# Patient Record
Sex: Female | Born: 1995
Health system: Southern US, Community
[De-identification: ages and names within clinical notes are randomized; demographics above are authoritative.]

## PROBLEM LIST (undated history)

## (undated) ENCOUNTER — Inpatient Hospital Stay (HOSPITAL_COMMUNITY): Payer: Self-pay

## (undated) DIAGNOSIS — G4730 Sleep apnea, unspecified: Secondary | ICD-10-CM

## (undated) DIAGNOSIS — F32A Depression, unspecified: Secondary | ICD-10-CM

## (undated) DIAGNOSIS — R519 Headache, unspecified: Secondary | ICD-10-CM

## (undated) DIAGNOSIS — T4145XA Adverse effect of unspecified anesthetic, initial encounter: Secondary | ICD-10-CM

## (undated) DIAGNOSIS — Z87442 Personal history of urinary calculi: Secondary | ICD-10-CM

## (undated) DIAGNOSIS — M797 Fibromyalgia: Secondary | ICD-10-CM

## (undated) DIAGNOSIS — E282 Polycystic ovarian syndrome: Secondary | ICD-10-CM

## (undated) DIAGNOSIS — M549 Dorsalgia, unspecified: Secondary | ICD-10-CM

## (undated) DIAGNOSIS — N939 Abnormal uterine and vaginal bleeding, unspecified: Secondary | ICD-10-CM

## (undated) DIAGNOSIS — T8859XA Other complications of anesthesia, initial encounter: Secondary | ICD-10-CM

## (undated) DIAGNOSIS — K5900 Constipation, unspecified: Secondary | ICD-10-CM

## (undated) DIAGNOSIS — R2232 Localized swelling, mass and lump, left upper limb: Secondary | ICD-10-CM

## (undated) DIAGNOSIS — N912 Amenorrhea, unspecified: Secondary | ICD-10-CM

## (undated) DIAGNOSIS — M2550 Pain in unspecified joint: Secondary | ICD-10-CM

## (undated) DIAGNOSIS — O139 Gestational [pregnancy-induced] hypertension without significant proteinuria, unspecified trimester: Secondary | ICD-10-CM

## (undated) DIAGNOSIS — R0602 Shortness of breath: Secondary | ICD-10-CM

## (undated) DIAGNOSIS — J45909 Unspecified asthma, uncomplicated: Secondary | ICD-10-CM

## (undated) DIAGNOSIS — D563 Thalassemia minor: Secondary | ICD-10-CM

## (undated) DIAGNOSIS — R079 Chest pain, unspecified: Secondary | ICD-10-CM

## (undated) DIAGNOSIS — F329 Major depressive disorder, single episode, unspecified: Secondary | ICD-10-CM

## (undated) DIAGNOSIS — I10 Essential (primary) hypertension: Secondary | ICD-10-CM

## (undated) DIAGNOSIS — R7303 Prediabetes: Secondary | ICD-10-CM

## (undated) DIAGNOSIS — K589 Irritable bowel syndrome without diarrhea: Secondary | ICD-10-CM

## (undated) DIAGNOSIS — D649 Anemia, unspecified: Secondary | ICD-10-CM

## (undated) DIAGNOSIS — B999 Unspecified infectious disease: Secondary | ICD-10-CM

## (undated) DIAGNOSIS — M722 Plantar fascial fibromatosis: Secondary | ICD-10-CM

## (undated) DIAGNOSIS — O24419 Gestational diabetes mellitus in pregnancy, unspecified control: Secondary | ICD-10-CM

## (undated) DIAGNOSIS — K219 Gastro-esophageal reflux disease without esophagitis: Secondary | ICD-10-CM

## (undated) DIAGNOSIS — Z6841 Body Mass Index (BMI) 40.0 and over, adult: Secondary | ICD-10-CM

## (undated) DIAGNOSIS — E119 Type 2 diabetes mellitus without complications: Secondary | ICD-10-CM

## (undated) HISTORY — DX: Chest pain, unspecified: R07.9

## (undated) HISTORY — DX: Dorsalgia, unspecified: M54.9

## (undated) HISTORY — DX: Gestational (pregnancy-induced) hypertension without significant proteinuria, unspecified trimester: O13.9

## (undated) HISTORY — DX: Polycystic ovarian syndrome: E28.2

## (undated) HISTORY — DX: Amenorrhea, unspecified: N91.2

## (undated) HISTORY — DX: Abnormal uterine and vaginal bleeding, unspecified: N93.9

## (undated) HISTORY — DX: Constipation, unspecified: K59.00

## (undated) HISTORY — PX: DILATION AND CURETTAGE OF UTERUS: SHX78

## (undated) HISTORY — DX: Thalassemia minor: D56.3

## (undated) HISTORY — DX: Essential (primary) hypertension: I10

## (undated) HISTORY — DX: Shortness of breath: R06.02

## (undated) HISTORY — PX: OTHER SURGICAL HISTORY: SHX169

## (undated) HISTORY — DX: Pain in unspecified joint: M25.50

---
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
## 2017-03-03 ENCOUNTER — Ambulatory Visit (INDEPENDENT_AMBULATORY_CARE_PROVIDER_SITE_OTHER): Payer: BLUE CROSS/BLUE SHIELD

## 2017-03-03 ENCOUNTER — Encounter (HOSPITAL_COMMUNITY): Payer: Self-pay | Admitting: Emergency Medicine

## 2017-03-03 ENCOUNTER — Ambulatory Visit (HOSPITAL_COMMUNITY)
Admission: EM | Admit: 2017-03-03 | Discharge: 2017-03-03 | Disposition: A | Discharge status: Home / Self Care | Payer: BLUE CROSS/BLUE SHIELD | Attending: Internal Medicine | Admitting: Internal Medicine

## 2017-03-03 DIAGNOSIS — M79671 Pain in right foot: Secondary | ICD-10-CM | POA: Diagnosis not present

## 2017-03-03 DIAGNOSIS — L720 Epidermal cyst: Secondary | ICD-10-CM | POA: Diagnosis not present

## 2017-03-03 DIAGNOSIS — M79672 Pain in left foot: Secondary | ICD-10-CM

## 2017-03-03 MED ORDER — DICLOFENAC SODIUM 75 MG PO TBEC: 75.0000 mg | DELAYED_RELEASE_TABLET | Freq: Two times a day (BID) | ORAL | 0 refills | Status: DC | PRN

## 2017-03-03 MED ORDER — DICLOFENAC SODIUM 1 % TD GEL: 2.0000 g | Freq: Four times a day (QID) | TRANSDERMAL | 0 refills | Status: DC | PRN

## 2017-03-03 NOTE — ED Provider Notes (Signed)
CSN: [id number]     Arrival date & time 03/03/17  1031 History   First MD Initiated Contact with Patient 03/03/17 1142     Chief Complaint  Patient presents with  . Hand Pain  . Foot Pain   (Consider location/radiation/quality/duration/timing/severity/associated sxs/prior Treatment) 21 year old female presents with 2 concerns.  1st concern is probable cyst? present on the tip of her left middle finger. The cyst has been present for many months but it has become larger in the past few weeks and she hit the end of her finger and caused the area to bleed and discharge to come out a few days ago. Now more painful and swollen.   2nd concern is bilateral heel pain that has become more severe in the past 3 weeks. She has had issues in the past with foot pain and has seen an Orthopedic- it was recommended she try special socks and support shoes and her pain was stable for many months. No particular injury but pain has gotten worse recently- now 8 to 9/10. She has tried Ibuprofen 800mg  and Tylenol with no relief. History of PCOS, GERD and allergies- on Claritin and Prilosec daily.    The history is provided by the patient.    Past Medical History:  Diagnosis Date  . Acid reflux   . Elevated blood sugar   . Polycystic ovary syndrome    History reviewed. No pertinent surgical history. Family History  Problem Relation Age of Onset  . Diabetes Maternal Aunt   . Hypertension Maternal Uncle   . Depression Maternal Grandfather   . Diabetes Maternal Grandfather   . Hypertension Maternal Grandfather    Social History  Substance Use Topics  . Smoking status: Never Smoker  . Smokeless tobacco: Never Used  . Alcohol use No   OB History    No data available     Review of Systems  Constitutional: Negative for appetite change, chills, fatigue and fever.  Respiratory: Negative for chest tightness, shortness of breath and wheezing.   Cardiovascular: Negative for chest pain.  Gastrointestinal:  Negative for abdominal pain, nausea and vomiting.  Genitourinary: Negative for difficulty urinating and dysuria.  Musculoskeletal: Positive for arthralgias and myalgias. Negative for back pain, joint swelling, neck pain and neck stiffness.  Skin: Positive for wound. Negative for rash.  Allergic/Immunologic: Positive for environmental allergies.  Neurological: Negative for dizziness, tremors, syncope, weakness, light-headedness, numbness and headaches.  Hematological: Negative for adenopathy.    Allergies  Pollen extract and Prunus persica  Home Medications   Prior to Admission medications   Medication Sig Start Date End Date Taking? Authorizing Provider  ibuprofen (ADVIL,MOTRIN) 600 MG tablet Take 1 tablet (600 mg total) by mouth every 6 (six) hours as needed for moderate pain. 10/30/16  Yes Melony Overly, MD  loratadine (CLARITIN) 10 MG tablet Take 1 tablet (10 mg total) by mouth daily. 07/08/16  Yes Luiz Blare Y, DO  omeprazole (PRILOSEC) 40 MG capsule TAKE ONE CAPSULE BY MOUTH  DAILY 10/16/15  Yes Luiz Blare Y, DO  ALPRAZolam Duanne Moron) 0.25 MG tablet Prn use for acute vertigo. 08/28/16   Dohmeier, Asencion Partridge, MD  diclofenac (VOLTAREN) 75 MG EC tablet Take 1 tablet (75 mg total) by mouth 2 (two) times daily as needed for moderate pain. 03/03/17   Katy Apo, NP  diclofenac sodium (VOLTAREN) 1 % GEL Apply 2 g topically 4 (four) times daily as needed (for pain). 03/03/17   Katy Apo, NP  dicyclomine (BENTYL)  20 MG tablet Take 20 mg by mouth every 6 (six) hours.    [provider]  polyethylene glycol (MIRALAX / GLYCOLAX) packet TAKE 17 G BY MOUTH TWICE DAILY. UNTIL STOOLING REGULARLY. HOLD FOR LOOSE STOOLS. 10/16/15   Katheren Shams, DO  topiramate (TOPAMAX) 25 MG tablet Take 1 tablet (25 mg total) by mouth 2 (two) times daily. 09/25/16   Dohmeier, Asencion Partridge, MD   Meds Ordered and Administered this Visit  Medications - No data to display  BP 133/82 (BP Location: Left Arm)  Comment (BP Location): large cuff  Pulse 83   Temp 97.9 F (36.6 C) (Oral)   Resp (!) 22   SpO2 97%  No data found.   Physical Exam  Constitutional: She is oriented to person, place, and time. She appears well-developed and well-nourished. No distress.  HENT:  Head: Normocephalic and atraumatic.  Right Ear: External ear normal.  Left Ear: External ear normal.  Mouth/Throat: Oropharynx is clear and moist.  Eyes: Conjunctivae and EOM are normal.  Neck: Normal range of motion. Neck supple.  Cardiovascular: Normal rate and regular rhythm.   Pulmonary/Chest: Effort normal.  Musculoskeletal: Normal range of motion. She exhibits edema and tenderness.       Left hand: She exhibits tenderness, deformity and swelling. She exhibits normal range of motion, no bony tenderness, normal two-point discrimination, normal capillary refill and no laceration. Normal sensation noted. Normal strength noted.       Hands:      Right foot: There is tenderness. There is normal range of motion, no bony tenderness, no swelling, normal capillary refill, no crepitus, no deformity and no laceration.       Left foot: There is tenderness. There is normal range of motion, no bony tenderness, no swelling, normal capillary refill, no crepitus, no deformity and no laceration.       Feet:  Has full range of motion of foot and ankle but pain with hyperextension around achilles tendon. Tender along mid-calculus area bilaterally. No distinct swelling or redness. Good pulses and capillary refill. No neuro deficits detected.   Left middle finger- hard, flesh to pink colored cyst present on tip of finger- about 10mm by 25mm. Has a central punctate area with no bleeding or discharge. Tender. No redness. No numbness or neuro deficits.    Lymphadenopathy:    She has no cervical adenopathy.  Neurological: She is alert and oriented to person, place, and time. She has normal strength. No sensory deficit.  Skin: Skin is warm. Capillary  refill takes less than 2 seconds.  Psychiatric: She has a normal mood and affect. Her behavior is normal. Judgment and thought content normal.    Urgent Care Course     Procedures (including critical care time)  Labs Review Labs Reviewed - No data to display  Imaging Review Dg Os Calcis Left  Result Date: 03/03/2017 CLINICAL DATA:  Patient states that she's had bilateral heel pain x 3 weeks. NKI EXAM: LEFT OS CALCIS - 2+ VIEW COMPARISON:  None. FINDINGS: There is no evidence of fracture or other focal bone lesions. Soft tissues are unremarkable. IMPRESSION: Negative. Electronically Signed   By: Franki Cabot M.D.   On: 03/03/2017 12:34   Dg Os Calcis Right  Result Date: 03/03/2017 CLINICAL DATA:  Patient states that she's had bilateral heel pain x 3 weeks. NKI EXAM: RIGHT OS CALCIS - 2+ VIEW COMPARISON:  None. FINDINGS: There is no evidence of fracture or other focal bone lesions. Soft tissues  are unremarkable. IMPRESSION: Negative. Electronically Signed   By: Franki Cabot M.D.   On: 03/03/2017 12:34     Visual Acuity Review  Right Eye Distance:   Left Eye Distance:   Bilateral Distance:    Right Eye Near:   Left Eye Near:    Bilateral Near:         MDM   1. Heel pain, bilateral   2. Epidermoid cyst of finger of left hand    Reviewed x-ray results with patient. No distinct heel spurs or fracture. Recommend trial Diclofenac gel- apply up to 4 times a day to both heels as needed. Or she may try oral Diclofenac 75mg  twice a day as needed for pain. Wear sneakers or other supportive shoes. Recommend call her foot doctor ([facility]) today to schedule appointment for further evaluation of foot pain. Discussed that cyst on finger needs further evaluation and biopsy. Applied finger splint to help protect end of finger from additional trauma. Recommend call Dermatologist today (multiple Dermatology centers provided) for further evaluation.     Katy Apo,  NP 03/04/17 1020

## 2017-03-03 NOTE — ED Triage Notes (Signed)
Patient has a cyst to tip of left middle finger, bump has a hole in tip not made by patient.  Bump fluctuates in size    Both heels hurt.  Pain increases throughout the day.  Reports using "sleeves, socks, copper socks" and no relief

## 2017-03-03 NOTE — Discharge Instructions (Signed)
May try Diclofenac gel- apply to both heels 4 times a day as needed. Or may also try Diclofenac 75mg  tablets every 12 hours as needed for pain. Call Dermatologist today for further evaluation of cyst on middle finger. Call Foot doctor today to schedule follow-up for heel pain.

## 2017-03-19 ENCOUNTER — Encounter: Payer: Self-pay | Admitting: Podiatry

## 2017-03-19 ENCOUNTER — Ambulatory Visit (INDEPENDENT_AMBULATORY_CARE_PROVIDER_SITE_OTHER): Payer: BLUE CROSS/BLUE SHIELD | Admitting: Podiatry

## 2017-03-19 ENCOUNTER — Ambulatory Visit (INDEPENDENT_AMBULATORY_CARE_PROVIDER_SITE_OTHER): Payer: BLUE CROSS/BLUE SHIELD

## 2017-03-19 ENCOUNTER — Ambulatory Visit: Payer: BLUE CROSS/BLUE SHIELD | Admitting: Podiatry

## 2017-03-19 VITALS — BP 124/78 | HR 86

## 2017-03-19 DIAGNOSIS — M79672 Pain in left foot: Secondary | ICD-10-CM

## 2017-03-19 DIAGNOSIS — M79671 Pain in right foot: Secondary | ICD-10-CM

## 2017-03-19 DIAGNOSIS — M722 Plantar fascial fibromatosis: Secondary | ICD-10-CM

## 2017-03-19 MED ORDER — TRIAMCINOLONE ACETONIDE 10 MG/ML IJ SUSP
10.0000 mg | Freq: Once | INTRAMUSCULAR | Status: AC
  Administered 2017-03-19: 10 mg

## 2017-03-19 MED ORDER — DICLOFENAC SODIUM 75 MG PO TBEC: 75.0000 mg | DELAYED_RELEASE_TABLET | Freq: Two times a day (BID) | ORAL | 2 refills | Status: DC

## 2017-03-19 NOTE — Progress Notes (Signed)
   Subjective:    Patient ID: Maureen Lewis, female    DOB: 1995-12-15, 21 y.o.   MRN: [id number]  HPI Chief Complaint  Patient presents with  . Foot Pain    SEEN AT URGENT CARE5/15/2018 TORN MUSCLE IN HEEL      Review of Systems  Gastrointestinal: Positive for diarrhea and vomiting.  Musculoskeletal: Positive for back pain and myalgias.       Objective:   Physical Exam        Assessment & Plan:

## 2017-03-19 NOTE — Progress Notes (Signed)
Subjective:    Patient ID: Maureen Lewis, female   DOB: 21 y.o.   MRN: [id number]   HPI patient presents stating she thinks she's torn muscle in both of her arches and her arch has been bothering her and her heel left over right for a long period of time    Review of Systems  All other systems reviewed and are negative.       Objective:  Physical Exam  Constitutional: She is oriented to person, place, and time.  Cardiovascular: Intact distal pulses.   Musculoskeletal: Normal range of motion.  Neurological: She is alert and oriented to person, place, and time.  Skin: Skin is warm.  Nursing note and vitals reviewed.  neurovascular status intact muscle strength adequate range of motion within normal limits with patient found to have significant foot structural issues with depression of the arch and exquisite discomfort in the left plantar fashion at the insertional point of the tendon calcaneus. Patient's found to have good digital perfusion and is well oriented 3     Assessment:    What appears to be acute plantar fasciitis secondary to foot structure with chronic history of this and obesity as, getting factor     Plan:    H&P x-ray reviewed and today I injected the plantar fascial left 3 mg Kenalog 5 mg Xylocaine and instructed on physical therapy anti-inflammatories and discussed long-term orthotics. Placed in fascially brace placed on diclofenac 75 mg twice a day and reappoint 2 weeks  X-rays indicate multiple signs of equinus with significant depression of the arch bilateral and no spur formation

## 2017-03-20 DIAGNOSIS — R2232 Localized swelling, mass and lump, left upper limb: Secondary | ICD-10-CM

## 2017-03-20 HISTORY — DX: Localized swelling, mass and lump, left upper limb: R22.32

## 2017-04-01 ENCOUNTER — Other Ambulatory Visit: Payer: Self-pay | Admitting: Orthopedic Surgery

## 2017-04-02 ENCOUNTER — Encounter: Payer: Self-pay | Admitting: Podiatry

## 2017-04-02 ENCOUNTER — Ambulatory Visit (INDEPENDENT_AMBULATORY_CARE_PROVIDER_SITE_OTHER): Payer: BLUE CROSS/BLUE SHIELD | Admitting: Podiatry

## 2017-04-02 DIAGNOSIS — M722 Plantar fascial fibromatosis: Secondary | ICD-10-CM | POA: Diagnosis not present

## 2017-04-03 NOTE — Progress Notes (Signed)
Subjective:    Patient ID: Maureen Lewis, female   DOB: 21 y.o.   MRN: [id number]   HPI patient states the pain is improved but still present in the heels with obesity and flatfeet discomfort getting factor    ROS      Objective:  Physical Exam neurovascular status intact negative Homan sign was noted with patient found to have significant flattening of the arch bilateral with inflammation and pain around the medial band of both heels     Assessment:    Plantar fasciitis bilateral with inflammation fluid buildup still noted     Plan:    Reviewed case with head orthotist and at this point I have recommended a customized orthotic to try to reduce stress against the feet and hold the arch is up in a more proper fashion. Patient will be seen back to recheck for dispensed after casting and scanning done today

## 2017-04-14 ENCOUNTER — Encounter (HOSPITAL_BASED_OUTPATIENT_CLINIC_OR_DEPARTMENT_OTHER): Payer: Self-pay | Admitting: *Deleted

## 2017-04-14 NOTE — Pre-Procedure Instructions (Signed)
To come for anesthesia airway evaluation

## 2017-04-15 NOTE — Progress Notes (Signed)
Pt in for Anesthesia consult.Marland Kitchen BMI today 52 with updated weight.  Pt may come for surgery on 7/3 to [facility].per Dr Gifford Shave.

## 2017-04-21 ENCOUNTER — Ambulatory Visit (HOSPITAL_BASED_OUTPATIENT_CLINIC_OR_DEPARTMENT_OTHER): Payer: BLUE CROSS/BLUE SHIELD | Admitting: Anesthesiology

## 2017-04-21 ENCOUNTER — Encounter (HOSPITAL_BASED_OUTPATIENT_CLINIC_OR_DEPARTMENT_OTHER)
Admission: RE | Disposition: A | Discharge status: Home / Self Care | Payer: Self-pay | Source: Ambulatory Visit | Attending: Orthopedic Surgery

## 2017-04-21 ENCOUNTER — Ambulatory Visit (HOSPITAL_BASED_OUTPATIENT_CLINIC_OR_DEPARTMENT_OTHER)
Admission: RE | Admit: 2017-04-21 | Discharge: 2017-04-21 | Disposition: A | Discharge status: Home / Self Care | Payer: BLUE CROSS/BLUE SHIELD | Source: Ambulatory Visit | Attending: Orthopedic Surgery | Admitting: Orthopedic Surgery

## 2017-04-21 ENCOUNTER — Encounter (HOSPITAL_BASED_OUTPATIENT_CLINIC_OR_DEPARTMENT_OTHER): Payer: Self-pay | Admitting: *Deleted

## 2017-04-21 DIAGNOSIS — Z6841 Body Mass Index (BMI) 40.0 and over, adult: Secondary | ICD-10-CM | POA: Diagnosis not present

## 2017-04-21 DIAGNOSIS — K219 Gastro-esophageal reflux disease without esophagitis: Secondary | ICD-10-CM | POA: Diagnosis not present

## 2017-04-21 DIAGNOSIS — K589 Irritable bowel syndrome without diarrhea: Secondary | ICD-10-CM | POA: Insufficient documentation

## 2017-04-21 DIAGNOSIS — D481 Neoplasm of uncertain behavior of connective and other soft tissue: Secondary | ICD-10-CM | POA: Diagnosis not present

## 2017-04-21 DIAGNOSIS — G4730 Sleep apnea, unspecified: Secondary | ICD-10-CM | POA: Diagnosis not present

## 2017-04-21 DIAGNOSIS — Z79899 Other long term (current) drug therapy: Secondary | ICD-10-CM | POA: Diagnosis not present

## 2017-04-21 DIAGNOSIS — E282 Polycystic ovarian syndrome: Secondary | ICD-10-CM | POA: Diagnosis not present

## 2017-04-21 HISTORY — DX: Morbid (severe) obesity due to excess calories: E66.01

## 2017-04-21 HISTORY — DX: Anemia, unspecified: D64.9

## 2017-04-21 HISTORY — DX: Other complications of anesthesia, initial encounter: T88.59XA

## 2017-04-21 HISTORY — PX: EXCISION MASS UPPER EXTREMETIES: SHX6704

## 2017-04-21 HISTORY — DX: Plantar fascial fibromatosis: M72.2

## 2017-04-21 HISTORY — DX: Body Mass Index (BMI) 40.0 and over, adult: Z684

## 2017-04-21 HISTORY — DX: Adverse effect of unspecified anesthetic, initial encounter: T41.45XA

## 2017-04-21 HISTORY — DX: Irritable bowel syndrome, unspecified: K58.9

## 2017-04-21 HISTORY — DX: Sleep apnea, unspecified: G47.30

## 2017-04-21 HISTORY — DX: Localized swelling, mass and lump, left upper limb: R22.32

## 2017-04-21 SURGERY — EXCISION MASS UPPER EXTREMITIES
Anesthesia: Monitor Anesthesia Care | Site: Finger | Laterality: Left

## 2017-04-21 MED ORDER — FENTANYL CITRATE (PF) 100 MCG/2ML IJ SOLN
INTRAMUSCULAR | Status: AC
  Filled 2017-04-21: qty 2

## 2017-04-21 MED ORDER — BUPIVACAINE HCL (PF) 0.25 % IJ SOLN
INTRAMUSCULAR | Status: DC | PRN
  Administered 2017-04-21: 8 mL

## 2017-04-21 MED ORDER — BUPIVACAINE HCL (PF) 0.25 % IJ SOLN
INTRAMUSCULAR | Status: AC
  Filled 2017-04-21: qty 30

## 2017-04-21 MED ORDER — MIDAZOLAM HCL 2 MG/2ML IJ SOLN
1.0000 mg | INTRAMUSCULAR | Status: DC | PRN
  Administered 2017-04-21: 1 mg via INTRAVENOUS

## 2017-04-21 MED ORDER — PROPOFOL 500 MG/50ML IV EMUL
INTRAVENOUS | Status: DC | PRN
  Administered 2017-04-21: 75 ug/kg/min via INTRAVENOUS

## 2017-04-21 MED ORDER — ONDANSETRON HCL 4 MG/2ML IJ SOLN: 4.0000 mg | Freq: Once | INTRAMUSCULAR | Status: DC | PRN

## 2017-04-21 MED ORDER — CEFAZOLIN SODIUM-DEXTROSE 1-4 GM/50ML-% IV SOLN
INTRAVENOUS | Status: AC
  Filled 2017-04-21: qty 50

## 2017-04-21 MED ORDER — MIDAZOLAM HCL 2 MG/2ML IJ SOLN
INTRAMUSCULAR | Status: AC
  Filled 2017-04-21: qty 2

## 2017-04-21 MED ORDER — CHLORHEXIDINE GLUCONATE 4 % EX LIQD: 60.0000 mL | Freq: Once | CUTANEOUS | Status: DC

## 2017-04-21 MED ORDER — ONDANSETRON HCL 4 MG/2ML IJ SOLN
INTRAMUSCULAR | Status: AC
  Filled 2017-04-21: qty 2

## 2017-04-21 MED ORDER — HYDROCODONE-ACETAMINOPHEN 5-325 MG PO TABS: 1.0000 | ORAL_TABLET | Freq: Four times a day (QID) | ORAL | 0 refills | Status: DC | PRN

## 2017-04-21 MED ORDER — MEPERIDINE HCL 25 MG/ML IJ SOLN: 6.2500 mg | INTRAMUSCULAR | Status: DC | PRN

## 2017-04-21 MED ORDER — SCOPOLAMINE 1 MG/3DAYS TD PT72: 1.0000 | MEDICATED_PATCH | Freq: Once | TRANSDERMAL | Status: DC | PRN

## 2017-04-21 MED ORDER — ONDANSETRON HCL 4 MG/2ML IJ SOLN
INTRAMUSCULAR | Status: DC | PRN
  Administered 2017-04-21: 4 mg via INTRAVENOUS

## 2017-04-21 MED ORDER — FENTANYL CITRATE (PF) 100 MCG/2ML IJ SOLN
50.0000 ug | INTRAMUSCULAR | Status: DC | PRN
  Administered 2017-04-21: 50 ug via INTRAVENOUS

## 2017-04-21 MED ORDER — CEFAZOLIN SODIUM-DEXTROSE 2-4 GM/100ML-% IV SOLN
INTRAVENOUS | Status: AC
  Filled 2017-04-21: qty 100

## 2017-04-21 MED ORDER — FENTANYL CITRATE (PF) 100 MCG/2ML IJ SOLN: 25.0000 ug | INTRAMUSCULAR | Status: DC | PRN

## 2017-04-21 MED ORDER — LACTATED RINGERS IV SOLN
INTRAVENOUS | Status: DC
  Administered 2017-04-21: 08:00:00 via INTRAVENOUS

## 2017-04-21 MED ORDER — LIDOCAINE HCL (PF) 0.5 % IJ SOLN
INTRAMUSCULAR | Status: DC | PRN
  Administered 2017-04-21: 30 mL via INTRAVENOUS

## 2017-04-21 MED ORDER — CEFAZOLIN SODIUM-DEXTROSE 2-4 GM/100ML-% IV SOLN
2.0000 g | INTRAVENOUS | Status: AC
  Administered 2017-04-21: 3 g via INTRAVENOUS

## 2017-04-21 SURGICAL SUPPLY — 44 items
BANDAGE COBAN STERILE 2 (GAUZE/BANDAGES/DRESSINGS) IMPLANT
BLADE SURG 15 STRL LF DISP TIS (BLADE) ×1 IMPLANT
BLADE SURG 15 STRL SS (BLADE) ×2
BNDG CMPR 9X4 STRL LF SNTH (GAUZE/BANDAGES/DRESSINGS)
BNDG COHESIVE 1X5 TAN STRL LF (GAUZE/BANDAGES/DRESSINGS) ×2 IMPLANT
BNDG COHESIVE 3X5 TAN STRL LF (GAUZE/BANDAGES/DRESSINGS) IMPLANT
BNDG ESMARK 4X9 LF (GAUZE/BANDAGES/DRESSINGS) IMPLANT
BNDG GAUZE ELAST 4 BULKY (GAUZE/BANDAGES/DRESSINGS) IMPLANT
CHLORAPREP W/TINT 26ML (MISCELLANEOUS) ×2 IMPLANT
CORD BIPOLAR FORCEPS 12FT (ELECTRODE) ×2 IMPLANT
COVER BACK TABLE 60X90IN (DRAPES) ×2 IMPLANT
COVER MAYO STAND STRL (DRAPES) ×2 IMPLANT
CUFF TOURNIQUET SINGLE 18IN (TOURNIQUET CUFF) ×2 IMPLANT
DECANTER SPIKE VIAL GLASS SM (MISCELLANEOUS) IMPLANT
DRAIN PENROSE 1/2X12 LTX STRL (WOUND CARE) IMPLANT
DRAPE EXTREMITY T 121X128X90 (DRAPE) ×2 IMPLANT
DRAPE SURG 17X23 STRL (DRAPES) ×2 IMPLANT
GAUZE SPONGE 4X4 12PLY STRL (GAUZE/BANDAGES/DRESSINGS) ×2 IMPLANT
GAUZE XEROFORM 1X8 LF (GAUZE/BANDAGES/DRESSINGS) ×2 IMPLANT
GLOVE BIO SURGEON STRL SZ 6.5 (GLOVE) ×2 IMPLANT
GLOVE BIOGEL PI IND STRL 8.5 (GLOVE) ×1 IMPLANT
GLOVE BIOGEL PI INDICATOR 8.5 (GLOVE) ×1
GLOVE SURG ORTHO 8.0 STRL STRW (GLOVE) ×2 IMPLANT
GOWN STRL REUS W/ TWL LRG LVL3 (GOWN DISPOSABLE) ×1 IMPLANT
GOWN STRL REUS W/TWL LRG LVL3 (GOWN DISPOSABLE) ×1
GOWN STRL REUS W/TWL XL LVL3 (GOWN DISPOSABLE) ×2 IMPLANT
NDL SAFETY ECLIPSE 18X1.5 (NEEDLE) IMPLANT
NEEDLE HYPO 18GX1.5 SHARP (NEEDLE)
NEEDLE PRECISIONGLIDE 27X1.5 (NEEDLE) ×2 IMPLANT
NS IRRIG 1000ML POUR BTL (IV SOLUTION) ×2 IMPLANT
PACK BASIN DAY SURGERY FS (CUSTOM PROCEDURE TRAY) ×2 IMPLANT
PAD CAST 3X4 CTTN HI CHSV (CAST SUPPLIES) IMPLANT
PADDING CAST COTTON 3X4 STRL (CAST SUPPLIES)
SPLINT FINGER 4.25 BULB 911906 (SOFTGOODS) ×2 IMPLANT
SPLINT PLASTER CAST XFAST 3X15 (CAST SUPPLIES) IMPLANT
SPLINT PLASTER XTRA FASTSET 3X (CAST SUPPLIES)
STOCKINETTE 4X48 STRL (DRAPES) ×2 IMPLANT
SUT CHROMIC 4 0 P 3 18 (SUTURE) ×2 IMPLANT
SUT ETHILON 4 0 PS 2 18 (SUTURE) ×2 IMPLANT
SUT VIC AB 4-0 P2 18 (SUTURE) IMPLANT
SYR BULB 3OZ (MISCELLANEOUS) ×2 IMPLANT
SYR CONTROL 10ML LL (SYRINGE) ×2 IMPLANT
TOWEL OR 17X24 6PK STRL BLUE (TOWEL DISPOSABLE) ×2 IMPLANT
UNDERPAD 30X30 (UNDERPADS AND DIAPERS) IMPLANT

## 2017-04-21 NOTE — Anesthesia Procedure Notes (Signed)
Procedure Name: MAC Date/Time: 04/21/2017 8:52 AM Performed by: Lyndee Leo Pre-anesthesia Checklist: Patient identified, Emergency Drugs available, Suction available, Patient being monitored and Timeout performed Patient Re-evaluated:Patient Re-evaluated prior to inductionOxygen Delivery Method: Simple face mask

## 2017-04-21 NOTE — H&P (Signed)
  Maureen Lewis is an 21 y.o. female.   Chief Complaint: mass left middle finger HPI: Maureen Lewis is a an 21 year old right-hand-dominant female who comes in with a complaint of a mass in the tip of her left middle finger this been present for approximately 2 months. She states that she hit it it opened up approximately 2 weeks ago draining a yellow type fluid she has no prior history of injury. She complains of a throbbing aching type pain with a VAS score 6/10. She has not had any treatment for this. She recalls no history of prior injury. There is no radiation of discomfort. She has no history diabetes thyroid problems arthritis gout. Family history is positive diabetes and arthritis negative for thyroid problems and gout. She has been tested for diabetes.      Past Medical History:  Diagnosis Date  . Acid reflux   . Anemia    no current med.  . Complication of anesthesia    states had to keep giving her anesthesia during EGD  . Finger mass, left 03/2017   middle finger  . Irritable bowel syndrome (IBS)   . Morbid obesity with body mass index (BMI) of 45.0 to 49.9 in adult [facility])   . Plantar fasciitis, bilateral   . Polycystic ovary syndrome   . Sleep apnea    no CPAP use    Past Surgical History:  Procedure Laterality Date  . COLONOSCOPY WITH PROPOFOL  10/07/2016  . ESOPHAGOGASTRODUODENOSCOPY  12/21/2015    Family History  Problem Relation Age of Onset  . Diabetes Maternal Aunt   . Hypertension Maternal Uncle   . Depression Maternal Grandfather   . Diabetes Maternal Grandfather   . Hypertension Maternal Grandfather    Social History:  reports that she has never smoked. She has never used smokeless tobacco. She reports that she does not drink alcohol or use drugs.  Allergies:  Allergies  Allergen Reactions  . Peach Flavor Hives and Shortness Of Breath    SWELLING OF MOUTH  . Pollen Extract Hives, Shortness Of Breath and Swelling    No prescriptions prior to admission.     No results found for this or any previous visit (from the past 48 hour(s)).  No results found.   Pertinent items are noted in HPI.  Height 6' 2.5" (1.892 m), weight (!) 186.4 kg (411 lb), last menstrual period 03/26/2017.  General appearance: alert, cooperative and appears stated age Head: Normocephalic, without obvious abnormality Neck: no JVD Resp: clear to auscultation bilaterally Cardio: regular rate and rhythm, S1, S2 normal, no murmur, click, rub or gallop GI: soft, non-tender; bowel sounds normal; no masses,  no organomegaly Extremities: mass left middle finger Pulses: 2+ and symmetric Skin: Skin color, texture, turgor normal. No rashes or lesions Neurologic: Grossly normal Incision/Wound: na  Assessment/Plan Assessment:  1. Mass  Left Middle   Plan: We recommend surgical excision. Prepare postoperative course are discussed along with risks and complications. She is where there is no guarantee to the surgery the possibility of infection recurrence injury to arteries nerves tendons complete relief symptoms dystrophy. She is advised possibility of some numbness and tingling about the scar. Questions are encouraged and answered. She and her mother would like to proceed to have this excised. This to be scheduled as an outpatient under regional anesthesia      Tamario Heal R 04/21/2017, 5:09 AM

## 2017-04-21 NOTE — Brief Op Note (Signed)
04/21/2017  9:19 AM  PATIENT:  Maureen Lewis  21 y.o. female  PRE-OPERATIVE DIAGNOSIS:  MASS LEFT MIDDLE FINGER R22.9  POST-OPERATIVE DIAGNOSIS:  MASS LEFT MIDDLE FINGER R22.9  PROCEDURE:  Procedure(s): EXCISION MASS LEFT MIDDLE FINGER (Left)  SURGEON:  Surgeon(s) and Role:    * Daryll Brod, MD - Primary  PHYSICIAN ASSISTANT:   ASSISTANTS: none   ANESTHESIA:   local and regional  EBL:  Total I/O In: 300 [I.V.:300] Out: -   BLOOD ADMINISTERED:none  DRAINS: none   LOCAL MEDICATIONS USED:  BUPIVICAINE   SPECIMEN:  Excision  DISPOSITION OF SPECIMEN:  PATHOLOGY  COUNTS:  YES  TOURNIQUET:   Total Tourniquet Time Documented: Forearm (Left) - 23 minutes Total: Forearm (Left) - 23 minutes   DICTATION: .Other Dictation: Dictation Number [id number]  PLAN OF CARE: Discharge to home after PACU  PATIENT DISPOSITION:  PACU - hemodynamically stable.

## 2017-04-21 NOTE — Op Note (Signed)
Maureen, Lewis                 ACCOUNT NO.:  [id number]  MEDICAL RECORD NO.:  [id number]  LOCATION:                                 FACILITY:  PHYSICIAN:  Daryll Brod, M.D.            DATE OF BIRTH:  DATE OF PROCEDURE:  04/21/2017 DATE OF DISCHARGE:                              OPERATIVE REPORT   PREOPERATIVE DIAGNOSIS:  Mass, left middle finger.  POSTOPERATIVE DIAGNOSIS:  Mass, left middle finger.  OPERATION:  Excisional biopsy mass, left middle finger.  SURGEON:  Daryll Brod, MD.  ANESTHESIA:  Forearm-based IV regional with metacarpal block.  PLACE OF SURGERY:  [facility].  HISTORY:  The patient is an 21 year old female with a history of a mass at the tip of her left middle finger.  This has enlarged, has drained on one occasion.  She has elected to undergo surgical excision. Pre, peri, and postoperative course have been discussed along with risks and complications.  She is aware that there is no guarantee to the surgery, the possibility of infection; recurrence of injury to arteries, nerves, tendons; incomplete relief of symptoms; dystrophy, and possibility of recurrence.  In the preoperative area, the patient was seen, the extremity was marked by both the patient and surgeon.  Antibiotic is given.  PROCEDURE IN DETAIL:  The patient was brought to the operating room, where a forearm-based IV regional anesthetic was carried out without difficulty under the direction of the Anesthesia Department.  She was prepped using ChloraPrep in a supine position with the left arm free.  A 3-minute dry time was allowed.  Time-out was taken confirming the patient and procedure.  A transverse incision was made directly over the mass, carried down through subcutaneous tissue.  Bleeders were electrocauterized with bipolar as necessary.  A multilobulated yellow tan mass was immediately encountered.  With blunt and sharp dissection, this was dissected free and sent to  Pathology.  It measured approximately 1.1 cm in diameter.  This resulted in a significant area of excess skin.  An elliptical incision was then made removing the distal portion of it.  The wound was copiously irrigated with saline. The volar tissue was then brought dorsally and sutured into position with interrupted 4-0 chromic sutures.  A metacarpal block was given at the beginning of the procedure.  A 9 mL of 0.25% bupivacaine without epinephrine was given.  A sterile compressive dressing and splint to the finger was applied.  On deflation of the tourniquet, remaining fingers pinked.  She was taken to the recovery room for observation in satisfactory condition.  She will be discharged to home to return to [place] in 1 week, on [place].          ______________________________ Daryll Brod, M.D.     GK/MEDQ  D:  04/21/2017  T:  04/21/2017  Job:  [id number]

## 2017-04-21 NOTE — Transfer of Care (Signed)
Immediate Anesthesia Transfer of Care Note  Patient: Maureen Lewis  Procedure(s) Performed: Procedure(s): EXCISION MASS LEFT MIDDLE FINGER (Left)  Patient Location: PACU  Anesthesia Type:Bier block  Level of Consciousness: awake, alert  and oriented  Airway & Oxygen Therapy: Patient Spontanous Breathing and Patient connected to face mask oxygen  Post-op Assessment: Report given to RN and Post -op Vital signs reviewed and stable  Post vital signs: Reviewed and stable  Last Vitals:  Vitals:   04/21/17 0731  BP: 117/64  Pulse: 99  Resp: 20  Temp: 36.6 C    Last Pain:  Vitals:   04/21/17 0731  TempSrc: Oral         Complications: No apparent anesthesia complications

## 2017-04-21 NOTE — Discharge Instructions (Addendum)

## 2017-04-21 NOTE — Anesthesia Preprocedure Evaluation (Signed)
Anesthesia Evaluation  Patient identified by MRN, date of birth, ID band Patient awake    Reviewed: Allergy & Precautions, NPO status , Patient's Chart, lab work & pertinent test results  Airway Mallampati: III  TM Distance: >3 FB Neck ROM: Full    Dental no notable dental hx.    Pulmonary sleep apnea ,    Pulmonary exam normal breath sounds clear to auscultation       Cardiovascular negative cardio ROS Normal cardiovascular exam Rhythm:Regular Rate:Normal     Neuro/Psych negative neurological ROS  negative psych ROS   GI/Hepatic Neg liver ROS, GERD  Medicated,  Endo/Other  Morbid obesity  Renal/GU negative Renal ROS  negative genitourinary   Musculoskeletal negative musculoskeletal ROS (+)   Abdominal   Peds negative pediatric ROS (+)  Hematology negative hematology ROS (+)   Anesthesia Other Findings   Reproductive/Obstetrics negative OB ROS                             Anesthesia Physical Anesthesia Plan  ASA: III  Anesthesia Plan: Bier Block and MAC   Post-op Pain Management:    Induction: Intravenous  PONV Risk Score and Plan: 2 and Ondansetron and Dexamethasone  Airway Management Planned: Natural Airway and Nasal Cannula  Additional Equipment:   Intra-op Plan:   Post-operative Plan:   Informed Consent: I have reviewed the patients History and Physical, chart, labs and discussed the procedure including the risks, benefits and alternatives for the proposed anesthesia with the patient or authorized representative who has indicated his/her understanding and acceptance.   Dental advisory given  Plan Discussed with:   Anesthesia Plan Comments:         Anesthesia Quick Evaluation

## 2017-04-21 NOTE — Anesthesia Procedure Notes (Signed)
Anesthesia Regional Block: Bier block (IV Regional)   Pre-Anesthetic Checklist: ,, timeout performed, Correct Patient, Correct Site, Correct Laterality, Correct Procedure,, site marked, surgical consent,, at surgeon's request  Laterality: Left     Needles:  Injection technique: Single-shot  Needle Type: Other      Needle Gauge: 22     Additional Needles:   Procedures:,,,,,,, Esmarch exsanguination, single tourniquet utilized,  Narrative:   Performed by: Personally       

## 2017-04-21 NOTE — Op Note (Signed)
Dictation Number [id number]

## 2017-04-21 NOTE — Anesthesia Postprocedure Evaluation (Signed)
Anesthesia Post Note  Patient: Maureen Lewis  Procedure(s) Performed: Procedure(s) (LRB): EXCISION MASS LEFT MIDDLE FINGER (Left)     Patient location during evaluation: PACU Anesthesia Type: MAC Level of consciousness: awake and alert Pain management: pain level controlled Vital Signs Assessment: post-procedure vital signs reviewed and stable Respiratory status: spontaneous breathing, nonlabored ventilation, respiratory function stable and patient connected to nasal cannula oxygen Cardiovascular status: stable and blood pressure returned to baseline Anesthetic complications: no    Last Vitals:  Vitals:   04/21/17 0928 04/21/17 0947  BP:  115/78  Pulse: 80 78  Resp: 18 (!) 22  Temp:  36.6 C    Last Pain:  Vitals:   04/21/17 0947  TempSrc:   PainSc: 0-No pain                 Kelcey Korus

## 2017-04-23 ENCOUNTER — Encounter (HOSPITAL_BASED_OUTPATIENT_CLINIC_OR_DEPARTMENT_OTHER): Payer: Self-pay | Admitting: Orthopedic Surgery

## 2017-04-23 ENCOUNTER — Ambulatory Visit (INDEPENDENT_AMBULATORY_CARE_PROVIDER_SITE_OTHER): Payer: BLUE CROSS/BLUE SHIELD | Admitting: Podiatry

## 2017-04-23 DIAGNOSIS — M722 Plantar fascial fibromatosis: Secondary | ICD-10-CM

## 2017-04-23 NOTE — Progress Notes (Signed)
Patient came in today to pick up custom made foot orthotics.  The goals were accomplished and the patient reported no dissatisfaction with said orthotics.  Patient was advised of breakin period and how to report any issues. 

## 2017-04-24 ENCOUNTER — Ambulatory Visit (INDEPENDENT_AMBULATORY_CARE_PROVIDER_SITE_OTHER): Payer: BLUE CROSS/BLUE SHIELD | Admitting: Adult Health

## 2017-04-24 ENCOUNTER — Encounter: Payer: Self-pay | Admitting: Adult Health

## 2017-04-24 VITALS — BP 122/68 | Temp 98.1°F | Ht 74.5 in | Wt >= 6400 oz

## 2017-04-24 DIAGNOSIS — Z6841 Body Mass Index (BMI) 40.0 and over, adult: Secondary | ICD-10-CM

## 2017-04-24 DIAGNOSIS — IMO0001 Reserved for inherently not codable concepts without codable children: Secondary | ICD-10-CM

## 2017-04-24 DIAGNOSIS — E669 Obesity, unspecified: Secondary | ICD-10-CM

## 2017-04-24 DIAGNOSIS — Z7689 Persons encountering health services in other specified circumstances: Secondary | ICD-10-CM | POA: Diagnosis not present

## 2017-04-24 NOTE — Patient Instructions (Signed)
It was great seeing you today   I would be happy to help you with your weight loss.   Over the next 1-2 months please work on diet and any form of exercise  One diet that is good is the Keto diet - there are plenty of meal plans online   Download the My Fitness tracker App and start logging your meals.   Simple steps first ..Marland KitchenMarland Kitchen

## 2017-04-24 NOTE — Progress Notes (Signed)
Patient presents to clinic today to establish care. She is a pleasant 21 year old female who  has a past medical history of Acid reflux; Anemia; Complication of anesthesia; Finger mass, left (03/2017); Irritable bowel syndrome (IBS); Morbid obesity with body mass index (BMI) of 45.0 to 49.9 in adult [facility]); Plantar fasciitis, bilateral; Polycystic ovary syndrome; and Sleep apnea.   Acute Concerns: Establish Care   Chronic Issues: Morbid Obesity - she has tried different diets in the past but nothing has worked for her in the past. She feels as though the birth control she is on has made her gain weight. She was 380 prior to birth control. She does not exercise and does not go out of her house often   PCOS - is followed by GYN   Health Maintenance: Dental -- Does not do routine care Vision -- Does not do routine care  Immunizations -- UTD  PAP -- Feb 2018  Colonoscopy - 2018 due to IBS   She is followed by   GI - [facility]  Neurology - [facility]  Dermatology  GYN   Past Medical History:  Diagnosis Date  . Acid reflux   . Anemia    no current med.  . Complication of anesthesia    states had to keep giving her anesthesia during EGD  . Finger mass, left 03/2017   middle finger  . Irritable bowel syndrome (IBS)   . Morbid obesity with body mass index (BMI) of 45.0 to 49.9 in adult [facility])   . Plantar fasciitis, bilateral   . Polycystic ovary syndrome   . Sleep apnea    no CPAP use    Past Surgical History:  Procedure Laterality Date  . COLONOSCOPY WITH PROPOFOL  10/07/2016  . ESOPHAGOGASTRODUODENOSCOPY  12/21/2015  . EXCISION MASS UPPER EXTREMETIES Left 04/21/2017   Procedure: EXCISION MASS LEFT MIDDLE FINGER;  Surgeon: Daryll Brod, MD;  Location: [facility];  Service: Orthopedics;  Laterality: Left;    Current Outpatient Prescriptions on File Prior to Visit  Medication Sig Dispense Refill  . diclofenac (VOLTAREN)  75 MG EC tablet Take 1 tablet (75 mg total) by mouth 2 (two) times daily as needed for moderate pain. 20 tablet 0  . diclofenac sodium (VOLTAREN) 1 % GEL Apply 2 g topically 4 (four) times daily as needed (for pain). 100 g 0  . dicyclomine (BENTYL) 20 MG tablet Take 20 mg by mouth every 6 (six) hours.    Marland Kitchen HYDROcodone-acetaminophen (NORCO) 5-325 MG tablet Take 1 tablet by mouth every 6 (six) hours as needed. 20 tablet 0  . Ibuprofen-Famotidine 800-26.6 MG TABS Take by mouth.    . loratadine (CLARITIN) 10 MG tablet Take 10 mg by mouth daily.    . meloxicam (MOBIC) 15 MG tablet Take 15 mg by mouth daily.    Marland Kitchen omeprazole (PRILOSEC) 40 MG capsule TAKE ONE CAPSULE BY MOUTH  DAILY 30 capsule 3  . polyethylene glycol (MIRALAX / GLYCOLAX) packet Take 17 g by mouth daily.    Marland Kitchen triamcinolone cream (KENALOG) 0.1 % Apply 1 application topically 2 (two) times daily.     No current facility-administered medications on file prior to visit.     Allergies  Allergen Reactions  . Peach Flavor Hives and Shortness Of Breath    SWELLING OF MOUTH  . Pollen Extract Hives, Shortness Of Breath and Swelling    Family History  Problem Relation Age of Onset  .  Diabetes Maternal Aunt   . Hypertension Maternal Uncle   . Depression Maternal Grandfather   . Diabetes Maternal Grandfather   . Hypertension Maternal Grandfather     Social History   Social History  . Marital status: Single    Spouse name: N/A  . Number of children: N/A  . Years of education: N/A   Occupational History  . Not on file.   Social History Main Topics  . Smoking status: Never Smoker  . Smokeless tobacco: Never Used  . Alcohol use No  . Drug use: No  . Sexual activity: No   Other Topics Concern  . Not on file   Social History Narrative   Lives with mom and cousin, a grandmother in [place] also helps care for her.     Review of Systems  Constitutional: Negative.   HENT: Negative.   Eyes: Negative.   Respiratory:  Negative.   Cardiovascular: Negative.   Gastrointestinal: Negative.   Genitourinary: Negative.   Musculoskeletal: Negative.   Skin: Negative.   Neurological: Negative.   Endo/Heme/Allergies: Negative.   Psychiatric/Behavioral: Negative.   All other systems reviewed and are negative.   BP 122/68 (BP Location: Left Arm, Patient Position: Sitting, Cuff Size: Normal)   Temp 98.1 F (36.7 C) (Oral)   Ht 6' 2.5" (1.892 m)   Wt (!) 419 lb 6.4 oz (190.2 kg)   LMP 03/26/2017 (Approximate) Comment: "no chance pregnant" per pt  BMI 53.13 kg/m   Physical Exam  Constitutional: She is oriented to person, place, and time and well-developed, well-nourished, and in no distress. No distress.  Morbidly Obese   Cardiovascular: Normal rate, regular rhythm, normal heart sounds and intact distal pulses.  Exam reveals no gallop and no friction rub.   No murmur heard. Pulmonary/Chest: Effort normal and breath sounds normal. No respiratory distress. She has no wheezes. She has no rales. She exhibits no tenderness.  Neurological: She is alert and oriented to person, place, and time. Gait normal. GCS score is 15.  Skin: Skin is warm and dry. No rash noted. She is not diaphoretic. No erythema. No pallor.  Psychiatric: Mood, memory, affect and judgment normal.  Nursing note and vitals reviewed.  Assessment/Plan:  1. Encounter to establish care - Follow up for CPE   2. Class 3 obesity with serious comorbidity and body mass index (BMI) of 50.0 to 59.9 in adult, unspecified obesity type ([place]) - Follow up in one month to start working on obesity  - I would like her to start the [place]  - Start walking or using an exercise band  - Consider medication therapy in the past  - I would like her to start a food diary using My Fitness Pal App   Dorothyann Peng, NP

## 2017-05-06 DIAGNOSIS — R229 Localized swelling, mass and lump, unspecified: Secondary | ICD-10-CM

## 2017-05-06 DIAGNOSIS — IMO0002 Reserved for concepts with insufficient information to code with codable children: Secondary | ICD-10-CM | POA: Insufficient documentation

## 2017-05-22 ENCOUNTER — Encounter: Payer: Self-pay | Admitting: Family Medicine

## 2017-05-28 ENCOUNTER — Ambulatory Visit: Payer: BLUE CROSS/BLUE SHIELD | Admitting: Adult Health

## 2017-05-28 ENCOUNTER — Ambulatory Visit (INDEPENDENT_AMBULATORY_CARE_PROVIDER_SITE_OTHER): Payer: BLUE CROSS/BLUE SHIELD | Admitting: Adult Health

## 2017-05-28 ENCOUNTER — Encounter: Payer: Self-pay | Admitting: Adult Health

## 2017-05-28 DIAGNOSIS — G4730 Sleep apnea, unspecified: Secondary | ICD-10-CM

## 2017-05-28 LAB — CBC WITH DIFFERENTIAL/PLATELET
Basophils Absolute: 0 10*3/uL (ref 0.0–0.1)
Basophils Relative: 0.5 % (ref 0.0–3.0)
EOS ABS: 0 10*3/uL (ref 0.0–0.7)
Eosinophils Relative: 0.4 % (ref 0.0–5.0)
HCT: 37.7 % (ref 36.0–49.0)
HEMOGLOBIN: 11.6 g/dL — AB (ref 12.0–16.0)
Lymphocytes Relative: 26.9 % (ref 24.0–48.0)
Lymphs Abs: 1.8 10*3/uL (ref 0.7–4.0)
MCHC: 30.8 g/dL — ABNORMAL LOW (ref 31.0–37.0)
MCV: 71.3 fl — ABNORMAL LOW (ref 78.0–98.0)
MONO ABS: 0.5 10*3/uL (ref 0.1–1.0)
Monocytes Relative: 6.7 % (ref 3.0–12.0)
Neutro Abs: 4.4 10*3/uL (ref 1.4–7.7)
Neutrophils Relative %: 65.5 % (ref 43.0–71.0)
Platelets: 262 10*3/uL (ref 150.0–575.0)
RBC: 5.29 Mil/uL (ref 3.80–5.70)
RDW: 16.8 % — ABNORMAL HIGH (ref 11.4–15.5)
WBC: 6.7 10*3/uL (ref 4.5–13.5)

## 2017-05-28 LAB — HEPATIC FUNCTION PANEL
ALBUMIN: 4.3 g/dL (ref 3.5–5.2)
ALT: 18 U/L (ref 0–35)
AST: 21 U/L (ref 0–37)
Alkaline Phosphatase: 89 U/L (ref 47–119)
Bilirubin, Direct: 0.1 mg/dL (ref 0.0–0.3)
Total Bilirubin: 0.5 mg/dL (ref 0.3–1.2)
Total Protein: 7.2 g/dL (ref 6.0–8.3)

## 2017-05-28 LAB — BASIC METABOLIC PANEL
BUN: 10 mg/dL (ref 6–23)
CHLORIDE: 105 meq/L (ref 96–112)
CO2: 33 meq/L — AB (ref 19–32)
Calcium: 9.5 mg/dL (ref 8.4–10.5)
Creatinine, Ser: 0.64 mg/dL (ref 0.40–1.20)
GFR: 153.96 mL/min (ref >60.00)
GLUCOSE: 101 mg/dL — AB (ref 70–99)
POTASSIUM: 4.6 meq/L (ref 3.5–5.1)
SODIUM: 141 meq/L (ref 135–145)

## 2017-05-28 LAB — TSH: TSH: 1.29 u[IU]/mL (ref 0.40–5.00)

## 2017-05-28 NOTE — Progress Notes (Signed)
Subjective:    Patient ID: Maureen Lewis, female    DOB: 05/15/1996, 21 y.o.   MRN: [id number]  HPI  21 year old female who  has a past medical history of Acid reflux; Anemia; Complication of anesthesia; Finger mass, left (03/2017); Irritable bowel syndrome (IBS); Morbid obesity with body mass index (BMI) of 45.0 to 49.9 in adult [facility]); Plantar fasciitis, bilateral; Polycystic ovary syndrome; and Sleep apnea. She presents today for follow up regarding weight loss management in the setting of morbid obesity with a BMI of 52. I last saw her one month ago and she was advised to start working on diet and exercise.   Today in the office she reports that she has been working on her diet. Per patient she is eating baked foods, is not going out to eat as often, has been working to cut out carbs and is drinking more water. She is also walking about 0.5 miles per episode every other day. She has been able to lose one pound this month. She was using the My Fitness Tracker App and found it beneficial but was finding it hard to remember to use it   She has been diagnosed with sleep apnea when she was at [facility]. She reports that she has had a sleep study done about a year ago and was supposed to have a CPAP but was never given one. She reports getting 3-5 hours per night.    Wt Readings from Last 3 Encounters:  05/28/17 (!) 418 lb (189.6 kg) (>99 %, Z= 3.11)*  04/24/17 (!) 419 lb 6.4 oz (190.2 kg) (>99 %, Z= 3.10)*  04/21/17 (!) 410 lb (186 kg) (>99 %, Z= 3.08)*   * Growth percentiles are based on CDC 2-20 Years data.     Review of Systems  Constitutional: Negative.   Respiratory: Negative.   Cardiovascular: Negative.   Gastrointestinal: Negative.   Neurological: Negative.   Hematological: Negative.   All other systems reviewed and are negative.  Past Medical History:  Diagnosis Date  . Acid reflux   . Anemia    no current med.  . Complication of anesthesia    states  had to keep giving her anesthesia during EGD  . Finger mass, left 03/2017   middle finger  . Irritable bowel syndrome (IBS)   . Morbid obesity with body mass index (BMI) of 45.0 to 49.9 in adult [facility])   . Plantar fasciitis, bilateral   . Polycystic ovary syndrome   . Sleep apnea    no CPAP use    Social History   Social History  . Marital status: Single    Spouse name: N/A  . Number of children: N/A  . Years of education: N/A   Occupational History  . Not on file.   Social History Main Topics  . Smoking status: Never Smoker  . Smokeless tobacco: Never Used  . Alcohol use Yes     Comment: social   . Drug use: No  . Sexual activity: No   Other Topics Concern  . Not on file   Social History Narrative   Lives with mom and cousin, a grandmother in [place] also helps care for her.       She is in nursing school.        Past Surgical History:  Procedure Laterality Date  . COLONOSCOPY WITH PROPOFOL  10/07/2016  . ESOPHAGOGASTRODUODENOSCOPY  12/21/2015  . EXCISION MASS UPPER EXTREMETIES Left 04/21/2017   Procedure: EXCISION  MASS LEFT MIDDLE FINGER;  Surgeon: Daryll Brod, MD;  Location: [facility];  Service: Orthopedics;  Laterality: Left;    Family History  Problem Relation Age of Onset  . Diabetes Maternal Aunt   . Hypertension Maternal Uncle   . Depression Maternal Grandfather   . Diabetes Maternal Grandfather   . Hypertension Maternal Grandfather   . Arthritis Mother   . High blood pressure Mother     Allergies  Allergen Reactions  . Bee Pollen Swelling, Shortness Of Breath and Hives  . Hydrocodone-Acetaminophen Anaphylaxis  . Peach Flavor Hives and Shortness Of Breath    SWELLING OF MOUTH  . Pollen Extract Hives, Shortness Of Breath and Swelling    Current Outpatient Prescriptions on File Prior to Visit  Medication Sig Dispense Refill  . diclofenac (VOLTAREN) 75 MG EC tablet Take 1 tablet (75 mg total) by mouth 2 (two) times daily as  needed for moderate pain. 20 tablet 0  . diclofenac sodium (VOLTAREN) 1 % GEL Apply 2 g topically 4 (four) times daily as needed (for pain). 100 g 0  . dicyclomine (BENTYL) 20 MG tablet Take 20 mg by mouth every 6 (six) hours.    . Ibuprofen-Famotidine 800-26.6 MG TABS Take by mouth.    . loratadine (CLARITIN) 10 MG tablet Take 10 mg by mouth daily.    . meloxicam (MOBIC) 15 MG tablet Take 15 mg by mouth daily.    Marland Kitchen omeprazole (PRILOSEC) 40 MG capsule TAKE ONE CAPSULE BY MOUTH  DAILY 30 capsule 3  . polyethylene glycol (MIRALAX / GLYCOLAX) packet Take 17 g by mouth daily.    Marland Kitchen triamcinolone cream (KENALOG) 0.1 % Apply 1 application topically 2 (two) times daily.     No current facility-administered medications on file prior to visit.     BP 118/78 (BP Location: Right Wrist)   Temp 98.2 F (36.8 C) (Oral)   Wt (!) 418 lb (189.6 kg)   BMI 52.95 kg/m       Objective:   Physical Exam  Constitutional: She is oriented to person, place, and time. She appears well-developed and well-nourished. No distress.  Morbidly obese    Cardiovascular: Normal rate, regular rhythm, normal heart sounds and intact distal pulses.  Exam reveals no gallop and no friction rub.   No murmur heard. Pulmonary/Chest: Effort normal and breath sounds normal. No respiratory distress. She has no wheezes. She has no rales. She exhibits no tenderness.  Neurological: She is alert and oriented to person, place, and time.  Skin: She is not diaphoretic.  Psychiatric: She has a normal mood and affect. Her behavior is normal. Thought content normal.  Nursing note and vitals reviewed.     Assessment & Plan:  1. Morbid obesity ([place]) - I have thought a lot about this patient over the last month and I do think she would benefit greatly from seeing Dr. Leafy Ro. She needs structure and diet plans to help aid her in losing weight. She is ok with seeing Dr. Leafy Ro. I advised her to continue to work on diet and exercise in the  meantime.  - Amb Ref to Medical Weight Management - TSH - Hepatic function panel - CBC with Differential/Platelet - Basic Metabolic Panel  2. Sleep apnea, unspecified type - Ambulatory referral to Pulmonology  Dorothyann Peng, NP

## 2017-07-01 ENCOUNTER — Encounter: Payer: Self-pay | Admitting: Neurology

## 2017-07-01 ENCOUNTER — Ambulatory Visit (INDEPENDENT_AMBULATORY_CARE_PROVIDER_SITE_OTHER): Payer: BLUE CROSS/BLUE SHIELD | Admitting: Neurology

## 2017-07-01 VITALS — BP 145/86 | HR 81 | Ht 74.0 in | Wt >= 6400 oz

## 2017-07-01 DIAGNOSIS — E669 Obesity, unspecified: Secondary | ICD-10-CM | POA: Diagnosis not present

## 2017-07-01 DIAGNOSIS — G932 Benign intracranial hypertension: Secondary | ICD-10-CM

## 2017-07-01 DIAGNOSIS — G4733 Obstructive sleep apnea (adult) (pediatric): Secondary | ICD-10-CM | POA: Diagnosis not present

## 2017-07-01 DIAGNOSIS — H539 Unspecified visual disturbance: Secondary | ICD-10-CM

## 2017-07-01 DIAGNOSIS — E662 Morbid (severe) obesity with alveolar hypoventilation: Secondary | ICD-10-CM | POA: Diagnosis not present

## 2017-07-01 DIAGNOSIS — R51 Headache: Secondary | ICD-10-CM | POA: Diagnosis not present

## 2017-07-01 DIAGNOSIS — R519 Headache, unspecified: Secondary | ICD-10-CM

## 2017-07-01 MED ORDER — TOPIRAMATE 25 MG PO TABS: 25.0000 mg | ORAL_TABLET | Freq: Two times a day (BID) | ORAL | 11 refills | Status: DC

## 2017-07-01 NOTE — Patient Instructions (Signed)
Idiopathic Intracranial Hypertension Idiopathic intracranial hypertension (IIH) is a condition that increases pressure around the brain. The fluid that surrounds the brain and spinal cord (cerebrospinal fluid, CSF) increases and causes the pressure. Idiopathic means that the cause of this condition is not known. IIH affects the brain and spinal cord (is a neurological disorder). If this condition is not treated, it can cause vision loss or blindness. What increases the risk? You are more likely to develop this condition if:  You are severely overweight (obese).  You are a woman who has not gone through menopause.  You take certain medicines, such as birth control or steroids.  What are the signs or symptoms? Symptoms of IIH include:  Headaches. This is the most common symptom.  Pain in the shoulders or neck.  Nausea and vomiting.  A "rushing water" or pulsing sound within the ears (pulsatile tinnitus).  Double vision.  Blurred vision.  Brief episodes of complete vision loss.  How is this diagnosed? This condition may be diagnosed based on:  Your symptoms.  Your medical history.  CT scan of the brain.  MRI of the brain.  Magnetic resonance venogram (MRV) to check veins in the brain.  Diagnostic lumbar puncture. This is a procedure to remove and examine a sample of cerebrospinal fluid. This procedure can determine whether too much fluid may be causing IIH.  A thorough eye exam to check for swelling or nerve damage in the eyes.  How is this treated? Treatment for this condition depends on your symptoms. The goal of treatment is to decrease the pressure around your brain. Common treatments include:  Medicines to decrease the production of spinal fluid and lower the pressure within your skull.  Medicines to prevent or treat headaches.  Surgery to place drains (shunts) in your brain to remove excess fluid.  Lumbar puncture to remove excess cerebrospinal  fluid.  Follow these instructions at home:  If you are overweight or obese, work with your health care provider to lose weight.  Take over-the-counter and prescription medicines only as told by your health care provider.  Do not drive or use heavy machinery while taking medicines that can make you sleepy.  Keep all follow-up visits as told by your health care provider. This is important. Contact a health care provider if:  You have changes in your vision, such as: ? Double vision. ? Not being able to see colors (color vision). Get help right away if:  You have any of the following symptoms and they get worse or do not get better. ? Headaches. ? Nausea. ? Vomiting. ? Vision changes or difficulty seeing. Summary  Idiopathic intracranial hypertension (IIH) is a condition that increases pressure around the brain. The cause is not known (is idiopathic).  The most common symptom of IIH is headaches.  Treatment may include medicines or surgery to relieve the pressure on your brain. This information is not intended to replace advice given to you by your health care provider. Make sure you discuss any questions you have with your health care provider. Document Released: 12/15/2001 Document Revised: 08/27/2016 Document Reviewed: 08/27/2016 Elsevier Interactive Patient Education  2017 [employer]  

## 2017-07-01 NOTE — Progress Notes (Signed)
Provider:  Larey Seat, M D  Referring Provider: Eloise Levels, NP at [facility],  Primary Care Physician:  Maureen Peng, NP  Chief Complaint  Patient presents with  . Follow-up    pt said that just recently she has started to experience the symptoms, felt like she was about to pass out at work, she is having hot flashes. bright lights were hurting her eyes. All of these symptoms happened yesterday.     I see Maureen Lewis today on 07/01/2017, my last visit with her was in late 2017. The patient just started having presyncopal feelings, hot flash sensations, photophobia and presents with a headache. Her headache arises from both temples and feels to her like a retro-orbital pressure. He does not encircle the head is not like a tight vise around the head. It gets worse if she bends down or looks up. She does not report nausea. She is in online school, works as a Scientist, water quality at [employer]. She is not longer enrolled in [place]. She gained further weight. She remained on TOPIRAMATE which has helped with headaches, but now she has them very intense again. Vision is blurred. - she does not drive now. I did not order an LP after her optometrist found no papillae last 08-2016, but feel we need this now. We should finally get a sleep study, too. She had mild OSA last year by HST ( Dr Maureen Lewis)  .    HPI:  Maureen Lewis is a 21 y.o. female seen here as a referral from [place] at [facility] for evaluation of dizziness and headaches. 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 She has struggled with morbid obesity for the last 4 years, she has very irregular menstrual periods, and she had her first period in 2014. She went through puberty rather late. The weight gain precipitated the first menstrual cycle. She has always been tall but for the last for 5 years rather large. Her mother reports that even as a toddler when looking at growth charts her daughter  exceeded height and weight for her age group by file. Recently both have changed some eating habits but thus far this has not led to a significant weight loss yet. Maureen Lewis was also evaluated in a sleep study and diagnosed with obstructive sleep apnea but reports that treatment was not initiated as her primary care physician felt that she has to lose weight to treat the apnea.  She is not able to just loose weight. She is also suspected to have polycystic ovarian syndrome and she has seen an endocrinologist shortly before turning 20 whose position was that he does not see minors. She was accompanied by her mother. She went all the way to [facility] for an endocrinology consultation which led not to any medical therapy at this point. The endocrinologist had mentioned to her that she would like to see an ovarian ultrasound but to her knowledge none has been ordered.   Her reason for appointment today is that she experienced dizzy spells during which she develops a staggering gait feels off balance and a high fall risk. He started only in September the spells occurred suddenly it is not as if she could this anticipate them she doesn't really have an aura or warning. They have been daily occurrences for while. They last also for rest of the day until she goes to bed- many hours. She experienced vertigo, described in  the room of surroundings spinning counterclockwise- Please note, she suffers from extreme ear wax buildup. At tmes the vertigo was less prominent and rather felt like lightheadedness, presyncope. She has fainted and fell downstairs, and she had completely blacked out. She lost awareness of her surroundings found herself waking up at the bottom of the staircase and may have well been out for several minutes.  Her fall has related to constant headaches that have not improved or worsened over the last weeks. For this reason I will also order an MRI.  Interval history from 09/25/2016. I had referred  Maureen Lewis for vestibular rehabilitation and she has had 2 appointments and is currently free of vertigo. I had also ordered an auto titrate her CPAP for her based on home sleep test that Dr. Maxwell Lewis obtained. No CPAP machine has yet been issued due to insurance questions, I have sent the order to [facility]. An MRI had been ordered through [facility] on [address] and is listed in [place] as having been scheduled it is not finalized. Therefore I did not receive a report of the results. The patient had been given a CD-ROM but this should have also been available here at my office. My nurse is currently investigating. Considering that the patient has been doing well in terms of vertigo relief, and that she had seen optometry for vaginal exam which did not show papilledema, nicking, and shows normal venous pulsation no optic nerve abnormalities, I would feel comfortable holding off on a spinal tap. Should her MRI show an empty sella and flattened orbital structures, he should have still obtained an opening pressure. Maureen Lewis also mentioned to me today that she had a new phenomenon of sudden vision loss in the right eye only ,coming down like a curtain from top to bottom into her visual field. She feels that part of her visual field is blackened out and that she can only differentiate light, that her vision is blurred with this attack which lasts on average 3 minutes. She has reported for of the spells. One of them affected both eyes.  We will have to investigate her for amaurosis fugax. Once we have a chance to review her MRI ,we will decide on spinal tap yes or no-. carotid artery ultrasound , yes or no, and if bubble study  Is needed , yes or no.  I decided to order both studies and  Start her on TOPAMAX 25 mg bid po. Rv in 2 month with me or NP. Maureen Lewis had seen optometrist , no visible optic nerve pallor. Her exam took place on 09/05/2016 and shows myopia with astigmatism no papilledema, no swelling of the  optic disc and no discoloration. Intraocular pressure was 18 bilaterally.  The patient's GYN MD is Everett Graff, MD.      Review of Systems: Out of a complete 14 system review, the patient complains of only the following symptoms, and all other reviewed systems are negative. Improved vertigo, symptom free today  at this time untreated sleep apnea according to the patient's verbal report about a sleep study obtained in August 2017.  Most daily headaches since her fall, concussion? She reports a pressure behind the eye and in the temple associated with nausea, photophobia. improved since last visit.  4 spells of visual field restriction, 3 minute duration and no associated HA or nausea with this.    Social History   Social History  . Marital status: Single    Spouse name: N/A  .  Number of children: N/A  . Years of education: N/A   Occupational History  . Not on file.   Social History Main Topics  . Smoking status: Never Smoker  . Smokeless tobacco: Never Used  . Alcohol use Yes     Comment: social   . Drug use: No  . Sexual activity: No   Other Topics Concern  . Not on file   Social History Narrative   Lives with mom and cousin, a grandmother in [place] also helps care for her.       She is in nursing school.        Family History  Problem Relation Age of Onset  . Diabetes Maternal Aunt   . Hypertension Maternal Uncle   . Depression Maternal Grandfather   . Diabetes Maternal Grandfather   . Hypertension Maternal Grandfather   . Arthritis Mother   . High blood pressure Mother     Past Medical History:  Diagnosis Date  . Acid reflux   . Anemia    no current med.  . Complication of anesthesia    states had to keep giving her anesthesia during EGD  . Finger mass, left 03/2017   middle finger  . Irritable bowel syndrome (IBS)   . Morbid obesity with body mass index (BMI) of 45.0 to 49.9 in adult [facility])   . Plantar fasciitis, bilateral   . Polycystic ovary  syndrome   . Sleep apnea    no CPAP use    Past Surgical History:  Procedure Laterality Date  . COLONOSCOPY WITH PROPOFOL  10/07/2016  . ESOPHAGOGASTRODUODENOSCOPY  12/21/2015  . EXCISION MASS UPPER EXTREMETIES Left 04/21/2017   Procedure: EXCISION MASS LEFT MIDDLE FINGER;  Surgeon: Daryll Brod, MD;  Location: [place];  Service: Orthopedics;  Laterality: Left;    Current Outpatient Prescriptions  Medication Sig Dispense Refill  . diclofenac (VOLTAREN) 75 MG EC tablet Take 1 tablet (75 mg total) by mouth 2 (two) times daily as needed for moderate pain. 20 tablet 0  . diclofenac sodium (VOLTAREN) 1 % GEL Apply 2 g topically 4 (four) times daily as needed (for pain). 100 g 0  . dicyclomine (BENTYL) 20 MG tablet Take 20 mg by mouth every 6 (six) hours.    Marland Kitchen loratadine (CLARITIN) 10 MG tablet Take 1 tablet by mouth daily.    Marland Kitchen omeprazole (PRILOSEC) 40 MG capsule TAKE ONE CAPSULE BY MOUTH  DAILY 30 capsule 3  . polyethylene glycol (MIRALAX / GLYCOLAX) packet Take 17 g by mouth daily.    Marland Kitchen triamcinolone cream (KENALOG) 0.1 % Apply 1 application topically 2 (two) times daily.    Marland Kitchen topiramate (TOPAMAX) 25 MG tablet      No current facility-administered medications for this visit.     Allergies as of 07/01/2017 - Review Complete 07/01/2017  Allergen Reaction Noted  . Bee pollen Swelling, Shortness Of Breath, and Hives 12/04/2015  . Hydrocodone-acetaminophen Anaphylaxis 04/29/2017  . Peach flavor Hives and Shortness Of Breath 04/14/2017  . Pollen extract Hives, Shortness Of Breath, and Swelling 12/04/2015    Vitals: BP (!) 145/86   Pulse 81   Ht 6\' 2"  (1.88 m)   Wt (!) 413 lb (187.3 kg)   BMI 53.03 kg/m  Last Weight:  Wt Readings from Last 1 Encounters:  07/01/17 (!) 413 lb (187.3 kg) (>99 %, Z= 3.12)*   * Growth percentiles are based on CDC 2-20 Years data.   Last Height:   Ht Readings from  Last 1 Encounters:  07/01/17 6\' 2"  (1.88 m) (>99 %, Z= 3.84)*   *  Growth percentiles are based on CDC 2-20 Years data.    Physical exam:  General: The patient is awake, alert and appears not in acute distress. The patient is well groomed. Head: Normocephalic, atraumatic. Neck is supple. Mallampati 5, neck circumference: 20.25 inches  Cardiovascular:  Regular rate and rhythm , without  murmurs or carotid bruit, and without distended neck veins. Respiratory: Lungs are clear to auscultation. Skin:  Without evidence of edema, or rash Trunk: BMI is super - elevated and patient  has normal posture.  Neurologic exam : The patient is awake and alert, oriented to place and time. There is a normal attention span & concentration ability. Speech is fluent without dysarthria, dysphonia or aphasia. Mood and affect are appropriate.  Cranial nerves: Pupils are equal and briskly reactive to light. Funduscopic exam -unclear if there is edema. The retina is very pale.  Extraocular movements  in vertical and horizontal planes intact and still without nystagmus. Visual fields by finger perimetry are intact. Hearing to finger rub intact.  Facial sensation intact to fine touch. Facial motor strength is symmetric and tongue and uvula move midline. Tongue protrusion into either cheek is normal. Shoulder shrug is normal. Motor exam:  Normal tone ,muscle bulk and symmetric  strength in all extremities. Sensory:  Fine touch, pinprick and vibration were tested in all extremities. Proprioception was normal. Coordination:  Finger-to-nose maneuver  normal without evidence of ataxia, dysmetria or tremor. Gait and station: Patient walks without assistive device and is able unassisted to climb up to the exam table. Strength within normal limits.  Deep tendon reflexes: in the  upper and lower extremities are symmetric . Babinski maneuver response is downgoing.   Assessment:  After physical and neurologic examination, review of laboratory studies, imaging, neurophysiology testing and  pre-existing records, assessment is that of :   Headaches without associated dizziness or nausea.    Morbid obesity; Her obesity can also put her at risk for so-called pseudotumor cerebri this can be promoted also by obesity related hypoventilation, untreated sleep apnea, as well as some medication such as doxycycline, Retin-A.  She does have hirsutism. Super-obesity, supposing diagnosed with low estrogen/ progesterone. Amenorrhea- fits polycystic ovarian syndrome. She is also at a higher risk of developing diabetes. Dr Everett Graff sees her now.   Patient endorsed the Epworth Sleepiness Scale at 16 points, with excessive daytime sleepiness is likely a manifestation of sleep apnea no matter how mild. I was just able to obtain her home sleep test report from 05/23/2016 it documented an approximate AHI at 7.2 per hour, oxygen desaturation to an nadir of 74% saturation, total desaturation time was only 9 minutes, I will now repeat the sleep study as her previous doctor has not followed up with her.    Plan:  Treatment plan and additional workup : LP fluoroscopic with opening pressure, cells and diff, protein, glucose and oligoclonal bands.  CT Brain in prep for LP. PSG study. Split protocol with obesity hypoventilation risk patient.        Maureen Partridge Mareon Robinette MD 07/01/2017

## 2017-07-09 ENCOUNTER — Telehealth: Payer: Self-pay | Admitting: Neurology

## 2017-07-09 NOTE — Telephone Encounter (Signed)
Maureen Lewis with GI called office in reference to order for LP per Maureen Lewis patient excesses the weight limit for their table and patient should be able to have done at either hospitals.  FYI

## 2017-07-13 ENCOUNTER — Ambulatory Visit (HOSPITAL_COMMUNITY)
Admission: EM | Admit: 2017-07-13 | Discharge: 2017-07-13 | Disposition: A | Discharge status: Home / Self Care | Payer: BLUE CROSS/BLUE SHIELD | Attending: Family Medicine | Admitting: Family Medicine

## 2017-07-13 ENCOUNTER — Encounter (HOSPITAL_COMMUNITY): Payer: Self-pay | Admitting: *Deleted

## 2017-07-13 DIAGNOSIS — M7662 Achilles tendinitis, left leg: Secondary | ICD-10-CM

## 2017-07-13 DIAGNOSIS — M778 Other enthesopathies, not elsewhere classified: Secondary | ICD-10-CM

## 2017-07-13 DIAGNOSIS — M779 Enthesopathy, unspecified: Secondary | ICD-10-CM

## 2017-07-13 NOTE — ED Triage Notes (Signed)
Pt  Has  Two  issuses   She  Has  Pain l  Middle  Finer with an  increse  On  rom    She  Also  Has  Pain  And   Swelling of the  Left  Ankle    For  sev   Weeks   She  Denies  Any  specefic injury

## 2017-07-13 NOTE — ED Provider Notes (Signed)
[place]    CSN: [id number] Arrival date & time: 07/13/17  1120     History   Chief Complaint Chief Complaint  Patient presents with  . Ankle Pain    HPI Maureen Lewis is a 21 y.o. female.   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 She has prolonged standing most of the day she she is using the cash pressure over the right hand and then lifting pulling and moving objects with the left. She is complaining of left hand pain primarily pain along the left long finger that radiates to the hand as well as pain along the base of the thumb and radial wrist. Denies any known injury.  Also complains of left ankle pain directly over the lower Achilles tendon. Pain is worse when she stands and ascends on her toes and ambulation. The pain does not radiate but remains over the distal Achilles tendon. No trauma. States she did not twist or injure her ankle in any way.      Past Medical History:  Diagnosis Date  . Acid reflux   . Anemia    no current med.  . Complication of anesthesia    states had to keep giving her anesthesia during EGD  . Finger mass, left 03/2017   middle finger  . Irritable bowel syndrome (IBS)   . Morbid obesity with body mass index (BMI) of 45.0 to 49.9 in adult [facility])   . Plantar fasciitis, bilateral   . Polycystic ovary syndrome   . Sleep apnea    no CPAP use    Patient Active Problem List   Diagnosis Date Noted  . Pseudotumor cerebri syndrome 08/28/2016  . Super obesity 08/28/2016  . Obesity hypoventilation syndrome ([place]) 08/28/2016  . Benign paroxysmal positional vertigo due to bilateral vestibular disorder 08/28/2016  . OSA (obstructive sleep apnea) 08/28/2016  . Hypersomnia with sleep apnea 08/28/2016  . Female hirsutism 08/28/2016  . Wrist pain 05/30/2015  . Back pain 04/16/2015  . Amenorrhea 04/13/2015  . Allergic cough 03/28/2015  . Sexually active at young age 67/04/2015  . GERD (gastroesophageal reflux disease)  02/24/2015  . Constipation 10/02/2014  . Shoulder pain, left 01/29/2014  . Bilateral hand numbness 11/14/2013  . Elevated fasting glucose 05/20/2012  . Marland (well child check) 05/20/2012    Past Surgical History:  Procedure Laterality Date  . COLONOSCOPY WITH PROPOFOL  10/07/2016  . ESOPHAGOGASTRODUODENOSCOPY  12/21/2015  . EXCISION MASS UPPER EXTREMETIES Left 04/21/2017   Procedure: EXCISION MASS LEFT MIDDLE FINGER;  Surgeon: Daryll Brod, MD;  Location: [place];  Service: Orthopedics;  Laterality: Left;    OB History    No data available       Home Medications    Prior to Admission medications   Medication Sig Start Date End Date Taking? Authorizing Provider  diclofenac (VOLTAREN) 75 MG EC tablet Take 1 tablet (75 mg total) by mouth 2 (two) times daily as needed for moderate pain. 03/03/17   Katy Apo, NP  diclofenac sodium (VOLTAREN) 1 % GEL Apply 2 g topically 4 (four) times daily as needed (for pain). 03/03/17   Katy Apo, NP  dicyclomine (BENTYL) 20 MG tablet Take 20 mg by mouth every 6 (six) hours.    [provider]  loratadine (CLARITIN) 10 MG tablet Take 1 tablet by mouth daily.    [provider]  omeprazole (PRILOSEC) 40 MG capsule TAKE ONE CAPSULE BY MOUTH  DAILY 10/16/15  Luiz Blare Y, DO  polyethylene glycol (MIRALAX / GLYCOLAX) packet Take 17 g by mouth daily.    [provider]  topiramate (TOPAMAX) 25 MG tablet Take 1 tablet (25 mg total) by mouth 2 times daily at 12 noon and 4 pm. 07/01/17   Dohmeier, Asencion Partridge, MD  triamcinolone cream (KENALOG) 0.1 % Apply 1 application topically 2 (two) times daily.    [provider]    Family History Family History  Problem Relation Age of Onset  . Diabetes Maternal Aunt   . Hypertension Maternal Uncle   . Depression Maternal Grandfather   . Diabetes Maternal Grandfather   . Hypertension Maternal Grandfather   . Arthritis Mother   . High blood pressure  Mother     Social History Social History  Substance Use Topics  . Smoking status: Never Smoker  . Smokeless tobacco: Never Used  . Alcohol use Yes     Comment: social      Allergies   Bee pollen; Hydrocodone-acetaminophen; Peach flavor; and Pollen extract   Review of Systems Review of Systems  Constitutional: Negative for activity change, chills and fever.  HENT: Negative.   Respiratory: Negative.   Cardiovascular: Negative.   Musculoskeletal:       As per HPI  Skin: Negative for color change, pallor and rash.  Neurological: Negative.      Physical Exam Triage Vital Signs ED Triage Vitals  Enc Vitals Group     BP 07/13/17 1253 (!) 150/86     Pulse Rate 07/13/17 1253 78     Resp 07/13/17 1253 18     Temp 07/13/17 1253 98.6 F (37 C)     Temp Source 07/13/17 1253 Oral     SpO2 07/13/17 1253 99 %     Weight --      Height --      Head Circumference --      Peak Flow --      Pain Score 07/13/17 1255 6     Pain Loc --      Pain Edu? --      Excl. in [place]? --    No data found.   Updated Vital Signs BP (!) 150/86 (BP Location: Right Arm)   Pulse 78   Temp 98.6 F (37 C) (Oral)   Resp 18   SpO2 99%   Visual Acuity Right Eye Distance:   Left Eye Distance:   Bilateral Distance:    Right Eye Near:   Left Eye Near:    Bilateral Near:     Physical Exam  Constitutional: She is oriented to person, place, and time. She appears well-developed and well-nourished. No distress.  HENT:  Head: Normocephalic and atraumatic.  Eyes: EOM are normal.  Neck: Neck supple.  Cardiovascular: Normal rate.   Pulmonary/Chest: Effort normal.  Musculoskeletal: Normal range of motion. She exhibits no edema.  Tenderness to the dorsum of the left hand. Pain is produced when extending the digits against resistance. There is no swelling or deformity, redness or other dis- coloration. Grip is normal. Positive Finkelstein sign.  Left ankle with tenderness directly over the distal  Achilles tendon. The tenderness moved and continuous without palpable indentation or disruption of the tendon structure. Patient is able to stand and to stand on both toes. No bony tenderness to the ankle. Dorsiflexion and plantarflexion intact. No deformity. Distal neurovascular motor sensory grossly intact.  Lymphadenopathy:    She has no cervical adenopathy.  Neurological: She is alert and oriented  to person, place, and time. No cranial nerve deficit.  Skin: Skin is warm and dry.  Psychiatric: She has a normal mood and affect.  Nursing note and vitals reviewed.    UC Treatments / Results  Labs (all labs ordered are listed, but only abnormal results are displayed) Labs Reviewed - No data to display  EKG  EKG Interpretation None       Radiology No results found.  Procedures Procedures (including critical care time)  Medications Ordered in UC Medications - No data to display   Initial Impression / Assessment and Plan / UC Course  I have reviewed the triage vital signs and the nursing notes.  Pertinent labs & imaging results that were available during my care of the patient were reviewed by me and considered in my medical decision making (see chart for details).     Try ice to the areas of soreness first. If this is not helping start using heat. Wear the wrap to the left ankle several days or even a couple weeks for support. Apply ice to the Achilles tendon off and on. Try to limit weightbearing and ambulation as much as possible for the next few days. Wear the wrist and hand splint for the next week or 2 and minimize use of the hands and digits as much as possible. May take ibuprofen or Aleve for inflammation and pain.   Final Clinical Impressions(s) / UC Diagnoses   Final diagnoses:  Achilles tendinitis of left lower extremity  Tendinitis of thumb  Tendinitis of left hand    New Prescriptions New Prescriptions   No medications on file     Controlled Substance  Prescriptions [place] Controlled Substance Registry consulted? Not Applicable   Janne Napoleon, NP 07/13/17 1349

## 2017-07-13 NOTE — Telephone Encounter (Signed)
LP is scheduled at [facility] this Friday 07/17/2017 check in at admitting at 7:30 am No restrictions . Zacarias Pontes scheduling [phone]. I have called and Left Patient and Detailed message about her instructions and asking her to call me back to make sure she understands.

## 2017-07-13 NOTE — Discharge Instructions (Signed)
Try ice to the areas of soreness first. If this is not helping start using heat. Wear the wrap to the left ankle several days or even a couple weeks for support. Apply ice to the Achilles tendon off and on. Try to limit weightbearing and ambulation as much as possible for the next few days. Wear the wrist and hand splint for the next week or 2 and minimize use of the hands and digits as much as possible. May take ibuprofen or Aleve for inflammation and pain.

## 2017-07-15 ENCOUNTER — Other Ambulatory Visit: Payer: Self-pay | Admitting: Student

## 2017-07-16 ENCOUNTER — Ambulatory Visit
Admission: RE | Admit: 2017-07-16 | Discharge: 2017-07-16 | Disposition: A | Discharge status: Home / Self Care | Payer: BLUE CROSS/BLUE SHIELD | Source: Ambulatory Visit | Attending: Neurology | Admitting: Neurology

## 2017-07-16 DIAGNOSIS — G932 Benign intracranial hypertension: Secondary | ICD-10-CM

## 2017-07-16 DIAGNOSIS — R51 Headache: Secondary | ICD-10-CM

## 2017-07-16 DIAGNOSIS — E669 Obesity, unspecified: Secondary | ICD-10-CM

## 2017-07-16 DIAGNOSIS — G4733 Obstructive sleep apnea (adult) (pediatric): Secondary | ICD-10-CM

## 2017-07-16 DIAGNOSIS — R519 Headache, unspecified: Secondary | ICD-10-CM

## 2017-07-16 DIAGNOSIS — H539 Unspecified visual disturbance: Secondary | ICD-10-CM

## 2017-07-16 DIAGNOSIS — E662 Morbid (severe) obesity with alveolar hypoventilation: Secondary | ICD-10-CM

## 2017-07-17 ENCOUNTER — Ambulatory Visit (HOSPITAL_COMMUNITY)
Admission: RE | Admit: 2017-07-17 | Discharge: 2017-07-17 | Disposition: A | Discharge status: Home / Self Care | Payer: BLUE CROSS/BLUE SHIELD | Source: Ambulatory Visit | Attending: Neurology | Admitting: Neurology

## 2017-07-17 ENCOUNTER — Other Ambulatory Visit: Payer: Self-pay | Admitting: Neurology

## 2017-07-17 ENCOUNTER — Encounter (HOSPITAL_COMMUNITY): Payer: Self-pay | Admitting: *Deleted

## 2017-07-17 DIAGNOSIS — G4733 Obstructive sleep apnea (adult) (pediatric): Secondary | ICD-10-CM

## 2017-07-17 DIAGNOSIS — Z5309 Procedure and treatment not carried out because of other contraindication: Secondary | ICD-10-CM | POA: Insufficient documentation

## 2017-07-17 DIAGNOSIS — R51 Headache: Secondary | ICD-10-CM

## 2017-07-17 DIAGNOSIS — H539 Unspecified visual disturbance: Secondary | ICD-10-CM

## 2017-07-17 DIAGNOSIS — E669 Obesity, unspecified: Secondary | ICD-10-CM

## 2017-07-17 DIAGNOSIS — E662 Morbid (severe) obesity with alveolar hypoventilation: Secondary | ICD-10-CM | POA: Insufficient documentation

## 2017-07-17 DIAGNOSIS — Z68.41 Body mass index (BMI) pediatric, greater than or equal to 95th percentile for age: Secondary | ICD-10-CM | POA: Diagnosis not present

## 2017-07-17 DIAGNOSIS — R519 Headache, unspecified: Secondary | ICD-10-CM

## 2017-07-17 DIAGNOSIS — G932 Benign intracranial hypertension: Secondary | ICD-10-CM | POA: Insufficient documentation

## 2017-07-17 MED ORDER — LIDOCAINE HCL (PF) 1 % IJ SOLN
10.0000 mL | Freq: Once | INTRAMUSCULAR | Status: AC
  Administered 2017-07-17: 10 mL via INTRADERMAL

## 2017-07-17 MED ORDER — LIDOCAINE HCL (PF) 1 % IJ SOLN
INTRAMUSCULAR | Status: AC
  Filled 2017-07-17: qty 10

## 2017-07-22 ENCOUNTER — Telehealth: Payer: Self-pay | Admitting: Neurology

## 2017-07-22 NOTE — Telephone Encounter (Signed)
-----   Message from Larey Seat, MD sent at 07/17/2017 11:14 AM EDT ----- unsucessfull Lp- we will need to keep patient on medication since we could not establish and opening pressure. CD

## 2017-07-22 NOTE — Telephone Encounter (Signed)
Called and spoke with the patient and made her aware to continue her medications and she verbalized understanding and had no further questions.

## 2017-08-02 ENCOUNTER — Ambulatory Visit (INDEPENDENT_AMBULATORY_CARE_PROVIDER_SITE_OTHER): Payer: BLUE CROSS/BLUE SHIELD | Admitting: Neurology

## 2017-08-02 DIAGNOSIS — R51 Headache: Secondary | ICD-10-CM

## 2017-08-02 DIAGNOSIS — E662 Morbid (severe) obesity with alveolar hypoventilation: Secondary | ICD-10-CM

## 2017-08-02 DIAGNOSIS — G932 Benign intracranial hypertension: Secondary | ICD-10-CM

## 2017-08-02 DIAGNOSIS — R519 Headache, unspecified: Secondary | ICD-10-CM

## 2017-08-02 DIAGNOSIS — H539 Unspecified visual disturbance: Secondary | ICD-10-CM

## 2017-08-02 DIAGNOSIS — E669 Obesity, unspecified: Secondary | ICD-10-CM

## 2017-08-02 DIAGNOSIS — G4733 Obstructive sleep apnea (adult) (pediatric): Secondary | ICD-10-CM

## 2017-08-04 ENCOUNTER — Encounter (INDEPENDENT_AMBULATORY_CARE_PROVIDER_SITE_OTHER): Payer: BLUE CROSS/BLUE SHIELD

## 2017-08-05 ENCOUNTER — Telehealth: Payer: Self-pay | Admitting: Neurology

## 2017-08-05 NOTE — Procedures (Signed)
PATIENT'S NAME:  Maureen Lewis, Maureen Lewis DOB:      10-06-1996      MR#:    [id number]     DATE OF RECORDING: 08/02/2017 REFERRING M.D.:  Dorothyann Peng, NP Study Performed:   Baseline Polysomnogram HISTORY:  Possible sleep apnea, hypoxemia and hypercapnia.   21 year old female patient with super obesity experienced headaches, retro orbital pressure, dizzy spells during which she develops a staggering gait, feels off balance, and at high fall risk. She experienced vertigo, described in the room of surroundings spinning counter-clockwise- At times the vertigo was less prominent and rather felt like lightheadedness, or pre-syncope. She reports to have fainted and fallen downstairs, had completely blacked out. She lost awareness of her surroundings, found herself waking up at the bottom of the staircase and may have well been out for several minutes. She reports headaches and blurred vision.   The patient's weight 413 pounds with a height of 74 (inches), resulting in a BMI of 52.9 kg/m2. The patient's neck circumference measured 20.2 inches. Epworth score not noted.   CURRENT MEDICATIONS: Diclofenac, Dicyclomine, Loratadine, Omeprazole, Polyethylene, Triamcinolone and Topiramate   PROCEDURE:  This is a multichannel digital polysomnogram utilizing the Somnostar 11.2 system.  Electrodes and sensors were applied and monitored per AASM Specifications.   EEG, EOG, Chin and Limb EMG, were sampled at 200 Hz.  ECG, Snore and Nasal Pressure, Thermal Airflow, Respiratory Effort, CPAP Flow and Pressure, Oximetry was sampled at 50 Hz. Digital video and audio were recorded.      BASELINE STUDY Lights Out was at 21:27 and Lights On at 05:00.  Total recording time (TRT) was 454 minutes, with a total sleep time (TST) of 386.5 minutes.   The patient's sleep latency was 11 minutes.  REM latency was 116 minutes.  The sleep efficiency was 85.1 %.     SLEEP ARCHITECTURE: WASO (Wake after sleep onset) was 56.5 minutes.  There were 26  minutes in Stage N1, 266.5 minutes Stage N2, 38 minutes Stage N3 and 56 minutes in Stage REM.  The percentage of Stage N1 was 6.7%, Stage N2 was 69.%, Stage N3 was 9.8% and Stage R (REM sleep) was 14.5%.   RESPIRATORY ANALYSIS:  There were a total of 54 respiratory events:  1 obstructive apnea, 0 central apneas and 53 hypopneas with 0 respiratory event related arousals (RERAs).  The total APNEA/HYPOPNEA INDEX (AHI) was 8.4/hour and the total RESPIRATORY DISTURBANCE INDEX was 8.4 /hour.  37 events occurred in REM sleep and 34 events in NREM. The REM AHI was 39.6 /hour, versus a non-REM AHI of 3.1. The patient spent 237 minutes of total sleep time in the supine position and 150 minutes in non-supine. The supine AHI was 9.9 versus a non-supine AHI of 6.0.  OXYGEN SATURATION & C02:  The Wake baseline 02 saturation was 92%, with the lowest being 48%.  Time spent below 89% saturation equaled 13 minutes. End Tidal CO2 during sleep time greater than 40 torr was 0.00 minutes.   PERIODIC LIMB MOVEMENTS:  The patient had a total of 18 Periodic Limb Movements.  The Periodic Limb Movement (PLM) index was 2.8 and the PLM Arousal index was 0.5/hour. The arousals were noted as: 35 were spontaneous, 3 were associated with PLMs, and 53 were associated with respiratory events. Audio and video analysis did not show any abnormal or unusual movements, behaviors, phonations or vocalizations.  No nocturia. Snoring and apnea clustered in REM sleep. EKG was in keeping with normal sinus rhythm (  NSR). Post-study, the patient indicated that sleep was the same as usual.    IMPRESSION: Mild Obstructive Sleep Apnea (OSA) with strong REM sleep accentuation, AHI in general was 8.4 but in REM sleep 39.6/hr.  1. No clinically significant hypoxemia, hypercapnia or other findings supporting obesity hypoventilation.  2. Snoring 3. Repetitive Intrusions of Sleep  RECOMMENDATIONS:  1. Advise full-night, attended, CPAP titration study to  optimize therapy. I was not able to connect the patients spells and headaches to EEG findings , EKG abnormalities or hypoxemia/ hypercapnia.   2. Advise to lose weight, by diet and exercise if not contraindicated (BMI 52.9). 3. Further information regarding OSA may be obtained from [employer] (www.sleepfoundation.org) or American Sleep Apnea Association (www.sleepapnea.org). 4. Consider dedicated sleep psychology referral if insomnia is of clinical concern.   5. A follow up appointment will be scheduled in the Sleep Clinic at [facility]. The referring provider will be notified of the results.      I certify that I have reviewed the entire raw data recording prior to the issuance of this report in accordance with the Standards of Accreditation of the American Academy of Sleep Medicine (AASM)    Larey Seat, MD   08-05-2017  Diplomat, American Board of Psychiatry and Neurology  Diplomat, American Board of [place] Director, [place] [facility] [employer]

## 2017-08-05 NOTE — Telephone Encounter (Signed)
-----   Message from Larey Seat, MD sent at 08/05/2017 12:47 PM EDT ----- Mild OSA , bur severe during REM sleep- this would be treated by CPAP.  No evidence of low oxygen or high Co2 levels to support contribution to headaches, EEG was normal , no epileptiform activity. Will invite back for CPAP.

## 2017-08-05 NOTE — Telephone Encounter (Signed)
Called to discuss sleep study results. No answer. LVM for patient to return call.

## 2017-08-05 NOTE — Addendum Note (Signed)
Addended by: Larey Seat on: 08/05/2017 12:47 PM   Modules accepted: Orders

## 2017-08-13 ENCOUNTER — Ambulatory Visit (INDEPENDENT_AMBULATORY_CARE_PROVIDER_SITE_OTHER): Payer: BLUE CROSS/BLUE SHIELD | Admitting: Family Medicine

## 2017-08-13 ENCOUNTER — Encounter (INDEPENDENT_AMBULATORY_CARE_PROVIDER_SITE_OTHER): Payer: Self-pay | Admitting: Family Medicine

## 2017-08-13 VITALS — BP 135/72 | HR 70 | Temp 98.1°F | Ht 73.0 in | Wt >= 6400 oz

## 2017-08-13 DIAGNOSIS — R0602 Shortness of breath: Secondary | ICD-10-CM | POA: Diagnosis not present

## 2017-08-13 DIAGNOSIS — Z1331 Encounter for screening for depression: Secondary | ICD-10-CM | POA: Diagnosis not present

## 2017-08-13 DIAGNOSIS — Z0289 Encounter for other administrative examinations: Secondary | ICD-10-CM

## 2017-08-13 DIAGNOSIS — G4733 Obstructive sleep apnea (adult) (pediatric): Secondary | ICD-10-CM

## 2017-08-13 DIAGNOSIS — Z6841 Body Mass Index (BMI) 40.0 and over, adult: Secondary | ICD-10-CM | POA: Diagnosis not present

## 2017-08-13 DIAGNOSIS — E282 Polycystic ovarian syndrome: Secondary | ICD-10-CM | POA: Insufficient documentation

## 2017-08-13 DIAGNOSIS — R5383 Other fatigue: Secondary | ICD-10-CM | POA: Insufficient documentation

## 2017-08-13 NOTE — Telephone Encounter (Signed)
Called and spoke with the patient and made her aware that Dr Brett Fairy had the chance to read her study and noticed she had mild sleep apnea. When she went into the REM sleep her apnea increased which is why she is suggesting CPAP. She would like to bring the patient in for CPAP titration. I educated the patient on what that means and that she will get a call from our sleep lab to get her set up with this test. Pt verbalized understanding. Pt had no questions at this time but was encouraged to call back if questions arise.

## 2017-08-14 LAB — CBC WITH DIFFERENTIAL
Basophils Absolute: 0 10*3/uL (ref 0.0–0.2)
Basos: 1 %
EOS (ABSOLUTE): 0 10*3/uL (ref 0.0–0.4)
Eos: 0 %
Hematocrit: 38.3 % (ref 34.0–46.6)
Hemoglobin: 12 g/dL (ref 11.1–15.9)
Immature Grans (Abs): 0 10*3/uL (ref 0.0–0.1)
Immature Granulocytes: 1 %
Lymphocytes Absolute: 1.5 10*3/uL (ref 0.7–3.1)
Lymphs: 26 %
MCH: 21.9 pg — ABNORMAL LOW (ref 26.6–33.0)
MCHC: 31.3 g/dL — ABNORMAL LOW (ref 31.5–35.7)
MCV: 70 fL — ABNORMAL LOW (ref 79–97)
Monocytes Absolute: 0.3 10*3/uL (ref 0.1–0.9)
Monocytes: 6 %
Neutrophils Absolute: 4 10*3/uL (ref 1.4–7.0)
Neutrophils: 66 %
RBC: 5.48 x10E6/uL — ABNORMAL HIGH (ref 3.77–5.28)
RDW: 15.9 % — ABNORMAL HIGH (ref 12.3–15.4)
WBC: 5.9 10*3/uL (ref 3.4–10.8)

## 2017-08-14 LAB — LIPID PANEL WITH LDL/HDL RATIO
Cholesterol, Total: 127 mg/dL (ref 100–169)
HDL: 39 mg/dL — ABNORMAL LOW
LDL Calculated: 77 mg/dL (ref 0–109)
LDl/HDL Ratio: 2 ratio (ref 0.0–3.2)
Triglycerides: 54 mg/dL (ref 0–89)
VLDL Cholesterol Cal: 11 mg/dL (ref 5–40)

## 2017-08-14 LAB — COMPREHENSIVE METABOLIC PANEL WITH GFR
ALT: 16 [IU]/L (ref 0–32)
AST: 14 [IU]/L (ref 0–40)
Albumin/Globulin Ratio: 1.4 (ref 1.2–2.2)
Albumin: 4.2 g/dL (ref 3.5–5.5)
Alkaline Phosphatase: 98 [IU]/L (ref 39–117)
BUN/Creatinine Ratio: 14 (ref 9–23)
BUN: 9 mg/dL (ref 6–20)
Bilirubin Total: 0.6 mg/dL (ref 0.0–1.2)
CO2: 24 mmol/L (ref 20–29)
Calcium: 9.4 mg/dL (ref 8.7–10.2)
Chloride: 103 mmol/L (ref 96–106)
Creatinine, Ser: 0.63 mg/dL (ref 0.57–1.00)
GFR calc Af Amer: 150 mL/min/{1.73_m2}
GFR calc non Af Amer: 130 mL/min/{1.73_m2}
Globulin, Total: 3.1 g/dL (ref 1.5–4.5)
Glucose: 96 mg/dL (ref 65–99)
Potassium: 4.5 mmol/L (ref 3.5–5.2)
Sodium: 139 mmol/L (ref 134–144)
Total Protein: 7.3 g/dL (ref 6.0–8.5)

## 2017-08-14 LAB — VITAMIN D 25 HYDROXY (VIT D DEFICIENCY, FRACTURES): VIT D 25 HYDROXY: 12.5 ng/mL — AB (ref 30.0–100.0)

## 2017-08-14 LAB — FOLATE: Folate: 9.3 ng/mL

## 2017-08-14 LAB — TSH: TSH: 1.01 u[IU]/mL (ref 0.450–4.500)

## 2017-08-14 LAB — T4, FREE: Free T4: 1.29 ng/dL (ref 0.93–1.60)

## 2017-08-14 LAB — VITAMIN B12: Vitamin B-12: 295 pg/mL (ref 232–1245)

## 2017-08-14 LAB — T3: T3, Total: 169 ng/dL (ref 71–180)

## 2017-08-14 LAB — HEMOGLOBIN A1C
Est. average glucose Bld gHb Est-mCnc: 120 mg/dL
Hgb A1c MFr Bld: 5.8 % — ABNORMAL HIGH (ref 4.8–5.6)

## 2017-08-14 LAB — INSULIN, RANDOM: INSULIN: 42.1 u[IU]/mL — ABNORMAL HIGH (ref 2.6–24.9)

## 2017-08-17 NOTE — Progress Notes (Signed)
Office: [phone]  /  Fax: [phone]   Dear Dr. Brett Fairy,   Thank you for referring Maureen Lewis to our clinic. The following note includes my evaluation and treatment recommendations.  HPI:   Chief Complaint: OBESITY    Maureen Lewis has been referred by Larey Seat, MD for consultation regarding her obesity and obesity related comorbidities.    Maureen Lewis (MR# [id number]) is a 21 y.o. female who presents on 08/13/2017 for obesity evaluation and treatment. Current BMI is Body mass index is 53.3 kg/m.Maureen Lewis has been struggling with her weight for many years and has been unsuccessful in either losing weight, maintaining weight loss, or reaching her healthy weight goal.     Maureen Lewis attended our information session and states she is currently in the action stage of change and ready to dedicate time achieving and maintaining a healthier weight. Maureen Lewis is interested in becoming our patient and working on intensive lifestyle modifications including (but not limited to) diet, exercise and weight loss.    Maureen Lewis states her family eats meals together she thinks her family will eat healthier with  her her desired weight loss is 139 lbs she has been heavy most of  her life she started gaining weight in 2014 her heaviest weight ever was 413 lbs. she is a picky eater and doesn't like to eat healthier foods  she has significant food cravings issues  she snacks frequently in the evenings she wakes up frquently in the middle of the night to eat she skips meals frequently she is frequently drinking liquids with calories she frequently makes poor food choices she has problems with excessive hunger  she frequently eats larger portions than normal  she struggles with emotional eating    Fatigue Maureen Lewis feels her energy is lower than it should be. This has worsened with weight gain and has not worsened recently. Maureen Lewis admits to daytime somnolence and  admits to waking up still tired. Patient is  at risk for obstructive sleep apnea. Patent has a history of symptoms of daytime fatigue and morning headache. Patient generally gets 6 hours of sleep per night, and states they generally have nightime awakenings. Snoring is present. Apneic episodes are present. Epworth Sleepiness Score is 18.  Dyspnea on exertion Ashrita notes increasing shortness of breath with exercising and seems to be worsening over time with weight gain. She notes getting out of breath sooner with activity than she used to. This has not gotten worse recently. Vesta denies orthopnea.  Sleep Apnea Maureen Lewis has mild sleep apnea but worsens in REM sleep. She is not using CPAP yet due to insurance issues.  Polycystic Ovary Syndrome Maureen Lewis has irregular periods and she is currently not on birth control. She states she is not having sex or in a relationship right now.  Depression Screen Maureen Lewis's Food and Mood (modified PHQ-9) score was  Depression screen PHQ 2/9 08/13/2017  Decreased Interest 3  Down, Depressed, Hopeless 2  PHQ - 2 Score 5  Altered sleeping 3  Tired, decreased energy 3  Change in appetite 2  Feeling bad or failure about yourself  1  Trouble concentrating 1  Moving slowly or fidgety/restless 2  Suicidal thoughts 0  PHQ-9 Score 17  Difficult doing work/chores Extremely dIfficult    ALLERGIES: Allergies  Allergen Reactions  . Bee Pollen Swelling, Shortness Of Breath and Hives  . Hydrocodone-Acetaminophen Anaphylaxis  . Peach Flavor Hives, Shortness Of Breath and Other (See Comments)    SWELLING OF MOUTH  . Pollen  Extract Hives, Shortness Of Breath and Swelling    MEDICATIONS: Current Outpatient Prescriptions on File Prior to Visit  Medication Sig Dispense Refill  . diclofenac (VOLTAREN) 75 MG EC tablet Take 1 tablet (75 mg total) by mouth 2 (two) times daily as needed for moderate pain. 20 tablet 0  . dicyclomine (BENTYL) 20 MG tablet Take 20 mg by mouth every 6 (six) hours as needed for spasms.       Marland Kitchen ibuprofen (ADVIL,MOTRIN) 600 MG tablet Take 600 mg by mouth every 6 (six) hours as needed for headache or moderate pain.    Marland Kitchen loratadine (CLARITIN) 10 MG tablet Take 10 mg by mouth daily as needed for allergies.     Marland Kitchen omeprazole (PRILOSEC) 40 MG capsule TAKE ONE CAPSULE BY MOUTH  DAILY (Patient taking differently: TAKE 40 MG BY MOUTH  DAILY) 30 capsule 3  . polyethylene glycol (MIRALAX / GLYCOLAX) packet Take 17 g by mouth daily as needed for moderate constipation.     . topiramate (TOPAMAX) 25 MG tablet Take 1 tablet (25 mg total) by mouth 2 times daily at 12 noon and 4 pm. 60 tablet 11   No current facility-administered medications on file prior to visit.     PAST MEDICAL HISTORY: Past Medical History:  Diagnosis Date  . Acid reflux   . Anemia    no current med.  . Complication of anesthesia    states had to keep giving her anesthesia during EGD  . Constipation   . Finger mass, left 03/2017   middle finger  . Irritable bowel syndrome (IBS)   . Morbid obesity with body mass index (BMI) of 45.0 to 49.9 in adult [facility])   . Plantar fasciitis, bilateral   . Polycystic ovary syndrome   . Shortness of breath   . Sleep apnea    no CPAP use    PAST SURGICAL HISTORY: Past Surgical History:  Procedure Laterality Date  . COLONOSCOPY WITH PROPOFOL  10/07/2016  . ESOPHAGOGASTRODUODENOSCOPY  12/21/2015  . EXCISION MASS UPPER EXTREMETIES Left 04/21/2017   Procedure: EXCISION MASS LEFT MIDDLE FINGER;  Surgeon: Daryll Brod, MD;  Location: [place];  Service: Orthopedics;  Laterality: Left;    SOCIAL HISTORY: Social History  Substance Use Topics  . Smoking status: Never Smoker  . Smokeless tobacco: Never Used  . Alcohol use Yes     Comment: social     FAMILY HISTORY: Family History  Problem Relation Age of Onset  . Diabetes Maternal Aunt   . Hypertension Maternal Uncle   . Depression Maternal Grandfather   . Diabetes Maternal Grandfather   . Hypertension  Maternal Grandfather   . Arthritis Mother   . High blood pressure Mother   . Depression Mother   . Sleep apnea Mother   . Obesity Mother     ROS: Review of Systems  Constitutional: Positive for malaise/fatigue. Negative for weight loss.       Trouble sleeping Breasts pain  HENT: Positive for tinnitus.   Eyes:       Wear glasses or contacts  Respiratory: Positive for shortness of breath (with exertion).   Cardiovascular: Negative for orthopnea.  Neurological: Positive for headaches.  Psychiatric/Behavioral: Positive for depression. Negative for suicidal ideas.    PHYSICAL EXAM: Blood pressure 135/72, pulse 70, temperature 98.1 F (36.7 C), temperature source Oral, height 6\' 1"  (1.854 m), weight (!) 404 lb (183.3 kg), last menstrual period 07/17/2017, SpO2 98 %. Body mass index is 53.3 kg/m. Physical  Exam  Constitutional: She is oriented to person, place, and time. She appears well-developed and well-nourished.  Cardiovascular: Normal rate.   Pulmonary/Chest: Effort normal.  Musculoskeletal: Normal range of motion.  Neurological: She is oriented to person, place, and time.  Skin: Skin is warm and dry.  Psychiatric: She has a normal mood and affect.  Vitals reviewed.   RECENT LABS AND TESTS: BMET    Component Value Date/Time   NA 139 08/13/2017 0959   K 4.5 08/13/2017 0959   CL 103 08/13/2017 0959   CO2 24 08/13/2017 0959   GLUCOSE 96 08/13/2017 0959   GLUCOSE 101 (H) 05/28/2017 1011   BUN 9 08/13/2017 0959   CREATININE 0.63 08/13/2017 0959   CREATININE 0.53 02/23/2015 1002   CALCIUM 9.4 08/13/2017 0959   GFRNONAA 130 08/13/2017 0959   GFRAA 150 08/13/2017 0959   Lab Results  Component Value Date   HGBA1C 5.8 (H) 08/13/2017   Lab Results  Component Value Date   INSULIN 42.1 (H) 08/13/2017   CBC    Component Value Date/Time   WBC 5.9 08/13/2017 0959   WBC 6.7 05/28/2017 1011   RBC 5.48 (H) 08/13/2017 0959   RBC 5.29 05/28/2017 1011   HGB 12.0  08/13/2017 0959   HCT 38.3 08/13/2017 0959   PLT 262.0 05/28/2017 1011   MCV 70 (L) 08/13/2017 0959   MCH 21.9 (L) 08/13/2017 0959   MCH 21.2 (L) 07/26/2015 1315   MCHC 31.3 (L) 08/13/2017 0959   MCHC 30.8 (L) 05/28/2017 1011   RDW 15.9 (H) 08/13/2017 0959   LYMPHSABS 1.5 08/13/2017 0959   MONOABS 0.5 05/28/2017 1011   EOSABS 0.0 08/13/2017 0959   BASOSABS 0.0 08/13/2017 0959   Iron/TIBC/Ferritin/ %Sat No results found for: IRON, TIBC, FERRITIN, IRONPCTSAT Lipid Panel     Component Value Date/Time   CHOL 127 08/13/2017 0959   TRIG 54 08/13/2017 0959   HDL 39 (L) 08/13/2017 0959   CHOLHDL 3.4 02/23/2015 1002   VLDL 10 02/23/2015 1002   LDLCALC 77 08/13/2017 0959   Hepatic Function Panel     Component Value Date/Time   PROT 7.3 08/13/2017 0959   ALBUMIN 4.2 08/13/2017 0959   AST 14 08/13/2017 0959   ALT 16 08/13/2017 0959   ALKPHOS 98 08/13/2017 0959   BILITOT 0.6 08/13/2017 0959   BILIDIR 0.1 05/28/2017 1011      Component Value Date/Time   TSH 1.010 08/13/2017 0959   TSH 1.29 05/28/2017 1011   TSH 1.21 07/23/2016   TSH 1.913 04/13/2015 0938    ECG  shows NSR with a rate of 81 BPM INDIRECT CALORIMETER done today shows a VO2 of 401 and a REE of 2791.  Her calculated basal metabolic rate is 4431 thus her basal metabolic rate is worse than expected.    ASSESSMENT AND PLAN: Other fatigue - Plan: EKG 12-Lead, Vitamin B12, CBC With Differential, Comprehensive metabolic panel, Folate, Hemoglobin A1c, Insulin, random, Lipid Panel With LDL/HDL Ratio, T3, T4, free, TSH, VITAMIN D 25 Hydroxy (Vit-D Deficiency, Fractures)  Shortness of breath on exertion - Plan: Lipid Panel With LDL/HDL Ratio  Obstructive sleep apnea syndrome - Plan: Hemoglobin A1c  PCOS (polycystic ovarian syndrome)  Depression screening  Class 3 severe obesity with serious comorbidity and body mass index (BMI) of 50.0 to 59.9 in adult, unspecified obesity type (HCC)  PLAN:  Fatigue Maureen Lewis was  informed that her fatigue may be related to obesity, depression or many other causes. Labs will be ordered, and  in the meanwhile Arlyce has agreed to work on diet, exercise and weight loss to help with fatigue. Proper sleep hygiene was discussed including the need for 7-8 hours of quality sleep each night. A sleep study was not ordered based on symptoms and Epworth score.  Dyspnea on exertion Maureen Lewis's shortness of breath appears to be obesity related and exercise induced. She has agreed to work on weight loss and gradually increase exercise to treat her exercise induced shortness of breath. If Zaelyn follows our instructions and loses weight without improvement of her shortness of breath, we will plan to refer to pulmonology. We will monitor this condition regularly. Maureen Lewis agrees to this plan.  Sleep Apnea Jayelle will work on weight loss to help with sleep apnea and will follow closely. She agrees to follow up with our clinic in 2 weeks.  Polycystic Ovary Syndrome Maureen Lewis will work on diet and exercise to help with weight loss. We will check labs and she agrees to follow up with our clinic in 2 weeks.  Depression Screen Maureen Lewis had a strongly positive depression screening. Depression is commonly associated with obesity and often results in emotional eating behaviors. We will monitor this closely and work on CBT to help improve the non-hunger eating patterns. Referral to Psychology may be required if no improvement is seen as she continues in our clinic.  Obesity Maureen Lewis is currently in the action stage of change and her goal is to continue with weight loss efforts. I recommend Maureen Lewis begin the structured treatment plan as follows:  She has agreed to follow the Category 3 plan + 300 calories Maureen Lewis has been instructed to eventually work up to a goal of 150 minutes of combined cardio and strengthening exercise per week for weight loss and overall health benefits. We discussed the following Behavioral  Modification Strategies today: increasing lean protein intake, decreasing simple carbohydrates, and work on meal planning and easy cooking plans   She was informed of the importance of frequent follow up visits to maximize her success with intensive lifestyle modifications for her multiple health conditions. She was informed we would discuss her lab results at her next visit unless there is a critical issue that needs to be addressed sooner. Maureen Lewis agreed to keep her next visit at the agreed upon time to discuss these results.  I, Trixie Dredge, am acting as transcriptionist for  Dennard Nip, MD  I have reviewed the above documentation for accuracy and completeness, and I agree with the above. -Dennard Nip, MD      Today's visit was # 1 out of 22.  Starting weight: 404 lbs Starting date: 08/13/17 Today's weight : 404 lbs  Today's date: 08/13/2017 Total lbs lost to date: 0 (Patients must lose 7 lbs in the first 6 months to continue with counseling)   ASK: We discussed the diagnosis of obesity with Maureen Lewis today and Maureen Lewis agreed to give [name] permission to discuss obesity behavioral modification therapy today.  ASSESS: Maureen Lewis has the diagnosis of obesity and her BMI today is 53.31 Maureen Lewis is in the action stage of change   ADVISE: Maureen Lewis was educated on the multiple health risks of obesity as well as the benefit of weight loss to improve her health. She was advised of the need for long term treatment and the importance of lifestyle modifications.  AGREE: Multiple dietary modification options and treatment options were discussed and  Maureen Lewis agreed to follow the Category 3 plan + 300 calories We discussed the following Behavioral Modification  Strategies today: increasing lean protein intake, decreasing simple carbohydrates, and work on meal planning and easy cooking plans

## 2017-08-20 ENCOUNTER — Encounter (HOSPITAL_COMMUNITY): Payer: Self-pay | Admitting: Emergency Medicine

## 2017-08-20 ENCOUNTER — Ambulatory Visit (HOSPITAL_COMMUNITY)
Admission: EM | Admit: 2017-08-20 | Discharge: 2017-08-20 | Disposition: A | Discharge status: Home / Self Care | Payer: BLUE CROSS/BLUE SHIELD | Attending: Emergency Medicine | Admitting: Emergency Medicine

## 2017-08-20 DIAGNOSIS — H6693 Otitis media, unspecified, bilateral: Secondary | ICD-10-CM

## 2017-08-20 DIAGNOSIS — J069 Acute upper respiratory infection, unspecified: Secondary | ICD-10-CM

## 2017-08-20 DIAGNOSIS — K529 Noninfective gastroenteritis and colitis, unspecified: Secondary | ICD-10-CM | POA: Diagnosis not present

## 2017-08-20 DIAGNOSIS — K589 Irritable bowel syndrome without diarrhea: Secondary | ICD-10-CM

## 2017-08-20 DIAGNOSIS — R112 Nausea with vomiting, unspecified: Secondary | ICD-10-CM

## 2017-08-20 LAB — POCT I-STAT, CHEM 8
BUN: 5 mg/dL — AB (ref 6–20)
CHLORIDE: 105 mmol/L (ref 101–111)
CREATININE: 0.6 mg/dL (ref 0.44–1.00)
Calcium, Ion: 1.14 mmol/L — ABNORMAL LOW (ref 1.15–1.40)
Glucose, Bld: 104 mg/dL — ABNORMAL HIGH (ref 65–99)
HEMATOCRIT: 37 % (ref 36.0–46.0)
Hemoglobin: 12.6 g/dL (ref 12.0–15.0)
Potassium: 4 mmol/L (ref 3.5–5.1)
Sodium: 141 mmol/L (ref 135–145)
TCO2: 26 mmol/L (ref 22–32)

## 2017-08-20 MED ORDER — SULFAMETHOXAZOLE-TRIMETHOPRIM 800-160 MG PO TABS: 1.0000 | ORAL_TABLET | Freq: Two times a day (BID) | ORAL | 0 refills | Status: AC

## 2017-08-20 MED ORDER — ONDANSETRON 8 MG PO TBDP: 8.0000 mg | ORAL_TABLET | Freq: Three times a day (TID) | ORAL | 0 refills | Status: DC | PRN

## 2017-08-20 NOTE — ED Provider Notes (Addendum)
[place]    CSN: [id number] Arrival date & time: 08/20/17  1345     History   Chief Complaint Chief Complaint  Patient presents with  . Generalized Body Aches  . Cough  . Emesis    HPI Maureen Lewis is a 21 y.o. female.   Patient is a 21 year old female with past history of acid reflux, anemia, constipation, and irritable bowel syndrome who presents with complaint of 3 days of not feeling well. Patient reports hot and cold chills, feeling weak today, vomiting 3 times yesterday with a small amount of blood and it and then coughing today with streak of red and yellow mucus. Patient also reports chest wall pain that feels like it goes to her back and is worse with cough that is fairly constant. She denies any shortness of breath or abdominal pain. She does report runny nose that started yesterday along with her cough. She denies any fever. She does have a history of acid reflux and is on omeprazole 40 mg a day. She is also being followed by gastroenterology for her annual bowel syndrome and is on Bentyl for that. Patient reports taking occasional ibuprofen but not very often.      Past Medical History:  Diagnosis Date  . Acid reflux   . Anemia    no current med.  . Complication of anesthesia    states had to keep giving her anesthesia during EGD  . Constipation   . Finger mass, left 03/2017   middle finger  . Irritable bowel syndrome (IBS)   . Morbid obesity with body mass index (BMI) of 45.0 to 49.9 in adult [facility])   . Plantar fasciitis, bilateral   . Polycystic ovary syndrome   . Shortness of breath   . Sleep apnea    no CPAP use    Patient Active Problem List   Diagnosis Date Noted  . Other fatigue 08/13/2017  . Shortness of breath on exertion 08/13/2017  . Obstructive sleep apnea syndrome 08/13/2017  . PCOS (polycystic ovarian syndrome) 08/13/2017  . Pseudotumor cerebri syndrome 08/28/2016  . Super obesity 08/28/2016  . Obesity hypoventilation  syndrome ([place]) 08/28/2016  . Benign paroxysmal positional vertigo due to bilateral vestibular disorder 08/28/2016  . OSA (obstructive sleep apnea) 08/28/2016  . Hypersomnia with sleep apnea 08/28/2016  . Female hirsutism 08/28/2016  . Wrist pain 05/30/2015  . Back pain 04/16/2015  . Amenorrhea 04/13/2015  . Allergic cough 03/28/2015  . Sexually active at young age 55/04/2015  . GERD (gastroesophageal reflux disease) 02/24/2015  . Constipation 10/02/2014  . Shoulder pain, left 01/29/2014  . Bilateral hand numbness 11/14/2013  . Elevated fasting glucose 05/20/2012  . [place] (well child check) 05/20/2012    Past Surgical History:  Procedure Laterality Date  . COLONOSCOPY WITH PROPOFOL  10/07/2016  . ESOPHAGOGASTRODUODENOSCOPY  12/21/2015  . EXCISION MASS UPPER EXTREMETIES Left 04/21/2017   Procedure: EXCISION MASS LEFT MIDDLE FINGER;  Surgeon: Daryll Brod, MD;  Location: [place];  Service: Orthopedics;  Laterality: Left;    OB History    Gravida Para Term Preterm AB Living   0 0 0 0 0 0   SAB TAB Ectopic Multiple Live Births   0 0 0 0 0       Home Medications    Prior to Admission medications   Medication Sig Start Date End Date Taking? Authorizing Provider  diclofenac (VOLTAREN) 75 MG EC tablet Take 1 tablet (75 mg total) by mouth 2 (two)  times daily as needed for moderate pain. 03/03/17  Yes Amyot, Nicholes Stairs, NP  dicyclomine (BENTYL) 20 MG tablet Take 20 mg by mouth every 6 (six) hours as needed for spasms.    Yes [provider]  ibuprofen (ADVIL,MOTRIN) 600 MG tablet Take 600 mg by mouth every 6 (six) hours as needed for headache or moderate pain.   Yes [provider]  loratadine (CLARITIN) 10 MG tablet Take 10 mg by mouth daily as needed for allergies.    Yes [provider]  omeprazole (PRILOSEC) 40 MG capsule TAKE ONE CAPSULE BY MOUTH  DAILY Patient taking differently: TAKE 40 MG BY MOUTH  DAILY 10/16/15  Yes Luiz Blare Y, DO    polyethylene glycol (MIRALAX / GLYCOLAX) packet Take 17 g by mouth daily as needed for moderate constipation.    Yes [provider]  topiramate (TOPAMAX) 25 MG tablet Take 1 tablet (25 mg total) by mouth 2 times daily at 12 noon and 4 pm. 07/01/17  Yes Dohmeier, Asencion Partridge, MD  ondansetron (ZOFRAN ODT) 8 MG disintegrating tablet Take 1 tablet (8 mg total) by mouth every 8 (eight) hours as needed for nausea or vomiting. 08/20/17   Luvenia Redden, PA-C  sulfamethoxazole-trimethoprim (BACTRIM DS,SEPTRA DS) 800-160 MG tablet Take 1 tablet by mouth 2 (two) times daily. 08/20/17 08/27/17  Luvenia Redden, PA-C    Family History Family History  Problem Relation Age of Onset  . Diabetes Maternal Aunt   . Hypertension Maternal Uncle   . Depression Maternal Grandfather   . Diabetes Maternal Grandfather   . Hypertension Maternal Grandfather   . Arthritis Mother   . High blood pressure Mother   . Depression Mother   . Sleep apnea Mother   . Obesity Mother     Social History Social History  Substance Use Topics  . Smoking status: Never Smoker  . Smokeless tobacco: Never Used  . Alcohol use Yes     Comment: social      Allergies   Bee pollen; Hydrocodone-acetaminophen; Peach flavor; and Pollen extract   Review of Systems Review of Systems  As noted above in history of present illness. Other system reviewed and found to be negative.   Physical Exam Triage Vital Signs ED Triage Vitals  Enc Vitals Group     BP 08/20/17 1354 105/75     Pulse Rate 08/20/17 1354 93     Resp 08/20/17 1354 14     Temp 08/20/17 1354 98.9 F (37.2 C)     Temp src --      SpO2 08/20/17 1354 97 %     Weight --      Height --      Head Circumference --      Peak Flow --      Pain Score 08/20/17 1356 8     Pain Loc --      Pain Edu? --      Excl. in [place]? --    No data found.   Updated Vital Signs BP 105/75   Pulse 93   Temp 98.9 F (37.2 C)   Resp 14   LMP 08/17/2017   SpO2 97%    Visual Acuity Right Eye Distance:   Left Eye Distance:   Bilateral Distance:    Right Eye Near:   Left Eye Near:    Bilateral Near:     Physical Exam  Constitutional: She is oriented to person, place, and time. She appears well-developed. No distress.  Obese   HENT:  Right Ear: No mastoid tenderness. Tympanic membrane is injected.  Left Ear: There is mastoid tenderness. Tympanic membrane is injected.  Nose: Rhinorrhea present. Right sinus exhibits no maxillary sinus tenderness and no frontal sinus tenderness. Left sinus exhibits no maxillary sinus tenderness and no frontal sinus tenderness.  Mouth/Throat: Uvula is midline. Posterior oropharyngeal erythema present. No posterior oropharyngeal edema.  Some postnasal drainage.  Eyes: Pupils are equal, round, and reactive to light. EOM are normal.  Cardiovascular: Normal rate and normal heart sounds.  Exam reveals no friction rub.   No murmur heard. Pulmonary/Chest: Effort normal and breath sounds normal. No respiratory distress. She has no wheezes. She exhibits tenderness (reproducible with palpation and deep breath against resistance.).  Abdominal: Soft. Bowel sounds are normal. She exhibits no distension. There is no tenderness. There is no guarding.  Neurological: She is alert and oriented to person, place, and time. No cranial nerve deficit.     UC Treatments / Results  Labs (all labs ordered are listed, but only abnormal results are displayed) Labs Reviewed  POCT I-STAT, CHEM 8 - Abnormal; Notable for the following:       Result Value   BUN 5 (*)    Glucose, Bld 104 (*)    Calcium, Ion 1.14 (*)    All other components within normal limits    EKG  EKG Interpretation None       Radiology No results found.  Procedures Procedures (including critical care time)  Medications Ordered in UC Medications - No data to display   Initial Impression / Assessment and Plan / UC Course  I have reviewed the triage vital  signs and the nursing notes.  Pertinent labs & imaging results that were available during my care of the patient were reviewed by me and considered in my medical decision making (see chart for details).    Patient with upper history symptoms 3 days thank cough congestion runny nose. Exam with bilateral ear infection and left mastoid tenderness. Reproducible chest pain  Final Clinical Impressions(s) / UC Diagnoses   Final diagnoses:  Gastroenteritis  Irritable bowel syndrome, unspecified type  Upper respiratory tract infection, unspecified type  Nausea and vomiting, intractability of vomiting not specified, unspecified vomiting type  Bilateral otitis media, unspecified otitis media type   Patient with history of GERD on omeprazole. Vomiting possibly related to her GERD and/or gastroenteritis. Will recommend fluid and bland diet. Continue her omeprazole. After follow-up with her gastroenterologist if not improved. For upper respirator symptoms and ear infection give a course of Bactrim and will provide her with Zofran for her nausea. New Prescriptions Discharge Medication List as of 08/20/2017  2:42 PM    START taking these medications   Details  ondansetron (ZOFRAN ODT) 8 MG disintegrating tablet Take 1 tablet (8 mg total) by mouth every 8 (eight) hours as needed for nausea or vomiting., Starting Thu 08/20/2017, Normal    sulfamethoxazole-trimethoprim (BACTRIM DS,SEPTRA DS) 800-160 MG tablet Take 1 tablet by mouth 2 (two) times daily., Starting Thu 08/20/2017, Until Thu 08/27/2017, Normal         Controlled Substance Prescriptions [place] Controlled Substance Registry consulted? Not Applicable   Luvenia Redden, PA-C 08/20/17 1445    Luvenia Redden, PA-C 08/20/17 1507

## 2017-08-20 NOTE — ED Triage Notes (Signed)
Pt c/o itchy throat, coughing, taking nyquil and dayquil. Pt states she was throwing up yesterday, saw some streaks of blood in it. Also coughing with streaks of blood. Feeling weak.

## 2017-08-20 NOTE — Discharge Instructions (Signed)
-  Bactrim: one tablet twice a day until done -Zofran: one tablet under tongue every 8 hours as needed for nausea -bland diet as attached -Tylenol and ibuprofen for pain -push fluids -if no improvement in nausea and vomiting, follow up with your gastroenterologist -Follow up with PCP or this clinic should symptoms worsen or not improve

## 2017-08-26 ENCOUNTER — Ambulatory Visit (INDEPENDENT_AMBULATORY_CARE_PROVIDER_SITE_OTHER): Payer: BLUE CROSS/BLUE SHIELD | Admitting: Family Medicine

## 2017-08-26 VITALS — BP 113/68 | HR 63 | Temp 97.6°F | Ht 73.0 in | Wt >= 6400 oz

## 2017-08-26 DIAGNOSIS — Z6841 Body Mass Index (BMI) 40.0 and over, adult: Secondary | ICD-10-CM

## 2017-08-26 DIAGNOSIS — R7303 Prediabetes: Secondary | ICD-10-CM

## 2017-08-26 DIAGNOSIS — E559 Vitamin D deficiency, unspecified: Secondary | ICD-10-CM | POA: Diagnosis not present

## 2017-08-26 MED ORDER — VITAMIN D (ERGOCALCIFEROL) 1.25 MG (50000 UNIT) PO CAPS: 50000.0000 [IU] | ORAL_CAPSULE | ORAL | 0 refills | Status: DC

## 2017-08-26 MED ORDER — METFORMIN HCL 500 MG PO TABS: 500.0000 mg | ORAL_TABLET | Freq: Every day | ORAL | 0 refills | Status: DC

## 2017-08-26 NOTE — Progress Notes (Signed)
Office: [phone]  /  Fax: [phone]   HPI:   Chief Complaint: OBESITY Maureen Lewis is here to discuss her progress with her obesity treatment plan. She is on the Category 3 plan + 300 calories and is following her eating plan approximately 70 % of the time. She states she is exercising 0 minutes 0 times per week. Maureen Lewis did well following her plan the first week but then got very sick and fell off track. She is retaining fluid today due to her increase sodium intake but she is ready to get back on track. She had some hunger issues.  Her weight is (!) 406 lb (184.2 kg) today and has gained 2 pounds since her last visit. She has lost 0 lbs since starting treatment with [name].  Vitamin D deficiency Maureen Lewis has a new diagnosis of vitamin D deficiency. She is not currently taking vit D, she notes fatigue and denies nausea, vomiting or muscle weakness.  Pre-Diabetes Maureen Lewis has a diagnosis of pre-diabetes based on her elevated Hgb A1c and insulin and was informed this puts her at greater risk of developing diabetes. She is not taking metformin currently and continues to work on diet and exercise to decrease risk of diabetes. She notes polyphagia and denies nausea or hypoglycemia.  ALLERGIES: Allergies  Allergen Reactions  . Bee Pollen Swelling, Shortness Of Breath and Hives  . Hydrocodone-Acetaminophen Anaphylaxis  . Peach Flavor Hives, Shortness Of Breath and Other (See Comments)    SWELLING OF MOUTH  . Pollen Extract Hives, Shortness Of Breath and Swelling    MEDICATIONS: Current Outpatient Medications on File Prior to Visit  Medication Sig Dispense Refill  . diclofenac (VOLTAREN) 75 MG EC tablet Take 1 tablet (75 mg total) by mouth 2 (two) times daily as needed for moderate pain. 20 tablet 0  . dicyclomine (BENTYL) 20 MG tablet Take 20 mg by mouth every 6 (six) hours as needed for spasms.     Marland Kitchen ibuprofen (ADVIL,MOTRIN) 600 MG tablet Take 600 mg by mouth every 6 (six) hours as needed for  headache or moderate pain.    Marland Kitchen loratadine (CLARITIN) 10 MG tablet Take 10 mg by mouth daily as needed for allergies.     Marland Kitchen omeprazole (PRILOSEC) 40 MG capsule TAKE ONE CAPSULE BY MOUTH  DAILY (Patient taking differently: TAKE 40 MG BY MOUTH  DAILY) 30 capsule 3  . ondansetron (ZOFRAN ODT) 8 MG disintegrating tablet Take 1 tablet (8 mg total) by mouth every 8 (eight) hours as needed for nausea or vomiting. 20 tablet 0  . polyethylene glycol (MIRALAX / GLYCOLAX) packet Take 17 g by mouth daily as needed for moderate constipation.     . sulfamethoxazole-trimethoprim (BACTRIM DS,SEPTRA DS) 800-160 MG tablet Take 1 tablet by mouth 2 (two) times daily. 14 tablet 0  . topiramate (TOPAMAX) 25 MG tablet Take 1 tablet (25 mg total) by mouth 2 times daily at 12 noon and 4 pm. 60 tablet 11   No current facility-administered medications on file prior to visit.     PAST MEDICAL HISTORY: Past Medical History:  Diagnosis Date  . Acid reflux   . Anemia    no current med.  . Complication of anesthesia    states had to keep giving her anesthesia during EGD  . Constipation   . Finger mass, left 03/2017   middle finger  . Irritable bowel syndrome (IBS)   . Morbid obesity with body mass index (BMI) of 45.0 to 49.9 in adult [facility])   .  Plantar fasciitis, bilateral   . Polycystic ovary syndrome   . Shortness of breath   . Sleep apnea    no CPAP use    PAST SURGICAL HISTORY: Past Surgical History:  Procedure Laterality Date  . COLONOSCOPY WITH PROPOFOL  10/07/2016  . ESOPHAGOGASTRODUODENOSCOPY  12/21/2015    SOCIAL HISTORY: Social History   Tobacco Use  . Smoking status: Never Smoker  . Smokeless tobacco: Never Used  Substance Use Topics  . Alcohol use: Yes    Comment: social   . Drug use: No    FAMILY HISTORY: Family History  Problem Relation Age of Onset  . Diabetes Maternal Aunt   . Hypertension Maternal Uncle   . Depression Maternal Grandfather   . Diabetes Maternal Grandfather   .  Hypertension Maternal Grandfather   . Arthritis Mother   . High blood pressure Mother   . Depression Mother   . Sleep apnea Mother   . Obesity Mother     ROS: Review of Systems  Constitutional: Positive for malaise/fatigue. Negative for weight loss.  Gastrointestinal: Negative for nausea and vomiting.  Musculoskeletal:       Negative muscle weakness  Endo/Heme/Allergies:       Positive polyphagia Negative hypoglycemia    PHYSICAL EXAM: Blood pressure 113/68, pulse 63, temperature 97.6 F (36.4 C), temperature source Oral, height 6\' 1"  (1.854 m), weight (!) 406 lb (184.2 kg), last menstrual period 08/17/2017, SpO2 98 %. Body mass index is 53.57 kg/m. Physical Exam  Constitutional: She is oriented to person, place, and time. She appears well-developed and well-nourished.  Cardiovascular: Normal rate.  Pulmonary/Chest: Effort normal.  Musculoskeletal: Normal range of motion.  Neurological: She is oriented to person, place, and time.  Skin: Skin is warm and dry.  Psychiatric: She has a normal mood and affect. Her behavior is normal.  Vitals reviewed.   RECENT LABS AND TESTS: BMET    Component Value Date/Time   NA 141 08/20/2017 1459   NA 139 08/13/2017 0959   K 4.0 08/20/2017 1459   CL 105 08/20/2017 1459   CO2 24 08/13/2017 0959   GLUCOSE 104 (H) 08/20/2017 1459   BUN 5 (L) 08/20/2017 1459   BUN 9 08/13/2017 0959   CREATININE 0.60 08/20/2017 1459   CREATININE 0.53 02/23/2015 1002   CALCIUM 9.4 08/13/2017 0959   GFRNONAA 130 08/13/2017 0959   GFRAA 150 08/13/2017 0959   Lab Results  Component Value Date   HGBA1C 5.8 (H) 08/13/2017   HGBA1C 5.9 03/08/2014   HGBA1C 5.8 05/20/2012   Lab Results  Component Value Date   INSULIN 42.1 (H) 08/13/2017   CBC    Component Value Date/Time   WBC 5.9 08/13/2017 0959   WBC 6.7 05/28/2017 1011   RBC 5.48 (H) 08/13/2017 0959   RBC 5.29 05/28/2017 1011   HGB 12.6 08/20/2017 1459   HGB 12.0 08/13/2017 0959   HCT 37.0  08/20/2017 1459   HCT 38.3 08/13/2017 0959   PLT 262.0 05/28/2017 1011   MCV 70 (L) 08/13/2017 0959   MCH 21.9 (L) 08/13/2017 0959   MCH 21.2 (L) 07/26/2015 1315   MCHC 31.3 (L) 08/13/2017 0959   MCHC 30.8 (L) 05/28/2017 1011   RDW 15.9 (H) 08/13/2017 0959   LYMPHSABS 1.5 08/13/2017 0959   MONOABS 0.5 05/28/2017 1011   EOSABS 0.0 08/13/2017 0959   BASOSABS 0.0 08/13/2017 0959   Iron/TIBC/Ferritin/ %Sat No results found for: IRON, TIBC, FERRITIN, IRONPCTSAT Lipid Panel     Component Value  Date/Time   CHOL 127 08/13/2017 0959   TRIG 54 08/13/2017 0959   HDL 39 (L) 08/13/2017 0959   CHOLHDL 3.4 02/23/2015 1002   VLDL 10 02/23/2015 1002   LDLCALC 77 08/13/2017 0959   Hepatic Function Panel     Component Value Date/Time   PROT 7.3 08/13/2017 0959   ALBUMIN 4.2 08/13/2017 0959   AST 14 08/13/2017 0959   ALT 16 08/13/2017 0959   ALKPHOS 98 08/13/2017 0959   BILITOT 0.6 08/13/2017 0959   BILIDIR 0.1 05/28/2017 1011      Component Value Date/Time   TSH 1.010 08/13/2017 0959   TSH 1.29 05/28/2017 1011   TSH 1.21 07/23/2016   TSH 1.913 04/13/2015 0938    ASSESSMENT AND PLAN: Vitamin D deficiency - Plan: Vitamin D, Ergocalciferol, (DRISDOL) 50000 units CAPS capsule  Prediabetes - Plan: metFORMIN (GLUCOPHAGE) 500 MG tablet  Class 3 severe obesity with serious comorbidity and body mass index (BMI) of 50.0 to 59.9 in adult, unspecified obesity type ([place])  PLAN:  Vitamin D Deficiency Maureen Lewis was informed that low vitamin D levels contributes to fatigue and are associated with obesity, breast, and colon cancer. Maureen Lewis agrees to start prescription Vit D @50 ,000 IU every week #4 with no refills and will follow up for routine testing of vitamin D, at least 2-3 times per year. She was informed of the risk of over-replacement of vitamin D and agrees to not increase her dose unless he discusses this with [name] first. Maureen Lewis agrees to follow up with our clinic in 2  weeks.  Pre-Diabetes Maureen Lewis will continue to work on weight loss, exercise, and decreasing simple carbohydrates in her diet to help decrease the risk of diabetes. We dicussed metformin including benefits and risks. She was informed that eating too many simple carbohydrates or too many calories at one sitting increases the likelihood of GI side effects. Maureen Lewis agrees to start metformin 500 mg q AM #30 with no refills. Maureen Lewis agrees to follow up with our clinic in 2 weeks as directed to monitor her progress.  Obesity Maureen Lewis is currently in the action stage of change. As such, her goal is to continue with weight loss efforts She has agreed to follow the Category 3 plan + 300 calories Maureen Lewis has been instructed to work up to a goal of 150 minutes of combined cardio and strengthening exercise per week for weight loss and overall health benefits. We discussed the following Behavioral Modification Strategies today: increasing lean protein intake, decreasing simple carbohydrates, decreasing sodium intake, work on meal planning and easy cooking plans, holiday eating strategies, and increase H20 intake.    Maureen Lewis has agreed to follow up with our clinic in 2 weeks. She was informed of the importance of frequent follow up visits to maximize her success with intensive lifestyle modifications for her multiple health conditions.  I, Maureen Lewis, am acting as transcriptionist for Dennard Nip, MD  I have reviewed the above documentation for accuracy and completeness, and I agree with the above. -Dennard Nip, MD      Today's visit was # 2 out of 72.  Starting weight: 404 lbs Starting date: 08/13/17 Today's weight : 406 lbs  Today's date: 08/26/2017 Total lbs lost to date: 0 (Patients must lose 7 lbs in the first 6 months to continue with counseling)   ASK: We discussed the diagnosis of obesity with [place] today and Greg agreed to give [name] permission to discuss obesity behavioral modification  therapy today.  ASSESS: Shonica  has the diagnosis of obesity and her BMI today is 53.58 Elanora is in the action stage of change   ADVISE: Trenda was educated on the multiple health risks of obesity as well as the benefit of weight loss to improve her health. She was advised of the need for long term treatment and the importance of lifestyle modifications.  AGREE: Multiple dietary modification options and treatment options were discussed and  Skylie agreed to follow the Category 3 plan + 300 calories We discussed the following Behavioral Modification Strategies today: increasing lean protein intake, decreasing simple carbohydrates, decreasing sodium intake, work on meal planning and easy cooking plans, holiday eating strategies, and increase H20 intake.

## 2017-08-31 ENCOUNTER — Institutional Professional Consult (permissible substitution): Payer: BLUE CROSS/BLUE SHIELD | Admitting: Pulmonary Disease

## 2017-09-01 ENCOUNTER — Ambulatory Visit (INDEPENDENT_AMBULATORY_CARE_PROVIDER_SITE_OTHER): Payer: BLUE CROSS/BLUE SHIELD | Admitting: Neurology

## 2017-09-01 DIAGNOSIS — G4733 Obstructive sleep apnea (adult) (pediatric): Secondary | ICD-10-CM | POA: Diagnosis not present

## 2017-09-01 DIAGNOSIS — E669 Obesity, unspecified: Secondary | ICD-10-CM | POA: Diagnosis not present

## 2017-09-01 DIAGNOSIS — H539 Unspecified visual disturbance: Secondary | ICD-10-CM

## 2017-09-01 DIAGNOSIS — R519 Headache, unspecified: Secondary | ICD-10-CM

## 2017-09-01 DIAGNOSIS — G932 Benign intracranial hypertension: Secondary | ICD-10-CM

## 2017-09-01 DIAGNOSIS — R51 Headache: Secondary | ICD-10-CM

## 2017-09-04 ENCOUNTER — Institutional Professional Consult (permissible substitution): Payer: BLUE CROSS/BLUE SHIELD | Admitting: Pulmonary Disease

## 2017-09-07 ENCOUNTER — Other Ambulatory Visit: Payer: Self-pay | Admitting: Neurology

## 2017-09-07 DIAGNOSIS — G932 Benign intracranial hypertension: Secondary | ICD-10-CM

## 2017-09-07 DIAGNOSIS — E669 Obesity, unspecified: Secondary | ICD-10-CM

## 2017-09-07 DIAGNOSIS — G4733 Obstructive sleep apnea (adult) (pediatric): Secondary | ICD-10-CM

## 2017-09-07 NOTE — Procedures (Signed)
PATIENT'S NAME:  Maureen Lewis, Maureen Lewis DOB:      12-07-1995      MR#:    [id number]     DATE OF RECORDING: 09/01/2017 REFERRING M.D.:  Dorothyann Peng, NP Study Performed:   CPAP  Titration HISTORY:  This 21 year old female Patient underwent a PSG on 08/02/17. The findings were mild Obstructive Sleep Apnea (OSA) with strong REM sleep accentuation- AHI in general was 8.4/hr. but in REM sleep AHI became 39.6/hr. The patient's weight 413 pounds with a height of 74 (inches), resulting in a BMI of 53 kg/m2.The patient's neck circumference measured 20 inches.  CURRENT MEDICATIONS: Diclofenac, Dicyclomine, Loratadine, Omeprazole, Polyethylene, Triamcinolone and Topiramate  PROCEDURE:  This is a multichannel digital polysomnogram utilizing the SomnoStar 11.2 system.  Electrodes and sensors were applied and monitored per AASM Specifications.   EEG, EOG, Chin and Limb EMG, were sampled at 200 Hz.  ECG, Snore and Nasal Pressure, Thermal Airflow, Respiratory Effort, CPAP Flow and Pressure, Oximetry was sampled at 50 Hz. Digital video and audio were recorded.      CPAP was initiated at 5 cmH20 with heated humidity per AASM split night standards and pressure was advanced to 8/8cmH20 because of hypopneas, apneas and desaturations.  At a PAP pressure of 8 cmH20, there was a reduction of the AHI to 0.0 with improvement of the above symptoms of obstructive sleep apnea.    Lights Out was at 21:17 and Lights On at 05:30. Total recording time (TRT) was 493.5 minutes, with a total sleep time (TST) of 474.5 minutes. The patient's sleep latency was 12 minutes with 0 minutes of wake time after sleep onset. REM latency was 148 minutes.  The sleep efficiency was 96.1 %.    SLEEP ARCHITECTURE: WASO (Wake after sleep onset) was 12.5 minutes.  There were 9 minutes in Stage N1, 317.5 minutes Stage N2, 58.5 minutes Stage N3 and 89.5 minutes in Stage REM.  The percentage of Stage N1 was 1.9%, Stage N2 was 66.9%, Stage N3 was 12.3% and Stage R  (REM sleep) was 18.9%. The sleep architecture was notable for REM sleep rebound.  RESPIRATORY ANALYSIS:  There was a total of 4 respiratory events: 0 apneas and 4 hypopneas with 0 respiratory event related arousals (RERAs). The total APNEA/HYPOPNEA INDEX (AHI) was 0.5 /hour and the total RESPIRATORY DISTURBANCE INDEX was 0.5/hour.  4 events occurred in REM sleep and 0 events in NREM. The REM AHI was 2.7 /hr. versus a non-REM AHI of 0.0 /hour.  The patient spent 363.5 minutes of total sleep time in the supine position and 111 minutes in non-supine. The supine AHI was 0.7, versus a non-supine AHI of 0.0/hr.  OXYGEN SATURATION & C02:  The baseline 02 saturation was 97%, with the lowest being 90%. Time spent below 89% saturation equaled 0 minutes. PERIODIC LIMB MOVEMENTS:   The patient had a total of 8 Periodic Limb Movements. The Periodic Limb Movement (PLM) index was 1.0 and the PLM Arousal index was 0.1 /hour. The arousals were noted as: 19 were spontaneous, 1 was associated with PLMs, and 4 were associated with respiratory events.  Audio and video analysis did not show any abnormal or unusual movements, behaviors, phonations or vocalizations.    No Snoring was noted. No Nocturia. EKG was in keeping with normal sinus rhythm (NSR).  Post-study, the patient indicated that sleep was better than usual.     DIAGNOSIS . Obstructive Sleep Apnea with REM accentuation responded well to CPAP at 9 cm water  pressure , heated humidity and 2 cm EPR-    The patient was fitted with an  AirFit P10 (Med) by [employer].   PLANS/RECOMMENDATIONS: Obstructive sleep apnea: Recommend a CPAP of 9 cm with EPR of 2 cm H20, with heated humidity. The patient was fitted with an  AirFit P10 (Med) by [employer]. 1. , with follow up at 2 to 3 months to assess response.   a. We recommend or reinforce behavioral treatment for obstructive sleep apnea, which should include: b. Weight loss by diet and exercise - consider Medical Weight  Management. c. Avoidance of medications with muscle relaxant properties and ingestion of alcohol prior to sleeping. d. Avoiding sleeping in the supine position (on one's back). e. Improvement of nasal patency, if indicated.  A follow up appointment will be scheduled in the Sleep Clinic at [facility].   Please call [phone] with any questions.      I certify that I have reviewed the entire raw data recording prior to the issuance of this report in accordance with the Standards of Accreditation of the American Academy of Sleep Medicine (AASM)     Larey Seat, M.D.    09-07-2017  Diplomat, American Board of Psychiatry and Neurology  Diplomat, [place] [facility] Medical Director, [place] [facility]

## 2017-09-08 ENCOUNTER — Telehealth: Payer: Self-pay | Admitting: Neurology

## 2017-09-08 NOTE — Telephone Encounter (Signed)
----- Message from Larey Seat, MD sent at 09/07/2017  2:29 PM EST ----- Study Performed:   CPAP  Titration HISTORY:  This 21 year old female Patient underwent a PSG on 08/02/17. The findings were mild Obstructive Sleep Apnea (OSA) with strong REM sleep accentuation- AHI in general was 8.4/hr. but in REM sleep AHI became 39.6/hr. The patient's weight 413 pounds with a height of 74 (inches), resulting in a BMI of 53 kg/m2.The patient's neck circumference measured 20 inches.  CURRENT MEDICATIONS: Diclofenac, Dicyclomine, Loratadine, Omeprazole, Polyethylene, Triamcinolone and Topiramate  PROCEDURE:  This is a multichannel digital polysomnogram utilizing the SomnoStar 11.2 system.  Electrodes and sensors were applied and monitored per AASM Specifications.   EEG, EOG, Chin and Limb EMG, were sampled at 200 Hz.  ECG, Snore and Nasal Pressure, Thermal Airflow, Respiratory Effort, CPAP Flow and Pressure, Oximetry was sampled at 50 Hz. Digital video and audio were recorded.      CPAP was initiated at 5 cmH20 with heated humidity per AASM split night standards and pressure was advanced to 8/8cmH20 because of hypopneas, apneas and desaturations.  At a PAP pressure of 8 cmH20, there was a reduction of the AHI to 0.0 with improvement of the above symptoms of obstructive sleep apnea.    Lights Out was at 21:17 and Lights On at 05:30. Total recording time (TRT) was 493.5 minutes, with a total sleep time (TST) of 474.5 minutes. The patient's sleep latency was 12 minutes with 0 minutes of wake time after sleep onset. REM latency was 148 minutes.  The sleep efficiency was 96.1 %.    SLEEP ARCHITECTURE: WASO (Wake after sleep onset) was 12.5 minutes.  There were 9 minutes in Stage N1, 317.5 minutes Stage N2, 58.5 minutes Stage N3 and 89.5 minutes in Stage REM.  The percentage of Stage N1 was 1.9%, Stage N2 was 66.9%, Stage N3 was 12.3% and Stage R (REM sleep) was 18.9%. The sleep architecture was notable for REM  sleep rebound.  RESPIRATORY ANALYSIS:  There was a total of 4 respiratory events: 0 apneas and 4 hypopneas with 0 respiratory event related arousals (RERAs). The total APNEA/HYPOPNEA INDEX (AHI) was 0.5 /hour and the total RESPIRATORY DISTURBANCE INDEX was 0.5/hour.  4 events occurred in REM sleep and 0 events in NREM. The REM AHI was 2.7 /hr. versus a non-REM AHI of 0.0 /hour.  The patient spent 363.5 minutes of total sleep time in the supine position and 111 minutes in non-supine. The supine AHI was 0.7, versus a non-supine AHI of 0.0/hr.  OXYGEN SATURATION & C02:  The baseline 02 saturation was 97%, with the lowest being 90%. Time spent below 89% saturation equaled 0 minutes. PERIODIC LIMB MOVEMENTS:   The patient had a total of 8 Periodic Limb Movements. The Periodic Limb Movement (PLM) index was 1.0 and the PLM Arousal index was 0.1 /hour. The arousals were noted as: 19 were spontaneous, 1 was associated with PLMs, and 4 were associated with respiratory events.  Audio and video analysis did not show any abnormal or unusual movements, behaviors, phonations or vocalizations.    No Snoring was noted. No Nocturia. EKG was in keeping with normal sinus rhythm (NSR).  Post-study, the patient indicated that sleep was better than usual.     DIAGNOSIS o Obstructive Sleep Apnea with REM accentuation responded well to CPAP at 9 cm water pressure , heated humidity and 2 cm EPR-    The patient was fitted with an  AirFit P10 (Med) by  [employer].   PLANS/RECOMMENDATIONS: Obstructive sleep apnea: Recommend a CPAP of 9 cm with EPR of 2 cm H20, with heated humidity. The patient was fitted with an  AirFit P10 (Med) by [employer].

## 2017-09-08 NOTE — Telephone Encounter (Signed)
Called the patient to review her sleep study results. No answer. LVM for the patient to call back.  Dr Dohmeier states that the patient has obstuctive sleep apnea. She was treated and responded well to CPAP pressure of 9 cm of water. We will need to send this to a company. Was unable to leave a VM for the pt as mailbox was full. Will try again.

## 2017-09-14 NOTE — Telephone Encounter (Signed)
I called pt. I advised pt that Dr. Brett Fairy reviewed their sleep study results and found that pt has sleep apnea. Dr. Brett Fairy recommends that pt CPAP. I reviewed PAP compliance expectations with the pt. Pt is agreeable to starting a CPAP. I advised pt that an order will be sent to a DME, [facility], and [facility] will call the pt within about one week after they file with the pt's insurance. [facility] will show the pt how to use the machine, fit for masks, and troubleshoot the CPAP if needed. A follow up appt was made for insurance purposes with Cecille Rubin, NP on Dec 14 2017 at 8:45 am. Pt verbalized understanding to arrive 15 minutes early and bring their CPAP. A letter with all of this information in it will be mailed to the pt as a reminder. I verified with the pt that the address we have on file is correct. Pt verbalized understanding of results. Pt had no questions at this time but was encouraged to call back if questions arise.

## 2017-09-16 ENCOUNTER — Ambulatory Visit (INDEPENDENT_AMBULATORY_CARE_PROVIDER_SITE_OTHER): Payer: BLUE CROSS/BLUE SHIELD | Admitting: Family Medicine

## 2017-09-16 VITALS — BP 139/68 | HR 68 | Temp 97.9°F | Ht 73.0 in | Wt >= 6400 oz

## 2017-09-16 DIAGNOSIS — E66813 Obesity, class 3: Secondary | ICD-10-CM | POA: Insufficient documentation

## 2017-09-16 DIAGNOSIS — Z6841 Body Mass Index (BMI) 40.0 and over, adult: Secondary | ICD-10-CM

## 2017-09-16 DIAGNOSIS — F329 Major depressive disorder, single episode, unspecified: Secondary | ICD-10-CM | POA: Insufficient documentation

## 2017-09-16 DIAGNOSIS — R7303 Prediabetes: Secondary | ICD-10-CM

## 2017-09-16 DIAGNOSIS — F32A Depression, unspecified: Secondary | ICD-10-CM | POA: Insufficient documentation

## 2017-09-16 DIAGNOSIS — F3289 Other specified depressive episodes: Secondary | ICD-10-CM | POA: Diagnosis not present

## 2017-09-16 DIAGNOSIS — E559 Vitamin D deficiency, unspecified: Secondary | ICD-10-CM | POA: Insufficient documentation

## 2017-09-16 HISTORY — DX: Prediabetes: R73.03

## 2017-09-16 MED ORDER — BUPROPION HCL ER (SR) 150 MG PO TB12: 150.0000 mg | ORAL_TABLET | Freq: Every day | ORAL | 0 refills | Status: DC

## 2017-09-16 MED ORDER — VITAMIN D (ERGOCALCIFEROL) 1.25 MG (50000 UNIT) PO CAPS: 50000.0000 [IU] | ORAL_CAPSULE | ORAL | 0 refills | Status: DC

## 2017-09-16 MED ORDER — METFORMIN HCL 500 MG PO TABS: 500.0000 mg | ORAL_TABLET | Freq: Every day | ORAL | 0 refills | Status: DC

## 2017-09-16 NOTE — Progress Notes (Signed)
Office: [phone]  /  Fax: [phone]   HPI:   Chief Complaint: OBESITY Maureen Lewis is here to discuss her progress with her obesity treatment plan. She is on the Category 3 plan +300 calories and is following her eating plan approximately 75 % of the time. She states she is exercising 0 minutes 0 times per week. Maureen Lewis is struggling to eat all her food on the category 3 plan. She had increased celebration eating over Thanksgiving and increased temptations at home. Her weight is (!) 407 lb (184.6 kg) today and has had a weight gain of 1 pound over a period of 3 weeks since her last visit. She has gained 3 lbs since starting treatment with [name].  Vitamin D deficiency Maureen Lewis has a diagnosis of vitamin D deficiency. She is currently stable on vit D and denies nausea, vomiting or muscle weakness.  Pre-Diabetes Maureen Lewis has a diagnosis of pre-diabetes based on her elevated Hgb A1c and was informed this puts her at greater risk of developing diabetes. She is stable on metformin currently and continues to work on diet and exercise to decrease risk of diabetes. She denies nausea, vomiting or hypoglycemia.  Depression with emotional eating behaviors Maureen Lewis is struggling with emotional eating and she feels out of control with her cravings. She has no history of seizure disorder. Maureen Lewis struggles with emotional eating and using food for comfort to the extent that it is negatively impacting her health. She often snacks when she is not hungry. Maureen Lewis sometimes feels she is out of control and then feels guilty that she made poor food choices. She has been working on behavior modification techniques to help reduce her emotional eating and has been somewhat successful. She shows no sign of suicidal or homicidal ideations.  Depression screen Maureen Lewis 2/9 08/13/2017 05/30/2015 05/01/2015  Decreased Interest 3 0 0  Down, Depressed, Hopeless 2 0 0  PHQ - 2 Score 5 0 0  Altered sleeping 3 - -  Tired, decreased energy 3 - -    Change in appetite 2 - -  Feeling bad or failure about yourself  1 - -  Trouble concentrating 1 - -  Moving slowly or fidgety/restless 2 - -  Suicidal thoughts 0 - -  PHQ-9 Score 17 - -  Difficult doing work/chores Extremely dIfficult - -      ALLERGIES: Allergies  Allergen Reactions  . Bee Pollen Swelling, Shortness Of Breath and Hives  . Hydrocodone-Acetaminophen Anaphylaxis  . Peach Flavor Hives, Shortness Of Breath and Other (See Comments)    SWELLING OF MOUTH  . Pollen Extract Hives, Shortness Of Breath and Swelling    MEDICATIONS: Current Outpatient Medications on File Prior to Visit  Medication Sig Dispense Refill  . diclofenac (VOLTAREN) 75 MG EC tablet Take 1 tablet (75 mg total) by mouth 2 (two) times daily as needed for moderate pain. 20 tablet 0  . dicyclomine (BENTYL) 20 MG tablet Take 20 mg by mouth every 6 (six) hours as needed for spasms.     Marland Kitchen ibuprofen (ADVIL,MOTRIN) 600 MG tablet Take 600 mg by mouth every 6 (six) hours as needed for headache or moderate pain.    Marland Kitchen loratadine (CLARITIN) 10 MG tablet Take 10 mg by mouth daily as needed for allergies.     . metFORMIN (GLUCOPHAGE) 500 MG tablet Take 1 tablet (500 mg total) daily with breakfast by mouth. 30 tablet 0  . omeprazole (PRILOSEC) 40 MG capsule TAKE ONE CAPSULE BY MOUTH  DAILY (Patient taking  differently: TAKE 40 MG BY MOUTH  DAILY) 30 capsule 3  . ondansetron (ZOFRAN ODT) 8 MG disintegrating tablet Take 1 tablet (8 mg total) by mouth every 8 (eight) hours as needed for nausea or vomiting. 20 tablet 0  . polyethylene glycol (MIRALAX / GLYCOLAX) packet Take 17 g by mouth daily as needed for moderate constipation.     . topiramate (TOPAMAX) 25 MG tablet Take 1 tablet (25 mg total) by mouth 2 times daily at 12 noon and 4 pm. 60 tablet 11  . Vitamin D, Ergocalciferol, (DRISDOL) 50000 units CAPS capsule Take 1 capsule (50,000 Units total) every 7 (seven) days by mouth. 4 capsule 0   No current  facility-administered medications on file prior to visit.     PAST MEDICAL HISTORY: Past Medical History:  Diagnosis Date  . Acid reflux   . Anemia    no current med.  . Complication of anesthesia    states had to keep giving her anesthesia during EGD  . Constipation   . Finger mass, left 03/2017   middle finger  . Irritable bowel syndrome (IBS)   . Morbid obesity with body mass index (BMI) of 45.0 to 49.9 in adult [facility])   . Plantar fasciitis, bilateral   . Polycystic ovary syndrome   . Shortness of breath   . Sleep apnea    no CPAP use    PAST SURGICAL HISTORY: Past Surgical History:  Procedure Laterality Date  . COLONOSCOPY WITH PROPOFOL  10/07/2016  . ESOPHAGOGASTRODUODENOSCOPY  12/21/2015  . EXCISION MASS UPPER EXTREMETIES Left 04/21/2017   Procedure: EXCISION MASS LEFT MIDDLE FINGER;  Surgeon: Daryll Brod, MD;  Location: [facility];  Service: Orthopedics;  Laterality: Left;    SOCIAL HISTORY: Social History   Tobacco Use  . Smoking status: Never Smoker  . Smokeless tobacco: Never Used  Substance Use Topics  . Alcohol use: Yes    Comment: social   . Drug use: No    FAMILY HISTORY: Family History  Problem Relation Age of Onset  . Diabetes Maternal Aunt   . Hypertension Maternal Uncle   . Depression Maternal Grandfather   . Diabetes Maternal Grandfather   . Hypertension Maternal Grandfather   . Arthritis Mother   . High blood pressure Mother   . Depression Mother   . Sleep apnea Mother   . Obesity Mother     ROS: Review of Systems  Constitutional: Negative for weight loss.  Gastrointestinal: Negative for nausea and vomiting.  Musculoskeletal:       Negative muscle weakness  Endo/Heme/Allergies:       Negative hypoglycemia  Psychiatric/Behavioral: Positive for depression. Negative for suicidal ideas.    PHYSICAL EXAM: Blood pressure 139/68, pulse 68, temperature 97.9 F (36.6 C), temperature source Oral, height 6\' 1"  (1.854 m),  weight (!) 407 lb (184.6 kg), last menstrual period 08/17/2017, SpO2 97 %. Body mass index is 53.7 kg/m. Physical Exam  Constitutional: She is oriented to person, place, and time. She appears well-developed and well-nourished.  Cardiovascular: Normal rate.  Pulmonary/Chest: Effort normal.  Musculoskeletal: Normal range of motion.  Neurological: She is oriented to person, place, and time.  Skin: Skin is warm and dry.  Vitals reviewed.   RECENT LABS AND TESTS: BMET    Component Value Date/Time   NA 141 08/20/2017 1459   NA 139 08/13/2017 0959   K 4.0 08/20/2017 1459   CL 105 08/20/2017 1459   CO2 24 08/13/2017 0959   GLUCOSE 104 (  H) 08/20/2017 1459   BUN 5 (L) 08/20/2017 1459   BUN 9 08/13/2017 0959   CREATININE 0.60 08/20/2017 1459   CREATININE 0.53 02/23/2015 1002   CALCIUM 9.4 08/13/2017 0959   GFRNONAA 130 08/13/2017 0959   GFRAA 150 08/13/2017 0959   Lab Results  Component Value Date   HGBA1C 5.8 (H) 08/13/2017   HGBA1C 5.9 03/08/2014   HGBA1C 5.8 05/20/2012   Lab Results  Component Value Date   INSULIN 42.1 (H) 08/13/2017   CBC    Component Value Date/Time   WBC 5.9 08/13/2017 0959   WBC 6.7 05/28/2017 1011   RBC 5.48 (H) 08/13/2017 0959   RBC 5.29 05/28/2017 1011   HGB 12.6 08/20/2017 1459   HGB 12.0 08/13/2017 0959   HCT 37.0 08/20/2017 1459   HCT 38.3 08/13/2017 0959   PLT 262.0 05/28/2017 1011   MCV 70 (L) 08/13/2017 0959   MCH 21.9 (L) 08/13/2017 0959   MCH 21.2 (L) 07/26/2015 1315   MCHC 31.3 (L) 08/13/2017 0959   MCHC 30.8 (L) 05/28/2017 1011   RDW 15.9 (H) 08/13/2017 0959   LYMPHSABS 1.5 08/13/2017 0959   MONOABS 0.5 05/28/2017 1011   EOSABS 0.0 08/13/2017 0959   BASOSABS 0.0 08/13/2017 0959   Iron/TIBC/Ferritin/ %Sat No results found for: IRON, TIBC, FERRITIN, IRONPCTSAT Lipid Panel     Component Value Date/Time   CHOL 127 08/13/2017 0959   TRIG 54 08/13/2017 0959   HDL 39 (L) 08/13/2017 0959   CHOLHDL 3.4 02/23/2015 1002   VLDL 10  02/23/2015 1002   LDLCALC 77 08/13/2017 0959   Hepatic Function Panel     Component Value Date/Time   PROT 7.3 08/13/2017 0959   ALBUMIN 4.2 08/13/2017 0959   AST 14 08/13/2017 0959   ALT 16 08/13/2017 0959   ALKPHOS 98 08/13/2017 0959   BILITOT 0.6 08/13/2017 0959   BILIDIR 0.1 05/28/2017 1011      Component Value Date/Time   TSH 1.010 08/13/2017 0959   TSH 1.29 05/28/2017 1011   TSH 1.21 07/23/2016   TSH 1.913 04/13/2015 0938    ASSESSMENT AND PLAN: Vitamin D deficiency - Plan: Vitamin D, Ergocalciferol, (DRISDOL) 50000 units CAPS capsule  Prediabetes - Plan: metFORMIN (GLUCOPHAGE) 500 MG tablet  Other depression - with emotional eating - Plan: buPROPion (WELLBUTRIN SR) 150 MG 12 hr tablet  Class 3 severe obesity with serious comorbidity and body mass index (BMI) of 50.0 to 59.9 in adult, unspecified obesity type (Maureen Lewis)  PLAN:  Vitamin D Deficiency Maureen Lewis was informed that low vitamin D levels contributes to fatigue and are associated with obesity, breast, and colon cancer. She agrees to continue to take prescription Vit D @50 ,000 IU every week #4 with no refills and will follow up for routine testing of vitamin D, at least 2-3 times per year. She was informed of the risk of over-replacement of vitamin D and agrees to not increase her dose unless he discusses this with [name] first. Maureen Lewis agrees to follow up with our clinic in 3 weeks.  Pre-Diabetes Maureen Lewis will continue to work on weight loss, exercise, and decreasing simple carbohydrates in her diet to help decrease the risk of diabetes. We dicussed metformin including benefits and risks. She was informed that eating too many simple carbohydrates or too many calories at one sitting increases the likelihood of GI side effects. Maureen Lewis requested metformin for now and a prescription was written today for 1 month refill. Maureen Lewis agreed to follow up with [name] as directed  to monitor her progress.  Depression with Emotional Eating Behaviors We  discussed behavior modification techniques today to help Fianna deal with her emotional eating and depression. She has agreed to start Wellbutrin SR 150 mg qam #30 with no refills and will follow up as directed.  Obesity Maureen Lewis is currently in the action stage of change. As such, her goal is to continue with weight loss efforts She has agreed to follow the Category 3 plan +300 calories [place] has been instructed to work up to a goal of 150 minutes of combined cardio and strengthening exercise per week for weight loss and overall health benefits. We discussed the following Behavioral Modification Strategies today: no skipping meals, increasing lean protein intake, decreasing simple carbohydrates and dealing with family or coworker sabotage  Virlee has agreed to follow up with our clinic in 3 weeks. She was informed of the importance of frequent follow up visits to maximize her success with intensive lifestyle modifications for her multiple health conditions.  Maureen Lewis, Maureen Lewis, am acting as transcriptionist for Maureen Nip, MD  Maureen Lewis have reviewed the above documentation for accuracy and completeness, and Maureen Lewis agree with the above. -Maureen Nip, MD   OBESITY BEHAVIORAL INTERVENTION VISIT  Today's visit was # 3 out of 22.  Starting weight: 404 lbs Starting date: 08/13/17 Today's weight : 407 lbs  Today's date: 09/16/2017 Total lbs lost to date: 3 (Patients must lose 7 lbs in the first 6 months to continue with counseling)   ASK: We discussed the diagnosis of obesity with Maureen Lewis today and Maureen Lewis agreed to give [name] permission to discuss obesity behavioral modification therapy today.  ASSESS: Maureen Lewis has the diagnosis of obesity and her BMI today is 53.71 Maureen Lewis is in the action stage of change   ADVISE: Maureen Lewis was educated on the multiple health risks of obesity as well as the benefit of weight loss to improve her health. She was advised of the need for long term treatment and the importance  of lifestyle modifications.  AGREE: Multiple dietary modification options and treatment options were discussed and  Maureen Lewis agreed to follow the Category 3 plan +300 calories We discussed the following Behavioral Modification Strategies today: no skipping meals, increasing lean protein intake, decreasing simple carbohydrates and dealing with family or coworker sabotage

## 2017-09-30 ENCOUNTER — Ambulatory Visit (INDEPENDENT_AMBULATORY_CARE_PROVIDER_SITE_OTHER): Payer: BLUE CROSS/BLUE SHIELD | Admitting: Dietician

## 2017-10-01 ENCOUNTER — Encounter (HOSPITAL_COMMUNITY): Payer: Self-pay | Admitting: Emergency Medicine

## 2017-10-01 ENCOUNTER — Ambulatory Visit (INDEPENDENT_AMBULATORY_CARE_PROVIDER_SITE_OTHER): Payer: BLUE CROSS/BLUE SHIELD | Admitting: Dietician

## 2017-10-01 ENCOUNTER — Ambulatory Visit: Payer: BLUE CROSS/BLUE SHIELD | Admitting: Adult Health

## 2017-10-01 ENCOUNTER — Ambulatory Visit (HOSPITAL_COMMUNITY)
Admission: EM | Admit: 2017-10-01 | Discharge: 2017-10-01 | Disposition: A | Discharge status: Home / Self Care | Payer: BLUE CROSS/BLUE SHIELD | Attending: Emergency Medicine | Admitting: Emergency Medicine

## 2017-10-01 VITALS — Ht 73.0 in | Wt >= 6400 oz

## 2017-10-01 DIAGNOSIS — R7303 Prediabetes: Secondary | ICD-10-CM

## 2017-10-01 DIAGNOSIS — M545 Low back pain, unspecified: Secondary | ICD-10-CM

## 2017-10-01 DIAGNOSIS — Z6841 Body Mass Index (BMI) 40.0 and over, adult: Secondary | ICD-10-CM | POA: Diagnosis not present

## 2017-10-01 DIAGNOSIS — Z9189 Other specified personal risk factors, not elsewhere classified: Secondary | ICD-10-CM

## 2017-10-01 MED ORDER — CYCLOBENZAPRINE HCL 10 MG PO TABS: 10.0000 mg | ORAL_TABLET | Freq: Every day | ORAL | 0 refills | Status: DC

## 2017-10-01 MED ORDER — ONDANSETRON 8 MG PO TBDP: 8.0000 mg | ORAL_TABLET | Freq: Three times a day (TID) | ORAL | 0 refills | Status: DC | PRN

## 2017-10-01 MED ORDER — IBUPROFEN 800 MG PO TABS: 800.0000 mg | ORAL_TABLET | Freq: Three times a day (TID) | ORAL | 0 refills | Status: AC | PRN

## 2017-10-01 NOTE — Discharge Instructions (Signed)
Please take flexeril 10 mg (1 tablet) at night.  During the day please take Ibuprofen 800 mg with Tylenol Extra strength [phone] mg every 8 hours, no more than 3000 mg in one day. Please take with food.  Please continue your efforts to lose weight, use supportive bras to help with any pain contributed by your breasts.   Please follow up with your primary care doctor in 2 weeks if pain not improving.

## 2017-10-01 NOTE — ED Triage Notes (Signed)
Pt sts lower back pain after helping friend move; pt sts OTC meds not helping

## 2017-10-01 NOTE — ED Provider Notes (Signed)
[place]    CSN: [id number] Arrival date & time: 10/01/17  1507     History   Chief Complaint Chief Complaint  Patient presents with  . Back Pain    HPI Maureen Lewis is a 21 y.o. female history of morbid obesity and PCOS presenting with back pain. Back pain has been going on for 1 week and has been worsening. She states she helped a friend move 3-4 prior to onset of symptoms. No specific injury caused onset of symptoms. States it feels okay to lay down but any movement to sit up, stand up is very painful and it takes her a few minutes to do so. Driving and turning also aggravate symptoms. 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 She has tried Ibuprofen 800 mg, Diclofenac, Tylenol, and her mom's cyclobenzaprine, without relief.  Patient is actively seeing a weight loss doctor to try and loose weight. No radiation into legs, no numbness or tingling.   HPI  Past Medical History:  Diagnosis Date  . Acid reflux   . Anemia    no current med.  . Complication of anesthesia    states had to keep giving her anesthesia during EGD  . Constipation   . Finger mass, left 03/2017   middle finger  . Irritable bowel syndrome (IBS)   . Morbid obesity with body mass index (BMI) of 45.0 to 49.9 in adult [facility])   . Plantar fasciitis, bilateral   . Polycystic ovary syndrome   . Shortness of breath   . Sleep apnea    no CPAP use    Patient Active Problem List   Diagnosis Date Noted  . Vitamin D deficiency 09/16/2017  . Prediabetes 09/16/2017  . Depression 09/16/2017  . Class 3 severe obesity with serious comorbidity and body mass index (BMI) of 50.0 to 59.9 in adult ([place]) 09/16/2017  . Other fatigue 08/13/2017  . Shortness of breath on exertion 08/13/2017  . Obstructive sleep apnea syndrome 08/13/2017  . PCOS (polycystic ovarian syndrome) 08/13/2017  . Pseudotumor cerebri syndrome 08/28/2016  . Super obesity 08/28/2016  . Obesity hypoventilation syndrome  ([place]) 08/28/2016  . Benign paroxysmal positional vertigo due to bilateral vestibular disorder 08/28/2016  . OSA (obstructive sleep apnea) 08/28/2016  . Hypersomnia with sleep apnea 08/28/2016  . Female hirsutism 08/28/2016  . Wrist pain 05/30/2015  . Back pain 04/16/2015  . Amenorrhea 04/13/2015  . Allergic cough 03/28/2015  . Sexually active at young age 24/04/2015  . GERD (gastroesophageal reflux disease) 02/24/2015  . Constipation 10/02/2014  . Shoulder pain, left 01/29/2014  . Bilateral hand numbness 11/14/2013  . Elevated fasting glucose 05/20/2012  . [place] (well child check) 05/20/2012    Past Surgical History:  Procedure Laterality Date  . COLONOSCOPY WITH PROPOFOL  10/07/2016  . ESOPHAGOGASTRODUODENOSCOPY  12/21/2015  . EXCISION MASS UPPER EXTREMETIES Left 04/21/2017   Procedure: EXCISION MASS LEFT MIDDLE FINGER;  Surgeon: Daryll Brod, MD;  Location: [facility];  Service: Orthopedics;  Laterality: Left;    OB History    Gravida Para Term Preterm AB Living   0 0 0 0 0 0   SAB TAB Ectopic Multiple Live Births   0 0 0 0 0       Home Medications    Prior to Admission medications   Medication Sig Start Date End Date Taking? Authorizing Provider  buPROPion (WELLBUTRIN SR) 150 MG 12 hr tablet Take 1 tablet (150 mg total) by mouth  daily. 09/16/17   Dennard Nip D, MD  cyclobenzaprine (FLEXERIL) 10 MG tablet Take 1 tablet (10 mg total) by mouth at bedtime. 10/01/17   Wieters, Hallie C, PA-C  diclofenac (VOLTAREN) 75 MG EC tablet Take 1 tablet (75 mg total) by mouth 2 (two) times daily as needed for moderate pain. 03/03/17   Katy Apo, NP  dicyclomine (BENTYL) 20 MG tablet Take 20 mg by mouth every 6 (six) hours as needed for spasms.     [provider]  ibuprofen (ADVIL,MOTRIN) 800 MG tablet Take 1 tablet (800 mg total) by mouth every 8 (eight) hours as needed for up to 14 days for moderate pain. 10/01/17 10/15/17  Wieters, Hallie C, PA-C    loratadine (CLARITIN) 10 MG tablet Take 10 mg by mouth daily as needed for allergies.     [provider]  metFORMIN (GLUCOPHAGE) 500 MG tablet Take 1 tablet (500 mg total) by mouth daily with breakfast. 09/16/17   Dennard Nip D, MD  omeprazole (PRILOSEC) 40 MG capsule TAKE ONE CAPSULE BY MOUTH  DAILY Patient taking differently: TAKE 40 MG BY MOUTH  DAILY 10/16/15   Katheren Shams, DO  ondansetron (ZOFRAN ODT) 8 MG disintegrating tablet Take 1 tablet (8 mg total) by mouth every 8 (eight) hours as needed for nausea or vomiting. 10/01/17   Wieters, Hallie C, PA-C  polyethylene glycol (MIRALAX / GLYCOLAX) packet Take 17 g by mouth daily as needed for moderate constipation.     [provider]  topiramate (TOPAMAX) 25 MG tablet Take 1 tablet (25 mg total) by mouth 2 times daily at 12 noon and 4 pm. 07/01/17   Dohmeier, Asencion [place], MD  Vitamin D, Ergocalciferol, (DRISDOL) 50000 units CAPS capsule Take 1 capsule (50,000 Units total) by mouth every 7 (seven) days. 09/16/17   Starlyn Skeans, MD    Family History Family History  Problem Relation Age of Onset  . Diabetes Maternal Aunt   . Hypertension Maternal Uncle   . Depression Maternal Grandfather   . Diabetes Maternal Grandfather   . Hypertension Maternal Grandfather   . Arthritis Mother   . High blood pressure Mother   . Depression Mother   . Sleep apnea Mother   . Obesity Mother     Social History Social History   Tobacco Use  . Smoking status: Never Smoker  . Smokeless tobacco: Never Used  Substance Use Topics  . Alcohol use: Yes    Comment: social   . Drug use: No     Allergies   Bee pollen; Hydrocodone-acetaminophen; Peach flavor; and Pollen extract   Review of Systems Review of Systems  Constitutional: Negative for chills and fever.  HENT: Negative for ear pain and sore throat.   Eyes: Negative for pain and visual disturbance.  Respiratory: Negative for cough and shortness of breath.    Cardiovascular: Negative for chest pain and palpitations.  Gastrointestinal: Negative for abdominal pain, nausea and vomiting.  Genitourinary: Negative for dysuria and hematuria.  Musculoskeletal: Positive for back pain. Negative for arthralgias.  Skin: Negative for color change and rash.  Neurological: Negative for dizziness, syncope, weakness, light-headedness and headaches.  All other systems reviewed and are negative.    Physical Exam Triage Vital Signs ED Triage Vitals [10/01/17 1520]  Enc Vitals Group     BP (!) 125/52     Pulse Rate 83     Resp 18     Temp 98.1 F (36.7 C)     Temp  Source Oral     SpO2 100 %     Weight      Height      Head Circumference      Peak Flow      Pain Score 8     Pain Loc      Pain Edu?      Excl. in [place]?    No data found.  Updated Vital Signs BP (!) 125/52 (BP Location: Right Arm)   Pulse 83   Temp 98.1 F (36.7 C) (Oral)   Resp 18   SpO2 100%    Physical Exam  Constitutional: She is oriented to person, place, and time. She appears well-developed and well-nourished. No distress.  HENT:  Head: Normocephalic and atraumatic.  Eyes: Conjunctivae are normal.  Neck: Neck supple.  Cardiovascular: Normal rate and regular rhythm.  No murmur heard. Pulmonary/Chest: Effort normal and breath sounds normal. No respiratory distress.  Abdominal: Soft. There is no tenderness.  Musculoskeletal: She exhibits no edema.  Mild tenderness to palpation around L4 and paraspinal muscles, pain more midline. Negative straight leg raise  Neurological: She is alert and oriented to person, place, and time. No cranial nerve deficit.  Skin: Skin is warm and dry.  Psychiatric: She has a normal mood and affect.  Nursing note and vitals reviewed.    UC Treatments / Results  Labs (all labs ordered are listed, but only abnormal results are displayed) Labs Reviewed - No data to display  EKG  EKG Interpretation None       Radiology No results  found.  Procedures Procedures (including critical care time)  Medications Ordered in UC Medications - No data to display   Initial Impression / Assessment and Plan / UC Course  I have reviewed the triage vital signs and the nursing notes.  Pertinent labs & imaging results that were available during my care of the patient were reviewed by me and considered in my medical decision making (see chart for details).     Back pain likely related to increase in activity with move and being overweight. No mechanism of injury suggesting need for imaging. Advised may need more time for improvement of back pain. Ibuprofen 800 mg with food and Tylenol every 8 hours. Heating pad and ice. Flexeril at night. Follow up with primary care in back pain not improving in 2 weeks.   Has constant nausea, refilled zofran.  Final Clinical Impressions(s) / UC Diagnoses   Final diagnoses:  Acute midline low back pain without sciatica    ED Discharge Orders        Ordered    ondansetron (ZOFRAN ODT) 8 MG disintegrating tablet  Every 8 hours PRN     10/01/17 1543    cyclobenzaprine (FLEXERIL) 10 MG tablet  Daily at bedtime     10/01/17 1543    ibuprofen (ADVIL,MOTRIN) 800 MG tablet  Every 8 hours PRN     10/01/17 1544       Controlled Substance Prescriptions [place] Controlled Substance Registry consulted? Not Applicable   Janith Lima, [place] 10/01/17 1812

## 2017-10-05 ENCOUNTER — Institutional Professional Consult (permissible substitution): Payer: BLUE CROSS/BLUE SHIELD | Admitting: Pulmonary Disease

## 2017-10-05 ENCOUNTER — Encounter (INDEPENDENT_AMBULATORY_CARE_PROVIDER_SITE_OTHER): Payer: Self-pay

## 2017-10-05 ENCOUNTER — Ambulatory Visit (INDEPENDENT_AMBULATORY_CARE_PROVIDER_SITE_OTHER): Payer: BLUE CROSS/BLUE SHIELD | Admitting: Family Medicine

## 2017-10-05 NOTE — Progress Notes (Signed)
  Office: [phone]  /  Fax: [phone]     Maureen Lewis has a diagnosis of prediabetes based on her elevated HgA1c and was informed this puts her at greater risk of developing diabetes. She is here today for diabetes risk nutrition counseling which includes her obesity treatment plan.  [name]'s weight today is 402 lbs, a 5 lb weight loss since her last visit. She has had a weight loss of 2 lbs since beginning treatment with [name]. She continues on the Category 3 meal plan + 300 calories and states she is following it approximately 75% of the time. She was prescribed Metformin at her last visit and admits and denies any GI side effects. She reports her hunger is more manageable and she is doing better with her meal plan.    Patient was educated about food nutrients ie protein, fats, simple and complex carbohydrates and how these affect insulin response. Focus on portion control,  avoiding simple carbohydrates and increasing lean proteins for ongoing wt loss efforts and glucose management  Tamia is on the following meal plan: Category 3 + 300 calories.  Her meal plan was individualized for maximum benefit.  Also discussed at length the following behavioral modifications to help maximize success: increasing lean protein intake, decreasing simple carbohydrates, decreasing eating out,  meal planning and cooking strategies, keeping healthy foods in the home, emotional eating strategies,  holiday eating strategies.   [name] has been instructed to work up to a goal of 150 minutes of combined cardio and strengthening exercise per week for weight loss and overall health benefits.    Office: [phone]  /  Fax: [phone]  OBESITY BEHAVIORAL INTERVENTION VISIT  Today's visit was # 4 out of 22.  Starting weight: 404 lbs Starting date: 08/13/17 Today's weight : Weight: (!) 402 lb (182.3 kg)  Today's date:10/01/17 Total lbs lost to date: 2 lbs (Patients must lose 7 lbs in the first 6 months to continue  with counseling)   ASK: We discussed the diagnosis of obesity with [name] today and [name] agreed to give [name] permission to discuss obesity behavioral modification therapy today.  ASSESS: [name] has the diagnosis of obesity and her BMI today is 53.05 [name] is in the action stage of change   ADVISE: [name] was educated on the multiple health risks of obesity as well as the benefit of weight loss to improve her health. She was advised of the need for long term treatment and the importance of lifestyle modifications.  AGREE: Multiple dietary modification options and treatment options were discussed and  [name] agreed to follow the Category 3 plan We discussed the following Behavioral Modification Stratagies today: increasing lean protein intake, decreasing simple carbohydrates , decrease eating out, work on meal planning and easy cooking plans, holiday eating strategies  and emotional eating strategies

## 2017-10-11 ENCOUNTER — Other Ambulatory Visit: Payer: Self-pay

## 2017-10-11 ENCOUNTER — Emergency Department (HOSPITAL_COMMUNITY)
Admission: EM | Admit: 2017-10-11 | Discharge: 2017-10-11 | Disposition: A | Discharge status: Home / Self Care | Payer: BLUE CROSS/BLUE SHIELD | Attending: Emergency Medicine | Admitting: Emergency Medicine

## 2017-10-11 ENCOUNTER — Emergency Department (HOSPITAL_COMMUNITY): Payer: BLUE CROSS/BLUE SHIELD

## 2017-10-11 ENCOUNTER — Encounter (HOSPITAL_COMMUNITY): Payer: Self-pay | Admitting: *Deleted

## 2017-10-11 DIAGNOSIS — Z7984 Long term (current) use of oral hypoglycemic drugs: Secondary | ICD-10-CM | POA: Diagnosis not present

## 2017-10-11 DIAGNOSIS — R7303 Prediabetes: Secondary | ICD-10-CM | POA: Diagnosis not present

## 2017-10-11 DIAGNOSIS — Z79899 Other long term (current) drug therapy: Secondary | ICD-10-CM | POA: Insufficient documentation

## 2017-10-11 DIAGNOSIS — K5900 Constipation, unspecified: Secondary | ICD-10-CM | POA: Insufficient documentation

## 2017-10-11 DIAGNOSIS — M545 Low back pain, unspecified: Secondary | ICD-10-CM

## 2017-10-11 LAB — URINALYSIS, ROUTINE W REFLEX MICROSCOPIC
BILIRUBIN URINE: NEGATIVE
GLUCOSE, UA: NEGATIVE mg/dL
Hgb urine dipstick: NEGATIVE
KETONES UR: NEGATIVE mg/dL
LEUKOCYTES UA: NEGATIVE
Nitrite: NEGATIVE
PH: 7 (ref 5.0–8.0)
Protein, ur: NEGATIVE mg/dL
SPECIFIC GRAVITY, URINE: 1.02 (ref 1.005–1.030)

## 2017-10-11 LAB — POC URINE PREG, ED: Preg Test, Ur: NEGATIVE

## 2017-10-11 MED ORDER — TRAMADOL HCL 50 MG PO TABS: 50.0000 mg | ORAL_TABLET | Freq: Four times a day (QID) | ORAL | 0 refills | Status: DC | PRN

## 2017-10-11 MED ORDER — BISACODYL 10 MG RE SUPP: 10.0000 mg | RECTAL | 0 refills | Status: DC | PRN

## 2017-10-11 MED ORDER — KETOROLAC TROMETHAMINE 60 MG/2ML IM SOLN
60.0000 mg | Freq: Once | INTRAMUSCULAR | Status: AC
  Administered 2017-10-11: 60 mg via INTRAMUSCULAR
  Filled 2017-10-11: qty 2

## 2017-10-11 MED ORDER — SENNOSIDES-DOCUSATE SODIUM 8.6-50 MG PO TABS: 1.0000 | ORAL_TABLET | Freq: Two times a day (BID) | ORAL | 0 refills | Status: AC

## 2017-10-11 MED ORDER — DEXAMETHASONE SODIUM PHOSPHATE 10 MG/ML IJ SOLN
10.0000 mg | Freq: Once | INTRAMUSCULAR | Status: AC
  Administered 2017-10-11: 10 mg via INTRAMUSCULAR
  Filled 2017-10-11: qty 1

## 2017-10-11 MED ORDER — OXYCODONE-ACETAMINOPHEN 5-325 MG PO TABS
2.0000 | ORAL_TABLET | Freq: Once | ORAL | Status: AC
  Administered 2017-10-11: 2 via ORAL
  Filled 2017-10-11: qty 2

## 2017-10-11 NOTE — ED Notes (Signed)
Feels a lot better

## 2017-10-11 NOTE — ED Notes (Signed)
Patient transported to X-ray 

## 2017-10-11 NOTE — ED Triage Notes (Signed)
Pt reports ongoing lower back pain into her buttock. [facility] for [facility] and was told to take ibuprofen but no relief. States she also had a fall on Friday and now has increase in pain. Pt is ambulatory on arrival.

## 2017-10-11 NOTE — Discharge Instructions (Signed)
You can continue taking the ibuprofen and muscle relaxers if they help.  I have also given you something stronger as needed.  I think that there is a large component of constipation.  I have given both enemas as well as a daily laxative and softener.  In addition to this, I recommend the following MiraLAX regimen.  I note this has not worked for you in the past, but in combination with the prescribed medicines, should be able to provide some relief.  Mix 8 capfuls of MiraLAX in one 32-64 oz Gatorade or water bottle.  Drink this over the course of a day.

## 2017-10-11 NOTE — ED Provider Notes (Signed)
[facility] EMERGENCY DEPARTMENT Provider Note   CSN: [id number] Arrival date & time: 10/11/17  1134     History   Chief Complaint Chief Complaint  Patient presents with  . Back Pain    HPI Maureen Lewis is a 21 y.o. female.  HPI   21 year old female with past medical history as below presents with lower back pain.  Patient states that for the last 2 weeks, she has had progressively worsening aching, throbbing, lower midline back pain.  Denies any preceding trauma.  She states that she was seen in urgent care approximately 1 week ago and was given Toradol and ibuprofen and this has not improved her pain.  She was also given a muscle relaxant which has not helped it.  She has had persistently worsening aching lower back pain since then.  Denies any numbness or weakness.  Denies any change in her bowel habits, though she does suffer from chronic constipation and has been diagnosed with colitis in the past.  She denies any change in her bowel habits other than slightly worsened constipation.  Denies any nausea or vomiting.  Denies any fevers or chills.  No weight loss or night sweats.  No recent falls or trauma.  No loss of bowel or bladder function.  Past Medical History:  Diagnosis Date  . Acid reflux   . Anemia    no current med.  . Complication of anesthesia    states had to keep giving her anesthesia during EGD  . Constipation   . Finger mass, left 03/2017   middle finger  . Irritable bowel syndrome (IBS)   . Morbid obesity with body mass index (BMI) of 45.0 to 49.9 in adult [facility])   . Plantar fasciitis, bilateral   . Polycystic ovary syndrome   . Shortness of breath   . Sleep apnea    no CPAP use    Patient Active Problem List   Diagnosis Date Noted  . Vitamin D deficiency 09/16/2017  . Prediabetes 09/16/2017  . Depression 09/16/2017  . Class 3 severe obesity with serious comorbidity and body mass index (BMI) of 50.0 to 59.9 in adult ([place]) 09/16/2017   . Other fatigue 08/13/2017  . Shortness of breath on exertion 08/13/2017  . Obstructive sleep apnea syndrome 08/13/2017  . PCOS (polycystic ovarian syndrome) 08/13/2017  . Pseudotumor cerebri syndrome 08/28/2016  . Super obesity 08/28/2016  . Obesity hypoventilation syndrome ([place]) 08/28/2016  . Benign paroxysmal positional vertigo due to bilateral vestibular disorder 08/28/2016  . OSA (obstructive sleep apnea) 08/28/2016  . Hypersomnia with sleep apnea 08/28/2016  . Female hirsutism 08/28/2016  . Wrist pain 05/30/2015  . Back pain 04/16/2015  . Amenorrhea 04/13/2015  . Allergic cough 03/28/2015  . Sexually active at young age 21/04/2015  . GERD (gastroesophageal reflux disease) 02/24/2015  . Constipation 10/02/2014  . Shoulder pain, left 01/29/2014  . Bilateral hand numbness 11/14/2013  . Elevated fasting glucose 05/20/2012  . [place] (well child check) 05/20/2012    Past Surgical History:  Procedure Laterality Date  . COLONOSCOPY WITH PROPOFOL  10/07/2016  . ESOPHAGOGASTRODUODENOSCOPY  12/21/2015  . EXCISION MASS UPPER EXTREMETIES Left 04/21/2017   Procedure: EXCISION MASS LEFT MIDDLE FINGER;  Surgeon: Daryll Brod, MD;  Location: [facility];  Service: Orthopedics;  Laterality: Left;    OB History    Gravida Para Term Preterm AB Living   0 0 0 0 0 0   SAB TAB Ectopic Multiple Live Births   0  0 0 0 0       Home Medications    Prior to Admission medications   Medication Sig Start Date End Date Taking? Authorizing Provider  bisacodyl (DULCOLAX) 10 MG suppository Place 1 suppository (10 mg total) rectally as needed for severe constipation. 10/11/17   Duffy Bruce, MD  buPROPion (WELLBUTRIN SR) 150 MG 12 hr tablet Take 1 tablet (150 mg total) by mouth daily. 09/16/17   Dennard Nip D, MD  cyclobenzaprine (FLEXERIL) 10 MG tablet Take 1 tablet (10 mg total) by mouth at bedtime. 10/01/17   Wieters, Hallie C, PA-C  diclofenac (VOLTAREN) 75 MG EC tablet Take 1  tablet (75 mg total) by mouth 2 (two) times daily as needed for moderate pain. 03/03/17   Katy Apo, NP  dicyclomine (BENTYL) 20 MG tablet Take 20 mg by mouth every 6 (six) hours as needed for spasms.     [provider]  ibuprofen (ADVIL,MOTRIN) 800 MG tablet Take 1 tablet (800 mg total) by mouth every 8 (eight) hours as needed for up to 14 days for moderate pain. 10/01/17 10/15/17  Wieters, Hallie C, PA-C  loratadine (CLARITIN) 10 MG tablet Take 10 mg by mouth daily as needed for allergies.     [provider]  metFORMIN (GLUCOPHAGE) 500 MG tablet Take 1 tablet (500 mg total) by mouth daily with breakfast. 09/16/17   Dennard Nip D, MD  omeprazole (PRILOSEC) 40 MG capsule TAKE ONE CAPSULE BY MOUTH  DAILY Patient taking differently: TAKE 40 MG BY MOUTH  DAILY 10/16/15   Katheren Shams, DO  ondansetron (ZOFRAN ODT) 8 MG disintegrating tablet Take 1 tablet (8 mg total) by mouth every 8 (eight) hours as needed for nausea or vomiting. 10/01/17   Wieters, Hallie C, PA-C  polyethylene glycol (MIRALAX / GLYCOLAX) packet Take 17 g by mouth daily as needed for moderate constipation.     [provider]  senna-docusate (SENOKOT-S) 8.6-50 MG tablet Take 1 tablet by mouth 2 (two) times daily for 5 days. 10/11/17 10/16/17  Duffy Bruce, MD  topiramate (TOPAMAX) 25 MG tablet Take 1 tablet (25 mg total) by mouth 2 times daily at 12 noon and 4 pm. 07/01/17   Dohmeier, Asencion Partridge, MD  traMADol (ULTRAM) 50 MG tablet Take 1 tablet (50 mg total) by mouth every 6 (six) hours as needed for severe pain. 10/11/17   Duffy Bruce, MD  Vitamin D, Ergocalciferol, (DRISDOL) 50000 units CAPS capsule Take 1 capsule (50,000 Units total) by mouth every 7 (seven) days. 09/16/17   Starlyn Skeans, MD    Family History Family History  Problem Relation Age of Onset  . Diabetes Maternal Aunt   . Hypertension Maternal Uncle   . Depression Maternal Grandfather   . Diabetes Maternal Grandfather     . Hypertension Maternal Grandfather   . Arthritis Mother   . High blood pressure Mother   . Depression Mother   . Sleep apnea Mother   . Obesity Mother     Social History Social History   Tobacco Use  . Smoking status: Never Smoker  . Smokeless tobacco: Never Used  Substance Use Topics  . Alcohol use: Yes    Comment: social   . Drug use: No     Allergies   Bee pollen; Hydrocodone-acetaminophen; Peach flavor; and Pollen extract   Review of Systems Review of Systems  Constitutional: Negative for chills and fever.  HENT: Negative for congestion, rhinorrhea and sore throat.   Eyes: Negative for  visual disturbance.  Respiratory: Negative for cough, shortness of breath and wheezing.   Cardiovascular: Negative for chest pain and leg swelling.  Gastrointestinal: Negative for abdominal pain, diarrhea, nausea and vomiting.  Genitourinary: Negative for dysuria, flank pain, vaginal bleeding and vaginal discharge.  Musculoskeletal: Positive for back pain and gait problem (Due to back pain). Negative for neck pain.  Skin: Negative for rash.  Allergic/Immunologic: Negative for immunocompromised state.  Neurological: Negative for syncope and headaches.  Hematological: Does not bruise/bleed easily.  All other systems reviewed and are negative.    Physical Exam Updated Vital Signs BP (!) 109/42 (BP Location: Right Arm)   Pulse 71   Temp 98.3 F (36.8 C) (Oral)   Resp 19   SpO2 99%   Physical Exam  Constitutional: She is oriented to person, place, and time. She appears well-developed and well-nourished. No distress.  HENT:  Head: Normocephalic and atraumatic.  Eyes: Conjunctivae are normal.  Neck: Neck supple.  Cardiovascular: Normal rate, regular rhythm and normal heart sounds. Exam reveals no friction rub.  No murmur heard. Pulmonary/Chest: Effort normal and breath sounds normal. No respiratory distress. She has no wheezes. She has no rales.  Abdominal: She exhibits no  distension.  Exam somewhat limited due to habitus but no overt tenderness, rebound, or guarding.  No palpable masses or pulsatile masses.  Musculoskeletal: She exhibits no edema.  Neurological: She is alert and oriented to person, place, and time. She exhibits normal muscle tone.  Skin: Skin is warm. Capillary refill takes less than 2 seconds.  Psychiatric: She has a normal mood and affect.  Nursing note and vitals reviewed.   Spine Exam: Inspection/Palpation: Exquisite tenderness over midline and paraspinal lower lumbar spine.  No midline step-offs or deformity. Strength: 5/5 throughout LE bilaterally (hip flexion/extension, adduction/abduction; knee flexion/extension; foot dorsiflexion/plantarflexion, inversion/eversion; great toe inversion) Sensation: Intact to light touch in proximal and distal LE bilaterally Reflexes: 2+ quadriceps and achilles reflexes  ED Treatments / Results  Labs (all labs ordered are listed, but only abnormal results are displayed) Labs Reviewed  URINALYSIS, ROUTINE W REFLEX MICROSCOPIC - Abnormal; Notable for the following components:      Result Value   APPearance HAZY (*)    All other components within normal limits  POC URINE PREG, ED    EKG  EKG Interpretation None       Radiology Dg Lumbar Spine Complete  Result Date: 10/11/2017 CLINICAL DATA:  Low back pain for 3 weeks.  No known injury. EXAM: LUMBAR SPINE - COMPLETE 4+ VIEW COMPARISON:  Plain films lumbar spine 06/08/2015. FINDINGS: There is no evidence of lumbar spine fracture. Alignment is normal. Intervertebral disc spaces are maintained. IMPRESSION: Normal exam. Electronically Signed   By: Inge Rise M.D.   On: 10/11/2017 13:53   Dg Abd 2 Views  Result Date: 10/11/2017 CLINICAL DATA:  Low back pain for 3 weeks radiating into the abdomen. No known injury. EXAM: ABDOMEN - 2 VIEW COMPARISON:  None. FINDINGS: The bowel gas pattern is normal. There is no evidence of free air. Moderate  stool burden noted. No radio-opaque calculi or other significant radiographic abnormality is seen. IMPRESSION: No acute abnormality.  Moderate colonic stool burden. Electronically Signed   By: Inge Rise M.D.   On: 10/11/2017 13:53    Procedures Procedures (including critical care time)  Medications Ordered in ED Medications  dexamethasone (DECADRON) injection 10 mg (10 mg Intramuscular Given 10/11/17 1245)  ketorolac (TORADOL) injection 60 mg (60 mg Intramuscular Given 10/11/17  1245)  oxyCODONE-acetaminophen (PERCOCET/ROXICET) 5-325 MG per tablet 2 tablet (2 tablets Oral Given 10/11/17 1245)     Initial Impression / Assessment and Plan / ED Course  I have reviewed the triage vital signs and the nursing notes.  Pertinent labs & imaging results that were available during my care of the patient were reviewed by me and considered in my medical decision making (see chart for details).     21 year old female here with atraumatic lower back pain.  The history and exam is as above.  No red flags with no lower extremity weakness, numbness, loss of bowel or bladder function, or signs of cauda equina or acute cord compression or radiculopathy.  She does have a history of chronic constipation and plain films show significant stool burden.  I suspect this is the primary cause of versus contributing significantly to her back pain and she endorses that her back pain gets worse when she tries to have a bowel movement.  She has no significant abdominal tenderness to suggest diverticulitis, appendicitis, or other acute intra-abdominal pathology and I do not feel further imaging is indicated.  Urinalysis is without signs of UTI.  UPT negative.  Denies any vaginal bleeding, discharge, or symptoms to suggest PID or uterine pathology.  Will treat her with a bowel regimen, supportive care for her back pain, and discharged home.  This note was prepared with assistance of Systems analyst.  Occasional wrong-word or sound-a-like substitutions may have occurred due to the inherent limitations of voice recognition software.   Final Clinical Impressions(s) / ED Diagnoses   Final diagnoses:  Constipation  Acute midline low back pain without sciatica    ED Discharge Orders        Ordered    traMADol (ULTRAM) 50 MG tablet  Every 6 hours PRN     10/11/17 1429    bisacodyl (DULCOLAX) 10 MG suppository  As needed     10/11/17 1429    senna-docusate (SENOKOT-S) 8.6-50 MG tablet  2 times daily     10/11/17 1429       Duffy Bruce, MD 10/11/17 1440

## 2017-11-09 ENCOUNTER — Ambulatory Visit (INDEPENDENT_AMBULATORY_CARE_PROVIDER_SITE_OTHER): Payer: BLUE CROSS/BLUE SHIELD | Admitting: Family Medicine

## 2017-11-09 VITALS — BP 113/62 | HR 72 | Ht 73.0 in | Wt >= 6400 oz

## 2017-11-09 DIAGNOSIS — F3289 Other specified depressive episodes: Secondary | ICD-10-CM

## 2017-11-09 DIAGNOSIS — E559 Vitamin D deficiency, unspecified: Secondary | ICD-10-CM | POA: Diagnosis not present

## 2017-11-09 DIAGNOSIS — Z6841 Body Mass Index (BMI) 40.0 and over, adult: Secondary | ICD-10-CM | POA: Diagnosis not present

## 2017-11-09 MED ORDER — BUPROPION HCL ER (SR) 150 MG PO TB12: 150.0000 mg | ORAL_TABLET | Freq: Every day | ORAL | 0 refills | Status: DC

## 2017-11-09 MED ORDER — VITAMIN D (ERGOCALCIFEROL) 1.25 MG (50000 UNIT) PO CAPS: 50000.0000 [IU] | ORAL_CAPSULE | ORAL | 0 refills | Status: DC

## 2017-11-09 NOTE — Progress Notes (Signed)
Office: [phone]  /  Fax: [phone]   HPI:   Chief Complaint: OBESITY Maureen Lewis is here to discuss her progress with her obesity treatment plan. She is on the Category 3 plan +300 calories and is following her eating plan approximately 70 to 75 % of the time. She states she is exercising 0 minutes 0 times per week. Maureen Lewis last visit was 09/16/17. She did well over the holidays and was mindful of her portions, but has been fighting a cold over the holiday, so she was only eating soup and drinking lots of water.Cravings are well controlled and sweet intake is significantly decreased. Her weight is (!) 402 lb (182.3 kg) today and has maintained weight over a period of 2 weeks since her last visit. She has lost 2 lbs since starting treatment with [name].  Vitamin D deficiency Maureen Lewis has a diagnosis of vitamin D deficiency. She is currently taking vit D and denies nausea, vomiting or muscle weakness.   Ref. Range 08/13/2017 09:59  Vitamin D, 25-Hydroxy Latest Ref Range: 30.0 - 100.0 ng/mL 12.5 (L)   Depression with emotional eating behaviors Maureen Lewis is struggling with emotional eating and using food for comfort to the extent that it is negatively impacting her health. She often snacks when she is not hungry. Maureen Lewis sometimes feels she is out of control and then feels guilty that she made poor food choices. She has been working on behavior modification techniques to help reduce her emotional eating and has been somewhat successful. Her cravings are better controlled on Wellbutrin. She shows no sign of suicidal or homicidal ideations.  Depression screen [facility] 2/9 08/13/2017 05/30/2015 05/01/2015  Decreased Interest 3 0 0  Down, Depressed, Hopeless 2 0 0  PHQ - 2 Score 5 0 0  Altered sleeping 3 - -  Tired, decreased energy 3 - -  Change in appetite 2 - -  Feeling bad or failure about yourself  1 - -  Trouble concentrating 1 - -  Moving slowly or fidgety/restless 2 - -  Suicidal thoughts 0 - -  PHQ-9  Score 17 - -  Difficult doing work/chores Extremely dIfficult - -      ALLERGIES: Allergies  Allergen Reactions  . Bee Pollen Swelling, Shortness Of Breath and Hives  . Hydrocodone-Acetaminophen Anaphylaxis  . Peach Flavor Hives, Shortness Of Breath and Other (See Comments)    SWELLING OF MOUTH  . Pollen Extract Hives, Shortness Of Breath and Swelling    MEDICATIONS: Current Outpatient Medications on File Prior to Visit  Medication Sig Dispense Refill  . bisacodyl (DULCOLAX) 10 MG suppository Place 1 suppository (10 mg total) rectally as needed for severe constipation. 12 suppository 0  . buPROPion (WELLBUTRIN SR) 150 MG 12 hr tablet Take 1 tablet (150 mg total) by mouth daily. 30 tablet 0  . cyclobenzaprine (FLEXERIL) 10 MG tablet Take 1 tablet (10 mg total) by mouth at bedtime. 10 tablet 0  . diclofenac (VOLTAREN) 75 MG EC tablet Take 1 tablet (75 mg total) by mouth 2 (two) times daily as needed for moderate pain. 20 tablet 0  . dicyclomine (BENTYL) 20 MG tablet Take 20 mg by mouth every 6 (six) hours as needed for spasms.     Marland Kitchen loratadine (CLARITIN) 10 MG tablet Take 10 mg by mouth daily as needed for allergies.     . metFORMIN (GLUCOPHAGE) 500 MG tablet Take 1 tablet (500 mg total) by mouth daily with breakfast. 30 tablet 0  . omeprazole (PRILOSEC) 40 MG  capsule TAKE ONE CAPSULE BY MOUTH  DAILY (Patient taking differently: TAKE 40 MG BY MOUTH  DAILY) 30 capsule 3  . ondansetron (ZOFRAN ODT) 8 MG disintegrating tablet Take 1 tablet (8 mg total) by mouth every 8 (eight) hours as needed for nausea or vomiting. 30 tablet 0  . polyethylene glycol (MIRALAX / GLYCOLAX) packet Take 17 g by mouth daily as needed for moderate constipation.     . topiramate (TOPAMAX) 25 MG tablet Take 1 tablet (25 mg total) by mouth 2 times daily at 12 noon and 4 pm. 60 tablet 11  . traMADol (ULTRAM) 50 MG tablet Take 1 tablet (50 mg total) by mouth every 6 (six) hours as needed for severe pain. 15 tablet 0    . Vitamin D, Ergocalciferol, (DRISDOL) 50000 units CAPS capsule Take 1 capsule (50,000 Units total) by mouth every 7 (seven) days. 4 capsule 0   No current facility-administered medications on file prior to visit.     PAST MEDICAL HISTORY: Past Medical History:  Diagnosis Date  . Acid reflux   . Anemia    no current med.  . Complication of anesthesia    states had to keep giving her anesthesia during EGD  . Constipation   . Finger mass, left 03/2017   middle finger  . Irritable bowel syndrome (IBS)   . Morbid obesity with body mass index (BMI) of 45.0 to 49.9 in adult [facility])   . Plantar fasciitis, bilateral   . Polycystic ovary syndrome   . Shortness of breath   . Sleep apnea    no CPAP use    PAST SURGICAL HISTORY: Past Surgical History:  Procedure Laterality Date  . COLONOSCOPY WITH PROPOFOL  10/07/2016  . ESOPHAGOGASTRODUODENOSCOPY  12/21/2015  . EXCISION MASS UPPER EXTREMETIES Left 04/21/2017   Procedure: EXCISION MASS LEFT MIDDLE FINGER;  Surgeon: Daryll Brod, MD;  Location: [place];  Service: Orthopedics;  Laterality: Left;    SOCIAL HISTORY: Social History   Tobacco Use  . Smoking status: Never Smoker  . Smokeless tobacco: Never Used  Substance Use Topics  . Alcohol use: Yes    Comment: social   . Drug use: No    FAMILY HISTORY: Family History  Problem Relation Age of Onset  . Diabetes Maternal Aunt   . Hypertension Maternal Uncle   . Depression Maternal Grandfather   . Diabetes Maternal Grandfather   . Hypertension Maternal Grandfather   . Arthritis Mother   . High blood pressure Mother   . Depression Mother   . Sleep apnea Mother   . Obesity Mother     ROS: Review of Systems  Constitutional: Positive for malaise/fatigue. Negative for weight loss.  Gastrointestinal: Negative for nausea and vomiting.  Musculoskeletal:       Negative muscle weakness  Psychiatric/Behavioral: Positive for depression. Negative for suicidal ideas.     PHYSICAL EXAM: Blood pressure 113/62, pulse 72, height 6\' 1"  (1.854 m), weight (!) 402 lb (182.3 kg), last menstrual period 10/08/2017, SpO2 98 %. Body mass index is 53.04 kg/m. Physical Exam  Constitutional: She is oriented to person, place, and time. She appears well-developed and well-nourished.  Cardiovascular: Normal rate.  Pulmonary/Chest: Effort normal.  Musculoskeletal: Normal range of motion.  Neurological: She is oriented to person, place, and time.  Skin: Skin is warm and dry.  Psychiatric: She has a normal mood and affect. Her behavior is normal.  Vitals reviewed.   RECENT LABS AND TESTS: BMET    Component  Value Date/Time   NA 141 08/20/2017 1459   NA 139 08/13/2017 0959   K 4.0 08/20/2017 1459   CL 105 08/20/2017 1459   CO2 24 08/13/2017 0959   GLUCOSE 104 (H) 08/20/2017 1459   BUN 5 (L) 08/20/2017 1459   BUN 9 08/13/2017 0959   CREATININE 0.60 08/20/2017 1459   CREATININE 0.53 02/23/2015 1002   CALCIUM 9.4 08/13/2017 0959   GFRNONAA 130 08/13/2017 0959   GFRAA 150 08/13/2017 0959   Lab Results  Component Value Date   HGBA1C 5.8 (H) 08/13/2017   HGBA1C 5.9 03/08/2014   HGBA1C 5.8 05/20/2012   Lab Results  Component Value Date   INSULIN 42.1 (H) 08/13/2017   CBC    Component Value Date/Time   WBC 5.9 08/13/2017 0959   WBC 6.7 05/28/2017 1011   RBC 5.48 (H) 08/13/2017 0959   RBC 5.29 05/28/2017 1011   HGB 12.6 08/20/2017 1459   HGB 12.0 08/13/2017 0959   HCT 37.0 08/20/2017 1459   HCT 38.3 08/13/2017 0959   PLT 262.0 05/28/2017 1011   MCV 70 (L) 08/13/2017 0959   MCH 21.9 (L) 08/13/2017 0959   MCH 21.2 (L) 07/26/2015 1315   MCHC 31.3 (L) 08/13/2017 0959   MCHC 30.8 (L) 05/28/2017 1011   RDW 15.9 (H) 08/13/2017 0959   LYMPHSABS 1.5 08/13/2017 0959   MONOABS 0.5 05/28/2017 1011   EOSABS 0.0 08/13/2017 0959   BASOSABS 0.0 08/13/2017 0959   Iron/TIBC/Ferritin/ %Sat No results found for: IRON, TIBC, FERRITIN, IRONPCTSAT Lipid Panel      Component Value Date/Time   CHOL 127 08/13/2017 0959   TRIG 54 08/13/2017 0959   HDL 39 (L) 08/13/2017 0959   CHOLHDL 3.4 02/23/2015 1002   VLDL 10 02/23/2015 1002   LDLCALC 77 08/13/2017 0959   Hepatic Function Panel     Component Value Date/Time   PROT 7.3 08/13/2017 0959   ALBUMIN 4.2 08/13/2017 0959   AST 14 08/13/2017 0959   ALT 16 08/13/2017 0959   ALKPHOS 98 08/13/2017 0959   BILITOT 0.6 08/13/2017 0959   BILIDIR 0.1 05/28/2017 1011      Component Value Date/Time   TSH 1.010 08/13/2017 0959   TSH 1.29 05/28/2017 1011   TSH 1.21 07/23/2016   TSH 1.913 04/13/2015 0938     Ref. Range 08/13/2017 09:59  Vitamin D, 25-Hydroxy Latest Ref Range: 30.0 - 100.0 ng/mL 12.5 (L)    ASSESSMENT AND PLAN: Vitamin D deficiency - Plan: Vitamin D, Ergocalciferol, (DRISDOL) 50000 units CAPS capsule  Other depression - with emotional eating - Plan: buPROPion (WELLBUTRIN SR) 150 MG 12 hr tablet  Class 3 severe obesity with serious comorbidity and body mass index (BMI) of 50.0 to 59.9 in adult, unspecified obesity type (H[facility])  PLAN:  Vitamin D Deficiency Maureen Lewis was informed that low vitamin D levels contributes to fatigue and are associated with obesity, breast, and colon cancer. She agrees to continue to take prescription Vit D @50 ,000 IU every week #4 with no refills and will follow up for routine testing of vitamin D, at least 2-3 times per year. She was informed of the risk of over-replacement of vitamin D and agrees to not increase her dose unless she discusses this with [name] first. Maureen Lewis agrees to follow up with our clinic in 2 weeks.  Depression with Emotional Eating Behaviors We discussed behavior modification techniques today to help Maureen Lewis deal with her emotional eating and depression. She has agreed to continue Wellbutrin SR 150 mg  qd #30 with no refills and follow up as directed.  Obesity Maureen Lewis is currently in the action stage of change. As such, her goal is to continue with  weight loss efforts She has agreed to follow the Category 3 plan +300 calories Maureen Lewis has been instructed to work up to a goal of 150 minutes of combined cardio and strengthening exercise per week for weight loss and overall health benefits. We discussed the following Behavioral Modification Strategies today: increasing lean protein intake and increase H2O intake.  Maureen Lewis has agreed to follow up with our clinic in 2 weeks. She was informed of the importance of frequent follow up visits to maximize her success with intensive lifestyle modifications for her multiple health conditions.   OBESITY BEHAVIORAL INTERVENTION VISIT  Today's visit was # 4 out of 22.  Starting weight: 404 lbs Starting date: 08/13/17 Today's weight : 402 lbs Today's date: 11/09/2017 Total lbs lost to date: 2 (Patients must lose 7 lbs in the first 6 months to continue with counseling)   ASK: We discussed the diagnosis of obesity with Maureen Lewis today and Maureen Lewis agreed to give [name] permission to discuss obesity behavioral modification therapy today.  ASSESS: Maureen Lewis has the diagnosis of obesity and her BMI today is 53.05 Maureen Lewis is in the action stage of change   ADVISE: Maureen Lewis was educated on the multiple health risks of obesity as well as the benefit of weight loss to improve her health. She was advised of the need for long term treatment and the importance of lifestyle modifications.  AGREE: Multiple dietary modification options and treatment options were discussed and  Maureen Lewis agreed to the above obesity treatment plan.  I, Doreene Nest, am acting as transcriptionist for Dennard Nip, MD I have reviewed the above documentation for accuracy and completeness, and I agree with the above. -Dennard Nip, MD

## 2017-11-17 ENCOUNTER — Ambulatory Visit (HOSPITAL_COMMUNITY)
Admission: EM | Admit: 2017-11-17 | Discharge: 2017-11-17 | Disposition: A | Discharge status: Home / Self Care | Payer: BLUE CROSS/BLUE SHIELD | Attending: Family Medicine | Admitting: Family Medicine

## 2017-11-17 ENCOUNTER — Encounter (HOSPITAL_COMMUNITY): Payer: Self-pay | Admitting: Emergency Medicine

## 2017-11-17 DIAGNOSIS — B9789 Other viral agents as the cause of diseases classified elsewhere: Secondary | ICD-10-CM

## 2017-11-17 DIAGNOSIS — J069 Acute upper respiratory infection, unspecified: Secondary | ICD-10-CM

## 2017-11-17 MED ORDER — ONDANSETRON 8 MG PO TBDP: 8.0000 mg | ORAL_TABLET | Freq: Three times a day (TID) | ORAL | 0 refills | Status: DC | PRN

## 2017-11-17 MED ORDER — CROMOLYN SODIUM 5.2 MG/ACT NA AERS: 1.0000 | INHALATION_SPRAY | Freq: Four times a day (QID) | NASAL | 12 refills | Status: DC

## 2017-11-17 NOTE — ED Triage Notes (Signed)
PT reports headaches and dizziness lately.

## 2017-11-17 NOTE — ED Triage Notes (Signed)
PT reports cough and congestion that started Friday. Some fevers over the weekend

## 2017-11-17 NOTE — Discharge Instructions (Signed)
Push fluids to ensure adequate hydration and keep secretions thin.  Tylenol and/or ibuprofen as needed for pain or fevers.  May use nasal spray 4 times a day for congestion symptoms.  Zofran as needed for nausea.  Continue with over the counter medications as needed for symptoms. Please recheck with your primary care doctor in the next week for BP recheck and symptom recheck. If develop increased fevers, shortness of breath, abdominal pain, dehydration please return to be seen or go to er.

## 2017-11-17 NOTE — ED Triage Notes (Signed)
PT Hypertensive

## 2017-11-17 NOTE — ED Provider Notes (Signed)
[place]    CSN: [id number] Arrival date & time: 11/17/17  1738     History   Chief Complaint Chief Complaint  Patient presents with  . URI    HPI Maureen Lewis is a 22 y.o. female.   Maureen Lewis presents with complaints of scratchy throat which started 1/25. This developed into productive cough as well as ear pain and runny nose. Subjective fevers last night. Took dayquil and ibuprofen this morning, has not taken anything since. They did seem to help. Nausea, diarrhea. Vomited x1 today. without abdominal pain. States she tends to have nausea and takes zofran quite regularly for this. Without history of known asthma, does not smoke. Does not know if she got her flu vaccine this season. Has had ill coworkers. History of reflux, anemia, obesity, prediabetes. Without history of hypertension, states she has had elevated BP with checks at work for the past few weeks.   ROS per HPI.       Past Medical History:  Diagnosis Date  . Acid reflux   . Anemia    no current med.  . Complication of anesthesia    states had to keep giving her anesthesia during EGD  . Constipation   . Finger mass, left 03/2017   middle finger  . Irritable bowel syndrome (IBS)   . Morbid obesity with body mass index (BMI) of 45.0 to 49.9 in adult [facility])   . Plantar fasciitis, bilateral   . Polycystic ovary syndrome   . Shortness of breath   . Sleep apnea    no CPAP use    Patient Active Problem List   Diagnosis Date Noted  . Vitamin D deficiency 09/16/2017  . Prediabetes 09/16/2017  . Depression 09/16/2017  . Class 3 severe obesity with serious comorbidity and body mass index (BMI) of 50.0 to 59.9 in adult ([place]) 09/16/2017  . Other fatigue 08/13/2017  . Shortness of breath on exertion 08/13/2017  . Obstructive sleep apnea syndrome 08/13/2017  . PCOS (polycystic ovarian syndrome) 08/13/2017  . Pseudotumor cerebri syndrome 08/28/2016  . Super obesity 08/28/2016  . Obesity hypoventilation  syndrome ([place]) 08/28/2016  . Benign paroxysmal positional vertigo due to bilateral vestibular disorder 08/28/2016  . OSA (obstructive sleep apnea) 08/28/2016  . Hypersomnia with sleep apnea 08/28/2016  . Female hirsutism 08/28/2016  . Wrist pain 05/30/2015  . Back pain 04/16/2015  . Amenorrhea 04/13/2015  . Allergic cough 03/28/2015  . Sexually active at young age 27/04/2015  . GERD (gastroesophageal reflux disease) 02/24/2015  . Constipation 10/02/2014  . Shoulder pain, left 01/29/2014  . Bilateral hand numbness 11/14/2013  . Elevated fasting glucose 05/20/2012  . [place] (well child check) 05/20/2012    Past Surgical History:  Procedure Laterality Date  . COLONOSCOPY WITH PROPOFOL  10/07/2016  . ESOPHAGOGASTRODUODENOSCOPY  12/21/2015  . EXCISION MASS UPPER EXTREMETIES Left 04/21/2017   Procedure: EXCISION MASS LEFT MIDDLE FINGER;  Surgeon: Daryll Brod, MD;  Location: [place];  Service: Orthopedics;  Laterality: Left;    OB History    Gravida Para Term Preterm AB Living   0 0 0 0 0 0   SAB TAB Ectopic Multiple Live Births   0 0 0 0 0       Home Medications    Prior to Admission medications   Medication Sig Start Date End Date Taking? Authorizing Provider  buPROPion (WELLBUTRIN SR) 150 MG 12 hr tablet Take 1 tablet (150 mg total) by mouth daily. 11/09/17  Yes Leafy Ro,  Caren D, MD  diclofenac (VOLTAREN) 75 MG EC tablet Take 1 tablet (75 mg total) by mouth 2 (two) times daily as needed for moderate pain. 03/03/17  Yes Amyot, Nicholes Stairs, NP  loratadine (CLARITIN) 10 MG tablet Take 10 mg by mouth daily as needed for allergies.    Yes [provider]  metFORMIN (GLUCOPHAGE) 500 MG tablet Take 1 tablet (500 mg total) by mouth daily with breakfast. 09/16/17  Yes Leafy Ro, Caren D, MD  omeprazole (PRILOSEC) 40 MG capsule TAKE ONE CAPSULE BY MOUTH  DAILY Patient taking differently: TAKE 40 MG BY MOUTH  DAILY 10/16/15  Yes Luiz Blare Y, DO  topiramate (TOPAMAX)  25 MG tablet Take 1 tablet (25 mg total) by mouth 2 times daily at 12 noon and 4 pm. 07/01/17  Yes Dohmeier, Asencion Partridge, MD  cromolyn (NASALCROM) 5.2 MG/ACT nasal spray Place 1 spray into both nostrils 4 (four) times daily. 11/17/17   Zigmund Gottron, NP  dicyclomine (BENTYL) 20 MG tablet Take 20 mg by mouth every 6 (six) hours as needed for spasms.     [provider]  ondansetron (ZOFRAN ODT) 8 MG disintegrating tablet Take 1 tablet (8 mg total) by mouth every 8 (eight) hours as needed for nausea or vomiting. 11/17/17   Augusto Gamble B, NP  polyethylene glycol (MIRALAX / GLYCOLAX) packet Take 17 g by mouth daily as needed for moderate constipation.     [provider]  Vitamin D, Ergocalciferol, (DRISDOL) 50000 units CAPS capsule Take 1 capsule (50,000 Units total) by mouth every 7 (seven) days. 11/09/17   Starlyn Skeans, MD    Family History Family History  Problem Relation Age of Onset  . Diabetes Maternal Aunt   . Hypertension Maternal Uncle   . Depression Maternal Grandfather   . Diabetes Maternal Grandfather   . Hypertension Maternal Grandfather   . Arthritis Mother   . High blood pressure Mother   . Depression Mother   . Sleep apnea Mother   . Obesity Mother     Social History Social History   Tobacco Use  . Smoking status: Never Smoker  . Smokeless tobacco: Never Used  Substance Use Topics  . Alcohol use: Yes    Comment: social   . Drug use: No     Allergies   Bee pollen; Hydrocodone-acetaminophen; Peach flavor; and Pollen extract   Review of Systems Review of Systems   Physical Exam Triage Vital Signs ED Triage Vitals [11/17/17 1805]  Enc Vitals Group     BP (!) 146/105     Pulse Rate 99     Resp 16     Temp 98.7 F (37.1 C)     Temp Source Oral     SpO2 100 %     Weight      Height      Head Circumference      Peak Flow      Pain Score 5     Pain Loc      Pain Edu?      Excl. in [place]?    No data found.  Updated Vital Signs BP  (!) 146/105   Pulse 99   Temp 98.7 F (37.1 C) (Oral)   Resp 16   SpO2 100%   Visual Acuity Right Eye Distance:   Left Eye Distance:   Bilateral Distance:    Right Eye Near:   Left Eye Near:    Bilateral Near:     Physical Exam  Constitutional: She is oriented to person, place, and time. She appears well-developed and well-nourished. No distress.  HENT:  Head: Normocephalic and atraumatic.  Right Ear: Tympanic membrane, external ear and ear canal normal.  Left Ear: Tympanic membrane, external ear and ear canal normal.  Nose: Nose normal.  Mouth/Throat: Uvula is midline, oropharynx is clear and moist and mucous membranes are normal. No tonsillar exudate.  Eyes: Conjunctivae and EOM are normal. Pupils are equal, round, and reactive to light.  Cardiovascular: Normal rate, regular rhythm and normal heart sounds.  Pulmonary/Chest: Effort normal and breath sounds normal.  Abdominal: Soft. There is no tenderness.  Neurological: She is alert and oriented to person, place, and time.  Skin: Skin is warm and dry.     UC Treatments / Results  Labs (all labs ordered are listed, but only abnormal results are displayed) Labs Reviewed - No data to display  EKG  EKG Interpretation None       Radiology No results found.  Procedures Procedures (including critical care time)  Medications Ordered in UC Medications - No data to display   Initial Impression / Assessment and Plan / UC Course  I have reviewed the triage vital signs and the nursing notes.  Pertinent labs & imaging results that were available during my care of the patient were reviewed by me and considered in my medical decision making (see chart for details).     Non toxic in appearance. Afebrile. History and physical consistent with viral illness.  Supportive cares recommended. zofran as needed. Cromolyn may help with congestion symptoms. Recommend close follow up with PCP for bp recheck and recheck of symptoms.  Patient verbalized understanding and agreeable to plan.    Final Clinical Impressions(s) / UC Diagnoses   Final diagnoses:  Viral URI with cough    ED Discharge Orders        Ordered    ondansetron (ZOFRAN ODT) 8 MG disintegrating tablet  Every 8 hours PRN     11/17/17 1834    cromolyn (NASALCROM) 5.2 MG/ACT nasal spray  4 times daily     11/17/17 1834       Controlled Substance Prescriptions [place] Controlled Substance Registry consulted? Not Applicable   Zigmund Gottron, NP 11/17/17 1849

## 2017-11-25 ENCOUNTER — Ambulatory Visit (INDEPENDENT_AMBULATORY_CARE_PROVIDER_SITE_OTHER): Payer: BLUE CROSS/BLUE SHIELD | Admitting: Family Medicine

## 2017-11-25 ENCOUNTER — Ambulatory Visit (INDEPENDENT_AMBULATORY_CARE_PROVIDER_SITE_OTHER): Payer: BLUE CROSS/BLUE SHIELD | Admitting: Adult Health

## 2017-11-25 ENCOUNTER — Encounter: Payer: Self-pay | Admitting: Adult Health

## 2017-11-25 ENCOUNTER — Telehealth: Payer: Self-pay | Admitting: Neurology

## 2017-11-25 VITALS — BP 140/80 | Temp 98.2°F | Wt >= 6400 oz

## 2017-11-25 DIAGNOSIS — R030 Elevated blood-pressure reading, without diagnosis of hypertension: Secondary | ICD-10-CM

## 2017-11-25 DIAGNOSIS — J0100 Acute maxillary sinusitis, unspecified: Secondary | ICD-10-CM | POA: Diagnosis not present

## 2017-11-25 MED ORDER — DOXYCYCLINE HYCLATE 100 MG PO CAPS: 100.0000 mg | ORAL_CAPSULE | Freq: Two times a day (BID) | ORAL | 0 refills | Status: DC

## 2017-11-25 NOTE — Telephone Encounter (Signed)
Pt called she passed out at work yesterday and BP was high. She did go home and went to bed. She had appt with PCP today and she was advised her to see neurologist. Please call to advise on an appt. She has appt with Hoyle Sauer for cpap on 2/25.

## 2017-11-25 NOTE — Telephone Encounter (Signed)
Called the pt back and talked to her she states that her pcp was wanting her to get seen sooner because of the pass out spell, headaches and high BP. In looking at her CPAP data I have noticed that the pt has only used the machine 7/30 days. I informed her that using the CPAP would actually help with her BP and her headaches. I went over the clinical reasoning behind this. The patient was scheduled for a apt on 2/25. I have actually requested that we push this apt out further to give her an opportunity to use the CPAP more and meet the minimum of the 70% compliance. The patient states the reason she has not been using it is because she has had a head cold the last 3 weeks and it is really difficult for her to use the CPAP with all that has been going on. The patient was agreeable to actually pushing the apt out further to give the CPAP more opportunity to work. I also instructed the pt to be mindful to keep an eye out and see if while using the CPAP if she notices a change for the better in her BP and also if her headaches lessen. Pt verbalized understanding. Pt had no questions at this time but was encouraged to call back if questions arise. Pt will see Dr Brett Fairy on January 12, 2018 at 8:30 am

## 2017-11-25 NOTE — Progress Notes (Signed)
Subjective:    Patient ID: Maureen Lewis, female    DOB: 12-Dec-1995, 22 y.o.   MRN: [id number]  HPI  22 year old female who  has a past medical history of Acid reflux, Anemia, Complication of anesthesia, Constipation, Finger mass, left (03/2017), Irritable bowel syndrome (IBS), Morbid obesity with body mass index (BMI) of 45.0 to 49.9 in adult [facility]), Plantar fasciitis, bilateral, Polycystic ovary syndrome, Shortness of breath, and Sleep apnea.  She presents to the office today for concern of elevated blood pressure readings.   She was seen at [facility] on 11/17/2016 for URI symptoms, her BP was 146/105, but this elevation could be due to Nyquil/Dayquil use   Previous to that she was seen by Dr. Shary Decamp at the weight loss clinic and her BP was 113/62.   On 10/11/2017  While in the ER on 10/11/2017 her BP was 109/42  She continues to suffer from sinusitis like symptoms, her symptoms include fever up to 101, rhinorrhea, sinus pain and pressure and headaches. She is no longer taking OTC medications for her symptoms. Symptoms have been present for 2 weeks   Review of Systems See HPI   Past Medical History:  Diagnosis Date  . Acid reflux   . Anemia    no current med.  . Complication of anesthesia    states had to keep giving her anesthesia during EGD  . Constipation   . Finger mass, left 03/2017   middle finger  . Irritable bowel syndrome (IBS)   . Morbid obesity with body mass index (BMI) of 45.0 to 49.9 in adult [facility])   . Plantar fasciitis, bilateral   . Polycystic ovary syndrome   . Shortness of breath   . Sleep apnea    no CPAP use    Social History   Socioeconomic History  . Marital status: Single    Spouse name: Not on file  . Number of children: Not on file  . Years of education: Not on file  . Highest education level: Not on file  Social Needs  . Financial resource strain: Not on file  . Food insecurity - worry: Not on file  . Food insecurity - inability: Not on file  .  Transportation needs - medical: Not on file  . Transportation needs - non-medical: Not on file  Occupational History  . Occupation: Chemical engineer: [id number]  Tobacco Use  . Smoking status: Never Smoker  . Smokeless tobacco: Never Used  Substance and Sexual Activity  . Alcohol use: Yes    Comment: social   . Drug use: No  . Sexual activity: No  Other Topics Concern  . Not on file  Social History Narrative   Lives with mom and cousin, a grandmother in [place] also helps care for her.       She is in nursing school.     Past Surgical History:  Procedure Laterality Date  . COLONOSCOPY WITH PROPOFOL  10/07/2016  . ESOPHAGOGASTRODUODENOSCOPY  12/21/2015  . EXCISION MASS UPPER EXTREMETIES Left 04/21/2017   Procedure: EXCISION MASS LEFT MIDDLE FINGER;  Surgeon: Daryll Brod, MD;  Location: [place];  Service: Orthopedics;  Laterality: Left;    Family History  Problem Relation Age of Onset  . Diabetes Maternal Aunt   . Hypertension Maternal Uncle   . Depression Maternal Grandfather   . Diabetes Maternal Grandfather   . Hypertension Maternal Grandfather   . Arthritis Mother   . High blood pressure Mother   .  Depression Mother   . Sleep apnea Mother   . Obesity Mother     Allergies  Allergen Reactions  . Bee Pollen Swelling, Shortness Of Breath and Hives  . Hydrocodone-Acetaminophen Anaphylaxis  . Peach Flavor Hives, Shortness Of Breath and Other (See Comments)    SWELLING OF MOUTH  . Pollen Extract Hives, Shortness Of Breath and Swelling    Current Outpatient Medications on File Prior to Visit  Medication Sig Dispense Refill  . buPROPion (WELLBUTRIN SR) 150 MG 12 hr tablet Take 1 tablet (150 mg total) by mouth daily. 30 tablet 0  . cromolyn (NASALCROM) 5.2 MG/ACT nasal spray Place 1 spray into both nostrils 4 (four) times daily. 26 mL 12  . diclofenac (VOLTAREN) 75 MG EC tablet Take 1 tablet (75 mg total) by mouth 2 (two) times daily as  needed for moderate pain. 20 tablet 0  . dicyclomine (BENTYL) 20 MG tablet Take 20 mg by mouth every 6 (six) hours as needed for spasms.     Marland Kitchen loratadine (CLARITIN) 10 MG tablet Take 10 mg by mouth daily as needed for allergies.     . metFORMIN (GLUCOPHAGE) 500 MG tablet Take 1 tablet (500 mg total) by mouth daily with breakfast. 30 tablet 0  . omeprazole (PRILOSEC) 40 MG capsule TAKE ONE CAPSULE BY MOUTH  DAILY (Patient taking differently: TAKE 40 MG BY MOUTH  DAILY) 30 capsule 3  . ondansetron (ZOFRAN ODT) 8 MG disintegrating tablet Take 1 tablet (8 mg total) by mouth every 8 (eight) hours as needed for nausea or vomiting. 30 tablet 0  . polyethylene glycol (MIRALAX / GLYCOLAX) packet Take 17 g by mouth daily as needed for moderate constipation.     . topiramate (TOPAMAX) 25 MG tablet Take 1 tablet (25 mg total) by mouth 2 times daily at 12 noon and 4 pm. 60 tablet 11  . Vitamin D, Ergocalciferol, (DRISDOL) 50000 units CAPS capsule Take 1 capsule (50,000 Units total) by mouth every 7 (seven) days. 4 capsule 0   No current facility-administered medications on file prior to visit.     BP 140/80 (BP Location: Left Wrist)   Temp 98.2 F (36.8 C) (Oral)   Wt (!) 410 lb (186 kg)   BMI 54.09 kg/m       Objective:   Physical Exam  Constitutional: She is oriented to person, place, and time. She appears well-developed and well-nourished. No distress.  HENT:  Head: Normocephalic and atraumatic.  Right Ear: Hearing, tympanic membrane, external ear and ear canal normal.  Left Ear: Hearing, tympanic membrane, external ear and ear canal normal.  Nose: Mucosal edema and rhinorrhea present. Right sinus exhibits maxillary sinus tenderness. Left sinus exhibits maxillary sinus tenderness.  Mouth/Throat: Uvula is midline, oropharynx is clear and moist and mucous membranes are normal. No oropharyngeal exudate.  Cardiovascular: Normal rate, regular rhythm, normal heart sounds and intact distal pulses. Exam  reveals no gallop and no friction rub.  No murmur heard. Pulmonary/Chest: Effort normal and breath sounds normal. No respiratory distress. She has no wheezes. She has no rales. She exhibits no tenderness.  Neurological: She is alert and oriented to person, place, and time.  Skin: Skin is warm and dry. No rash noted. She is not diaphoretic. No erythema. No pallor.  Psychiatric: She has a normal mood and affect. Her behavior is normal. Judgment and thought content normal.  Nursing note and vitals reviewed.     Assessment & Plan:  1. Elevated blood pressure reading -  Going to continue to monitor.  - Will add medication if needed  2. Acute non-recurrent maxillary sinusitis - Will treat due to symptoms and time frame  - doxycycline (VIBRAMYCIN) 100 MG capsule; Take 1 capsule (100 mg total) by mouth 2 (two) times daily.  Dispense: 14 capsule; Refill: 0 - Stay well hydrated and rest   Dorothyann Peng, NP

## 2017-12-02 ENCOUNTER — Ambulatory Visit (INDEPENDENT_AMBULATORY_CARE_PROVIDER_SITE_OTHER): Payer: BLUE CROSS/BLUE SHIELD | Admitting: Family Medicine

## 2017-12-02 VITALS — BP 123/69 | HR 89 | Temp 98.1°F | Ht 73.0 in | Wt >= 6400 oz

## 2017-12-02 DIAGNOSIS — R7303 Prediabetes: Secondary | ICD-10-CM

## 2017-12-02 DIAGNOSIS — Z6841 Body Mass Index (BMI) 40.0 and over, adult: Secondary | ICD-10-CM | POA: Diagnosis not present

## 2017-12-02 DIAGNOSIS — F3289 Other specified depressive episodes: Secondary | ICD-10-CM | POA: Diagnosis not present

## 2017-12-02 DIAGNOSIS — E559 Vitamin D deficiency, unspecified: Secondary | ICD-10-CM | POA: Diagnosis not present

## 2017-12-02 DIAGNOSIS — Z9189 Other specified personal risk factors, not elsewhere classified: Secondary | ICD-10-CM

## 2017-12-02 MED ORDER — METFORMIN HCL 500 MG PO TABS: 500.0000 mg | ORAL_TABLET | Freq: Every day | ORAL | 0 refills | Status: DC

## 2017-12-02 MED ORDER — VITAMIN D (ERGOCALCIFEROL) 1.25 MG (50000 UNIT) PO CAPS: 50000.0000 [IU] | ORAL_CAPSULE | ORAL | 0 refills | Status: DC

## 2017-12-02 MED ORDER — BUPROPION HCL ER (SR) 150 MG PO TB12: 150.0000 mg | ORAL_TABLET | Freq: Every day | ORAL | 0 refills | Status: DC

## 2017-12-02 NOTE — Progress Notes (Signed)
Office: [phone]  /  Fax: [phone]   HPI:   Chief Complaint: OBESITY Maureen Lewis is here to discuss her progress with her obesity treatment plan. She is on the Category 3 plan +300 calories and is following her eating plan approximately 25 % of the time. She states she is exercising 0 minutes 0 times per week. Maureen Lewis has been getting sick very frequently. She saw her PCP last week and  Was given antibiotics. Maureen Lewis has not been able to tolerate most of her meal plan, as she has been sick. Her weight is (!) 402 lb (182.3 kg) today and has maintained weight over a period of 3 weeks since her last visit. She has lost 2 lbs since starting treatment with [name].  Vitamin D deficiency Maureen Lewis has a diagnosis of vitamin D deficiency. She is currently taking vit D and denies nausea, vomiting (patient had vomiting with recent illness) or muscle weakness.   Ref. Range 08/13/2017 09:59  Vitamin D, 25-Hydroxy Latest Ref Range: 30.0 - 100.0 ng/mL 12.5 (L)   Pre-Diabetes Maureen Lewis has a diagnosis of pre-diabetes based on her elevated Hgb A1c and was informed this puts her at greater risk of developing diabetes. She is taking metformin currently and continues to work on diet and exercise to decrease risk of diabetes. She denies hyperphagia, polyuria or polydipsia.  Depression with emotional eating behaviors Maureen Lewis cravings are relatively controlled, but she reports having a poor appetite with her recent illness. Maureen Lewis struggles with emotional eating and using food for comfort to the extent that it is negatively impacting her health. She often snacks when she is not hungry. Maureen Lewis sometimes feels she is out of control and then feels guilty that she made poor food choices. She has been working on behavior modification techniques to help reduce her emotional eating and has been somewhat successful. She shows no sign of suicidal or homicidal ideations.  Depression screen Maureen Lewis 2/9 08/13/2017 05/30/2015 05/01/2015    Decreased Interest 3 0 0  Down, Depressed, Hopeless 2 0 0  PHQ - 2 Score 5 0 0  Altered sleeping 3 - -  Tired, decreased energy 3 - -  Change in appetite 2 - -  Feeling bad or failure about yourself  1 - -  Trouble concentrating 1 - -  Moving slowly or fidgety/restless 2 - -  Suicidal thoughts 0 - -  PHQ-9 Score 17 - -  Difficult doing work/chores Extremely dIfficult - -    ALLERGIES: Allergies  Allergen Reactions  . Bee Pollen Swelling, Shortness Of Breath and Hives  . Hydrocodone-Acetaminophen Anaphylaxis  . Peach Flavor Hives, Shortness Of Breath and Other (See Comments)    SWELLING OF MOUTH  . Pollen Extract Hives, Shortness Of Breath and Swelling    MEDICATIONS: Current Outpatient Medications on File Prior to Visit  Medication Sig Dispense Refill  . buPROPion (WELLBUTRIN SR) 150 MG 12 hr tablet Take 1 tablet (150 mg total) by mouth daily. 30 tablet 0  . cromolyn (NASALCROM) 5.2 MG/ACT nasal spray Place 1 spray into both nostrils 4 (four) times daily. 26 mL 12  . diclofenac (VOLTAREN) 75 MG EC tablet Take 1 tablet (75 mg total) by mouth 2 (two) times daily as needed for moderate pain. 20 tablet 0  . dicyclomine (BENTYL) 20 MG tablet Take 20 mg by mouth every 6 (six) hours as needed for spasms.     Marland Kitchen doxycycline (VIBRAMYCIN) 100 MG capsule Take 1 capsule (100 mg total) by mouth 2 (two) times  daily. 14 capsule 0  . loratadine (CLARITIN) 10 MG tablet Take 10 mg by mouth daily as needed for allergies.     . metFORMIN (GLUCOPHAGE) 500 MG tablet Take 1 tablet (500 mg total) by mouth daily with breakfast. 30 tablet 0  . omeprazole (PRILOSEC) 40 MG capsule TAKE ONE CAPSULE BY MOUTH  DAILY (Patient taking differently: TAKE 40 MG BY MOUTH  DAILY) 30 capsule 3  . ondansetron (ZOFRAN ODT) 8 MG disintegrating tablet Take 1 tablet (8 mg total) by mouth every 8 (eight) hours as needed for nausea or vomiting. 30 tablet 0  . polyethylene glycol (MIRALAX / GLYCOLAX) packet Take 17 g by mouth  daily as needed for moderate constipation.     . topiramate (TOPAMAX) 25 MG tablet Take 1 tablet (25 mg total) by mouth 2 times daily at 12 noon and 4 pm. 60 tablet 11  . Vitamin D, Ergocalciferol, (DRISDOL) 50000 units CAPS capsule Take 1 capsule (50,000 Units total) by mouth every 7 (seven) days. 4 capsule 0   No current facility-administered medications on file prior to visit.     PAST MEDICAL HISTORY: Past Medical History:  Diagnosis Date  . Acid reflux   . Anemia    no current med.  . Complication of anesthesia    states had to keep giving her anesthesia during EGD  . Constipation   . Finger mass, left 03/2017   middle finger  . Irritable bowel syndrome (IBS)   . Morbid obesity with body mass index (BMI) of 45.0 to 49.9 in adult [facility])   . Plantar fasciitis, bilateral   . Polycystic ovary syndrome   . Shortness of breath   . Sleep apnea    no CPAP use    PAST SURGICAL HISTORY: Past Surgical History:  Procedure Laterality Date  . COLONOSCOPY WITH PROPOFOL  10/07/2016  . ESOPHAGOGASTRODUODENOSCOPY  12/21/2015  . EXCISION MASS UPPER EXTREMETIES Left 04/21/2017   Procedure: EXCISION MASS LEFT MIDDLE FINGER;  Surgeon: Daryll Brod, MD;  Location: [place];  Service: Orthopedics;  Laterality: Left;    SOCIAL HISTORY: Social History   Tobacco Use  . Smoking status: Never Smoker  . Smokeless tobacco: Never Used  Substance Use Topics  . Alcohol use: Yes    Comment: social   . Drug use: No    FAMILY HISTORY: Family History  Problem Relation Age of Onset  . Diabetes Maternal Aunt   . Hypertension Maternal Uncle   . Depression Maternal Grandfather   . Diabetes Maternal Grandfather   . Hypertension Maternal Grandfather   . Arthritis Mother   . High blood pressure Mother   . Depression Mother   . Sleep apnea Mother   . Obesity Mother     ROS: Review of Systems  Constitutional: Negative for weight loss.  Gastrointestinal: Negative for nausea and  vomiting.  Genitourinary: Negative for frequency.  Musculoskeletal:       Negative for muscle weakness  Endo/Heme/Allergies: Negative for polydipsia.       Negative for hyperphagia  Psychiatric/Behavioral: Positive for depression. Negative for suicidal ideas.    PHYSICAL EXAM: Blood pressure 123/69, pulse 89, temperature 98.1 F (36.7 C), temperature source Oral, height 6\' 1"  (1.854 m), weight (!) 402 lb (182.3 kg), last menstrual period 11/22/2017, SpO2 95 %. Body mass index is 53.04 kg/m. Physical Exam  Constitutional: She is oriented to person, place, and time. She appears well-developed and well-nourished.  Cardiovascular: Normal rate.  Pulmonary/Chest: Effort normal.  Musculoskeletal:  Normal range of motion.  Neurological: She is oriented to person, place, and time.  Skin: Skin is warm and dry.  Psychiatric: She has a normal mood and affect. Her behavior is normal.  Vitals reviewed.   RECENT LABS AND TESTS: BMET    Component Value Date/Time   NA 141 08/20/2017 1459   NA 139 08/13/2017 0959   K 4.0 08/20/2017 1459   CL 105 08/20/2017 1459   CO2 24 08/13/2017 0959   GLUCOSE 104 (H) 08/20/2017 1459   BUN 5 (L) 08/20/2017 1459   BUN 9 08/13/2017 0959   CREATININE 0.60 08/20/2017 1459   CREATININE 0.53 02/23/2015 1002   CALCIUM 9.4 08/13/2017 0959   GFRNONAA 130 08/13/2017 0959   GFRAA 150 08/13/2017 0959   Lab Results  Component Value Date   HGBA1C 5.8 (H) 08/13/2017   HGBA1C 5.9 03/08/2014   HGBA1C 5.8 05/20/2012   Lab Results  Component Value Date   INSULIN 42.1 (H) 08/13/2017   CBC    Component Value Date/Time   WBC 5.9 08/13/2017 0959   WBC 6.7 05/28/2017 1011   RBC 5.48 (H) 08/13/2017 0959   RBC 5.29 05/28/2017 1011   HGB 12.6 08/20/2017 1459   HGB 12.0 08/13/2017 0959   HCT 37.0 08/20/2017 1459   HCT 38.3 08/13/2017 0959   PLT 262.0 05/28/2017 1011   MCV 70 (L) 08/13/2017 0959   MCH 21.9 (L) 08/13/2017 0959   MCH 21.2 (L) 07/26/2015 1315    MCHC 31.3 (L) 08/13/2017 0959   MCHC 30.8 (L) 05/28/2017 1011   RDW 15.9 (H) 08/13/2017 0959   LYMPHSABS 1.5 08/13/2017 0959   MONOABS 0.5 05/28/2017 1011   EOSABS 0.0 08/13/2017 0959   BASOSABS 0.0 08/13/2017 0959   Iron/TIBC/Ferritin/ %Sat No results found for: IRON, TIBC, FERRITIN, IRONPCTSAT Lipid Panel     Component Value Date/Time   CHOL 127 08/13/2017 0959   TRIG 54 08/13/2017 0959   HDL 39 (L) 08/13/2017 0959   CHOLHDL 3.4 02/23/2015 1002   VLDL 10 02/23/2015 1002   LDLCALC 77 08/13/2017 0959   Hepatic Function Panel     Component Value Date/Time   PROT 7.3 08/13/2017 0959   ALBUMIN 4.2 08/13/2017 0959   AST 14 08/13/2017 0959   ALT 16 08/13/2017 0959   ALKPHOS 98 08/13/2017 0959   BILITOT 0.6 08/13/2017 0959   BILIDIR 0.1 05/28/2017 1011      Component Value Date/Time   TSH 1.010 08/13/2017 0959   TSH 1.29 05/28/2017 1011   TSH 1.21 07/23/2016   TSH 1.913 04/13/2015 0938     Ref. Range 08/13/2017 09:59  Vitamin D, 25-Hydroxy Latest Ref Range: 30.0 - 100.0 ng/mL 12.5 (L)   ASSESSMENT AND PLAN: Vitamin D deficiency - Plan: Vitamin D, Ergocalciferol, (DRISDOL) 50000 units CAPS capsule  Other depression - With emotional eating - Plan: buPROPion (WELLBUTRIN SR) 150 MG 12 hr tablet  Prediabetes - Plan: metFORMIN (GLUCOPHAGE) 500 MG tablet  At risk for diabetes mellitus  Other depression - with emotional eating - Plan: buPROPion (WELLBUTRIN SR) 150 MG 12 hr tablet  Class 3 severe obesity with serious comorbidity and body mass index (BMI) of 50.0 to 59.9 in adult, unspecified obesity type ([place])  PLAN:  Vitamin D Deficiency Zoi was informed that low vitamin D levels contributes to fatigue and are associated with obesity, breast, and colon cancer. She agrees to continue to take prescription Vit D @50 ,000 IU every week and will follow up for routine testing of  vitamin D, at least 2-3 times per year. She was informed of the risk of over-replacement of vitamin  D and agrees to not increase her dose unless she discusses this with [name] first.  Pre-Diabetes Dellamae will continue to work on weight loss, exercise, and decreasing simple carbohydrates in her diet to help decrease the risk of diabetes. We dicussed metformin including benefits and risks. She was informed that eating too many simple carbohydrates or too many calories at one sitting increases the likelihood of GI side effects. Celene requested metformin for now and a prescription was written today for 1 month refill. We will recheck labs at her next visit and Danah agreed to follow up with [name] as directed to monitor her progress.  Depression with Emotional Eating Behaviors We discussed behavior modification techniques today to help Jadaya deal with her emotional eating and depression. She has agreed to continue Wellbutrin SR 150 mg qd #30 with no refills and follow up as directed.  Obesity Jermany is currently in the action stage of change. As such, her goal is to continue with weight loss efforts She doesn't feel able to commit to the category 3 plan at this time and wants to try to portion control better and make smarter food choices, such as increase vegetables and decrease simple carbohydrates for the next 2 weeks and re-commit at that time. Lashanti has been instructed to work up to a goal of 150 minutes of combined cardio and strengthening exercise per week for weight loss and overall health benefits. We discussed the following Behavioral Modification Strategies today: increasing lean protein intake, better snacking choices and planning for success  Cloe has agreed to follow up with our clinic in 2 weeks. She was informed of the importance of frequent follow up visits to maximize her success with intensive lifestyle modifications for her multiple health conditions.   OBESITY BEHAVIORAL INTERVENTION VISIT  Today's visit was # 5 out of 22.  Starting weight: 404 lbs Starting date: 08/13/17 Today's  weight : 402 lbs Today's date: 12/02/2017 Total lbs lost to date: 2 (Patients must lose 7 lbs in the first 6 months to continue with counseling)   ASK: We discussed the diagnosis of obesity with [place] today and Debrah agreed to give [name] permission to discuss obesity behavioral modification therapy today.  ASSESS: Ledia has the diagnosis of obesity and her BMI today is 53.05 Kaeley is in the action stage of change   ADVISE: Jaydalee was educated on the multiple health risks of obesity as well as the benefit of weight loss to improve her health. She was advised of the need for long term treatment and the importance of lifestyle modifications.  AGREE: Multiple dietary modification options and treatment options were discussed and  Yvette agreed to the above obesity treatment plan.  I, Doreene Nest, am acting as transcriptionist for Eber Jones, MD  I have reviewed the above documentation for accuracy and completeness, and I agree with the above. - Ilene Qua, MD

## 2017-12-09 ENCOUNTER — Encounter: Payer: Self-pay | Admitting: Adult Health

## 2017-12-09 ENCOUNTER — Ambulatory Visit (INDEPENDENT_AMBULATORY_CARE_PROVIDER_SITE_OTHER): Payer: BLUE CROSS/BLUE SHIELD | Admitting: Adult Health

## 2017-12-09 VITALS — BP 118/74 | Temp 98.6°F | Wt >= 6400 oz

## 2017-12-09 DIAGNOSIS — K5900 Constipation, unspecified: Secondary | ICD-10-CM

## 2017-12-09 DIAGNOSIS — R1030 Lower abdominal pain, unspecified: Secondary | ICD-10-CM | POA: Diagnosis not present

## 2017-12-09 LAB — POC URINALSYSI DIPSTICK (AUTOMATED)
Bilirubin, UA: NEGATIVE
GLUCOSE UA: NEGATIVE
KETONES UA: NEGATIVE
Leukocytes, UA: NEGATIVE
Nitrite, UA: NEGATIVE
Protein, UA: NEGATIVE
Spec Grav, UA: 1.025 (ref 1.010–1.025)
Urobilinogen, UA: 0.2 E.U./dL
pH, UA: 6.5 (ref 5.0–8.0)

## 2017-12-09 MED ORDER — LACTULOSE 10 GM/15ML PO SOLN: 10.0000 g | Freq: Two times a day (BID) | ORAL | 1 refills | Status: DC

## 2017-12-09 NOTE — Progress Notes (Addendum)
Subjective:    Patient ID: Maureen Lewis, female    DOB: Nov 10, 1995, 22 y.o.   MRN: [id number]  HPI  22 year old female who  has a past medical history of Acid reflux, Anemia, Complication of anesthesia, Constipation, Finger mass, left (03/2017), Irritable bowel syndrome (IBS), Morbid obesity with body mass index (BMI) of 45.0 to 49.9 in adult [facility]), Plantar fasciitis, bilateral, Polycystic ovary syndrome, Shortness of breath, and Sleep apnea. She presents to the office today for the complaint of LLQ pain that radiates around the left flank. She also has associated " fullness in her vagina". She reports that she has had this sensation for about one week. Denies any urinary symptoms. Her last bowel movement was 10 days ago, which is not abnormal for her. She has tried multiple different medications for constipation over the years. Currently has been taking Miralax daily for the last 4 days. She denies any vomiting but does have nausea.   Review of Systems See HPI   Past Medical History:  Diagnosis Date  . Acid reflux   . Anemia    no current med.  . Complication of anesthesia    states had to keep giving her anesthesia during EGD  . Constipation   . Finger mass, left 03/2017   middle finger  . Irritable bowel syndrome (IBS)   . Morbid obesity with body mass index (BMI) of 45.0 to 49.9 in adult [facility])   . Plantar fasciitis, bilateral   . Polycystic ovary syndrome   . Shortness of breath   . Sleep apnea    no CPAP use    Social History   Socioeconomic History  . Marital status: Single    Spouse name: Not on file  . Number of children: Not on file  . Years of education: Not on file  . Highest education level: Not on file  Social Needs  . Financial resource strain: Not on file  . Food insecurity - worry: Not on file  . Food insecurity - inability: Not on file  . Transportation needs - medical: Not on file  . Transportation needs - non-medical: Not on file  Occupational History   . Occupation: Chemical engineer: [id number]  Tobacco Use  . Smoking status: Never Smoker  . Smokeless tobacco: Never Used  Substance and Sexual Activity  . Alcohol use: Yes    Comment: social   . Drug use: No  . Sexual activity: No  Other Topics Concern  . Not on file  Social History Narrative   Lives with mom and cousin, a grandmother in [place] also helps care for her.       She is in nursing school.     Past Surgical History:  Procedure Laterality Date  . COLONOSCOPY WITH PROPOFOL  10/07/2016  . ESOPHAGOGASTRODUODENOSCOPY  12/21/2015  . EXCISION MASS UPPER EXTREMETIES Left 04/21/2017   Procedure: EXCISION MASS LEFT MIDDLE FINGER;  Surgeon: Daryll Brod, MD;  Location: [place];  Service: Orthopedics;  Laterality: Left;    Family History  Problem Relation Age of Onset  . Diabetes Maternal Aunt   . Hypertension Maternal Uncle   . Depression Maternal Grandfather   . Diabetes Maternal Grandfather   . Hypertension Maternal Grandfather   . Arthritis Mother   . High blood pressure Mother   . Depression Mother   . Sleep apnea Mother   . Obesity Mother     Allergies  Allergen Reactions  . Bee Pollen  Swelling, Shortness Of Breath and Hives  . Hydrocodone-Acetaminophen Anaphylaxis  . Peach Flavor Hives, Shortness Of Breath and Other (See Comments)    SWELLING OF MOUTH  . Pollen Extract Hives, Shortness Of Breath and Swelling    Current Outpatient Medications on File Prior to Visit  Medication Sig Dispense Refill  . buPROPion (WELLBUTRIN SR) 150 MG 12 hr tablet Take 1 tablet (150 mg total) by mouth daily. 30 tablet 0  . cromolyn (NASALCROM) 5.2 MG/ACT nasal spray Place 1 spray into both nostrils 4 (four) times daily. 26 mL 12  . diclofenac (VOLTAREN) 75 MG EC tablet Take 1 tablet (75 mg total) by mouth 2 (two) times daily as needed for moderate pain. 20 tablet 0  . dicyclomine (BENTYL) 20 MG tablet Take 20 mg by mouth every 6 (six) hours as  needed for spasms.     Marland Kitchen loratadine (CLARITIN) 10 MG tablet Take 10 mg by mouth daily as needed for allergies.     . metFORMIN (GLUCOPHAGE) 500 MG tablet Take 1 tablet (500 mg total) by mouth daily with breakfast. 30 tablet 0  . omeprazole (PRILOSEC) 40 MG capsule TAKE ONE CAPSULE BY MOUTH  DAILY (Patient taking differently: TAKE 40 MG BY MOUTH  DAILY) 30 capsule 3  . ondansetron (ZOFRAN ODT) 8 MG disintegrating tablet Take 1 tablet (8 mg total) by mouth every 8 (eight) hours as needed for nausea or vomiting. 30 tablet 0  . polyethylene glycol (MIRALAX / GLYCOLAX) packet Take 17 g by mouth daily as needed for moderate constipation.     . topiramate (TOPAMAX) 25 MG tablet Take 1 tablet (25 mg total) by mouth 2 times daily at 12 noon and 4 pm. 60 tablet 11  . Vitamin D, Ergocalciferol, (DRISDOL) 50000 units CAPS capsule Take 1 capsule (50,000 Units total) by mouth every 7 (seven) days. 4 capsule 0   No current facility-administered medications on file prior to visit.     BP 118/74 (BP Location: Left Wrist)   Temp 98.6 F (37 C) (Oral)   Wt (!) 408 lb (185.1 kg)   LMP 11/22/2017 (Exact Date)   BMI 53.83 kg/m       Objective:   Physical Exam  Constitutional: She is oriented to person, place, and time. She appears well-developed and well-nourished. No distress.  Cardiovascular: Normal rate, regular rhythm, normal heart sounds and intact distal pulses. Exam reveals no gallop and no friction rub.  No murmur heard. Pulmonary/Chest: Effort normal and breath sounds normal. No respiratory distress. She has no wheezes. She has no rales. She exhibits no tenderness.  Abdominal: Soft. Bowel sounds are normal. She exhibits no distension and no mass. There is tenderness in the left lower quadrant. There is no rebound, no guarding and no CVA tenderness.  Neurological: She is alert and oriented to person, place, and time.  Skin: Skin is warm and dry. She is not diaphoretic.  Psychiatric: She has a normal  mood and affect. Her behavior is normal. Judgment and thought content normal.  Nursing note and vitals reviewed.     Assessment & Plan:  I believe her symptoms are due to constipation. Will prescribe Lactulose 10 g BID, continue to take Miralax and add green leafy vegetables to diet. Follow up if no BM in the next 1-2 days  - UA - negative   Dorothyann Peng, NP

## 2017-12-09 NOTE — Progress Notes (Signed)
Subjective:    Patient ID: Maureen Lewis, female    DOB: [birth year], 22 y.o.   MRN: [id number]  HPI  22 year old female presents with LLQ, back and flank pain for 1 week.  Pain starts in the lower quadrant  and radiates to the flank and back.  Back is cramping alternates with a sharp pain.  LMP was 11/22/2017.  Last BM was approximately 10 days ago.  She takes Miralax 2 capfuls every day though she has just restarted this regimen in the last 3-4 days.   Review of Systems  Constitutional: Negative.   Respiratory: Negative.   Cardiovascular: Negative.   Gastrointestinal: Positive for abdominal pain, constipation and nausea. Negative for abdominal distention, diarrhea and vomiting.  Genitourinary: Positive for flank pain, pelvic pain and vaginal pain. Negative for decreased urine volume, difficulty urinating, dysuria, frequency, hematuria, urgency, vaginal bleeding and vaginal discharge.   Past Medical History:  Diagnosis Date  . Acid reflux   . Anemia    no current med.  . Complication of anesthesia    states had to keep giving her anesthesia during EGD  . Constipation   . Finger mass, left 03/2017   middle finger  . Irritable bowel syndrome (IBS)   . Morbid obesity with body mass index (BMI) of 45.0 to 49.9 in adult [facility])   . Plantar fasciitis, bilateral   . Polycystic ovary syndrome   . Shortness of breath   . Sleep apnea    no CPAP use    Social History   Socioeconomic History  . Marital status: Single    Spouse name: Not on file  . Number of children: Not on file  . Years of education: Not on file  . Highest education level: Not on file  Social Needs  . Financial resource strain: Not on file  . Food insecurity - worry: Not on file  . Food insecurity - inability: Not on file  . Transportation needs - medical: Not on file  . Transportation needs - non-medical: Not on file  Occupational History  . Occupation: Chemical engineer: [id number]  Tobacco Use  .  Smoking status: Never Smoker  . Smokeless tobacco: Never Used  Substance and Sexual Activity  . Alcohol use: Yes    Comment: social   . Drug use: No  . Sexual activity: No  Other Topics Concern  . Not on file  Social History Narrative   Lives with mom and cousin, a grandmother in [place] also helps care for her.       She is in nursing school.     Past Surgical History:  Procedure Laterality Date  . COLONOSCOPY WITH PROPOFOL  10/07/2016  . ESOPHAGOGASTRODUODENOSCOPY  12/21/2015  . EXCISION MASS UPPER EXTREMETIES Left 04/21/2017   Procedure: EXCISION MASS LEFT MIDDLE FINGER;  Surgeon: Daryll Brod, MD;  Location: [place];  Service: Orthopedics;  Laterality: Left;    Family History  Problem Relation Age of Onset  . Diabetes Maternal Aunt   . Hypertension Maternal Uncle   . Depression Maternal Grandfather   . Diabetes Maternal Grandfather   . Hypertension Maternal Grandfather   . Arthritis Mother   . High blood pressure Mother   . Depression Mother   . Sleep apnea Mother   . Obesity Mother     Allergies  Allergen Reactions  . Bee Pollen Swelling, Shortness Of Breath and Hives  . Hydrocodone-Acetaminophen Anaphylaxis  . Peach Flavor Hives, Shortness Of  Breath and Other (See Comments)    SWELLING OF MOUTH  . Pollen Extract Hives, Shortness Of Breath and Swelling    Current Outpatient Medications on File Prior to Visit  Medication Sig Dispense Refill  . buPROPion (WELLBUTRIN SR) 150 MG 12 hr tablet Take 1 tablet (150 mg total) by mouth daily. 30 tablet 0  . cromolyn (NASALCROM) 5.2 MG/ACT nasal spray Place 1 spray into both nostrils 4 (four) times daily. 26 mL 12  . diclofenac (VOLTAREN) 75 MG EC tablet Take 1 tablet (75 mg total) by mouth 2 (two) times daily as needed for moderate pain. 20 tablet 0  . dicyclomine (BENTYL) 20 MG tablet Take 20 mg by mouth every 6 (six) hours as needed for spasms.     Marland Kitchen loratadine (CLARITIN) 10 MG tablet Take 10 mg by  mouth daily as needed for allergies.     . metFORMIN (GLUCOPHAGE) 500 MG tablet Take 1 tablet (500 mg total) by mouth daily with breakfast. 30 tablet 0  . omeprazole (PRILOSEC) 40 MG capsule TAKE ONE CAPSULE BY MOUTH  DAILY (Patient taking differently: TAKE 40 MG BY MOUTH  DAILY) 30 capsule 3  . ondansetron (ZOFRAN ODT) 8 MG disintegrating tablet Take 1 tablet (8 mg total) by mouth every 8 (eight) hours as needed for nausea or vomiting. 30 tablet 0  . polyethylene glycol (MIRALAX / GLYCOLAX) packet Take 17 g by mouth daily as needed for moderate constipation.     . topiramate (TOPAMAX) 25 MG tablet Take 1 tablet (25 mg total) by mouth 2 times daily at 12 noon and 4 pm. 60 tablet 11  . Vitamin D, Ergocalciferol, (DRISDOL) 50000 units CAPS capsule Take 1 capsule (50,000 Units total) by mouth every 7 (seven) days. 4 capsule 0   No current facility-administered medications on file prior to visit.     BP 118/74 (BP Location: Left Wrist)   Temp 98.6 F (37 C) (Oral)   Wt (!) 408 lb (185.1 kg)   LMP 11/22/2017 (Exact Date)   BMI 53.83 kg/m      Objective:   Physical Exam  Constitutional: She is oriented to person, place, and time. She appears well-developed and well-nourished. No distress.  Cardiovascular: Normal rate, regular rhythm and normal heart sounds. Exam reveals no gallop and no friction rub.  No murmur heard. Pulmonary/Chest: Effort normal and breath sounds normal. No respiratory distress. She has no wheezes. She has no rales.  Abdominal: Soft. Bowel sounds are normal. She exhibits no distension and no mass. There is tenderness in the left lower quadrant. There is no rebound and no guarding.  Abdominal pain LLQ to left flank and left back.   Neurological: She is alert and oriented to person, place, and time.  Skin: She is not diaphoretic.  Nursing note and vitals reviewed.     Assessment & Plan:  1. Lower abdominal pain Lactulose 39ml PO BID Continue Miralax.  Use OTC stool  softener. Please send MyChart message in 2 days if you ave not had a bowel movement. - POCT Urinalysis Dipstick (Automated)  Ania Levay C Sung Parodi BSN RN NP student

## 2017-12-14 ENCOUNTER — Ambulatory Visit: Payer: Self-pay | Admitting: Nurse Practitioner

## 2017-12-17 ENCOUNTER — Ambulatory Visit (INDEPENDENT_AMBULATORY_CARE_PROVIDER_SITE_OTHER): Payer: BLUE CROSS/BLUE SHIELD | Admitting: Family Medicine

## 2017-12-17 ENCOUNTER — Encounter (INDEPENDENT_AMBULATORY_CARE_PROVIDER_SITE_OTHER): Payer: Self-pay

## 2018-01-12 ENCOUNTER — Ambulatory Visit: Payer: Self-pay | Admitting: Neurology

## 2018-01-12 ENCOUNTER — Telehealth: Payer: Self-pay | Admitting: Neurology

## 2018-01-12 NOTE — Telephone Encounter (Signed)
Pt no showed to apt and also has been non compliant with CPAP use

## 2018-01-13 ENCOUNTER — Encounter: Payer: Self-pay | Admitting: Neurology

## 2018-02-24 ENCOUNTER — Encounter: Payer: Self-pay | Admitting: Adult Health

## 2018-02-24 ENCOUNTER — Ambulatory Visit (INDEPENDENT_AMBULATORY_CARE_PROVIDER_SITE_OTHER): Payer: BLUE CROSS/BLUE SHIELD | Admitting: Adult Health

## 2018-02-24 VITALS — BP 134/80 | Temp 98.7°F | Wt 397.0 lb

## 2018-02-24 DIAGNOSIS — M25562 Pain in left knee: Secondary | ICD-10-CM | POA: Diagnosis not present

## 2018-02-24 DIAGNOSIS — N644 Mastodynia: Secondary | ICD-10-CM | POA: Diagnosis not present

## 2018-02-24 NOTE — Progress Notes (Signed)
Subjective:    Patient ID: Maureen Lewis, female    DOB: [birth year], 22 y.o.   MRN: [id number]  HPI  22 year old female who  has a past medical history of Acid reflux, Anemia, Complication of anesthesia, Constipation, Finger mass, left (03/2017), Irritable bowel syndrome (IBS), Morbid obesity with body mass index (BMI) of 45.0 to 49.9 in adult [facility]), Plantar fasciitis, bilateral, Polycystic ovary syndrome, Shortness of breath, and Sleep apnea.  She presents to the office today for two separate acute issues.   1. Bilateral breast pain - this has been an intermittent issue for over the last four months. Pain is felt as " ripping and tearing pain as well as sharp pain". Does report tenderness with palpation. Recently had a friend who is 46 y/o being diagnosed with breast cancer. Does report breast pain that increases during menstrual cycle.   2. Left knee pain - started one week ago after she twisted her knee after stepping in a hole in the yard. Pain is worse with walking and rest. Has been using tylenol with no relief. Pain has not improved over the last week    Review of Systems See HPI   Past Medical History:  Diagnosis Date  . Acid reflux   . Anemia    no current med.  . Complication of anesthesia    states had to keep giving her anesthesia during EGD  . Constipation   . Finger mass, left 03/2017   middle finger  . Irritable bowel syndrome (IBS)   . Morbid obesity with body mass index (BMI) of 45.0 to 49.9 in adult [facility])   . Plantar fasciitis, bilateral   . Polycystic ovary syndrome   . Shortness of breath   . Sleep apnea    no CPAP use    Social History   Socioeconomic History  . Marital status: Single    Spouse name: Not on file  . Number of children: Not on file  . Years of education: Not on file  . Highest education level: Not on file  Occupational History  . Occupation: Chemical engineer: [employer]  . Financial resource strain: Not on  file  . Food insecurity:    Worry: Not on file    Inability: Not on file  . Transportation needs:    Medical: Not on file    Non-medical: Not on file  Tobacco Use  . Smoking status: Never Smoker  . Smokeless tobacco: Never Used  Substance and Sexual Activity  . Alcohol use: Yes    Comment: social   . Drug use: No  . Sexual activity: Never  Lifestyle  . Physical activity:    Days per week: Not on file    Minutes per session: Not on file  . Stress: Not on file  Relationships  . Social connections:    Talks on phone: Not on file    Gets together: Not on file    Attends religious service: Not on file    Active member of club or organization: Not on file    Attends meetings of clubs or organizations: Not on file    Relationship status: Not on file  . Intimate partner violence:    Fear of current or ex partner: Not on file    Emotionally abused: Not on file    Physically abused: Not on file    Forced sexual activity: Not on file  Other Topics Concern  . Not on  file  Social History Narrative   Lives with mom and cousin, a grandmother in [place] also helps care for her.       She is in nursing school.     Past Surgical History:  Procedure Laterality Date  . COLONOSCOPY WITH PROPOFOL  10/07/2016  . ESOPHAGOGASTRODUODENOSCOPY  12/21/2015  . EXCISION MASS UPPER EXTREMETIES Left 04/21/2017   Procedure: EXCISION MASS LEFT MIDDLE FINGER;  Surgeon: Daryll Brod, MD;  Location: [place];  Service: Orthopedics;  Laterality: Left;    Family History  Problem Relation Age of Onset  . Diabetes Maternal Aunt   . Hypertension Maternal Uncle   . Depression Maternal Grandfather   . Diabetes Maternal Grandfather   . Hypertension Maternal Grandfather   . Arthritis Mother   . High blood pressure Mother   . Depression Mother   . Sleep apnea Mother   . Obesity Mother     Allergies  Allergen Reactions  . Bee Pollen Swelling, Shortness Of Breath and Hives  .  Hydrocodone-Acetaminophen Anaphylaxis  . Peach Flavor Hives, Shortness Of Breath and Other (See Comments)    SWELLING OF MOUTH  . Pollen Extract Hives, Shortness Of Breath and Swelling    Current Outpatient Medications on File Prior to Visit  Medication Sig Dispense Refill  . buPROPion (WELLBUTRIN SR) 150 MG 12 hr tablet Take 1 tablet (150 mg total) by mouth daily. 30 tablet 0  . cromolyn (NASALCROM) 5.2 MG/ACT nasal spray Place 1 spray into both nostrils 4 (four) times daily. 26 mL 12  . diclofenac (VOLTAREN) 75 MG EC tablet Take 1 tablet (75 mg total) by mouth 2 (two) times daily as needed for moderate pain. 20 tablet 0  . dicyclomine (BENTYL) 20 MG tablet Take 20 mg by mouth every 6 (six) hours as needed for spasms.     Marland Kitchen lactulose (CHRONULAC) 10 GM/15ML solution Take 15 mLs (10 g total) by mouth 2 (two) times daily. 472 mL 1  . loratadine (CLARITIN) 10 MG tablet Take 10 mg by mouth daily as needed for allergies.     . metFORMIN (GLUCOPHAGE) 500 MG tablet Take 1 tablet (500 mg total) by mouth daily with breakfast. 30 tablet 0  . omeprazole (PRILOSEC) 40 MG capsule TAKE ONE CAPSULE BY MOUTH  DAILY (Patient taking differently: TAKE 40 MG BY MOUTH  DAILY) 30 capsule 3  . ondansetron (ZOFRAN ODT) 8 MG disintegrating tablet Take 1 tablet (8 mg total) by mouth every 8 (eight) hours as needed for nausea or vomiting. 30 tablet 0  . polyethylene glycol (MIRALAX / GLYCOLAX) packet Take 17 g by mouth daily as needed for moderate constipation.     . topiramate (TOPAMAX) 25 MG tablet Take 1 tablet (25 mg total) by mouth 2 times daily at 12 noon and 4 pm. 60 tablet 11  . Vitamin D, Ergocalciferol, (DRISDOL) 50000 units CAPS capsule Take 1 capsule (50,000 Units total) by mouth every 7 (seven) days. 4 capsule 0   No current facility-administered medications on file prior to visit.     BP 134/80 (BP Location: Left Wrist)   Temp 98.7 F (37.1 C) (Oral)   Wt (!) 397 lb (180.1 kg)   BMI 52.38 kg/m        Objective:   Physical Exam  Constitutional: She is oriented to person, place, and time. She appears well-developed and well-nourished. No distress.  Cardiovascular: Normal rate, regular rhythm, normal heart sounds and intact distal pulses. Exam reveals no gallop and  no friction rub.  No murmur heard. Pulmonary/Chest: Effort normal and breath sounds normal. No stridor. No respiratory distress. She has no wheezes. She has no rales. She exhibits no tenderness. Right breast exhibits tenderness. Right breast exhibits no inverted nipple, no mass, no nipple discharge and no skin change. Left breast exhibits tenderness. Left breast exhibits no inverted nipple, no mass, no nipple discharge and no skin change. No breast swelling.  Fibrotic breast tissue     Abdominal: Soft. Bowel sounds are normal.  Musculoskeletal: She exhibits tenderness. She exhibits no edema or deformity.       Left knee: She exhibits decreased range of motion and bony tenderness. She exhibits no swelling, no effusion, no erythema, normal alignment, normal patellar mobility and normal meniscus. Tenderness found. Medial joint line, MCL and LCL tenderness noted. No patellar tendon tenderness noted.  + decreased ROM with exercises + pain with straight leg raise, knee to chest, and internal and external rotation.  + walks with limping gait   Neurological: She is alert and oriented to person, place, and time.  Skin: Skin is warm and dry. Capillary refill takes less than 2 seconds. She is not diaphoretic.  Psychiatric: She has a normal mood and affect. Her behavior is normal. Judgment and thought content normal.  Nursing note and vitals reviewed.     Assessment & Plan:  1. Acute pain of left knee - MR Knee Left  Wo Contrast; Future - Advised motrin, ice, and rest.  2. Breast pain - Advised it was very rare for someone her age to have breast cancer. She has large breasts with fibrotic tissue. She still wants to do a mammogram to make  sure there is nothing wrong.  - MM Digital Diagnostic Bilat; Future   Dorothyann Peng, NP

## 2018-03-02 ENCOUNTER — Other Ambulatory Visit: Payer: Self-pay | Admitting: Adult Health

## 2018-03-02 DIAGNOSIS — N644 Mastodynia: Secondary | ICD-10-CM

## 2018-03-05 ENCOUNTER — Institutional Professional Consult (permissible substitution): Payer: BLUE CROSS/BLUE SHIELD | Admitting: Pulmonary Disease

## 2018-03-07 ENCOUNTER — Other Ambulatory Visit: Payer: BLUE CROSS/BLUE SHIELD

## 2018-03-12 ENCOUNTER — Ambulatory Visit
Admission: RE | Admit: 2018-03-12 | Discharge: 2018-03-12 | Disposition: A | Discharge status: Home / Self Care | Payer: BLUE CROSS/BLUE SHIELD | Source: Ambulatory Visit | Attending: Adult Health | Admitting: Adult Health

## 2018-03-12 DIAGNOSIS — N644 Mastodynia: Secondary | ICD-10-CM

## 2018-03-30 ENCOUNTER — Ambulatory Visit (INDEPENDENT_AMBULATORY_CARE_PROVIDER_SITE_OTHER): Payer: BLUE CROSS/BLUE SHIELD | Admitting: Adult Health

## 2018-03-30 ENCOUNTER — Encounter: Payer: Self-pay | Admitting: Adult Health

## 2018-03-30 VITALS — BP 110/70 | Temp 98.3°F | Wt >= 6400 oz

## 2018-03-30 DIAGNOSIS — T7421XA Adult sexual abuse, confirmed, initial encounter: Secondary | ICD-10-CM | POA: Diagnosis not present

## 2018-03-30 DIAGNOSIS — T7840XA Allergy, unspecified, initial encounter: Secondary | ICD-10-CM

## 2018-03-30 LAB — POC URINALSYSI DIPSTICK (AUTOMATED)
Bilirubin, UA: NEGATIVE
GLUCOSE UA: NEGATIVE
Ketones, UA: NEGATIVE
Leukocytes, UA: NEGATIVE
NITRITE UA: NEGATIVE
PROTEIN UA: POSITIVE — AB
RBC UA: 3
Spec Grav, UA: 1.02 (ref 1.010–1.025)
Urobilinogen, UA: 0.2 E.U./dL
pH, UA: 7 (ref 5.0–8.0)

## 2018-03-30 LAB — POCT URINE PREGNANCY: Preg Test, Ur: NEGATIVE

## 2018-03-30 MED ORDER — PREDNISONE 10 MG PO TABS: 10.0000 mg | ORAL_TABLET | Freq: Every day | ORAL | 0 refills | Status: DC

## 2018-03-30 NOTE — Progress Notes (Signed)
pocpo
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
## 2018-03-31 ENCOUNTER — Other Ambulatory Visit (HOSPITAL_COMMUNITY)
Admission: RE | Admit: 2018-03-31 | Discharge: 2018-03-31 | Disposition: A | Discharge status: Home / Self Care | Payer: BLUE CROSS/BLUE SHIELD | Source: Ambulatory Visit | Attending: Adult Health | Admitting: Adult Health

## 2018-03-31 ENCOUNTER — Encounter: Payer: Self-pay | Admitting: Adult Health

## 2018-03-31 DIAGNOSIS — T7421XA Adult sexual abuse, confirmed, initial encounter: Secondary | ICD-10-CM | POA: Insufficient documentation

## 2018-03-31 NOTE — Addendum Note (Signed)
Addended by: Rene Kocher on: 03/31/2018 12:40 PM   Modules accepted: Orders

## 2018-04-01 ENCOUNTER — Ambulatory Visit (INDEPENDENT_AMBULATORY_CARE_PROVIDER_SITE_OTHER): Payer: BLUE CROSS/BLUE SHIELD | Admitting: Family Medicine

## 2018-04-01 VITALS — BP 115/64 | HR 72 | Temp 98.3°F | Ht 73.0 in | Wt 395.0 lb

## 2018-04-01 DIAGNOSIS — R7303 Prediabetes: Secondary | ICD-10-CM

## 2018-04-01 DIAGNOSIS — Z9189 Other specified personal risk factors, not elsewhere classified: Secondary | ICD-10-CM

## 2018-04-01 DIAGNOSIS — F3289 Other specified depressive episodes: Secondary | ICD-10-CM | POA: Diagnosis not present

## 2018-04-01 DIAGNOSIS — E559 Vitamin D deficiency, unspecified: Secondary | ICD-10-CM

## 2018-04-01 DIAGNOSIS — Z6841 Body Mass Index (BMI) 40.0 and over, adult: Secondary | ICD-10-CM

## 2018-04-01 LAB — HEPATITIS C ANTIBODY
Hepatitis C Ab: NONREACTIVE
SIGNAL TO CUT-OFF: 0.02 (ref <1.00)

## 2018-04-01 LAB — URINE CYTOLOGY ANCILLARY ONLY
CHLAMYDIA, DNA PROBE: NEGATIVE
NEISSERIA GONORRHEA: NEGATIVE
Trichomonas: NEGATIVE

## 2018-04-01 LAB — HIV ANTIBODY (ROUTINE TESTING W REFLEX): HIV 1&2 Ab, 4th Generation: NONREACTIVE

## 2018-04-01 MED ORDER — VITAMIN D (ERGOCALCIFEROL) 1.25 MG (50000 UNIT) PO CAPS: 50000.0000 [IU] | ORAL_CAPSULE | ORAL | 0 refills | Status: DC

## 2018-04-01 MED ORDER — METFORMIN HCL 500 MG PO TABS: 500.0000 mg | ORAL_TABLET | Freq: Two times a day (BID) | ORAL | 0 refills | Status: DC

## 2018-04-01 MED ORDER — BUPROPION HCL ER (SR) 150 MG PO TB12: 150.0000 mg | ORAL_TABLET | Freq: Every day | ORAL | 0 refills | Status: DC

## 2018-04-01 NOTE — Progress Notes (Signed)
Office: [phone]  /  Fax: [phone]   HPI:   Chief Complaint: OBESITY Maureen Lewis is here to discuss her progress with her obesity treatment plan. She is on the Category 3 plan and is following her eating plan approximately 50 % of the time. She states she is exercising 0 minutes 0 times per week. Taraji continues to do well with weight loss. She is doing well with meal prep and regular grocery shopping. Her weight is (!) 395 lb (179.2 kg) today and has had a weight loss of 7 pounds over a period of 4 months since her last visit. She has lost 9 lbs since starting treatment with [name].  Pre-Diabetes Maureen Lewis has a diagnosis of prediabetes based on her elevated Hgb A1c and was informed this puts her at greater risk of developing diabetes. She is stable on metformin, but she is still having irregular periods. She is doing very well with weight loss. Maureen Lewis continues to work on diet and exercise to decrease risk of diabetes. She denies nausea or hypoglycemia.  At risk for diabetes Maureen Lewis is at higher than average risk for developing diabetes due to her obesity and pre-diabetes. She currently denies polyuria or polydipsia.  Vitamin D deficiency Maureen Lewis has a diagnosis of vitamin D deficiency. Maureen Lewis is stable on vit D, but she is not yet at goal. She denies nausea, vomiting or muscle weakness.  Depression with emotional eating behaviors Maureen Lewis is struggling with emotional eating and using food for comfort to the extent that it is negatively impacting her health. She often snacks when she is not hungry. Maureen Lewis sometimes feels she is out of control and then feels guilty that she made poor food choices. She has been working on behavior modification techniques to help reduce her emotional eating and has been somewhat successful. Her mood is stable and she has no worsening insomnia. Her blood pressure is stable and she shows no sign of suicidal or homicidal ideations.  Depression screen Maureen Lewis 2/9 08/13/2017  05/30/2015 05/01/2015  Decreased Interest 3 0 0  Down, Depressed, Hopeless 2 0 0  PHQ - 2 Score 5 0 0  Altered sleeping 3 - -  Tired, decreased energy 3 - -  Change in appetite 2 - -  Feeling bad or failure about yourself  1 - -  Trouble concentrating 1 - -  Moving slowly or fidgety/restless 2 - -  Suicidal thoughts 0 - -  PHQ-9 Score 17 - -  Difficult doing work/chores Extremely dIfficult - -     ALLERGIES: Allergies  Allergen Reactions  . Bee Pollen Swelling, Shortness Of Breath and Hives  . Hydrocodone-Acetaminophen Anaphylaxis  . Peach Flavor Hives, Shortness Of Breath and Other (See Comments)    SWELLING OF MOUTH  . Pollen Extract Hives, Shortness Of Breath and Swelling    MEDICATIONS: Current Outpatient Medications on File Prior to Visit  Medication Sig Dispense Refill  . cromolyn (NASALCROM) 5.2 MG/ACT nasal spray Place 1 spray into both nostrils 4 (four) times daily. 26 mL 12  . diclofenac (VOLTAREN) 75 MG EC tablet Take 1 tablet (75 mg total) by mouth 2 (two) times daily as needed for moderate pain. 20 tablet 0  . dicyclomine (BENTYL) 20 MG tablet Take 20 mg by mouth every 6 (six) hours as needed for spasms.     Marland Kitchen lactulose (CHRONULAC) 10 GM/15ML solution Take 15 mLs (10 g total) by mouth 2 (two) times daily. 472 mL 1  . loratadine (CLARITIN) 10 MG tablet Take  10 mg by mouth daily as needed for allergies.     Marland Kitchen omeprazole (PRILOSEC) 40 MG capsule TAKE ONE CAPSULE BY MOUTH  DAILY (Patient taking differently: TAKE 40 MG BY MOUTH  DAILY) 30 capsule 3  . ondansetron (ZOFRAN ODT) 8 MG disintegrating tablet Take 1 tablet (8 mg total) by mouth every 8 (eight) hours as needed for nausea or vomiting. 30 tablet 0  . polyethylene glycol (MIRALAX / GLYCOLAX) packet Take 17 g by mouth daily as needed for moderate constipation.     . predniSONE (DELTASONE) 10 MG tablet Take 1 tablet (10 mg total) by mouth daily with breakfast. 5 tablet 0  . topiramate (TOPAMAX) 25 MG tablet Take 1  tablet (25 mg total) by mouth 2 times daily at 12 noon and 4 pm. 60 tablet 11   No current facility-administered medications on file prior to visit.     PAST MEDICAL HISTORY: Past Medical History:  Diagnosis Date  . Acid reflux   . Anemia    no current med.  . Complication of anesthesia    states had to keep giving her anesthesia during EGD  . Constipation   . Finger mass, left 03/2017   middle finger  . Irritable bowel syndrome (IBS)   . Morbid obesity with body mass index (BMI) of 45.0 to 49.9 in adult [facility])   . Plantar fasciitis, bilateral   . Polycystic ovary syndrome   . Shortness of breath   . Sleep apnea    no CPAP use    PAST SURGICAL HISTORY: Past Surgical History:  Procedure Laterality Date  . COLONOSCOPY WITH PROPOFOL  10/07/2016  . ESOPHAGOGASTRODUODENOSCOPY  12/21/2015  . EXCISION MASS UPPER EXTREMETIES Left 04/21/2017   Procedure: EXCISION MASS LEFT MIDDLE FINGER;  Surgeon: Daryll Brod, MD;  Location: [place];  Service: Orthopedics;  Laterality: Left;    SOCIAL HISTORY: Social History   Tobacco Use  . Smoking status: Never Smoker  . Smokeless tobacco: Never Used  Substance Use Topics  . Alcohol use: Yes    Comment: social   . Drug use: No    FAMILY HISTORY: Family History  Problem Relation Age of Onset  . Diabetes Maternal Aunt   . Hypertension Maternal Uncle   . Depression Maternal Grandfather   . Diabetes Maternal Grandfather   . Hypertension Maternal Grandfather   . Arthritis Mother   . High blood pressure Mother   . Depression Mother   . Sleep apnea Mother   . Obesity Mother     ROS: Review of Systems  Constitutional: Positive for weight loss.  Gastrointestinal: Negative for nausea and vomiting.  Genitourinary: Negative for frequency.  Musculoskeletal:       Negative for muscle weakness  Endo/Heme/Allergies: Negative for polydipsia.       Negative for hypoglycemia  Psychiatric/Behavioral: Positive for depression.  Negative for suicidal ideas. The patient has insomnia.     PHYSICAL EXAM: Blood pressure 115/64, pulse 72, temperature 98.3 F (36.8 C), temperature source Oral, height 6\' 1"  (1.854 m), weight (!) 395 lb (179.2 kg), last menstrual period 03/28/2018, SpO2 98 %. Body mass index is 52.11 kg/m. Physical Exam  Constitutional: She is oriented to person, place, and time. She appears well-developed and well-nourished.  Cardiovascular: Normal rate.  Pulmonary/Chest: Effort normal.  Musculoskeletal: Normal range of motion.  Neurological: She is oriented to person, place, and time.  Skin: Skin is warm and dry.  Psychiatric: She has a normal mood and affect. Her behavior  is normal.  Vitals reviewed.   RECENT LABS AND TESTS: BMET    Component Value Date/Time   NA 141 08/20/2017 1459   NA 139 08/13/2017 0959   K 4.0 08/20/2017 1459   CL 105 08/20/2017 1459   CO2 24 08/13/2017 0959   GLUCOSE 104 (H) 08/20/2017 1459   BUN 5 (L) 08/20/2017 1459   BUN 9 08/13/2017 0959   CREATININE 0.60 08/20/2017 1459   CREATININE 0.53 02/23/2015 1002   CALCIUM 9.4 08/13/2017 0959   GFRNONAA 130 08/13/2017 0959   GFRAA 150 08/13/2017 0959   Lab Results  Component Value Date   HGBA1C 5.8 (H) 08/13/2017   HGBA1C 5.9 03/08/2014   HGBA1C 5.8 05/20/2012   Lab Results  Component Value Date   INSULIN 42.1 (H) 08/13/2017   CBC    Component Value Date/Time   WBC 5.9 08/13/2017 0959   WBC 6.7 05/28/2017 1011   RBC 5.48 (H) 08/13/2017 0959   RBC 5.29 05/28/2017 1011   HGB 12.6 08/20/2017 1459   HGB 12.0 08/13/2017 0959   HCT 37.0 08/20/2017 1459   HCT 38.3 08/13/2017 0959   PLT 262.0 05/28/2017 1011   MCV 70 (L) 08/13/2017 0959   MCH 21.9 (L) 08/13/2017 0959   MCH 21.2 (L) 07/26/2015 1315   MCHC 31.3 (L) 08/13/2017 0959   MCHC 30.8 (L) 05/28/2017 1011   RDW 15.9 (H) 08/13/2017 0959   LYMPHSABS 1.5 08/13/2017 0959   MONOABS 0.5 05/28/2017 1011   EOSABS 0.0 08/13/2017 0959   BASOSABS 0.0  08/13/2017 0959   Iron/TIBC/Ferritin/ %Sat No results found for: IRON, TIBC, FERRITIN, IRONPCTSAT Lipid Panel     Component Value Date/Time   CHOL 127 08/13/2017 0959   TRIG 54 08/13/2017 0959   HDL 39 (L) 08/13/2017 0959   CHOLHDL 3.4 02/23/2015 1002   VLDL 10 02/23/2015 1002   LDLCALC 77 08/13/2017 0959   Hepatic Function Panel     Component Value Date/Time   PROT 7.3 08/13/2017 0959   ALBUMIN 4.2 08/13/2017 0959   AST 14 08/13/2017 0959   ALT 16 08/13/2017 0959   ALKPHOS 98 08/13/2017 0959   BILITOT 0.6 08/13/2017 0959   BILIDIR 0.1 05/28/2017 1011      Component Value Date/Time   TSH 1.010 08/13/2017 0959   TSH 1.29 05/28/2017 1011   TSH 1.21 07/23/2016   TSH 1.913 04/13/2015 0938   Results for Maureen, Lewis (MRN [id number]) as of 04/01/2018 17:01  Ref. Range 08/13/2017 09:59  Vitamin D, 25-Hydroxy Latest Ref Range: 30.0 - 100.0 ng/mL 12.5 (L)   ASSESSMENT AND PLAN: Prediabetes - Plan: metFORMIN (GLUCOPHAGE) 500 MG tablet  Vitamin D deficiency - Plan: Vitamin D, Ergocalciferol, (DRISDOL) 50000 units CAPS capsule  Other depression - With emotional eating - Plan: buPROPion (WELLBUTRIN SR) 150 MG 12 hr tablet  At risk for diabetes mellitus  Class 3 severe obesity with serious comorbidity and body mass index (BMI) of 50.0 to 59.9 in adult, unspecified obesity type ([place])  PLAN:  Pre-Diabetes Maureen Lewis will continue to work on weight loss, exercise, and decreasing simple carbohydrates in her diet to help decrease the risk of diabetes. We dicussed metformin including benefits and risks. She was informed that eating too many simple carbohydrates or too many calories at one sitting increases the likelihood of GI side effects. Carry agrees to increase metformin for 1 month 500 mg BID #60 with no refills. Marshia agreed to follow up with [name] as directed to monitor her progress.  Diabetes  risk counseling Khanh was given extended (15 minutes) diabetes prevention counseling today.  She is 22 y.o. female and has risk factors for diabetes including obesity and pre-diabetes. We discussed intensive lifestyle modifications today with an emphasis on weight loss as well as increasing exercise and decreasing simple carbohydrates in her diet.  Vitamin D Deficiency Maureen Lewis was informed that low vitamin D levels contributes to fatigue and are associated with obesity, breast, and colon cancer. She agrees to continue to take prescription Vit D @50 ,000 IU every week #4 with no refills and will follow up for routine testing of vitamin D, at least 2-3 times per year. She was informed of the risk of over-replacement of vitamin D and agrees to not increase her dose unless she discusses this with [name] first. Maureen Lewis agrees to follow up as directed.  Depression with Emotional Eating Behaviors We discussed behavior modification techniques today to help Maureen Lewis deal with her emotional eating and depression. She has agreed to continue Wellbutrin SR 150 mg qd #30 with no refills and follow up as directed.  Obesity Maureen Lewis is currently in the action stage of change. As such, her goal is to continue with weight loss efforts She has agreed to follow the Category 3 plan +300 calories Maureen Lewis has been instructed to work up to a goal of 150 minutes of combined cardio and strengthening exercise per week for weight loss and overall health benefits. We discussed the following Behavioral Modification Strategies today: increasing lean protein intake  Maureen Lewis has agreed to follow up with our clinic in 2 to 3 weeks. She was informed of the importance of frequent follow up visits to maximize her success with intensive lifestyle modifications for her multiple health conditions.   OBESITY BEHAVIORAL INTERVENTION VISIT  Today's visit was # 6 out of 22.  Starting weight: 404 lbs Starting date: 08/13/17 Today's weight : 395 lbs Today's date: 04/01/2018 Total lbs lost to date: 9 (Patients must lose 7 lbs in the first 6  months to continue with counseling)   ASK: We discussed the diagnosis of obesity with Maureen Lewis today and Maureen Lewis agreed to give [name] permission to discuss obesity behavioral modification therapy today.  ASSESS: Maureen Lewis has the diagnosis of obesity and her BMI today is 52.13 Maureen Lewis is in the action stage of change   ADVISE: Parris was educated on the multiple health risks of obesity as well as the benefit of weight loss to improve her health. She was advised of the need for long term treatment and the importance of lifestyle modifications.  AGREE: Multiple dietary modification options and treatment options were discussed and  Maureen Lewis agreed to the above obesity treatment plan.  I, Doreene Nest, am acting as transcriptionist for Dennard Nip, MD  I have reviewed the above documentation for accuracy and completeness, and I agree with the above. -Dennard Nip, MD

## 2018-04-12 ENCOUNTER — Encounter: Payer: Self-pay | Admitting: Adult Health

## 2018-04-12 ENCOUNTER — Ambulatory Visit (INDEPENDENT_AMBULATORY_CARE_PROVIDER_SITE_OTHER): Payer: BLUE CROSS/BLUE SHIELD | Admitting: Adult Health

## 2018-04-12 ENCOUNTER — Encounter: Payer: Self-pay | Admitting: Family Medicine

## 2018-04-12 VITALS — BP 130/86 | HR 95 | Temp 99.0°F | Wt >= 6400 oz

## 2018-04-12 DIAGNOSIS — J0140 Acute pansinusitis, unspecified: Secondary | ICD-10-CM

## 2018-04-12 MED ORDER — FLUTICASONE PROPIONATE 50 MCG/ACT NA SUSP: 2.0000 | Freq: Every day | NASAL | 6 refills | Status: DC

## 2018-04-12 MED ORDER — BENZONATATE 200 MG PO CAPS: 200.0000 mg | ORAL_CAPSULE | Freq: Two times a day (BID) | ORAL | 0 refills | Status: DC | PRN

## 2018-04-12 MED ORDER — DOXYCYCLINE HYCLATE 100 MG PO CAPS: 100.0000 mg | ORAL_CAPSULE | Freq: Two times a day (BID) | ORAL | 0 refills | Status: DC

## 2018-04-12 NOTE — Progress Notes (Signed)
   Subjective:    Patient ID: Maureen Lewis, female    DOB: [birth year], 22 y.o.   MRN: [id number]  Sinusitis  This is a new problem. The current episode started 1 to 4 weeks ago. The problem has been gradually worsening since onset. There has been no fever (subjective ). Associated symptoms include chills, congestion, coughing (productive ), ear pain, headaches, shortness of breath, sinus pressure and a sore throat. Treatments tried: sudafed  The treatment provided no relief.    Review of Systems  Constitutional: Positive for chills, fatigue and fever.  HENT: Positive for congestion, ear pain, postnasal drip, sinus pressure, sinus pain, sore throat and trouble swallowing.   Respiratory: Positive for cough (productive ) and shortness of breath.   Cardiovascular: Negative.   Gastrointestinal: Positive for nausea. Negative for constipation and vomiting.  Neurological: Positive for headaches.       Objective:   Physical Exam  Constitutional: She is oriented to person, place, and time. She appears well-developed and well-nourished. No distress.  HENT:  Head: Normocephalic and atraumatic.  Right Ear: Hearing and external ear normal. A middle ear effusion is present.  Left Ear: Hearing and external ear normal. A middle ear effusion is present.  Nose: Mucosal edema and rhinorrhea present. Right sinus exhibits no maxillary sinus tenderness and no frontal sinus tenderness. Left sinus exhibits maxillary sinus tenderness and frontal sinus tenderness.  Mouth/Throat: Oropharynx is clear and moist. No oropharyngeal exudate.  Neck: Normal range of motion. Neck supple.  Cardiovascular: Normal rate, regular rhythm, normal heart sounds and intact distal pulses. Exam reveals no gallop and no friction rub.  No murmur heard. Pulmonary/Chest: Effort normal and breath sounds normal. No stridor. No respiratory distress. She has no wheezes. She has no rales. She exhibits no tenderness.  Neurological: She is alert  and oriented to person, place, and time.  Skin: Skin is warm and dry. She is not diaphoretic.  Psychiatric: She has a normal mood and affect. Her behavior is normal. Judgment and thought content normal.  Nursing note and vitals reviewed.     Assessment & Plan:  1. Acute non-recurrent pansinusitis - doxycycline (VIBRAMYCIN) 100 MG capsule; Take 1 capsule (100 mg total) by mouth 2 (two) times daily.  Dispense: 14 capsule; Refill: 0 - fluticasone (FLONASE) 50 MCG/ACT nasal spray; Place 2 sprays into both nostrils daily.  Dispense: 16 g; Refill: 6 - benzonatate (TESSALON) 200 MG capsule; Take 1 capsule (200 mg total) by mouth 2 (two) times daily as needed for cough.  Dispense: 20 capsule; Refill: 0 - Stay hydrated and rest - Follow up at clinic if no improvement in the next 2-3 days   Dorothyann Peng, NP

## 2018-04-20 ENCOUNTER — Encounter (INDEPENDENT_AMBULATORY_CARE_PROVIDER_SITE_OTHER): Payer: Self-pay

## 2018-04-20 ENCOUNTER — Ambulatory Visit (INDEPENDENT_AMBULATORY_CARE_PROVIDER_SITE_OTHER): Payer: BLUE CROSS/BLUE SHIELD | Admitting: Family Medicine

## 2018-04-27 ENCOUNTER — Ambulatory Visit (INDEPENDENT_AMBULATORY_CARE_PROVIDER_SITE_OTHER): Payer: BLUE CROSS/BLUE SHIELD | Admitting: Adult Health

## 2018-04-27 ENCOUNTER — Encounter: Payer: Self-pay | Admitting: Adult Health

## 2018-04-27 VITALS — BP 118/76 | Temp 98.1°F | Ht 71.5 in | Wt 399.0 lb

## 2018-04-27 DIAGNOSIS — K5900 Constipation, unspecified: Secondary | ICD-10-CM

## 2018-04-27 DIAGNOSIS — Z0000 Encounter for general adult medical examination without abnormal findings: Secondary | ICD-10-CM | POA: Diagnosis not present

## 2018-04-27 DIAGNOSIS — Z114 Encounter for screening for human immunodeficiency virus [HIV]: Secondary | ICD-10-CM

## 2018-04-27 DIAGNOSIS — F3289 Other specified depressive episodes: Secondary | ICD-10-CM | POA: Diagnosis not present

## 2018-04-27 DIAGNOSIS — E559 Vitamin D deficiency, unspecified: Secondary | ICD-10-CM | POA: Diagnosis not present

## 2018-04-27 DIAGNOSIS — R7303 Prediabetes: Secondary | ICD-10-CM | POA: Diagnosis not present

## 2018-04-27 DIAGNOSIS — Z6841 Body Mass Index (BMI) 40.0 and over, adult: Secondary | ICD-10-CM

## 2018-04-27 LAB — LIPID PANEL
Cholesterol: 117 mg/dL (ref 0–200)
HDL: 36.4 mg/dL — ABNORMAL LOW (ref >39.00)
LDL Cholesterol: 72 mg/dL (ref 0–99)
NONHDL: 80.56
Total CHOL/HDL Ratio: 3
Triglycerides: 45 mg/dL (ref 0.0–149.0)
VLDL: 9 mg/dL (ref 0.0–40.0)

## 2018-04-27 LAB — VITAMIN D 25 HYDROXY (VIT D DEFICIENCY, FRACTURES): VITD: 11.69 ng/mL — ABNORMAL LOW (ref 30.00–100.00)

## 2018-04-27 LAB — BASIC METABOLIC PANEL
BUN: 9 mg/dL (ref 6–23)
CHLORIDE: 105 meq/L (ref 96–112)
CO2: 27 mEq/L (ref 19–32)
CREATININE: 0.51 mg/dL (ref 0.40–1.20)
Calcium: 8.8 mg/dL (ref 8.4–10.5)
GFR: 198.17 mL/min (ref >60.00)
Glucose, Bld: 99 mg/dL (ref 70–99)
Potassium: 4.4 mEq/L (ref 3.5–5.1)
Sodium: 139 mEq/L (ref 135–145)

## 2018-04-27 LAB — TSH: TSH: 0.95 u[IU]/mL (ref 0.40–5.00)

## 2018-04-27 LAB — HEPATIC FUNCTION PANEL
ALK PHOS: 80 U/L (ref 47–119)
ALT: 14 U/L (ref 0–35)
AST: 15 U/L (ref 0–37)
Albumin: 3.9 g/dL (ref 3.5–5.2)
BILIRUBIN DIRECT: 0.1 mg/dL (ref 0.0–0.3)
TOTAL PROTEIN: 7.3 g/dL (ref 6.0–8.3)
Total Bilirubin: 0.7 mg/dL (ref 0.2–1.2)

## 2018-04-27 MED ORDER — LACTULOSE 10 GM/15ML PO SOLN
10.0000 g | Freq: Two times a day (BID) | ORAL | 1 refills | Status: DC
Start: 2018-04-27 — End: 2019-02-25

## 2018-04-27 NOTE — Progress Notes (Signed)
Subjective:    Patient ID: Maureen Lewis, female    DOB: 20-Nov-1995, 22 y.o.   MRN: [id number]  HPI   Patient presents for yearly preventative medicine examination. She is a pleasant 22 year old female who  has a past medical history of Acid reflux, Anemia, Complication of anesthesia, Constipation, Finger mass, left (03/2017), Irritable bowel syndrome (IBS), Morbid obesity with body mass index (BMI) of 45.0 to 49.9 in adult [facility]), Plantar fasciitis, bilateral, Polycystic ovary syndrome, Shortness of breath, and Sleep apnea.  Obesity -currently being followed by weight loss clinic, Dr. Leafy Ro.  Was last seen April 01, 2018, at which time she had a weight loss of 7 pounds over period of 4 months since her last visit. She reports that she is meal prepping nightly and has not been eating out  Wt Readings from Last 3 Encounters:  04/27/18 (!) 399 lb (181 kg) (>99 %, Z= 3.23)*  04/12/18 (!) 400 lb (181.4 kg) (>99 %, Z= 3.22)*  04/01/18 (!) 395 lb (179.2 kg) (>99 %, Z= 3.20)*   * Growth percentiles are based on CDC (Girls, 2-20 Years) data.    Pre Diabetes -currently prescribed metformin 500 mg by mouth twice a day. Denies any GI issues  Lab Results  Component Value Date   HGBA1C 5.8 (H) 08/13/2017   Vitamin D Deficiency -currently prescribed vitamin D 50,000 units weekly-last vitamin D level; only 8 months ago was 12.5.  She reports taking medication as directed  Depression with emotional eating behaviors -currently prescribed Wellbutrin 150 mg daily.  She reports that she continues to struggle with emotional eating and is using food for comfort.  She often snacks when she is not hungry.  She reports that she is been working on behavior modification techniques to help reduce her emotional eating and has been starting to note some success  Migraine - is followed by Neurology. Currently prescribed Topamax - reports good results with this medication   All immunizations and health maintenance  protocols were reviewed with the patient and needed orders were placed. She is UTD on vaccinations.   Appropriate screening laboratory values were ordered for the patient including screening of hyperlipidemia, renal function and hepatic function.  Medication reconciliation,  past medical history, social history, problem list and allergies were reviewed in detail with the patient  Goals were established with regard to weight loss, exercise, and  diet in compliance with medications -She has been instructed by the weight loss clinic to work up to a goal of 150 minutes of combined cardio and strengthening exercises per week.  Review of Systems  Constitutional: Negative.   HENT: Negative.   Eyes: Negative.   Respiratory: Negative.   Cardiovascular: Negative.   Gastrointestinal: Negative.   Endocrine: Negative.   Genitourinary: Negative.   Musculoskeletal: Negative.   Skin: Negative.   Allergic/Immunologic: Negative.   Neurological: Negative.   Hematological: Negative.   Psychiatric/Behavioral: Positive for sleep disturbance. Negative for self-injury and suicidal ideas. The patient is not nervous/anxious.   All other systems reviewed and are negative.  Past Medical History:  Diagnosis Date  . Acid reflux   . Anemia    no current med.  . Complication of anesthesia    states had to keep giving her anesthesia during EGD  . Constipation   . Finger mass, left 03/2017   middle finger  . Irritable bowel syndrome (IBS)   . Morbid obesity with body mass index (BMI) of 45.0 to 49.9 in adult [facility])   .  Plantar fasciitis, bilateral   . Polycystic ovary syndrome   . Shortness of breath   . Sleep apnea    no CPAP use    Social History   Socioeconomic History  . Marital status: Single    Spouse name: Not on file  . Number of children: Not on file  . Years of education: Not on file  . Highest education level: Not on file  Occupational History  . Occupation: Optician, dispensing: [employer]  . Financial resource strain: Not on file  . Food insecurity:    Worry: Not on file    Inability: Not on file  . Transportation needs:    Medical: Not on file    Non-medical: Not on file  Tobacco Use  . Smoking status: Never Smoker  . Smokeless tobacco: Never Used  Substance and Sexual Activity  . Alcohol use: Yes    Comment: social   . Drug use: No  . Sexual activity: Never  Lifestyle  . Physical activity:    Days per week: Not on file    Minutes per session: Not on file  . Stress: Not on file  Relationships  . Social connections:    Talks on phone: Not on file    Gets together: Not on file    Attends religious service: Not on file    Active member of club or organization: Not on file    Attends meetings of clubs or organizations: Not on file    Relationship status: Not on file  . Intimate partner violence:    Fear of current or ex partner: Not on file    Emotionally abused: Not on file    Physically abused: Not on file    Forced sexual activity: Not on file  Other Topics Concern  . Not on file  Social History Narrative   Lives with mom and cousin, a grandmother in [place] also helps care for her.       She is in nursing school.     Past Surgical History:  Procedure Laterality Date  . COLONOSCOPY WITH PROPOFOL  10/07/2016  . ESOPHAGOGASTRODUODENOSCOPY  12/21/2015  . EXCISION MASS UPPER EXTREMETIES Left 04/21/2017   Procedure: EXCISION MASS LEFT MIDDLE FINGER;  Surgeon: Daryll Brod, MD;  Location: [place];  Service: Orthopedics;  Laterality: Left;    Family History  Problem Relation Age of Onset  . Diabetes Maternal Aunt   . Hypertension Maternal Uncle   . Depression Maternal Grandfather   . Diabetes Maternal Grandfather   . Hypertension Maternal Grandfather   . Arthritis Mother   . High blood pressure Mother   . Depression Mother   . Sleep apnea Mother   . Obesity Mother     Allergies  Allergen  Reactions  . Bee Pollen Swelling, Shortness Of Breath and Hives  . Hydrocodone-Acetaminophen Anaphylaxis  . Peach Flavor Hives, Shortness Of Breath and Other (See Comments)    SWELLING OF MOUTH  . Pollen Extract Hives, Shortness Of Breath and Swelling    Current Outpatient Medications on File Prior to Visit  Medication Sig Dispense Refill  . buPROPion (WELLBUTRIN SR) 150 MG 12 hr tablet Take 1 tablet (150 mg total) by mouth daily. 30 tablet 0  . cromolyn (NASALCROM) 5.2 MG/ACT nasal spray Place 1 spray into both nostrils 4 (four) times daily. 26 mL 12  . diclofenac (VOLTAREN) 75 MG EC tablet Take 1 tablet (75 mg total) by mouth  2 (two) times daily as needed for moderate pain. 20 tablet 0  . dicyclomine (BENTYL) 20 MG tablet Take 20 mg by mouth every 6 (six) hours as needed for spasms.     . fluticasone (FLONASE) 50 MCG/ACT nasal spray Place 2 sprays into both nostrils daily. 16 g 6  . lactulose (CHRONULAC) 10 GM/15ML solution Take 15 mLs (10 g total) by mouth 2 (two) times daily. 472 mL 1  . loratadine (CLARITIN) 10 MG tablet Take 10 mg by mouth daily as needed for allergies.     . metFORMIN (GLUCOPHAGE) 500 MG tablet Take 1 tablet (500 mg total) by mouth 2 (two) times daily with a meal. 60 tablet 0  . omeprazole (PRILOSEC) 40 MG capsule TAKE ONE CAPSULE BY MOUTH  DAILY (Patient taking differently: TAKE 40 MG BY MOUTH  DAILY) 30 capsule 3  . ondansetron (ZOFRAN ODT) 8 MG disintegrating tablet Take 1 tablet (8 mg total) by mouth every 8 (eight) hours as needed for nausea or vomiting. 30 tablet 0  . polyethylene glycol (MIRALAX / GLYCOLAX) packet Take 17 g by mouth daily as needed for moderate constipation.     . topiramate (TOPAMAX) 25 MG tablet Take 1 tablet (25 mg total) by mouth 2 times daily at 12 noon and 4 pm. 60 tablet 11  . Vitamin D, Ergocalciferol, (DRISDOL) 50000 units CAPS capsule Take 1 capsule (50,000 Units total) by mouth every 7 (seven) days. 4 capsule 0   No current  facility-administered medications on file prior to visit.     BP 118/76   Temp 98.1 F (36.7 C) (Oral)   Ht 5' 11.5" (1.816 m)   Wt (!) 399 lb (181 kg)   LMP 03/28/2018 (Exact Date)   BMI 54.87 kg/m       Objective:   Physical Exam  Constitutional: She is oriented to person, place, and time. She appears well-developed and well-nourished. No distress.  Obese    HENT:  Head: Normocephalic and atraumatic.  Right Ear: External ear normal.  Left Ear: External ear normal.  Nose: Nose normal.  Mouth/Throat: Oropharynx is clear and moist. No oropharyngeal exudate.  Eyes: Pupils are equal, round, and reactive to light. Conjunctivae are normal. Right eye exhibits no discharge. Left eye exhibits no discharge. No scleral icterus.  Neck: Normal range of motion. Neck supple. No JVD present. No tracheal deviation present. No thyromegaly present.  Cardiovascular: Normal rate, regular rhythm, normal heart sounds and intact distal pulses. Exam reveals no gallop and no friction rub.  No murmur heard. Pulmonary/Chest: Effort normal and breath sounds normal. No stridor. No respiratory distress. She has no wheezes. She has no rales. She exhibits no tenderness.  Abdominal: Soft. Bowel sounds are normal. She exhibits no distension and no mass. There is no tenderness. There is no rebound and no guarding. No hernia.  Musculoskeletal: Normal range of motion. She exhibits no edema, tenderness or deformity.  Lymphadenopathy:    She has no cervical adenopathy.  Neurological: She is alert and oriented to person, place, and time. She displays normal reflexes. No cranial nerve deficit or sensory deficit. She exhibits normal muscle tone. Coordination normal.  Skin: Skin is warm and dry. Capillary refill takes less than 2 seconds. No rash noted. She is not diaphoretic. No erythema. No pallor.  Psychiatric: She has a normal mood and affect. Her behavior is normal. Judgment and thought content normal.  Nursing note  and vitals reviewed.     Assessment & Plan:  1.  Routine general medical examination at a health care facility  - Basic metabolic panel - CBC with Differential/Platelet - Hemoglobin A1c - Hepatic function panel - Lipid panel - TSH  2. Vitamin D deficiency  - Vitamin D, 25-hydroxy  3. Prediabetes - Continue with Metformin and heart healthy diet  - Basic metabolic panel - CBC with Differential/Platelet - Hemoglobin A1c - Hepatic function panel - Lipid panel - TSH  4. Other depression - Continue with Wellbutrin   5. Class 3 severe obesity with serious comorbidity and body mass index (BMI) of 50.0 to 59.9 in adult, unspecified obesity type ([place]) - Continue to work with weight loss clinic. Encouraged auerobic exercise. Continue with meal planning, keep food diary  - Basic metabolic panel - CBC with Differential/Platelet - Hemoglobin A1c - Hepatic function panel - Lipid panel - TSH  6. Encounter for screening for HIV  - HIV antibody  7. Constipation, unspecified constipation type - Advised to stop Bentyl  - lactulose (CHRONULAC) 10 GM/15ML solution; Take 15 mLs (10 g total) by mouth 2 (two) times daily.  Dispense: 472 mL; Refill: 1  Dorothyann Peng, NP

## 2018-04-28 LAB — CBC WITH DIFFERENTIAL/PLATELET
Basophils Absolute: 0 10*3/uL (ref 0.0–0.1)
Basophils Relative: 0.5 % (ref 0.0–3.0)
Eosinophils Absolute: 0 10*3/uL (ref 0.0–0.7)
Eosinophils Relative: 0.8 % (ref 0.0–5.0)
HEMATOCRIT: 36.7 % (ref 36.0–49.0)
Hemoglobin: 11.7 g/dL — ABNORMAL LOW (ref 12.0–16.0)
LYMPHS PCT: 35.3 % (ref 24.0–48.0)
Lymphs Abs: 1.9 10*3/uL (ref 0.7–4.0)
MCHC: 31.8 g/dL (ref 31.0–37.0)
MCV: 71.9 fl — AB (ref 78.0–98.0)
MONOS PCT: 6.4 % (ref 3.0–12.0)
Monocytes Absolute: 0.4 10*3/uL (ref 0.1–1.0)
NEUTROS ABS: 3.1 10*3/uL (ref 1.4–7.7)
Neutrophils Relative %: 57 % (ref 43.0–71.0)
PLATELETS: 241 10*3/uL (ref 150.0–575.0)
RBC: 5.11 Mil/uL (ref 3.80–5.70)
RDW: 15.8 % — ABNORMAL HIGH (ref 11.4–15.5)
WBC: 5.5 10*3/uL (ref 4.5–13.5)

## 2018-04-28 LAB — HEMOGLOBIN A1C: Hgb A1c MFr Bld: 6 % (ref 4.6–6.5)

## 2018-04-28 LAB — HIV ANTIBODY (ROUTINE TESTING W REFLEX): HIV: NONREACTIVE

## 2018-07-09 ENCOUNTER — Encounter: Payer: BLUE CROSS/BLUE SHIELD | Admitting: Certified Nurse Midwife

## 2018-07-15 ENCOUNTER — Encounter: Payer: Self-pay | Admitting: Obstetrics and Gynecology

## 2018-07-15 ENCOUNTER — Ambulatory Visit (INDEPENDENT_AMBULATORY_CARE_PROVIDER_SITE_OTHER): Payer: BLUE CROSS/BLUE SHIELD | Admitting: Obstetrics and Gynecology

## 2018-07-15 VITALS — BP 112/78 | Ht 71.25 in | Wt 387.0 lb

## 2018-07-15 DIAGNOSIS — N913 Primary oligomenorrhea: Secondary | ICD-10-CM

## 2018-07-15 DIAGNOSIS — N920 Excessive and frequent menstruation with regular cycle: Secondary | ICD-10-CM | POA: Diagnosis not present

## 2018-07-15 DIAGNOSIS — B372 Candidiasis of skin and nail: Secondary | ICD-10-CM

## 2018-07-15 DIAGNOSIS — N898 Other specified noninflammatory disorders of vagina: Secondary | ICD-10-CM

## 2018-07-15 DIAGNOSIS — E282 Polycystic ovarian syndrome: Secondary | ICD-10-CM | POA: Diagnosis not present

## 2018-07-15 DIAGNOSIS — Z862 Personal history of diseases of the blood and blood-forming organs and certain disorders involving the immune mechanism: Secondary | ICD-10-CM

## 2018-07-15 DIAGNOSIS — Z01419 Encounter for gynecological examination (general) (routine) without abnormal findings: Secondary | ICD-10-CM

## 2018-07-15 MED ORDER — NYSTATIN 100000 UNIT/GM EX CREA: 1.0000 "application " | TOPICAL_CREAM | Freq: Two times a day (BID) | CUTANEOUS | 0 refills | Status: DC

## 2018-07-15 MED ORDER — IBUPROFEN 800 MG PO TABS: 800.0000 mg | ORAL_TABLET | Freq: Three times a day (TID) | ORAL | 1 refills | Status: DC | PRN

## 2018-07-15 MED ORDER — NORETHIN ACE-ETH ESTRAD-FE 1-20 MG-MCG PO TABS: 1.0000 | ORAL_TABLET | Freq: Every day | ORAL | 0 refills | Status: DC

## 2018-07-15 NOTE — Patient Instructions (Signed)
EXERCISE AND DIET:  We recommended that you start or continue a regular exercise program for good health. Regular exercise means any activity that makes your heart beat faster and makes you sweat.  We recommend exercising at least 30 minutes per day at least 3 days a week, preferably 4 or 5.  We also recommend a diet low in fat and sugar.  Inactivity, poor dietary choices and obesity can cause diabetes, heart attack, stroke, and kidney damage, among others.    ALCOHOL AND SMOKING:  Women should limit their alcohol intake to no more than 7 drinks/beers/glasses of wine (combined, not each!) per week. Moderation of alcohol intake to this level decreases your risk of breast cancer and liver damage. And of course, no recreational drugs are part of a healthy lifestyle.  And absolutely no smoking or even second hand smoke. Most people know smoking can cause heart and lung diseases, but did you know it also contributes to weakening of your bones? Aging of your skin?  Yellowing of your teeth and nails?  CALCIUM AND VITAMIN D:  Adequate intake of calcium and Vitamin D are recommended.  The recommendations for exact amounts of these supplements seem to change often, but generally speaking 600 mg of calcium (either carbonate or citrate) and 800 units of Vitamin D per day seems prudent. Certain women may benefit from higher intake of Vitamin D.  If you are among these women, your doctor will have told you during your visit.    PAP SMEARS:  Pap smears, to check for cervical cancer or precancers,  have traditionally been done yearly, although recent scientific advances have shown that most women can have pap smears less often.  However, every woman still should have a physical exam from her gynecologist every year. It will include a breast check, inspection of the vulva and vagina to check for abnormal growths or skin changes, a visual exam of the cervix, and then an exam to evaluate the size and shape of the uterus and  ovaries.  And after 20 years of age, a rectal exam is indicated to check for rectal cancers. We will also provide age appropriate advice regarding health maintenance, like when you should have certain vaccines, screening for sexually transmitted diseases, bone density testing, colonoscopy, mammograms, etc.   MAMMOGRAMS:  All women over 40 years old should have a yearly mammogram. Many facilities now offer a "3D" mammogram, which may cost around $50 extra out of pocket. If possible,  we recommend you accept the option to have the 3D mammogram performed.  It both reduces the number of women who will be called back for extra views which then turn out to be normal, and it is better than the routine mammogram at detecting truly abnormal areas.    COLONOSCOPY:  Colonoscopy to screen for colon cancer is recommended for all women at age 50.  We know, you hate the idea of the prep.  We agree, BUT, having colon cancer and not knowing it is worse!!  Colon cancer so often starts as a polyp that can be seen and removed at colonscopy, which can quite literally save your life!  And if your first colonoscopy is normal and you have no family history of colon cancer, most women don't have to have it again for 10 years.  Once every ten years, you can do something that may end up saving your life, right?  We will be happy to help you get it scheduled when you are ready.    Be sure to check your insurance coverage so you understand how much it will cost.  It may be covered as a preventative service at no cost, but you should check your particular policy.      Breast Self-Awareness Breast self-awareness means being familiar with how your breasts look and feel. It involves checking your breasts regularly and reporting any changes to your health care provider. Practicing breast self-awareness is important. A change in your breasts can be a sign of a serious medical problem. Being familiar with how your breasts look and feel allows  you to find any problems early, when treatment is more likely to be successful. All women should practice breast self-awareness, including women who have had breast implants. How to do a breast self-exam One way to learn what is normal for your breasts and whether your breasts are changing is to do a breast self-exam. To do a breast self-exam: Look for Changes  1. Remove all the clothing above your waist. 2. Stand in front of a mirror in a room with good lighting. 3. Put your hands on your hips. 4. Push your hands firmly downward. 5. Compare your breasts in the mirror. Look for differences between them (asymmetry), such as: ? Differences in shape. ? Differences in size. ? Puckers, dips, and bumps in one breast and not the other. 6. Look at each breast for changes in your skin, such as: ? Redness. ? Scaly areas. 7. Look for changes in your nipples, such as: ? Discharge. ? Bleeding. ? Dimpling. ? Redness. ? A change in position. Feel for Changes  Carefully feel your breasts for lumps and changes. It is best to do this while lying on your back on the floor and again while sitting or standing in the shower or tub with soapy water on your skin. Feel each breast in the following way:  Place the arm on the side of the breast you are examining above your head.  Feel your breast with the other hand.  Start in the nipple area and make  inch (2 cm) overlapping circles to feel your breast. Use the pads of your three middle fingers to do this. Apply light pressure, then medium pressure, then firm pressure. The light pressure will allow you to feel the tissue closest to the skin. The medium pressure will allow you to feel the tissue that is a little deeper. The firm pressure will allow you to feel the tissue close to the ribs.  Continue the overlapping circles, moving downward over the breast until you feel your ribs below your breast.  Move one finger-width toward the center of the body.  Continue to use the  inch (2 cm) overlapping circles to feel your breast as you move slowly up toward your collarbone.  Continue the up and down exam using all three pressures until you reach your armpit.  Write Down What You Find  Write down what is normal for each breast and any changes that you find. Keep a written record with breast changes or normal findings for each breast. By writing this information down, you do not need to depend only on memory for size, tenderness, or location. Write down where you are in your menstrual cycle, if you are still menstruating. If you are having trouble noticing differences in your breasts, do not get discouraged. With time you will become more familiar with the variations in your breasts and more comfortable with the exam. How often should I examine my breasts? Examine   your breasts every month. If you are breastfeeding, the best time to examine your breasts is after a feeding or after using a breast pump. If you menstruate, the best time to examine your breasts is 5-7 days after your period is over. During your period, your breasts are lumpier, and it may be more difficult to notice changes. When should I see my health care provider? See your health care provider if you notice:  A change in shape or size of your breasts or nipples.  A change in the skin of your breast or nipples, such as a reddened or scaly area.  Unusual discharge from your nipples.  A lump or thick area that was not there before.  Pain in your breasts.  Anything that concerns you.  This information is not intended to replace advice given to you by your health care provider. Make sure you discuss any questions you have with your health care provider. Document Released: 10/06/2005 Document Revised: 03/13/2016 Document Reviewed: 08/26/2015 Elsevier Interactive Patient Education  2018 [employer].  Oral Contraception Information Oral contraceptive pills (OCPs) are medicines  taken to prevent pregnancy. OCPs work by preventing the ovaries from releasing eggs. The hormones in OCPs also cause the cervical mucus to thicken, preventing the sperm from entering the uterus. The hormones also cause the uterine lining to become thin, not allowing a fertilized egg to attach to the inside of the uterus. OCPs are highly effective when taken exactly as prescribed. However, OCPs do not prevent sexually transmitted diseases (STDs). Safe sex practices, such as using condoms along with the pill, can help prevent STDs. Before taking the pill, you may have a physical exam and Pap test. Your health care provider may order blood tests. The health care provider will make sure you are a good candidate for oral contraception. Discuss with your health care provider the possible side effects of the OCP you may be prescribed. When starting an OCP, it can take 2 to 3 months for the body to adjust to the changes in hormone levels in your body. Types of oral contraception  The combination pill-This pill contains estrogen and progestin (synthetic progesterone) hormones. The combination pill comes in 21-day, 28-day, or 91-day packs. Some types of combination pills are meant to be taken continuously (365-day pills). With 21-day packs, you do not take pills for 7 days after the last pill. With 28-day packs, the pill is taken every day. The last 7 pills are without hormones. Certain types of pills have more than 21 hormone-containing pills. With 91-day packs, the first 84 pills contain both hormones, and the last 7 pills contain no hormones or contain estrogen only.  The minipill-This pill contains the progesterone hormone only. The pill is taken every day continuously. It is very important to take the pill at the same time each day. The minipill comes in packs of 28 pills. All 28 pills contain the hormone. Advantages of oral contraceptive pills  Decreases premenstrual symptoms.  Treats menstrual period  cramps.  Regulates the menstrual cycle.  Decreases a heavy menstrual flow.  May treatacne, depending on the type of pill.  Treats abnormal uterine bleeding.  Treats polycystic ovarian syndrome.  Treats endometriosis.  Can be used as emergency contraception. Things that can make oral contraceptive pills less effective OCPs can be less effective if:  You forget to take the pill at the same time every day.  You have a stomach or intestinal disease that lessens the absorption of the pill.  You take OCPs with other medicines that make OCPs less effective, such as antibiotics, certain HIV medicines, and some seizure medicines.  You take expired OCPs.  You forget to restart the pill on day 7, when using the packs of 21 pills.  Risks associated with oral contraceptive pills Oral contraceptive pills can sometimes cause side effects, such as:  Headache.  Nausea.  Breast tenderness.  Irregular bleeding or spotting.  Combination pills are also associated with a small increased risk of:  Blood clots.  Heart attack.  Stroke.  This information is not intended to replace advice given to you by your health care provider. Make sure you discuss any questions you have with your health care provider. Document Released: 12/27/2002 Document Revised: 03/13/2016 Document Reviewed: 03/27/2013 Elsevier Interactive Patient Education  [employer].

## 2018-07-15 NOTE — Progress Notes (Signed)
22 y.o. G0P0000 Single Black or African American Not Hispanic or Latino female here for annual exam.   She c/o a one month h/o intermittent BLQ abdominal pain. The pain lasts for 2-3 days at a time. Pain ~8 days out of the last month. The pain is sharp, stabbing, ripping pain. Random. She does have a diagnosis of IBS, mainly has issues with constipation. She can go a week in between BM's. She is on a stool softener and miralax, has seen GI. Cycles every 1-3 months, mostly every other month x 7 days. She can saturate a pad in 30 minutes. Heavier in the last several months. For the last several months she has been getting monthly cycles. Last hgb in 7/19 was low at 11.7. Normal TSH in 7/19. Negative STD testing in 6/19. H/O PCOS, pre-diabetes on metformin. H/O sleep apnea, on CPAP.  Not currently sexually active. No h/o pain.  She was previously on OCP's, did okay on it. Had the [place] last year, removed because it was migrating.  No issues with hirsutism or acne.  Period Cycle (Days): 28 Period Pattern: (!) Irregular Menstrual Flow: Moderate Menstrual Control: Maxi pad Menstrual Control Change Freq (Hours): every 30 min or more Dysmenorrhea: (!) Mild to severe, severe in the last 6-12 months. Sometimes she can't function. Ibuprofen doesn't really work, she takes a hot shower, tries to sleep.  No h/o STD's.  She does have a h/o BV, thinks she has it now, she does c/o an increased vaginal d/c with odor for months. Needs to wear a panty liner.   Patient's last menstrual period was 06/29/2018.          Sexually active: Yes.    The current method of family planning is condoms sometimes and most of the time.    Exercising: No.  The patient does not participate in regular exercise at present. Smoker:  no  Health Maintenance: Pap:  unsure History of abnormal Pap:  no MMG:  N/a BMD:   n/a Colonoscopy: n/a TDaP:  2010 Gardasil: 08/23/2013, 07/22/2012, 05/20/2012   reports that she has never smoked.  She has never used smokeless tobacco. She reports that she drinks alcohol. She reports that she does not use drugs. She goes to school on line, studying business administration. Works as Therapist, art for [employer].   Past Medical History:  Diagnosis Date  . Abnormal uterine bleeding   . Acid reflux   . Amenorrhea   . Anemia    no current med.  . Complication of anesthesia    states had to keep giving her anesthesia during EGD  . Constipation   . Finger mass, left 03/2017   middle finger  . Irritable bowel syndrome (IBS)   . Morbid obesity with body mass index (BMI) of 45.0 to 49.9 in adult [facility])   . Plantar fasciitis, bilateral   . Polycystic ovary syndrome   . Shortness of breath   . Sleep apnea    no CPAP use    Past Surgical History:  Procedure Laterality Date  . COLONOSCOPY WITH PROPOFOL  10/07/2016  . ESOPHAGOGASTRODUODENOSCOPY  12/21/2015  . EXCISION MASS UPPER EXTREMETIES Left 04/21/2017   Procedure: EXCISION MASS LEFT MIDDLE FINGER;  Surgeon: Daryll Brod, MD;  Location: [facility];  Service: Orthopedics;  Laterality: Left;    Current Outpatient Medications  Medication Sig Dispense Refill  . buPROPion (WELLBUTRIN SR) 150 MG 12 hr tablet Take 1 tablet (150 mg total) by mouth daily. 30 tablet 0  .  cromolyn (NASALCROM) 5.2 MG/ACT nasal spray Place 1 spray into both nostrils 4 (four) times daily. 26 mL 12  . diclofenac (VOLTAREN) 75 MG EC tablet Take 1 tablet (75 mg total) by mouth 2 (two) times daily as needed for moderate pain. 20 tablet 0  . dicyclomine (BENTYL) 20 MG tablet Take 20 mg by mouth every 6 (six) hours as needed for spasms.     . fluticasone (FLONASE) 50 MCG/ACT nasal spray Place 2 sprays into both nostrils daily. 16 g 6  . lactulose (CHRONULAC) 10 GM/15ML solution Take 15 mLs (10 g total) by mouth 2 (two) times daily. 472 mL 1  . loratadine (CLARITIN) 10 MG tablet Take 10 mg by mouth daily as needed for allergies.     . metFORMIN (GLUCOPHAGE)  500 MG tablet Take 1 tablet (500 mg total) by mouth 2 (two) times daily with a meal. 60 tablet 0  . omeprazole (PRILOSEC) 40 MG capsule TAKE ONE CAPSULE BY MOUTH  DAILY (Patient taking differently: TAKE 40 MG BY MOUTH  DAILY) 30 capsule 3  . ondansetron (ZOFRAN ODT) 8 MG disintegrating tablet Take 1 tablet (8 mg total) by mouth every 8 (eight) hours as needed for nausea or vomiting. 30 tablet 0  . polyethylene glycol (MIRALAX / GLYCOLAX) packet Take 17 g by mouth daily as needed for moderate constipation.     . topiramate (TOPAMAX) 25 MG tablet Take 1 tablet (25 mg total) by mouth 2 times daily at 12 noon and 4 pm. 60 tablet 11  . Vitamin D, Ergocalciferol, (DRISDOL) 50000 units CAPS capsule Take 1 capsule (50,000 Units total) by mouth every 7 (seven) days. 4 capsule 0   No current facility-administered medications for this visit.   Topamax is for her vertigo.   Family History  Problem Relation Age of Onset  . Diabetes Maternal Aunt   . Hypertension Maternal Uncle   . Depression Maternal Grandfather   . Diabetes Maternal Grandfather   . Hypertension Maternal Grandfather   . Arthritis Mother   . High blood pressure Mother   . Depression Mother   . Sleep apnea Mother   . Obesity Mother     Review of Systems  Constitutional: Negative.   HENT: Negative.   Eyes: Negative.   Respiratory: Negative.   Cardiovascular: Negative.   Gastrointestinal: Negative.   Endocrine: Negative.   Genitourinary: Negative.   Musculoskeletal: Negative.   Skin: Negative.   Allergic/Immunologic: Negative.   Neurological: Negative.   Hematological: Negative.   Psychiatric/Behavioral: Negative.   All other systems reviewed and are negative.   Exam:   BP 112/78   Ht 5' 11.25" (1.81 m)   Wt (!) 387 lb (175.5 kg)   LMP 06/29/2018   BMI 53.60 kg/m   Weight change: @WEIGHTCHANGE @ Height:   Height: 5' 11.25" (181 cm)  Ht Readings from Last 3 Encounters:  07/15/18 5' 11.25" (1.81 m) (>99 %, Z= 2.74)*   04/27/18 5' 11.5" (1.816 m) (>99 %, Z= 2.84)*  04/01/18 6\' 1"  (1.854 m) (>99 %, Z= 3.44)*   * Growth percentiles are based on CDC (Girls, 2-20 Years) data.    General appearance: alert, cooperative and appears stated age Head: Normocephalic, without obvious abnormality, atraumatic Neck: no adenopathy, supple, symmetrical, trachea midline and thyroid normal to inspection and palpation Lungs: clear to auscultation bilaterally Cardiovascular: regular rate and rhythm Breasts: normal appearance, no masses or tenderness  She does have increased pigmentation under both breasts, reports itching. C/w candida intertrigo Abdomen: soft,  non-tender; non distended,  no masses,  no organomegaly Extremities: extremities normal, atraumatic, no cyanosis or edema Skin: Skin color, texture, turgor normal. No rashes or lesions Lymph nodes: Cervical, supraclavicular, and axillary nodes normal. No abnormal inguinal nodes palpated Neurologic: Grossly normal   Pelvic: External genitalia:  no lesions              Urethra:  normal appearing urethra with no masses, tenderness or lesions              Bartholins and Skenes: normal                 Vagina: normal appearing vagina with normal color and discharge, no lesions              Cervix: no lesions               Bimanual Exam:  Uterus:  not appreciably enlarged, not tender              Adnexa: no mass, fullness, tenderness               Rectovaginal: Confirms               Anus:  normal sphincter tone, no lesions  Chaperone was present for exam.  A:  Well Woman with normal exam  Oligomenorrhea, h/o PCOS, recent normal TSH  Menorrhagia leading to anemia  Vaginal d/c with odor  Vit d Def, on vit d  Obesity, working with a weight loss specialist (Dr Leafy Ro). She has lost 10-15 lbs.  Intermittent lower abdominal pain, suspect from constipation. Normal pelvic exam. F/U with primary or GI  Candida intertrigo, treat with nystatin  P:   Prolactin, CBC,  Ferritin  Affirm  Discussed the option of OCP's or the mirena IUD  She would like to try the pill, no contraindication, risks reviewed  F/U for a pill check in 3 months   She is on a low dose of Topamax for vertigo, we discussed that higher doses can affect contraception efficacy.   Use condoms if sexually active.   Discussed breast self exam  Discussed calcium and vit D intake

## 2018-07-16 LAB — CBC
Hematocrit: 38.4 % (ref 34.0–46.6)
Hemoglobin: 11.9 g/dL (ref 11.1–15.9)
MCH: 23.1 pg — ABNORMAL LOW (ref 26.6–33.0)
MCHC: 31 g/dL — AB (ref 31.5–35.7)
MCV: 75 fL — ABNORMAL LOW (ref 79–97)
PLATELETS: 273 10*3/uL (ref 150–450)
RBC: 5.15 x10E6/uL (ref 3.77–5.28)
RDW: 15.2 % (ref 12.3–15.4)
WBC: 5.5 10*3/uL (ref 3.4–10.8)

## 2018-07-16 LAB — VAGINITIS/VAGINOSIS, DNA PROBE
Candida Species: NEGATIVE
Gardnerella vaginalis: POSITIVE — AB
Trichomonas vaginosis: NEGATIVE

## 2018-07-16 LAB — PROLACTIN: PROLACTIN: 15.6 ng/mL (ref 4.8–23.3)

## 2018-07-16 LAB — FERRITIN: FERRITIN: 51 ng/mL (ref 15–77)

## 2018-07-19 ENCOUNTER — Telehealth: Payer: Self-pay | Admitting: *Deleted

## 2018-07-19 MED ORDER — METRONIDAZOLE 500 MG PO TABS: 500.0000 mg | ORAL_TABLET | Freq: Two times a day (BID) | ORAL | 0 refills | Status: AC

## 2018-07-19 NOTE — Telephone Encounter (Signed)
Message left to return call to Liesl Simons at [phone].    

## 2018-07-19 NOTE — Telephone Encounter (Signed)
Patient returned call. Results reviewed with patient and she verbalized understanding. Patient request oral flagyl. Instructions on use and ETOH precautions reviewed with patient and she verbalized understanding. RX for flagyl 500mg , #14, 0RF sent to confirmed pharmacy on file.   Routing to provider and will close encounter.

## 2018-07-19 NOTE — Telephone Encounter (Signed)
-----   Message from Salvadore Dom, MD sent at 07/16/2018  3:21 PM EDT ----- Please inform the patient that her vaginitis probe was + for BV and treat with flagyl (either oral or vaginal, her choice), no ETOH while on Flagyl.  Oral: Flagyl 500 mg BID x 7 days, or Vaginal: Metrogel, 1 applicator per vagina q day x 5 days. She is not currently anemic

## 2018-08-18 ENCOUNTER — Telehealth: Payer: Self-pay | Admitting: Obstetrics and Gynecology

## 2018-08-18 NOTE — Telephone Encounter (Signed)
Spoke with patient. Started OCP 07/18/18. LMP 07/20/18. Menses has not stopped since starting OCP. Flow was light days 1-7, heavier after. Wearing overnight pad and leaking into clothes during day, wearing depends at night. Changing pad 8-10x/day. No missed or late pills. Cramps have increased with bleeding.   Flow is lighter today, "may have changed pad 5 times". Increased fatigue. Hx of anemia.   Denies SOB, weakness, dizziness, lightheadness.   Recommended OV for further evaluation, OV scheduled for 10/31 at 3pm.   ER/WH  precautions reviewed with patient for bleeding and signs of anemia. Advised will review with Dr. Talbert Nan and return call with any additional recommendations.   Dr. Talbert Nan -please review.

## 2018-08-18 NOTE — Telephone Encounter (Signed)
Patient called stating that she has going through 8-10 pads per day, and is having to wear a Depends at night because the bleeding is so heavy. Patient is experiencing "unbearable, off the top, extreme pain."

## 2018-08-18 NOTE — Telephone Encounter (Signed)
Agree with plan 

## 2018-08-19 ENCOUNTER — Ambulatory Visit (INDEPENDENT_AMBULATORY_CARE_PROVIDER_SITE_OTHER): Payer: Self-pay | Admitting: Obstetrics and Gynecology

## 2018-08-19 ENCOUNTER — Encounter: Payer: Self-pay | Admitting: Obstetrics and Gynecology

## 2018-08-19 VITALS — BP 122/68 | HR 76 | Wt 391.6 lb

## 2018-08-19 DIAGNOSIS — N939 Abnormal uterine and vaginal bleeding, unspecified: Secondary | ICD-10-CM

## 2018-08-19 LAB — POCT URINE PREGNANCY: PREG TEST UR: NEGATIVE

## 2018-08-19 NOTE — Progress Notes (Signed)
GYNECOLOGY  VISIT   HPI: 22 y.o.   Single Black or African American Not Hispanic or Latino  female    G0P0000 with Patient's last menstrual period was 07/19/2018 (exact date).   here for irregular menses. She was seen last month for an annual exam. She has a h/o oligomenorrhea, menorrhagia, severe dysmenorrhea, PCOS and pre-diabetes. She was started on OCP's, started it on 9/29. Started menses on 07/19/2018 heavily until 08/04/2018. On 08/04/2018 cycle was very light. On 08/05/2018 began bleeding heavily again. Bleeding has been heavy since then. She skipped one pill ~08/08/18, doubled up the next day. On 10/30 became very light and has not had any bleeding today. She started her second pack this past Sunday. She has finally stopped bleeding. No pain, not lightheaded or dizzy. Just feels weak.  Not sexually active, not sure if she has been active since last genprobe. Normal prolactin and CBC last month. Normal TSH in the last few months.  GYNECOLOGIC HISTORY: Patient's last menstrual period was 07/19/2018 (exact date).  Contraception:Condoms and OCP Menopausal hormone therapy: None        OB History    Gravida  0   Para  0   Term  0   Preterm  0   AB  0   Living  0     SAB  0   TAB  0   Ectopic  0   Multiple  0   Live Births  0              Patient Active Problem List   Diagnosis Date Noted  . Vitamin D deficiency 09/16/2017  . Prediabetes 09/16/2017  . Depression 09/16/2017  . Class 3 severe obesity with serious comorbidity and body mass index (BMI) of 50.0 to 59.9 in adult ([place]) 09/16/2017  . Shortness of breath on exertion 08/13/2017  . PCOS (polycystic ovarian syndrome) 08/13/2017  . Mass 05/06/2017  . Vertigo 10/06/2016  . Anemia 09/26/2016  . Pseudotumor cerebri syndrome 08/28/2016  . Obesity hypoventilation syndrome ([place]) 08/28/2016  . Benign paroxysmal positional vertigo due to bilateral vestibular disorder 08/28/2016  . OSA (obstructive sleep apnea)  08/28/2016  . Hypersomnia with sleep apnea 08/28/2016  . Female hirsutism 08/28/2016  . Back pain 04/16/2015  . Amenorrhea 04/13/2015  . Sexually active at young age 51/04/2015  . GERD (gastroesophageal reflux disease) 02/24/2015  . Constipation 10/02/2014  . Shoulder pain, left 01/29/2014    Past Medical History:  Diagnosis Date  . Abnormal uterine bleeding   . Acid reflux   . Amenorrhea   . Anemia    no current med.  . Complication of anesthesia    states had to keep giving her anesthesia during EGD  . Constipation   . Finger mass, left 03/2017   middle finger  . Irritable bowel syndrome (IBS)   . Morbid obesity with body mass index (BMI) of 45.0 to 49.9 in adult [facility])   . Plantar fasciitis, bilateral   . Polycystic ovary syndrome   . Shortness of breath   . Sleep apnea    no CPAP use    Past Surgical History:  Procedure Laterality Date  . COLONOSCOPY WITH PROPOFOL  10/07/2016  . ESOPHAGOGASTRODUODENOSCOPY  12/21/2015  . EXCISION MASS UPPER EXTREMETIES Left 04/21/2017   Procedure: EXCISION MASS LEFT MIDDLE FINGER;  Surgeon: Daryll Brod, MD;  Location: [facility];  Service: Orthopedics;  Laterality: Left;    Current Outpatient Medications  Medication Sig Dispense Refill  .  buPROPion (WELLBUTRIN SR) 150 MG 12 hr tablet Take 1 tablet (150 mg total) by mouth daily. 30 tablet 0  . cromolyn (NASALCROM) 5.2 MG/ACT nasal spray Place 1 spray into both nostrils 4 (four) times daily. 26 mL 12  . diclofenac (VOLTAREN) 75 MG EC tablet Take 1 tablet (75 mg total) by mouth 2 (two) times daily as needed for moderate pain. 20 tablet 0  . fluticasone (FLONASE) 50 MCG/ACT nasal spray Place 2 sprays into both nostrils daily. 16 g 6  . ibuprofen (ADVIL,MOTRIN) 800 MG tablet Take 1 tablet (800 mg total) by mouth every 8 (eight) hours as needed. 30 tablet 1  . lactulose (CHRONULAC) 10 GM/15ML solution Take 15 mLs (10 g total) by mouth 2 (two) times daily. 472 mL 1  .  loratadine (CLARITIN) 10 MG tablet Take 10 mg by mouth daily as needed for allergies.     . metFORMIN (GLUCOPHAGE) 500 MG tablet Take 1 tablet (500 mg total) by mouth 2 (two) times daily with a meal. 60 tablet 0  . norethindrone-ethinyl estradiol (JUNEL FE,GILDESS FE,LOESTRIN FE) 1-20 MG-MCG tablet Take 1 tablet by mouth daily. 3 Package 0  . nystatin cream (MYCOSTATIN) Apply 1 application topically 2 (two) times daily. Apply to affected area BID for up to 7 days. 30 g 0  . omeprazole (PRILOSEC) 40 MG capsule TAKE ONE CAPSULE BY MOUTH  DAILY (Patient taking differently: TAKE 40 MG BY MOUTH  DAILY) 30 capsule 3  . ondansetron (ZOFRAN ODT) 8 MG disintegrating tablet Take 1 tablet (8 mg total) by mouth every 8 (eight) hours as needed for nausea or vomiting. 30 tablet 0  . polyethylene glycol (MIRALAX / GLYCOLAX) packet Take 17 g by mouth daily as needed for moderate constipation.     . topiramate (TOPAMAX) 25 MG tablet Take 1 tablet (25 mg total) by mouth 2 times daily at 12 noon and 4 pm. 60 tablet 11  . Vitamin D, Ergocalciferol, (DRISDOL) 50000 units CAPS capsule Take 1 capsule (50,000 Units total) by mouth every 7 (seven) days. 4 capsule 0  . dicyclomine (BENTYL) 20 MG tablet Take 20 mg by mouth every 6 (six) hours as needed for spasms.      No current facility-administered medications for this visit.      ALLERGIES: Bee pollen; Hydrocodone-acetaminophen; Peach flavor; and Pollen extract  Family History  Problem Relation Age of Onset  . Diabetes Maternal Aunt   . Hypertension Maternal Uncle   . Depression Maternal Grandfather   . Diabetes Maternal Grandfather   . Hypertension Maternal Grandfather   . Arthritis Mother   . High blood pressure Mother   . Depression Mother   . Sleep apnea Mother   . Obesity Mother     Social History   Socioeconomic History  . Marital status: Single    Spouse name: Not on file  . Number of children: Not on file  . Years of education: Not on file  .  Highest education level: Not on file  Occupational History  . Occupation: Chemical engineer: [employer]  . Financial resource strain: Not on file  . Food insecurity:    Worry: Not on file    Inability: Not on file  . Transportation needs:    Medical: Not on file    Non-medical: Not on file  Tobacco Use  . Smoking status: Never Smoker  . Smokeless tobacco: Never Used  Substance and Sexual Activity  . Alcohol use:  Yes    Comment: social   . Drug use: No  . Sexual activity: Yes    Birth control/protection: Condom  Lifestyle  . Physical activity:    Days per week: Not on file    Minutes per session: Not on file  . Stress: Not on file  Relationships  . Social connections:    Talks on phone: Not on file    Gets together: Not on file    Attends religious service: Not on file    Active member of club or organization: Not on file    Attends meetings of clubs or organizations: Not on file    Relationship status: Not on file  . Intimate partner violence:    Fear of current or ex partner: Not on file    Emotionally abused: Not on file    Physically abused: Not on file    Forced sexual activity: Not on file  Other Topics Concern  . Not on file  Social History Narrative   Lives with mom and cousin, a grandmother in [place] also helps care for her.       She is in nursing school.     Review of Systems  Constitutional: Positive for chills.  HENT: Negative.   Eyes: Negative.   Respiratory: Negative.   Cardiovascular: Positive for chest pain.  Gastrointestinal: Positive for abdominal pain and nausea.       Bloating  Genitourinary: Positive for frequency.       Excessive bleeding Painful menses  Musculoskeletal: Negative.   Skin: Negative.   Neurological: Negative.   Endo/Heme/Allergies:       Craving sweats Excessive thirst    PHYSICAL EXAMINATION:    BP 122/68 (BP Location: Right Arm, Patient Position: Sitting, Cuff Size: Large)   Pulse 76    Wt (!) 391 lb 9.6 oz (177.6 kg)   LMP 07/19/2018 (Exact Date)   BMI 54.23 kg/m     General appearance: alert, cooperative and appears stated age Neck: no adenopathy, supple, symmetrical, trachea midline and thyroid normal to inspection and palpation Abdomen: soft, non-tender; non distended, no masses,  no organomegaly  Pelvic: External genitalia:  no lesions              Urethra:  normal appearing urethra with no masses, tenderness or lesions              Bartholins and Skenes: normal                 Vagina: normal appearing vagina with normal color and discharge, no lesions              Cervix: no cervical motion tenderness and no lesions              Bimanual Exam:  Uterus:  normal size, contour, position, consistency, mobility, non-tender and anteverted              Adnexa: no mass, fullness, tenderness               Chaperone was present for exam.  ASSESSMENT AUB since starting OCP's last month, h/o oligomenorrhea and menorrhagia. Normal exam    PLAN UPT negative Recent normal TSH, prolactin CBC, ferritin Continue OCP's, bleeding has currently stopped, she is on her second pack of pills Take pills at the same time daily F/u for a pill check in 2 months   An After Visit Summary was printed and given to the patient.  ~15 minutes face to face time  of which over 50% was spent in counseling.

## 2018-08-20 ENCOUNTER — Ambulatory Visit: Payer: Self-pay | Admitting: Adult Health

## 2018-08-20 LAB — CBC
HEMOGLOBIN: 11.8 g/dL (ref 11.1–15.9)
Hematocrit: 36.9 % (ref 34.0–46.6)
MCH: 22.8 pg — ABNORMAL LOW (ref 26.6–33.0)
MCHC: 32 g/dL (ref 31.5–35.7)
MCV: 71 fL — ABNORMAL LOW (ref 79–97)
PLATELETS: 312 10*3/uL (ref 150–450)
RBC: 5.18 x10E6/uL (ref 3.77–5.28)
RDW: 14.4 % (ref 12.3–15.4)
WBC: 6.7 10*3/uL (ref 3.4–10.8)

## 2018-08-20 LAB — FERRITIN: Ferritin: 40 ng/mL (ref 15–150)

## 2018-08-21 LAB — GC/CHLAMYDIA PROBE AMP
Chlamydia trachomatis, NAA: NEGATIVE
NEISSERIA GONORRHOEAE BY PCR: NEGATIVE

## 2018-10-02 ENCOUNTER — Encounter (HOSPITAL_COMMUNITY): Payer: Self-pay | Admitting: Emergency Medicine

## 2018-10-02 ENCOUNTER — Ambulatory Visit (HOSPITAL_COMMUNITY)
Admission: EM | Admit: 2018-10-02 | Discharge: 2018-10-02 | Disposition: A | Discharge status: Home / Self Care | Payer: Medicaid Other | Attending: Family Medicine | Admitting: Family Medicine

## 2018-10-02 DIAGNOSIS — J069 Acute upper respiratory infection, unspecified: Secondary | ICD-10-CM

## 2018-10-02 MED ORDER — BENZONATATE 100 MG PO CAPS: 100.0000 mg | ORAL_CAPSULE | Freq: Three times a day (TID) | ORAL | 0 refills | Status: DC | PRN

## 2018-10-02 MED ORDER — AMOXICILLIN 500 MG PO CAPS: 500.0000 mg | ORAL_CAPSULE | Freq: Two times a day (BID) | ORAL | 0 refills | Status: AC

## 2018-10-02 NOTE — ED Triage Notes (Signed)
Pt c/o URI symptoms x1.5 weeks, pt worried about the flu since she was exposed last week.

## 2018-10-02 NOTE — ED Provider Notes (Signed)
[place]    CSN: [id number] Arrival date & time: 10/02/18  1734     History   Chief Complaint Chief Complaint  Patient presents with  . Cough    HPI Maureen Lewis is a 22 y.o. female.   HPI  Patient complains of symptoms of chest tightness, productive cough and persistent clear nasal drainage occurring for over 7 days.  She reports no known history of asthma although has been prescribed an asthma inhaler in the past and inconsistently takes antihistamines for allergic rhinitis type symptoms.  She reports a productive cough with some chest tightness which is been ongoing over the course of illness.  She denies fever although has endorsing hot and cold chills.  Sick contact is infant child.  She has been taking Sudafed and taking over-the-counter cough suppressant since illness developed.  She denies any current shortness of breath or wheezing.  Symptoms seem to worsen during the nighttime.  Past Medical History:  Diagnosis Date  . Abnormal uterine bleeding   . Acid reflux   . Amenorrhea   . Anemia    no current med.  . Complication of anesthesia    states had to keep giving her anesthesia during EGD  . Constipation   . Finger mass, left 03/2017   middle finger  . Irritable bowel syndrome (IBS)   . Morbid obesity with body mass index (BMI) of 45.0 to 49.9 in adult [facility])   . Plantar fasciitis, bilateral   . Polycystic ovary syndrome   . Shortness of breath   . Sleep apnea    no CPAP use    Patient Active Problem List   Diagnosis Date Noted  . Vitamin D deficiency 09/16/2017  . Prediabetes 09/16/2017  . Depression 09/16/2017  . Class 3 severe obesity with serious comorbidity and body mass index (BMI) of 50.0 to 59.9 in adult ([place]) 09/16/2017  . Shortness of breath on exertion 08/13/2017  . PCOS (polycystic ovarian syndrome) 08/13/2017  . Mass 05/06/2017  . Vertigo 10/06/2016  . Anemia 09/26/2016  . Pseudotumor cerebri syndrome 08/28/2016  . Obesity  hypoventilation syndrome ([place]) 08/28/2016  . Benign paroxysmal positional vertigo due to bilateral vestibular disorder 08/28/2016  . OSA (obstructive sleep apnea) 08/28/2016  . Hypersomnia with sleep apnea 08/28/2016  . Female hirsutism 08/28/2016  . Back pain 04/16/2015  . Amenorrhea 04/13/2015  . Sexually active at young age 41/04/2015  . GERD (gastroesophageal reflux disease) 02/24/2015  . Constipation 10/02/2014  . Shoulder pain, left 01/29/2014    Past Surgical History:  Procedure Laterality Date  . COLONOSCOPY WITH PROPOFOL  10/07/2016  . ESOPHAGOGASTRODUODENOSCOPY  12/21/2015  . EXCISION MASS UPPER EXTREMETIES Left 04/21/2017   Procedure: EXCISION MASS LEFT MIDDLE FINGER;  Surgeon: Daryll Brod, MD;  Location: [place];  Service: Orthopedics;  Laterality: Left;    OB History    Gravida  0   Para  0   Term  0   Preterm  0   AB  0   Living  0     SAB  0   TAB  0   Ectopic  0   Multiple  0   Live Births  0            Home Medications    Prior to Admission medications   Medication Sig Start Date End Date Taking? Authorizing Provider  buPROPion (WELLBUTRIN SR) 150 MG 12 hr tablet Take 1 tablet (150 mg total) by mouth daily. 04/01/18  Yes Dennard Nip D, MD  cromolyn (NASALCROM) 5.2 MG/ACT nasal spray Place 1 spray into both nostrils 4 (four) times daily. 11/17/17  Yes Augusto Gamble B, NP  diclofenac (VOLTAREN) 75 MG EC tablet Take 1 tablet (75 mg total) by mouth 2 (two) times daily as needed for moderate pain. 03/03/17  Yes Amyot, Nicholes Stairs, NP  dicyclomine (BENTYL) 20 MG tablet Take 20 mg by mouth every 6 (six) hours as needed for spasms.    Yes [provider]  fluticasone (FLONASE) 50 MCG/ACT nasal spray Place 2 sprays into both nostrils daily. 04/12/18  Yes Nafziger, Tommi Rumps, NP  ibuprofen (ADVIL,MOTRIN) 800 MG tablet Take 1 tablet (800 mg total) by mouth every 8 (eight) hours as needed. 07/15/18  Yes Salvadore Dom, MD    loratadine (CLARITIN) 10 MG tablet Take 10 mg by mouth daily as needed for allergies.    Yes [provider]  metFORMIN (GLUCOPHAGE) 500 MG tablet Take 1 tablet (500 mg total) by mouth 2 (two) times daily with a meal. 04/01/18  Yes Beasley, Caren D, MD  norethindrone-ethinyl estradiol (JUNEL FE,GILDESS FE,LOESTRIN FE) 1-20 MG-MCG tablet Take 1 tablet by mouth daily. 07/15/18  Yes Salvadore Dom, MD  nystatin cream (MYCOSTATIN) Apply 1 application topically 2 (two) times daily. Apply to affected area BID for up to 7 days. 07/15/18  Yes Salvadore Dom, MD  omeprazole (PRILOSEC) 40 MG capsule TAKE ONE CAPSULE BY MOUTH  DAILY Patient taking differently: TAKE 40 MG BY MOUTH  DAILY 10/16/15  Yes Luiz Blare Y, DO  ondansetron (ZOFRAN ODT) 8 MG disintegrating tablet Take 1 tablet (8 mg total) by mouth every 8 (eight) hours as needed for nausea or vomiting. 11/17/17  Yes Burky, Lanelle Bal B, NP  polyethylene glycol (MIRALAX / GLYCOLAX) packet Take 17 g by mouth daily as needed for moderate constipation.    Yes [provider]  topiramate (TOPAMAX) 25 MG tablet Take 1 tablet (25 mg total) by mouth 2 times daily at 12 noon and 4 pm. 07/01/17  Yes Dohmeier, Asencion Partridge, MD  Vitamin D, Ergocalciferol, (DRISDOL) 50000 units CAPS capsule Take 1 capsule (50,000 Units total) by mouth every 7 (seven) days. 04/01/18  Yes Beasley, Caren D, MD  lactulose (CHRONULAC) 10 GM/15ML solution Take 15 mLs (10 g total) by mouth 2 (two) times daily. Patient not taking: Reported on 10/02/2018 04/27/18   Dorothyann Peng, NP    Family History Family History  Problem Relation Age of Onset  . Diabetes Maternal Aunt   . Hypertension Maternal Uncle   . Depression Maternal Grandfather   . Diabetes Maternal Grandfather   . Hypertension Maternal Grandfather   . Arthritis Mother   . High blood pressure Mother   . Depression Mother   . Sleep apnea Mother   . Obesity Mother     Social History Social History    Tobacco Use  . Smoking status: Never Smoker  . Smokeless tobacco: Never Used  Substance Use Topics  . Alcohol use: Yes    Comment: social   . Drug use: No     Allergies   Bee pollen; Hydrocodone-acetaminophen; Peach flavor; and Pollen extract   Review of Systems Review of Systems Pertinent negatives listed in HPI  Physical Exam Triage Vital Signs ED Triage Vitals [10/02/18 1816]  Enc Vitals Group     BP (!) 153/75     Pulse      Resp 16     Temp 98 F (36.7 C)  Temp src      SpO2 98 %     Weight      Height      Head Circumference      Peak Flow      Pain Score      Pain Loc      Pain Edu?      Excl. in [place]?    No data found.  Updated Vital Signs BP (!) 153/75   Temp 98 F (36.7 C)   Resp 16   SpO2 98%   Visual Acuity Right Eye Distance:   Left Eye Distance:   Bilateral Distance:    Right Eye Near:   Left Eye Near:    Bilateral Near:     Physical Exam General Appearance:    Alert, cooperative, no distress  HENT:   bilateral TM normal without fluid or infection, neck without nodes, throat normal without erythema or exudate, sinuses nontender, post nasal drip noted and nasal mucosa congested  Eyes:    PERRL, conjunctiva/corneas clear, EOM's intact       Lungs:    diminished lung sounds likely secondary to morbid obesity.  Negative wheezes and rales.  Respirations unlabored.    Heart:    Regular rate and rhythm  Neurologic:   Awake, alert, oriented x 3. No apparent focal neurological           defect.    UC Treatments / Results  Labs (all labs ordered are listed, but only abnormal results are displayed) Labs Reviewed - No data to display  EKG None  Radiology No results found.  Procedures Procedures (including critical care time)  Medications Ordered in UC Medications - No data to display  Initial Impression / Assessment and Plan / UC Course  I have reviewed the triage vital signs and the nursing notes.  Pertinent labs & imaging  results that were available during my care of the patient were reviewed by me and considered in my medical decision making (see chart for details).    Advise patient the symptoms are most likely viral.  I would give symptoms a few days to resolve independently however if symptoms worsen I have sent over amoxicillin 500 mg twice daily x10 days for treatment of URI.  Encouraged her to take benzonatate and stop taking over-the-counter suppressants as her blood pressure is elevated today.  Patient verbalized understanding and agreement with plan.  Advised if symptoms worsen or do not improve to follow-up with PCP or return to urgent care.  Final Clinical Impressions(s) / UC Diagnoses   Final diagnoses:  Viral upper respiratory tract infection   Discharge Instructions   None    ED Prescriptions    Medication Sig Dispense Auth. Provider   amoxicillin (AMOXIL) 500 MG capsule Take 1 capsule (500 mg total) by mouth 2 (two) times daily for 10 days. 20 capsule Scot Jun, FNP   benzonatate (TESSALON) 100 MG capsule Take 1-2 capsules (100-200 mg total) by mouth 3 (three) times daily as needed for cough. 60 capsule Scot Jun, FNP     Controlled Substance Prescriptions [place] Controlled Substance Registry consulted? Not Applicable   Scot Jun, [place] 10/07/18 [phone]

## 2018-10-06 NOTE — Progress Notes (Deleted)
GYNECOLOGY  VISIT   HPI: 22 y.o.   Single Black or African American Not Hispanic or Latino  female   [id number] with No LMP recorded. (Menstrual status: Irregular Periods).   here for     GYNECOLOGIC HISTORY: No LMP recorded. (Menstrual status: Irregular Periods). Contraception:*** Menopausal hormone therapy: ***        OB History    Gravida  0   Para  0   Term  0   Preterm  0   AB  0   Living  0     SAB  0   TAB  0   Ectopic  0   Multiple  0   Live Births  0              Patient Active Problem List   Diagnosis Date Noted  . Vitamin D deficiency 09/16/2017  . Prediabetes 09/16/2017  . Depression 09/16/2017  . Class 3 severe obesity with serious comorbidity and body mass index (BMI) of 50.0 to 59.9 in adult ([place]) 09/16/2017  . Shortness of breath on exertion 08/13/2017  . PCOS (polycystic ovarian syndrome) 08/13/2017  . Mass 05/06/2017  . Vertigo 10/06/2016  . Anemia 09/26/2016  . Pseudotumor cerebri syndrome 08/28/2016  . Obesity hypoventilation syndrome ([place]) 08/28/2016  . Benign paroxysmal positional vertigo due to bilateral vestibular disorder 08/28/2016  . OSA (obstructive sleep apnea) 08/28/2016  . Hypersomnia with sleep apnea 08/28/2016  . Female hirsutism 08/28/2016  . Back pain 04/16/2015  . Amenorrhea 04/13/2015  . Sexually active at young age 60/04/2015  . GERD (gastroesophageal reflux disease) 02/24/2015  . Constipation 10/02/2014  . Shoulder pain, left 01/29/2014    Past Medical History:  Diagnosis Date  . Abnormal uterine bleeding   . Acid reflux   . Amenorrhea   . Anemia    no current med.  . Complication of anesthesia    states had to keep giving her anesthesia during EGD  . Constipation   . Finger mass, left 03/2017   middle finger  . Irritable bowel syndrome (IBS)   . Morbid obesity with body mass index (BMI) of 45.0 to 49.9 in adult [facility])   . Plantar fasciitis, bilateral   . Polycystic ovary syndrome   . Shortness of  breath   . Sleep apnea    no CPAP use    Past Surgical History:  Procedure Laterality Date  . COLONOSCOPY WITH PROPOFOL  10/07/2016  . ESOPHAGOGASTRODUODENOSCOPY  12/21/2015  . EXCISION MASS UPPER EXTREMETIES Left 04/21/2017   Procedure: EXCISION MASS LEFT MIDDLE FINGER;  Surgeon: Daryll Brod, MD;  Location: [place];  Service: Orthopedics;  Laterality: Left;    Current Outpatient Medications  Medication Sig Dispense Refill  . amoxicillin (AMOXIL) 500 MG capsule Take 1 capsule (500 mg total) by mouth 2 (two) times daily for 10 days. 20 capsule 0  . benzonatate (TESSALON) 100 MG capsule Take 1-2 capsules (100-200 mg total) by mouth 3 (three) times daily as needed for cough. 60 capsule 0  . buPROPion (WELLBUTRIN SR) 150 MG 12 hr tablet Take 1 tablet (150 mg total) by mouth daily. 30 tablet 0  . cromolyn (NASALCROM) 5.2 MG/ACT nasal spray Place 1 spray into both nostrils 4 (four) times daily. 26 mL 12  . diclofenac (VOLTAREN) 75 MG EC tablet Take 1 tablet (75 mg total) by mouth 2 (two) times daily as needed for moderate pain. 20 tablet 0  . dicyclomine (BENTYL) 20 MG tablet Take 20 mg  by mouth every 6 (six) hours as needed for spasms.     . fluticasone (FLONASE) 50 MCG/ACT nasal spray Place 2 sprays into both nostrils daily. 16 g 6  . ibuprofen (ADVIL,MOTRIN) 800 MG tablet Take 1 tablet (800 mg total) by mouth every 8 (eight) hours as needed. 30 tablet 1  . lactulose (CHRONULAC) 10 GM/15ML solution Take 15 mLs (10 g total) by mouth 2 (two) times daily. (Patient not taking: Reported on 10/02/2018) 472 mL 1  . loratadine (CLARITIN) 10 MG tablet Take 10 mg by mouth daily as needed for allergies.     . metFORMIN (GLUCOPHAGE) 500 MG tablet Take 1 tablet (500 mg total) by mouth 2 (two) times daily with a meal. 60 tablet 0  . norethindrone-ethinyl estradiol (JUNEL FE,GILDESS FE,LOESTRIN FE) 1-20 MG-MCG tablet Take 1 tablet by mouth daily. 3 Package 0  . nystatin cream (MYCOSTATIN)  Apply 1 application topically 2 (two) times daily. Apply to affected area BID for up to 7 days. 30 g 0  . omeprazole (PRILOSEC) 40 MG capsule TAKE ONE CAPSULE BY MOUTH  DAILY (Patient taking differently: TAKE 40 MG BY MOUTH  DAILY) 30 capsule 3  . ondansetron (ZOFRAN ODT) 8 MG disintegrating tablet Take 1 tablet (8 mg total) by mouth every 8 (eight) hours as needed for nausea or vomiting. 30 tablet 0  . polyethylene glycol (MIRALAX / GLYCOLAX) packet Take 17 g by mouth daily as needed for moderate constipation.     . topiramate (TOPAMAX) 25 MG tablet Take 1 tablet (25 mg total) by mouth 2 times daily at 12 noon and 4 pm. 60 tablet 11  . Vitamin D, Ergocalciferol, (DRISDOL) 50000 units CAPS capsule Take 1 capsule (50,000 Units total) by mouth every 7 (seven) days. 4 capsule 0   No current facility-administered medications for this visit.      ALLERGIES: Bee pollen; Hydrocodone-acetaminophen; Peach flavor; and Pollen extract  Family History  Problem Relation Age of Onset  . Diabetes Maternal Aunt   . Hypertension Maternal Uncle   . Depression Maternal Grandfather   . Diabetes Maternal Grandfather   . Hypertension Maternal Grandfather   . Arthritis Mother   . High blood pressure Mother   . Depression Mother   . Sleep apnea Mother   . Obesity Mother     Social History   Socioeconomic History  . Marital status: Single    Spouse name: Not on file  . Number of children: Not on file  . Years of education: Not on file  . Highest education level: Not on file  Occupational History  . Occupation: Chemical engineer: [employer]  . Financial resource strain: Not on file  . Food insecurity:    Worry: Not on file    Inability: Not on file  . Transportation needs:    Medical: Not on file    Non-medical: Not on file  Tobacco Use  . Smoking status: Never Smoker  . Smokeless tobacco: Never Used  Substance and Sexual Activity  . Alcohol use: Yes    Comment: social    . Drug use: No  . Sexual activity: Yes    Birth control/protection: Condom  Lifestyle  . Physical activity:    Days per week: Not on file    Minutes per session: Not on file  . Stress: Not on file  Relationships  . Social connections:    Talks on phone: Not on file    Gets together:  Not on file    Attends religious service: Not on file    Active member of club or organization: Not on file    Attends meetings of clubs or organizations: Not on file    Relationship status: Not on file  . Intimate partner violence:    Fear of current or ex partner: Not on file    Emotionally abused: Not on file    Physically abused: Not on file    Forced sexual activity: Not on file  Other Topics Concern  . Not on file  Social History Narrative   Lives with mom and cousin, a grandmother in [place] also helps care for her.       She is in nursing school.     ROS  PHYSICAL EXAMINATION:    There were no vitals taken for this visit.    General appearance: alert, cooperative and appears stated age Neck: no adenopathy, supple, symmetrical, trachea midline and thyroid {CHL AMB PHY EX THYROID NORM DEFAULT:[phone]::"normal to inspection and palpation"} Breasts: {Exam; breast:13139::"normal appearance, no masses or tenderness"} Abdomen: soft, non-tender; non distended, no masses,  no organomegaly  Pelvic: External genitalia:  no lesions              Urethra:  normal appearing urethra with no masses, tenderness or lesions              Bartholins and Skenes: normal                 Vagina: normal appearing vagina with normal color and discharge, no lesions              Cervix: {CHL AMB PHY EX CERVIX NORM DEFAULT:[phone]::"no lesions"}              Bimanual Exam:  Uterus:  {CHL AMB PHY EX UTERUS NORM DEFAULT:[phone]::"normal size, contour, position, consistency, mobility, non-tender"}              Adnexa: {CHL AMB PHY EX ADNEXA NO MASS DEFAULT:[phone]::"no mass, fullness, tenderness"}               Rectovaginal: {yes no:[id number]}.  Confirms.              Anus:  normal sphincter tone, no lesions  Chaperone was present for exam.  ASSESSMENT     PLAN    An After Visit Summary was printed and given to the patient.  *** minutes face to face time of which over 50% was spent in counseling.

## 2018-10-07 ENCOUNTER — Encounter: Payer: Self-pay | Admitting: Obstetrics and Gynecology

## 2018-10-07 ENCOUNTER — Ambulatory Visit: Payer: BLUE CROSS/BLUE SHIELD | Admitting: Obstetrics and Gynecology

## 2018-10-07 ENCOUNTER — Telehealth: Payer: Self-pay | Admitting: Obstetrics and Gynecology

## 2018-10-07 NOTE — Telephone Encounter (Signed)
Patient [facility] her 3 month follow up appointment today. I left her a message to call and reschedule . [facility] policy followed.

## 2019-02-16 ENCOUNTER — Encounter: Payer: Self-pay | Admitting: Adult Health

## 2019-02-17 ENCOUNTER — Ambulatory Visit (INDEPENDENT_AMBULATORY_CARE_PROVIDER_SITE_OTHER): Payer: BLUE CROSS/BLUE SHIELD | Admitting: Adult Health

## 2019-02-17 ENCOUNTER — Other Ambulatory Visit: Payer: Self-pay

## 2019-02-17 DIAGNOSIS — N939 Abnormal uterine and vaginal bleeding, unspecified: Secondary | ICD-10-CM | POA: Diagnosis not present

## 2019-02-17 NOTE — Progress Notes (Signed)
Virtual Visit via Video Note  I connected with Maureen Lewis on 02/17/19 at  1:30 PM EDT by a video enabled telemedicine application and verified that I am speaking with the correct person using two identifiers.  Location patient: home Location provider:work or home office Persons participating in the virtual visit: patient, provider  I discussed the limitations of evaluation and management by telemedicine and the availability of in person appointments. The patient expressed understanding and agreed to proceed.   HPI:  23 year old female who is being evaluated today for abnormal uterine bleeding.  Reports 1 month ago her menstrual cycle came on for approximately 7 days.  Cycle was finished a few days later she had another menstrual cycle for 7 days, now she is having intermittent episodes of spotting some days heavy and some days light.  She also reports intermittent episodes of abdominal cramping that started about a week and a half ago as well as some nausea.  She is currently taking oral birth control and has not skipped any doses in the last month.  She is also never this happen to her in the past.  She did have unprotected sex approximately 2 weeks ago but has not had any since.  She does see Dr. Talbert Nan as her GYN but has not reached out to her yet.    ROS: See pertinent positives and negatives per HPI.  Past Medical History:  Diagnosis Date  . Abnormal uterine bleeding   . Acid reflux   . Amenorrhea   . Anemia    no current med.  . Complication of anesthesia    states had to keep giving her anesthesia during EGD  . Constipation   . Finger mass, left 03/2017   middle finger  . Irritable bowel syndrome (IBS)   . Morbid obesity with body mass index (BMI) of 45.0 to 49.9 in adult [facility])   . Plantar fasciitis, bilateral   . Polycystic ovary syndrome   . Shortness of breath   . Sleep apnea    no CPAP use    Past Surgical History:  Procedure Laterality Date  . COLONOSCOPY  WITH PROPOFOL  10/07/2016  . ESOPHAGOGASTRODUODENOSCOPY  12/21/2015  . EXCISION MASS UPPER EXTREMETIES Left 04/21/2017   Procedure: EXCISION MASS LEFT MIDDLE FINGER;  Surgeon: Daryll Brod, MD;  Location: [place];  Service: Orthopedics;  Laterality: Left;    Family History  Problem Relation Age of Onset  . Diabetes Maternal Aunt   . Hypertension Maternal Uncle   . Depression Maternal Grandfather   . Diabetes Maternal Grandfather   . Hypertension Maternal Grandfather   . Arthritis Mother   . High blood pressure Mother   . Depression Mother   . Sleep apnea Mother   . Obesity Mother      Current Outpatient Medications:  .  benzonatate (TESSALON) 100 MG capsule, Take 1-2 capsules (100-200 mg total) by mouth 3 (three) times daily as needed for cough., Disp: 60 capsule, Rfl: 0 .  buPROPion (WELLBUTRIN SR) 150 MG 12 hr tablet, Take 1 tablet (150 mg total) by mouth daily., Disp: 30 tablet, Rfl: 0 .  cromolyn (NASALCROM) 5.2 MG/ACT nasal spray, Place 1 spray into both nostrils 4 (four) times daily., Disp: 26 mL, Rfl: 12 .  diclofenac (VOLTAREN) 75 MG EC tablet, Take 1 tablet (75 mg total) by mouth 2 (two) times daily as needed for moderate pain., Disp: 20 tablet, Rfl: 0 .  dicyclomine (BENTYL) 20 MG tablet, Take 20 mg by  mouth every 6 (six) hours as needed for spasms. , Disp: , Rfl:  .  fluticasone (FLONASE) 50 MCG/ACT nasal spray, Place 2 sprays into both nostrils daily., Disp: 16 g, Rfl: 6 .  ibuprofen (ADVIL,MOTRIN) 800 MG tablet, Take 1 tablet (800 mg total) by mouth every 8 (eight) hours as needed., Disp: 30 tablet, Rfl: 1 .  lactulose (CHRONULAC) 10 GM/15ML solution, Take 15 mLs (10 g total) by mouth 2 (two) times daily. (Patient not taking: Reported on 10/02/2018), Disp: 472 mL, Rfl: 1 .  loratadine (CLARITIN) 10 MG tablet, Take 10 mg by mouth daily as needed for allergies. , Disp: , Rfl:  .  metFORMIN (GLUCOPHAGE) 500 MG tablet, Take 1 tablet (500 mg total) by mouth 2  (two) times daily with a meal., Disp: 60 tablet, Rfl: 0 .  norethindrone-ethinyl estradiol (JUNEL FE,GILDESS FE,LOESTRIN FE) 1-20 MG-MCG tablet, Take 1 tablet by mouth daily., Disp: 3 Package, Rfl: 0 .  nystatin cream (MYCOSTATIN), Apply 1 application topically 2 (two) times daily. Apply to affected area BID for up to 7 days., Disp: 30 g, Rfl: 0 .  omeprazole (PRILOSEC) 40 MG capsule, TAKE ONE CAPSULE BY MOUTH  DAILY (Patient taking differently: TAKE 40 MG BY MOUTH  DAILY), Disp: 30 capsule, Rfl: 3 .  ondansetron (ZOFRAN ODT) 8 MG disintegrating tablet, Take 1 tablet (8 mg total) by mouth every 8 (eight) hours as needed for nausea or vomiting., Disp: 30 tablet, Rfl: 0 .  polyethylene glycol (MIRALAX / GLYCOLAX) packet, Take 17 g by mouth daily as needed for moderate constipation. , Disp: , Rfl:  .  topiramate (TOPAMAX) 25 MG tablet, Take 1 tablet (25 mg total) by mouth 2 times daily at 12 noon and 4 pm., Disp: 60 tablet, Rfl: 11 .  Vitamin D, Ergocalciferol, (DRISDOL) 50000 units CAPS capsule, Take 1 capsule (50,000 Units total) by mouth every 7 (seven) days., Disp: 4 capsule, Rfl: 0  EXAM:  VITALS per patient if applicable:  GENERAL: alert, oriented, appears well and in no acute distress  HEENT: atraumatic, conjunttiva clear, no obvious abnormalities on inspection of external nose and ears  NECK: normal movements of the head and neck  LUNGS: on inspection no signs of respiratory distress, breathing rate appears normal, no obvious gross SOB, gasping or wheezing  CV: no obvious cyanosis  MS: moves all visible extremities without noticeable abnormality  PSYCH/NEURO: pleasant and cooperative, no obvious depression or anxiety, speech and thought processing grossly intact  ASSESSMENT AND PLAN:  Discussed the following assessment and plan:  Abnormal uterine bleeding -Advised to follow-up with GYN for further evaluation.    I discussed the assessment and treatment plan with the patient.  The patient was provided an opportunity to ask questions and all were answered. The patient agreed with the plan and demonstrated an understanding of the instructions.   The patient was advised to call back or seek an in-person evaluation if the symptoms worsen or if the condition fails to improve as anticipated.   Dorothyann Peng, NP

## 2019-02-18 ENCOUNTER — Encounter (HOSPITAL_COMMUNITY): Payer: Self-pay | Admitting: Emergency Medicine

## 2019-02-18 ENCOUNTER — Emergency Department (HOSPITAL_COMMUNITY): Payer: BLUE CROSS/BLUE SHIELD

## 2019-02-18 ENCOUNTER — Telehealth: Payer: Self-pay | Admitting: *Deleted

## 2019-02-18 ENCOUNTER — Other Ambulatory Visit: Payer: Self-pay

## 2019-02-18 ENCOUNTER — Encounter: Payer: Self-pay | Admitting: Adult Health

## 2019-02-18 ENCOUNTER — Emergency Department (HOSPITAL_COMMUNITY)
Admission: EM | Admit: 2019-02-18 | Discharge: 2019-02-18 | Disposition: A | Discharge status: Home / Self Care | Payer: BLUE CROSS/BLUE SHIELD | Attending: Emergency Medicine | Admitting: Emergency Medicine

## 2019-02-18 DIAGNOSIS — R1033 Periumbilical pain: Secondary | ICD-10-CM | POA: Insufficient documentation

## 2019-02-18 DIAGNOSIS — R110 Nausea: Secondary | ICD-10-CM | POA: Insufficient documentation

## 2019-02-18 DIAGNOSIS — Z3201 Encounter for pregnancy test, result positive: Secondary | ICD-10-CM

## 2019-02-18 DIAGNOSIS — O209 Hemorrhage in early pregnancy, unspecified: Secondary | ICD-10-CM

## 2019-02-18 DIAGNOSIS — O200 Threatened abortion: Secondary | ICD-10-CM | POA: Diagnosis not present

## 2019-02-18 DIAGNOSIS — O26899 Other specified pregnancy related conditions, unspecified trimester: Secondary | ICD-10-CM

## 2019-02-18 DIAGNOSIS — R1030 Lower abdominal pain, unspecified: Secondary | ICD-10-CM

## 2019-02-18 DIAGNOSIS — R102 Pelvic and perineal pain: Secondary | ICD-10-CM

## 2019-02-18 DIAGNOSIS — Z79899 Other long term (current) drug therapy: Secondary | ICD-10-CM | POA: Insufficient documentation

## 2019-02-18 LAB — URINALYSIS, ROUTINE W REFLEX MICROSCOPIC
Bilirubin Urine: NEGATIVE
Glucose, UA: NEGATIVE mg/dL
Hgb urine dipstick: NEGATIVE
Ketones, ur: NEGATIVE mg/dL
Leukocytes,Ua: NEGATIVE
Nitrite: NEGATIVE
Protein, ur: NEGATIVE mg/dL
Specific Gravity, Urine: 1.026 (ref 1.005–1.030)
pH: 6 (ref 5.0–8.0)

## 2019-02-18 LAB — BASIC METABOLIC PANEL
Anion gap: 12 (ref 5–15)
BUN: 8 mg/dL (ref 6–20)
CO2: 24 mmol/L (ref 22–32)
Calcium: 9.5 mg/dL (ref 8.9–10.3)
Chloride: 100 mmol/L (ref 98–111)
Creatinine, Ser: 0.62 mg/dL (ref 0.44–1.00)
GFR calc Af Amer: >60 mL/min (ref >60)
GFR calc non Af Amer: >60 mL/min (ref >60)
Glucose, Bld: 87 mg/dL (ref 70–99)
Potassium: 4 mmol/L (ref 3.5–5.1)
Sodium: 136 mmol/L (ref 135–145)

## 2019-02-18 LAB — WET PREP, GENITAL
Sperm: NONE SEEN
Trich, Wet Prep: NONE SEEN
Yeast Wet Prep HPF POC: NONE SEEN

## 2019-02-18 LAB — CBC WITH DIFFERENTIAL/PLATELET
Abs Immature Granulocytes: 0.03 10*3/uL (ref 0.00–0.07)
Basophils Absolute: 0 10*3/uL (ref 0.0–0.1)
Basophils Relative: 0 %
Eosinophils Absolute: 0 10*3/uL (ref 0.0–0.5)
Eosinophils Relative: 0 %
HCT: 45.6 % (ref 36.0–46.0)
Hemoglobin: 14 g/dL (ref 12.0–15.0)
Immature Granulocytes: 0 %
Lymphocytes Relative: 23 %
Lymphs Abs: 2 10*3/uL (ref 0.7–4.0)
MCH: 22.9 pg — ABNORMAL LOW (ref 26.0–34.0)
MCHC: 30.7 g/dL (ref 30.0–36.0)
MCV: 74.6 fL — ABNORMAL LOW (ref 80.0–100.0)
Monocytes Absolute: 0.5 10*3/uL (ref 0.1–1.0)
Monocytes Relative: 5 %
Neutro Abs: 5.9 10*3/uL (ref 1.7–7.7)
Neutrophils Relative %: 72 %
Platelets: 326 10*3/uL (ref 150–400)
RBC: 6.11 MIL/uL — ABNORMAL HIGH (ref 3.87–5.11)
RDW: 14.6 % (ref 11.5–15.5)
WBC: 8.4 10*3/uL (ref 4.0–10.5)
nRBC: 0 % (ref 0.0–0.2)

## 2019-02-18 LAB — RAPID HIV SCREEN (HIV 1/2 AB+AG)
HIV 1/2 Antibodies: NONREACTIVE
HIV-1 P24 Antigen - HIV24: NONREACTIVE

## 2019-02-18 LAB — TYPE AND SCREEN
ABO/RH(D): A POS
Antibody Screen: NEGATIVE

## 2019-02-18 LAB — POC URINE PREG, ED: Preg Test, Ur: NEGATIVE

## 2019-02-18 LAB — ABO/RH: ABO/RH(D): A POS

## 2019-02-18 LAB — HCG, QUANTITATIVE, PREGNANCY: hCG, Beta Chain, Quant, S: 105 m[IU]/mL — ABNORMAL HIGH (ref <5)

## 2019-02-18 MED ORDER — AZITHROMYCIN 1 G PO PACK
1.0000 g | PACK | Freq: Once | ORAL | Status: AC
  Administered 2019-02-18: 1 g via ORAL
  Filled 2019-02-18: qty 1

## 2019-02-18 MED ORDER — STERILE WATER FOR INJECTION IJ SOLN
INTRAMUSCULAR | Status: AC
  Administered 2019-02-18: 0.9 mL
  Filled 2019-02-18: qty 10

## 2019-02-18 MED ORDER — CEFTRIAXONE SODIUM 250 MG IJ SOLR
250.0000 mg | Freq: Once | INTRAMUSCULAR | Status: AC
  Administered 2019-02-18: 250 mg via INTRAMUSCULAR
  Filled 2019-02-18: qty 250

## 2019-02-18 MED ORDER — ONDANSETRON HCL 4 MG/2ML IJ SOLN
4.0000 mg | Freq: Once | INTRAMUSCULAR | Status: AC
  Administered 2019-02-18: 4 mg via INTRAVENOUS
  Filled 2019-02-18: qty 2

## 2019-02-18 NOTE — Telephone Encounter (Signed)
The patient is currently being seen in the ER. [facility] 105, she is scheduled for an ultrasound. T&S is pending. The ER MD will call me back if her ultrasound is abnormal, otherwise she needs a stat [facility] on Monday morning. Need to confirm blood type.  CC: Dr Quincy Simmonds (in the office on Monday)

## 2019-02-18 NOTE — ED Provider Notes (Signed)
[place] EMERGENCY DEPARTMENT Provider Note   CSN: [id number] Arrival date & time: 02/18/19  1249    History   Chief Complaint Chief Complaint  Patient presents with  . Abdominal Pain  . Nausea    HPI Maureen Lewis is a 23 y.o. female.     Pt comes in with 3 weeks of lower ab pain with some nausea. Last period April 9th. Pt took home pregnancy test and was positive. Pt preg test in ED negative. Period is due tomorrow per pt. Denies dysuria, denies emesis. Pain 6/10, no meds.  Bleeding has not been heavy.  Denies any vaginal discharge.  No hematuria. No fevers.    The history is provided by the patient. No language interpreter was used.  Abdominal Pain  Pain location:  Suprapubic Pain quality: aching   Pain radiates to:  Does not radiate Pain severity:  Mild Onset quality:  Sudden Duration:  2 weeks Timing:  Intermittent Progression:  Unchanged Chronicity:  New Context: recent sexual activity   Context: not previous surgeries, not recent illness, not sick contacts, not suspicious food intake and not trauma   Relieved by:  None tried Worsened by:  Nothing Ineffective treatments:  None tried Associated symptoms: nausea and vaginal bleeding   Associated symptoms: no anorexia, no constipation, no cough, no diarrhea, no dysuria, no fatigue, no fever, no flatus, no melena, no vaginal discharge and no vomiting   Nausea:    Severity:  Mild   Timing:  Intermittent   Progression:  Waxing and waning Vaginal bleeding:    Quality:  Lighter than menses   Severity:  Mild   Onset quality:  Sudden   Progression:  Waxing and waning   Chronicity:  New Risk factors: obesity   Risk factors: not pregnant     Past Medical History:  Diagnosis Date  . Abnormal uterine bleeding   . Acid reflux   . Amenorrhea   . Anemia    no current med.  . Complication of anesthesia    states had to keep giving her anesthesia during EGD  . Constipation   . Finger mass, left  03/2017   middle finger  . Irritable bowel syndrome (IBS)   . Morbid obesity with body mass index (BMI) of 45.0 to 49.9 in adult [facility])   . Plantar fasciitis, bilateral   . Polycystic ovary syndrome   . Shortness of breath   . Sleep apnea    no CPAP use    Patient Active Problem List   Diagnosis Date Noted  . Vitamin D deficiency 09/16/2017  . Prediabetes 09/16/2017  . Depression 09/16/2017  . Class 3 severe obesity with serious comorbidity and body mass index (BMI) of 50.0 to 59.9 in adult ([place]) 09/16/2017  . Shortness of breath on exertion 08/13/2017  . PCOS (polycystic ovarian syndrome) 08/13/2017  . Mass 05/06/2017  . Vertigo 10/06/2016  . Anemia 09/26/2016  . Pseudotumor cerebri syndrome 08/28/2016  . Obesity hypoventilation syndrome ([place]) 08/28/2016  . Benign paroxysmal positional vertigo due to bilateral vestibular disorder 08/28/2016  . OSA (obstructive sleep apnea) 08/28/2016  . Hypersomnia with sleep apnea 08/28/2016  . Female hirsutism 08/28/2016  . Back pain 04/16/2015  . Amenorrhea 04/13/2015  . Sexually active at young age 22/04/2015  . GERD (gastroesophageal reflux disease) 02/24/2015  . Constipation 10/02/2014  . Shoulder pain, left 01/29/2014    Past Surgical History:  Procedure Laterality Date  . COLONOSCOPY WITH PROPOFOL  10/07/2016  . ESOPHAGOGASTRODUODENOSCOPY  12/21/2015  . EXCISION MASS UPPER EXTREMETIES Left 04/21/2017   Procedure: EXCISION MASS LEFT MIDDLE FINGER;  Surgeon: Daryll Brod, MD;  Location: [facility];  Service: Orthopedics;  Laterality: Left;     OB History    Gravida  0   Para  0   Term  0   Preterm  0   AB  0   Living  0     SAB  0   TAB  0   Ectopic  0   Multiple  0   Live Births  0            Home Medications    Prior to Admission medications   Medication Sig Start Date End Date Taking? Authorizing Provider  benzonatate (TESSALON) 100 MG capsule Take 1-2 capsules (100-200 mg total) by  mouth 3 (three) times daily as needed for cough. Patient taking differently: Take 100 mg by mouth 3 (three) times daily as needed for cough.  10/02/18  Yes Scot Jun, FNP  buPROPion [facility] SR) 150 MG 12 hr tablet Take 1 tablet (150 mg total) by mouth daily. 04/01/18  Yes Beasley, Caren D, MD  fluticasone (FLONASE) 50 MCG/ACT nasal spray Place 2 sprays into both nostrils daily. Patient taking differently: Place 2 sprays into both nostrils daily as needed for allergies or rhinitis.  04/12/18  Yes Nafziger, Tommi Rumps, NP  ibuprofen (ADVIL,MOTRIN) 800 MG tablet Take 1 tablet (800 mg total) by mouth every 8 (eight) hours as needed. Patient taking differently: Take 800 mg by mouth every 8 (eight) hours as needed (pain).  07/15/18  Yes Salvadore Dom, MD  loratadine (CLARITIN) 10 MG tablet Take 10 mg by mouth daily as needed for allergies.    Yes [provider]  metFORMIN (GLUCOPHAGE) 500 MG tablet Take 1 tablet (500 mg total) by mouth 2 (two) times daily with a meal. Patient taking differently: Take 500 mg by mouth 2 (two) times daily before a meal.  04/01/18  Yes Beasley, Caren D, MD  norethindrone-ethinyl estradiol (JUNEL FE,GILDESS FE,LOESTRIN FE) 1-20 MG-MCG tablet Take 1 tablet by mouth daily. 07/15/18  Yes Salvadore Dom, MD  omeprazole (PRILOSEC) 40 MG capsule TAKE ONE CAPSULE BY MOUTH  DAILY Patient taking differently: TAKE 40 MG BY MOUTH  DAILY 10/16/15  Yes Luiz Blare Y, DO  polyethylene glycol (MIRALAX / GLYCOLAX) packet Take 17 g by mouth daily as needed for moderate constipation.    Yes [provider]  Vitamin D, Ergocalciferol, (DRISDOL) 50000 units CAPS capsule Take 1 capsule (50,000 Units total) by mouth every 7 (seven) days. Patient taking differently: Take 50,000 Units by mouth every Wednesday.  04/01/18  Yes Beasley, Caren D, MD  cromolyn (NASALCROM) 5.2 MG/ACT nasal spray Place 1 spray into both nostrils 4 (four) times daily. Patient not taking:  Reported on 02/18/2019 11/17/17   Zigmund Gottron, NP  diclofenac (VOLTAREN) 75 MG EC tablet Take 1 tablet (75 mg total) by mouth 2 (two) times daily as needed for moderate pain. Patient not taking: Reported on 02/18/2019 03/03/17   Katy Apo, NP  lactulose (CHRONULAC) 10 GM/15ML solution Take 15 mLs (10 g total) by mouth 2 (two) times daily. Patient not taking: Reported on 10/02/2018 04/27/18   Dorothyann Peng, NP  nystatin cream (MYCOSTATIN) Apply 1 application topically 2 (two) times daily. Apply to affected area BID for up to 7 days. Patient not taking: Reported on 02/18/2019 07/15/18   Salvadore Dom, MD  ondansetron (ZOFRAN ODT) 8 MG disintegrating tablet Take 1 tablet (8 mg total) by mouth every 8 (eight) hours as needed for nausea or vomiting. Patient not taking: Reported on 02/18/2019 11/17/17   Zigmund Gottron, NP  topiramate (TOPAMAX) 25 MG tablet Take 1 tablet (25 mg total) by mouth 2 times daily at 12 noon and 4 pm. Patient not taking: Reported on 02/18/2019 07/01/17   Dohmeier, Asencion Partridge, MD    Family History Family History  Problem Relation Age of Onset  . Diabetes Maternal Aunt   . Hypertension Maternal Uncle   . Depression Maternal Grandfather   . Diabetes Maternal Grandfather   . Hypertension Maternal Grandfather   . Arthritis Mother   . High blood pressure Mother   . Depression Mother   . Sleep apnea Mother   . Obesity Mother     Social History Social History   Tobacco Use  . Smoking status: Never Smoker  . Smokeless tobacco: Never Used  Substance Use Topics  . Alcohol use: Yes    Comment: social   . Drug use: No     Allergies   Bee pollen; Bee venom; Hydrocodone-acetaminophen; Peach flavor; and Pollen extract   Review of Systems Review of Systems  Constitutional: Negative for fatigue and fever.  Respiratory: Negative for cough.   Gastrointestinal: Positive for abdominal pain and nausea. Negative for anorexia, constipation, diarrhea, flatus, melena and  vomiting.  Genitourinary: Positive for vaginal bleeding. Negative for dysuria and vaginal discharge.  All other systems reviewed and are negative.    Physical Exam Updated Vital Signs BP (!) 144/87 (BP Location: Left Arm)   Pulse 88   Temp 98.9 F (37.2 C) (Oral)   Resp 18   SpO2 99%   Physical Exam Vitals signs and nursing note reviewed.  Constitutional:      Appearance: She is well-developed.  HENT:     Head: Normocephalic and atraumatic.     Right Ear: External ear normal.     Left Ear: External ear normal.  Eyes:     Conjunctiva/sclera: Conjunctivae normal.  Neck:     Musculoskeletal: Normal range of motion and neck supple.  Cardiovascular:     Rate and Rhythm: Normal rate.     Heart sounds: Normal heart sounds.  Pulmonary:     Effort: Pulmonary effort is normal.     Breath sounds: Normal breath sounds.  Abdominal:     General: Bowel sounds are normal.     Palpations: Abdomen is soft.     Tenderness: There is no abdominal tenderness. There is no rebound.  Genitourinary:    Vagina: Normal. No vaginal discharge, erythema or bleeding.     Comments: Os appears closed on pelvic exam, no bleeding, normal discharge., no cmt, no pain.  Musculoskeletal: Normal range of motion.  Skin:    General: Skin is warm.  Neurological:     Mental Status: She is alert and oriented to person, place, and time.      ED Treatments / Results  Labs (all labs ordered are listed, but only abnormal results are displayed) Labs Reviewed  URINE CULTURE - Abnormal; Notable for the following components:      Result Value   Culture   (*)    Value: <10,000 COLONIES/mL INSIGNIFICANT GROWTH Performed at [facility], [address] [place], [place] [address]    All other components within normal limits  WET PREP, GENITAL - Abnormal; Notable for the following components:   Clue Cells  Wet Prep HPF POC PRESENT (*)    WBC, Wet Prep HPF POC MODERATE (*)    All other components within  normal limits  HCG, QUANTITATIVE, PREGNANCY - Abnormal; Notable for the following components:   hCG, Beta Chain, Quant, S 105 (*)    All other components within normal limits  CBC WITH DIFFERENTIAL/PLATELET - Abnormal; Notable for the following components:   RBC 6.11 (*)    MCV 74.6 (*)    MCH 22.9 (*)    All other components within normal limits  BASIC METABOLIC PANEL  URINALYSIS, ROUTINE W REFLEX MICROSCOPIC  HIV ANTIBODY (ROUTINE TESTING W REFLEX)  RAPID HIV SCREEN (HIV 1/2 AB+AG)  POC URINE PREG, ED  TYPE AND SCREEN  ABO/RH  GC/CHLAMYDIA PROBE AMP ([place]) NOT AT [facility]  GC/CHLAMYDIA PROBE AMP ([place]) NOT AT [facility]    EKG None  Radiology US Ob Less Than 14 Weeks W/ Ob Transvaginal And Doppler  Result Date: 02/18/2019 CLINICAL DATA:  Pelvic pain EXAM: OBSTETRIC <14 WK [name] AND TRANSVAGINAL OB US DOPPLER ULTRASOUND OF OVARIES TECHNIQUE: Both transabdominal and transvaginal ultrasound examinations were performed for complete evaluation of the gestation as well as the maternal uterus, adnexal regions, and pelvic cul-de-sac. Transvaginal technique was performed to assess early pregnancy. Color and duplex Doppler ultrasound was utilized to evaluate blood flow to the ovaries. COMPARISON:  None. FINDINGS: Intrauterine gestational sac: None Yolk sac:  Not Visualized. Embryo:  Not Visualized. Cardiac Activity: Not Visualized. Subchorionic hemorrhage:  None visualized. Maternal uterus/adnexae: Bilateral ovaries are normal. Right ovary measures 3.6 x 3.1 x 3.4 cm with a volume of 19.6 mL. Left ovary measures 3.1 x 2.4 x 2.2 cm with a volume of 8.3 mL. Uterus is normal in size measuring 8.2 x 4.3 x 4.7 cm. Endometrium measures 17 mm in thickness. Trace pelvic free fluid likely physiologic. Pulsed Doppler evaluation of both ovaries demonstrates normal appearing low-resistance arterial and venous waveforms. IMPRESSION: 1. No intrauterine pregnancy is identified. Differential considerations include  pregnancy too early to detect versus ectopic pregnancy versus missed abortion. 2. No ovarian torsion. Electronically Signed   By: Kathreen Devoid   On: 02/18/2019 18:50    Procedures Procedures (including critical care time)  Medications Ordered in ED Medications  ondansetron (ZOFRAN) injection 4 mg (4 mg Intravenous Given 02/18/19 1500)  cefTRIAXone (ROCEPHIN) injection 250 mg (250 mg Intramuscular Given 02/18/19 1557)  azithromycin (ZITHROMAX) powder 1 g (1 g Oral Given 02/18/19 1557)  sterile water (preservative free) injection (0.9 mLs  Given 02/18/19 1559)     Initial Impression / Assessment and Plan / ED Course  I have reviewed the triage vital signs and the nursing notes.  Pertinent labs & imaging results that were available during my care of the patient were reviewed by me and considered in my medical decision making (see chart for details).        23 year old female who presents for abdominal pain, and concerned about possible pregnancy.  Patient's periods are irregular and she last had a normal period approximately 1 month ago.  About 2 weeks ago she had some spotting, and the spotting has continued.  Patient with mild nausea no vomiting, no fever.  No abnormal discharge.  No bleeding.  No dysuria.  No right lower quadrant pain on exam to suggest appendicitis.  We will send urine pregnancy, and urine analysis to evaluate for any signs of UTI or pregnancy.    Our urine pregnancy test was negative, will send a hCG  quantitative, and blood type.  Will send CBC and CMP.  Will check for any STI with GC and chlamydia HIV and RPR.  Will treat with ceftriaxone and azithromycin.  The beta quant is 105.  Given the pain, will obtain US to eval for any signs of ectopic or IUP.    Discussed with ob, Dr. Talbert Nan, who agrees with plan and if no signs of ectopic, will see patient on Monday for repeat quant.  Signed out pending [name].    Final Clinical Impressions(s) / ED Diagnoses   Final diagnoses:   Lower abdominal pain  Abortion, threatened, early pregnancy    ED Discharge Orders    None       Louanne Skye, MD 02/20/19 [phone]

## 2019-02-18 NOTE — Telephone Encounter (Signed)
She should check a UPT at home. If + she needs to let us know today. She needs to come in for an appointment, need to evaluate her to decide on ultrasound.

## 2019-02-18 NOTE — ED Notes (Signed)
Wet prep re sent to lab

## 2019-02-18 NOTE — Telephone Encounter (Signed)
Patient requesting office visit and pelvic ultrasound after seeing PCP for pelvic pain. On birth control pills and having abnormal menses.

## 2019-02-18 NOTE — Discharge Instructions (Signed)
Return to the ED with any concerns including vaginal bleeding and soaking more than one pad per hour, worsening abdominal pain, fainting or lightheadedness, or any other alarming symptoms  You should be sure to followup on Monday May 4th at your OB/GYN doctor's office to have a repeat HCG (blood work) drawn

## 2019-02-18 NOTE — Telephone Encounter (Signed)
Spoke with patient. Patient states that on March 20-27 she had a normal menses.  A few days later she had another menstrual cycle for 7 days. Reports bleeding was heavier than normal. Was changing her pad every 2 hours. Was taking OCP for contraception, but stopped 1 week ago as she ran out of her medication. Had unprotected sex on 4/14. Reports intermittent moderate uterine cramping that started 2 weeks ago. States that she began having intermittent nausea early this week. Denies any vomiting, fever, chills. Not currently having any bleeding. Patient reports her PCP is recommending evaluation and PUS. Advised will review with Dr.Jertson and return call regarding scheduling.

## 2019-02-18 NOTE — ED Provider Notes (Signed)
7:16 PM  Pt was received in signout- ultrasound is normal- no findings of ectopic- although no IUP idenitified either, ovaries and blood flow to ovaries normal.  Pt with clue cells on wet prep- but patient is not significantly symptomatic so will hold on treatment.  Pt was instructed to f/u with her GYN on Monday May 4th for repeat hcg level. She verbalized understanding.     Pixie Casino, MD 02/18/19 [phone]

## 2019-02-18 NOTE — Telephone Encounter (Signed)
Spoke with patient. Advised reviewed with Dr.Jertson. Advised she will need to head to [facility] ED for evaluation at this time. Need to ensure she is not having any complications such as an ectopic pregnancy. Advised of importance of evaluation. Patient is agreeable and will head to ED now.

## 2019-02-18 NOTE — Telephone Encounter (Signed)
Spoke with patient. Patient states that she took UPT and it is positive. Patient states that she is having moderate cramping with intermittent sharp pain on her left side. Denies any bleeding. Stopped bleeding on 02/14/2019. Advised will review with Dr.Jertson and return call. Patient is agreeable.

## 2019-02-18 NOTE — ED Triage Notes (Signed)
Pt comes in with 3 weeks of lower ab pain with some nausea. Last period April 9th. Pt took home pregnancy test and was positive. Pt preg test in ED negative. Period is due tomorrow per pt. Denies dysuria, denies emesis. Pain 6/10, no meds PTA.

## 2019-02-18 NOTE — ED Notes (Signed)
Pt returned to room from US.

## 2019-02-18 NOTE — ED Notes (Signed)
Patient transported to Ultrasound 

## 2019-02-19 LAB — URINE CULTURE: Culture: <10000 — AB

## 2019-02-19 LAB — HIV ANTIBODY (ROUTINE TESTING W REFLEX): HIV Screen 4th Generation wRfx: NONREACTIVE

## 2019-02-21 ENCOUNTER — Other Ambulatory Visit: Payer: Self-pay

## 2019-02-21 ENCOUNTER — Telehealth: Payer: Self-pay | Admitting: Obstetrics and Gynecology

## 2019-02-21 ENCOUNTER — Other Ambulatory Visit (INDEPENDENT_AMBULATORY_CARE_PROVIDER_SITE_OTHER): Payer: BLUE CROSS/BLUE SHIELD

## 2019-02-21 ENCOUNTER — Telehealth: Payer: Self-pay | Admitting: *Deleted

## 2019-02-21 DIAGNOSIS — Z3201 Encounter for pregnancy test, result positive: Secondary | ICD-10-CM

## 2019-02-21 DIAGNOSIS — O209 Hemorrhage in early pregnancy, unspecified: Secondary | ICD-10-CM

## 2019-02-21 LAB — GC/CHLAMYDIA PROBE AMP (~~LOC~~) NOT AT ARMC
Chlamydia: NEGATIVE
Neisseria Gonorrhea: NEGATIVE

## 2019-02-21 LAB — BETA HCG QUANT (REF LAB): hCG Quant: 332 m[IU]/mL

## 2019-02-21 NOTE — Telephone Encounter (Signed)
Patient was seen in the ED over the weekend and need an appointment for repeat HCG levels.

## 2019-02-21 NOTE — Telephone Encounter (Signed)
See telephone encounter dated 02/18/19, lab appt scheduled for [place] at 1130.   Encounter closed.

## 2019-02-21 NOTE — Addendum Note (Signed)
Addended by: Burnice Logan on: 02/21/2019 09:58 AM   Modules accepted: Orders

## 2019-02-21 NOTE — Telephone Encounter (Signed)
Per review of Epic, ABO/Rh and type and screen preformed at [facility] ED on 02/18/19, A positive.  Call to patient, lab appointment scheduled for today at 1130 at [facility], patient declined earlier time. Order placed for STAT beta hcg. Patient verbalizes understanding and is agreeable.   Routing to provider for final review. Patient is agreeable to disposition. Will close encounter.  Cc: Dr. Talbert Nan

## 2019-02-21 NOTE — Telephone Encounter (Signed)
Spoke with Vaughan Basta from [facility].   STAT beta hcg: 332  Routing to Dr. Quincy Simmonds

## 2019-02-21 NOTE — Telephone Encounter (Signed)
-----   Message from Nunzio Cobbs, MD sent at 02/21/2019  2:11 PM EDT ----- Please let patient know that her hCG level is rising as expected.  She can have an ultrasound and recheck appointment next week on Thursday.  This will need to go to precert.  I will sent this to Dr. Talbert Nan as it is her patient.   Cc- Dr. Talbert Nan,  Lerry Liner

## 2019-02-21 NOTE — Telephone Encounter (Signed)
Please see result note 

## 2019-02-21 NOTE — Telephone Encounter (Signed)
Spoke with patient, advised as seen below per Dr. Quincy Simmonds. Advised patient I will need to review PUS schedule with Dr. Talbert Nan on 5/5 and return call to schedule. ER precautions provided for severe pain and or bleeding. Patient verbalizes understanding and is agreeable to plan.

## 2019-02-22 ENCOUNTER — Ambulatory Visit: Payer: BLUE CROSS/BLUE SHIELD | Admitting: Obstetrics and Gynecology

## 2019-02-22 NOTE — Telephone Encounter (Signed)
Notes recorded by Burnice Logan, RN on 02/22/2019 at 11:15 AM EDT Addendum to previous entry: Patient denies any vaginal bleeding or pain today. Future lab order placed.  Notes recorded by Burnice Logan, RN on 02/22/2019 at 11:12 AM EDT Spoke with patient, advised as seen below per Dr. Talbert Nan. Lab appt scheduled for 5/6 at 11:30am. Patient verbalizes understanding and is agreeable.   ------  Notes recorded by Salvadore Dom, MD on 02/22/2019 at 9:41 AM EDT Given that she has had bleeding a week ago and pain, we should continue to follow her BhcG. Please have her come in tomorrow for another BhcG.   Routing to provider for final review. Patient is agreeable to disposition. Will close encounter.  ------

## 2019-02-23 ENCOUNTER — Other Ambulatory Visit: Payer: Self-pay

## 2019-02-23 ENCOUNTER — Other Ambulatory Visit: Payer: Self-pay | Admitting: *Deleted

## 2019-02-23 ENCOUNTER — Other Ambulatory Visit (INDEPENDENT_AMBULATORY_CARE_PROVIDER_SITE_OTHER): Payer: BLUE CROSS/BLUE SHIELD

## 2019-02-23 DIAGNOSIS — Z3201 Encounter for pregnancy test, result positive: Secondary | ICD-10-CM

## 2019-02-23 DIAGNOSIS — O209 Hemorrhage in early pregnancy, unspecified: Secondary | ICD-10-CM

## 2019-02-23 LAB — BETA HCG QUANT (REF LAB): hCG Quant: 852 m[IU]/mL

## 2019-02-25 ENCOUNTER — Encounter (HOSPITAL_COMMUNITY): Payer: Self-pay | Admitting: *Deleted

## 2019-02-25 ENCOUNTER — Other Ambulatory Visit: Payer: Self-pay

## 2019-02-25 ENCOUNTER — Telehealth: Payer: Self-pay | Admitting: *Deleted

## 2019-02-25 ENCOUNTER — Other Ambulatory Visit (INDEPENDENT_AMBULATORY_CARE_PROVIDER_SITE_OTHER): Payer: BLUE CROSS/BLUE SHIELD

## 2019-02-25 ENCOUNTER — Inpatient Hospital Stay (HOSPITAL_COMMUNITY): Payer: BLUE CROSS/BLUE SHIELD

## 2019-02-25 ENCOUNTER — Inpatient Hospital Stay (HOSPITAL_COMMUNITY)
Admission: AD | Admit: 2019-02-25 | Discharge: 2019-02-25 | Disposition: A | Discharge status: Home / Self Care | Payer: BLUE CROSS/BLUE SHIELD | Attending: Obstetrics & Gynecology | Admitting: Obstetrics & Gynecology

## 2019-02-25 DIAGNOSIS — O26891 Other specified pregnancy related conditions, first trimester: Secondary | ICD-10-CM | POA: Insufficient documentation

## 2019-02-25 DIAGNOSIS — Z8261 Family history of arthritis: Secondary | ICD-10-CM | POA: Diagnosis not present

## 2019-02-25 DIAGNOSIS — Z7984 Long term (current) use of oral hypoglycemic drugs: Secondary | ICD-10-CM | POA: Insufficient documentation

## 2019-02-25 DIAGNOSIS — Z91030 Bee allergy status: Secondary | ICD-10-CM | POA: Diagnosis not present

## 2019-02-25 DIAGNOSIS — O26899 Other specified pregnancy related conditions, unspecified trimester: Secondary | ICD-10-CM

## 2019-02-25 DIAGNOSIS — Z3201 Encounter for pregnancy test, result positive: Secondary | ICD-10-CM

## 2019-02-25 DIAGNOSIS — Z3A01 Less than 8 weeks gestation of pregnancy: Secondary | ICD-10-CM | POA: Insufficient documentation

## 2019-02-25 DIAGNOSIS — O3680X0 Pregnancy with inconclusive fetal viability, not applicable or unspecified: Secondary | ICD-10-CM | POA: Diagnosis present

## 2019-02-25 DIAGNOSIS — B9689 Other specified bacterial agents as the cause of diseases classified elsewhere: Secondary | ICD-10-CM | POA: Diagnosis not present

## 2019-02-25 DIAGNOSIS — O209 Hemorrhage in early pregnancy, unspecified: Secondary | ICD-10-CM

## 2019-02-25 DIAGNOSIS — N760 Acute vaginitis: Secondary | ICD-10-CM | POA: Diagnosis present

## 2019-02-25 DIAGNOSIS — Z79899 Other long term (current) drug therapy: Secondary | ICD-10-CM | POA: Insufficient documentation

## 2019-02-25 DIAGNOSIS — R109 Unspecified abdominal pain: Secondary | ICD-10-CM | POA: Diagnosis present

## 2019-02-25 DIAGNOSIS — Z8249 Family history of ischemic heart disease and other diseases of the circulatory system: Secondary | ICD-10-CM | POA: Insufficient documentation

## 2019-02-25 DIAGNOSIS — O23591 Infection of other part of genital tract in pregnancy, first trimester: Secondary | ICD-10-CM | POA: Diagnosis not present

## 2019-02-25 DIAGNOSIS — R1030 Lower abdominal pain, unspecified: Secondary | ICD-10-CM | POA: Insufficient documentation

## 2019-02-25 DIAGNOSIS — Z885 Allergy status to narcotic agent status: Secondary | ICD-10-CM | POA: Diagnosis not present

## 2019-02-25 DIAGNOSIS — R102 Pelvic and perineal pain: Secondary | ICD-10-CM

## 2019-02-25 LAB — CBC
HCT: 37.7 % (ref 36.0–46.0)
Hemoglobin: 11.8 g/dL — ABNORMAL LOW (ref 12.0–15.0)
MCH: 23 pg — ABNORMAL LOW (ref 26.0–34.0)
MCHC: 31.3 g/dL (ref 30.0–36.0)
MCV: 73.5 fL — ABNORMAL LOW (ref 80.0–100.0)
Platelets: 282 10*3/uL (ref 150–400)
RBC: 5.13 MIL/uL — ABNORMAL HIGH (ref 3.87–5.11)
RDW: 14.8 % (ref 11.5–15.5)
WBC: 9.5 10*3/uL (ref 4.0–10.5)
nRBC: 0 % (ref 0.0–0.2)

## 2019-02-25 LAB — URINALYSIS, ROUTINE W REFLEX MICROSCOPIC
Bilirubin Urine: NEGATIVE
Glucose, UA: NEGATIVE mg/dL
Ketones, ur: NEGATIVE mg/dL
Leukocytes,Ua: NEGATIVE
Nitrite: NEGATIVE
Protein, ur: NEGATIVE mg/dL
Specific Gravity, Urine: 1.021 (ref 1.005–1.030)
pH: 5 (ref 5.0–8.0)

## 2019-02-25 LAB — BETA HCG QUANT (REF LAB): hCG Quant: 1908 m[IU]/mL

## 2019-02-25 LAB — WET PREP, GENITAL
Sperm: NONE SEEN
Trich, Wet Prep: NONE SEEN
Yeast Wet Prep HPF POC: NONE SEEN

## 2019-02-25 MED ORDER — METRONIDAZOLE 500 MG PO TABS: 500.0000 mg | ORAL_TABLET | Freq: Two times a day (BID) | ORAL | 0 refills | Status: AC

## 2019-02-25 NOTE — Telephone Encounter (Signed)
STAT Beta Hcg 1908.   Reviewed with Dr. Talbert Nan, if no pain or bleeding, proceed with ultrasound on 02/28/19. If pain or bleeding needs to go to [facility] now for further evaluation.   Call placed to patient, advised of results as seen above. Patient reports intermittent mild cramping 4/10 that started late Wednesday night. Denies vaginal bleeding. Instructed patient to go directly to [facility] for further evaluation. Will update Dr. Talbert Nan. Patient verbalizes understanding and is agreeable.   Call returned to Dr. Talbert Nan, provided update, no additional recommendations.   Routing to provider for final review. Patient is agreeable to disposition. Will close encounter.

## 2019-02-25 NOTE — MAU Provider Note (Addendum)
History     CSN: [id number]  Arrival date and time: 02/25/19 1533  Provider's first contact with patient at [place]  Patient presents with  . Abdominal Pain   HPI  Ms.  Maureen Lewis is a 23 y.o. year old G34P0000 female at [redacted]w[redacted]d weeks gestation who was sent to MAU from her GYN office for complaints of abdominal pain. She started having lower LT abdominal and lower back pain since Wednesday 5/6 that eased up on Thursday She has been followed by Dr. Talbert Nan. She has had rising HCG levels drawn every 48 hrs (5/4 = 332, 5/6=852, 5/8=1908). She was scheduled to have a U/S on 5/11. While in the office today, she started having abdominal pain again.    Past Medical History:  Diagnosis Date  . Abnormal uterine bleeding   . Acid reflux   . Amenorrhea   . Anemia    no current med.  . Complication of anesthesia    states had to keep giving her anesthesia during EGD  . Constipation   . Finger mass, left 03/2017   middle finger  . Irritable bowel syndrome (IBS)   . Morbid obesity with body mass index (BMI) of 45.0 to 49.9 in adult [facility])   . Plantar fasciitis, bilateral   . Polycystic ovary syndrome   . Shortness of breath   . Sleep apnea    no CPAP use    Past Surgical History:  Procedure Laterality Date  . COLONOSCOPY WITH PROPOFOL  10/07/2016  . ESOPHAGOGASTRODUODENOSCOPY  12/21/2015  . EXCISION MASS UPPER EXTREMETIES Left 04/21/2017   Procedure: EXCISION MASS LEFT MIDDLE FINGER;  Surgeon: Daryll Brod, MD;  Location: [place];  Service: Orthopedics;  Laterality: Left;    Family History  Problem Relation Age of Onset  . Diabetes Maternal Aunt   . Hypertension Maternal Uncle   . Depression Maternal Grandfather   . Diabetes Maternal Grandfather   . Hypertension Maternal Grandfather   . Arthritis Mother   . High blood pressure Mother   . Depression Mother   . Sleep apnea Mother   . Obesity Mother     Social History   Tobacco Use  . Smoking  status: Never Smoker  . Smokeless tobacco: Never Used  Substance Use Topics  . Alcohol use: Yes    Comment: social   . Drug use: No    Allergies:  Allergies  Allergen Reactions  . Bee Pollen Hives, Shortness Of Breath and Swelling  . Bee Venom Hives, Shortness Of Breath and Swelling  . Hydrocodone-Acetaminophen Anaphylaxis  . Peach Flavor Hives, Shortness Of Breath and Other (See Comments)    SWELLING OF MOUTH  . Pollen Extract Hives, Shortness Of Breath and Swelling    Medications Prior to Admission  Medication Sig Dispense Refill Last Dose  . benzonatate (TESSALON) 100 MG capsule Take 1-2 capsules (100-200 mg total) by mouth 3 (three) times daily as needed for cough. (Patient taking differently: Take 100 mg by mouth 3 (three) times daily as needed for cough. ) 60 capsule 0 2 weeks ago  . buPROPion (WELLBUTRIN SR) 150 MG 12 hr tablet Take 1 tablet (150 mg total) by mouth daily. 30 tablet 0 02/16/2019 at am  . cromolyn (NASALCROM) 5.2 MG/ACT nasal spray Place 1 spray into both nostrils 4 (four) times daily. (Patient not taking: Reported on 02/18/2019) 26 mL 12 Not Taking at Unknown time  . diclofenac (VOLTAREN) 75 MG EC tablet Take 1 tablet (  75 mg total) by mouth 2 (two) times daily as needed for moderate pain. (Patient not taking: Reported on 02/18/2019) 20 tablet 0 Not Taking at Unknown time  . fluticasone (FLONASE) 50 MCG/ACT nasal spray Place 2 sprays into both nostrils daily. (Patient taking differently: Place 2 sprays into both nostrils daily as needed for allergies or rhinitis. ) 16 g 6 few days ago  . ibuprofen (ADVIL,MOTRIN) 800 MG tablet Take 1 tablet (800 mg total) by mouth every 8 (eight) hours as needed. (Patient taking differently: Take 800 mg by mouth every 8 (eight) hours as needed (pain). ) 30 tablet 1 3 days ago  . lactulose (CHRONULAC) 10 GM/15ML solution Take 15 mLs (10 g total) by mouth 2 (two) times daily. (Patient not taking: Reported on 10/02/2018) 472 mL 1 Not Taking at  Unknown time  . loratadine (CLARITIN) 10 MG tablet Take 10 mg by mouth daily as needed for allergies.    week ago  . metFORMIN (GLUCOPHAGE) 500 MG tablet Take 1 tablet (500 mg total) by mouth 2 (two) times daily with a meal. (Patient taking differently: Take 500 mg by mouth 2 (two) times daily before a meal. ) 60 tablet 0 02/16/2019  . norethindrone-ethinyl estradiol (JUNEL FE,GILDESS FE,LOESTRIN FE) 1-20 MG-MCG tablet Take 1 tablet by mouth daily. 3 Package 0 2 weeks ago  . nystatin cream (MYCOSTATIN) Apply 1 application topically 2 (two) times daily. Apply to affected area BID for up to 7 days. (Patient not taking: Reported on 02/18/2019) 30 g 0 Not Taking at Unknown time  . omeprazole (PRILOSEC) 40 MG capsule TAKE ONE CAPSULE BY MOUTH  DAILY (Patient taking differently: TAKE 40 MG BY MOUTH  DAILY) 30 capsule 3 02/16/2019  . ondansetron (ZOFRAN ODT) 8 MG disintegrating tablet Take 1 tablet (8 mg total) by mouth every 8 (eight) hours as needed for nausea or vomiting. (Patient not taking: Reported on 02/18/2019) 30 tablet 0 Not Taking at Unknown time  . polyethylene glycol (MIRALAX / GLYCOLAX) packet Take 17 g by mouth daily as needed for moderate constipation.    few months ago  . topiramate (TOPAMAX) 25 MG tablet Take 1 tablet (25 mg total) by mouth 2 times daily at 12 noon and 4 pm. (Patient not taking: Reported on 02/18/2019) 60 tablet 11 Not Taking at Unknown time  . Vitamin D, Ergocalciferol, (DRISDOL) 50000 units CAPS capsule Take 1 capsule (50,000 Units total) by mouth every 7 (seven) days. (Patient taking differently: Take 50,000 Units by mouth every Wednesday. ) 4 capsule 0 02/16/2019    Review of Systems  Constitutional: Negative.   HENT: Negative.   Eyes: Negative.   Respiratory: Negative.   Cardiovascular: Negative.   Gastrointestinal: Negative.   Endocrine: Negative.   Genitourinary: Positive for pelvic pain.  Musculoskeletal: Negative.   Skin: Negative.   Allergic/Immunologic: Negative.    Neurological: Negative.   Hematological: Negative.   Psychiatric/Behavioral: Negative.    Physical Exam   Blood pressure 130/81, pulse (!) 102, temperature 98.8 F (37.1 C), temperature source Oral, resp. rate 18, height 6\' 1"  (1.854 m), weight (!) 186.3 kg, last menstrual period 01/21/2019, SpO2 100 %.  Physical Exam  Nursing note and vitals reviewed. Constitutional: She is oriented to person, place, and time. She appears well-developed and well-nourished.  HENT:  Head: Normocephalic and atraumatic.  Eyes: Pupils are equal, round, and reactive to light.  Neck: Normal range of motion.  Cardiovascular: Normal rate.  Respiratory: Effort normal.  GI: Soft.  Genitourinary:  Genitourinary Comments: Uterus: non-tender, SE: cervix is smooth, pink, no lesions, small amt of thin, white vaginal d/c -- WP, GC/CT done, closed/long/firm, no CMT or friability, no adnexal tenderness    Musculoskeletal: Normal range of motion.  Neurological: She is alert and oriented to person, place, and time.  Skin: Skin is warm and dry.  Psychiatric: She has a normal mood and affect. Her behavior is normal. Judgment and thought content normal.    MAU Course  Procedures  MDM CCUA CBC ABO/Rh -- known HCG -- done in the GYN office today Wet Prep GC/CT -- pending HIV -- pending OB < 14 wks [name] with TV  Results for orders placed or performed during the hospital encounter of 02/25/19 (from the past 24 hour(s))  Urinalysis, Routine w reflex microscopic     Status: Abnormal   Collection Time: 02/25/19  4:14 PM  Result Value Ref Range   Color, Urine YELLOW YELLOW   APPearance CLEAR CLEAR   Specific Gravity, Urine 1.021 1.005 - 1.030   pH 5.0 5.0 - 8.0   Glucose, UA NEGATIVE NEGATIVE mg/dL   Hgb urine dipstick SMALL (A) NEGATIVE   Bilirubin Urine NEGATIVE NEGATIVE   Ketones, ur NEGATIVE NEGATIVE mg/dL   Protein, ur NEGATIVE NEGATIVE mg/dL   Nitrite NEGATIVE NEGATIVE   Leukocytes,Ua NEGATIVE NEGATIVE    RBC / HPF 0-5 0 - 5 RBC/hpf   WBC, UA 0-5 0 - 5 WBC/hpf   Bacteria, UA RARE (A) NONE SEEN   Squamous Epithelial / LPF 0-5 0 - 5   Mucus PRESENT   CBC     Status: Abnormal   Collection Time: 02/25/19  4:47 PM  Result Value Ref Range   WBC 9.5 4.0 - 10.5 K/uL   RBC 5.13 (H) 3.87 - 5.11 MIL/uL   Hemoglobin 11.8 (L) 12.0 - 15.0 g/dL   HCT 37.7 36.0 - 46.0 %   MCV 73.5 (L) 80.0 - 100.0 fL   MCH 23.0 (L) 26.0 - 34.0 pg   MCHC 31.3 30.0 - 36.0 g/dL   RDW 14.8 11.5 - 15.5 %   Platelets 282 150 - 400 K/uL   nRBC 0.0 0.0 - 0.2 %  Wet prep, genital     Status: Abnormal   Collection Time: 02/25/19  5:00 PM  Result Value Ref Range   Yeast Wet Prep HPF POC NONE SEEN NONE SEEN   Trich, Wet Prep NONE SEEN NONE SEEN   Clue Cells Wet Prep HPF POC PRESENT (A) NONE SEEN   WBC, Wet Prep HPF POC FEW (A) NONE SEEN   Sperm NONE SEEN     US Ob Transvaginal  Result Date: 02/25/2019 CLINICAL DATA:  Initial evaluation for acute abdominal with early pregnancy. EXAM: TRANSVAGINAL OB ULTRASOUND TECHNIQUE: Transvaginal ultrasound was performed for complete evaluation of the gestation as well as the maternal uterus, adnexal regions, and pelvic cul-de-sac. COMPARISON:  Prior ultrasound from 02/18/2019 FINDINGS: Intrauterine gestational sac: Single Yolk sac:  Not visualized. Embryo:  Not visualized. Cardiac Activity: N/A Heart Rate: N/A bpm MSD: 5.4 mm   5 w   2 d Subchorionic hemorrhage:  None visualized. Maternal uterus/adnexae: Ovaries are normal in appearance bilaterally. No adnexal mass or free fluid. IMPRESSION: 1. Probable early intrauterine gestational sac, but no yolk sac, fetal pole, or cardiac activity yet visualized. Recommend follow-up quantitative B-HCG levels and follow-up US in 14 days to confirm and assess viability. This recommendation follows SRU consensus guidelines: Diagnostic Criteria for Nonviable Pregnancy Early in the First  Trimester. [place] [employer] Med 2013; 846:9629-52. 2. No other acute maternal  uterine or adnexal abnormality identified. Electronically Signed By: Jeannine Boga M.D.  On: 02/25/2019 17:41    Assessment and Plan  Pregnancy with uncertain fetal viability, single or unspecified fetus  - Repeat HCG in [place] on Monday 02/28/2019 - Advised to return to MAU for increased pain  Abdominal pain affecting pregnancy  - Advised to take Tylenol 1000 mg every 6 hrs prn pain - Information provided on abdominal pain in pregnancy   Bacterial Vaginosis - Rx Flagyl 500 mg BID x 7 days - Information provided on BV   - Discharge patient - Call [facility] to get scheduled for rpt blood work on Monday 02/28/2019  Laury Deep, MSN, CNM 02/25/2019, 5:02 PM

## 2019-02-25 NOTE — MAU Note (Signed)
Wed night, started having pain in lower abd on left side and lower back eased up on Thur.  Went to office today to have blood work drawn.  Started having pain again, dr told her to come over here.

## 2019-02-28 ENCOUNTER — Telehealth: Payer: Self-pay | Admitting: Obstetrics and Gynecology

## 2019-02-28 ENCOUNTER — Other Ambulatory Visit: Payer: Self-pay

## 2019-02-28 ENCOUNTER — Other Ambulatory Visit: Payer: BLUE CROSS/BLUE SHIELD

## 2019-02-28 DIAGNOSIS — R102 Pelvic and perineal pain: Secondary | ICD-10-CM

## 2019-02-28 DIAGNOSIS — O26891 Other specified pregnancy related conditions, first trimester: Secondary | ICD-10-CM

## 2019-02-28 DIAGNOSIS — Z3201 Encounter for pregnancy test, result positive: Secondary | ICD-10-CM

## 2019-02-28 LAB — GC/CHLAMYDIA PROBE AMP (~~LOC~~) NOT AT ARMC
Chlamydia: NEGATIVE
Neisseria Gonorrhea: NEGATIVE

## 2019-02-28 LAB — BETA HCG QUANT (REF LAB): hCG Quant: 3840 m[IU]/mL

## 2019-02-28 NOTE — Telephone Encounter (Signed)
Patient coming in for [facility]cG today. If she has increasing pain she needs to be seen in the ER, otherwise please put her on the schedule for a f/u ultrasound on Thursday to document IUP.

## 2019-02-28 NOTE — Telephone Encounter (Signed)
Spoke with patient. Patient states that she was advised to come in for follow up lab work today from ER evaluation on 02/25/2019. Reports she is still having intermittent pelvic pain that fluctuates between the left and right side. Pain is a 4/10 when it occurs. Relieved with tylenol. Pain is occurring more in the morning than at night. Patient is being treated for BV with Flagyl 500 mg BID x 7 days. Results from [name] below. Scheduled for STAT beta HCG today at 10 am. Patient is agreeable to date and time.  1. Probable early intrauterine gestational sac, but no yolk sac, fetal pole, or cardiac activity yet visualized. Recommend follow-up quantitative B-HCG levels and follow-up US in 14 days to confirm and assess viability. This recommendation follows SRU consensus guidelines: Diagnostic Criteria for Nonviable Pregnancy Early in the First Trimester. [employer] Med 2013; 161:0960-45. 2. No other acute maternal uterine or adnexal abnormality identified.

## 2019-02-28 NOTE — Telephone Encounter (Signed)
Patient was seen in the ER on Friday and need to schedule appointment for HCG labs.

## 2019-02-28 NOTE — Telephone Encounter (Signed)
Spoke with patient. Advised Beta HCG level is 3840. Reviewed with Dr.Jertson. Level looks good. Needs [name] on 03/03/2019. Patient is agreeable. Appointment scheduled for 03/03/2019 at 11 am with 11:30 am consult with Dr.Jertson. Patient is agreeable to date and time. Aware if her symptoms worsen she will need to be seen in the ER. Patient is agreeable. Order placed for ultrasound.   Cc: Lerry Liner for [employer] to provider and will close encounter.

## 2019-03-02 ENCOUNTER — Other Ambulatory Visit: Payer: Self-pay

## 2019-03-03 ENCOUNTER — Ambulatory Visit: Payer: BLUE CROSS/BLUE SHIELD | Admitting: Obstetrics and Gynecology

## 2019-03-03 ENCOUNTER — Ambulatory Visit (INDEPENDENT_AMBULATORY_CARE_PROVIDER_SITE_OTHER): Payer: BLUE CROSS/BLUE SHIELD

## 2019-03-03 ENCOUNTER — Encounter: Payer: Self-pay | Admitting: Obstetrics and Gynecology

## 2019-03-03 VITALS — BP 126/84 | HR 88 | Temp 98.0°F | Ht 73.0 in | Wt >= 6400 oz

## 2019-03-03 DIAGNOSIS — O26891 Other specified pregnancy related conditions, first trimester: Secondary | ICD-10-CM | POA: Diagnosis not present

## 2019-03-03 DIAGNOSIS — Z3201 Encounter for pregnancy test, result positive: Secondary | ICD-10-CM | POA: Diagnosis not present

## 2019-03-03 DIAGNOSIS — R102 Pelvic and perineal pain: Secondary | ICD-10-CM

## 2019-03-03 DIAGNOSIS — Z3490 Encounter for supervision of normal pregnancy, unspecified, unspecified trimester: Secondary | ICD-10-CM

## 2019-03-03 DIAGNOSIS — Z3687 Encounter for antenatal screening for uncertain dates: Secondary | ICD-10-CM | POA: Diagnosis not present

## 2019-03-03 NOTE — Progress Notes (Signed)
GYNECOLOGY  VISIT   HPI: 23 y.o.   Single Black or African American Not Hispanic or Latino  female   G1P0000 with Patient's last menstrual period was 01/21/2019.  here for  Viability scan. She continues to have intermittent mild abdominal pain, the pain is lateral on either side, lasts for a couple of minutes. No bleeding.  She is excited.  She is taking PNV. She has been with her partner x 3 months, states he is happy.   GYNECOLOGIC HISTORY: Patient's last menstrual period was 01/21/2019. Contraception:none Menopausal hormone therapy: none        OB History    Gravida  1   Para  0   Term  0   Preterm  0   AB  0   Living  0     SAB  0   TAB  0   Ectopic  0   Multiple  0   Live Births  0              Patient Active Problem List   Diagnosis Date Noted  . Bacterial vaginosis 02/25/2019  . Pregnancy with uncertain fetal viability 02/25/2019  . Vitamin D deficiency 09/16/2017  . Prediabetes 09/16/2017  . Depression 09/16/2017  . Class 3 severe obesity with serious comorbidity and body mass index (BMI) of 50.0 to 59.9 in adult ([place]) 09/16/2017  . Shortness of breath on exertion 08/13/2017  . PCOS (polycystic ovarian syndrome) 08/13/2017  . Mass 05/06/2017  . Vertigo 10/06/2016  . Anemia 09/26/2016  . Pseudotumor cerebri syndrome 08/28/2016  . Obesity hypoventilation syndrome ([place]) 08/28/2016  . Benign paroxysmal positional vertigo due to bilateral vestibular disorder 08/28/2016  . OSA (obstructive sleep apnea) 08/28/2016  . Hypersomnia with sleep apnea 08/28/2016  . Female hirsutism 08/28/2016  . Back pain 04/16/2015  . Amenorrhea 04/13/2015  . Sexually active at young age 73/04/2015  . GERD (gastroesophageal reflux disease) 02/24/2015  . Constipation 10/02/2014  . Shoulder pain, left 01/29/2014    Past Medical History:  Diagnosis Date  . Abnormal uterine bleeding   . Acid reflux   . Amenorrhea   . Anemia    no current med.  . Complication of  anesthesia    states had to keep giving her anesthesia during EGD  . Constipation   . Finger mass, left 03/2017   middle finger  . Irritable bowel syndrome (IBS)   . Morbid obesity with body mass index (BMI) of 45.0 to 49.9 in adult [facility])   . Plantar fasciitis, bilateral   . Polycystic ovary syndrome   . Shortness of breath   . Sleep apnea    no CPAP use    Past Surgical History:  Procedure Laterality Date  . COLONOSCOPY WITH PROPOFOL  10/07/2016  . ESOPHAGOGASTRODUODENOSCOPY  12/21/2015  . EXCISION MASS UPPER EXTREMETIES Left 04/21/2017   Procedure: EXCISION MASS LEFT MIDDLE FINGER;  Surgeon: Daryll Brod, MD;  Location: [place];  Service: Orthopedics;  Laterality: Left;    Current Outpatient Medications  Medication Sig Dispense Refill  . acetaminophen (TYLENOL) 325 MG tablet Take 650 mg by mouth every 6 (six) hours as needed.    . metroNIDAZOLE (FLAGYL) 500 MG tablet Take 1 tablet (500 mg total) by mouth 2 (two) times daily for 7 days. 14 tablet 0  . buPROPion (WELLBUTRIN SR) 150 MG 12 hr tablet Take 1 tablet (150 mg total) by mouth daily. (Patient not taking: Reported on 03/03/2019) 30 tablet 0  . loratadine (  CLARITIN) 10 MG tablet Take 10 mg by mouth daily as needed for allergies.     . metFORMIN (GLUCOPHAGE) 500 MG tablet Take 1 tablet (500 mg total) by mouth 2 (two) times daily with a meal. (Patient not taking: Reported on 03/03/2019) 60 tablet 0   No current facility-administered medications for this visit.      ALLERGIES: Bee pollen; Bee venom; Hydrocodone-acetaminophen; Peach flavor; and Pollen extract  Family History  Problem Relation Age of Onset  . Diabetes Maternal Aunt   . Hypertension Maternal Uncle   . Depression Maternal Grandfather   . Diabetes Maternal Grandfather   . Hypertension Maternal Grandfather   . Arthritis Mother   . High blood pressure Mother   . Depression Mother   . Sleep apnea Mother   . Obesity Mother     Social History    Socioeconomic History  . Marital status: Single    Spouse name: Not on file  . Number of children: Not on file  . Years of education: Not on file  . Highest education level: Not on file  Occupational History  . Occupation: Chemical engineer: [employer]  . Financial resource strain: Not on file  . Food insecurity:    Worry: Not on file    Inability: Not on file  . Transportation needs:    Medical: Not on file    Non-medical: Not on file  Tobacco Use  . Smoking status: Never Smoker  . Smokeless tobacco: Never Used  Substance and Sexual Activity  . Alcohol use: Yes    Comment: social   . Drug use: No  . Sexual activity: Yes    Birth control/protection: Condom  Lifestyle  . Physical activity:    Days per week: Not on file    Minutes per session: Not on file  . Stress: Not on file  Relationships  . Social connections:    Talks on phone: Not on file    Gets together: Not on file    Attends religious service: Not on file    Active member of club or organization: Not on file    Attends meetings of clubs or organizations: Not on file    Relationship status: Not on file  . Intimate partner violence:    Fear of current or ex partner: Not on file    Emotionally abused: Not on file    Physically abused: Not on file    Forced sexual activity: Not on file  Other Topics Concern  . Not on file  Social History Narrative   Lives with mom and cousin, a grandmother in [place] also helps care for her.       She is in nursing school.     Review of Systems  Constitutional: Negative.   HENT: Negative.   Eyes: Negative.   Respiratory: Negative.   Cardiovascular: Negative.   Gastrointestinal: Negative.   Genitourinary: Negative.   Musculoskeletal: Negative.   Skin: Negative.   Neurological: Negative.   Endo/Heme/Allergies: Negative.   Psychiatric/Behavioral: Negative.   All other systems reviewed and are negative.   PHYSICAL EXAMINATION:    BP  126/84   Pulse 88   Temp 98 F (36.7 C) (Oral)   Ht 6\' 1"  (1.854 m)   Wt (!) 410 lb (186 kg)   LMP 01/21/2019   BMI 54.09 kg/m     General appearance: alert, cooperative and appears stated age  Ultrasound images reviewed with the patient  ASSESSMENT  Early pregnancy, some pain, viable IUP on ultrasound. No concerning findings    PLAN Continue PNV Discussed avoiding OTC medications, ETOH F/U with OB, #'s given   An After Visit Summary was printed and given to the patient.

## 2019-03-03 NOTE — Patient Instructions (Signed)
Commonly Asked Questions During Pregnancy  Cats: A parasite can be excreted in cat feces.  To avoid exposure you need to have another person empty the little box.  If you must empty the litter box you will need to wear gloves.  Wash your hands after handling your cat.  This parasite can also be found in raw or undercooked meat so this should also be avoided.  Colds, Sore Throats, Flu: Please check your medication sheet to see what you can take for symptoms.  If your symptoms are unrelieved by these medications please call the office.  Dental Work: Most any dental work your dentist recommends is permitted.  X-rays should only be taken during the first trimester if absolutely necessary.  Your abdomen should be shielded with a lead apron during all x-rays.  Please notify your provider prior to receiving any x-rays.  Novocaine is fine; gas is not recommended.  If your dentist requires a note from us prior to dental work please call the office and we will provide one for you.  Exercise: Exercise is an important part of staying healthy during your pregnancy.  You may continue most exercises you were accustomed to prior to pregnancy.  Later in your pregnancy you will most likely notice you have difficulty with activities requiring balance like riding a bicycle.  It is important that you listen to your body and avoid activities that put you at a higher risk of falling.  Adequate rest and staying well hydrated are a must!  If you have questions about the safety of specific activities ask your provider.    Exposure to Children with illness: Try to avoid obvious exposure; report any symptoms to us when noted,  If you have chicken pos, red measles or mumps, you should be immune to these diseases.   Please do not take any vaccines while pregnant unless you have checked with your OB provider.  Fetal Movement: After 28 weeks we recommend you do "kick counts" twice daily.  Lie or sit down in a calm quiet environment and  count your baby movements "kicks".  You should feel your baby at least 10 times per hour.  If you have not felt 10 kicks within the first hour get up, walk around and have something sweet to eat or drink then repeat for an additional hour.  If count remains less than 10 per hour notify your provider.  Fumigating: Follow your pest control agent's advice as to how long to stay out of your home.  Ventilate the area well before re-entering.  Hemorrhoids:   Most over-the-counter preparations can be used during pregnancy.  Check your medication to see what is safe to use.  It is important to use a stool softener or fiber in your diet and to drink lots of liquids.  If hemorrhoids seem to be getting worse please call the office.   Hot Tubs:  Hot tubs Jacuzzis and saunas are not recommended while pregnant.  These increase your internal body temperature and should be avoided.  Intercourse:  Sexual intercourse is safe during pregnancy as long as you are comfortable, unless otherwise advised by your provider.  Spotting may occur after intercourse; report any bright red bleeding that is heavier than spotting.  Labor:  If you know that you are in labor, please go to the hospital.  If you are unsure, please call the office and let us help you decide what to do.  Lifting, straining, etc:  If your job requires heavy   lifting or straining please check with your provider for any limitations.  Generally, you should not lift items heavier than that you can lift simply with your hands and arms (no back muscles)  Painting:  Paint fumes do not harm your pregnancy, but may make you ill and should be avoided if possible.  Latex or water based paints have less odor than oils.  Use adequate ventilation while painting.  Permanents & Hair Color:  Chemicals in hair dyes are not recommended as they cause increase hair dryness which can increase hair loss during pregnancy.  " Highlighting" and permanents are allowed.  Dye may be  absorbed differently and permanents may not hold as well during pregnancy.  Sunbathing:  Use a sunscreen, as skin burns easily during pregnancy.  Drink plenty of fluids; avoid over heating.  Tanning Beds:  Because their possible side effects are still unknown, tanning beds are not recommended.  Ultrasound Scans:  Routine ultrasounds are performed at approximately 20 weeks.  You will be able to see your baby's general anatomy an if you would like to know the gender this can usually be determined as well.  If it is questionable when you conceived you may also receive an ultrasound early in your pregnancy for dating purposes.  Otherwise ultrasound exams are not routinely performed unless there is a medical necessity.  Although you can request a scan we ask that you pay for it when conducted because insurance does not cover " patient request" scans.  Work: If your pregnancy proceeds without complications you may work until your due date, unless your physician or employer advises otherwise.  Round Ligament Pain/Pelvic Discomfort:  Sharp, shooting pains not associated with bleeding are fairly common, usually occurring in the second trimester of pregnancy.  They tend to be worse when standing up or when you remain standing for long periods of time.  These are the result of pressure of certain pelvic ligaments called "round ligaments".  Rest, Tylenol and heat seem to be the most effective relief.  As the womb and fetus grow, they rise out of the pelvis and the discomfort improves.  Please notify the office if your pain seems different than that described.  It may represent a more serious condition.   

## 2019-03-27 ENCOUNTER — Inpatient Hospital Stay (HOSPITAL_COMMUNITY)
Admission: AD | Admit: 2019-03-27 | Discharge: 2019-03-27 | Disposition: A | Discharge status: Home / Self Care | Payer: BC Managed Care – PPO | Attending: Obstetrics & Gynecology | Admitting: Obstetrics & Gynecology

## 2019-03-27 ENCOUNTER — Other Ambulatory Visit: Payer: Self-pay

## 2019-03-27 ENCOUNTER — Encounter (HOSPITAL_COMMUNITY): Payer: Self-pay

## 2019-03-27 DIAGNOSIS — O98811 Other maternal infectious and parasitic diseases complicating pregnancy, first trimester: Secondary | ICD-10-CM | POA: Insufficient documentation

## 2019-03-27 DIAGNOSIS — B3731 Acute candidiasis of vulva and vagina: Secondary | ICD-10-CM

## 2019-03-27 DIAGNOSIS — O99211 Obesity complicating pregnancy, first trimester: Secondary | ICD-10-CM | POA: Insufficient documentation

## 2019-03-27 DIAGNOSIS — Z3A09 9 weeks gestation of pregnancy: Secondary | ICD-10-CM | POA: Insufficient documentation

## 2019-03-27 DIAGNOSIS — B373 Candidiasis of vulva and vagina: Secondary | ICD-10-CM | POA: Diagnosis not present

## 2019-03-27 DIAGNOSIS — O26851 Spotting complicating pregnancy, first trimester: Secondary | ICD-10-CM | POA: Diagnosis present

## 2019-03-27 LAB — WET PREP, GENITAL
Clue Cells Wet Prep HPF POC: NONE SEEN
Sperm: NONE SEEN
Trich, Wet Prep: NONE SEEN

## 2019-03-27 LAB — URINALYSIS, ROUTINE W REFLEX MICROSCOPIC
Bilirubin Urine: NEGATIVE
Glucose, UA: NEGATIVE mg/dL
Hgb urine dipstick: NEGATIVE
Ketones, ur: NEGATIVE mg/dL
Nitrite: NEGATIVE
Protein, ur: 30 mg/dL — AB
Specific Gravity, Urine: 1.027 (ref 1.005–1.030)
pH: 7 (ref 5.0–8.0)

## 2019-03-27 MED ORDER — TERCONAZOLE 0.8 % VA CREA: 1.0000 | TOPICAL_CREAM | Freq: Every day | VAGINAL | 0 refills | Status: DC

## 2019-03-27 NOTE — Discharge Instructions (Signed)
Vaginal Yeast infection, Adult    Vaginal yeast infection is a condition that causes vaginal discharge as well as soreness, swelling, and redness (inflammation) of the vagina. This is a common condition. Some women get this infection frequently.  What are the causes?  This condition is caused by a change in the normal balance of the yeast (candida) and bacteria that live in the vagina. This change causes an overgrowth of yeast, which causes the inflammation.  What increases the risk?  The condition is more likely to develop in women who:   Take antibiotic medicines.   Have diabetes.   Take birth control pills.   Are pregnant.   Douche often.   Have a weak body defense system (immune system).   Have been taking steroid medicines for a long time.   Frequently wear tight clothing.  What are the signs or symptoms?  Symptoms of this condition include:   White, thick, creamy vaginal discharge.   Swelling, itching, redness, and irritation of the vagina. The lips of the vagina (vulva) may be affected as well.   Pain or a burning feeling while urinating.   Pain during sex.  How is this diagnosed?  This condition is diagnosed based on:   Your medical history.   A physical exam.   A pelvic exam. Your health care provider will examine a sample of your vaginal discharge under a microscope. Your health care provider may send this sample for testing to confirm the diagnosis.  How is this treated?  This condition is treated with medicine. Medicines may be over-the-counter or prescription. You may be told to use one or more of the following:   Medicine that is taken by mouth (orally).   Medicine that is applied as a cream (topically).   Medicine that is inserted directly into the vagina (suppository).  Follow these instructions at home:    Lifestyle   Do not have sex until your health care provider approves. Tell your sex partner that you have a yeast infection. That person should go to his or her health care  provider and ask if they should also be treated.   Do not wear tight clothes, such as pantyhose or tight pants.   Wear breathable cotton underwear.  General instructions   Take or apply over-the-counter and prescription medicines only as told by your health care provider.   Eat more yogurt. This may help to keep your yeast infection from returning.   Do not use tampons until your health care provider approves.   Try taking a sitz bath to help with discomfort. This is a warm water bath that is taken while you are sitting down. The water should only come up to your hips and should cover your buttocks. Do this 3-4 times per day or as told by your health care provider.   Do not douche.   If you have diabetes, keep your blood sugar levels under control.   Keep all follow-up visits as told by your health care provider. This is important.  Contact a health care provider if:   You have a fever.   Your symptoms go away and then return.   Your symptoms do not get better with treatment.   Your symptoms get worse.   You have new symptoms.   You develop blisters in or around your vagina.   You have blood coming from your vagina and it is not your menstrual period.   You develop pain in your abdomen.  Summary     Vaginal yeast infection is a condition that causes discharge as well as soreness, swelling, and redness (inflammation) of the vagina.   This condition is treated with medicine. Medicines may be over-the-counter or prescription.   Take or apply over-the-counter and prescription medicines only as told by your health care provider.   Do not douche. Do not have sex or use tampons until your health care provider approves.   Contact a health care provider if your symptoms do not get better with treatment or your symptoms go away and then return.  This information is not intended to replace advice given to you by your health care provider. Make sure you discuss any questions you have with your health care  provider.  Document Released: 07/16/2005 Document Revised: 02/22/2018 Document Reviewed: 02/22/2018  Elsevier Interactive Patient Education  2019 [employer]

## 2019-03-27 NOTE — MAU Note (Signed)
Maureen Lewis is a 23 y.o. at [redacted]w[redacted]d here in MAU reporting: for the past 2 days she has been having sharp pains and this morning she saw some spotting. No recent IC  Onset of complaint: a few days  Pain score: 6/10  Vitals:   03/27/19 0814  BP: 139/71  Pulse: 84  Resp: 20  Temp: 98.4 F (36.9 C)  SpO2: 99%      Lab orders placed from triage: UA

## 2019-03-27 NOTE — MAU Provider Note (Signed)
Chief Complaint: Abdominal Pain and Vaginal Bleeding   First Provider Initiated Contact with Patient 03/27/19 [phone]     SUBJECTIVE HPI: Maureen Lewis is a 23 y.o. G1P0000 at [redacted]w[redacted]d who [facility] reporting abdominal pain & vaginal spotting. Reports intermittent sharp pains last night. This morning noticed pink spotting on toilet paper. Denies n/v/d, dysuria, vaginal discharge, or recent intercourse.  Had ultrasound confirming IUP in the office last month (per epic).   Location: abdomen Quality: sharp Severity: 6/10 on pain scale Duration: 2 days Timing: intermittent Modifying factors: none Associated signs and symptoms: vaginal spotting  Past Medical History:  Diagnosis Date  . Abnormal uterine bleeding   . Acid reflux   . Amenorrhea   . Anemia    no current med.  . Complication of anesthesia    states had to keep giving her anesthesia during EGD  . Constipation   . Finger mass, left 03/2017   middle finger  . Irritable bowel syndrome (IBS)   . Morbid obesity with body mass index (BMI) of 45.0 to 49.9 in adult [facility])   . Plantar fasciitis, bilateral   . Polycystic ovary syndrome   . Shortness of breath   . Sleep apnea    no CPAP use   OB History  Gravida Para Term Preterm AB Living  1 0 0 0 0 0  SAB TAB Ectopic Multiple Live Births  0 0 0 0 0    # Outcome Date GA Lbr Len/2nd Weight Sex Delivery Anes PTL Lv  1 Current            Past Surgical History:  Procedure Laterality Date  . COLONOSCOPY WITH PROPOFOL  10/07/2016  . ESOPHAGOGASTRODUODENOSCOPY  12/21/2015  . EXCISION MASS UPPER EXTREMETIES Left 04/21/2017   Procedure: EXCISION MASS LEFT MIDDLE FINGER;  Surgeon: Daryll Brod, MD;  Location: [facility];  Service: Orthopedics;  Laterality: Left;   Social History   Socioeconomic History  . Marital status: Single    Spouse name: Not on file  . Number of children: Not on file  . Years of education: Not on file  . Highest education  level: Not on file  Occupational History  . Occupation: Chemical engineer: [employer]  . Financial resource strain: Not on file  . Food insecurity:    Worry: Not on file    Inability: Not on file  . Transportation needs:    Medical: Not on file    Non-medical: Not on file  Tobacco Use  . Smoking status: Never Smoker  . Smokeless tobacco: Never Used  Substance and Sexual Activity  . Alcohol use: Yes    Comment: social   . Drug use: No  . Sexual activity: Yes    Birth control/protection: Condom  Lifestyle  . Physical activity:    Days per week: Not on file    Minutes per session: Not on file  . Stress: Not on file  Relationships  . Social connections:    Talks on phone: Not on file    Gets together: Not on file    Attends religious service: Not on file    Active member of club or organization: Not on file    Attends meetings of clubs or organizations: Not on file    Relationship status: Not on file  . Intimate partner violence:    Fear of current or ex partner: Not on file    Emotionally abused: Not on file  Physically abused: Not on file    Forced sexual activity: Not on file  Other Topics Concern  . Not on file  Social History Narrative   Lives with mom and cousin, a grandmother in [place] also helps care for her.       She is in nursing school.    Family History  Problem Relation Age of Onset  . Diabetes Maternal Aunt   . Hypertension Maternal Uncle   . Depression Maternal Grandfather   . Diabetes Maternal Grandfather   . Hypertension Maternal Grandfather   . Arthritis Mother   . High blood pressure Mother   . Depression Mother   . Sleep apnea Mother   . Obesity Mother    No current facility-administered medications on file prior to encounter.    Current Outpatient Medications on File Prior to Encounter  Medication Sig Dispense Refill  . acetaminophen (TYLENOL) 325 MG tablet Take 650 mg by mouth every 6 (six) hours as needed.     Marland Kitchen buPROPion (WELLBUTRIN SR) 150 MG 12 hr tablet Take 1 tablet (150 mg total) by mouth daily. (Patient not taking: Reported on 03/03/2019) 30 tablet 0  . loratadine (CLARITIN) 10 MG tablet Take 10 mg by mouth daily as needed for allergies.     . metFORMIN (GLUCOPHAGE) 500 MG tablet Take 1 tablet (500 mg total) by mouth 2 (two) times daily with a meal. (Patient not taking: Reported on 03/03/2019) 60 tablet 0   Allergies  Allergen Reactions  . Bee Pollen Hives, Shortness Of Breath and Swelling  . Bee Venom Hives, Shortness Of Breath and Swelling  . Hydrocodone-Acetaminophen Anaphylaxis  . Peach Flavor Hives, Shortness Of Breath and Other (See Comments)    SWELLING OF MOUTH  . Pollen Extract Hives, Shortness Of Breath and Swelling    I have reviewed patient's Past Medical Hx, Surgical Hx, Family Hx, Social Hx, medications and allergies.   Review of Systems  Constitutional: Negative.   Gastrointestinal: Positive for abdominal pain. Negative for constipation, diarrhea, nausea and vomiting.  Genitourinary: Positive for vaginal bleeding. Negative for dysuria and vaginal discharge.    OBJECTIVE Patient Vitals for the past 24 hrs:  BP Temp Temp src Pulse Resp SpO2  03/27/19 0814 139/71 98.4 F (36.9 C) Oral 84 20 99 %   Constitutional: Well-developed, well-nourished female in no acute distress.  Cardiovascular: normal rate & rhythm, no murmur Respiratory: normal rate and effort. Lung sounds clear throughout GI: Abd soft, non-tender, Pos BS x 4. No guarding or rebound tenderness MS: Extremities nontender, no edema, normal ROM Neurologic: Alert and oriented x 4.  GU:     SPECULUM EXAM: NEFG, vaginal tissue erythematous. Moderate amount of clumpy green tinged discharge. No blood. Cervix pink/smooth.   BIMANUAL: No CMT. cervix closed   Pt informed that the ultrasound is considered a limited OB ultrasound and is not intended to be a complete ultrasound exam.  Patient also informed that the  ultrasound is not being completed with the intent of assessing for fetal or placental anomalies or any pelvic abnormalities.  Explained that the purpose of today's ultrasound is to assess for  viability.  Patient acknowledges the purpose of the exam and the limitations of the study.   Live IUP with FHR 169 bpm   LAB RESULTS Results for orders placed or performed during the hospital encounter of 03/27/19 (from the past 24 hour(s))  Urinalysis, Routine w reflex microscopic     Status: Abnormal   Collection Time: 03/27/19  8:06 AM  Result Value Ref Range   Color, Urine YELLOW YELLOW   APPearance HAZY (A) CLEAR   Specific Gravity, Urine 1.027 1.005 - 1.030   pH 7.0 5.0 - 8.0   Glucose, UA NEGATIVE NEGATIVE mg/dL   Hgb urine dipstick NEGATIVE NEGATIVE   Bilirubin Urine NEGATIVE NEGATIVE   Ketones, ur NEGATIVE NEGATIVE mg/dL   Protein, ur 30 (A) NEGATIVE mg/dL   Nitrite NEGATIVE NEGATIVE   Leukocytes,Ua LARGE (A) NEGATIVE   RBC / HPF 0-5 0 - 5 RBC/hpf   WBC, UA 6-10 0 - 5 WBC/hpf   Bacteria, UA RARE (A) NONE SEEN   Squamous Epithelial / LPF 0-5 0 - 5  Wet prep, genital     Status: Abnormal   Collection Time: 03/27/19  8:50 AM  Result Value Ref Range   Yeast Wet Prep HPF POC PRESENT (A) NONE SEEN   Trich, Wet Prep NONE SEEN NONE SEEN   Clue Cells Wet Prep HPF POC NONE SEEN NONE SEEN   WBC, Wet Prep HPF POC MANY (A) NONE SEEN   Sperm NONE SEEN     IMAGING No results found.  MAU COURSE Orders Placed This Encounter  Procedures  . Wet prep, genital  . Culture, OB Urine  . Urinalysis, Routine w reflex microscopic  . Discharge patient   Meds ordered this encounter  Medications  . terconazole (TERAZOL 3) 0.8 % vaginal cream    Sig: Place 1 applicator vaginally at bedtime.    Dispense:  20 g    Refill:  0    Order Specific Question:   Supervising Provider    Answer:   Woodroe Mode [[id number]]    MDM Per epic, IUP was confirmed on ultrasound in office last month RH  positive  Bedside ultrasound shows live iup (see documentation above)  GC/CT & wet prep collected. No blood on exam. Exam consistent with yeast.   U/a with large leuks. Denies dysuria, hematuria, or frequency. Will send for culture.   ASSESSMENT 1. Vaginal yeast infection   2. [redacted] weeks gestation of pregnancy     PLAN Discharge home in stable condition. SAB precautions Rx terazol GC/CT & urine culture pending  Allergies as of 03/27/2019      Reactions   Bee Pollen Hives, Shortness Of Breath, Swelling   Bee Venom Hives, Shortness Of Breath, Swelling   Hydrocodone-acetaminophen Anaphylaxis   Peach Flavor Hives, Shortness Of Breath, Other (See Comments)   SWELLING OF MOUTH   Pollen Extract Hives, Shortness Of Breath, Swelling      Medication List    STOP taking these medications   buPROPion 150 MG 12 hr tablet Commonly known as:  Wellbutrin SR   metFORMIN 500 MG tablet Commonly known as:  GLUCOPHAGE     TAKE these medications   acetaminophen 325 MG tablet Commonly known as:  TYLENOL Take 650 mg by mouth every 6 (six) hours as needed.   loratadine 10 MG tablet Commonly known as:  CLARITIN Take 10 mg by mouth daily as needed for allergies.   terconazole 0.8 % vaginal cream Commonly known as:  Terazol 3 Place 1 applicator vaginally at bedtime.        Jorje Guild, NP 03/27/2019  9:29 AM

## 2019-03-28 LAB — CULTURE, OB URINE: Culture: <10000 — AB

## 2019-03-28 LAB — GC/CHLAMYDIA PROBE AMP (~~LOC~~) NOT AT ARMC
Chlamydia: NEGATIVE
Neisseria Gonorrhea: NEGATIVE

## 2019-04-06 ENCOUNTER — Encounter: Payer: Self-pay | Admitting: Certified Nurse Midwife

## 2019-04-06 ENCOUNTER — Ambulatory Visit (INDEPENDENT_AMBULATORY_CARE_PROVIDER_SITE_OTHER): Payer: BC Managed Care – PPO | Admitting: Certified Nurse Midwife

## 2019-04-06 ENCOUNTER — Other Ambulatory Visit: Payer: Self-pay

## 2019-04-06 VITALS — BP 114/73 | HR 92 | Wt >= 6400 oz

## 2019-04-06 DIAGNOSIS — O99211 Obesity complicating pregnancy, first trimester: Secondary | ICD-10-CM | POA: Diagnosis not present

## 2019-04-06 DIAGNOSIS — Z113 Encounter for screening for infections with a predominantly sexual mode of transmission: Secondary | ICD-10-CM

## 2019-04-06 DIAGNOSIS — O99210 Obesity complicating pregnancy, unspecified trimester: Secondary | ICD-10-CM | POA: Insufficient documentation

## 2019-04-06 DIAGNOSIS — Z3A10 10 weeks gestation of pregnancy: Secondary | ICD-10-CM | POA: Diagnosis not present

## 2019-04-06 DIAGNOSIS — O219 Vomiting of pregnancy, unspecified: Secondary | ICD-10-CM | POA: Diagnosis not present

## 2019-04-06 DIAGNOSIS — Z3400 Encounter for supervision of normal first pregnancy, unspecified trimester: Secondary | ICD-10-CM

## 2019-04-06 HISTORY — DX: Obesity complicating pregnancy, unspecified trimester: O99.210

## 2019-04-06 HISTORY — DX: Encounter for supervision of normal first pregnancy, unspecified trimester: Z34.00

## 2019-04-06 LAB — POCT URINALYSIS DIPSTICK OB
Blood, UA: NEGATIVE
Leukocytes, UA: NEGATIVE
Nitrite, UA: NEGATIVE
Spec Grav, UA: 1.015 (ref 1.010–1.025)
pH, UA: 7.5 (ref 5.0–8.0)

## 2019-04-06 MED ORDER — ASPIRIN EC 81 MG PO TBEC: 81.0000 mg | DELAYED_RELEASE_TABLET | Freq: Every day | ORAL | 0 refills | Status: DC

## 2019-04-06 MED ORDER — BLOOD PRESSURE MONITORING KIT: 1.0000 | PACK | 0 refills | Status: DC

## 2019-04-06 MED ORDER — ONDANSETRON 4 MG PO TBDP: 4.0000 mg | ORAL_TABLET | Freq: Three times a day (TID) | ORAL | 1 refills | Status: DC | PRN

## 2019-04-06 NOTE — Progress Notes (Signed)
History:   Maureen Lewis is a 23 y.o. G1P0000 at 75w5dby LMP being seen today for her first obstetrical visit.  Her obstetrical history is significant for morbid obesity. Patient does intend to breast feed. Pregnancy history fully reviewed.  Patient reports nausea.      HISTORY: OB History  Gravida Para Term Preterm AB Living  1 0 0 0 0 0  SAB TAB Ectopic Multiple Live Births  0 0 0 0 0    # Outcome Date GA Lbr Len/2nd Weight Sex Delivery Anes PTL Lv  1 Current             Patient has never had pap smear, <23 yo, needs postpartum as she will be over 89  Past Medical History:  Diagnosis Date  . Abnormal uterine bleeding   . Acid reflux   . Amenorrhea   . Anemia    no current med.  . Complication of anesthesia    states had to keep giving her anesthesia during EGD  . Constipation   . Finger mass, left 03/2017   middle finger  . Irritable bowel syndrome (IBS)   . Morbid obesity with body mass index (BMI) of 45.0 to 49.9 in adult ([facility]   . Plantar fasciitis, bilateral   . Polycystic ovary syndrome   . Shortness of breath   . Sleep apnea    no CPAP use   Past Surgical History:  Procedure Laterality Date  . COLONOSCOPY WITH PROPOFOL  10/07/2016  . ESOPHAGOGASTRODUODENOSCOPY  12/21/2015  . EXCISION MASS UPPER EXTREMETIES Left 04/21/2017   Procedure: EXCISION MASS LEFT MIDDLE FINGER;  Surgeon: KDaryll Brod MD;  Location: [facility]  Service: Orthopedics;  Laterality: Left;   Family History  Problem Relation Age of Onset  . Diabetes Maternal Aunt   . Hypertension Maternal Uncle   . Depression Maternal Grandfather   . Diabetes Maternal Grandfather   . Hypertension Maternal Grandfather   . Arthritis Mother   . High blood pressure Mother   . Depression Mother   . Sleep apnea Mother   . Obesity Mother    Social History   Tobacco Use  . Smoking status: Never Smoker  . Smokeless tobacco: Never Used  Substance Use Topics  . Alcohol use: Yes   Comment: social   . Drug use: No   Allergies  Allergen Reactions  . Bee Pollen Hives, Shortness Of Breath and Swelling  . Bee Venom Hives, Shortness Of Breath and Swelling  . Hydrocodone-Acetaminophen Anaphylaxis  . Peach Flavor Hives, Shortness Of Breath and Other (See Comments)    SWELLING OF MOUTH  . Pollen Extract Hives, Shortness Of Breath and Swelling   Current Outpatient Medications on File Prior to Visit  Medication Sig Dispense Refill  . acetaminophen (TYLENOL) 325 MG tablet Take 650 mg by mouth every 6 (six) hours as needed.    . loratadine (CLARITIN) 10 MG tablet Take 10 mg by mouth daily as needed for allergies.     . Prenatal Vit-Fe Fumarate-FA (PRENATAL VITAMINS PO) Take by mouth.    . terconazole (TERAZOL 3) 0.8 % vaginal cream Place 1 applicator vaginally at bedtime. 20 g 0   No current facility-administered medications on file prior to visit.     Review of Systems Pertinent items noted in HPI and remainder of comprehensive ROS otherwise negative. Physical Exam:   Vitals:   04/06/19 1000  BP: 114/73  Pulse: 92  Weight: (!) 407 lb 1.3 oz (184.7  kg)   Fetal Heart Rate (bpm): [name]- 163 System: General: well-developed, morbid obese female in no acute distress   Breasts:  normal appearance, no masses or tenderness bilaterally   Skin: normal coloration and turgor, no rashes   Neurologic: oriented, normal, negative, normal mood   Extremities: normal strength, tone, and muscle mass, ROM of all joints is normal   HEENT PERRLA, extraocular movement intact and sclera clear   Mouth/Teeth mucous membranes moist, pharynx normal without lesions and dental hygiene good   Neck supple and no masses   Cardiovascular: regular rate and rhythm   Respiratory:  no respiratory distress, normal breath sounds   Abdomen: soft, non-tender; bowel sounds normal; no masses,  no organomegaly  Bedside Ultrasound for FHR check: Patient informed that the ultrasound is considered a limited  obstetric ultrasound and is not intended to be a complete ultrasound exam.  Patient also informed that the ultrasound is not being completed with the intent of assessing for fetal or placental anomalies or any pelvic abnormalities.  Explained that the purpose of today's ultrasound is to assess for fetal heart rate.  Patient acknowledges the purpose of the exam and the limitations of the study.      FHR 163 Assessment:    Pregnancy: G1P0000 Patient Active Problem List   Diagnosis Date Noted  . Supervision of high risk pregnancy, unspecified, first trimester 04/06/2019  . Obesity during pregnancy, antepartum 04/06/2019  . Nausea and vomiting during pregnancy 04/06/2019  . Bacterial vaginosis 02/25/2019  . Pregnancy with uncertain fetal viability 02/25/2019  . Vitamin D deficiency 09/16/2017  . Prediabetes 09/16/2017  . Depression 09/16/2017  . Class 3 severe obesity with serious comorbidity and body mass index (BMI) of 50.0 to 59.9 in adult ([place]) 09/16/2017  . Shortness of breath on exertion 08/13/2017  . PCOS (polycystic ovarian syndrome) 08/13/2017  . Mass 05/06/2017  . Vertigo 10/06/2016  . Anemia 09/26/2016  . Pseudotumor cerebri syndrome 08/28/2016  . Obesity hypoventilation syndrome ([place]) 08/28/2016  . Benign paroxysmal positional vertigo due to bilateral vestibular disorder 08/28/2016  . OSA (obstructive sleep apnea) 08/28/2016  . Hypersomnia with sleep apnea 08/28/2016  . Female hirsutism 08/28/2016  . Back pain 04/16/2015  . Amenorrhea 04/13/2015  . Sexually active at young age 87/04/2015  . GERD (gastroesophageal reflux disease) 02/24/2015  . Constipation 10/02/2014  . Shoulder pain, left 01/29/2014     Plan:    1. Supervision of normal first pregnancy, antepartum - Welcomed to practice and introduced self to patient  - Routine prenatal care  - COVID 19 precautions discussed  - Anticipatory guidance on upcoming appointments including Mychart visits and in person  appointments  - patient request genetic screening  - [facility] AMB BABYSCRIPTS SCHEDULE OPTIMIZATION - Culture, OB Urine - Obstetric Panel, Including HIV - Hemoglobinopathy Evaluation - Blood Pressure Monitoring KIT; 1 kit by Does not apply route once a week.  Dispense: 1 kit; Refill: 0 - Genetic Screening - Urine cytology ancillary only([place]) - POC Urinalysis Dipstick OB  2. Obesity during pregnancy, antepartum - Morbid obesity, BMI 54.10 - Educated and discussed recommended weight gain during pregnancy  - HgB A1c - aspirin EC 81 MG tablet; Take 1 tablet (81 mg total) by mouth daily. Take after 12 weeks for prevention of preeclampsia later in pregnancy-do not start until 04/17/19  Dispense: 300 tablet; Refill: 0 - POC Urinalysis Dipstick OB  3. Nausea and vomiting during pregnancy - Patient reports occasional nausea during pregnancy, denies vomiting- request medication  for nausea  - ondansetron (ZOFRAN ODT) 4 MG disintegrating tablet; Take 1 tablet (4 mg total) by mouth every 8 (eight) hours as needed for nausea or vomiting.  Dispense: 30 tablet; Refill: 1 - POC Urinalysis Dipstick OB   Initial labs drawn. Continue prenatal vitamins. Genetic Screening discussed, NIPS: requested and ordered Ultrasound discussed; fetal anatomic survey: requested. Problem list reviewed and updated. The nature of [facility] with multiple MDs and other Advanced Practice Providers was explained to patient; also emphasized that residents, students are part of our team. Routine obstetric precautions reviewed. Return in about 4 weeks (around 05/04/2019) for ROB-mychart visit .     [name], [place] for [employer], [place]

## 2019-04-06 NOTE — Patient Instructions (Signed)
Safe Medications in Pregnancy   Acne: Benzoyl Peroxide Salicylic Acid  Backache/Headache: Tylenol: 2 regular strength every 4 hours OR              2 Extra strength every 6 hours  Colds/Coughs/Allergies: Benadryl (alcohol free) 25 mg every 6 hours as needed Breath right strips Claritin Cepacol throat lozenges Chloraseptic throat spray Cold-Eeze- up to three times per day Cough drops, alcohol free Flonase (by prescription only) Guaifenesin Mucinex Robitussin DM (plain only, alcohol free) Saline nasal spray/drops Sudafed (pseudoephedrine) & Actifed ** use only after [redacted] weeks gestation and if you do not have high blood pressure Tylenol Vicks Vaporub Zinc lozenges Zyrtec   Constipation: Colace Ducolax suppositories Fleet enema Glycerin suppositories Metamucil Milk of magnesia Miralax Senokot Smooth move tea  Diarrhea: Kaopectate Imodium A-D  *NO pepto Bismol  Hemorrhoids: Anusol Anusol HC Preparation H Tucks  Indigestion: Tums Maalox Mylanta Zantac  Pepcid  Insomnia: Benadryl (alcohol free) 25mg  every 6 hours as needed Tylenol PM Unisom, no Gelcaps  Leg Cramps: Tums MagGel  Nausea/Vomiting:  Bonine Dramamine Emetrol Ginger extract Sea bands Meclizine  Nausea medication to take during pregnancy:  Unisom (doxylamine succinate 25 mg tablets) Take one tablet daily at bedtime. If symptoms are not adequately controlled, the dose can be increased to a maximum recommended dose of two tablets daily (1/2 tablet in the morning, 1/2 tablet mid-afternoon and one at bedtime). Vitamin B6 100mg  tablets. Take one tablet twice a day (up to 200 mg per day).  Skin Rashes: Aveeno products Benadryl cream or 25mg  every 6 hours as needed Calamine Lotion 1% cortisone cream  Yeast infection: Gyne-lotrimin 7 Monistat 7   **If taking multiple medications, please check labels to avoid duplicating the same active ingredients **take medication as directed on  the label ** Do not exceed 4000 mg of tylenol in 24 hours **Do not take medications that contain aspirin or ibuprofen     First Trimester of Pregnancy  The first trimester of pregnancy is from week 1 until the end of week 13 (months 1 through 3). During this time, your baby will begin to develop inside you. At 6-8 weeks, the eyes and face are formed, and the heartbeat can be seen on ultrasound. At the end of 12 weeks, all the baby's organs are formed. Prenatal care is all the medical care you receive before the birth of your baby. Make sure you get good prenatal care and follow all of your doctor's instructions. Follow these instructions at home: Medicines  Take over-the-counter and prescription medicines only as told by your doctor. Some medicines are safe and some medicines are not safe during pregnancy.  Take a prenatal vitamin that contains at least 600 micrograms (mcg) of folic acid.  If you have trouble pooping (constipation), take medicine that will make your stool soft (stool softener) if your doctor approves. Eating and drinking   Eat regular, healthy meals.  Your doctor will tell you the amount of weight gain that is right for you.  Avoid raw meat and uncooked cheese.  If you feel sick to your stomach (nauseous) or throw up (vomit): ? Eat 4 or 5 small meals a day instead of 3 large meals. ? Try eating a few soda crackers. ? Drink liquids between meals instead of during meals.  To prevent constipation: ? Eat foods that are high in fiber, like fresh fruits and vegetables, whole grains, and beans. ? Drink enough fluids to keep your pee (urine) clear or pale yellow.  Activity  Exercise only as told by your doctor. Stop exercising if you have cramps or pain in your lower belly (abdomen) or low back.  Do not exercise if it is too hot, too humid, or if you are in a place of great height (high altitude).  Try to avoid standing for long periods of time. Move your legs often  if you must stand in one place for a long time.  Avoid heavy lifting.  Wear low-heeled shoes. Sit and stand up straight.  You can have sex unless your doctor tells you not to. Relieving pain and discomfort  Wear a good support bra if your breasts are sore.  Take warm water baths (sitz baths) to soothe pain or discomfort caused by hemorrhoids. Use hemorrhoid cream if your doctor says it is okay.  Rest with your legs raised if you have leg cramps or low back pain.  If you have puffy, bulging veins (varicose veins) in your legs: ? Wear support hose or compression stockings as told by your doctor. ? Raise (elevate) your feet for 15 minutes, 3-4 times a day. ? Limit salt in your food. Prenatal care  Schedule your prenatal visits by the twelfth week of pregnancy.  Write down your questions. Take them to your prenatal visits.  Keep all your prenatal visits as told by your doctor. This is important. Safety  Wear your seat belt at all times when driving.  Make a list of emergency phone numbers. The list should include numbers for family, friends, the hospital, and police and fire departments. General instructions  Ask your doctor for a referral to a local prenatal class. Begin classes no later than at the start of month 6 of your pregnancy.  Ask for help if you need counseling or if you need help with nutrition. Your doctor can give you advice or tell you where to go for help.  Do not use hot tubs, steam rooms, or saunas.  Do not douche or use tampons or scented sanitary pads.  Do not cross your legs for long periods of time.  Avoid all herbs and alcohol. Avoid drugs that are not approved by your doctor.  Do not use any tobacco products, including cigarettes, chewing tobacco, and electronic cigarettes. If you need help quitting, ask your doctor. You may get counseling or other support to help you quit.  Avoid cat litter boxes and soil used by cats. These carry germs that can  cause birth defects in the baby and can cause a loss of your baby (miscarriage) or stillbirth.  Visit your dentist. At home, brush your teeth with a soft toothbrush. Be gentle when you floss. Contact a doctor if:  You are dizzy.  You have mild cramps or pressure in your lower belly.  You have a nagging pain in your belly area.  You continue to feel sick to your stomach, you throw up, or you have watery poop (diarrhea).  You have a bad smelling fluid coming from your vagina.  You have pain when you pee (urinate).  You have increased puffiness (swelling) in your face, hands, legs, or ankles. Get help right away if:  You have a fever.  You are leaking fluid from your vagina.  You have spotting or bleeding from your vagina.  You have very bad belly cramping or pain.  You gain or lose weight rapidly.  You throw up blood. It may look like coffee grounds.  You are around people who have [name] measles, fifth disease,  or chickenpox.  You have a very bad headache.  You have shortness of breath.  You have any kind of trauma, such as from a fall or a car accident. Summary  The first trimester of pregnancy is from week 1 until the end of week 13 (months 1 through 3).  To take care of yourself and your unborn baby, you will need to eat healthy meals, take medicines only if your doctor tells you to do so, and do activities that are safe for you and your baby.  Keep all follow-up visits as told by your doctor. This is important as your doctor will have to ensure that your baby is healthy and growing well. This information is not intended to replace advice given to you by your health care provider. Make sure you discuss any questions you have with your health care provider. Document Released: 03/24/2008 Document Revised: 10/14/2016 Document Reviewed: 10/14/2016 Elsevier Interactive Patient Education  2019 [employer].

## 2019-04-07 LAB — GC/CHLAMYDIA PROBE AMP (~~LOC~~) NOT AT ARMC
Chlamydia: NEGATIVE
Neisseria Gonorrhea: NEGATIVE

## 2019-04-08 LAB — CULTURE, OB URINE

## 2019-04-08 LAB — URINE CULTURE, OB REFLEX

## 2019-04-16 LAB — OBSTETRIC PANEL, INCLUDING HIV
Antibody Screen: NEGATIVE
Basophils Absolute: 0 10*3/uL (ref 0.0–0.2)
Basos: 0 %
EOS (ABSOLUTE): 0 10*3/uL (ref 0.0–0.4)
Eos: 0 %
HIV Screen 4th Generation wRfx: NONREACTIVE
Hematocrit: 36.8 % (ref 34.0–46.6)
Hemoglobin: 12.1 g/dL (ref 11.1–15.9)
Hepatitis B Surface Ag: NEGATIVE
Immature Grans (Abs): 0 10*3/uL (ref 0.0–0.1)
Immature Granulocytes: 0 %
Lymphocytes Absolute: 1.6 10*3/uL (ref 0.7–3.1)
Lymphs: 22 %
MCH: 23 pg — ABNORMAL LOW (ref 26.6–33.0)
MCHC: 32.9 g/dL (ref 31.5–35.7)
MCV: 70 fL — ABNORMAL LOW (ref 79–97)
Monocytes Absolute: 0.5 10*3/uL (ref 0.1–0.9)
Monocytes: 6 %
Neutrophils Absolute: 5.1 10*3/uL (ref 1.4–7.0)
Neutrophils: 72 %
Platelets: 250 10*3/uL (ref 150–450)
RBC: 5.25 x10E6/uL (ref 3.77–5.28)
RDW: 14.8 % (ref 11.7–15.4)
RPR Ser Ql: NONREACTIVE
Rh Factor: POSITIVE
Rubella Antibodies, IGG: 12.8 index (ref >0.99)
WBC: 7.1 10*3/uL (ref 3.4–10.8)

## 2019-04-16 LAB — HEMOGLOBINOPATHY EVALUATION
Ferritin: 76 ng/mL (ref 15–150)
Hgb A2 Quant: 1 % — ABNORMAL LOW (ref 1.8–3.2)
Hgb A: 97.9 % (ref 96.4–98.8)
Hgb C: 0 %
Hgb F Quant: 0 % (ref 0.0–2.0)
Hgb S: 0 %
Hgb Solubility: NEGATIVE
Hgb Variant: 1.1 % — ABNORMAL HIGH

## 2019-04-16 LAB — HEMOGLOBIN A1C
Est. average glucose Bld gHb Est-mCnc: 120 mg/dL
Hgb A1c MFr Bld: 5.8 % — ABNORMAL HIGH (ref 4.8–5.6)

## 2019-04-16 LAB — ALPHA-THALASSEMIA

## 2019-04-20 ENCOUNTER — Other Ambulatory Visit: Payer: Self-pay

## 2019-04-20 DIAGNOSIS — Z3400 Encounter for supervision of normal first pregnancy, unspecified trimester: Secondary | ICD-10-CM

## 2019-04-20 NOTE — Progress Notes (Signed)
Referral to nutrition counseling per [facility]

## 2019-04-30 ENCOUNTER — Other Ambulatory Visit: Payer: Self-pay

## 2019-04-30 ENCOUNTER — Encounter (HOSPITAL_COMMUNITY): Payer: Self-pay | Admitting: *Deleted

## 2019-04-30 ENCOUNTER — Inpatient Hospital Stay (HOSPITAL_COMMUNITY)
Admission: AD | Admit: 2019-04-30 | Discharge: 2019-04-30 | Disposition: A | Discharge status: Home / Self Care | Payer: BC Managed Care – PPO | Attending: Obstetrics and Gynecology | Admitting: Obstetrics and Gynecology

## 2019-04-30 DIAGNOSIS — O99212 Obesity complicating pregnancy, second trimester: Secondary | ICD-10-CM | POA: Diagnosis not present

## 2019-04-30 DIAGNOSIS — Z3A14 14 weeks gestation of pregnancy: Secondary | ICD-10-CM | POA: Insufficient documentation

## 2019-04-30 DIAGNOSIS — O4692 Antepartum hemorrhage, unspecified, second trimester: Secondary | ICD-10-CM

## 2019-04-30 DIAGNOSIS — O209 Hemorrhage in early pregnancy, unspecified: Secondary | ICD-10-CM | POA: Diagnosis present

## 2019-04-30 DIAGNOSIS — Z3492 Encounter for supervision of normal pregnancy, unspecified, second trimester: Secondary | ICD-10-CM

## 2019-04-30 LAB — URINALYSIS, ROUTINE W REFLEX MICROSCOPIC
Bilirubin Urine: NEGATIVE
Glucose, UA: NEGATIVE mg/dL
Hgb urine dipstick: NEGATIVE
Ketones, ur: NEGATIVE mg/dL
Leukocytes,Ua: NEGATIVE
Nitrite: NEGATIVE
Protein, ur: NEGATIVE mg/dL
Specific Gravity, Urine: 1.029 (ref 1.005–1.030)
pH: 6 (ref 5.0–8.0)

## 2019-04-30 NOTE — MAU Note (Signed)
RN reviewed and provided handout of discharge instructions. Questions answered. Patient verbalizes understanding. Patient ambulatory at discharge. All belongings sent home with patient.

## 2019-04-30 NOTE — Discharge Instructions (Signed)
Preventing Injuries During Pregnancy °Trauma is the most common cause of injury and death in pregnant women. This can also result in serious harm to the baby or even death. °How can injuries affect my pregnancy? °Your baby is protected in the womb (uterus) by a sac filled with fluid (amniotic sac). Your baby can be harmed if there is a direct blow to your abdomen and pelvis. Trauma may be caused by: °· Falls. These are more common in the second and third trimester of pregnancy. °· Automobile accidents. °· Domestic violence or assault. °· Severe burns, such as from fire or electricity. °These injuries can result in: °· Tearing of your uterus. °· The placenta pulling away from the wall of the uterus (placental abruption). °· The amniotic sac breaking open (rupture of membranes). °· Blockage or decrease in the blood supply to your baby. °· Going into labor earlier than expected. °· Severe injuries to other parts of your body, such as your brain, spine, heart, lungs, or other organs. °Minor falls and low-impact automobile accidents do not usually harm your baby, even if they cause a little harm to you. °What can I do to lower my risk? °Safety °· Remove slippery rugs and loose objects on the floor. They increase your risk of tripping or slipping. °· Wear comfortable shoes that have a good grip on the sole. Do not wear high-heeled shoes. °· Always wear your seat belt properly when riding in a car. Use both the lap and shoulder belt, with the lap belt below your abdomen. Always practice safe driving. Do not ride on a motorcycle while pregnant. °Activity °· Avoid walking on wet or slippery floors. °· Do not participate in rough and violent activities or sports. °· Avoid high-risk situations and activities such as: °? Lifting heavy pots of boiling or hot liquids. °? Fixing electrical problems. °? Being near fires or starting fires. °General instructions °· Take over-the-counter and prescription medicines only as told by your  health care provider. °· Know your blood type and the father's blood type in case you develop vaginal bleeding or experience an injury for which a blood transfusion is needed. °· Spousal abuse can be a serious cause of trauma during pregnancy. If you are a victim of domestic violence or assault: °? Call your local emergency services (911 in the [place] °? Contact the National Domestic Violence Hotline for help and support. °When should I seek immediate medical care? °Get help right away if: °· You fall on your abdomen or experience any serious blow to your abdomen. °· You develop stiffness in your neck or pain after a fall or from other trauma. °· You develop a headache or vision problems after a fall or from other trauma. °· You do not feel the baby moving after a fall or trauma, or you feel that the baby is not moving as much as before the fall or trauma. °· You have been the victim of domestic violence or any other kind of physical attack. °· You have been in a car accident. °· You develop vaginal bleeding. °· You have fluid leaking from the vagina. °· You develop uterine contractions. Symptoms include pelvic cramping, pain, or serious low back pain. °· You become weak, faint, or have uncontrolled vomiting after trauma. °· You have a serious burn. This includes burns to the face, neck, hands, or genitals, or burns greater than the size of your palm anywhere else. °Summary °· Trauma is the most common cause of injury and death   in pregnant women and can also lead to injury or death of the baby.  Falls, automobile accidents, domestic violence or assault, and severe burns can injure you or your baby. Make sure to get medical help right away if you experience any of these during your pregnancy.  Take steps to prevent slips or falls in your home, such as avoiding slippery floors and removing loose rugs.  Always wear your seat belt properly when riding in a car. Practice safe driving. This information is not  intended to replace advice given to you by your health care provider. Make sure you discuss any questions you have with your health care provider. Document Released: 11/13/2004 Document Revised: 02/03/2019 Document Reviewed: 10/15/2016 Elsevier Patient Education  2020 [employer].

## 2019-04-30 NOTE — MAU Note (Signed)
Maureen Lewis is a 23 y.o. at [redacted]w[redacted]d here in MAU reporting: states she fell yesterday, landed on her left side, and throughout the day she started having back pain and side pain. Took tylenol at 0800 with no relief. Started seeing some spotting early this AM, states she sees blood when she wipes on the toilet paper. No abnormal vaginal discharge, no recent IC.  Onset of complaint: yesterday  Pain score: 6/10  Vitals:   04/30/19 1058  BP: (!) 141/72  Pulse: 91  Resp: 18  Temp: 98.7 F (37.1 C)  SpO2: 99%      Lab orders placed from triage: UA

## 2019-04-30 NOTE — MAU Provider Note (Signed)
Chief Complaint: Back Pain and Vaginal Bleeding   First Provider Initiated Contact with Patient 04/30/19 1150     SUBJECTIVE HPI: Maureen Lewis is a 23 y.o. G1P0000 at 32w1dwho presents to Maternity Admissions reporting vaginal bleeding. States she fell yesterday and landed on her left side. Since then has been sore in her back and side. This morning she noticed vaginal spotting. Reports pink/red spotting on toilet paper when she wipes. Does not have to wear a pad & is not passing clots. Denies abdominal pain. No dysuria, vaginal discharge, vaginal irritation, or recent intercourse.   Location: lefts side & back Quality: cramping, aching Severity: 6/10 on pain scale Duration: 1 day Timing: constant Modifying factors: not improved with tylenol Associated signs and symptoms: vaginal bleeding  Past Medical History:  Diagnosis Date  . Abnormal uterine bleeding   . Acid reflux   . Amenorrhea   . Anemia    no current med.  . Complication of anesthesia    states had to keep giving her anesthesia during EGD  . Constipation   . Finger mass, left 03/2017   middle finger  . Irritable bowel syndrome (IBS)   . Morbid obesity with body mass index (BMI) of 45.0 to 49.9 in adult ([facility]   . Plantar fasciitis, bilateral   . Polycystic ovary syndrome   . Shortness of breath   . Sleep apnea    no CPAP use   OB History  Gravida Para Term Preterm AB Living  1 0 0 0 0 0  SAB TAB Ectopic Multiple Live Births  0 0 0 0 0    # Outcome Date GA Lbr Len/2nd Weight Sex Delivery Anes PTL Lv  1 Current            Past Surgical History:  Procedure Laterality Date  . COLONOSCOPY WITH PROPOFOL  10/07/2016  . ESOPHAGOGASTRODUODENOSCOPY  12/21/2015  . EXCISION MASS UPPER EXTREMETIES Left 04/21/2017   Procedure: EXCISION MASS LEFT MIDDLE FINGER;  Surgeon: KDaryll Brod MD;  [facility]  Service: Orthopedics;  Laterality: Left;   Social History   Socioeconomic History  . Marital  status: Single    Spouse name: Not on file  . Number of children: Not on file  . Years of education: Not on file  . Highest education level: Not on file  Occupational History  . Occupation: sChemical engineer [employer] . Financial resource strain: Not on file  . Food insecurity    Worry: Not on file    Inability: Not on file  . Transportation needs    Medical: Not on file    Non-medical: Not on file  Tobacco Use  . Smoking status: Never Smoker  . Smokeless tobacco: Never Used  Substance and Sexual Activity  . Alcohol use: Yes    Comment: social   . Drug use: No  . Sexual activity: Yes    Birth control/protection: Pill  Lifestyle  . Physical activity    Days per week: Not on file    Minutes per session: Not on file  . Stress: Not on file  Relationships  . Social [place] phone: Not on file    Gets together: Not on file    Attends religious service: Not on file    Active member of club or organization: Not on file    Attends meetings of clubs or organizations: Not on file    Relationship  status: Not on file  . Intimate partner violence    Fear of current or ex partner: Not on file    Emotionally abused: Not on file    Physically abused: Not on file    Forced sexual activity: Not on file  Other Topics Concern  . Not on file  Social History Narrative   Lives with mom and cousin, a grandmother in [place] also helps care for her.       She is in nursing school.    Family History  Problem Relation Age of Onset  . Diabetes Maternal Aunt   . Hypertension Maternal Uncle   . Depression Maternal Grandfather   . Diabetes Maternal Grandfather   . Hypertension Maternal Grandfather   . Arthritis Mother   . High blood pressure Mother   . Depression Mother   . Sleep apnea Mother   . Obesity Mother    No current facility-administered medications on file prior to encounter.    Current Outpatient Medications on File Prior to  Encounter  Medication Sig Dispense Refill  . acetaminophen (TYLENOL) 325 MG tablet Take 650 mg by mouth every 6 (six) hours as needed.    Marland Kitchen aspirin EC 81 MG tablet Take 1 tablet (81 mg total) by mouth daily. Take after 12 weeks for prevention of preeclampsia later in pregnancy-do not start until 04/17/19 300 tablet 0  . Blood Pressure Monitoring KIT 1 kit by Does not apply route once a week. 1 kit 0  . loratadine (CLARITIN) 10 MG tablet Take 10 mg by mouth daily as needed for allergies.     Marland Kitchen ondansetron (ZOFRAN ODT) 4 MG disintegrating tablet Take 1 tablet (4 mg total) by mouth every 8 (eight) hours as needed for nausea or vomiting. 30 tablet 1  . Prenatal Vit-Fe Fumarate-FA (PRENATAL VITAMINS PO) Take by mouth.    . terconazole (TERAZOL 3) 0.8 % vaginal cream Place 1 applicator vaginally at bedtime. 20 g 0   Allergies  Allergen Reactions  . Bee Pollen Hives, Shortness Of Breath and Swelling  . Bee Venom Hives, Shortness Of Breath and Swelling  . Hydrocodone-Acetaminophen Anaphylaxis  . Peach Flavor Hives, Shortness Of Breath and Other (See Comments)    SWELLING OF MOUTH  . Pollen Extract Hives, Shortness Of Breath and Swelling    I have reviewed patient's Past Medical Hx, Surgical Hx, Family Hx, Social Hx, medications and allergies.   Review of Systems  Constitutional: Negative.   Gastrointestinal: Negative.   Genitourinary: Positive for vaginal bleeding. Negative for dysuria and vaginal discharge.  Musculoskeletal: Positive for back pain.    OBJECTIVE Patient Vitals for the past 24 hrs:  BP Temp Temp src Pulse Resp SpO2 Height Weight  04/30/19 1211 113/64 - - 87 19 99 % - -  04/30/19 1114 (!) 135/58 - - 90 - - - -  04/30/19 1058 (!) 141/72 98.7 F (37.1 C) Oral 91 18 99 % - -  04/30/19 1053 - - - - - - _0  (1.88 m) (!) 185.7 kg   Constitutional: Well-developed, well-nourished female in no acute distress.  Cardiovascular: normal rate & rhythm, no murmur Respiratory: normal  rate and effort. Lung sounds clear throughout GI: Abd soft, non-tender, Pos BS x 4. No guarding or rebound tenderness MS: Extremities nontender, no edema, normal ROM Neurologic: Alert and oriented x 4.  GU:     SPECULUM EXAM: NEFG, physiologic discharge, no blood noted, cervix clean  BIMANUAL: No CMT. cervix closed  LAB  RESULTS Results for orders placed or performed during the hospital encounter of 04/30/19 (from the past 24 hour(s))  Urinalysis, Routine w reflex microscopic     Status: None   Collection Time: 04/30/19 11:04 AM  Result Value Ref Range   Color, Urine YELLOW YELLOW   APPearance CLEAR CLEAR   Specific Gravity, Urine 1.029 1.005 - 1.030   pH 6.0 5.0 - 8.0   Glucose, UA NEGATIVE NEGATIVE mg/dL   Hgb urine dipstick NEGATIVE NEGATIVE   Bilirubin Urine NEGATIVE NEGATIVE   Ketones, ur NEGATIVE NEGATIVE mg/dL   Protein, ur NEGATIVE NEGATIVE mg/dL   Nitrite NEGATIVE NEGATIVE   Leukocytes,Ua NEGATIVE NEGATIVE    IMAGING No results found.  MAU COURSE Orders Placed This Encounter  Procedures  . Urinalysis, Routine w reflex microscopic  . Discharge patient   No orders of the defined types were placed in this encounter.   MDM FHT present via doppler  RH positive  No blood on exam. Cervix closed.   ASSESSMENT 1. Vaginal bleeding in pregnancy, second trimester   2. [redacted] weeks gestation of pregnancy   3. Fetal heart tones present, second trimester     PLAN Discharge home in stable condition. Bleeding precautions Follow-up Information    [facility] Follow up.   Specialty: Obstetrics and Gynecology Why: return for worsening symptoms Contact information: [address] [id number] [place] [phone]         Allergies as of 04/30/2019      Reactions   Bee Pollen Hives, Shortness Of Breath, Swelling   Bee Venom Hives, Shortness Of Breath, Swelling   Hydrocodone-acetaminophen Anaphylaxis   Peach  Flavor Hives, Shortness Of Breath, Other (See Comments)   SWELLING OF MOUTH   Pollen Extract Hives, Shortness Of Breath, Swelling      Medication List    STOP taking these medications   terconazole 0.8 % vaginal cream Commonly known as: Terazol 3     TAKE these medications   acetaminophen 325 MG tablet Commonly known as: TYLENOL Take 650 mg by mouth every 6 (six) hours as needed.   aspirin EC 81 MG tablet Take 1 tablet (81 mg total) by mouth daily. Take after 12 weeks for prevention of preeclampsia later in pregnancy-do not start until 04/17/19   Blood Pressure Monitoring Kit 1 kit by Does not apply route once a week.   loratadine 10 MG tablet Commonly known as: CLARITIN Take 10 mg by mouth daily as needed for allergies.   ondansetron 4 MG disintegrating tablet Commonly known as: Zofran ODT Take 1 tablet (4 mg total) by mouth every 8 (eight) hours as needed for nausea or vomiting.   PRENATAL VITAMINS PO Take by mouth.        Jorje Guild, NP 04/30/2019  12:13 PM

## 2019-05-04 ENCOUNTER — Other Ambulatory Visit: Payer: Self-pay

## 2019-05-04 ENCOUNTER — Other Ambulatory Visit: Payer: BC Managed Care – PPO

## 2019-05-05 ENCOUNTER — Other Ambulatory Visit: Payer: Self-pay

## 2019-05-05 ENCOUNTER — Encounter: Payer: Self-pay | Admitting: Obstetrics and Gynecology

## 2019-05-05 ENCOUNTER — Telehealth (INDEPENDENT_AMBULATORY_CARE_PROVIDER_SITE_OTHER): Payer: BC Managed Care – PPO | Admitting: Obstetrics and Gynecology

## 2019-05-05 DIAGNOSIS — Z3402 Encounter for supervision of normal first pregnancy, second trimester: Secondary | ICD-10-CM

## 2019-05-05 DIAGNOSIS — O99212 Obesity complicating pregnancy, second trimester: Secondary | ICD-10-CM

## 2019-05-05 DIAGNOSIS — R030 Elevated blood-pressure reading, without diagnosis of hypertension: Secondary | ICD-10-CM

## 2019-05-05 DIAGNOSIS — O98812 Other maternal infectious and parasitic diseases complicating pregnancy, second trimester: Secondary | ICD-10-CM

## 2019-05-05 DIAGNOSIS — O99210 Obesity complicating pregnancy, unspecified trimester: Secondary | ICD-10-CM

## 2019-05-05 DIAGNOSIS — Z3A14 14 weeks gestation of pregnancy: Secondary | ICD-10-CM

## 2019-05-05 DIAGNOSIS — Z6841 Body Mass Index (BMI) 40.0 and over, adult: Secondary | ICD-10-CM

## 2019-05-05 DIAGNOSIS — Z3400 Encounter for supervision of normal first pregnancy, unspecified trimester: Secondary | ICD-10-CM

## 2019-05-05 DIAGNOSIS — N760 Acute vaginitis: Secondary | ICD-10-CM

## 2019-05-05 DIAGNOSIS — B9689 Other specified bacterial agents as the cause of diseases classified elsewhere: Secondary | ICD-10-CM

## 2019-05-05 MED ORDER — BLOOD PRESSURE MONITORING KIT: 1.0000 | PACK | 0 refills | Status: DC

## 2019-05-05 NOTE — Progress Notes (Signed)
S/w pt to prepare for mychart visit, pt reports pressure on and off and mild spotting at times. Pt states that she does not have BP cuff, advised will send request for one.

## 2019-05-05 NOTE — Progress Notes (Signed)
TELEHEALTH OBSTETRICS PRENATAL VIRTUAL VIDEO VISIT ENCOUNTER NOTE  Provider location: [facility] [place]   I connected with Jamiah Gillean on 05/05/19 at  1:30 PM EDT by MyChart Video Encounter at home and verified that I am speaking with the correct person using two identifiers.   I discussed the limitations, risks, security and privacy concerns of performing an evaluation and management service virtually and the availability of in person appointments. I also discussed with the patient that there may be a patient responsible charge related to this service. The patient expressed understanding and agreed to proceed. Subjective:  Maureen Lewis is a 23 y.o. G1P0000 at 9w6dbeing seen today for ongoing prenatal care.  She is currently monitored for the following issues for this high-risk pregnancy and has Shoulder pain, left; Constipation; GERD (gastroesophageal reflux disease); Back pain; Pseudotumor cerebri syndrome; Obesity hypoventilation syndrome ([place]; Benign paroxysmal positional vertigo due to bilateral vestibular disorder; OSA (obstructive sleep apnea); Hypersomnia with sleep apnea; Female hirsutism; PCOS (polycystic ovarian syndrome); Vitamin D deficiency; Prediabetes; Depression; Class 3 severe obesity with serious comorbidity and body mass index (BMI) of 50.0 to 59.9 in adult ([facility]; Anemia; Mass; Vertigo; Bacterial vaginosis; Supervision of normal first pregnancy; Obesity during pregnancy, antepartum; and Nausea and vomiting during pregnancy on their problem list.  Patient reports no complaints.  Contractions: Not present. Vag. Bleeding: None.   . Denies any leaking of fluid.   The following portions of the patient's history were reviewed and updated as appropriate: allergies, current medications, past family history, past medical history, past social history, past surgical history and problem list.   Objective:  There were no vitals filed for this visit.  Fetal  Status:           General:  Alert, oriented and cooperative. Patient is in no acute distress.  Respiratory: Normal respiratory effort, no problems with respiration noted  Mental Status: Normal mood and affect. Normal behavior. Normal judgment and thought content.  Rest of physical exam deferred due to type of encounter  Imaging: No results found.  Assessment and Plan:  Pregnancy: G1P0000 at 163w6d1. Bacterial vaginosis Took meds, now no issues  2. Class 3 severe obesity with serious comorbidity and body mass index (BMI) of 50.0 to 59.9 in adult, unspecified obesity type ([facility] arrange 2 hr GTT Cont baby ASA  3. Encounter for supervision of normal first pregnancy in second trimester  4. Obesity during pregnancy, antepartum  5. Supervision of normal first pregnancy, antepartum - Blood Pressure Monitoring KIT; 1 kit by Does not apply route once a week.  Dispense: 1 kit; Refill: 0  6. Elevated blood pressure reading without diagnosis of hypertension - one elevated BP reading in MAU, none else, patient denies any diagnosis of HTN - BP cuff ordered  Preterm labor symptoms and general obstetric precautions including but not limited to vaginal bleeding, contractions, leaking of fluid and fetal movement were reviewed in detail with the patient. I discussed the assessment and treatment plan with the patient. The patient was provided an opportunity to ask questions and all were answered. The patient agreed with the plan and demonstrated an understanding of the instructions. The patient was advised to call back or seek an in-person office evaluation/[facility] any urgent or concerning symptoms. Please refer to After Visit Summary for other counseling recommendations.   I provided 15 minutes of face-to-face time during this encounter.  Return in about 4 weeks (  around 06/02/2019) for virtual, OB visit (MD).  Future Appointments  Date Time Provider  [place]  05/09/2019  1:00 PM [facility] [place] None  07/27/2019 10:30 AM Salvadore Dom, MD [place] None    Sloan Leiter, [place] for [facility], [place]

## 2019-05-09 ENCOUNTER — Other Ambulatory Visit: Payer: BC Managed Care – PPO

## 2019-05-09 ENCOUNTER — Other Ambulatory Visit: Payer: Self-pay

## 2019-05-09 DIAGNOSIS — Z3402 Encounter for supervision of normal first pregnancy, second trimester: Secondary | ICD-10-CM

## 2019-05-11 ENCOUNTER — Inpatient Hospital Stay (HOSPITAL_COMMUNITY)
Admission: AD | Admit: 2019-05-11 | Discharge: 2019-05-11 | Disposition: A | Discharge status: Home / Self Care | Payer: BC Managed Care – PPO | Attending: Obstetrics & Gynecology | Admitting: Obstetrics & Gynecology

## 2019-05-11 ENCOUNTER — Telehealth: Payer: Self-pay

## 2019-05-11 ENCOUNTER — Encounter (HOSPITAL_COMMUNITY): Payer: Self-pay | Admitting: *Deleted

## 2019-05-11 ENCOUNTER — Other Ambulatory Visit: Payer: Self-pay

## 2019-05-11 DIAGNOSIS — O26892 Other specified pregnancy related conditions, second trimester: Secondary | ICD-10-CM

## 2019-05-11 DIAGNOSIS — Z885 Allergy status to narcotic agent status: Secondary | ICD-10-CM | POA: Insufficient documentation

## 2019-05-11 DIAGNOSIS — Z833 Family history of diabetes mellitus: Secondary | ICD-10-CM | POA: Diagnosis not present

## 2019-05-11 DIAGNOSIS — R519 Headache, unspecified: Secondary | ICD-10-CM

## 2019-05-11 DIAGNOSIS — O9989 Other specified diseases and conditions complicating pregnancy, childbirth and the puerperium: Secondary | ICD-10-CM | POA: Insufficient documentation

## 2019-05-11 DIAGNOSIS — Z3402 Encounter for supervision of normal first pregnancy, second trimester: Secondary | ICD-10-CM

## 2019-05-11 DIAGNOSIS — R51 Headache: Secondary | ICD-10-CM | POA: Diagnosis not present

## 2019-05-11 DIAGNOSIS — Z3492 Encounter for supervision of normal pregnancy, unspecified, second trimester: Secondary | ICD-10-CM

## 2019-05-11 DIAGNOSIS — Z91030 Bee allergy status: Secondary | ICD-10-CM | POA: Insufficient documentation

## 2019-05-11 DIAGNOSIS — N760 Acute vaginitis: Secondary | ICD-10-CM

## 2019-05-11 DIAGNOSIS — Z91048 Other nonmedicinal substance allergy status: Secondary | ICD-10-CM | POA: Insufficient documentation

## 2019-05-11 DIAGNOSIS — O23592 Infection of other part of genital tract in pregnancy, second trimester: Secondary | ICD-10-CM

## 2019-05-11 DIAGNOSIS — B9689 Other specified bacterial agents as the cause of diseases classified elsewhere: Secondary | ICD-10-CM

## 2019-05-11 DIAGNOSIS — O99212 Obesity complicating pregnancy, second trimester: Secondary | ICD-10-CM | POA: Diagnosis not present

## 2019-05-11 DIAGNOSIS — Z8349 Family history of other endocrine, nutritional and metabolic diseases: Secondary | ICD-10-CM | POA: Diagnosis not present

## 2019-05-11 DIAGNOSIS — Z91018 Allergy to other foods: Secondary | ICD-10-CM | POA: Diagnosis not present

## 2019-05-11 DIAGNOSIS — Z3A15 15 weeks gestation of pregnancy: Secondary | ICD-10-CM | POA: Diagnosis not present

## 2019-05-11 LAB — URINALYSIS, ROUTINE W REFLEX MICROSCOPIC
Bilirubin Urine: NEGATIVE
Glucose, UA: NEGATIVE mg/dL
Hgb urine dipstick: NEGATIVE
Ketones, ur: NEGATIVE mg/dL
Leukocytes,Ua: NEGATIVE
Nitrite: NEGATIVE
Protein, ur: NEGATIVE mg/dL
Specific Gravity, Urine: 1.033 — ABNORMAL HIGH (ref 1.005–1.030)
pH: 5 (ref 5.0–8.0)

## 2019-05-11 LAB — CBC
HCT: 34.4 % — ABNORMAL LOW (ref 36.0–46.0)
Hemoglobin: 11 g/dL — ABNORMAL LOW (ref 12.0–15.0)
MCH: 23.4 pg — ABNORMAL LOW (ref 26.0–34.0)
MCHC: 32 g/dL (ref 30.0–36.0)
MCV: 73 fL — ABNORMAL LOW (ref 80.0–100.0)
Platelets: 214 10*3/uL (ref 150–400)
RBC: 4.71 MIL/uL (ref 3.87–5.11)
RDW: 14.6 % (ref 11.5–15.5)
WBC: 7.7 10*3/uL (ref 4.0–10.5)
nRBC: 0 % (ref 0.0–0.2)

## 2019-05-11 MED ORDER — DEXAMETHASONE SODIUM PHOSPHATE 10 MG/ML IJ SOLN
10.0000 mg | Freq: Once | INTRAMUSCULAR | Status: AC
  Administered 2019-05-11: 10 mg via INTRAVENOUS
  Filled 2019-05-11: qty 1

## 2019-05-11 MED ORDER — DIPHENHYDRAMINE HCL 50 MG/ML IJ SOLN
25.0000 mg | Freq: Once | INTRAMUSCULAR | Status: AC
  Administered 2019-05-11: 25 mg via INTRAVENOUS
  Filled 2019-05-11: qty 1

## 2019-05-11 MED ORDER — LACTATED RINGERS IV SOLN
INTRAVENOUS | Status: DC
  Administered 2019-05-11: 18:00:00 via INTRAVENOUS

## 2019-05-11 MED ORDER — METOCLOPRAMIDE HCL 5 MG/ML IJ SOLN
10.0000 mg | Freq: Once | INTRAMUSCULAR | Status: AC
  Administered 2019-05-11: 10 mg via INTRAVENOUS
  Filled 2019-05-11: qty 2

## 2019-05-11 NOTE — Telephone Encounter (Signed)
Returned call and pt stated that she has been having headaches that are not relieved by Tylenol, dizziness and slightly blurred vision. Pt currently has not received BP cuff yet. Advised pt to be evaluated the hospital

## 2019-05-11 NOTE — Discharge Instructions (Signed)

## 2019-05-11 NOTE — MAU Provider Note (Signed)
History     CSN: [id number]  Arrival date and time: 05/11/19 1641   First Provider Initiated Contact with Patient 05/11/19 1721     Chief Complaint  Patient presents with  . Headache   HPI Maureen Lewis is a 22 y.o. G1P0000 at [redacted]w[redacted]d who presents to MAU with chief complaint of headache. This is a recurring problem, onset two weeks ago. Patient states moderate to severe headaches consistently accompany her BPPV flares but those flares have been resolved for about two years. She discontinued her BPPV medicine when she learned she was pregnant. She reports having a headache "almost every day" for the past two weeks. Her pain is located behind both eyes and radiates laterally to the back of her head. She denies aggravating factors. Her pain is intermittently relieved by a low-stimulation environment. She has been managing her discomfort with Tylenol, which she last took this morning.   Patient reports secondary complaint of dizziness which usually accompanies her headaches but is sometimes a standalone symptom. She denies daily episodes of dizziness and is unable to assess how frequently they occur. She reports "really good" hydration with water.   She denies abdominal pain, vaginal bleeding, abnormal vaginal discharge, fever or recent illness.   OB History    Gravida  1   Para  0   Term  0   Preterm  0   AB  0   Living  0     SAB  0   TAB  0   Ectopic  0   Multiple  0   Live Births  0           Past Medical History:  Diagnosis Date  . Abnormal uterine bleeding   . Acid reflux   . Amenorrhea   . Anemia    no current med.  . Complication of anesthesia    states had to keep giving her anesthesia during EGD  . Constipation   . Finger mass, left 03/2017   middle finger  . Irritable bowel syndrome (IBS)   . Morbid obesity with body mass index (BMI) of 45.0 to 49.9 in adult ([facility])   . Plantar fasciitis, bilateral   . Polycystic ovary syndrome   . Shortness of  breath   . Sleep apnea    no CPAP use    Past Surgical History:  Procedure Laterality Date  . COLONOSCOPY WITH PROPOFOL  10/07/2016  . ESOPHAGOGASTRODUODENOSCOPY  12/21/2015  . EXCISION MASS UPPER EXTREMETIES Left 04/21/2017   Procedure: EXCISION MASS LEFT MIDDLE FINGER;  Surgeon: Kuzma, Gary, MD;  Location: [facility];  Service: Orthopedics;  Laterality: Left;    Family History  Problem Relation Age of Onset  . Diabetes Maternal Aunt   . Hypertension Maternal Uncle   . Depression Maternal Grandfather   . Diabetes Maternal Grandfather   . Hypertension Maternal Grandfather   . Arthritis Mother   . High blood pressure Mother   . Depression Mother   . Sleep apnea Mother   . Obesity Mother     Social History   Tobacco Use  . Smoking status: Never Smoker  . Smokeless tobacco: Never Used  Substance Use Topics  . Alcohol use: Yes    Comment: social   . Drug use: No    Allergies:  Allergies  Allergen Reactions  . Bee Pollen Hives, Shortness Of Breath and Swelling  . Bee Venom Hives, Shortness Of Breath and Swelling  . Hydrocodone-Acetaminophen Anaphylaxis  . Peach Flavor   Hives, Shortness Of Breath and Other (See Comments)    SWELLING OF MOUTH  . Pollen Extract Hives, Shortness Of Breath and Swelling    Medications Prior to Admission  Medication Sig Dispense Refill Last Dose  . acetaminophen (TYLENOL) 325 MG tablet Take 650 mg by mouth every 6 (six) hours as needed.   05/11/2019 at Unknown time  . aspirin EC 81 MG tablet Take 1 tablet (81 mg total) by mouth daily. Take after 12 weeks for prevention of preeclampsia later in pregnancy-do not start until 04/17/19 300 tablet 0 05/11/2019 at Unknown time  . Blood Pressure Monitoring KIT 1 kit by Does not apply route once a week. 1 kit 0 05/11/2019 at Unknown time  . ondansetron (ZOFRAN ODT) 4 MG disintegrating tablet Take 1 tablet (4 mg total) by mouth every 8 (eight) hours as needed for nausea or vomiting. 30 tablet  1 05/11/2019 at Unknown time  . Prenatal Vit-Fe Fumarate-FA (PRENATAL VITAMINS PO) Take by mouth.   05/11/2019 at Unknown time  . loratadine (CLARITIN) 10 MG tablet Take 10 mg by mouth daily as needed for allergies.    More than a month at Unknown time    Review of Systems  Constitutional: Negative for chills, fatigue and fever.  Eyes: Negative for photophobia and visual disturbance.  Gastrointestinal: Negative for abdominal pain.  Genitourinary: Negative for dysuria, vaginal bleeding, vaginal discharge and vaginal pain.  Musculoskeletal: Negative for back pain.  Neurological: Positive for dizziness and headaches. Negative for syncope and weakness.  All other systems reviewed and are negative.  Physical Exam   Blood pressure 139/64, pulse 100, temperature 98.7 F (37.1 C), resp. rate 18, height 6' 2.5" (1.892 m), weight (!) 184.6 kg, last menstrual period 01/21/2019.  Physical Exam  Nursing note and vitals reviewed. Constitutional: She is oriented to person, place, and time. She appears well-developed and well-nourished.  Cardiovascular: Normal rate and normal pulses.  Respiratory: Effort normal.  GI: Soft. She exhibits no distension. There is no abdominal tenderness. There is no rebound and no guarding.  Genitourinary:    Genitourinary Comments: Deferred due to chief complaint   Neurological: She is alert and oriented to person, place, and time. She has normal strength. No cranial nerve deficit or sensory deficit.  Skin: Skin is warm and dry.  Psychiatric: She has a normal mood and affect. Her behavior is normal. Judgment and thought content normal.    MAU Course/MDM  Procedures  --Patient sleeping after headache cocktail. Reports pain 7/10 upon waking. --Normotensive in MAU. No concerning findings on physical exam or labs collected today  Patient Vitals for the past 24 hrs:  BP Temp Pulse Resp Height Weight  05/11/19 1837 114/63 - 87 16 - -  05/11/19 1704 139/64 98.7 F (37.1  C) 100 18 6' 2.5" (1.892 m) (!) 184.6 kg   Results for orders placed or performed during the hospital encounter of 05/11/19 (from the past 24 hour(s))  Urinalysis, Routine w reflex microscopic     Status: Abnormal   Collection Time: 05/11/19  5:06 PM  Result Value Ref Range   Color, Urine YELLOW YELLOW   APPearance HAZY (A) CLEAR   Specific Gravity, Urine 1.033 (H) 1.005 - 1.030   pH 5.0 5.0 - 8.0   Glucose, UA NEGATIVE NEGATIVE mg/dL   Hgb urine dipstick NEGATIVE NEGATIVE   Bilirubin Urine NEGATIVE NEGATIVE   Ketones, ur NEGATIVE NEGATIVE mg/dL   Protein, ur NEGATIVE NEGATIVE mg/dL   Nitrite NEGATIVE NEGATIVE  Leukocytes,Ua NEGATIVE NEGATIVE  CBC     Status: Abnormal   Collection Time: 05/11/19  5:30 PM  Result Value Ref Range   WBC 7.7 4.0 - 10.5 K/uL   RBC 4.71 3.87 - 5.11 MIL/uL   Hemoglobin 11.0 (L) 12.0 - 15.0 g/dL   HCT 34.4 (L) 36.0 - 46.0 %   MCV 73.0 (L) 80.0 - 100.0 fL   MCH 23.4 (L) 26.0 - 34.0 pg   MCHC 32.0 30.0 - 36.0 g/dL   RDW 14.6 11.5 - 15.5 %   Platelets 214 150 - 400 K/uL   nRBC 0.0 0.0 - 0.2 %   Meds ordered this encounter  Medications  . diphenhydrAMINE (BENADRYL) injection 25 mg  . metoCLOPramide (REGLAN) injection 10 mg  . dexamethasone (DECADRON) injection 10 mg  . lactated ringers infusion   Assessment and Plan  --23 y.o. G1P0000 at [redacted]w[redacted]d --FHT 147 by Doppler --Headache in pregnancy, resolved with medications given in MAU --Discharge home in stable condition  F/U: Next [place]t [place]08/13/20  SDarlina Rumpf C[place]7/22/2020, 7:27 PM

## 2019-05-11 NOTE — MAU Note (Signed)
Pt reports has had bad headache for the past week. Having blurred vision and dizzy with it. Taking tylenol without relief.

## 2019-05-16 ENCOUNTER — Encounter: Payer: Self-pay | Admitting: Obstetrics and Gynecology

## 2019-05-19 ENCOUNTER — Other Ambulatory Visit: Payer: Self-pay

## 2019-05-19 ENCOUNTER — Other Ambulatory Visit: Payer: BC Managed Care – PPO

## 2019-05-19 DIAGNOSIS — Z3402 Encounter for supervision of normal first pregnancy, second trimester: Secondary | ICD-10-CM

## 2019-05-20 LAB — GLUCOSE TOLERANCE, 2 HOURS W/ 1HR
Glucose, 1 hour: 138 mg/dL (ref 65–179)
Glucose, 2 hour: 125 mg/dL (ref 65–152)
Glucose, Fasting: 103 mg/dL — ABNORMAL HIGH (ref 65–91)

## 2019-05-23 ENCOUNTER — Other Ambulatory Visit: Payer: Self-pay

## 2019-05-23 ENCOUNTER — Other Ambulatory Visit: Payer: Self-pay | Admitting: Obstetrics and Gynecology

## 2019-05-23 DIAGNOSIS — O24419 Gestational diabetes mellitus in pregnancy, unspecified control: Secondary | ICD-10-CM

## 2019-05-23 HISTORY — DX: Gestational diabetes mellitus in pregnancy, unspecified control: O24.419

## 2019-05-23 MED ORDER — ACCU-CHEK GUIDE W/DEVICE KIT: 1.0000 | PACK | Freq: Four times a day (QID) | 0 refills | Status: DC

## 2019-05-23 MED ORDER — ACCU-CHEK GUIDE VI STRP: ORAL_STRIP | 12 refills | Status: DC

## 2019-05-23 MED ORDER — ACCU-CHEK FASTCLIX LANCETS MISC: 1.0000 [IU] | Freq: Four times a day (QID) | 12 refills | Status: DC

## 2019-05-23 NOTE — Progress Notes (Signed)
GDM supplies sent, pt made aware.

## 2019-05-26 ENCOUNTER — Ambulatory Visit: Payer: BC Managed Care – PPO | Admitting: *Deleted

## 2019-05-26 ENCOUNTER — Encounter: Payer: Self-pay | Admitting: Obstetrics & Gynecology

## 2019-05-26 ENCOUNTER — Encounter: Payer: Self-pay | Admitting: Obstetrics and Gynecology

## 2019-05-26 ENCOUNTER — Encounter: Payer: BC Managed Care – PPO | Attending: Obstetrics & Gynecology | Admitting: *Deleted

## 2019-05-26 ENCOUNTER — Other Ambulatory Visit: Payer: Self-pay

## 2019-05-26 DIAGNOSIS — D563 Thalassemia minor: Secondary | ICD-10-CM | POA: Insufficient documentation

## 2019-05-26 NOTE — Progress Notes (Signed)
Patient was seen on 05/26/2019 for Pre-Diabetes and pregnancy self-management. EDD 10/28/2019. Patient states history of this diabetes for about 5 years.  Diet history obtained. Patient eats good variety of all food groups and beverages include water, occasional juice and Ginger ale for nausea.  She states no training on Carbohydrate Counting. Patient is currently on no diabetes medications. She is 6' 2.5" and states she weighs about 407 pounds so she will need more food than the typical meal plan for Pre Diabetes. 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  The following learning objectives were met by the patient :   States the definition of Pre-Diabetes and pregnancy   States why dietary management is important in controlling blood glucose  Describes the effects of carbohydrates on blood glucose levels  Demonstrates ability to create a balanced meal plan  Demonstrates carbohydrate counting   States when to check blood glucose levels  Demonstrates proper blood glucose monitoring techniques  States the effect of stress and exercise on blood glucose levels  States the importance of limiting caffeine and abstaining from alcohol and smoking  Plan:   Aim for 4 Carb Choices per meal (45 grams) +/- 1 either way   Aim for 1-2 Carbs per snack  Begin reading food labels for Total Carbohydrate of foods  Consider  increasing your activity level by walking or other activity daily as tolerated  Begin checking BG before breakfast and 2 hours after first bite of breakfast, lunch and dinner as directed by MD   Patient was introduced to [employer], she plans to use as record of BG electronically   Take medication as directed by MD  Patient already has a meter: Accu Chek Guide, but doesn't know how to use it. After being trained today, her BG was 133 mg/dl after breakfast meal. Patient instructed to test pre breakfast and 2 hours each  meal as directed by MD  Patient instructed to monitor glucose levels: FBS: 60 - 95 mg/dl 2 hour: <120 mg/dl  Patient received the following handouts:  Nutrition Diabetes and Pregnancy  Carbohydrate Counting List  Patient will be seen for follow-up in 1 month

## 2019-05-30 ENCOUNTER — Other Ambulatory Visit: Payer: Self-pay

## 2019-05-30 DIAGNOSIS — D563 Thalassemia minor: Secondary | ICD-10-CM

## 2019-06-02 ENCOUNTER — Telehealth (INDEPENDENT_AMBULATORY_CARE_PROVIDER_SITE_OTHER): Payer: BC Managed Care – PPO | Admitting: Obstetrics

## 2019-06-02 ENCOUNTER — Encounter: Payer: Self-pay | Admitting: Obstetrics

## 2019-06-02 ENCOUNTER — Other Ambulatory Visit: Payer: Self-pay

## 2019-06-02 DIAGNOSIS — O24410 Gestational diabetes mellitus in pregnancy, diet controlled: Secondary | ICD-10-CM

## 2019-06-02 DIAGNOSIS — Z3A18 18 weeks gestation of pregnancy: Secondary | ICD-10-CM

## 2019-06-02 DIAGNOSIS — O99210 Obesity complicating pregnancy, unspecified trimester: Secondary | ICD-10-CM

## 2019-06-02 DIAGNOSIS — Z3402 Encounter for supervision of normal first pregnancy, second trimester: Secondary | ICD-10-CM

## 2019-06-02 DIAGNOSIS — O99212 Obesity complicating pregnancy, second trimester: Secondary | ICD-10-CM

## 2019-06-02 DIAGNOSIS — D563 Thalassemia minor: Secondary | ICD-10-CM

## 2019-06-02 MED ORDER — METFORMIN HCL ER 500 MG PO TB24: 1000.0000 mg | ORAL_TABLET | Freq: Two times a day (BID) | ORAL | 5 refills | Status: DC

## 2019-06-02 NOTE — Progress Notes (Signed)
I connected with Maureen Lewis on 06/02/19 at  1:30 PM EDT by telephone and verified that I am speaking with the correct person using two identifiers.   GDM pt - Highest fasting 126, Highest after eating 172 Pt needs pregnancy restriction letter - sent to My chart  Pt does not have BP cuff; she will take BP once she gets home and enter the value into [employer].

## 2019-06-02 NOTE — Progress Notes (Signed)
TELEHEALTH OBSTETRICS PRENATAL VIRTUAL VIDEO VISIT ENCOUNTER NOTE  Provider location: [facility] [place]   I connected with [name] on 06/02/19 at  1:30 PM EDT by WebEx OB MyChart Video Encounter at home and verified that I am speaking with the correct person using two identifiers.   I discussed the limitations, risks, security and privacy concerns of performing an evaluation and management service virtually and the availability of in person appointments. I also discussed with the patient that there may be a patient responsible charge related to this service. The patient expressed understanding and agreed to proceed. Subjective:  Maureen Lewis is a 23 y.o. G1P0000 at [redacted]w[redacted]d being seen today for ongoing prenatal care.  She is currently monitored for the following issues for this high-risk pregnancy and has Shoulder pain, left; Constipation; GERD (gastroesophageal reflux disease); Back pain; Pseudotumor cerebri syndrome; Obesity hypoventilation syndrome ([place]); Benign paroxysmal positional vertigo due to bilateral vestibular disorder; OSA (obstructive sleep apnea); Hypersomnia with sleep apnea; Female hirsutism; PCOS (polycystic ovarian syndrome); Vitamin D deficiency; Prediabetes; Depression; Class 3 severe obesity with serious comorbidity and body mass index (BMI) of 50.0 to 59.9 in adult [facility]); Anemia; Mass; Vertigo; Bacterial vaginosis; Supervision of normal first pregnancy; Obesity during pregnancy, antepartum; Nausea and vomiting during pregnancy; Gestational diabetes; and Alpha thalassemia trait on their problem list.  Patient reports no complaints.  Contractions: Not present. Vag. Bleeding: None.  Movement: Present. Denies any leaking of fluid.   The following portions of the patient's history were reviewed and updated as appropriate: allergies, current medications, past family history, past medical history, past social history, past surgical history and problem list.    Objective:  There were no vitals filed for this visit.  Fetal Status:     Movement: Present     General:  Alert, oriented and cooperative. Patient is in no acute distress.  Respiratory: Normal respiratory effort, no problems with respiration noted  Mental Status: Normal mood and affect. Normal behavior. Normal judgment and thought content.  Rest of physical exam deferred due to type of encounter  Imaging: No results found.  Assessment and Plan:  Pregnancy: G1P0000 at [redacted]w[redacted]d  1. Encounter for supervision of normal first pregnancy in second trimester  2. Diet controlled gestational diabetes mellitus (GDM) in second trimester - blood sugars not well controlled.  Metformin started. Rx: - metFORMIN (GLUCOPHAGE XR) 500 MG 24 hr tablet; Take 2 tablets (1,000 mg total) by mouth 2 (two) times daily after a meal. Take after breakfast and after supper.  Dispense: 60 tablet; Refill: 5  3. Obesity during pregnancy, antepartum   4. Alpha thalassemia silent carrier  Preterm labor symptoms and general obstetric precautions including but not limited to vaginal bleeding, contractions, leaking of fluid and fetal movement were reviewed in detail with the patient. I discussed the assessment and treatment plan with the patient. The patient was provided an opportunity to ask questions and all were answered. The patient agreed with the plan and demonstrated an understanding of the instructions. The patient was advised to call back or seek an in-person office evaluation/go to [facility] at [facility] for any urgent or concerning symptoms. Please refer to After Visit Summary for other counseling recommendations.   I provided 10 minutes of face-to-face time during this encounter.  Return in about 4 weeks (around 06/30/2019) for MyChart.  Future Appointments  Date Time Provider [place]  06/06/2019 11:00 AM [place] M[facility]-US  06/06/2019 11:00 AM [name]  WH-MFCUS MFC-US   06/06/2019  1:00 PM [place] GENETIC COUNSELING RM [place] MFC-US  06/23/2019  8:15 AM WOC-EDUCATION WOC-WOCA WOC  07/27/2019 10:30 AM Salvadore Dom, MD [place] None    Baltazar Najjar, [place] for [facility] 06/02/2019

## 2019-06-03 ENCOUNTER — Telehealth: Payer: Self-pay

## 2019-06-03 NOTE — Telephone Encounter (Signed)
Received a call from baby rx for a elevated reading. Pt states that she had a headache and checked her bp and got the elevated reading 117/97. Pt states that she rechecked her bp after an hour and it was 118/79. Today her bp was 109/80. Pt denies having any headaches, swelling, blurred vision, or epigastric pain today. Patient advised to continue to monitor bp. Sent to provider for [facility]

## 2019-06-06 ENCOUNTER — Ambulatory Visit (HOSPITAL_COMMUNITY): Payer: BC Managed Care – PPO

## 2019-06-06 ENCOUNTER — Other Ambulatory Visit: Payer: Self-pay

## 2019-06-06 ENCOUNTER — Ambulatory Visit (HOSPITAL_BASED_OUTPATIENT_CLINIC_OR_DEPARTMENT_OTHER): Payer: BC Managed Care – PPO | Admitting: Genetic Counselor

## 2019-06-06 ENCOUNTER — Ambulatory Visit (HOSPITAL_COMMUNITY)
Admission: RE | Admit: 2019-06-06 | Discharge: 2019-06-06 | Disposition: A | Discharge status: Home / Self Care | Payer: BC Managed Care – PPO | Source: Ambulatory Visit | Attending: Obstetrics and Gynecology | Admitting: Obstetrics and Gynecology

## 2019-06-06 ENCOUNTER — Ambulatory Visit (HOSPITAL_COMMUNITY): Payer: Self-pay | Admitting: Obstetrics and Gynecology

## 2019-06-06 ENCOUNTER — Encounter (HOSPITAL_COMMUNITY): Payer: Self-pay

## 2019-06-06 ENCOUNTER — Ambulatory Visit (HOSPITAL_COMMUNITY): Payer: BC Managed Care – PPO | Admitting: *Deleted

## 2019-06-06 ENCOUNTER — Other Ambulatory Visit (HOSPITAL_COMMUNITY): Payer: Self-pay | Admitting: *Deleted

## 2019-06-06 VITALS — BP 120/60 | HR 98 | Temp 98.6°F

## 2019-06-06 DIAGNOSIS — O24415 Gestational diabetes mellitus in pregnancy, controlled by oral hypoglycemic drugs: Secondary | ICD-10-CM

## 2019-06-06 DIAGNOSIS — Z148 Genetic carrier of other disease: Secondary | ICD-10-CM

## 2019-06-06 DIAGNOSIS — Z6841 Body Mass Index (BMI) 40.0 and over, adult: Secondary | ICD-10-CM | POA: Insufficient documentation

## 2019-06-06 DIAGNOSIS — O0990 Supervision of high risk pregnancy, unspecified, unspecified trimester: Secondary | ICD-10-CM

## 2019-06-06 DIAGNOSIS — Z3A19 19 weeks gestation of pregnancy: Secondary | ICD-10-CM

## 2019-06-06 DIAGNOSIS — D563 Thalassemia minor: Secondary | ICD-10-CM | POA: Diagnosis present

## 2019-06-06 DIAGNOSIS — Z363 Encounter for antenatal screening for malformations: Secondary | ICD-10-CM

## 2019-06-06 DIAGNOSIS — Z315 Encounter for genetic counseling: Secondary | ICD-10-CM | POA: Diagnosis not present

## 2019-06-06 DIAGNOSIS — O99212 Obesity complicating pregnancy, second trimester: Secondary | ICD-10-CM | POA: Diagnosis not present

## 2019-06-06 DIAGNOSIS — O352XX0 Maternal care for (suspected) hereditary disease in fetus, not applicable or unspecified: Secondary | ICD-10-CM

## 2019-06-06 NOTE — Progress Notes (Signed)
06/06/2019  [place] Feb 22, 1996 MRN: [id number] DOV: 06/06/2019  Ms. Crocket presented to the [facility] for a genetics consultation regarding her alpha-thalssemia carrier status. Ms. Oyervides came to her appointment alone due to COVID-19 visitor restrictions.   Indication for genetic counseling - Silent alpha-thalassemia carrier  Prenatal history  Ms. Vonstein is a G74P27, 23 y.o. year old female. Her current pregnancy has completed [redacted]w[redacted]d (Estimated Date of Delivery: 10/28/19).  Ms. Dalpe denied exposure to environmental toxins or chemical agents. She denied the use of alcohol, tobacco or street drugs. She denied significant viral illnesses, fevers, and bleeding during the course of her pregnancy. Her medical and surgical histories were noncontributory.  Family History  A three generation pedigree was drafted and reviewed. The family history is remarkable for the following:  - Ms. Wiegand reports a personal history of a "hole in the heart" in childhood that did not require surgery and resolved on its own. The exact etiology of this congenital heart defect (CHD) is unclear and records are not available. We reviewed that CHDs can be isolated or a feature of an underlying genetic condition. Congenital heart defects are most often multifactorial in etiology, but can also result from chromosome aberrations, single gene conditions, or teratogenic exposures. Given that Ms. Masri does not have any other medical or developmental problems, her CHD is likely isolated. Isolated, nonsyndromic CHDs occur in approximately 1% of the general population. We discussed that if Ms. Smolinsky's CHD is isolated, the recurrence risk for any CHD in her children is approximately 5-6%. We discussed the availability of a detailed anatomy ultrasound and fetal echocardiogram to assess the development of the heart during the pregnancy.   - Ms. Mun has a maternal aunt who has a daughter with epilepsy.  This cousin has not had a genetics workup. We discussed that epilepsy is also often multifactorial in nature. We reviewed that without knowing the exact genetic cause of her cousin's epilepsy, the risk for Ms. Runyon's child to have epilepsy is likely around the 1% general population risk.   - Ms. Hornstein's aunt who has a child with epilepsy has also had 3 or more miscarriages. This same aunt has another daughter who has also had three miscarriages. We discussed that 2-5% of individuals who experience multiple miscarriages carry a balanced chromosomal translocation that can become unbalanced and potentially lethal in their offspring. It is possible that there is a chromosomal or other genetic cause contributing to multiple miscarriages and/or epilepsy in the family. Ms. Klassen was informed if the family were interested in a genetics workup, her aunt and cousins would be the most appropriate individuals to be evaluated given that they are affected and Ms. Gasior herself is not. Genetic counseling is available should these family members be interested.  The remaining family histories were reviewed and found to be noncontributory for intellectual disability and known genetic conditions. Ms. Criswell had limited information about the father of the pregnancy's family history; thus, complete risk assessment was limited.   The patient's ethnicity is African American. The father of the pregnancy's ethnicity is African American. [name] ancestry and consanguinity were denied. Pedigree will be scanned under Media.  Discussion  Ms. Wach had Horizon 14 carrier screening performed through [place]. The results of the screen identified her as a silent carrier for alpha-thalassemia (aa/a-). Alpha-thalassemia is different in its inheritance compared to other hemoglobinopathies as there are two alpha globin genes on each chromosome 16, or four alpha globin genes  total (aa/aa). A person can be a carrier of one alpha  gene mutation (aa/a-), also referred to as a "silent carrier". A person who carries two alpha globin gene mutations can either carry them in cis (both on the same chromosome, denoted as aa/--) or in trans (on different chromosomes, denoted as a-/a-). Alpha-thalassemia carriers of two mutations who have African American ancestry are more likely to have a trans arrangement (a-/a-); cis configuration is reported to be rare in individuals with African American ancestry.     There are several different forms of alpha-thalassemia. The most severe form of alpha-thalassemia, Hb Barts, is associated with an absence of alpha globin chain synthesis as a result of deletions of all four alpha globin genes (--/--).  Given that Ms. Bibb is a silent carrier (aa/a-), her pregnancies would not be at increased risk for Hb Barts, even if the father of the pregnancy is a carrier for alpha-thalassemia. Hemoglobin H (HbH) disease is caused by three deleted or dysfunctioning alpha globin alleles (a-/--) and is characterized by microcytic hypochromic hemolytic anemia, hepatosplenomegaly, mild jaundice, and sometimes thalassemia-like bone changes. Given Ms. Burlison's silent carrier status (aa/a-), the fetus would only be at risk for HbH disease (a-/--), if the father of the pregnancy is a carrier for two alpha globin mutations in cis (aa/--). If this is the case, the risk for HbH disease in the pregnancy would be 1 in 4 (25%). However, if the father of the prengnacy is a carrier for two alpha globin mutations, he would be more likely to carry them in trans configuration (a-/a-) than the cis configuration (aa/--), given his ethnicity. If he is a carrier of alpha-thalassemia in trans, then the pregnancy would not be at increased risk for HbH disease. Based on the carrier frequency for alpha-thalassemia in the African American population, the father of the pregnancy has a 1 in 30 chance of being any type of carrier for alpha-thalassemia.    Ms. Posas carrier screening was negative for the other 13 conditions screened. Thus, her risk to be a carrier for these additional conditions (listed separately in the laboratory report) has been reduced but not eliminated. We discussed that carrier testing for alpha-thalassemia via CBC, hemoglobin electrophoresis, and ferritin analysis with reflex to molecular testing is recommended for the father of the pregnancy. Ms. Bacchi indicated that she is not in contact with the father of the pregnancy anymore so it is unlikely that he would be available for carrier screening.  We also reviewed that Ms. Pickup had Panorama NIPS through [facility] that was low-risk for fetal aneuploidies. We reviewed that these results showed a less than 1 in 10,000 risk for trisomies 21, 18 and 13, and monosomy X (Turner syndrome).  In addition, the risk for triploidy and sex chromosome trisomies (47,XXX and 47,XXY) was also low. Ms. Sestak elected to have cffDNA analysis for 22q11 deletion syndrome, which was also low risk (1 in 2900). We reviewed that while this testing identifies >99% of pregnancies with trisomy 2, trisomy 52, sex chromosome trisomies (47,XXX and 47,XXY), and triploidy, it is NOT diagnostic. A positive test result requires confirmation by CVS or amniocentesis, and a negative test result does not rule out a fetal chromosome abnormality. She also understands that this testing does not identify all genetic conditions.  A complete ultrasound was performed today prior to our visit. The ultrasound report will be sent under separate cover. There were no visualized fetal anomalies or markers suggestive of aneuploidy.  Ms. Sawatzky was  also counseled regarding diagnostic testing via amniocentesis. We discussed the technical aspects of the procedure and quoted up to a 1 in 500 (0.2%) risk for spontaneous pregnancy loss or other adverse pregnancy outcomes as a result of amniocentesis. Cultured cells from an amniocentesis  sample allow for the visualization of a fetal karyotype, which can detect >99% of chromosomal aberrations. Chromosomal microarray can also be performed to identify smaller deletions or duplications of fetal chromosomal material. Amniocentesis could also be performed to assess whether the baby is affected by alpha-thalassemia. After careful consideration, Ms. Rump declined amniocentesis at this time. She understands that amniocentesis is available at any point after 16 weeks of pregnancy and that she may opt to undergo the procedure at a later date should she change her mind.  Lastly, the patient was made aware that screening for open neural tube defects (ONTDs) via MS-AFP in the second trimester in addition to level II ultrasound examination is recommended. We reviewed that Ms. Elsayed's level II ultrasound did not detect any ONTDs, and that level II ultrasound is able to detect them with 90-95% sensitivity. However, normal results from any of the above options do not guarantee a normal baby, as 3-5% of newborns have some type of birth defect, many of which are not prenatally diagnosable.   Ms. Alameda had her blood drawn for MS-AFP analysis today. Results will take 2-5 days to be returned. I will call Ms. Cressey when results become available.  I counseled Ms. Stachowski regarding the above risks and available options. The approximate face-to-face time with the genetic counselor was 30 minutes.  In summary:  Discussed carrier screening results and options for follow-up testing ? Silent carrier for alpha-thalassemia ? Father of pregnancy not available for alpha-thalassemia carrier screening  Reviewednormal results of NIPS ? Reduction in risk for Down syndrome, trisomy 38, trisomy 36, sex chromosome aneuploidies, and 22q11.2 deletion syndrome  Reviewed results of ultrasound ? No fetal anomalies or markers seen ? Reduction in risk for fetal aneuploidy  Offered additional testing and  screening ? Declined amniocentesis ? Opted to undergo MS-AFP screening. We will follow results  Reviewed family history concerns   Buelah Manis, MS Genetic Counselor

## 2019-06-10 ENCOUNTER — Telehealth (HOSPITAL_COMMUNITY): Payer: Self-pay | Admitting: Genetic Counselor

## 2019-06-10 LAB — AFP, SERUM, OPEN SPINA BIFIDA
AFP MoM: 0.62
AFP Value: 21.6 ng/mL
Gest. Age on Collection Date: 19.3 weeks
Maternal Age At EDD: 21.2 yr
OSBR Risk 1 IN: 10000
Test Results:: NEGATIVE
Weight: 401 [lb_av]

## 2019-06-10 NOTE — Telephone Encounter (Signed)
LVM for Ms. Hedtke with my name indicating that I was calling with good news re: screening results. Left my direct phone number for a call back to discuss results in detail given that voicemail did not have identifiers.  Buelah Manis, MS Genetic Counselor

## 2019-06-13 ENCOUNTER — Telehealth (HOSPITAL_COMMUNITY): Payer: Self-pay | Admitting: Genetic Counselor

## 2019-06-13 NOTE — Telephone Encounter (Signed)
I called Ms. Sebald to discuss her negative MS-AFP results. We reviewed that the risk for her pregnancy to be affected by an open neural tube defect (ONTD) such as spina bifida was calculated to be 1 in 10,000 based on results from this screen. Ms. Odonohue confirmed that she had no questions about these results.  Buelah Manis, MS Genetic Counselor

## 2019-06-22 ENCOUNTER — Telehealth: Payer: Self-pay | Admitting: Family Medicine

## 2019-06-22 NOTE — Telephone Encounter (Signed)
Spoke to patient about her appointment on 9/3 @ 8:15. Patient instructed to wear a face mask for the entire appointment and no visitors are allowed with her during the visit. Patient screened for covid symptoms and denied having any

## 2019-06-23 ENCOUNTER — Ambulatory Visit: Payer: BC Managed Care – PPO | Admitting: *Deleted

## 2019-06-23 ENCOUNTER — Encounter: Payer: BC Managed Care – PPO | Attending: Obstetrics & Gynecology | Admitting: *Deleted

## 2019-06-23 ENCOUNTER — Other Ambulatory Visit: Payer: Self-pay

## 2019-06-23 DIAGNOSIS — Z3A00 Weeks of gestation of pregnancy not specified: Secondary | ICD-10-CM | POA: Insufficient documentation

## 2019-06-23 DIAGNOSIS — O24419 Gestational diabetes mellitus in pregnancy, unspecified control: Secondary | ICD-10-CM

## 2019-06-23 NOTE — Progress Notes (Signed)
Patient was seen on 06/23/2019 for Pre-Diabetes and pregnancy self-management. EDD 10/28/2019. Patient states history of this diabetes for about 5 years.  Diet history obtained. Patient continues to eat good variety of all food groups and beverages include water, occasional juice and Ginger ale for nausea.  She states her comfort level with carb counting is now 10/10.  Patient is currently on Metformin as her diabetes medication. 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   Plan: Continue-  Aim for 4 Carb Choices per meal (45 grams) +/- 1 either way   Aim for 1-2 Carbs per snack  Begin reading food labels for Total Carbohydrate of foods  Consider  increasing your activity level by walking or other activity daily as tolerated  Begin checking BG before breakfast and 2 hours after first bite of breakfast, lunch and dinner as directed by MD   Patient was introduced to [employer], she plans to use as record of BG electronically   Take medication as directed by MD  Patient states her FBG have improved to below 95 since she started the Metformin. She also states her post meal BGs are in the lower 100's with occasional excursions to 130 mg/dl. We discussed the lack of BG in Baby [facility], and informed her that she can enter her numbers at the end of the day. Also instructed her how to access the history on her meter. She agreed to put the numbers in going forward.  Patient instructed to monitor glucose levels: FBS: 60 - 95 mg/dl 2 hour: <120 mg/dl  Patient received the following handouts:  No new handouts today  Patient will be seen for follow-up PRN

## 2019-06-30 ENCOUNTER — Telehealth (INDEPENDENT_AMBULATORY_CARE_PROVIDER_SITE_OTHER): Payer: BC Managed Care – PPO | Admitting: Obstetrics

## 2019-06-30 ENCOUNTER — Encounter: Payer: Self-pay | Admitting: Obstetrics

## 2019-06-30 VITALS — BP 128/91 | HR 107

## 2019-06-30 DIAGNOSIS — O24415 Gestational diabetes mellitus in pregnancy, controlled by oral hypoglycemic drugs: Secondary | ICD-10-CM

## 2019-06-30 DIAGNOSIS — O0990 Supervision of high risk pregnancy, unspecified, unspecified trimester: Secondary | ICD-10-CM

## 2019-06-30 DIAGNOSIS — R030 Elevated blood-pressure reading, without diagnosis of hypertension: Secondary | ICD-10-CM

## 2019-06-30 DIAGNOSIS — M549 Dorsalgia, unspecified: Secondary | ICD-10-CM

## 2019-06-30 DIAGNOSIS — O0992 Supervision of high risk pregnancy, unspecified, second trimester: Secondary | ICD-10-CM

## 2019-06-30 DIAGNOSIS — Z3A22 22 weeks gestation of pregnancy: Secondary | ICD-10-CM

## 2019-06-30 DIAGNOSIS — O99212 Obesity complicating pregnancy, second trimester: Secondary | ICD-10-CM

## 2019-06-30 DIAGNOSIS — D563 Thalassemia minor: Secondary | ICD-10-CM

## 2019-06-30 DIAGNOSIS — O99210 Obesity complicating pregnancy, unspecified trimester: Secondary | ICD-10-CM

## 2019-06-30 MED ORDER — COMFORT FIT MATERNITY SUPP SM MISC: 0 refills | Status: DC

## 2019-06-30 NOTE — Progress Notes (Signed)
TELEHEALTH OBSTETRICS PRENATAL VIRTUAL VIDEO VISIT ENCOUNTER NOTE  Provider location: [facility] [place]   I connected with [name] on 06/30/19 at  2:00 PM EDT by OB MyChart Video Encounter at home and verified that I am speaking with the correct person using two identifiers.   I discussed the limitations, risks, security and privacy concerns of performing an evaluation and management service virtually and the availability of in person appointments. I also discussed with the patient that there may be a patient responsible charge related to this service. The patient expressed understanding and agreed to proceed. Subjective:  Maureen Lewis is a 23 y.o. G1P0000 at [redacted]w[redacted]d being seen today for ongoing prenatal care.  She is currently monitored for the following issues for this high-risk pregnancy and has Shoulder pain, left; Constipation; GERD (gastroesophageal reflux disease); Back pain; Pseudotumor cerebri syndrome; Obesity hypoventilation syndrome ([place]); Benign paroxysmal positional vertigo due to bilateral vestibular disorder; OSA (obstructive sleep apnea); Hypersomnia with sleep apnea; Female hirsutism; PCOS (polycystic ovarian syndrome); Vitamin D deficiency; Prediabetes; Depression; Class 3 severe obesity with serious comorbidity and body mass index (BMI) of 50.0 to 59.9 in adult [facility]); Anemia; Mass; Vertigo; Bacterial vaginosis; Supervision of normal first pregnancy; Obesity during pregnancy, antepartum; Nausea and vomiting during pregnancy; Gestational diabetes; and Alpha thalassemia trait on their problem list.  Patient reports backache, heartburn and pelvic pressure.  Contractions: Not present. Vag. Bleeding: None.  Movement: Present. Denies any leaking of fluid.   The following portions of the patient's history were reviewed and updated as appropriate: allergies, current medications, past family history, past medical history, past social history, past surgical history  and problem list.   Objective:   Vitals:   06/30/19 1355  BP: (!) 128/91  Pulse: (!) 107    Fetal Status:     Movement: Present     General:  Alert, oriented and cooperative. Patient is in no acute distress.  Respiratory: Normal respiratory effort, no problems with respiration noted  Mental Status: Normal mood and affect. Normal behavior. Normal judgment and thought content.  Rest of physical exam deferred due to type of encounter  Imaging: [name] Mfm Ob Detail +14 Wk  Result Date: 06/06/2019 ----------------------------------------------------------------------  OBSTETRICS REPORT                       (Signed Final 06/06/2019 11:48 am) ---------------------------------------------------------------------- Patient Info  ID #:       [id number]                          D.O.B.:  [birth year] (22 yrs)  Name:       Maureen Lewis                    Visit Date: 06/06/2019 11:26 am ---------------------------------------------------------------------- Performed By  Performed By:     Berlinda Last          Secondary Phy.:   [place]  Attending:        Tama High MD        Address:          [address]  [address]                                                             [place]                                                             [place]  Referred By:      [place]          [facility]                                       [facility]  Ref. Address:     [place] ---------------------------------------------------------------------- Orders   #  Description                          Code         Ordered By   1  [name] MFM OB DETAIL +14 [place]              [id number]     KELLY DAVIS  ----------------------------------------------------------------------   #  Order #                     Accession #                 Episode #   1  [id number]                  [id number]                  [id number]  ---------------------------------------------------------------------- Indications   Encounter for antenatal screening for          Z36.3   malformations (low risk NIPS)   [redacted] weeks gestation of pregnancy                [id number]   Obesity complicating pregnancy, second         O99.212   trimester ( prepreg BMI 51.95)   Gestational diabetes in pregnancy,             O24.415   controlled by oral hypoglycemic drugs   Maternal care for (suspected) hereditary       O35.2XX0   disease in fetus, not applicable or   unspecified (alpha thal carrier)  ---------------------------------------------------------------------- Fetal Evaluation  Num Of Fetuses:         1  Fetal Heart Rate(bpm):  159  Cardiac Activity:       Observed  Presentation:           Breech  Placenta:               Posterior  P. Cord Insertion:      Visualized  Amniotic Fluid  AFI FV:      Within normal limits ---------------------------------------------------------------------- Biometry  BPD:      46.2  mm     G. Age:  20w 0d         73  %    CI:        78.36   %    70 - 86                                                          FL/HC:      16.8   %    16.1 - 18.3  HC:      165.1  mm     G. Age:  19w 2d         33  %    HC/AC:      1.11        1.09 - 1.39  AC:      148.8  mm     G. Age:  20w 1d         69  %    FL/BPD:     60.0   %  FL:       27.7  mm     G. Age:  18w 3d         13  %    FL/AC:      18.6   %    20 - 24  HUM:      27.8  mm     G. Age:  18w 6d         38  %  CER:      19.6  mm     G. Age:  18w 6d         36  %  Est. FW:     289  gm    0 lb 10 oz      42  % ---------------------------------------------------------------------- OB History  Gravidity:    1         Term:   0        Prem:   0        SAB:   0  TOP:          0       Ectopic:  0        Living: 0 ---------------------------------------------------------------------- Gestational  Age  LMP:           19w 3d        Date:  01/21/19                 EDD:   10/28/19  U/S Today:     19w 3d  EDD:   10/28/19  Best:          19w 3d     Det. By:  LMP  (01/21/19)          EDD:   10/28/19 ---------------------------------------------------------------------- Anatomy  Cranium:               Appears normal         Aortic Arch:            Not well visualized  Cavum:                 Appears normal         Ductal Arch:            Not well visualized  Ventricles:            Appears normal         Diaphragm:              Appears normal  Choroid Plexus:        Appears normal         Stomach:                Appears normal, left                                                                        sided  Cerebellum:            Appears normal         Abdomen:                Appears normal  Posterior Fossa:       Appears normal         Abdominal Wall:         Not well visualized  Nuchal Fold:           Appears normal         Cord Vessels:           Not well visualized  Face:                  Appears normal         Kidneys:                RAS seen, kidneys                         (orbits and profile)                           not well seen  Lips:                  Appears normal         Bladder:                Appears normal  Thoracic:              Appears normal         Spine:                  Not well visualized  Heart:  Appears normal         Upper Extremities:      Visualized                         (4CH, axis, and                         situs)  RVOT:                  Not well visualized    Lower Extremities:      Visualized  LVOT:                  Not well visualized  Other:  Heels visualized. Left 5th digit previously seen. Nasal bone          visualized. Technically difficult due to maternal habitus. ---------------------------------------------------------------------- Cervix Uterus Adnexa  Cervix  Length:              3  cm.  Normal appearance by  transabdominal scan. ---------------------------------------------------------------------- Impression  We performed fetal anatomy scan. No makers of  aneuploidies or fetal structural defects are seen. Fetal  biometry is consistent with her previously-established dates.  Amniotic fluid is normal and good fetal activity is seen.  Patient understands the limitations of ultrasound in detecting  fetal anomalies.  Maternal obesity imposes limitations on the resolution of  images, and failure to detect fetal anomalies is more common  in obese pregnant women.  On cell-free fetal DNA screening, the risks of fetal  aneuploidies are not increased.  Patient has gestational diabetes and takes metformin.  Hemoglobin A1C is 5.8% (not consistent with type 2  diabetes). ---------------------------------------------------------------------- Recommendations  -An appointment was made for her to return in 4 weeks for  completion of fetal anatomy. ----------------------------------------------------------------------                  Tama High, MD Electronically Signed Final Report   06/06/2019 11:48 am ----------------------------------------------------------------------   Assessment and Plan:  Pregnancy: G1P0000 at [redacted]w[redacted]d 1. Supervision of high risk pregnancy, antepartum  2. Gestational diabetes mellitus (GDM) controlled on oral hypoglycemic drug, antepartum  3. Elevated blood pressure reading without diagnosis of hypertension - taking Baby ASA  4. Backache symptom Rx: - Elastic Bandages & Supports (COMFORT FIT MATERNITY SUPP SM) MISC; Wear as directed.  Dispense: 1 each; Refill: 0  5. Obesity during pregnancy, antepartum  6. Alpha thalassemia silent carrier   Preterm labor symptoms and general obstetric precautions including but not limited to vaginal bleeding, contractions, leaking of fluid and fetal movement were reviewed in detail with the patient. I discussed the assessment and treatment plan with the  patient. The patient was provided an opportunity to ask questions and all were answered. The patient agreed with the plan and demonstrated an understanding of the instructions. The patient was advised to call back or seek an in-person office evaluation/go to MAU at [facility] for any urgent or concerning symptoms. Please refer to After Visit Summary for other counseling recommendations.   I provided 15 minutes of face-to-face time during this encounter.  Return in about 4 weeks (around 07/28/2019) for MyChart.  Future Appointments  Date Time Provider [place]  07/04/2019 12:45 PM [name] 2 WH-MFCUS MFC-US  07/04/2019 12:50 PM [place] NURSE [place] MFC-US  07/27/2019 10:30 AM Salvadore Dom, MD [place] None    Baltazar Najjar, [place] [facility], [facility]  Group 06/30/2019

## 2019-06-30 NOTE — Progress Notes (Signed)
Virtual ROB   CC: Low pelvic pressure   Sugars have been god per pt . Checked B/P at home.   B/P:128/91 P:107

## 2019-07-04 ENCOUNTER — Other Ambulatory Visit (HOSPITAL_COMMUNITY): Payer: Self-pay | Admitting: *Deleted

## 2019-07-04 ENCOUNTER — Other Ambulatory Visit: Payer: Self-pay

## 2019-07-04 ENCOUNTER — Encounter (HOSPITAL_COMMUNITY): Payer: Self-pay

## 2019-07-04 ENCOUNTER — Ambulatory Visit (HOSPITAL_COMMUNITY): Payer: BC Managed Care – PPO | Admitting: *Deleted

## 2019-07-04 ENCOUNTER — Ambulatory Visit (HOSPITAL_COMMUNITY)
Admission: RE | Admit: 2019-07-04 | Discharge: 2019-07-04 | Disposition: A | Discharge status: Home / Self Care | Payer: BC Managed Care – PPO | Source: Ambulatory Visit | Attending: Obstetrics and Gynecology | Admitting: Obstetrics and Gynecology

## 2019-07-04 VITALS — BP 131/59 | HR 102 | Temp 98.4°F

## 2019-07-04 DIAGNOSIS — Z6841 Body Mass Index (BMI) 40.0 and over, adult: Secondary | ICD-10-CM | POA: Insufficient documentation

## 2019-07-04 DIAGNOSIS — Z362 Encounter for other antenatal screening follow-up: Secondary | ICD-10-CM

## 2019-07-04 DIAGNOSIS — Z3A23 23 weeks gestation of pregnancy: Secondary | ICD-10-CM

## 2019-07-04 DIAGNOSIS — O99212 Obesity complicating pregnancy, second trimester: Secondary | ICD-10-CM | POA: Diagnosis not present

## 2019-07-04 DIAGNOSIS — O352XX0 Maternal care for (suspected) hereditary disease in fetus, not applicable or unspecified: Secondary | ICD-10-CM

## 2019-07-04 DIAGNOSIS — O24415 Gestational diabetes mellitus in pregnancy, controlled by oral hypoglycemic drugs: Secondary | ICD-10-CM | POA: Diagnosis not present

## 2019-07-12 ENCOUNTER — Encounter (HOSPITAL_COMMUNITY): Payer: Self-pay | Admitting: *Deleted

## 2019-07-12 ENCOUNTER — Inpatient Hospital Stay (HOSPITAL_COMMUNITY)
Admission: AD | Admit: 2019-07-12 | Discharge: 2019-07-12 | Disposition: A | Discharge status: Home / Self Care | Payer: BC Managed Care – PPO | Attending: Obstetrics and Gynecology | Admitting: Obstetrics and Gynecology

## 2019-07-12 ENCOUNTER — Other Ambulatory Visit: Payer: Self-pay

## 2019-07-12 ENCOUNTER — Telehealth: Payer: Self-pay

## 2019-07-12 DIAGNOSIS — G43909 Migraine, unspecified, not intractable, without status migrainosus: Secondary | ICD-10-CM | POA: Diagnosis present

## 2019-07-12 DIAGNOSIS — M7989 Other specified soft tissue disorders: Secondary | ICD-10-CM | POA: Diagnosis not present

## 2019-07-12 DIAGNOSIS — Z3402 Encounter for supervision of normal first pregnancy, second trimester: Secondary | ICD-10-CM

## 2019-07-12 DIAGNOSIS — G43019 Migraine without aura, intractable, without status migrainosus: Secondary | ICD-10-CM | POA: Diagnosis not present

## 2019-07-12 DIAGNOSIS — R51 Headache: Secondary | ICD-10-CM | POA: Insufficient documentation

## 2019-07-12 DIAGNOSIS — Z3A24 24 weeks gestation of pregnancy: Secondary | ICD-10-CM | POA: Diagnosis not present

## 2019-07-12 DIAGNOSIS — O26892 Other specified pregnancy related conditions, second trimester: Secondary | ICD-10-CM | POA: Diagnosis present

## 2019-07-12 DIAGNOSIS — N760 Acute vaginitis: Secondary | ICD-10-CM

## 2019-07-12 DIAGNOSIS — G43009 Migraine without aura, not intractable, without status migrainosus: Secondary | ICD-10-CM | POA: Diagnosis present

## 2019-07-12 DIAGNOSIS — B9689 Other specified bacterial agents as the cause of diseases classified elsewhere: Secondary | ICD-10-CM

## 2019-07-12 DIAGNOSIS — R079 Chest pain, unspecified: Secondary | ICD-10-CM | POA: Diagnosis not present

## 2019-07-12 HISTORY — DX: Depression, unspecified: F32.A

## 2019-07-12 HISTORY — DX: Headache, unspecified: R51.9

## 2019-07-12 HISTORY — DX: Unspecified infectious disease: B99.9

## 2019-07-12 LAB — CBC
HCT: 34 % — ABNORMAL LOW (ref 36.0–46.0)
Hemoglobin: 10.5 g/dL — ABNORMAL LOW (ref 12.0–15.0)
MCH: 23.1 pg — ABNORMAL LOW (ref 26.0–34.0)
MCHC: 30.9 g/dL (ref 30.0–36.0)
MCV: 74.9 fL — ABNORMAL LOW (ref 80.0–100.0)
Platelets: 208 10*3/uL (ref 150–400)
RBC: 4.54 MIL/uL (ref 3.87–5.11)
RDW: 14.8 % (ref 11.5–15.5)
WBC: 6 10*3/uL (ref 4.0–10.5)
nRBC: 0 % (ref 0.0–0.2)

## 2019-07-12 LAB — COMPREHENSIVE METABOLIC PANEL
ALT: 19 U/L (ref 0–44)
AST: 15 U/L (ref 15–41)
Albumin: 2.8 g/dL — ABNORMAL LOW (ref 3.5–5.0)
Alkaline Phosphatase: 56 U/L (ref 38–126)
Anion gap: 10 (ref 5–15)
BUN: 5 mg/dL — ABNORMAL LOW (ref 6–20)
CO2: 22 mmol/L (ref 22–32)
Calcium: 8.9 mg/dL (ref 8.9–10.3)
Chloride: 105 mmol/L (ref 98–111)
Creatinine, Ser: 0.46 mg/dL (ref 0.44–1.00)
GFR calc Af Amer: >60 mL/min (ref >60)
GFR calc non Af Amer: >60 mL/min (ref >60)
Glucose, Bld: 88 mg/dL (ref 70–99)
Potassium: 3.9 mmol/L (ref 3.5–5.1)
Sodium: 137 mmol/L (ref 135–145)
Total Bilirubin: <0.1 mg/dL — ABNORMAL LOW (ref 0.3–1.2)
Total Protein: 6 g/dL — ABNORMAL LOW (ref 6.5–8.1)

## 2019-07-12 LAB — URINALYSIS, ROUTINE W REFLEX MICROSCOPIC
Bilirubin Urine: NEGATIVE
Glucose, UA: NEGATIVE mg/dL
Hgb urine dipstick: NEGATIVE
Ketones, ur: NEGATIVE mg/dL
Leukocytes,Ua: NEGATIVE
Nitrite: NEGATIVE
Protein, ur: NEGATIVE mg/dL
Specific Gravity, Urine: 1.023 (ref 1.005–1.030)
pH: 6 (ref 5.0–8.0)

## 2019-07-12 LAB — PROTEIN / CREATININE RATIO, URINE
Creatinine, Urine: 163.63 mg/dL
Protein Creatinine Ratio: 0.06 mg/mg{Cre} (ref 0.00–0.15)
Total Protein, Urine: 10 mg/dL

## 2019-07-12 MED ORDER — METOCLOPRAMIDE HCL 5 MG/ML IJ SOLN
10.0000 mg | Freq: Once | INTRAMUSCULAR | Status: AC
  Administered 2019-07-12: 10 mg via INTRAVENOUS
  Filled 2019-07-12: qty 2

## 2019-07-12 MED ORDER — DIPHENHYDRAMINE HCL 50 MG/ML IJ SOLN
25.0000 mg | Freq: Once | INTRAMUSCULAR | Status: AC
  Administered 2019-07-12: 25 mg via INTRAVENOUS
  Filled 2019-07-12: qty 1

## 2019-07-12 MED ORDER — DEXAMETHASONE SODIUM PHOSPHATE 10 MG/ML IJ SOLN
10.0000 mg | Freq: Once | INTRAMUSCULAR | Status: AC
  Administered 2019-07-12: 10 mg via INTRAVENOUS
  Filled 2019-07-12: qty 1

## 2019-07-12 MED ORDER — CYCLOBENZAPRINE HCL 10 MG PO TABS
10.0000 mg | ORAL_TABLET | Freq: Once | ORAL | Status: AC
  Administered 2019-07-12: 10 mg via ORAL
  Filled 2019-07-12: qty 1

## 2019-07-12 MED ORDER — LACTATED RINGERS IV BOLUS
1000.0000 mL | Freq: Once | INTRAVENOUS | Status: AC
  Administered 2019-07-12: 1000 mL via INTRAVENOUS

## 2019-07-12 MED ORDER — CYCLOBENZAPRINE HCL 10 MG PO TABS: 10.0000 mg | ORAL_TABLET | Freq: Two times a day (BID) | ORAL | 0 refills | Status: DC | PRN

## 2019-07-12 NOTE — MAU Provider Note (Signed)
History     CSN: [id number]  Arrival date and time: 07/12/19 1118   First Provider Initiated Contact with Patient 07/12/19 1250      Chief Complaint  Patient presents with  . Hypertension  . Headache  . Blurred Vision  . Chest Pain   HPI  Maureen Lewis is a 23 y.o. year old G55P0000 female at 28w4dweeks gestation who presents to MAU reporting she has had a "really bad H/A's. She has had H/As for 1 week and she feels like they are getting worse as time goes on. She rates the pain 8/10 at this time. She has not taken any medications for the H/A. She called her OB office and the RN told her to come to MAU for evaluation. She takes her BP at home and reports the BPs she got at home were 134/119oday  and 120/99 yesterday. She complains of blurry vision and swollen feet, "but they aren't that bad right now". She denies epigastric pain. She is cHTN taking bASA daily and Metformin for GDM. [facility]  Past Medical History:  Diagnosis Date  . Abnormal uterine bleeding   . Acid reflux   . Alpha thalassemia trait   . Amenorrhea   . Anemia    no current med.  . Complication of anesthesia    states had to keep giving her anesthesia during EGD  . Constipation   . Depression    "I'm good"  . Diabetes [place]    "prediabetes"  . Finger mass, left 03/2017   middle finger  . Gestational diabetes   . Headache   . Infection    UTI  . Irritable bowel syndrome (IBS)   . Morbid obesity with body mass index (BMI) of 45.0 to 49.9 in adult ([facility]   . Plantar fasciitis, bilateral   . Polycystic ovary syndrome   . Shortness of breath   . Sleep apnea    no CPAP use    Past Surgical History:  Procedure Laterality Date  . COLONOSCOPY WITH PROPOFOL  10/07/2016  . ESOPHAGOGASTRODUODENOSCOPY  12/21/2015  . EXCISION MASS UPPER EXTREMETIES Left 04/21/2017   Procedure: EXCISION MASS LEFT MIDDLE FINGER;  Surgeon: KDaryll Brod MD;  [place]  Service: Orthopedics;  Laterality: Left;    Family History  Problem Relation Age of Onset  . Diabetes Maternal Aunt   . Hypertension Maternal Uncle   . Depression Maternal Grandfather   . Diabetes Maternal Grandfather   . Hypertension Maternal Grandfather   . Arthritis Mother   . High blood pressure Mother   . Depression Mother   . Sleep apnea Mother   . Obesity Mother     Social History   Tobacco Use  . Smoking status: Never Smoker  . Smokeless tobacco: Never Used  Substance Use Topics  . Alcohol use: Not Currently    Comment: social   . Drug use: No    Allergies:  Allergies  Allergen Reactions  . Bee Pollen Hives, Shortness Of Breath and Swelling  . Bee Venom Hives, Shortness Of Breath and Swelling  . Hydrocodone-Acetaminophen Anaphylaxis    No problem when she takes Tylenol  . Peach Flavor Hives, Shortness Of Breath and Other (See Comments)    SWELLING OF MOUTH  . Pollen Extract Hives, Shortness Of Breath and Swelling    Medications Prior to Admission  Medication Sig Dispense Refill Last Dose  . acetaminophen (TYLENOL) 325 MG  tablet Take 650 mg by mouth every 6 (six) hours as needed.   07/11/2019 at Unknown time  . aspirin EC 81 MG tablet Take 1 tablet (81 mg total) by mouth daily. Take after 12 weeks for prevention of preeclampsia later in pregnancy-do not start until 04/17/19 300 tablet 0 07/11/2019 at Unknown time  . loratadine (CLARITIN) 10 MG tablet Take 10 mg by mouth daily as needed for allergies.    Past Week at Unknown time  . metFORMIN (GLUCOPHAGE XR) 500 MG 24 hr tablet Take 2 tablets (1,000 mg total) by mouth 2 (two) times daily after a meal. Take after breakfast and after supper. 60 tablet 5 07/12/2019 at Unknown time  . ondansetron (ZOFRAN ODT) 4 MG disintegrating tablet Take 1 tablet (4 mg total) by mouth every 8 (eight) hours as needed for nausea or vomiting. 30 tablet 1 07/11/2019 at Unknown time  . Prenatal Vit-Fe Fumarate-FA  (PRENATAL VITAMINS PO) Take by mouth.   07/12/2019 at Unknown time  . Accu-Chek FastClix Lancets MISC 1 Units by Percutaneous route 4 (four) times daily. 100 each 12   . Blood Glucose Monitoring Suppl (ACCU-CHEK GUIDE) w/Device KIT 1 Device by Does not apply route 4 (four) times daily. 1 kit 0   . Blood Pressure Monitoring KIT 1 kit by Does not apply route once a week. 1 kit 0   . Elastic Bandages & Supports (COMFORT FIT MATERNITY SUPP SM) MISC Wear as directed. 1 each 0   . glucose blood (ACCU-CHEK GUIDE) test strip Use to check blood sugars four times a day was instructed 50 each 12     Review of Systems  Constitutional: Negative.   HENT: Negative.   Eyes: Positive for photophobia and visual disturbance.  Respiratory: Negative.   Cardiovascular: Positive for leg swelling (feet swelling).  Gastrointestinal: Negative.   Endocrine: Negative.   Genitourinary: Negative.   Musculoskeletal: Negative.   Skin: Negative.   Allergic/Immunologic: Negative.   Neurological: Positive for headaches.  Hematological: Negative.   Psychiatric/Behavioral: Negative.    Physical Exam   Patient Vitals for the past 24 hrs:  BP Temp Temp src Pulse Resp SpO2 Height Weight  07/12/19 1245 127/75 - - 90 - 98 % - -  07/12/19 1231 108/68 - - 97 - - - -  07/12/19 1213 (!) 149/86 - - - 20 99 % - -  07/12/19 1137 (!) 149/77 98.5 F (36.9 C) Oral 89 18 99 % 6' 2.5" (1.892 m) (!) 184.3 kg  07/12/19 1133 - - - - - 99 % - -     Physical Exam  Nursing note and vitals reviewed. Constitutional: She is oriented to person, place, and time. She appears well-developed and well-nourished.  HENT:  Head: Normocephalic and atraumatic.  Eyes: Pupils are equal, round, and reactive to light.  Neck: Normal range of motion.  Cardiovascular: Normal rate.  Respiratory: Effort normal.  GI: Soft.  Genitourinary:    Genitourinary Comments: Dilation: Closed Effacement (%): 0 Cervical Position: Posterior Station:  Ballotable Presentation: Undeterminable Exam by: Sunday Corn, CNM    Musculoskeletal: Normal range of motion.        General: No edema.  Neurological: She is alert and oriented to person, place, and time.  Skin: Skin is warm and dry.  Psychiatric: She has a normal mood and affect. Her behavior is normal. Judgment and thought content normal.   NST - FHR: 150 bpm / moderate variability / accels present / decels absent / TOCO: none  MAU Course  Procedures  MDM Headache Protocol -- no relief with H/A PEC labs Flexeril 10 mg po -- reduced pain from 8 to a 5  Results for orders placed or performed during the hospital encounter of 07/12/19 (from the past 24 hour(s))  Urinalysis, Routine w reflex microscopic     Status: None   Collection Time: 07/12/19  1:30 PM  Result Value Ref Range   Color, Urine YELLOW YELLOW   APPearance CLEAR CLEAR   Specific Gravity, Urine 1.023 1.005 - 1.030   pH 6.0 5.0 - 8.0   Glucose, UA NEGATIVE NEGATIVE mg/dL   Hgb urine dipstick NEGATIVE NEGATIVE   Bilirubin Urine NEGATIVE NEGATIVE   Ketones, ur NEGATIVE NEGATIVE mg/dL   Protein, ur NEGATIVE NEGATIVE mg/dL   Nitrite NEGATIVE NEGATIVE   Leukocytes,Ua NEGATIVE NEGATIVE  Protein / creatinine ratio, urine     Status: None   Collection Time: 07/12/19  1:30 PM  Result Value Ref Range   Creatinine, Urine 163.63 mg/dL   Total Protein, Urine 10 mg/dL   Protein Creatinine Ratio 0.06 0.00 - 0.15 mg/mg[Cre]  CBC     Status: Abnormal   Collection Time: 07/12/19  2:18 PM  Result Value Ref Range   WBC 6.0 4.0 - 10.5 K/uL   RBC 4.54 3.87 - 5.11 MIL/uL   Hemoglobin 10.5 (L) 12.0 - 15.0 g/dL   HCT 34.0 (L) 36.0 - 46.0 %   MCV 74.9 (L) 80.0 - 100.0 fL   MCH 23.1 (L) 26.0 - 34.0 pg   MCHC 30.9 30.0 - 36.0 g/dL   RDW 14.8 11.5 - 15.5 %   Platelets 208 150 - 400 K/uL   nRBC 0.0 0.0 - 0.2 %  Comprehensive metabolic panel     Status: Abnormal   Collection Time: 07/12/19  2:18 PM  Result Value Ref Range   Sodium  137 135 - 145 mmol/L   Potassium 3.9 3.5 - 5.1 mmol/L   Chloride 105 98 - 111 mmol/L   CO2 22 22 - 32 mmol/L   Glucose, Bld 88 70 - 99 mg/dL   BUN 5 (L) 6 - 20 mg/dL   Creatinine, Ser 0.46 0.44 - 1.00 mg/dL   Calcium 8.9 8.9 - 10.3 mg/dL   Total Protein 6.0 (L) 6.5 - 8.1 g/dL   Albumin 2.8 (L) 3.5 - 5.0 g/dL   AST 15 15 - 41 U/L   ALT 19 0 - 44 U/L   Alkaline Phosphatase 56 38 - 126 U/L   Total Bilirubin <0.1 (L) 0.3 - 1.2 mg/dL   GFR calc non Af Amer >60 >60 mL/min   GFR calc Af Amer >60 >60 mL/min   Anion gap 10 5 - 15    Assessment and Plan  Intractable migraine without aura and without status migrainosus  - Rx for Flexeril 10 mg BID sent - Information provided on migraine H/A  - Discharge patient - Keep scheduled appt with [facility] on 07/28/19 - Patient verbalized an understanding of the plan of care and agrees.     Laury Deep, MSN, CNM 07/12/2019, 12:50 PM

## 2019-07-12 NOTE — Telephone Encounter (Signed)
Pt called and reports that she has been having headaches and chest pains for the past couple of days. Pt reports she has also had episodes of dizziness. Pt reports good fetal movement, no bleeding. Pt reported that her BP has been elevated the past couple days as well. I advised pt go to MAU for evaluation, pt verbalizes understanding and says she will go.

## 2019-07-12 NOTE — MAU Note (Addendum)
Been having real bad headaches (hasn't taken anything for it) and sharp pains in chest.  As time has gone one, seems to be getting worse.  Started about a wk ago. Called her dr, nurse called her back. Was told to come over here to be evaluated.  Has been checking BP once a wk, last wk it was 134/119, yesterday was 120/99. +HA, vision has been blurry, denies epigastric pain.  Has been having a lot of swelling in feet.

## 2019-07-20 ENCOUNTER — Ambulatory Visit: Payer: BLUE CROSS/BLUE SHIELD | Admitting: Obstetrics and Gynecology

## 2019-07-22 ENCOUNTER — Encounter: Payer: Self-pay | Admitting: Family Medicine

## 2019-07-27 ENCOUNTER — Ambulatory Visit: Payer: BLUE CROSS/BLUE SHIELD | Admitting: Obstetrics and Gynecology

## 2019-07-28 ENCOUNTER — Encounter: Payer: Self-pay | Admitting: Obstetrics

## 2019-07-28 ENCOUNTER — Telehealth (INDEPENDENT_AMBULATORY_CARE_PROVIDER_SITE_OTHER): Payer: BC Managed Care – PPO | Admitting: Obstetrics

## 2019-07-28 DIAGNOSIS — O24415 Gestational diabetes mellitus in pregnancy, controlled by oral hypoglycemic drugs: Secondary | ICD-10-CM

## 2019-07-28 DIAGNOSIS — O0992 Supervision of high risk pregnancy, unspecified, second trimester: Secondary | ICD-10-CM

## 2019-07-28 DIAGNOSIS — O99210 Obesity complicating pregnancy, unspecified trimester: Secondary | ICD-10-CM

## 2019-07-28 DIAGNOSIS — D563 Thalassemia minor: Secondary | ICD-10-CM

## 2019-07-28 DIAGNOSIS — O0990 Supervision of high risk pregnancy, unspecified, unspecified trimester: Secondary | ICD-10-CM

## 2019-07-28 DIAGNOSIS — Z3A26 26 weeks gestation of pregnancy: Secondary | ICD-10-CM

## 2019-07-28 DIAGNOSIS — O99212 Obesity complicating pregnancy, second trimester: Secondary | ICD-10-CM

## 2019-07-28 NOTE — Progress Notes (Signed)
ROB Virtual Visit with no complaints.   Pt states since starting Metformin sugars have been in 90's.

## 2019-07-28 NOTE — Progress Notes (Signed)
TELEHEALTH OBSTETRICS PRENATAL VIRTUAL VIDEO VISIT ENCOUNTER NOTE  Provider location: [facility] [place] [facility] [place]   I connected with Maureen Lewis on 07/28/19 at  9:30 AM EDT by OB MyChart Video Encounter at home and verified that I am speaking with the correct person using two identifiers.   I discussed the limitations, risks, security and privacy concerns of performing an evaluation and management service virtually and the availability of in person appointments. I also discussed with the patient that there may be a patient responsible charge related to this service. The patient expressed understanding and agreed to proceed. Subjective:  Maureen Lewis is a 23 y.o. G1P0000 at [redacted]w[redacted]d being seen today for ongoing prenatal care.  She is currently monitored for the following issues for this high-risk pregnancy and has Shoulder pain, left; Constipation; GERD (gastroesophageal reflux disease); Back pain; Pseudotumor cerebri syndrome; Obesity hypoventilation syndrome ([place]); Benign paroxysmal positional vertigo due to bilateral vestibular disorder; OSA (obstructive sleep apnea); Hypersomnia with sleep apnea; Female hirsutism; PCOS (polycystic ovarian syndrome); Vitamin D deficiency; Prediabetes; Depression; Class 3 severe obesity with serious comorbidity and body mass index (BMI) of 50.0 to 59.9 in adult [facility]); Anemia; Mass; Vertigo; Bacterial vaginosis; Supervision of normal first pregnancy; Obesity during pregnancy, antepartum; Nausea and vomiting during pregnancy; Gestational diabetes; Alpha thalassemia trait; and Migraine headache without aura on their problem list.  Patient reports heartburn.  Contractions: Not present. Vag. Bleeding: None.  Movement: Present. Denies any leaking of fluid.   The following portions of the patient's history were reviewed and updated as appropriate: allergies, current medications, past family history, past medical history, past social history, past surgical history  and problem list.   Objective:  There were no vitals filed for this visit.  Fetal Status:     Movement: Present     General:  Alert, oriented and cooperative. Patient is in no acute distress.  Respiratory: Normal respiratory effort, no problems with respiration noted  Mental Status: Normal mood and affect. Normal behavior. Normal judgment and thought content.  Rest of physical exam deferred due to type of encounter  Imaging: [name] Mfm Ob Follow Up  Result Date: 07/04/2019 ----------------------------------------------------------------------  OBSTETRICS REPORT                       (Signed Final 07/04/2019 02:39 pm) ---------------------------------------------------------------------- Patient Info  ID #:       [id number]                          D.O.B.:  1996-02-02 (22 yrs)  Name:       [id number] Riemenschneider                    Visit Date: 07/04/2019 12:51 pm ---------------------------------------------------------------------- Performed By  Performed By:     Lavone Nian Phy.:   [place]  Attending:        Johnell Comings MD         Address:          [address]  [address]                                                             [place]                                                             [place]  Referred By:      [place]          [facility]                    MD                                       [facility]  Ref. Address:     [place] ---------------------------------------------------------------------- Orders   #  Description                          Code         Ordered By   1  [name] MFM OB FOLLOW UP                  [id number]     [place]  ----------------------------------------------------------------------   #  Order #                    Accession #                  Episode #   1  [id number]                  [id number]                  [id number]  ---------------------------------------------------------------------- Indications   Obesity complicating pregnancy, second         O99.212   trimester ( prepreg BMI 51.95)   Gestational diabetes in pregnancy,             O24.415   controlled by oral hypoglycemic drugs   Maternal care for (suspected) hereditary       O35.2XX0   disease in fetus, not applicable or   unspecified (alpha thal carrier)   Encounter for other antenatal screening        Z36.2   follow-up (Low Risk NIPS)   [redacted] weeks gestation of pregnancy                Z3A.23  ---------------------------------------------------------------------- Vital Signs  Weight (lb): 407                               Height:        6'2"  BMI:         52.25 ---------------------------------------------------------------------- Fetal Evaluation  Num Of Fetuses:         1  Fetal Heart Rate(bpm):  155  Cardiac Activity:       Observed  Presentation:           Breech  Placenta:               Posterior  P. Cord Insertion:      Previously Visualized  Amniotic Fluid  AFI FV:      Within normal limits                              Largest Pocket(cm)                              6.29 ---------------------------------------------------------------------- Biometry  BPD:      59.3  mm     G. Age:  24w 2d         74  %    CI:        77.46   %    70 - 86                                                          FL/HC:      18.8   %    19.2 - 20.8  HC:      213.3  mm     G. Age:  23w 3d         32  %    HC/AC:      1.14        1.05 - 1.21  AC:       187   mm     G. Age:  23w 3d         16  %    FL/BPD:     67.5   %    71 - 87  FL:         40  mm     G. Age:  22w 6d         22  %    FL/AC:      21.4   %    20 - 24  HUM:      37.6  mm     G. Age:  23w 1d         38  %  LV:        4.5  mm  Est. FW:     581  gm      1 lb 4 oz     35  % ---------------------------------------------------------------------- OB  History  Gravidity:    1         Term:   0        Prem:   0        SAB:   0  TOP:  0       Ectopic:  0        Living: 0 ---------------------------------------------------------------------- Gestational Age  LMP:           23w 3d        Date:  01/21/19                 EDD:   10/28/19  U/S Today:     23w 4d                                        EDD:   10/27/19  Best:          23w 3d     Det. By:  LMP  (01/21/19)          EDD:   10/28/19 ---------------------------------------------------------------------- Anatomy  Cranium:               Appears normal         Aortic Arch:            Appears normal  Cavum:                 Appears normal         Ductal Arch:            Appears normal  Ventricles:            Appears normal         Diaphragm:              Appears normal  Choroid Plexus:        Previously seen        Stomach:                Appears normal, left                                                                        sided  Cerebellum:            Previously seen        Abdomen:                Appears normal  Posterior Fossa:       Previously seen        Abdominal Wall:         Appears nml (cord                                                                        insert, abd wall)  Nuchal Fold:           Previously seen        Cord Vessels:           Appears normal (3  vessel cord)  Face:                  Orbits and profile     Kidneys:                Not well visualized                         previously seen  Lips:                  Previously seen        Bladder:                Appears normal  Thoracic:              Appears normal         Spine:                  Ltd views no                                                                        intracranial signs of                                                                        NTD  Heart:                 Appears normal         Upper Extremities:      Previously seen                          (4CH, axis, and                         situs)  RVOT:                  Appears normal         Lower Extremities:      Previously seen  LVOT:                  Not well visualized  Other:  Technically difficult due to maternal habitus and fetal position. ---------------------------------------------------------------------- Comments  This patient was seen for a follow up growth scan due to  maternal obesity and possible diabetes currently treated with  metformin.  The patient denies any problems since her last  exam.  She was informed that the fetal growth and amniotic fluid  level appears appropriate for her gestational age.  Today's exam continues to be limited due to maternal body  habitus.  There were no obvious anomalies suspected today.  However, not all of the views of the fetal anatomy were  visualized.  The limitations of ultrasound in the detection of all anomalies  was discussed with the patient today.  A follow up exam was scheduled in 4 weeks. ----------------------------------------------------------------------  Johnell Comings, MD Electronically Signed Final Report   07/04/2019 02:39 pm ----------------------------------------------------------------------   Assessment and Plan:  Pregnancy: G1P0000 at [redacted]w[redacted]d 1. Supervision of high risk pregnancy, antepartum  2. Gestational diabetes mellitus (GDM) controlled on oral hypoglycemic drug, antepartum - doing well since starting Metformin - blood sugars between 90-100 mg/dl  3. Obesity during pregnancy, antepartum  4. Alpha thalassemia silent carrier   Preterm labor symptoms and general obstetric precautions including but not limited to vaginal bleeding, contractions, leaking of fluid and fetal movement were reviewed in detail with the patient. I discussed the assessment and treatment plan with the patient. The patient was provided an opportunity to ask questions and all were answered. The patient agreed with  the plan and demonstrated an understanding of the instructions. The patient was advised to call back or seek an in-person office evaluation/go to [facility] at [facility] for any urgent or concerning symptoms. Please refer to After Visit Summary for other counseling recommendations.   I provided 10 minutes of face-to-face time during this encounter.  Return in about 2 weeks (around 08/11/2019) for MyChart.  HOB patient-Faculty only.  Future Appointments  Date Time Provider [place]  08/02/2019 12:45 PM [place] NURSE [place] [facility]  08/02/2019 12:45 PM [name], [place] for [facility] [place] [facility] 07/28/2019

## 2019-08-02 ENCOUNTER — Ambulatory Visit (HOSPITAL_COMMUNITY): Payer: BC Managed Care – PPO | Admitting: *Deleted

## 2019-08-02 ENCOUNTER — Other Ambulatory Visit: Payer: Self-pay

## 2019-08-02 ENCOUNTER — Other Ambulatory Visit (HOSPITAL_COMMUNITY): Payer: Self-pay | Admitting: *Deleted

## 2019-08-02 ENCOUNTER — Encounter (HOSPITAL_COMMUNITY): Payer: Self-pay | Admitting: *Deleted

## 2019-08-02 ENCOUNTER — Ambulatory Visit (HOSPITAL_COMMUNITY)
Admission: RE | Admit: 2019-08-02 | Discharge: 2019-08-02 | Disposition: A | Discharge status: Home / Self Care | Payer: BC Managed Care – PPO | Source: Ambulatory Visit | Attending: Obstetrics and Gynecology | Admitting: Obstetrics and Gynecology

## 2019-08-02 DIAGNOSIS — Z362 Encounter for other antenatal screening follow-up: Secondary | ICD-10-CM | POA: Diagnosis not present

## 2019-08-02 DIAGNOSIS — Z3400 Encounter for supervision of normal first pregnancy, unspecified trimester: Secondary | ICD-10-CM | POA: Insufficient documentation

## 2019-08-02 DIAGNOSIS — G43019 Migraine without aura, intractable, without status migrainosus: Secondary | ICD-10-CM

## 2019-08-02 DIAGNOSIS — O24415 Gestational diabetes mellitus in pregnancy, controlled by oral hypoglycemic drugs: Secondary | ICD-10-CM

## 2019-08-02 DIAGNOSIS — N760 Acute vaginitis: Secondary | ICD-10-CM | POA: Diagnosis present

## 2019-08-02 DIAGNOSIS — B9689 Other specified bacterial agents as the cause of diseases classified elsewhere: Secondary | ICD-10-CM | POA: Insufficient documentation

## 2019-08-02 DIAGNOSIS — O99212 Obesity complicating pregnancy, second trimester: Secondary | ICD-10-CM

## 2019-08-02 DIAGNOSIS — O352XX0 Maternal care for (suspected) hereditary disease in fetus, not applicable or unspecified: Secondary | ICD-10-CM

## 2019-08-02 DIAGNOSIS — Z3A27 27 weeks gestation of pregnancy: Secondary | ICD-10-CM

## 2019-08-11 ENCOUNTER — Telehealth (INDEPENDENT_AMBULATORY_CARE_PROVIDER_SITE_OTHER): Payer: BC Managed Care – PPO | Admitting: Obstetrics and Gynecology

## 2019-08-11 ENCOUNTER — Encounter: Payer: Self-pay | Admitting: Obstetrics and Gynecology

## 2019-08-11 VITALS — BP 126/65 | HR 110

## 2019-08-11 DIAGNOSIS — Z3A28 28 weeks gestation of pregnancy: Secondary | ICD-10-CM

## 2019-08-11 DIAGNOSIS — O99213 Obesity complicating pregnancy, third trimester: Secondary | ICD-10-CM

## 2019-08-11 DIAGNOSIS — O99210 Obesity complicating pregnancy, unspecified trimester: Secondary | ICD-10-CM

## 2019-08-11 DIAGNOSIS — O24415 Gestational diabetes mellitus in pregnancy, controlled by oral hypoglycemic drugs: Secondary | ICD-10-CM

## 2019-08-11 DIAGNOSIS — Z3400 Encounter for supervision of normal first pregnancy, unspecified trimester: Secondary | ICD-10-CM

## 2019-08-11 DIAGNOSIS — Z6841 Body Mass Index (BMI) 40.0 and over, adult: Secondary | ICD-10-CM

## 2019-08-11 DIAGNOSIS — G43019 Migraine without aura, intractable, without status migrainosus: Secondary | ICD-10-CM

## 2019-08-11 MED ORDER — INSULIN NPH (HUMAN) (ISOPHANE) 100 UNIT/ML ~~LOC~~ SUSP: 10.0000 [IU] | Freq: Every day | SUBCUTANEOUS | Status: DC

## 2019-08-11 NOTE — Progress Notes (Signed)
I connected with  Yeilyn Runkel on 08/11/19 by a video enabled telemedicine application and verified that I am speaking with the correct person using two identifiers.  MyChart OB.  Reports no complaints today.

## 2019-08-11 NOTE — Progress Notes (Signed)
TELEHEALTH OBSTETRICS PRENATAL VIRTUAL VIDEO VISIT ENCOUNTER NOTE  Provider location: [facility] [place] [place]   I connected with Maureen Lewis on 08/11/19 at  4:15 PM EDT by MyChart Video Encounter at home and verified that I am speaking with the correct person using two identifiers.   I discussed the limitations, risks, security and privacy concerns of performing an evaluation and management service virtually and the availability of in person appointments. I also discussed with the patient that there may be a patient responsible charge related to this service. The patient expressed understanding and agreed to proceed. Subjective:  Maureen Lewis is a 23 y.o. G1P0000 at [redacted]w[redacted]d being seen today for ongoing prenatal care.  She is currently monitored for the following issues for this high-risk pregnancy and has Shoulder pain, left; Constipation; GERD (gastroesophageal reflux disease); Back pain; Pseudotumor cerebri syndrome; Obesity hypoventilation syndrome ([place]); Benign paroxysmal positional vertigo due to bilateral vestibular disorder; OSA (obstructive sleep apnea); Hypersomnia with sleep apnea; Female hirsutism; PCOS (polycystic ovarian syndrome); Vitamin D deficiency; Prediabetes; Depression; Class 3 severe obesity with serious comorbidity and body mass index (BMI) of 50.0 to 59.9 in adult [facility]); Anemia; Mass; Vertigo; Bacterial vaginosis; Supervision of normal first pregnancy; Obesity during pregnancy, antepartum; Nausea and vomiting during pregnancy; Gestational diabetes; Alpha thalassemia trait; and Migraine headache without aura on their problem list.  Patient reports no complaints.  Contractions: Not present. Vag. Bleeding: None.  Movement: Present. Denies any leaking of fluid.   The following portions of the patient's history were reviewed and updated as appropriate: allergies, current medications, past family history, past medical history, past social history, past surgical  history and problem list.   Objective:   Vitals:   08/11/19 1551  BP: 126/65  Pulse: (!) 110    Fetal Status:     Movement: Present     General:  Alert, oriented and cooperative. Patient is in no acute distress.  Respiratory: Normal respiratory effort, no problems with respiration noted  Mental Status: Normal mood and affect. Normal behavior. Normal judgment and thought content.  Rest of physical exam deferred due to type of encounter  Imaging:  Assessment and Plan:  Pregnancy: G1P0000 at [redacted]w[redacted]d  1. Supervision of normal first pregnancy, antepartum  2. Obesity during pregnancy, antepartum Last weight from 03/2019 Need in person visit to get accurate weight/height Last BMI in 69s  3. Gestational diabetes mellitus (GDM) in third trimester controlled on oral hypoglycemic drug Metformin 1000 mg BID FG: 90-110s PP: 124, 159, 99, 133, 105, 97, 182, 103, 101 Will start NPH 10 units QHS, sent to pharmacy, patient understands not to take until she has had insulin teaching - cont metformin  Preterm labor symptoms and general obstetric precautions including but not limited to vaginal bleeding, contractions, leaking of fluid and fetal movement were reviewed in detail with the patient. I discussed the assessment and treatment plan with the patient. The patient was provided an opportunity to ask questions and all were answered. The patient agreed with the plan and demonstrated an understanding of the instructions. The patient was advised to call back or seek an in-person office evaluation/go to [facility] at [facility] for any urgent or concerning symptoms. Please refer to After Visit Summary for other counseling recommendations.   I provided 15 minutes of face-to-face time during this encounter.  Return in about 2 weeks (around 08/25/2019) for high OB, in person.  Future Appointments  Date Time Provider [place]  08/25/2019  4:15 PM  Chancy Milroy, MD [place] None   08/30/2019  9:15 AM [facility] NURSE [place] [facility]  08/30/2019  9:15 AM [name], [place] for [facility] [place]

## 2019-08-16 ENCOUNTER — Telehealth: Payer: Self-pay

## 2019-08-16 NOTE — Telephone Encounter (Signed)
Returned call, Pt states that insulin is not at the pharmacy, advised that I would message provider.

## 2019-08-17 ENCOUNTER — Other Ambulatory Visit: Payer: Self-pay

## 2019-08-17 ENCOUNTER — Other Ambulatory Visit: Payer: Self-pay | Admitting: Obstetrics and Gynecology

## 2019-08-17 ENCOUNTER — Telehealth: Payer: Self-pay

## 2019-08-17 MED ORDER — INSULIN NPH (HUMAN) (ISOPHANE) 100 UNIT/ML ~~LOC~~ SUSP: 10.0000 [IU] | Freq: Every day | SUBCUTANEOUS | 3 refills | Status: DC

## 2019-08-17 NOTE — Progress Notes (Signed)
Insulin resent to pharmacy.

## 2019-08-17 NOTE — Telephone Encounter (Signed)
Returned call, left vm advising that provider sent rx for insulin.

## 2019-08-23 ENCOUNTER — Telehealth: Payer: Self-pay | Admitting: *Deleted

## 2019-08-23 ENCOUNTER — Telehealth (INDEPENDENT_AMBULATORY_CARE_PROVIDER_SITE_OTHER): Payer: BC Managed Care – PPO

## 2019-08-23 ENCOUNTER — Other Ambulatory Visit: Payer: Self-pay | Admitting: Obstetrics and Gynecology

## 2019-08-23 DIAGNOSIS — Z3400 Encounter for supervision of normal first pregnancy, unspecified trimester: Secondary | ICD-10-CM

## 2019-08-23 MED ORDER — INSULIN NPH (HUMAN) (ISOPHANE) 100 UNIT/ML ~~LOC~~ SUSP: 12.0000 [IU] | Freq: Every day | SUBCUTANEOUS | 3 refills | Status: DC

## 2019-08-23 NOTE — Telephone Encounter (Addendum)
-----   Message from Sloan Leiter, MD sent at 08/23/2019 10:25 AM EST ----- Please let patient know to increase her nightly NPH to 12 units. Thanks.  Pt called and notified to increase her nightly insulin NPH (Novolin N) to 12 units. Pt verbalizes understanding and does not have any questions.

## 2019-08-23 NOTE — Telephone Encounter (Signed)
I was able to speak to patient regarding the problem with the insulin instruction appointment last week and to follow up with her technique and blood sugar review.  She verbalized her steps for drawing up and giving her insulin, all was accurate. She is giving the insulin in her arm, I informed her of the areas of her stomach and upper thighs that could also be used.   I informed her that the insulin does not have to be refrigerated once it is opened and is good for 30 days once it is opened.  She reports she is taking 10 units at night but her morning blood sugars are still in the low 100's. I told her I would notify Dr. Fulton Reek so insulin adjustment can be made.   I reminded her of the importance of putting her blood sugars into [employer] and she stated she would start doing that again.  No other questions offered by the patient.   Plan follow up as needed.

## 2019-08-23 NOTE — Telephone Encounter (Signed)
Error, charted correctly on previous note.

## 2019-08-23 NOTE — Telephone Encounter (Signed)
Agree with above, will adjust insulin regimen.     Feliz Beam, M.D. Attending [facility] [employer] manager)

## 2019-08-23 NOTE — Progress Notes (Signed)
NPH QHS increased to 12 units nightly.

## 2019-08-25 ENCOUNTER — Other Ambulatory Visit: Payer: Self-pay

## 2019-08-25 ENCOUNTER — Ambulatory Visit (INDEPENDENT_AMBULATORY_CARE_PROVIDER_SITE_OTHER): Payer: BC Managed Care – PPO | Admitting: Obstetrics and Gynecology

## 2019-08-25 ENCOUNTER — Encounter: Payer: Self-pay | Admitting: Obstetrics and Gynecology

## 2019-08-25 VITALS — BP 102/64 | HR 102 | Wt >= 6400 oz

## 2019-08-25 DIAGNOSIS — O24414 Gestational diabetes mellitus in pregnancy, insulin controlled: Secondary | ICD-10-CM

## 2019-08-25 DIAGNOSIS — Z6841 Body Mass Index (BMI) 40.0 and over, adult: Secondary | ICD-10-CM

## 2019-08-25 DIAGNOSIS — Z3A30 30 weeks gestation of pregnancy: Secondary | ICD-10-CM

## 2019-08-25 DIAGNOSIS — O99213 Obesity complicating pregnancy, third trimester: Secondary | ICD-10-CM

## 2019-08-25 DIAGNOSIS — D563 Thalassemia minor: Secondary | ICD-10-CM

## 2019-08-25 DIAGNOSIS — Z23 Encounter for immunization: Secondary | ICD-10-CM | POA: Diagnosis not present

## 2019-08-25 DIAGNOSIS — Z3403 Encounter for supervision of normal first pregnancy, third trimester: Secondary | ICD-10-CM

## 2019-08-25 DIAGNOSIS — O26893 Other specified pregnancy related conditions, third trimester: Secondary | ICD-10-CM

## 2019-08-25 NOTE — Patient Instructions (Signed)
Third Trimester of Pregnancy The third trimester is from week 28 through week 40 (months 7 through 9). The third trimester is a time when the unborn baby (fetus) is growing rapidly. At the end of the ninth month, the fetus is about 20 inches in length and weighs 6-10 pounds. Body changes during your third trimester Your body will continue to go through many changes during pregnancy. The changes vary from woman to woman. During the third trimester:  Your weight will continue to increase. You can expect to gain 25-35 pounds (11-16 kg) by the end of the pregnancy.  You may begin to get stretch marks on your hips, abdomen, and breasts.  You may urinate more often because the fetus is moving lower into your pelvis and pressing on your bladder.  You may develop or continue to have heartburn. This is caused by increased hormones that slow down muscles in the digestive tract.  You may develop or continue to have constipation because increased hormones slow digestion and cause the muscles that push waste through your intestines to relax.  You may develop hemorrhoids. These are swollen veins (varicose veins) in the rectum that can itch or be painful.  You may develop swollen, bulging veins (varicose veins) in your legs.  You may have increased body aches in the pelvis, back, or thighs. This is due to weight gain and increased hormones that are relaxing your joints.  You may have changes in your hair. These can include thickening of your hair, rapid growth, and changes in texture. Some women also have hair loss during or after pregnancy, or hair that feels dry or thin. Your hair will most likely return to normal after your baby is born.  Your breasts will continue to grow and they will continue to become tender. A yellow fluid (colostrum) may leak from your breasts. This is the first milk you are producing for your baby.  Your belly button may stick out.  You may notice more swelling in your hands,  face, or ankles.  You may have increased tingling or numbness in your hands, arms, and legs. The skin on your belly may also feel numb.  You may feel short of breath because of your expanding uterus.  You may have more problems sleeping. This can be caused by the size of your belly, increased need to urinate, and an increase in your body's metabolism.  You may notice the fetus "dropping," or moving lower in your abdomen (lightening).  You may have increased vaginal discharge.  You may notice your joints feel loose and you may have pain around your pelvic bone. What to expect at prenatal visits You will have prenatal exams every 2 weeks until week 36. Then you will have weekly prenatal exams. During a routine prenatal visit:  You will be weighed to make sure you and the baby are growing normally.  Your blood pressure will be taken.  Your abdomen will be measured to track your baby's growth.  The fetal heartbeat will be listened to.  Any test results from the previous visit will be discussed.  You may have a cervical check near your due date to see if your cervix has softened or thinned (effaced).  You will be tested for Group B streptococcus. This happens between 35 and 37 weeks. Your health care provider may ask you:  What your birth plan is.  How you are feeling.  If you are feeling the baby move.  If you have had any abnormal   symptoms, such as leaking fluid, bleeding, severe headaches, or abdominal cramping.  If you are using any tobacco products, including cigarettes, chewing tobacco, and electronic cigarettes.  If you have any questions. Other tests or screenings that may be performed during your third trimester include:  Blood tests that check for low iron levels (anemia).  Fetal testing to check the health, activity level, and growth of the fetus. Testing is done if you have certain medical conditions or if there are problems during the pregnancy.  Nonstress test  (NST). This test checks the health of your baby to make sure there are no signs of problems, such as the baby not getting enough oxygen. During this test, a belt is placed around your belly. The baby is made to move, and its heart rate is monitored during movement. What is false labor? False labor is a condition in which you feel small, irregular tightenings of the muscles in the womb (contractions) that usually go away with rest, changing position, or drinking water. These are called Braxton Hicks contractions. Contractions may last for hours, days, or even weeks before true labor sets in. If contractions come at regular intervals, become more frequent, increase in intensity, or become painful, you should see your health care provider. What are the signs of labor?  Abdominal cramps.  Regular contractions that start at 10 minutes apart and become stronger and more frequent with time.  Contractions that start on the top of the uterus and spread down to the lower abdomen and back.  Increased pelvic pressure and dull back pain.  A watery or bloody mucus discharge that comes from the vagina.  Leaking of amniotic fluid. This is also known as your "water breaking." It could be a slow trickle or a gush. Let your health care provider know if it has a color or strange odor. If you have any of these signs, call your health care provider right away, even if it is before your due date. Follow these instructions at home: Medicines  Follow your health care provider's instructions regarding medicine use. Specific medicines may be either safe or unsafe to take during pregnancy.  Take a prenatal vitamin that contains at least 600 micrograms (mcg) of folic acid.  If you develop constipation, try taking a stool softener if your health care provider approves. Eating and drinking   Eat a balanced diet that includes fresh fruits and vegetables, whole grains, good sources of protein such as meat, eggs, or tofu,  and low-fat dairy. Your health care provider will help you determine the amount of weight gain that is right for you.  Avoid raw meat and uncooked cheese. These carry germs that can cause birth defects in the baby.  If you have low calcium intake from food, talk to your health care provider about whether you should take a daily calcium supplement.  Eat four or five small meals rather than three large meals a day.  Limit foods that are high in fat and processed sugars, such as fried and sweet foods.  To prevent constipation: ? Drink enough fluid to keep your urine clear or pale yellow. ? Eat foods that are high in fiber, such as fresh fruits and vegetables, whole grains, and beans. Activity  Exercise only as directed by your health care provider. Most women can continue their usual exercise routine during pregnancy. Try to exercise for 30 minutes at least 5 days a week. Stop exercising if you experience uterine contractions.  Avoid heavy lifting.  Do   not exercise in extreme heat or humidity, or at high altitudes.  Wear low-heel, comfortable shoes.  Practice good posture.  You may continue to have sex unless your health care provider tells you otherwise. Relieving pain and discomfort  Take frequent breaks and rest with your legs elevated if you have leg cramps or low back pain.  Take warm sitz baths to soothe any pain or discomfort caused by hemorrhoids. Use hemorrhoid cream if your health care provider approves.  Wear a good support bra to prevent discomfort from breast tenderness.  If you develop varicose veins: ? Wear support pantyhose or compression stockings as told by your healthcare provider. ? Elevate your feet for 15 minutes, 3-4 times a day. Prenatal care  Write down your questions. Take them to your prenatal visits.  Keep all your prenatal visits as told by your health care provider. This is important. Safety  Wear your seat belt at all times when driving.  Make  a list of emergency phone numbers, including numbers for family, friends, the hospital, and police and fire departments. General instructions  Avoid cat litter boxes and soil used by cats. These carry germs that can cause birth defects in the baby. If you have a cat, ask someone to clean the litter box for you.  Do not travel far distances unless it is absolutely necessary and only with the approval of your health care provider.  Do not use hot tubs, steam rooms, or saunas.  Do not drink alcohol.  Do not use any products that contain nicotine or tobacco, such as cigarettes and e-cigarettes. If you need help quitting, ask your health care provider.  Do not use any medicinal herbs or unprescribed drugs. These chemicals affect the formation and growth of the baby.  Do not douche or use tampons or scented sanitary pads.  Do not cross your legs for long periods of time.  To prepare for the arrival of your baby: ? Take prenatal classes to understand, practice, and ask questions about labor and delivery. ? Make a trial run to the hospital. ? Visit the hospital and tour the maternity area. ? Arrange for maternity or paternity leave through employers. ? Arrange for family and friends to take care of pets while you are in the hospital. ? Purchase a rear-facing car seat and make sure you know how to install it in your car. ? Pack your hospital bag. ? Prepare the baby's nursery. Make sure to remove all pillows and stuffed animals from the baby's crib to prevent suffocation.  Visit your dentist if you have not gone during your pregnancy. Use a soft toothbrush to brush your teeth and be gentle when you floss. Contact a health care provider if:  You are unsure if you are in labor or if your water has broken.  You become dizzy.  You have mild pelvic cramps, pelvic pressure, or nagging pain in your abdominal area.  You have lower back pain.  You have persistent nausea, vomiting, or diarrhea.   You have an unusual or bad smelling vaginal discharge.  You have pain when you urinate. Get help right away if:  Your water breaks before 37 weeks.  You have regular contractions less than 5 minutes apart before 37 weeks.  You have a fever.  You are leaking fluid from your vagina.  You have spotting or bleeding from your vagina.  You have severe abdominal pain or cramping.  You have rapid weight loss or weight gain.  You have  shortness of breath with chest pain.  You notice sudden or extreme swelling of your face, hands, ankles, feet, or legs.  Your baby makes fewer than 10 movements in 2 hours.  You have severe headaches that do not go away when you take medicine.  You have vision changes. Summary  The third trimester is from week 28 through week 40, months 7 through 9. The third trimester is a time when the unborn baby (fetus) is growing rapidly.  During the third trimester, your discomfort may increase as you and your baby continue to gain weight. You may have abdominal, leg, and back pain, sleeping problems, and an increased need to urinate.  During the third trimester your breasts will keep growing and they will continue to become tender. A yellow fluid (colostrum) may leak from your breasts. This is the first milk you are producing for your baby.  False labor is a condition in which you feel small, irregular tightenings of the muscles in the womb (contractions) that eventually go away. These are called Braxton Hicks contractions. Contractions may last for hours, days, or even weeks before true labor sets in.  Signs of labor can include: abdominal cramps; regular contractions that start at 10 minutes apart and become stronger and more frequent with time; watery or bloody mucus discharge that comes from the vagina; increased pelvic pressure and dull back pain; and leaking of amniotic fluid. This information is not intended to replace advice given to you by your health  care provider. Make sure you discuss any questions you have with your health care provider. Document Released: 09/30/2001 Document Revised: 01/27/2019 Document Reviewed: 11/11/2016 Elsevier Patient Education  2020 [employer]  

## 2019-08-25 NOTE — Progress Notes (Signed)
Subjective:  Maureen Lewis is a 23 y.o. G1P0000 at [redacted]w[redacted]d being seen today for ongoing prenatal care.  She is currently monitored for the following issues for this high-risk pregnancy and has Shoulder pain, left; Constipation; GERD (gastroesophageal reflux disease); Back pain; Pseudotumor cerebri syndrome; Obesity hypoventilation syndrome ([place]); Benign paroxysmal positional vertigo due to bilateral vestibular disorder; OSA (obstructive sleep apnea); Hypersomnia with sleep apnea; Female hirsutism; PCOS (polycystic ovarian syndrome); Vitamin D deficiency; Prediabetes; Depression; Class 3 severe obesity with serious comorbidity and body mass index (BMI) of 50.0 to 59.9 in adult [facility]); Anemia; Mass; Vertigo; Supervision of normal first pregnancy; Obesity during pregnancy, antepartum; Nausea and vomiting during pregnancy; Gestational diabetes; Alpha thalassemia trait; and Migraine headache without aura on their problem list.  Patient reports no complaints.  Contractions: Irritability. Vag. Bleeding: None.  Movement: Present. Denies leaking of fluid.   The following portions of the patient's history were reviewed and updated as appropriate: allergies, current medications, past family history, past medical history, past social history, past surgical history and problem list. Problem list updated.  Objective:   Vitals:   08/25/19 1624  BP: 102/64  Pulse: (!) 102  Weight: (!) 411 lb (186.4 kg)    Fetal Status: Fetal Heart Rate (bpm): 155   Movement: Present     General:  Alert, oriented and cooperative. Patient is in no acute distress.  Skin: Skin is warm and dry. No rash noted.   Cardiovascular: Normal heart rate noted  Respiratory: Normal respiratory effort, no problems with respiration noted  Abdomen: Soft, gravid, appropriate for gestational age. Pain/Pressure: Absent     Pelvic:  Cervical exam deferred        Extremities: Normal range of motion.  Edema: Trace  Mental Status: Normal mood and  affect. Normal behavior. Normal judgment and thought content.   Urinalysis:      Assessment and Plan:  Pregnancy: G1P0000 at [redacted]w[redacted]d  1. Encounter for supervision of normal first pregnancy in third trimester Stable - CBC - RPR - HIV antibody Declines flu vaccine Tdap today  2. Insulin controlled gestational diabetes mellitus (GDM) in third trimester CBG's in gaol range with NPH 12 units at bedtime Growth scan next week Start anetnatal testing at 32 weeks  3. Class 3 severe obesity due to excess calories with serious comorbidity and body mass index (BMI) of 50.0 to 59.9 in adult ([facility]CC) Stable  4. Alpha thalassemia trait Stable  Preterm labor symptoms and general obstetric precautions including but not limited to vaginal bleeding, contractions, leaking of fluid and fetal movement were reviewed in detail with the patient. Please refer to After Visit Summary for other counseling recommendations.  Return in about 2 weeks (around 09/08/2019) for OB visit, fcae to face, MD provider.   Chancy Milroy, MD

## 2019-08-25 NOTE — Addendum Note (Signed)
Addended by: Delrae Alfred on: 08/25/2019 05:09 PM   Modules accepted: Orders

## 2019-08-26 LAB — CBC
Hematocrit: 31.3 % — ABNORMAL LOW (ref 34.0–46.6)
Hemoglobin: 10.2 g/dL — ABNORMAL LOW (ref 11.1–15.9)
MCH: 22.2 pg — ABNORMAL LOW (ref 26.6–33.0)
MCHC: 32.6 g/dL (ref 31.5–35.7)
MCV: 68 fL — ABNORMAL LOW (ref 79–97)
Platelets: 235 10*3/uL (ref 150–450)
RBC: 4.6 x10E6/uL (ref 3.77–5.28)
RDW: 14.7 % (ref 11.7–15.4)
WBC: 7.2 10*3/uL (ref 3.4–10.8)

## 2019-08-26 LAB — HIV ANTIBODY (ROUTINE TESTING W REFLEX): HIV Screen 4th Generation wRfx: NONREACTIVE

## 2019-08-26 LAB — RPR: RPR Ser Ql: NONREACTIVE

## 2019-08-30 ENCOUNTER — Ambulatory Visit (HOSPITAL_COMMUNITY)
Admission: RE | Admit: 2019-08-30 | Discharge: 2019-08-30 | Disposition: A | Discharge status: Home / Self Care | Payer: BC Managed Care – PPO | Source: Ambulatory Visit | Attending: Maternal & Fetal Medicine | Admitting: Maternal & Fetal Medicine

## 2019-08-30 ENCOUNTER — Ambulatory Visit (HOSPITAL_COMMUNITY): Payer: BC Managed Care – PPO | Admitting: *Deleted

## 2019-08-30 ENCOUNTER — Other Ambulatory Visit: Payer: Self-pay

## 2019-08-30 ENCOUNTER — Other Ambulatory Visit (HOSPITAL_COMMUNITY): Payer: Self-pay | Admitting: *Deleted

## 2019-08-30 ENCOUNTER — Encounter (HOSPITAL_COMMUNITY): Payer: Self-pay

## 2019-08-30 VITALS — BP 135/63 | HR 100 | Temp 98.7°F

## 2019-08-30 DIAGNOSIS — O24414 Gestational diabetes mellitus in pregnancy, insulin controlled: Secondary | ICD-10-CM | POA: Diagnosis not present

## 2019-08-30 DIAGNOSIS — O0990 Supervision of high risk pregnancy, unspecified, unspecified trimester: Secondary | ICD-10-CM

## 2019-08-30 DIAGNOSIS — O99213 Obesity complicating pregnancy, third trimester: Secondary | ICD-10-CM | POA: Diagnosis not present

## 2019-08-30 DIAGNOSIS — O24415 Gestational diabetes mellitus in pregnancy, controlled by oral hypoglycemic drugs: Secondary | ICD-10-CM | POA: Diagnosis not present

## 2019-08-30 DIAGNOSIS — Z3A31 31 weeks gestation of pregnancy: Secondary | ICD-10-CM

## 2019-08-30 DIAGNOSIS — Z362 Encounter for other antenatal screening follow-up: Secondary | ICD-10-CM | POA: Diagnosis not present

## 2019-08-30 DIAGNOSIS — O352XX0 Maternal care for (suspected) hereditary disease in fetus, not applicable or unspecified: Secondary | ICD-10-CM

## 2019-09-06 ENCOUNTER — Ambulatory Visit (HOSPITAL_COMMUNITY): Payer: BC Managed Care – PPO | Admitting: *Deleted

## 2019-09-06 ENCOUNTER — Ambulatory Visit (HOSPITAL_COMMUNITY)
Admission: RE | Admit: 2019-09-06 | Discharge: 2019-09-06 | Disposition: A | Discharge status: Home / Self Care | Payer: BC Managed Care – PPO | Source: Ambulatory Visit | Attending: Obstetrics and Gynecology | Admitting: Obstetrics and Gynecology

## 2019-09-06 ENCOUNTER — Other Ambulatory Visit: Payer: Self-pay

## 2019-09-06 ENCOUNTER — Encounter (HOSPITAL_COMMUNITY): Payer: Self-pay | Admitting: *Deleted

## 2019-09-06 VITALS — BP 123/63 | HR 109 | Temp 98.3°F

## 2019-09-06 DIAGNOSIS — O24414 Gestational diabetes mellitus in pregnancy, insulin controlled: Secondary | ICD-10-CM

## 2019-09-06 DIAGNOSIS — O24415 Gestational diabetes mellitus in pregnancy, controlled by oral hypoglycemic drugs: Secondary | ICD-10-CM | POA: Insufficient documentation

## 2019-09-06 DIAGNOSIS — O99213 Obesity complicating pregnancy, third trimester: Secondary | ICD-10-CM

## 2019-09-06 DIAGNOSIS — Z3A32 32 weeks gestation of pregnancy: Secondary | ICD-10-CM

## 2019-09-08 ENCOUNTER — Other Ambulatory Visit: Payer: Self-pay

## 2019-09-08 ENCOUNTER — Encounter: Payer: Self-pay | Admitting: Obstetrics and Gynecology

## 2019-09-08 ENCOUNTER — Encounter: Payer: BC Managed Care – PPO | Admitting: Obstetrics and Gynecology

## 2019-09-08 ENCOUNTER — Ambulatory Visit (INDEPENDENT_AMBULATORY_CARE_PROVIDER_SITE_OTHER): Payer: BC Managed Care – PPO | Admitting: Obstetrics and Gynecology

## 2019-09-08 VITALS — BP 149/76 | HR 103 | Wt >= 6400 oz

## 2019-09-08 DIAGNOSIS — Z3A32 32 weeks gestation of pregnancy: Secondary | ICD-10-CM

## 2019-09-08 DIAGNOSIS — O24410 Gestational diabetes mellitus in pregnancy, diet controlled: Secondary | ICD-10-CM

## 2019-09-08 DIAGNOSIS — Z3403 Encounter for supervision of normal first pregnancy, third trimester: Secondary | ICD-10-CM

## 2019-09-08 DIAGNOSIS — O99213 Obesity complicating pregnancy, third trimester: Secondary | ICD-10-CM

## 2019-09-08 DIAGNOSIS — O99210 Obesity complicating pregnancy, unspecified trimester: Secondary | ICD-10-CM

## 2019-09-08 DIAGNOSIS — R030 Elevated blood-pressure reading, without diagnosis of hypertension: Secondary | ICD-10-CM

## 2019-09-08 MED ORDER — INSULIN NPH (HUMAN) (ISOPHANE) 100 UNIT/ML ~~LOC~~ SUSP: 14.0000 [IU] | Freq: Every day | SUBCUTANEOUS | 3 refills | Status: DC

## 2019-09-08 NOTE — Progress Notes (Signed)
   PRENATAL VISIT NOTE  Subjective:  Maureen Lewis is a 23 y.o. G1P0000 at [redacted]w[redacted]d being seen today for ongoing prenatal care.  She is currently monitored for the following issues for this high-risk pregnancy and has Shoulder pain, left; Constipation; GERD (gastroesophageal reflux disease); Back pain; Pseudotumor cerebri syndrome; Obesity hypoventilation syndrome ([place]); Benign paroxysmal positional vertigo due to bilateral vestibular disorder; OSA (obstructive sleep apnea); Hypersomnia with sleep apnea; Female hirsutism; PCOS (polycystic ovarian syndrome); Vitamin D deficiency; Prediabetes; Depression; Class 3 severe obesity with serious comorbidity and body mass index (BMI) of 50.0 to 59.9 in adult [facility]); Anemia; Mass; Vertigo; Supervision of normal first pregnancy; Obesity during pregnancy, antepartum; Nausea and vomiting during pregnancy; Gestational diabetes; Alpha thalassemia trait; and Migraine headache without aura on their problem list.  Patient reports no complaints.  Contractions: Irritability. Vag. Bleeding: None.  Movement: Present. Denies leaking of fluid.   The following portions of the patient's history were reviewed and updated as appropriate: allergies, current medications, past family history, past medical history, past social history, past surgical history and problem list.   Objective:   Vitals:   09/08/19 0826  BP: (!) 149/76  Pulse: (!) 103  Weight: (!) 405 lb (183.7 kg)    Fetal Status: Fetal Heart Rate (bpm): 165   Movement: Present     General:  Alert, oriented and cooperative. Patient is in no acute distress.  Skin: Skin is warm and dry. No rash noted.   Cardiovascular: Normal heart rate noted  Respiratory: Normal respiratory effort, no problems with respiration noted  Abdomen: Soft, gravid, appropriate for gestational age.  Pain/Pressure: Present     Pelvic: Cervical exam deferred        Extremities: Normal range of motion.  Edema: Trace  Mental Status: Normal mood  and affect. Normal behavior. Normal judgment and thought content.   Assessment and Plan:  Pregnancy: G1P0000 at [redacted]w[redacted]d  1. Encounter for supervision of normal first pregnancy in third trimester  2. Obesity during pregnancy, antepartum Cont baby ASA  3. Insulin controlled gestational diabetes mellitus (GDM) in third trimester Metformin BID Insulin 12 units QHS FG: about half < 95 PP: 125-130 Last growth 94%tile Increase NPH to 14 units QHS  4. Elevated blood pressure reading without diagnosis of hypertension Elevated reading today Will monitor closely   Preterm labor symptoms and general obstetric precautions including but not limited to vaginal bleeding, contractions, leaking of fluid and fetal movement were reviewed in detail with the patient. Please refer to After Visit Summary for other counseling recommendations.   Return in about 2 weeks (around 09/22/2019) for high OB, in person.  Future Appointments  Date Time Provider [place]  09/08/2019  9:15 AM Sloan Leiter, MD [facility] None  09/13/2019  3:45 PM [name] 2 WH-MFCUS MFC-US  09/13/2019  3:50 PM [place] NURSE [place] MFC-US  09/19/2019  1:45 PM [name] 5 WH-MFCUS MFC-US  09/19/2019  2:00 PM [place] NURSE [place] MFC-US  09/27/2019  3:45 PM [name] 2 WH-MFCUS MFC-US  09/27/2019  3:50 PM [place] MFC-US    Sloan Leiter, MD

## 2019-09-08 NOTE — Addendum Note (Signed)
Addended by: Vivien Rota on: 09/08/2019 09:05 AM   Modules accepted: Orders

## 2019-09-13 ENCOUNTER — Other Ambulatory Visit: Payer: Self-pay

## 2019-09-13 ENCOUNTER — Ambulatory Visit (HOSPITAL_COMMUNITY)
Admission: RE | Admit: 2019-09-13 | Discharge: 2019-09-13 | Disposition: A | Discharge status: Home / Self Care | Payer: BC Managed Care – PPO | Source: Ambulatory Visit | Attending: Obstetrics and Gynecology | Admitting: Obstetrics and Gynecology

## 2019-09-13 ENCOUNTER — Ambulatory Visit (HOSPITAL_COMMUNITY): Payer: BC Managed Care – PPO | Admitting: *Deleted

## 2019-09-13 ENCOUNTER — Encounter (HOSPITAL_COMMUNITY): Payer: Self-pay | Admitting: *Deleted

## 2019-09-13 DIAGNOSIS — Z3403 Encounter for supervision of normal first pregnancy, third trimester: Secondary | ICD-10-CM | POA: Insufficient documentation

## 2019-09-13 DIAGNOSIS — O24414 Gestational diabetes mellitus in pregnancy, insulin controlled: Secondary | ICD-10-CM | POA: Diagnosis not present

## 2019-09-13 DIAGNOSIS — O99213 Obesity complicating pregnancy, third trimester: Secondary | ICD-10-CM

## 2019-09-13 DIAGNOSIS — Z3A33 33 weeks gestation of pregnancy: Secondary | ICD-10-CM

## 2019-09-13 DIAGNOSIS — O24415 Gestational diabetes mellitus in pregnancy, controlled by oral hypoglycemic drugs: Secondary | ICD-10-CM | POA: Insufficient documentation

## 2019-09-19 ENCOUNTER — Ambulatory Visit (HOSPITAL_COMMUNITY)
Admission: RE | Admit: 2019-09-19 | Discharge: 2019-09-19 | Disposition: A | Discharge status: Home / Self Care | Payer: BC Managed Care – PPO | Source: Ambulatory Visit | Attending: Obstetrics and Gynecology | Admitting: Obstetrics and Gynecology

## 2019-09-19 ENCOUNTER — Ambulatory Visit (HOSPITAL_COMMUNITY): Payer: BC Managed Care – PPO | Admitting: *Deleted

## 2019-09-19 ENCOUNTER — Encounter (HOSPITAL_COMMUNITY): Payer: Self-pay | Admitting: *Deleted

## 2019-09-19 ENCOUNTER — Other Ambulatory Visit (HOSPITAL_COMMUNITY): Payer: Self-pay | Admitting: Obstetrics and Gynecology

## 2019-09-19 ENCOUNTER — Telehealth: Payer: Self-pay

## 2019-09-19 ENCOUNTER — Ambulatory Visit (HOSPITAL_COMMUNITY): Payer: BC Managed Care – PPO

## 2019-09-19 ENCOUNTER — Other Ambulatory Visit: Payer: Self-pay

## 2019-09-19 VITALS — BP 138/64 | HR 93 | Temp 97.2°F

## 2019-09-19 DIAGNOSIS — O24415 Gestational diabetes mellitus in pregnancy, controlled by oral hypoglycemic drugs: Secondary | ICD-10-CM | POA: Insufficient documentation

## 2019-09-19 DIAGNOSIS — O24414 Gestational diabetes mellitus in pregnancy, insulin controlled: Secondary | ICD-10-CM | POA: Insufficient documentation

## 2019-09-19 DIAGNOSIS — O289 Unspecified abnormal findings on antenatal screening of mother: Secondary | ICD-10-CM

## 2019-09-19 DIAGNOSIS — O99213 Obesity complicating pregnancy, third trimester: Secondary | ICD-10-CM | POA: Diagnosis not present

## 2019-09-19 DIAGNOSIS — Z3A34 34 weeks gestation of pregnancy: Secondary | ICD-10-CM

## 2019-09-19 NOTE — Procedures (Signed)
Maureen Lewis [birth year] [redacted]w[redacted]d  Fetus A Non-Stress Test Interpretation for 09/19/19  Indication: Unsatisfactory BPP  Fetal Heart Rate A Mode: External Baseline Rate (A): 140 bpm Variability: Moderate Accelerations: 15 x 15 Decelerations: None  Uterine Activity Mode: Toco Contraction Frequency (min): none noted  Interpretation (Fetal Testing) Nonstress Test Interpretation: Reactive Comments: FHR tracing rev'd by Dr. Donalee Citrin

## 2019-09-19 NOTE — Telephone Encounter (Signed)
Babys[facility]s reports elevated BP from pt above.  She c/o headache, and blurred vision. No recheck taken. Please advise. -EH/RMA

## 2019-09-20 ENCOUNTER — Other Ambulatory Visit (HOSPITAL_COMMUNITY): Payer: BC Managed Care – PPO

## 2019-09-20 ENCOUNTER — Other Ambulatory Visit (HOSPITAL_COMMUNITY): Payer: Self-pay | Admitting: *Deleted

## 2019-09-20 ENCOUNTER — Ambulatory Visit (HOSPITAL_COMMUNITY): Payer: BC Managed Care – PPO

## 2019-09-20 DIAGNOSIS — O24414 Gestational diabetes mellitus in pregnancy, insulin controlled: Secondary | ICD-10-CM

## 2019-09-20 NOTE — Telephone Encounter (Signed)
Pt notified of recommendations -EH/RMA  

## 2019-09-20 NOTE — Telephone Encounter (Signed)
If headache does not improve with tylenol or rest, she should present to MAU. Should be seen for BP check this week.    -KMD

## 2019-09-22 ENCOUNTER — Ambulatory Visit (INDEPENDENT_AMBULATORY_CARE_PROVIDER_SITE_OTHER): Payer: BC Managed Care – PPO | Admitting: Obstetrics and Gynecology

## 2019-09-22 ENCOUNTER — Other Ambulatory Visit: Payer: Self-pay

## 2019-09-22 ENCOUNTER — Encounter: Payer: Self-pay | Admitting: Obstetrics and Gynecology

## 2019-09-22 VITALS — BP 121/78 | HR 105 | Wt >= 6400 oz

## 2019-09-22 DIAGNOSIS — O24414 Gestational diabetes mellitus in pregnancy, insulin controlled: Secondary | ICD-10-CM

## 2019-09-22 DIAGNOSIS — O99210 Obesity complicating pregnancy, unspecified trimester: Secondary | ICD-10-CM

## 2019-09-22 DIAGNOSIS — Z3403 Encounter for supervision of normal first pregnancy, third trimester: Secondary | ICD-10-CM

## 2019-09-22 DIAGNOSIS — O99213 Obesity complicating pregnancy, third trimester: Secondary | ICD-10-CM

## 2019-09-22 DIAGNOSIS — Z3A34 34 weeks gestation of pregnancy: Secondary | ICD-10-CM

## 2019-09-22 NOTE — Progress Notes (Signed)
   PRENATAL VISIT NOTE  Subjective:  Maureen Lewis is a 23 y.o. G1P0000 at [redacted]w[redacted]d being seen today for ongoing prenatal care.  She is currently monitored for the following issues for this high-risk pregnancy and has Shoulder pain, left; Constipation; GERD (gastroesophageal reflux disease); Back pain; Pseudotumor cerebri syndrome; Obesity hypoventilation syndrome ([place]); Benign paroxysmal positional vertigo due to bilateral vestibular disorder; OSA (obstructive sleep apnea); Hypersomnia with sleep apnea; Female hirsutism; PCOS (polycystic ovarian syndrome); Vitamin D deficiency; Prediabetes; Depression; Class 3 severe obesity with serious comorbidity and body mass index (BMI) of 50.0 to 59.9 in adult [facility]); Anemia; Mass; Vertigo; Supervision of normal first pregnancy; Obesity during pregnancy, antepartum; Nausea and vomiting during pregnancy; Gestational diabetes; Alpha thalassemia trait; and Migraine headache without aura on their problem list.  Patient reports headache intermittently, improved with the flexeril.   Contractions: Irritability. Vag. Bleeding: None.  Movement: Present. Denies leaking of fluid.   The following portions of the patient's history were reviewed and updated as appropriate: allergies, current medications, past family history, past medical history, past social history, past surgical history and problem list.   Objective:   Vitals:   09/22/19 1526  BP: 121/78  Pulse: (!) 105  Weight: (!) 412 lb 14.4 oz (187.3 kg)    Fetal Status: Fetal Heart Rate (bpm): 157   Movement: Present     General:  Alert, oriented and cooperative. Patient is in no acute distress.  Skin: Skin is warm and dry. No rash noted.   Cardiovascular: Normal heart rate noted  Respiratory: Normal respiratory effort, no problems with respiration noted  Abdomen: Soft, gravid, appropriate for gestational age.  Pain/Pressure: Present     Pelvic: Cervical exam deferred        Extremities: Normal range of motion.   Edema: Trace  Mental Status: Normal mood and affect. Normal behavior. Normal judgment and thought content.   Assessment and Plan:  Pregnancy: G1P0000 at [redacted]w[redacted]d  1. Encounter for supervision of normal first pregnancy in third trimester Advised to go to hospital if headaches do not improve  2. Obesity during pregnancy, antepartum  3. Insulin controlled gestational diabetes mellitus (GDM) in third trimester NPH 14 units QHS FG: all below 95 PP: 100-115 Cont current regimen Cont weekly BPP   Preterm labor symptoms and general obstetric precautions including but not limited to vaginal bleeding, contractions, leaking of fluid and fetal movement were reviewed in detail with the patient. Please refer to After Visit Summary for other counseling recommendations.   Return in about 2 weeks (around 10/06/2019) for high OB, in person, 36 week swabs.  Future Appointments  Date Time Provider [place]  09/22/2019  4:15 PM Sloan Leiter, MD [place] None  09/27/2019  3:45 PM [name] 2 WH-MFCUS MFC-US  09/27/2019  3:50 PM [place] NURSE [place] MFC-US  10/04/2019  3:45 PM [place] NURSE [place] MFC-US  10/04/2019  3:45 PM [name] 2 WH-MFCUS MFC-US    Sloan Leiter, MD

## 2019-09-22 NOTE — Progress Notes (Signed)
Pt presents for ROB.  CBG readings available on her phone per pt. No concerns today per pt.

## 2019-09-23 ENCOUNTER — Inpatient Hospital Stay (HOSPITAL_COMMUNITY)
Admission: AD | Admit: 2019-09-23 | Discharge: 2019-09-23 | Disposition: A | Discharge status: Home / Self Care | Payer: BC Managed Care – PPO | Attending: Obstetrics & Gynecology | Admitting: Obstetrics & Gynecology

## 2019-09-23 ENCOUNTER — Encounter (HOSPITAL_COMMUNITY): Payer: Self-pay | Admitting: *Deleted

## 2019-09-23 ENCOUNTER — Other Ambulatory Visit: Payer: Self-pay

## 2019-09-23 ENCOUNTER — Other Ambulatory Visit (INDEPENDENT_AMBULATORY_CARE_PROVIDER_SITE_OTHER): Payer: BC Managed Care – PPO | Admitting: Obstetrics and Gynecology

## 2019-09-23 DIAGNOSIS — O26893 Other specified pregnancy related conditions, third trimester: Secondary | ICD-10-CM | POA: Diagnosis not present

## 2019-09-23 DIAGNOSIS — K219 Gastro-esophageal reflux disease without esophagitis: Secondary | ICD-10-CM | POA: Diagnosis not present

## 2019-09-23 DIAGNOSIS — G932 Benign intracranial hypertension: Secondary | ICD-10-CM | POA: Diagnosis not present

## 2019-09-23 DIAGNOSIS — O139 Gestational [pregnancy-induced] hypertension without significant proteinuria, unspecified trimester: Secondary | ICD-10-CM | POA: Diagnosis present

## 2019-09-23 DIAGNOSIS — O133 Gestational [pregnancy-induced] hypertension without significant proteinuria, third trimester: Secondary | ICD-10-CM | POA: Insufficient documentation

## 2019-09-23 DIAGNOSIS — E282 Polycystic ovarian syndrome: Secondary | ICD-10-CM | POA: Diagnosis not present

## 2019-09-23 DIAGNOSIS — O24415 Gestational diabetes mellitus in pregnancy, controlled by oral hypoglycemic drugs: Secondary | ICD-10-CM | POA: Diagnosis not present

## 2019-09-23 DIAGNOSIS — G4730 Sleep apnea, unspecified: Secondary | ICD-10-CM | POA: Diagnosis not present

## 2019-09-23 DIAGNOSIS — G43009 Migraine without aura, not intractable, without status migrainosus: Secondary | ICD-10-CM

## 2019-09-23 DIAGNOSIS — D563 Thalassemia minor: Secondary | ICD-10-CM | POA: Insufficient documentation

## 2019-09-23 DIAGNOSIS — O99283 Endocrine, nutritional and metabolic diseases complicating pregnancy, third trimester: Secondary | ICD-10-CM | POA: Diagnosis not present

## 2019-09-23 DIAGNOSIS — Z3A35 35 weeks gestation of pregnancy: Secondary | ICD-10-CM | POA: Insufficient documentation

## 2019-09-23 DIAGNOSIS — I10 Essential (primary) hypertension: Secondary | ICD-10-CM | POA: Diagnosis present

## 2019-09-23 DIAGNOSIS — Z7982 Long term (current) use of aspirin: Secondary | ICD-10-CM | POA: Insufficient documentation

## 2019-09-23 DIAGNOSIS — R519 Headache, unspecified: Secondary | ICD-10-CM | POA: Insufficient documentation

## 2019-09-23 DIAGNOSIS — Z79899 Other long term (current) drug therapy: Secondary | ICD-10-CM | POA: Insufficient documentation

## 2019-09-23 DIAGNOSIS — Z3403 Encounter for supervision of normal first pregnancy, third trimester: Secondary | ICD-10-CM

## 2019-09-23 DIAGNOSIS — Z7984 Long term (current) use of oral hypoglycemic drugs: Secondary | ICD-10-CM | POA: Insufficient documentation

## 2019-09-23 DIAGNOSIS — O99213 Obesity complicating pregnancy, third trimester: Secondary | ICD-10-CM | POA: Diagnosis not present

## 2019-09-23 DIAGNOSIS — G43909 Migraine, unspecified, not intractable, without status migrainosus: Secondary | ICD-10-CM | POA: Diagnosis present

## 2019-09-23 HISTORY — DX: Gestational (pregnancy-induced) hypertension without significant proteinuria, unspecified trimester: O13.9

## 2019-09-23 LAB — URINALYSIS, ROUTINE W REFLEX MICROSCOPIC
Bilirubin Urine: NEGATIVE
Glucose, UA: NEGATIVE mg/dL
Hgb urine dipstick: NEGATIVE
Ketones, ur: NEGATIVE mg/dL
Leukocytes,Ua: NEGATIVE
Nitrite: NEGATIVE
Protein, ur: NEGATIVE mg/dL
Specific Gravity, Urine: 1.014 (ref 1.005–1.030)
pH: 7 (ref 5.0–8.0)

## 2019-09-23 LAB — COMPREHENSIVE METABOLIC PANEL
ALT: 14 U/L (ref 0–44)
AST: 14 U/L — ABNORMAL LOW (ref 15–41)
Albumin: 2.6 g/dL — ABNORMAL LOW (ref 3.5–5.0)
Alkaline Phosphatase: 105 U/L (ref 38–126)
Anion gap: 9 (ref 5–15)
BUN: <5 mg/dL — ABNORMAL LOW (ref 6–20)
CO2: 20 mmol/L — ABNORMAL LOW (ref 22–32)
Calcium: 8.8 mg/dL — ABNORMAL LOW (ref 8.9–10.3)
Chloride: 106 mmol/L (ref 98–111)
Creatinine, Ser: 0.46 mg/dL (ref 0.44–1.00)
GFR calc Af Amer: >60 mL/min (ref >60)
GFR calc non Af Amer: >60 mL/min (ref >60)
Glucose, Bld: 85 mg/dL (ref 70–99)
Potassium: 3.9 mmol/L (ref 3.5–5.1)
Sodium: 135 mmol/L (ref 135–145)
Total Bilirubin: 0.7 mg/dL (ref 0.3–1.2)
Total Protein: 6.3 g/dL — ABNORMAL LOW (ref 6.5–8.1)

## 2019-09-23 LAB — PROTEIN / CREATININE RATIO, URINE
Creatinine, Urine: 121.03 mg/dL
Protein Creatinine Ratio: 0.1 mg/mg{Cre} (ref 0.00–0.15)
Total Protein, Urine: 12 mg/dL

## 2019-09-23 LAB — CBC
HCT: 36.2 % (ref 36.0–46.0)
Hemoglobin: 10.6 g/dL — ABNORMAL LOW (ref 12.0–15.0)
MCH: 21.6 pg — ABNORMAL LOW (ref 26.0–34.0)
MCHC: 29.3 g/dL — ABNORMAL LOW (ref 30.0–36.0)
MCV: 73.9 fL — ABNORMAL LOW (ref 80.0–100.0)
Platelets: 184 10*3/uL (ref 150–400)
RBC: 4.9 MIL/uL (ref 3.87–5.11)
RDW: 15.9 % — ABNORMAL HIGH (ref 11.5–15.5)
WBC: 6.2 10*3/uL (ref 4.0–10.5)
nRBC: 0 % (ref 0.0–0.2)

## 2019-09-23 MED ORDER — DIPHENHYDRAMINE HCL 50 MG/ML IJ SOLN
25.0000 mg | Freq: Once | INTRAMUSCULAR | Status: AC
  Administered 2019-09-23: 25 mg via INTRAVENOUS
  Filled 2019-09-23: qty 1

## 2019-09-23 MED ORDER — CYCLOBENZAPRINE HCL 10 MG PO TABS: 10.0000 mg | ORAL_TABLET | Freq: Two times a day (BID) | ORAL | 0 refills | Status: AC | PRN

## 2019-09-23 MED ORDER — LACTATED RINGERS IV BOLUS
1000.0000 mL | Freq: Once | INTRAVENOUS | Status: AC
  Administered 2019-09-23: 1000 mL via INTRAVENOUS

## 2019-09-23 MED ORDER — DEXAMETHASONE SODIUM PHOSPHATE 10 MG/ML IJ SOLN
10.0000 mg | Freq: Once | INTRAMUSCULAR | Status: AC
  Administered 2019-09-23: 10 mg via INTRAVENOUS
  Filled 2019-09-23: qty 1

## 2019-09-23 MED ORDER — METOCLOPRAMIDE HCL 5 MG/ML IJ SOLN
10.0000 mg | Freq: Once | INTRAMUSCULAR | Status: AC
  Administered 2019-09-23: 10 mg via INTRAVENOUS
  Filled 2019-09-23: qty 2

## 2019-09-23 NOTE — MAU Provider Note (Addendum)
History     CSN: [id number]  Arrival date and time: 09/23/19 0854   None     Chief Complaint  Patient presents with  . Shortness of Breath  . Headache  . Blurred Vision   HPI: [name] is a 23 year old G1P0000 woman at 27w0dgestation with history of GDM, pseudotumor cerebri, morbid obesity, sleep apnea, headaches who presents to the MAU for headaches, blurry vision, and SOB. She states that she has had bad headaches throughout her pregnancy, but they worsened 1-2 weeks ago. Pt describes the headaches as 7-8 / 10 in pain, constant, and lasting hours. She endorses noise and light sensitivity, and watering of her eyes. Patient states that she has taken Tylenol as needed without improvement. Flexeril has helped with her sx because it "knocks me out." Patient also reports that she has tried to sleep and rest with no improvement. She also endorses blurry vision that accompanies headaches. States that she "sees stars" and feels lightheaded, occurring 2-3 times a day.   Patient states that she previously experienced headaches prior to pregnancy about 3 years ago when she was diagnosed with vertigo. At the time, she also was told that she had idiopathic intracranial hypertension. States that she underwent spinal tap that was "unsuccessful" and had to be discontinued early in the procedure due to inability to tolerate pain. She denies history of migraine headaches.   Patient also endorses chest pain and shortness of breath that started 1-2 weeks ago. Reports that she has had SOB throughout pregnancy, but this "feels different" and is significantly worse. She states that her CP and SOB come and go. Denies use of CPAP machine for sleep apnea -- discontinued use 1 year ago.  She denies nausea, vomiting. Had an episode of diarrhea last week with continued loose stools.    Past Medical History:  Diagnosis Date  . Abnormal uterine bleeding   . Acid reflux   . Alpha thalassemia trait   .  Amenorrhea   . Anemia    no current med.  . Complication of anesthesia    states had to keep giving her anesthesia during EGD  . Constipation   . Depression    "I'm good"  . Diabetes mellitus without complication ([facility]    "prediabetes"  . Finger mass, left 03/2017   middle finger  . Gestational diabetes   . Headache   . Infection    UTI  . Irritable bowel syndrome (IBS)   . Morbid obesity with body mass index (BMI) of 45.0 to 49.9 in adult ([facility]   . Plantar fasciitis, bilateral   . Polycystic ovary syndrome   . Shortness of breath   . Sleep apnea    no CPAP use    Past Surgical History:  Procedure Laterality Date  . COLONOSCOPY WITH PROPOFOL  10/07/2016  . ESOPHAGOGASTRODUODENOSCOPY  12/21/2015  . EXCISION MASS UPPER EXTREMETIES Left 04/21/2017   Procedure: EXCISION MASS LEFT MIDDLE FINGER;  Surgeon: KDaryll Brod MD;  [facility]  Service: Orthopedics;  Laterality: Left;    Family History  Problem Relation Age of Onset  . Diabetes Maternal Aunt   . Hypertension Maternal Uncle   . Depression Maternal Grandfather   . Diabetes Maternal Grandfather   . Hypertension Maternal Grandfather   . Arthritis Mother   . High blood pressure Mother   . Depression Mother   . Sleep apnea Mother   . Obesity Mother     Social History  Tobacco Use  . Smoking status: Never Smoker  . Smokeless tobacco: Never Used  Substance Use Topics  . Alcohol use: Not Currently    Comment: social   . Drug use: No    Allergies:  Allergies  Allergen Reactions  . Bee Pollen Hives, Shortness Of Breath and Swelling  . Bee Venom Hives, Shortness Of Breath and Swelling  . Hydrocodone-Acetaminophen Anaphylaxis    No problem when she takes Tylenol  . Peach Flavor Hives, Shortness Of Breath and Other (See Comments)    SWELLING OF MOUTH  . Pollen Extract Hives, Shortness Of Breath and Swelling    Facility-Administered Medications Prior to Admission  Medication Dose  Route Frequency Provider Last Rate Last Dose  . insulin NPH Human (NOVOLIN N) injection 10 Units  10 Units Subcutaneous QHS Sloan Leiter, MD       Medications Prior to Admission  Medication Sig Dispense Refill Last Dose  . acetaminophen (TYLENOL) 325 MG tablet Take 650 mg by mouth every 6 (six) hours as needed.   09/22/2019 at 1600  . aspirin EC 81 MG tablet Take 1 tablet (81 mg total) by mouth daily. Take after 12 weeks for prevention of preeclampsia later in pregnancy-do not start until 04/17/19 300 tablet 0 09/22/2019 at 0930  . cyclobenzaprine (FLEXERIL) 10 MG tablet Take 1 tablet (10 mg total) by mouth 2 (two) times daily as needed for muscle spasms. 20 tablet 0 09/22/2019 at 2030  . insulin NPH Human (NOVOLIN N) 100 UNIT/ML injection Inject 0.14 mLs (14 Units total) into the skin at bedtime. 10 mL 3 09/22/2019 at 2030  . loratadine (CLARITIN) 10 MG tablet Take 10 mg by mouth daily as needed for allergies.    Past Month at Unknown time  . metFORMIN (GLUCOPHAGE XR) 500 MG 24 hr tablet Take 2 tablets (1,000 mg total) by mouth 2 (two) times daily after a meal. Take after breakfast and after supper. 60 tablet 5 09/22/2019 at 2030  . ondansetron (ZOFRAN ODT) 4 MG disintegrating tablet Take 1 tablet (4 mg total) by mouth every 8 (eight) hours as needed for nausea or vomiting. 30 tablet 1 Past Month at Unknown time  . Prenatal Vit-Fe Fumarate-FA (PRENATAL VITAMINS PO) Take by mouth.   09/22/2019 at 0700  . Accu-Chek FastClix Lancets MISC 1 Units by Percutaneous route 4 (four) times daily. 100 each 12   . Blood Glucose Monitoring Suppl (ACCU-CHEK GUIDE) w/Device KIT 1 Device by Does not apply route 4 (four) times daily. 1 kit 0   . Blood Pressure Monitoring KIT 1 kit by Does not apply route once a week. 1 kit 0   . Elastic Bandages & Supports (COMFORT FIT MATERNITY SUPP SM) MISC Wear as directed. 1 each 0   . glucose blood (ACCU-CHEK GUIDE) test strip Use to check blood sugars four times a day was  instructed 50 each 12     Review of Systems  Constitutional: Negative for fever.  HENT: Negative for hearing loss and rhinorrhea.   Eyes: Positive for photophobia. Negative for pain.  Respiratory: Positive for chest tightness and shortness of breath.   Cardiovascular: Positive for chest pain. Negative for palpitations and leg swelling.  Gastrointestinal: Negative for constipation, diarrhea, nausea and vomiting.  Genitourinary: Negative for difficulty urinating, dysuria and vaginal bleeding.  Neurological: Positive for dizziness and headaches. Negative for syncope.   Physical Exam   Patient Vitals for the past 24 hrs:  BP Temp Temp src Pulse Resp SpO2 Height Weight  09/23/19 1236 133/72 98.4 F (36.9 C) Oral 96 20 98 % - -  09/23/19 1015 (!) 141/72 - - 90 - 99 % - -  09/23/19 1000 (!) 142/67 - - 90 - 99 % - -  09/23/19 0945 (!) 156/87 - - 97 - - - -  09/23/19 0940 (!) 152/82 - - 95 - - - -  09/23/19 0916 (!) 146/72 98.3 F (36.8 C) Oral 94 20 99 % - -  09/23/19 7062 - - - - - - _0  (1.88 m) (!) 186.1 kg     Physical Exam  Constitutional: She is oriented to person, place, and time. She appears well-developed and well-nourished. No distress.  HENT:  Head: Normocephalic and atraumatic.  Eyes: Pupils are equal, round, and reactive to light. Conjunctivae and EOM are normal. Right eye exhibits no discharge. Left eye exhibits no discharge.  Neck: Normal range of motion. Neck supple.  Cardiovascular: Normal rate, regular rhythm, normal heart sounds and intact distal pulses. Exam reveals no gallop and no friction rub.  No murmur heard. Respiratory: Effort normal and breath sounds normal. No respiratory distress. She has no wheezes. She has no rales. She exhibits no tenderness.  GI: Soft. Bowel sounds are normal. She exhibits no distension. There is no abdominal tenderness. There is no rebound and no guarding.  Neurological: She is alert and oriented to person, place, and time. No  cranial nerve deficit.  Skin: She is not diaphoretic.  Psychiatric: She has a normal mood and affect. Her behavior is normal. Thought content normal.    MAU Course  Procedures  MDM: Maureen Lewis is a 23 yo G1P0000 who presents with persistent HA, dyspnea, and vision changes in the setting of elevated BPs suggesting gestational HTN. Worsening headaches and blurry vision with associated noise/light sensitivity that come and go raise concern for migraine headaches. Preeclampsia was excluded via labs. Pseudotumor cerebri should also be considered given her history, symptoms of headache and visual changes. Intermittent nature of chest pain and dyspnea are reassuring and suggest likely dyspnea related to monitor obesity. Also considering CP and dyspnea secondary to anxiety. PE is less likely given nl pulse, O2 sat, intermittent nature of sx, and no signs of DVT on exam.  - Labs: CBC, CMP, UA, Protein/Cr ratio - Repeat BP q72mns - HA regimen: IVF bolus, Decadron 147m Benadryl 2588mReglan 94m77mssessment and Plan  - Return Sunday for BP recheck and repeat blood work - Plan for IOL _1  weeks given gestational HTN - Continue Flexeril  NakiCristela Felt4/2020, 10:14 AM   I confirm that I have verified the information documented in the medical student's note and that I have also personally reperformed the history, physical exam and all medical decision making activities of this service and have verified that all service and findings are accurately documented in this student's note.   Headache resolved with H/A protocol  IOL scheduled for 10/07/2019 @ midnight  Information provided on IOL and balloon catheter placement for cervical ripening    DawsLaury DeepM 09/23/2019 12:53 PM

## 2019-09-23 NOTE — Progress Notes (Signed)
IOL orders entered in Epic.  Laury Deep, CNM

## 2019-09-23 NOTE — Discharge Instructions (Signed)
You need to return to Maternity assessment Unit on Sunday for repeat blood work and blood pressure check.  YOU ARE SCHEDULED FOR INDUCTION OF LABOR ON 10/07/2019 AT 12:00 AM. YOU WILL NEED TO ARRIVE AT LABOR AND DELIVERY ON 10/06/2019 AT 11:45 PM.

## 2019-09-23 NOTE — MAU Note (Signed)
Presents with c/o SOB, blurred vision, and H/A.  Reports taking Tylenol but H/A unrelieved, instructed to be seen if H/A persists.Denies VB or LOF.  Reports +FM.

## 2019-09-25 ENCOUNTER — Inpatient Hospital Stay (HOSPITAL_COMMUNITY)
Admission: AD | Admit: 2019-09-25 | Discharge: 2019-09-25 | Disposition: A | Discharge status: Home / Self Care | Payer: BC Managed Care – PPO | Attending: Obstetrics & Gynecology | Admitting: Obstetrics & Gynecology

## 2019-09-25 ENCOUNTER — Encounter (HOSPITAL_COMMUNITY): Payer: Self-pay

## 2019-09-25 ENCOUNTER — Other Ambulatory Visit: Payer: Self-pay

## 2019-09-25 DIAGNOSIS — R519 Headache, unspecified: Secondary | ICD-10-CM | POA: Insufficient documentation

## 2019-09-25 DIAGNOSIS — O24113 Pre-existing diabetes mellitus, type 2, in pregnancy, third trimester: Secondary | ICD-10-CM | POA: Insufficient documentation

## 2019-09-25 DIAGNOSIS — Z79899 Other long term (current) drug therapy: Secondary | ICD-10-CM | POA: Diagnosis not present

## 2019-09-25 DIAGNOSIS — O99213 Obesity complicating pregnancy, third trimester: Secondary | ICD-10-CM | POA: Diagnosis not present

## 2019-09-25 DIAGNOSIS — Z7984 Long term (current) use of oral hypoglycemic drugs: Secondary | ICD-10-CM | POA: Diagnosis not present

## 2019-09-25 DIAGNOSIS — O133 Gestational [pregnancy-induced] hypertension without significant proteinuria, third trimester: Secondary | ICD-10-CM | POA: Insufficient documentation

## 2019-09-25 DIAGNOSIS — D563 Thalassemia minor: Secondary | ICD-10-CM | POA: Insufficient documentation

## 2019-09-25 DIAGNOSIS — Z3A35 35 weeks gestation of pregnancy: Secondary | ICD-10-CM | POA: Insufficient documentation

## 2019-09-25 DIAGNOSIS — Z3689 Encounter for other specified antenatal screening: Secondary | ICD-10-CM | POA: Insufficient documentation

## 2019-09-25 DIAGNOSIS — E119 Type 2 diabetes mellitus without complications: Secondary | ICD-10-CM | POA: Diagnosis not present

## 2019-09-25 DIAGNOSIS — Z7982 Long term (current) use of aspirin: Secondary | ICD-10-CM | POA: Diagnosis not present

## 2019-09-25 LAB — URINALYSIS, ROUTINE W REFLEX MICROSCOPIC
Bilirubin Urine: NEGATIVE
Glucose, UA: NEGATIVE mg/dL
Hgb urine dipstick: NEGATIVE
Ketones, ur: NEGATIVE mg/dL
Leukocytes,Ua: NEGATIVE
Nitrite: NEGATIVE
Protein, ur: NEGATIVE mg/dL
Specific Gravity, Urine: 1.011 (ref 1.005–1.030)
pH: 7 (ref 5.0–8.0)

## 2019-09-25 NOTE — Discharge Instructions (Signed)
Hypertension During Pregnancy °Hypertension is also called high blood pressure. High blood pressure means that the force of your blood moving in your body is too strong. It can cause problems for you and your baby. Different types of high blood pressure can happen during pregnancy. The types are: °· High blood pressure before you got pregnant. This is called chronic hypertension.  This can continue during your pregnancy. Your doctor will want to keep checking your blood pressure. You may need medicine to keep your blood pressure under control while you are pregnant. You will need follow-up visits after you have your baby. °· High blood pressure that goes up during pregnancy when it was normal before. This is called gestational hypertension. It will usually get better after you have your baby, but your doctor will need to watch your blood pressure to make sure that it is getting better. °· Very high blood pressure during pregnancy. This is called preeclampsia. Very high blood pressure is an emergency that needs to be checked and treated right away. °· You may develop very high blood pressure after giving birth. This is called postpartum preeclampsia. This usually occurs within 48 hours after childbirth but may occur up to 6 weeks after giving birth. This is rare. °How does this affect me? °If you have high blood pressure during pregnancy, you have a higher chance of developing high blood pressure: °· As you get older. °· If you get pregnant again. °In some cases, high blood pressure during pregnancy can cause: °· Stroke. °· Heart attack. °· Damage to the kidneys, lungs, or liver. °· Preeclampsia. °· Jerky movements you cannot control (convulsions or seizures). °· Problems with the placenta. °How does this affect my baby? °Your baby may: °· Be born early. °· Not weigh as much as he or she should. °· Not handle labor well, leading to a c-section birth. °What are the risks? °· Having high blood pressure during a past  pregnancy. °· Being overweight. °· Being 35 years old or older. °· Being pregnant for the first time. °· Being pregnant with more than one baby. °· Becoming pregnant using fertility methods, such as IVF. °· Having other problems, such as diabetes, or kidney disease. °· Having family members who have high blood pressure. °What can I do to lower my risk? ° °· Keep a healthy weight. °· Eat a healthy diet. °· Follow what your doctor tells you about treating any medical problems that you had before becoming pregnant. °It is very important to go to all of your doctor visits. Your doctor will check your blood pressure and make sure that your pregnancy is progressing as it should. Treatment should start early if a problem is found. °How is this treated? °Treatment for high blood pressure during pregnancy can differ depending on the type of high blood pressure you have and how serious it is. °· You may need to take blood pressure medicine. °· If you have been taking medicine for your blood pressure, you may need to change the medicine during pregnancy if it is not safe for your baby. °· If your doctor thinks that you could get very high blood pressure, he or she may tell you to take a low-dose aspirin during your pregnancy. °· If you have very high blood pressure, you may need to stay in the hospital so you and your baby can be watched closely. You may also need to take medicine to lower your blood pressure. This medicine may be given by mouth   or through an IV tube. °· In some cases, if your condition gets worse, you may need to have your baby early. °Follow these instructions at home: °Eating and drinking ° °· Drink enough fluid to keep your pee (urine) pale yellow. °· Avoid caffeine. °Lifestyle °· Do not use any products that contain nicotine or tobacco, such as cigarettes, e-cigarettes, and chewing tobacco. If you need help quitting, ask your doctor. °· Do not use alcohol or drugs. °· Avoid stress. °· Rest and get plenty  of sleep. °· Regular exercise can help. Ask your doctor what kinds of exercise are best for you. °General instructions °· Take over-the-counter and prescription medicines only as told by your doctor. °· Keep all prenatal and follow-up visits as told by your doctor. This is important. °Contact a doctor if: °· You have symptoms that your doctor told you to watch for, such as: °? Headaches. °? Nausea. °? Vomiting. °? Belly (abdominal) pain. °? Dizziness. °? Light-headedness. °Get help right away if: °· You have: °? Very bad belly pain that does not get better with treatment. °? A very bad headache that does not get better. °? Vomiting that does not get better. °? Sudden, fast weight gain. °? Sudden swelling in your hands, ankles, or face. °? Bleeding from your vagina. °? Blood in your pee. °? Blurry vision. °? Double vision. °? Shortness of breath. °? Chest pain. °? Weakness on one side of your body. °? Trouble talking. °· Your baby is not moving as much as usual. °Summary °· High blood pressure is also called hypertension. °· High blood pressure means that the force of your blood moving in your body is too strong. °· High blood pressure can cause problems for you and your baby. °· Keep all follow-up visits as told by your doctor. This is important. °This information is not intended to replace advice given to you by your health care provider. Make sure you discuss any questions you have with your health care provider. °Document Released: 11/08/2010 Document Revised: 01/27/2019 Document Reviewed: 11/02/2018 °Elsevier Patient Education © 2020 [employer] ° °

## 2019-09-25 NOTE — MAU Provider Note (Signed)
History     CSN: [id number]  Arrival date and time: 09/25/19 1015  Chief Complaint  Patient presents with  . Labs Only   23 y.o. G1 '@35'$ .2 weeks presenting for BP check after recent dx of gHTN 2 days ago. Denies visual disturbances, epigastric pain, CP, and SOB. Reports frontal HA and describes as "same headache I've had" throughout the pregnancy. Rates 6/10. Reports HA usually responds to Flexeril. She did not take any today because she overslept and was supposed to be here. Reports good FM. Denies VB, LOF, and ctx.   OB History    Gravida  1   Para  0   Term  0   Preterm  0   AB  0   Living  0     SAB  0   TAB  0   Ectopic  0   Multiple  0   Live Births  0           Past Medical History:  Diagnosis Date  . Abnormal uterine bleeding   . Acid reflux   . Alpha thalassemia trait   . Amenorrhea   . Anemia    no current med.  . Complication of anesthesia    states had to keep giving her anesthesia during EGD  . Constipation   . Depression    "I'm good"  . Diabetes mellitus without complication ([facility])    "prediabetes"  . Finger mass, left 03/2017   middle finger  . Gestational diabetes   . Headache   . Infection    UTI  . Irritable bowel syndrome (IBS)   . Morbid obesity with body mass index (BMI) of 45.0 to 49.9 in adult [facility])   . Plantar fasciitis, bilateral   . Polycystic ovary syndrome   . Shortness of breath   . Sleep apnea    no CPAP use    Past Surgical History:  Procedure Laterality Date  . COLONOSCOPY WITH PROPOFOL  10/07/2016  . ESOPHAGOGASTRODUODENOSCOPY  12/21/2015  . EXCISION MASS UPPER EXTREMETIES Left 04/21/2017   Procedure: EXCISION MASS LEFT MIDDLE FINGER;  Surgeon: Daryll Brod, MD;  Location: [place];  Service: Orthopedics;  Laterality: Left;    Family History  Problem Relation Age of Onset  . Diabetes Maternal Aunt   . Hypertension Maternal Uncle   . Depression Maternal Grandfather   . Diabetes Maternal  Grandfather   . Hypertension Maternal Grandfather   . Arthritis Mother   . High blood pressure Mother   . Depression Mother   . Sleep apnea Mother   . Obesity Mother     Social History   Tobacco Use  . Smoking status: Never Smoker  . Smokeless tobacco: Never Used  Substance Use Topics  . Alcohol use: Not Currently    Comment: social   . Drug use: No    Allergies:  Allergies  Allergen Reactions  . Bee Pollen Hives, Shortness Of Breath and Swelling  . Bee Venom Hives, Shortness Of Breath and Swelling  . Hydrocodone-Acetaminophen Anaphylaxis    No problem when she takes Tylenol  . Peach Flavor Hives, Shortness Of Breath and Other (See Comments)    SWELLING OF MOUTH  . Pollen Extract Hives, Shortness Of Breath and Swelling    No medications prior to admission.   Review of Systems  Eyes: Negative for visual disturbance.  Respiratory: Negative for shortness of breath.   Cardiovascular: Negative for chest pain.  Gastrointestinal: Negative for abdominal pain.  Genitourinary: Negative for vaginal bleeding.  Neurological: Negative for headaches.   Physical Exam   Blood pressure (!) 149/84, pulse 95, temperature 98.1 F (36.7 C), temperature source Oral, resp. rate 20, height '6\' 2"'$  (1.88 m), weight (!) 185.5 kg, last menstrual period 01/21/2019, SpO2 98 %, unknown if currently breastfeeding. Patient Vitals for the past 24 hrs:  BP Temp Temp src Pulse Resp SpO2 Height Weight  09/25/19 1101 (!) 149/84 - - 95 - - - -  09/25/19 1038 135/71 98.1 F (36.7 C) Oral (!) 106 20 98 % '6\' 2"'$  (1.88 m) (!) 185.5 kg   Physical Exam  Nursing note and vitals reviewed. Constitutional: She is oriented to person, place, and time. She appears well-developed.  HENT:  Head: Normocephalic and atraumatic.  Neck: Normal range of motion.  Cardiovascular: Normal rate.  Respiratory: Effort normal.  Musculoskeletal: Normal range of motion.  Neurological: She is alert and oriented to person, place,  and time.  Skin: Skin is warm and dry.  Psychiatric: She has a normal mood and affect.  EFM: 150 bpm, mod variability, + accels, no decels Toco: none  MAU Course  Procedures  MDM Chart review: pregnancy is complicated by gHTN, E3XVQ on Insulin, morbid obesity, & alpha thal trait. No severe BPs today. Offered Flexeril but she drove herself and prefers to take at home. Consult with Dr. Harolyn Rutherford, plan for continued outpt management and IOL at 37 wks. Instructed pt to return for severe features including HA that doesn't respond to meds. Stable for discharge home.  Assessment and Plan   1. [redacted] weeks gestation of pregnancy   2. NST (non-stress test) reactive   3. Gestational hypertension, third trimester    Discharge home Follow up at [facility] as scheduled Strict PEC precautions FMCs  Allergies as of 09/25/2019      Reactions   Bee Pollen Hives, Shortness Of Breath, Swelling   Bee Venom Hives, Shortness Of Breath, Swelling   Hydrocodone-acetaminophen Anaphylaxis   No problem when she takes Tylenol   Peach Flavor Hives, Shortness Of Breath, Other (See Comments)   SWELLING OF MOUTH   Pollen Extract Hives, Shortness Of Breath, Swelling      Medication List    TAKE these medications   Accu-Chek FastClix Lancets Misc 1 Units by Percutaneous route 4 (four) times daily.   Accu-Chek Guide test strip Generic drug: glucose blood Use to check blood sugars four times a day was instructed   Accu-Chek Guide w/Device Kit 1 Device by Does not apply route 4 (four) times daily.   acetaminophen 325 MG tablet Commonly known as: TYLENOL Take 650 mg by mouth every 6 (six) hours as needed.   aspirin EC 81 MG tablet Take 1 tablet (81 mg total) by mouth daily. Take after 12 weeks for prevention of preeclampsia later in pregnancy-do not start until 04/17/19   Blood Pressure Monitoring Kit 1 kit by Does not apply route once a week.   [place] Supp Sm Misc Wear as directed.    cyclobenzaprine 10 MG tablet Commonly known as: FLEXERIL Take 1 tablet (10 mg total) by mouth 2 (two) times daily as needed for muscle spasms.   insulin NPH Human 100 UNIT/ML injection Commonly known as: NOVOLIN N Inject 0.14 mLs (14 Units total) into the skin at bedtime.   loratadine 10 MG tablet Commonly known as: CLARITIN Take 10 mg by mouth daily as needed for allergies.   metFORMIN 500 MG 24 hr tablet Commonly known as: Glucophage  XR Take 2 tablets (1,000 mg total) by mouth 2 (two) times daily after a meal. Take after breakfast and after supper.   ondansetron 4 MG disintegrating tablet Commonly known as: Zofran ODT Take 1 tablet (4 mg total) by mouth every 8 (eight) hours as needed for nausea or vomiting.   PRENATAL VITAMINS PO Take by mouth.      Julianne Handler, CNM 09/25/2019, 12:03 PM

## 2019-09-25 NOTE — MAU Note (Signed)
Pt reports to mau for follow up NST and PIH labs.  Pt denies any LOF or vag bleeding at this time.  Pt denies any abd pain and reports good fetal movement.  Pt reports having a "slight headache" but reports the headache is no worse than when she was last seen in office.  Pt denies any visual changes or upper quad pain.

## 2019-09-26 ENCOUNTER — Encounter (HOSPITAL_COMMUNITY): Payer: Self-pay

## 2019-09-26 ENCOUNTER — Telehealth: Payer: Self-pay

## 2019-09-26 ENCOUNTER — Inpatient Hospital Stay (HOSPITAL_COMMUNITY)
Admission: AD | Admit: 2019-09-26 | Discharge: 2019-09-26 | Disposition: A | Discharge status: Home / Self Care | Payer: BC Managed Care – PPO | Attending: Obstetrics and Gynecology | Admitting: Obstetrics and Gynecology

## 2019-09-26 ENCOUNTER — Inpatient Hospital Stay (HOSPITAL_COMMUNITY): Admit: 2019-09-26 | Payer: BC Managed Care – PPO

## 2019-09-26 ENCOUNTER — Other Ambulatory Visit: Payer: Self-pay

## 2019-09-26 DIAGNOSIS — Z3A35 35 weeks gestation of pregnancy: Secondary | ICD-10-CM

## 2019-09-26 DIAGNOSIS — O24113 Pre-existing diabetes mellitus, type 2, in pregnancy, third trimester: Secondary | ICD-10-CM | POA: Diagnosis not present

## 2019-09-26 DIAGNOSIS — O133 Gestational [pregnancy-induced] hypertension without significant proteinuria, third trimester: Secondary | ICD-10-CM | POA: Diagnosis not present

## 2019-09-26 DIAGNOSIS — G43909 Migraine, unspecified, not intractable, without status migrainosus: Secondary | ICD-10-CM | POA: Diagnosis present

## 2019-09-26 DIAGNOSIS — O99353 Diseases of the nervous system complicating pregnancy, third trimester: Secondary | ICD-10-CM | POA: Diagnosis not present

## 2019-09-26 DIAGNOSIS — G43009 Migraine without aura, not intractable, without status migrainosus: Secondary | ICD-10-CM | POA: Diagnosis not present

## 2019-09-26 DIAGNOSIS — D563 Thalassemia minor: Secondary | ICD-10-CM | POA: Diagnosis not present

## 2019-09-26 DIAGNOSIS — Z3403 Encounter for supervision of normal first pregnancy, third trimester: Secondary | ICD-10-CM

## 2019-09-26 DIAGNOSIS — O99213 Obesity complicating pregnancy, third trimester: Secondary | ICD-10-CM | POA: Insufficient documentation

## 2019-09-26 DIAGNOSIS — O139 Gestational [pregnancy-induced] hypertension without significant proteinuria, unspecified trimester: Secondary | ICD-10-CM | POA: Diagnosis present

## 2019-09-26 DIAGNOSIS — E119 Type 2 diabetes mellitus without complications: Secondary | ICD-10-CM | POA: Insufficient documentation

## 2019-09-26 DIAGNOSIS — O26893 Other specified pregnancy related conditions, third trimester: Secondary | ICD-10-CM | POA: Diagnosis not present

## 2019-09-26 DIAGNOSIS — I10 Essential (primary) hypertension: Secondary | ICD-10-CM | POA: Diagnosis present

## 2019-09-26 LAB — PROTEIN / CREATININE RATIO, URINE
Creatinine, Urine: 167.8 mg/dL
Protein Creatinine Ratio: 0.09 mg/mg{Cre} (ref 0.00–0.15)
Total Protein, Urine: 15 mg/dL

## 2019-09-26 LAB — COMPREHENSIVE METABOLIC PANEL
ALT: 16 U/L (ref 0–44)
AST: 16 U/L (ref 15–41)
Albumin: 2.6 g/dL — ABNORMAL LOW (ref 3.5–5.0)
Alkaline Phosphatase: 107 U/L (ref 38–126)
Anion gap: 8 (ref 5–15)
BUN: <5 mg/dL — ABNORMAL LOW (ref 6–20)
CO2: 21 mmol/L — ABNORMAL LOW (ref 22–32)
Calcium: 9.2 mg/dL (ref 8.9–10.3)
Chloride: 108 mmol/L (ref 98–111)
Creatinine, Ser: 0.44 mg/dL (ref 0.44–1.00)
GFR calc Af Amer: >60 mL/min (ref >60)
GFR calc non Af Amer: >60 mL/min (ref >60)
Glucose, Bld: 82 mg/dL (ref 70–99)
Potassium: 4.1 mmol/L (ref 3.5–5.1)
Sodium: 137 mmol/L (ref 135–145)
Total Bilirubin: 0.1 mg/dL — ABNORMAL LOW (ref 0.3–1.2)
Total Protein: 6.4 g/dL — ABNORMAL LOW (ref 6.5–8.1)

## 2019-09-26 LAB — GLUCOSE, CAPILLARY
Glucose-Capillary: 93 mg/dL (ref 70–99)
Glucose-Capillary: 110 mg/dL — ABNORMAL HIGH (ref 70–99)

## 2019-09-26 LAB — URINALYSIS, ROUTINE W REFLEX MICROSCOPIC
Bilirubin Urine: NEGATIVE
Glucose, UA: NEGATIVE mg/dL
Hgb urine dipstick: NEGATIVE
Ketones, ur: NEGATIVE mg/dL
Leukocytes,Ua: NEGATIVE
Nitrite: NEGATIVE
Protein, ur: NEGATIVE mg/dL
Specific Gravity, Urine: 1.021 (ref 1.005–1.030)
pH: 6 (ref 5.0–8.0)

## 2019-09-26 LAB — CBC
HCT: 34.9 % — ABNORMAL LOW (ref 36.0–46.0)
Hemoglobin: 10.8 g/dL — ABNORMAL LOW (ref 12.0–15.0)
MCH: 21.5 pg — ABNORMAL LOW (ref 26.0–34.0)
MCHC: 30.9 g/dL (ref 30.0–36.0)
MCV: 69.5 fL — ABNORMAL LOW (ref 80.0–100.0)
Platelets: 261 10*3/uL (ref 150–400)
RBC: 5.02 MIL/uL (ref 3.87–5.11)
RDW: 15.7 % — ABNORMAL HIGH (ref 11.5–15.5)
WBC: 7.1 10*3/uL (ref 4.0–10.5)
nRBC: 0 % (ref 0.0–0.2)

## 2019-09-26 LAB — OB RESULTS CONSOLE GBS: GBS: NEGATIVE

## 2019-09-26 MED ORDER — METOCLOPRAMIDE HCL 5 MG/ML IJ SOLN
10.0000 mg | Freq: Once | INTRAMUSCULAR | Status: AC
  Administered 2019-09-26: 10 mg via INTRAVENOUS
  Filled 2019-09-26: qty 2

## 2019-09-26 MED ORDER — DIPHENHYDRAMINE HCL 50 MG/ML IJ SOLN
25.0000 mg | Freq: Once | INTRAMUSCULAR | Status: AC
  Administered 2019-09-26: 25 mg via INTRAVENOUS
  Filled 2019-09-26: qty 1

## 2019-09-26 MED ORDER — DEXAMETHASONE SODIUM PHOSPHATE 10 MG/ML IJ SOLN
10.0000 mg | Freq: Once | INTRAMUSCULAR | Status: AC
  Administered 2019-09-26: 10 mg via INTRAVENOUS
  Filled 2019-09-26: qty 1

## 2019-09-26 MED ORDER — LACTATED RINGERS IV BOLUS
1000.0000 mL | Freq: Once | INTRAVENOUS | Status: AC
  Administered 2019-09-26: 1000 mL via INTRAVENOUS

## 2019-09-26 MED ORDER — METFORMIN HCL 500 MG PO TABS
1000.0000 mg | ORAL_TABLET | Freq: Two times a day (BID) | ORAL | Status: DC
  Administered 2019-09-26: 1000 mg via ORAL
  Filled 2019-09-26: qty 2

## 2019-09-26 NOTE — MAU Provider Note (Signed)
History     CSN: [id number]  Arrival date and time: 09/26/19 1150   First Provider Initiated Contact with Patient 09/26/19 1336      Chief Complaint  Patient presents with  . Hypertension   HPI  Ms.  Maureen Lewis is a 23 y.o. year old G46P0000 female at [redacted]w[redacted]d weeks gestation who presents to MAU reporting elevated blood pressures, blurry vision, heart palpitations, and a (frontal) H/A rated 7/10. She was seen in MAU on Friday 09/23/19 and Sunday 09/25/19 for elevated blood pressures. She was told to return to MAU For increased BP and/or H/A. She states that she got nervous when her heart started pounding in her chest. She reports good (+) FM.   Past Medical History:  Diagnosis Date  . Abnormal uterine bleeding   . Acid reflux   . Alpha thalassemia trait   . Amenorrhea   . Anemia    no current med.  . Complication of anesthesia    states had to keep giving her anesthesia during EGD  . Constipation   . Depression    "I'm good"  . Diabetes mellitus without complication ([place])    "prediabetes"  . Finger mass, left 03/2017   middle finger  . Gestational diabetes   . Headache   . Infection    UTI  . Irritable bowel syndrome (IBS)   . Morbid obesity with body mass index (BMI) of 45.0 to 49.9 in adult [facility])   . Plantar fasciitis, bilateral   . Polycystic ovary syndrome   . Shortness of breath   . Sleep apnea    no CPAP use    Past Surgical History:  Procedure Laterality Date  . COLONOSCOPY WITH PROPOFOL  10/07/2016  . ESOPHAGOGASTRODUODENOSCOPY  12/21/2015  . EXCISION MASS UPPER EXTREMETIES Left 04/21/2017   Procedure: EXCISION MASS LEFT MIDDLE FINGER;  Surgeon: Daryll Brod, MD;  Location: [place];  Service: Orthopedics;  Laterality: Left;    Family History  Problem Relation Age of Onset  . Diabetes Maternal Aunt   . Hypertension Maternal Uncle   . Depression Maternal Grandfather   . Diabetes Maternal Grandfather   . Hypertension Maternal Grandfather    . Arthritis Mother   . High blood pressure Mother   . Depression Mother   . Sleep apnea Mother   . Obesity Mother     Social History   Tobacco Use  . Smoking status: Never Smoker  . Smokeless tobacco: Never Used  Substance Use Topics  . Alcohol use: Not Currently    Comment: social   . Drug use: No    Allergies:  Allergies  Allergen Reactions  . Bee Pollen Hives, Shortness Of Breath and Swelling  . Bee Venom Hives, Shortness Of Breath and Swelling  . Hydrocodone-Acetaminophen Anaphylaxis    No problem when she takes Tylenol  . Peach Flavor Hives, Shortness Of Breath and Other (See Comments)    SWELLING OF MOUTH  . Pollen Extract Hives, Shortness Of Breath and Swelling    No medications prior to admission.    Review of Systems  Constitutional: Negative.   HENT: Negative.   Eyes: Negative.   Respiratory: Negative.   Cardiovascular: Negative.   Gastrointestinal: Negative.   Endocrine: Negative.   Genitourinary: Negative.   Musculoskeletal: Negative.   Skin: Negative.   Allergic/Immunologic: Negative.   Neurological: Positive for headaches.  Hematological: Negative.   Psychiatric/Behavioral: Negative.    Physical Exam   Patient Vitals for the past 24 hrs:  BP Temp src Pulse Resp SpO2 Height Weight  09/26/19 1759 134/75 - 98 18 - - -  09/26/19 1601 138/65 - 100 - - - -  09/26/19 1547 (!) 156/63 - 99 - - - -  09/26/19 1531 (!) 158/70 - 100 - - - -  09/26/19 1521 (!) 145/68 - 99 - - - -  09/26/19 1501 (!) 128/42 - 92 - - - -  09/26/19 1446 (!) 125/51 - 97 - - - -  09/26/19 1431 138/75 - 95 - - - -  09/26/19 1416 137/82 - 90 - - - -  09/26/19 1401 (!) 148/90 - (!) 106 - - - -  09/26/19 1346 (!) 141/74 - 92 - - - -  09/26/19 1331 122/75 - (!) 101 - - - -  09/26/19 1316 131/79 - 96 - - - -  09/26/19 1301 (!) 143/87 - 97 - 99 % - -  09/26/19 1246 (!) 143/78 - 93 - 98 % - -  09/26/19 1231 135/85 - 97 - 99 % - -  09/26/19 1228 (!) 144/82 - 100 - - - -   09/26/19 1209 (!) 142/86 Oral - 20 99 % 6\' 2"  (1.88 m) (!) 185.2 kg    Physical Exam  Nursing note and vitals reviewed. Constitutional: She is oriented to person, place, and time. She appears well-developed and well-nourished.  HENT:  Head: Normocephalic and atraumatic.  Eyes: Pupils are equal, round, and reactive to light.  Neck: Normal range of motion.  Cardiovascular: Normal rate.  Respiratory: Effort normal and breath sounds normal.  GI: Soft.  Musculoskeletal: Normal range of motion.  Neurological: She is alert and oriented to person, place, and time. She has normal reflexes.  Skin: Skin is warm and dry.  Psychiatric: She has a normal mood and affect. Her behavior is normal. Judgment and thought content normal.    NST - FHR: 150 bpm / moderate variability / accels present / decels absent / TOCO: none  MAU Course  Procedures  MDM CCUA CBC CMP P/C Ratio Serial BP's  Received migraine headache cocktail (IVFs with benadryl 25 mg IVP, metoclopramide 10 mg IVP, Decadron 10 mg IVP) -- reports relief. Pain 0/10 down from 7/10.    Results for orders placed or performed during the hospital encounter of 09/26/19 (from the past 24 hour(s))  Urinalysis, Routine w reflex microscopic     Status: Abnormal   Collection Time: 09/26/19 12:22 PM  Result Value Ref Range   Color, Urine YELLOW YELLOW   APPearance HAZY (A) CLEAR   Specific Gravity, Urine 1.021 1.005 - 1.030   pH 6.0 5.0 - 8.0   Glucose, UA NEGATIVE NEGATIVE mg/dL   Hgb urine dipstick NEGATIVE NEGATIVE   Bilirubin Urine NEGATIVE NEGATIVE   Ketones, ur NEGATIVE NEGATIVE mg/dL   Protein, ur NEGATIVE NEGATIVE mg/dL   Nitrite NEGATIVE NEGATIVE   Leukocytes,Ua NEGATIVE NEGATIVE  Protein / creatinine ratio, urine     Status: None   Collection Time: 09/26/19 12:24 PM  Result Value Ref Range   Creatinine, Urine 167.80 mg/dL   Total Protein, Urine 15 mg/dL   Protein Creatinine Ratio 0.09 0.00 - 0.15 mg/mg[Cre]  Glucose,  capillary     Status: None   Collection Time: 09/26/19  1:13 PM  Result Value Ref Range   Glucose-Capillary 93 70 - 99 mg/dL  CBC     Status: Abnormal   Collection Time: 09/26/19  1:54 PM  Result Value Ref Range   WBC  7.1 4.0 - 10.5 K/uL   RBC 5.02 3.87 - 5.11 MIL/uL   Hemoglobin 10.8 (L) 12.0 - 15.0 g/dL   HCT 34.9 (L) 36.0 - 46.0 %   MCV 69.5 (L) 80.0 - 100.0 fL   MCH 21.5 (L) 26.0 - 34.0 pg   MCHC 30.9 30.0 - 36.0 g/dL   RDW 15.7 (H) 11.5 - 15.5 %   Platelets 261 150 - 400 K/uL   nRBC 0.0 0.0 - 0.2 %  Comprehensive metabolic panel     Status: Abnormal   Collection Time: 09/26/19  1:54 PM  Result Value Ref Range   Sodium 137 135 - 145 mmol/L   Potassium 4.1 3.5 - 5.1 mmol/L   Chloride 108 98 - 111 mmol/L   CO2 21 (L) 22 - 32 mmol/L   Glucose, Bld 82 70 - 99 mg/dL   BUN <5 (L) 6 - 20 mg/dL   Creatinine, Ser 0.44 0.44 - 1.00 mg/dL   Calcium 9.2 8.9 - 10.3 mg/dL   Total Protein 6.4 (L) 6.5 - 8.1 g/dL   Albumin 2.6 (L) 3.5 - 5.0 g/dL   AST 16 15 - 41 U/L   ALT 16 0 - 44 U/L   Alkaline Phosphatase 107 38 - 126 U/L   Total Bilirubin 0.1 (L) 0.3 - 1.2 mg/dL   GFR calc non Af Amer >60 >60 mL/min   GFR calc Af Amer >60 >60 mL/min   Anion gap 8 5 - 15  Glucose, capillary     Status: Abnormal   Collection Time: 09/26/19  4:30 PM  Result Value Ref Range   Glucose-Capillary 110 (H) 70 - 99 mg/dL     Assessment and Plan  Gestational hypertension, third trimester - Continue checking BP at home - Call office for BP >140/90  Migraine without aura and without status migrainosus, not intractable - Advised to take Tylenol 1000 mg every 6 hours prn - Information provided on migraine h/a   - Discharge home - Keep scheduled appts - Patient verbalized an understanding of the plan of care and agrees.     Laury Deep, MSN, CNM 09/26/2019, 1:43 PM

## 2019-09-26 NOTE — MAU Note (Addendum)
Pt is G1P0 at [redacted]w[redacted]d who presents to MAU for elevated BP at home. She has a headache which has persisted throughout her pregnancy. Has RX for Flexeril, but has not taken any today.  Bps at home were 161/93 and 157/90.  Has ongoing blurry vision, but nothing new. Denies RUQ pain or new edema. Has occ. BH contractions. No LOF or VB, +Fm.

## 2019-09-26 NOTE — Telephone Encounter (Signed)
Pt called and reports that her BP is 161/97 this morning. Pt reports good fetal movement, no contractions. Pt states that she was at the hospital yesterday with symptoms of hypertension and she was told to come back if she started experiencing a headache. Pt verbalizes that she is experiencing a bad headache this morning so she is on her way back to the hospital. I advised patient that I agree with her decision to go back, she verbalizes understanding.

## 2019-09-27 ENCOUNTER — Ambulatory Visit (HOSPITAL_COMMUNITY)
Admission: RE | Admit: 2019-09-27 | Discharge: 2019-09-27 | Disposition: A | Discharge status: Home / Self Care | Payer: BC Managed Care – PPO | Source: Ambulatory Visit | Attending: Obstetrics and Gynecology | Admitting: Obstetrics and Gynecology

## 2019-09-27 ENCOUNTER — Encounter (HOSPITAL_COMMUNITY): Payer: Self-pay | Admitting: *Deleted

## 2019-09-27 ENCOUNTER — Ambulatory Visit (HOSPITAL_COMMUNITY): Payer: BC Managed Care – PPO | Admitting: *Deleted

## 2019-09-27 DIAGNOSIS — O99213 Obesity complicating pregnancy, third trimester: Secondary | ICD-10-CM | POA: Diagnosis not present

## 2019-09-27 DIAGNOSIS — Z362 Encounter for other antenatal screening follow-up: Secondary | ICD-10-CM

## 2019-09-27 DIAGNOSIS — O24414 Gestational diabetes mellitus in pregnancy, insulin controlled: Secondary | ICD-10-CM

## 2019-09-27 DIAGNOSIS — Z3A35 35 weeks gestation of pregnancy: Secondary | ICD-10-CM | POA: Diagnosis not present

## 2019-09-27 DIAGNOSIS — O133 Gestational [pregnancy-induced] hypertension without significant proteinuria, third trimester: Secondary | ICD-10-CM

## 2019-09-27 DIAGNOSIS — O24415 Gestational diabetes mellitus in pregnancy, controlled by oral hypoglycemic drugs: Secondary | ICD-10-CM | POA: Diagnosis present

## 2019-09-28 ENCOUNTER — Telehealth (HOSPITAL_COMMUNITY): Payer: Self-pay | Admitting: *Deleted

## 2019-09-28 ENCOUNTER — Encounter (HOSPITAL_COMMUNITY): Payer: Self-pay | Admitting: *Deleted

## 2019-09-28 LAB — CULTURE, BETA STREP (GROUP B ONLY)

## 2019-09-28 NOTE — Telephone Encounter (Signed)
Preadmission screen  

## 2019-10-04 ENCOUNTER — Encounter (HOSPITAL_COMMUNITY): Payer: Self-pay | Admitting: *Deleted

## 2019-10-04 ENCOUNTER — Ambulatory Visit (HOSPITAL_COMMUNITY)
Admission: RE | Admit: 2019-10-04 | Discharge: 2019-10-04 | Disposition: A | Discharge status: Home / Self Care | Payer: BC Managed Care – PPO | Source: Ambulatory Visit | Attending: Obstetrics and Gynecology | Admitting: Obstetrics and Gynecology

## 2019-10-04 ENCOUNTER — Other Ambulatory Visit: Payer: Self-pay | Admitting: Women's Health

## 2019-10-04 ENCOUNTER — Other Ambulatory Visit: Payer: Self-pay

## 2019-10-04 ENCOUNTER — Ambulatory Visit (HOSPITAL_COMMUNITY): Payer: BC Managed Care – PPO | Admitting: *Deleted

## 2019-10-04 DIAGNOSIS — O133 Gestational [pregnancy-induced] hypertension without significant proteinuria, third trimester: Secondary | ICD-10-CM | POA: Insufficient documentation

## 2019-10-04 DIAGNOSIS — O24414 Gestational diabetes mellitus in pregnancy, insulin controlled: Secondary | ICD-10-CM | POA: Diagnosis not present

## 2019-10-04 DIAGNOSIS — Z3A36 36 weeks gestation of pregnancy: Secondary | ICD-10-CM | POA: Diagnosis not present

## 2019-10-04 DIAGNOSIS — O99213 Obesity complicating pregnancy, third trimester: Secondary | ICD-10-CM | POA: Diagnosis not present

## 2019-10-05 ENCOUNTER — Encounter: Payer: Self-pay | Admitting: Family Medicine

## 2019-10-05 ENCOUNTER — Other Ambulatory Visit (HOSPITAL_COMMUNITY)
Admission: RE | Admit: 2019-10-05 | Discharge: 2019-10-05 | Disposition: A | Discharge status: Home / Self Care | Payer: BC Managed Care – PPO | Source: Ambulatory Visit | Attending: Obstetrics and Gynecology | Admitting: Obstetrics and Gynecology

## 2019-10-05 ENCOUNTER — Encounter: Payer: BC Managed Care – PPO | Admitting: Family Medicine

## 2019-10-05 ENCOUNTER — Ambulatory Visit (INDEPENDENT_AMBULATORY_CARE_PROVIDER_SITE_OTHER): Payer: BC Managed Care – PPO | Admitting: Family Medicine

## 2019-10-05 VITALS — BP 139/86 | HR 101 | Temp 98.6°F | Wt >= 6400 oz

## 2019-10-05 DIAGNOSIS — Z113 Encounter for screening for infections with a predominantly sexual mode of transmission: Secondary | ICD-10-CM | POA: Diagnosis not present

## 2019-10-05 DIAGNOSIS — Z3A36 36 weeks gestation of pregnancy: Secondary | ICD-10-CM

## 2019-10-05 DIAGNOSIS — Z20828 Contact with and (suspected) exposure to other viral communicable diseases: Secondary | ICD-10-CM | POA: Insufficient documentation

## 2019-10-05 DIAGNOSIS — Z01812 Encounter for preprocedural laboratory examination: Secondary | ICD-10-CM | POA: Insufficient documentation

## 2019-10-05 DIAGNOSIS — Z3403 Encounter for supervision of normal first pregnancy, third trimester: Secondary | ICD-10-CM

## 2019-10-05 DIAGNOSIS — O99213 Obesity complicating pregnancy, third trimester: Secondary | ICD-10-CM

## 2019-10-05 DIAGNOSIS — O133 Gestational [pregnancy-induced] hypertension without significant proteinuria, third trimester: Secondary | ICD-10-CM

## 2019-10-05 DIAGNOSIS — O24414 Gestational diabetes mellitus in pregnancy, insulin controlled: Secondary | ICD-10-CM

## 2019-10-05 LAB — SARS CORONAVIRUS 2 (TAT 6-24 HRS): SARS Coronavirus 2: NEGATIVE

## 2019-10-05 NOTE — Patient Instructions (Signed)

## 2019-10-05 NOTE — Progress Notes (Signed)
   Subjective:  Maureen Lewis is a 23 y.o. G1P0000 at [redacted]w[redacted]d being seen today for ongoing prenatal care.  She is currently monitored for the following issues for this high-risk pregnancy and has Shoulder pain, left; Constipation; GERD (gastroesophageal reflux disease); Back pain; Pseudotumor cerebri syndrome; Obesity hypoventilation syndrome ([place]); Benign paroxysmal positional vertigo due to bilateral vestibular disorder; OSA (obstructive sleep apnea); Hypersomnia with sleep apnea; Female hirsutism; PCOS (polycystic ovarian syndrome); Vitamin D deficiency; Prediabetes; Depression; Class 3 severe obesity with serious comorbidity and body mass index (BMI) of 50.0 to 59.9 in adult [facility]); Anemia; Mass; Vertigo; Supervision of normal first pregnancy; Obesity during pregnancy, antepartum; Nausea and vomiting during pregnancy; Gestational diabetes; Alpha thalassemia trait; Migraine headache; and Gestational hypertension on their problem list.  Patient reports no complaints.  Contractions: Irregular. Vag. Bleeding: None.  Movement: Present. Denies leaking of fluid.   The following portions of the patient's history were reviewed and updated as appropriate: allergies, current medications, past family history, past medical history, past social history, past surgical history and problem list. Problem list updated.  Objective:   Vitals:   10/05/19 0909 10/05/19 0910  BP: (!) 145/93 139/86  Pulse: (!) 101   Temp: 98.6 F (37 C)   Weight: (!) 411 lb 12.8 oz (186.8 kg)     Fetal Status: Fetal Heart Rate (bpm): 165   Movement: Present     General:  Alert, oriented and cooperative. Patient is in no acute distress.  Skin: Skin is warm and dry. No rash noted.   Cardiovascular: Normal heart rate noted  Respiratory: Normal respiratory effort, no problems with respiration noted  Abdomen: Soft, gravid, appropriate for gestational age. Pain/Pressure: Absent     Pelvic: Vag. Bleeding: None     Cervical exam deferred         Extremities: Normal range of motion.     Mental Status: Normal mood and affect. Normal behavior. Normal judgment and thought content.   Urinalysis:      Assessment and Plan:  Pregnancy: G1P0000 at [redacted]w[redacted]d  1. Gestational hypertension, third trimester IOL scheduled for 10/07/2019 Denies headache, vision changes, chest pain, SOB, LE edema Reviewed warning signs  2. Insulin controlled gestational diabetes mellitus (GDM) in third trimester Does not bring log, reports baby scripts not working Compliant with meds Reports all fastings <95 Reports all PP 110-120, about 50-70% are "close or at 120" Has had normal ante testing to date  3. Class 3 severe obesity due to excess calories with serious comorbidity and body mass index (BMI) of 50.0 to 59.9 in adult ([place])   4. Encounter for supervision of normal first pregnancy in third trimester GC/CT swab today, GBS collected previously in MAU was negative Discussed contraception at length, does not want IUD or Nexplanon Conceived while on birth control pills Referred to [facility], she will cont to assess options FHR 165 but has had higher baseline throughout pregnancy  Term labor symptoms and general obstetric precautions including but not limited to vaginal bleeding, contractions, leaking of fluid and fetal movement were reviewed in detail with the patient. Please refer to After Visit Summary for other counseling recommendations.  Return in about 6 weeks (around 11/16/2019) for PP check.   Clarnce Flock, MD

## 2019-10-05 NOTE — Progress Notes (Signed)
Pt presents for ROB. CBG readings not available; however, pt states readings are WNL. Fasting <95 and postprandial 110-120. GBS neg 09/26/2019

## 2019-10-06 LAB — CERVICOVAGINAL ANCILLARY ONLY
Chlamydia: NEGATIVE
Comment: NEGATIVE
Comment: NORMAL
Neisseria Gonorrhea: NEGATIVE

## 2019-10-07 ENCOUNTER — Other Ambulatory Visit: Payer: Self-pay

## 2019-10-07 ENCOUNTER — Inpatient Hospital Stay (HOSPITAL_COMMUNITY)
Admission: AD | Admit: 2019-10-07 | Discharge: 2019-10-11 | DRG: 788 | Disposition: A | Discharge status: Home / Self Care | Payer: BC Managed Care – PPO | Attending: Obstetrics and Gynecology | Admitting: Obstetrics and Gynecology

## 2019-10-07 ENCOUNTER — Encounter (HOSPITAL_COMMUNITY): Payer: Self-pay | Admitting: Obstetrics and Gynecology

## 2019-10-07 ENCOUNTER — Inpatient Hospital Stay (HOSPITAL_COMMUNITY): Payer: BC Managed Care – PPO

## 2019-10-07 DIAGNOSIS — E282 Polycystic ovarian syndrome: Secondary | ICD-10-CM | POA: Diagnosis present

## 2019-10-07 DIAGNOSIS — O9902 Anemia complicating childbirth: Secondary | ICD-10-CM | POA: Diagnosis present

## 2019-10-07 DIAGNOSIS — O24414 Gestational diabetes mellitus in pregnancy, insulin controlled: Secondary | ICD-10-CM | POA: Diagnosis not present

## 2019-10-07 DIAGNOSIS — Z20828 Contact with and (suspected) exposure to other viral communicable diseases: Secondary | ICD-10-CM | POA: Diagnosis present

## 2019-10-07 DIAGNOSIS — Z3A37 37 weeks gestation of pregnancy: Secondary | ICD-10-CM

## 2019-10-07 DIAGNOSIS — Z3400 Encounter for supervision of normal first pregnancy, unspecified trimester: Secondary | ICD-10-CM

## 2019-10-07 DIAGNOSIS — A530 Latent syphilis, unspecified as early or late: Secondary | ICD-10-CM

## 2019-10-07 DIAGNOSIS — O24424 Gestational diabetes mellitus in childbirth, insulin controlled: Secondary | ICD-10-CM | POA: Diagnosis present

## 2019-10-07 DIAGNOSIS — O134 Gestational [pregnancy-induced] hypertension without significant proteinuria, complicating childbirth: Secondary | ICD-10-CM | POA: Diagnosis present

## 2019-10-07 DIAGNOSIS — O99214 Obesity complicating childbirth: Secondary | ICD-10-CM | POA: Diagnosis present

## 2019-10-07 DIAGNOSIS — I10 Essential (primary) hypertension: Secondary | ICD-10-CM | POA: Diagnosis present

## 2019-10-07 DIAGNOSIS — O139 Gestational [pregnancy-induced] hypertension without significant proteinuria, unspecified trimester: Secondary | ICD-10-CM | POA: Diagnosis present

## 2019-10-07 DIAGNOSIS — O133 Gestational [pregnancy-induced] hypertension without significant proteinuria, third trimester: Secondary | ICD-10-CM | POA: Diagnosis not present

## 2019-10-07 DIAGNOSIS — O24419 Gestational diabetes mellitus in pregnancy, unspecified control: Secondary | ICD-10-CM | POA: Diagnosis present

## 2019-10-07 DIAGNOSIS — D649 Anemia, unspecified: Secondary | ICD-10-CM | POA: Diagnosis present

## 2019-10-07 DIAGNOSIS — E66813 Obesity, class 3: Secondary | ICD-10-CM

## 2019-10-07 HISTORY — DX: Prediabetes: R73.03

## 2019-10-07 LAB — COMPREHENSIVE METABOLIC PANEL
ALT: 14 U/L (ref 0–44)
AST: 15 U/L (ref 15–41)
Albumin: 2.6 g/dL — ABNORMAL LOW (ref 3.5–5.0)
Alkaline Phosphatase: 112 U/L (ref 38–126)
Anion gap: 9 (ref 5–15)
BUN: 8 mg/dL (ref 6–20)
CO2: 20 mmol/L — ABNORMAL LOW (ref 22–32)
Calcium: 8.8 mg/dL — ABNORMAL LOW (ref 8.9–10.3)
Chloride: 108 mmol/L (ref 98–111)
Creatinine, Ser: 0.47 mg/dL (ref 0.44–1.00)
GFR calc Af Amer: >60 mL/min (ref >60)
GFR calc non Af Amer: >60 mL/min (ref >60)
Glucose, Bld: 100 mg/dL — ABNORMAL HIGH (ref 70–99)
Potassium: 4 mmol/L (ref 3.5–5.1)
Sodium: 137 mmol/L (ref 135–145)
Total Bilirubin: 0.5 mg/dL (ref 0.3–1.2)
Total Protein: 6.5 g/dL (ref 6.5–8.1)

## 2019-10-07 LAB — GLUCOSE, CAPILLARY
Glucose-Capillary: 116 mg/dL — ABNORMAL HIGH (ref 70–99)
Glucose-Capillary: 123 mg/dL — ABNORMAL HIGH (ref 70–99)
Glucose-Capillary: 89 mg/dL (ref 70–99)
Glucose-Capillary: 104 mg/dL — ABNORMAL HIGH (ref 70–99)
Glucose-Capillary: 104 mg/dL — ABNORMAL HIGH (ref 70–99)

## 2019-10-07 LAB — CBC
HCT: 34.2 % — ABNORMAL LOW (ref 36.0–46.0)
Hemoglobin: 10.3 g/dL — ABNORMAL LOW (ref 12.0–15.0)
MCH: 21.3 pg — ABNORMAL LOW (ref 26.0–34.0)
MCHC: 30.1 g/dL (ref 30.0–36.0)
MCV: 70.8 fL — ABNORMAL LOW (ref 80.0–100.0)
Platelets: 239 10*3/uL (ref 150–400)
RBC: 4.83 MIL/uL (ref 3.87–5.11)
RDW: 15.9 % — ABNORMAL HIGH (ref 11.5–15.5)
WBC: 7.9 10*3/uL (ref 4.0–10.5)
nRBC: 0 % (ref 0.0–0.2)

## 2019-10-07 LAB — TYPE AND SCREEN
ABO/RH(D): A POS
Antibody Screen: NEGATIVE

## 2019-10-07 LAB — PROTEIN / CREATININE RATIO, URINE
Creatinine, Urine: 247.62 mg/dL
Protein Creatinine Ratio: 0.09 mg/mg{Cre} (ref 0.00–0.15)
Total Protein, Urine: 22 mg/dL

## 2019-10-07 LAB — RPR
RPR Ser Ql: REACTIVE — AB
RPR Titer: 1:1 {titer}

## 2019-10-07 MED ORDER — ZOLPIDEM TARTRATE 5 MG PO TABS
5.0000 mg | ORAL_TABLET | Freq: Every evening | ORAL | Status: DC | PRN
  Administered 2019-10-07: 5 mg via ORAL
  Filled 2019-10-07: qty 1

## 2019-10-07 MED ORDER — LOPERAMIDE HCL 2 MG PO CAPS
2.0000 mg | ORAL_CAPSULE | Freq: Once | ORAL | Status: DC
  Filled 2019-10-07: qty 1

## 2019-10-07 MED ORDER — LACTATED RINGERS IV SOLN: 500.0000 mL | INTRAVENOUS | Status: DC | PRN

## 2019-10-07 MED ORDER — MISOPROSTOL 25 MCG QUARTER TABLET
25.0000 ug | ORAL_TABLET | Freq: Once | ORAL | Status: AC
  Administered 2019-10-07: 25 ug via VAGINAL
  Filled 2019-10-07: qty 1

## 2019-10-07 MED ORDER — OXYTOCIN BOLUS FROM INFUSION: 500.0000 mL | Freq: Once | INTRAVENOUS | Status: DC

## 2019-10-07 MED ORDER — ACETAMINOPHEN 325 MG PO TABS
650.0000 mg | ORAL_TABLET | ORAL | Status: DC | PRN
  Administered 2019-10-07 – 2019-10-09 (×4): 650 mg via ORAL
  Filled 2019-10-07 (×4): qty 2

## 2019-10-07 MED ORDER — TERBUTALINE SULFATE 1 MG/ML IJ SOLN: 0.2500 mg | Freq: Once | INTRAMUSCULAR | Status: DC | PRN

## 2019-10-07 MED ORDER — OXYTOCIN 40 UNITS IN NORMAL SALINE INFUSION - SIMPLE MED
1.0000 m[IU]/min | INTRAVENOUS | Status: DC
  Administered 2019-10-08: 1 m[IU]/min via INTRAVENOUS
  Filled 2019-10-07: qty 1000

## 2019-10-07 MED ORDER — OXYTOCIN 40 UNITS IN NORMAL SALINE INFUSION - SIMPLE MED: 1.0000 m[IU]/min | INTRAVENOUS | Status: DC

## 2019-10-07 MED ORDER — MISOPROSTOL 50MCG HALF TABLET
50.0000 ug | ORAL_TABLET | ORAL | Status: DC | PRN
  Administered 2019-10-07 (×4): 50 ug via ORAL
  Filled 2019-10-07 (×4): qty 1

## 2019-10-07 MED ORDER — METFORMIN HCL ER 500 MG PO TB24
1000.0000 mg | ORAL_TABLET | Freq: Two times a day (BID) | ORAL | Status: DC
  Administered 2019-10-07 – 2019-10-09 (×6): 1000 mg via ORAL
  Filled 2019-10-07 (×9): qty 2

## 2019-10-07 MED ORDER — HYDROXYZINE HCL 50 MG PO TABS: 50.0000 mg | ORAL_TABLET | Freq: Four times a day (QID) | ORAL | Status: DC | PRN

## 2019-10-07 MED ORDER — LACTATED RINGERS IV SOLN: INTRAVENOUS | Status: DC

## 2019-10-07 MED ORDER — ONDANSETRON HCL 4 MG/2ML IJ SOLN
4.0000 mg | Freq: Four times a day (QID) | INTRAMUSCULAR | Status: DC | PRN
  Administered 2019-10-07 – 2019-10-08 (×2): 4 mg via INTRAVENOUS
  Filled 2019-10-07 (×2): qty 2

## 2019-10-07 MED ORDER — INSULIN NPH (HUMAN) (ISOPHANE) 100 UNIT/ML ~~LOC~~ SUSP: 10.0000 [IU] | Freq: Every day | SUBCUTANEOUS | Status: DC

## 2019-10-07 MED ORDER — PENICILLIN G POT IN DEXTROSE 60000 UNIT/ML IV SOLN: 3.0000 10*6.[IU] | INTRAVENOUS | Status: DC

## 2019-10-07 MED ORDER — FENTANYL CITRATE (PF) 100 MCG/2ML IJ SOLN
100.0000 ug | INTRAMUSCULAR | Status: DC | PRN
  Administered 2019-10-08: 100 ug via INTRAVENOUS
  Filled 2019-10-07: qty 2

## 2019-10-07 MED ORDER — INSULIN NPH (HUMAN) (ISOPHANE) 100 UNIT/ML ~~LOC~~ SUSP
14.0000 [IU] | Freq: Every day | SUBCUTANEOUS | Status: DC
  Administered 2019-10-07 – 2019-10-08 (×3): 14 [IU] via SUBCUTANEOUS
  Filled 2019-10-07 (×2): qty 10

## 2019-10-07 MED ORDER — OXYTOCIN 40 UNITS IN NORMAL SALINE INFUSION - SIMPLE MED: 2.5000 [IU]/h | INTRAVENOUS | Status: DC

## 2019-10-07 MED ORDER — LOPERAMIDE HCL 2 MG PO CAPS
2.0000 mg | ORAL_CAPSULE | ORAL | Status: DC | PRN
  Administered 2019-10-07: 2 mg via ORAL
  Filled 2019-10-07 (×3): qty 1

## 2019-10-07 MED ORDER — CLINDAMYCIN PHOSPHATE 900 MG/50ML IV SOLN: 900.0000 mg | Freq: Three times a day (TID) | INTRAVENOUS | Status: DC

## 2019-10-07 MED ORDER — LIDOCAINE HCL (PF) 1 % IJ SOLN: 30.0000 mL | INTRAMUSCULAR | Status: DC | PRN

## 2019-10-07 MED ORDER — SODIUM CHLORIDE 0.9 % IV SOLN: 5.0000 10*6.[IU] | Freq: Once | INTRAVENOUS | Status: DC

## 2019-10-07 NOTE — H&P (Addendum)
OBSTETRIC ADMISSION HISTORY AND PHYSICAL  Maureen Lewis is a 23 y.o. female G1P0000 with IUP at 57w0dby LMP, confirmed w/ 5 wk UKoreapresenting for IOL due to gHTN and A2gDM. She reports +FMs, No LOF, no VB, no blurry vision, headaches or peripheral edema, and RUQ pain. She has been controlling her gDM w/ 14u NPH qhs, metformin 1000 BID. She plans on breast feeding. She request condoms for birth control. [place]: By LMP --->  Estimated Date of Delivery: 10/28/19  Sono:    '@[redacted]w[redacted]d'$ , CWD, normal anatomy, cephalic presentation, posterior placenta lie, 2949g, 75% EFW   Prenatal History/Complications: gHTN [id number] metfomrin, NPH Maternal obesity PCOS Alpha thal trait  Past Medical History: Past Medical History:  Diagnosis Date   Abnormal uterine bleeding    Acid reflux    Alpha thalassemia trait    Amenorrhea    Anemia    no current med.   Complication of anesthesia    states had to keep giving her anesthesia during EGD   Constipation    Depression    "I'm good"   Di[place]    "prediabetes"   Finger mass, left 03/2017   middle finger   Gestational diabetes    Headache    Infection    UTI   Irritable bowel syndrome (IBS)    Morbid obesity with body mass index (BMI) of 45.0 to 49.9 in adult ([facility]    Plantar fasciitis, bilateral    Polycystic ovary syndrome    Pregnancy induced hypertension    Shortness of breath    Sleep apnea    no CPAP use    Past Surgical History: Past Surgical History:  Procedure Laterality Date   COLONOSCOPY WITH PROPOFOL  10/07/2016   ESOPHAGOGASTRODUODENOSCOPY  12/21/2015   EXCISION MASS UPPER EXTREMETIES Left 04/21/2017   Procedure: EXCISION MASS LEFT MIDDLE FINGER;  Surgeon: KDaryll Brod MD;  [place]  Service: Orthopedics;  Laterality: Left;    Obstetrical History: OB History     Gravida  1   Para  0   Term  0   Preterm  0   AB   0   Living  0      SAB  0   TAB  0   Ectopic  0   Multiple  0   Live Births  0           Social History: Social History   Socioeconomic History   Marital status: Single    Spouse name: Not on file   Number of children: Not on file   Years of education: Not on file   Highest education level: Not on file  Occupational History   Occupation: sChemical engineer   Employer: [employer] Tobacco Use   Smoking status: Never Smoker   Smokeless tobacco: Never Used  Substance and Sexual Activity   Alcohol use: Not Currently    Comment: social    Drug use: No   Sexual activity: Yes    Birth control/protection: None  Other Topics Concern   Not on file  Social History Narrative   [place]ives with mom and cousin, [place]       She is in nursing school.    Social Determinants of Health   Financial Resource Strain:    Difficulty of Paying Living Expenses: Not on file  Food Insecurity:  Worried About Charity fundraiser in the Last Year: Not on file   [employer] of Food in the Last Year: Not on file  Transportation Needs:    Lack of Transportation (Medical): Not on file   Lack of Transportation (Non-Medical): Not on file  Physical Activity:    Days of Exercise per Week: Not on file   Minutes of Exercise per Session: Not on file  Stress:    Feeling of Stress : Not on file  Social Connections:    Frequency of Communication with Friends and Family: Not on file   Frequency of Social Gatherings with Friends and Family: Not on file   Attends Religious Services: Not on file   Active Member of Clubs or Organizations: Not on file   Attends Archivist Meetings: Not on file   Marital Status: Not on file    Family History: Family History  Problem Relation Age of Onset   Diabetes Maternal Aunt    Hypertension Maternal Uncle    Depression Maternal Grandfather    Diabetes Maternal Grandfather    Hypertension Maternal  Grandfather    Arthritis Mother    High blood pressure Mother    Depression Mother    Sleep apnea Mother    Obesity Mother     Allergies: Allergies  Allergen Reactions   Bee Pollen Hives, Shortness Of Breath and Swelling   Bee Venom Hives, Shortness Of Breath and Swelling   Hydrocodone-Acetaminophen Anaphylaxis    No problem when she takes Tylenol   Peach Flavor Hives, Shortness Of Breath and Other (See Comments)    SWELLING OF MOUTH   Pollen Extract Hives, Shortness Of Breath and Swelling    Facility-Administered Medications Prior to Admission  Medication Dose Route Frequency Provider Last Rate Last Admin   insulin NPH Human (NOVOLIN N) injection 10 Units  10 Units Subcutaneous QHS Sloan Leiter, MD       Medications Prior to Admission  Medication Sig Dispense Refill Last Dose   Accu-Chek FastClix Lancets MISC 1 Units by Percutaneous route 4 (four) times daily. 100 each 12    acetaminophen (TYLENOL) 325 MG tablet Take 650 mg by mouth every 6 (six) hours as needed.      aspirin EC 81 MG tablet Take 1 tablet (81 mg total) by mouth daily. Take after 12 weeks for prevention of preeclampsia later in pregnancy-do not start until 04/17/19 300 tablet 0    Blood Glucose Monitoring Suppl (ACCU-CHEK GUIDE) w/Device KIT 1 Device by Does not apply route 4 (four) times daily. 1 kit 0    Blood Pressure Monitoring KIT 1 kit by Does not apply route once a week. 1 kit 0    cyclobenzaprine (FLEXERIL) 10 MG tablet Take 1 tablet (10 mg total) by mouth 2 (two) times daily as needed for muscle spasms. 60 tablet 0    Elastic Bandages & Supports (COMFORT FIT MATERNITY SUPP SM) MISC Wear as directed. 1 each 0    glucose blood (ACCU-CHEK GUIDE) test strip Use to check blood sugars four times a day was instructed 50 each 12    insulin NPH Human (NOVOLIN N) 100 UNIT/ML injection Inject 0.14 mLs (14 Units total) into the skin at bedtime. 10 mL 3    loratadine (CLARITIN) 10 MG tablet Take 10 mg by mouth daily  as needed for allergies.       metFORMIN (GLUCOPHAGE XR) 500 MG 24 hr tablet Take 2 tablets (1,000 mg total) by mouth 2 (  two) times daily after a meal. Take after breakfast and after supper. 60 tablet 5    ondansetron (ZOFRAN ODT) 4 MG disintegrating tablet Take 1 tablet (4 mg total) by mouth every 8 (eight) hours as needed for nausea or vomiting. 30 tablet 1    Prenatal Vit-Fe Fumarate-FA (PRENATAL VITAMINS PO) Take by mouth.        Review of Systems   All systems reviewed and negative except as stated in HPI  Blood pressure (!) 113/49, pulse 100, temperature 98.6 F (37 C), temperature source Oral, resp. rate 18, height '6\' 2"'$  (1.88 m), weight (!) 187.8 kg, last menstrual period 01/21/2019, unknown if currently breastfeeding. General appearance: alert, cooperative, appears stated age, no distress and morbidly obese Lungs: clear to auscultation bilaterally Heart: regular rate and rhythm Abdomen: soft, non-tender; bowel sounds normal Pelvic: Closed/Thick/High Extremities: Homans sign is negative, no sign of DVT Presentation: cephalic, confirmed vis US Fetal monitoringBaseline: 150 bpm, Variability: Good {> 6 bpm), Accelerations: Reactive and Decelerations: Absent Uterine activityFrequency: Every 6-10 minutes Dilation: Closed Effacement (%): Thick Station: Ballotable Exam by:: Dr. Lia Foyer   Prenatal labs: ABO, Rh: --/--/A POS (12/18 0029) Antibody: NEG (12/18 0029) Rubella: 12.80 (06/17 1040) RPR: Non Reactive (11/05 1658)  HBsAg: Negative (06/17 1040)  HIV: Non Reactive (11/05 1658)  GBS: Negative/-- (12/07 0000)  2 hr Glucola 103, 138, 125 Genetic screening  Low risk Anatomy US normal  Prenatal Transfer Tool  Maternal Diabetes: Yes:  Diabetes Type:  Insulin/Medication controlled Genetic Screening: Normal Maternal Ultrasounds/Referrals: Normal Fetal Ultrasounds or other Referrals:  Referred to Materal Fetal Medicine  Maternal Substance Abuse:  No Significant Maternal  Medications:  Meds include: Other: metformin, NPH, ASA Significant Maternal Lab Results: Group B Strep negative  Results for orders placed or performed during the hospital encounter of 10/07/19 (from the past 24 hour(s))  CBC   Collection Time: 10/07/19 12:08 AM  Result Value Ref Range   WBC 7.9 4.0 - 10.5 K/uL   RBC 4.83 3.87 - 5.11 MIL/uL   Hemoglobin 10.3 (L) 12.0 - 15.0 g/dL   HCT 34.2 (L) 36.0 - 46.0 %   MCV 70.8 (L) 80.0 - 100.0 fL   MCH 21.3 (L) 26.0 - 34.0 pg   MCHC 30.1 30.0 - 36.0 g/dL   RDW 15.9 (H) 11.5 - 15.5 %   Platelets 239 150 - 400 K/uL   nRBC 0.0 0.0 - 0.2 %  Comprehensive metabolic panel   Collection Time: 10/07/19 12:08 AM  Result Value Ref Range   Sodium 137 135 - 145 mmol/L   Potassium 4.0 3.5 - 5.1 mmol/L   Chloride 108 98 - 111 mmol/L   CO2 20 (L) 22 - 32 mmol/L   Glucose, Bld 100 (H) 70 - 99 mg/dL   BUN 8 6 - 20 mg/dL   Creatinine, Ser 0.47 0.44 - 1.00 mg/dL   Calcium 8.8 (L) 8.9 - 10.3 mg/dL   Total Protein 6.5 6.5 - 8.1 g/dL   Albumin 2.6 (L) 3.5 - 5.0 g/dL   AST 15 15 - 41 U/L   ALT 14 0 - 44 U/L   Alkaline Phosphatase 112 38 - 126 U/L   Total Bilirubin 0.5 0.3 - 1.2 mg/dL   GFR calc non Af Amer >60 >60 mL/min   GFR calc Af Amer >60 >60 mL/min   Anion gap 9 5 - 15  Type and screen   Collection Time: 10/07/19 12:29 AM  Result Value Ref Range   ABO/RH(D) A  POS    Antibody Screen NEG    Sample Expiration      10/10/2019,2359 Performed at [facility], [place] [address], [place], [place] [address]   Protein / creatinine ratio, urine   Collection Time: 10/07/19 12:59 AM  Result Value Ref Range   Creatinine, Urine 247.62 mg/dL   Total Protein, Urine 22 mg/dL   Protein Creatinine Ratio 0.09 0.00 - 0.15 mg/mg[Cre]    Patient Active Problem List   Diagnosis Date Noted   Gestational hypertension 09/23/2019   Migraine headache 07/12/2019   Alpha thalassemia trait    Gestational diabetes 05/23/2019   Supervision of normal first pregnancy  04/06/2019   Obesity during pregnancy, antepartum 04/06/2019   Nausea and vomiting during pregnancy 04/06/2019   Vitamin D deficiency 09/16/2017   Prediabetes 09/16/2017   Depression 09/16/2017   Class 3 severe obesity with serious comorbidity and body mass index (BMI) of 50.0 to 59.9 in adult [facility]) 09/16/2017   PCOS (polycystic ovarian syndrome) 08/13/2017   Mass 05/06/2017   Vertigo 10/06/2016   Anemia 09/26/2016   Pseudotumor cerebri syndrome 08/28/2016   Obesity hypoventilation syndrome ([place]) 08/28/2016   Benign paroxysmal positional vertigo due to bilateral vestibular disorder 08/28/2016   OSA (obstructive sleep apnea) 08/28/2016   Hypersomnia with sleep apnea 08/28/2016   Female hirsutism 08/28/2016   Back pain 04/16/2015   GERD (gastroesophageal reflux disease) 02/24/2015   Constipation 10/02/2014   Shoulder pain, left 01/29/2014    Assessment/Plan:  Maureen Lewis is a 23 y.o. G1P0000 at 63w0dhere for IOL due to [place]  #Labor: closed/thick/high on exam, will dose cytotec 586m BU and attempt FB when cervix is softer. Anticipate vaginal delivery. #Pain: Would like to have batural birth, allow IV pain meds and epidural upon request.  #FWB: Cat 1 FHT, cont to monitor #ID:  GBS neg. COVID neg 12/16. #MOF: breast #MOC:condoms #gHTN:BP well controlled w/o intervention, cont to monitor. Check urine Pr/Cr ratio, CMP, CBC.  #A2gDM: dose NPH 14 u tonight. Check BG q4h in latent labor and q2h in active labor. Add SSI if needed  KeDemetrius RevelMD  10/07/2019, 2:35 AM  I personally saw and evaluated the patient, performing the key elements of the service. I developed and verified the management plan that is described in the resident's/student's note, and I agree with the content with my edits above. VSS, HRR&R, Resp unlabored, Legs neg.  FrNigel BertholdCNM 10/07/2019 6:42 AM

## 2019-10-07 NOTE — Progress Notes (Signed)
Maureen Lewis is a 23 y.o. G1P0000 at [redacted]w[redacted]d admitted for induction of labor due to Gestational diabetes.  Subjective: Comfortable, feeling some contractions.  Objective: BP (!) 139/91   Pulse (!) 102   Temp 98.8 F (37.1 C) (Oral)   Resp 18   Ht 6\' 2"  (1.88 m)   Wt (!) 187.8 kg   LMP 01/21/2019 (Exact Date)   BMI 53.17 kg/m  Total I/O In: 806.4 [I.V.:806.4] Out: -   FHT:  FHR: 155 bpm, variability: moderate,  accelerations:  Present,  decelerations:  Absent UC:   Minimal on Toco  SVE:   Dilation: Closed Effacement (%): Thick Station: -2 Exam by:: Dr. Volanda Napoleon    Labs: Lab Results  Component Value Date   WBC 7.9 10/07/2019   HGB 10.3 (L) 10/07/2019   HCT 34.2 (L) 10/07/2019   MCV 70.8 (L) 10/07/2019   PLT 239 10/07/2019    Assessment / Plan: Maureen Lewis is a 23 y.o G1P0 at [redacted]w[redacted]d here for IOL for A2GDM  Labor: s/p Cytotec x3. Repeat Cytotec.  Consider Pitocin, AROM as appropriate Fetal Wellbeing:  Category I Pain Control:  Epidural and IV pain meds at patients request I/D:  GBS negative Anticipated MOD:  Vaginal Delivery, CS as appropriate  Carollee Leitz MD PGY1 Family Med Practice 10/07/2019, 2:22 PM

## 2019-10-07 NOTE — Progress Notes (Addendum)
Maureen Lewis is a 24 y.o. G1P0000 at [redacted]w[redacted]d admitted for induction of labor due to Gestational diabetes.  Subjective: Comfortable, feeling fetal movement. Minimal contractions  Objective: BP 132/87   Pulse (!) 102   Temp 98 F (36.7 C) (Oral)   Resp 16   Ht 6\' 2"  (1.88 m)   Wt (!) 187.8 kg   LMP 01/21/2019 (Exact Date)   BMI 53.17 kg/m  No intake/output data recorded.  FHT:  FHR: 150-160 bpm, variability: Moderate,  accelerations: Present,  decelerations:  Absent UC:   Minimal on Toco  SVE:   Dilation: Fingertip Effacement (%): 50 Station: -3 Exam by:: Dr. Darene Lamer   Labs: Lab Results  Component Value Date   WBC 7.9 10/07/2019   HGB 10.3 (L) 10/07/2019   HCT 34.2 (L) 10/07/2019   MCV 70.8 (L) 10/07/2019   PLT 239 10/07/2019    Assessment / Plan: Maureen Lewis is a 23 y.o G1P0 at [redacted]w[redacted]d here for IOL for A2GDM  Labor: s/p Cytotec x3. Repeat Cytotec. Consider Cook's FB at next check. Pitocin, AROM as appropriate Fetal Wellbeing:  Category I Pain Control:  Epidural and IV pain meds at patients request I/D:  GBS negative Anticipated MOD:  Vaginal Delivery, CS as appropriate  Carollee Leitz MD PGY1 Family Med Practice 10/07/2019, 7:36 PM

## 2019-10-07 NOTE — Progress Notes (Signed)
Pt up to bathroom frequently with loose stools since about 35-40 minutes after placement of foley bulb.  Unable to monitor FHR while on toilet and pt refusing to get back in bed due to frequent need to have bowel movement.  Dr. Lia Foyer notified and order given for Immodium.  Waiting on pharmacy to send.  Intermittent FHR able to trace when patient briefly standing to get back to bed before she has to go back to BR.  FHR reassuring in 160s at those times.  Dr. Lia Foyer aware of reason why FHR cannot be traced.

## 2019-10-07 NOTE — Progress Notes (Signed)
Maureen Lewis is a 23 y.o. G1P0000 at [redacted]w[redacted]d admitted for IOL due to [place].  Subjective: Not feeling contractions, feeling baby move. No SOB, RUQ, HA or visual changes.  Objective: BP (!) 128/56   Pulse 100   Temp 97.6 F (36.4 C) (Oral)   Resp 18   Ht 6\' 2"  (1.88 m)   Wt (!) 187.8 kg   LMP 01/21/2019 (Exact Date)   BMI 53.17 kg/m  No intake/output data recorded.  FHT:  FHR: 155 bpm, variability: moderate,  accelerations:  Present,  decelerations:  Absent UC:   irregular, every 10-15 minutes, more uterine irritability  SVE:   Dilation: Closed Effacement (%): Thick Station: -2 Exam by:: Dr. Darene Lamer  Labs: Lab Results  Component Value Date   WBC 7.9 10/07/2019   HGB 10.3 (L) 10/07/2019   HCT 34.2 (L) 10/07/2019   MCV 70.8 (L) 10/07/2019   PLT 239 10/07/2019    Assessment / Plan: 23 y.o. G1P0000 [redacted]w[redacted]d IOL due to gHTN and A2gDM.   #Labor: S/p cyto x2, third about to be given at cervix still closed. Consider FB at next check.  #Pain: epidural upon request #FWB: Cat 1 FHT  #GBS negative #gHTN: no severe range pressures #A2gDM: dose NPH 14 u qhs, Metformin BID. Check BG q4h in latent labor and q2h in active labor. Add SSI if needed. Most recent CBGs 116, 123.  Merilyn Baba DO OB Fellow, Faculty [facility] 10/07/2019, 10:00 AM

## 2019-10-07 NOTE — Progress Notes (Signed)
Labor Progress Note Maureen Lewis is a 23 y.o. G1P0000 at [redacted]w[redacted]d presented for IOL due to gHTN and A2gDM.  S: She is doing well, not requiring pain meds.   O:  BP (!) 104/51   Pulse (!) 101   Temp 98.1 F (36.7 C) (Oral)   Resp 19   Ht 6\' 2"  (1.88 m)   Wt (!) 187.8 kg   LMP 01/21/2019 (Exact Date)   BMI 53.17 kg/m  EFM: 155bpm/mod var/+accels, no decels  CVE: Dilation: Closed Effacement (%): Thick Cervical Position: Posterior Station: Ballotable Presentation: Vertex Exam by:: Consuela Mimes, RN   A&P: 23 y.o. G1P0000 [redacted]w[redacted]d IOL due to gHTN and A2gDM.  #Labor: Progressing appropriately. No cervical change after cytotec x1, will redose.  #Pain: epidural upon request #FWB: Cat 1 FHT  #GBS negative #gHTN:BP well controlled w/o intervention, cont to monitor.  #A2gDM: dose NPH 14 u qhs, Metformin BID. Check BG q4h in latent labor and q2h in active labor. Add SSI if needed  Demetrius Revel, MD 6:05 AM

## 2019-10-07 NOTE — Progress Notes (Signed)
LABOR PROGRESS NOTE  Maureen Lewis is a 23 y.o. G1P0000 at [redacted]w[redacted]d  admitted for IOL for GHTN   Subjective: Patient doing well, no reporting any pain at this time   Objective: BP 132/87   Pulse (!) 102   Temp 98 F (36.7 C) (Oral)   Resp 16   Ht 6\' 2"  (1.88 m)   Wt (!) 187.8 kg   LMP 01/21/2019 (Exact Date)   BMI 53.17 kg/m  or  Vitals:   10/07/19 1500 10/07/19 1544 10/07/19 1650 10/07/19 1810  BP: (!) 143/87 133/75 135/78 132/87  Pulse: (!) 103 93 94 (!) 102  Resp: 18 18 16    Temp:  98 F (36.7 C)    TempSrc:  Oral    Weight:      Height:        FB placed @ 2037  Dilation: Fingertip Effacement (%): 50 Cervical Position: Posterior Station: -3 Presentation: Vertex Exam by:: Dr. Darene Lamer FHT: baseline rate 160, moderate varibility, +accel, no decel Toco: irregular mild contractions   Labs: Lab Results  Component Value Date   WBC 7.9 10/07/2019   HGB 10.3 (L) 10/07/2019   HCT 34.2 (L) 10/07/2019   MCV 70.8 (L) 10/07/2019   PLT 239 10/07/2019    Patient Active Problem List   Diagnosis Date Noted  . Gestational hypertension 09/23/2019  . Migraine headache 07/12/2019  . Alpha thalassemia trait   . Gestational diabetes 05/23/2019  . Supervision of normal first pregnancy 04/06/2019  . Obesity during pregnancy, antepartum 04/06/2019  . Nausea and vomiting during pregnancy 04/06/2019  . Vitamin D deficiency 09/16/2017  . Prediabetes 09/16/2017  . Depression 09/16/2017  . Class 3 severe obesity with serious comorbidity and body mass index (BMI) of 50.0 to 59.9 in adult ([place]) 09/16/2017  . PCOS (polycystic ovarian syndrome) 08/13/2017  . Mass 05/06/2017  . Vertigo 10/06/2016  . Anemia 09/26/2016  . Pseudotumor cerebri syndrome 08/28/2016  . Obesity hypoventilation syndrome ([place]) 08/28/2016  . Benign paroxysmal positional vertigo due to bilateral vestibular disorder 08/28/2016  . OSA (obstructive sleep apnea) 08/28/2016  . Hypersomnia with sleep apnea  08/28/2016  . Female hirsutism 08/28/2016  . Back pain 04/16/2015  . GERD (gastroesophageal reflux disease) 02/24/2015  . Constipation 10/02/2014  . Shoulder pain, left 01/29/2014    Assessment / Plan: 23 y.o. G1P0000 at [redacted]w[redacted]d here for IOL for GHTN   Labor: FB [name]) placed at 2037, continue cytotec for 1 more dose then plan to switch to pitocin  Fetal Wellbeing:  Cat I Pain Control:  Pain medication ordered PRN  Anticipated MOD:  SVD  GHTN: BP stable- no severe range BP  GDM: CBG stable on metformin and insulin, continue to take CBG q4 hours to assess for need of gluco stabilizer   [name], CNM 10/07/2019, 9:33 PM

## 2019-10-07 NOTE — Progress Notes (Signed)
Pt with positive RPR, has previously been negative. T. Pallidum Ab in process.    Feliz Beam, M.D. Attending [facility] [employer] [facility])

## 2019-10-08 ENCOUNTER — Inpatient Hospital Stay (HOSPITAL_COMMUNITY): Payer: BC Managed Care – PPO | Admitting: Anesthesiology

## 2019-10-08 LAB — GLUCOSE, CAPILLARY
Glucose-Capillary: 102 mg/dL — ABNORMAL HIGH (ref 70–99)
Glucose-Capillary: 110 mg/dL — ABNORMAL HIGH (ref 70–99)
Glucose-Capillary: 93 mg/dL (ref 70–99)
Glucose-Capillary: 96 mg/dL (ref 70–99)
Glucose-Capillary: 97 mg/dL (ref 70–99)
Glucose-Capillary: 98 mg/dL (ref 70–99)

## 2019-10-08 LAB — CBC
HCT: 31.6 % — ABNORMAL LOW (ref 36.0–46.0)
Hemoglobin: 9.9 g/dL — ABNORMAL LOW (ref 12.0–15.0)
MCH: 21.8 pg — ABNORMAL LOW (ref 26.0–34.0)
MCHC: 31.3 g/dL (ref 30.0–36.0)
MCV: 69.6 fL — ABNORMAL LOW (ref 80.0–100.0)
Platelets: 209 10*3/uL (ref 150–400)
RBC: 4.54 MIL/uL (ref 3.87–5.11)
RDW: 15.9 % — ABNORMAL HIGH (ref 11.5–15.5)
WBC: 8.9 10*3/uL (ref 4.0–10.5)
nRBC: 0 % (ref 0.0–0.2)

## 2019-10-08 MED ORDER — PHENYLEPHRINE 40 MCG/ML (10ML) SYRINGE FOR IV PUSH (FOR BLOOD PRESSURE SUPPORT): 80.0000 ug | PREFILLED_SYRINGE | INTRAVENOUS | Status: DC | PRN

## 2019-10-08 MED ORDER — LIDOCAINE-EPINEPHRINE (PF) 2 %-1:200000 IJ SOLN
INTRAMUSCULAR | Status: DC | PRN
  Administered 2019-10-08: 2 mL via EPIDURAL

## 2019-10-08 MED ORDER — LACTATED RINGERS IV SOLN: 500.0000 mL | Freq: Once | INTRAVENOUS | Status: DC

## 2019-10-08 MED ORDER — FENTANYL-BUPIVACAINE-NACL 0.5-0.125-0.9 MG/250ML-% EP SOLN
EPIDURAL | Status: AC
  Filled 2019-10-08: qty 250

## 2019-10-08 MED ORDER — DIPHENHYDRAMINE HCL 50 MG/ML IJ SOLN: 12.5000 mg | INTRAMUSCULAR | Status: DC | PRN

## 2019-10-08 MED ORDER — SODIUM CHLORIDE (PF) 0.9 % IJ SOLN
INTRAMUSCULAR | Status: DC | PRN
  Administered 2019-10-08: 12 mL/h via EPIDURAL

## 2019-10-08 MED ORDER — OXYTOCIN 40 UNITS IN NORMAL SALINE INFUSION - SIMPLE MED
1.0000 m[IU]/min | INTRAVENOUS | Status: DC
  Administered 2019-10-08: 2 m[IU]/min via INTRAVENOUS

## 2019-10-08 MED ORDER — EPHEDRINE 5 MG/ML INJ: 10.0000 mg | INTRAVENOUS | Status: DC | PRN

## 2019-10-08 MED ORDER — FENTANYL-BUPIVACAINE-NACL 0.5-0.125-0.9 MG/250ML-% EP SOLN: 12.0000 mL/h | EPIDURAL | Status: DC | PRN

## 2019-10-08 MED ORDER — MISOPROSTOL 25 MCG QUARTER TABLET
25.0000 ug | ORAL_TABLET | Freq: Once | ORAL | Status: AC
  Administered 2019-10-08: 25 ug via VAGINAL
  Filled 2019-10-08: qty 1

## 2019-10-08 MED ORDER — MISOPROSTOL 25 MCG QUARTER TABLET
25.0000 ug | ORAL_TABLET | Freq: Once | ORAL | Status: AC
  Administered 2019-10-08: 10:00:00 25 ug via VAGINAL
  Filled 2019-10-08: qty 1

## 2019-10-08 NOTE — Progress Notes (Signed)
Maureen Lewis is a 23 y.o. G1P0000 at [redacted]w[redacted]d admitted for induction of labor due to Gestational diabetes.  Subjective: Comfortable.   Objective: BP 134/73   Pulse (!) 105   Temp 98.8 F (37.1 C) (Oral)   Resp 18   Ht 6\' 2"  (1.88 m)   Wt (!) 187.8 kg   LMP 01/21/2019 (Exact Date)   BMI 53.17 kg/m  No intake/output data recorded.  FHT:  FHR:145-150  bpm, variability: moderate,  Accelerations present,  decelerations: absent UC:   Minimal on Toco  SVE:   Dilation: Fingertip Effacement (%): 50 Station: -3 Exam by:: Volanda Napoleon, MD   Labs: Lab Results  Component Value Date   WBC 7.9 10/07/2019   HGB 10.3 (L) 10/07/2019   HCT 34.2 (L) 10/07/2019   MCV 70.8 (L) 10/07/2019   PLT 239 10/07/2019    Assessment / Plan: Maureen Lewis is a 23 y.o G1P0 at [redacted]w[redacted]d here for IOL for A2GDM  Labor: s/p Cytotec x7, FB out at 42. Start Pitocin, AROM as appropriate Fetal Wellbeing:  Category I Pain Control:  Epidural and IV pain meds at patients request A2GDM: recent CBG 93, continue Metformin and Insulin.  Monitor CBG q4h I/D:  GBS negative Anticipated MOD:  Vaginal Delivery, CS as appropriate   Carollee Leitz MD PGY1 Family Med Practice 10/08/2019, 7:44 PM

## 2019-10-08 NOTE — Progress Notes (Signed)
Maureen Lewis is a 23 y.o. G1P0000 at [redacted]w[redacted]d admitted for induction of labor due to Gestational diabetes.  Subjective: Comfortable.  Not feeling any contractions.  Objective: BP (!) 141/76   Pulse 95   Temp 98.8 F (37.1 C) (Oral)   Resp 18   Ht 6\' 2"  (1.88 m)   Wt (!) 187.8 kg   LMP 01/21/2019 (Exact Date)   BMI 53.17 kg/m  No intake/output data recorded.  FHT:  FHR: 160 bpm, variability: Moderate,  accelerations: Present,  decelerations: Absent UC:   Minimal on Toco  SVE:   Dilation: Fingertip Effacement (%): 50 Station: -3 Exam by:: Volanda Napoleon, MD   Labs: Lab Results  Component Value Date   WBC 7.9 10/07/2019   HGB 10.3 (L) 10/07/2019   HCT 34.2 (L) 10/07/2019   MCV 70.8 (L) 10/07/2019   PLT 239 10/07/2019    Assessment / Plan: Maureen Lewis is a 23 y.o G1P0 at [redacted]w[redacted]d here for IOL for A2GDM  Labor: s/p Cytotec x7, FB in place. Not much cervical chance. Repeat Cytotec at 1930. Pitocin, AROM as appropriate Fetal Wellbeing:  Category I Pain Control:  Epidural and IV pain meds at patients request A2GDM: recent CBG 97, continue Metformin and Insulin.  Monitor CBG q4h I/D:  GBS negative Anticipated MOD:  Vaginal Delivery, CS as appropriate   Carollee Leitz MD PGY1 Family Med Practice 10/08/2019, 6:05 PM

## 2019-10-08 NOTE — Anesthesia Procedure Notes (Signed)
Epidural Patient location during procedure: OB Start time: 10/08/2019 9:40 PM End time: 10/08/2019 10:00 PM  Staffing Anesthesiologist: Freddrick March, MD Performed: anesthesiologist   Preanesthetic Checklist Completed: patient identified, IV checked, risks and benefits discussed, monitors and equipment checked, pre-op evaluation and timeout performed  Epidural Patient position: sitting Prep: DuraPrep and site prepped and draped Patient monitoring: continuous pulse ox, blood pressure, heart rate and cardiac monitor Approach: midline Location: L3-L4 Injection technique: LOR air  Needle:  Needle type: Tuohy  Needle gauge: 17 G Needle length: 9 cm Needle insertion depth: 10 cm Catheter type: closed end flexible Catheter size: 19 Gauge Catheter at skin depth: 15 cm Test dose: negative  Assessment Sensory level: T8 Events: blood not aspirated, injection not painful, no injection resistance, no paresthesia and negative IV test  Additional Notes Patient identified. Risks/Benefits/Options discussed with patient including but not limited to bleeding, infection, nerve damage, paralysis, failed block, incomplete pain control, headache, blood pressure changes, nausea, vomiting, reactions to medication both or allergic, itching and postpartum back pain. Confirmed with bedside nurse the patient's most recent platelet count. Confirmed with patient that they are not currently taking any anticoagulation, have any bleeding history or any family history of bleeding disorders. Patient expressed understanding and wished to proceed. All questions were answered. Sterile technique was used throughout the entire procedure. Please see nursing notes for vital signs. Test dose was given through epidural catheter and negative prior to continuing to dose epidural or start infusion. Warning signs of high block given to the patient including shortness of breath, tingling/numbness in hands, complete motor  block, or any concerning symptoms with instructions to call for help. Patient was given instructions on fall risk and not to get out of bed. All questions and concerns addressed with instructions to call with any issues or inadequate analgesia.  Reason for block:procedure for pain

## 2019-10-08 NOTE — Progress Notes (Signed)
Labor Progress Note Maureen Lewis is a 23 y.o. G1P0000 at [redacted]w[redacted]d presented for IOL for gHTN S: Was having recurrent n/v and diarrhea, likely due to multiple doses cytotec. Feeling better after imodium x2, zofran x2. Feeling like she can get better rest. No AMS, HA, vision changes, CP, SOB.   O:  BP 109/63   Pulse 92   Temp 97.9 F (36.6 C) (Oral)   Resp 16   Ht 6\' 2"  (1.88 m)   Wt (!) 187.8 kg   LMP 01/21/2019 (Exact Date)   BMI 53.17 kg/m  EFM: 150bpm/mod var/no accels or decels  CVE: Dilation: Fingertip Effacement (%): 50 Cervical Position: Posterior Station: -3 Presentation: Vertex Exam by:: Thea Alken, RN   A&P: 23 y.o. G1P0000 [redacted]w[redacted]d  IOL for gHTN #Labor: Progressing slowly. Cook catheter placed 2037, remains in place. S/p cytotec x4 w/ last dose 1934. Switched to pitocin given multiple side effects, started at 0044, now running at 84mU/min. uptitrate per protocol w/ max dose 4 while cook in place. Anticipate vaginal delivery. #Pain: fentanyl as needed, epidural upon request #FWB: Cat 1 FHT #GBS negative GHTN: BP stable, asymptomatic, cont to monitor A2gDM: CBG stable on metformin and insulin, continue to take CBG q4 hours to assess for need of gluco stabilizer   Demetrius Revel, MD 2:02 AM

## 2019-10-08 NOTE — Anesthesia Preprocedure Evaluation (Addendum)
Anesthesia Evaluation  Patient identified by MRN, date of birth, ID band Patient awake    Reviewed: Allergy & Precautions, NPO status , Patient's Chart, lab work & pertinent test results  Airway Mallampati: III  TM Distance: >3 FB Neck ROM: Full    Dental no notable dental hx. (+) Teeth Intact, Dental Advisory Given   Pulmonary sleep apnea (does not use CPAP) ,    Pulmonary exam normal breath sounds clear to auscultation       Cardiovascular hypertension (PIH), Normal cardiovascular exam Rhythm:Regular Rate:Normal     Neuro/Psych  Headaches,    GI/Hepatic GERD  ,  Endo/Other  diabetesMorbid obesityPCOS  Renal/GU      Musculoskeletal   Abdominal   Peds  Hematology  (+) Blood dyscrasia (Hgb 9.9), anemia ,   Anesthesia Other Findings   Reproductive/Obstetrics (+) Pregnancy                            Anesthesia Physical Anesthesia Plan  ASA: III  Anesthesia Plan: Epidural   Post-op Pain Management:    Induction:   PONV Risk Score and Plan: Treatment may vary due to age or medical condition  Airway Management Planned: Natural Airway  Additional Equipment:   Intra-op Plan:   Post-operative Plan:   Informed Consent: I have reviewed the patients History and Physical, chart, labs and discussed the procedure including the risks, benefits and alternatives for the proposed anesthesia with the patient or authorized representative who has indicated his/her understanding and acceptance.       Plan Discussed with: Anesthesiologist  Anesthesia Plan Comments: (Pt had a previous bad experience with an LP, where she states she could feel everything despite the use of local anesthetic. She also reports experiencing lower extremity weakness/numbness for one week after the procedure, although there is no documentation of this in the chart and she did not require surgery or hospitalization. There  is documentation of an attempted LP with fluoroscopy on 07/17/17 which failed due to BMI and patient discomfort per the procedure note. Lumbar plain films are normal. I explained to her that epidurals are a blind procedure and so while we attempt to numb the area well, it is always possible to experience some discomfort during the procedure. I also explained that she would be in the sitting position for the epidural (she remembers being lateral for the LP) and that this sometimes increases success and decreases discomfort. I encouraged her to request her epidural earlier rather than later in the course of her labor to decrease difficulty with placement.)       Anesthesia Quick Evaluation

## 2019-10-08 NOTE — Progress Notes (Signed)
Labor Progress Note Maureen Lewis is a 23 y.o. G1P0000 at [redacted]w[redacted]d presented for IOL gHTN  S:  Patient comfortable, not feeling contractions  O:  BP (!) 145/46   Pulse 95   Temp 98.5 F (36.9 C) (Oral)   Resp 18   Ht 6\' 2"  (1.88 m)   Wt (!) 187.8 kg   LMP 01/21/2019 (Exact Date)   BMI 53.17 kg/m   Fetal Tracing:  Baseline: 150 Variability: moderate Accels: 15x15 Decels: none  Toco: none   CVE: Dilation: Fingertip Effacement (%): 50 Cervical Position: Posterior Station: -3 Presentation: Vertex Exam by:: Milta Deiters, CNM   A&P: 23 y.o. G1P0000 [redacted]w[redacted]d IOL gHTN #Labor: FB still in place. Not feeling any cramping/contractions. Discussed another dose of cyotec instead of pitocin and patient agreeable with plan of care. cytotec placed vaginally. Cervix palpates thin with bloody show. #Pain: per patient request #FWB: Cat 1 #GBS negative  Wende Mott, CNM 10:09 AM

## 2019-10-08 NOTE — Progress Notes (Signed)
Maureen Lewis is a 23 y.o. G1P0000 at [redacted]w[redacted]d admitted for induction of labor due to Gestational diabetes.  Subjective: Comfortable.  No not feeling contractions.  Objective: BP 140/75   Pulse (!) 109   Temp 99.2 F (37.3 C) (Oral)   Resp 18   Ht 6\' 2"  (1.88 m)   Wt (!) 187.8 kg   LMP 01/21/2019 (Exact Date)   BMI 53.17 kg/m  No intake/output data recorded.  FHT:  FHR: 160 bpm, variability: moderate,  accelerations:  Present,  decelerations:  Absent UC:   Minimal on Toco  SVE:   Dilation: Fingertip Effacement (%): 50 Station: -3 Exam by:: Milta Deiters, CNM   Labs: Lab Results  Component Value Date   WBC 7.9 10/07/2019   HGB 10.3 (L) 10/07/2019   HCT 34.2 (L) 10/07/2019   MCV 70.8 (L) 10/07/2019   PLT 239 10/07/2019    Assessment / Plan: Maureen Lewis is a 23 y.o G1P0 at [redacted]w[redacted]d here for IOL for A2GDM  Labor: s/p Cytotec x6, FB in place. Repeat Cytotec. Pitocin, AROM as appropriate Fetal Wellbeing:  Category I Pain Control:  Epidural and IV pain meds at patients request A2GDM: recent CBG 97, continue Metformin and Insulin.  Monitor CBG q4h I/D:  GBS negative Anticipated MOD:  Vaginal Delivery, CS as appropriate   Carollee Leitz MD PGY1 Family Med Practice 10/08/2019, 2:09 PM

## 2019-10-09 ENCOUNTER — Encounter (HOSPITAL_COMMUNITY)
Admission: AD | Disposition: A | Discharge status: Home / Self Care | Payer: Self-pay | Source: Home / Self Care | Attending: Obstetrics and Gynecology

## 2019-10-09 ENCOUNTER — Encounter: Payer: Self-pay | Admitting: Obstetrics and Gynecology

## 2019-10-09 DIAGNOSIS — A530 Latent syphilis, unspecified as early or late: Secondary | ICD-10-CM

## 2019-10-09 DIAGNOSIS — O24424 Gestational diabetes mellitus in childbirth, insulin controlled: Secondary | ICD-10-CM

## 2019-10-09 DIAGNOSIS — Z3A37 37 weeks gestation of pregnancy: Secondary | ICD-10-CM

## 2019-10-09 DIAGNOSIS — O134 Gestational [pregnancy-induced] hypertension without significant proteinuria, complicating childbirth: Secondary | ICD-10-CM

## 2019-10-09 LAB — COMPREHENSIVE METABOLIC PANEL
ALT: 16 U/L (ref 0–44)
ALT: 15 U/L (ref 0–44)
AST: 16 U/L (ref 15–41)
AST: 23 U/L (ref 15–41)
Albumin: 2.2 g/dL — ABNORMAL LOW (ref 3.5–5.0)
Albumin: 2.1 g/dL — ABNORMAL LOW (ref 3.5–5.0)
Alkaline Phosphatase: 93 U/L (ref 38–126)
Alkaline Phosphatase: 88 U/L (ref 38–126)
Anion gap: 8 (ref 5–15)
Anion gap: 12 (ref 5–15)
BUN: 5 mg/dL — ABNORMAL LOW (ref 6–20)
BUN: 6 mg/dL (ref 6–20)
CO2: 20 mmol/L — ABNORMAL LOW (ref 22–32)
CO2: 16 mmol/L — ABNORMAL LOW (ref 22–32)
Calcium: 8.6 mg/dL — ABNORMAL LOW (ref 8.9–10.3)
Calcium: 8.6 mg/dL — ABNORMAL LOW (ref 8.9–10.3)
Chloride: 107 mmol/L (ref 98–111)
Chloride: 108 mmol/L (ref 98–111)
Creatinine, Ser: 0.48 mg/dL (ref 0.44–1.00)
Creatinine, Ser: 0.55 mg/dL (ref 0.44–1.00)
GFR calc Af Amer: >60 mL/min (ref >60)
GFR calc Af Amer: >60 mL/min (ref >60)
GFR calc non Af Amer: >60 mL/min (ref >60)
GFR calc non Af Amer: >60 mL/min (ref >60)
Glucose, Bld: 90 mg/dL (ref 70–99)
Glucose, Bld: 96 mg/dL (ref 70–99)
Potassium: 3.7 mmol/L (ref 3.5–5.1)
Potassium: 5.4 mmol/L — ABNORMAL HIGH (ref 3.5–5.1)
Sodium: 135 mmol/L (ref 135–145)
Sodium: 136 mmol/L (ref 135–145)
Total Bilirubin: 0.5 mg/dL (ref 0.3–1.2)
Total Bilirubin: 0.5 mg/dL (ref 0.3–1.2)
Total Protein: 5.7 g/dL — ABNORMAL LOW (ref 6.5–8.1)
Total Protein: 5.5 g/dL — ABNORMAL LOW (ref 6.5–8.1)

## 2019-10-09 LAB — CBC
HCT: 30.2 % — ABNORMAL LOW (ref 36.0–46.0)
HCT: 31.4 % — ABNORMAL LOW (ref 36.0–46.0)
Hemoglobin: 9.4 g/dL — ABNORMAL LOW (ref 12.0–15.0)
Hemoglobin: 10 g/dL — ABNORMAL LOW (ref 12.0–15.0)
MCH: 21.6 pg — ABNORMAL LOW (ref 26.0–34.0)
MCH: 21.9 pg — ABNORMAL LOW (ref 26.0–34.0)
MCHC: 31.1 g/dL (ref 30.0–36.0)
MCHC: 31.8 g/dL (ref 30.0–36.0)
MCV: 69.3 fL — ABNORMAL LOW (ref 80.0–100.0)
MCV: 68.9 fL — ABNORMAL LOW (ref 80.0–100.0)
Platelets: 175 10*3/uL (ref 150–400)
Platelets: 179 10*3/uL (ref 150–400)
RBC: 4.36 MIL/uL (ref 3.87–5.11)
RBC: 4.56 MIL/uL (ref 3.87–5.11)
RDW: 15.7 % — ABNORMAL HIGH (ref 11.5–15.5)
RDW: 15.6 % — ABNORMAL HIGH (ref 11.5–15.5)
WBC: 7.6 10*3/uL (ref 4.0–10.5)
WBC: 12.5 10*3/uL — ABNORMAL HIGH (ref 4.0–10.5)
nRBC: 0 % (ref 0.0–0.2)
nRBC: 0 % (ref 0.0–0.2)

## 2019-10-09 LAB — GLUCOSE, CAPILLARY
Glucose-Capillary: 75 mg/dL (ref 70–99)
Glucose-Capillary: 79 mg/dL (ref 70–99)
Glucose-Capillary: 82 mg/dL (ref 70–99)
Glucose-Capillary: 82 mg/dL (ref 70–99)
Glucose-Capillary: 93 mg/dL (ref 70–99)
Glucose-Capillary: 95 mg/dL (ref 70–99)

## 2019-10-09 LAB — T.PALLIDUM AB, TOTAL: T Pallidum Abs: NONREACTIVE

## 2019-10-09 SURGERY — Surgical Case
Anesthesia: Epidural | Site: Abdomen | Wound class: Clean Contaminated

## 2019-10-09 MED ORDER — ONDANSETRON HCL 4 MG/2ML IJ SOLN: 4.0000 mg | Freq: Three times a day (TID) | INTRAMUSCULAR | Status: DC | PRN

## 2019-10-09 MED ORDER — NALBUPHINE HCL 10 MG/ML IJ SOLN: 5.0000 mg | INTRAMUSCULAR | Status: DC | PRN

## 2019-10-09 MED ORDER — FENTANYL CITRATE (PF) 100 MCG/2ML IJ SOLN
25.0000 ug | INTRAMUSCULAR | Status: DC | PRN
  Administered 2019-10-09 (×4): 50 ug via INTRAVENOUS

## 2019-10-09 MED ORDER — NALOXONE HCL 0.4 MG/ML IJ SOLN: 0.4000 mg | INTRAMUSCULAR | Status: DC | PRN

## 2019-10-09 MED ORDER — NALOXONE HCL 4 MG/10ML IJ SOLN
1.0000 ug/kg/h | INTRAVENOUS | Status: DC | PRN
  Filled 2019-10-09: qty 5

## 2019-10-09 MED ORDER — DIPHENHYDRAMINE HCL 25 MG PO CAPS: 25.0000 mg | ORAL_CAPSULE | Freq: Four times a day (QID) | ORAL | Status: DC | PRN

## 2019-10-09 MED ORDER — AMLODIPINE BESYLATE 5 MG PO TABS
10.0000 mg | ORAL_TABLET | Freq: Every day | ORAL | Status: DC
  Administered 2019-10-10 – 2019-10-11 (×2): 10 mg via ORAL
  Filled 2019-10-09: qty 2
  Filled 2019-10-09: qty 1
  Filled 2019-10-09: qty 2

## 2019-10-09 MED ORDER — SCOPOLAMINE 1 MG/3DAYS TD PT72
MEDICATED_PATCH | TRANSDERMAL | Status: AC
  Filled 2019-10-09: qty 1

## 2019-10-09 MED ORDER — WITCH HAZEL-GLYCERIN EX PADS: 1.0000 "application " | MEDICATED_PAD | CUTANEOUS | Status: DC | PRN

## 2019-10-09 MED ORDER — ONDANSETRON HCL 4 MG/2ML IJ SOLN: 4.0000 mg | Freq: Once | INTRAMUSCULAR | Status: DC | PRN

## 2019-10-09 MED ORDER — FENTANYL CITRATE (PF) 100 MCG/2ML IJ SOLN
50.0000 ug | Freq: Once | INTRAMUSCULAR | Status: AC
  Administered 2019-10-09: 21:00:00 50 ug via INTRAVENOUS
  Filled 2019-10-09: qty 2

## 2019-10-09 MED ORDER — PRENATAL MULTIVITAMIN CH
1.0000 | ORAL_TABLET | Freq: Every day | ORAL | Status: DC
  Administered 2019-10-10 – 2019-10-11 (×2): 1 via ORAL
  Filled 2019-10-09 (×3): qty 1

## 2019-10-09 MED ORDER — MEPERIDINE HCL 25 MG/ML IJ SOLN: 6.2500 mg | INTRAMUSCULAR | Status: DC | PRN

## 2019-10-09 MED ORDER — HYDRALAZINE HCL 20 MG/ML IJ SOLN: 10.0000 mg | INTRAMUSCULAR | Status: DC | PRN

## 2019-10-09 MED ORDER — ONDANSETRON HCL 4 MG/2ML IJ SOLN
INTRAMUSCULAR | Status: DC | PRN
  Administered 2019-10-09 (×2): 4 mg via INTRAVENOUS

## 2019-10-09 MED ORDER — SODIUM CHLORIDE 0.9 % IR SOLN
Status: DC | PRN
  Administered 2019-10-09: 1000 mL

## 2019-10-09 MED ORDER — OXYTOCIN 40 UNITS IN NORMAL SALINE INFUSION - SIMPLE MED: 2.5000 [IU]/h | INTRAVENOUS | Status: AC

## 2019-10-09 MED ORDER — SODIUM CHLORIDE 0.9% FLUSH: 3.0000 mL | INTRAVENOUS | Status: DC | PRN

## 2019-10-09 MED ORDER — OXYCODONE HCL 5 MG PO TABS: 5.0000 mg | ORAL_TABLET | Freq: Four times a day (QID) | ORAL | Status: DC | PRN

## 2019-10-09 MED ORDER — SODIUM CHLORIDE 0.9 % IV SOLN
500.0000 mg | Freq: Once | INTRAVENOUS | Status: AC
  Administered 2019-10-09: 500 mg via INTRAVENOUS

## 2019-10-09 MED ORDER — TETANUS-DIPHTH-ACELL PERTUSSIS 5-2.5-18.5 LF-MCG/0.5 IM SUSP
0.5000 mL | Freq: Once | INTRAMUSCULAR | Status: DC
  Filled 2019-10-09: qty 0.5

## 2019-10-09 MED ORDER — LABETALOL HCL 5 MG/ML IV SOLN: 80.0000 mg | INTRAVENOUS | Status: DC | PRN

## 2019-10-09 MED ORDER — SIMETHICONE 80 MG PO CHEW
80.0000 mg | CHEWABLE_TABLET | Freq: Three times a day (TID) | ORAL | Status: DC
  Administered 2019-10-10 – 2019-10-11 (×5): 80 mg via ORAL
  Filled 2019-10-09 (×6): qty 1

## 2019-10-09 MED ORDER — DIPHENHYDRAMINE HCL 50 MG/ML IJ SOLN: 12.5000 mg | INTRAMUSCULAR | Status: DC | PRN

## 2019-10-09 MED ORDER — COCONUT OIL OIL
1.0000 "application " | TOPICAL_OIL | Status: DC | PRN
  Filled 2019-10-09: qty 120

## 2019-10-09 MED ORDER — NALBUPHINE HCL 10 MG/ML IJ SOLN: 5.0000 mg | Freq: Once | INTRAMUSCULAR | Status: DC | PRN

## 2019-10-09 MED ORDER — LACTATED RINGERS IV SOLN: INTRAVENOUS | Status: DC | PRN

## 2019-10-09 MED ORDER — OXYTOCIN 40 UNITS IN NORMAL SALINE INFUSION - SIMPLE MED
INTRAVENOUS | Status: DC | PRN
  Administered 2019-10-09: 40 mL via INTRAVENOUS

## 2019-10-09 MED ORDER — FENTANYL CITRATE (PF) 100 MCG/2ML IJ SOLN
INTRAMUSCULAR | Status: AC
  Filled 2019-10-09: qty 2

## 2019-10-09 MED ORDER — TRANEXAMIC ACID 1000 MG/10ML IV SOLN
INTRAVENOUS | Status: DC | PRN
  Administered 2019-10-09: 1000 mg via INTRAVENOUS

## 2019-10-09 MED ORDER — LABETALOL HCL 5 MG/ML IV SOLN
INTRAVENOUS | Status: AC
  Filled 2019-10-09: qty 4

## 2019-10-09 MED ORDER — OXYCODONE HCL 5 MG/5ML PO SOLN: 5.0000 mg | Freq: Once | ORAL | Status: DC | PRN

## 2019-10-09 MED ORDER — FENTANYL CITRATE (PF) 100 MCG/2ML IJ SOLN
INTRAMUSCULAR | Status: DC | PRN
  Administered 2019-10-09: 100 ug via INTRAVENOUS

## 2019-10-09 MED ORDER — ENOXAPARIN SODIUM 100 MG/ML ~~LOC~~ SOLN
90.0000 mg | SUBCUTANEOUS | Status: DC
  Administered 2019-10-10: 90 mg via SUBCUTANEOUS
  Filled 2019-10-09 (×2): qty 0.9

## 2019-10-09 MED ORDER — DIBUCAINE (PERIANAL) 1 % EX OINT
1.0000 "application " | TOPICAL_OINTMENT | CUTANEOUS | Status: DC | PRN
  Filled 2019-10-09: qty 28

## 2019-10-09 MED ORDER — LABETALOL HCL 5 MG/ML IV SOLN
20.0000 mg | INTRAVENOUS | Status: DC | PRN
  Administered 2019-10-09: 20 mg via INTRAVENOUS

## 2019-10-09 MED ORDER — ONDANSETRON HCL 4 MG/2ML IJ SOLN
INTRAMUSCULAR | Status: AC
  Filled 2019-10-09: qty 2

## 2019-10-09 MED ORDER — SCOPOLAMINE 1 MG/3DAYS TD PT72
1.0000 | MEDICATED_PATCH | Freq: Once | TRANSDERMAL | Status: DC
  Administered 2019-10-09: 1.5 mg via TRANSDERMAL

## 2019-10-09 MED ORDER — DIPHENHYDRAMINE HCL 25 MG PO CAPS: 25.0000 mg | ORAL_CAPSULE | ORAL | Status: DC | PRN

## 2019-10-09 MED ORDER — LACTATED RINGERS IV SOLN: INTRAVENOUS | Status: DC

## 2019-10-09 MED ORDER — MENTHOL 3 MG MT LOZG
1.0000 | LOZENGE | OROMUCOSAL | Status: DC | PRN
  Filled 2019-10-09: qty 9

## 2019-10-09 MED ORDER — SCOPOLAMINE 1 MG/3DAYS TD PT72
MEDICATED_PATCH | TRANSDERMAL | Status: DC | PRN
  Administered 2019-10-09: 1 via TRANSDERMAL

## 2019-10-09 MED ORDER — POLYETHYLENE GLYCOL 3350 17 G PO PACK
17.0000 g | PACK | Freq: Every day | ORAL | Status: DC
  Administered 2019-10-10 – 2019-10-11 (×2): 17 g via ORAL
  Filled 2019-10-09 (×3): qty 1

## 2019-10-09 MED ORDER — STERILE WATER FOR IRRIGATION IR SOLN
Status: DC | PRN
  Administered 2019-10-09: 1000 mL

## 2019-10-09 MED ORDER — SODIUM CHLORIDE 0.9 % IV SOLN
INTRAVENOUS | Status: AC
  Filled 2019-10-09: qty 500

## 2019-10-09 MED ORDER — SODIUM CHLORIDE 0.9 % IV SOLN: INTRAVENOUS | Status: DC | PRN

## 2019-10-09 MED ORDER — SODIUM BICARBONATE 8.4 % IV SOLN
INTRAVENOUS | Status: DC | PRN
  Administered 2019-10-09 (×2): 5 mL via EPIDURAL

## 2019-10-09 MED ORDER — OXYCODONE HCL 5 MG PO TABS: 5.0000 mg | ORAL_TABLET | Freq: Once | ORAL | Status: DC | PRN

## 2019-10-09 MED ORDER — SOD CITRATE-CITRIC ACID 500-334 MG/5ML PO SOLN: 30.0000 mL | ORAL | Status: DC

## 2019-10-09 MED ORDER — ACETAMINOPHEN 325 MG PO TABS
650.0000 mg | ORAL_TABLET | ORAL | Status: DC | PRN
  Filled 2019-10-09: qty 2

## 2019-10-09 MED ORDER — LABETALOL HCL 5 MG/ML IV SOLN: 40.0000 mg | INTRAVENOUS | Status: DC | PRN

## 2019-10-09 MED ORDER — TRANEXAMIC ACID-NACL 1000-0.7 MG/100ML-% IV SOLN: 1000.0000 mg | INTRAVENOUS | Status: DC

## 2019-10-09 MED ORDER — ZOLPIDEM TARTRATE 5 MG PO TABS: 5.0000 mg | ORAL_TABLET | Freq: Every evening | ORAL | Status: DC | PRN

## 2019-10-09 MED ORDER — IBUPROFEN 800 MG PO TABS
800.0000 mg | ORAL_TABLET | Freq: Three times a day (TID) | ORAL | Status: DC
  Administered 2019-10-10 – 2019-10-11 (×4): 800 mg via ORAL
  Filled 2019-10-09 (×6): qty 1

## 2019-10-09 MED ORDER — KETOROLAC TROMETHAMINE 30 MG/ML IJ SOLN: 30.0000 mg | Freq: Once | INTRAMUSCULAR | Status: DC | PRN

## 2019-10-09 MED ORDER — DEXTROSE 5 % IV SOLN
3.0000 g | INTRAVENOUS | Status: AC
  Administered 2019-10-09: 3 g via INTRAVENOUS
  Filled 2019-10-09: qty 3000

## 2019-10-09 SURGICAL SUPPLY — 37 items
BENZOIN TINCTURE PRP APPL 2/3 (GAUZE/BANDAGES/DRESSINGS) ×2 IMPLANT
CANISTER SUCT 3000ML PPV (MISCELLANEOUS) ×2 IMPLANT
CHLORAPREP W/TINT 26ML (MISCELLANEOUS) ×2 IMPLANT
ELECT REM PT RETURN 9FT ADLT (ELECTROSURGICAL) ×2
ELECTRODE REM PT RTRN 9FT ADLT (ELECTROSURGICAL) ×1 IMPLANT
EXTENDER TRAXI PANNICULUS (MISCELLANEOUS) ×1 IMPLANT
EXTRACTOR VACUUM KIWI (MISCELLANEOUS) ×2 IMPLANT
GLOVE BIOGEL PI IND STRL 7.0 (GLOVE) ×2 IMPLANT
GLOVE BIOGEL PI IND STRL 7.5 (GLOVE) ×1 IMPLANT
GLOVE BIOGEL PI INDICATOR 7.0 (GLOVE) ×2
GLOVE BIOGEL PI INDICATOR 7.5 (GLOVE) ×1
GLOVE SKINSENSE NS SZ7.0 (GLOVE) ×1
GLOVE SKINSENSE STRL SZ7.0 (GLOVE) ×1 IMPLANT
GOWN STRL REUS W/ TWL LRG LVL3 (GOWN DISPOSABLE) ×2 IMPLANT
GOWN STRL REUS W/ TWL XL LVL3 (GOWN DISPOSABLE) ×1 IMPLANT
GOWN STRL REUS W/TWL LRG LVL3 (GOWN DISPOSABLE) ×2
GOWN STRL REUS W/TWL XL LVL3 (GOWN DISPOSABLE) ×1
HOVERMATT SINGLE USE (MISCELLANEOUS) ×2 IMPLANT
KIT PREVENA INCISION MGT20CM45 (CANNISTER) ×2 IMPLANT
NS IRRIG 1000ML POUR BTL (IV SOLUTION) ×2 IMPLANT
PACK C SECTION WH (CUSTOM PROCEDURE TRAY) ×2 IMPLANT
PAD ABD 7.5X8 STRL (GAUZE/BANDAGES/DRESSINGS) ×2 IMPLANT
PAD OB MATERNITY 4.3X12.25 (PERSONAL CARE ITEMS) ×2 IMPLANT
PAD PREP 24X48 CUFFED NSTRL (MISCELLANEOUS) ×2 IMPLANT
PENCIL SMOKE EVAC W/HOLSTER (ELECTROSURGICAL) ×2 IMPLANT
RETRACTOR TRAXI PANNICULUS (MISCELLANEOUS) ×1 IMPLANT
SUT MNCRL 0 VIOLET CTX 36 (SUTURE) ×2 IMPLANT
SUT MON AB 4-0 PS1 27 (SUTURE) ×2 IMPLANT
SUT MONOCRYL 0 CTX 36 (SUTURE) ×2
SUT PLAIN 2 0 XLH (SUTURE) ×2 IMPLANT
SUT VIC AB 0 CT1 36 (SUTURE) ×4 IMPLANT
SUT VIC AB 3-0 CT1 27 (SUTURE) ×2
SUT VIC AB 3-0 CT1 TAPERPNT 27 (SUTURE) ×1 IMPLANT
TOWEL OR 17X24 6PK STRL BLUE (TOWEL DISPOSABLE) ×4 IMPLANT
TRAXI PANNICULUS EXTENDER (MISCELLANEOUS) ×1
TRAXI PANNICULUS RETRACTOR (MISCELLANEOUS) ×1
WATER STERILE IRR 1000ML POUR (IV SOLUTION) ×2 IMPLANT

## 2019-10-09 NOTE — Progress Notes (Signed)
L&D Note  10/09/2019 - 12:01 PM  23 y.o. G1P0000 [redacted]w[redacted]d. Pregnancy complicated by  Patient Active Problem List   Diagnosis Date Noted  . Gestational hypertension 09/23/2019  . Migraine headache 07/12/2019  . Alpha thalassemia trait   . Gestational diabetes 05/23/2019  . Supervision of normal first pregnancy 04/06/2019  . Obesity during pregnancy, antepartum 04/06/2019  . Nausea and vomiting during pregnancy 04/06/2019  . Vitamin D deficiency 09/16/2017  . Prediabetes 09/16/2017  . Depression 09/16/2017  . Class 3 severe obesity with serious comorbidity and body mass index (BMI) of 50.0 to 59.9 in adult ([place]) 09/16/2017  . PCOS (polycystic ovarian syndrome) 08/13/2017  . Mass 05/06/2017  . Vertigo 10/06/2016  . Anemia 09/26/2016  . Pseudotumor cerebri syndrome 08/28/2016  . Obesity hypoventilation syndrome ([place]) 08/28/2016  . Benign paroxysmal positional vertigo due to bilateral vestibular disorder 08/28/2016  . OSA (obstructive sleep apnea) 08/28/2016  . Hypersomnia with sleep apnea 08/28/2016  . Female hirsutism 08/28/2016  . Back pain 04/16/2015  . GERD (gastroesophageal reflux disease) 02/24/2015  . Constipation 10/02/2014  . Shoulder pain, left 01/29/2014    [name] is admitted for IOL for gHTN just past midnight on 12/18   Subjective:  Pt not feeling any UCs with epidural in place. No s/s of pre-eclampsia  Objective:   Vitals:   10/09/19 1030 10/09/19 1033 10/09/19 1100 10/09/19 1130  BP: (!) 175/82 136/79 129/74 (!) 130/96  Pulse: 95 87 74 100  Resp: 18 18 18 18   Temp: 98.5 F (36.9 C)     TempSrc: Oral     Weight:      Height:        Current Vital Signs 24h Vital Sign Ranges  T 98.5 F (36.9 C) Temp  Avg: 98.4 F (36.9 C)  Min: 97.7 F (36.5 C)  Max: 99.2 F (37.3 C)  BP (!) 130/96 BP  Min: 100/79  Max: 175/82  HR 100 Pulse  Avg: 92.6  Min: 74  Max: 109  RR 18 Resp  Avg: 17.9  Min: 17  Max: 20  SaO2     No data recorded       24 Hour I/O  Current Shift I/O  Time Ins Outs 12/19 0701 - 12/20 0700 In: -  Out: 525 [Urine:525] No intake/output data recorded.   FHR: 155 baseline, +accels, no decel, mod variabiligy Toco: q2-81m Gen: NAD SVE: 2-3/50/high/cephalic. IUPC in place  Labs:  Recent Labs  Lab 10/07/19 0008 10/08/19 2026 10/09/19 0849  WBC 7.9 8.9 7.6  HGB 10.3* 9.9* 9.4*  HCT 34.2* 31.6* 30.2*  PLT 239 209 175   Recent Labs  Lab 10/07/19 0008 10/09/19 0849  NA 137 135  K 4.0 3.7  CL 108 107  CO2 20* 20*  BUN 8 5*  CREATININE 0.47 0.48  CALCIUM 8.8* 8.6*  PROT 6.5 5.7*  BILITOT 0.5 0.5  ALKPHOS 112 93  ALT 14 16  AST 15 16  GLUCOSE 100* 90   Results for [name], [name] (MRN [id number]) as of 10/09/2019 12:19  Ref. Range 10/08/2019 20:32 10/09/2019 00:31 10/09/2019 04:36 10/09/2019 08:18  Glucose-Capillary Latest Ref Range: 70 - 99 mg/dL 98 95 93 79    Medications Current Facility-Administered Medications  Medication Dose Route Frequency Provider Last Rate Last Admin  . acetaminophen (TYLENOL) tablet 650 mg  650 mg Oral Q4H PRN Sparacino, Hailey L, DO   650 mg at 10/09/19 0814  . diphenhydrAMINE (BENADRYL) injection 12.5 mg  12.5 mg  Intravenous Q15 min PRN Lidia Collum, MD      . ePHEDrine injection 10 mg  10 mg Intravenous PRN Lidia Collum, MD      . ePHEDrine injection 10 mg  10 mg Intravenous PRN Lidia Collum, MD      . fentaNYL (SUBLIMAZE) injection 100 mcg  100 mcg Intravenous Q1H PRN Laury Deep, CNM   100 mcg at 10/08/19 0048  . fentaNYL 2 mcg/mL w/ bupivacaine 0.125% in NS 250 mL epidural infusion ([facility])  12 mL/hr Epidural Continuous PRN Lidia Collum, MD      . hydrOXYzine (ATARAX/VISTARIL) tablet 50 mg  50 mg Oral Q6H PRN Laury Deep, CNM      . insulin NPH Human (NOVOLIN N) injection 14 Units  14 Units Subcutaneous QHS Christin Fudge, CNM   14 Units at 10/08/19 2302  . lactated ringers infusion 500 mL  500 mL Intravenous Once [name] Ha E,  MD      . lactated ringers infusion 500-1,000 mL  500-1,000 mL Intravenous PRN Laury Deep, CNM      . lactated ringers infusion   Intravenous Continuous Laury Deep, CNM 125 mL/hr at 10/09/19 [id number] New Bag at 10/09/19 0834  . lidocaine (PF) (XYLOCAINE) 1 % injection 30 mL  30 mL Subcutaneous PRN Laury Deep, CNM      . loperamide (IMODIUM) capsule 2 mg  2 mg Oral PRN Demetrius Revel, MD   2 mg at 10/07/19 2216  . loperamide (IMODIUM) capsule 2 mg  2 mg Oral Once Sharene Butters D, MD      . metFORMIN (GLUCOPHAGE-XR) 24 hr tablet 1,000 mg  1,000 mg Oral BID WC Cresenzo-Dishmon, Joaquim Lai, CNM   1,000 mg at 10/09/19 0920  . ondansetron (ZOFRAN) injection 4 mg  4 mg Intravenous Q6H PRN Laury Deep, CNM   4 mg at 10/08/19 0052  . oxytocin (PITOCIN) IV BOLUS FROM BAG  500 mL Intravenous Once Laury Deep, CNM      . oxytocin (PITOCIN) IV infusion 40 units in NS 1000 mL - Premix  2.5 Units/hr Intravenous Continuous Laury Deep, CNM      . oxytocin (PITOCIN) IV infusion 40 units in NS 1000 mL - Premix  1-40 milli-units/min Intravenous Titrated Wende Mott, CNM 45 mL/hr at 10/09/19 1127 30 milli-units/min at 10/09/19 1127  . PHENYLephrine 40 mcg/ml in normal saline Adult IV Push Syringe  80 mcg Intravenous PRN Lidia Collum, MD      . PHENYLephrine 40 mcg/ml in normal saline Adult IV Push Syringe  80 mcg Intravenous PRN Lidia Collum, MD      . terbutaline (BRETHINE) injection 0.25 mg  0.25 mg Subcutaneous Once PRN Laury Deep, CNM      . zolpidem (AMBIEN) tablet 5 mg  5 mg Oral QHS PRN Cresenzo-Dishmon, Joaquim Lai, CNM   5 mg at 10/07/19 0201   Facility-Administered Medications Ordered in Other Encounters  Medication Dose Route Frequency Provider Last Rate Last Admin  . fentaNYL (SUBLIMAZE) 500 mcg, bupivacaine (SENSORCAINE-MPF) 41.67 mL in sodium chloride (PF) 0.9 % 250 mL epidural   Epidural Continuous PRN Hulan Fray L, MD 12 mL/hr at 10/08/19 2157 12 mL/hr at  10/08/19 2157  . lidocaine-EPINEPHrine (XYLOCAINE W/EPI) 2 %-1:200000 (PF) injection   Epidural Anesthesia Intra-op Hulan Fray L, MD   2 mL at 10/08/19 2154    Assessment & Plan:  Pt stable *IUP: category I tracing *IOL: s/p AROM at 0100 on 12/20 and on pitocin the  entire time and currently at 30 of pitocin. Cx unchanged. D/w her and I told her I recommend rpt exam in about 6 hours and if unchanged at that time I told her I'd recommend c/s for failed IOL given it will be close to 18 hours s/p ROM on pitocin. Will make NPO.  *gHTN: normal labs this morning. Currently on no meds *GDMa2: normal CBGs.  *GBS: neg *Analgesia: epidural working well  Durene Romans. MD Attending Center for [facility])

## 2019-10-09 NOTE — Progress Notes (Signed)
LABOR PROGRESS NOTE  Maureen Lewis is a 23 y.o. G1P0000 at [redacted]w[redacted]d  admitted for IOL for GHTN - patient induction process started 12/18 at midnight.   Subjective: Patient comfortable with epidural at this time. Denies feeling any contractions or pressure   Objective: BP (!) 146/75   Pulse 94   Temp 98.1 F (36.7 C) (Oral)   Resp 18   Ht 6\' 2"  (1.88 m)   Wt (!) 187.8 kg   LMP 01/21/2019 (Exact Date)   BMI 53.17 kg/m  or  Vitals:   10/08/19 2331 10/09/19 0000 10/09/19 0030 10/09/19 0100  BP: (!) 117/55 (!) 114/51 132/61 (!) 146/75  Pulse: 99 94 100 94  Resp: 18 18 17 18   Temp:      TempSrc:      Weight:      Height:        Pitocin currently at 8 milli-units/min - last turned up around 0030.  Cervix last evaluated around 2245 AROM @0058 - clear fluid, moderate amount  Dilation: 3 Effacement (%): 60 Cervical Position: Posterior Station: -2 Presentation: Vertex Exam by:: [place], CNM FHT: baseline rate 155, moderate varibility, +accel, no decel Toco: 3-4 minutes by palpation/ mild   Labs: Lab Results  Component Value Date   WBC 8.9 10/08/2019   HGB 9.9 (L) 10/08/2019   HCT 31.6 (L) 10/08/2019   MCV 69.6 (L) 10/08/2019   PLT 209 10/08/2019    Patient Active Problem List   Diagnosis Date Noted  . Gestational hypertension 09/23/2019  . Migraine headache 07/12/2019  . Alpha thalassemia trait   . Gestational diabetes 05/23/2019  . Supervision of normal first pregnancy 04/06/2019  . Obesity during pregnancy, antepartum 04/06/2019  . Nausea and vomiting during pregnancy 04/06/2019  . Vitamin D deficiency 09/16/2017  . Prediabetes 09/16/2017  . Depression 09/16/2017  . Class 3 severe obesity with serious comorbidity and body mass index (BMI) of 50.0 to 59.9 in adult ([place]) 09/16/2017  . PCOS (polycystic ovarian syndrome) 08/13/2017  . Mass 05/06/2017  . Vertigo 10/06/2016  . Anemia 09/26/2016  . Pseudotumor cerebri syndrome 08/28/2016  . Obesity hypoventilation  syndrome ([place]) 08/28/2016  . Benign paroxysmal positional vertigo due to bilateral vestibular disorder 08/28/2016  . OSA (obstructive sleep apnea) 08/28/2016  . Hypersomnia with sleep apnea 08/28/2016  . Female hirsutism 08/28/2016  . Back pain 04/16/2015  . GERD (gastroesophageal reflux disease) 02/24/2015  . Constipation 10/02/2014  . Shoulder pain, left 01/29/2014    Assessment / Plan: 23 y.o. G1P0000 at [redacted]w[redacted]d here for IOL for GHTN   Labor: AROM and IUPC placed, continue to titrate pitocin until MVUs around 220 and/or active labor  Fetal Wellbeing:  Cat I  Pain Control:  Epidural  Anticipated MOD:  SVD  [name], CNM 10/09/2019, 1:32 AM

## 2019-10-09 NOTE — Transfer of Care (Signed)
Immediate Anesthesia Transfer of Care Note  Patient: Maureen Lewis  Procedure(s) Performed: CESAREAN SECTION (N/A Abdomen)  Patient Location: PACU  Anesthesia Type:Epidural  Level of Consciousness: awake, alert  and oriented  Airway & Oxygen Therapy: Patient Spontanous Breathing  Post-op Assessment: Report given to RN and Post -op Vital signs reviewed and stable  Post vital signs: Reviewed and stable  Last Vitals:  Vitals Value Taken Time  BP 130/66 10/09/19 1822  Temp    Pulse 90 10/09/19 1824  Resp 27 10/09/19 1824  SpO2 99 % 10/09/19 1824  Vitals shown include unvalidated device data.  Last Pain:  Vitals:   10/09/19 1630  TempSrc: Oral  PainSc:          Complications: No apparent anesthesia complications

## 2019-10-09 NOTE — Progress Notes (Addendum)
Patient ID: Maureen Lewis, female   DOB: 12/26/1995, 23 y.o.   MRN: [id number] Doing well Vitals:   10/09/19 0800 10/09/19 0830 10/09/19 0900 10/09/19 0903  BP: (!) 143/83 (!) 147/80 (!) 138/110 (!) 150/70  Pulse: 86 90 90 93  Resp: 18 18 18 18   Temp: 98.2 F (36.8 C)     TempSrc: Oral     Weight:      Height:       EFW 8+lbs by my Leopolds  FHR reactive UCs regular  Cervix exam deferred  Plan discussed with Dr Ilda Basset WIll not do a Pitocin break Will continue infusion, max at 43mu/min Reevaluate at about 1700hrs  Seabron Spates, CNM

## 2019-10-09 NOTE — Progress Notes (Signed)
Labor Progress Note Maureen Lewis is a 23 y.o. G1P0000 at [redacted]w[redacted]d presented for IOL for GHTN - patient induction process started 12/18 at midnight.  S: Comfortable w/ epidural, feels only a little pressure w/ CTX. Tracing well w/ IUPC. No HA, vision changes, CP, SOB.  O:  BP 130/63   Pulse 90   Temp 97.7 F (36.5 C) (Oral)   Resp 17   Ht 6\' 2"  (1.88 m)   Wt (!) 187.8 kg   LMP 01/21/2019 (Exact Date)   BMI 53.17 kg/m  EFM: 155bpm/mod var/+accels, no decels  CVE: Dilation: 3 Effacement (%): 60 Cervical Position: Posterior Station: -2 Presentation: Vertex(Verified with bedside U/S) Exam by:: Dr. Lia Foyer   A&P: 23 y.o. G1P0000 [redacted]w[redacted]d  IOL for GHTN - patient induction process started 12/18 at midnight.  #Labor: Progressing slowly. S/p FB, cytotec x7 doses. She did get an epidural and was started on pit gtt 2254, up to 69mU/min but still not making much cervical change. Uptitrate per protocol. Unable to palpate sutures on exam, confirmed vertex presentation w/ bedside US. Anticipate vaginal delivery. #Pain: Epidural #FWB: Cat 1 FHT, HR is borderline high but has good variability #GBS negative #gHTN: Pressure have been normal, asymptomatic. Cont to monitor. #A2gDM: BG WNL, continue to monitor q4h in latent labor and q2h in active labor. Cont NPH 14u qhs, metformin 1000 BID.  Demetrius Revel, MD 5:36 AM

## 2019-10-09 NOTE — Anesthesia Postprocedure Evaluation (Signed)
Anesthesia Post Note  Patient: Maureen Lewis  Procedure(s) Performed: CESAREAN SECTION (N/A Abdomen)     Patient location during evaluation: PACU Anesthesia Type: Epidural Level of consciousness: oriented and awake and alert Pain management: pain level controlled Vital Signs Assessment: post-procedure vital signs reviewed and stable Respiratory status: spontaneous breathing, respiratory function stable and patient connected to nasal cannula oxygen Cardiovascular status: blood pressure returned to baseline and stable Postop Assessment: no headache, no backache, no apparent nausea or vomiting and epidural receding Anesthetic complications: no    Last Vitals:  Vitals:   10/09/19 2050 10/09/19 2110  BP: (!) 147/87 (!) 147/86  Pulse: (!) 114 (!) 112  Resp: (!) 24 (!) 24  Temp:  37.6 C  SpO2: 98% 98%    Last Pain:  Vitals:   10/09/19 2110  TempSrc: Oral  PainSc:    Pain Goal:                   Abigail Teall L Kaylene Dawn

## 2019-10-09 NOTE — Lactation Note (Signed)
This note was copied from a Maureen's chart. Lactation Consultation Note  Patient Name: Maureen Lewis Today's Date: 10/09/2019 Reason for consult: Initial assessment;Primapara;1st time breastfeeding;Early term 37-38.6wks;Maternal endocrine disorder Type of Endocrine Disorder?: Diabetes  2300 - I followed up with Maureen Lewis. She states that Maureen latched in recovery for about 15 minutes and then latched again about an hour ago for 3 minutes. I conducted breast feeding basics and reviewed hand expression. Her nipples are short and compressible. Maureen Lewis was sleeping on her chest STS.  I recommended breast feeding on demand 8-12 times a day and reviewed feeding cues. I recommended that she wake Maureen to feed if interval is 4 hours since last feeding.   Maureen Lewis has a personal electric pump at home. I brought her a manual pump with size 27 flange and showed her how to clean it and demonstrated how to use it. I recommended pumping between feedings for stimulation and pre-pumping prior to feeding to help stimulate her milk and to help elongate her nipples for latch.    Maureen Lewis verbalized understanding. I encouraged her to call lactation as needed for follow up tonight.  Maternal Data Has patient been taught Hand Expression?: Yes Does the patient have breastfeeding experience prior to this delivery?: No  Feeding Feeding Type: Breast Fed  LATCH Score Latch: Repeated attempts needed to sustain latch, nipple held in mouth throughout feeding, stimulation needed to elicit sucking reflex.  Audible Swallowing: A few with stimulation  Type of Nipple: Flat  Comfort (Breast/Nipple): Soft / non-tender  Hold (Positioning): Assistance needed to correctly position infant at breast and maintain latch.  LATCH Score: 6  Interventions Interventions: Breast feeding basics reviewed;Hand pump   Consult Status Consult Status: Follow-up Date: 10/10/19 Follow-up type: In-patient    Lenore Manner 10/09/2019, 11:23 PM

## 2019-10-09 NOTE — Progress Notes (Signed)
CNM called due to severe range BPs in PACU. Denies HA, visual changes, or epigastric pain. Incision pain well controlled. IV antihypertensive protocol ordered and labs repeated. Will start Norvasc 10mg  in AM as well.  Wende Mott, CNM 10/09/19 8:34 PM

## 2019-10-09 NOTE — Progress Notes (Signed)
Labor Progress Note Maureen Lewis is a 23 y.o. G1P0000 at [redacted]w[redacted]d presented for IO for gHTN and A2 gDM. S: Doing well s/p epidural placement.  O:  BP (!) 117/55   Pulse 99   Temp 98.1 F (36.7 C) (Oral)   Resp 18   Ht 6\' 2"  (1.88 m)   Wt (!) 187.8 kg   LMP 01/21/2019 (Exact Date)   BMI 53.17 kg/m  EFM: 155bpm/mod var/ + accels, no decels  CVE: Dilation: 3 Effacement (%): 50 Cervical Position: Posterior Station: -2 Presentation: Vertex Exam by:: Meghan Rice, RN   A&P: 23 y.o. G1P0000 [redacted]w[redacted]d IOL due to gHTN and A2gDM #Labor: Progressing slowly. S/p FB, cytotec x7 doses. She did get an epidural and was started on pit gtt 2254, up to 64mU/min. Uptitrate per protocol. Anticipate vaginal delivery. #Pain: epidural #FWB: Cat 1 FHT #GBS negative #gHTN: Pressure have been normal, symptomatic. Cont to monitor. #A2gDM: BG WNL, continue to monitor q4h in latent labor and q2h in active labor. Cont NPH 14u qhs, metformin 1000 BID.  Demetrius Revel, MD 12:11 AM

## 2019-10-09 NOTE — Progress Notes (Signed)
L&D Note  S: not feeling any UCs  Patient Vitals for the past 6 hrs:  BP Temp Temp src Pulse Resp  10/09/19 1600 (!) 151/84 -- -- 91 18  10/09/19 1530 (!) 160/91 -- -- 92 18  10/09/19 1500 (!) 152/84 -- -- 89 18  10/09/19 1430 133/67 -- -- 91 18  10/09/19 1408 131/62 -- -- 96 18  10/09/19 1400 (!) 134/92 99.2 F (37.3 C) Oral 89 18  10/09/19 1330 136/85 -- -- 84 18  10/09/19 1300 125/84 -- -- 88 18  10/09/19 1200 136/84 -- -- 89 18  10/09/19 1130 (!) 130/96 -- -- 100 18  10/09/19 1100 129/74 -- -- 74 18  10/09/19 1033 136/79 -- -- 87 18  10/09/19 1030 (!) 175/82 98.5 F (36.9 C) Oral 95 18   FHT: 160 baseline, +accels, no decel, mod variability Toco: q2-16m  NAD SVE: unchanged at 2-3/50/high  Labs CBGs normal  A/p: failed IOL I told her I recommend c/s given lack of cervical change on pitocin for close to 15 hours. Also, FHR technically normal but its baseline is higher now at 160, so could be impending chorio. Pt amenable to plan. Can proceed when OR is ready. Will do ancef 3gm and azithro  Durene Romans MD Attending Center for [facility] (Faculty Practice) 10/09/2019 Time: [phone]

## 2019-10-09 NOTE — Op Note (Signed)
Operative Note   SURGERY DATE: 10/09/2019  PRE-OP DIAGNOSIS:  *Pregnancy at 48/2 *Failed induction of labor for gestational HTN *GDMa2 (insulin and metformin) *BMI 50s  POST-OP DIAGNOSIS: Same. delivered   PROCEDURE: primary low transverse cesarean section via pfannenstiel skin incision with double layer uterine closure  SURGEON: Surgeon(s) and Role:    Aletha Halim, MD - Primary  ASSISTANT: Revonda Standard, Scrub Tech.   Due to the patient's body habitus assistance was needed.   ANESTHESIA: epidural  ESTIMATED BLOOD LOSS: 552mL  DRAINS: 266mL UOP via indwelling foley  TOTAL IV FLUIDS: 923mL crystalloid  VTE PROPHYLAXIS: SCDs to bilateral lower extremities  ANTIBIOTICS: Ancef 3gm given within 1 hour of skin incision and azithromycin 500mg  IV given intraoperatively  SPECIMENS: placenta  COMPLICATIONS: none  INDICATIONS: Patient presented at 37/0 for IOL for GHTN. She had multiple doses of cytotec and then had a cook catheter cervical balloon placed. She was eventually able to be AROM'ed with pitocin continued. After close to 15 hours after AROM with pitocin, the patient's cervix was unchanged at 2-3/50/high. Given this, I d/w her re: failed IOL. Also, the fetal HR baseline had increased to close 160 and concern for impending chorio.  I recommended proceeding with primary c-section which she was amenable.   FINDINGS: No intra-abdominal adhesions were noted. Grossly normal uterus, tubes and ovaries. Clear amniotic fluid, cephalic,  female infant, weight 3370gm, APGARs 8/9, intact placenta.  PROCEDURE IN DETAIL: The patient was taken to the operating room where anesthesia was administered and normal fetal heart tones were confirmed. She was then prepped and draped in the normal fashion in the dorsal supine position with a leftward tilt; a Traxi retractor was used  After a time out was performed, a pfannensteil  skin incision was made with the scalpel and carried through to  the underlying layer of fascia, in the suprapubic area below the pannus crease. The fascia was then incised at the midline and this incision was extended laterally with the mayo scissors. Attention was turned to the superior aspect of the fascial incision which was grasped with the kocher clamps x 2, tented up and the rectus muscles were dissected off with the bovie. In a similar fashion the inferior aspect of the fascial incision was grasped with the kocher clamps, tented up and the rectus muscles dissected off with the mayo scissors. The rectus muscles were then separated in the midline and the peritoneum was entered bluntly. The bladder blade was inserted and the vesicouterine peritoneum was identified, tented up and entered with the metzenbaum scissors. This incision was extended laterally and the bladder flap was created digitally. The bladder blade was reinserted.  A low transverse hysterotomy was made with the scalpel until the endometrial cavity was breached and the amniotic sac ruptured, yielding clear amniotic fluid. This incision was extended bluntly and the infant's head was delivered with the assistance of the Kiwi vacuum used per the manufacturer's instructions. Once the head was delivered, the [facility] was disengaged, and the shoulders and body were delivered atraumatically.The cord was clamped x 2 and cut, and the infant was handed to the awaiting pediatricians.  The placenta was then gradually expressed from the uterus and then the uterus was exteriorized and cleared of all clots and debris. The hysterotomy was repaired with a running suture of 1-0 monocryl. A second imbricating layer of 1-0 monocryl suture was then placed.   The uterus and adnexa were then returned to the abdomen, and the hysterotomy and all  operative sites were reinspected and excellent hemostasis was noted after irrigation and suction of the abdomen with warm saline.  The peritoneum was closed with a running stitch of 3-0  Vicryl. The fascia was reapproximated with 0 Vicryl in a simple running fashion bilaterally. The subcutaneous layer was then reapproximated with interrupted sutures of 2-0 plain gut, and the skin was then closed with 4-0 monocryl, in a subcuticular fashion.   Benzoin and steri strips were then placed and then the Prevena device placed.   The patient  tolerated the procedure well. Sponge, lap, needle, and instrument counts were correct x 2. The patient was transferred to the recovery room awake, alert and breathing independently in stable condition.  Durene Romans MD Attending Center for [facility])

## 2019-10-09 NOTE — Progress Notes (Addendum)
Patient had received 20 mg lebatalol in PACU for elevated blood pressures (164/94 @2000 ), 152/93@2002 ), at 2030. When she arrived at M/B, pressures had come down to 147/87@2050 , 147/86@2110 .  Norvasc has also been ordered to start at 2100.  Called pharmacy [facility]) about possible interactions between the two medications.    Double checked with Dellia Cloud, MD. Start Norvasc in morning.

## 2019-10-10 ENCOUNTER — Encounter (HOSPITAL_COMMUNITY): Payer: Self-pay | Admitting: Family Medicine

## 2019-10-10 LAB — CBC
HCT: 28.7 % — ABNORMAL LOW (ref 36.0–46.0)
Hemoglobin: 8.8 g/dL — ABNORMAL LOW (ref 12.0–15.0)
MCH: 21.3 pg — ABNORMAL LOW (ref 26.0–34.0)
MCHC: 30.7 g/dL (ref 30.0–36.0)
MCV: 69.3 fL — ABNORMAL LOW (ref 80.0–100.0)
Platelets: 176 10*3/uL (ref 150–400)
RBC: 4.14 MIL/uL (ref 3.87–5.11)
RDW: 15.7 % — ABNORMAL HIGH (ref 11.5–15.5)
WBC: 11.1 10*3/uL — ABNORMAL HIGH (ref 4.0–10.5)
nRBC: 0 % (ref 0.0–0.2)

## 2019-10-10 MED ORDER — FERROUS SULFATE 325 (65 FE) MG PO TABS
325.0000 mg | ORAL_TABLET | Freq: Every day | ORAL | Status: DC
  Administered 2019-10-11: 325 mg via ORAL
  Filled 2019-10-10: qty 1

## 2019-10-10 MED ORDER — OXYCODONE-ACETAMINOPHEN 5-325 MG PO TABS
2.0000 | ORAL_TABLET | ORAL | Status: DC | PRN
  Administered 2019-10-10 – 2019-10-11 (×6): 2 via ORAL
  Filled 2019-10-10 (×6): qty 2

## 2019-10-10 MED ORDER — KETOROLAC TROMETHAMINE 60 MG/2ML IM SOLN
60.0000 mg | Freq: Once | INTRAMUSCULAR | Status: AC
  Administered 2019-10-10: 60 mg via INTRAMUSCULAR
  Filled 2019-10-10 (×2): qty 2

## 2019-10-10 MED ORDER — GABAPENTIN 100 MG PO CAPS
100.0000 mg | ORAL_CAPSULE | Freq: Two times a day (BID) | ORAL | Status: DC
  Administered 2019-10-10 – 2019-10-11 (×4): 100 mg via ORAL
  Filled 2019-10-10 (×4): qty 1

## 2019-10-10 NOTE — Lactation Note (Signed)
This note was copied from a baby's chart. Lactation Consultation Note Baby 39 hrs old. Mom in a lot of pain. Can't even tolerate gown touching abd.  LC called d/t baby cueing, mom isn't able to BF d/t pain. LC hand expressed long tubular breast w/flat compressible nipples at bottom end of breast. LC hand expressed 5 ml colostrum spoon fed to baby. Baby tongue thrusting at times. W/gloved finger assessed suck. Baby biting w/uncoordinated suck swallow patterns. MGM holding and caring for baby since mom in pain. Newborn behavior, feeding habits, STS, I&O discussed. RN has called MD for pain management assistance.  Patient Name: Maureen Lewis Today's Date: 10/10/2019 Reason for consult: Mother's request;Difficult latch;Primapara;Early term 37-38.6wks Type of Endocrine Disorder?: PCOS   Maternal Data Has patient been taught Hand Expression?: Yes Does the patient have breastfeeding experience prior to this delivery?: No  Feeding Feeding Type: Breast Milk  LATCH Score       Type of Nipple: Flat  Comfort (Breast/Nipple): Soft / non-tender        Interventions Interventions: Expressed milk;Breast massage;Hand express  Lactation Tools Discussed/Used     Consult Status Consult Status: Follow-up Date: 10/10/19 Follow-up type: In-patient    Theodoro Kalata 10/10/2019, 2:48 AM

## 2019-10-10 NOTE — Lactation Note (Signed)
This note was copied from a baby's chart. Lactation Consultation Note  Patient Name: Maureen Lewis Today's Date: 10/10/2019 Reason for consult: Follow-up assessment;Difficult latch Baby is 21 hours old/2% weight loss.  Mom states latching is difficult due to size of breasts and pain from C/S.  Mom has spoon fed baby hand expressed colostrum.  Mom has very large breasts.  Nipples are erect but right areola is swollen and firm.  Reverse pressure done .  Unable to express colostrum from right.  Left breast more compressible and colostrum is easily expressed.  Breast supported with a rolled cloth underneath.  Assisted with positioning baby in the football hold.  Several attempts made to latch baby but infant was unable to sustain the latch.  24 mm nipple shield applied but baby still had difficulty.  Shield changed to a 20 mm and baby was able to feed off and on for 15 minutes.  Milk in shield after feeding.  Baby then latched well without the shield for another 5 minutes.  Symphony pump set up and initiated.  Instructed to pump every 3 hours x 15 minutes and give colostrum to baby.  Mom will feed with cues and call for assist prn.  Maternal Data    Feeding Feeding Type: Breast Fed  LATCH Score Latch: Repeated attempts needed to sustain latch, nipple held in mouth throughout feeding, stimulation needed to elicit sucking reflex.  Audible Swallowing: A few with stimulation  Type of Nipple: Everted at rest and after stimulation  Comfort (Breast/Nipple): Soft / non-tender  Hold (Positioning): Full assist, staff holds infant at breast  LATCH Score: 6  Interventions Interventions: Adjust position;DEBP;Assisted with latch;Support pillows;Skin to skin;Hand express;Reverse pressure;Breast compression  Lactation Tools Discussed/Used Tools: (spoon) Pump Review: Setup, frequency, and cleaning Initiated by:: [email or web address] Date initiated:: 10/10/19   Consult Status Consult Status: Follow-up Date:  10/11/19 Follow-up type: In-patient    Ave Filter 10/10/2019, 3:17 PM

## 2019-10-10 NOTE — Progress Notes (Signed)
CNM called regarding patient's pain. Patient allergy list reports hx of anaphylaxis with hydrocodone. Per chart review, patient has had percocet since possible reaction without problem. Discussed at length with patient and mother. Both agreeable to try percocet inpatient.   Wende Mott, CNM 10/10/19 1:15 AM

## 2019-10-10 NOTE — Progress Notes (Signed)
MOB was referred for history of depression/anxiety. * Referral screened out by Clinical Social Worker because none of the following criteria appear to apply: ~ History of anxiety/depression during this pregnancy, or of post-partum depression following prior delivery. ~ Diagnosis of anxiety and/or depression within last 3 years OR * MOB's symptoms currently being treated with medication and/or therapy.    Please contact the Clinical Social Worker if needs arise, by MOB request, or if MOB scores greater than 9/yes to question 10 on Edinburgh Postpartum Depression Screen.   Mckenzi Buonomo S. Verda Mehta, MSW, LCSW [facility] [phone]    

## 2019-10-10 NOTE — Progress Notes (Signed)
Patient c/o of severe pain. Called provider. New orders

## 2019-10-10 NOTE — Progress Notes (Signed)
Post Operative Day 1  Subjective:  Maureen Lewis is a 23 y.o. G1P1001 [redacted]w[redacted]d s/p pLTCS.  No acute events overnight.  Pt denies problems with ambulating, voiding or po intake.  She denies nausea or vomiting.  Pain is well controlled.  She has had flatus. She has not had bowel movement.  Lochia Small.  Plan for birth control is condoms.  Method of Feeding: breast. Pt on Norvasc 10mg . Denies s/sx of postpartum preeclampsia.  Objective: BP 130/77   Pulse 92   Temp 98.4 F (36.9 C)   Resp 18   Ht 6\' 2"  (1.88 m)   Wt (!) 187.8 kg   LMP 01/21/2019 (Exact Date)   SpO2 99%   Breastfeeding Unknown   BMI 53.17 kg/m   Patient Vitals for the past 24 hrs:  BP Temp Temp src Pulse Resp SpO2  10/10/19 0815 130/77 98.4 F (36.9 C) -- 92 18 99 %  10/10/19 0402 -- 98.6 F (37 C) Oral -- -- --  10/10/19 0051 (!) 146/82 99.8 F (37.7 C) Oral (!) 102 (!) 24 97 %  10/09/19 2215 (!) 151/83 98.8 F (37.1 C) Oral 90 (!) 24 99 %  10/09/19 2110 (!) 147/86 99.6 F (37.6 C) Oral (!) 112 (!) 24 98 %  10/09/19 2050 (!) 147/87 -- -- (!) 114 (!) 24 98 %  10/09/19 2040 (!) 150/85 -- -- (!) 111 (!) 23 98 %  10/09/19 2031 (!) 148/89 -- -- 95 (!) 29 96 %  10/09/19 2025 (!) 154/88 -- -- 91 20 98 %  10/09/19 2016 (!) 161/85 -- -- 90 (!) 30 97 %  10/09/19 2002 (!) 152/93 -- -- 91 (!) 23 99 %  10/09/19 2000 (!) 164/94 -- -- 81 (!) 27 97 %  10/09/19 1930 (!) 151/89 -- -- 91 (!) 22 99 %  10/09/19 1915 137/76 -- -- 96 (!) 21 98 %  10/09/19 1900 126/72 -- -- 84 (!) 26 99 %  10/09/19 1845 130/69 98.5 F (36.9 C) Oral 86 (!) 21 99 %  10/09/19 1840 -- -- -- 98 (!) 21 99 %  10/09/19 1835 -- -- -- 87 (!) 23 100 %  10/09/19 1830 135/74 -- -- 80 15 97 %  10/09/19 1825 -- -- -- 90 (!) 27 99 %  10/09/19 1822 130/66 99.3 F (37.4 C) Oral 89 (!) 22 96 %  10/09/19 1630 (!) 152/97 99.2 F (37.3 C) Oral 97 18 --  10/09/19 1600 (!) 151/84 -- -- 91 18 --  10/09/19 1530 (!) 160/91 -- -- 92 18 --  10/09/19 1500 (!) 152/84 -- --  89 18 --  10/09/19 1430 133/67 -- -- 91 18 --  10/09/19 1408 131/62 -- -- 96 18 --  10/09/19 1400 (!) 134/92 99.2 F (37.3 C) Oral 89 18 --  10/09/19 1330 136/85 -- -- 84 18 --  10/09/19 1300 125/84 -- -- 88 18 --  10/09/19 1200 136/84 -- -- 89 18 --   Physical Exam:  General: alert, cooperative and no distress Lochia:normal flow Chest: normal work of breathing Heart: regular rate Abdomen: soft, nontender Uterine Fundus: firm Incision: Dressing is clean, dry, and intact DVT Evaluation: No evidence of DVT seen on physical exam.   Recent Labs    10/09/19 2010 10/10/19 0534  HGB 10.0* 8.8*  HCT 31.4* 28.7*    Assessment/Plan:  ASSESSMENT: Maureen Lewis is a 23 y.o. G1P1001 [redacted]w[redacted]d ppd #1 s/p NSVD/VAVD/FAVD doing well.   Hgb today 8.8, oral iron  ordered. Continue routine PP care   LOS: 3 days    I confirm that I have verified the information documented in the medical student's note and that I have also personally reperformed the history, physical exam and all medical decision making activities of this service and have verified that all service and findings are accurately documented in this student's note.  Clarisa Fling, NP 10/10/2019, 11:41 AM

## 2019-10-11 MED ORDER — OXYCODONE-ACETAMINOPHEN 5-325 MG PO TABS: 2.0000 | ORAL_TABLET | ORAL | 0 refills | Status: DC | PRN

## 2019-10-11 MED ORDER — FERROUS SULFATE 325 (65 FE) MG PO TABS: 325.0000 mg | ORAL_TABLET | ORAL | 2 refills | Status: DC

## 2019-10-11 MED ORDER — AMLODIPINE BESYLATE 10 MG PO TABS: 10.0000 mg | ORAL_TABLET | Freq: Every day | ORAL | 2 refills | Status: DC

## 2019-10-11 MED ORDER — POLYETHYLENE GLYCOL 3350 17 GM/SCOOP PO POWD: 1.0000 | Freq: Once | ORAL | 0 refills | Status: AC

## 2019-10-11 MED ORDER — IBUPROFEN 800 MG PO TABS: 800.0000 mg | ORAL_TABLET | Freq: Three times a day (TID) | ORAL | 0 refills | Status: DC

## 2019-10-11 MED FILL — AMLODIPINE BESYLATE 10 MG T: 10 | 30 days supply | Qty: 30 | Fill #0

## 2019-10-11 MED FILL — POLYETHYLENE GLYCOL 3350 PO: 17 | 1 days supply | Qty: 238 | Fill #0

## 2019-10-11 MED FILL — IBUPROFEN 800 MG TAB: 800 | 10 days supply | Qty: 30 | Fill #0

## 2019-10-11 MED FILL — OXYCODONE-APAP 5-325MG: 5-325 | 5 days supply | Qty: 30 | Fill #0

## 2019-10-11 MED FILL — FERROUS SULFATE 325 MG TAB: 325 (65 FE) | 60 days supply | Qty: 30 | Fill #0

## 2019-10-11 NOTE — Progress Notes (Signed)
AVS printed and discharge instructions given to patient. Patient states that she has received her prescriptions and has she follow up appointment set up. Pt verbalized understanding and has no further questions. Pt discharged with her grandmother in the room.

## 2019-10-11 NOTE — Lactation Note (Addendum)
This note was copied from a baby's chart. Lactation Consultation Note: Infant is discharged and mother is waiting to be discharged.  Mother reports that infant breastfed for 5 mins and then she reports that she formula fed infant 30 ml.  Mother reports that she hand expressed 5 ml . Mother reports that she has a Medela pump at home and plans to continue to pump every 2-3 hours for 15 mins. Discussed treatment and prevention of engorgement.  Mother advised to continue to cue base feed and feed infant 8-12 times or more in 24 hours. Mother advised to do frequent STS .   Mother was informed of available [place] services, [facility]', OP dept. 24 hour phone line for breastfeeding questions or concerns.        Patient Name: Maureen Lewis Today's Date: 10/11/2019 Reason for consult: Follow-up assessment   Maternal Data    Feeding Feeding Type: Breast Fed  LATCH Score                   Interventions Interventions: Hand express;Hand pump;DEBP  Lactation Tools Discussed/Used Tools: Nipple Shields Nipple shield size: 24   Consult Status Consult Status: Complete    Darla Lesches 10/11/2019, 3:01 PM

## 2019-10-11 NOTE — Lactation Note (Signed)
This note was copied from a baby's chart. Lactation Consultation Note  Patient Name: Maureen Lewis Today's Date: 10/11/2019 Reason for consult: Follow-up assessment;Difficult latch;Early term 37-38.6wks;Maternal endocrine disorder Type of Endocrine Disorder?: Diabetes P1, 50 hour female infant. Mom's hx: GDM and C/S delivery right  breast and feet are swollen. Tools given: DEBP, Hand pump, 30 mm breast flange by LC  Breast shells and 24 mm NS given by RN  Mom's current feeding choice is breast and formula feeding. Per mom, she has been using size 24 mm NS and infant not been latching well at breast. LC entered the room, mom had given infant 10 mls of formula and infant was not cuing to breastfeed at this time. Per mom, she has started using DEBP as advised earlier by [facility] services.  Per mom, she has swelling in her right breast (edema). LC used moist heat with cloth and did reverse pressure softening, mom hand expressed 4 mls of colostrum and felt relief after hand expressing right breast.  Mom fitted with 30 mm breast flange and was using DEBP when [place] left room.  Mom knows to call RN or LC to help assist with latching infant to breast if needed.  Mom's current feeding plan: 1. Continue to work on latching infant at breast using 24 mm NS, breastfeeding infant according to cues, 8 to 12 times within 24 hours. 2. Mom will hand express and used DEBP and give infant any EBM first before using formula supplement based on infant's age/ hours of life,  which is 7-12 mls per feeding from Day 2 ( 24- 48 hours of life) . 3. RN gave mom breastfeeding supplemental sheet. 4. Mom will continue to do STS as much as possible.  Maternal Data    Feeding Feeding Type: Bottle Fed - Formula Nipple Type: Slow - flow  LATCH Score                   Interventions Interventions: Reverse pressure;Shells;Hand express  Lactation Tools Discussed/Used Tools: Shells;Flanges Flange Size:  30   Consult Status Consult Status: Follow-up Date: 10/11/19 Follow-up type: In-patient    Vicente Serene 10/11/2019, 5:16 AM

## 2019-10-11 NOTE — Discharge Instructions (Signed)
You should be seen in the clinic at: 1 week to check your wound vacuum 2 weeks to check your cesarean incision 6 weeks for a routine post-partum visit, and you should also have a sugar test at that visit  Continue to check your blood pressure while at home. If you have a headache that doesn't go away, vision changes, chest pain, or shortness of breath, call the office or come to the hospital.    Cesarean Delivery, Care After This sheet gives you information about how to care for yourself after your procedure. Your health care provider may also give you more specific instructions. If you have problems or questions, contact your health care provider. What can I expect after the procedure? After the procedure, it is common to have:  A small amount of blood or clear fluid coming from the incision.  Some redness, swelling, and pain in your incision area.  Some abdominal pain and soreness.  Vaginal bleeding (lochia). Even though you did not have a vaginal delivery, you will still have vaginal bleeding and discharge.  Pelvic cramps.  Fatigue. You may have pain, swelling, and discomfort in the tissue between your vagina and your anus (perineum) if:  Your C-section was unplanned, and you were allowed to labor and push.  An incision was made in the area (episiotomy) or the tissue tore during attempted vaginal delivery. Follow these instructions at home: Incision care   Follow instructions from your health care provider about how to take care of your incision. Make sure you: ? Wash your hands with soap and water before you change your bandage (dressing). If soap and water are not available, use hand sanitizer. ? If you have a dressing, change it or remove it as told by your health care provider. ? Leave stitches (sutures), skin staples, skin glue, or adhesive strips in place. These skin closures may need to stay in place for 2 weeks or longer. If adhesive strip edges start to loosen and curl  up, you may trim the loose edges. Do not remove adhesive strips completely unless your health care provider tells you to do that.  Check your incision area every day for signs of infection. Check for: ? More redness, swelling, or pain. ? More fluid or blood. ? Warmth. ? Pus or a bad smell.  Do not take baths, swim, or use a hot tub until your health care provider says it's okay. Ask your health care provider if you can take showers.  When you cough or sneeze, hug a pillow. This helps with pain and decreases the chance of your incision opening up (dehiscing). Do this until your incision heals. Medicines  Take over-the-counter and prescription medicines only as told by your health care provider.  If you were prescribed an antibiotic medicine, take it as told by your health care provider. Do not stop taking the antibiotic even if you start to feel better.  Do not drive or use heavy machinery while taking prescription pain medicine. Lifestyle  Do not drink alcohol. This is especially important if you are breastfeeding or taking pain medicine.  Do not use any products that contain nicotine or tobacco, such as cigarettes, e-cigarettes, and chewing tobacco. If you need help quitting, ask your health care provider. Eating and drinking  Drink at least 8 eight-ounce glasses of water every day unless told not to by your health care provider. If you breastfeed, you may need to drink even more water.  Eat high-fiber foods every day. These  foods may help prevent or relieve constipation. High-fiber foods include: ? Whole grain cereals and breads. ? Brown rice. ? Beans. ? Fresh fruits and vegetables. Activity   If possible, have someone help you care for your baby and help with household activities for at least a few days after you leave the hospital.  Return to your normal activities as told by your health care provider. Ask your health care provider what activities are safe for you.  Rest as  much as possible. Try to rest or take a nap while your baby is sleeping.  Do not lift anything that is heavier than 10 lbs (4.5 kg), or the limit that you were told, until your health care provider says that it is safe.  Talk with your health care provider about when you can engage in sexual activity. This may depend on your: ? Risk of infection. ? How fast you heal. ? Comfort and desire to engage in sexual activity. General instructions  Do not use tampons or douches until your health care provider approves.  Wear loose, comfortable clothing and a supportive and well-fitting bra.  Keep your perineum clean and dry. Wipe from front to back when you use the toilet.  If you pass a blood clot, save it and call your health care provider to discuss. Do not flush blood clots down the toilet before you get instructions from your health care provider.  Keep all follow-up visits for you and your baby as told by your health care provider. This is important. Contact a health care provider if:  You have: ? A fever. ? Bad-smelling vaginal discharge. ? Pus or a bad smell coming from your incision. ? Difficulty or pain when urinating. ? A sudden increase or decrease in the frequency of your bowel movements. ? More redness, swelling, or pain around your incision. ? More fluid or blood coming from your incision. ? A rash. ? Nausea. ? Little or no interest in activities you used to enjoy. ? Questions about caring for yourself or your baby.  Your incision feels warm to the touch.  Your breasts turn red or become painful or hard.  You feel unusually sad or worried.  You vomit.  You pass a blood clot from your vagina.  You urinate more than usual.  You are dizzy or light-headed. Get help right away if:  You have: ? Pain that does not go away or get better with medicine. ? Chest pain. ? Difficulty breathing. ? Blurred vision or spots in your vision. ? Thoughts about hurting yourself or  your baby. ? New pain in your abdomen or in one of your legs. ? A severe headache.  You faint.  You bleed from your vagina so much that you fill more than one sanitary pad in one hour. Bleeding should not be heavier than your heaviest period. Summary  After the procedure, it is common to have pain at your incision site, abdominal cramping, and slight bleeding from your vagina.  Check your incision area every day for signs of infection.  Tell your health care provider about any unusual symptoms.  Keep all follow-up visits for you and your baby as told by your health care provider. This information is not intended to replace advice given to you by your health care provider. Make sure you discuss any questions you have with your health care provider. Document Released: 06/28/2002 Document Revised: 04/14/2018 Document Reviewed: 04/14/2018 Elsevier Patient Education  [employer].    Hypertension  During Pregnancy Hypertension is also called high blood pressure. High blood pressure means that the force of your blood moving in your body is too strong. It can cause problems for you and your baby. Different types of high blood pressure can happen during pregnancy. The types are:  High blood pressure before you got pregnant. This is called chronic hypertension.  This can continue during your pregnancy. Your doctor will want to keep checking your blood pressure. You may need medicine to keep your blood pressure under control while you are pregnant. You will need follow-up visits after you have your baby.  High blood pressure that goes up during pregnancy when it was normal before. This is called gestational hypertension. It will usually get better after you have your baby, but your doctor will need to watch your blood pressure to make sure that it is getting better.  Very high blood pressure during pregnancy. This is called preeclampsia. Very high blood pressure is an emergency that needs to  be checked and treated right away.  You may develop very high blood pressure after giving birth. This is called postpartum preeclampsia. This usually occurs within 48 hours after childbirth but may occur up to 6 weeks after giving birth. This is rare. How does this affect me? If you have high blood pressure during pregnancy, you have a higher chance of developing high blood pressure:  As you get older.  If you get pregnant again. In some cases, high blood pressure during pregnancy can cause:  Stroke.  Heart attack.  Damage to the kidneys, lungs, or liver.  Preeclampsia.  Jerky movements you cannot control (convulsions or seizures).  Problems with the placenta. How does this affect my baby? Your baby may:  Be born early.  Not weigh as much as he or she should.  Not handle labor well, leading to a c-section birth. What are the risks?  Having high blood pressure during a past pregnancy.  Being overweight.  Being 36 years old or older.  Being pregnant for the first time.  Being pregnant with more than one baby.  Becoming pregnant using fertility methods, such as IVF.  Having other problems, such as diabetes, or kidney disease.  Having family members who have high blood pressure. What can I do to lower my risk?   Keep a healthy weight.  Eat a healthy diet.  Follow what your doctor tells you about treating any medical problems that you had before becoming pregnant. It is very important to go to all of your doctor visits. Your doctor will check your blood pressure and make sure that your pregnancy is progressing as it should. Treatment should start early if a problem is found. How is this treated? Treatment for high blood pressure during pregnancy can differ depending on the type of high blood pressure you have and how serious it is.  You may need to take blood pressure medicine.  If you have been taking medicine for your blood pressure, you may need to change  the medicine during pregnancy if it is not safe for your baby.  If your doctor thinks that you could get very high blood pressure, he or she may tell you to take a low-dose aspirin during your pregnancy.  If you have very high blood pressure, you may need to stay in the hospital so you and your baby can be watched closely. You may also need to take medicine to lower your blood pressure. This medicine may be given by mouth  or through an IV tube.  In some cases, if your condition gets worse, you may need to have your baby early. Follow these instructions at home: Eating and drinking   Drink enough fluid to keep your pee (urine) pale yellow.  Avoid caffeine. Lifestyle  Do not use any products that contain nicotine or tobacco, such as cigarettes, e-cigarettes, and chewing tobacco. If you need help quitting, ask your doctor.  Do not use alcohol or drugs.  Avoid stress.  Rest and get plenty of sleep.  Regular exercise can help. Ask your doctor what kinds of exercise are best for you. General instructions  Take over-the-counter and prescription medicines only as told by your doctor.  Keep all prenatal and follow-up visits as told by your doctor. This is important. Contact a doctor if:  You have symptoms that your doctor told you to watch for, such as: ? Headaches. ? Nausea. ? Vomiting. ? Belly (abdominal) pain. ? Dizziness. ? Light-headedness. Get help right away if:  You have: ? Very bad belly pain that does not get better with treatment. ? A very bad headache that does not get better. ? Vomiting that does not get better. ? Sudden, fast weight gain. ? Sudden swelling in your hands, ankles, or face. ? Bleeding from your vagina. ? Blood in your pee. ? Blurry vision. ? Double vision. ? Shortness of breath. ? Chest pain. ? Weakness on one side of your body. ? Trouble talking.  Your baby is not moving as much as usual. Summary  High blood pressure is also called  hypertension.  High blood pressure means that the force of your blood moving in your body is too strong.  High blood pressure can cause problems for you and your baby.  Keep all follow-up visits as told by your doctor. This is important. This information is not intended to replace advice given to you by your health care provider. Make sure you discuss any questions you have with your health care provider. Document Released: 11/08/2010 Document Revised: 01/27/2019 Document Reviewed: 11/02/2018 Elsevier Patient Education  2020 [employer].

## 2019-10-11 NOTE — Discharge Summary (Signed)
Postpartum Discharge Summary    Patient Name: Maureen Lewis DOB: 1996-02-15 MRN: [id number]  Date of admission: 10/07/2019 Delivering Provider: Aletha Halim   Date of discharge: 10/11/2019  Admitting diagnosis: Gestational hypertension [O13.9] Intrauterine pregnancy: [redacted]w[redacted]d    Secondary diagnosis:  Active Problems:   Class 3 severe obesity with serious comorbidity and body mass index (BMI) of 50.0 to 59.9 in adult ([facility]   Supervision of normal first pregnancy   Gestational diabetes   Gestational hypertension   Positive RPR test   Cesarean delivery delivered  Additional problems:      Discharge diagnosis: Term Pregnancy Delivered, Gestational Hypertension and GDM A2                                                                                                Post partum procedures:None  Augmentation: AROM, Pitocin, Cytotec and Foley Balloon  Complications: None  Hospital course:  Induction of Labor With Cesarean Section  23y.o. yo G1P1001 at 383w2das admitted to the hospital 10/07/2019 for induction of labor. Patient had a labor course significant for: IOL for A2GDM and gHTN, induction started with misoprostol x7 and foley bulb. Reached 3 cm dilation and was started on pitocin and shortly after had AROM. She remained 3 cm for 18 hours and was then taken for cesarean due to failed IOL.  The patient went for cesarean section due to failed IOL, and delivered a Viable infant,10/09/2019  Membrane Rupture Time/Date: 12:58 AM ,10/09/2019   Details of operation can be found in separate operative Note.    Patient had an postpartum course notable for:  She was started on Norvasc '10mg'$  with good response, and was asymptomatic throughout. Warning signs discussed and instructed to check BP's daily at home.  An admission RPR was positive at 1:1, however follow up T.pallidum Ab was negative.  She was started on PO iron for hgb 8.8 on PP check.  At time of discharge she was ambulating,  tolerating a regular diet, passing flatus, and urinating well.  Patient is discharged home in stable condition on 10/11/19.                                   Delivery time: 5:15 PM    Magnesium Sulfate received: No BMZ received: No Rhophylac:N/A MMR:N/A Transfusion:No  Physical exam  Vitals:   10/10/19 1315 10/10/19 1843 10/10/19 2109 10/11/19 0530  BP: 133/90 122/63 121/78 128/74  Pulse: 100 97 (!) 108 98  Resp: '18 20 20 18  '$ Temp: 98.6 F (37 C) 98.6 F (37 C) 98.7 F (37.1 C) 98.5 F (36.9 C)  TempSrc:  Oral Oral Oral  SpO2: 98% 96%  98%  Weight:      Height:       General: alert, cooperative and no distress Lochia: appropriate Uterine Fundus: firm Incision: wound vac in place DVT Evaluation: No evidence of DVT seen on physical exam. No significant calf/ankle edema. Labs: Lab Results  Component Value Date   WBC 11.1 (H) 10/10/2019   HGB 8.8 (  L) 10/10/2019   HCT 28.7 (L) 10/10/2019   MCV 69.3 (L) 10/10/2019   PLT 176 10/10/2019   CMP Latest Ref Rng & Units 10/09/2019  Glucose 70 - 99 mg/dL 96  BUN 6 - 20 mg/dL 6  Creatinine 0.44 - 1.00 mg/dL 0.55  Sodium 135 - 145 mmol/L 136  Potassium 3.5 - 5.1 mmol/L 5.4(H)  Chloride 98 - 111 mmol/L 108  CO2 22 - 32 mmol/L 16(L)  Calcium 8.9 - 10.3 mg/dL 8.6(L)  Total Protein 6.5 - 8.1 g/dL 5.5(L)  Total Bilirubin 0.3 - 1.2 mg/dL 0.5  Alkaline Phos 38 - 126 U/L 88  AST 15 - 41 U/L 23  ALT 0 - 44 U/L 15    Discharge instruction: per After Visit Summary and "Baby and Me Booklet".  After visit meds:  Allergies as of 10/11/2019      Reactions   Bee Pollen Hives, Shortness Of Breath, Swelling   Bee Venom Hives, Shortness Of Breath, Swelling   Hydrocodone-acetaminophen Anaphylaxis   No problem when she takes Tylenol   Peach Flavor Hives, Shortness Of Breath, Other (See Comments)   SWELLING OF MOUTH   Pollen Extract Hives, Shortness Of Breath, Swelling      Medication List    STOP taking these medications    Accu-Chek FastClix Lancets Misc   Accu-Chek Guide test strip Generic drug: glucose blood   Accu-Chek Guide w/Device Kit   acetaminophen 325 MG tablet Commonly known as: TYLENOL   aspirin EC 81 MG tablet   insulin NPH Human 100 UNIT/ML injection Commonly known as: NOVOLIN N   metFORMIN 500 MG 24 hr tablet Commonly known as: Glucophage XR     TAKE these medications   amLODipine 10 MG tablet Commonly known as: NORVASC Take 1 tablet (10 mg total) by mouth daily.   Blood Pressure Monitoring Kit 1 kit by Does not apply route once a week.   calcium carbonate 500 MG chewable tablet Commonly known as: TUMS - dosed in mg elemental calcium Chew 2 tablets by mouth as needed for indigestion or heartburn.   [place] Supp Sm Misc Wear as directed.   cyclobenzaprine 10 MG tablet Commonly known as: FLEXERIL Take 1 tablet (10 mg total) by mouth 2 (two) times daily as needed for muscle spasms.   ferrous sulfate 325 (65 FE) MG tablet Take 1 tablet (325 mg total) by mouth every other day.   ibuprofen 800 MG tablet Commonly known as: ADVIL Take 1 tablet (800 mg total) by mouth every 8 (eight) hours.   loratadine 10 MG tablet Commonly known as: CLARITIN Take 10 mg by mouth daily as needed for allergies.   ondansetron 4 MG disintegrating tablet Commonly known as: Zofran ODT Take 1 tablet (4 mg total) by mouth every 8 (eight) hours as needed for nausea or vomiting.   oxyCODONE-acetaminophen 5-325 MG tablet Commonly known as: PERCOCET/ROXICET Take 2 tablets by mouth every 4 (four) hours as needed for moderate pain or severe pain.   polyethylene glycol powder 17 GM/SCOOP powder Commonly known as: GLYCOLAX/MIRALAX Take 255 g by mouth once for 1 dose.   PRENATAL VITAMINS PO Take by mouth.       Diet: carb modified diet  Activity: Advance as tolerated. Pelvic rest for 6 weeks.   Outpatient follow up:6 weeks Follow up Appt:No future appointments. Follow up  Visit:   Please schedule this patient for Postpartum visit in: 6 weeks with the following provider: MD For C/S patients schedule nurse  incision check in weeks 2 weeks: yes High risk pregnancy complicated by: GMDA2, gHTN Delivery mode:  CS Anticipated Birth Control:  Condoms PP Procedures needed: 1wk wound vac check, 2 wk incision check, 6 wk 2hr GTT  Schedule Integrated BH visit: no      Newborn Data: Live born female  Birth Weight: 7 lb 6.9 oz (3370 g) APGAR: 8, 9  Newborn Delivery   Birth date/time: 10/09/2019 17:15:00 Delivery type: C-Section, Vacuum Assisted Trial of labor: Yes C-section categorization: Primary      Baby Feeding: Breast Disposition:home with mother   10/11/2019 Clarnce Flock, MD

## 2019-10-12 ENCOUNTER — Other Ambulatory Visit: Payer: Self-pay | Admitting: Obstetrics

## 2019-10-12 DIAGNOSIS — M549 Dorsalgia, unspecified: Secondary | ICD-10-CM

## 2019-10-12 LAB — SURGICAL PATHOLOGY

## 2019-10-12 MED ORDER — COMFORT FIT MATERNITY SUPP SM MISC: 0 refills | Status: DC

## 2019-10-17 ENCOUNTER — Inpatient Hospital Stay (HOSPITAL_COMMUNITY)
Admission: AD | Admit: 2019-10-17 | Discharge: 2019-10-17 | Disposition: A | Discharge status: Home / Self Care | Payer: BC Managed Care – PPO | Attending: Obstetrics and Gynecology | Admitting: Obstetrics and Gynecology

## 2019-10-17 ENCOUNTER — Other Ambulatory Visit: Payer: Self-pay

## 2019-10-17 DIAGNOSIS — O8600 Infection of obstetric surgical wound, unspecified: Secondary | ICD-10-CM | POA: Diagnosis not present

## 2019-10-17 DIAGNOSIS — Z79899 Other long term (current) drug therapy: Secondary | ICD-10-CM | POA: Insufficient documentation

## 2019-10-17 DIAGNOSIS — O9089 Other complications of the puerperium, not elsewhere classified: Secondary | ICD-10-CM | POA: Diagnosis present

## 2019-10-17 DIAGNOSIS — Z885 Allergy status to narcotic agent status: Secondary | ICD-10-CM | POA: Insufficient documentation

## 2019-10-17 NOTE — MAU Provider Note (Signed)
None     Chief Complaint:  Wound vac stopped  Maureen Lewis is  23 y.o. G1P1001. She presents complaining of No chief complaint on file. . She had a primary CS on 12/22 for failed IOL (for A2DM and GHTN).  C/O wound vac not working and not being able to get in touch w/OB.  Incision site burns and itches. Takes BP at home several times a day and it runs 124-140's/70's-80's.  On Norvasc '10mg'$  qd.  Past Medical History:  Diagnosis Date  . Abnormal uterine bleeding   . Acid reflux   . Alpha thalassemia trait   . Amenorrhea   . Anemia    no current med.  . Complication of anesthesia    states had to keep giving her anesthesia during EGD  . Constipation   . Depression    "I'm good"  . Finger mass, left 03/2017   middle finger  . Gestational diabetes   . Headache   . Infection    UTI  . Irritable bowel syndrome (IBS)   . Morbid obesity with body mass index (BMI) of 45.0 to 49.9 in adult [facility])   . Plantar fasciitis, bilateral   . Polycystic ovary syndrome   . Prediabetes    "prediabetes"  . Pregnancy induced hypertension   . Shortness of breath   . Sleep apnea    no CPAP use    Past Surgical History:  Procedure Laterality Date  . CESAREAN SECTION N/A 10/09/2019   Procedure: CESAREAN SECTION;  Surgeon: Aletha Halim, MD;  Location: [facility] LD ORS;  Service: Obstetrics;  Laterality: N/A;  . COLONOSCOPY WITH PROPOFOL  10/07/2016  . ESOPHAGOGASTRODUODENOSCOPY  12/21/2015  . EXCISION MASS UPPER EXTREMETIES Left 04/21/2017   Procedure: EXCISION MASS LEFT MIDDLE FINGER;  Surgeon: Daryll Brod, MD;  Location: [place];  Service: Orthopedics;  Laterality: Left;    Family History  Problem Relation Age of Onset  . Diabetes Maternal Aunt   . Hypertension Maternal Uncle   . Depression Maternal Grandfather   . Diabetes Maternal Grandfather   . Hypertension Maternal Grandfather   . Arthritis Mother   . High blood pressure Mother   . Depression Mother   . Sleep apnea  Mother   . Obesity Mother     Social History   Tobacco Use  . Smoking status: Never Smoker  . Smokeless tobacco: Never Used  Substance Use Topics  . Alcohol use: Not Currently    Comment: social   . Drug use: No    Allergies:  Allergies  Allergen Reactions  . Bee Pollen Hives, Shortness Of Breath and Swelling  . Bee Venom Hives, Shortness Of Breath and Swelling  . Hydrocodone-Acetaminophen Anaphylaxis    No problem when she takes Tylenol  . Peach Flavor Hives, Shortness Of Breath and Other (See Comments)    SWELLING OF MOUTH  . Pollen Extract Hives, Shortness Of Breath and Swelling    Medications Prior to Admission  Medication Sig Dispense Refill Last Dose  . amLODipine (NORVASC) 10 MG tablet Take 1 tablet (10 mg total) by mouth daily. 30 tablet 2 10/17/2019 at Unknown time  . Blood Pressure Monitoring KIT 1 kit by Does not apply route once a week. 1 kit 0   . calcium carbonate (TUMS - DOSED IN MG ELEMENTAL CALCIUM) 500 MG chewable tablet Chew 2 tablets by mouth as needed for indigestion or heartburn.     . cyclobenzaprine (FLEXERIL) 10 MG tablet Take 1 tablet (10 mg  total) by mouth 2 (two) times daily as needed for muscle spasms. 60 tablet 0   . Elastic Bandages & Supports (COMFORT FIT MATERNITY SUPP SM) MISC Wear as directed. 1 each 0   . ferrous sulfate 325 (65 FE) MG tablet Take 1 tablet (325 mg total) by mouth every other day. 30 tablet 2   . ibuprofen (ADVIL) 800 MG tablet Take 1 tablet (800 mg total) by mouth every 8 (eight) hours. 30 tablet 0   . loratadine (CLARITIN) 10 MG tablet Take 10 mg by mouth daily as needed for allergies.      Marland Kitchen ondansetron (ZOFRAN ODT) 4 MG disintegrating tablet Take 1 tablet (4 mg total) by mouth every 8 (eight) hours as needed for nausea or vomiting. 30 tablet 1   . oxyCODONE-acetaminophen (PERCOCET/ROXICET) 5-325 MG tablet Take 2 tablets by mouth every 4 (four) hours as needed for moderate pain or severe pain. 30 tablet 0   . Prenatal  Vit-Fe Fumarate-FA (PRENATAL VITAMINS PO) Take by mouth.        Review of Systems   Constitutional: Negative for fever and chills Eyes: Negative for visual disturbances Respiratory: Negative for shortness of breath, dyspnea Cardiovascular: Negative for chest pain or palpitations  Gastrointestinal: Negative for vomiting, diarrhea and constipation Genitourinary: Negative for dysuria and urgency Musculoskeletal: Negative for back pain, joint pain, myalgias  Neurological: Negative for dizziness and headaches     Physical Exam   Blood pressure (!) 153/102, pulse 79, resp. rate (!) 22, height 6' 2.5" (1.892 m), weight (!) 184.5 kg, SpO2 99 %, unknown if currently breastfeeding.  142/90 General: General appearance - alert, well appearing, and in no distress and oriented to person, place, and time Chest - normal respiratory effort Heart - normal rate and regular rhythm Abdomen - wound vac removed.  Incision looks pristine, no erythema, drainage. Extremities - no pedal edema noted   Labs: No results found for this or any previous visit (from the past 24 hour(s)). maging Studies:     Assessment: Incision check, normal. Wound vac no longer necessary GHTN, on meds  Plan: Continue to take BP a few times a day, bring to MD visit on Wednesday as scheduled.   Christin Fudge

## 2019-10-17 NOTE — MAU Note (Signed)
Patient presents to MAU c/o pp c/s wound vac not working. Patient reports burning and itching at incision site. Patient unsure of drainage. She states " I can't see it." Patient states she has an appointment with her doctor on Wednesday and she tried calling her provider tonight and they haven't called her back.

## 2019-10-19 ENCOUNTER — Ambulatory Visit (INDEPENDENT_AMBULATORY_CARE_PROVIDER_SITE_OTHER): Payer: BC Managed Care – PPO | Admitting: Obstetrics & Gynecology

## 2019-10-19 ENCOUNTER — Other Ambulatory Visit: Payer: Self-pay

## 2019-10-19 ENCOUNTER — Encounter: Payer: Self-pay | Admitting: Obstetrics & Gynecology

## 2019-10-19 VITALS — BP 155/97 | HR 83 | Ht 74.5 in | Wt 394.6 lb

## 2019-10-19 DIAGNOSIS — Z9889 Other specified postprocedural states: Secondary | ICD-10-CM

## 2019-10-19 DIAGNOSIS — O165 Unspecified maternal hypertension, complicating the puerperium: Secondary | ICD-10-CM

## 2019-10-19 MED ORDER — AMLODIPINE BESYLATE 10 MG PO TABS: 20.0000 mg | ORAL_TABLET | Freq: Every day | ORAL | 2 refills | Status: DC

## 2019-10-19 NOTE — Progress Notes (Signed)
Pt presents for incision check. Pt states that she MAU on Monday night and wound vac was removed. Pt has no concerns about incision. She does not report any odor, redness, or drainage. She also denies pain, but states that she has some soreness. Patient does describes the area as warm to touch.  Incision is clean, dry, and intact with steri strips in place. Patient advised to contact office if she develops increased pain, redness, drainage, odor, or if wound starts to open for evaluation. Patient will follow up on 1/13 for incision check and bp check with nurse per provider orders.  Attestation of Attending Supervision of CMA/RN: Evaluation and management procedures were performed by the nurse under my supervision and collaboration.  I have reviewed the nursing note and chart, and I agree with the management and plan.  Carolyn L. Harraway-Smith, M.D., Cherlynn June

## 2019-10-19 NOTE — Addendum Note (Signed)
Addended by: Lavonia Drafts on: 10/19/2019 02:45 PM   Modules accepted: Orders

## 2019-10-27 ENCOUNTER — Other Ambulatory Visit: Payer: Self-pay | Admitting: Obstetrics & Gynecology

## 2019-10-27 DIAGNOSIS — O165 Unspecified maternal hypertension, complicating the puerperium: Secondary | ICD-10-CM

## 2019-10-27 MED ORDER — AMLODIPINE BESYLATE 10 MG PO TABS: 10.0000 mg | ORAL_TABLET | Freq: Every day | ORAL | 2 refills | Status: DC

## 2019-10-27 MED ORDER — HYDROCHLOROTHIAZIDE 25 MG PO TABS: 25.0000 mg | ORAL_TABLET | Freq: Every day | ORAL | 2 refills | Status: DC

## 2019-11-01 ENCOUNTER — Ambulatory Visit: Payer: Self-pay

## 2019-11-01 NOTE — Lactation Note (Signed)
This note was copied from a baby's chart. Lactation Consultation Note  Patient Name: Maureen Lewis Today's Date: 11/01/2019     Maternal Data  Mother states no breast changes in pregnancy. She had a 72 hour labor that was induced and ended in an emergency c-section. Gestational diabetes insulin dependent and gestational hypertension during pregnancy.  Feeding  Maureen Lewis was weighed before breastfeeding. Her weight was 4152 grams (9 lbs 2.4 oz). Maureen Lewis was fussy prior to latching and was given an ounce of formula. Once settled Maureen Lewis attempted to latch with and without a nipple shield. Maureen Lewis latched at times but could not maintain the latch and other times struggled to latch.  LATCH Score                   Interventions    Lactation Tools Discussed/Used  Nipple shield, slow flow nipple, Double electric breast pump.    Consult Status  Mother Risinger) is here with Maureen Lewis for a lactation evaluation. Mother states that Maureen Lewis struggles to latch and she is concerned about her low milk supply. Maureen Lewis also struggled to latch during the consultation and after an oral assessment of her mouth, LC could see a tight frenulum under her upper lip and under the tongue. LC used a gloved finger to assess latch. As Maureen Lewis began to suck, she started to click a little and could not properly seal around finger. She has a slight blister on her upper lip. Mother was given information on local resources for oral restrictions for an evaluation if she chooses. Mother was told that her low milk supply could be contributed to lack of breast changes in pregnancy.   Lactation plan is for mother to attempt to latch and breastfeed using the nipple shield. Mother is instructed to not attempt for more than 5 to 10 minutes. If Maureen Lewis, she should not breastfeed for longer than 30 minutes to prevent her from overworking herself. Mother should continue to power pump 2-3 times but continue to  pump every 2-3 hours or after bottle feeding Maureen Lewis. Mother instructed to continue a well balanced diet increasing calories by at least 500 extra daily and to continue to stay hydrated, and take prenatal vitamins as instructed by her doctor.     Maureen Lewis [id number], 10:16 AM

## 2019-11-02 ENCOUNTER — Other Ambulatory Visit: Payer: Self-pay

## 2019-11-02 ENCOUNTER — Ambulatory Visit (INDEPENDENT_AMBULATORY_CARE_PROVIDER_SITE_OTHER): Payer: BC Managed Care – PPO

## 2019-11-02 VITALS — BP 135/77 | HR 89 | Ht 74.0 in | Wt 394.0 lb

## 2019-11-02 DIAGNOSIS — O165 Unspecified maternal hypertension, complicating the puerperium: Secondary | ICD-10-CM

## 2019-11-02 NOTE — Progress Notes (Signed)
Subjective:  Maureen Lewis is a 24 y.o. female here for BP check.   Hypertension ROS: taking medications as instructed, no medication side effects noted, no TIA's, no chest pain on exertion, no dyspnea on exertion and no swelling of ankles.    Objective:  BP 135/77   Pulse 89   Ht 6\' 2"  (1.88 m)   Wt (!) 394 lb (178.7 kg)   Breastfeeding No   BMI 50.59 kg/m   Appearance alert, well appearing, and in no distress. General exam BP noted to be well controlled today in office.    Assessment:   Blood Pressure well controlled.   Plan:  Current treatment plan is effective, no change in therapy.per Dr. Jodi Mourning.     __________________________________  Subjective:     Maureen Lewis is a 24 y.o. female who presents to the clinic 3 weeks status post LTCS for PP Incision Check. Eating a regular diet without difficulty. Bowel movements are normal. The patient is not having any pain.  Denies tenderness, drainage, fever, chills.  The following portions of the patient's history were reviewed and updated as appropriate: allergies, current medications, past family history, past medical history, past social history, past surgical history and problem list.  Review of Systems Pertinent items are noted in HPI.    Objective:    BP 135/77   Pulse 89   Ht 6\' 2"  (1.88 m)   Wt (!) 394 lb (178.7 kg)   Breastfeeding No   BMI 50.59 kg/m  General:  alert  Abdomen: soft, bowel sounds active, non-tender  Incision:   healing well, no drainage, no erythema, no hernia, no seroma, no swelling, no dehiscence, incision well approximated     Assessment:    Doing well postoperatively.   Plan:    1. Continue any current medications. 2. Wound care discussed. 3. Activity restrictions: none 4. Anticipated return to work: not applicable. 5. Follow up: 3 weeks for PP visit. Keep     appt.  Tamela Oddi, RMA

## 2019-11-22 ENCOUNTER — Other Ambulatory Visit: Payer: BC Managed Care – PPO

## 2019-11-22 ENCOUNTER — Ambulatory Visit (INDEPENDENT_AMBULATORY_CARE_PROVIDER_SITE_OTHER): Payer: BC Managed Care – PPO | Admitting: Obstetrics and Gynecology

## 2019-11-22 ENCOUNTER — Encounter: Payer: Self-pay | Admitting: Obstetrics and Gynecology

## 2019-11-22 ENCOUNTER — Other Ambulatory Visit: Payer: Self-pay

## 2019-11-22 DIAGNOSIS — Z30013 Encounter for initial prescription of injectable contraceptive: Secondary | ICD-10-CM | POA: Diagnosis not present

## 2019-11-22 DIAGNOSIS — Z1389 Encounter for screening for other disorder: Secondary | ICD-10-CM | POA: Diagnosis not present

## 2019-11-22 DIAGNOSIS — Z3202 Encounter for pregnancy test, result negative: Secondary | ICD-10-CM

## 2019-11-22 DIAGNOSIS — Z309 Encounter for contraceptive management, unspecified: Secondary | ICD-10-CM | POA: Insufficient documentation

## 2019-11-22 LAB — POCT URINE PREGNANCY: Preg Test, Ur: NEGATIVE

## 2019-11-22 MED ORDER — MEDROXYPROGESTERONE ACETATE 150 MG/ML IM SUSP
150.0000 mg | Freq: Once | INTRAMUSCULAR | Status: AC
  Administered 2019-11-22: 150 mg via INTRAMUSCULAR

## 2019-11-22 MED ORDER — MEDROXYPROGESTERONE ACETATE 150 MG/ML IM SUSP: 150.0000 mg | INTRAMUSCULAR | 3 refills | Status: DC

## 2019-11-22 NOTE — Progress Notes (Signed)
Post Partum Exam  Maureen Lewis is a 24 y.o. G74P1001 female who presents for a postpartum visit. She is 6 weeks postpartum following a low cervical transverse Cesarean section. IOL due to [facility] and A2DM, C section d/t FTP. No BP meds postpartum. I have fully reviewed the prenatal and intrapartum course. The delivery was at 37.2 gestational weeks.  Anesthesia: epidural. Postpartum course has been doing well. Baby's course has been doing well. Baby is feeding by bottle Dory Horn soothe. Bleeding no bleeding. Bowel function is normal. Bladder function is normal. Patient is not sexually active. Contraception method is abstinence. Interested in Depo Provera. Postpartum depression screening:neg, score 0.  The following portions of the patient's history were reviewed and updated as appropriate: allergies, current medications, past family history, past medical history, past social history, past surgical history and problem list.   Review of Systems Pertinent items noted in HPI and remainder of comprehensive ROS otherwise negative.    Objective:  not currently breastfeeding.  General:  alert   Breasts:  not evaluated  Lungs: clear to auscultation bilaterally  Heart:  regular rate and rhythm, S1, S2 normal, no murmur, click, rub or gallop  Abdomen: soft, non-tender; bowel sounds normal; no masses,  no organomegaly, incision well healed   Vulva:  not evaluated  Vagina: not evaluated  Cervix:  not evaluated  Corpus: not examined  Adnexa:  not evaluated  Rectal Exam: Not performed.        Assessment:    Nl postpartum exam.   H/O GDM H/O GHTN Contraception management  Plan:   1. Contraception: Depo-Provera injections. First injection today. U/R/B/Back up method reviewed. Rx to pharmacy 2. Return to nl ADL's BP normotensive without meds. 2 hr Glucola today 3. Follow up in: q 12 weeks for Depo Provera, 1 yr for yearly GYN exam and pap smear, or PRN

## 2019-11-22 NOTE — Progress Notes (Addendum)
Pt received office supply Depo injection at today's visit. Pt tolerated injection well.  Pt advised of depo scheduling and policy.  Administrations This Visit    medroxyPROGESTERone (DEPO-PROVERA) injection 150 mg    Admin Date 11/22/2019 Action Given Dose 150 mg Route Intramuscular Administered By Valene Bors, CMA

## 2019-11-22 NOTE — Addendum Note (Signed)
Addended by: Lewie Loron D on: 11/22/2019 09:40 AM   Modules accepted: Orders

## 2019-11-22 NOTE — Patient Instructions (Signed)
Health Maintenance, Female Adopting a healthy lifestyle and getting preventive care are important in promoting health and wellness. Ask your health care provider about:  The right schedule for you to have regular tests and exams.  Things you can do on your own to prevent diseases and keep yourself healthy. What should I know about diet, weight, and exercise? Eat a healthy diet   Eat a diet that includes plenty of vegetables, fruits, low-fat dairy products, and lean protein.  Do not eat a lot of foods that are high in solid fats, added sugars, or sodium. Maintain a healthy weight Body mass index (BMI) is used to identify weight problems. It estimates body fat based on height and weight. Your health care provider can help determine your BMI and help you achieve or maintain a healthy weight. Get regular exercise Get regular exercise. This is one of the most important things you can do for your health. Most adults should:  Exercise for at least 150 minutes each week. The exercise should increase your heart rate and make you sweat (moderate-intensity exercise).  Do strengthening exercises at least twice a week. This is in addition to the moderate-intensity exercise.  Spend less time sitting. Even light physical activity can be beneficial. Watch cholesterol and blood lipids Have your blood tested for lipids and cholesterol at 22 years of age, then have this test every 5 years. Have your cholesterol levels checked more often if:  Your lipid or cholesterol levels are high.  You are older than 22 years of age.  You are at high risk for heart disease. What should I know about cancer screening? Depending on your health history and family history, you may need to have cancer screening at various ages. This may include screening for:  Breast cancer.  Cervical cancer.  Colorectal cancer.  Skin cancer.  Lung cancer. What should I know about heart disease, diabetes, and high blood  pressure? Blood pressure and heart disease  High blood pressure causes heart disease and increases the risk of stroke. This is more likely to develop in people who have high blood pressure readings, are of African descent, or are overweight.  Have your blood pressure checked: ? Every 3-5 years if you are 18-39 years of age. ? Every year if you are 40 years old or older. Diabetes Have regular diabetes screenings. This checks your fasting blood sugar level. Have the screening done:  Once every three years after age 40 if you are at a normal weight and have a low risk for diabetes.  More often and at a younger age if you are overweight or have a high risk for diabetes. What should I know about preventing infection? Hepatitis B If you have a higher risk for hepatitis B, you should be screened for this virus. Talk with your health care provider to find out if you are at risk for hepatitis B infection. Hepatitis C Testing is recommended for:  Everyone born from 1945 through 1965.  Anyone with known risk factors for hepatitis C. Sexually transmitted infections (STIs)  Get screened for STIs, including gonorrhea and chlamydia, if: ? You are sexually active and are younger than 22 years of age. ? You are older than 22 years of age and your health care provider tells you that you are at risk for this type of infection. ? Your sexual activity has changed since you were last screened, and you are at increased risk for chlamydia or gonorrhea. Ask your health care provider if   you are at risk.  Ask your health care provider about whether you are at high risk for HIV. Your health care provider may recommend a prescription medicine to help prevent HIV infection. If you choose to take medicine to prevent HIV, you should first get tested for HIV. You should then be tested every 3 months for as long as you are taking the medicine. Pregnancy  If you are about to stop having your period (premenopausal) and  you may become pregnant, seek counseling before you get pregnant.  Take 400 to 800 micrograms (mcg) of folic acid every day if you become pregnant.  Ask for birth control (contraception) if you want to prevent pregnancy. Osteoporosis and menopause Osteoporosis is a disease in which the bones lose minerals and strength with aging. This can result in bone fractures. If you are 65 years old or older, or if you are at risk for osteoporosis and fractures, ask your health care provider if you should:  Be screened for bone loss.  Take a calcium or vitamin D supplement to lower your risk of fractures.  Be given hormone replacement therapy (HRT) to treat symptoms of menopause. Follow these instructions at home: Lifestyle  Do not use any products that contain nicotine or tobacco, such as cigarettes, e-cigarettes, and chewing tobacco. If you need help quitting, ask your health care provider.  Do not use street drugs.  Do not share needles.  Ask your health care provider for help if you need support or information about quitting drugs. Alcohol use  Do not drink alcohol if: ? Your health care provider tells you not to drink. ? You are pregnant, may be pregnant, or are planning to become pregnant.  If you drink alcohol: ? Limit how much you use to 0-1 drink a day. ? Limit intake if you are breastfeeding.  Be aware of how much alcohol is in your drink. In the [place] one drink equals one 12 oz bottle of beer (355 mL), one 5 oz glass of wine (148 mL), or one 1 oz glass of hard liquor (44 mL). General instructions  Schedule regular health, dental, and eye exams.  Stay current with your vaccines.  Tell your health care provider if: ? You often feel depressed. ? You have ever been abused or do not feel safe at home. Summary  Adopting a healthy lifestyle and getting preventive care are important in promoting health and wellness.  Follow your health care provider's instructions about healthy  diet, exercising, and getting tested or screened for diseases.  Follow your health care provider's instructions on monitoring your cholesterol and blood pressure. This information is not intended to replace advice given to you by your health care provider. Make sure you discuss any questions you have with your health care provider. Document Revised: 09/29/2018 Document Reviewed: 09/29/2018 Elsevier Patient Education  2020 Elsevier Inc.  

## 2019-11-23 LAB — GLUCOSE TOLERANCE, 2 HOURS
Glucose, 2 hour: 90 mg/dL (ref 65–139)
Glucose, GTT - Fasting: 97 mg/dL (ref 65–99)

## 2019-12-12 ENCOUNTER — Telehealth: Payer: Self-pay | Admitting: Lactation Services

## 2019-12-12 NOTE — Telephone Encounter (Signed)
Mom called today at 17:03 pm.  Her baby, Maureen Lewis, born 10/09/2017, was seen as an out patient by Elvera Lennox.  She was evaluated for tongue tie.  She requests that her consult notes/documents be emailed to Dr. Audie Pinto.  Elmo Putt will be working Architectural technologist.  Will pass on this information to her.  Told mom to call in the morning if Elmo Putt did not call her.  [place] phone number and office phone number passed on to mom.

## 2020-01-02 ENCOUNTER — Encounter: Payer: Self-pay | Admitting: Certified Nurse Midwife

## 2020-01-04 ENCOUNTER — Ambulatory Visit (INDEPENDENT_AMBULATORY_CARE_PROVIDER_SITE_OTHER): Payer: BC Managed Care – PPO

## 2020-01-04 ENCOUNTER — Other Ambulatory Visit: Payer: Self-pay

## 2020-01-04 ENCOUNTER — Encounter: Payer: Self-pay | Admitting: Certified Nurse Midwife

## 2020-01-04 VITALS — BP 119/65 | HR 77 | Wt >= 6400 oz

## 2020-01-04 DIAGNOSIS — Z113 Encounter for screening for infections with a predominantly sexual mode of transmission: Secondary | ICD-10-CM | POA: Diagnosis not present

## 2020-01-04 DIAGNOSIS — N898 Other specified noninflammatory disorders of vagina: Secondary | ICD-10-CM | POA: Diagnosis not present

## 2020-01-04 NOTE — Progress Notes (Signed)
Pt is here today for STD testing. Pt reports that she has had vaginal discharge and irritation for about 2 weeks. Pt reports she has a new sexual partner and would like to be tested for all STD's. Pt is not currently breastfeeding. Advised pt we will let her know once results are in, pt voices understanding.

## 2020-01-05 ENCOUNTER — Other Ambulatory Visit: Payer: Self-pay | Admitting: Obstetrics

## 2020-01-05 DIAGNOSIS — A599 Trichomoniasis, unspecified: Secondary | ICD-10-CM

## 2020-01-05 DIAGNOSIS — A749 Chlamydial infection, unspecified: Secondary | ICD-10-CM

## 2020-01-05 LAB — CERVICOVAGINAL ANCILLARY ONLY
Bacterial Vaginitis (gardnerella): NEGATIVE
Candida Glabrata: NEGATIVE
Candida Vaginitis: NEGATIVE
Chlamydia: POSITIVE — AB
Comment: NEGATIVE
Comment: NEGATIVE
Comment: NEGATIVE
Comment: NEGATIVE
Comment: NEGATIVE
Comment: NORMAL
Neisseria Gonorrhea: NEGATIVE
Trichomonas: POSITIVE — AB

## 2020-01-05 LAB — HEPATITIS C ANTIBODY: Hep C Virus Ab: <0.1 s/co ratio (ref 0.0–0.9)

## 2020-01-05 LAB — RPR: RPR Ser Ql: NONREACTIVE

## 2020-01-05 LAB — HIV ANTIBODY (ROUTINE TESTING W REFLEX): HIV Screen 4th Generation wRfx: NONREACTIVE

## 2020-01-05 LAB — HEPATITIS B SURFACE ANTIGEN: Hepatitis B Surface Ag: NEGATIVE

## 2020-01-05 MED ORDER — DOXYCYCLINE HYCLATE 100 MG PO CAPS: 100.0000 mg | ORAL_CAPSULE | Freq: Two times a day (BID) | ORAL | 0 refills | Status: DC

## 2020-01-05 MED ORDER — METRONIDAZOLE 500 MG PO TABS: 500.0000 mg | ORAL_TABLET | Freq: Two times a day (BID) | ORAL | 2 refills | Status: DC

## 2020-01-18 ENCOUNTER — Other Ambulatory Visit: Payer: Self-pay

## 2020-01-18 ENCOUNTER — Encounter: Payer: Self-pay | Admitting: Adult Health

## 2020-01-18 MED ORDER — IBUPROFEN 600 MG PO TABS: 600.0000 mg | ORAL_TABLET | Freq: Three times a day (TID) | ORAL | 0 refills | Status: DC | PRN

## 2020-01-24 ENCOUNTER — Other Ambulatory Visit: Payer: Self-pay | Admitting: *Deleted

## 2020-01-24 MED ORDER — TERCONAZOLE 0.8 % VA CREA: 1.0000 | TOPICAL_CREAM | Freq: Every day | VAGINAL | 0 refills | Status: DC

## 2020-01-24 NOTE — Progress Notes (Signed)
Pt called to office with vaginal itching. Pt states she recently took antibiotics for 2 infections, ?yeast inf.  Pt made aware treatment could be sent today for yeast. Terazol sent to pharmacy today.  Pt advised to call if no change in symptoms after treatment.

## 2020-02-02 ENCOUNTER — Telehealth (INDEPENDENT_AMBULATORY_CARE_PROVIDER_SITE_OTHER): Payer: BC Managed Care – PPO | Admitting: Adult Health

## 2020-02-02 ENCOUNTER — Ambulatory Visit: Payer: BC Managed Care – PPO | Admitting: Adult Health

## 2020-02-02 ENCOUNTER — Other Ambulatory Visit: Payer: Self-pay

## 2020-02-02 ENCOUNTER — Encounter: Payer: Self-pay | Admitting: Adult Health

## 2020-02-02 VITALS — Wt >= 6400 oz

## 2020-02-02 DIAGNOSIS — Z8669 Personal history of other diseases of the nervous system and sense organs: Secondary | ICD-10-CM | POA: Diagnosis not present

## 2020-02-02 MED ORDER — TOPIRAMATE 25 MG PO TABS: 25.0000 mg | ORAL_TABLET | Freq: Two times a day (BID) | ORAL | 1 refills | Status: DC

## 2020-02-02 NOTE — Progress Notes (Signed)
Virtual Visit via Telephone Note  I connected with Maureen Lewis on 02/02/20 at  3:30 PM EDT by telephone and verified that I am speaking with the correct person using two identifiers.   I discussed the limitations, risks, security and privacy concerns of performing an evaluation and management service by telephone and the availability of in person appointments. I also discussed with the patient that there may be a patient responsible charge related to this service. The patient expressed understanding and agreed to proceed.  Location patient: home Location provider: work or home office Participants present for the call: patient, provider Patient did not have a visit in the prior 7 days to address this/these issue(s).   History of Present Illness: 24 year old female who  has a past medical history of Abnormal uterine bleeding, Acid reflux, Alpha thalassemia trait, Amenorrhea, Anemia, Cesarean delivery delivered ([id number]), Complication of anesthesia, Constipation, Depression, Finger mass, left (03/2017), Gestational diabetes, Gestational diabetes (05/23/2019), Gestational hypertension (09/23/2019), Headache, Infection, Irritable bowel syndrome (IBS), Morbid obesity with body mass index (BMI) of 45.0 to 49.9 in adult [facility]), Obesity during pregnancy, antepartum (04/06/2019), Plantar fasciitis, bilateral, Polycystic ovary syndrome, Prediabetes, Pregnancy induced hypertension, Shortness of breath, Sleep apnea, and Supervision of normal first pregnancy (04/06/2019).  She is being evaluated today for concern of recurrent headaches.  In the past she has been seen by neurology and prescribed Topamax which she reports benefited her when she was on it back in 2018 and 2019.  Over the last week she has been experiencing a pretty constant unilateral headache, blurred vision, dizziness, and presyncopal episodes.  She denies syncope.  She tried to see her neurologist but was told that she would need to reestablish care  and needs a new referral from her PCP.  He denies nausea, vomiting, shortness of breath, or chest pain.  She is currently on blood pressure medication Norvasc and HCTZ which is prescribed for her by her OB/GYN as she developed hypertension during pregnancy.  She reports that her blood pressures are very well controlled at home and has not had any episodes of hypotension.  She feels as though her symptoms are similar to that of what she was experiencing back in 2017 and 2018.  She would like to get a referral back to see her neurologist that she would like to continue follow-ups with them as well.   Observations/Objective: Patient sounds cheerful and well on the phone. I do not appreciate any SOB. Speech and thought processing are grossly intact. Patient reported vitals:  Assessment and Plan: 1. Hx of migraine headaches -We will put her back on Topamax 25 mg twice daily and refer her to neurology to reestablish care. - topiramate (TOPAMAX) 25 MG tablet; Take 1 tablet (25 mg total) by mouth 2 (two) times daily.  Dispense: 90 tablet; Refill: 1 - Ambulatory referral to Neurology - Follow up if no improvement in the next week or sooner if symptoms do not improve     Follow Up Instructions:  I did not refer this patient for an OV in the next 24 hours for this/these issue(s).  I discussed the assessment and treatment plan with the patient. The patient was provided an opportunity to ask questions and all were answered. The patient agreed with the plan and demonstrated an understanding of the instructions.   The patient was advised to call back or seek an in-person evaluation if the symptoms worsen or if the condition fails to improve as anticipated.  I provided 13  minutes of non-face-to-face time during this encounter.   Dorothyann Peng, NP

## 2020-02-14 ENCOUNTER — Other Ambulatory Visit (HOSPITAL_COMMUNITY)
Admission: RE | Admit: 2020-02-14 | Discharge: 2020-02-14 | Disposition: A | Discharge status: Home / Self Care | Payer: BC Managed Care – PPO | Source: Ambulatory Visit | Attending: Obstetrics and Gynecology | Admitting: Obstetrics and Gynecology

## 2020-02-14 ENCOUNTER — Ambulatory Visit (INDEPENDENT_AMBULATORY_CARE_PROVIDER_SITE_OTHER): Payer: BC Managed Care – PPO | Admitting: *Deleted

## 2020-02-14 ENCOUNTER — Other Ambulatory Visit: Payer: Self-pay

## 2020-02-14 DIAGNOSIS — Z8619 Personal history of other infectious and parasitic diseases: Secondary | ICD-10-CM

## 2020-02-14 DIAGNOSIS — B373 Candidiasis of vulva and vagina: Secondary | ICD-10-CM | POA: Insufficient documentation

## 2020-02-14 DIAGNOSIS — N898 Other specified noninflammatory disorders of vagina: Secondary | ICD-10-CM | POA: Insufficient documentation

## 2020-02-14 DIAGNOSIS — Z3042 Encounter for surveillance of injectable contraceptive: Secondary | ICD-10-CM | POA: Diagnosis not present

## 2020-02-14 MED ORDER — MEDROXYPROGESTERONE ACETATE 150 MG/ML IM SUSP
150.0000 mg | Freq: Once | INTRAMUSCULAR | Status: AC
  Administered 2020-02-14: 150 mg via INTRAMUSCULAR

## 2020-02-14 NOTE — Progress Notes (Signed)
Pt is in office or Depo injection. Pt is on time for Depo.  Injection given, pt tolerated well.  Pt made aware next depo due July 13-27.  Administrations This Visit    medroxyPROGESTERone (DEPO-PROVERA) injection 150 mg    Admin Date 02/14/2020 Action Given Dose 150 mg Route Intramuscular Administered By Lewie Loron D, CMA          Pt would also self swab today for vaginal itching.  Pt was +CH and +Trich on last swab.  Pt states she was treated for both. Pt states she is still having some vaginal itching/burning.   Pt preformed self swab and made aware will await results for treatment.

## 2020-02-15 LAB — CERVICOVAGINAL ANCILLARY ONLY
Bacterial Vaginitis (gardnerella): NEGATIVE
Candida Glabrata: NEGATIVE
Candida Vaginitis: POSITIVE — AB
Chlamydia: NEGATIVE
Comment: NEGATIVE
Comment: NEGATIVE
Comment: NEGATIVE
Comment: NEGATIVE
Comment: NEGATIVE
Comment: NORMAL
Neisseria Gonorrhea: NEGATIVE
Trichomonas: NEGATIVE

## 2020-02-15 MED ORDER — FLUCONAZOLE 150 MG PO TABS: 150.0000 mg | ORAL_TABLET | Freq: Once | ORAL | 0 refills | Status: AC

## 2020-02-15 NOTE — Addendum Note (Signed)
Addended by: Mora Bellman on: 02/15/2020 04:09 PM   Modules accepted: Orders

## 2020-02-15 NOTE — Progress Notes (Signed)
Patient seen and assessed by nursing staff during this encounter. I have reviewed the chart and agree with the documentation and plan. I have also made any necessary editorial changes.  Mora Bellman, MD 02/15/2020 4:18 PM

## 2020-02-16 ENCOUNTER — Emergency Department (HOSPITAL_COMMUNITY): Payer: BC Managed Care – PPO

## 2020-02-16 ENCOUNTER — Emergency Department (HOSPITAL_COMMUNITY)
Admission: EM | Admit: 2020-02-16 | Discharge: 2020-02-17 | Disposition: A | Discharge status: Home / Self Care | Payer: BC Managed Care – PPO | Attending: Emergency Medicine | Admitting: Emergency Medicine

## 2020-02-16 ENCOUNTER — Other Ambulatory Visit: Payer: Self-pay

## 2020-02-16 ENCOUNTER — Encounter (HOSPITAL_COMMUNITY): Payer: Self-pay | Admitting: Emergency Medicine

## 2020-02-16 DIAGNOSIS — R079 Chest pain, unspecified: Secondary | ICD-10-CM | POA: Diagnosis not present

## 2020-02-16 DIAGNOSIS — I10 Essential (primary) hypertension: Secondary | ICD-10-CM | POA: Insufficient documentation

## 2020-02-16 DIAGNOSIS — R0789 Other chest pain: Secondary | ICD-10-CM | POA: Diagnosis not present

## 2020-02-16 DIAGNOSIS — Z79899 Other long term (current) drug therapy: Secondary | ICD-10-CM | POA: Insufficient documentation

## 2020-02-16 DIAGNOSIS — R0602 Shortness of breath: Secondary | ICD-10-CM | POA: Diagnosis not present

## 2020-02-16 LAB — CBC
HCT: 39.4 % (ref 36.0–46.0)
Hemoglobin: 11.7 g/dL — ABNORMAL LOW (ref 12.0–15.0)
MCH: 21 pg — ABNORMAL LOW (ref 26.0–34.0)
MCHC: 29.7 g/dL — ABNORMAL LOW (ref 30.0–36.0)
MCV: 70.7 fL — ABNORMAL LOW (ref 80.0–100.0)
Platelets: 304 10*3/uL (ref 150–400)
RBC: 5.57 MIL/uL — ABNORMAL HIGH (ref 3.87–5.11)
RDW: 16.3 % — ABNORMAL HIGH (ref 11.5–15.5)
WBC: 7 10*3/uL (ref 4.0–10.5)
nRBC: 0 % (ref 0.0–0.2)

## 2020-02-16 LAB — BASIC METABOLIC PANEL
Anion gap: 12 (ref 5–15)
BUN: 12 mg/dL (ref 6–20)
CO2: 22 mmol/L (ref 22–32)
Calcium: 9.3 mg/dL (ref 8.9–10.3)
Chloride: 105 mmol/L (ref 98–111)
Creatinine, Ser: 0.65 mg/dL (ref 0.44–1.00)
GFR calc Af Amer: >60 mL/min (ref >60)
GFR calc non Af Amer: >60 mL/min (ref >60)
Glucose, Bld: 102 mg/dL — ABNORMAL HIGH (ref 70–99)
Potassium: 4.1 mmol/L (ref 3.5–5.1)
Sodium: 139 mmol/L (ref 135–145)

## 2020-02-16 LAB — I-STAT BETA HCG BLOOD, ED (MC, WL, AP ONLY): I-stat hCG, quantitative: <5 m[IU]/mL (ref <5)

## 2020-02-16 LAB — TROPONIN I (HIGH SENSITIVITY)
Troponin I (High Sensitivity): 3 ng/L (ref <18)
Troponin I (High Sensitivity): <2 ng/L (ref <18)

## 2020-02-16 MED ORDER — SODIUM CHLORIDE 0.9% FLUSH: 3.0000 mL | Freq: Once | INTRAVENOUS | Status: DC

## 2020-02-16 NOTE — ED Triage Notes (Signed)
Pt to triage via [facility] for L sided chest pain x 3 days.  Pain worse when lying down.  Also reports SOB.  Denies nausea and vomiting.

## 2020-02-17 DIAGNOSIS — R0789 Other chest pain: Secondary | ICD-10-CM | POA: Diagnosis not present

## 2020-02-17 MED ORDER — OXYCODONE-ACETAMINOPHEN 5-325 MG PO TABS
1.0000 | ORAL_TABLET | Freq: Once | ORAL | Status: AC
  Administered 2020-02-17: 1 via ORAL
  Filled 2020-02-17: qty 1

## 2020-02-17 MED ORDER — KETOROLAC TROMETHAMINE 60 MG/2ML IM SOLN
30.0000 mg | Freq: Once | INTRAMUSCULAR | Status: AC
  Administered 2020-02-17: 30 mg via INTRAMUSCULAR
  Filled 2020-02-17: qty 2

## 2020-02-17 MED ORDER — IBUPROFEN 400 MG PO TABS
400.0000 mg | ORAL_TABLET | Freq: Three times a day (TID) | ORAL | 0 refills | Status: AC
Start: 2020-02-17 — End: 2020-02-24

## 2020-02-17 NOTE — ED Provider Notes (Signed)
Emergency Department Provider Note   I have reviewed the triage vital signs and the nursing notes.   HISTORY  Chief Complaint Chest Pain   HPI Maureen Lewis is a 24 y.o. female with multiple medical problems as documented below who presents to the emergency department today secondary to chest pain.  Patient gave birth approximately 5 months ago which was uncomplicated.  She is had some intermittent leg swelling since that time but over the last 3 to 5 days she has developed some chest pain.  She describes a as sharp, deep, stabbing, pressure, pleuritic.  She states that it is worse when she takes a big deep breath or uses her left arm.  She also states that laying down seems to make it worse.  No noticed breast masses and she is not breast-feeding at this time.   No other associated or modifying symptoms.    Past Medical History:  Diagnosis Date  . Abnormal uterine bleeding   . Acid reflux   . Alpha thalassemia trait   . Amenorrhea   . Anemia    no current med.  . Cesarean delivery delivered 10/11/2019   10/09/2019 - primary CS for failed IOL  . Complication of anesthesia    states had to keep giving her anesthesia during EGD  . Constipation   . Depression    "I'm good"  . Finger mass, left 03/2017   middle finger  . Gestational diabetes   . Gestational diabetes 05/23/2019   Current Diabetic Medications:  None  [ ]  Aspirin 81 mg daily after 12 weeks (? A2/B GDM)  Required Referrals for A1GDM or A2GDM: [ ]  Diabetes Education and Testing Supplies [ ]  Nutrition Cousult  For A2/B GDM or higher classes of DM [ ]  Diabetes Education and Testing Supplies [ ]  Nutrition Counsult [ ]  Fetal ECHO after 20 weeks  [ ]  Eye exam for retina evaluation   Baseline and surveillance labs (  . Gestational hypertension 09/23/2019   Guidelines for Antenatal Testing and Sonography  (with updated ICD-10 codes)  Updated  26-Sep-2019 with Dr. Tama High  INDICATION U/S 2 X week NST/AFI  or full BPP  wkly DELIVERY Diabetes   A1 - good control - O24.410    A2 - good control - O24.419      A2  - poor control or poor compliance - O24.419, E11.65   (Macrosomia or polyhydramnios) **E11.65 is extra code for poor control**    A2/B - O24.  Marland Kitchen Headache   . Infection    UTI  . Irritable bowel syndrome (IBS)   . Morbid obesity with body mass index (BMI) of 45.0 to 49.9 in adult [facility])   . Obesity during pregnancy, antepartum 04/06/2019   Body mass index is 53.71 kg/m.  Recommendations [x]  Aspirin 81 mg daily after 12 weeks; discontinue after 36 weeks [ ]  Nutrition consult [ ]  Weight gain 11-20 lbs for singleton and 25-35 lbs for twin pregnancy (IOM guidelines) Higher class of obesity patients recommended to gain closer to lower limit  Weight loss is associated with adverse outcomes [ ]  Baseline and surveillance labs (pulled in fr  . Plantar fasciitis, bilateral   . Polycystic ovary syndrome   . Prediabetes    "prediabetes"  . Pregnancy induced hypertension   . Shortness of breath   . Sleep apnea    no CPAP use  . Supervision of normal first pregnancy 04/06/2019   [name]: [ x]Initial [ x]12 [ ] 20 [ ]   28 [ ] 32 [ ] 36 [ ] 38 [ ] 39 [ ] 40 Nursing Staff Provider Office Location [facility]  Dating   LMP Language  English  Anatomy US  Nml Flu Vaccine  Declined 07/28/19 Genetic Screen  NIPS: low risks   AFP:   negative  TDaP vaccine   08/25/19 Hgb A1C or  GTT Early A1C 5.8 Third trimester: GDM insulin Rhogam  NA   LAB RESULTS  Feeding Plan Breast Blood Type    Patient Active Problem List   Diagnosis Date Noted  . Postpartum care following cesarean delivery 11/22/2019  . Contraceptive management 11/22/2019  . Positive RPR test 10/09/2019  . Migraine headache 07/12/2019  . Alpha thalassemia trait   . Vitamin D deficiency 09/16/2017  . Prediabetes 09/16/2017  . Depression 09/16/2017  . Class 3 severe obesity with serious comorbidity and body mass index (BMI) of 50.0 to 59.9 in adult ([place]) 09/16/2017   . PCOS (polycystic ovarian syndrome) 08/13/2017  . Mass 05/06/2017  . Vertigo 10/06/2016  . Anemia 09/26/2016  . Pseudotumor cerebri syndrome 08/28/2016  . Obesity hypoventilation syndrome ([place]) 08/28/2016  . Benign paroxysmal positional vertigo due to bilateral vestibular disorder 08/28/2016  . OSA (obstructive sleep apnea) 08/28/2016  . Hypersomnia with sleep apnea 08/28/2016  . Female hirsutism 08/28/2016  . GERD (gastroesophageal reflux disease) 02/24/2015  . Constipation 10/02/2014    Past Surgical History:  Procedure Laterality Date  . CESAREAN SECTION N/A 10/09/2019   Procedure: CESAREAN SECTION;  Surgeon: Aletha Halim, MD;  Location: [facility];  Service: Obstetrics;  Laterality: N/A;  . COLONOSCOPY WITH PROPOFOL  10/07/2016  . ESOPHAGOGASTRODUODENOSCOPY  12/21/2015  . EXCISION MASS UPPER EXTREMETIES Left 04/21/2017   Procedure: EXCISION MASS LEFT MIDDLE FINGER;  Surgeon: Daryll Brod, MD;  Location: [place];  Service: Orthopedics;  Laterality: Left;    Current Outpatient Rx  . Order #: [id number] Class: Normal  . Order #: [id number] Class: Print  . Order #: [id number] Class: Normal  . Order #: [id number] Class: Normal  . Order #: [id number] Class: Print  . Order #: [id number] Class: Historical Med  . Order #: [id number] Class: Normal  . Order #: [id number] Class: Historical Med  . Order #: [id number] Class: Historical Med  . Order #: [id number] Class: Historical Med  . Order #: [id number] Class: Normal    Allergies Bee pollen, Bee venom, Hydrocodone-acetaminophen, Peach flavor, and Pollen extract  Family History  Problem Relation Age of Onset  . Diabetes Maternal Aunt   . Hypertension Maternal Uncle   . Depression Maternal Grandfather   . Diabetes Maternal Grandfather   . Hypertension Maternal Grandfather   . Arthritis Mother   . High blood pressure Mother   . Depression Mother   . Sleep apnea Mother   . Obesity Mother     Social History Social History    Tobacco Use  . Smoking status: Never Smoker  . Smokeless tobacco: Never Used  Substance Use Topics  . Alcohol use: Not Currently    Comment: social   . Drug use: No    Review of Systems  All other systems negative except as documented in the HPI. All pertinent positives and negatives as reviewed in the HPI. ____________________________________________   PHYSICAL EXAM:  VITAL SIGNS: ED Triage Vitals  Enc Vitals Group     BP 02/16/20 1805 (!) 147/91     Pulse Rate 02/16/20 1805 83     Resp 02/16/20 1805 16     Temp 02/16/20 1805 98.3 F (36.8 C)     Temp Source  02/16/20 1805 Oral     SpO2 02/16/20 1805 99 %    Constitutional: Alert and oriented. Well appearing and in no acute distress. Eyes: Conjunctivae are normal. PERRL. EOMI. Head: Atraumatic. Nose: No congestion/rhinnorhea. Mouth/Throat: Mucous membranes are moist.  Oropharynx non-erythematous. Neck: No stridor.  No meningeal signs.   Cardiovascular: Normal rate, regular rhythm. Good peripheral circulation. Grossly normal heart sounds.   Respiratory: Normal respiratory effort.  No retractions. Lungs CTAB. Gastrointestinal: Soft and nontender. No distention.  Musculoskeletal: No lower extremity tenderness nor edema. No gross deformities of extremities. Has TTP to left costochondral margin that reproduces pain, also pain there with ROM of left arm that reproduces pain.  Neurologic:  Normal speech and language. No gross focal neurologic deficits are appreciated.  Skin:  Skin is warm, dry and intact. No rash noted.   ____________________________________________   LABS (all labs ordered are listed, but only abnormal results are displayed)  Labs Reviewed  BASIC METABOLIC PANEL - Abnormal; Notable for the following components:      Result Value   Glucose, Bld 102 (*)    All other components within normal limits  CBC - Abnormal; Notable for the following components:   RBC 5.57 (*)    Hemoglobin 11.7 (*)    MCV  70.7 (*)    MCH 21.0 (*)    MCHC 29.7 (*)    RDW 16.3 (*)    All other components within normal limits  I-STAT BETA HCG BLOOD, ED ([facility], [facility], [facility] ONLY)  TROPONIN I (HIGH SENSITIVITY)  TROPONIN I (HIGH SENSITIVITY)   ____________________________________________  EKG   EKG Interpretation  Date/Time:  Thursday February 16 2020 17:57:53 EDT Ventricular Rate:  83 PR Interval:  150 QRS Duration: 92 QT Interval:  352 QTC Calculation: 413 R Axis:   35 Text Interpretation: Normal sinus rhythm Normal ECG When compared with ECG of 11/25/2015, No significant change was found Confirmed by Delora Fuel ([id number]) on 02/16/2020 9:28:35 PM       ____________________________________________  RADIOLOGY  DG Chest 2 View  Result Date: 02/16/2020 CLINICAL DATA:  Chest pain EXAM: CHEST - 2 VIEW COMPARISON:  11/25/2015 FINDINGS: The heart size and mediastinal contours are within normal limits. Both lungs are clear. The visualized skeletal structures are unremarkable. IMPRESSION: No active cardiopulmonary disease. Electronically Signed   By: Donavan Foil M.D.   On: 02/16/2020 19:02    ____________________________________________   PROCEDURES  Procedure(s) performed:   Procedures   ____________________________________________   INITIAL IMPRESSION / ASSESSMENT AND PLAN / ED COURSE  Patient is outside the window for eclampsia she is almost 5 months postpartum.  She also, based on normal work-up here, has low likelihood of postpartum cardiomyopathy.  This is her first baby she likely has some muscle strain of some sort versus costochondritis.  Low suspicion for pulmonary embolus and she is PERC negative.  Blood pressures are intermittently elevated here we will have her follow-up with her primary doctor once pain resolves to ensure that these are improved.  At this time is no indication for hospitalization or further work-up in the emergency room or further imaging.  We will follow-up with her primary  doctor in a few days if not improving. Return if worsening.    Pertinent labs & imaging results that were available during my care of the patient were reviewed by me and considered in my medical decision making (see chart for details).  A medical screening exam was performed and I feel the patient has had  an appropriate workup for their chief complaint at this time and likelihood of emergent condition existing is low. They have been counseled on decision, discharge, follow up and which symptoms necessitate immediate return to the emergency department. They or their family verbally stated understanding and agreement with plan and discharged in stable condition.   ____________________________________________  FINAL CLINICAL IMPRESSION(S) / ED DIAGNOSES  Final diagnoses:  Hypertension, unspecified type  Chest pain, unspecified type    MEDICATIONS GIVEN DURING THIS VISIT:  Medications  sodium chloride flush (NS) 0.9 % injection 3 mL (3 mLs Intravenous Not Given 02/17/20 0032)  ketorolac (TORADOL) injection 30 mg (has no administration in time range)  oxyCODONE-acetaminophen (PERCOCET/ROXICET) 5-325 MG per tablet 1 tablet (has no administration in time range)     NEW OUTPATIENT MEDICATIONS STARTED DURING THIS VISIT:  New Prescriptions   IBUPROFEN (ADVIL) 400 MG TABLET    Take 1 tablet (400 mg total) by mouth 3 (three) times daily for 7 days.    Note:  This note was prepared with assistance of Dragon voice recognition software. Occasional wrong-word or sound-a-like substitutions may have occurred due to the inherent limitations of voice recognition software.   Eisa Necaise, Corene Cornea, MD 02/17/20 [phone]

## 2020-02-21 ENCOUNTER — Other Ambulatory Visit: Payer: Self-pay

## 2020-02-21 ENCOUNTER — Encounter: Payer: Self-pay | Admitting: Adult Health

## 2020-02-21 ENCOUNTER — Ambulatory Visit (INDEPENDENT_AMBULATORY_CARE_PROVIDER_SITE_OTHER): Payer: BC Managed Care – PPO | Admitting: Adult Health

## 2020-02-21 ENCOUNTER — Ambulatory Visit (INDEPENDENT_AMBULATORY_CARE_PROVIDER_SITE_OTHER): Payer: BC Managed Care – PPO

## 2020-02-21 VITALS — BP 126/84 | Temp 97.0°F | Wt >= 6400 oz

## 2020-02-21 DIAGNOSIS — R079 Chest pain, unspecified: Secondary | ICD-10-CM

## 2020-02-21 DIAGNOSIS — M5137 Other intervertebral disc degeneration, lumbosacral region: Secondary | ICD-10-CM | POA: Diagnosis not present

## 2020-02-21 DIAGNOSIS — M5416 Radiculopathy, lumbar region: Secondary | ICD-10-CM

## 2020-02-21 DIAGNOSIS — M791 Myalgia, unspecified site: Secondary | ICD-10-CM

## 2020-02-21 DIAGNOSIS — M5412 Radiculopathy, cervical region: Secondary | ICD-10-CM | POA: Diagnosis not present

## 2020-02-21 DIAGNOSIS — M542 Cervicalgia: Secondary | ICD-10-CM | POA: Diagnosis not present

## 2020-02-21 LAB — COMPREHENSIVE METABOLIC PANEL
ALT: 12 U/L (ref 0–35)
AST: 13 U/L (ref 0–37)
Albumin: 4.1 g/dL (ref 3.5–5.2)
Alkaline Phosphatase: 95 U/L (ref 39–117)
BUN: 11 mg/dL (ref 6–23)
CO2: 27 mEq/L (ref 19–32)
Calcium: 9.6 mg/dL (ref 8.4–10.5)
Chloride: 105 mEq/L (ref 96–112)
Creatinine, Ser: 0.59 mg/dL (ref 0.40–1.20)
GFR: 154.8 mL/min (ref >60.00)
Glucose, Bld: 92 mg/dL (ref 70–99)
Potassium: 4.3 mEq/L (ref 3.5–5.1)
Sodium: 137 mEq/L (ref 135–145)
Total Bilirubin: 0.4 mg/dL (ref 0.2–1.2)
Total Protein: 7.4 g/dL (ref 6.0–8.3)

## 2020-02-21 LAB — URINALYSIS
Bilirubin Urine: NEGATIVE
Hgb urine dipstick: NEGATIVE
Ketones, ur: NEGATIVE
Leukocytes,Ua: NEGATIVE
Nitrite: NEGATIVE
Specific Gravity, Urine: 1.025 (ref 1.000–1.030)
Total Protein, Urine: NEGATIVE
Urine Glucose: NEGATIVE
Urobilinogen, UA: 1 (ref 0.0–1.0)
pH: 6.5 (ref 5.0–8.0)

## 2020-02-21 LAB — CBC WITH DIFFERENTIAL/PLATELET
Basophils Absolute: 0 10*3/uL (ref 0.0–0.1)
Basophils Relative: 0.5 % (ref 0.0–3.0)
Eosinophils Absolute: 0 10*3/uL (ref 0.0–0.7)
Eosinophils Relative: 0.6 % (ref 0.0–5.0)
HCT: 37.3 % (ref 36.0–46.0)
Hemoglobin: 11.7 g/dL — ABNORMAL LOW (ref 12.0–15.0)
Lymphocytes Relative: 34.3 % (ref 12.0–46.0)
Lymphs Abs: 2.2 10*3/uL (ref 0.7–4.0)
MCHC: 31.5 g/dL (ref 30.0–36.0)
MCV: 67.6 fl — ABNORMAL LOW (ref 78.0–100.0)
Monocytes Absolute: 0.4 10*3/uL (ref 0.1–1.0)
Monocytes Relative: 6.3 % (ref 3.0–12.0)
Neutro Abs: 3.8 10*3/uL (ref 1.4–7.7)
Neutrophils Relative %: 58.3 % (ref 43.0–77.0)
Platelets: 287 10*3/uL (ref 150.0–400.0)
RBC: 5.51 Mil/uL — ABNORMAL HIGH (ref 3.87–5.11)
RDW: 17.2 % — ABNORMAL HIGH (ref 11.5–15.5)
WBC: 6.5 10*3/uL (ref 4.0–10.5)

## 2020-02-21 LAB — C-REACTIVE PROTEIN: CRP: 2.3 mg/dL (ref 0.5–20.0)

## 2020-02-21 LAB — SEDIMENTATION RATE: Sed Rate: 126 mm/hr — ABNORMAL HIGH (ref 0–20)

## 2020-02-21 MED ORDER — METHYLPREDNISOLONE 4 MG PO TBPK: ORAL_TABLET | ORAL | 0 refills | Status: DC

## 2020-02-21 NOTE — Patient Instructions (Signed)
It was great seeing you today   I am going to do some blood work and Production manager. You can go to the [place] office for the xrays at any time   I will follow up with you once I get everything back

## 2020-02-21 NOTE — Progress Notes (Signed)
Subjective:    Patient ID: Maureen Lewis, female    DOB: 1996-09-25, 23 y.o.   MRN: [id number]  HPI 24 year old female who  has a past medical history of Abnormal uterine bleeding, Acid reflux, Alpha thalassemia trait, Amenorrhea, Anemia, Cesarean delivery delivered (15/72/6203), Complication of anesthesia, Constipation, Depression, Finger mass, left (03/2017), Gestational diabetes, Gestational diabetes (05/23/2019), Gestational hypertension (09/23/2019), Headache, Infection, Irritable bowel syndrome (IBS), Morbid obesity with body mass index (BMI) of 45.0 to 49.9 in adult [facility]), Obesity during pregnancy, antepartum (04/06/2019), Plantar fasciitis, bilateral, Polycystic ovary syndrome, Prediabetes, Pregnancy induced hypertension, Shortness of breath, Sleep apnea, and Supervision of normal first pregnancy (04/06/2019).  She was seen in the ER on 02/16/2020 for chest pain. She developed chest pain 3-5 days prior to 02/16/2020 Pain was described as sharp, deep, stabbing, pressure, and pleuritic. Pain was worse when she took a deep breath or uses her left arm. Also, laying down seemed to make it worse.   Her work-up in the ER including EKG, chest x-ray, CBC, BMP, and troponin were all negative  She was outside the window for eclampsia she was almost 5 months postpartum.  They thought that this could possibly be some muscle strain or costochondritis.  There was low suspicion for pulmonary embolism.  She presents today for follow-up and reports that she continues to experience her symptoms of chest pain.  Her chest pain continues to feel sharp, stabbing, pressure, and pleuritic.  Laying down seems to make the pain worse.  He denies shortness of breath, fevers, or chills.  She does report darker than normal urine and is staying hydrated. She not experienced any trauma, aggravating injury, or been performing exercises  Externally, she reports episodes of numbness and tingling in both of her extremities.  This has  been intermittent over the last month or so.  She often feels as though she has trouble holding things when she is experiencing the numbness and tingling in her hands.  She denies dizziness, lightheadedness, or blurred vision  Review of Systems See HPI   Past Medical History:  Diagnosis Date  . Abnormal uterine bleeding   . Acid reflux   . Alpha thalassemia trait   . Amenorrhea   . Anemia    no current med.  . Cesarean delivery delivered 10/11/2019   10/09/2019 - primary CS for failed IOL  . Complication of anesthesia    states had to keep giving her anesthesia during EGD  . Constipation   . Depression    "I'm good"  . Finger mass, left 03/2017   middle finger  . Gestational diabetes   . Gestational diabetes 05/23/2019   Current Diabetic Medications:  None  _0  Aspirin 81 mg daily after 12 weeks (? A2/B GDM)  Required Referrals for A1GDM or A2GDM: _1  Diabetes Education and Testing Supplies _2  Nutrition Cousult  For A2/B GDM or higher classes of DM _3  Diabetes Education and Testing Supplies _4  Nutrition Counsult _5  Fetal ECHO after 20 weeks  _6  Eye exam for retina evaluation   Baseline and surveillance labs (  . Gestational hypertension 09/23/2019   Guidelines for Antenatal Testing and Sonography  (with updated ICD-10 codes)  Updated  2019-10-03 with Dr. Tama High  INDICATION U/S 2 X week NST/AFI  or full BPP wkly DELIVERY Diabetes   A1 - good control - O24.410    A2 - good control - O24.419      A2  - poor  control or poor compliance - O24.419, E11.65   (Macrosomia or polyhydramnios) **E11.65 is extra code for poor control**    A2/B - O24.  Marland Kitchen Headache   . Infection    UTI  . Irritable bowel syndrome (IBS)   . Morbid obesity with body mass index (BMI) of 45.0 to 49.9 in adult [facility])   . Obesity during pregnancy, antepartum 04/06/2019   Body mass index is 53.71 kg/m.  Recommendations _0  Aspirin 81 mg daily after 12 weeks; discontinue after 36 weeks _1  Nutrition consult _2  Weight  gain 11-20 lbs for singleton and 25-35 lbs for twin pregnancy (IOM guidelines) Higher class of obesity patients recommended to gain closer to lower limit  Weight loss is associated with adverse outcomes _3  Baseline and surveillance labs (pulled in fr  . Plantar fasciitis, bilateral   . Polycystic ovary syndrome   . Prediabetes    "prediabetes"  . Pregnancy induced hypertension   . Shortness of breath   . Sleep apnea    no CPAP use  . Supervision of normal first pregnancy 04/06/2019   [name]: [ x]Initial [ x]12 _4 20 _5 28 _6 32 _7 36 _8 38 _9 39 _10 40 Nursing Staff Provider Office Location [facility]  Dating   LMP Language  English  Anatomy US  Nml Flu Vaccine  Declined 07/28/19 Genetic Screen  NIPS: low risks   AFP:   negative  TDaP vaccine   08/25/19 Hgb A1C or  GTT Early A1C 5.8 Third trimester: GDM insulin Rhogam  NA   LAB RESULTS  Feeding Plan Breast Blood Type    Social History   Socioeconomic History  . Marital status: Single    Spouse name: Not on file  . Number of children: Not on file  . Years of education: Not on file  . Highest education level: Not on file  Occupational History  . Occupation: Chemical engineer: [employer]  Tobacco Use  . Smoking status: Never Smoker  . Smokeless tobacco: Never Used  Substance and Sexual Activity  . Alcohol use: Not Currently    Comment: social   . Drug use: No  . Sexual activity: Yes    Birth control/protection: None  Other Topics Concern  . Not on file  Social History Narrative   Lives with mom and cousin, a grandmother in [place] also helps care for her.       She is in nursing school.    Social Determinants of Health   Financial Resource Strain:   . Difficulty of Paying Living Expenses:   Food Insecurity:   . Worried About Charity fundraiser in the Last Year:   . Arboriculturist in the Last Year:   Transportation Needs:   . Film/video editor (Medical):   Marland Kitchen Lack of Transportation (Non-Medical):    Physical Activity:   . Days of Exercise per Week:   . Minutes of Exercise per Session:   Stress:   . Feeling of Stress :   Social Connections:   . Frequency of Communication with Friends and Family:   . Frequency of Social Gatherings with Friends and Family:   . Attends Religious Services:   . Active Member of Clubs or Organizations:   . Attends Archivist Meetings:   Marland Kitchen Marital Status:   Intimate Partner Violence:   . Fear of Current or Ex-Partner:   . Emotionally Abused:   Marland Kitchen Physically Abused:   .  Sexually Abused:     Past Surgical History:  Procedure Laterality Date  . CESAREAN SECTION N/A 10/09/2019   Procedure: CESAREAN SECTION;  Surgeon: Aletha Halim, MD;  Location: [facility];  Service: Obstetrics;  Laterality: N/A;  . COLONOSCOPY WITH PROPOFOL  10/07/2016  . ESOPHAGOGASTRODUODENOSCOPY  12/21/2015  . EXCISION MASS UPPER EXTREMETIES Left 04/21/2017   Procedure: EXCISION MASS LEFT MIDDLE FINGER;  Surgeon: Daryll Brod, MD;  Location: [place];  Service: Orthopedics;  Laterality: Left;    Family History  Problem Relation Age of Onset  . Diabetes Maternal Aunt   . Hypertension Maternal Uncle   . Depression Maternal Grandfather   . Diabetes Maternal Grandfather   . Hypertension Maternal Grandfather   . Arthritis Mother   . High blood pressure Mother   . Depression Mother   . Sleep apnea Mother   . Obesity Mother     Allergies  Allergen Reactions  . Bee Pollen Hives, Shortness Of Breath and Swelling  . Bee Venom Hives, Shortness Of Breath and Swelling  . Hydrocodone-Acetaminophen Anaphylaxis    No problem when she takes Tylenol  . Peach Flavor Hives, Shortness Of Breath and Other (See Comments)    SWELLING OF MOUTH  . Pollen Extract Hives, Shortness Of Breath and Swelling    Current Outpatient Medications on File Prior to Visit  Medication Sig Dispense Refill  . amLODipine (NORVASC) 10 MG tablet Take 1 tablet (10 mg total) by mouth  daily. 30 tablet 2  . Blood Pressure Monitoring KIT 1 kit by Does not apply route once a week. 1 kit 0  . ferrous sulfate 325 (65 FE) MG tablet Take 1 tablet (325 mg total) by mouth every other day. 30 tablet 2  . hydrochlorothiazide (HYDRODIURIL) 25 MG tablet Take 1 tablet (25 mg total) by mouth daily. 30 tablet 2  . ibuprofen (ADVIL) 400 MG tablet Take 1 tablet (400 mg total) by mouth 3 (three) times daily for 7 days. 21 tablet 0  . loratadine (CLARITIN) 10 MG tablet Take 10 mg by mouth daily as needed for allergies.     . medroxyPROGESTERone (DEPO-PROVERA) 150 MG/ML injection Inject 1 mL (150 mg total) into the muscle every 3 (three) months. 1 mL 3  . ondansetron (ZOFRAN-ODT) 4 MG disintegrating tablet Take 4 mg by mouth every 8 (eight) hours as needed.    . polyethylene glycol powder (GLYCOLAX/MIRALAX) 17 GM/SCOOP powder     . Prenatal Vit-Fe Fumarate-FA (PRENATAL VITAMINS PO) Take by mouth.    . topiramate (TOPAMAX) 25 MG tablet Take 1 tablet (25 mg total) by mouth 2 (two) times daily. 90 tablet 1   No current facility-administered medications on file prior to visit.    BP 126/84   Temp (!) 97 F (36.1 C)   Wt (!) 433 lb (196.4 kg)   BMI 55.59 kg/m       Objective:   Physical Exam Vitals and nursing note reviewed.  Constitutional:      Appearance: Normal appearance.  Cardiovascular:     Rate and Rhythm: Normal rate and regular rhythm.     Pulses: Normal pulses.     Heart sounds: Normal heart sounds.  Pulmonary:     Effort: Pulmonary effort is normal.     Breath sounds: Normal breath sounds.  Musculoskeletal:        General: Tenderness present. Normal range of motion.     Comments: She has reproducible pain with palpation throughout chest wall, trapezius, and  her upper back.  There is no spinal tenderness throughout.  Skin:    General: Skin is warm and dry.  Neurological:     General: No focal deficit present.     Mental Status: She is alert and oriented to person,  place, and time.  Psychiatric:        Mood and Affect: Mood normal.        Behavior: Behavior normal.        Thought Content: Thought content normal.        Judgment: Judgment normal.       Assessment & Plan:  1. Chest pain, unspecified type -Seems to be muscular in origin.  Doubt fibromyalgia or rhabdomyolysis but will check labs including CK, sed rate, and ANA.  We will place her on Medrol Dosepak to see if this helps with inflammation. - CK Total and CKMB - Sedimentation Rate - C-reactive Protein - CBC with Differential/Platelet - CMP - Urinalysis - ANA  2. Muscle pain  - CK Total and CKMB - Sedimentation Rate - C-reactive Protein - CBC with Differential/Platelet - CMP - Urinalysis - ANA - methylPREDNISolone (MEDROL DOSEPAK) 4 MG TBPK tablet; Take as directed  Dispense: 21 tablet; Refill: 0  3. Cervical radiculopathy -I am thinking that her radiculopathy symptoms are coming from likely osteoarthritis in her spine due to obesity.  Check x-ray of cervical lumbar spine.  Did encourage weight loss - DG Cervical Spine Complete; Future  4. Lumbar radiculopathy  - DG Lumbar Spine Complete; Future   Dorothyann Peng, NP

## 2020-02-22 ENCOUNTER — Telehealth: Payer: Self-pay | Admitting: Adult Health

## 2020-02-22 NOTE — Telephone Encounter (Signed)
Maureen Lewis has not prescribed.  Gave to pt when she gave birth.

## 2020-02-22 NOTE — Telephone Encounter (Signed)
[facility]: [place] ([place] [place] - [address]

## 2020-02-23 ENCOUNTER — Telehealth: Payer: Self-pay | Admitting: Neurology

## 2020-02-23 ENCOUNTER — Other Ambulatory Visit: Payer: Self-pay | Admitting: Neurology

## 2020-02-23 ENCOUNTER — Other Ambulatory Visit: Payer: Self-pay

## 2020-02-23 ENCOUNTER — Encounter: Payer: Self-pay | Admitting: Neurology

## 2020-02-23 ENCOUNTER — Ambulatory Visit (INDEPENDENT_AMBULATORY_CARE_PROVIDER_SITE_OTHER): Payer: BC Managed Care – PPO | Admitting: Neurology

## 2020-02-23 ENCOUNTER — Telehealth: Payer: Self-pay | Admitting: Adult Health

## 2020-02-23 VITALS — BP 153/86 | HR 89 | Temp 96.9°F | Ht 74.5 in | Wt >= 6400 oz

## 2020-02-23 DIAGNOSIS — Z6841 Body Mass Index (BMI) 40.0 and over, adult: Secondary | ICD-10-CM | POA: Diagnosis not present

## 2020-02-23 DIAGNOSIS — D563 Thalassemia minor: Secondary | ICD-10-CM

## 2020-02-23 DIAGNOSIS — Z8669 Personal history of other diseases of the nervous system and sense organs: Secondary | ICD-10-CM

## 2020-02-23 DIAGNOSIS — G4730 Sleep apnea, unspecified: Secondary | ICD-10-CM | POA: Diagnosis not present

## 2020-02-23 DIAGNOSIS — G932 Benign intracranial hypertension: Secondary | ICD-10-CM | POA: Diagnosis not present

## 2020-02-23 DIAGNOSIS — O24410 Gestational diabetes mellitus in pregnancy, diet controlled: Secondary | ICD-10-CM | POA: Diagnosis not present

## 2020-02-23 DIAGNOSIS — G4710 Hypersomnia, unspecified: Secondary | ICD-10-CM | POA: Diagnosis not present

## 2020-02-23 DIAGNOSIS — E662 Morbid (severe) obesity with alveolar hypoventilation: Secondary | ICD-10-CM

## 2020-02-23 DIAGNOSIS — A530 Latent syphilis, unspecified as early or late: Secondary | ICD-10-CM | POA: Diagnosis not present

## 2020-02-23 MED ORDER — FERROUS SULFATE 325 (65 FE) MG PO TABS: 325.0000 mg | ORAL_TABLET | ORAL | 1 refills | Status: DC

## 2020-02-23 MED ORDER — TOPIRAMATE 25 MG PO TABS: 50.0000 mg | ORAL_TABLET | Freq: Two times a day (BID) | ORAL | 1 refills | Status: DC

## 2020-02-23 NOTE — Telephone Encounter (Signed)
Updated patient on her labs and x-rays.  Labs have come back normal except for CRP which was elevated at 126.  Still waiting on ANA.  She does report that she continues to have some pain but this is improved slightly since starting prednisone.  Would like me to refer her back to the healthy weight loss program and I would be glad to do so

## 2020-02-23 NOTE — Addendum Note (Signed)
Addended by: Larey Seat on: 02/23/2020 10:51 AM   Modules accepted: Orders

## 2020-02-23 NOTE — Patient Instructions (Signed)
Obesity Hypoventilation Syndrome  Obesity hypoventilation syndrome (OHS) means that you are not breathing well enough to get air in and out of your lungs efficiently (ventilation). This causes a low oxygen level and a high carbon dioxide level in your blood (hypoventilation). Having too much total body fat (obesity) is a significant risk factor for developing OHS. OHS makes it harder for your heart to pump oxygen-rich blood to your body. It can cause sleep disturbances and make you feel sleepy during the day. Over time, OHS can increase your risk for:  Heart disease.  High blood pressure (hypertension).  Reduced ability to absorb sugar from the bloodstream (insulin resistance).  Heart failure. Over time, OHS weakens your heart and can lead to heart failure. What are the causes? The exact cause of OHS is not known. Possible causes include:  Pressure on the lungs from excess body weight.  Obesity-related changes in how much air the lungs can hold (lung capacity) and how much they can expand (lung compliance).  Failure of the brain to regulate oxygen and carbon dioxide levels properly.  Chemicals (hormones) produced by excess fat cells interfering with breathing regulation.  A breathing condition in which breathing pauses or becomes shallow during sleep (sleep apnea). This condition can eventually cause the body to ventilate poorly and to hold onto carbon dioxide during the day. What increases the risk? You may have a greater risk for OHS if you:  Have a BMI of 30 or higher. BMI is an estimate of body fat that is calculated from height and weight. For adults, a BMI of 30 or higher is considered obese.  Are 40?22 years old.  Carry most of your excess weight around your waist.  Experience moderate symptoms of sleep apnea. What are the signs or symptoms? The most common symptoms of OHS are:  Daytime sleepiness.  Lack of energy.  Shortness of breath.  Morning headaches.  Sleep  apnea.  Trouble concentrating.  Irritability, mood swings, or depression.  Swollen veins in the neck.  Swelling of the legs. How is this diagnosed? Your health care provider may suspect OHS if you are obese and have poor breathing during the day and at night. Your health care provider will also do a physical exam. You may have tests to:  Measure your BMI.  Measure your blood oxygen level with a sensor placed on your finger (pulse oximetry).  Measure blood oxygen and carbon dioxide in a blood sample.  Measure the amount of red blood cells in a blood sample. OHS causes the number of red blood cells you have to increase (polycythemia).  Check your breathing ability (pulmonary function testing).  Check your breathing ability, breathing patterns, and oxygen level while you sleep (sleep study). You may also have a chest X-ray to rule out other breathing problems. You may have an electrocardiogram (ECG) and or echocardiogram to check for signs of heart failure. How is this treated? Weight loss is the most important part of treatment for OHS, and it may be the only treatment that you need. Other treatments may include:  Using a device to open your airway while you sleep, such as a continuous positive airway pressure (CPAP) machine that delivers oxygen to your airway through a mask.  Surgery (gastric bypass surgery) to lower your BMI. This may be needed if: ? You are very obese. ? Other treatments have not worked for you. ? Your OHS is very severe and is causing organ damage, such as heart failure. Follow these   instructions at home:  Medicines  Take over-the-counter and prescription medicines only as told by your health care provider.  Ask your health care provider what medicines are safe for you. You may be told to avoid medicines that can impair breathing and make OHS worse, such as sedatives and narcotics. Sleeping habits  If you are prescribed a CPAP machine, make sure you  understand and use the machine as directed.  Try to get 8 hours of sleep every night.  Go to bed at the same time every night, and get up at the same time every day. General instructions  Work with your health care provider to make a diet and exercise plan that helps you reach and maintain a healthy weight.  Eat a healthy diet.  Avoid smoking.  Exercise regularly as told by your health care provider.  During the evening, do not drink caffeine and do not eat heavy meals.  Keep all follow-up visits as told by your health care provider. This is important. Contact a health care provider if:  You experience new or worsening shortness of breath.  You have chest pain.  You have an irregular heartbeat (palpitations).  You have dizziness.  You faint.  You develop a cough.  You have a fever.  You have chest pain when you breathe (pleurisy). This information is not intended to replace advice given to you by your health care provider. Make sure you discuss any questions you have with your health care provider. Document Revised: 01/28/2019 Document Reviewed: 03/17/2016 Elsevier Patient Education  2020 [employer]  

## 2020-02-23 NOTE — Progress Notes (Signed)
Provider:  Larey Seat, M D  Referring Provider: Eloise Levels, NP at [facility],  Primary Care Physician:  Maureen Peng, NP  Chief Complaint  Patient presents with  . New Patient (Initial Visit)    RM 11, alone. Referral from Maureen Peng, NP for migraines.     HPI:  Maureen Lewis is a 24 y.o. female is seen in a RV - this time referred by NP Maureen Lewis.  02-23-2020; She had been seen here as a referral from [place] at [facility] for evaluation of dizziness and headaches 2017 and was followed in 2018 lst . In the meantime she had a baby girl, born 10-09-2019. She has dropped out of college for now.  Maureen Lewis presents now for a worsening of headaches, of the same quality but of higher intensity than she had 3 years ago.  She had undergone a sleep study with [name] in October 2018 evaluating her risk factors of obesity, retro-orbital pressure headaches, dizzy spells, vertigo in correlation to her pseudotumor condition.  She tested positive for mild sleep apnea with an AHI of 8.4, the REM sleep was much more pronounced with an AHI of 39.6.  And supine AHI was 9.9 versus a nonsupine sleep position AHI of 6/h.  There was no clinically significant hypoxemia or hypercapnia found and we recommended a CPAP titration at the time she returned for the CPAP titration in November 2018 a CPAP at 9 cm water pressure was prescribed heated humidity and 2 cm EPR and she used a nasal pillow.  In 2019 she had again passing out spells headaches and high blood pressure but she was noncompliant with CPAP.  We had made in January 12, 2018 appointment but this did not take place.  She does not have her CPAP anymore(?).  She has not been on medication  And NP Maureen Lewis just started her back on Topiramate after a 2 year hiatus she had been non compliant with medical advise.  HTN persisted, vertigo returned. Counterclockwise vertigo which could be elicited with rapid head movements at repeated head movements.    The pregnancy was hard on her- in the pandemic-and birth did increase the headaches in intensity and frequency. She has developed gestational diabetes and weight gain. c section delivery.      2017 CONSULTATION: Pseudotumor cerebri. 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 She has struggled with morbid obesity for the last 4 years, she has very irregular menstrual periods, and she had her first period in 2014. She went through puberty rather late. The weight gain precipitated the first menstrual cycle. She has always been tall but for the last for 5 years rather large. Her mother reports that even as a toddler when looking at growth charts her daughter exceeded height and weight for her age group by file. Recently both have changed some eating habits but thus far this has not led to a significant weight loss yet. Maureen Lewis was also evaluated in a sleep study and diagnosed with obstructive sleep apnea but reports that treatment was not initiated as her primary care physician felt that she has to lose weight to treat the apnea. She is between a rock and a hard place as she is not able to just loose weight. She is also suspected to have polycystic ovarian syndrome and she has seen an endocrinologist shortly before turning 20 whose position was that he does not see minors. She was accompanied by  her mother. She went [facility] the way to [facility] for an endocrinology consultation which led not to any medical therapy at this point. The endocrinologist had mentioned to her that she would like to see an ovarian ultrasound but to her knowledge none has been ordered.   Her reason for appointment today is that she experienced dizzy spells during which she develops a staggering gait feels off balance and a high fall risk. He started only in September the spells occurred suddenly it is not as if she could this anticipate them she doesn't really have an aura or warning. They have been  daily occurrences for while. They last also for rest of the day until she goes to bed- many hours. She experienced vertigo, described in the room of surroundings spinning counterclockwise- Please note, she suffers from extreme ear wax buildup. At tmes the vertigo was less prominent and rather felt like lightheadedness, presyncope. She has fainted and fell downstairs, and she had completely blacked out. She lost awareness of her surroundings found herself waking up at the bottom of the staircase and may have well been out for several minutes.  Her fall has related to constant headaches that have not improved or worsened over the last weeks. For this reason I will also order an MRI. Nystagmus to the right, lightheadedness, at this time untreated sleep apnea according to the patient's verbal report about a sleep study obtained in August 2017. Most daily headaches since her fall, concussion? She reports a pressure behind the eye and in the temple associated with nausea, photophobia. She reports feeling tired and exhausted, the headaches also exhausted her.     Review of Systems: Out of a complete 14 system review, the patient complains of only the following symptoms, and all other reviewed systems are negative.   Social History   Socioeconomic History  . Marital status: Single    Spouse name: Not on file  . Number of children: Not on file  . Years of education: Not on file  . Highest education level: Not on file  Occupational History  . Occupation: Chemical engineer: [id number]  Tobacco Use  . Smoking status: Never Smoker  . Smokeless tobacco: Never Used  Substance and Sexual Activity  . Alcohol use: Not Currently    Comment: social   . Drug use: No  . Sexual activity: Yes    Birth control/protection: None  Other Topics Concern  . Not on file  Social History Narrative   Right handed    Soda sometimes   Lives with mom and cousin, a grandmother in [place] also helps care for  her.       She is in nursing school.    Social Determinants of Health   Financial Resource Strain:   . Difficulty of Paying Living Expenses:   Food Insecurity:   . Worried About Charity fundraiser in the Last Year:   . Arboriculturist in the Last Year:   Transportation Needs:   . Film/video editor (Medical):   Marland Kitchen Lack of Transportation (Non-Medical):   Physical Activity:   . Days of Exercise per Week:   . Minutes of Exercise per Session:   Stress:   . Feeling of Stress :   Social Connections:   . Frequency of Communication with Friends and Family:   . Frequency of Social Gatherings with Friends and Family:   . Attends Religious Services:   . Active Member of Clubs or  Organizations:   . Attends Archivist Meetings:   Marland Kitchen Marital Status:   Intimate Partner Violence:   . Fear of Current or Ex-Partner:   . Emotionally Abused:   Marland Kitchen Physically Abused:   . Sexually Abused:     Family History  Problem Relation Age of Onset  . Diabetes Maternal Aunt   . Hypertension Maternal Uncle   . Depression Maternal Grandfather   . Diabetes Maternal Grandfather   . Hypertension Maternal Grandfather   . Arthritis Mother   . High blood pressure Mother   . Depression Mother   . Sleep apnea Mother   . Obesity Mother     Past Medical History:  Diagnosis Date  . Abnormal uterine bleeding   . Acid reflux   . Alpha thalassemia trait   . Amenorrhea   . Anemia    no current med.  . Cesarean delivery delivered 10/11/2019   10/09/2019 - primary CS for failed IOL  . Complication of anesthesia    states had to keep giving her anesthesia during EGD  . Constipation   . Depression    "I'm good"  . Finger mass, left 03/2017   middle finger  . Gestational diabetes   . Gestational diabetes 05/23/2019   Current Diabetic Medications:  None  '[ ]'$  Aspirin 81 mg daily after 12 weeks (? A2/B GDM)  Required Referrals for A1GDM or A2GDM: '[ ]'$  Diabetes Education and Testing Supplies '[ ]'$   Nutrition Cousult  For A2/B GDM or higher classes of DM '[ ]'$  Diabetes Education and Testing Supplies '[ ]'$  Nutrition Counsult '[ ]'$  Fetal ECHO after 20 weeks  '[ ]'$  Eye exam for retina evaluation   Baseline and surveillance labs (  . Gestational hypertension 09/23/2019   Guidelines for Antenatal Testing and Sonography  (with updated ICD-10 codes)  Updated  Sep 29, 2019 with Dr. Tama High  INDICATION U/S 2 X week NST/AFI  or full BPP wkly DELIVERY Diabetes   A1 - good control - O24.410    A2 - good control - O24.419      A2  - poor control or poor compliance - O24.419, E11.65   (Macrosomia or polyhydramnios) **E11.65 is extra code for poor control**    A2/B - O24.  Marland Kitchen Headache   . Infection    UTI  . Irritable bowel syndrome (IBS)   . Morbid obesity with body mass index (BMI) of 45.0 to 49.9 in adult [facility])   . Obesity during pregnancy, antepartum 04/06/2019   Body mass index is 53.71 kg/m.  Recommendations '[x]'$  Aspirin 81 mg daily after 12 weeks; discontinue after 36 weeks '[ ]'$  Nutrition consult '[ ]'$  Weight gain 11-20 lbs for singleton and 25-35 lbs for twin pregnancy (IOM guidelines) Higher class of obesity patients recommended to gain closer to lower limit  Weight loss is associated with adverse outcomes '[ ]'$  Baseline and surveillance labs (pulled in fr  . Plantar fasciitis, bilateral   . Polycystic ovary syndrome   . Prediabetes    "prediabetes"  . Pregnancy induced hypertension   . Shortness of breath   . Sleep apnea    no CPAP use  . Supervision of normal first pregnancy 04/06/2019   [name]: [ x]Initial [ x]12 '[ ]'$ 20 '[ ]'$ 28 '[ ]'$ 32 '[ ]'$ 36 '[ ]'$ 38 '[ ]'$ 39 '[ ]'$ 40 Nursing Staff Provider Office Location [facility]  Dating   LMP Language  English  Anatomy US  Nml Flu Vaccine  Declined 07/28/19 Genetic Screen  NIPS: low  risks   AFP:   negative  TDaP vaccine   08/25/19 Hgb A1C or  GTT Early A1C 5.8 Third trimester: GDM insulin Rhogam  NA   LAB RESULTS  Feeding Plan Breast Blood Type    Past Surgical History:   Procedure Laterality Date  . CESAREAN SECTION N/A 10/09/2019   Procedure: CESAREAN SECTION;  Surgeon: Aletha Halim, MD;  Location: [facility];  Service: Obstetrics;  Laterality: N/A;  . COLONOSCOPY WITH PROPOFOL  10/07/2016  . ESOPHAGOGASTRODUODENOSCOPY  12/21/2015  . EXCISION MASS UPPER EXTREMETIES Left 04/21/2017   Procedure: EXCISION MASS LEFT MIDDLE FINGER;  Surgeon: Daryll Brod, MD;  Location: [place];  Service: Orthopedics;  Laterality: Left;    Current Outpatient Medications  Medication Sig Dispense Refill  . amLODipine (NORVASC) 10 MG tablet Take 1 tablet (10 mg total) by mouth daily. 30 tablet 2  . Blood Pressure Monitoring KIT 1 kit by Does not apply route once a week. 1 kit 0  . ferrous sulfate 325 (65 FE) MG tablet Take 1 tablet (325 mg total) by mouth every other day. 45 tablet 1  . hydrochlorothiazide (HYDRODIURIL) 25 MG tablet Take 1 tablet (25 mg total) by mouth daily. 30 tablet 2  . ibuprofen (ADVIL) 400 MG tablet Take 1 tablet (400 mg total) by mouth 3 (three) times daily for 7 days. 21 tablet 0  . loratadine (CLARITIN) 10 MG tablet Take 10 mg by mouth daily as needed for allergies.     . medroxyPROGESTERone (DEPO-PROVERA) 150 MG/ML injection Inject 1 mL (150 mg total) into the muscle every 3 (three) months. 1 mL 3  . methylPREDNISolone (MEDROL DOSEPAK) 4 MG TBPK tablet Take as directed 21 tablet 0  . ondansetron (ZOFRAN-ODT) 4 MG disintegrating tablet Take 4 mg by mouth every 8 (eight) hours as needed.    . polyethylene glycol powder (GLYCOLAX/MIRALAX) 17 GM/SCOOP powder     . Prenatal Vit-Fe Fumarate-FA (PRENATAL VITAMINS PO) Take by mouth.    . topiramate (TOPAMAX) 25 MG tablet Take 1 tablet (25 mg total) by mouth 2 (two) times daily. 90 tablet 1   No current facility-administered medications for this visit.    Allergies as of 02/23/2020 - Review Complete 02/21/2020  Allergen Reaction Noted  . Bee pollen Hives, Shortness Of Breath, and Swelling  12/04/2015  . Bee venom Hives, Shortness Of Breath, and Swelling 02/18/2019  . Hydrocodone-acetaminophen Anaphylaxis 04/29/2017  . Peach flavor Hives, Shortness Of Breath, and Other (See Comments) 04/14/2017  . Pollen extract Hives, Shortness Of Breath, and Swelling 12/04/2015    Vitals: BP (!) 153/86 (BP Location: Left Arm, Patient Position:  Sitting, Cuff Size: Normal)   Pulse 89   Temp (!) 96.9 F (36.1 C)   Ht 6' 2.5" (1.892 m)   Wt (!) 432 lb 6.4 oz (196.1 kg)     BMI 54.77 kg/m  Last Weight:  Wt Readings from Last 1 Encounters:  02/23/20 (!) 432 lb 6.4 oz (196.1 kg)   Last Height:   Ht Readings from Last 1 Encounters:  02/23/20 6' 2.5" (1.892 m)    Physical exam:  General: The patient is awake, alert and appears not in acute distress. Head: Normocephalic, atraumatic. Neck is supple. Mallampati plus size- narrow 3 plus.  neck circumference: 21" Cardiovascular:  Regular rate and rhythm , without  murmurs or carotid bruit, and without distended neck veins. Respiratory: Lungs are clear to auscultation. Skin:  Without evidence of edema, or rash.  Trunk: BMI is  super - elevated ( 54.77) and patient is seated with normal posture.   Neurologic exam : The patient is awake and alert, oriented to place and time. There is a normal attention span & concentration ability. Speech is fluent without dysarthria, dysphonia or aphasia. Mood and affect are appropriate.  Cranial nerves: no loss of smell or taste.  Pupils are equal and briskly reactive to light. Funduscopic exam - bilateral pallor, unclear if edema.  Extraocular movements  in vertical and horizontal planes intact and without nystagmus.  Visual fields by finger perimetry are intact. Hearing to finger rub intact.  Facial sensation intact to fine touch. Facial motor strength is symmetric and tongue and uvula move midline.  Tongue protrusion into either cheek is normal. Shoulder shrug is normal.   PS : Rapid head movements  have again caused a nystagmus to the right that was self-limited to 5 beats, and also provoke the vertigo as a sensation of a counterclockwise rotation of the surrounding area.   Motor exam:  Normal tone ,muscle bulk and symmetric  strength in all extremities. Still having good grip strength but left thenar eminence is atrophied, carpal tunnel.   Sensory:  Fine touch, pinprick and vibration were tested in all extremities. Proprioception was normal. fingers and feet go numb, any time of the day. She described a carpal tunnel distribution for the sensation  Coordination: Rapid alternating movements in the fingers/hands were normal. Finger-to-nose maneuver  normal without evidence of ataxia, dysmetria and no  tremor. Gait and station: Patient walks without assistive device and is able unassisted to climb up to the exam table. Strength within normal limits.  Deep tendon reflexes: in the  upper and lower extremities are symmetric and intact. Babinski maneuver response is downgoing.   Assessment:  After physical and neurologic examination, review of laboratory studies, imaging, neurophysiology testing and pre-existing records, assessment is that of :    Plan:  Treatment plan and additional workup :  Pseudotumor. _Order for LP and opening pressure measures, Cell , diff, protein and glucose,    she is back on topiramate, can remain on TPM, but on 50 mg bid , I doubled the dose.  gestational diabetes and high risk for Obesity hypoventilation, not nursing - was given steriods for chest pain. Inflammation is likely Obesity driven, too.  Covid vaccine - not taken, not planning to take it.   I recommend a new  CPAP titration, possibly with oxygen supplementation  Obstructive Sleep apnea  should definitely be treated in order to correct intracranial pressure as well, helps with the pre diabetes and with HTN.    Her mother is on CPAP.  I will order auto PAP.   PCP please  refer again for to medical  weight loss program/ Dr. Leafy Ro. Asencion Partridge Vanesha Athens MD 02/23/2020

## 2020-02-23 NOTE — Telephone Encounter (Signed)
BCBS Auth: [id number] (exp. 02/23/20 to 04/22/20) and medicaid but [facility] will obtain the auth for medicaid. Patient is scheduled at [facility] for 03/09/20.

## 2020-02-24 ENCOUNTER — Other Ambulatory Visit: Payer: Self-pay | Admitting: Neurology

## 2020-02-26 LAB — CK TOTAL AND CKMB (NOT AT ARMC)
CK, MB: 1.1 ng/mL (ref 0–5.0)
Relative Index: 0.9 (ref 0–4.0)
Total CK: 127 U/L (ref 29–143)

## 2020-02-26 LAB — ANTI-NUCLEAR AB-TITER (ANA TITER): ANA Titer 1: 1:40 {titer} — ABNORMAL HIGH

## 2020-02-26 LAB — ANA: Anti Nuclear Antibody (ANA): POSITIVE — AB

## 2020-02-27 ENCOUNTER — Other Ambulatory Visit: Payer: Self-pay | Admitting: Neurology

## 2020-02-27 ENCOUNTER — Encounter: Payer: Self-pay | Admitting: Adult Health

## 2020-02-27 ENCOUNTER — Telehealth: Payer: Self-pay | Admitting: Neurology

## 2020-02-27 NOTE — Telephone Encounter (Signed)
If patient want's to have Lp before I return  She will have to call [phone] . Thanks Hinton Dyer.

## 2020-02-27 NOTE — Telephone Encounter (Signed)
Vinnie Level from [facility] called and LVM needing to speak to out going referral's coordinator. She states that the pt's Lumbar was canceled on the 14th due to the fact that her CT Scan has to be done before the Lumbar. Please call back when available.

## 2020-02-28 ENCOUNTER — Telehealth: Payer: Self-pay | Admitting: Adult Health

## 2020-02-28 ENCOUNTER — Other Ambulatory Visit: Payer: Self-pay | Admitting: Adult Health

## 2020-02-28 DIAGNOSIS — R768 Other specified abnormal immunological findings in serum: Secondary | ICD-10-CM

## 2020-02-28 MED ORDER — CYCLOBENZAPRINE HCL 10 MG PO TABS: 10.0000 mg | ORAL_TABLET | Freq: Every day | ORAL | 0 refills | Status: DC

## 2020-02-28 MED ORDER — BACLOFEN 10 MG PO TABS
10.0000 mg | ORAL_TABLET | Freq: Two times a day (BID) | ORAL | 0 refills | Status: DC
Start: 2020-02-28 — End: 2021-01-09

## 2020-02-28 NOTE — Telephone Encounter (Signed)
Baclofen sent in.

## 2020-02-28 NOTE — Telephone Encounter (Signed)
Spoke to patient and informed her of her labs for which her  ANA was positive. She continues to have chest wall and back pain.   Will prescribe flexeril and send to rheumatology

## 2020-02-28 NOTE — Telephone Encounter (Signed)
Pt notified that new rx has been sent to the pharmacy.

## 2020-02-28 NOTE — Telephone Encounter (Signed)
Pt is scheduled for CT 03/09/20. Her LP can be scheduled after that date.

## 2020-02-28 NOTE — Telephone Encounter (Signed)
Pt states did not realize it until she picked up the flexeril from the pharmacy but she has already taken it in the past and it did not work for her prior. Pt is wondering if PCP can call in something else.    Pharmacy: [facility] [address]: [phone]

## 2020-02-29 ENCOUNTER — Encounter: Payer: Self-pay | Admitting: Adult Health

## 2020-03-02 ENCOUNTER — Ambulatory Visit (HOSPITAL_COMMUNITY): Admission: RE | Admit: 2020-03-02 | Payer: BC Managed Care – PPO | Source: Ambulatory Visit

## 2020-03-06 NOTE — Telephone Encounter (Signed)
I talked to patient and gave her the telephone number she is going to call and schedule her LP

## 2020-03-09 ENCOUNTER — Ambulatory Visit (HOSPITAL_COMMUNITY)
Admission: RE | Admit: 2020-03-09 | Discharge: 2020-03-09 | Disposition: A | Discharge status: Home / Self Care | Payer: BC Managed Care – PPO | Source: Ambulatory Visit | Attending: Neurology | Admitting: Neurology

## 2020-03-09 ENCOUNTER — Other Ambulatory Visit: Payer: Self-pay

## 2020-03-09 ENCOUNTER — Ambulatory Visit
Admission: RE | Admit: 2020-03-09 | Discharge: 2020-03-09 | Disposition: A | Discharge status: Home / Self Care | Payer: BC Managed Care – PPO | Source: Ambulatory Visit | Attending: Neurology | Admitting: Neurology

## 2020-03-09 DIAGNOSIS — Z6841 Body Mass Index (BMI) 40.0 and over, adult: Secondary | ICD-10-CM

## 2020-03-09 DIAGNOSIS — O24410 Gestational diabetes mellitus in pregnancy, diet controlled: Secondary | ICD-10-CM

## 2020-03-09 DIAGNOSIS — G932 Benign intracranial hypertension: Secondary | ICD-10-CM

## 2020-03-09 DIAGNOSIS — Z8669 Personal history of other diseases of the nervous system and sense organs: Secondary | ICD-10-CM | POA: Diagnosis present

## 2020-03-09 DIAGNOSIS — D563 Thalassemia minor: Secondary | ICD-10-CM

## 2020-03-09 DIAGNOSIS — E662 Morbid (severe) obesity with alveolar hypoventilation: Secondary | ICD-10-CM | POA: Diagnosis not present

## 2020-03-09 DIAGNOSIS — R519 Headache, unspecified: Secondary | ICD-10-CM | POA: Diagnosis not present

## 2020-03-09 LAB — PROTEIN, CSF: Total  Protein, CSF: 19 mg/dL (ref 15–45)

## 2020-03-09 LAB — CSF CELL COUNT WITH DIFFERENTIAL
RBC Count, CSF: 1 /mm3 — ABNORMAL HIGH
Tube #: 3
WBC, CSF: 1 /mm3 (ref 0–5)

## 2020-03-09 LAB — GLUCOSE, CSF: Glucose, CSF: 62 mg/dL (ref 40–70)

## 2020-03-09 MED ORDER — LIDOCAINE HCL (PF) 1 % IJ SOLN: 5.0000 mL | Freq: Once | INTRAMUSCULAR | Status: AC

## 2020-03-09 MED ORDER — LIDOCAINE HCL (PF) 1 % IJ SOLN
INTRAMUSCULAR | Status: AC
  Administered 2020-03-09: 5 mL
  Filled 2020-03-09: qty 10

## 2020-03-09 NOTE — Progress Notes (Signed)
This CT was meant to give the interventional neuroradiologist the all -clear for a spinal tap.  Normal CT- patient can undergo tap.

## 2020-03-09 NOTE — Discharge Instructions (Signed)
Lumbar Puncture, Care After This sheet gives you information about how to care for yourself after your procedure. Your health care provider may also give you more specific instructions. If you have problems or questions, contact your health care provider. What can I expect after the procedure? After the procedure, it is common to have:  Mild discomfort or pain at the puncture site.  A mild headache that is relieved with pain medicines. Follow these instructions at home: Activity   Lie down flat or rest for as long as directed by your health care provider.  Return to your normal activities as told by your health care provider. Ask your health care provider what activities are safe for you.  Avoid lifting anything heavier than 10 lb (4.5 kg) for at least 12 hours after the procedure.  Do not drive for 24 hours if you were given a medicine to help you relax (sedative) during your procedure.  Do not drive or use heavy machinery while taking prescription pain medicine. Puncture site care  Remove or change your bandage (dressing) as told by your health care provider.  Check your puncture area every day for signs of infection. Check for: ? More pain. ? Redness or swelling. ? Fluid or blood leaking from the puncture site. ? Warmth. ? Pus or a bad smell. General instructions  Take over-the-counter and prescription medicines only as told by your health care provider.  Drink enough fluids to keep your urine clear or pale yellow. Your health care provider may recommend drinking caffeine to prevent a headache.  Keep all follow-up visits as told by your health care provider. This is important. Contact a health care provider if:  You have fever or chills.  You have nausea or vomiting.  You have a headache that lasts for more than 2 days or does not get better with medicine. Get help right away if:  You develop any of the following in your  legs: ? Weakness. ? Numbness. ? Tingling.  You are unable to control when you urinate or have a bowel movement (incontinence).  You have signs of infection around your puncture site, such as: ? More pain. ? Redness or swelling. ? Fluid or blood leakage. ? Warmth. ? Pus or a bad smell.  You are dizzy or you feel like you might faint.  You have a severe headache, especially when you sit or stand. Summary  A lumbar puncture is a procedure in which a small needle is inserted into the lower back to remove fluid that surrounds the brain and spinal cord.  After this procedure, it is common to have a headache and pain around the needle insertion area.  Lying flat, staying hydrated, and drinking caffeine can help prevent headaches.  Monitor your needle insertion site for signs of infection, including warmth, fluid, or more pain.  Get help right away if you develop leg weakness, leg numbness, incontinence, or severe headaches. This information is not intended to replace advice given to you by your health care provider. Make sure you discuss any questions you have with your health care provider. Document Revised: 11/19/2016 Document Reviewed: 11/19/2016 Elsevier Patient Education  2020 Elsevier Inc.  

## 2020-03-12 ENCOUNTER — Telehealth: Payer: Self-pay | Admitting: Student

## 2020-03-12 ENCOUNTER — Encounter: Payer: Self-pay | Admitting: Neurology

## 2020-03-12 ENCOUNTER — Other Ambulatory Visit (HOSPITAL_COMMUNITY): Payer: Self-pay | Admitting: Interventional Radiology

## 2020-03-12 ENCOUNTER — Other Ambulatory Visit: Payer: Self-pay | Admitting: Neurology

## 2020-03-12 ENCOUNTER — Telehealth: Payer: Self-pay | Admitting: Neurology

## 2020-03-12 ENCOUNTER — Encounter (HOSPITAL_COMMUNITY): Payer: Self-pay | Admitting: *Deleted

## 2020-03-12 DIAGNOSIS — R51 Headache with orthostatic component, not elsewhere classified: Secondary | ICD-10-CM

## 2020-03-12 DIAGNOSIS — G932 Benign intracranial hypertension: Secondary | ICD-10-CM

## 2020-03-12 DIAGNOSIS — Z8669 Personal history of other diseases of the nervous system and sense organs: Secondary | ICD-10-CM

## 2020-03-12 LAB — CSF CULTURE W GRAM STAIN: Culture: NO GROWTH

## 2020-03-12 LAB — VDRL, CSF: VDRL Quant, CSF: NONREACTIVE

## 2020-03-12 NOTE — Progress Notes (Signed)
Kindly inform the patient that CT scan of the head was normal

## 2020-03-12 NOTE — Telephone Encounter (Signed)
Pt called and stated that she had a spinal tap done at [facility] and is now experiencing headaches and is needing an order for a blood patch. Please advise.

## 2020-03-12 NOTE — Telephone Encounter (Signed)
Order placed, will make sure referrals area is notified to get the patient set up to get the patch.

## 2020-03-12 NOTE — Telephone Encounter (Signed)
IR.  Patient with history of headaches s/p LP in IR 03/09/2020 by Dr. Tery Sanfilippo. Received call from patient stating she still has headaches. Called patient at [phone] to discuss. Patient states that her headache is rated 10/10 and has not improved, even with laying flat. Advised patient to lay flat the rest of day. Discussed case with Dr. Earleen Newport who spoke with our spine center who will reach out to patient with further instructions.  Please call IR with questions/concerns.   Bea Graff Ravenne Wayment, PA-C 03/12/2020, 10:54 AM

## 2020-03-12 NOTE — Progress Notes (Signed)
Opening pressure was 16 cm water, not elevated for BMI, and while on medication. Patient requested Blood patch for spinal headaches today.

## 2020-03-12 NOTE — Telephone Encounter (Signed)
I have talked to Dr. Yisroel Ramming at [phone] They are trying to get patient in for blood patch today .

## 2020-03-12 NOTE — Progress Notes (Signed)
Pt called to radiology nurses station concerned about some symptoms she is having after her spinal tap on Friday 03/09/20. She states that the MD who performed the procedure told her to expect a mild headache but that she has a severe headache since Friday that is not going away. She does not state that she has taken any medication to relieve the pain. Pt also states that she is having pain at the site where spinal tap was performed. I advised that I will speak with PA/MD regarding her current symptoms and return her call.  Confirmed that her best call back number is [phone]

## 2020-03-13 ENCOUNTER — Ambulatory Visit (HOSPITAL_COMMUNITY)
Admission: RE | Admit: 2020-03-13 | Discharge: 2020-03-13 | Disposition: A | Discharge status: Home / Self Care | Payer: BC Managed Care – PPO | Source: Ambulatory Visit | Attending: Interventional Radiology | Admitting: Interventional Radiology

## 2020-03-13 ENCOUNTER — Other Ambulatory Visit: Payer: Self-pay

## 2020-03-13 ENCOUNTER — Encounter: Payer: Self-pay | Admitting: Neurology

## 2020-03-13 DIAGNOSIS — R51 Headache with orthostatic component, not elsewhere classified: Secondary | ICD-10-CM | POA: Diagnosis present

## 2020-03-13 HISTORY — PX: IR FL GUIDED LOC OF NEEDLE/CATH TIP FOR SPINAL INJECTION RT: IMG2397

## 2020-03-13 MED ORDER — IOPAMIDOL (ISOVUE-M 200) INJECTION 41%
INTRAMUSCULAR | Status: AC
  Filled 2020-03-13: qty 10

## 2020-03-13 MED ORDER — IOPAMIDOL (ISOVUE-M 200) INJECTION 41%
INTRAMUSCULAR | Status: AC | PRN
  Administered 2020-03-13: 7 mL via EPIDURAL

## 2020-03-13 MED ORDER — LIDOCAINE HCL (PF) 1 % IJ SOLN
INTRAMUSCULAR | Status: AC
  Filled 2020-03-13: qty 30

## 2020-03-13 MED ORDER — SODIUM CHLORIDE (PF) 0.9 % IJ SOLN
INTRAMUSCULAR | Status: AC
  Filled 2020-03-13: qty 10

## 2020-03-13 MED ORDER — LIDOCAINE HCL (PF) 1 % IJ SOLN
INTRAMUSCULAR | Status: AC | PRN
  Administered 2020-03-13: 10 mL

## 2020-03-13 NOTE — Discharge Instructions (Signed)
Please call Interventional Radiology clinic [phone] with any questions or concerns.  You may remove your dressing and shower tomorrow.  Epidural Blood Patch for Spinal Headache, Care After This sheet gives you information about how to care for yourself after your procedure. Your health care provider may also give you more specific instructions. If you have problems or questions, contact your health care provider. What can I expect after the procedure? After the procedure, it is common to have:  Relief from your headache almost right away, or slow easing of pain over the next 24 hours.  Mild backaches. These may last for a few days.  A mild feeling of prickly, tingly, or numb skin (paresthesia).  Neck pain.  Pain down your arm or leg (radiating pain). Follow these instructions at home:  Injection site care   Follow instructions from your health care provider about how to take care of your injection site. Make sure you: ? Wash your hands with soap and water before and after you change your bandage (dressing). If soap and water are not available, use hand sanitizer. ? Change or remove your dressing as told by your health care provider.  Check your injection area every day for signs of infection. Check for: ? Redness, swelling, or pain. ? Fluid or blood. ? Warmth. ? Pus or a bad smell. Activity   For the first 24 hours after the procedure, rest in bed as much as you can.  Do not drive for 24 hours if you were given a sedative during your procedure.  When picking up items from a low position, squat by bending your knees. Do not bend at the waist.  Avoid too much straining, including while using the toilet. This can cause the blood patch to move out of position and cause the headache to return.  Do not lift anything that is heavier than 10 lb (4.5 kg), or the limit that you are told, until your health care provider says that it is safe.  Return to your normal activities as  told by your health care provider. Ask your health care provider what activities are safe for you. General instructions  Drink enough fluids to keep your urine pale yellow.  Do not take baths, swim, or use a hot tub until your health care provider approves. Ask your health care provider if you may take showers. You may only be allowed to take sponge baths.  Take over-the-counter and prescription medicines only as told by your health care provider.  Keep all follow-up visits as told by your health care provider. This is important. Contact a health care provider if:  You have redness, swelling, or pain around your injection site.  You have fluid or blood coming from your injection site.  Your injection site feels warm to the touch.  You have pus or a bad smell coming from your injection site.  You have a fever.  You have back pain or pain that goes down your legs, or you have trouble with your bowels or bladder.  Your headache comes back. Get help right away if you have:  A very bad headache.  Nausea or vomiting. Summary  After the procedure, it is common to have relief from your headache, mild backaches, mild numbness, neck pain, and some pain down your arm or leg.  Follow instructions from your health care provider about how to take care of your injection site.  For the first 24 hours after the procedure, rest in bed as much  as you can.  Get help right away if you have a very bad headache, nausea, or vomiting. This information is not intended to replace advice given to you by your health care provider. Make sure you discuss any questions you have with your health care provider. Document Revised: 05/04/2019 Document Reviewed: 05/04/2019 Elsevier Patient Education  [place].

## 2020-03-13 NOTE — Procedures (Signed)
Interventional Radiology Procedure Note  Procedure: L2-L3 epidural blood patch  Complications: None  Estimated Blood Loss: None  Recommendations: - Bedrest flat x 1 hr - DC home   Signed,  Criselda Peaches, MD

## 2020-03-14 ENCOUNTER — Encounter: Payer: Self-pay | Admitting: Adult Health

## 2020-03-14 LAB — ANAEROBIC CULTURE

## 2020-03-15 ENCOUNTER — Telehealth: Payer: Self-pay | Admitting: Student

## 2020-03-15 ENCOUNTER — Telehealth: Payer: Self-pay | Admitting: Neurology

## 2020-03-15 MED ORDER — ACETAMINOPHEN-CODEINE #3 300-30 MG PO TABS: 1.0000 | ORAL_TABLET | Freq: Four times a day (QID) | ORAL | 0 refills | Status: AC | PRN

## 2020-03-15 NOTE — Telephone Encounter (Signed)
Called the patient back and discussed the site is not swollen or red. She states that her back is warm to touch. When she spoke with radiology they advised to contact us. At this time we encourage the patient to drink plenty of fluids. Dr Dohmeier has offered to send in a short supply of pain medication to hopefully assist with the pain she is having. Advised the patient that to rest and lay down as much as possible. Pt verbalized understanding. Informed her if the pain medication doesn't help and her back continues to also be bothersome patient may need to reach back out to the radiologist to address the blood patch and determine if worked well. Advised that she may have to go to ER if that does not work out. Pt verbalized understanding. Pt had no questions at this time but was encouraged to call back if questions arise. Pt was appreciative. Advised medication will be sent to [facility] on [address].

## 2020-03-15 NOTE — Telephone Encounter (Signed)
Pt called and states that she received her LP on Friday. She called earlier this week due to headache still present. Dr Dohmeier ordered a blood patch for the patient. She had that placed on Tuesday and they told her she should start to feel relief. Patient stated that tues evening the headache continued and when she woke up on wed headache was unbearable and her back is in excruciating pain in the area where the LP was completed. She has taken her topiramate as directed and ibuprofen and the pain is not letting up in either area head or LP site. Patient was in tears on the phone while explaining this. Pt is having nausea associated, encouraged the patient to take her zofran to see if that helps and advised I would discuss with our work in [place] and see what she recommends [name] to do for her.

## 2020-03-15 NOTE — Telephone Encounter (Signed)
PA notified of patient phone call to Radiology with complaint of headache and back pain.   PA called patient. She reports ongoing headache and back pain since her lumbar puncture 5/21.  She did present to [facility] hospital 5/25 for blood patch which has not relieved her symptoms.  She continues with constant headache not relieved by Topamax. Review of chart today shows a prescription for Tylenol with codeine was called into pharmacy for patient today by Dr. Brett Fairy.   Reviewed case with Dr. Laurence Ferrari.  Ms. [place] should continue to lie flat as much as possible through the weekend.  Rest and aggressively hydrate. If headache persists, she could be scheduled for a repeat blood patch ~1 week from last patch.   Discussed the above with the patient.  She verbalizes understanding.  She will reach out to [facility] if needed early next week.   Brynda Greathouse, MS RD PA-C

## 2020-03-16 ENCOUNTER — Other Ambulatory Visit: Payer: Self-pay

## 2020-03-20 ENCOUNTER — Encounter: Payer: Self-pay | Admitting: Family Medicine

## 2020-03-20 ENCOUNTER — Encounter: Payer: Self-pay | Admitting: Adult Health

## 2020-03-20 ENCOUNTER — Ambulatory Visit: Payer: BC Managed Care – PPO | Attending: Adult Health | Admitting: Physical Therapy

## 2020-03-20 ENCOUNTER — Other Ambulatory Visit: Payer: Self-pay

## 2020-03-20 ENCOUNTER — Encounter: Payer: Self-pay | Admitting: Physical Therapy

## 2020-03-20 ENCOUNTER — Ambulatory Visit (INDEPENDENT_AMBULATORY_CARE_PROVIDER_SITE_OTHER): Payer: BC Managed Care – PPO | Admitting: Adult Health

## 2020-03-20 VITALS — BP 134/80 | Temp 97.7°F | Wt >= 6400 oz

## 2020-03-20 DIAGNOSIS — M545 Low back pain: Secondary | ICD-10-CM | POA: Diagnosis not present

## 2020-03-20 DIAGNOSIS — M542 Cervicalgia: Secondary | ICD-10-CM | POA: Diagnosis not present

## 2020-03-20 DIAGNOSIS — R079 Chest pain, unspecified: Secondary | ICD-10-CM

## 2020-03-20 DIAGNOSIS — M5412 Radiculopathy, cervical region: Secondary | ICD-10-CM | POA: Diagnosis not present

## 2020-03-20 DIAGNOSIS — G8929 Other chronic pain: Secondary | ICD-10-CM | POA: Diagnosis not present

## 2020-03-20 DIAGNOSIS — R293 Abnormal posture: Secondary | ICD-10-CM | POA: Diagnosis not present

## 2020-03-20 DIAGNOSIS — R519 Headache, unspecified: Secondary | ICD-10-CM

## 2020-03-20 NOTE — Therapy (Signed)
[place], [place], [address] Phone: [phone]   Fax:  [phone]  Physical Therapy Evaluation  Patient Details  Name: Maureen Lewis MRN: [id number] Date of Birth: May 06, 1996 Referring Provider (PT): Nafziger, Haynes Dage   Encounter Date: 03/20/2020  PT End of Session - 03/20/20 1724    Visit Number  1    Number of Visits  13    Date for PT Re-Evaluation  05/01/20    Authorization Type  BCBS, MCD secondary submittedon 6/1    PT Start Time  1546    PT Stop Time  1630    PT Time Calculation (min)  44 min    Activity Tolerance  Patient tolerated treatment well    Behavior During Therapy  [facility] for tasks assessed/performed       Past Medical History:  Diagnosis Date   Abnormal uterine bleeding    Acid reflux    Alpha thalassemia trait    Amenorrhea    Anemia    no current med.   Cesarean delivery delivered 10/11/2019   10/09/2019 - primary CS for failed IOL   Complication of anesthesia    states had to keep giving her anesthesia during EGD   Constipation    Depression    "I'm good"   Finger mass, left 03/2017   middle finger   Gestational diabetes    Gestational diabetes 05/23/2019   Current Diabetic Medications:  None  [ ]  Aspirin 81 mg daily after 12 weeks (? A2/B GDM)  Required Referrals for A1GDM or A2GDM: [ ]  Diabetes Education and Testing Supplies [ ]  Nutrition Cousult  For A2/B GDM or higher classes of DM [ ]  Diabetes Education and Testing Supplies [ ]  Nutrition Counsult [ ]  Fetal ECHO after 20 weeks  [ ]  Eye exam for retina evaluation   Baseline and surveillance labs (   Gestational hypertension 09/23/2019   Guidelines for Antenatal Testing and Sonography  (with updated ICD-10 codes)  Updated  09/19/2019 with Dr. Tama High  INDICATION U/S 2 X week NST/AFI  or full BPP wkly DELIVERY Diabetes   A1 - good control - O24.410    A2 - good control - O24.419      A2  - poor control or poor  compliance - O24.419, E11.65   (Macrosomia or polyhydramnios) **E11.65 is extra code for poor control**    A2/B - O24.   Headache    Infection    UTI   Irritable bowel syndrome (IBS)    Morbid obesity with body mass index (BMI) of 45.0 to 49.9 in adult [facility])    Obesity during pregnancy, antepartum 04/06/2019   Body mass index is 53.71 kg/m.  Recommendations [x]  Aspirin 81 mg daily after 12 weeks; discontinue after 36 weeks [ ]  Nutrition consult [ ]  Weight gain 11-20 lbs for singleton and 25-35 lbs for twin pregnancy (IOM guidelines) Higher class of obesity patients recommended to gain closer to lower limit  Weight loss is associated with adverse outcomes [ ]  Baseline and surveillance labs (pulled in fr   Plantar fasciitis, bilateral    Polycystic ovary syndrome    Prediabetes    "prediabetes"   Pregnancy induced hypertension    Shortness of breath    Sleep apnea    no CPAP use   Supervision of normal first pregnancy 04/06/2019   [name]: [ x]Initial [ x]12 [ ] 20 [ ] 28 [ ] 32 [ ] 36 [ ] 38 [ ] 39 [ ] 40  Nursing Staff Provider Office Location [facility]  Dating   LMP Language  English  Anatomy US  Nml Flu Vaccine  Declined 07/28/19 Genetic Screen  NIPS: low risks   AFP:   negative  TDaP vaccine   08/25/19 Hgb A1C or  GTT Early A1C 5.8 Third trimester: GDM insulin Rhogam  NA   LAB RESULTS  Feeding Plan Breast Blood Type   Vertigo 2017    Past Surgical History:  Procedure Laterality Date   CESAREAN SECTION N/A 10/09/2019   Procedure: CESAREAN SECTION;  Surgeon: Aletha Halim, MD;  Location: [facility] LD [facility]S;  Service: Obstetrics;  Laterality: N/A;   COLONOSCOPY WITH PROPOFOL  10/07/2016   ESOPHAGOGASTRODUODENOSCOPY  12/21/2015   EXCISION MASS UPPER EXTREMETIES Left 04/21/2017   Procedure: EXCISION MASS LEFT MIDDLE FINGER;  Surgeon: Daryll Brod, MD;  Location: [facility];  Service: Orthopedics;  Laterality: Left;   IR FL GUIDED LOC OF NEEDLE/CATH TIP FOR SPINAL  INJECTION RT  03/13/2020    There were no vitals filed for this visit.   Subjective Assessment - 03/20/20 1552    Subjective  pt is a 24 y.o F with CC of neck and chest pain that started about 2 months ago. She reports she is unsure as to what caused it but it started with chest tightness which she went to the ED which the MD noted she had a lot of inflammation and tension. Since onset the pain seems to be the same, with some increased soreness following a spinal tap 2 weeks ago. she reports increased incidence of HA but denies any other issues.    How long can you sit comfortably?  unlimited    How long can you stand comfortably?  unlimited    How long can you walk comfortably?  unlimited    Diagnostic tests  02/21/2020 x-ray IMPRESSION:Negative cervical spine radiographs. head CT scan 03/09/2020 IMPRESSION:  Normal CT scan of the head without contrast    Patient Stated Goals  to get better, reduce pain,    Currently in Pain?  Yes    Pain Score  7    at worst 10/10   Pain Location  Neck    Pain Orientation  Right;Left    Pain Descriptors / Indicators  Aching;Sore;Tightness    Pain Type  Chronic pain    Pain Onset  More than a month ago    Pain Frequency  Constant    Aggravating Factors   any activities that involves the UE, any neck movements    Pain Relieving Factors  muscle relaxer, heating pad, ice    Effect of Pain on Daily Activities  limited UE movement         [facility] PT Assessment - 03/20/20 0001      Assessment   Medical Diagnosis  Chest pain, unspecified type R07.9, Cervical radiculopathy M54.12    Referring Provider (PT)  Nafziger, Haynes Dage    Onset Date/Surgical Date  --   2 months ago   Hand Dominance  Right    Next MD Visit  make on PRN    Prior Therapy  no      Precautions   Precaution Comments  no heavy lifting, no bending, no pulling      Restrictions   Weight Bearing Restrictions  No      Balance Screen   Has the patient fallen in the past 6 months  No       [place]  Private residence    Freight forwarder;Children    Available Help at Discharge  Family    Type of [place] to enter    Entrance Stairs-Number of Steps  6    Entrance Stairs-Rails  Can reach both    [place]  One level      Prior Function   Level of Independence  Independent    Vocation  Full time employment   Research scientist (medical) / customer service   Vocation Requirements  prolong standing, lifting / standing/ walking      Cognition   Overall Cognitive Status  Within Functional Limits for tasks assessed      Observation/Other Assessments   Focus on Therapeutic Outcomes (FOTO)   not set-up in FOTO      Posture/Postural Control   Posture/Postural Control  Postural limitations    Postural Limitations  Rounded Shoulders;Forward head;Flexed trunk      ROM / Strength   AROM / PROM / Strength  AROM;Strength;PROM      AROM   AROM Assessment Site  Cervical;Shoulder    Cervical Flexion  32   concordant pain at end range    Cervical Extension  32    Cervical - Right Side Bend  40   concordant pain at mid range   Cervical - Left Side Bend  40   concordant pain at mid range   Cervical - Right Rotation  69   concordant pain at end range   Cervical - Left Rotation  66   concordant pain at end range     Strength   Overall Strength  Within functional limits for tasks performed    Overall Strength Comments  pain noted in bil upper trap during resisted flexion/ abduction    Strength Assessment Site  Shoulder    Right/Left Shoulder  Right;Left      Palpation   Palpation comment  TTP along bil upper trap/ levator scpauale with multiple tirgger points and along the cervical parapsinals into the sub-occipitals.                  Objective measurements completed on examination: See above findings.      [facility] Adult PT Treatment/Exercise - 03/20/20 0001      Exercises   Exercises  Neck       Manual Therapy   Manual therapy comments  MTPR along bil upper trap/ levator scapulae      Neck Exercises: Stretches   Upper Trapezius Stretch  Right;30 seconds;Left;1 rep    Levator Stretch  Right;30 seconds;1 rep;Left             PT Education - 03/20/20 1733    Education Details  evaluation findings, POC, goals, HEP with proper form/ rationale. anatomy of areas involved.    Person(s) Educated  Patient    Methods  Explanation;Verbal cues;Handout    Comprehension  Verbalized understanding;Verbal cues required       PT Short Term Goals - 03/20/20 1733      PT SHORT TERM GOAL #1   Title  pt to be I with inital HEP    Baseline  no previous HEP    Time  3    Period  Weeks    Status  New    Target Date  04/10/20      PT SHORT TERM GOAL #2   Title  pt to verbalize and demo efficient posture and lifting mechanics  Baseline  no knowlege of posture    Time  3    Period  Weeks    Status  New    Target Date  04/10/20        PT Long Term Goals - 03/20/20 1735      PT LONG TERM GOAL #1   Title  pt to maintain cervical ROM with </= 1/10 pain for functional ROM required for ADLS    Baseline  functional ROM in all planes but noted 6-7/10 pain    Time  6    Period  Weeks    Status  New    Target Date  05/01/20      PT LONG TERM GOAL #2   Title  pt to maintain bil shoudler strenght with no report of pain for functional lifting/ carrying activitis for work and ADLs    Baseline  functional strength but 6-7/10 poain during testing of flexion/ abduction    Time  6    Period  Weeks    Status  New    Target Date  05/01/20      PT LONG TERM GOAL #3   Title  pt to rpeort decreased spasm in bil upper trap and surrounding musculature to promote neck ROM with </= 2/10 pain    Baseline  spasm noted in bil upper trap/ levator scapulae with 6-7/10 pain    Time  6    Period  Weeks    Status  New    Target Date  05/01/20      PT LONG TERM GOAL #4   Title  pt to be I with all  HEP given to maintain and progress current level of function    Baseline  no previou HEP    Time  6    Period  Weeks    Status  New    Target Date  05/01/20      PT LONG TERM GOAL #5   Title  -    Baseline  -             Plan - 03/20/20 1724    Clinical Impression Statement  pt presents to OPPT with CC of neck and chest pain starting around 2 months ago with no specific onset. She has functional cervilca ROM with end range concordant pain noted in all planes and with shoulder abduction/ extension resistive testing. TTP along bil upper trap / levator scapulae. She responded well to manual trigger point techniques in session noting decreased pain/ stiffness she would benefit from physical therapy to decrease neck pain, reduce muscle tension, and return to PLOF by addressing the deficits listed.    Personal Factors and Comorbidities  Age;Past/Current Experience;Comorbidity 3+    Comorbidities  hx of depression, DM, vertigo    Stability/Clinical Decision Making  Evolving/Moderate complexity    Clinical Decision Making  Moderate    Rehab Potential  Good    PT Frequency  2x / week    PT Duration  6 weeks    PT Treatment/Interventions  ADLs/Self Care Home Management;Electrical Stimulation;Iontophoresis 4mg /ml Dexamethasone;Dry needling;Moist Heat;Ultrasound;Therapeutic activities;Therapeutic exercise;Balance training;Neuromuscular re-education;Manual techniques;Passive range of motion;Patient/family education;Taping    PT Next Visit Plan  review / update HEP, discuss DN for upper trap/ levator scpauale, posture enducation, posterior shoulder strengthening,    PT Home Exercise Plan  [id number] -    Consulted and Agree with Plan of Care  Patient       Patient will benefit from skilled therapeutic intervention  in order to improve the following deficits and impairments:  Obesity, Improper body mechanics, Increased muscle spasms, Decreased strength, Postural dysfunction, Pain, Decreased activity  tolerance, Decreased endurance, Decreased range of motion  Visit Diagnosis: Cervicalgia  Abnormal posture     Problem List Patient Active Problem List   Diagnosis Date Noted   Postpartum care following cesarean delivery 11/22/2019   Contraceptive management 11/22/2019   Positive RPR test 10/09/2019   Migraine headache 07/12/2019   Alpha thalassemia trait    Vitamin D deficiency 09/16/2017   Prediabetes 09/16/2017   Depression 09/16/2017   Class 3 severe obesity with serious comorbidity and body mass index (BMI) of 50.0 to 59.9 in adult [facility]) 09/16/2017   PCOS (polycystic ovarian syndrome) 08/13/2017   Mass 05/06/2017   Vertigo 10/06/2016   Anemia 09/26/2016   Pseudotumor cerebri syndrome 08/28/2016   Obesity hypoventilation syndrome ([place]) 08/28/2016   Benign paroxysmal positional vertigo due to bilateral vestibular disorder 08/28/2016   OSA (obstructive sleep apnea) 08/28/2016   Hypersomnia with sleep apnea 08/28/2016   Female hirsutism 08/28/2016   GERD (gastroesophageal reflux disease) 02/24/2015   Constipation 10/02/2014   [place]arr [place] PT, DPT, LAT, ATC  03/20/20  5:41 PM      [facility] [address] [place], [place], [address] Phone: [phone]   Fax:  [phone]  Name: Maureen Lewis MRN: [id number] Date of Birth: 05-27-1996

## 2020-03-20 NOTE — Progress Notes (Signed)
Culture is negative.

## 2020-03-20 NOTE — Progress Notes (Signed)
Subjective:    Patient ID: Maureen Lewis, female    DOB: Nov 07, 1995, 24 y.o.   MRN: [id number]  HPI  24 year old female who  has a past medical history of Abnormal uterine bleeding, Acid reflux, Alpha thalassemia trait, Amenorrhea, Anemia, Cesarean delivery delivered (04/88/8916), Complication of anesthesia, Constipation, Depression, Finger mass, left (03/2017), Gestational diabetes, Gestational diabetes (05/23/2019), Gestational hypertension (09/23/2019), Headache, Infection, Irritable bowel syndrome (IBS), Morbid obesity with body mass index (BMI) of 45.0 to 49.9 in adult [facility]), Obesity during pregnancy, antepartum (04/06/2019), Plantar fasciitis, bilateral, Polycystic ovary syndrome, Prediabetes, Pregnancy induced hypertension, Shortness of breath, Sleep apnea, and Supervision of normal first pregnancy (04/06/2019).  She presents to the office today for follow up regarding cervical spine and chest pain.  He was originally seen for this issue on 02/21/2020 after she had presented to the emergency room on 02/16/2028 for chest pain.  Her work-up in the ER including EKG, chest x-ray, CBC, BMP, troponin were all negative.  We have trialed her on prednisone therapy as well as muscle relaxers which have not been helpful.  Her blood work with myself has shown a positive ANA and elevated sed rate.  She has been referred to her rheumatology but does not have an appointment until August.  She continues to complain of chest pain and cervical spine pain feels as though is getting slightly worse.  Additionally, she had a lumbar puncture done on 03/09/2020 and then presented to [facility] hospital on 525 for a blood patch due to chronic headache that was not relieved by Topamax.  She reports that she was not able to tolerate the full procedure for the blood patch because she not have enough anesthesia and felt the entire procedure and eventually cannot proceed.  She continues to have a headache that is constant and is not  relieved by Topamax.  She has been advised by her neurologist to follow back up with radiology as she may need to have another blood patch done.  She is skeptical about going through this procedure again  Review of Systems See HPI   Past Medical History:  Diagnosis Date  . Abnormal uterine bleeding   . Acid reflux   . Alpha thalassemia trait   . Amenorrhea   . Anemia    no current med.  . Cesarean delivery delivered 10/11/2019   10/09/2019 - primary CS for failed IOL  . Complication of anesthesia    states had to keep giving her anesthesia during EGD  . Constipation   . Depression    "I'm good"  . Finger mass, left 03/2017   middle finger  . Gestational diabetes   . Gestational diabetes 05/23/2019   Current Diabetic Medications:  None  '[ ]'$  Aspirin 81 mg daily after 12 weeks (? A2/B GDM)  Required Referrals for A1GDM or A2GDM: '[ ]'$  Diabetes Education and Testing Supplies '[ ]'$  Nutrition Cousult  For A2/B GDM or higher classes of DM '[ ]'$  Diabetes Education and Testing Supplies '[ ]'$  Nutrition Counsult '[ ]'$  Fetal ECHO after 20 weeks  '[ ]'$  Eye exam for retina evaluation   Baseline and surveillance labs (  . Gestational hypertension 09/23/2019   Guidelines for Antenatal Testing and Sonography  (with updated ICD-10 codes)  Updated  2019-09-12 with Dr. Tama High  INDICATION U/S 2 X week NST/AFI  or full BPP wkly DELIVERY Diabetes   A1 - good control - O24.410    A2 - good control - O24.419  A2  - poor control or poor compliance - O24.419, E11.65   (Macrosomia or polyhydramnios) **E11.65 is extra code for poor control**    A2/B - O24.  Marland Kitchen Headache   . Infection    UTI  . Irritable bowel syndrome (IBS)   . Morbid obesity with body mass index (BMI) of 45.0 to 49.9 in adult [facility])   . Obesity during pregnancy, antepartum 04/06/2019   Body mass index is 53.71 kg/m.  Recommendations '[x]'$  Aspirin 81 mg daily after 12 weeks; discontinue after 36 weeks '[ ]'$  Nutrition consult '[ ]'$  Weight gain 11-20 lbs for  singleton and 25-35 lbs for twin pregnancy (IOM guidelines) Higher class of obesity patients recommended to gain closer to lower limit  Weight loss is associated with adverse outcomes '[ ]'$  Baseline and surveillance labs (pulled in fr  . Plantar fasciitis, bilateral   . Polycystic ovary syndrome   . Prediabetes    "prediabetes"  . Pregnancy induced hypertension   . Shortness of breath   . Sleep apnea    no CPAP use  . Supervision of normal first pregnancy 04/06/2019   [name]: [ x]Initial [ x]12 '[ ]'$ 20 '[ ]'$ 28 '[ ]'$ 32 '[ ]'$ 36 '[ ]'$ 38 '[ ]'$ 39 '[ ]'$ 40 Nursing Staff Provider Office Location [facility]  Dating   LMP Language  English  Anatomy US  Nml Flu Vaccine  Declined 07/28/19 Genetic Screen  NIPS: low risks   AFP:   negative  TDaP vaccine   08/25/19 Hgb A1C or  GTT Early A1C 5.8 Third trimester: GDM insulin Rhogam  NA   LAB RESULTS  Feeding Plan Breast Blood Type    Social History   Socioeconomic History  . Marital status: Single    Spouse name: Not on file  . Number of children: Not on file  . Years of education: Not on file  . Highest education level: Not on file  Occupational History  . Occupation: Chemical engineer: [employer]  Tobacco Use  . Smoking status: Never Smoker  . Smokeless tobacco: Never Used  Substance and Sexual Activity  . Alcohol use: Not Currently    Comment: social   . Drug use: No  . Sexual activity: Yes    Birth control/protection: None  Other Topics Concern  . Not on file  Social History Narrative   Right handed    Soda sometimes   Lives with mom and cousin, a grandmother in [place] also helps care for her.       She is in nursing school.    Social Determinants of Health   Financial Resource Strain:   . Difficulty of Paying Living Expenses:   Food Insecurity:   . Worried About Charity fundraiser in the Last Year:   . Arboriculturist in the Last Year:   Transportation Needs:   . Film/video editor (Medical):   Marland Kitchen Lack of  Transportation (Non-Medical):   Physical Activity:   . Days of Exercise per Week:   . Minutes of Exercise per Session:   Stress:   . Feeling of Stress :   Social Connections:   . Frequency of Communication with Friends and Family:   . Frequency of Social Gatherings with Friends and Family:   . Attends Religious Services:   . Active Member of Clubs or Organizations:   . Attends Archivist Meetings:   Marland Kitchen Marital Status:   Intimate Partner Violence:   . Fear of Current or Ex-Partner:   .  Emotionally Abused:   Marland Kitchen Physically Abused:   . Sexually Abused:     Past Surgical History:  Procedure Laterality Date  . CESAREAN SECTION N/A 10/09/2019   Procedure: CESAREAN SECTION;  Surgeon: Aletha Halim, MD;  [facility]: [facility];  Service: Obstetrics;  Laterality: N/A;  . COLONOSCOPY WITH PROPOFOL  10/07/2016  . ESOPHAGOGASTRODUODENOSCOPY  12/21/2015  . EXCISION MASS UPPER EXTREMETIES Left 04/21/2017   Procedure: EXCISION MASS LEFT MIDDLE FINGER;  Surgeon: Daryll Brod, MD;  Location: [facility];  Service: Orthopedics;  Laterality: Left;  . IR FL GUIDED LOC OF NEEDLE/CATH TIP FOR SPINAL INJECTION RT  03/13/2020    Family History  Problem Relation Age of Onset  . Diabetes Maternal Aunt   . Hypertension Maternal Uncle   . Depression Maternal Grandfather   . Diabetes Maternal Grandfather   . Hypertension Maternal Grandfather   . Arthritis Mother   . High blood pressure Mother   . Depression Mother   . Sleep apnea Mother   . Obesity Mother     Allergies  Allergen Reactions  . Bee Pollen Hives, Shortness Of Breath and Swelling  . Bee Venom Hives, Shortness Of Breath and Swelling  . Hydrocodone-Acetaminophen Anaphylaxis    No problem when she takes Tylenol  . Peach Flavor Hives, Shortness Of Breath and Other (See Comments)    SWELLING OF MOUTH  . Pollen Extract Hives, Shortness Of Breath and Swelling    Current Outpatient Medications on File Prior to Visit    Medication Sig Dispense Refill  . acetaminophen-codeine (TYLENOL #3) 300-30 MG tablet Take 1 tablet by mouth every 6 (six) hours as needed for up to 5 days for moderate pain. 20 tablet 0  . amLODipine (NORVASC) 10 MG tablet Take 1 tablet (10 mg total) by mouth daily. 30 tablet 2  . baclofen (LIORESAL) 10 MG tablet Take 1 tablet (10 mg total) by mouth 2 (two) times daily. 30 each 0  . Blood Pressure Monitoring KIT 1 kit by Does not apply route once a week. 1 kit 0  . ferrous sulfate 325 (65 FE) MG tablet Take 1 tablet (325 mg total) by mouth every other day. 45 tablet 1  . hydrochlorothiazide (HYDRODIURIL) 25 MG tablet Take 1 tablet (25 mg total) by mouth daily. 30 tablet 2  . loratadine (CLARITIN) 10 MG tablet Take 10 mg by mouth daily as needed for allergies.     . medroxyPROGESTERone (DEPO-PROVERA) 150 MG/ML injection Inject 1 mL (150 mg total) into the muscle every 3 (three) months. 1 mL 3  . ondansetron (ZOFRAN-ODT) 4 MG disintegrating tablet Take 4 mg by mouth every 8 (eight) hours as needed.    . polyethylene glycol powder (GLYCOLAX/MIRALAX) 17 GM/SCOOP powder     . Prenatal Vit-Fe Fumarate-FA (PRENATAL VITAMINS PO) Take by mouth.    . topiramate (TOPAMAX) 25 MG tablet Take 2 tablets (50 mg total) by mouth 2 (two) times daily. 180 tablet 1   No current facility-administered medications on file prior to visit.    BP 134/80   Temp 97.7 F (36.5 C)   Wt (!) 431 lb (195.5 kg)   BMI 54.60 kg/m       Objective:   Physical Exam Vitals and nursing note reviewed.  Constitutional:      Appearance: Normal appearance. She is obese.  Cardiovascular:     Rate and Rhythm: Normal rate.     Pulses: Normal pulses.     Heart sounds: Normal heart sounds.  Pulmonary:     Effort: Pulmonary effort is normal.     Breath sounds: Normal breath sounds.  Musculoskeletal:        General: Tenderness (lumbar spine) present. Normal range of motion.  Skin:    General: Skin is warm.  Neurological:      General: No focal deficit present.     Mental Status: She is alert and oriented to person, place, and time.  Psychiatric:        Mood and Affect: Mood normal.        Behavior: Behavior normal.        Thought Content: Thought content normal.        Judgment: Judgment normal.       Assessment & Plan:  1. Chest pain, unspecified type  - Ambulatory referral to Physical Therapy  2. Cervical radiculopathy  - Ambulatory referral to Physical Therapy  3. Chronic nonintractable headache, unspecified headache type -Advised there was not much I could do for her headache after a lumbar puncture and that she may need to have another blood patch done.  She will call radiology if she decides she wants to redo this procedure.  Dorothyann Peng, NP

## 2020-03-23 DIAGNOSIS — Z0279 Encounter for issue of other medical certificate: Secondary | ICD-10-CM

## 2020-03-26 ENCOUNTER — Other Ambulatory Visit: Payer: Self-pay

## 2020-03-26 ENCOUNTER — Ambulatory Visit: Payer: BC Managed Care – PPO | Attending: Internal Medicine

## 2020-03-26 ENCOUNTER — Ambulatory Visit (INDEPENDENT_AMBULATORY_CARE_PROVIDER_SITE_OTHER): Payer: BC Managed Care – PPO | Admitting: Neurology

## 2020-03-26 DIAGNOSIS — E662 Morbid (severe) obesity with alveolar hypoventilation: Secondary | ICD-10-CM

## 2020-03-26 DIAGNOSIS — G4710 Hypersomnia, unspecified: Secondary | ICD-10-CM

## 2020-03-26 DIAGNOSIS — Z20822 Contact with and (suspected) exposure to covid-19: Secondary | ICD-10-CM

## 2020-03-26 DIAGNOSIS — Z8669 Personal history of other diseases of the nervous system and sense organs: Secondary | ICD-10-CM

## 2020-03-26 DIAGNOSIS — G932 Benign intracranial hypertension: Secondary | ICD-10-CM

## 2020-03-26 DIAGNOSIS — O24410 Gestational diabetes mellitus in pregnancy, diet controlled: Secondary | ICD-10-CM

## 2020-03-27 LAB — SARS-COV-2, NAA 2 DAY TAT

## 2020-03-27 LAB — NOVEL CORONAVIRUS, NAA: SARS-CoV-2, NAA: NOT DETECTED

## 2020-03-30 LAB — CULTURE, FUNGUS WITHOUT SMEAR

## 2020-04-03 ENCOUNTER — Encounter: Payer: Self-pay | Admitting: Adult Health

## 2020-04-05 ENCOUNTER — Other Ambulatory Visit: Payer: Self-pay | Admitting: Adult Health

## 2020-04-05 ENCOUNTER — Encounter: Payer: Self-pay | Admitting: Physical Therapy

## 2020-04-05 ENCOUNTER — Other Ambulatory Visit: Payer: Self-pay

## 2020-04-05 ENCOUNTER — Ambulatory Visit: Payer: BC Managed Care – PPO | Admitting: Physical Therapy

## 2020-04-05 DIAGNOSIS — M545 Low back pain: Secondary | ICD-10-CM | POA: Diagnosis not present

## 2020-04-05 DIAGNOSIS — R293 Abnormal posture: Secondary | ICD-10-CM

## 2020-04-05 DIAGNOSIS — M542 Cervicalgia: Secondary | ICD-10-CM

## 2020-04-05 DIAGNOSIS — G8929 Other chronic pain: Secondary | ICD-10-CM | POA: Diagnosis not present

## 2020-04-05 DIAGNOSIS — M5416 Radiculopathy, lumbar region: Secondary | ICD-10-CM

## 2020-04-05 NOTE — Therapy (Signed)
[place], [place], [address] Phone: [phone]   Fax:  [phone]  Physical Therapy Treatment  Patient Details  Name: Maureen Lewis MRN: [id number] Date of Birth: 1996-01-26 Referring Provider (PT): Nafziger, Haynes Dage   Encounter Date: 04/05/2020   PT End of Session - 04/05/20 1221    Visit Number 2    Number of Visits 13    Date for PT Re-Evaluation 05/01/20    Authorization Type BCBS, MCD secondary submittedon 6/1    Authorization Time Period 04/05/2020 - 04/25/2020    Authorization - Visit Number 1    Authorization - Number of Visits 3    PT Start Time 2951    PT Stop Time 1225    PT Time Calculation (min) 40 min    Behavior During Therapy [facility] for tasks assessed/performed           Past Medical History:  Diagnosis Date  . Abnormal uterine bleeding   . Acid reflux   . Alpha thalassemia trait   . Amenorrhea   . Anemia    no current med.  . Cesarean delivery delivered 10/11/2019   10/09/2019 - primary CS for failed IOL  . Complication of anesthesia    states had to keep giving her anesthesia during EGD  . Constipation   . Depression    "I'm good"  . Finger mass, left 03/2017   middle finger  . Gestational diabetes   . Gestational diabetes 05/23/2019   Current Diabetic Medications:  None  [ ]  Aspirin 81 mg daily after 12 weeks (? A2/B GDM)  Required Referrals for A1GDM or A2GDM: [ ]  Diabetes Education and Testing Supplies [ ]  Nutrition Cousult  For A2/B GDM or higher classes of DM [ ]  Diabetes Education and Testing Supplies [ ]  Nutrition Counsult [ ]  Fetal ECHO after 20 weeks  [ ]  Eye exam for retina evaluation   Baseline and surveillance labs (  . Gestational hypertension 09/23/2019   Guidelines for Antenatal Testing and Sonography  (with updated ICD-10 codes)  Updated  2019-09-20 with Dr. Tama High  INDICATION U/S 2 X week NST/AFI  or full BPP wkly DELIVERY Diabetes   A1 - good control -  O24.410    A2 - good control - O24.419      A2  - poor control or poor compliance - O24.419, E11.65   (Macrosomia or polyhydramnios) **E11.65 is extra code for poor control**    A2/B - O24.  Marland Kitchen Headache   . Infection    UTI  . Irritable bowel syndrome (IBS)   . Morbid obesity with body mass index (BMI) of 45.0 to 49.9 in adult [facility])   . Obesity during pregnancy, antepartum 04/06/2019   Body mass index is 53.71 kg/m.  Recommendations [x]  Aspirin 81 mg daily after 12 weeks; discontinue after 36 weeks [ ]  Nutrition consult [ ]  Weight gain 11-20 lbs for singleton and 25-35 lbs for twin pregnancy (IOM guidelines) Higher class of obesity patients recommended to gain closer to lower limit  Weight loss is associated with adverse outcomes [ ]  Baseline and surveillance labs (pulled in fr  . Plantar fasciitis, bilateral   . Polycystic ovary syndrome   . Prediabetes    "prediabetes"  . Pregnancy induced hypertension   . Shortness of breath   . Sleep apnea    no CPAP use  . Supervision of normal first pregnancy 04/06/2019   [name]: [ x]Initial [ x]12 [ ]   20 [ ] 28 [ ] 32 [ ] 36 [ ] 38 [ ] 39 [ ] 40 Nursing Staff Provider Office Location [facility]  Dating   LMP Language  English  Anatomy US  Nml Flu Vaccine  Declined 07/28/19 Genetic Screen  NIPS: low risks   AFP:   negative  TDaP vaccine   08/25/19 Hgb A1C or  GTT Early A1C 5.8 Third trimester: GDM insulin Rhogam  NA   LAB RESULTS  Feeding Plan Breast Blood Type  . Vertigo 2017    Past Surgical History:  Procedure Laterality Date  . CESAREAN SECTION N/A 10/09/2019   Procedure: CESAREAN SECTION;  Surgeon: Aletha Halim, MD;  [facility];  Service: Obstetrics;  Laterality: N/A;  . COLONOSCOPY WITH PROPOFOL  10/07/2016  . ESOPHAGOGASTRODUODENOSCOPY  12/21/2015  . EXCISION MASS UPPER EXTREMETIES Left 04/21/2017   Procedure: EXCISION MASS LEFT MIDDLE FINGER;  Surgeon: Daryll Brod, MD;  Location: [facility];  Service: Orthopedics;   Laterality: Left;  . IR FL GUIDED LOC OF NEEDLE/CATH TIP FOR SPINAL INJECTION RT  03/13/2020    There were no vitals filed for this visit.   Subjective Assessment - 04/05/20 1151    Subjective " I am still having pain inthe back and the shoulders 8/10 and the back is a 10/10"    Diagnostic tests 02/21/2020 x-ray IMPRESSION:Negative cervical spine radiographs. head CT scan 03/09/2020 IMPRESSION:  Normal CT scan of the head without contrast    Currently in Pain? Yes    Pain Score 8     Pain Location Neck    Pain Orientation Right;Left              [facility] PT Assessment - 04/05/20 0001      Assessment   Medical Diagnosis Chest pain, unspecified type R07.9, Cervical radiculopathy M54.12    Referring Provider (PT) Nafziger, Haynes Dage                         [facility] Adult PT Treatment/Exercise - 04/05/20 0001      Neck Exercises: Supine   Neck Retraction 10 reps;5 secs    Capital Flexion 10 reps;5 secs   chin tuck head lift     Shoulder Exercises: Supine   Protraction Strengthening;15 reps;Both    Other Supine Exercises pushing out on pilates ring combined with bil shoulder flexion  2 x 10    to promote lower trap strengthening.      Shoulder Exercises: Seated   Horizontal ABduction Strengthening;Both;10 reps;Theraband   x 2 sets   Theraband Level (Shoulder Horizontal ABduction) Level 3 (Green)      Manual Therapy   Manual Therapy Soft tissue mobilization;Other (comment)    Manual therapy comments skilled palpation and monitoring of pt throughout TPDN    Soft tissue mobilization IASTM along R upper trap    Other Manual Therapy R upper trap inhibition taping   trial     Neck Exercises: Stretches   Upper Trapezius Stretch Right;30 seconds;1 rep    Levator Stretch Right;1 rep;30 seconds            Trigger Point Dry Needling - 04/05/20 0001    Consent Given? Yes    Education Handout Provided Yes    Muscles Treated Head and Neck Upper trapezius    Upper  Trapezius Response Twitch reponse elicited;Palpable increased muscle length   R only               PT Education -  04/05/20 1233    Education Details reviewed HEP    Person(s) Educated Patient    Methods Explanation;Verbal cues    Comprehension Verbalized understanding;Verbal cues required            PT Short Term Goals - 03/20/20 1733      PT SHORT TERM GOAL #1   Title pt to be I with inital HEP    Baseline no previous HEP    Time 3    Period Weeks    Status New    Target Date 04/10/20      PT SHORT TERM GOAL #2   Title pt to verbalize and demo efficient posture and lifting mechanics    Baseline no knowlege of posture    Time 3    Period Weeks    Status New    Target Date 04/10/20             PT Long Term Goals - 03/20/20 1735      PT LONG TERM GOAL #1   Title pt to maintain cervical ROM with </= 1/10 pain for functional ROM required for ADLS    Baseline functional ROM in all planes but noted 6-7/10 pain    Time 6    Period Weeks    Status New    Target Date 05/01/20      PT LONG TERM GOAL #2   Title pt to maintain bil shoudler strenght with no report of pain for functional lifting/ carrying activitis for work and ADLs    Baseline functional strength but 6-7/10 poain during testing of flexion/ abduction    Time 6    Period Weeks    Status New    Target Date 05/01/20      PT LONG TERM GOAL #3   Title pt to rpeort decreased spasm in bil upper trap and surrounding musculature to promote neck ROM with </= 2/10 pain    Baseline spasm noted in bil upper trap/ levator scapulae with 6-7/10 pain    Time 6    Period Weeks    Status New    Target Date 05/01/20      PT LONG TERM GOAL #4   Title pt to be I with all HEP given to maintain and progress current level of function    Baseline no previou HEP    Time 6    Period Weeks    Status New    Target Date 05/01/20      PT LONG TERM GOAL #5   Title -    Baseline -                 Plan -  04/05/20 1222    Clinical Impression Statement pt reports she has been consistent with her HEP and notes relief following for short period of time. She is waiting for updated referral to include her back to her POC. Educated and consent was given for TPDN focusing on the R upper trap followed with IASTM techniques. Trialed R upper trap inhibition taping to promote relief. she responded well with exercise for the shoulder. she noted feeling sore following session likely from DN.    PT Treatment/Interventions ADLs/Self Care Home Management;Electrical Stimulation;Iontophoresis 4mg /ml Dexamethasone;Dry needling;Moist Heat;Ultrasound;Therapeutic activities;Therapeutic exercise;Balance training;Neuromuscular re-education;Manual techniques;Passive range of motion;Patient/family education;Taping    PT Next Visit Plan review / update HEP, discuss DN for upper trap/ levator scpauale, posture enducation, posterior shoulder strengthening,    PT Home Exercise Plan [id number] - upper trap stretch,  levator scapuale stretch, chin tuck (supine), scapular retraction.    Consulted and Agree with Plan of Care Patient           Patient will benefit from skilled therapeutic intervention in order to improve the following deficits and impairments:  Obesity, Improper body mechanics, Increased muscle spasms, Decreased strength, Postural dysfunction, Pain, Decreased activity tolerance, Decreased endurance, Decreased range of motion  Visit Diagnosis: Cervicalgia  Abnormal posture     Problem List Patient Active Problem List   Diagnosis Date Noted  . Postpartum care following cesarean delivery 11/22/2019  . Contraceptive management 11/22/2019  . Positive RPR test 10/09/2019  . Migraine headache 07/12/2019  . Alpha thalassemia trait   . Vitamin D deficiency 09/16/2017  . Prediabetes 09/16/2017  . Depression 09/16/2017  . Class 3 severe obesity with serious comorbidity and body mass index (BMI) of 50.0 to 59.9 in  adult ([place]) 09/16/2017  . PCOS (polycystic ovarian syndrome) 08/13/2017  . Mass 05/06/2017  . Vertigo 10/06/2016  . Anemia 09/26/2016  . Pseudotumor cerebri syndrome 08/28/2016  . Obesity hypoventilation syndrome ([place]) 08/28/2016  . Benign paroxysmal positional vertigo due to bilateral vestibular disorder 08/28/2016  . OSA (obstructive sleep apnea) 08/28/2016  . Hypersomnia with sleep apnea 08/28/2016  . Female hirsutism 08/28/2016  . GERD (gastroesophageal reflux disease) 02/24/2015  . Constipation 10/02/2014   Starr Lake PT, DPT, LAT, ATC  04/05/20  12:34 PM      [facility] [address] [place], [place], [address] Phone: [phone]   Fax:  [phone]  Name: Maureen Lewis MRN: [id number] Date of Birth: August 03, 1996

## 2020-04-09 ENCOUNTER — Encounter (INDEPENDENT_AMBULATORY_CARE_PROVIDER_SITE_OTHER): Payer: Self-pay | Admitting: Family Medicine

## 2020-04-09 ENCOUNTER — Other Ambulatory Visit: Payer: Self-pay

## 2020-04-09 ENCOUNTER — Ambulatory Visit (INDEPENDENT_AMBULATORY_CARE_PROVIDER_SITE_OTHER): Payer: BC Managed Care – PPO | Admitting: Family Medicine

## 2020-04-09 VITALS — BP 115/85 | HR 89 | Temp 97.5°F | Ht 72.0 in | Wt >= 6400 oz

## 2020-04-09 DIAGNOSIS — R0602 Shortness of breath: Secondary | ICD-10-CM

## 2020-04-09 DIAGNOSIS — Z6841 Body Mass Index (BMI) 40.0 and over, adult: Secondary | ICD-10-CM

## 2020-04-09 DIAGNOSIS — R5383 Other fatigue: Secondary | ICD-10-CM

## 2020-04-09 DIAGNOSIS — Z1331 Encounter for screening for depression: Secondary | ICD-10-CM | POA: Diagnosis not present

## 2020-04-09 DIAGNOSIS — I10 Essential (primary) hypertension: Secondary | ICD-10-CM

## 2020-04-09 DIAGNOSIS — Z8669 Personal history of other diseases of the nervous system and sense organs: Secondary | ICD-10-CM | POA: Insufficient documentation

## 2020-04-09 DIAGNOSIS — G4733 Obstructive sleep apnea (adult) (pediatric): Secondary | ICD-10-CM

## 2020-04-09 DIAGNOSIS — E282 Polycystic ovarian syndrome: Secondary | ICD-10-CM | POA: Diagnosis not present

## 2020-04-09 DIAGNOSIS — Z0289 Encounter for other administrative examinations: Secondary | ICD-10-CM

## 2020-04-09 DIAGNOSIS — Z9189 Other specified personal risk factors, not elsewhere classified: Secondary | ICD-10-CM

## 2020-04-09 NOTE — Addendum Note (Signed)
Addended by: Larey Seat on: 04/09/2020 05:48 PM   Modules accepted: Orders

## 2020-04-09 NOTE — Procedures (Signed)
Patient Information     First Name: Maureen Last Name: Lewis ID: [id number]  Birth Date: [birth year] Age: 24 Gender: Female  Referring Provider: Eloise Levels, NP BMI: 55.5 (W=432 lb, H=6' 2'')  Neck Circ.: Address: [address]" [place]:   Sleep Study Information    Study Date: Mar 26, 2020 S/H/A Version: 001.001.001.001 / 4.1.1528 / 17  History:    [name] presents now for a worsening of headaches, of the same quality but of higher intensity than she had 3 years ago.  She had undergone a sleep study with [name] in October 2018 evaluating her risk factors of obesity, retro-orbital pressure headaches, dizzy spells, vertigo in correlation to her pseudotumor condition.  She tested positive for mild sleep apnea with an AHI of 8.4, the REM sleep was much more pronounced with an AHI of 39.6.  And supine AHI was 9.9 versus a non-supine sleep position AHI of 6/h.  There was no clinically significant hypoxemia or hypercapnia found and we recommended a CPAP titration at the time she returned for the CPAP titration in November 2018 a CPAP at 9 cm water pressure was prescribed heated humidity and 2 cm EPR and she used a nasal pillow.  In 2019 she had again passing- out spells, headaches and high blood pressure and she was noncompliant with CPAP.   We had made in January 12, 2018 appointment but this did not take place.  She does not have her CPAP anymore.       Summary & Diagnosis:      Moderate sleep apnea at AHI 20.6/h and strongly REM sleep dependent with an AHI in REM sleep of 57.6/h  with no clinically relevant hypoxemia time.   Recommendations:     REM dependent sleep apnea - This strongly REM dependent Sleep Apnea Type needs Positive Airway pressure to be alleviated.  I suggest a setting for auto CPAP between 6 and 16 cm water, 2 cm  EPR, heated humidity and mask of patient's choice and comfort.    Interpreting Physician: Larey Seat, MD              Start Study Time: End Study Time: Total  Recording Time:       10:45:00 PM      4:14:45 AM 5 h, 29 min  Total Sleep Time % REM of Sleep Time:  4 h, 1 min  27.2    Mean: 96 Minimum: 86 Maximum: 99  Mean of Desaturations Nadirs (%):   92  Oxygen Desaturation. %:  4-9 10-20 >20 Total  Events Number Total   35 92.1   3  0  7.9 0.0  38 100.0  Oxygen Saturation: <90 <=88  <85 <80 <70  Duration (minutes): Sleep % 0.6 0.2 0.4 0.2  0.0 0.0  0.0 0.0 0.0 0.0     Respiratory Indices       Total Events REM NREM All Night  pRDI: pAHI: ODI:  76  69  38 59.2 57.6 33.9 14.4 12.3 6.3 22.0 20.0 11.0       Pulse Rate Statistics during Sleep (BPM)      Mean: 81 Minimum: 62 Maximum: 107    Sleep Summary  Oxygen Saturation Statistics   Indices are calculated using technically valid sleep time of 3 s, 26 min. pRDI/pAHI are calculated using oxi desaturations ? 3%                 Sleep Stages Chart  pAHI=20.0                                           Mild              Moderate                    Severe                                                 5              15                    30

## 2020-04-10 LAB — COMPREHENSIVE METABOLIC PANEL
ALT: 19 IU/L (ref 0–32)
AST: 17 IU/L (ref 0–40)
Albumin/Globulin Ratio: 1.5 (ref 1.2–2.2)
Albumin: 4.8 g/dL (ref 3.9–5.0)
Alkaline Phosphatase: 124 IU/L — ABNORMAL HIGH (ref 48–121)
BUN/Creatinine Ratio: 18 (ref 9–23)
BUN: 11 mg/dL (ref 6–20)
Bilirubin Total: 0.4 mg/dL (ref 0.0–1.2)
CO2: 19 mmol/L — ABNORMAL LOW (ref 20–29)
Calcium: 9.9 mg/dL (ref 8.7–10.2)
Chloride: 104 mmol/L (ref 96–106)
Creatinine, Ser: 0.6 mg/dL (ref 0.57–1.00)
GFR calc Af Amer: 151 mL/min/{1.73_m2} (ref >59)
GFR calc non Af Amer: 131 mL/min/{1.73_m2} (ref >59)
Globulin, Total: 3.1 g/dL (ref 1.5–4.5)
Glucose: 96 mg/dL (ref 65–99)
Potassium: 4.8 mmol/L (ref 3.5–5.2)
Sodium: 140 mmol/L (ref 134–144)
Total Protein: 7.9 g/dL (ref 6.0–8.5)

## 2020-04-10 LAB — VITAMIN D 25 HYDROXY (VIT D DEFICIENCY, FRACTURES): Vit D, 25-Hydroxy: 12.1 ng/mL — ABNORMAL LOW (ref 30.0–100.0)

## 2020-04-10 LAB — T3: T3, Total: 160 ng/dL (ref 71–180)

## 2020-04-10 LAB — CBC WITH DIFFERENTIAL/PLATELET
Basophils Absolute: 0 10*3/uL (ref 0.0–0.2)
Basos: 0 %
EOS (ABSOLUTE): 0.1 10*3/uL (ref 0.0–0.4)
Eos: 1 %
Hematocrit: 39.8 % (ref 34.0–46.6)
Hemoglobin: 12.7 g/dL (ref 11.1–15.9)
Immature Grans (Abs): 0 10*3/uL (ref 0.0–0.1)
Immature Granulocytes: 0 %
Lymphocytes Absolute: 1.9 10*3/uL (ref 0.7–3.1)
Lymphs: 28 %
MCH: 21.6 pg — ABNORMAL LOW (ref 26.6–33.0)
MCHC: 31.9 g/dL (ref 31.5–35.7)
MCV: 68 fL — ABNORMAL LOW (ref 79–97)
Monocytes Absolute: 0.4 10*3/uL (ref 0.1–0.9)
Monocytes: 6 %
Neutrophils Absolute: 4.2 10*3/uL (ref 1.4–7.0)
Neutrophils: 65 %
Platelets: 318 10*3/uL (ref 150–450)
RBC: 5.88 x10E6/uL — ABNORMAL HIGH (ref 3.77–5.28)
RDW: 17.3 % — ABNORMAL HIGH (ref 11.7–15.4)
WBC: 6.6 10*3/uL (ref 3.4–10.8)

## 2020-04-10 LAB — LIPID PANEL WITH LDL/HDL RATIO
Cholesterol, Total: 156 mg/dL (ref 100–199)
HDL: 42 mg/dL (ref >39)
LDL Chol Calc (NIH): 101 mg/dL — ABNORMAL HIGH (ref 0–99)
LDL/HDL Ratio: 2.4 ratio (ref 0.0–3.2)
Triglycerides: 63 mg/dL (ref 0–149)
VLDL Cholesterol Cal: 13 mg/dL (ref 5–40)

## 2020-04-10 LAB — INSULIN, RANDOM: INSULIN: 36.8 u[IU]/mL — ABNORMAL HIGH (ref 2.6–24.9)

## 2020-04-10 LAB — HEMOGLOBIN A1C
Est. average glucose Bld gHb Est-mCnc: 126 mg/dL
Hgb A1c MFr Bld: 6 % — ABNORMAL HIGH (ref 4.8–5.6)

## 2020-04-10 LAB — VITAMIN B12: Vitamin B-12: 243 pg/mL (ref 232–1245)

## 2020-04-10 LAB — T4: T4, Total: 9 ug/dL (ref 4.5–12.0)

## 2020-04-10 LAB — FOLATE: Folate: 8.9 ng/mL (ref >3.0)

## 2020-04-10 LAB — TSH: TSH: 1.27 u[IU]/mL (ref 0.450–4.500)

## 2020-04-10 NOTE — Progress Notes (Signed)
Dear Dorothyann Peng, NP,   Thank you for referring Maureen Lewis to our clinic. The following note includes my evaluation and treatment recommendations.  Chief Complaint:   OBESITY Maureen Lewis (MR# [id number]) is a 24 y.o. female who presents for evaluation and treatment of obesity and related comorbidities. Current BMI is Body mass index is 59.27 kg/m.Maureen Lewis has been struggling with her weight for many years and has been unsuccessful in either losing weight, maintaining weight loss, or reaching her healthy weight goal.  Maureen Lewis is currently in the action stage of change and ready to dedicate time achieving and maintaining a healthier weight. Maureen Lewis is interested in becoming our patient and working on intensive lifestyle modifications including (but not limited to) diet and exercise for weight loss.  Maureen Lewis has a 1-month-old daughter. She states she always skips a meal daily - almost always breakfast and skips lunch 3 times per week. She drinks water, gatorade +/- juice. Lunch is fast food - [employer] chicken tenders (4 tenders) and fries - eats all and feels full. Dinner is 1 breast of chicken or 2 pieces of fish, 2 cups of veggies, and 2 cups of mashed potatoes or rice. For a snack after dinner she will eat a bag of chips or crackers - Doritos, Lays, or onion rings (1 individual bag).  Maureen Lewis's habits were reviewed today and are as follows: Her family sometimes eats meals together, she thinks her family will eat healthier with her, her desired weight loss is 117 lbs, she has been heavy most of her life, she craves junk foods, she snacks frequently in the evenings, she skips meals frequently, she frequently makes poor food choices, she has problems with excessive hunger, she frequently eats larger portions than normal, she has binge eating behaviors and she struggles with emotional eating.  Depression Screen Maureen Lewis's Food and Mood (modified PHQ-9) score was 11.  Depression screen PHQ 2/9 04/09/2020    Decreased Interest 1  Down, Depressed, Hopeless 2  PHQ - 2 Score 3  Altered sleeping 2  Tired, decreased energy 3  Change in appetite 2  Feeling bad or failure about yourself  0  Trouble concentrating 0  Moving slowly or fidgety/restless 1  Suicidal thoughts 0  PHQ-9 Score 11  Difficult doing work/chores Somewhat difficult  Some recent data might be hidden   Subjective:   Other fatigue. Maureen Lewis admits to daytime somnolence and admits to waking up still tired. Patent has a history of symptoms of daytime fatigue, morning fatigue, Epworth sleepiness scale and morning headache. Maureen Lewis generally gets 4-6 hours of sleep per night, and states that she does not sleep well most nights. Snoring is present. Apneic episodes are present. Epworth Sleepiness Score is 23.  SOB (shortness of breath) on exertion. [name] notes increasing shortness of breath with exercising and seems to be worsening over time with weight gain. She notes getting out of breath sooner with activity than she used to. This has gotten worse recently. Maureen Lewis denies shortness of breath at rest or orthopnea.  PCOS (polycystic ovarian syndrome). PCOS was diagnosed many years ago. Maureen Lewis is not on metformin currently, but was on it previously.  Essential hypertension. Blood pressure is controlled today. Hypertension was diagnosed after delivery of her child. No chest pain, chest pressure, or headache.   BP Readings from Last 3 Encounters:  04/09/20 115/85  03/20/20 134/80  03/13/20 138/82   Lab Results  Component Value Date   CREATININE 0.59 02/21/2020   CREATININE 0.65 02/16/2020  CREATININE 0.55 10/09/2019   OSA (obstructive sleep apnea). Maureen Lewis sees Dr. Brett Fairy and is getting worked up with a sleep study.  Depression screening. Maureen Lewis had a moderately positive depression screen with a PHQ-9 score of 11.  At risk for diabetes mellitus. Maureen Lewis is at higher than average risk for developing diabetes due to her obesity.    Assessment/Plan:   Other fatigue. Maureen Lewis does feel that her weight is causing her energy to be lower than it should be. Fatigue may be related to obesity, depression or many other causes. Labs will be ordered, and in the meanwhile, Edra will focus on self care including making healthy food choices, increasing physical activity and focusing on stress reduction. EKG 12-Lead, CBC with Differential/Platelet, Lipid Panel With LDL/HDL Ratio, VITAMIN D 25 Hydroxy (Vit-D Deficiency, Fractures), Vitamin B12, Folate, T3, T4, TSH testing ordered today.  SOB (shortness of breath) on exertion. Maureen Lewis does feel that she gets out of breath more easily that she used to when she exercises. Maureen Lewis's shortness of breath appears to be obesity related and exercise induced. She has agreed to work on weight loss and gradually increase exercise to treat her exercise induced shortness of breath. Will continue to monitor closely. CBC with Differential/Platelet, Lipid Panel With LDL/HDL Ratio, VITAMIN D 25 Hydroxy (Vit-D Deficiency, Fractures), Vitamin B12, Folate, T3, T4, TSH labs ordered today.  PCOS (polycystic ovarian syndrome). Hemoglobin A1c, Insulin, random ordered today.  Essential hypertension. Maureen Lewis is working on healthy weight loss and exercise to improve blood pressure control. We will watch for signs of hypotension as she continues her lifestyle modifications. Comprehensive metabolic panel, Lipid Panel With LDL/HDL Ratio ordered today.  OSA (obstructive sleep apnea). Intensive lifestyle modifications are the first line treatment for this issue. We discussed several lifestyle modifications today and she will continue to work on diet, exercise and weight loss efforts. We will continue to monitor. Orders and follow up as documented in patient record. Maureen Lewis will follow-up with Dr. Brett Fairy as scheduled.  Counseling  Sleep apnea is a condition in which breathing pauses or becomes shallow during sleep. This happens over  and over during the night. This disrupts your sleep and keeps your body from getting the rest that it needs, which can cause tiredness and lack of energy (fatigue) during the day.  Sleep apnea treatment: If you were given a device to open your airway while you sleep, USE IT!  Sleep hygiene:   Limit or avoid alcohol, caffeinated beverages, and cigarettes, especially close to bedtime.   Do not eat a large meal or eat spicy foods right before bedtime. This can lead to digestive discomfort that can make it hard for you to sleep.  Keep a sleep diary to help you and your health care provider figure out what could be causing your insomnia.  . Make your bedroom a dark, comfortable place where it is easy to fall asleep. ? Put up shades or blackout curtains to block light from outside. ? Use a white noise machine to block noise. ? Keep the temperature cool. . Limit screen use before bedtime. This includes: ? Watching TV. ? Using your smartphone, tablet, or computer. . Stick to a routine that includes going to bed and waking up at the same times every day and night. This can help you fall asleep faster. Consider making a quiet activity, such as reading, part of your nighttime routine. . Try to avoid taking naps during the day so that you sleep better at night. . Get  out of bed if you are still awake after 15 minutes of trying to sleep. Keep the lights down, but try reading or doing a quiet activity. When you feel sleepy, go back to bed.  Depression screening. Hiromi had a positive depression screening. Depression is commonly associated with obesity and often results in emotional eating behaviors. We will monitor this closely and work on CBT to help improve the non-hunger eating patterns. Referral to Psychology may be required if no improvement is seen as she continues in our clinic.  At risk for diabetes mellitus. Annisa was given approximately 30 minutes of diabetes education and counseling today. We  discussed intensive lifestyle modifications today with an emphasis on weight loss as well as increasing exercise and decreasing simple carbohydrates in her diet. We also reviewed medication options with an emphasis on risk versus benefit of those discussed.   Repetitive spaced learning was employed today to elicit superior memory formation and behavioral change.  Class 3 severe obesity with serious comorbidity and body mass index (BMI) of 50.0 to 59.9 in adult, unspecified obesity type ([place]).  Gitel is currently in the action stage of change and her goal is to continue with weight loss efforts. I recommend Ronnett begin the structured treatment plan as follows:  She has agreed to the Category 2 Plan + 100 calories and 8 oz meat.  Exercise goals: No exercise has been prescribed at this time.   Behavioral modification strategies: increasing lean protein intake, increasing vegetables, meal planning and cooking strategies, keeping healthy foods in the home and planning for success.  She was informed of the importance of frequent follow-up visits to maximize her success with intensive lifestyle modifications for her multiple health conditions. She was informed we would discuss her lab results at her next visit unless there is a critical issue that needs to be addressed sooner. Pearline agreed to keep her next visit at the agreed upon time to discuss these results.  Objective:   Blood pressure 115/85, pulse 89, temperature (!) 97.5 F (36.4 C), temperature source Oral, height 6' (1.829 m), weight (!) 437 lb (198.2 kg), SpO2 99 %, not currently breastfeeding. Body mass index is 59.27 kg/m.  EKG: Sinus  Rhythm with a rate of 80 BPM. Lateral ST-elevation -repolarization variant. Nonspecific T-abnormality. Abnormal.  Indirect Calorimeter completed today shows a VO2 of 340 and a REE of 2368.  Her calculated basal metabolic rate is 6295 thus her basal metabolic rate is worse than expected.  General:  Cooperative, alert, well developed, in no acute distress. HEENT: Conjunctivae and lids unremarkable. Cardiovascular: Regular rhythm.  Lungs: Normal work of breathing. Neurologic: No focal deficits.  Skin: Positive for acanthosis nigricans.  Lab Results  Component Value Date   CREATININE 0.59 02/21/2020   BUN 11 02/21/2020   NA 137 02/21/2020   K 4.3 02/21/2020   CL 105 02/21/2020   CO2 27 02/21/2020   Lab Results  Component Value Date   ALT 12 02/21/2020   AST 13 02/21/2020   ALKPHOS 95 02/21/2020   BILITOT 0.4 02/21/2020   Lab Results  Component Value Date   HGBA1C 5.8 (H) 04/06/2019   HGBA1C 6.0 04/27/2018   HGBA1C 5.8 (H) 08/13/2017   HGBA1C 5.9 03/08/2014   HGBA1C 5.8 05/20/2012   Lab Results  Component Value Date   INSULIN 42.1 (H) 08/13/2017   Lab Results  Component Value Date   TSH 0.95 04/27/2018   Lab Results  Component Value Date   CHOL 117 04/27/2018  HDL 36.40 (L) 04/27/2018   LDLCALC 72 04/27/2018   TRIG 45.0 04/27/2018   CHOLHDL 3 04/27/2018   Lab Results  Component Value Date   WBC 6.5 02/21/2020   HGB 11.7 (L) 02/21/2020   HCT 37.3 02/21/2020   MCV 67.6 Repeated and verified X2. (L) 02/21/2020   PLT 287.0 02/21/2020   Lab Results  Component Value Date   FERRITIN 76 04/06/2019   Attestation Statements:   Reviewed by clinician on day of visit: allergies, medications, problem list, medical history, surgical history, family history, social history, and previous encounter notes.  I, Michaelene Song, am acting as transcriptionist for Coralie Common, MD   I have reviewed the above documentation for accuracy and completeness, and I agree with the above. - Jinny Blossom, MD

## 2020-04-11 ENCOUNTER — Encounter: Payer: Self-pay | Admitting: Obstetrics

## 2020-04-11 ENCOUNTER — Encounter: Payer: Self-pay | Admitting: Neurology

## 2020-04-11 ENCOUNTER — Other Ambulatory Visit: Payer: Self-pay

## 2020-04-11 ENCOUNTER — Ambulatory Visit (INDEPENDENT_AMBULATORY_CARE_PROVIDER_SITE_OTHER): Payer: BC Managed Care – PPO | Admitting: Obstetrics

## 2020-04-11 ENCOUNTER — Telehealth: Payer: Self-pay | Admitting: Neurology

## 2020-04-11 ENCOUNTER — Other Ambulatory Visit (HOSPITAL_COMMUNITY)
Admission: RE | Admit: 2020-04-11 | Discharge: 2020-04-11 | Disposition: A | Discharge status: Home / Self Care | Payer: BC Managed Care – PPO | Source: Ambulatory Visit | Attending: Obstetrics | Admitting: Obstetrics

## 2020-04-11 VITALS — BP 144/67 | HR 80 | Ht 74.0 in | Wt >= 6400 oz

## 2020-04-11 DIAGNOSIS — Z113 Encounter for screening for infections with a predominantly sexual mode of transmission: Secondary | ICD-10-CM | POA: Diagnosis not present

## 2020-04-11 DIAGNOSIS — R102 Pelvic and perineal pain: Secondary | ICD-10-CM

## 2020-04-11 DIAGNOSIS — N898 Other specified noninflammatory disorders of vagina: Secondary | ICD-10-CM | POA: Diagnosis present

## 2020-04-11 DIAGNOSIS — N946 Dysmenorrhea, unspecified: Secondary | ICD-10-CM | POA: Diagnosis not present

## 2020-04-11 DIAGNOSIS — Z6841 Body Mass Index (BMI) 40.0 and over, adult: Secondary | ICD-10-CM

## 2020-04-11 DIAGNOSIS — Z01419 Encounter for gynecological examination (general) (routine) without abnormal findings: Secondary | ICD-10-CM

## 2020-04-11 MED ORDER — IBUPROFEN 800 MG PO TABS: 800.0000 mg | ORAL_TABLET | Freq: Three times a day (TID) | ORAL | 5 refills | Status: DC | PRN

## 2020-04-11 NOTE — Telephone Encounter (Signed)
Called patient to discuss sleep study results. No answer at this time. LVM for the patient to call back.  Will send a mychart message also.

## 2020-04-11 NOTE — Progress Notes (Signed)
Subjective:        Maureen Lewis is a 24 y.o. female here for a routine exam.  Current complaints: Cramping.    Personal health questionnaire:  Is patient Maureen Lewis, have a family history of breast and/or ovarian cancer: no Is there a family history of uterine cancer diagnosed at age < 40, gastrointestinal cancer, urinary tract cancer, family member who is a Field seismologist syndrome-associated carrier: no Is the patient overweight and hypertensive, family history of diabetes, personal history of gestational diabetes, preeclampsia or PCOS: yes Is patient over 42, have PCOS,  family history of premature CHD under age 72, diabetes, smoke, have hypertension or peripheral artery disease:  no At any time, has a partner hit, kicked or otherwise hurt or frightened you?: no Over the past 2 weeks, have you felt down, depressed or hopeless?: no Over the past 2 weeks, have you felt little interest or pleasure in doing things?:no   Gynecologic History No LMP recorded. Patient has had an injection. Contraception: Depo-Provera injections Last Pap: n/a. Results were: n/a Last mammogram: n/a. Results were: n/a  Obstetric History OB History  Gravida Para Term Preterm AB Living  '1 1 1 '$ 0 0 1  SAB TAB Ectopic Multiple Live Births  0 0 0 0 1    # Outcome Date GA Lbr Len/2nd Weight Sex Delivery Anes PTL Lv  1 Term 10/09/19 [redacted]w[redacted]d 7 lb 6.9 oz (3.37 kg) F CS-Vac EPI  LIV    Past Medical History:  Diagnosis Date  . Abnormal uterine bleeding   . Acid reflux   . Alpha thalassemia trait   . Amenorrhea   . Anemia    no current med.  . Back pain   . Cesarean delivery delivered 10/11/2019   10/09/2019 - primary CS for failed IOL  . Chest pain   . Complication of anesthesia    states had to keep giving her anesthesia during EGD  . Constipation   . Depression    "I'm good"  . Finger mass, left 03/2017   middle finger  . Gestational diabetes   . Gestational diabetes 05/23/2019   Current Diabetic  Medications:  None  '[ ]'$  Aspirin 81 mg daily after 12 weeks (? A2/B GDM)  Required Referrals for A1GDM or A2GDM: '[ ]'$  Diabetes Education and Testing Supplies '[ ]'$  Nutrition Cousult  For A2/B GDM or higher classes of DM '[ ]'$  Diabetes Education and Testing Supplies '[ ]'$  Nutrition Counsult '[ ]'$  Fetal ECHO after 20 weeks  '[ ]'$  Eye exam for retina evaluation   Baseline and surveillance labs (  . Gestational hypertension 09/23/2019   Guidelines for Antenatal Testing and Sonography  (with updated ICD-10 codes)  Updated  111/21/20with Dr. RTama High INDICATION U/S 2 X week NST/AFI  or full BPP wkly DELIVERY Diabetes   A1 - good control - O24.410    A2 - good control - O24.419      A2  - poor control or poor compliance - O24.419, E11.65   (Macrosomia or polyhydramnios) **E11.65 is extra code for poor control**    A2/B - O24.  .Marland KitchenHeadache   . Hypertension   . Infection    UTI  . Irritable bowel syndrome (IBS)   . Joint pain   . Morbid obesity with body mass index (BMI) of 45.0 to 49.9 in adult ([facility]   . Obesity during pregnancy, antepartum 04/06/2019   Body mass index is 53.71 kg/m.  Recommendations '[x]'$  Aspirin 81  mg daily after 12 weeks; discontinue after 36 weeks '[ ]'$  Nutrition consult '[ ]'$  Weight gain 11-20 lbs for singleton and 25-35 lbs for twin pregnancy (IOM guidelines) Higher class of obesity patients recommended to gain closer to lower limit  Weight loss is associated with adverse outcomes '[ ]'$  Baseline and surveillance labs (pulled in fr  . Plantar fasciitis, bilateral   . Polycystic ovary syndrome   . Prediabetes    "prediabetes"  . Pregnancy induced hypertension   . Shortness of breath   . Sleep apnea    no CPAP use  . Supervision of normal first pregnancy 04/06/2019   [name]: [ x]Initial [ x]12 '[ ]'$ 20 '[ ]'$ 28 '[ ]'$ 32 '[ ]'$ 36 '[ ]'$ 38 '[ ]'$ 39 '[ ]'$ 40 Nursing Staff Provider Office Location [facility]  Dating   LMP Language  English  Anatomy US  Nml Flu Vaccine  Declined 07/28/19 Genetic Screen  NIPS: low  risks   AFP:   negative  TDaP vaccine   08/25/19 Hgb A1C or  GTT Early A1C 5.8 Third trimester: GDM insulin Rhogam  NA   LAB RESULTS  Feeding Plan Breast Blood Type  . Vertigo 2017    Past Surgical History:  Procedure Laterality Date  . CESAREAN SECTION N/A 10/09/2019   Procedure: CESAREAN SECTION;  Surgeon: Aletha Halim, MD;  Location: [facility];  Service: Obstetrics;  Laterality: N/A;  . COLONOSCOPY WITH PROPOFOL  10/07/2016  . ESOPHAGOGASTRODUODENOSCOPY  12/21/2015  . EXCISION MASS UPPER EXTREMETIES Left 04/21/2017   Procedure: EXCISION MASS LEFT MIDDLE FINGER;  Surgeon: Daryll Brod, MD;  Location: [place];  Service: Orthopedics;  Laterality: Left;  . IR FL GUIDED LOC OF NEEDLE/CATH TIP FOR SPINAL INJECTION RT  03/13/2020     Current Outpatient Medications:  .  amLODipine (NORVASC) 10 MG tablet, Take 1 tablet (10 mg total) by mouth daily., Disp: 30 tablet, Rfl: 2 .  baclofen (LIORESAL) 10 MG tablet, Take 1 tablet (10 mg total) by mouth 2 (two) times daily., Disp: 30 each, Rfl: 0 .  Blood Pressure Monitoring KIT, 1 kit by Does not apply route once a week., Disp: 1 kit, Rfl: 0 .  ferrous sulfate 325 (65 FE) MG tablet, Take 1 tablet (325 mg total) by mouth every other day., Disp: 45 tablet, Rfl: 1 .  hydrochlorothiazide (HYDRODIURIL) 25 MG tablet, Take 1 tablet (25 mg total) by mouth daily., Disp: 30 tablet, Rfl: 2 .  loratadine (CLARITIN) 10 MG tablet, Take 10 mg by mouth daily as needed for allergies. , Disp: , Rfl:  .  medroxyPROGESTERone (DEPO-PROVERA) 150 MG/ML injection, Inject 1 mL (150 mg total) into the muscle every 3 (three) months., Disp: 1 mL, Rfl: 3 .  ibuprofen (ADVIL) 800 MG tablet, Take 1 tablet (800 mg total) by mouth every 8 (eight) hours as needed., Disp: 30 tablet, Rfl: 5 .  ondansetron (ZOFRAN-ODT) 4 MG disintegrating tablet, Take 4 mg by mouth every 8 (eight) hours as needed. (Patient not taking: Reported on 04/11/2020), Disp: , Rfl:  .  polyethylene  glycol powder (GLYCOLAX/MIRALAX) 17 GM/SCOOP powder, , Disp: , Rfl:  .  topiramate (TOPAMAX) 25 MG tablet, Take 2 tablets (50 mg total) by mouth 2 (two) times daily. (Patient not taking: Reported on 04/11/2020), Disp: 180 tablet, Rfl: 1 Allergies  Allergen Reactions  . Bee Pollen Hives, Shortness Of Breath and Swelling  . Bee Venom Hives, Shortness Of Breath and Swelling  . Hydrocodone-Acetaminophen Anaphylaxis    No problem when she  takes Tylenol  . Peach Flavor Hives, Shortness Of Breath and Other (See Comments)    SWELLING OF MOUTH  . Pollen Extract Hives, Shortness Of Breath and Swelling    Social History   Tobacco Use  . Smoking status: Never Smoker  . Smokeless tobacco: Never Used  Substance Use Topics  . Alcohol use: Not Currently    Comment: social     Family History  Problem Relation Age of Onset  . Diabetes Maternal Aunt   . Hypertension Maternal Uncle   . Depression Maternal Grandfather   . Diabetes Maternal Grandfather   . Hypertension Maternal Grandfather   . Arthritis Mother   . High blood pressure Mother   . Depression Mother   . Sleep apnea Mother   . Obesity Mother   . Diabetes Mother   . Hypertension Mother   . Sleep apnea Father   . Obesity Father       Review of Systems  Constitutional: negative for fatigue and weight loss Respiratory: negative for cough and wheezing Cardiovascular: negative for chest pain, fatigue and palpitations Gastrointestinal: positive for abdominal pain, and negative for change in bowel habits Musculoskeletal:negative for myalgias Neurological: negative for gait problems and tremors Behavioral/Psych: negative for abusive relationship, depression Endocrine: negative for temperature intolerance    Genitourinary:negative for abnormal menstrual periods, genital lesions, hot flashes, sexual problems and vaginal discharge.  Positive for cramping Integument/breast: negative for breast lump, breast tenderness, nipple discharge and  skin lesion(s)    Objective:       BP (!) 144/67   Pulse 80   Ht '6\' 2"'$  (1.88 m)   Wt (!) 440 lb 12.8 oz (199.9 kg)   BMI 56.60 kg/m  General:   alert  Skin:   no rash or abnormalities  Lungs:   clear to auscultation bilaterally  Heart:   regular rate and rhythm, S1, S2 normal, no murmur, click, rub or gallop  Breasts:   normal without suspicious masses, skin or nipple changes or axillary nodes  Abdomen:  normal findings: no organomegaly, soft, non-tender and no hernia  Pelvis:  External genitalia: normal general appearance Urinary system: urethral meatus normal and bladder without fullness, nontender Vaginal: normal without tenderness, induration or masses Cervix: normal appearance Adnexa: normal bimanual exam Uterus: anteverted and non-tender, normal size   Lab Review Urine pregnancy test Labs reviewed yes Radiologic studies reviewed no  50% of 20 min visit spent on counseling and coordination of care.   Assessment:     1. Encounter for routine gynecological examination with Papanicolaou smear of cervix Rx: - Cytology - PAP( [place])  2. Vaginal discharge Rx: - Cervicovaginal ancillary only( [place])  3. Screen for STD (sexually transmitted disease) Rx: - HIV antibody (with reflex) - RPR - Hepatitis B Surface AntiGEN - Hepatitis C Antibody  4. Pelvic pain Rx: - US PELVIC COMPLETE WITH TRANSVAGINAL; Future  5. Dysmenorrhea Rx: - ibuprofen (ADVIL) 800 MG tablet; Take 1 tablet (800 mg total) by mouth every 8 (eight) hours as needed.  Dispense: 30 tablet; Refill: 5  6. Class 3 severe obesity due to excess calories without serious comorbidity with body mass index (BMI) of 45.0 to 49.9 in adult [facility]) - program of caloric reduction, exercise and behavioral modification recommended    Plan:    Education reviewed: calcium supplements, depression evaluation, low fat, low cholesterol diet, safe sex/STD prevention, self breast exams and weight bearing  exercise. Contraception: Depo-Provera injections. Follow up in: 1 year.  Meds ordered this encounter  Medications  . ibuprofen (ADVIL) 800 MG tablet    Sig: Take 1 tablet (800 mg total) by mouth every 8 (eight) hours as needed.    Dispense:  30 tablet    Refill:  5   Orders Placed This Encounter  Procedures  . US PELVIC COMPLETE WITH TRANSVAGINAL    Standing Status:   Future    Standing Expiration Date:   04/11/2021    Order Specific Question:   Reason for Exam (SYMPTOM  OR DIAGNOSIS REQUIRED)    Answer:   Pelvic pain    Order Specific Question:   Preferred imaging location?    Answer:   [facility] Ul[facility]  . HIV antibody (with reflex)  . RPR  . Hepatitis B Surface AntiGEN  . Hepatitis C Antibody    Shelly Bombard, MD 04/11/2020 3:02 PM

## 2020-04-11 NOTE — Progress Notes (Signed)
Pt is in the office for cramping and spotting. Pt is on depo, last injection 02-14-20, next due July 13-27.

## 2020-04-11 NOTE — Telephone Encounter (Signed)
-----   Message from Larey Seat, MD sent at 04/09/2020  5:48 PM EDT ----- Moderate sleep apnea at AHI 20.6/h and strongly REM sleep dependent with an AHI in REM sleep of 57.6/h  with no clinically relevant hypoxemia time.   Recommendations:    REM dependent sleep apnea -This strongly REM dependent Sleep Apnea Type needs Positive Airway pressure to be alleviated.  I suggest a setting for auto CPAP between 6 and 16 cm water, 2 cm EPR, heated humidity and mask of patient's choice and comfort.    Interpreting Physician: Larey Seat, MD

## 2020-04-11 NOTE — Telephone Encounter (Signed)
I called pt. I advised pt that Dr. Brett Fairy reviewed their sleep study results and found that pt has moderate to severe sleep apnea. Dr. Brett Fairy recommends that pt starts auto CPAP. I reviewed PAP compliance expectations with the pt. Pt is agreeable to starting a CPAP. I advised pt that an order will be sent to a DME, [facility], and [facility] will call the pt within about one week after they file with the pt's insurance. [facility] will show the pt how to use the machine, fit for masks, and troubleshoot the CPAP if needed. A follow up appt was made for insurance purposes with Ward Givens, NP on Sept 9,2021 at 11:30 am . Pt verbalized understanding to arrive 15 minutes early and bring their CPAP. A letter with all of this information in it will be mailed to the pt as a reminder. I verified with the pt that the address we have on file is correct. Pt verbalized understanding of results. Pt had no questions at this time but was encouraged to call back if questions arise. I have sent the order to [facility] and have received confirmation that they have received the order.

## 2020-04-12 ENCOUNTER — Ambulatory Visit: Payer: BC Managed Care – PPO | Admitting: Physical Therapy

## 2020-04-12 ENCOUNTER — Encounter: Payer: Self-pay | Admitting: Physical Therapy

## 2020-04-12 DIAGNOSIS — G8929 Other chronic pain: Secondary | ICD-10-CM

## 2020-04-12 DIAGNOSIS — R293 Abnormal posture: Secondary | ICD-10-CM

## 2020-04-12 DIAGNOSIS — M545 Low back pain, unspecified: Secondary | ICD-10-CM

## 2020-04-12 DIAGNOSIS — M542 Cervicalgia: Secondary | ICD-10-CM | POA: Diagnosis not present

## 2020-04-12 LAB — CERVICOVAGINAL ANCILLARY ONLY
Bacterial Vaginitis (gardnerella): NEGATIVE
Candida Glabrata: NEGATIVE
Candida Vaginitis: NEGATIVE
Chlamydia: NEGATIVE
Comment: NEGATIVE
Comment: NEGATIVE
Comment: NEGATIVE
Comment: NEGATIVE
Comment: NEGATIVE
Comment: NORMAL
Neisseria Gonorrhea: NEGATIVE
Trichomonas: NEGATIVE

## 2020-04-12 LAB — CYTOLOGY - PAP
Comment: NEGATIVE
Diagnosis: NEGATIVE
High risk HPV: NEGATIVE

## 2020-04-12 LAB — HEPATITIS B SURFACE ANTIGEN: Hepatitis B Surface Ag: NEGATIVE

## 2020-04-12 LAB — RPR: RPR Ser Ql: NONREACTIVE

## 2020-04-12 LAB — HEPATITIS C ANTIBODY: Hep C Virus Ab: <0.1 s/co ratio (ref 0.0–0.9)

## 2020-04-12 LAB — HIV ANTIBODY (ROUTINE TESTING W REFLEX): HIV Screen 4th Generation wRfx: NONREACTIVE

## 2020-04-12 NOTE — Therapy (Signed)
[place], [place], [address] Phone: [phone]   Fax:  [phone]  Physical Therapy Treatment / re-evaluation  Patient Details  Name: Maureen Lewis MRN: [id number] Date of Birth: Jan 20, 1996 Referring Provider (PT): Nafziger, Haynes Dage   Encounter Date: 04/12/2020   PT End of Session - 04/12/20 1213    Visit Number 3    Number of Visits 13    Date for PT Re-Evaluation 05/24/20    Authorization Type [employer], MCD secondary submittedon 6/1    Authorization Time Period 04/05/2020 - 04/25/2020    Authorization - Visit Number 2    Authorization - Number of Visits 3    PT Start Time 3785    PT Stop Time 1225    PT Time Calculation (min) 40 min    Activity Tolerance Patient tolerated treatment well    Behavior During Therapy [facility] for tasks assessed/performed           Past Medical History:  Diagnosis Date  . Abnormal uterine bleeding   . Acid reflux   . Alpha thalassemia trait   . Amenorrhea   . Anemia    no current med.  . Back pain   . Cesarean delivery delivered 10/11/2019   10/09/2019 - primary CS for failed IOL  . Chest pain   . Complication of anesthesia    states had to keep giving her anesthesia during EGD  . Constipation   . Depression    "I'm good"  . Finger mass, left 03/2017   middle finger  . Gestational diabetes   . Gestational diabetes 05/23/2019   Current Diabetic Medications:  None  [ ]  Aspirin 81 mg daily after 12 weeks (? A2/B GDM)  Required Referrals for A1GDM or A2GDM: [ ]  Diabetes Education and Testing Supplies [ ]  Nutrition Cousult  For A2/B GDM or higher classes of DM [ ]  Diabetes Education and Testing Supplies [ ]  Nutrition Counsult [ ]  Fetal ECHO after 20 weeks  [ ]  Eye exam for retina evaluation   Baseline and surveillance labs (  . Gestational hypertension 09/23/2019   Guidelines for Antenatal Testing and Sonography  (with updated ICD-10 codes)  Updated  09-29-2019 with Dr. Tama High  INDICATION U/S 2 X week NST/AFI  or full BPP wkly DELIVERY Diabetes   A1 - good control - O24.410    A2 - good control - O24.419      A2  - poor control or poor compliance - O24.419, E11.65   (Macrosomia or polyhydramnios) **E11.65 is extra code for poor control**    A2/B - O24.  Marland Kitchen Headache   . Hypertension   . Infection    UTI  . Irritable bowel syndrome (IBS)   . Joint pain   . Morbid obesity with body mass index (BMI) of 45.0 to 49.9 in adult [facility])   . Obesity during pregnancy, antepartum 04/06/2019   Body mass index is 53.71 kg/m.  Recommendations [x]  Aspirin 81 mg daily after 12 weeks; discontinue after 36 weeks [ ]  Nutrition consult [ ]  Weight gain 11-20 lbs for singleton and 25-35 lbs for twin pregnancy (IOM guidelines) Higher class of obesity patients recommended to gain closer to lower limit  Weight loss is associated with adverse outcomes [ ]  Baseline and surveillance labs (pulled in fr  . Plantar fasciitis, bilateral   . Polycystic ovary syndrome   . Prediabetes    "prediabetes"  . Pregnancy induced hypertension   . Shortness  of breath   . Sleep apnea    no CPAP use  . Supervision of normal first pregnancy 04/06/2019   [name]: [ x]Initial [ x]12 [ ] 20 [ ] 28 [ ] 32 [ ] 36 [ ] 38 [ ] 39 [ ] 40 Nursing Staff Provider Office Location [facility]  Dating   LMP Language  English  Anatomy US  Nml Flu Vaccine  Declined 07/28/19 Genetic Screen  NIPS: low risks   AFP:   negative  TDaP vaccine   08/25/19 Hgb A1C or  GTT Early A1C 5.8 Third trimester: GDM insulin Rhogam  NA   LAB RESULTS  Feeding Plan Breast Blood Type  . Vertigo 2017    Past Surgical History:  Procedure Laterality Date  . CESAREAN SECTION N/A 10/09/2019   Procedure: CESAREAN SECTION;  Surgeon: Aletha Halim, MD;  Location: [facility];  Service: Obstetrics;  Laterality: N/A;  . COLONOSCOPY WITH PROPOFOL  10/07/2016  . ESOPHAGOGASTRODUODENOSCOPY  12/21/2015  . EXCISION MASS UPPER EXTREMETIES Left 04/21/2017    Procedure: EXCISION MASS LEFT MIDDLE FINGER;  Surgeon: Daryll Brod, MD;  Location: [place];  Service: Orthopedics;  Laterality: Left;  . IR FL GUIDED LOC OF NEEDLE/CATH TIP FOR SPINAL INJECTION RT  03/13/2020    There were no vitals filed for this visit.   Subjective Assessment - 04/12/20 1144    Subjective "pt reports the neck and shoulder is doing better pain is a 5/10. I really think the DN helped alot. pt received updated referral for the back that started in December of 2020 after she gave birth to her childs. Since onset the pain seems to stay the same, but can flcutuate in severity. She reports some intermittent  N/T down both legs into both feet. pt denies any red flags. she rpors having a hx or low back pain but notes it wasn't as severe as it is now.    Diagnostic tests 02/21/2020 x-ray IMPRESSION:Negative cervical spine radiographs. head CT scan 03/09/2020 IMPRESSION:  Normal CT scan of the head without contrast    Patient Stated Goals to get better, reduce pain,    Currently in Pain? Yes    Pain Score 5     Pain Location Neck    Pain Orientation Right;Left    Pain Descriptors / Indicators Aching;Sore;Tightness    Pain Type Chronic pain    Pain Onset More than a month ago    Pain Frequency Intermittent    Aggravating Factors  unspecific activity    Pain Relieving Factors muscle relaxer, ,heating pad ice.    Multiple Pain Sites Yes    Pain Score 8   at worst 10/10   Pain Location Back    Pain Orientation Right;Left;Lower    Pain Descriptors / Indicators Aching;Tightness;Spasm;Sharp    Pain Type Chronic pain    Pain Radiating Towards intermittent N/T down bil LE into both ft    Pain Onset More than a month ago    Pain Frequency Intermittent    Aggravating Factors  walking/ standing, lifting, reaching any activity    Pain Relieving Factors ice, heat, medcation    Effect of Pain on Daily Activities limited standing/ walking              [facility] PT Assessment -  04/12/20 0001      Assessment   Medical Diagnosis Chest pain, unspecified type R07.9, Cervical radiculopathy M54.12    Referring Provider (PT) Nafziger, Haynes Dage      AROM   AROM Assessment  Site Lumbar    Lumbar Flexion 110    Lumbar Extension 32   pain during motion   Lumbar - Right Side Bend 30   ERP   Lumbar - Left Side Bend 20   pain during motion     Strength   Strength Assessment Site Hip;Knee    Right/Left Hip Right;Left    Right Hip Flexion 5/5    Right Hip Extension 5/5    Right Hip ABduction 5/5    Right Hip ADduction 5/5    Left Hip Flexion 5/5    Left Hip ABduction 5/5    Left Hip ADduction 5/5    Right/Left Knee Right;Left    Right Knee Flexion 5/5    Right Knee Extension 5/5    Left Knee Flexion 5/5    Left Knee Extension 5/5      Palpation   Palpation comment TTP along bil lumbar paraspinals L>R                         [facility] Adult PT Treatment/Exercise - 04/12/20 0001      Therapeutic Activites    Therapeutic Activities Other Therapeutic Activities    Other Therapeutic Activities log rolling going from supine<>sit with demonstration and cues for proper form      Neck Exercises: Theraband   Other Theraband Exercises scapular retraction bil with ER using red theraband      Neck Exercises: Seated   Neck Retraction 10 reps;5 secs      Lumbar Exercises: Stretches   Other Lumbar Stretch Exercise low back stretch 2 x 30 seconds walking hands out on to phsyioball      Lumbar Exercises: Supine   Bent Knee Raise 10 reps   x 2 sets   Bent Knee Raise Limitations cues to keep core tight      Manual Therapy   Manual therapy comments MTPR along bil lumbar paraspinals x 3 ea.      Neck Exercises: Stretches   Upper Trapezius Stretch Right;Left;1 rep;30 seconds    Levator Stretch Right;1 rep;Left;30 seconds                  PT Education - 04/12/20 1238    Education Details reviewed previous HEP and updated today to include for the low  back. anatomy of the area's involved, Goals    Person(s) Educated Patient    Methods Explanation;Verbal cues;Handout    Comprehension Verbalized understanding;Verbal cues required            PT Short Term Goals - 03/20/20 1733      PT SHORT TERM GOAL #1   Title pt to be I with inital HEP    Baseline no previous HEP    Time 3    Period Weeks    Status New    Target Date 04/10/20      PT SHORT TERM GOAL #2   Title pt to verbalize and demo efficient posture and lifting mechanics    Baseline no knowlege of posture    Time 3    Period Weeks    Status New    Target Date 04/10/20             PT Long Term Goals - 04/12/20 1231      PT LONG TERM GOAL #1   Title pt to maintain cervical ROM with </= 1/10 pain for functional ROM required for ADLS    Baseline functional ROM in all  planes but noted 6-7/10 pain    Period Weeks    Status On-going    Target Date 05/01/20      PT LONG TERM GOAL #2   Title pt to maintain bil shoudler strenght with no report of pain for functional lifting/ carrying activitis for work and ADLs    Baseline functional strength but 6-7/10 poain during testing of flexion/ abduction    Period Weeks    Status On-going    Target Date 05/01/20      PT LONG TERM GOAL #3   Title pt to rpeort decreased spasm in bil upper trap and surrounding musculature to promote neck ROM with </= 2/10 pain    Baseline spasm noted in bil upper trap/ levator scapulae with 6-7/10 pain    Time 6    Period Weeks    Status On-going    Target Date 05/01/20      PT LONG TERM GOAL #4   Title pt to be I with all HEP given to maintain and progress current level of function    Baseline independent with current HEP    Time 6    Period Weeks    Status On-going    Target Date 05/01/20      PT LONG TERM GOAL #5   Title pt to maintain and current trunk ROM with </= 2/10 pain for improvement of condition    Baseline pain at 8/10 in all ranges    Time 6    Period Weeks    Status  New    Target Date 05/24/20      Additional Long Term Goals   Additional Long Term Goals Yes      PT LONG TERM GOAL #6   Title pt to be able to sit, stand and walk for >/= 1 hour with </= 2/10 at max pain in the back for functional endurance required for work related activities.    Baseline max pain is 10/10 with standing/ walking    Time 6    Period Weeks    Status New    Target Date 05/24/20                 Plan - 04/12/20 1224    Clinical Impression Statement pt presents with an updated script to include her low back to her POC. She demonstates functional trunk mobility with report of end range pain with flexion/ R sidebending and pain during motion with L side bending/ extension suggesting high likelidhood of muscle involvement. She responded well in the session to trigger point release for bil lumbar paraspinals and stretching noting pain dropped from an 8/10 to a 5/10. she contineus to do well in the neck noting decreased pain since previous session and relief since DN last session. She would benefit from physical therapy to decrease low back pain, promote efficient posture and positioning, and maximize overall function by addressing the deficits listed.    PT Frequency 2x / week    PT Duration 6 weeks    PT Treatment/Interventions ADLs/Self Care Home Management;Electrical Stimulation;Iontophoresis 4mg /ml Dexamethasone;Dry needling;Moist Heat;Ultrasound;Therapeutic activities;Therapeutic exercise;Balance training;Neuromuscular re-education;Manual techniques;Passive range of motion;Patient/family education;Taping    PT Next Visit Plan Resubmit to MCD,  discuss DN for upper trap/ levator scpauale, posture enducation, posterior shoulder strengthening, STW for the back, try DN? hip/ core strengthening    PT Home Exercise Plan [id number] - upper trap stretch, levator scapuale stretch, chin tuck (supine), scapular retraction. lower trunk rotation, supine marching, low back stretch  Consulted and Agree with Plan of Care Patient           Patient will benefit from skilled therapeutic intervention in order to improve the following deficits and impairments:  Obesity, Improper body mechanics, Increased muscle spasms, Decreased strength, Postural dysfunction, Pain, Decreased activity tolerance, Decreased endurance, Decreased range of motion  Visit Diagnosis: Cervicalgia  Abnormal posture  Chronic bilateral low back pain, unspecified whether sciatica present     Problem List Patient Active Problem List   Diagnosis Date Noted  . Hx of migraine headaches 04/09/2020  . Postpartum care following cesarean delivery 11/22/2019  . Contraceptive management 11/22/2019  . Positive RPR test 10/09/2019  . Migraine headache 07/12/2019  . Alpha thalassemia trait   . Vitamin D deficiency 09/16/2017  . Prediabetes 09/16/2017  . Depression 09/16/2017  . Class 3 severe obesity with serious comorbidity and body mass index (BMI) of 50.0 to 59.9 in adult ([place]) 09/16/2017  . PCOS (polycystic ovarian syndrome) 08/13/2017  . Mass 05/06/2017  . Vertigo 10/06/2016  . Anemia 09/26/2016  . Pseudotumor cerebri syndrome 08/28/2016  . Obesity hypoventilation syndrome ([place]) 08/28/2016  . Benign paroxysmal positional vertigo due to bilateral vestibular disorder 08/28/2016  . OSA (obstructive sleep apnea) 08/28/2016  . Hypersomnia with sleep apnea 08/28/2016  . Female hirsutism 08/28/2016  . GERD (gastroesophageal reflux disease) 02/24/2015  . Constipation 10/02/2014    Starr Lake PT, DPT, LAT, ATC  04/12/20  12:44 PM      [place] [facility] [address] [place], [place], [address] Phone: [phone]   Fax:  [phone]  Name: Maureen Lewis MRN: [id number] Date of Birth: 07-Jan-1996

## 2020-04-16 ENCOUNTER — Ambulatory Visit: Payer: BC Managed Care – PPO | Admitting: Physical Therapy

## 2020-04-16 ENCOUNTER — Other Ambulatory Visit: Payer: Self-pay

## 2020-04-16 ENCOUNTER — Encounter: Payer: Self-pay | Admitting: Adult Health

## 2020-04-16 ENCOUNTER — Encounter: Payer: Self-pay | Admitting: Physical Therapy

## 2020-04-16 DIAGNOSIS — G8929 Other chronic pain: Secondary | ICD-10-CM

## 2020-04-16 DIAGNOSIS — R293 Abnormal posture: Secondary | ICD-10-CM | POA: Diagnosis not present

## 2020-04-16 DIAGNOSIS — M542 Cervicalgia: Secondary | ICD-10-CM

## 2020-04-16 DIAGNOSIS — M545 Low back pain, unspecified: Secondary | ICD-10-CM

## 2020-04-16 NOTE — Therapy (Signed)
[place], [place], [address] Phone: [phone]   Fax:  [phone]  Physical Therapy Treatment  Patient Details  Name: Maureen Lewis MRN: [id number] Date of Birth: [birth year] Referring Provider (PT): Nafziger, Haynes Dage   Encounter Date: 04/16/2020   PT End of Session - 04/16/20 1148    Visit Number 4    Number of Visits 13    Date for PT Re-Evaluation 05/24/20    Authorization Type [employer]BS, MCD secondary resumitted on 04/16/2020    Authorization Time Period 04/05/2020 - 04/25/2020    Authorization - Visit Number 3    Authorization - Number of Visits 3    PT Start Time 8115    PT Stop Time 1236    PT Time Calculation (min) 48 min    Activity Tolerance Patient tolerated treatment well    Behavior During Therapy [facility] for tasks assessed/performed           Past Medical History:  Diagnosis Date  . Abnormal uterine bleeding   . Acid reflux   . Alpha thalassemia trait   . Amenorrhea   . Anemia    no current med.  . Back pain   . Cesarean delivery delivered 10/11/2019   10/09/2019 - primary CS for failed IOL  . Chest pain   . Complication of anesthesia    states had to keep giving her anesthesia during EGD  . Constipation   . Depression    "I'm good"  . Finger mass, left 03/2017   middle finger  . Gestational diabetes   . Gestational diabetes 05/23/2019   Current Diabetic Medications:  None  '[ ]'$  Aspirin 81 mg daily after 12 weeks (? A2/B GDM)  Required Referrals for A1GDM or A2GDM: '[ ]'$  Diabetes Education and Testing Supplies '[ ]'$  Nutrition Cousult  For A2/B GDM or higher classes of DM '[ ]'$  Diabetes Education and Testing Supplies '[ ]'$  Nutrition Counsult '[ ]'$  Fetal ECHO after 20 weeks  '[ ]'$  Eye exam for retina evaluation   Baseline and surveillance labs (  . Gestational hypertension 09/23/2019   Guidelines for Antenatal Testing and Sonography  (with updated ICD-10 codes)  Updated  2019/09/20 with Dr. Tama High   INDICATION U/S 2 X week NST/AFI  or full BPP wkly DELIVERY Diabetes   A1 - good control - O24.410    A2 - good control - O24.419      A2  - poor control or poor compliance - O24.419, E11.65   (Macrosomia or polyhydramnios) **E11.65 is extra code for poor control**    A2/B - O24.  Marland Kitchen Headache   . Hypertension   . Infection    UTI  . Irritable bowel syndrome (IBS)   . Joint pain   . Morbid obesity with body mass index (BMI) of 45.0 to 49.9 in adult [facility])   . Obesity during pregnancy, antepartum 04/06/2019   Body mass index is 53.71 kg/m.  Recommendations '[x]'$  Aspirin 81 mg daily after 12 weeks; discontinue after 36 weeks '[ ]'$  Nutrition consult '[ ]'$  Weight gain 11-20 lbs for singleton and 25-35 lbs for twin pregnancy (IOM guidelines) Higher class of obesity patients recommended to gain closer to lower limit  Weight loss is associated with adverse outcomes '[ ]'$  Baseline and surveillance labs (pulled in fr  . Plantar fasciitis, bilateral   . Polycystic ovary syndrome   . Prediabetes    "prediabetes"  . Pregnancy induced hypertension   . Shortness of  breath   . Sleep apnea    no CPAP use  . Supervision of normal first pregnancy 04/06/2019   [name]: [ x]Initial [ x]12 '[ ]'$ 20 '[ ]'$ 28 '[ ]'$ 32 '[ ]'$ 36 '[ ]'$ 38 '[ ]'$ 39 '[ ]'$ 40 Nursing Staff Provider Office Location [facility]  Dating   LMP Language  English  Anatomy US  Nml Flu Vaccine  Declined 07/28/19 Genetic Screen  NIPS: low risks   AFP:   negative  TDaP vaccine   08/25/19 Hgb A1C or  GTT Early A1C 5.8 Third trimester: GDM insulin Rhogam  NA   LAB RESULTS  Feeding Plan Breast Blood Type  . Vertigo 2017    Past Surgical History:  Procedure Laterality Date  . CESAREAN SECTION N/A 10/09/2019   Procedure: CESAREAN SECTION;  Surgeon: Aletha Halim, MD;  [facility];  Service: Obstetrics;  Laterality: N/A;  . COLONOSCOPY WITH PROPOFOL  10/07/2016  . ESOPHAGOGASTRODUODENOSCOPY  12/21/2015  . EXCISION MASS UPPER EXTREMETIES Left 04/21/2017    Procedure: EXCISION MASS LEFT MIDDLE FINGER;  Surgeon: Daryll Brod, MD;  Location: [place];  Service: Orthopedics;  Laterality: Left;  . IR FL GUIDED LOC OF NEEDLE/CATH TIP FOR SPINAL INJECTION RT  03/13/2020    There were no vitals filed for this visit.   Subjective Assessment - 04/16/20 1149    Subjective " The neck is doing pretty good, but today is a 7/10 in the neck which happens with I do activites. The back is alittle better but its still pretty intense at a 9/10"    Diagnostic tests 02/21/2020 x-ray IMPRESSION:Negative cervical spine radiographs. head CT scan 03/09/2020 IMPRESSION:  Normal CT scan of the head without contrast    Patient Stated Goals to get better, reduce pain,    Currently in Pain? Yes    Pain Score 7     Pain Orientation Right;Left    Pain Descriptors / Indicators Aching    Pain Type Chronic pain    Pain Onset More than a month ago    Pain Frequency Intermittent    Aggravating Factors  any activity    Pain Score 9    Pain Location Back    Pain Orientation Right;Left    Pain Descriptors / Indicators Aching    Pain Type Chronic pain              [facility] PT Assessment - 04/16/20 0001      Assessment   Medical Diagnosis Chest pain, unspecified type R07.9, Cervical radiculopathy M54.12    Referring Provider (PT) Nafziger, Haynes Dage      AROM   Cervical Flexion 36   end range pain   Cervical Extension 34    Cervical - Right Side Bend 48   contralateral end range pain   Cervical - Left Side Bend 50   contralateral end range pain   Cervical - Right Rotation 55    Cervical - Left Rotation 50    Lumbar Flexion 110    Lumbar Extension 32    Lumbar - Right Side Bend 30    Lumbar - Left Side Bend 20      Strength   Right Shoulder Flexion 4+/5    Right Shoulder Extension 5/5    Right Shoulder Internal Rotation 4+/5    Right Shoulder External Rotation 4+/5    Left Shoulder Flexion 4+/5    Left Shoulder Extension 4+/5    Left Shoulder  ABduction 4+/5    Left Shoulder Internal Rotation  4+/5    Left Shoulder External Rotation 4+/5                         [facility] Adult PT Treatment/Exercise - 04/16/20 0001      Lumbar Exercises: Stretches   Lower Trunk Rotation Limitations 1 x 12    Other Lumbar Stretch Exercise low back stretch 2 x 30 seconds walking hands out on to phsyioball      Modalities   Modalities Cryotherapy      Cryotherapy   Number Minutes Cryotherapy 10 Minutes    Cryotherapy Location Lumbar Spine;Shoulder   L shoulder   Type of Cryotherapy Ice pack      Manual Therapy   Manual therapy comments skilled palpation and monitoring of pt throughout TPDN    Soft tissue mobilization DTM along bil lumbar paraspinals and L upper trap       Neck Exercises: Stretches   Upper Trapezius Stretch 2 reps;Right;30 seconds    Levator Stretch 2 reps;Right;30 seconds            Trigger Point Dry Needling - 04/16/20 0001    Consent Given? Yes    Education Handout Provided Previously provided    Muscles Treated Back/Hip Erector spinae    Upper Trapezius Response Twitch reponse elicited;Palpable increased muscle length   bil   Erector spinae Response Twitch response elicited;Palpable increased muscle length   bil lumbar parapsinals               PT Education - 04/16/20 1206    Education Details reviewed HEP and discussed POC for both neck and back.    Person(s) Educated Patient    Methods Explanation;Verbal cues;Handout    Comprehension Verbalized understanding;Verbal cues required            PT Short Term Goals - 04/16/20 1152      PT SHORT TERM GOAL #1   Title pt to be I with inital HEP    Period Weeks    Status Achieved      PT SHORT TERM GOAL #2   Title pt to verbalize and demo efficient posture and lifting mechanics    Period Weeks    Status Achieved             PT Long Term Goals - 04/16/20 1153      PT LONG TERM GOAL #1   Title pt to maintain cervical ROM with </=  1/10 pain for functional ROM required for ADLS    Baseline functional ROM in all planes but noted 6-7/10 pain    Period Weeks    Status On-going    Target Date 05/28/20      PT LONG TERM GOAL #2   Title pt to maintain bil shoudler strenght with no report of pain for functional lifting/ carrying activitis for work and ADLs    Baseline functional strength but 6-7/10 poain during testing of flexion/ abduction    Period Weeks    Target Date 05/28/20      PT LONG TERM GOAL #3   Title pt to rpeort decreased spasm in bil upper trap and surrounding musculature to promote neck ROM with </= 2/10 pain    Baseline spasm noted in bil upper trap/ levator scapulae with 6-7/10 pain    Period Weeks    Target Date 05/28/20      PT LONG TERM GOAL #4   Title pt to be I with all HEP given to  maintain and progress current level of function    Baseline independent with current HEP    Time 6    Period Weeks    Status On-going    Target Date 05/28/20      PT LONG TERM GOAL #5   Title pt to maintain and current trunk ROM with </= 2/10 pain for improvement of condition    Baseline pain at 8/10 in all ranges    Time 6    Period Weeks    Status On-going    Target Date 05/28/20      PT LONG TERM GOAL #6   Title pt to be able to sit, stand and walk for >/= 1 hour with </= 2/10 at max pain in the back for functional endurance required for work related activities.    Baseline max pain is 10/10 with standing/ walking    Period Weeks    Status On-going                 Plan - 04/16/20 1227    Clinical Impression Statement pt reports some improvement in neck pain since starting 3 weeks ago but continues to report 6-7/10 pain that occurs with activity. she provide a new referral to include her back last session and noted relief of some pain but again continues to report elevated pain at 9/10 today. She met all her STG's today and is making progress toward her LTG's and demonstrates motivation to  continue/ improve. She continues to toleated DN well for L upper trap and bil lumbar parapsinals followed with DTM and stretching. she would benefit from continued  physical therapy to improve cervical and trunk mobility, reduce muscle spasm, promote efficient posture and maximize overall function by addressing the deficits listed.    PT Frequency 2x / week    PT Duration 6 weeks    PT Treatment/Interventions ADLs/Self Care Home Management;Electrical Stimulation;Iontophoresis '4mg'$ /ml Dexamethasone;Dry needling;Moist Heat;Ultrasound;Therapeutic activities;Therapeutic exercise;Balance training;Neuromuscular re-education;Manual techniques;Passive range of motion;Patient/family education;Taping    PT Next Visit Plan response to DN for upper trap/ levator scapulae, lumbar paraspinals, posture enducation, posterior shoulder strengthening, STW for the back, try DN? hip/ core strengthening    PT Home Exercise Plan [id number] - upper trap stretch, levator scapuale stretch, chin tuck (supine), scapular retraction. lower trunk rotation, supine marching, low back stretch    Consulted and Agree with Plan of Care Patient           Patient will benefit from skilled therapeutic intervention in order to improve the following deficits and impairments:  Obesity, Improper body mechanics, Increased muscle spasms, Decreased strength, Postural dysfunction, Pain, Decreased activity tolerance, Decreased endurance, Decreased range of motion  Visit Diagnosis: Cervicalgia  Abnormal posture  Chronic bilateral low back pain, unspecified whether sciatica present     Problem List Patient Active Problem List   Diagnosis Date Noted  . Hx of migraine headaches 04/09/2020  . Postpartum care following cesarean delivery 11/22/2019  . Contraceptive management 11/22/2019  . Positive RPR test 10/09/2019  . Migraine headache 07/12/2019  . Alpha thalassemia trait   . Vitamin D deficiency 09/16/2017  . Prediabetes 09/16/2017  .  Depression 09/16/2017  . Class 3 severe obesity with serious comorbidity and body mass index (BMI) of 50.0 to 59.9 in adult ([place]) 09/16/2017  . PCOS (polycystic ovarian syndrome) 08/13/2017  . Mass 05/06/2017  . Vertigo 10/06/2016  . Anemia 09/26/2016  . Pseudotumor cerebri syndrome 08/28/2016  . Obesity hypoventilation syndrome ([place]) 08/28/2016  . Benign paroxysmal  positional vertigo due to bilateral vestibular disorder 08/28/2016  . OSA (obstructive sleep apnea) 08/28/2016  . Hypersomnia with sleep apnea 08/28/2016  . Female hirsutism 08/28/2016  . GERD (gastroesophageal reflux disease) 02/24/2015  . Constipation 10/02/2014    Starr Lake PT, DPT, LAT, ATC  04/16/20  12:38 PM      [facility] [address] [place], [place], [address] Phone: [phone]   Fax:  [phone]  Name: Azhar Yogi MRN: [id number] Date of Birth: [birth year]

## 2020-04-18 ENCOUNTER — Encounter: Payer: Self-pay | Admitting: Family Medicine

## 2020-04-18 ENCOUNTER — Ambulatory Visit
Admission: RE | Admit: 2020-04-18 | Discharge: 2020-04-18 | Disposition: A | Discharge status: Home / Self Care | Payer: BC Managed Care – PPO | Source: Ambulatory Visit | Attending: Obstetrics | Admitting: Obstetrics

## 2020-04-18 ENCOUNTER — Other Ambulatory Visit: Payer: Self-pay

## 2020-04-18 DIAGNOSIS — R102 Pelvic and perineal pain: Secondary | ICD-10-CM | POA: Insufficient documentation

## 2020-04-19 ENCOUNTER — Encounter (INDEPENDENT_AMBULATORY_CARE_PROVIDER_SITE_OTHER): Payer: Self-pay

## 2020-04-24 ENCOUNTER — Other Ambulatory Visit: Payer: Self-pay

## 2020-04-24 ENCOUNTER — Ambulatory Visit (INDEPENDENT_AMBULATORY_CARE_PROVIDER_SITE_OTHER): Payer: BC Managed Care – PPO | Admitting: Family Medicine

## 2020-04-24 ENCOUNTER — Encounter (INDEPENDENT_AMBULATORY_CARE_PROVIDER_SITE_OTHER): Payer: Self-pay | Admitting: Family Medicine

## 2020-04-24 VITALS — BP 131/74 | HR 79 | Temp 98.2°F | Ht 72.0 in | Wt >= 6400 oz

## 2020-04-24 DIAGNOSIS — Z9189 Other specified personal risk factors, not elsewhere classified: Secondary | ICD-10-CM

## 2020-04-24 DIAGNOSIS — D563 Thalassemia minor: Secondary | ICD-10-CM | POA: Diagnosis not present

## 2020-04-24 DIAGNOSIS — R7303 Prediabetes: Secondary | ICD-10-CM

## 2020-04-24 DIAGNOSIS — I10 Essential (primary) hypertension: Secondary | ICD-10-CM | POA: Diagnosis not present

## 2020-04-24 DIAGNOSIS — E559 Vitamin D deficiency, unspecified: Secondary | ICD-10-CM

## 2020-04-24 DIAGNOSIS — G4733 Obstructive sleep apnea (adult) (pediatric): Secondary | ICD-10-CM

## 2020-04-24 DIAGNOSIS — Z6841 Body Mass Index (BMI) 40.0 and over, adult: Secondary | ICD-10-CM

## 2020-04-24 MED ORDER — METFORMIN HCL 500 MG PO TABS: 500.0000 mg | ORAL_TABLET | Freq: Every day | ORAL | 0 refills | Status: DC

## 2020-04-24 MED ORDER — VITAMIN D (ERGOCALCIFEROL) 1.25 MG (50000 UNIT) PO CAPS: 50000.0000 [IU] | ORAL_CAPSULE | ORAL | 0 refills | Status: DC

## 2020-04-25 ENCOUNTER — Encounter: Payer: Self-pay | Admitting: Obstetrics

## 2020-04-25 ENCOUNTER — Telehealth (INDEPENDENT_AMBULATORY_CARE_PROVIDER_SITE_OTHER): Payer: BC Managed Care – PPO | Admitting: Obstetrics

## 2020-04-25 ENCOUNTER — Telehealth: Payer: Self-pay | Admitting: Hematology and Oncology

## 2020-04-25 DIAGNOSIS — Z6841 Body Mass Index (BMI) 40.0 and over, adult: Secondary | ICD-10-CM

## 2020-04-25 DIAGNOSIS — R102 Pelvic and perineal pain: Secondary | ICD-10-CM | POA: Diagnosis not present

## 2020-04-25 NOTE — Telephone Encounter (Signed)
Received a new hem referral from Dr. Adair Patter for alpha thalassemia trait. [name] returned my call and has been scheduled to see Dr. Lorenso Courier on 7/28 at 1pm. Pt aware to arrive 15 minutes early.

## 2020-04-25 NOTE — Progress Notes (Signed)
GYNECOLOGY VIRTUAL VISIT ENCOUNTER NOTE  Provider location: [facility] [place] [facility] [place]   I connected with Maureen Lewis on 04/25/20 at  1:00 PM EDT by MyChart Video Encounter at home and verified that I am speaking with the correct person using two identifiers.   I discussed the limitations, risks, security and privacy concerns of performing an evaluation and management service virtually and the availability of in person appointments. I also discussed with the patient that there may be a patient responsible charge related to this service. The patient expressed understanding and agreed to proceed.   History:  Maureen Lewis is a 24 y.o. G43P1001 female being evaluated today for ultrasound results.  Having less pelvic pain.  She denies any abnormal vaginal discharge, bleeding, or other concerns.       Past Medical History:  Diagnosis Date   Abnormal uterine bleeding    Acid reflux    Alpha thalassemia trait    Amenorrhea    Anemia    no current med.   Back pain    Cesarean delivery delivered 10/11/2019   10/09/2019 - primary CS for failed IOL   Chest pain    Complication of anesthesia    states had to keep giving her anesthesia during EGD   Constipation    Depression    "I'm good"   Finger mass, left 03/2017   middle finger   Gestational diabetes    Gestational diabetes 05/23/2019   Current Diabetic Medications:  None  [ ]  Aspirin 81 mg daily after 12 weeks (? A2/B GDM)  Required Referrals for A1GDM or A2GDM: [ ]  Diabetes Education and Testing Supplies [ ]  Nutrition Cousult  For A2/B GDM or higher classes of DM [ ]  Diabetes Education and Testing Supplies [ ]  Nutrition Counsult [ ]  Fetal ECHO after 20 weeks  [ ]  Eye exam for retina evaluation   Baseline and surveillance labs (   Gestational hypertension 09/23/2019   Guidelines for Antenatal Testing and Sonography  (with updated ICD-10 codes)  Updated  14-Sep-2019 with Dr. Tama High  INDICATION U/S 2 X week  NST/AFI  or full BPP wkly DELIVERY Diabetes   A1 - good control - O24.410    A2 - good control - O24.419      A2  - poor control or poor compliance - O24.419, E11.65   (Macrosomia or polyhydramnios) **E11.65 is extra code for poor control**    A2/B - O24.   Headache    Hypertension    Infection    UTI   Irritable bowel syndrome (IBS)    Joint pain    Morbid obesity with body mass index (BMI) of 45.0 to 49.9 in adult [facility])    Obesity during pregnancy, antepartum 04/06/2019   Body mass index is 53.71 kg/m.  Recommendations [x]  Aspirin 81 mg daily after 12 weeks; discontinue after 36 weeks [ ]  Nutrition consult [ ]  Weight gain 11-20 lbs for singleton and 25-35 lbs for twin pregnancy (IOM guidelines) Higher class of obesity patients recommended to gain closer to lower limit  Weight loss is associated with adverse outcomes [ ]  Baseline and surveillance labs (pulled in fr   Plantar fasciitis, bilateral    Polycystic ovary syndrome    Prediabetes    "prediabetes"   Pregnancy induced hypertension    Shortness of breath    Sleep apnea    no CPAP use   Supervision of normal first pregnancy 04/06/2019   [name]: [ x]Initial [  x]12 [ ] 20 [ ] 28 [ ] 32 [ ] 36 [ ] 38 [ ] 39 [ ] 40 Nursing Staff Provider Office Location [facility]  Dating   LMP Language  English  Anatomy US  Nml Flu Vaccine  Declined 07/28/19 Genetic Screen  NIPS: low risks   AFP:   negative  TDaP vaccine   08/25/19 Hgb A1C or  GTT Early A1C 5.8 Third trimester: GDM insulin Rhogam  NA   LAB RESULTS  Feeding Plan Breast Blood Type   Vertigo 2017   Past Surgical History:  Procedure Laterality Date   CESAREAN SECTION N/A 10/09/2019   Procedure: CESAREAN SECTION;  Surgeon: Aletha Halim, MD;  [facility];  Service: Obstetrics;  Laterality: N/A;   COLONOSCOPY WITH PROPOFOL  10/07/2016   ESOPHAGOGASTRODUODENOSCOPY  12/21/2015   EXCISION MASS UPPER EXTREMETIES Left 04/21/2017   Procedure: EXCISION MASS LEFT  MIDDLE FINGER;  Surgeon: Daryll Brod, MD;  Location: [place];  Service: Orthopedics;  Laterality: Left;   IR FL GUIDED LOC OF NEEDLE/CATH TIP FOR SPINAL INJECTION RT  03/13/2020   The following portions of the patient's history were reviewed and updated as appropriate: allergies, current medications, past family history, past medical history, past social history, past surgical history and problem list.   Health Maintenance:  Normal pap and negative HRHPV on 04-11-2020.   Review of Systems:  Pertinent items noted in HPI and remainder of comprehensive ROS otherwise negative.  Physical Exam:   General:  Alert, oriented and cooperative. Patient appears to be in no acute distress.  Mental Status: Normal mood and affect. Normal behavior. Normal judgment and thought content.   Respiratory: Normal respiratory effort, no problems with respiration noted  Rest of physical exam deferred due to type of encounter  Labs and Imaging Results for orders placed or performed in visit on 04/11/20 (from the past 336 hour(s))  Cervicovaginal ancillary only( [place])   Collection Time: 04/11/20  2:44 PM  Result Value Ref Range   Neisseria Gonorrhea Negative    Chlamydia Negative    Trichomonas Negative    Bacterial Vaginitis (gardnerella) Negative    Candida Vaginitis Negative    Candida Glabrata Negative    Comment Normal Reference Range Candida Species - Negative    Comment Normal Reference Range Candida Galbrata - Negative    Comment Normal Reference Range Trichomonas - Negative    Comment Normal Reference [place] Chlamydia - Negative    Comment      Normal Reference Range Neisseria Gonorrhea - Negative   Comment      Normal Reference Range Bacterial Vaginosis - Negative  Cytology - PAP( [place])   Collection Time: 04/11/20  2:44 PM  Result Value Ref Range   High risk HPV Negative    Adequacy      Satisfactory for evaluation; transformation zone component PRESENT.    Diagnosis      - Negative for intraepithelial lesion or malignancy (NILM)   Comment Normal Reference Range HPV - Negative   HIV antibody (with reflex)   Collection Time: 04/11/20  3:03 PM  Result Value Ref Range   HIV Screen 4th Generation wRfx Non Reactive Non Reactive  RPR   Collection Time: 04/11/20  3:03 PM  Result Value Ref Range   RPR Ser Ql Non Reactive Non Reactive  Hepatitis B Surface AntiGEN   Collection Time: 04/11/20  3:03 PM  Result Value Ref Range   Hepatitis B Surface Ag Negative Negative  Hepatitis C Antibody   Collection  Time: 04/11/20  3:03 PM  Result Value Ref Range   Hep C Virus Ab <0.1 0.0 - 0.9 s/co ratio   US PELVIC COMPLETE WITH TRANSVAGINAL  Result Date: 04/18/2020 CLINICAL DATA:  Pelvic pain at for 1 month, LMP 12/18/2019, history of Caesarean section, on Depo Provera EXAM: TRANSABDOMINAL AND TRANSVAGINAL ULTRASOUND OF PELVIS TECHNIQUE: Both transabdominal and transvaginal ultrasound examinations of the pelvis were performed. Transabdominal technique was performed for global imaging of the pelvis including uterus, ovaries, adnexal regions, and pelvic cul-de-sac. It was necessary to proceed with endovaginal exam following the transabdominal exam to visualize the endometrium and ovaries. COMPARISON:  None FINDINGS: Uterus Measurements: 9.7 x 4.4 x 5.3 cm = volume: 120 mL. Anteverted. Normal morphology without mass Endometrium Thickness: 10 mm. No endometrial fluid or focal abnormality. Tiny amount of nonspecific endocervical canal fluid is noted. Right ovary Not visualized, likely obscured by bowel Left ovary Not visualized, likely obscured by bowel Other findings Trace free pelvic fluid.  No adnexal masses. IMPRESSION: Nonvisualization of ovaries. Otherwise normal exam. Electronically Signed   By: Lavonia Dana M.D.   On: 04/18/2020 14:30       Assessment and Plan:     1. Pelvic pain - now with much less pain - Ibuprofen prn  2. Class 3 severe obesity due to  excess calories without serious comorbidity with body mass index (BMI) of 45.0 to 49.9 in adult [facility]) - program of caloric reduction, exercise and behavioral modification recommended      I discussed the assessment and treatment plan with the patient. The patient was provided an opportunity to ask questions and all were answered. The patient agreed with the plan and demonstrated an understanding of the instructions.   The patient was advised to call back or seek an in-person evaluation/go to the ED if the symptoms worsen or if the condition fails to improve as anticipated.  I provided 15 minutes of face-to-face time during this encounter.   Baltazar Najjar, MD [facility] [place] [facility] 04/25/20

## 2020-04-26 NOTE — Progress Notes (Signed)
Chief Complaint:   OBESITY Maureen Lewis is here to discuss her progress with her obesity treatment plan along with follow-up of her obesity related diagnoses. [name] is on the Category 2 Plan + 100 calories with 8 oz of meat at dinner and states she is following her eating plan approximately 75% of the time. [name] states she is walking 30 minutes 3-4 times per week.  Today's visit was #: 2 Starting weight: 437 lbs Starting date: 04/09/2020 Today's weight: 445 lbs Today's date: 04/24/2020 Total lbs lost to date: 0 Total lbs lost since last in-office visit: 0  Interim History: Maureen Lewis felt she had to continuously remind herself to eat. Some days she states she did not want to eat and had to force herself to eat. Breakfast would be 2 boiled eggs, 1 piece of toast. Lunch would be a sandwich with [name] getting in 4 oz of meat, and occasionally would do fruit. Dinner would be fish or chicken with veggies on the side and mashed potatoes or rice (1/2 cup). Snack would be pretzel and peanut butter or a bag of chips.  Subjective:   1. Vitamin D deficiency [name] is not on supplementation, and she notes fatigue. Last Vit D level was 12.1. I discussed labs with the patient today.  2. OSA (obstructive sleep apnea) [name] is waiting for DME company to call for setting up her autoPAP. She has an appointment with Neurology on 06/28/2020.  3. Essential hypertension [name]'s blood pressure is well controlled on amlodipine. She denies chest pain, chest pressure, or headache. I discussed labs with the patient today.  4. Alpha thalassemia trait [name]'s MCV was 68, and her hemoglobin 12.7 and hematocrit 39.8. She has no history of seeing Hematology. I discussed labs with the patient today.  5. Pre-diabetes [name]'s last A1c was 6.0 and insulin 36.8. She was previously on metformin and insulin. She denies GI side effects of metformin previously. I discussed labs with the patient today.  6. At risk for diabetes  mellitus [name] is at higher than average risk for developing diabetes due to her obesity.   Assessment/Plan:   1. Vitamin D deficiency Low Vitamin D level contributes to fatigue and are associated with obesity, breast, and colon cancer. Maureen Lewis agreed to start prescription Vitamin D 50,000 IU every week with no refills. She will follow-up for routine testing of Vitamin D, at least 2-3 times per year to avoid over-replacement.  - Vitamin D, Ergocalciferol, (DRISDOL) 1.25 MG (50000 UNIT) CAPS capsule; Take 1 capsule (50,000 Units total) by mouth every 7 (seven) days.  Dispense: 4 capsule; Refill: 0  2. OSA (obstructive sleep apnea) Intensive lifestyle modifications are the first line treatment for this issue. We discussed several lifestyle modifications today and she will continue to work on diet, exercise and weight loss efforts. Maureen Lewis will follow up with DME company and Neurology. We will continue to monitor. Orders and follow up as documented in patient record.   Counseling  Sleep apnea is a condition in which breathing pauses or becomes shallow during sleep. This happens over and over during the night. This disrupts your sleep and keeps your body from getting the rest that it needs, which can cause tiredness and lack of energy (fatigue) during the day.  Sleep apnea treatment: If you were given a device to open your airway while you sleep, USE IT!  Sleep hygiene:   Limit or avoid alcohol, caffeinated beverages, and cigarettes, especially close to bedtime.   Do not eat  a large meal or eat spicy foods right before bedtime. This can lead to digestive discomfort that can make it hard for you to sleep.  Keep a sleep diary to help you and your health care provider figure out what could be causing your insomnia.  . Make your bedroom a dark, comfortable place where it is easy to fall asleep. ? Put up shades or blackout curtains to block light from outside. ? Use a white noise machine to block  noise. ? Keep the temperature cool. . Limit screen use before bedtime. This includes: ? Watching TV. ? Using your smartphone, tablet, or computer. . Stick to a routine that includes going to bed and waking up at the same times every day and night. This can help you fall asleep faster. Consider making a quiet activity, such as reading, part of your nighttime routine. . Try to avoid taking naps during the day so that you sleep better at night. . Get out of bed if you are still awake after 15 minutes of trying to sleep. Keep the lights down, but try reading or doing a quiet activity. When you feel sleepy, go back to bed.  3. Essential hypertension Maureen Lewis is working on healthy weight loss and exercise to improve blood pressure control. We will watch for signs of hypotension as she continues her lifestyle modifications. [name] will continue her medications, no change in dose.  4. Alpha thalassemia trait We will refer to Hematology/Oncology for evaluation. Maureen Lewis will follow up as directed.  - Ambulatory referral to Hematology  5. Pre-diabetes Maureen Lewis will continue to work on weight loss, exercise, and decreasing simple carbohydrates to help decrease the risk of diabetes. Maureen Lewis agreed to start metformin 500 mg PO daily with no refills.  - metFORMIN (GLUCOPHAGE) 500 MG tablet; Take 1 tablet (500 mg total) by mouth daily with breakfast.  Dispense: 30 tablet; Refill: 0  6. At risk for diabetes mellitus Maureen Lewis was given approximately 30 minutes of diabetes education and counseling today. We discussed intensive lifestyle modifications today with an emphasis on weight loss as well as increasing exercise and decreasing simple carbohydrates in her diet. We also reviewed medication options with an emphasis on risk versus benefit of those discussed.   Repetitive spaced learning was employed today to elicit superior memory formation and behavioral change.  7. Class 3 severe obesity with serious comorbidity and  body mass index (BMI) of 60.0 to 69.9 in adult, unspecified obesity type (HCC) Maureen Lewis is currently in the action stage of change. As such, her goal is to continue with weight loss efforts. She has agreed to the Category 3 Plan.   Exercise goals: No exercise has been prescribed at this time.  Behavioral modification strategies: increasing lean protein intake, increasing vegetables, meal planning and cooking strategies, keeping healthy foods in the home and planning for success.  Ashanty has agreed to follow-up with our clinic in 2 weeks. She was informed of the importance of frequent follow-up visits to maximize her success with intensive lifestyle modifications for her multiple health conditions.   Objective:   Blood pressure 131/74, pulse 79, temperature 98.2 F (36.8 C), temperature source Oral, height 6' (1.829 m), weight (!) 445 lb (201.9 kg), SpO2 95 %, not currently breastfeeding. Body mass index is 60.35 kg/m.  General: Cooperative, alert, well developed, in no acute distress. HEENT: Conjunctivae and lids unremarkable. Cardiovascular: Regular rhythm.  Lungs: Normal work of breathing. Neurologic: No focal deficits.   Lab Results  Component Value Date  CREATININE 0.60 04/09/2020   BUN 11 04/09/2020   NA 140 04/09/2020   K 4.8 04/09/2020   CL 104 04/09/2020   CO2 19 (L) 04/09/2020   Lab Results  Component Value Date   ALT 19 04/09/2020   AST 17 04/09/2020   ALKPHOS 124 (H) 04/09/2020   BILITOT 0.4 04/09/2020   Lab Results  Component Value Date   HGBA1C 6.0 (H) 04/09/2020   HGBA1C 5.8 (H) 04/06/2019   HGBA1C 6.0 04/27/2018   HGBA1C 5.8 (H) 08/13/2017   HGBA1C 5.9 03/08/2014   Lab Results  Component Value Date   INSULIN 36.8 (H) 04/09/2020   INSULIN 42.1 (H) 08/13/2017   Lab Results  Component Value Date   TSH 1.270 04/09/2020   Lab Results  Component Value Date   CHOL 156 04/09/2020   HDL 42 04/09/2020   LDLCALC 101 (H) 04/09/2020   TRIG 63 04/09/2020    CHOLHDL 3 04/27/2018   Lab Results  Component Value Date   WBC 6.6 04/09/2020   HGB 12.7 04/09/2020   HCT 39.8 04/09/2020   MCV 68 (L) 04/09/2020   PLT 318 04/09/2020   Lab Results  Component Value Date   FERRITIN 76 04/06/2019   Attestation Statements:   Reviewed by clinician on day of visit: allergies, medications, problem list, medical history, surgical history, family history, social history, and previous encounter notes.   I, Trixie Dredge, am acting as transcriptionist for Coralie Common, MD.  I have reviewed the above documentation for accuracy and completeness, and I agree with the above. - Jinny Blossom, MD

## 2020-05-01 ENCOUNTER — Ambulatory Visit: Payer: BC Managed Care – PPO | Attending: Adult Health | Admitting: Physical Therapy

## 2020-05-01 ENCOUNTER — Encounter: Payer: Self-pay | Admitting: Physical Therapy

## 2020-05-01 ENCOUNTER — Other Ambulatory Visit: Payer: Self-pay

## 2020-05-01 DIAGNOSIS — R293 Abnormal posture: Secondary | ICD-10-CM | POA: Diagnosis not present

## 2020-05-01 DIAGNOSIS — M542 Cervicalgia: Secondary | ICD-10-CM | POA: Diagnosis not present

## 2020-05-01 DIAGNOSIS — G8929 Other chronic pain: Secondary | ICD-10-CM | POA: Diagnosis not present

## 2020-05-01 DIAGNOSIS — M545 Low back pain, unspecified: Secondary | ICD-10-CM

## 2020-05-02 ENCOUNTER — Encounter: Payer: Self-pay | Admitting: Physical Therapy

## 2020-05-02 NOTE — Patient Instructions (Signed)
Access Code: [email or web address] by: Shanon Brow CarrollExercises  Shoulder extension with resistance - Neutral - 1 x daily - 7 x weekly - 3 sets - 10 reps  Scapular Retraction with Resistance - 1 x daily - 7 x weekly - 3 sets - 10 reps  Hooklying Clamshell with Resistance - 1 x daily - 7 x weekly - 3 sets - 10 reps  Seated Isometric Hip Adduction with Ball - 1 x daily - 7 x weekly - 3 sets - 10 reps

## 2020-05-02 NOTE — Therapy (Signed)
[place], [place], [address] Phone: [phone]   Fax:  [phone]  Physical Therapy Treatment  Patient Details  Name: Maureen Lewis MRN: [id number] Date of Birth: 25-Dec-1995 Referring Provider (PT): Nafziger, Haynes Dage   Encounter Date: 05/01/2020   PT End of Session - 05/01/20 1719    Visit Number 5    Number of Visits 13    Date for PT Re-Evaluation 05/24/20    Authorization Type BCBS, MCD secondary resumitted on 04/16/2020    PT Start Time 1630    PT Stop Time 1709    PT Time Calculation (min) 39 min    Activity Tolerance Patient tolerated treatment well    Behavior During Therapy [facility] for tasks assessed/performed           Past Medical History:  Diagnosis Date  . Abnormal uterine bleeding   . Acid reflux   . Alpha thalassemia trait   . Amenorrhea   . Anemia    no current med.  . Back pain   . Cesarean delivery delivered 10/11/2019   10/09/2019 - primary CS for failed IOL  . Chest pain   . Complication of anesthesia    states had to keep giving her anesthesia during EGD  . Constipation   . Depression    "I'm good"  . Finger mass, left 03/2017   middle finger  . Gestational diabetes   . Gestational diabetes 05/23/2019   Current Diabetic Medications:  None  [ ]  Aspirin 81 mg daily after 12 weeks (? A2/B GDM)  Required Referrals for A1GDM or A2GDM: [ ]  Diabetes Education and Testing Supplies [ ]  Nutrition Cousult  For A2/B GDM or higher classes of DM [ ]  Diabetes Education and Testing Supplies [ ]  Nutrition Counsult [ ]  Fetal ECHO after 20 weeks  [ ]  Eye exam for retina evaluation   Baseline and surveillance labs (  . Gestational hypertension 09/23/2019   Guidelines for Antenatal Testing and Sonography  (with updated ICD-10 codes)  Updated  10-01-19 with Dr. Tama High  INDICATION U/S 2 X week NST/AFI  or full BPP wkly DELIVERY Diabetes   A1 - good control - O24.410    A2 - good control -  O24.419      A2  - poor control or poor compliance - O24.419, E11.65   (Macrosomia or polyhydramnios) **E11.65 is extra code for poor control**    A2/B - O24.  Marland Kitchen Headache   . Hypertension   . Infection    UTI  . Irritable bowel syndrome (IBS)   . Joint pain   . Morbid obesity with body mass index (BMI) of 45.0 to 49.9 in adult [facility])   . Obesity during pregnancy, antepartum 04/06/2019   Body mass index is 53.71 kg/m.  Recommendations [x]  Aspirin 81 mg daily after 12 weeks; discontinue after 36 weeks [ ]  Nutrition consult [ ]  Weight gain 11-20 lbs for singleton and 25-35 lbs for twin pregnancy (IOM guidelines) Higher class of obesity patients recommended to gain closer to lower limit  Weight loss is associated with adverse outcomes [ ]  Baseline and surveillance labs (pulled in fr  . Plantar fasciitis, bilateral   . Polycystic ovary syndrome   . Prediabetes    "prediabetes"  . Pregnancy induced hypertension   . Shortness of breath   . Sleep apnea    no CPAP use  . Supervision of normal first pregnancy 04/06/2019   [name]: [ x]Initial [  x]12 [ ] 20 [ ] 28 [ ] 32 [ ] 36 [ ] 38 [ ] 39 [ ] 40 Nursing Staff Provider Office Location [facility]  Dating   LMP Language  English  Anatomy US  Nml Flu Vaccine  Declined 07/28/19 Genetic Screen  NIPS: low risks   AFP:   negative  TDaP vaccine   08/25/19 Hgb A1C or  GTT Early A1C 5.8 Third trimester: GDM insulin Rhogam  NA   LAB RESULTS  Feeding Plan Breast Blood Type  . Vertigo 2017    Past Surgical History:  Procedure Laterality Date  . CESAREAN SECTION N/A 10/09/2019   Procedure: CESAREAN SECTION;  Surgeon: Aletha Halim, MD;  Location: [facility];  Service: Obstetrics;  Laterality: N/A;  . COLONOSCOPY WITH PROPOFOL  10/07/2016  . ESOPHAGOGASTRODUODENOSCOPY  12/21/2015  . EXCISION MASS UPPER EXTREMETIES Left 04/21/2017   Procedure: EXCISION MASS LEFT MIDDLE FINGER;  Surgeon: Daryll Brod, MD;  Location: [place];  Service:  Orthopedics;  Laterality: Left;  . IR FL GUIDED LOC OF NEEDLE/CATH TIP FOR SPINAL INJECTION RT  03/13/2020    There were no vitals filed for this visit.   Subjective Assessment - 05/01/20 1637    Subjective Patient reports she is still in a lot of pain, mostly in her lower back. She works at [employer] and does a lot of walking. She does not think the dry needling helped much. She states her neck is feeling pretty good today.    How long can you sit comfortably? unlimited    How long can you stand comfortably? unlimited    How long can you walk comfortably? unlimited    Patient Stated Goals to get better, reduce pain,    Currently in Pain? Yes    Pain Score 6    lower back   Pain Location Neck    Pain Orientation Right;Left    Pain Descriptors / Indicators Aching    Pain Onset More than a month ago    Pain Frequency Intermittent    Pain Score 9    Pain Location Back    Pain Orientation Right;Left;Lower    Pain Descriptors / Indicators Aching    Pain Type Chronic pain    Pain Radiating Towards intermittent N/T down bil LE into both ft    Pain Onset More than a month ago    Pain Frequency Intermittent                                     PT Education - 05/01/20 1643    Education Details benefits of core strengthening after pregnancy, symptom management, HEP    Person(s) Educated Patient    Methods Explanation;Demonstration;Verbal cues;Tactile cues    Comprehension Verbalized understanding;Returned demonstration;Verbal cues required;Tactile cues required            PT Short Term Goals - 04/16/20 1152      PT SHORT TERM GOAL #1   Title pt to be I with inital HEP    Period Weeks    Status Achieved      PT SHORT TERM GOAL #2   Title pt to verbalize and demo efficient posture and lifting mechanics    Period Weeks    Status Achieved             PT Long Term Goals - 04/16/20 1153      PT LONG TERM GOAL #1   Title pt to  maintain cervical ROM  with </= 1/10 pain for functional ROM required for ADLS    Baseline functional ROM in all planes but noted 6-7/10 pain    Period Weeks    Status On-going    Target Date 05/28/20      PT LONG TERM GOAL #2   Title pt to maintain bil shoudler strenght with no report of pain for functional lifting/ carrying activitis for work and ADLs    Baseline functional strength but 6-7/10 poain during testing of flexion/ abduction    Period Weeks    Target Date 05/28/20      PT LONG TERM GOAL #3   Title pt to rpeort decreased spasm in bil upper trap and surrounding musculature to promote neck ROM with </= 2/10 pain    Baseline spasm noted in bil upper trap/ levator scapulae with 6-7/10 pain    Period Weeks    Target Date 05/28/20      PT LONG TERM GOAL #4   Title pt to be I with all HEP given to maintain and progress current level of function    Baseline independent with current HEP    Time 6    Period Weeks    Status On-going    Target Date 05/28/20      PT LONG TERM GOAL #5   Title pt to maintain and current trunk ROM with </= 2/10 pain for improvement of condition    Baseline pain at 8/10 in all ranges    Time 6    Period Weeks    Status On-going    Target Date 05/28/20      PT LONG TERM GOAL #6   Title pt to be able to sit, stand and walk for >/= 1 hour with </= 2/10 at max pain in the back for functional endurance required for work related activities.    Baseline max pain is 10/10 with standing/ walking    Period Weeks    Status On-going                 Plan - 05/01/20 1725    Clinical Impression Statement Patient continues to report high levels of pain in her neck and lower back.Therapy did STW to the lumbar paraspinals and upper glutes to release trigger points. Therapy educated on the importance of core strengthening after having a baby. Therapy worked on hip and core strenghtening with verbal cuing for breathing to improve core stability. Therapy added some new exercises  and patient exhibited signs of weakness and fatigue.    Personal Factors and Comorbidities Age;Past/Current Experience;Comorbidity 3+    Comorbidities hx of depression, DM, vertigo    Stability/Clinical Decision Making Evolving/Moderate complexity    Clinical Decision Making Moderate    Rehab Potential Good    PT Frequency 2x / week    PT Duration 6 weeks    PT Treatment/Interventions ADLs/Self Care Home Management;Electrical Stimulation;Iontophoresis 4mg /ml Dexamethasone;Dry needling;Moist Heat;Ultrasound;Therapeutic activities;Therapeutic exercise;Balance training;Neuromuscular re-education;Manual techniques;Passive range of motion;Patient/family education;Taping    PT Next Visit Plan response to DN for upper trap/ levator scapulae, lumbar paraspinals, posture enducation, posterior shoulder strengthening, STW for the back, try DN? hip/ core strengthening    PT Home Exercise Plan [id number] - upper trap stretch, levator scapuale stretch, chin tuck (supine), scapular retraction. lower trunk rotation, supine marching, low back stretch, clamshells, rows, shoulder extension;Access Code: [email or web address] by: Oren Bracket.Shoulder extension with resistance - Neutral - 1 x daily - 7 x weekly - 3 sets - 10  reps.Scapular Retraction with Resistance - 1 x daily - 7 x weekly - 3 sets - 10 reps.Hooklying Clamshell with Resistance - 1 x daily - 7 x weekly - 3 sets - 10 reps.Seated Isometric Hip Adduction with Ball - 1 x daily - 7 x weekly - 3 sets - 10 reps    Consulted and Agree with Plan of Care Patient           Patient will benefit from skilled therapeutic intervention in order to improve the following deficits and impairments:  Obesity, Improper body mechanics, Increased muscle spasms, Decreased strength, Postural dysfunction, Pain, Decreased activity tolerance, Decreased endurance, Decreased range of motion  Visit Diagnosis: Cervicalgia  Abnormal posture  Chronic bilateral low back pain,  unspecified whether sciatica present     Problem List Patient Active Problem List   Diagnosis Date Noted  . Hx of migraine headaches 04/09/2020  . Postpartum care following cesarean delivery 11/22/2019  . Contraceptive management 11/22/2019  . Positive RPR test 10/09/2019  . Migraine headache 07/12/2019  . Alpha thalassemia trait   . Vitamin D deficiency 09/16/2017  . Prediabetes 09/16/2017  . Depression 09/16/2017  . Class 3 severe obesity with serious comorbidity and body mass index (BMI) of 50.0 to 59.9 in adult ([place]) 09/16/2017  . PCOS (polycystic ovarian syndrome) 08/13/2017  . Mass 05/06/2017  . Vertigo 10/06/2016  . Anemia 09/26/2016  . Pseudotumor cerebri syndrome 08/28/2016  . Obesity hypoventilation syndrome ([place]) 08/28/2016  . Benign paroxysmal positional vertigo due to bilateral vestibular disorder 08/28/2016  . OSA (obstructive sleep apnea) 08/28/2016  . Hypersomnia with sleep apnea 08/28/2016  . Female hirsutism 08/28/2016  . GERD (gastroesophageal reflux disease) 02/24/2015  . Constipation 10/02/2014    Carney Living PT DPT  05/02/2020, 8:47 AM   Minna Merritts SPT  05/02/2020  During this treatment session, the therapist was present, participating in and directing the treatment.    [place], [place], [address] Phone: [phone]   Fax:  [phone]  Name: Maureen Lewis MRN: [id number] Date of Birth: [birth year]

## 2020-05-03 ENCOUNTER — Ambulatory Visit: Payer: BC Managed Care – PPO | Admitting: Physical Therapy

## 2020-05-04 ENCOUNTER — Other Ambulatory Visit: Payer: Self-pay

## 2020-05-04 ENCOUNTER — Encounter: Payer: Self-pay | Admitting: Adult Health

## 2020-05-04 ENCOUNTER — Encounter: Payer: Self-pay | Admitting: Physician Assistant

## 2020-05-04 ENCOUNTER — Ambulatory Visit (INDEPENDENT_AMBULATORY_CARE_PROVIDER_SITE_OTHER): Payer: BC Managed Care – PPO | Admitting: Adult Health

## 2020-05-04 VITALS — BP 134/84 | Temp 98.0°F | Wt >= 6400 oz

## 2020-05-04 DIAGNOSIS — R1012 Left upper quadrant pain: Secondary | ICD-10-CM

## 2020-05-04 DIAGNOSIS — R197 Diarrhea, unspecified: Secondary | ICD-10-CM | POA: Diagnosis not present

## 2020-05-04 DIAGNOSIS — R195 Other fecal abnormalities: Secondary | ICD-10-CM | POA: Diagnosis not present

## 2020-05-04 MED ORDER — SUCRALFATE 1 GM/10ML PO SUSP: 1.0000 g | Freq: Three times a day (TID) | ORAL | 0 refills | Status: DC

## 2020-05-04 MED ORDER — PANTOPRAZOLE SODIUM 40 MG PO TBEC: 40.0000 mg | DELAYED_RELEASE_TABLET | Freq: Every day | ORAL | 3 refills | Status: DC

## 2020-05-04 NOTE — Progress Notes (Signed)
Subjective:    Patient ID: Maureen Lewis, female    DOB: 1996-10-15, 24 y.o.   MRN: [id number]  HPI 24 year old female who  has a past medical history of Abnormal uterine bleeding, Acid reflux, Alpha thalassemia trait, Amenorrhea, Anemia, Back pain, Cesarean delivery delivered (10/11/2019), Chest pain, Complication of anesthesia, Constipation, Depression, Finger mass, left (03/2017), Gestational diabetes, Gestational diabetes (05/23/2019), Gestational hypertension (09/23/2019), Headache, Hypertension, Infection, Irritable bowel syndrome (IBS), Joint pain, Morbid obesity with body mass index (BMI) of 45.0 to 49.9 in adult [facility]), Obesity during pregnancy, antepartum (04/06/2019), Plantar fasciitis, bilateral, Polycystic ovary syndrome, Prediabetes, Pregnancy induced hypertension, Shortness of breath, Sleep apnea, Supervision of normal first pregnancy (04/06/2019), and Vertigo (2017).   She presents to the office today for continued abdominal pain.  She has been seen by GYN and had a transvaginal ultrasound done showed nonvisualization of the ovaries, otherwise a normal exam.  She was prescribed Motrin for pain relief.  Unfortunantly she reports that she continues to have abdominal pain as generalized but is worse in the left upper quadrant.  Over the last 2 weeks she is developed black runny/watery stools.  She does take iron but has been for some time.  Abdominal pain seems to intensify slightly when she takes Motrin.  She does endorse nausea without vomiting and the feeling of bloating.  She denies fevers or chills.    Review of Systems See HPI   Past Medical History:  Diagnosis Date   Abnormal uterine bleeding    Acid reflux    Alpha thalassemia trait    Amenorrhea    Anemia    no current med.   Back pain    Cesarean delivery delivered 10/11/2019   10/09/2019 - primary CS for failed IOL   Chest pain    Complication of anesthesia    states had to keep giving her anesthesia during EGD    Constipation    Depression    "I'm good"   Finger mass, left 03/2017   middle finger   Gestational diabetes    Gestational diabetes 05/23/2019   Current Diabetic Medications:  None  '[ ]'$  Aspirin 81 mg daily after 12 weeks (? A2/B GDM)  Required Referrals for A1GDM or A2GDM: '[ ]'$  Diabetes Education and Testing Supplies '[ ]'$  Nutrition Cousult  For A2/B GDM or higher classes of DM '[ ]'$  Diabetes Education and Testing Supplies '[ ]'$  Nutrition Counsult '[ ]'$  Fetal ECHO after 20 weeks  '[ ]'$  Eye exam for retina evaluation   Baseline and surveillance labs (   Gestational hypertension 09/23/2019   Guidelines for Antenatal Testing and Sonography  (with updated ICD-10 codes)  Updated  08-Sep-2019 with Dr. Tama High  INDICATION U/S 2 X week NST/AFI  or full BPP wkly DELIVERY Diabetes   A1 - good control - O24.410    A2 - good control - O24.419      A2  - poor control or poor compliance - O24.419, E11.65   (Macrosomia or polyhydramnios) **E11.65 is extra code for poor control**    A2/B - O24.   Headache    Hypertension    Infection    UTI   Irritable bowel syndrome (IBS)    Joint pain    Morbid obesity with body mass index (BMI) of 45.0 to 49.9 in adult [facility])    Obesity during pregnancy, antepartum 04/06/2019   Body mass index is 53.71 kg/m.  Recommendations '[x]'$  Aspirin 81 mg daily after 12 weeks; discontinue after  36 weeks '[ ]'$  Nutrition consult '[ ]'$  Weight gain 11-20 lbs for singleton and 25-35 lbs for twin pregnancy (IOM guidelines) Higher class of obesity patients recommended to gain closer to lower limit  Weight loss is associated with adverse outcomes '[ ]'$  Baseline and surveillance labs (pulled in fr   Plantar fasciitis, bilateral    Polycystic ovary syndrome    Prediabetes    "prediabetes"   Pregnancy induced hypertension    Shortness of breath    Sleep apnea    no CPAP use   Supervision of normal first pregnancy 04/06/2019   [name]: [ x]Initial [ x]12 '[ ]'$ 20 '[ ]'$ 28 '[ ]'$ 32 [  ]36 '[ ]'$ 38 '[ ]'$ 39 '[ ]'$ 40 Nursing Staff Provider Office Location [facility]  Dating   LMP Language  English  Anatomy US  Nml Flu Vaccine  Declined 07/28/19 Genetic Screen  NIPS: low risks   AFP:   negative  TDaP vaccine   08/25/19 Hgb A1C or  GTT Early A1C 5.8 Third trimester: GDM insulin Rhogam  NA   LAB RESULTS  Feeding Plan Breast Blood Type   Vertigo 2017    Social History   Socioeconomic History   Marital status: Single    Spouse name: Not on file   Number of children: Not on file   Years of education: Not on file   Highest education level: Not on file  Occupational History   Occupation: Press photographer associate    Employer: [employer]  Tobacco Use   Smoking status: Never Smoker   Smokeless tobacco: Never Used  Scientific laboratory technician Use: Never used  Substance and Sexual Activity   Alcohol use: Not Currently    Comment: social    Drug use: No   Sexual activity: Not Currently    Birth control/protection: None, Injection  Other Topics Concern   Not on file  Social History Narrative   Right handed    Soda sometimes   Lives with mom and cousin, a grandmother in [place] also helps care for her.       She is in nursing school.    Social Determinants of Health   Financial Resource Strain:    Difficulty of Paying Living Expenses:   Food Insecurity:    Worried About Charity fundraiser in the Last Year:    Arboriculturist in the Last Year:   Transportation Needs:    Film/video editor (Medical):    Lack of Transportation (Non-Medical):   Physical Activity:    Days of Exercise per Week:    Minutes of Exercise per Session:   Stress:    Feeling of Stress :   Social Connections:    Frequency of Communication with Friends and Family:    Frequency of Social Gatherings with Friends and Family:    Attends Religious Services:    Active Member of Clubs or Organizations:    Attends Archivist Meetings:    Marital Status:   Intimate Partner Violence:      Fear of Current or Ex-Partner:    Emotionally Abused:    Physically Abused:    Sexually Abused:     Past Surgical History:  Procedure Laterality Date   CESAREAN SECTION N/A 10/09/2019   Procedure: CESAREAN SECTION;  Surgeon: Aletha Halim, MD;  [facility];  Service: Obstetrics;  Laterality: N/A;   COLONOSCOPY WITH PROPOFOL  10/07/2016   ESOPHAGOGASTRODUODENOSCOPY  12/21/2015   EXCISION MASS UPPER EXTREMETIES Left 04/21/2017  Procedure: EXCISION MASS LEFT MIDDLE FINGER;  Surgeon: Daryll Brod, MD;  Location: [place];  Service: Orthopedics;  Laterality: Left;   IR FL GUIDED LOC OF NEEDLE/CATH TIP FOR SPINAL INJECTION RT  03/13/2020    Family History  Problem Relation Age of Onset   Diabetes Maternal Aunt    Hypertension Maternal Uncle    Depression Maternal Grandfather    Diabetes Maternal Grandfather    Hypertension Maternal Grandfather    Arthritis Mother    High blood pressure Mother    Depression Mother    Sleep apnea Mother    Obesity Mother    Diabetes Mother    Hypertension Mother    Sleep apnea Father    Obesity Father     Allergies  Allergen Reactions   Bee Pollen Hives, Shortness Of Breath and Swelling   Bee Venom Hives, Shortness Of Breath and Swelling   Hydrocodone-Acetaminophen Anaphylaxis    No problem when she takes Tylenol   Peach Flavor Hives, Shortness Of Breath and Other (See Comments)    SWELLING OF MOUTH   Pollen Extract Hives, Shortness Of Breath and Swelling    Current Outpatient Medications on File Prior to Visit  Medication Sig Dispense Refill   amLODipine (NORVASC) 10 MG tablet Take 1 tablet (10 mg total) by mouth daily. 30 tablet 2   baclofen (LIORESAL) 10 MG tablet Take 1 tablet (10 mg total) by mouth 2 (two) times daily. 30 each 0   Blood Pressure Monitoring KIT 1 kit by Does not apply route once a week. 1 kit 0   ferrous sulfate 325 (65 FE) MG tablet Take 1 tablet (325 mg  total) by mouth every other day. 45 tablet 1   hydrochlorothiazide (HYDRODIURIL) 25 MG tablet Take 1 tablet (25 mg total) by mouth daily. 30 tablet 2   ibuprofen (ADVIL) 800 MG tablet Take 1 tablet (800 mg total) by mouth every 8 (eight) hours as needed. 30 tablet 5   loratadine (CLARITIN) 10 MG tablet Take 10 mg by mouth daily as needed for allergies.      medroxyPROGESTERone (DEPO-PROVERA) 150 MG/ML injection Inject 1 mL (150 mg total) into the muscle every 3 (three) months. 1 mL 3   metFORMIN (GLUCOPHAGE) 500 MG tablet Take 1 tablet (500 mg total) by mouth daily with breakfast. 30 tablet 0   ondansetron (ZOFRAN-ODT) 4 MG disintegrating tablet Take 4 mg by mouth every 8 (eight) hours as needed.      polyethylene glycol powder (GLYCOLAX/MIRALAX) 17 GM/SCOOP powder      topiramate (TOPAMAX) 25 MG tablet Take 2 tablets (50 mg total) by mouth 2 (two) times daily. 180 tablet 1   Vitamin D, Ergocalciferol, (DRISDOL) 1.25 MG (50000 UNIT) CAPS capsule Take 1 capsule (50,000 Units total) by mouth every 7 (seven) days. 4 capsule 0   No current facility-administered medications on file prior to visit.    BP 134/84    Temp 98 F (36.7 C)    Wt (!) 444 lb (201.4 kg)    BMI 60.22 kg/m       Objective:   Physical Exam Vitals and nursing note reviewed.  Constitutional:      Appearance: She is well-developed. She is obese.  Abdominal:     General: Bowel sounds are normal. There is no distension.     Palpations: Abdomen is soft.     Tenderness: There is abdominal tenderness in the epigastric area and left upper quadrant. There is no guarding or  rebound.  Genitourinary:    Rectum: Guaiac result negative.  Musculoskeletal:        General: Normal range of motion.  Skin:    General: Skin is warm and dry.     Capillary Refill: Capillary refill takes less than 2 seconds.  Neurological:     General: No focal deficit present.     Mental Status: She is alert and oriented to person, place, and  time.  Psychiatric:        Mood and Affect: Mood normal.        Behavior: Behavior normal.        Thought Content: Thought content normal.        Judgment: Judgment normal.        Assessment & Plan:  1. Left upper quadrant abdominal pain -Possible GERD versus PUD versus duodenal ulcer.  We will start her on Protonix and Carafate.  Check CT abdomen and pelvis due to length of time that she has had abdominal pain and check stool culture -Stop Motrin - pantoprazole (PROTONIX) 40 MG tablet; Take 1 tablet (40 mg total) by mouth daily.  Dispense: 30 tablet; Refill: 3 - Stool culture; Future - CT Abdomen Pelvis Wo Contrast; Future - Basic Metabolic Panel; Future - Ambulatory referral to Gastroenterology - sucralfate (CARAFATE) 1 GM/10ML suspension; Take 10 mLs (1 g total) by mouth 4 (four) times daily -  with meals and at bedtime.  Dispense: 420 mL; Refill: 0  2. Dark stools -Hemoccult negative today - pantoprazole (PROTONIX) 40 MG tablet; Take 1 tablet (40 mg total) by mouth daily.  Dispense: 30 tablet; Refill: 3 - CT Abdomen Pelvis Wo Contrast; Future - Basic Metabolic Panel; Future - Ambulatory referral to Gastroenterology  3. Diarrhea, unspecified type -Doubt infectious etiology since she has not been on any antibiotics recently will check stool culture.  - pantoprazole (PROTONIX) 40 MG tablet; Take 1 tablet (40 mg total) by mouth daily.  Dispense: 30 tablet; Refill: 3 - Stool culture; Future - CT Abdomen Pelvis Wo Contrast; Future - Basic Metabolic Panel; Future - Ambulatory referral to Gastroenterology - sucralfate (CARAFATE) 1 GM/10ML suspension; Take 10 mLs (1 g total) by mouth 4 (four) times daily -  with meals and at bedtime.  Dispense: 420 mL; Refill: 0   Dorothyann Peng, NP

## 2020-05-05 LAB — BASIC METABOLIC PANEL
BUN: 10 mg/dL (ref 7–25)
CO2: 21 mmol/L (ref 20–32)
Calcium: 9.4 mg/dL (ref 8.6–10.2)
Chloride: 107 mmol/L (ref 98–110)
Creat: 0.6 mg/dL (ref 0.50–1.10)
Glucose, Bld: 82 mg/dL (ref 65–99)
Potassium: 4.2 mmol/L (ref 3.5–5.3)
Sodium: 140 mmol/L (ref 135–146)

## 2020-05-07 ENCOUNTER — Ambulatory Visit: Payer: BC Managed Care – PPO

## 2020-05-07 ENCOUNTER — Other Ambulatory Visit: Payer: Self-pay | Admitting: Adult Health

## 2020-05-08 ENCOUNTER — Encounter: Payer: Self-pay | Admitting: Physical Therapy

## 2020-05-08 ENCOUNTER — Ambulatory Visit (INDEPENDENT_AMBULATORY_CARE_PROVIDER_SITE_OTHER): Payer: BC Managed Care – PPO | Admitting: Family Medicine

## 2020-05-08 ENCOUNTER — Other Ambulatory Visit: Payer: Self-pay

## 2020-05-08 ENCOUNTER — Encounter (INDEPENDENT_AMBULATORY_CARE_PROVIDER_SITE_OTHER): Payer: Self-pay | Admitting: Family Medicine

## 2020-05-08 ENCOUNTER — Ambulatory Visit: Payer: BC Managed Care – PPO | Admitting: Physical Therapy

## 2020-05-08 VITALS — BP 126/76 | HR 92 | Temp 98.0°F | Ht 72.0 in | Wt >= 6400 oz

## 2020-05-08 DIAGNOSIS — I10 Essential (primary) hypertension: Secondary | ICD-10-CM | POA: Diagnosis not present

## 2020-05-08 DIAGNOSIS — Z9189 Other specified personal risk factors, not elsewhere classified: Secondary | ICD-10-CM

## 2020-05-08 DIAGNOSIS — G8929 Other chronic pain: Secondary | ICD-10-CM

## 2020-05-08 DIAGNOSIS — M545 Low back pain, unspecified: Secondary | ICD-10-CM

## 2020-05-08 DIAGNOSIS — R293 Abnormal posture: Secondary | ICD-10-CM | POA: Diagnosis not present

## 2020-05-08 DIAGNOSIS — R7303 Prediabetes: Secondary | ICD-10-CM

## 2020-05-08 DIAGNOSIS — Z6841 Body Mass Index (BMI) 40.0 and over, adult: Secondary | ICD-10-CM | POA: Diagnosis not present

## 2020-05-08 DIAGNOSIS — M542 Cervicalgia: Secondary | ICD-10-CM | POA: Diagnosis not present

## 2020-05-08 MED ORDER — WEGOVY 0.25 MG/0.5ML ~~LOC~~ SOAJ: 0.2500 mg | SUBCUTANEOUS | 0 refills | Status: DC

## 2020-05-08 NOTE — Therapy (Signed)
[place], [place], [address] Phone: [phone]   Fax:  [phone]  Physical Therapy Treatment  Patient Details  Name: Maureen Lewis MRN: [id number] Date of Birth: 1996-02-07 Referring Provider (PT): Nafziger, Haynes Dage   Encounter Date: 05/08/2020   PT End of Session - 05/08/20 1635    Visit Number 6    Number of Visits 13    Date for PT Re-Evaluation 05/24/20    Authorization Type [employer]BS, M[employer] secondary resumitted on 04/16/2020    Authorization Time Period 04/05/2020 - 04/25/2020    Authorization - Visit Number 3    Authorization - Number of Visits 3    PT Start Time 6440    PT Stop Time 1715    PT Time Calculation (min) 42 min    Activity Tolerance Patient tolerated treatment well    Behavior During Therapy [facility] for tasks assessed/performed           Past Medical History:  Diagnosis Date  . Abnormal uterine bleeding   . Acid reflux   . Alpha thalassemia trait   . Amenorrhea   . Anemia    no current med.  . Back pain   . Cesarean delivery delivered 10/11/2019   10/09/2019 - primary CS for failed IOL  . Chest pain   . Complication of anesthesia    states had to keep giving her anesthesia during EGD  . Constipation   . Depression    "I'm good"  . Finger mass, left 03/2017   middle finger  . Gestational diabetes   . Gestational diabetes 05/23/2019   Current Diabetic Medications:  None  [ ]  Aspirin 81 mg daily after 12 weeks (? A2/B GDM)  Required Referrals for A1GDM or A2GDM: [ ]  Diabetes Education and Testing Supplies [ ]  Nutrition Cousult  For A2/B GDM or higher classes of DM [ ]  Diabetes Education and Testing Supplies [ ]  Nutrition Counsult [ ]  Fetal ECHO after 20 weeks  [ ]  Eye exam for retina evaluation   Baseline and surveillance labs (  . Gestational hypertension 09/23/2019   Guidelines for Antenatal Testing and Sonography  (with updated ICD-10 codes)  Updated  2019-10-05 with Dr. Tama High   INDICATION U/S 2 X week NST/AFI  or full BPP wkly DELIVERY Diabetes   A1 - good control - O24.410    A2 - good control - O24.419      A2  - poor control or poor compliance - O24.419, E11.65   (Macrosomia or polyhydramnios) **E11.65 is extra code for poor control**    A2/B - O24.  Marland Kitchen Headache   . Hypertension   . Infection    UTI  . Irritable bowel syndrome (IBS)   . Joint pain   . Morbid obesity with body mass index (BMI) of 45.0 to 49.9 in adult [facility])   . Obesity during pregnancy, antepartum 04/06/2019   Body mass index is 53.71 kg/m.  Recommendations [x]  Aspirin 81 mg daily after 12 weeks; discontinue after 36 weeks [ ]  Nutrition consult [ ]  Weight gain 11-20 lbs for singleton and 25-35 lbs for twin pregnancy (IOM guidelines) Higher class of obesity patients recommended to gain closer to lower limit  Weight loss is associated with adverse outcomes [ ]  Baseline and surveillance labs (pulled in fr  . Plantar fasciitis, bilateral   . Polycystic ovary syndrome   . Prediabetes    "prediabetes"  . Pregnancy induced hypertension   . Shortness of  breath   . Sleep apnea    no CPAP use  . Supervision of normal first pregnancy 04/06/2019   [name]: [ x]Initial [ x]12 [ ] 20 [ ] 28 [ ] 32 [ ] 36 [ ] 38 [ ] 39 [ ] 40 Nursing Staff Provider Office Location [facility]  Dating   LMP Language  English  Anatomy US  Nml Flu Vaccine  Declined 07/28/19 Genetic Screen  NIPS: low risks   AFP:   negative  TDaP vaccine   08/25/19 Hgb A1C or  GTT Early A1C 5.8 Third trimester: GDM insulin Rhogam  NA   LAB RESULTS  Feeding Plan Breast Blood Type  . Vertigo 2017    Past Surgical History:  Procedure Laterality Date  . CESAREAN SECTION N/A 10/09/2019   Procedure: CESAREAN SECTION;  Surgeon: Aletha Halim, MD;  [facility];  Service: Obstetrics;  Laterality: N/A;  . COLONOSCOPY WITH PROPOFOL  10/07/2016  . ESOPHAGOGASTRODUODENOSCOPY  12/21/2015  . EXCISION MASS UPPER EXTREMETIES Left 04/21/2017    Procedure: EXCISION MASS LEFT MIDDLE FINGER;  Surgeon: Daryll Brod, MD;  Location: [place];  Service: Orthopedics;  Laterality: Left;  . IR FL GUIDED LOC OF NEEDLE/CATH TIP FOR SPINAL INJECTION RT  03/13/2020    There were no vitals filed for this visit.   Subjective Assessment - 05/08/20 1633    Subjective "I am feeling okay today. I did not have to work today, so I laid in bed all morning. Pain is 4/10 for neck and back."    How long can you sit comfortably? unlimited    How long can you stand comfortably? unlimited    How long can you walk comfortably? unlimited    Diagnostic tests 02/21/2020 x-ray IMPRESSION:Negative cervical spine radiographs. head CT scan 03/09/2020 IMPRESSION:  Normal CT scan of the head without contrast    Patient Stated Goals to get better, reduce pain,    Currently in Pain? Yes    Pain Score 4     Pain Location Neck    Pain Orientation Right;Left    Pain Descriptors / Indicators Aching    Pain Type Chronic pain    Pain Onset More than a month ago    Pain Frequency Intermittent    Aggravating Factors  moving too fast, any activity moving neck    Pain Relieving Factors ice, mucle relaxer, pain meds    Pain Score 4    Pain Location Back    Pain Orientation Right;Left;Lower    Pain Descriptors / Indicators Aching    Pain Onset More than a month ago    Pain Frequency Intermittent    Aggravating Factors  lifting, moving too much, walking, standing    Pain Relieving Factors ice, medication                             [facility] Adult PT Treatment/Exercise - 05/08/20 0001      Neck Exercises: Theraband   Shoulder Extension 15 reps   x 2 sets; blue band   Rows 15 reps   x 2 sets; blue band   Shoulder External Rotation 15 reps   green theraband; R&L    Shoulder Internal Rotation 15 reps   green theraband; R&L    Other Theraband Exercises Serratus Punch 1x15; green band    Other Theraband Exercises lower trap strengtheing 1 x 15  elbows propped on the wall with yellow band.       Lumbar  Exercises: Stretches   Other Lumbar Stretch Exercise low back stretch 2 x 30 seconds walking hands out on to phsyioball      Lumbar Exercises: Seated   Sit to Stand 10 reps    Sit to Stand Limitations x 3 starting from lowered table and graduall increasing height.      Lumbar Exercises: Supine   Pelvic Tilt 1 rep;20 seconds   holding 3 seconds ea.   Heel Slides 20 reps   10 bil   Heel Slides Limitations maintaining abdominal draw in manuever    Other Supine Lumbar Exercises pressing the ball down with bil UE onto red phyisoball onto legs 1 x 15   breathing in and relaxing/ out while pressing     Knee/Hip Exercises: Sidelying   Hip ABduction Strengthening;Both;1 set   going to fatigue                   PT Short Term Goals - 04/16/20 1152      PT SHORT TERM GOAL #1   Title pt to be I with inital HEP    Period Weeks    Status Achieved      PT SHORT TERM GOAL #2   Title pt to verbalize and demo efficient posture and lifting mechanics    Period Weeks    Status Achieved             PT Long Term Goals - 04/16/20 1153      PT LONG TERM GOAL #1   Title pt to maintain cervical ROM with </= 1/10 pain for functional ROM required for ADLS    Baseline functional ROM in all planes but noted 6-7/10 pain    Period Weeks    Status On-going    Target Date 05/28/20      PT LONG TERM GOAL #2   Title pt to maintain bil shoudler strenght with no report of pain for functional lifting/ carrying activitis for work and ADLs    Baseline functional strength but 6-7/10 poain during testing of flexion/ abduction    Period Weeks    Target Date 05/28/20      PT LONG TERM GOAL #3   Title pt to rpeort decreased spasm in bil upper trap and surrounding musculature to promote neck ROM with </= 2/10 pain    Baseline spasm noted in bil upper trap/ levator scapulae with 6-7/10 pain    Period Weeks    Target Date 05/28/20      PT  LONG TERM GOAL #4   Title pt to be I with all HEP given to maintain and progress current level of function    Baseline independent with current HEP    Time 6    Period Weeks    Status On-going    Target Date 05/28/20      PT LONG TERM GOAL #5   Title pt to maintain and current trunk ROM with </= 2/10 pain for improvement of condition    Baseline pain at 8/10 in all ranges    Time 6    Period Weeks    Status On-going    Target Date 05/28/20      PT LONG TERM GOAL #6   Title pt to be able to sit, stand and walk for >/= 1 hour with </= 2/10 at max pain in the back for functional endurance required for work related activities.    Baseline max pain is 10/10 with standing/ walking    Period  Weeks    Status On-going                 Plan - 05/08/20 1711    Clinical Impression Statement pt reports 4/10 pain in the back and neck today but notes it is due to being in bed all day before her session. due to lower level of pain focus session on shoulder and core and hip strengthening with increased reps to promote strength as well as endurance. Pt demonstrate no signs of pain throughout session and report feeling fine after session.    PT Next Visit Plan response to DN for upper trap/ levator scapulae, lumbar paraspinals, posture enducation, posterior shoulder strengthening, STW for the back, try DN? hip/ core strengthening, work on endurance training.    PT Home Exercise Plan [email or web address] - upper trap stretch, levator scapuale stretch, chin tuck (supine), scapular retraction. lower trunk rotation, supine marching, low back stretch, clamshells, rows, shoulder extension;Access Code: [email or web address] by: Oren Bracket.Shoulder extension with resistance - Neutral - 1 x daily - 7 x weekly - 3 sets - 10 reps.Scapular Retraction with Resistance - 1 x daily - 7 x weekly - 3 sets - 10 reps.Hooklying Clamshell with Resistance - 1 x daily - 7 x weekly - 3 sets - 10 reps.Seated Isometric Hip Adduction  with Ball - 1 x daily - 7 x weekly - 3 sets - 10 reps    Consulted and Agree with Plan of Care Patient           Patient will benefit from skilled therapeutic intervention in order to improve the following deficits and impairments:     Visit Diagnosis: Cervicalgia  Abnormal posture  Chronic bilateral low back pain, unspecified whether sciatica present     Problem List Patient Active Problem List   Diagnosis Date Noted  . Hx of migraine headaches 04/09/2020  . Postpartum care following cesarean delivery 11/22/2019  . Contraceptive management 11/22/2019  . Positive RPR test 10/09/2019  . Migraine headache 07/12/2019  . Alpha thalassemia trait   . Vitamin D deficiency 09/16/2017  . Prediabetes 09/16/2017  . Depression 09/16/2017  . Class 3 severe obesity with serious comorbidity and body mass index (BMI) of 50.0 to 59.9 in adult ([place]) 09/16/2017  . PCOS (polycystic ovarian syndrome) 08/13/2017  . Mass 05/06/2017  . Vertigo 10/06/2016  . Anemia 09/26/2016  . Pseudotumor cerebri syndrome 08/28/2016  . Obesity hypoventilation syndrome ([place]) 08/28/2016  . Benign paroxysmal positional vertigo due to bilateral vestibular disorder 08/28/2016  . OSA (obstructive sleep apnea) 08/28/2016  . Hypersomnia with sleep apnea 08/28/2016  . Female hirsutism 08/28/2016  . GERD (gastroesophageal reflux disease) 02/24/2015  . Constipation 10/02/2014    Starr Lake PT, DPT, LAT, ATC  05/08/20  5:18 PM      [facility] [address] [place], [place], [address] Phone: [phone]   Fax:  [phone]  Name: Maureen Lewis MRN: [id number] Date of Birth: [birth year]

## 2020-05-09 ENCOUNTER — Telehealth: Payer: Self-pay | Admitting: Obstetrics

## 2020-05-10 ENCOUNTER — Other Ambulatory Visit: Payer: Self-pay

## 2020-05-10 ENCOUNTER — Ambulatory Visit
Admission: RE | Admit: 2020-05-10 | Discharge: 2020-05-10 | Disposition: A | Discharge status: Home / Self Care | Payer: BC Managed Care – PPO | Source: Ambulatory Visit | Attending: Adult Health | Admitting: Adult Health

## 2020-05-10 ENCOUNTER — Ambulatory Visit: Payer: BC Managed Care – PPO | Admitting: Physical Therapy

## 2020-05-10 ENCOUNTER — Other Ambulatory Visit: Payer: BC Managed Care – PPO

## 2020-05-10 DIAGNOSIS — G8929 Other chronic pain: Secondary | ICD-10-CM | POA: Diagnosis not present

## 2020-05-10 DIAGNOSIS — R293 Abnormal posture: Secondary | ICD-10-CM

## 2020-05-10 DIAGNOSIS — R197 Diarrhea, unspecified: Secondary | ICD-10-CM

## 2020-05-10 DIAGNOSIS — M542 Cervicalgia: Secondary | ICD-10-CM

## 2020-05-10 DIAGNOSIS — R1012 Left upper quadrant pain: Secondary | ICD-10-CM

## 2020-05-10 DIAGNOSIS — M545 Low back pain, unspecified: Secondary | ICD-10-CM

## 2020-05-10 DIAGNOSIS — R195 Other fecal abnormalities: Secondary | ICD-10-CM

## 2020-05-10 DIAGNOSIS — K921 Melena: Secondary | ICD-10-CM | POA: Diagnosis not present

## 2020-05-10 NOTE — Therapy (Signed)
[place], [place], [address] Phone: [phone]   Fax:  [phone]  Physical Therapy Treatment  Patient Details  Name: Maureen Lewis MRN: [id number] Date of Birth: December 13, 1995 Referring Provider (PT): Nafziger, Haynes Dage   Encounter Date: 05/10/2020   PT End of Session - 05/10/20 1631    Visit Number 7    Number of Visits 13    Date for PT Re-Evaluation 05/24/20    Authorization Type [employer], MCD secondary resumitted on 04/16/2020    Authorization Time Period 05/01/2020 - 06/11/2020    Authorization - Visit Number 3    Authorization - Number of Visits 12    PT Start Time 2563    PT Stop Time 1713    PT Time Calculation (min) 42 min    Activity Tolerance Patient tolerated treatment well    Behavior During Therapy [facility] for tasks assessed/performed           Past Medical History:  Diagnosis Date  . Abnormal uterine bleeding   . Acid reflux   . Alpha thalassemia trait   . Amenorrhea   . Anemia    no current med.  . Back pain   . Cesarean delivery delivered 10/11/2019   10/09/2019 - primary CS for failed IOL  . Chest pain   . Complication of anesthesia    states had to keep giving her anesthesia during EGD  . Constipation   . Depression    "I'm good"  . Finger mass, left 03/2017   middle finger  . Gestational diabetes   . Gestational diabetes 05/23/2019   Current Diabetic Medications:  None  [ ]  Aspirin 81 mg daily after 12 weeks (? A2/B GDM)  Required Referrals for A1GDM or A2GDM: [ ]  Diabetes Education and Testing Supplies [ ]  Nutrition Cousult  For A2/B GDM or higher classes of DM [ ]  Diabetes Education and Testing Supplies [ ]  Nutrition Counsult [ ]  Fetal ECHO after 20 weeks  [ ]  Eye exam for retina evaluation   Baseline and surveillance labs (  . Gestational hypertension 09/23/2019   Guidelines for Antenatal Testing and Sonography  (with updated ICD-10 codes)  Updated  09/15/19 with Dr. Tama High  INDICATION U/S 2 X week NST/AFI  or full BPP wkly DELIVERY Diabetes   A1 - good control - O24.410    A2 - good control - O24.419      A2  - poor control or poor compliance - O24.419, E11.65   (Macrosomia or polyhydramnios) **E11.65 is extra code for poor control**    A2/B - O24.  Marland Kitchen Headache   . Hypertension   . Infection    UTI  . Irritable bowel syndrome (IBS)   . Joint pain   . Morbid obesity with body mass index (BMI) of 45.0 to 49.9 in adult [facility])   . Obesity during pregnancy, antepartum 04/06/2019   Body mass index is 53.71 kg/m.  Recommendations [x]  Aspirin 81 mg daily after 12 weeks; discontinue after 36 weeks [ ]  Nutrition consult [ ]  Weight gain 11-20 lbs for singleton and 25-35 lbs for twin pregnancy (IOM guidelines) Higher class of obesity patients recommended to gain closer to lower limit  Weight loss is associated with adverse outcomes [ ]  Baseline and surveillance labs (pulled in fr  . Plantar fasciitis, bilateral   . Polycystic ovary syndrome   . Prediabetes    "prediabetes"  . Pregnancy induced hypertension   . Shortness of  breath   . Sleep apnea    no CPAP use  . Supervision of normal first pregnancy 04/06/2019   [name]: [ x]Initial [ x]12 [ ] 20 [ ] 28 [ ] 32 [ ] 36 [ ] 38 [ ] 39 [ ] 40 Nursing Staff Provider Office Location [facility]  Dating   LMP Language  English  Anatomy US  Nml Flu Vaccine  Declined 07/28/19 Genetic Screen  NIPS: low risks   AFP:   negative  TDaP vaccine   08/25/19 Hgb A1C or  GTT Early A1C 5.8 Third trimester: GDM insulin Rhogam  NA   LAB RESULTS  Feeding Plan Breast Blood Type  . Vertigo 2017    Past Surgical History:  Procedure Laterality Date  . CESAREAN SECTION N/A 10/09/2019   Procedure: CESAREAN SECTION;  Surgeon: Aletha Halim, MD;  Location: [facility] LD ORS;  Service: Obstetrics;  Laterality: N/A;  . COLONOSCOPY WITH PROPOFOL  10/07/2016  . ESOPHAGOGASTRODUODENOSCOPY  12/21/2015  . EXCISION MASS UPPER EXTREMETIES Left 04/21/2017    Procedure: EXCISION MASS LEFT MIDDLE FINGER;  Surgeon: Daryll Brod, MD;  Location: [place];  Service: Orthopedics;  Laterality: Left;  . IR FL GUIDED LOC OF NEEDLE/CATH TIP FOR SPINAL INJECTION RT  03/13/2020    There were no vitals filed for this visit.   Subjective Assessment - 05/10/20 1634    Subjective "feeling sore after the last session but no pain today, I didn't work today."    Currently in Pain? No/denies    Pain Score 0              [facility] PT Assessment - 05/10/20 0001      Assessment   Medical Diagnosis Chest pain, unspecified type R07.9, Cervical radiculopathy M54.12    Referring Provider (PT) Nafziger, Haynes Dage                         [facility] Adult PT Treatment/Exercise - 05/10/20 0001      Neck Exercises: Theraband   Other Theraband Exercises scapular retraction with ER 2 x 12 with blue theraband      Neck Exercises: Standing   Other Standing Exercises palloff press 2 x 15 7#  using freemotion    Other Standing Exercises rows 2 x 15 13#   using free motion     Lumbar Exercises: Stretches   Other Lumbar Stretch Exercise low back stretch 2 x 30 seconds walking hands out on to phsyioball      Lumbar Exercises: Machines for Strengthening   Leg Press 1 x 10 60#, 1 x 10 80#, 1 x 10 100#      Lumbar Exercises: Standing   Heel Raises 20 reps   x 2 sets   Other Standing Lumbar Exercises step up on 8 inch step 1 x 15 bil      Neck Exercises: Stretches   Upper Trapezius Stretch 1 rep;Right;Left;30 seconds    Levator Stretch Right;Left;1 rep;30 seconds                    PT Short Term Goals - 04/16/20 1152      PT SHORT TERM GOAL #1   Title pt to be I with inital HEP    Period Weeks    Status Achieved      PT SHORT TERM GOAL #2   Title pt to verbalize and demo efficient posture and lifting mechanics    Period Weeks    Status Achieved  PT Long Term Goals - 04/16/20 1153      PT LONG TERM GOAL #1    Title pt to maintain cervical ROM with </= 1/10 pain for functional ROM required for ADLS    Baseline functional ROM in all planes but noted 6-7/10 pain    Period Weeks    Status On-going    Target Date 05/28/20      PT LONG TERM GOAL #2   Title pt to maintain bil shoudler strenght with no report of pain for functional lifting/ carrying activitis for work and ADLs    Baseline functional strength but 6-7/10 poain during testing of flexion/ abduction    Period Weeks    Target Date 05/28/20      PT LONG TERM GOAL #3   Title pt to rpeort decreased spasm in bil upper trap and surrounding musculature to promote neck ROM with </= 2/10 pain    Baseline spasm noted in bil upper trap/ levator scapulae with 6-7/10 pain    Period Weeks    Target Date 05/28/20      PT LONG TERM GOAL #4   Title pt to be I with all HEP given to maintain and progress current level of function    Baseline independent with current HEP    Time 6    Period Weeks    Status On-going    Target Date 05/28/20      PT LONG TERM GOAL #5   Title pt to maintain and current trunk ROM with </= 2/10 pain for improvement of condition    Baseline pain at 8/10 in all ranges    Time 6    Period Weeks    Status On-going    Target Date 05/28/20      PT LONG TERM GOAL #6   Title pt to be able to sit, stand and walk for >/= 1 hour with </= 2/10 at max pain in the back for functional endurance required for work related activities.    Baseline max pain is 10/10 with standing/ walking    Period Weeks    Status On-going                 Plan - 05/10/20 1716    Clinical Impression Statement pt reported no pain today but also notes she hasn't had to work today. Cotninued focus on gross LE/ UE strengthening with increased reps to promote endurance. She was able to perform all exercises reporting mild soreness inthe low back that resolved with stretching. no pain end of session.    PT Treatment/Interventions ADLs/Self Care Home  Management;Electrical Stimulation;Iontophoresis 4mg /ml Dexamethasone;Dry needling;Moist Heat;Ultrasound;Therapeutic activities;Therapeutic exercise;Balance training;Neuromuscular re-education;Manual techniques;Passive range of motion;Patient/family education;Taping    PT Next Visit Plan response to DN for upper trap/ levator scapulae, lumbar paraspinals, posture enducation, posterior shoulder strengthening,  hip/ core strengthening, work on endurance training.    PT Home Exercise Plan [id number] - upper trap stretch, levator scapuale stretch, chin tuck (supine), scapular retraction. lower trunk rotation, supine marching, low back stretch, clamshells, rows, shoulder extension;Access Code: [email or web address] by: Oren Bracket.Shoulder extension with resistance - Neutral - 1 x daily - 7 x weekly - 3 sets - 10 reps.Scapular Retraction with Resistance - 1 x daily - 7 x weekly - 3 sets - 10 reps.Hooklying Clamshell with Resistance - 1 x daily - 7 x weekly - 3 sets - 10 reps.Seated Isometric Hip Adduction with Ball - 1 x daily - 7 x weekly - 3 sets - 10  reps    Consulted and Agree with Plan of Care Patient           Patient will benefit from skilled therapeutic intervention in order to improve the following deficits and impairments:  Obesity, Improper body mechanics, Increased muscle spasms, Decreased strength, Postural dysfunction, Pain, Decreased activity tolerance, Decreased endurance, Decreased range of motion  Visit Diagnosis: Cervicalgia  Abnormal posture  Chronic bilateral low back pain, unspecified whether sciatica present     Problem List Patient Active Problem List   Diagnosis Date Noted  . Hx of migraine headaches 04/09/2020  . Postpartum care following cesarean delivery 11/22/2019  . Contraceptive management 11/22/2019  . Positive RPR test 10/09/2019  . Migraine headache 07/12/2019  . Alpha thalassemia trait   . Vitamin D deficiency 09/16/2017  . Prediabetes 09/16/2017  .  Depression 09/16/2017  . Class 3 severe obesity with serious comorbidity and body mass index (BMI) of 50.0 to 59.9 in adult ([place]) 09/16/2017  . PCOS (polycystic ovarian syndrome) 08/13/2017  . Mass 05/06/2017  . Vertigo 10/06/2016  . Anemia 09/26/2016  . Pseudotumor cerebri syndrome 08/28/2016  . Obesity hypoventilation syndrome ([place]) 08/28/2016  . Benign paroxysmal positional vertigo due to bilateral vestibular disorder 08/28/2016  . OSA (obstructive sleep apnea) 08/28/2016  . Hypersomnia with sleep apnea 08/28/2016  . Female hirsutism 08/28/2016  . GERD (gastroesophageal reflux disease) 02/24/2015  . Constipation 10/02/2014   Starr Lake PT, DPT, LAT, ATC  05/10/20  5:22 PM      [place] [facility] [address] [place], [place], [address] Phone: [phone]   Fax:  [phone]  Name: Maureen Lewis MRN: [id number] Date of Birth: [birth year]

## 2020-05-10 NOTE — Progress Notes (Signed)
Chief Complaint:   OBESITY Maureen Lewis is here to discuss her progress with her obesity treatment plan along with follow-up of her obesity related diagnoses. Maureen Lewis is on the Category 3 Plan and states she is following her eating plan approximately 100% of the time. Maureen Lewis states she is doing 0 minutes 0 times per week.  Today's visit was #: 3 Starting weight: 437 lbs Starting date: 04/09/2020 Today's weight: 447 lbs Today's date: 05/08/2020 Total lbs lost to date: 0 Total lbs lost since last in-office visit: 0  Interim History: Maureen Lewis has been following the plan almost completely. She is eating on the plan and getting everything in. Snacks are mostly pretzels and peanut butter, otherwise she is not getting the calories in.  Subjective:   1. Essential hypertension Maureen Lewis's blood pressure is well controlled. She denies chest pain, chest pressure, or headaches.  2. Pre-diabetes Maureen Lewis's last A1c 6.0 and insulin 36.8. She is on metformin and denies GI side effects.  3. At risk for diabetes mellitus Maureen Lewis is at higher than average risk for developing diabetes due to her obesity.   Assessment/Plan:   1. Essential hypertension Dianey is working on healthy weight loss and exercise to improve blood pressure control. We will watch for signs of hypotension as she continues her lifestyle modifications. Maureen Lewis will continue her current medications, no refill needed and no change in dose.  2. Pre-diabetes Maureen Lewis will continue to work on weight loss, exercise, and decreasing simple carbohydrates to help decrease the risk of diabetes. Maureen Lewis agreed to discontinue metformin, change in therapy.  3. At risk for diabetes mellitus Maureen Lewis was given approximately 15 minutes of diabetes education and counseling today. We discussed intensive lifestyle modifications today with an emphasis on weight loss as well as increasing exercise and decreasing simple carbohydrates in her diet. We also reviewed medication  options with an emphasis on risk versus benefit of those discussed.   Repetitive spaced learning was employed today to elicit superior memory formation and behavioral change.  4. Class 3 severe obesity with serious comorbidity and body mass index (BMI) of 60.0 to 69.9 in adult, unspecified obesity type (HCC) Maureen Lewis is currently in the action stage of change. As such, her goal is to continue with weight loss efforts. She has agreed to the Category 3 Plan.   We discussed various medication options to help Maureen Lewis with her weight loss efforts and we both agreed to start Wegovy 0.25 mg SubQ weekly with no refills.  - Semaglutide-Weight Management (WEGOVY) 0.25 MG/0.5ML SOAJ; Inject 0.25 mg into the skin once a week.  Dispense: 2.24 mL; Refill: 0  Exercise goals: No exercise has been prescribed at this time.  Behavioral modification strategies: increasing lean protein intake, meal planning and cooking strategies, keeping healthy foods in the home and better snacking choices.  Maureen Lewis has agreed to follow-up with our clinic in 2 weeks. She was informed of the importance of frequent follow-up visits to maximize her success with intensive lifestyle modifications for her multiple health conditions.   Objective:   Blood pressure 126/76, pulse 92, temperature 98 F (36.7 C), temperature source Oral, height 6' (1.829 m), weight (!) 447 lb (202.8 kg), SpO2 94 %, not currently breastfeeding. Body mass index is 60.62 kg/m.  General: Cooperative, alert, well developed, in no acute distress. HEENT: Conjunctivae and lids unremarkable. Cardiovascular: Regular rhythm.  Lungs: Normal work of breathing. Neurologic: No focal deficits.   Lab Results  Component Value Date   CREATININE 0.60 05/04/2020  BUN 10 05/04/2020   NA 140 05/04/2020   K 4.2 05/04/2020   CL 107 05/04/2020   CO2 21 05/04/2020   Lab Results  Component Value Date   ALT 19 04/09/2020   AST 17 04/09/2020   ALKPHOS 124 (H) 04/09/2020    BILITOT 0.4 04/09/2020   Lab Results  Component Value Date   HGBA1C 6.0 (H) 04/09/2020   HGBA1C 5.8 (H) 04/06/2019   HGBA1C 6.0 04/27/2018   HGBA1C 5.8 (H) 08/13/2017   HGBA1C 5.9 03/08/2014   Lab Results  Component Value Date   INSULIN 36.8 (H) 04/09/2020   INSULIN 42.1 (H) 08/13/2017   Lab Results  Component Value Date   TSH 1.270 04/09/2020   Lab Results  Component Value Date   CHOL 156 04/09/2020   HDL 42 04/09/2020   LDLCALC 101 (H) 04/09/2020   TRIG 63 04/09/2020   CHOLHDL 3 04/27/2018   Lab Results  Component Value Date   WBC 6.6 04/09/2020   HGB 12.7 04/09/2020   HCT 39.8 04/09/2020   MCV 68 (L) 04/09/2020   PLT 318 04/09/2020   Lab Results  Component Value Date   FERRITIN 76 04/06/2019   Attestation Statements:   Reviewed by clinician on day of visit: allergies, medications, problem list, medical history, surgical history, family history, social history, and previous encounter notes.   I, Trixie Dredge, am acting as transcriptionist for Coralie Common, MD.  I have reviewed the above documentation for accuracy and completeness, and I agree with the above. - Jinny Blossom, MD

## 2020-05-10 NOTE — Addendum Note (Signed)
Addended by: Marrion Coy on: 05/10/2020 10:32 AM   Modules accepted: Orders

## 2020-05-14 LAB — STOOL CULTURE
MICRO NUMBER:: 10737795
MICRO NUMBER:: 10737796
MICRO NUMBER:: 10737797
SHIGA RESULT:: NOT DETECTED
SPECIMEN QUALITY:: ADEQUATE
SPECIMEN QUALITY:: ADEQUATE
SPECIMEN QUALITY:: ADEQUATE

## 2020-05-15 ENCOUNTER — Ambulatory Visit: Payer: BC Managed Care – PPO | Admitting: Physical Therapy

## 2020-05-16 ENCOUNTER — Inpatient Hospital Stay: Payer: BC Managed Care – PPO | Attending: Hematology and Oncology | Admitting: Hematology and Oncology

## 2020-05-16 ENCOUNTER — Inpatient Hospital Stay: Payer: BC Managed Care – PPO

## 2020-05-16 ENCOUNTER — Encounter: Payer: Self-pay | Admitting: Hematology and Oncology

## 2020-05-16 ENCOUNTER — Other Ambulatory Visit: Payer: Self-pay

## 2020-05-16 VITALS — BP 128/98 | HR 103 | Temp 98.7°F | Resp 18 | Ht 72.0 in | Wt >= 6400 oz

## 2020-05-16 DIAGNOSIS — E669 Obesity, unspecified: Secondary | ICD-10-CM | POA: Diagnosis not present

## 2020-05-16 DIAGNOSIS — G4733 Obstructive sleep apnea (adult) (pediatric): Secondary | ICD-10-CM | POA: Diagnosis not present

## 2020-05-16 DIAGNOSIS — Z8481 Family history of carrier of genetic disease: Secondary | ICD-10-CM | POA: Diagnosis not present

## 2020-05-16 DIAGNOSIS — Z832 Family history of diseases of the blood and blood-forming organs and certain disorders involving the immune mechanism: Secondary | ICD-10-CM | POA: Insufficient documentation

## 2020-05-16 DIAGNOSIS — F329 Major depressive disorder, single episode, unspecified: Secondary | ICD-10-CM | POA: Diagnosis not present

## 2020-05-16 DIAGNOSIS — R718 Other abnormality of red blood cells: Secondary | ICD-10-CM | POA: Diagnosis not present

## 2020-05-16 DIAGNOSIS — D563 Thalassemia minor: Secondary | ICD-10-CM | POA: Diagnosis not present

## 2020-05-16 DIAGNOSIS — D500 Iron deficiency anemia secondary to blood loss (chronic): Secondary | ICD-10-CM

## 2020-05-16 LAB — CMP (CANCER CENTER ONLY)
ALT: 17 U/L (ref 0–44)
AST: 16 U/L (ref 15–41)
Albumin: 4.1 g/dL (ref 3.5–5.0)
Alkaline Phosphatase: 100 U/L (ref 38–126)
Anion gap: 9 (ref 5–15)
BUN: 11 mg/dL (ref 6–20)
CO2: 23 mmol/L (ref 22–32)
Calcium: 10.5 mg/dL — ABNORMAL HIGH (ref 8.9–10.3)
Chloride: 107 mmol/L (ref 98–111)
Creatinine: 0.79 mg/dL (ref 0.44–1.00)
GFR, Est AFR Am: >60 mL/min (ref >60)
GFR, Estimated: >60 mL/min (ref >60)
Glucose, Bld: 89 mg/dL (ref 70–99)
Potassium: 3.8 mmol/L (ref 3.5–5.1)
Sodium: 139 mmol/L (ref 135–145)
Total Bilirubin: 0.7 mg/dL (ref 0.3–1.2)
Total Protein: 8.4 g/dL — ABNORMAL HIGH (ref 6.5–8.1)

## 2020-05-16 LAB — RETIC PANEL
Immature Retic Fract: 19.8 % — ABNORMAL HIGH (ref 2.3–15.9)
RBC.: 5.66 MIL/uL — ABNORMAL HIGH (ref 3.87–5.11)
Retic Count, Absolute: 102.4 10*3/uL (ref 19.0–186.0)
Retic Ct Pct: 1.8 % (ref 0.4–3.1)
Reticulocyte Hemoglobin: 26.3 pg — ABNORMAL LOW (ref >27.9)

## 2020-05-16 LAB — CBC WITH DIFFERENTIAL (CANCER CENTER ONLY)
Abs Immature Granulocytes: 0.01 10*3/uL (ref 0.00–0.07)
Basophils Absolute: 0 10*3/uL (ref 0.0–0.1)
Basophils Relative: 0 %
Eosinophils Absolute: 0 10*3/uL (ref 0.0–0.5)
Eosinophils Relative: 0 %
HCT: 41.3 % (ref 36.0–46.0)
Hemoglobin: 12.5 g/dL (ref 12.0–15.0)
Immature Granulocytes: 0 %
Lymphocytes Relative: 33 %
Lymphs Abs: 2.5 10*3/uL (ref 0.7–4.0)
MCH: 21.3 pg — ABNORMAL LOW (ref 26.0–34.0)
MCHC: 30.3 g/dL (ref 30.0–36.0)
MCV: 70.2 fL — ABNORMAL LOW (ref 80.0–100.0)
Monocytes Absolute: 0.5 10*3/uL (ref 0.1–1.0)
Monocytes Relative: 6 %
Neutro Abs: 4.6 10*3/uL (ref 1.7–7.7)
Neutrophils Relative %: 61 %
Platelet Count: 299 10*3/uL (ref 150–400)
RBC: 5.88 MIL/uL — ABNORMAL HIGH (ref 3.87–5.11)
RDW: 15.9 % — ABNORMAL HIGH (ref 11.5–15.5)
WBC Count: 7.6 10*3/uL (ref 4.0–10.5)
nRBC: 0 % (ref 0.0–0.2)

## 2020-05-16 LAB — LACTATE DEHYDROGENASE: LDH: 225 U/L — ABNORMAL HIGH (ref 98–192)

## 2020-05-16 LAB — IRON AND TIBC
Iron: 36 ug/dL — ABNORMAL LOW (ref 41–142)
Saturation Ratios: 8 % — ABNORMAL LOW (ref 21–57)
TIBC: 475 ug/dL — ABNORMAL HIGH (ref 236–444)
UIBC: 439 ug/dL — ABNORMAL HIGH (ref 120–384)

## 2020-05-16 LAB — FERRITIN: Ferritin: 56 ng/mL (ref 11–307)

## 2020-05-16 NOTE — Progress Notes (Signed)
[place] [phone]) [phone]   Fax:([phone]) [place] NOTE  Patient Care Team: Dorothyann Peng, NP as PCP - General (Family Medicine)  Hematological/Oncological History # Alpha Thalassemia Trait  CHIEF COMPLAINTS/PURPOSE OF CONSULTATION:  "Alpha Thalassemia Trait "  HISTORY OF PRESENTING ILLNESS:  Maureen Lewis 24 y.o. female with medical history significant for obesity, OSA without CPAP, IBS, and depression who presents for evaluation of alpha thalassemia.   On review of the previous records Maureen Lewis has a longstanding history of microcytosis (MCV 60-70) and mild anemia.  On her most recent check on 04/09/2020 the patient was found to have white blood cell count of 6.6, hemoglobin of 12.7, MCV of 68, and a platelet count of 318.  She was also noted to have elevated red blood cell count at 5.88.  The patient currently carries a diagnosis of alpha thalassemia, confirmed with a hemoglobin electrophoresis performed on 04/06/2019 which showed the patient had alpha thalassemia trait..  Due to concern for this patient's history of alpha thalassemia she was referred to hematology for further evaluation and management.  On exam today Maureen Lewis notes that she was first diagnosed with thalassemia last year when she became pregnant.  She reports that after delivery of her healthy baby boy she began taking iron pills in December 2020.  She reports of the iron pills have been well-tolerated without any issues of stomach pain or constipation.  She notes that the iron pills do cause dark bowel movements but that she has not had any other overt signs of bleeding such as nosebleeds, bruising, or easy bleeding.  The patient does endorse that prior to becoming pregnant she had quite heavy menstrual cycles that were irregular.  She notes that a.  Last up to 2 weeks and that she could go through 20 pads in a day and that they were completely soaked.  She notes that her OB/GYN has  started her on birth control pills in order to help control these periods.  On further discussion the patient notes that her mother had a history of sickle cell trait and that her maternal grandfather had a sickle cell disease.  She reports that the father side of the history is not well known.  She does note that her maternal grandmother has a history of hypertension and diabetes.  She notes that her child is currently 78 months old and is quite healthy.  Otherwise she denies being a smoker, drinking alcohol.  She notes that she does have decreased energy and does feel tired and exhausted a lot.  She otherwise denies having any problems with fevers, chills, sweats, nausea, vomiting or diarrhea.  A full 10 point ROS is listed below.  MEDICAL HISTORY:  Past Medical History:  Diagnosis Date  . Abnormal uterine bleeding   . Acid reflux   . Alpha thalassemia trait   . Amenorrhea   . Anemia    no current med.  . Back pain   . Cesarean delivery delivered 10/11/2019   10/09/2019 - primary CS for failed IOL  . Chest pain   . Complication of anesthesia    states had to keep giving her anesthesia during EGD  . Constipation   . Depression    "I'm good"  . Finger mass, left 03/2017   middle finger  . Gestational diabetes   . Gestational diabetes 05/23/2019   Current Diabetic Medications:  None  '[ ]'$  Aspirin 81 mg daily after 12 weeks (? A2/B GDM)  Required  Referrals for A1GDM or A2GDM: '[ ]'$  Diabetes Education and Testing Supplies '[ ]'$  Nutrition Cousult  For A2/B GDM or higher classes of DM '[ ]'$  Diabetes Education and Testing Supplies '[ ]'$  Nutrition Counsult '[ ]'$  Fetal ECHO after 20 weeks  '[ ]'$  Eye exam for retina evaluation   Baseline and surveillance labs (  . Gestational hypertension 09/23/2019   Guidelines for Antenatal Testing and Sonography  (with updated ICD-10 codes)  Updated  2019-09-18 with Dr. Tama High  INDICATION U/S 2 X week NST/AFI  or full BPP wkly DELIVERY Diabetes   A1 - good control -  O24.410    A2 - good control - O24.419      A2  - poor control or poor compliance - O24.419, E11.65   (Macrosomia or polyhydramnios) **E11.65 is extra code for poor control**    A2/B - O24.  Marland Kitchen Headache   . Hypertension   . Infection    UTI  . Irritable bowel syndrome (IBS)   . Joint pain   . Morbid obesity with body mass index (BMI) of 45.0 to 49.9 in adult [facility])   . Obesity during pregnancy, antepartum 04/06/2019   Body mass index is 53.71 kg/m.  Recommendations '[x]'$  Aspirin 81 mg daily after 12 weeks; discontinue after 36 weeks '[ ]'$  Nutrition consult '[ ]'$  Weight gain 11-20 lbs for singleton and 25-35 lbs for twin pregnancy (IOM guidelines) Higher class of obesity patients recommended to gain closer to lower limit  Weight loss is associated with adverse outcomes '[ ]'$  Baseline and surveillance labs (pulled in fr  . Plantar fasciitis, bilateral   . Polycystic ovary syndrome   . Prediabetes    "prediabetes"  . Pregnancy induced hypertension   . Shortness of breath   . Sleep apnea    no CPAP use  . Supervision of normal first pregnancy 04/06/2019   [name]: [ x]Initial [ x]12 '[ ]'$ 20 '[ ]'$ 28 '[ ]'$ 32 '[ ]'$ 36 '[ ]'$ 38 '[ ]'$ 39 '[ ]'$ 40 Nursing Staff Provider Office Location [facility]  Dating   LMP Language  English  Anatomy US  Nml Flu Vaccine  Declined 07/28/19 Genetic Screen  NIPS: low risks   AFP:   negative  TDaP vaccine   08/25/19 Hgb A1C or  GTT Early A1C 5.8 Third trimester: GDM insulin Rhogam  NA   LAB RESULTS  Feeding Plan Breast Blood Type  . Vertigo 2017    SURGICAL HISTORY: Past Surgical History:  Procedure Laterality Date  . CESAREAN SECTION N/A 10/09/2019   Procedure: CESAREAN SECTION;  Surgeon: Aletha Halim, MD;  Location: [facility];  Service: Obstetrics;  Laterality: N/A;  . COLONOSCOPY WITH PROPOFOL  10/07/2016  . ESOPHAGOGASTRODUODENOSCOPY  12/21/2015  . EXCISION MASS UPPER EXTREMETIES Left 04/21/2017   Procedure: EXCISION MASS LEFT MIDDLE FINGER;  Surgeon: Daryll Brod, MD;   Location: [place];  Service: Orthopedics;  Laterality: Left;  . IR FL GUIDED LOC OF NEEDLE/CATH TIP FOR SPINAL INJECTION RT  03/13/2020    SOCIAL HISTORY: Social History   Socioeconomic History  . Marital status: Single    Spouse name: Not on file  . Number of children: Not on file  . Years of education: Not on file  . Highest education level: Not on file  Occupational History  . Occupation: Chemical engineer: [employer]  Tobacco Use  . Smoking status: Never Smoker  . Smokeless tobacco: Never Used  Vaping Use  . Vaping Use: Never used  Substance and Sexual Activity  . Alcohol use: Not Currently    Comment: social   . Drug use: No  . Sexual activity: Not Currently    Birth control/protection: None, Injection  Other Topics Concern  . Not on file  Social History Narrative   Right handed    Soda sometimes   Lives with mom and cousin, a grandmother in [place] also helps care for her.       She is in nursing school.    Social Determinants of Health   Financial Resource Strain:   . Difficulty of Paying Living Expenses:   Food Insecurity:   . Worried About Charity fundraiser in the Last Year:   . Arboriculturist in the Last Year:   Transportation Needs:   . Film/video editor (Medical):   Marland Kitchen Lack of Transportation (Non-Medical):   Physical Activity:   . Days of Exercise per Week:   . Minutes of Exercise per Session:   Stress:   . Feeling of Stress :   Social Connections:   . Frequency of Communication with Friends and Family:   . Frequency of Social Gatherings with Friends and Family:   . Attends Religious Services:   . Active Member of Clubs or Organizations:   . Attends Archivist Meetings:   Marland Kitchen Marital Status:   Intimate Partner Violence:   . Fear of Current or Ex-Partner:   . Emotionally Abused:   Marland Kitchen Physically Abused:   . Sexually Abused:     FAMILY HISTORY: Family History  Problem Relation Age of Onset  .  Diabetes Maternal Aunt   . Hypertension Maternal Uncle   . Depression Maternal Grandfather   . Diabetes Maternal Grandfather   . Hypertension Maternal Grandfather   . Arthritis Mother   . High blood pressure Mother   . Depression Mother   . Sleep apnea Mother   . Obesity Mother   . Diabetes Mother   . Hypertension Mother   . Sleep apnea Father   . Obesity Father     ALLERGIES:  is allergic to bee pollen, bee venom, hydrocodone-acetaminophen, peach flavor, and pollen extract.  MEDICATIONS:  Current Outpatient Medications  Medication Sig Dispense Refill  . amLODipine (NORVASC) 10 MG tablet Take 1 tablet (10 mg total) by mouth daily. 30 tablet 2  . baclofen (LIORESAL) 10 MG tablet Take 1 tablet (10 mg total) by mouth 2 (two) times daily. 30 each 0  . Blood Pressure Monitoring KIT 1 kit by Does not apply route once a week. 1 kit 0  . ferrous sulfate 325 (65 FE) MG tablet Take 1 tablet (325 mg total) by mouth every other day. 45 tablet 1  . hydrochlorothiazide (HYDRODIURIL) 25 MG tablet Take 1 tablet (25 mg total) by mouth daily. 30 tablet 2  . ibuprofen (ADVIL) 800 MG tablet Take 1 tablet (800 mg total) by mouth every 8 (eight) hours as needed. 30 tablet 5  . loratadine (CLARITIN) 10 MG tablet Take 10 mg by mouth daily as needed for allergies.     . medroxyPROGESTERone (DEPO-PROVERA) 150 MG/ML injection Inject 1 mL (150 mg total) into the muscle every 3 (three) months. 1 mL 3  . ondansetron (ZOFRAN-ODT) 4 MG disintegrating tablet Take 4 mg by mouth every 8 (eight) hours as needed.     . pantoprazole (PROTONIX) 40 MG tablet Take 1 tablet (40 mg total) by mouth daily. 30 tablet 3  . polyethylene glycol powder (GLYCOLAX/MIRALAX) 17  GM/SCOOP powder     . Semaglutide-Weight Management (WEGOVY) 0.25 MG/0.5ML SOAJ Inject 0.25 mg into the skin once a week. 2.24 mL 0  . sucralfate (CARAFATE) 1 GM/10ML suspension Take 10 mLs (1 g total) by mouth 4 (four) times daily -  with meals and at bedtime.  420 mL 0  . topiramate (TOPAMAX) 25 MG tablet Take 2 tablets (50 mg total) by mouth 2 (two) times daily. 180 tablet 1  . Vitamin D, Ergocalciferol, (DRISDOL) 1.25 MG (50000 UNIT) CAPS capsule Take 1 capsule (50,000 Units total) by mouth every 7 (seven) days. 4 capsule 0   No current facility-administered medications for this visit.    REVIEW OF SYSTEMS:   Constitutional: ( - ) fevers, ( - )  chills , ( - ) night sweats Eyes: ( - ) blurriness of vision, ( - ) double vision, ( - ) watery eyes Ears, nose, mouth, throat, and face: ( - ) mucositis, ( - ) sore throat Respiratory: ( - ) cough, ( - ) dyspnea, ( - ) wheezes Cardiovascular: ( - ) palpitation, ( - ) chest discomfort, ( - ) lower extremity swelling Gastrointestinal:  ( - ) nausea, ( - ) heartburn, ( - ) change in bowel habits Skin: ( - ) abnormal skin rashes Lymphatics: ( - ) new lymphadenopathy, ( - ) easy bruising Neurological: ( - ) numbness, ( - ) tingling, ( - ) new weaknesses Behavioral/Psych: ( - ) mood change, ( - ) new changes  All other systems were reviewed with the patient and are negative.  PHYSICAL EXAMINATION: ECOG PERFORMANCE STATUS: 1 - Symptomatic but completely ambulatory  Vitals:   05/16/20 1330  BP: (!) 128/98  Pulse: 103  Resp: 18  Temp: 98.7 F (37.1 C)  SpO2: 97%   Filed Weights   05/16/20 1330  Weight: (!) 443 lb 4.8 oz (201.1 kg)    GENERAL: well appearing young obese African American female in NAD  SKIN: skin color, texture, turgor are normal, no rashes or significant lesions EYES: conjunctiva are pink and non-injected, sclera clear LUNGS: clear to auscultation and percussion with normal breathing effort HEART: regular rate & rhythm and no murmurs and no lower extremity edema ABDOMEN: exam limited 2/2 to body habitus Musculoskeletal: no cyanosis of digits and no clubbing  PSYCH: alert & oriented x 3, fluent speech NEURO: no focal motor/sensory deficits  LABORATORY DATA:  I have reviewed  the data as listed CBC Latest Ref Rng & Units 05/16/2020 04/09/2020 02/21/2020  WBC 4.0 - 10.5 K/uL 7.6 6.6 6.5  Hemoglobin 12.0 - 15.0 g/dL 12.5 12.7 11.7(L)  Hematocrit 36 - 46 % 41.3 39.8 37.3  Platelets 150 - 400 K/uL 299 318 287.0    CMP Latest Ref Rng & Units 05/16/2020 05/04/2020 04/09/2020  Glucose 70 - 99 mg/dL 89 82 96  BUN 6 - 20 mg/dL '11 10 11  '$ Creatinine 0.44 - 1.00 mg/dL 0.79 0.60 0.60  Sodium 135 - 145 mmol/L 139 140 140  Potassium 3.5 - 5.1 mmol/L 3.8 4.2 4.8  Chloride 98 - 111 mmol/L 107 107 104  CO2 22 - 32 mmol/L 23 21 19(L)  Calcium 8.9 - 10.3 mg/dL 10.5(H) 9.4 9.9  Total Protein 6.5 - 8.1 g/dL 8.4(H) - 7.9  Total Bilirubin 0.3 - 1.2 mg/dL 0.7 - 0.4  Alkaline Phos 38 - 126 U/L 100 - 124(H)  AST 15 - 41 U/L 16 - 17  ALT 0 - 44 U/L 17 - 19  RADIOGRAPHIC STUDIES:  CT Abdomen Pelvis Wo Contrast  Result Date: 05/11/2020 CLINICAL DATA:  Generalized GI motility disorder suspected. Question ulcer. Black stools for 1 month. EXAM: CT ABDOMEN AND PELVIS WITHOUT CONTRAST TECHNIQUE: Multidetector CT imaging of the abdomen and pelvis was performed following the standard protocol without IV contrast. COMPARISON:  09/13/2016 FINDINGS: Lower chest: Lung bases are clear. No effusions. Heart is normal size. Hepatobiliary: No focal hepatic abnormality. Gallbladder unremarkable. Pancreas: No focal abnormality or ductal dilatation. Spleen: No focal abnormality.  Normal size. Adrenals/Urinary Tract: No adrenal abnormality. No focal renal abnormality. No stones or hydronephrosis. Urinary bladder is unremarkable. Stomach/Bowel: Stomach, large and small bowel grossly unremarkable. Appendix normal. Vascular/Lymphatic: No evidence of aneurysm or adenopathy. Reproductive: Uterus and adnexa unremarkable.  No mass. Other: No free fluid or free air. Musculoskeletal: No acute bony abnormality. IMPRESSION: No acute findings or significant abnormality seen in the abdomen or pelvis. Electronically Signed    By: Rolm Baptise M.D.   On: 05/11/2020 18:11   US PELVIC COMPLETE WITH TRANSVAGINAL  Result Date: 04/18/2020 CLINICAL DATA:  Pelvic pain at for 1 month, LMP 12/18/2019, history of Caesarean section, on Depo Provera EXAM: TRANSABDOMINAL AND TRANSVAGINAL ULTRASOUND OF PELVIS TECHNIQUE: Both transabdominal and transvaginal ultrasound examinations of the pelvis were performed. Transabdominal technique was performed for global imaging of the pelvis including uterus, ovaries, adnexal regions, and pelvic cul-de-sac. It was necessary to proceed with endovaginal exam following the transabdominal exam to visualize the endometrium and ovaries. COMPARISON:  None FINDINGS: Uterus Measurements: 9.7 x 4.4 x 5.3 cm = volume: 120 mL. Anteverted. Normal morphology without mass Endometrium Thickness: 10 mm. No endometrial fluid or focal abnormality. Tiny amount of nonspecific endocervical canal fluid is noted. Right ovary Not visualized, likely obscured by bowel Left ovary Not visualized, likely obscured by bowel Other findings Trace free pelvic fluid.  No adnexal masses. IMPRESSION: Nonvisualization of ovaries. Otherwise normal exam. Electronically Signed   By: Lavonia Dana M.D.   On: 04/18/2020 14:30    ASSESSMENT & PLAN Semiyah Casteneda 24 y.o. female with medical history significant for obesity, OSA without CPAP, IBS, and depression who presents for evaluation of alpha thalassemia.  After review the labs, review the records, discussion with the patient findings most consistent with alpha thalassemia trait.  After thalassemia trait is typically an asymptomatic condition which results in mild anemia and microcytosis.  The patient does have a longstanding history of microcytosis, but also has heavy bleeding history which implies that she may have current iron deficiency anemia and alpha thalassemia.  Today we discussed the incurable nature of this condition and the risk of passing it onto a child.  I also discussed the  importance of genetic counseling prior to consideration of having another child.  We will order her iron levels today to assure that she has adequate iron stores.  In the interim I would recommend that she continue taking her p.o. iron pills as prescribed.  As long as there is no indication for IV iron on her screening today I do not think there is any further need for follow-up in our clinic.  We are happy to be available in the event she were to develop new or worsening hematological abnormalities.  # Alpha Thalassemia Trait --findings are consistent with the patient being an asymptomatic carrier of Alpha thalassemia, which explains her longstanding microcytosis --will check iron levels and ferritin to assure she does not have a concurrent iron deficiency --no interventions are required for alpha thalassemia trait.  I would agree with genetic counseling prior to consideration of having children. --RTC PRN  Orders Placed This Encounter  Procedures  . CBC with Differential ([facility] Only)    Standing Status:   Future    Number of Occurrences:   1    Standing Expiration Date:   05/16/2021  . Retic Panel    Standing Status:   Future    Number of Occurrences:   1    Standing Expiration Date:   05/16/2021  . CMP ([place] only)    Standing Status:   Future    Number of Occurrences:   1    Standing Expiration Date:   05/16/2021  . Lactate dehydrogenase (LDH)    Standing Status:   Future    Number of Occurrences:   1    Standing Expiration Date:   05/16/2021  . Ferritin    Standing Status:   Future    Number of Occurrences:   1    Standing Expiration Date:   05/16/2021  . Iron and TIBC    Standing Status:   Future    Number of Occurrences:   1    Standing Expiration Date:   05/16/2021    All questions were answered. The patient knows to call the clinic with any problems, questions or concerns.  A total of more than 60 minutes were spent on this encounter and over half of that time  was spent on counseling and coordination of care as outlined above.   Ledell Peoples, MD Department of Hematology/Oncology [place] [facility] Phone: [phone] Pager: [phone] Email: [email or web address]  05/16/2020 11:07 PM

## 2020-05-17 ENCOUNTER — Telehealth: Payer: Self-pay | Admitting: *Deleted

## 2020-05-17 ENCOUNTER — Encounter: Payer: Self-pay | Admitting: Physical Therapy

## 2020-05-17 ENCOUNTER — Ambulatory Visit: Payer: BC Managed Care – PPO | Admitting: Physical Therapy

## 2020-05-17 DIAGNOSIS — G8929 Other chronic pain: Secondary | ICD-10-CM

## 2020-05-17 DIAGNOSIS — M545 Low back pain, unspecified: Secondary | ICD-10-CM

## 2020-05-17 DIAGNOSIS — R293 Abnormal posture: Secondary | ICD-10-CM

## 2020-05-17 DIAGNOSIS — M542 Cervicalgia: Secondary | ICD-10-CM | POA: Diagnosis not present

## 2020-05-17 NOTE — Telephone Encounter (Signed)
Received vm message from patient requesting explanation of lab results from 05/16/20. They are available in Epic now.  Please advise.

## 2020-05-17 NOTE — Therapy (Signed)
[place], [place], [address] Phone: [phone]   Fax:  [phone]  Physical Therapy Treatment  Patient Details  Name: Maureen Lewis MRN: [id number] Date of Birth: [birth year] Referring Provider (PT): Nafziger, Haynes Dage   Encounter Date: 05/17/2020   PT End of Session - 05/17/20 1637    Visit Number 8    Number of Visits 13    Date for PT Re-Evaluation 05/24/20    Authorization Type BCBS, MCD secondary resumitted on 04/16/2020    Authorization Time Period 05/01/2020 - 06/11/2020    Authorization - Visit Number 4    Authorization - Number of Visits 12    PT Start Time 1632    PT Stop Time 1710    PT Time Calculation (min) 38 min    Activity Tolerance Patient tolerated treatment well    Behavior During Therapy [facility] for tasks assessed/performed           Past Medical History:  Diagnosis Date  . Abnormal uterine bleeding   . Acid reflux   . Alpha thalassemia trait   . Amenorrhea   . Anemia    no current med.  . Back pain   . Cesarean delivery delivered 10/11/2019   10/09/2019 - primary CS for failed IOL  . Chest pain   . Complication of anesthesia    states had to keep giving her anesthesia during EGD  . Constipation   . Depression    "I'm good"  . Finger mass, left 03/2017   middle finger  . Gestational diabetes   . Gestational diabetes 05/23/2019   Current Diabetic Medications:  None  [ ]  Aspirin 81 mg daily after 12 weeks (? A2/B GDM)  Required Referrals for A1GDM or A2GDM: [ ]  Diabetes Education and Testing Supplies [ ]  Nutrition Cousult  For A2/B GDM or higher classes of DM [ ]  Diabetes Education and Testing Supplies [ ]  Nutrition Counsult [ ]  Fetal ECHO after 20 weeks  [ ]  Eye exam for retina evaluation   Baseline and surveillance labs (  . Gestational hypertension 09/23/2019   Guidelines for Antenatal Testing and Sonography  (with updated ICD-10 codes)  Updated  10-05-2019 with Dr. Tama High  INDICATION U/S 2 X week NST/AFI  or full BPP wkly DELIVERY Diabetes   A1 - good control - O24.410    A2 - good control - O24.419      A2  - poor control or poor compliance - O24.419, E11.65   (Macrosomia or polyhydramnios) **E11.65 is extra code for poor control**    A2/B - O24.  Marland Kitchen Headache   . Hypertension   . Infection    UTI  . Irritable bowel syndrome (IBS)   . Joint pain   . Morbid obesity with body mass index (BMI) of 45.0 to 49.9 in adult [facility])   . Obesity during pregnancy, antepartum 04/06/2019   Body mass index is 53.71 kg/m.  Recommendations [x]  Aspirin 81 mg daily after 12 weeks; discontinue after 36 weeks [ ]  Nutrition consult [ ]  Weight gain 11-20 lbs for singleton and 25-35 lbs for twin pregnancy (IOM guidelines) Higher class of obesity patients recommended to gain closer to lower limit  Weight loss is associated with adverse outcomes [ ]  Baseline and surveillance labs (pulled in fr  . Plantar fasciitis, bilateral   . Polycystic ovary syndrome   . Prediabetes    "prediabetes"  . Pregnancy induced hypertension   . Shortness of  breath   . Sleep apnea    no CPAP use  . Supervision of normal first pregnancy 04/06/2019   [name]: [ x]Initial [ x]12 [ ] 20 [ ] 28 [ ] 32 [ ] 36 [ ] 38 [ ] 39 [ ] 40 Nursing Staff Provider Office Location [facility]  Dating   LMP Language  English  Anatomy US  Nml Flu Vaccine  Declined 07/28/19 Genetic Screen  NIPS: low risks   AFP:   negative  TDaP vaccine   08/25/19 Hgb A1C or  GTT Early A1C 5.8 Third trimester: GDM insulin Rhogam  NA   LAB RESULTS  Feeding Plan Breast Blood Type  . Vertigo 2017    Past Surgical History:  Procedure Laterality Date  . CESAREAN SECTION N/A 10/09/2019   Procedure: CESAREAN SECTION;  Surgeon: Aletha Halim, MD;  [facility];  Service: Obstetrics;  Laterality: N/A;  . COLONOSCOPY WITH PROPOFOL  10/07/2016  . ESOPHAGOGASTRODUODENOSCOPY  12/21/2015  . EXCISION MASS UPPER EXTREMETIES Left 04/21/2017    Procedure: EXCISION MASS LEFT MIDDLE FINGER;  Surgeon: Daryll Brod, MD;  Location: [place];  Service: Orthopedics;  Laterality: Left;  . IR FL GUIDED LOC OF NEEDLE/CATH TIP FOR SPINAL INJECTION RT  03/13/2020    There were no vitals filed for this visit.   Subjective Assessment - 05/17/20 1636    Subjective "I worked today for 5 hours and surprisingly I have no pain."    Currently in Pain? No/denies    Pain Onset More than a month ago    Pain Frequency Intermittent    Aggravating Factors  prolonged standing/ walking    Pain Relieving Factors ice, muscle relaxer, pain    Pain Score 0    Pain Onset More than a month ago    Pain Frequency Intermittent    Aggravating Factors  doing too much    Pain Relieving Factors ice, medication,              [facility] PT Assessment - 05/17/20 0001      Assessment   Medical Diagnosis Chest pain, unspecified type R07.9, Cervical radiculopathy M54.12    Referring Provider (PT) Nafziger, Haynes Dage                         [facility] Adult PT Treatment/Exercise - 05/17/20 0001      Neck Exercises: Prone   Other Prone Exercise quadruped I's, Ys and T's 1 x 10 ea. bil      Lumbar Exercises: Aerobic   Tread Mill L 1.0 x 6 min       Lumbar Exercises: Standing   Heel Raises 20 reps   x 2 sets     Knee/Hip Exercises: Standing   Hip Abduction 15 reps;Knee straight;Stengthening;Both;2 sets   with green theraband   Hip Extension 2 sets;15 reps;Stengthening;Both;Knee straight   with green theraband     Neck Exercises: Stretches   Upper Trapezius Stretch 1 rep;30 seconds    Levator Stretch 1 rep;30 seconds                  PT Education - 05/17/20 1709    Education Details Updated HEP for standing hip strengthening and heel raises    Person(s) Educated Patient    Methods Explanation;Verbal cues    Comprehension Verbalized understanding;Verbal cues required            PT Short Term Goals - 04/16/20 1152       PT  SHORT TERM GOAL #1   Title pt to be I with inital HEP    Period Weeks    Status Achieved      PT SHORT TERM GOAL #2   Title pt to verbalize and demo efficient posture and lifting mechanics    Period Weeks    Status Achieved             PT Long Term Goals - 04/16/20 1153      PT LONG TERM GOAL #1   Title pt to maintain cervical ROM with </= 1/10 pain for functional ROM required for ADLS    Baseline functional ROM in all planes but noted 6-7/10 pain    Period Weeks    Status On-going    Target Date 05/28/20      PT LONG TERM GOAL #2   Title pt to maintain bil shoudler strenght with no report of pain for functional lifting/ carrying activitis for work and ADLs    Baseline functional strength but 6-7/10 poain during testing of flexion/ abduction    Period Weeks    Target Date 05/28/20      PT LONG TERM GOAL #3   Title pt to rpeort decreased spasm in bil upper trap and surrounding musculature to promote neck ROM with </= 2/10 pain    Baseline spasm noted in bil upper trap/ levator scapulae with 6-7/10 pain    Period Weeks    Target Date 05/28/20      PT LONG TERM GOAL #4   Title pt to be I with all HEP given to maintain and progress current level of function    Baseline independent with current HEP    Time 6    Period Weeks    Status On-going    Target Date 05/28/20      PT LONG TERM GOAL #5   Title pt to maintain and current trunk ROM with </= 2/10 pain for improvement of condition    Baseline pain at 8/10 in all ranges    Time 6    Period Weeks    Status On-going    Target Date 05/28/20      PT LONG TERM GOAL #6   Title pt to be able to sit, stand and walk for >/= 1 hour with </= 2/10 at max pain in the back for functional endurance required for work related activities.    Baseline max pain is 10/10 with standing/ walking    Period Weeks    Status On-going                 Plan - 05/17/20 1710    Clinical Impression Statement Maureen Lewis continues to make  great progress with physical therapy noting no pain today follwing a 5 hour shift. Continued working on hip / knee strengthening, as well as shoulder stability. she responded well to all exercises reporting no pain.    PT Treatment/Interventions ADLs/Self Care Home Management;Electrical Stimulation;Iontophoresis 4mg /ml Dexamethasone;Dry needling;Moist Heat;Ultrasound;Therapeutic activities;Therapeutic exercise;Balance training;Neuromuscular re-education;Manual techniques;Passive range of motion;Patient/family education;Taping    PT Next Visit Plan response to DN PRN, posture enducation, posterior shoulder strengthening,  hip/ core strengthening, work on endurance training.    PT Home Exercise Plan [email or web address] - upper trap stretch, levator scapuale stretch, chin tuck (supine), scapular retraction. lower trunk rotation, supine marching, low back stretch, clamshells, rows, shoulder extension; standing hip abduction/ extension and heel raise. Access Code: [email or web address] .Shoulder extension with resistance .Scapular Retraction with Resistance Hooklying Clamshell with Resistance ,Seated Isometric  Hip Adduction with Diona Foley    Consulted and Agree with Plan of Care Patient           Patient will benefit from skilled therapeutic intervention in order to improve the following deficits and impairments:  Obesity, Improper body mechanics, Increased muscle spasms, Decreased strength, Postural dysfunction, Pain, Decreased activity tolerance, Decreased endurance, Decreased range of motion  Visit Diagnosis: Cervicalgia  Abnormal posture  Chronic bilateral low back pain, unspecified whether sciatica present     Problem List Patient Active Problem List   Diagnosis Date Noted  . Hx of migraine headaches 04/09/2020  . Postpartum care following cesarean delivery 11/22/2019  . Contraceptive management 11/22/2019  . Positive RPR test 10/09/2019  . Migraine headache 07/12/2019  . Alpha thalassemia trait   . Vitamin D  deficiency 09/16/2017  . Prediabetes 09/16/2017  . Depression 09/16/2017  . Class 3 severe obesity with serious comorbidity and body mass index (BMI) of 50.0 to 59.9 in adult ([place]) 09/16/2017  . PCOS (polycystic ovarian syndrome) 08/13/2017  . Mass 05/06/2017  . Vertigo 10/06/2016  . Anemia 09/26/2016  . Pseudotumor cerebri syndrome 08/28/2016  . Obesity hypoventilation syndrome ([place]) 08/28/2016  . Benign paroxysmal positional vertigo due to bilateral vestibular disorder 08/28/2016  . OSA (obstructive sleep apnea) 08/28/2016  . Hypersomnia with sleep apnea 08/28/2016  . Female hirsutism 08/28/2016  . GERD (gastroesophageal reflux disease) 02/24/2015  . Constipation 10/02/2014   Starr Lake PT, DPT, LAT, ATC  05/17/20  5:17 PM      [facility] [address] [place], [place], [address] Phone: [phone]   Fax:  [phone]  Name: Maureen Lewis MRN: [id number] Date of Birth: 1996/01/26

## 2020-05-18 ENCOUNTER — Telehealth: Payer: Self-pay | Admitting: *Deleted

## 2020-05-18 NOTE — Telephone Encounter (Signed)
-----   Message from Orson Slick, MD sent at 05/17/2020  7:54 PM EDT ----- Please let Mrs. Eltzroth know that her blood findings are consistent with iron deficiency WITHOUT anemia. Please call in ferrous sulfate 325mg  PO daily with a source of vitamin C to a pharmacy of her choice. This will help to replete her iron stores and prevent her from becoming anemic in the future. Her repeat iron studies can be checked by her PCP or we can have her return to our clinic in 6 months time (no f/u current scheduled).  ----- Message ----- From: Buel Ream, Lab In [place] Sent: 05/16/2020   2:44 PM EDT To: Orson Slick, MD

## 2020-05-18 NOTE — Telephone Encounter (Signed)
TCT patient regarding lab results. Spoke with patient and advised that she does have iron deficiency but without anemia. She is to continue her iron replacement tabs daily along with a source of Vitamin C. Advised that she can have her iron levels checked by her PCP in the future. No need to follow up her at our clinic. She voiced understanding.

## 2020-05-21 ENCOUNTER — Ambulatory Visit: Payer: BC Managed Care – PPO | Attending: Adult Health | Admitting: Physical Therapy

## 2020-05-21 ENCOUNTER — Other Ambulatory Visit: Payer: Self-pay

## 2020-05-21 ENCOUNTER — Encounter: Payer: Self-pay | Admitting: Physical Therapy

## 2020-05-21 DIAGNOSIS — R293 Abnormal posture: Secondary | ICD-10-CM

## 2020-05-21 DIAGNOSIS — G8929 Other chronic pain: Secondary | ICD-10-CM

## 2020-05-21 DIAGNOSIS — M545 Low back pain, unspecified: Secondary | ICD-10-CM

## 2020-05-21 DIAGNOSIS — M542 Cervicalgia: Secondary | ICD-10-CM

## 2020-05-21 NOTE — Therapy (Signed)
[place], [place], [address] Phone: [phone]   Fax:  [phone]  Physical Therapy Treatment  Patient Details  Name: Maureen Lewis MRN: [id number] Date of Birth: March 22, 1996 Referring Provider (PT): Nafziger, Haynes Dage   Encounter Date: 05/21/2020   PT End of Session - 05/21/20 1535    Visit Number 9    Number of Visits 13    Date for PT Re-Evaluation 05/24/20    Authorization Type BCBS, MCD secondary resumitted on 04/16/2020    Authorization Time Period 05/01/2020 - 06/11/2020    Authorization - Visit Number 8    Authorization - Number of Visits 12    PT Start Time 1501    PT Stop Time 1541    PT Time Calculation (min) 40 min    Activity Tolerance Patient tolerated treatment well    Behavior During Therapy [facility] for tasks assessed/performed           Past Medical History:  Diagnosis Date  . Abnormal uterine bleeding   . Acid reflux   . Alpha thalassemia trait   . Amenorrhea   . Anemia    no current med.  . Back pain   . Cesarean delivery delivered 10/11/2019   10/09/2019 - primary CS for failed IOL  . Chest pain   . Complication of anesthesia    states had to keep giving her anesthesia during EGD  . Constipation   . Depression    "I'm good"  . Finger mass, left 03/2017   middle finger  . Gestational diabetes   . Gestational diabetes 05/23/2019   Current Diabetic Medications:  None  [ ]  Aspirin 81 mg daily after 12 weeks (? A2/B GDM)  Required Referrals for A1GDM or A2GDM: [ ]  Diabetes Education and Testing Supplies [ ]  Nutrition Cousult  For A2/B GDM or higher classes of DM [ ]  Diabetes Education and Testing Supplies [ ]  Nutrition Counsult [ ]  Fetal ECHO after 20 weeks  [ ]  Eye exam for retina evaluation   Baseline and surveillance labs (  . Gestational hypertension 09/23/2019   Guidelines for Antenatal Testing and Sonography  (with updated ICD-10 codes)  Updated  Sep 22, 2019 with Dr. Tama High  INDICATION U/S 2 X week NST/AFI  or full BPP wkly DELIVERY Diabetes   A1 - good control - O24.410    A2 - good control - O24.419      A2  - poor control or poor compliance - O24.419, E11.65   (Macrosomia or polyhydramnios) **E11.65 is extra code for poor control**    A2/B - O24.  Marland Kitchen Headache   . Hypertension   . Infection    UTI  . Irritable bowel syndrome (IBS)   . Joint pain   . Morbid obesity with body mass index (BMI) of 45.0 to 49.9 in adult [facility])   . Obesity during pregnancy, antepartum 04/06/2019   Body mass index is 53.71 kg/m.  Recommendations [x]  Aspirin 81 mg daily after 12 weeks; discontinue after 36 weeks [ ]  Nutrition consult [ ]  Weight gain 11-20 lbs for singleton and 25-35 lbs for twin pregnancy (IOM guidelines) Higher class of obesity patients recommended to gain closer to lower limit  Weight loss is associated with adverse outcomes [ ]  Baseline and surveillance labs (pulled in fr  . Plantar fasciitis, bilateral   . Polycystic ovary syndrome   . Prediabetes    "prediabetes"  . Pregnancy induced hypertension   . Shortness of  breath   . Sleep apnea    no CPAP use  . Supervision of normal first pregnancy 04/06/2019   [name]: [ x]Initial [ x]12 [ ] 20 [ ] 28 [ ] 32 [ ] 36 [ ] 38 [ ] 39 [ ] 40 Nursing Staff Provider Office Location [facility]  Dating   LMP Language  English  Anatomy US  Nml Flu Vaccine  Declined 07/28/19 Genetic Screen  NIPS: low risks   AFP:   negative  TDaP vaccine   08/25/19 Hgb A1C or  GTT Early A1C 5.8 Third trimester: GDM insulin Rhogam  NA   LAB RESULTS  Feeding Plan Breast Blood Type  . Vertigo 2017    Past Surgical History:  Procedure Laterality Date  . CESAREAN SECTION N/A 10/09/2019   Procedure: CESAREAN SECTION;  Surgeon: Aletha Halim, MD;  [facility];  Service: Obstetrics;  Laterality: N/A;  . COLONOSCOPY WITH PROPOFOL  10/07/2016  . ESOPHAGOGASTRODUODENOSCOPY  12/21/2015  . EXCISION MASS UPPER EXTREMETIES Left 04/21/2017    Procedure: EXCISION MASS LEFT MIDDLE FINGER;  Surgeon: Daryll Brod, MD;  Location: [place];  Service: Orthopedics;  Laterality: Left;  . IR FL GUIDED LOC OF NEEDLE/CATH TIP FOR SPINAL INJECTION RT  03/13/2020    There were no vitals filed for this visit.   Subjective Assessment - 05/21/20 1504    Subjective "no pain today, didn't work today, I worked on Friday and did a 5 hour shift and no issues."    Diagnostic tests 02/21/2020 x-ray IMPRESSION:Negative cervical spine radiographs. head CT scan 03/09/2020 IMPRESSION:  Normal CT scan of the head without contrast    Currently in Pain? No/denies    Pain Orientation Right    Pain Type Chronic pain    Pain Onset More than a month ago    Pain Frequency Intermittent    Pain Score 0    Pain Location Back    Pain Type Chronic pain    Pain Onset More than a month ago    Pain Frequency Intermittent              [facility] PT Assessment - 05/21/20 0001      Assessment   Medical Diagnosis Chest pain, unspecified type R07.9, Cervical radiculopathy M54.12    Referring Provider (PT) Nafziger, Haynes Dage                         [facility] Adult PT Treatment/Exercise - 05/21/20 0001      Neck Exercises: Theraband   Horizontal ABduction Green   x 2 going to fatigue   Other Theraband Exercises bil shoulder flexion  2 x 15 pulling out on pilates ring, and 2 x 15 pushing in on pilates ring    Other Theraband Exercises scapular retraction with ER x 2 sets with green band going to fatigue      Lumbar Exercises: Aerobic   Tread Mill L1.2 x 6 min      Lumbar Exercises: Supine   Bridge 10 reps   x 2 sets with heels on green physioball    Bridge Limitations cue to tighten core      Knee/Hip Exercises: Standing   SLS with Vectors --      Knee/Hip Exercises: Seated   Sit to Sand 2 sets;15 reps   15# kettlebell     Knee/Hip Exercises: Sidelying   Hip ABduction Strengthening;2 sets;Both   going to fatigue bil  PT Short Term Goals - 04/16/20 1152      PT SHORT TERM GOAL #1   Title pt to be I with inital HEP    Period Weeks    Status Achieved      PT SHORT TERM GOAL #2   Title pt to verbalize and demo efficient posture and lifting mechanics    Period Weeks    Status Achieved             PT Long Term Goals - 04/16/20 1153      PT LONG TERM GOAL #1   Title pt to maintain cervical ROM with </= 1/10 pain for functional ROM required for ADLS    Baseline functional ROM in all planes but noted 6-7/10 pain    Period Weeks    Status On-going    Target Date 05/28/20      PT LONG TERM GOAL #2   Title pt to maintain bil shoudler strenght with no report of pain for functional lifting/ carrying activitis for work and ADLs    Baseline functional strength but 6-7/10 poain during testing of flexion/ abduction    Period Weeks    Target Date 05/28/20      PT LONG TERM GOAL #3   Title pt to rpeort decreased spasm in bil upper trap and surrounding musculature to promote neck ROM with </= 2/10 pain    Baseline spasm noted in bil upper trap/ levator scapulae with 6-7/10 pain    Period Weeks    Target Date 05/28/20      PT LONG TERM GOAL #4   Title pt to be I with all HEP given to maintain and progress current level of function    Baseline independent with current HEP    Time 6    Period Weeks    Status On-going    Target Date 05/28/20      PT LONG TERM GOAL #5   Title pt to maintain and current trunk ROM with </= 2/10 pain for improvement of condition    Baseline pain at 8/10 in all ranges    Time 6    Period Weeks    Status On-going    Target Date 05/28/20      PT LONG TERM GOAL #6   Title pt to be able to sit, stand and walk for >/= 1 hour with </= 2/10 at max pain in the back for functional endurance required for work related activities.    Baseline max pain is 10/10 with standing/ walking    Period Weeks    Status On-going                 Plan -  05/21/20 1521    Clinical Impression Statement pt continues to note not pain coming into treatment, and has been able to work 5 hours at work without issues. She continues to respond well to treatment and strengthening with empahsis on endurance training.    PT Treatment/Interventions ADLs/Self Care Home Management;Electrical Stimulation;Iontophoresis 4mg /ml Dexamethasone;Dry needling;Moist Heat;Ultrasound;Therapeutic activities;Therapeutic exercise;Balance training;Neuromuscular re-education;Manual techniques;Passive range of motion;Patient/family education;Taping    PT Next Visit Plan response to DN PRN, posture enducation, posterior shoulder strengthening,  hip/ core strengthening, work on endurance training.    PT Home Exercise Plan [email or web address] - upper trap stretch, levator scapuale stretch, chin tuck (supine), scapular retraction. lower trunk rotation, supine marching, low back stretch, clamshells, rows, shoulder extension; standing hip abduction/ extension and heel raise. Access Code: [email or web address] .Shoulder extension with resistance .Scapular  Retraction with Resistance Hooklying Clamshell with Resistance ,Seated Isometric Hip Adduction with Ball    Consulted and Agree with Plan of Care Patient           Patient will benefit from skilled therapeutic intervention in order to improve the following deficits and impairments:  Obesity, Improper body mechanics, Increased muscle spasms, Decreased strength, Postural dysfunction, Pain, Decreased activity tolerance, Decreased endurance, Decreased range of motion  Visit Diagnosis: Cervicalgia  Abnormal posture  Chronic bilateral low back pain, unspecified whether sciatica present     Problem List Patient Active Problem List   Diagnosis Date Noted  . Hx of migraine headaches 04/09/2020  . Postpartum care following cesarean delivery 11/22/2019  . Contraceptive management 11/22/2019  . Positive RPR test 10/09/2019  . Migraine headache 07/12/2019  .  Alpha thalassemia trait   . Vitamin D deficiency 09/16/2017  . Prediabetes 09/16/2017  . Depression 09/16/2017  . Class 3 severe obesity with serious comorbidity and body mass index (BMI) of 50.0 to 59.9 in adult ([place]) 09/16/2017  . PCOS (polycystic ovarian syndrome) 08/13/2017  . Mass 05/06/2017  . Vertigo 10/06/2016  . Anemia 09/26/2016  . Pseudotumor cerebri syndrome 08/28/2016  . Obesity hypoventilation syndrome ([place]) 08/28/2016  . Benign paroxysmal positional vertigo due to bilateral vestibular disorder 08/28/2016  . OSA (obstructive sleep apnea) 08/28/2016  . Hypersomnia with sleep apnea 08/28/2016  . Female hirsutism 08/28/2016  . GERD (gastroesophageal reflux disease) 02/24/2015  . Constipation 10/02/2014    Starr Lake PT, DPT, LAT, ATC  05/21/20  3:43 PM      [facility] [address] [place], [place], [address] Phone: [phone]   Fax:  [phone]  Name: Madelynne Lasker MRN: [id number] Date of Birth: [birth year]

## 2020-05-23 ENCOUNTER — Other Ambulatory Visit: Payer: Self-pay

## 2020-05-23 ENCOUNTER — Ambulatory Visit (INDEPENDENT_AMBULATORY_CARE_PROVIDER_SITE_OTHER): Payer: BC Managed Care – PPO | Admitting: Family Medicine

## 2020-05-23 ENCOUNTER — Encounter (INDEPENDENT_AMBULATORY_CARE_PROVIDER_SITE_OTHER): Payer: Self-pay | Admitting: Family Medicine

## 2020-05-23 VITALS — BP 116/76 | HR 84 | Temp 98.2°F | Ht 72.0 in | Wt >= 6400 oz

## 2020-05-23 DIAGNOSIS — Z6841 Body Mass Index (BMI) 40.0 and over, adult: Secondary | ICD-10-CM

## 2020-05-23 DIAGNOSIS — I10 Essential (primary) hypertension: Secondary | ICD-10-CM | POA: Diagnosis not present

## 2020-05-23 DIAGNOSIS — R7303 Prediabetes: Secondary | ICD-10-CM

## 2020-05-23 NOTE — Progress Notes (Signed)
Chief Complaint:   OBESITY Maureen Lewis is here to discuss her progress with her obesity treatment plan along with follow-up of her obesity related diagnoses. [name] is on the Category 3 Plan and states she is following her eating plan approximately 75-85% of the time. [name] states she is exercising for 0 minutes 0 times per week.  Today's visit was #: 4 Starting weight: 437 lbs Starting date: 04/09/2020 Today's weight: 440 lbs Today's date: 05/28/2020 Total lbs lost to date: 0 Total lbs lost since last in-office visit: 7 lbs  Interim History: [name] was started on [place] at her last office visit with Dr. Jearld Shines on 05/08/2020.  She says she is tolerating it well.  She does not eat all her meals of the day.  She says she forgets and is not hungry.  She has no cravings.  She says she only eats off plan for 2 days a week (chips, sweets, juice), but otherwise, she is eating on the plan but skips breakfast and lunch and rarely makes up for the food not eaten.  Subjective:   1. Prediabetes [name] has a diagnosis of prediabetes based on her elevated HgA1c and was informed this puts her at greater risk of developing diabetes. She continues to work on diet and exercise to decrease her risk of diabetes. She denies nausea or hypoglycemia.  She started [facility] at last visit and is tolerating it well without side effects.    Lab Results  Component Value Date   HGBA1C 6.0 (H) 04/09/2020   Lab Results  Component Value Date   INSULIN 36.8 (H) 04/09/2020   INSULIN 42.1 (H) 08/13/2017   2. Essential hypertension Review: taking medications as instructed, no medication side effects noted, no chest pain on exertion, no dyspnea on exertion, no swelling of ankles.  Blood pressures at home are running a little high at times, especially when she skips her medications.  Over the last couple of days, blood pressure has been running 125/80, 120s/70s.  BP Readings from Last 3 Encounters:  05/25/20 140/84  05/24/20 (!)  150/95  05/23/20 116/76   Assessment/Plan:   1. Prediabetes [name] will continue to work on weight loss, exercise, and decreasing simple carbohydrates to help decrease the risk of diabetes.  Continue [place], prudent nutritional plan, weight loss, eat proteins and all foods and do not skip meals.  2. Essential hypertension [name] is working on healthy weight loss and exercise to improve blood pressure control. We will watch for signs of hypotension as she continues her lifestyle modifications.  Well-controlled and at goal.  Continue medications per PCP and we will follow closely alongside them.  3. Class 3 severe obesity with serious comorbidity and body mass index (BMI) of 50.0 to 59.9 in adult, unspecified obesity type (HCC) [name] is currently in the action stage of change. As such, her goal is to continue with weight loss efforts. She has agreed to the Category 3 Plan.   Exercise goals: As is.  Behavioral modification strategies: increasing lean protein intake, decreasing simple carbohydrates, increasing water intake, no skipping meals, meal planning and cooking strategies and planning for success.  [name] has agreed to follow-up with our clinic in 2 weeks. She was informed of the importance of frequent follow-up visits to maximize her success with intensive lifestyle modifications for her multiple health conditions.   Objective:   Blood pressure 116/76, pulse 84, temperature 98.2 F (36.8 C), height 6' (1.829 m), weight (!) 440 lb (199.6 kg), SpO2 97 %,  not currently breastfeeding. Body mass index is 59.67 kg/m.  General: Cooperative, alert, well developed, in no acute distress. HEENT: Conjunctivae and lids unremarkable. Cardiovascular: Regular rhythm.  Lungs: Normal work of breathing. Neurologic: No focal deficits.   Lab Results  Component Value Date   CREATININE 0.79 05/16/2020   BUN 11 05/16/2020   NA 139 05/16/2020   K 3.8 05/16/2020   CL 107 05/16/2020   CO2 23 05/16/2020    Lab Results  Component Value Date   ALT 17 05/16/2020   AST 16 05/16/2020   ALKPHOS 100 05/16/2020   BILITOT 0.7 05/16/2020   Lab Results  Component Value Date   HGBA1C 6.0 (H) 04/09/2020   HGBA1C 5.8 (H) 04/06/2019   HGBA1C 6.0 04/27/2018   HGBA1C 5.8 (H) 08/13/2017   HGBA1C 5.9 03/08/2014   Lab Results  Component Value Date   INSULIN 36.8 (H) 04/09/2020   INSULIN 42.1 (H) 08/13/2017   Lab Results  Component Value Date   TSH 1.270 04/09/2020   Lab Results  Component Value Date   CHOL 156 04/09/2020   HDL 42 04/09/2020   LDLCALC 101 (H) 04/09/2020   TRIG 63 04/09/2020   CHOLHDL 3 04/27/2018   Lab Results  Component Value Date   WBC 7.6 05/16/2020   HGB 12.5 05/16/2020   HCT 41.3 05/16/2020   MCV 70.2 (L) 05/16/2020   PLT 299 05/16/2020   Lab Results  Component Value Date   IRON 36 (L) 05/16/2020   TIBC 475 (H) 05/16/2020   FERRITIN 56 05/16/2020   Attestation Statements:   Reviewed by clinician on day of visit: allergies, medications, problem list, medical history, surgical history, family history, social history, and previous encounter notes.  Time spent on visit including pre-visit chart review and post-visit care and charting was 25 minutes.   I, Water quality scientist, CMA, am acting as Location manager for [employer], DO.  I have reviewed the above documentation for accuracy and completeness, and I agree with the above. Mellody Dance, DO

## 2020-05-24 ENCOUNTER — Ambulatory Visit (HOSPITAL_COMMUNITY)
Admission: EM | Admit: 2020-05-24 | Discharge: 2020-05-24 | Disposition: A | Discharge status: Home / Self Care | Payer: BC Managed Care – PPO | Attending: Family Medicine | Admitting: Family Medicine

## 2020-05-24 ENCOUNTER — Other Ambulatory Visit: Payer: Self-pay

## 2020-05-24 DIAGNOSIS — Z20822 Contact with and (suspected) exposure to covid-19: Secondary | ICD-10-CM | POA: Insufficient documentation

## 2020-05-24 DIAGNOSIS — R05 Cough: Secondary | ICD-10-CM | POA: Insufficient documentation

## 2020-05-24 DIAGNOSIS — R059 Cough, unspecified: Secondary | ICD-10-CM

## 2020-05-24 LAB — SARS CORONAVIRUS 2 (TAT 6-24 HRS): SARS Coronavirus 2: NEGATIVE

## 2020-05-24 NOTE — ED Provider Notes (Signed)
[place]   [id number] 05/24/20 Arrival Time: 1443  ASSESSMENT & PLAN:  1. Cough   2. Exposure to COVID-19 virus      COVID-19 testing sent. See letter/work note on file for self-isolation guidelines. OTC symptom care as needed.   Follow-up Information    Dorothyann Peng, NP.   Specialty: Family Medicine Why: As needed. Contact information: [address] [place] [address] [phone]               Reviewed expectations re: course of current medical issues. Questions answered. Outlined signs and symptoms indicating need for more acute intervention. Understanding verbalized. After Visit Summary given.   SUBJECTIVE: History from: patient. Maureen Lewis is a 24 y.o. female who requests COVID-19 testing. Known COVID-19 contact: reports exposure yesterday. Recent travel: none. Reports: mild sore throat and cough for the past 2-3 d. Denies: difficulty breathing. Normal PO intake without n/v/d. Unvaccinated.   OBJECTIVE:  Vitals:   05/24/20 1143  BP: (!) 150/95  Pulse: 94  Resp: 16  Temp: 98.6 F (37 C)  SpO2: 98%    General appearance: alert; no distress Eyes: PERRLA; EOMI; conjunctiva normal HENT: [place]; AT; mild nasal congestion Neck: supple  Lungs: speaks full sentences without difficulty; unlabored Extremities: no edema Skin: warm and dry Neurologic: normal gait Psychological: alert and cooperative; normal mood and affect  Labs:  Labs Reviewed  SARS CORONAVIRUS 2 (TAT 6-24 HRS)      Allergies  Allergen Reactions  . Bee Pollen Hives, Shortness Of Breath and Swelling  . Bee Venom Hives, Shortness Of Breath and Swelling  . Hydrocodone-Acetaminophen Anaphylaxis    No problem when she takes Tylenol  . Peach Flavor Hives, Shortness Of Breath and Other (See Comments)    SWELLING OF MOUTH  . Pollen Extract Hives, Shortness Of Breath and Swelling    Past Medical History:  Diagnosis Date  . Abnormal uterine bleeding   .  Acid reflux   . Alpha thalassemia trait   . Amenorrhea   . Anemia    no current med.  . Back pain   . Cesarean delivery delivered 10/11/2019   10/09/2019 - primary CS for failed IOL  . Chest pain   . Complication of anesthesia    states had to keep giving her anesthesia during EGD  . Constipation   . Depression    "I'm good"  . Finger mass, left 03/2017   middle finger  . Gestational diabetes   . Gestational diabetes 05/23/2019   Current Diabetic Medications:  None  [ ]  Aspirin 81 mg daily after 12 weeks (? A2/B GDM)  Required Referrals for A1GDM or A2GDM: [ ]  Diabetes Education and Testing Supplies [ ]  Nutrition Cousult  For A2/B GDM or higher classes of DM [ ]  Diabetes Education and Testing Supplies [ ]  Nutrition Counsult [ ]  Fetal ECHO after 20 weeks  [ ]  Eye exam for retina evaluation   Baseline and surveillance labs (  . Gestational hypertension 09/23/2019   Guidelines for Antenatal Testing and Sonography  (with updated ICD-10 codes)  Updated  09-21-2019 with Dr. Tama High  INDICATION U/S 2 X week NST/AFI  or full BPP wkly DELIVERY Diabetes   A1 - good control - O24.410    A2 - good control - O24.419      A2  - poor control or poor compliance - O24.419, E11.65   (Macrosomia or polyhydramnios) **E11.65 is extra code for poor control**    A2/B -  O24.  . Headache   . Hypertension   . Infection    UTI  . Irritable bowel syndrome (IBS)   . Joint pain   . Morbid obesity with body mass index (BMI) of 45.0 to 49.9 in adult [facility])   . Obesity during pregnancy, antepartum 04/06/2019   Body mass index is 53.71 kg/m.  Recommendations [x]  Aspirin 81 mg daily after 12 weeks; discontinue after 36 weeks [ ]  Nutrition consult [ ]  Weight gain 11-20 lbs for singleton and 25-35 lbs for twin pregnancy (IOM guidelines) Higher class of obesity patients recommended to gain closer to lower limit  Weight loss is associated with adverse outcomes [ ]  Baseline and surveillance labs (pulled in fr  . Plantar  fasciitis, bilateral   . Polycystic ovary syndrome   . Prediabetes    "prediabetes"  . Pregnancy induced hypertension   . Shortness of breath   . Sleep apnea    no CPAP use  . Supervision of normal first pregnancy 04/06/2019   [name]: [ x]Initial [ x]12 [ ] 20 [ ] 28 [ ] 32 [ ] 36 [ ] 38 [ ] 39 [ ] 40 Nursing Staff Provider Office Location [facility]  Dating   LMP Language  English  Anatomy US  Nml Flu Vaccine  Declined 07/28/19 Genetic Screen  NIPS: low risks   AFP:   negative  TDaP vaccine   08/25/19 Hgb A1C or  GTT Early A1C 5.8 Third trimester: GDM insulin Rhogam  NA   LAB RESULTS  Feeding Plan Breast Blood Type  . Vertigo 2017   Social History   Socioeconomic History  . Marital status: Single    Spouse name: Not on file  . Number of children: Not on file  . Years of education: Not on file  . Highest education level: Not on file  Occupational History  . Occupation: Chemical engineer: [id number]  Tobacco Use  . Smoking status: Never Smoker  . Smokeless tobacco: Never Used  Vaping Use  . Vaping Use: Never used  Substance and Sexual Activity  . Alcohol use: Not Currently    Comment: social   . Drug use: No  . Sexual activity: Not Currently    Birth control/protection: None, Injection  Other Topics Concern  . Not on file  Social History Narrative   Right handed    Soda sometimes   Lives with mom and cousin, a grandmother in [place] also helps care for her.       She is in nursing school.    Social Determinants of Health   Financial Resource Strain:   . Difficulty of Paying Living Expenses:   Food Insecurity:   . Worried About Charity fundraiser in the Last Year:   . Arboriculturist in the Last Year:   Transportation Needs:   . Film/video editor (Medical):   Marland Kitchen Lack of Transportation (Non-Medical):   Physical Activity:   . Days of Exercise per Week:   . Minutes of Exercise per Session:   Stress:   . Feeling of Stress :   Social Connections:     . Frequency of Communication with Friends and Family:   . Frequency of Social Gatherings with Friends and Family:   . Attends Religious Services:   . Active Member of Clubs or Organizations:   . Attends Archivist Meetings:   Marland Kitchen Marital Status:   Intimate Partner Violence:   . Fear of Current or Ex-Partner:   . Emotionally  Abused:   Marland Kitchen Physically Abused:   . Sexually Abused:    Family History  Problem Relation Age of Onset  . Diabetes Maternal Aunt   . Hypertension Maternal Uncle   . Depression Maternal Grandfather   . Diabetes Maternal Grandfather   . Hypertension Maternal Grandfather   . Arthritis Mother   . High blood pressure Mother   . Depression Mother   . Sleep apnea Mother   . Obesity Mother   . Diabetes Mother   . Hypertension Mother   . Sleep apnea Father   . Obesity Father    Past Surgical History:  Procedure Laterality Date  . CESAREAN SECTION N/A 10/09/2019   Procedure: CESAREAN SECTION;  Surgeon: Aletha Halim, MD;  Location: [facility];  Service: Obstetrics;  Laterality: N/A;  . COLONOSCOPY WITH PROPOFOL  10/07/2016  . ESOPHAGOGASTRODUODENOSCOPY  12/21/2015  . EXCISION MASS UPPER EXTREMETIES Left 04/21/2017   Procedure: EXCISION MASS LEFT MIDDLE FINGER;  Surgeon: Daryll Brod, MD;  Location: [facility];  Service: Orthopedics;  Laterality: Left;  . IR FL GUIDED LOC OF NEEDLE/CATH TIP FOR SPINAL INJECTION RT  03/13/2020     Vanessa Kick, MD 05/24/20 1332

## 2020-05-24 NOTE — ED Triage Notes (Signed)
Pt c/o sore throat, cough and chest pain since Monday  Aunt positive for COVID, was exposed yesterday  Pt not vaccinated, requesting testing

## 2020-05-24 NOTE — Discharge Instructions (Addendum)
You have been tested for COVID-19 today. °If your test returns positive, you will receive a phone call from [facility] regarding your results. °Negative test results are not called. °Both positive and negative results area always visible on MyChart. °If you do not have a MyChart account, sign up instructions are provided in your discharge papers. °Please do not hesitate to contact us should you have questions or concerns. ° °

## 2020-05-25 ENCOUNTER — Encounter: Payer: Self-pay | Admitting: Adult Health

## 2020-05-25 ENCOUNTER — Ambulatory Visit (INDEPENDENT_AMBULATORY_CARE_PROVIDER_SITE_OTHER): Payer: BC Managed Care – PPO | Admitting: Adult Health

## 2020-05-25 VITALS — BP 140/84 | Temp 98.5°F | Wt >= 6400 oz

## 2020-05-25 DIAGNOSIS — J40 Bronchitis, not specified as acute or chronic: Secondary | ICD-10-CM | POA: Diagnosis not present

## 2020-05-25 MED ORDER — PREDNISONE 20 MG PO TABS
20.0000 mg | ORAL_TABLET | Freq: Every day | ORAL | 0 refills | Status: DC
Start: 2020-05-25 — End: 2020-05-25

## 2020-05-25 MED ORDER — PREDNISONE 20 MG PO TABS: 20.0000 mg | ORAL_TABLET | Freq: Every day | ORAL | 0 refills | Status: DC

## 2020-05-25 MED ORDER — BENZONATATE 200 MG PO CAPS: 200.0000 mg | ORAL_CAPSULE | Freq: Two times a day (BID) | ORAL | 0 refills | Status: DC | PRN

## 2020-05-25 NOTE — Progress Notes (Signed)
Subjective:    Patient ID: Maureen Lewis, female    DOB: 1996/08/17, 24 y.o.   MRN: [id number]  HPI  24 year old female who  has a past medical history of Abnormal uterine bleeding, Acid reflux, Alpha thalassemia trait, Amenorrhea, Anemia, Back pain, Cesarean delivery delivered (10/11/2019), Chest pain, Complication of anesthesia, Constipation, Depression, Finger mass, left (03/2017), Gestational diabetes, Gestational diabetes (05/23/2019), Gestational hypertension (09/23/2019), Headache, Hypertension, Infection, Irritable bowel syndrome (IBS), Joint pain, Morbid obesity with body mass index (BMI) of 45.0 to 49.9 in adult [facility]), Obesity during pregnancy, antepartum (04/06/2019), Plantar fasciitis, bilateral, Polycystic ovary syndrome, Prediabetes, Pregnancy induced hypertension, Shortness of breath, Sleep apnea, Supervision of normal first pregnancy (04/06/2019), and Vertigo (2017).  She presents to the office today for productive cough and shortness of breath that started about three days ago. She has been tested Monday and yesterday for Covid, both where negative. She feels as though she cannot take a deep breath. Has not noticed any wheezing. Denies fevers or chills.    Review of Systems See HPI   Past Medical History:  Diagnosis Date  . Abnormal uterine bleeding   . Acid reflux   . Alpha thalassemia trait   . Amenorrhea   . Anemia    no current med.  . Back pain   . Cesarean delivery delivered 10/11/2019   10/09/2019 - primary CS for failed IOL  . Chest pain   . Complication of anesthesia    states had to keep giving her anesthesia during EGD  . Constipation   . Depression    "I'm good"  . Finger mass, left 03/2017   middle finger  . Gestational diabetes   . Gestational diabetes 05/23/2019   Current Diabetic Medications:  None  '[ ]'$  Aspirin 81 mg daily after 12 weeks (? A2/B GDM)  Required Referrals for A1GDM or A2GDM: '[ ]'$  Diabetes Education and Testing Supplies '[ ]'$  Nutrition Cousult   For A2/B GDM or higher classes of DM '[ ]'$  Diabetes Education and Testing Supplies '[ ]'$  Nutrition Counsult '[ ]'$  Fetal ECHO after 20 weeks  '[ ]'$  Eye exam for retina evaluation   Baseline and surveillance labs (  . Gestational hypertension 09/23/2019   Guidelines for Antenatal Testing and Sonography  (with updated ICD-10 codes)  Updated  2019/09/27 with Dr. Tama High  INDICATION U/S 2 X week NST/AFI  or full BPP wkly DELIVERY Diabetes   A1 - good control - O24.410    A2 - good control - O24.419      A2  - poor control or poor compliance - O24.419, E11.65   (Macrosomia or polyhydramnios) **E11.65 is extra code for poor control**    A2/B - O24.  Marland Kitchen Headache   . Hypertension   . Infection    UTI  . Irritable bowel syndrome (IBS)   . Joint pain   . Morbid obesity with body mass index (BMI) of 45.0 to 49.9 in adult [facility])   . Obesity during pregnancy, antepartum 04/06/2019   Body mass index is 53.71 kg/m.  Recommendations '[x]'$  Aspirin 81 mg daily after 12 weeks; discontinue after 36 weeks '[ ]'$  Nutrition consult '[ ]'$  Weight gain 11-20 lbs for singleton and 25-35 lbs for twin pregnancy (IOM guidelines) Higher class of obesity patients recommended to gain closer to lower limit  Weight loss is associated with adverse outcomes '[ ]'$  Baseline and surveillance labs (pulled in fr  . Plantar fasciitis, bilateral   . Polycystic ovary syndrome   .  Prediabetes    "prediabetes"  . Pregnancy induced hypertension   . Shortness of breath   . Sleep apnea    no CPAP use  . Supervision of normal first pregnancy 04/06/2019   [name]: [ x]Initial [ x]12 '[ ]'$ 20 '[ ]'$ 28 '[ ]'$ 32 '[ ]'$ 36 '[ ]'$ 38 '[ ]'$ 39 '[ ]'$ 40 Nursing Staff Provider Office Location [facility]  Dating   LMP Language  English  Anatomy US  Nml Flu Vaccine  Declined 07/28/19 Genetic Screen  NIPS: low risks   AFP:   negative  TDaP vaccine   08/25/19 Hgb A1C or  GTT Early A1C 5.8 Third trimester: GDM insulin Rhogam  NA   LAB RESULTS  Feeding Plan Breast Blood Type  . Vertigo  2017    Social History   Socioeconomic History  . Marital status: Single    Spouse name: Not on file  . Number of children: Not on file  . Years of education: Not on file  . Highest education level: Not on file  Occupational History  . Occupation: Chemical engineer: [id number]  Tobacco Use  . Smoking status: Never Smoker  . Smokeless tobacco: Never Used  Vaping Use  . Vaping Use: Never used  Substance and Sexual Activity  . Alcohol use: Not Currently    Comment: social   . Drug use: No  . Sexual activity: Not Currently    Birth control/protection: None, Injection  Other Topics Concern  . Not on file  Social History Narrative   Right handed    Soda sometimes   Lives with mom and cousin, a grandmother in [place] also helps care for her.       She is in nursing school.    Social Determinants of Health   Financial Resource Strain:   . Difficulty of Paying Living Expenses:   Food Insecurity:   . Worried About Charity fundraiser in the Last Year:   . Arboriculturist in the Last Year:   Transportation Needs:   . Film/video editor (Medical):   Marland Kitchen Lack of Transportation (Non-Medical):   Physical Activity:   . Days of Exercise per Week:   . Minutes of Exercise per Session:   Stress:   . Feeling of Stress :   Social Connections:   . Frequency of Communication with Friends and Family:   . Frequency of Social Gatherings with Friends and Family:   . Attends Religious Services:   . Active Member of Clubs or Organizations:   . Attends Archivist Meetings:   Marland Kitchen Marital Status:   Intimate Partner Violence:   . Fear of Current or Ex-Partner:   . Emotionally Abused:   Marland Kitchen Physically Abused:   . Sexually Abused:     Past Surgical History:  Procedure Laterality Date  . CESAREAN SECTION N/A 10/09/2019   Procedure: CESAREAN SECTION;  Surgeon: Aletha Halim, MD;  Location: [facility];  Service: Obstetrics;  Laterality: N/A;  . COLONOSCOPY WITH  PROPOFOL  10/07/2016  . ESOPHAGOGASTRODUODENOSCOPY  12/21/2015  . EXCISION MASS UPPER EXTREMETIES Left 04/21/2017   Procedure: EXCISION MASS LEFT MIDDLE FINGER;  Surgeon: Daryll Brod, MD;  Location: [facility];  Service: Orthopedics;  Laterality: Left;  . IR FL GUIDED LOC OF NEEDLE/CATH TIP FOR SPINAL INJECTION RT  03/13/2020    Family History  Problem Relation Age of Onset  . Diabetes Maternal Aunt   . Hypertension Maternal Uncle   . Depression Maternal Grandfather   .  Diabetes Maternal Grandfather   . Hypertension Maternal Grandfather   . Arthritis Mother   . High blood pressure Mother   . Depression Mother   . Sleep apnea Mother   . Obesity Mother   . Diabetes Mother   . Hypertension Mother   . Sleep apnea Father   . Obesity Father     Allergies  Allergen Reactions  . Bee Pollen Hives, Shortness Of Breath and Swelling  . Bee Venom Hives, Shortness Of Breath and Swelling  . Hydrocodone-Acetaminophen Anaphylaxis    No problem when she takes Tylenol  . Peach Flavor Hives, Shortness Of Breath and Other (See Comments)    SWELLING OF MOUTH  . Pollen Extract Hives, Shortness Of Breath and Swelling    Current Outpatient Medications on File Prior to Visit  Medication Sig Dispense Refill  . amLODipine (NORVASC) 10 MG tablet Take 1 tablet (10 mg total) by mouth daily. 30 tablet 2  . baclofen (LIORESAL) 10 MG tablet Take 1 tablet (10 mg total) by mouth 2 (two) times daily. 30 each 0  . Blood Pressure Monitoring KIT 1 kit by Does not apply route once a week. 1 kit 0  . hydrochlorothiazide (HYDRODIURIL) 25 MG tablet Take 1 tablet (25 mg total) by mouth daily. 30 tablet 2  . ibuprofen (ADVIL) 800 MG tablet Take 1 tablet (800 mg total) by mouth every 8 (eight) hours as needed. 30 tablet 5  . loratadine (CLARITIN) 10 MG tablet Take 10 mg by mouth daily as needed for allergies.     Marland Kitchen ondansetron (ZOFRAN-ODT) 4 MG disintegrating tablet Take 4 mg by mouth every 8 (eight) hours  as needed.     . pantoprazole (PROTONIX) 40 MG tablet Take 1 tablet (40 mg total) by mouth daily. 30 tablet 3  . polyethylene glycol powder (GLYCOLAX/MIRALAX) 17 GM/SCOOP powder     . Semaglutide-Weight Management (WEGOVY) 0.25 MG/0.5ML SOAJ Inject 0.25 mg into the skin once a week. 2.24 mL 0  . sucralfate (CARAFATE) 1 GM/10ML suspension Take 10 mLs (1 g total) by mouth 4 (four) times daily -  with meals and at bedtime. 420 mL 0  . topiramate (TOPAMAX) 25 MG tablet Take 2 tablets (50 mg total) by mouth 2 (two) times daily. 180 tablet 1  . Vitamin D, Ergocalciferol, (DRISDOL) 1.25 MG (50000 UNIT) CAPS capsule Take 1 capsule (50,000 Units total) by mouth every 7 (seven) days. 4 capsule 0  . ferrous sulfate 325 (65 FE) MG tablet Take 1 tablet (325 mg total) by mouth every other day. 45 tablet 1   No current facility-administered medications on file prior to visit.    BP 140/84   Temp 98.5 F (36.9 C)   Wt (!) 450 lb (204.1 kg)   LMP  (LMP Unknown)   BMI 61.03 kg/m      Objective:   Physical Exam Vitals and nursing note reviewed.  Constitutional:      Appearance: Normal appearance. She is obese.  Cardiovascular:     Rate and Rhythm: Normal rate and regular rhythm.     Pulses: Normal pulses.     Heart sounds: Normal heart sounds.  Pulmonary:     Effort: Pulmonary effort is normal. No respiratory distress.     Breath sounds: No stridor. Wheezing (trace expiratory wheezing) present. No rhonchi or rales.  Skin:    General: Skin is warm and dry.     Capillary Refill: Capillary refill takes less than 2 seconds.  Neurological:  General: No focal deficit present.     Mental Status: She is alert and oriented to person, place, and time.  Psychiatric:        Mood and Affect: Mood normal.        Behavior: Behavior normal.        Thought Content: Thought content normal.        Judgment: Judgment normal.       Assessment & Plan:  1. Bronchitis - benzonatate (TESSALON) 200 MG  capsule; Take 1 capsule (200 mg total) by mouth 2 (two) times daily as needed for cough.  Dispense: 20 capsule; Refill: 0 - predniSONE (DELTASONE) 20 MG tablet; Take 1 tablet (20 mg total) by mouth daily with breakfast.  Dispense: 7 tablet; Refill: 0  Dorothyann Peng, NP

## 2020-05-30 ENCOUNTER — Other Ambulatory Visit: Payer: Self-pay | Admitting: Adult Health

## 2020-05-30 ENCOUNTER — Ambulatory Visit (INDEPENDENT_AMBULATORY_CARE_PROVIDER_SITE_OTHER): Payer: BC Managed Care – PPO

## 2020-05-30 ENCOUNTER — Encounter: Payer: Self-pay | Admitting: Adult Health

## 2020-05-30 DIAGNOSIS — J40 Bronchitis, not specified as acute or chronic: Secondary | ICD-10-CM

## 2020-05-30 DIAGNOSIS — J90 Pleural effusion, not elsewhere classified: Secondary | ICD-10-CM | POA: Diagnosis not present

## 2020-05-30 MED ORDER — BENZONATATE 200 MG PO CAPS: 200.0000 mg | ORAL_CAPSULE | Freq: Three times a day (TID) | ORAL | 1 refills | Status: DC | PRN

## 2020-05-31 ENCOUNTER — Other Ambulatory Visit: Payer: Self-pay

## 2020-05-31 ENCOUNTER — Ambulatory Visit: Payer: BC Managed Care – PPO | Admitting: Physical Therapy

## 2020-05-31 DIAGNOSIS — R293 Abnormal posture: Secondary | ICD-10-CM | POA: Diagnosis not present

## 2020-05-31 DIAGNOSIS — G8929 Other chronic pain: Secondary | ICD-10-CM | POA: Diagnosis not present

## 2020-05-31 DIAGNOSIS — M542 Cervicalgia: Secondary | ICD-10-CM

## 2020-05-31 DIAGNOSIS — M545 Low back pain, unspecified: Secondary | ICD-10-CM

## 2020-06-01 ENCOUNTER — Encounter: Payer: Self-pay | Admitting: Physical Therapy

## 2020-06-01 NOTE — Therapy (Addendum)
[place], [place], [address] Phone: [phone]   Fax:  [phone]  Physical Therapy Treatment/Recert/Discharge    Patient Details  Name: Maureen Lewis MRN: [id number] Date of Birth: 04-19-1996 Referring Provider (PT): Nafziger, Haynes Dage   Encounter Date: 05/31/2020   PT End of Session - 06/01/20 0807    Visit Number 10    Number of Visits 13    Date for PT Re-Evaluation 07/13/20    Authorization Type BCBS, MCD secondary resumitted on 04/16/2020    PT Start Time 1715    PT Stop Time 1755    PT Time Calculation (min) 40 min    Activity Tolerance Patient tolerated treatment well    Behavior During Therapy [facility] for tasks assessed/performed           Past Medical History:  Diagnosis Date  . Abnormal uterine bleeding   . Acid reflux   . Alpha thalassemia trait   . Amenorrhea   . Anemia    no current med.  . Back pain   . Cesarean delivery delivered 10/11/2019   10/09/2019 - primary CS for failed IOL  . Chest pain   . Complication of anesthesia    states had to keep giving her anesthesia during EGD  . Constipation   . Depression    "I'm good"  . Finger mass, left 03/2017   middle finger  . Gestational diabetes   . Gestational diabetes 05/23/2019   Current Diabetic Medications:  None  _0  Aspirin 81 mg daily after 12 weeks (? A2/B GDM)  Required Referrals for A1GDM or A2GDM: _1  Diabetes Education and Testing Supplies _2  Nutrition Cousult  For A2/B GDM or higher classes of DM _3  Diabetes Education and Testing Supplies _4  Nutrition Counsult _5  Fetal ECHO after 20 weeks  _6  Eye exam for retina evaluation   Baseline and surveillance labs (  . Gestational hypertension 09/23/2019   Guidelines for Antenatal Testing and Sonography  (with updated ICD-10 codes)  Updated  Sep 18, 2019 with Dr. Tama High  INDICATION U/S 2 X week NST/AFI  or full BPP wkly DELIVERY Diabetes   A1 - good control - O24.410    A2  - good control - O24.419      A2  - poor control or poor compliance - O24.419, E11.65   (Macrosomia or polyhydramnios) **E11.65 is extra code for poor control**    A2/B - O24.  Marland Kitchen Headache   . Hypertension   . Infection    UTI  . Irritable bowel syndrome (IBS)   . Joint pain   . Morbid obesity with body mass index (BMI) of 45.0 to 49.9 in adult [facility])   . Obesity during pregnancy, antepartum 04/06/2019   Body mass index is 53.71 kg/m.  Recommendations _7  Aspirin 81 mg daily after 12 weeks; discontinue after 36 weeks _8  Nutrition consult _9  Weight gain 11-20 lbs for singleton and 25-35 lbs for twin pregnancy (IOM guidelines) Higher class of obesity patients recommended to gain closer to lower limit  Weight loss is associated with adverse outcomes _10  Baseline and surveillance labs (pulled in fr  . Plantar fasciitis, bilateral   . Polycystic ovary syndrome   . Prediabetes    "prediabetes"  . Pregnancy induced hypertension   . Shortness of breath   . Sleep apnea    no CPAP use  . Supervision of normal first pregnancy 04/06/2019   [name]: [  x]Initial [ x]12 '[ ]'$ 20 '[ ]'$ 28 '[ ]'$ 32 '[ ]'$ 36 '[ ]'$ 38 '[ ]'$ 39 '[ ]'$ 40 Nursing Staff Provider Office Location [facility]  Dating   LMP Language  English  Anatomy US  Nml Flu Vaccine  Declined 07/28/19 Genetic Screen  NIPS: low risks   AFP:   negative  TDaP vaccine   08/25/19 Hgb A1C or  GTT Early A1C 5.8 Third trimester: GDM insulin Rhogam  NA   LAB RESULTS  Feeding Plan Breast Blood Type  . Vertigo 2017    Past Surgical History:  Procedure Laterality Date  . CESAREAN SECTION N/A 10/09/2019   Procedure: CESAREAN SECTION;  Surgeon: Aletha Halim, MD;  [facility];  Service: Obstetrics;  Laterality: N/A;  . COLONOSCOPY WITH PROPOFOL  10/07/2016  . ESOPHAGOGASTRODUODENOSCOPY  12/21/2015  . EXCISION MASS UPPER EXTREMETIES Left 04/21/2017   Procedure: EXCISION MASS LEFT MIDDLE FINGER;  Surgeon: Daryll Brod, MD;  Location: [place];   Service: Orthopedics;  Laterality: Left;  . IR FL GUIDED LOC OF NEEDLE/CATH TIP FOR SPINAL INJECTION RT  03/13/2020    There were no vitals filed for this visit.   Subjective Assessment - 06/01/20 0805    Subjective Patient has had bronchitis. She has been feeling sick but has still been tryingto do as much as she can. She is feeling better today. She is not havingneck or back pain today.    How long can you sit comfortably? unlimited    How long can you stand comfortably? unlimited    How long can you walk comfortably? unlimited    Diagnostic tests 02/21/2020 x-ray IMPRESSION:Negative cervical spine radiographs. head CT scan 03/09/2020 IMPRESSION:  Normal CT scan of the head without contrast    Currently in Pain? No/denies              [facility] PT Assessment - 06/01/20 0001      Assessment   Medical Diagnosis Chest pain, unspecified type R07.9, Cervical radiculopathy M54.12    Referring Provider (PT) Nafziger, Haynes Dage      AROM   Cervical Flexion 36    Cervical Extension 34    Cervical - Right Side Bend 48    Cervical - Right Rotation 60    Cervical - Left Rotation 60      Strength   Right Shoulder Flexion 5/5    Right Shoulder Extension 5/5    Right Shoulder Internal Rotation 4+/5    Right Shoulder External Rotation 4+/5    Left Shoulder Flexion 4+/5    Left Shoulder Extension 4+/5    Left Shoulder ABduction 4+/5    Left Shoulder Internal Rotation 4+/5    Left Shoulder External Rotation 4+/5    Right Hip Flexion 5/5    Right Hip Extension 5/5    Right Hip ABduction 5/5    Right Hip ADduction 5/5    Left Hip Flexion 5/5    Left Hip ABduction 5/5    Left Hip ADduction 5/5    Right Knee Flexion 5/5    Right Knee Extension 5/5    Left Knee Flexion 5/5    Left Knee Extension 5/5                         [facility] Adult PT Treatment/Exercise - 06/01/20 0001      Neck Exercises: Theraband   Shoulder Extension 20 reps;Red    Rows 20 reps;Red    Other  Theraband Exercises Bilateral er  seated 2x15; bilatteral horizontal abduction x15 bilateral; band flexion with abduction 2x15;       Lumbar Exercises: Stretches   Active Hamstring Stretch Limitations seated 2x20 sec hold     Other Lumbar Stretch Exercise ball roll AND STRETCH 5X10 SEC HOLD BILATERAL; LATERAL BALL ROLL x5 bilateral to each side 5 sec hold      Knee/Hip Exercises: Standing   Heel Raises 20 reps    Other Standing Knee Exercises slow march 2x15       Knee/Hip Exercises: Seated   Long Arc Quad Limitations 2x20     Other Seated Knee/Hip Exercises seated hamstring curl 2x15 red     Abd/Adduction Limitations band red 2x10     Sit to Sand 2 sets;10 reps                  PT Education - 06/01/20 0807    Education Details reviewed ther-ex            PT Short Term Goals - 04/16/20 1152      PT SHORT TERM GOAL #1   Title pt to be I with inital HEP    Period Weeks    Status Achieved      PT SHORT TERM GOAL #2   Title pt to verbalize and demo efficient posture and lifting mechanics    Period Weeks    Status Achieved             PT Long Term Goals - 04/16/20 1153      PT LONG TERM GOAL #1   Title pt to maintain cervical ROM with </= 1/10 pain for functional ROM required for ADLS    Baseline functional ROM in all planes but noted 6-7/10 pain    Period Weeks    Status On-going    Target Date 05/28/20      PT LONG TERM GOAL #2   Title pt to maintain bil shoudler strenght with no report of pain for functional lifting/ carrying activitis for work and ADLs    Baseline functional strength but 6-7/10 poain during testing of flexion/ abduction    Period Weeks    Target Date 05/28/20      PT LONG TERM GOAL #3   Title pt to rpeort decreased spasm in bil upper trap and surrounding musculature to promote neck ROM with </= 2/10 pain    Baseline spasm noted in bil upper trap/ levator scapulae with 6-7/10 pain    Period Weeks    Target Date 05/28/20      PT LONG  TERM GOAL #4   Title pt to be I with all HEP given to maintain and progress current level of function    Baseline independent with current HEP    Time 6    Period Weeks    Status On-going    Target Date 05/28/20      PT LONG TERM GOAL #5   Title pt to maintain and current trunk ROM with </= 2/10 pain for improvement of condition    Baseline pain at 8/10 in all ranges    Time 6    Period Weeks    Status On-going    Target Date 05/28/20      PT LONG TERM GOAL #6   Title pt to be able to sit, stand and walk for >/= 1 hour with </= 2/10 at max pain in the back for functional endurance required for work related activities.    Baseline max pain  is 10/10 with standing/ walking    Period Weeks    Status On-going                 Plan - 06/01/20 0809    Clinical Impression Statement Patient continues to make great progress with PT. On days she is not working she has very little pain. She stiull has some pain at work but it is improving. She is tolerating ther-ex well and continues to demonstrate strength gains in her UE/LE. She would benefit from further skilled therapy 1W6 to continue to work on funcitonal strengthening andimprove general mobility.    Personal Factors and Comorbidities Age;Past/Current Experience;Comorbidity 3+    Comorbidities hx of depression, DM, vertigo    Stability/Clinical Decision Making Evolving/Moderate complexity    Clinical Decision Making Moderate    Rehab Potential Good    PT Frequency 2x / week    PT Duration 6 weeks    PT Treatment/Interventions ADLs/Self Care Home Management;Electrical Stimulation;Iontophoresis 30m/ml Dexamethasone;Dry needling;Moist Heat;Ultrasound;Therapeutic activities;Therapeutic exercise;Balance training;Neuromuscular re-education;Manual techniques;Passive range of motion;Patient/family education;Taping    PT Next Visit Plan response to DN PRN, posture enducation, posterior shoulder strengthening,  hip/ core strengthening, work on  endurance training.    PT Home Exercise Plan [id number] - upper trap stretch, levator scapuale stretch, chin tuck (supine), scapular retraction. lower trunk rotation, supine marching, low back stretch, clamshells, rows, shoulder extension; standing hip abduction/ extension and heel raise. Access Code: [email or web address] .Shoulder extension with resistance .Scapular Retraction with Resistance Hooklying Clamshell with Resistance ,Seated Isometric Hip Adduction with Ball    Consulted and Agree with Plan of Care Patient           Patient will benefit from skilled therapeutic intervention in order to improve the following deficits and impairments:  Obesity, Improper body mechanics, Increased muscle spasms, Decreased strength, Postural dysfunction, Pain, Decreased activity tolerance, Decreased endurance, Decreased range of motion  Visit Diagnosis: Cervicalgia - Plan: PT plan of care cert/re-cert  Abnormal posture - Plan: PT plan of care cert/re-cert  Chronic bilateral low back pain, unspecified whether sciatica present - Plan: PT plan of care cert/re-cert     Problem List Patient Active Problem List   Diagnosis Date Noted  . Hx of migraine headaches 04/09/2020  . Postpartum care following cesarean delivery 11/22/2019  . Contraceptive management 11/22/2019  . Positive RPR test 10/09/2019  . Migraine headache 07/12/2019  . Alpha thalassemia trait   . Vitamin D deficiency 09/16/2017  . Prediabetes 09/16/2017  . Depression 09/16/2017  . Class 3 severe obesity with serious comorbidity and body mass index (BMI) of 50.0 to 59.9 in adult ([place] 09/16/2017  . PCOS (polycystic ovarian syndrome) 08/13/2017  . Mass 05/06/2017  . Vertigo 10/06/2016  . Anemia 09/26/2016  . Pseudotumor cerebri syndrome 08/28/2016  . Ob[place] 08/28/2016  . Benign paroxysmal positional vertigo due to bilateral vestibular disorder 08/28/2016  . OSA (obstructive sleep apnea) 08/28/2016  . Hypersomnia  with sleep apnea 08/28/2016  . Female hirsutism 08/28/2016  . GERD (gastroesophageal reflux disease) 02/24/2015  . Constipation 10/02/2014   PHYSICAL THERAPY DISCHARGE SUMMARY  Visits from Start of Care: 10  Current functional level related to goals / functional outcomes: Significant improvement in pain   Remaining deficits: Pain at times when she stands    Education / Equipment: HEP   Plan: Patient agrees to discharge.  Patient goals were met. Patient is being discharged due to meeting the stated rehab goals.  ?????  Carney Living  PT DPT  06/01/2020, 8:28 AM  [facility] [address] [place], [place], [address] Phone: [phone]   Fax:  [phone]  Name: Maureen Lewis MRN: [id number] Date of Birth: [birth year]

## 2020-06-05 NOTE — Progress Notes (Deleted)
Office Visit Note  Patient: Maureen Lewis             Date of Birth: 1996-06-17           MRN: [id number]             PCP: Dorothyann Peng, NP Referring: Dorothyann Peng, NP Visit Date: 06/18/2020 Occupation: @GUAROCC @  Subjective:  No chief complaint on file.   History of Present Illness: Maureen Lewis is a 24 y.o. female ***   Activities of Daily Living:  Patient reports morning stiffness for *** {minute/hour:19697}.   Patient {ACTIONS;DENIES/REPORTS:21021675::"Denies"} nocturnal pain.  Difficulty dressing/grooming: {ACTIONS;DENIES/REPORTS:21021675::"Denies"} Difficulty climbing stairs: {ACTIONS;DENIES/REPORTS:21021675::"Denies"} Difficulty getting out of chair: {ACTIONS;DENIES/REPORTS:21021675::"Denies"} Difficulty using hands for taps, buttons, cutlery, and/or writing: {ACTIONS;DENIES/REPORTS:21021675::"Denies"}  No Rheumatology ROS completed.   PMFS History:  Patient Active Problem List   Diagnosis Date Noted  . Hx of migraine headaches 04/09/2020  . Postpartum care following cesarean delivery 11/22/2019  . Contraceptive management 11/22/2019  . Positive RPR test 10/09/2019  . Migraine headache 07/12/2019  . Alpha thalassemia trait   . Vitamin D deficiency 09/16/2017  . Prediabetes 09/16/2017  . Depression 09/16/2017  . Class 3 severe obesity with serious comorbidity and body mass index (BMI) of 50.0 to 59.9 in adult ([place]) 09/16/2017  . PCOS (polycystic ovarian syndrome) 08/13/2017  . Mass 05/06/2017  . Vertigo 10/06/2016  . Anemia 09/26/2016  . Pseudotumor cerebri syndrome 08/28/2016  . Obesity hypoventilation syndrome ([place]) 08/28/2016  . Benign paroxysmal positional vertigo due to bilateral vestibular disorder 08/28/2016  . OSA (obstructive sleep apnea) 08/28/2016  . Hypersomnia with sleep apnea 08/28/2016  . Female hirsutism 08/28/2016  . GERD (gastroesophageal reflux disease) 02/24/2015  . Constipation 10/02/2014    Past Medical History:  Diagnosis Date  .  Abnormal uterine bleeding   . Acid reflux   . Alpha thalassemia trait   . Amenorrhea   . Anemia    no current med.  . Back pain   . Cesarean delivery delivered 10/11/2019   10/09/2019 - primary CS for failed IOL  . Chest pain   . Complication of anesthesia    states had to keep giving her anesthesia during EGD  . Constipation   . Depression    "I'm good"  . Finger mass, left 03/2017   middle finger  . Gestational diabetes   . Gestational diabetes 05/23/2019   Current Diabetic Medications:  None  [ ]  Aspirin 81 mg daily after 12 weeks (? A2/B GDM)  Required Referrals for A1GDM or A2GDM: [ ]  Diabetes Education and Testing Supplies [ ]  Nutrition Cousult  For A2/B GDM or higher classes of DM [ ]  Diabetes Education and Testing Supplies [ ]  Nutrition Counsult [ ]  Fetal ECHO after 20 weeks  [ ]  Eye exam for retina evaluation   Baseline and surveillance labs (  . Gestational hypertension 09/23/2019   Guidelines for Antenatal Testing and Sonography  (with updated ICD-10 codes)  Updated  24-Sep-2019 with Dr. Tama High  INDICATION U/S 2 X week NST/AFI  or full BPP wkly DELIVERY Diabetes   A1 - good control - O24.410    A2 - good control - O24.419      A2  - poor control or poor compliance - O24.419, E11.65   (Macrosomia or polyhydramnios) **E11.65 is extra code for poor control**    A2/B - O24.  Marland Kitchen Headache   . Hypertension   . Infection    UTI  . Irritable bowel syndrome (IBS)   .  Joint pain   . Morbid obesity with body mass index (BMI) of 45.0 to 49.9 in adult [facility])   . Obesity during pregnancy, antepartum 04/06/2019   Body mass index is 53.71 kg/m.  Recommendations [x]  Aspirin 81 mg daily after 12 weeks; discontinue after 36 weeks [ ]  Nutrition consult [ ]  Weight gain 11-20 lbs for singleton and 25-35 lbs for twin pregnancy (IOM guidelines) Higher class of obesity patients recommended to gain closer to lower limit  Weight loss is associated with adverse outcomes [ ]  Baseline and surveillance labs  (pulled in fr  . Plantar fasciitis, bilateral   . Polycystic ovary syndrome   . Prediabetes    "prediabetes"  . Pregnancy induced hypertension   . Shortness of breath   . Sleep apnea    no CPAP use  . Supervision of normal first pregnancy 04/06/2019   [name]: [ x]Initial [ x]12 [ ] 20 [ ] 28 [ ] 32 [ ] 36 [ ] 38 [ ] 39 [ ] 40 Nursing Staff Provider Office Location [facility]  Dating   LMP Language  English  Anatomy US  Nml Flu Vaccine  Declined 07/28/19 Genetic Screen  NIPS: low risks   AFP:   negative  TDaP vaccine   08/25/19 Hgb A1C or  GTT Early A1C 5.8 Third trimester: GDM insulin Rhogam  NA   LAB RESULTS  Feeding Plan Breast Blood Type  . Vertigo 2017    Family History  Problem Relation Age of Onset  . Diabetes Maternal Aunt   . Hypertension Maternal Uncle   . Depression Maternal Grandfather   . Diabetes Maternal Grandfather   . Hypertension Maternal Grandfather   . Arthritis Mother   . High blood pressure Mother   . Depression Mother   . Sleep apnea Mother   . Obesity Mother   . Diabetes Mother   . Hypertension Mother   . Sleep apnea Father   . Obesity Father    Past Surgical History:  Procedure Laterality Date  . CESAREAN SECTION N/A 10/09/2019   Procedure: CESAREAN SECTION;  Surgeon: Aletha Halim, MD;  [facility];  Service: Obstetrics;  Laterality: N/A;  . COLONOSCOPY WITH PROPOFOL  10/07/2016  . ESOPHAGOGASTRODUODENOSCOPY  12/21/2015  . EXCISION MASS UPPER EXTREMETIES Left 04/21/2017   Procedure: EXCISION MASS LEFT MIDDLE FINGER;  Surgeon: Daryll Brod, MD;  Location: [place];  Service: Orthopedics;  Laterality: Left;  . IR FL GUIDED LOC OF NEEDLE/CATH TIP FOR SPINAL INJECTION RT  03/13/2020   Social History   Social History Narrative   Right handed    Soda sometimes   Lives with mom and cousin, a grandmother in [place] also helps care for her.       She is in nursing school.    Immunization History  Administered Date(s)  Administered  . HPV Quadrivalent 05/20/2012, 07/22/2012, 08/23/2013  . Influenza,inj,Quad PF,6+ Mos 08/23/2013, 07/18/2014, 08/25/2019  . Tdap 07/12/2009, 08/25/2019     Objective: Vital Signs: LMP  (LMP Unknown)    Physical Exam   Musculoskeletal Exam: ***  CDAI Exam: CDAI Score: -- Patient Global: --; Provider Global: -- Swollen: --; Tender: -- Joint Exam 06/18/2020   No joint exam has been documented for this visit   There is currently no information documented on the homunculus. Go to the Rheumatology activity and complete the homunculus joint exam.  Investigation: No additional findings.  Imaging: CT Abdomen Pelvis Wo Contrast  Result Date: 05/11/2020 CLINICAL DATA:  Generalized GI motility disorder suspected. Question  ulcer. Black stools for 1 month. EXAM: CT ABDOMEN AND PELVIS WITHOUT CONTRAST TECHNIQUE: Multidetector CT imaging of the abdomen and pelvis was performed following the standard protocol without IV contrast. COMPARISON:  09/13/2016 FINDINGS: Lower chest: Lung bases are clear. No effusions. Heart is normal size. Hepatobiliary: No focal hepatic abnormality. Gallbladder unremarkable. Pancreas: No focal abnormality or ductal dilatation. Spleen: No focal abnormality.  Normal size. Adrenals/Urinary Tract: No adrenal abnormality. No focal renal abnormality. No stones or hydronephrosis. Urinary bladder is unremarkable. Stomach/Bowel: Stomach, large and small bowel grossly unremarkable. Appendix normal. Vascular/Lymphatic: No evidence of aneurysm or adenopathy. Reproductive: Uterus and adnexa unremarkable.  No mass. Other: No free fluid or free air. Musculoskeletal: No acute bony abnormality. IMPRESSION: No acute findings or significant abnormality seen in the abdomen or pelvis. Electronically Signed   By: Rolm Baptise M.D.   On: 05/11/2020 18:11   DG Chest 2 View  Result Date: 05/31/2020 CLINICAL DATA:  Left anterior chest pain 3 days.  Cough. EXAM: CHEST - 2 VIEW  COMPARISON:  02/16/2020 FINDINGS: Lungs are adequately inflated without focal airspace consolidation or effusion. Cardiomediastinal silhouette and bony structures are within normal. Moderate overlying soft tissues. IMPRESSION: No active cardiopulmonary disease. Electronically Signed   By: Marin Olp M.D.   On: 05/31/2020 14:43    Recent Labs: Lab Results  Component Value Date   WBC 7.6 05/16/2020   HGB 12.5 05/16/2020   PLT 299 05/16/2020   NA 139 05/16/2020   K 3.8 05/16/2020   CL 107 05/16/2020   CO2 23 05/16/2020   GLUCOSE 89 05/16/2020   BUN 11 05/16/2020   CREATININE 0.79 05/16/2020   BILITOT 0.7 05/16/2020   ALKPHOS 100 05/16/2020   AST 16 05/16/2020   ALT 17 05/16/2020   PROT 8.4 (H) 05/16/2020   ALBUMIN 4.1 05/16/2020   CALCIUM 10.5 (H) 05/16/2020   GFRAA >60 05/16/2020    Speciality Comments: No specialty comments available.  Procedures:  No procedures performed Allergies: Bee pollen, Bee venom, Hydrocodone-acetaminophen, Peach flavor, and Pollen extract   Assessment / Plan:     Visit Diagnoses: No diagnosis found.  Orders: No orders of the defined types were placed in this encounter.  No orders of the defined types were placed in this encounter.   Face-to-face time spent with patient was *** minutes. Greater than 50% of time was spent in counseling and coordination of care.  Follow-Up Instructions: No follow-ups on file.   Ofilia Neas, PA-C  Note - This record has been created using Dragon software.  Chart creation errors have been sought, but may not always  have been located. Such creation errors do not reflect on  the standard of medical care.

## 2020-06-06 ENCOUNTER — Other Ambulatory Visit: Payer: Self-pay

## 2020-06-06 ENCOUNTER — Encounter (INDEPENDENT_AMBULATORY_CARE_PROVIDER_SITE_OTHER): Payer: Self-pay | Admitting: Family Medicine

## 2020-06-06 ENCOUNTER — Ambulatory Visit: Payer: BC Managed Care – PPO | Admitting: Physical Therapy

## 2020-06-06 ENCOUNTER — Ambulatory Visit (INDEPENDENT_AMBULATORY_CARE_PROVIDER_SITE_OTHER): Payer: BC Managed Care – PPO | Admitting: Family Medicine

## 2020-06-06 VITALS — BP 113/81 | HR 98 | Temp 98.2°F | Ht 72.0 in | Wt >= 6400 oz

## 2020-06-06 DIAGNOSIS — I10 Essential (primary) hypertension: Secondary | ICD-10-CM

## 2020-06-06 DIAGNOSIS — D508 Other iron deficiency anemias: Secondary | ICD-10-CM | POA: Diagnosis not present

## 2020-06-06 DIAGNOSIS — Z9189 Other specified personal risk factors, not elsewhere classified: Secondary | ICD-10-CM | POA: Diagnosis not present

## 2020-06-06 DIAGNOSIS — Z6841 Body Mass Index (BMI) 40.0 and over, adult: Secondary | ICD-10-CM

## 2020-06-06 MED ORDER — WEGOVY 0.5 MG/0.5ML ~~LOC~~ SOAJ: 0.5000 mg | SUBCUTANEOUS | 0 refills | Status: DC

## 2020-06-06 NOTE — Progress Notes (Signed)
Chief Complaint:   OBESITY Maureen Lewis is here to discuss her progress with her obesity treatment plan along with follow-up of her obesity related diagnoses. Maureen Lewis is on the Category 3 Plan and states she is following her eating plan approximately 35% of the time. Maureen Lewis states she is doing 0 minutes 0 times per week.  Today's visit was #: 5 Starting weight: 437 lbs Starting date: 04/09/2020 Today's weight: 441 lbs Today's date: 06/06/2020 Total lbs lost to date: 0 Total lbs lost since last in-office visit: 0  Interim History: Maureen Lewis was sick a good portion of the last few weeks. She was only really tolerating liquids like broth or Pedialyte. She just started being able to tolerate food in the last few days (she was sick with RSV and bronchitis). Her child was ill as well. She wants to get back on the plan.  Subjective:   1. Essential hypertension Maureen Lewis's blood pressure is well controlled today. She denies chest pain, chest pressure, or headache. She is on amlodipine and hydrochlorothiazide.  2. Other iron deficiency anemia Maureen Lewis's last iron was slightly low but her MCV was at 70. She is on oral iron supplement.  3. At risk for osteoporosis Maureen Lewis is at higher risk of osteopenia and osteoporosis due to Vitamin D deficiency.   Assessment/Plan:   1. Essential hypertension Maureen Lewis will continue her current medications, and will continue working on healthy weight loss and exercise to improve blood pressure control. We will watch for signs of hypotension as she continues her lifestyle modifications.  2. Other iron deficiency anemia We will follow up on labs in 6 weeks, and Maureen Lewis will follow up as directed. Orders and follow up as documented in patient record.  Counseling  Iron is essential for our bodies to make red blood cells.  Reasons that someone may be deficient include: an iron-deficient diet (more likely in those following vegan or vegetarian diets), women with heavy menses, patients  with GI disorders or poor absorption, patients that have had bariatric surgery, frequent blood donors, patients with cancer, and patients with heart disease.    Iron-rich foods include dark leafy greens, red and white meats, eggs, seafood, and beans.    Certain foods and drinks prevent your body from absorbing iron properly. Avoid eating these foods in the same meal as iron-rich foods or with iron supplements. These foods include: coffee, black tea, and red wine; milk, dairy products, and foods that are high in calcium; beans and soybeans; whole grains.   Constipation can be a side effect of iron supplementation. Increased water and fiber intake are helpful. Water goal: > 2 liters/day. Fiber goal: > 25 grams/day.  3. At risk for osteoporosis Maureen Lewis was given approximately 15 minutes of osteoporosis prevention counseling today. Maureen Lewis is at risk for osteopenia and osteoporosis due to her Vitamin D deficiency. She was encouraged to take her Vitamin D and follow her higher calcium diet and increase strengthening exercise to help strengthen her bones and decrease her risk of osteopenia and osteoporosis.  Repetitive spaced learning was employed today to elicit superior memory formation and behavioral change.  4. Class 3 severe obesity with serious comorbidity and body mass index (BMI) of 50.0 to 59.9 in adult, unspecified obesity type (HCC) Maureen Lewis is currently in the action stage of change. As such, her goal is to continue with weight loss efforts. She has agreed to the Category 3 Plan.   We discussed various medication options to help Maureen Lewis with her weight loss  efforts and we both agreed to increase Wegovy to 0.5 mg SubQ weekly, and we will refill for 1 month.  - Semaglutide-Weight Management (WEGOVY) 0.5 MG/0.5ML SOAJ; Inject 0.5 mg into the skin once a week.  Dispense: 2 mL; Refill: 0  Exercise goals: No exercise has been prescribed at this time.  Behavioral modification strategies: increasing lean  protein intake, increasing vegetables, meal planning and cooking strategies and keeping healthy foods in the home.  Maureen Lewis has agreed to follow-up with our clinic in 2 weeks. She was informed of the importance of frequent follow-up visits to maximize her success with intensive lifestyle modifications for her multiple health conditions.   Objective:   Blood pressure 113/81, pulse 98, temperature 98.2 F (36.8 C), temperature source Oral, height 6' (1.829 m), weight (!) 441 lb (200 kg), SpO2 99 %, not currently breastfeeding. Body mass index is 59.81 kg/m.  General: Cooperative, alert, well developed, in no acute distress. HEENT: Conjunctivae and lids unremarkable. Cardiovascular: Regular rhythm.  Lungs: Normal work of breathing. Neurologic: No focal deficits.   Lab Results  Component Value Date   CREATININE 0.79 05/16/2020   BUN 11 05/16/2020   NA 139 05/16/2020   K 3.8 05/16/2020   CL 107 05/16/2020   CO2 23 05/16/2020   Lab Results  Component Value Date   ALT 17 05/16/2020   AST 16 05/16/2020   ALKPHOS 100 05/16/2020   BILITOT 0.7 05/16/2020   Lab Results  Component Value Date   HGBA1C 6.0 (H) 04/09/2020   HGBA1C 5.8 (H) 04/06/2019   HGBA1C 6.0 04/27/2018   HGBA1C 5.8 (H) 08/13/2017   HGBA1C 5.9 03/08/2014   Lab Results  Component Value Date   INSULIN 36.8 (H) 04/09/2020   INSULIN 42.1 (H) 08/13/2017   Lab Results  Component Value Date   TSH 1.270 04/09/2020   Lab Results  Component Value Date   CHOL 156 04/09/2020   HDL 42 04/09/2020   LDLCALC 101 (H) 04/09/2020   TRIG 63 04/09/2020   CHOLHDL 3 04/27/2018   Lab Results  Component Value Date   WBC 7.6 05/16/2020   HGB 12.5 05/16/2020   HCT 41.3 05/16/2020   MCV 70.2 (L) 05/16/2020   PLT 299 05/16/2020   Lab Results  Component Value Date   IRON 36 (L) 05/16/2020   TIBC 475 (H) 05/16/2020   FERRITIN 56 05/16/2020   Attestation Statements:   Reviewed by clinician on day of visit: allergies,  medications, problem list, medical history, surgical history, family history, social history, and previous encounter notes.   I, Trixie Dredge, am acting as transcriptionist for Coralie Common, MD.  I have reviewed the above documentation for accuracy and completeness, and I agree with the above. - Jinny Blossom, MD

## 2020-06-09 ENCOUNTER — Ambulatory Visit (INDEPENDENT_AMBULATORY_CARE_PROVIDER_SITE_OTHER)
Admission: EM | Admit: 2020-06-09 | Discharge: 2020-06-09 | Disposition: A | Discharge status: Home / Self Care | Payer: BC Managed Care – PPO | Source: Home / Self Care

## 2020-06-09 ENCOUNTER — Other Ambulatory Visit: Payer: Self-pay

## 2020-06-09 ENCOUNTER — Ambulatory Visit (INDEPENDENT_AMBULATORY_CARE_PROVIDER_SITE_OTHER): Payer: BC Managed Care – PPO

## 2020-06-09 ENCOUNTER — Encounter (HOSPITAL_COMMUNITY): Payer: Self-pay

## 2020-06-09 DIAGNOSIS — U071 COVID-19: Secondary | ICD-10-CM

## 2020-06-09 DIAGNOSIS — J1282 Pneumonia due to coronavirus disease 2019: Secondary | ICD-10-CM | POA: Insufficient documentation

## 2020-06-09 DIAGNOSIS — J40 Bronchitis, not specified as acute or chronic: Secondary | ICD-10-CM

## 2020-06-09 DIAGNOSIS — I517 Cardiomegaly: Secondary | ICD-10-CM | POA: Diagnosis not present

## 2020-06-09 DIAGNOSIS — R509 Fever, unspecified: Secondary | ICD-10-CM | POA: Diagnosis not present

## 2020-06-09 DIAGNOSIS — Z20822 Contact with and (suspected) exposure to covid-19: Secondary | ICD-10-CM

## 2020-06-09 DIAGNOSIS — R05 Cough: Secondary | ICD-10-CM | POA: Diagnosis not present

## 2020-06-09 MED ORDER — DM-GUAIFENESIN ER 30-600 MG PO TB12: 1.0000 | ORAL_TABLET | Freq: Two times a day (BID) | ORAL | 0 refills | Status: DC

## 2020-06-09 MED ORDER — BENZONATATE 200 MG PO CAPS: 200.0000 mg | ORAL_CAPSULE | Freq: Three times a day (TID) | ORAL | 0 refills | Status: DC | PRN

## 2020-06-09 MED ORDER — ONDANSETRON 4 MG PO TBDP: 4.0000 mg | ORAL_TABLET | Freq: Three times a day (TID) | ORAL | 0 refills | Status: DC | PRN

## 2020-06-09 MED ORDER — IBUPROFEN 800 MG PO TABS: 800.0000 mg | ORAL_TABLET | Freq: Three times a day (TID) | ORAL | 0 refills | Status: DC

## 2020-06-09 NOTE — ED Triage Notes (Signed)
Pt present coughing and SOB, symptoms started 3 days ago.

## 2020-06-09 NOTE — Discharge Instructions (Signed)
Xray is suggestive of COVID pneumonia Rest and fluids Tessalon for cough Mucinex DM for congestion/cough Follow up in emergency room if symptoms worsening

## 2020-06-09 NOTE — ED Notes (Signed)
SaO2 95% when ambulated approx 30 feet; heart rate 140.

## 2020-06-10 LAB — SARS CORONAVIRUS 2 (TAT 6-24 HRS): SARS Coronavirus 2: POSITIVE — AB

## 2020-06-10 NOTE — ED Provider Notes (Signed)
[place]    CSN: [id number] Arrival date & time: 06/09/20  1413      History   Chief Complaint Chief Complaint  Patient presents with   Shortness of Breath   Cough    HPI Maureen Lewis is a 24 y.o. female presenting today for evaluation of cough and shortness of breath.  Patient reports that over the past 3 days she has had worsening cough and shortness of breath.  Reports that she was diagnosed with bronchitis approximately 2 to 3 weeks ago and finished a course of prednisone and was using Tessalon.  3 days ago symptoms worsened.  Family members with similar symptoms over the past few days as well.  Using Robitussin and other over-the-counter medicines without relief of symptoms.  HPI  Past Medical History:  Diagnosis Date   Abnormal uterine bleeding    Acid reflux    Alpha thalassemia trait    Amenorrhea    Anemia    no current med.   Back pain    Cesarean delivery delivered 10/11/2019   10/09/2019 - primary CS for failed IOL   Chest pain    Complication of anesthesia    states had to keep giving her anesthesia during EGD   Constipation    Depression    "I'm good"   Finger mass, left 03/2017   middle finger   Gestational diabetes    Gestational diabetes 05/23/2019   Current Diabetic Medications:  None  [ ] Aspirin 81 mg daily after 12 weeks (? A2/B GDM)  Required Referrals for A1GDM or A2GDM: [ ] Diabetes Education and Equities trader [ ] Nutrition Cousult  For A2/B GDM or higher classes of DM [ ] Diabetes Education and Equities trader [ ] Nutrition Counsult [ ] Fetal ECHO after 20 weeks  [ ] Eye exam for retina evaluation   Baseline and surveillance labs (   Gestational hypertension 09/23/2019   Guidelines for Antenatal Testing and Sonography  (with updated ICD-10 codes)  Updated  2019/10/01 with Dr. Tama High  INDICATION U/S 2 X week NST/AFI  or full BPP wkly DELIVERY Diabetes   A1 - good control - O24.410    A2 - good control - O24.419       A2  - poor control or poor compliance - O24.419, E11.65   (Macrosomia or polyhydramnios) **E11.65 is extra code for poor control**    A2/B - O24.   Headache    Hypertension    Infection    UTI   Irritable bowel syndrome (IBS)    Joint pain    Morbid obesity with body mass index (BMI) of 45.0 to 49.9 in adult [facility])    Obesity during pregnancy, antepartum 04/06/2019   Body mass index is 53.71 kg/m.  Recommendations [x] Aspirin 81 mg daily after 12 weeks; discontinue after 36 weeks [ ] Nutrition consult [ ] Weight gain 11-20 lbs for singleton and 25-35 lbs for twin pregnancy (IOM guidelines) Higher class of obesity patients recommended to gain closer to lower limit  Weight loss is associated with adverse outcomes [ ] Baseline and surveillance labs (pulled in fr   Plantar fasciitis, bilateral    Polycystic ovary syndrome    Prediabetes    "prediabetes"   Pregnancy induced hypertension    Shortness of breath    Sleep apnea    no CPAP use   Supervision of normal first pregnancy 04/06/2019   [name]: [ x]Initial [ x]12 [ ]20 [ ]  54 [ ]32 [ ]36 [ ]38 [ ]39 [ ]40 Nursing Staff Provider Office Location [facility]  Dating   LMP Language  English  Anatomy US  Nml Flu Vaccine  Declined 07/28/19 Genetic Screen  NIPS: low risks   AFP:   negative  TDaP vaccine   08/25/19 Hgb A1C or  GTT Early A1C 5.8 Third trimester: GDM insulin Rhogam  NA   LAB RESULTS  Feeding Plan Breast Blood Type   Vertigo 2017    Patient Active Problem List   Diagnosis Date Noted   Hx of migraine headaches 04/09/2020   Postpartum care following cesarean delivery 11/22/2019   Contraceptive management 11/22/2019   Positive RPR test 10/09/2019   Migraine headache 07/12/2019   Alpha thalassemia trait    Vitamin D deficiency 09/16/2017   Prediabetes 09/16/2017   Depression 09/16/2017   Class 3 severe obesity with serious comorbidity and body mass index (BMI) of 50.0 to 59.9 in adult ([place])  09/16/2017   PCOS (polycystic ovarian syndrome) 08/13/2017   Mass 05/06/2017   Vertigo 10/06/2016   Anemia 09/26/2016   Pseudotumor cerebri syndrome 08/28/2016   Obesity hypoventilation syndrome ([place]) 08/28/2016   Benign paroxysmal positional vertigo due to bilateral vestibular disorder 08/28/2016   OSA (obstructive sleep apnea) 08/28/2016   Hypersomnia with sleep apnea 08/28/2016   Female hirsutism 08/28/2016   GERD (gastroesophageal reflux disease) 02/24/2015   Constipation 10/02/2014    Past Surgical History:  Procedure Laterality Date   CESAREAN SECTION N/A 10/09/2019   Procedure: CESAREAN SECTION;  Surgeon: Aletha Halim, MD;  Location: [facility] LD [facility];  Service: Obstetrics;  Laterality: N/A;   COLONOSCOPY WITH PROPOFOL  10/07/2016   ESOPHAGOGASTRODUODENOSCOPY  12/21/2015   EXCISION MASS UPPER EXTREMETIES Left 04/21/2017   Procedure: EXCISION MASS LEFT MIDDLE FINGER;  Surgeon: Daryll Brod, MD;  Location: [place];  Service: Orthopedics;  Laterality: Left;   IR FL GUIDED LOC OF NEEDLE/CATH TIP FOR SPINAL INJECTION RT  03/13/2020    OB History    Gravida  1   Para  1   Term  1   Preterm  0   AB  0   Living  1     SAB  0   TAB  0   Ectopic  0   Multiple  0   Live Births  1            Home Medications    Prior to Admission medications   Medication Sig Start Date End Date Taking? Authorizing Provider  amLODipine (NORVASC) 10 MG tablet Take 1 tablet (10 mg total) by mouth daily. 10/27/19   Lavonia Drafts, MD  baclofen (LIORESAL) 10 MG tablet Take 1 tablet (10 mg total) by mouth 2 (two) times daily. 02/28/20   Nafziger, Tommi Rumps, NP  benzonatate (TESSALON) 200 MG capsule Take 1 capsule (200 mg total) by mouth 3 (three) times daily as needed for up to 7 days for cough. 06/09/20 06/16/20  Talaysha Freeberg C, PA-C  Blood Pressure Monitoring KIT 1 kit by Does not apply route once a week. 05/05/19   Sloan Leiter, MD    dextromethorphan-guaiFENesin [facility] DM) 30-600 MG 12hr tablet Take 1 tablet by mouth 2 (two) times daily. 06/09/20   Awesome Jared C, PA-C  ferrous sulfate 325 (65 FE) MG tablet Take 1 tablet (325 mg total) by mouth every other day. 02/23/20 05/23/20  Nafziger, Tommi Rumps, NP  hydrochlorothiazide (HYDRODIURIL) 25 MG tablet Take 1 tablet (25 mg total) by mouth  daily. 10/27/19   Lavonia Drafts, MD  ibuprofen (ADVIL) 800 MG tablet Take 1 tablet (800 mg total) by mouth 3 (three) times daily. 06/09/20   Wieters, Hallie C, PA-C  loratadine (CLARITIN) 10 MG tablet Take 10 mg by mouth daily as needed for allergies.     [provider]  ondansetron (ZOFRAN ODT) 4 MG disintegrating tablet Take 1 tablet (4 mg total) by mouth every 8 (eight) hours as needed for nausea or vomiting. 06/09/20   Wieters, Hallie C, PA-C  pantoprazole (PROTONIX) 40 MG tablet Take 1 tablet (40 mg total) by mouth daily. 05/04/20   Nafziger, Tommi Rumps, NP  polyethylene glycol powder (GLYCOLAX/MIRALAX) 17 GM/SCOOP powder  10/11/19   [provider]  Semaglutide-Weight Management (WEGOVY) 0.5 MG/0.5ML SOAJ Inject 0.5 mg into the skin once a week. 06/06/20   Eber Jones, MD  sucralfate (CARAFATE) 1 GM/10ML suspension Take 10 mLs (1 g total) by mouth 4 (four) times daily -  with meals and at bedtime. 05/04/20   Nafziger, Tommi Rumps, NP  topiramate (TOPAMAX) 25 MG tablet Take 2 tablets (50 mg total) by mouth 2 (two) times daily. 02/23/20   Dohmeier, Asencion Partridge, MD  Vitamin D, Ergocalciferol, (DRISDOL) 1.25 MG (50000 UNIT) CAPS capsule Take 1 capsule (50,000 Units total) by mouth every 7 (seven) days. 04/24/20   Eber Jones, MD    Family History Family History  Problem Relation Age of Onset   Diabetes Maternal Aunt    Hypertension Maternal Uncle    Depression Maternal Grandfather    Diabetes Maternal Grandfather    Hypertension Maternal Grandfather    Arthritis Mother    High blood pressure Mother    Depression  Mother    Sleep apnea Mother    Obesity Mother    Diabetes Mother    Hypertension Mother    Sleep apnea Father    Obesity Father     Social History Social History   Tobacco Use   Smoking status: Never Smoker   Smokeless tobacco: Never Used  Scientific laboratory technician Use: Never used  Substance Use Topics   Alcohol use: Not Currently    Comment: social    Drug use: No     Allergies   Bee pollen, Bee venom, Hydrocodone-acetaminophen, Peach flavor, and Pollen extract   Review of Systems Review of Systems  Constitutional: Positive for chills, fatigue and fever. Negative for activity change and appetite change.  HENT: Positive for congestion and rhinorrhea. Negative for ear pain, sinus pressure, sore throat and trouble swallowing.   Eyes: Negative for discharge and redness.  Respiratory: Positive for cough and shortness of breath. Negative for chest tightness.   Cardiovascular: Negative for chest pain.  Gastrointestinal: Negative for abdominal pain, diarrhea, nausea and vomiting.  Musculoskeletal: Negative for myalgias.  Skin: Negative for rash.  Neurological: Positive for headaches. Negative for dizziness and light-headedness.     Physical Exam Triage Vital Signs ED Triage Vitals  Enc Vitals Group     BP 06/09/20 1548 (!) 134/94     Pulse Rate 06/09/20 1548 (!) 135     Resp 06/09/20 1548 (!) 34     Temp 06/09/20 1548 (!) 100.5 F (38.1 C)     Temp Source 06/09/20 1548 Oral     SpO2 06/09/20 1548 93 %     Weight --      Height --      Head Circumference --      Peak Flow --  Pain Score 06/09/20 1549 0     Pain Loc --      Pain Edu? --      Excl. in [place]? --    No data found.  Updated Vital Signs BP (!) 134/94 (BP Location: Left Arm)    Pulse (!) 135    Temp (!) 100.5 F (38.1 C) (Oral)    Resp (!) 34    LMP  (LMP Unknown)    SpO2 93%   Visual Acuity Right Eye Distance:   Left Eye Distance:   Bilateral Distance:    Right Eye Near:   Left Eye  Near:    Bilateral Near:     Physical Exam Vitals and nursing note reviewed.  Constitutional:      Appearance: She is well-developed.     Comments: No acute distress  HENT:     Head: Normocephalic and atraumatic.     Ears:     Comments: Bilateral ears without tenderness to palpation of external auricle, tragus and mastoid, EAC's without erythema or swelling, TM's with good bony landmarks and cone of light. Non erythematous.     Nose: Nose normal.     Mouth/Throat:     Comments: Oral mucosa pink and moist, no tonsillar enlargement or exudate. Posterior pharynx patent and nonerythematous, no uvula deviation or swelling. Normal phonation. Eyes:     Conjunctiva/sclera: Conjunctivae normal.  Cardiovascular:     Rate and Rhythm: Tachycardia present.  Pulmonary:     Effort: Pulmonary effort is normal. No respiratory distress.     Comments: Breathing comfortably at rest, CTABL, no wheezing, rales or other adventitious sounds auscultated Abdominal:     General: There is no distension.  Musculoskeletal:        General: Normal range of motion.     Cervical back: Neck supple.  Skin:    General: Skin is warm and dry.  Neurological:     Mental Status: She is alert and oriented to person, place, and time.      UC Treatments / Results  Labs (all labs ordered are listed, but only abnormal results are displayed) Labs Reviewed  SARS CORONAVIRUS 2 (TAT 6-24 HRS) - Abnormal; Notable for the following components:      Result Value   SARS Coronavirus 2 POSITIVE (*)    All other components within normal limits    EKG   Radiology DG Chest 2 View  Result Date: 06/09/2020 CLINICAL DATA:  Recent bronchitis.  Worsening for 3 days with fever. EXAM: CHEST - 2 VIEW COMPARISON:  May 30, 2020 FINDINGS: No pneumothorax. Suggested subtle opacities in the bases. Cardiomegaly. The hila and mediastinum are normal. No pneumothorax. No other acute abnormalities. IMPRESSION: Suggested subtle bibasilar  opacities, right greater than left, are concerning for pneumonia given the history of COVID-19 exposure and cough. Electronically Signed   By: Dorise Bullion III M.D   On: 06/09/2020 17:08    Procedures Procedures (including critical care time)  Medications Ordered in UC Medications - No data to display  Initial Impression / Assessment and Plan / UC Course  I have reviewed the triage vital signs and the nursing notes.  Pertinent labs & imaging results that were available during my care of the patient were reviewed by me and considered in my medical decision making (see chart for details).     X-ray suggestive of bilateral basilar pneumonia, suspicious of Covid pneumonia.  Ambulated patient to check O2 and remained stable at 95%, patient is significantly tachycardic  in the 130s at rest, increased to around 140 with ambulation.  Suspect some dehydration as symptoms had poor oral intake of recently as well.  Continue symptomatic and supportive care, stressed close monitoring of breathing and symptoms if symptoms continue to worsen to follow-up in emergency room for further evaluation and treatment.  Recommended to have low threshold to go to emergency room, O2 stable at this point and feel patient stable for discharge but will need to closely monitor breathing and symptoms.   Discussed strict return precautions. Patient verbalized understanding and is agreeable with plan.  Final Clinical Impressions(s) / UC Diagnoses   Final diagnoses:  Suspected COVID-19 virus infection  Pneumonia due to COVID-19 virus     Discharge Instructions     Xray is suggestive of COVID pneumonia Rest and fluids Tessalon for cough Mucinex DM for congestion/cough Follow up in emergency room if symptoms worsening    ED Prescriptions    Medication Sig Dispense Auth. Provider   benzonatate (TESSALON) 200 MG capsule Take 1 capsule (200 mg total) by mouth 3 (three) times daily as needed for up to 7 days for  cough. 28 capsule Wieters, Hallie C, PA-C   dextromethorphan-guaiFENesin (MUCINEX DM) 30-600 MG 12hr tablet Take 1 tablet by mouth 2 (two) times daily. 20 tablet Wieters, Hallie C, PA-C   ibuprofen (ADVIL) 800 MG tablet Take 1 tablet (800 mg total) by mouth 3 (three) times daily. 21 tablet Wieters, Hallie C, PA-C   ondansetron (ZOFRAN ODT) 4 MG disintegrating tablet Take 1 tablet (4 mg total) by mouth every 8 (eight) hours as needed for nausea or vomiting. 20 tablet Wieters, [place] C, PA-C     PDMP not reviewed this encounter.   Janith Lima, [place] 06/10/20 [phone]

## 2020-06-11 ENCOUNTER — Encounter: Payer: Self-pay | Admitting: Adult Health

## 2020-06-11 ENCOUNTER — Emergency Department (HOSPITAL_COMMUNITY): Payer: BC Managed Care – PPO

## 2020-06-11 ENCOUNTER — Inpatient Hospital Stay (HOSPITAL_COMMUNITY): Payer: BC Managed Care – PPO

## 2020-06-11 ENCOUNTER — Encounter (HOSPITAL_COMMUNITY): Payer: Self-pay | Admitting: Emergency Medicine

## 2020-06-11 ENCOUNTER — Other Ambulatory Visit: Payer: Self-pay

## 2020-06-11 ENCOUNTER — Inpatient Hospital Stay (HOSPITAL_COMMUNITY)
Admission: EM | Admit: 2020-06-11 | Discharge: 2020-06-15 | DRG: 177 | Disposition: A | Discharge status: Home / Self Care | Payer: BC Managed Care – PPO | Attending: Internal Medicine | Admitting: Internal Medicine

## 2020-06-11 DIAGNOSIS — F329 Major depressive disorder, single episode, unspecified: Secondary | ICD-10-CM | POA: Diagnosis present

## 2020-06-11 DIAGNOSIS — R112 Nausea with vomiting, unspecified: Secondary | ICD-10-CM | POA: Diagnosis present

## 2020-06-11 DIAGNOSIS — Z833 Family history of diabetes mellitus: Secondary | ICD-10-CM

## 2020-06-11 DIAGNOSIS — G43909 Migraine, unspecified, not intractable, without status migrainosus: Secondary | ICD-10-CM | POA: Diagnosis present

## 2020-06-11 DIAGNOSIS — I10 Essential (primary) hypertension: Secondary | ICD-10-CM | POA: Diagnosis not present

## 2020-06-11 DIAGNOSIS — K219 Gastro-esophageal reflux disease without esophagitis: Secondary | ICD-10-CM | POA: Diagnosis present

## 2020-06-11 DIAGNOSIS — G43809 Other migraine, not intractable, without status migrainosus: Secondary | ICD-10-CM | POA: Diagnosis not present

## 2020-06-11 DIAGNOSIS — D509 Iron deficiency anemia, unspecified: Secondary | ICD-10-CM | POA: Diagnosis not present

## 2020-06-11 DIAGNOSIS — U071 COVID-19: Principal | ICD-10-CM | POA: Diagnosis present

## 2020-06-11 DIAGNOSIS — R197 Diarrhea, unspecified: Secondary | ICD-10-CM | POA: Diagnosis present

## 2020-06-11 DIAGNOSIS — J9601 Acute respiratory failure with hypoxia: Secondary | ICD-10-CM | POA: Diagnosis not present

## 2020-06-11 DIAGNOSIS — Z8249 Family history of ischemic heart disease and other diseases of the circulatory system: Secondary | ICD-10-CM | POA: Diagnosis not present

## 2020-06-11 DIAGNOSIS — Z79899 Other long term (current) drug therapy: Secondary | ICD-10-CM

## 2020-06-11 DIAGNOSIS — R0602 Shortness of breath: Secondary | ICD-10-CM | POA: Diagnosis not present

## 2020-06-11 DIAGNOSIS — Z6841 Body Mass Index (BMI) 40.0 and over, adult: Secondary | ICD-10-CM | POA: Diagnosis not present

## 2020-06-11 DIAGNOSIS — T783XXA Angioneurotic edema, initial encounter: Secondary | ICD-10-CM | POA: Diagnosis not present

## 2020-06-11 DIAGNOSIS — T375X5A Adverse effect of antiviral drugs, initial encounter: Secondary | ICD-10-CM | POA: Diagnosis not present

## 2020-06-11 DIAGNOSIS — E66813 Obesity, class 3: Secondary | ICD-10-CM

## 2020-06-11 DIAGNOSIS — E282 Polycystic ovarian syndrome: Secondary | ICD-10-CM | POA: Diagnosis present

## 2020-06-11 DIAGNOSIS — F32A Depression, unspecified: Secondary | ICD-10-CM | POA: Diagnosis present

## 2020-06-11 DIAGNOSIS — G4733 Obstructive sleep apnea (adult) (pediatric): Secondary | ICD-10-CM | POA: Diagnosis present

## 2020-06-11 LAB — COMPREHENSIVE METABOLIC PANEL
ALT: 37 U/L (ref 0–44)
AST: 53 U/L — ABNORMAL HIGH (ref 15–41)
Albumin: 3.5 g/dL (ref 3.5–5.0)
Alkaline Phosphatase: 51 U/L (ref 38–126)
Anion gap: 12 (ref 5–15)
BUN: <5 mg/dL — ABNORMAL LOW (ref 6–20)
CO2: 23 mmol/L (ref 22–32)
Calcium: 8.6 mg/dL — ABNORMAL LOW (ref 8.9–10.3)
Chloride: 102 mmol/L (ref 98–111)
Creatinine, Ser: 0.77 mg/dL (ref 0.44–1.00)
GFR calc Af Amer: >60 mL/min (ref >60)
GFR calc non Af Amer: >60 mL/min (ref >60)
Glucose, Bld: 124 mg/dL — ABNORMAL HIGH (ref 70–99)
Potassium: 3.7 mmol/L (ref 3.5–5.1)
Sodium: 137 mmol/L (ref 135–145)
Total Bilirubin: 0.5 mg/dL (ref 0.3–1.2)
Total Protein: 7.7 g/dL (ref 6.5–8.1)

## 2020-06-11 LAB — CBC WITH DIFFERENTIAL/PLATELET
Abs Immature Granulocytes: 0.01 10*3/uL (ref 0.00–0.07)
Basophils Absolute: 0 10*3/uL (ref 0.0–0.1)
Basophils Relative: 0 %
Eosinophils Absolute: 0 10*3/uL (ref 0.0–0.5)
Eosinophils Relative: 0 %
HCT: 40.1 % (ref 36.0–46.0)
Hemoglobin: 12.1 g/dL (ref 12.0–15.0)
Immature Granulocytes: 0 %
Lymphocytes Relative: 16 %
Lymphs Abs: 0.8 10*3/uL (ref 0.7–4.0)
MCH: 21.8 pg — ABNORMAL LOW (ref 26.0–34.0)
MCHC: 30.2 g/dL (ref 30.0–36.0)
MCV: 72.4 fL — ABNORMAL LOW (ref 80.0–100.0)
Monocytes Absolute: 0.1 10*3/uL (ref 0.1–1.0)
Monocytes Relative: 2 %
Neutro Abs: 3.9 10*3/uL (ref 1.7–7.7)
Neutrophils Relative %: 82 %
Platelets: 206 10*3/uL (ref 150–400)
RBC: 5.54 MIL/uL — ABNORMAL HIGH (ref 3.87–5.11)
RDW: 15.8 % — ABNORMAL HIGH (ref 11.5–15.5)
WBC: 4.8 10*3/uL (ref 4.0–10.5)
nRBC: 0 % (ref 0.0–0.2)

## 2020-06-11 LAB — HEPATITIS B SURFACE ANTIGEN: Hepatitis B Surface Ag: NONREACTIVE

## 2020-06-11 LAB — PROCALCITONIN: Procalcitonin: 1.01 ng/mL

## 2020-06-11 LAB — TROPONIN I (HIGH SENSITIVITY)
Troponin I (High Sensitivity): 4 ng/L (ref <18)
Troponin I (High Sensitivity): <2 ng/L (ref <18)

## 2020-06-11 LAB — TRIGLYCERIDES: Triglycerides: 53 mg/dL (ref <150)

## 2020-06-11 LAB — I-STAT BETA HCG BLOOD, ED (MC, WL, AP ONLY): I-stat hCG, quantitative: <5 m[IU]/mL (ref <5)

## 2020-06-11 LAB — FERRITIN: Ferritin: 103 ng/mL (ref 11–307)

## 2020-06-11 LAB — BRAIN NATRIURETIC PEPTIDE: B Natriuretic Peptide: 10.9 pg/mL (ref 0.0–100.0)

## 2020-06-11 LAB — D-DIMER, QUANTITATIVE: D-Dimer, Quant: 0.61 ug/mL-FEU — ABNORMAL HIGH (ref 0.00–0.50)

## 2020-06-11 LAB — FIBRINOGEN: Fibrinogen: 764 mg/dL — ABNORMAL HIGH (ref 210–475)

## 2020-06-11 LAB — LACTATE DEHYDROGENASE: LDH: 297 U/L — ABNORMAL HIGH (ref 98–192)

## 2020-06-11 LAB — LACTIC ACID, PLASMA: Lactic Acid, Venous: 0.9 mmol/L (ref 0.5–1.9)

## 2020-06-11 LAB — C-REACTIVE PROTEIN: CRP: 9 mg/dL — ABNORMAL HIGH (ref <1.0)

## 2020-06-11 MED ORDER — TOPIRAMATE 25 MG PO TABS
50.0000 mg | ORAL_TABLET | Freq: Two times a day (BID) | ORAL | Status: DC
  Administered 2020-06-11 – 2020-06-15 (×9): 50 mg via ORAL
  Filled 2020-06-11 (×8): qty 2

## 2020-06-11 MED ORDER — DEXAMETHASONE SODIUM PHOSPHATE 10 MG/ML IJ SOLN
10.0000 mg | Freq: Once | INTRAMUSCULAR | Status: AC
  Administered 2020-06-11: 10 mg via INTRAVENOUS
  Filled 2020-06-11: qty 1

## 2020-06-11 MED ORDER — AMLODIPINE BESYLATE 10 MG PO TABS
10.0000 mg | ORAL_TABLET | Freq: Every day | ORAL | Status: DC
  Administered 2020-06-11 – 2020-06-15 (×5): 10 mg via ORAL
  Filled 2020-06-11: qty 1
  Filled 2020-06-11 (×2): qty 2
  Filled 2020-06-11 (×2): qty 1

## 2020-06-11 MED ORDER — ENOXAPARIN SODIUM 100 MG/ML ~~LOC~~ SOLN
100.0000 mg | SUBCUTANEOUS | Status: DC
  Administered 2020-06-11 – 2020-06-14 (×4): 100 mg via SUBCUTANEOUS
  Filled 2020-06-11 (×6): qty 1

## 2020-06-11 MED ORDER — ONDANSETRON HCL 4 MG/2ML IJ SOLN
4.0000 mg | Freq: Four times a day (QID) | INTRAMUSCULAR | Status: DC | PRN
  Administered 2020-06-12: 4 mg via INTRAVENOUS
  Filled 2020-06-11 (×2): qty 2

## 2020-06-11 MED ORDER — ASCORBIC ACID 500 MG PO TABS
500.0000 mg | ORAL_TABLET | Freq: Every day | ORAL | Status: DC
  Administered 2020-06-11 – 2020-06-15 (×5): 500 mg via ORAL
  Filled 2020-06-11 (×5): qty 1

## 2020-06-11 MED ORDER — ONDANSETRON HCL 4 MG PO TABS: 4.0000 mg | ORAL_TABLET | Freq: Four times a day (QID) | ORAL | Status: DC | PRN

## 2020-06-11 MED ORDER — BACLOFEN 10 MG PO TABS
10.0000 mg | ORAL_TABLET | Freq: Two times a day (BID) | ORAL | Status: DC | PRN
  Administered 2020-06-13 – 2020-06-14 (×2): 10 mg via ORAL
  Filled 2020-06-11 (×3): qty 1

## 2020-06-11 MED ORDER — SODIUM CHLORIDE 0.9 % IV SOLN
100.0000 mg | Freq: Every day | INTRAVENOUS | Status: DC
  Administered 2020-06-12 – 2020-06-13 (×2): 100 mg via INTRAVENOUS
  Filled 2020-06-11 (×2): qty 20

## 2020-06-11 MED ORDER — FERROUS SULFATE 325 (65 FE) MG PO TABS
325.0000 mg | ORAL_TABLET | ORAL | Status: DC
  Administered 2020-06-11 – 2020-06-15 (×3): 325 mg via ORAL
  Filled 2020-06-11 (×4): qty 1

## 2020-06-11 MED ORDER — IBUPROFEN 400 MG PO TABS
600.0000 mg | ORAL_TABLET | Freq: Once | ORAL | Status: AC
  Administered 2020-06-11: 600 mg via ORAL
  Filled 2020-06-11: qty 1

## 2020-06-11 MED ORDER — ALBUTEROL SULFATE HFA 108 (90 BASE) MCG/ACT IN AERS
2.0000 | INHALATION_SPRAY | Freq: Once | RESPIRATORY_TRACT | Status: AC
  Administered 2020-06-11: 2 via RESPIRATORY_TRACT
  Filled 2020-06-11: qty 6.7

## 2020-06-11 MED ORDER — ACETAMINOPHEN 325 MG PO TABS
650.0000 mg | ORAL_TABLET | Freq: Four times a day (QID) | ORAL | Status: DC | PRN
  Administered 2020-06-12 – 2020-06-14 (×5): 650 mg via ORAL
  Filled 2020-06-11 (×5): qty 2

## 2020-06-11 MED ORDER — HYDROCHLOROTHIAZIDE 25 MG PO TABS
25.0000 mg | ORAL_TABLET | Freq: Every day | ORAL | Status: DC
  Administered 2020-06-11 – 2020-06-15 (×5): 25 mg via ORAL
  Filled 2020-06-11 (×5): qty 1

## 2020-06-11 MED ORDER — IOHEXOL 350 MG/ML SOLN
75.0000 mL | Freq: Once | INTRAVENOUS | Status: AC | PRN
  Administered 2020-06-11: 75 mL via INTRAVENOUS

## 2020-06-11 MED ORDER — ZINC SULFATE 220 (50 ZN) MG PO CAPS
220.0000 mg | ORAL_CAPSULE | Freq: Every day | ORAL | Status: DC
  Administered 2020-06-11 – 2020-06-15 (×5): 220 mg via ORAL
  Filled 2020-06-11 (×5): qty 1

## 2020-06-11 MED ORDER — DIPHENHYDRAMINE HCL 25 MG PO CAPS
25.0000 mg | ORAL_CAPSULE | Freq: Three times a day (TID) | ORAL | Status: DC | PRN
  Administered 2020-06-11 – 2020-06-12 (×2): 25 mg via ORAL
  Filled 2020-06-11 (×2): qty 1

## 2020-06-11 MED ORDER — SODIUM CHLORIDE 0.9 % IV SOLN
200.0000 mg | Freq: Once | INTRAVENOUS | Status: AC
  Administered 2020-06-11: 200 mg via INTRAVENOUS
  Filled 2020-06-11: qty 40

## 2020-06-11 MED ORDER — PANTOPRAZOLE SODIUM 40 MG PO TBEC
40.0000 mg | DELAYED_RELEASE_TABLET | Freq: Every day | ORAL | Status: DC
  Administered 2020-06-11 – 2020-06-15 (×5): 40 mg via ORAL
  Filled 2020-06-11 (×5): qty 1

## 2020-06-11 MED ORDER — METHYLPREDNISOLONE SODIUM SUCC 125 MG IJ SOLR
0.5000 mg/kg | Freq: Two times a day (BID) | INTRAMUSCULAR | Status: DC
  Administered 2020-06-11 – 2020-06-15 (×9): 100 mg via INTRAVENOUS
  Filled 2020-06-11 (×9): qty 2

## 2020-06-11 MED ORDER — ALBUTEROL SULFATE HFA 108 (90 BASE) MCG/ACT IN AERS
2.0000 | INHALATION_SPRAY | Freq: Four times a day (QID) | RESPIRATORY_TRACT | Status: DC
  Administered 2020-06-11 – 2020-06-15 (×17): 2 via RESPIRATORY_TRACT
  Filled 2020-06-11 (×3): qty 6.7

## 2020-06-11 NOTE — H&P (Signed)
History and Physical    Maureen Lewis QBV:[id number] DOB: Dec 03, 1995 DOA: 06/11/2020  PCP: Dorothyann Peng, NP  Patient coming from: home I have personally briefly reviewed patient's old medical records in [place]  Chief Complaint: Worsening shortness of breath, tested positive for COVID-19 2 days ago  HPI: Maureen Lewis is a 24 y.o. female with medical history significant of hypertension, migraine headache, morbid obesity with BMI of 59, GERD, microcytic anemia presents to emergency department with worsening shortness of breath since this morning.  Patient tells me that she has shortness of breath, vomiting, loose stool, not feeling well overall since 5 days however this morning she was not able to catch her breath therefore she decided to come to the ER for further evaluation and management.  She tested positive for COVID-19 at urgent care 2 days ago.  Reports chest pain especially when she coughs, nonbloody loose stool 1 episode per day associated with crampy abdominal pain and decreased appetite however she denies wheezing, fever, chills, generalized weakness, lethargy.  She tells me that her 65-monthold daughter, mom tested positive for COVID-19.  No history of smoking, alcohol, illicit drug use.  She is not vaccinated against COVID-19.  ED Course: Upon arrival to ED: Patient tachycardic, hypoxic requiring 4 L of oxygen via nasal cannula, afebrile, no leukocytosis, lactic acid: WNL.  D-dimer: 0.61, inflammatory markers pending.  Chest x-ray shows multifocal pneumonia.  Patient received Decadron, albuterol and ibuprofen in ED.  [facility] hospitalist consulted for admission for acute hypoxemic respiratory failure secondary to COVID-19.  Review of Systems: As per HPI otherwise negative.    Past Medical History:  Diagnosis Date  . Abnormal uterine bleeding   . Acid reflux   . Alpha thalassemia trait   . Amenorrhea   . Anemia    no current med.  . Back pain   . Cesarean delivery  delivered 10/11/2019   10/09/2019 - primary CS for failed IOL  . Chest pain   . Complication of anesthesia    states had to keep giving her anesthesia during EGD  . Constipation   . Depression    "I'm good"  . Finger mass, left 03/2017   middle finger  . Gestational diabetes   . Gestational diabetes 05/23/2019   Current Diabetic Medications:  None  _0  Aspirin 81 mg daily after 12 weeks (? A2/B GDM)  Required Referrals for A1GDM or A2GDM: _1  Diabetes Education and Testing Supplies _2  Nutrition Cousult  For A2/B GDM or higher classes of DM _3  Diabetes Education and Testing Supplies _4  Nutrition Counsult _5  Fetal ECHO after 20 weeks  _6  Eye exam for retina evaluation   Baseline and surveillance labs (  . Gestational hypertension 09/23/2019   Guidelines for Antenatal Testing and Sonography  (with updated ICD-10 codes)  Updated  12020/11/21with Dr. RTama High INDICATION U/S 2 X week NST/AFI  or full BPP wkly DELIVERY Diabetes   A1 - good control - O24.410    A2 - good control - O24.419      A2  - poor control or poor compliance - O24.419, E11.65   (Macrosomia or polyhydramnios) **E11.65 is extra code for poor control**    A2/B - O24.  .Marland KitchenHeadache   . Hypertension   . Infection    UTI  . Irritable bowel syndrome (IBS)   . Joint pain   . Morbid obesity with body mass index (BMI) of 45.0 to 49.9 in adult (  [place])   . Obesity during pregnancy, antepartum 04/06/2019   Body mass index is 53.71 kg/m.  Recommendations _0  Aspirin 81 mg daily after 12 weeks; discontinue after 36 weeks _1  Nutrition consult _2  Weight gain 11-20 lbs for singleton and 25-35 lbs for twin pregnancy (IOM guidelines) Higher class of obesity patients recommended to gain closer to lower limit  Weight loss is associated with adverse outcomes _3  Baseline and surveillance labs (pulled in fr  . Plantar fasciitis, bilateral   . Polycystic ovary syndrome   . Prediabetes    "prediabetes"  . Pregnancy induced hypertension   .  Shortness of breath   . Sleep apnea    no CPAP use  . Supervision of normal first pregnancy 04/06/2019   [name]: [ x]Initial [ x]12 _4 20 _5 28 _6 32 _7 36 _8 38 _9 39 _10 40 Nursing Staff Provider Office Location [facility]  Dating   LMP Language  English  Anatomy US  Nml Flu Vaccine  Declined 07/28/19 Genetic Screen  NIPS: low risks   AFP:   negative  TDaP vaccine   08/25/19 Hgb A1C or  GTT Early A1C 5.8 Third trimester: GDM insulin Rhogam  NA   LAB RESULTS  Feeding Plan Breast Blood Type  . Vertigo 2017    Past Surgical History:  Procedure Laterality Date  . CESAREAN SECTION N/A 10/09/2019   Procedure: CESAREAN SECTION;  Surgeon: Aletha Halim, MD;  Location: [facility];  Service: Obstetrics;  Laterality: N/A;  . COLONOSCOPY WITH PROPOFOL  10/07/2016  . ESOPHAGOGASTRODUODENOSCOPY  12/21/2015  . EXCISION MASS UPPER EXTREMETIES Left 04/21/2017   Procedure: EXCISION MASS LEFT MIDDLE FINGER;  Surgeon: Daryll Brod, MD;  Location: [place];  Service: Orthopedics;  Laterality: Left;  . IR FL GUIDED LOC OF NEEDLE/CATH TIP FOR SPINAL INJECTION RT  03/13/2020     reports that she has never smoked. She has never used smokeless tobacco. She reports previous alcohol use. She reports that she does not use drugs.  Allergies  Allergen Reactions  . Bee Pollen Hives, Shortness Of Breath and Swelling  . Bee Venom Hives, Shortness Of Breath and Swelling  . Hydrocodone-Acetaminophen Anaphylaxis    No problem when she takes Tylenol  . Peach Flavor Hives, Shortness Of Breath and Other (See Comments)    SWELLING OF MOUTH  . Pollen Extract Hives, Shortness Of Breath and Swelling    Family History  Problem Relation Age of Onset  . Diabetes Maternal Aunt   . Hypertension Maternal Uncle   . Depression Maternal Grandfather   . Diabetes Maternal Grandfather   . Hypertension Maternal Grandfather   . Arthritis Mother   . High blood pressure Mother   . Depression Mother   . Sleep  apnea Mother   . Obesity Mother   . Diabetes Mother   . Hypertension Mother   . Sleep apnea Father   . Obesity Father     Prior to Admission medications   Medication Sig Start Date End Date Taking? Authorizing Provider  amLODipine (NORVASC) 10 MG tablet Take 1 tablet (10 mg total) by mouth daily. 10/27/19   Lavonia Drafts, MD  baclofen (LIORESAL) 10 MG tablet Take 1 tablet (10 mg total) by mouth 2 (two) times daily. 02/28/20   Nafziger, Tommi Rumps, NP  benzonatate (TESSALON) 200 MG capsule Take 1 capsule (200 mg total) by mouth 3 (three) times daily as needed for up to 7 days for cough. 06/09/20 06/16/20  Wieters,  Hallie C, PA-C  Blood Pressure Monitoring KIT 1 kit by Does not apply route once a week. 05/05/19   Sloan Leiter, MD  dextromethorphan-guaiFENesin [facility] DM) 30-600 MG 12hr tablet Take 1 tablet by mouth 2 (two) times daily. 06/09/20   Wieters, Hallie C, PA-C  ferrous sulfate 325 (65 FE) MG tablet Take 1 tablet (325 mg total) by mouth every other day. 02/23/20 05/23/20  Nafziger, Tommi Rumps, NP  hydrochlorothiazide (HYDRODIURIL) 25 MG tablet Take 1 tablet (25 mg total) by mouth daily. 10/27/19   Lavonia Drafts, MD  ibuprofen (ADVIL) 800 MG tablet Take 1 tablet (800 mg total) by mouth 3 (three) times daily. 06/09/20   Wieters, Hallie C, PA-C  loratadine (CLARITIN) 10 MG tablet Take 10 mg by mouth daily as needed for allergies.     [provider]  ondansetron (ZOFRAN ODT) 4 MG disintegrating tablet Take 1 tablet (4 mg total) by mouth every 8 (eight) hours as needed for nausea or vomiting. 06/09/20   Wieters, Hallie C, PA-C  pantoprazole (PROTONIX) 40 MG tablet Take 1 tablet (40 mg total) by mouth daily. 05/04/20   Nafziger, Tommi Rumps, NP  polyethylene glycol powder (GLYCOLAX/MIRALAX) 17 GM/SCOOP powder  10/11/19   [provider]  Semaglutide-Weight Management (WEGOVY) 0.5 MG/0.5ML SOAJ Inject 0.5 mg into the skin once a week. 06/06/20   Eber Jones, MD  sucralfate  (CARAFATE) 1 GM/10ML suspension Take 10 mLs (1 g total) by mouth 4 (four) times daily -  with meals and at bedtime. 05/04/20   Nafziger, Tommi Rumps, NP  topiramate (TOPAMAX) 25 MG tablet Take 2 tablets (50 mg total) by mouth 2 (two) times daily. 02/23/20   Dohmeier, Asencion Partridge, MD  Vitamin D, Ergocalciferol, (DRISDOL) 1.25 MG (50000 UNIT) CAPS capsule Take 1 capsule (50,000 Units total) by mouth every 7 (seven) days. 04/24/20   Eber Jones, MD    Physical Exam: Vitals:   06/11/20 1158 06/11/20 1159 06/11/20 1200  Pulse:  (!) 133 (!) 124  SpO2:  (!) 87% 95%  Weight: (!) 200 kg    Height: 6' (1.829 m)      Constitutional: NAD, calm, comfortable, on 4 L of oxygen via nasal cannula, obese Eyes: PERRL, lids and conjunctivae normal ENMT: Mucous membranes are moist. Posterior pharynx clear of any exudate or lesions.Normal dentition.  Neck: normal, supple, no masses, no thyromegaly Respiratory: clear to auscultation bilaterally, no wheezing, no crackles. Normal respiratory effort. No accessory muscle use.  Cardiovascular: Tachycardic, no murmurs / rubs / gallops. No extremity edema. 2+ pedal pulses. No carotid bruits.  Abdomen: no tenderness, no masses palpated. No hepatosplenomegaly. Bowel sounds positive.  Musculoskeletal: no clubbing / cyanosis. No joint deformity upper and lower extremities. Good ROM, no contractures. Normal muscle tone.  Skin: no rashes, lesions, ulcers. No induration Neurologic: CN 2-12 grossly intact. Sensation intact, DTR normal. Strength 5/5 in all 4.  Psychiatric: Normal judgment and insight. Alert and oriented x 3. Normal mood.    Labs on Admission: I have personally reviewed following labs and imaging studies  CBC: Recent Labs  Lab 06/11/20 1220  WBC 4.8  NEUTROABS 3.9  HGB 12.1  HCT 40.1  MCV 72.4*  PLT 676   Basic Metabolic Panel: Recent Labs  Lab 06/11/20 1220  NA 137  K 3.7  CL 102  CO2 23  GLUCOSE 124*  BUN <5*  CREATININE 0.77  CALCIUM 8.6*    GFR: Estimated Creatinine Clearance: 217.6 mL/min (by C-G formula based on SCr of 0.77  mg/dL). Liver Function Tests: Recent Labs  Lab 06/11/20 1220  AST 53*  ALT 37  ALKPHOS 51  BILITOT 0.5  PROT 7.7  ALBUMIN 3.5   No results for input(s): LIPASE, AMYLASE in the last 168 hours. No results for input(s): AMMONIA in the last 168 hours. Coagulation Profile: No results for input(s): INR, PROTIME in the last 168 hours. Cardiac Enzymes: No results for input(s): CKTOTAL, CKMB, CKMBINDEX, TROPONINI in the last 168 hours. BNP (last 3 results) No results for input(s): PROBNP in the last 8760 hours. HbA1C: No results for input(s): HGBA1C in the last 72 hours. CBG: No results for input(s): GLUCAP in the last 168 hours. Lipid Profile: Recent Labs    06/11/20 1220  TRIG 53   Thyroid Function Tests: No results for input(s): TSH, T4TOTAL, FREET4, T3FREE, THYROIDAB in the last 72 hours. Anemia Panel: Recent Labs    06/11/20 1220  FERRITIN 103   Urine analysis:    Component Value Date/Time   COLORURINE YELLOW 02/21/2020 1406   APPEARANCEUR CLEAR 02/21/2020 1406   LABSPEC 1.025 02/21/2020 1406   PHURINE 6.5 02/21/2020 1406   GLUCOSEU NEGATIVE 02/21/2020 1406   HGBUR NEGATIVE 02/21/2020 1406   HGBUR small 11/29/2008 1537   BILIRUBINUR NEGATIVE 02/21/2020 1406   BILIRUBINUR neg 03/30/2018 1745   KETONESUR NEGATIVE 02/21/2020 1406   PROTEINUR NEGATIVE 09/26/2019 1222   UROBILINOGEN 1.0 02/21/2020 1406   NITRITE NEGATIVE 02/21/2020 1406   [place] 02/21/2020 1406    Radiological Exams on Admission: DG Chest 2 View  Result Date: 06/09/2020 CLINICAL DATA:  Recent bronchitis.  Worsening for 3 days with fever. EXAM: CHEST - 2 VIEW COMPARISON:  May 30, 2020 FINDINGS: No pneumothorax. Suggested subtle opacities in the bases. Cardiomegaly. The hila and mediastinum are normal. No pneumothorax. No other acute abnormalities. IMPRESSION: Suggested subtle bibasilar  opacities, right greater than left, are concerning for pneumonia given the history of COVID-19 exposure and cough. Electronically Signed   By: Dorise Bullion III M.D   On: 06/09/2020 17:08   DG Chest Port 1 View  Result Date: 06/11/2020 CLINICAL DATA:  Shortness of breath. EXAM: PORTABLE CHEST 1 VIEW COMPARISON:  June 09, 2020. FINDINGS: Stable cardiomediastinal silhouette. No pneumothorax pleural effusion is noted. Stable bilateral lung opacities are noted, right greater than left, concerning for multifocal pneumonia. Bony thorax is unremarkable. IMPRESSION: Stable bilateral lung opacities, right greater than left, concerning for multifocal pneumonia. Electronically Signed   By: Marijo Conception M.D.   On: 06/11/2020 12:36    EKG: Independently reviewed.  Sinus tachycardia, no ST elevation or depression noted.  Assessment/Plan Principal Problem:   Acute hypoxemic respiratory failure due to COVID-19 [facility]) Active Problems:   GERD (gastroesophageal reflux disease)   OSA (obstructive sleep apnea)   Depression   Class 3 severe obesity with serious comorbidity and body mass index (BMI) of 50.0 to 59.9 in adult ([facility])   Microcytic anemia   Migraine headache   HTN (hypertension)    Acute hypoxemic respiratory failure due to COVID-19 pneumonia: -Patient tested positive for COVID-19 2 days ago.  She is tachycardic, hypoxic requiring 4 L of oxygen via nasal cannula.  Reviewed chest x-ray.  No leukocytosis.  Lactic acid: WNL.  Received Decadron and albuterol breathing treatment in ED. -Admit patient on the floor.  On continuous pulse ox.  We will try to wean off of oxygen as tolerated. -Start patient on remdesivir and Solu-Medrol. -Repeat inflammatory markers tomorrow AM. -Pt is tachycardic, hypoxic, morbid obese &  elevated d dimer: will get CTA of chest to r/o PE. -Patient was told that if COVID-19 pneumonitis gets worse we might potentially use Actemra off label, she denies known history of  hepatitis or tuberculosis and wants to proceed with Actemra if required. -Monitor vitals closely.  Hypertension: Stable -Continue amlodipine, HCTZ.  Monitor blood pressure closely  Microcytic anemia: -H&H is stable.  Continue ferrous sulfate  GERD: Continue PPI  Migraine headache: Continue Topamax  Morbid obesity with BMI of 59: -Encourage dietary modification, exercise and weight loss.   DVT prophylaxis: Lovenox/SCD Code Status: Full code Family Communication: None present at bedside.  Plan of care discussed with patient in length and she verbalized understanding and agreed with it. Disposition Plan: Home in 3 to 4 days Consults called: None Admission status: Inpatient   Mckinley Jewel MD [facility]  If 7PM-7AM, please contact night-coverage [email or web address] Password [email or web address]  06/11/2020, 1:32 PM

## 2020-06-11 NOTE — ED Notes (Signed)
Patient transported to CT 

## 2020-06-11 NOTE — ED Triage Notes (Signed)
Pt went to [facility] Friday and tested positive for covid. UC did chest xr, positive for pnx. Fire noted RA sats read 86%, put on NRB and then Bode at 4L sats came up to 96%.

## 2020-06-11 NOTE — ED Provider Notes (Signed)
[facility] EMERGENCY DEPARTMENT Provider Note   CSN: [id number] Arrival date & time: 06/11/20  1151     History Chief Complaint  Patient presents with  . Shortness of Breath    Maureen Lewis is a 24 y.o. female.  HPI   24 year old female with a history of GERD, alpha thalassemia trait, anemia, hypertension, IBS, morbid obesity, PCOS, who presents the emergency department today for evaluation of shortness of breath.  Patient became symptomatic of Covid about a week ago and started experiencing cough.  She was tested for Covid 2 days ago and results came back positive.  She states symptoms worsened about 3 days ago when she started becoming shortness of breath and having chest pain that is worse when she coughs.  She denies any hemoptysis or bilateral lower extremity swelling.  She does report associated nausea vomiting and diarrhea.  She has not been vaccinated.  She is been taking Tylenol for her symptoms but otherwise is not taking any other medication.  EMS noted patient to be hypoxic at 86% on room air.  She was placed on 4 L O2 and sats improved into the mid 90s.  Past Medical History:  Diagnosis Date  . Abnormal uterine bleeding   . Acid reflux   . Alpha thalassemia trait   . Amenorrhea   . Anemia    no current med.  . Back pain   . Cesarean delivery delivered 10/11/2019   10/09/2019 - primary CS for failed IOL  . Chest pain   . Complication of anesthesia    states had to keep giving her anesthesia during EGD  . Constipation   . Depression    "I'm good"  . Finger mass, left 03/2017   middle finger  . Gestational diabetes   . Gestational diabetes 05/23/2019   Current Diabetic Medications:  None  $Rem'[ ]'jEYU$  Aspirin 81 mg daily after 12 weeks (? A2/B GDM)  Required Referrals for A1GDM or A2GDM: $RemoveB'[ ]'GtODsqhT$  Diabetes Education and Testing Supplies $RemoveBeforeDE'[ ]'WAClNGfJiJzltUR$  Nutrition Cousult  For A2/B GDM or higher classes of DM $Rem'[ ]'GAoZ$  Diabetes Education and Testing Supplies $RemoveBeforeDE'[ ]'ytJUuVQnWfrUHTT$  Nutrition Counsult  $RemoveB'[ ]'PoqwynMY$  Fetal ECHO after 20 weeks  $Remo'[ ]'fzAek$  Eye exam for retina evaluation   Baseline and surveillance labs (  . Gestational hypertension 09/23/2019   Guidelines for Antenatal Testing and Sonography  (with updated ICD-10 codes)  Updated  Sep 27, 2019 with Dr. Tama High  INDICATION U/S 2 X week NST/AFI  or full BPP wkly DELIVERY Diabetes   A1 - good control - O24.410    A2 - good control - O24.419      A2  - poor control or poor compliance - O24.419, E11.65   (Macrosomia or polyhydramnios) **E11.65 is extra code for poor control**    A2/B - O24.  Marland Kitchen Headache   . Hypertension   . Infection    UTI  . Irritable bowel syndrome (IBS)   . Joint pain   . Morbid obesity with body mass index (BMI) of 45.0 to 49.9 in adult [facility])   . Obesity during pregnancy, antepartum 04/06/2019   Body mass index is 53.71 kg/m.  Recommendations $RemoveBeforeDE'[x]'ooaiMjwyxIrthVP$  Aspirin 81 mg daily after 12 weeks; discontinue after 36 weeks $Remove'[ ]'jzqeeke$  Nutrition consult $RemoveBeforeDEI'[ ]'bNLhmhjLfeMjiiJU$  Weight gain 11-20 lbs for singleton and 25-35 lbs for twin pregnancy (IOM guidelines) Higher class of obesity patients recommended to gain closer to lower limit  Weight loss is associated with adverse outcomes $RemoveBeforeDE'[ ]'rBSbnOspHvSOzeW$  Baseline and  surveillance labs (pulled in fr  . Plantar fasciitis, bilateral   . Polycystic ovary syndrome   . Prediabetes    "prediabetes"  . Pregnancy induced hypertension   . Shortness of breath   . Sleep apnea    no CPAP use  . Supervision of normal first pregnancy 04/06/2019   [name]: [ x]Initial [ x]12 $Remo'[ ]'MqpIp$ 20 $Remo'[ ]'BEvch$ 28 $Remo'[ ]'IRAUC$ 32 $Remo'[ ]'WXGzM$ 36 $Remo'[ ]'MSWEt$ 38 $Remo'[ ]'mpuTI$ 39 $Remo'[ ]'qHuwe$ 40 Nursing Staff Provider Office Location [facility]  Dating   LMP Language  English  Anatomy US  Nml Flu Vaccine  Declined 07/28/19 Genetic Screen  NIPS: low risks   AFP:   negative  TDaP vaccine   08/25/19 Hgb A1C or  GTT Early A1C 5.8 Third trimester: GDM insulin Rhogam  NA   LAB RESULTS  Feeding Plan Breast Blood Type  . Vertigo 2017    Patient Active Problem List   Diagnosis Date Noted  . Acute hypoxemic respiratory failure due  to COVID-19 ([place]) 06/11/2020  . Hx of migraine headaches 04/09/2020  . Postpartum care following cesarean delivery 11/22/2019  . Contraceptive management 11/22/2019  . Positive RPR test 10/09/2019  . HTN (hypertension) 09/23/2019  . Migraine headache 07/12/2019  . Alpha thalassemia trait   . Vitamin D deficiency 09/16/2017  . Prediabetes 09/16/2017  . Depression 09/16/2017  . Class 3 severe obesity with serious comorbidity and body mass index (BMI) of 50.0 to 59.9 in adult ([place]) 09/16/2017  . PCOS (polycystic ovarian syndrome) 08/13/2017  . Mass 05/06/2017  . Vertigo 10/06/2016  . Microcytic anemia 09/26/2016  . Pseudotumor cerebri syndrome 08/28/2016  . Obesity hypoventilation syndrome ([place]) 08/28/2016  . Benign paroxysmal positional vertigo due to bilateral vestibular disorder 08/28/2016  . OSA (obstructive sleep apnea) 08/28/2016  . Hypersomnia with sleep apnea 08/28/2016  . Female hirsutism 08/28/2016  . GERD (gastroesophageal reflux disease) 02/24/2015  . Constipation 10/02/2014    Past Surgical History:  Procedure Laterality Date  . CESAREAN SECTION N/A 10/09/2019   Procedure: CESAREAN SECTION;  Surgeon: Aletha Halim, MD;  Location: [facility] LD [facility]S;  Service: Obstetrics;  Laterality: N/A;  . COLONOSCOPY WITH PROPOFOL  10/07/2016  . ESOPHAGOGASTRODUODENOSCOPY  12/21/2015  . EXCISION MASS UPPER EXTREMETIES Left 04/21/2017   Procedure: EXCISION MASS LEFT MIDDLE FINGER;  Surgeon: Daryll Brod, MD;  Location: [facility];  Service: Orthopedics;  Laterality: Left;  . IR FL GUIDED LOC OF NEEDLE/CATH TIP FOR SPINAL INJECTION RT  03/13/2020     OB History    Gravida  1   Para  1   Term  1   Preterm  0   AB  0   Living  1     SAB  0   TAB  0   Ectopic  0   Multiple  0   Live Births  1           Family History  Problem Relation Age of Onset  . Diabetes Maternal Aunt   . Hypertension Maternal Uncle   . Depression Maternal Grandfather   .  Diabetes Maternal Grandfather   . Hypertension Maternal Grandfather   . Arthritis Mother   . High blood pressure Mother   . Depression Mother   . Sleep apnea Mother   . Obesity Mother   . Diabetes Mother   . Hypertension Mother   . Sleep apnea Father   . Obesity Father     Social History   Tobacco Use  . Smoking status: Never Smoker  .  Smokeless tobacco: Never Used  Vaping Use  . Vaping Use: Never used  Substance Use Topics  . Alcohol use: Not Currently    Comment: social   . Drug use: No    Home Medications Prior to Admission medications   Medication Sig Start Date End Date Taking? Authorizing Provider  amLODipine (NORVASC) 10 MG tablet Take 1 tablet (10 mg total) by mouth daily. 10/27/19   Willodean Rosenthal, MD  baclofen (LIORESAL) 10 MG tablet Take 1 tablet (10 mg total) by mouth 2 (two) times daily. 02/28/20   Nafziger, Kandee Keen, NP  benzonatate (TESSALON) 200 MG capsule Take 1 capsule (200 mg total) by mouth 3 (three) times daily as needed for up to 7 days for cough. 06/09/20 06/16/20  Wieters, Hallie C, PA-C  Blood Pressure Monitoring KIT 1 kit by Does not apply route once a week. 05/05/19   Conan Bowens, MD  dextromethorphan-guaiFENesin [facility] DM) 30-600 MG 12hr tablet Take 1 tablet by mouth 2 (two) times daily. 06/09/20   Wieters, Hallie C, PA-C  ferrous sulfate 325 (65 FE) MG tablet Take 1 tablet (325 mg total) by mouth every other day. 02/23/20 05/23/20  Nafziger, Kandee Keen, NP  hydrochlorothiazide (HYDRODIURIL) 25 MG tablet Take 1 tablet (25 mg total) by mouth daily. 10/27/19   Willodean Rosenthal, MD  ibuprofen (ADVIL) 800 MG tablet Take 1 tablet (800 mg total) by mouth 3 (three) times daily. 06/09/20   Wieters, Hallie C, PA-C  loratadine (CLARITIN) 10 MG tablet Take 10 mg by mouth daily as needed for allergies.     [provider]  ondansetron (ZOFRAN ODT) 4 MG disintegrating tablet Take 1 tablet (4 mg total) by mouth every 8 (eight) hours as needed for nausea or  vomiting. 06/09/20   Wieters, Hallie C, PA-C  pantoprazole (PROTONIX) 40 MG tablet Take 1 tablet (40 mg total) by mouth daily. 05/04/20   Nafziger, Kandee Keen, NP  polyethylene glycol powder (GLYCOLAX/MIRALAX) 17 GM/SCOOP powder  10/11/19   [provider]  Semaglutide-Weight Management (WEGOVY) 0.5 MG/0.5ML SOAJ Inject 0.5 mg into the skin once a week. 06/06/20   Filbert Schilder, MD  sucralfate (CARAFATE) 1 GM/10ML suspension Take 10 mLs (1 g total) by mouth 4 (four) times daily -  with meals and at bedtime. 05/04/20   Nafziger, Kandee Keen, NP  topiramate (TOPAMAX) 25 MG tablet Take 2 tablets (50 mg total) by mouth 2 (two) times daily. 02/23/20   Dohmeier, Porfirio Mylar, MD  Vitamin D, Ergocalciferol, (DRISDOL) 1.25 MG (50000 UNIT) CAPS capsule Take 1 capsule (50,000 Units total) by mouth every 7 (seven) days. 04/24/20   Filbert Schilder, MD    Allergies    Bee pollen, Bee venom, Hydrocodone-acetaminophen, Peach flavor, and Pollen extract  Review of Systems   Review of Systems  Constitutional: Positive for fever.  HENT: Negative for ear pain and sore throat.   Eyes: Negative for pain and visual disturbance.  Respiratory: Positive for cough and shortness of breath.   Cardiovascular: Positive for chest pain. Negative for leg swelling.  Gastrointestinal: Positive for diarrhea, nausea and vomiting. Negative for abdominal pain and constipation.  Genitourinary: Negative for dysuria and hematuria.  Musculoskeletal: Negative for back pain.  Skin: Negative for rash.  Neurological: Negative for headaches.  All other systems reviewed and are negative.   Physical Exam Updated Vital Signs Pulse (!) 124   Ht 6' (1.829 m)   Wt (!) 200 kg   LMP  (LMP Unknown)   SpO2 95%   BMI  59.81 kg/m   Physical Exam Vitals and nursing note reviewed.  Constitutional:      General: She is not in acute distress.    Appearance: She is well-developed.  HENT:     Head: Normocephalic and atraumatic.  Eyes:      Conjunctiva/sclera: Conjunctivae normal.  Cardiovascular:     Rate and Rhythm: Regular rhythm. Tachycardia present.     Heart sounds: No murmur heard.   Pulmonary:     Effort: Tachypnea present. No respiratory distress.     Breath sounds: Examination of the right-lower field reveals rales. Examination of the left-lower field reveals rales. Decreased breath sounds and rales present.  Abdominal:     Palpations: Abdomen is soft.     Tenderness: There is no abdominal tenderness.  Musculoskeletal:     Cervical back: Neck supple.     Right lower leg: No tenderness. No edema.     Left lower leg: No tenderness. No edema.  Skin:    General: Skin is warm and dry.  Neurological:     Mental Status: She is alert.     ED Results / Procedures / Treatments   Labs (all labs ordered are listed, but only abnormal results are displayed) Labs Reviewed  CBC WITH DIFFERENTIAL/PLATELET - Abnormal; Notable for the following components:      Result Value   RBC 5.54 (*)    MCV 72.4 (*)    MCH 21.8 (*)    RDW 15.8 (*)    All other components within normal limits  COMPREHENSIVE METABOLIC PANEL - Abnormal; Notable for the following components:   Glucose, Bld 124 (*)    BUN <5 (*)    Calcium 8.6 (*)    AST 53 (*)    All other components within normal limits  D-DIMER, QUANTITATIVE (NOT AT [facility]) - Abnormal; Notable for the following components:   D-Dimer, Quant 0.61 (*)    All other components within normal limits  LACTATE DEHYDROGENASE - Abnormal; Notable for the following components:   LDH 297 (*)    All other components within normal limits  FIBRINOGEN - Abnormal; Notable for the following components:   Fibrinogen 764 (*)    All other components within normal limits  C-REACTIVE PROTEIN - Abnormal; Notable for the following components:   CRP 9.0 (*)    All other components within normal limits  CULTURE, BLOOD (ROUTINE X 2)  CULTURE, BLOOD (ROUTINE X 2)  LACTIC ACID, PLASMA  FERRITIN    TRIGLYCERIDES  PROCALCITONIN  BRAIN NATRIURETIC PEPTIDE  HEPATITIS B SURFACE ANTIGEN  I-STAT BETA HCG BLOOD, ED ([facility], [facility], [facility] ONLY)  TROPONIN I (HIGH SENSITIVITY)    EKG None  Radiology DG Chest 2 View  Result Date: 06/09/2020 CLINICAL DATA:  Recent bronchitis.  Worsening for 3 days with fever. EXAM: CHEST - 2 VIEW COMPARISON:  May 30, 2020 FINDINGS: No pneumothorax. Suggested subtle opacities in the bases. Cardiomegaly. The hila and mediastinum are normal. No pneumothorax. No other acute abnormalities. IMPRESSION: Suggested subtle bibasilar opacities, right greater than left, are concerning for pneumonia given the history of COVID-19 exposure and cough. Electronically Signed   By: Gerome Sam III M.D   On: 06/09/2020 17:08   DG Chest Port 1 View  Result Date: 06/11/2020 CLINICAL DATA:  Shortness of breath. EXAM: PORTABLE CHEST 1 VIEW COMPARISON:  June 09, 2020. FINDINGS: Stable cardiomediastinal silhouette. No pneumothorax pleural effusion is noted. Stable bilateral lung opacities are noted, right greater than left, concerning for multifocal pneumonia.  Bony thorax is unremarkable. IMPRESSION: Stable bilateral lung opacities, right greater than left, concerning for multifocal pneumonia. Electronically Signed   By: Marijo Conception M.D.   On: 06/11/2020 12:36    Procedures Procedures (including critical care time) CRITICAL CARE Performed by: Rodney Booze   Total critical care time: 33 minutes  Critical care time was exclusive of separately billable procedures and treating other patients.  Critical care was necessary to treat or prevent imminent or life-threatening deterioration.  Critical care was time spent personally by me on the following activities: development of treatment plan with patient and/or surrogate as well as nursing, discussions with consultants, evaluation of patient's response to treatment, examination of patient, obtaining history from patient or  surrogate, ordering and performing treatments and interventions, ordering and review of laboratory studies, ordering and review of radiographic studies, pulse oximetry and re-evaluation of patient's condition.   Medications Ordered in ED Medications  enoxaparin (LOVENOX) injection 100 mg (has no administration in time range)  remdesivir 200 mg in sodium chloride 0.9% 250 mL IVPB (has no administration in time range)    Followed by  remdesivir 100 mg in sodium chloride 0.9 % 100 mL IVPB (has no administration in time range)  albuterol (VENTOLIN HFA) 108 (90 Base) MCG/ACT inhaler 2 puff (has no administration in time range)  methylPREDNISolone sodium succinate (SOLU-MEDROL) 125 mg/2 mL injection 100 mg (has no administration in time range)  ascorbic acid (VITAMIN C) tablet 500 mg (has no administration in time range)  zinc sulfate capsule 220 mg (has no administration in time range)  acetaminophen (TYLENOL) tablet 650 mg (has no administration in time range)  ondansetron (ZOFRAN) tablet 4 mg (has no administration in time range)    Or  ondansetron (ZOFRAN) injection 4 mg (has no administration in time range)  ibuprofen (ADVIL) tablet 600 mg (600 mg Oral Given 06/11/20 1225)  dexamethasone (DECADRON) injection 10 mg (10 mg Intravenous Given 06/11/20 1226)  albuterol (VENTOLIN HFA) 108 (90 Base) MCG/ACT inhaler 2 puff (2 puffs Inhalation Given 06/11/20 1224)    ED Course  I have reviewed the triage vital signs and the nursing notes.  Pertinent labs & imaging results that were available during my care of the patient were reviewed by me and considered in my medical decision making (see chart for details).    MDM Rules/Calculators/A&P                          24 year old female presenting for evaluation of shortness of breath.  Became symptomatic of Covid about a week ago and was diagnosed with it at urgent care earlier this week.  Patient hypoxic on arrival requiring O2.  Also  tachycardic.  Reviewed/interpreted work-up CBC without leukocytosis or anemia CMP with normal electrolytes and kidney function.  AST slightly elevated. Lactic acid normal Inflammatory markers are elevated  EKG with sinus tachycardia otherwise no acute ischemic changes  Chest x-ray reviewed/interpreted which shows stable bilateral lung opacities right greater than the left concerning for multifocal pneumonia consistent with Covid infection.  Patient was given Decadron in the ED.  She was placed on supplemental oxygen due to her hypoxia.  Will call hospitalist for admission given respiratory failure secondary to COVID-19.  1:50 PM CONSULT with Dr. Ezekiel Ina with hospitalist service who accepts patient for admission.   Maureen Lewis was evaluated in Emergency Department on 06/11/2020 for the symptoms described in the history of present illness. She was evaluated in the  context of the global COVID-19 pandemic, which necessitated consideration that the patient might be at risk for infection with the SARS-CoV-2 virus that causes COVID-19. Institutional protocols and algorithms that pertain to the evaluation of patients at risk for COVID-19 are in a state of rapid change based on information released by regulatory bodies including the CDC and federal and state organizations. These policies and algorithms were followed during the patient's care in the ED.  Final Clinical Impression(s) / ED Diagnoses Final diagnoses:  [id number]    Rx / DC Orders ED Discharge Orders    None       Bishop Dublin 06/11/20 1352    Sherwood Gambler, MD 06/11/20 1521

## 2020-06-12 ENCOUNTER — Other Ambulatory Visit: Payer: Self-pay | Admitting: Adult Health

## 2020-06-12 DIAGNOSIS — G43809 Other migraine, not intractable, without status migrainosus: Secondary | ICD-10-CM | POA: Diagnosis not present

## 2020-06-12 DIAGNOSIS — J9601 Acute respiratory failure with hypoxia: Secondary | ICD-10-CM | POA: Diagnosis not present

## 2020-06-12 DIAGNOSIS — K219 Gastro-esophageal reflux disease without esophagitis: Secondary | ICD-10-CM | POA: Diagnosis not present

## 2020-06-12 DIAGNOSIS — U071 COVID-19: Secondary | ICD-10-CM | POA: Diagnosis not present

## 2020-06-12 DIAGNOSIS — I10 Essential (primary) hypertension: Secondary | ICD-10-CM | POA: Diagnosis not present

## 2020-06-12 LAB — PHOSPHORUS: Phosphorus: 2.9 mg/dL (ref 2.5–4.6)

## 2020-06-12 LAB — CBC WITH DIFFERENTIAL/PLATELET
Abs Immature Granulocytes: 0 10*3/uL (ref 0.00–0.07)
Basophils Absolute: 0 10*3/uL (ref 0.0–0.1)
Basophils Relative: 0 %
Eosinophils Absolute: 0 10*3/uL (ref 0.0–0.5)
Eosinophils Relative: 0 %
HCT: 40.6 % (ref 36.0–46.0)
Hemoglobin: 12.1 g/dL (ref 12.0–15.0)
Immature Granulocytes: 0 %
Lymphocytes Relative: 17 %
Lymphs Abs: 0.5 10*3/uL — ABNORMAL LOW (ref 0.7–4.0)
MCH: 21.7 pg — ABNORMAL LOW (ref 26.0–34.0)
MCHC: 29.8 g/dL — ABNORMAL LOW (ref 30.0–36.0)
MCV: 72.8 fL — ABNORMAL LOW (ref 80.0–100.0)
Monocytes Absolute: 0.1 10*3/uL (ref 0.1–1.0)
Monocytes Relative: 3 %
Neutro Abs: 2.1 10*3/uL (ref 1.7–7.7)
Neutrophils Relative %: 80 %
Platelets: 220 10*3/uL (ref 150–400)
RBC: 5.58 MIL/uL — ABNORMAL HIGH (ref 3.87–5.11)
RDW: 15.8 % — ABNORMAL HIGH (ref 11.5–15.5)
WBC: 2.6 10*3/uL — ABNORMAL LOW (ref 4.0–10.5)
nRBC: 0 % (ref 0.0–0.2)

## 2020-06-12 LAB — COMPREHENSIVE METABOLIC PANEL
ALT: 35 U/L (ref 0–44)
AST: 40 U/L (ref 15–41)
Albumin: 3.2 g/dL — ABNORMAL LOW (ref 3.5–5.0)
Alkaline Phosphatase: 49 U/L (ref 38–126)
Anion gap: 12 (ref 5–15)
BUN: 5 mg/dL — ABNORMAL LOW (ref 6–20)
CO2: 22 mmol/L (ref 22–32)
Calcium: 8.9 mg/dL (ref 8.9–10.3)
Chloride: 102 mmol/L (ref 98–111)
Creatinine, Ser: 0.64 mg/dL (ref 0.44–1.00)
GFR calc Af Amer: >60 mL/min (ref >60)
GFR calc non Af Amer: >60 mL/min (ref >60)
Glucose, Bld: 191 mg/dL — ABNORMAL HIGH (ref 70–99)
Potassium: 3.6 mmol/L (ref 3.5–5.1)
Sodium: 136 mmol/L (ref 135–145)
Total Bilirubin: 0.5 mg/dL (ref 0.3–1.2)
Total Protein: 7.6 g/dL (ref 6.5–8.1)

## 2020-06-12 LAB — FERRITIN: Ferritin: 116 ng/mL (ref 11–307)

## 2020-06-12 LAB — MAGNESIUM: Magnesium: 2 mg/dL (ref 1.7–2.4)

## 2020-06-12 LAB — D-DIMER, QUANTITATIVE: D-Dimer, Quant: 0.53 ug/mL-FEU — ABNORMAL HIGH (ref 0.00–0.50)

## 2020-06-12 LAB — C-REACTIVE PROTEIN: CRP: 10.3 mg/dL — ABNORMAL HIGH (ref <1.0)

## 2020-06-12 MED ORDER — ALBUTEROL SULFATE HFA 108 (90 BASE) MCG/ACT IN AERS: 2.0000 | INHALATION_SPRAY | Freq: Four times a day (QID) | RESPIRATORY_TRACT | 0 refills | Status: DC | PRN

## 2020-06-12 NOTE — ED Notes (Signed)
Attempted report x1. 

## 2020-06-12 NOTE — ED Notes (Signed)
RN assisted pt onto hospital bed, purewick changed and pt resting comfortably at this time.

## 2020-06-12 NOTE — Progress Notes (Signed)
PROGRESS NOTE                                                                                                                                                                                                             Patient Demographics:    Maureen Lewis, is a 24 y.o. female, DOB - Jun 12, 1996, NLZ:[id number]  Outpatient Primary MD for the patient is Dorothyann Peng, NP   Admit date - 06/11/2020   LOS - 1  Chief Complaint  Patient presents with   Shortness of Breath       Brief Narrative: Patient is a 24 y.o. female with PMHx of obesity, HTN, migraine headaches-who presented with shortness of breath-found to have pneumonia and hypoxic respiratory failure due to COVID-19.  She tested positive for COVID-19 on 8/21.  COVID-19 vaccinated status: Unvaccinated  Significant Events: 8/21>> COVID-19 positive 8/23>> Admit to [facility] for hypoxia due to COVID-19 pneumonia  Significant studies: 8/23>> CTA chest: No PE-multifocal pneumonia. 8/23>>Chest x-ray: Bilateral lung opacities right greater than left.  COVID-19 medications: Steroids: 8/23>> Remdesivir: 8/23>>  Antibiotics: None  Microbiology data: 8/23 >>blood culture: No growth  Procedures: None  Consults: None  DVT prophylaxis: SCDs Start: 06/11/20 1325 Lovenox at prophylactic dose     Subjective:    [name] today feels better-stable on just 2 L of oxygen.   Assessment  & Plan :   Acute Hypoxic Resp Failure due to Covid 19 Viral pneumonia: Has mild hypoxemia-continue steroids/remdesivir.  Follow inflammatory markers.  Attempt to titrate down FiO2.  Continue to monitor closely.  Fever: afebrile O2 requirements:  SpO2: 97 % O2 Flow Rate (L/min): 1.5 L/min   COVID-19 Labs: Recent Labs    06/11/20 1220 06/12/20 0456  DDIMER 0.61* 0.53*  FERRITIN 103 116  LDH 297*  --   CRP 9.0* 10.3*       Component Value Date/Time   BNP 10.9  06/11/2020 1327    Recent Labs  Lab 06/11/20 1220  PROCALCITON 1.01    Lab Results  Component Value Date   SARSCOV2NAA POSITIVE (A) 06/09/2020   [place] NEGATIVE 05/24/2020   [place] Not Detected 03/26/2020   [place] NEGATIVE 10/05/2019     Prone/Incentive Spirometry: encouraged  incentive spirometry use 3-4/hour.  HTN: Stable-continue amlodipine and HCTZ.  GERD: Continue PPI  Migraine headaches: Stable-continue Topamax  Microcytic anemia:  Chronic issue-stable for outpatient monitoring.  Morbid Obesity: Estimated body mass index is 59.81 kg/m as calculated from the following:   Height as of this encounter: 6' (1.829 m).   Weight as of this encounter: 200 kg.    ABG:    Component Value Date/Time   TCO2 26 08/20/2017 1459    Vent Settings: N/A  Condition - Stable  Family Communication  : Called grandmother-Chalestin Herbin [phone] per patient's request on 8/24.  Per patient-her mother is intubated and in the ICU.  Code Status :  Full Code  Diet :  Diet Order            Diet 2 gram sodium Room service appropriate? Yes; Fluid consistency: Thin  Diet effective now                  Disposition Plan  :   Status is: Inpatient  Remains inpatient appropriate because:Inpatient level of care appropriate due to severity of illness   Dispo: The patient is from: Home              Anticipated d/c is to: Home              Anticipated d/c date is: 2 days              Patient currently is not medically stable to d/c.  Barriers to discharge: Hypoxia requiring O2 supplementation/complete 5 days of IV Remdesivir  Antimicorbials  :    Anti-infectives (From admission, onward)   Start     Dose/Rate Route Frequency Ordered Stop   06/12/20 1000  remdesivir 100 mg in sodium chloride 0.9 % 100 mL IVPB       "Followed by" Linked Group Details   100 mg 200 mL/hr over 30 Minutes Intravenous Daily 06/11/20 1327 06/16/20 0959   06/11/20 1400  remdesivir  200 mg in sodium chloride 0.9% 250 mL IVPB       "Followed by" Linked Group Details   200 mg 580 mL/hr over 30 Minutes Intravenous Once 06/11/20 1327 06/11/20 1451      Inpatient Medications  Scheduled Meds:  albuterol  2 puff Inhalation Q6H   amLODipine  10 mg Oral Daily   vitamin C  500 mg Oral Daily   enoxaparin (LOVENOX) injection  100 mg Subcutaneous Q24H   ferrous sulfate  325 mg Oral QODAY   hydrochlorothiazide  25 mg Oral Daily   methylPREDNISolone (SOLU-MEDROL) injection  0.5 mg/kg Intravenous Q12H   pantoprazole  40 mg Oral Daily   topiramate  50 mg Oral BID   zinc sulfate  220 mg Oral Daily   Continuous Infusions:  remdesivir 100 mg in NS 100 mL 100 mg (06/12/20 0945)   PRN Meds:.acetaminophen, baclofen, diphenhydrAMINE, ondansetron **OR** ondansetron (ZOFRAN) IV   Time Spent in minutes  25  See all Orders from today for further details   Oren Binet M.D on 06/12/2020 at 10:57 AM  To page go to [email or web address] - use universal password  [facility] -  Office  [phone]    Objective:   Vitals:   06/12/20 0600 06/12/20 0700 06/12/20 0800 06/12/20 0900  BP: 115/63 128/67 127/71 127/79  Pulse: 99 97 (!) 109 (!) 115  Resp: (!) 28 (!) 25 (!) 22 (!) 29  Temp:      TempSrc:      SpO2: 95% 92% 94% 97%  Weight:      Height:        Wt Readings from Last  3 Encounters:  06/11/20 (!) 200 kg  06/06/20 (!) 200 kg  05/25/20 (!) 204.1 kg     Intake/Output Summary (Last 24 hours) at 06/12/2020 1057 Last data filed at 06/12/2020 0418 Gross per 24 hour  Intake 290 ml  Output 1000 ml  Net -710 ml     Physical Exam Gen Exam:Alert awake-not in any distress HEENT:atraumatic, normocephalic Chest: B/L clear to auscultation anteriorly CVS:S1S2 regular Abdomen:soft non tender, non distended Extremities:no edema Neurology: Non focal Skin: no rash   Data Review:    CBC Recent Labs  Lab 06/11/20 1220 06/12/20 0456  WBC 4.8 2.6*  HGB  12.1 12.1  HCT 40.1 40.6  PLT 206 220  MCV 72.4* 72.8*  MCH 21.8* 21.7*  MCHC 30.2 29.8*  RDW 15.8* 15.8*  LYMPHSABS 0.8 0.5*  MONOABS 0.1 0.1  EOSABS 0.0 0.0  BASOSABS 0.0 0.0    Chemistries  Recent Labs  Lab 06/11/20 1220 06/12/20 0456  NA 137 136  K 3.7 3.6  CL 102 102  CO2 23 22  GLUCOSE 124* 191*  BUN <5* 5*  CREATININE 0.77 0.64  CALCIUM 8.6* 8.9  MG  --  2.0  AST 53* 40  ALT 37 35  ALKPHOS 51 49  BILITOT 0.5 0.5   ------------------------------------------------------------------------------------------------------------------ Recent Labs    06/11/20 1220  TRIG 53    Lab Results  Component Value Date   HGBA1C 6.0 (H) 04/09/2020   ------------------------------------------------------------------------------------------------------------------ No results for input(s): TSH, T4TOTAL, T3FREE, THYROIDAB in the last 72 hours.  Invalid input(s): FREET3 ------------------------------------------------------------------------------------------------------------------ Recent Labs    06/11/20 1220 06/12/20 0456  FERRITIN 103 116    Coagulation profile No results for input(s): INR, PROTIME in the last 168 hours.  Recent Labs    06/11/20 1220 06/12/20 0456  DDIMER 0.61* 0.53*    Cardiac Enzymes No results for input(s): CKMB, TROPONINI, MYOGLOBIN in the last 168 hours.  Invalid input(s): CK ------------------------------------------------------------------------------------------------------------------    Component Value Date/Time   BNP 10.9 06/11/2020 1327    Micro Results Recent Results (from the past 240 hour(s))  SARS CORONAVIRUS 2 (TAT 6-24 HRS) Nasopharyngeal Nasopharyngeal Swab     Status: Abnormal   Collection Time: 06/09/20  6:53 PM   Specimen: Nasopharyngeal Swab  Result Value Ref Range Status   SARS Coronavirus 2 POSITIVE (A) NEGATIVE Final    Comment: [employer] @ [employer] on 06/10/2020 by Braxton Feathers (NOTE) SARS-CoV-2  target nucleic acids are DETECTED.  The SARS-CoV-2 RNA is generally detectable in upper and lower respiratory specimens during the acute phase of infection. Positive results are indicative of the presence of SARS-CoV-2 RNA. Clinical correlation with patient history and other diagnostic information is  necessary to determine patient infection status. Positive results do not rule out bacterial infection or co-infection with other viruses.  The expected result is Negative.  Fact Sheet for Patients: SugarRoll.be  Fact Sheet for Healthcare Providers: https://www.woods-mathews.com/  This test is not yet approved or cleared by the [place] FDA and  has been authorized for detection and/or diagnosis of SARS-CoV-2 by FDA under an Emergency Use Authorization (EUA). This EUA will remain  in effect (meaning this test can be used) for the duration of the COVI D-19 declaration under Section 564(b)(1) of the Act, 21 U.S.C. section 360bbb-3(b)(1), unless the authorization is terminated or revoked sooner.   Performed at [facility], [place] [address], [place], [place] [address]   Blood Culture (routine x 2)     Status: None (  Preliminary result)   Collection Time: 06/11/20 12:20 PM   Specimen: BLOOD LEFT HAND  Result Value Ref Range Status   Specimen Description BLOOD LEFT HAND  Final   Special Requests   Final    BOTTLES DRAWN AEROBIC AND ANAEROBIC Blood Culture results may not be optimal due to an inadequate volume of blood received in culture bottles   Culture   Final    NO GROWTH < 24 HOURS Performed at [facility], [place] [address] [place], [place] [address]    Report Status PENDING  Incomplete  Blood Culture (routine x 2)     Status: None (Preliminary result)   Collection Time: 06/11/20 12:35 PM   Specimen: BLOOD  Result Value Ref Range Status   Specimen Description BLOOD RIGHT ANTECUBITAL  Final   Special Requests   Final     BOTTLES DRAWN AEROBIC ONLY Blood Culture results may not be optimal due to an inadequate volume of blood received in culture bottles   Culture   Final    NO GROWTH < 24 HOURS Performed at [facility], [place] [address], [place], [place] [address]    Report Status PENDING  Incomplete    Radiology Reports DG Chest 2 View  Result Date: 06/09/2020 CLINICAL DATA:  Recent bronchitis.  Worsening for 3 days with fever. EXAM: CHEST - 2 VIEW COMPARISON:  May 30, 2020 FINDINGS: No pneumothorax. Suggested subtle opacities in the bases. Cardiomegaly. The hila and mediastinum are normal. No pneumothorax. No other acute abnormalities. IMPRESSION: Suggested subtle bibasilar opacities, right greater than left, are concerning for pneumonia given the history of COVID-19 exposure and cough. Electronically Signed   By: Dorise Bullion III M.D   On: 06/09/2020 17:08   DG Chest 2 View  Result Date: 05/31/2020 CLINICAL DATA:  Left anterior chest pain 3 days.  Cough. EXAM: CHEST - 2 VIEW COMPARISON:  02/16/2020 FINDINGS: Lungs are adequately inflated without focal airspace consolidation or effusion. Cardiomediastinal silhouette and bony structures are within normal. Moderate overlying soft tissues. IMPRESSION: No active cardiopulmonary disease. Electronically Signed   By: Marin Olp M.D.   On: 05/31/2020 14:43   CT ANGIO CHEST PE W OR WO CONTRAST  Result Date: 06/11/2020 CLINICAL DATA:  COVID-19 positivity with shortness of breath and chest pain, initial encounter EXAM: CT ANGIOGRAPHY CHEST WITH CONTRAST TECHNIQUE: Multidetector CT imaging of the chest was performed using the standard protocol during bolus administration of intravenous contrast. Multiplanar CT image reconstructions and MIPs were obtained to evaluate the vascular anatomy. CONTRAST:  66mL OMNIPAQUE IOHEXOL 350 MG/ML SOLN COMPARISON:  Chest x-ray from earlier in the same day. FINDINGS: Cardiovascular: Heart is mildly enlarged in size. The thoracic  aorta and its branches are within normal limits. The pulmonary artery is incompletely evaluated due to poor timing of the contrast bolus likely related to patient's body habitus. No large central pulmonary embolus is noted. No coronary calcifications are noted. No pericardial effusion is noted. Mediastinum/Nodes: Thoracic inlet is within normal limits. No sizable hilar or mediastinal adenopathy is noted. The esophagus as visualized is within normal limits. Lungs/Pleura: Lungs are well aerated bilaterally but demonstrate bilateral ground-glass opacities consistent with the given clinical history of COVID-19 positivity. No sizable effusion is seen. No pneumothorax is noted. Upper Abdomen: Visualized upper abdomen is unremarkable. Musculoskeletal: No acute bony abnormality is noted. Review of the MIP images confirms the above findings. IMPRESSION: Changes consistent with multifocal COVID-19 pneumonia. No definitive pulmonary emboli are seen although the  opacification of the pulmonary artery is somewhat limited due to timing of the contrast bolus in the patient's body habitus. Cardiac enlargement. Electronically Signed   By: Inez Catalina M.D.   On: 06/11/2020 16:03   DG Chest Port 1 View  Result Date: 06/11/2020 CLINICAL DATA:  Shortness of breath. EXAM: PORTABLE CHEST 1 VIEW COMPARISON:  June 09, 2020. FINDINGS: Stable cardiomediastinal silhouette. No pneumothorax pleural effusion is noted. Stable bilateral lung opacities are noted, right greater than left, concerning for multifocal pneumonia. Bony thorax is unremarkable. IMPRESSION: Stable bilateral lung opacities, right greater than left, concerning for multifocal pneumonia. Electronically Signed   By: Marijo Conception M.D.   On: 06/11/2020 12:36

## 2020-06-12 NOTE — ED Notes (Signed)
Attempted report x 2 

## 2020-06-12 NOTE — ED Notes (Signed)
RN requested for hospital bed to be ordered.

## 2020-06-13 ENCOUNTER — Ambulatory Visit: Payer: BC Managed Care – PPO | Admitting: Physician Assistant

## 2020-06-13 DIAGNOSIS — U071 COVID-19: Secondary | ICD-10-CM | POA: Diagnosis not present

## 2020-06-13 DIAGNOSIS — I10 Essential (primary) hypertension: Secondary | ICD-10-CM | POA: Diagnosis not present

## 2020-06-13 DIAGNOSIS — K219 Gastro-esophageal reflux disease without esophagitis: Secondary | ICD-10-CM | POA: Diagnosis not present

## 2020-06-13 DIAGNOSIS — J9601 Acute respiratory failure with hypoxia: Secondary | ICD-10-CM | POA: Diagnosis not present

## 2020-06-13 DIAGNOSIS — G43809 Other migraine, not intractable, without status migrainosus: Secondary | ICD-10-CM | POA: Diagnosis not present

## 2020-06-13 LAB — CBC WITH DIFFERENTIAL/PLATELET
Abs Immature Granulocytes: 0.02 10*3/uL (ref 0.00–0.07)
Basophils Absolute: 0 10*3/uL (ref 0.0–0.1)
Basophils Relative: 0 %
Eosinophils Absolute: 0 10*3/uL (ref 0.0–0.5)
Eosinophils Relative: 0 %
HCT: 43.2 % (ref 36.0–46.0)
Hemoglobin: 12.9 g/dL (ref 12.0–15.0)
Immature Granulocytes: 0 %
Lymphocytes Relative: 9 %
Lymphs Abs: 0.8 10*3/uL (ref 0.7–4.0)
MCH: 21 pg — ABNORMAL LOW (ref 26.0–34.0)
MCHC: 29.9 g/dL — ABNORMAL LOW (ref 30.0–36.0)
MCV: 70.5 fL — ABNORMAL LOW (ref 80.0–100.0)
Monocytes Absolute: 0.3 10*3/uL (ref 0.1–1.0)
Monocytes Relative: 3 %
Neutro Abs: 7.8 10*3/uL — ABNORMAL HIGH (ref 1.7–7.7)
Neutrophils Relative %: 88 %
Platelets: 302 10*3/uL (ref 150–400)
RBC: 6.13 MIL/uL — ABNORMAL HIGH (ref 3.87–5.11)
RDW: 15.9 % — ABNORMAL HIGH (ref 11.5–15.5)
WBC: 8.9 10*3/uL (ref 4.0–10.5)
nRBC: 0 % (ref 0.0–0.2)

## 2020-06-13 LAB — COMPREHENSIVE METABOLIC PANEL
ALT: 32 U/L (ref 0–44)
AST: 31 U/L (ref 15–41)
Albumin: 3.5 g/dL (ref 3.5–5.0)
Alkaline Phosphatase: 50 U/L (ref 38–126)
Anion gap: 11 (ref 5–15)
BUN: 9 mg/dL (ref 6–20)
CO2: 23 mmol/L (ref 22–32)
Calcium: 9.5 mg/dL (ref 8.9–10.3)
Chloride: 104 mmol/L (ref 98–111)
Creatinine, Ser: 0.72 mg/dL (ref 0.44–1.00)
GFR calc Af Amer: >60 mL/min (ref >60)
GFR calc non Af Amer: >60 mL/min (ref >60)
Glucose, Bld: 176 mg/dL — ABNORMAL HIGH (ref 70–99)
Potassium: 4 mmol/L (ref 3.5–5.1)
Sodium: 138 mmol/L (ref 135–145)
Total Bilirubin: 0.3 mg/dL (ref 0.3–1.2)
Total Protein: 8.4 g/dL — ABNORMAL HIGH (ref 6.5–8.1)

## 2020-06-13 LAB — D-DIMER, QUANTITATIVE: D-Dimer, Quant: 0.43 ug/mL-FEU (ref 0.00–0.50)

## 2020-06-13 LAB — FERRITIN: Ferritin: 151 ng/mL (ref 11–307)

## 2020-06-13 LAB — C-REACTIVE PROTEIN: CRP: 3.3 mg/dL — ABNORMAL HIGH (ref <1.0)

## 2020-06-13 MED ORDER — DIPHENHYDRAMINE HCL 50 MG/ML IJ SOLN
INTRAMUSCULAR | Status: AC
  Filled 2020-06-13: qty 1

## 2020-06-13 MED ORDER — ALUM & MAG HYDROXIDE-SIMETH 200-200-20 MG/5ML PO SUSP
30.0000 mL | Freq: Four times a day (QID) | ORAL | Status: DC | PRN
  Administered 2020-06-13: 30 mL via ORAL
  Filled 2020-06-13: qty 30

## 2020-06-13 MED ORDER — EPINEPHRINE 0.3 MG/0.3ML IJ SOAJ
0.3000 mg | Freq: Once | INTRAMUSCULAR | Status: DC
  Filled 2020-06-13: qty 0.6

## 2020-06-13 MED ORDER — DIPHENHYDRAMINE HCL 50 MG/ML IJ SOLN
25.0000 mg | Freq: Once | INTRAMUSCULAR | Status: AC
  Administered 2020-06-13: 25 mg via INTRAVENOUS

## 2020-06-13 MED ORDER — EPINEPHRINE 0.3 MG/0.3ML IJ SOAJ
0.3000 mg | INTRAMUSCULAR | Status: DC | PRN
  Filled 2020-06-13: qty 0.6

## 2020-06-13 MED ORDER — FAMOTIDINE IN NACL 20-0.9 MG/50ML-% IV SOLN
20.0000 mg | Freq: Once | INTRAVENOUS | Status: AC
  Administered 2020-06-13: 20 mg via INTRAVENOUS
  Filled 2020-06-13: qty 50

## 2020-06-13 MED ORDER — SENNA 8.6 MG PO TABS
1.0000 | ORAL_TABLET | Freq: Every day | ORAL | Status: DC | PRN
  Administered 2020-06-14: 8.6 mg via ORAL
  Filled 2020-06-13: qty 1

## 2020-06-13 MED ORDER — BISACODYL 5 MG PO TBEC
5.0000 mg | DELAYED_RELEASE_TABLET | Freq: Every day | ORAL | Status: DC | PRN
  Administered 2020-06-13: 5 mg via ORAL
  Filled 2020-06-13: qty 1

## 2020-06-13 NOTE — Significant Event (Signed)
Rapid Response Event Note   Reason for Call :  Right side facial and throat swelling with remdesivir infusion Initial Focused Assessment:  Patient initially coughing, quickly relaxed.  No stridor or distress noted.  No wheezing. BP 138/83  ST 110  RR 26  O2 sat 100% on 3L [place]  Interventions:  25mg  Benadryl IV 100mg  Solumedrol IV Patient states she is already feeling better.  RN to given Pepcid IV   Plan of Care:  RN to call if patient develops stridor or difficulty breathing.   Event Summary:   MD Notified: Dr Sloan Leiter at bedside Call Time:  [place] Time:  1238 End Time: [place]  Raliegh Ip, RN

## 2020-06-13 NOTE — Progress Notes (Signed)
PROGRESS NOTE                                                                                                                                                                                                             Patient Demographics:    Maureen Lewis, is a 24 y.o. female, DOB - 10-May-1996, [id number]  Outpatient Primary MD for the patient is Dorothyann Peng, NP   Admit date - 06/11/2020   LOS - 2  Chief Complaint  Patient presents with  . Shortness of Breath       Brief Narrative: Patient is a 24 y.o. female with PMHx of obesity, HTN, migraine headaches-who presented with shortness of breath-found to have pneumonia and hypoxic respiratory failure due to COVID-19.  She tested positive for COVID-19 on 8/21.  COVID-19 vaccinated status: Unvaccinated  Significant Events: 8/21>> COVID-19 positive 8/23>> Admit to [facility] for hypoxia due to COVID-19 pneumonia  Significant studies: 8/23>> CTA chest: No PE-multifocal pneumonia. 8/23>>Chest x-ray: Bilateral lung opacities right greater than left.  COVID-19 medications: Steroids: 8/23>> Remdesivir: 8/23>>  Antibiotics: None  Microbiology data: 8/23 >>blood culture: No growth  Procedures: None  Consults: None  DVT prophylaxis: SCDs Start: 06/11/20 1325 Lovenox at prophylactic dose     Subjective:   No major issues overnight down to 2 L of oxygen.  Later on this morning-informed by RN that patient had swelling of the right side of her face and throat when remdesivir infusion was started.  When I arrived-patient was a very comfortable-did have some evidence of mild right-sided facial swelling.   Assessment  & Plan :   Acute Hypoxic Resp Failure due to Covid 19 Viral pneumonia: Continue attempts to slowly titrate down FiO2-stopped remdesivir due to concern for mild angioedema that occurred on 8/20 5 AM.  Remains on steroids.  Follow inflammatory markers.    Fever: afebrile O2 requirements:  SpO2: 98 % O2 Flow Rate (L/min): 3 L/min   COVID-19 Labs: Recent Labs    06/11/20 1220 06/12/20 0456 06/13/20 0059  DDIMER 0.61* 0.53* 0.43  FERRITIN 103 116 151  LDH 297*  --   --   CRP 9.0* 10.3* 3.3*       Component Value Date/Time   BNP 10.9 06/11/2020 1327    Recent Labs  Lab 06/11/20 1220  PROCALCITON 1.01    Lab  Results  Component Value Date   SARSCOV2NAA POSITIVE (A) 06/09/2020   [place] NEGATIVE 05/24/2020   [place] Not Detected 03/26/2020   [place] NEGATIVE 10/05/2019     Prone/Incentive Spirometry: encouraged  incentive spirometry use 3-4/hour.  Angioedema: Occurred on 8/25-appears to be mild-Per nursing report-occurred while remdesivir infusion was started.  On my exam-some right facial swelling but do not see any major throat swelling.  Not short of breath-no obvious stridor.  Given steroids/Benadryl-I have added as needed epinephrine.  Stop Remdesivir-have added to allergy list.  HTN: Stable-continue amlodipine and HCTZ.  GERD: Continue PPI  Migraine headaches: Stable-continue Topamax  Microcytic anemia: Chronic issue-stable for outpatient monitoring.  Morbid Obesity: Estimated body mass index is 59.81 kg/m as calculated from the following:   Height as of this encounter: 6' (1.829 m).   Weight as of this encounter: 200 kg.    ABG:    Component Value Date/Time   TCO2 26 08/20/2017 1459    Vent Settings: N/A  Condition - Stable  Family Communication  : Called grandmother-Chalestin Herbin [phone] per patient's request on 8/24.  I called pt's grandmother on 8/25-someone else answer the phone-apparently she is in the emergency room.    Per patient-her mother is intubated and in the ICU.  Code Status :  Full Code  Diet :  Diet Order            Diet 2 gram sodium Room service appropriate? Yes; Fluid consistency: Thin  Diet effective now                  Disposition Plan  :    Status is: Inpatient  Remains inpatient appropriate because:Inpatient level of care appropriate due to severity of illness   Dispo: The patient is from: Home              Anticipated d/c is to: Home              Anticipated d/c date is: 2 days              Patient currently is not medically stable to d/c.  Barriers to discharge: Hypoxia requiring O2 supplementation  Antimicorbials  :    Anti-infectives (From admission, onward)   Start     Dose/Rate Route Frequency Ordered Stop   06/12/20 1000  remdesivir 100 mg in sodium chloride 0.9 % 100 mL IVPB  Status:  Discontinued       "Followed by" Linked Group Details   100 mg 200 mL/hr over 30 Minutes Intravenous Daily 06/11/20 1327 06/13/20 1415   06/11/20 1400  remdesivir 200 mg in sodium chloride 0.9% 250 mL IVPB       "Followed by" Linked Group Details   200 mg 580 mL/hr over 30 Minutes Intravenous Once 06/11/20 1327 06/11/20 1451      Inpatient Medications  Scheduled Meds: . albuterol  2 puff Inhalation Q6H  . amLODipine  10 mg Oral Daily  . vitamin C  500 mg Oral Daily  . diphenhydrAMINE      . enoxaparin (LOVENOX) injection  100 mg Subcutaneous Q24H  . ferrous sulfate  325 mg Oral QODAY  . hydrochlorothiazide  25 mg Oral Daily  . methylPREDNISolone (SOLU-MEDROL) injection  0.5 mg/kg Intravenous Q12H  . pantoprazole  40 mg Oral Daily  . topiramate  50 mg Oral BID  . zinc sulfate  220 mg Oral Daily   Continuous Infusions:  PRN Meds:.acetaminophen, baclofen, diphenhydrAMINE, EPINEPHrine, ondansetron **OR** ondansetron (ZOFRAN) IV  Time Spent in minutes  25  See all Orders from today for further details   Oren Binet M.D on 06/13/2020 at 2:37 PM  To page go to [email or web address] - use universal password  [facility] -  Office  [phone]    Objective:   Vitals:   06/12/20 2100 06/12/20 2340 06/13/20 0749 06/13/20 1258  BP: 136/80 116/73 131/69 138/83  Pulse: (!) 102 81 89 88  Resp: (!) 32 20 16  (!) 25  Temp:  98.5 F (36.9 C) 98.1 F (36.7 C)   TempSrc:  Oral    SpO2: 92% 96%  98%  Weight:      Height:        Wt Readings from Last 3 Encounters:  06/11/20 (!) 200 kg  06/06/20 (!) 200 kg  05/25/20 (!) 204.1 kg     Intake/Output Summary (Last 24 hours) at 06/13/2020 1437 Last data filed at 06/13/2020 0200 Gross per 24 hour  Intake 600 ml  Output --  Net 600 ml     Physical Exam Gen Exam:Alert awake-not in any distress HEENT:atraumatic, normocephalic Chest: B/L clear to auscultation anteriorly CVS:S1S2 regular Abdomen:soft non tender, non distended Extremities:no edema Neurology: Non focal Skin: no rash   Data Review:    CBC Recent Labs  Lab 06/11/20 1220 06/12/20 0456 06/13/20 0059  WBC 4.8 2.6* 8.9  HGB 12.1 12.1 12.9  HCT 40.1 40.6 43.2  PLT 206 220 302  MCV 72.4* 72.8* 70.5*  MCH 21.8* 21.7* 21.0*  MCHC 30.2 29.8* 29.9*  RDW 15.8* 15.8* 15.9*  LYMPHSABS 0.8 0.5* 0.8  MONOABS 0.1 0.1 0.3  EOSABS 0.0 0.0 0.0  BASOSABS 0.0 0.0 0.0    Chemistries  Recent Labs  Lab 06/11/20 1220 06/12/20 0456 06/13/20 0059  NA 137 136 138  K 3.7 3.6 4.0  CL 102 102 104  CO2 23 22 23   GLUCOSE 124* 191* 176*  BUN <5* 5* 9  CREATININE 0.77 0.64 0.72  CALCIUM 8.6* 8.9 9.5  MG  --  2.0  --   AST 53* 40 31  ALT 37 35 32  ALKPHOS 51 49 50  BILITOT 0.5 0.5 0.3   ------------------------------------------------------------------------------------------------------------------ Recent Labs    06/11/20 1220  TRIG 53    Lab Results  Component Value Date   HGBA1C 6.0 (H) 04/09/2020   ------------------------------------------------------------------------------------------------------------------ No results for input(s): TSH, T4TOTAL, T3FREE, THYROIDAB in the last 72 hours.  Invalid input(s): FREET3 ------------------------------------------------------------------------------------------------------------------ Recent Labs    06/12/20 0456  06/13/20 0059  FERRITIN 116 151    Coagulation profile No results for input(s): INR, PROTIME in the last 168 hours.  Recent Labs    06/12/20 0456 06/13/20 0059  DDIMER 0.53* 0.43    Cardiac Enzymes No results for input(s): CKMB, TROPONINI, MYOGLOBIN in the last 168 hours.  Invalid input(s): CK ------------------------------------------------------------------------------------------------------------------    Component Value Date/Time   BNP 10.9 06/11/2020 1327    Micro Results Recent Results (from the past 240 hour(s))  SARS CORONAVIRUS 2 (TAT 6-24 HRS) Nasopharyngeal Nasopharyngeal Swab     Status: Abnormal   Collection Time: 06/09/20  6:53 PM   Specimen: Nasopharyngeal Swab  Result Value Ref Range Status   SARS Coronavirus 2 POSITIVE (A) NEGATIVE Final    Comment: [employer] @ [place] on 06/10/2020 by Braxton Feathers (NOTE) SARS-CoV-2 target nucleic acids are DETECTED.  The SARS-CoV-2 RNA is generally detectable in upper and lower respiratory specimens during the acute phase of infection. Positive results are indicative of  the presence of SARS-CoV-2 RNA. Clinical correlation with patient history and other diagnostic information is  necessary to determine patient infection status. Positive results do not rule out bacterial infection or co-infection with other viruses.  The expected result is Negative.  Fact Sheet for Patients: SugarRoll.be  Fact Sheet for Healthcare Providers: https://www.woods-mathews.com/  This test is not yet approved or cleared by the [place] FDA and  has been authorized for detection and/or diagnosis of SARS-CoV-2 by FDA under an Emergency Use Authorization (EUA). This EUA will remain  in effect (meaning this test can be used) for the duration of the COVI D-19 declaration under Section 564(b)(1) of the Act, 21 U.S.C. section 360bbb-3(b)(1), unless the authorization is terminated or revoked  sooner.   Performed at [facility], [place] [address], [place], [place] [address]   Blood Culture (routine x 2)     Status: None (Preliminary result)   Collection Time: 06/11/20 12:20 PM   Specimen: BLOOD LEFT HAND  Result Value Ref Range Status   Specimen Description BLOOD LEFT HAND  Final   Special Requests   Final    BOTTLES DRAWN AEROBIC AND ANAEROBIC Blood Culture results may not be optimal due to an inadequate volume of blood received in culture bottles   Culture   Final    NO GROWTH 2 DAYS Performed at [facility], [place] [address], [place], [place] [address]    Report Status PENDING  Incomplete  Blood Culture (routine x 2)     Status: None (Preliminary result)   Collection Time: 06/11/20 12:35 PM   Specimen: BLOOD  Result Value Ref Range Status   Specimen Description BLOOD RIGHT ANTECUBITAL  Final   Special Requests   Final    BOTTLES DRAWN AEROBIC ONLY Blood Culture results may not be optimal due to an inadequate volume of blood received in culture bottles   Culture   Final    NO GROWTH 2 DAYS Performed at [facility], [place] [address], [place], [place] [address]    Report Status PENDING  Incomplete    Radiology Reports DG Chest 2 View  Result Date: 06/09/2020 CLINICAL DATA:  Recent bronchitis.  Worsening for 3 days with fever. EXAM: CHEST - 2 VIEW COMPARISON:  May 30, 2020 FINDINGS: No pneumothorax. Suggested subtle opacities in the bases. Cardiomegaly. The hila and mediastinum are normal. No pneumothorax. No other acute abnormalities. IMPRESSION: Suggested subtle bibasilar opacities, right greater than left, are concerning for pneumonia given the history of COVID-19 exposure and cough. Electronically Signed   By: Dorise Bullion III M.D   On: 06/09/2020 17:08   DG Chest 2 View  Result Date: 05/31/2020 CLINICAL DATA:  Left anterior chest pain 3 days.  Cough. EXAM: CHEST - 2 VIEW COMPARISON:  02/16/2020 FINDINGS: Lungs are adequately inflated without  focal airspace consolidation or effusion. Cardiomediastinal silhouette and bony structures are within normal. Moderate overlying soft tissues. IMPRESSION: No active cardiopulmonary disease. Electronically Signed   By: Marin Olp M.D.   On: 05/31/2020 14:43   CT ANGIO CHEST PE W OR WO CONTRAST  Result Date: 06/11/2020 CLINICAL DATA:  COVID-19 positivity with shortness of breath and chest pain, initial encounter EXAM: CT ANGIOGRAPHY CHEST WITH CONTRAST TECHNIQUE: Multidetector CT imaging of the chest was performed using the standard protocol during bolus administration of intravenous contrast. Multiplanar CT image reconstructions and MIPs were obtained to evaluate the vascular anatomy. CONTRAST:  55mL OMNIPAQUE IOHEXOL 350 MG/ML SOLN COMPARISON:  Chest  x-ray from earlier in the same day. FINDINGS: Cardiovascular: Heart is mildly enlarged in size. The thoracic aorta and its branches are within normal limits. The pulmonary artery is incompletely evaluated due to poor timing of the contrast bolus likely related to patient's body habitus. No large central pulmonary embolus is noted. No coronary calcifications are noted. No pericardial effusion is noted. Mediastinum/Nodes: Thoracic inlet is within normal limits. No sizable hilar or mediastinal adenopathy is noted. The esophagus as visualized is within normal limits. Lungs/Pleura: Lungs are well aerated bilaterally but demonstrate bilateral ground-glass opacities consistent with the given clinical history of COVID-19 positivity. No sizable effusion is seen. No pneumothorax is noted. Upper Abdomen: Visualized upper abdomen is unremarkable. Musculoskeletal: No acute bony abnormality is noted. Review of the MIP images confirms the above findings. IMPRESSION: Changes consistent with multifocal COVID-19 pneumonia. No definitive pulmonary emboli are seen although the opacification of the pulmonary artery is somewhat limited due to timing of the contrast bolus in the  patient's body habitus. Cardiac enlargement. Electronically Signed   By: Inez Catalina M.D.   On: 06/11/2020 16:03   DG Chest Port 1 View  Result Date: 06/11/2020 CLINICAL DATA:  Shortness of breath. EXAM: PORTABLE CHEST 1 VIEW COMPARISON:  June 09, 2020. FINDINGS: Stable cardiomediastinal silhouette. No pneumothorax pleural effusion is noted. Stable bilateral lung opacities are noted, right greater than left, concerning for multifocal pneumonia. Bony thorax is unremarkable. IMPRESSION: Stable bilateral lung opacities, right greater than left, concerning for multifocal pneumonia. Electronically Signed   By: Marijo Conception M.D.   On: 06/11/2020 12:36

## 2020-06-13 NOTE — Plan of Care (Signed)

## 2020-06-13 NOTE — Progress Notes (Signed)
Pt had a allergic reaction to remdesivir MD Ghimire. Shanker was notified new order was given. Rapid  respones was called.

## 2020-06-13 NOTE — Progress Notes (Signed)
Pt arrived to 2w room 30 from ED. Report received from [facility]. Pt transported via bed. Pt has IV x 2 and is on 2L [place] with oxgyen saturation 94%. Orders acknowledged and pt oriented to unit and room.

## 2020-06-13 NOTE — Progress Notes (Signed)
Had right-sided face swelling and throat swelling while getting remdesivir infusion.  Promptly treated with steroids and Benadryl-by the time I arrived-very mild swelling of the right facial area with some erythema.  Throat without any evidence of swelling.  Will continue to monitor closely-we will discuss with pharmacy-may need to add Remdesivir to the allergy list.

## 2020-06-13 NOTE — Evaluation (Signed)
Physical Therapy Evaluation Patient Details Name: Maureen Lewis MRN: [id number] DOB: 09-08-1996 Today's Date: 06/13/2020   History of Present Illness  Patient is a 24 y.o. female with PMHx of obesity, HTN, migraine headaches-who presented with shortness of breath-found to have pneumonia and hypoxic respiratory failure due to COVID-19.  She tested positive for COVID-19 on 8/21.  Clinical Impression  Patient presents with decreased mobility due to weakness, decreased activity tolerance and will benefit from skilled PT in the acute setting to maximize mobility, strength, safety and independence prior to d/c home with family support.      Follow Up Recommendations No PT follow up    Equipment Recommendations  None recommended by PT    Recommendations for Other Services       Precautions / Restrictions Precautions Precaution Comments: watch O2      Mobility  Bed Mobility Overal bed mobility: Modified Independent             General bed mobility comments: up and down from tall barimax bed  Transfers Overall transfer level: Needs assistance Equipment used: None Transfers: Sit to/from Stand Sit to Stand: Supervision         General transfer comment: assist for lines/safety  Ambulation/Gait Ambulation/Gait assistance: Min guard;Supervision Gait Distance (Feet): 150 Feet (5 laps in room) Assistive device: None Gait Pattern/deviations: Step-through pattern;Decreased stride length     General Gait Details: in room making short turns without difficulty, S for O2 and IV lines  Stairs            Wheelchair Mobility    Modified Rankin (Stroke Patients Only)       Balance Overall balance assessment: Modified Independent                                           Pertinent Vitals/Pain Pain Assessment: No/denies pain    Home Living Family/patient expects to be discharged to:: Private residence Living Arrangements: Parent Available Help at  Discharge: Family;Friend(s) Type of Home: House Home Access: Stairs to enter Entrance Stairs-Rails: Psychiatric nurse of Steps: 5 Home Layout: One level Home Equipment: Toilet riser;Hand held shower head      Prior Function Level of Independence: Independent         Comments: working at [employer] and caring for 31 month old daughter and two nephews     Hand Dominance        Extremity/Trunk Assessment   Upper Extremity Assessment Upper Extremity Assessment: Overall WFL for tasks assessed    Lower Extremity Assessment Lower Extremity Assessment: Overall WFL for tasks assessed       Communication   Communication: No difficulties  Cognition Arousal/Alertness: Awake/alert Behavior During Therapy: WFL for tasks assessed/performed Overall Cognitive Status: Within Functional Limits for tasks assessed                                        General Comments General comments (skin integrity, edema, etc.): SpO2 down to 84% on 2L O2 with HR max 128, recovered to 90% and HR 99 within 1 minute on 2L O2    Exercises     Assessment/Plan    PT Assessment Patient needs continued PT services  PT Problem List Decreased strength;Decreased mobility;Cardiopulmonary status limiting activity;Decreased activity tolerance  PT Treatment Interventions Therapeutic activities;Gait training;Therapeutic exercise;Patient/family education;Functional mobility training;Stair training    PT Goals (Current goals can be found in the Care Plan section)  Acute Rehab PT Goals Patient Stated Goal: to be as independent as possible PT Goal Formulation: With patient Time For Goal Achievement: 06/27/20 Potential to Achieve Goals: Good    Frequency Min 3X/week   Barriers to discharge        Co-evaluation               AM-PAC PT "6 Clicks" Mobility  Outcome Measure Help needed turning from your back to your side while in a flat bed without using  bedrails?: None Help needed moving from lying on your back to sitting on the side of a flat bed without using bedrails?: None Help needed moving to and from a bed to a chair (including a wheelchair)?: None Help needed standing up from a chair using your arms (e.g., wheelchair or bedside chair)?: None Help needed to walk in hospital room?: A Little Help needed climbing 3-5 steps with a railing? : A Little 6 Click Score: 22    End of Session Equipment Utilized During Treatment: Oxygen Activity Tolerance: Patient tolerated treatment well Patient left: in bed;with call bell/phone within reach   PT Visit Diagnosis: Muscle weakness (generalized) (M62.81)    Time: 7948-0165 PT Time Calculation (min) (ACUTE ONLY): 24 min   Charges:   PT Evaluation $PT Eval Low Complexity: 1 Low PT Treatments $Gait Training: 8-22 mins        Magda Kiel, PT Acute Rehabilitation Services [phone] Office:[phone] 06/13/2020   Reginia Naas 06/13/2020, 5:11 PM

## 2020-06-14 ENCOUNTER — Ambulatory Visit: Payer: BC Managed Care – PPO | Admitting: Physical Therapy

## 2020-06-14 DIAGNOSIS — Z6841 Body Mass Index (BMI) 40.0 and over, adult: Secondary | ICD-10-CM | POA: Diagnosis not present

## 2020-06-14 DIAGNOSIS — J9601 Acute respiratory failure with hypoxia: Secondary | ICD-10-CM | POA: Diagnosis not present

## 2020-06-14 DIAGNOSIS — E282 Polycystic ovarian syndrome: Secondary | ICD-10-CM | POA: Diagnosis not present

## 2020-06-14 DIAGNOSIS — G4733 Obstructive sleep apnea (adult) (pediatric): Secondary | ICD-10-CM | POA: Diagnosis not present

## 2020-06-14 DIAGNOSIS — D509 Iron deficiency anemia, unspecified: Secondary | ICD-10-CM | POA: Diagnosis not present

## 2020-06-14 DIAGNOSIS — R197 Diarrhea, unspecified: Secondary | ICD-10-CM | POA: Diagnosis not present

## 2020-06-14 DIAGNOSIS — I10 Essential (primary) hypertension: Secondary | ICD-10-CM | POA: Diagnosis not present

## 2020-06-14 DIAGNOSIS — F329 Major depressive disorder, single episode, unspecified: Secondary | ICD-10-CM | POA: Diagnosis not present

## 2020-06-14 DIAGNOSIS — K219 Gastro-esophageal reflux disease without esophagitis: Secondary | ICD-10-CM | POA: Diagnosis not present

## 2020-06-14 DIAGNOSIS — G43809 Other migraine, not intractable, without status migrainosus: Secondary | ICD-10-CM | POA: Diagnosis not present

## 2020-06-14 DIAGNOSIS — U071 COVID-19: Secondary | ICD-10-CM | POA: Diagnosis not present

## 2020-06-14 DIAGNOSIS — R112 Nausea with vomiting, unspecified: Secondary | ICD-10-CM | POA: Diagnosis not present

## 2020-06-14 LAB — FERRITIN: Ferritin: 99 ng/mL (ref 11–307)

## 2020-06-14 LAB — CBC WITH DIFFERENTIAL/PLATELET
Abs Immature Granulocytes: 0.05 10*3/uL (ref 0.00–0.07)
Basophils Absolute: 0 10*3/uL (ref 0.0–0.1)
Basophils Relative: 0 %
Eosinophils Absolute: 0 10*3/uL (ref 0.0–0.5)
Eosinophils Relative: 0 %
HCT: 41.2 % (ref 36.0–46.0)
Hemoglobin: 12.5 g/dL (ref 12.0–15.0)
Immature Granulocytes: 1 %
Lymphocytes Relative: 8 %
Lymphs Abs: 0.8 10*3/uL (ref 0.7–4.0)
MCH: 21.3 pg — ABNORMAL LOW (ref 26.0–34.0)
MCHC: 30.3 g/dL (ref 30.0–36.0)
MCV: 70.2 fL — ABNORMAL LOW (ref 80.0–100.0)
Monocytes Absolute: 0.3 10*3/uL (ref 0.1–1.0)
Monocytes Relative: 4 %
Neutro Abs: 8.4 10*3/uL — ABNORMAL HIGH (ref 1.7–7.7)
Neutrophils Relative %: 87 %
Platelets: 309 10*3/uL (ref 150–400)
RBC: 5.87 MIL/uL — ABNORMAL HIGH (ref 3.87–5.11)
RDW: 15.8 % — ABNORMAL HIGH (ref 11.5–15.5)
WBC: 9.5 10*3/uL (ref 4.0–10.5)
nRBC: 0 % (ref 0.0–0.2)

## 2020-06-14 LAB — COMPREHENSIVE METABOLIC PANEL
ALT: 31 U/L (ref 0–44)
AST: 26 U/L (ref 15–41)
Albumin: 3.3 g/dL — ABNORMAL LOW (ref 3.5–5.0)
Alkaline Phosphatase: 48 U/L (ref 38–126)
Anion gap: 11 (ref 5–15)
BUN: 11 mg/dL (ref 6–20)
CO2: 23 mmol/L (ref 22–32)
Calcium: 9 mg/dL (ref 8.9–10.3)
Chloride: 103 mmol/L (ref 98–111)
Creatinine, Ser: 0.68 mg/dL (ref 0.44–1.00)
GFR calc Af Amer: >60 mL/min (ref >60)
GFR calc non Af Amer: >60 mL/min (ref >60)
Glucose, Bld: 171 mg/dL — ABNORMAL HIGH (ref 70–99)
Potassium: 4 mmol/L (ref 3.5–5.1)
Sodium: 137 mmol/L (ref 135–145)
Total Bilirubin: 0.1 mg/dL — ABNORMAL LOW (ref 0.3–1.2)
Total Protein: 7.6 g/dL (ref 6.5–8.1)

## 2020-06-14 LAB — D-DIMER, QUANTITATIVE: D-Dimer, Quant: <0.27 ug/mL-FEU (ref 0.00–0.50)

## 2020-06-14 LAB — C-REACTIVE PROTEIN: CRP: 0.9 mg/dL (ref <1.0)

## 2020-06-14 MED ORDER — HYDROCOD POLST-CPM POLST ER 10-8 MG/5ML PO SUER
5.0000 mL | Freq: Two times a day (BID) | ORAL | Status: DC | PRN
  Filled 2020-06-14: qty 5

## 2020-06-14 MED ORDER — BENZONATATE 100 MG PO CAPS
200.0000 mg | ORAL_CAPSULE | Freq: Three times a day (TID) | ORAL | Status: DC
  Administered 2020-06-14 – 2020-06-15 (×4): 200 mg via ORAL
  Filled 2020-06-14 (×3): qty 2

## 2020-06-14 MED ORDER — FENTANYL CITRATE (PF) 100 MCG/2ML IJ SOLN
25.0000 ug | INTRAMUSCULAR | Status: AC | PRN
  Administered 2020-06-14 (×2): 50 ug via INTRAVENOUS
  Filled 2020-06-14 (×2): qty 2

## 2020-06-14 NOTE — Plan of Care (Signed)

## 2020-06-14 NOTE — Progress Notes (Signed)
PROGRESS NOTE                                                                                                                                                                                                             Patient Demographics:    Maureen Lewis, is a 24 y.o. female, DOB - January 23, 1996, WVP:[id number]  Outpatient Primary MD for the patient is Dorothyann Peng, NP   Admit date - 06/11/2020   LOS - 3  Chief Complaint  Patient presents with   Shortness of Breath       Brief Narrative: Patient is a 24 y.o. female with PMHx of obesity, HTN, migraine headaches-who presented with shortness of breath-found to have pneumonia and hypoxic respiratory failure due to COVID-19.  She tested positive for COVID-19 on 8/21.  COVID-19 vaccinated status: Unvaccinated  Significant Events: 8/21>> COVID-19 positive 8/23>> Admit to [facility] for hypoxia due to COVID-19 pneumonia  Significant studies: 8/23>> CTA chest: No PE-multifocal pneumonia. 8/23>>Chest x-ray: Bilateral lung opacities right greater than left.  COVID-19 medications: Steroids: 8/23>> Remdesivir: 8/23>>  Antibiotics: None  Microbiology data: 8/23 >>blood culture: No growth  Procedures: None  Consults: None  DVT prophylaxis: SCDs Start: 06/11/20 1325 Lovenox at prophylactic dose     Subjective:   Continues to cough-p.m. she ambulated 5 laps with therapy yesterday-and was short of breath after the fifth lab.   Assessment  & Plan :   Acute Hypoxic Resp Failure due to Covid 19 Viral pneumonia: Slowly improving-mild hypoxemia-she was titrated down to half a liter of oxygen this morning.  No longer on remdesivir due to episode of angioedema on 8/25.  Continue steroids-CRP has normalized-continue supportive care.  If clinical improvement continues-suspect home on 8/27.    Fever: afebrile O2 requirements:  SpO2: 96 % O2 Flow Rate (L/min): 1 L/min    COVID-19 Labs: Recent Labs    06/12/20 0456 06/13/20 0059 06/14/20 0436  DDIMER 0.53* 0.43 <0.27  FERRITIN 116 151 99  CRP 10.3* 3.3* 0.9       Component Value Date/Time   BNP 10.9 06/11/2020 1327    Recent Labs  Lab 06/11/20 1220  PROCALCITON 1.01    Lab Results  Component Value Date   SARSCOV2NAA POSITIVE (A) 06/09/2020   [place] NEGATIVE 05/24/2020   [place] Not Detected 03/26/2020   [place] NEGATIVE 10/05/2019  Prone/Incentive Spirometry: encouraged  incentive spirometry use 3-4/hour.  Angioedema: Occurred on 8/25-mild-treated promptly with steroids/Benadryl/Pepcid-this occurred while Remdesivir infusion was just started.  No longer on Remdesivir.  Right facial swelling/throat swelling has resolved.    HTN: Stable-continue amlodipine and HCTZ.  GERD: Continue PPI  Migraine headaches: Stable-continue Topamax  Microcytic anemia: Chronic issue-stable for outpatient monitoring.  Morbid Obesity: Estimated body mass index is 59.81 kg/m as calculated from the following:   Height as of this encounter: 6' (1.829 m).   Weight as of this encounter: 200 kg.    ABG:    Component Value Date/Time   TCO2 26 08/20/2017 1459    Vent Settings: N/A  Condition - Stable  Family Communication  : Patient will call different family member herself-her mother/grandmother in the hospital.  Called grandmother-Chalestin Herbin [phone] per patient's request on 8/24.  I called pt's grandmother on 8/25-someone else answer the phone-apparently she is in the emergency room.    Per patient-her mother is intubated and in the ICU.  Code Status :  Full Code  Diet :  Diet Order            Diet 2 gram sodium Room service appropriate? Yes; Fluid consistency: Thin  Diet effective now                  Disposition Plan  :   Status is: Inpatient  Remains inpatient appropriate because:Inpatient level of care appropriate due to severity of  illness   Dispo: The patient is from: Home              Anticipated d/c is to: Home              Anticipated d/c date is: 2 days              Patient currently is not medically stable to d/c.  Barriers to discharge: Hypoxia requiring O2 supplementation  Antimicorbials  :    Anti-infectives (From admission, onward)   Start     Dose/Rate Route Frequency Ordered Stop   06/12/20 1000  remdesivir 100 mg in sodium chloride 0.9 % 100 mL IVPB  Status:  Discontinued       "Followed by" Linked Group Details   100 mg 200 mL/hr over 30 Minutes Intravenous Daily 06/11/20 1327 06/13/20 1415   06/11/20 1400  remdesivir 200 mg in sodium chloride 0.9% 250 mL IVPB       "Followed by" Linked Group Details   200 mg 580 mL/hr over 30 Minutes Intravenous Once 06/11/20 1327 06/11/20 1451      Inpatient Medications  Scheduled Meds:  albuterol  2 puff Inhalation Q6H   amLODipine  10 mg Oral Daily   vitamin C  500 mg Oral Daily   benzonatate  200 mg Oral TID   enoxaparin (LOVENOX) injection  100 mg Subcutaneous Q24H   ferrous sulfate  325 mg Oral QODAY   hydrochlorothiazide  25 mg Oral Daily   methylPREDNISolone (SOLU-MEDROL) injection  0.5 mg/kg Intravenous Q12H   pantoprazole  40 mg Oral Daily   topiramate  50 mg Oral BID   zinc sulfate  220 mg Oral Daily   Continuous Infusions:  PRN Meds:.acetaminophen, alum & mag hydroxide-simeth, baclofen, bisacodyl, diphenhydrAMINE, EPINEPHrine, fentaNYL (SUBLIMAZE) injection, ondansetron **OR** ondansetron (ZOFRAN) IV, senna   Time Spent in minutes  25  See all Orders from today for further details   Oren Binet M.D on 06/14/2020 at 2:24 PM  To page go to [email or web address] -  use universal password  [facility] -  Office  [phone]    Objective:   Vitals:   06/13/20 1258 06/13/20 2043 06/14/20 0107 06/14/20 0802  BP: 138/83 (!) 142/75  132/87  Pulse: 88 95  83  Resp: (!) 25 20  16   Temp:  98 F (36.7 C)  98.6 F (37 C)   TempSrc:  Oral    SpO2: 98% 94% 92% 96%  Weight:      Height:        Wt Readings from Last 3 Encounters:  06/11/20 (!) 200 kg  06/06/20 (!) 200 kg  05/25/20 (!) 204.1 kg     Intake/Output Summary (Last 24 hours) at 06/14/2020 1424 Last data filed at 06/14/2020 0100 Gross per 24 hour  Intake 770 ml  Output --  Net 770 ml     Physical Exam Gen Exam:Alert awake-not in any distress HEENT:atraumatic, normocephalic Chest: B/L clear to auscultation anteriorly CVS:S1S2 regular Abdomen:soft non tender, non distended Extremities:no edema Neurology: Non focal Skin: no rash   Data Review:    CBC Recent Labs  Lab 06/11/20 1220 06/12/20 0456 06/13/20 0059 06/14/20 0436  WBC 4.8 2.6* 8.9 9.5  HGB 12.1 12.1 12.9 12.5  HCT 40.1 40.6 43.2 41.2  PLT 206 220 302 309  MCV 72.4* 72.8* 70.5* 70.2*  MCH 21.8* 21.7* 21.0* 21.3*  MCHC 30.2 29.8* 29.9* 30.3  RDW 15.8* 15.8* 15.9* 15.8*  LYMPHSABS 0.8 0.5* 0.8 0.8  MONOABS 0.1 0.1 0.3 0.3  EOSABS 0.0 0.0 0.0 0.0  BASOSABS 0.0 0.0 0.0 0.0    Chemistries  Recent Labs  Lab 06/11/20 1220 06/12/20 0456 06/13/20 0059 06/14/20 0436  NA 137 136 138 137  K 3.7 3.6 4.0 4.0  CL 102 102 104 103  CO2 23 22 23 23   GLUCOSE 124* 191* 176* 171*  BUN <5* 5* 9 11  CREATININE 0.77 0.64 0.72 0.68  CALCIUM 8.6* 8.9 9.5 9.0  MG  --  2.0  --   --   AST 53* 40 31 26  ALT 37 35 32 31  ALKPHOS 51 49 50 48  BILITOT 0.5 0.5 0.3 0.1*   ------------------------------------------------------------------------------------------------------------------ No results for input(s): CHOL, HDL, LDLCALC, TRIG, CHOLHDL, LDLDIRECT in the last 72 hours.  Lab Results  Component Value Date   HGBA1C 6.0 (H) 04/09/2020   ------------------------------------------------------------------------------------------------------------------ No results for input(s): TSH, T4TOTAL, T3FREE, THYROIDAB in the last 72 hours.  Invalid input(s):  FREET3 ------------------------------------------------------------------------------------------------------------------ Recent Labs    06/13/20 0059 06/14/20 0436  FERRITIN 151 99    Coagulation profile No results for input(s): INR, PROTIME in the last 168 hours.  Recent Labs    06/13/20 0059 06/14/20 0436  DDIMER 0.43 <0.27    Cardiac Enzymes No results for input(s): CKMB, TROPONINI, MYOGLOBIN in the last 168 hours.  Invalid input(s): CK ------------------------------------------------------------------------------------------------------------------    Component Value Date/Time   BNP 10.9 06/11/2020 1327    Micro Results Recent Results (from the past 240 hour(s))  SARS CORONAVIRUS 2 (TAT 6-24 HRS) Nasopharyngeal Nasopharyngeal Swab     Status: Abnormal   Collection Time: 06/09/20  6:53 PM   Specimen: Nasopharyngeal Swab  Result Value Ref Range Status   SARS Coronavirus 2 POSITIVE (A) NEGATIVE Final    Comment: [employer] @ [place] on 06/10/2020 by Braxton Feathers (NOTE) SARS-CoV-2 target nucleic acids are DETECTED.  The SARS-CoV-2 RNA is generally detectable in upper and lower respiratory specimens during the acute phase of infection. Positive results are indicative  of the presence of SARS-CoV-2 RNA. Clinical correlation with patient history and other diagnostic information is  necessary to determine patient infection status. Positive results do not rule out bacterial infection or co-infection with other viruses.  The expected result is Negative.  Fact Sheet for Patients: SugarRoll.be  Fact Sheet for Healthcare Providers: https://www.woods-mathews.com/  This test is not yet approved or cleared by the [place] FDA and  has been authorized for detection and/or diagnosis of SARS-CoV-2 by FDA under an Emergency Use Authorization (EUA). This EUA will remain  in effect (meaning this test can be used) for the  duration of the COVI D-19 declaration under Section 564(b)(1) of the Act, 21 U.S.C. section 360bbb-3(b)(1), unless the authorization is terminated or revoked sooner.   Performed at [facility], [place] [address], [place], [place] [address]   Blood Culture (routine x 2)     Status: None (Preliminary result)   Collection Time: 06/11/20 12:20 PM   Specimen: BLOOD LEFT HAND  Result Value Ref Range Status   Specimen Description BLOOD LEFT HAND  Final   Special Requests   Final    BOTTLES DRAWN AEROBIC AND ANAEROBIC Blood Culture results may not be optimal due to an inadequate volume of blood received in culture bottles   Culture   Final    NO GROWTH 3 DAYS Performed at [facility], [place] [address], [place], [place] [address]    Report Status PENDING  Incomplete  Blood Culture (routine x 2)     Status: None (Preliminary result)   Collection Time: 06/11/20 12:35 PM   Specimen: BLOOD  Result Value Ref Range Status   Specimen Description BLOOD RIGHT ANTECUBITAL  Final   Special Requests   Final    BOTTLES DRAWN AEROBIC ONLY Blood Culture results may not be optimal due to an inadequate volume of blood received in culture bottles   Culture   Final    NO GROWTH 3 DAYS Performed at [facility], [place] [address] [place], [place] [address]    Report Status PENDING  Incomplete    Radiology Reports DG Chest 2 View  Result Date: 06/09/2020 CLINICAL DATA:  Recent bronchitis.  Worsening for 3 days with fever. EXAM: CHEST - 2 VIEW COMPARISON:  May 30, 2020 FINDINGS: No pneumothorax. Suggested subtle opacities in the bases. Cardiomegaly. The hila and mediastinum are normal. No pneumothorax. No other acute abnormalities. IMPRESSION: Suggested subtle bibasilar opacities, right greater than left, are concerning for pneumonia given the history of COVID-19 exposure and cough. Electronically Signed   By: Dorise Bullion III M.D   On: 06/09/2020 17:08   DG Chest 2 View  Result Date:  05/31/2020 CLINICAL DATA:  Left anterior chest pain 3 days.  Cough. EXAM: CHEST - 2 VIEW COMPARISON:  02/16/2020 FINDINGS: Lungs are adequately inflated without focal airspace consolidation or effusion. Cardiomediastinal silhouette and bony structures are within normal. Moderate overlying soft tissues. IMPRESSION: No active cardiopulmonary disease. Electronically Signed   By: Marin Olp M.D.   On: 05/31/2020 14:43   CT ANGIO CHEST PE W OR WO CONTRAST  Result Date: 06/11/2020 CLINICAL DATA:  COVID-19 positivity with shortness of breath and chest pain, initial encounter EXAM: CT ANGIOGRAPHY CHEST WITH CONTRAST TECHNIQUE: Multidetector CT imaging of the chest was performed using the standard protocol during bolus administration of intravenous contrast. Multiplanar CT image reconstructions and MIPs were obtained to evaluate the vascular anatomy. CONTRAST:  55mL OMNIPAQUE IOHEXOL 350 MG/ML SOLN COMPARISON:  Chest x-ray from earlier in the same day. FINDINGS: Cardiovascular: Heart is mildly enlarged in size. The thoracic aorta and its branches are within normal limits. The pulmonary artery is incompletely evaluated due to poor timing of the contrast bolus likely related to patient's body habitus. No large central pulmonary embolus is noted. No coronary calcifications are noted. No pericardial effusion is noted. Mediastinum/Nodes: Thoracic inlet is within normal limits. No sizable hilar or mediastinal adenopathy is noted. The esophagus as visualized is within normal limits. Lungs/Pleura: Lungs are well aerated bilaterally but demonstrate bilateral ground-glass opacities consistent with the given clinical history of COVID-19 positivity. No sizable effusion is seen. No pneumothorax is noted. Upper Abdomen: Visualized upper abdomen is unremarkable. Musculoskeletal: No acute bony abnormality is noted. Review of the MIP images confirms the above findings. IMPRESSION: Changes consistent with multifocal COVID-19 pneumonia.  No definitive pulmonary emboli are seen although the opacification of the pulmonary artery is somewhat limited due to timing of the contrast bolus in the patient's body habitus. Cardiac enlargement. Electronically Signed   By: Inez Catalina M.D.   On: 06/11/2020 16:03   DG Chest Port 1 View  Result Date: 06/11/2020 CLINICAL DATA:  Shortness of breath. EXAM: PORTABLE CHEST 1 VIEW COMPARISON:  June 09, 2020. FINDINGS: Stable cardiomediastinal silhouette. No pneumothorax pleural effusion is noted. Stable bilateral lung opacities are noted, right greater than left, concerning for multifocal pneumonia. Bony thorax is unremarkable. IMPRESSION: Stable bilateral lung opacities, right greater than left, concerning for multifocal pneumonia. Electronically Signed   By: Marijo Conception M.D.   On: 06/11/2020 12:36

## 2020-06-14 NOTE — Evaluation (Signed)
Occupational Therapy Evaluation Patient Details Name: Maureen Lewis MRN: [id number] DOB: May 26, 1996 Today's Date: 06/14/2020    History of Present Illness Patient is a 24 y.o. female with PMHx of obesity, HTN, migraine headaches-who presented with shortness of breath-found to have pneumonia and hypoxic respiratory failure due to COVID-19.  She tested positive for COVID-19 on 8/21.   Clinical Impression   PTA, pt lives with family and reports Independence with all daily tasks, caring for children and works full time. Pt presents now close to baseline with mild deficits in cardiopulmonary tolerance. Pt received on 0.5 L O2, trialed activities on RA. Pt very pleasant and motivated to return home. Pt demonstrated ability to complete all ADLs and short distance mobility without AD, Modified Independent to Independent with appropriate mgmt of lines. Pt lowest SpO2 was 87%, sustained 89-92% during activity on RA. Pt demonstrated use of flutter valve/IS with coughing noted. Educated pt on energy conservation strategies to implement at home with pt verbalizing understanding.  Pt left in chair on RA with all needs in reach. No further skilled OT services indicated at acute level. OT to sign off.     Follow Up Recommendations  No OT follow up    Equipment Recommendations  None recommended by OT    Recommendations for Other Services       Precautions / Restrictions Precautions Precautions: Other (comment) Precaution Comments: watch O2 Restrictions Weight Bearing Restrictions: No      Mobility Bed Mobility Overal bed mobility: Modified Independent             General bed mobility comments: up and down from tall barimax bed  Transfers Overall transfer level: Modified independent Equipment used: None Transfers: Sit to/from [facility] Sit to Stand: Modified independent (Device/Increase time) Stand pivot transfers: Modified independent (Device/Increase time)        General transfer comment: no assist needed, managing lines without much assist    Balance Overall balance assessment: Modified Independent                                         ADL either performed or assessed with clinical judgement   ADL Overall ADL's : Modified independent                                       General ADL Comments: Pt Modified Independent to Independent for dressing, toilet transfer, toileting task and hand hygiene standing at sink. Pt able to mobilize around room without AD or assistance     Vision Baseline Vision/History: No visual deficits Patient Visual Report: No change from baseline Vision Assessment?: No apparent visual deficits     Perception     Praxis      Pertinent Vitals/Pain Pain Assessment: No/denies pain     Hand Dominance Right   Extremity/Trunk Assessment Upper Extremity Assessment Upper Extremity Assessment: Overall WFL for tasks assessed   Lower Extremity Assessment Lower Extremity Assessment: Defer to PT evaluation   Cervical / Trunk Assessment Cervical / Trunk Assessment: Normal   Communication Communication Communication: No difficulties   Cognition Arousal/Alertness: Awake/alert Behavior During Therapy: WFL for tasks assessed/performed Overall Cognitive Status: Within Functional Limits for tasks assessed  General Comments  Pt on 0.5 L O2. Trialed on RA with lowest O2 reading at 87%, sustained 89-92% throughout, no SOB. RN entering and assisted in IS/flutter valve use with pt return demonstrating, coughing noted    Exercises     Shoulder Instructions      Home Living Family/patient expects to be discharged to:: Private residence Living Arrangements: Parent Available Help at Discharge: Family;Friend(s) Type of Home: House Home Access: Stairs to enter Technical brewer of Steps: 5 Entrance Stairs-Rails: Right;Left Home  Layout: One level     Bathroom Shower/Tub: Teacher, early years/pre: Standard     Home Equipment: Toilet riser;Hand held shower head          Prior Functioning/Environment Level of Independence: Independent        Comments: Working at [employer], no use of [place]. Caring for 39 month, 24 year old, 3 year olo        OT Problem List:        OT Treatment/Interventions:      OT Goals(Current goals can be found in the care plan section) Acute Rehab OT Goals Patient Stated Goal: to be as independent as possible  OT Frequency:     Barriers to D/C:            Co-evaluation              AM-PAC OT "6 Clicks" Daily Activity     Outcome Measure Help from another person eating meals?: None Help from another person taking care of personal grooming?: None Help from another person toileting, which includes using toliet, bedpan, or urinal?: None Help from another person bathing (including washing, rinsing, drying)?: None Help from another person to put on and taking off regular upper body clothing?: None Help from another person to put on and taking off regular lower body clothing?: None 6 Click Score: 24   End of Session Equipment Utilized During Treatment: Oxygen Nurse Communication: Mobility status  Activity Tolerance: Patient tolerated treatment well Patient left: in chair;with call bell/phone within reach                   Time: 0981-1914 OT Time Calculation (min): 30 min Charges:  OT General Charges $OT Visit: 1 Visit OT Evaluation $OT Eval Moderate Complexity: 1 Mod OT Treatments $Self Care/Home Management : 8-22 mins  Layla Maw, OTR/L  Layla Maw 06/14/2020, 3:50 PM

## 2020-06-15 DIAGNOSIS — I10 Essential (primary) hypertension: Secondary | ICD-10-CM

## 2020-06-15 DIAGNOSIS — G43809 Other migraine, not intractable, without status migrainosus: Secondary | ICD-10-CM | POA: Diagnosis not present

## 2020-06-15 DIAGNOSIS — J9601 Acute respiratory failure with hypoxia: Secondary | ICD-10-CM | POA: Diagnosis not present

## 2020-06-15 DIAGNOSIS — U071 COVID-19: Secondary | ICD-10-CM | POA: Diagnosis not present

## 2020-06-15 DIAGNOSIS — K219 Gastro-esophageal reflux disease without esophagitis: Secondary | ICD-10-CM | POA: Diagnosis not present

## 2020-06-15 LAB — COMPREHENSIVE METABOLIC PANEL
ALT: 31 U/L (ref 0–44)
AST: 27 U/L (ref 15–41)
Albumin: 3.4 g/dL — ABNORMAL LOW (ref 3.5–5.0)
Alkaline Phosphatase: 52 U/L (ref 38–126)
Anion gap: 14 (ref 5–15)
BUN: 12 mg/dL (ref 6–20)
CO2: 17 mmol/L — ABNORMAL LOW (ref 22–32)
Calcium: 9.1 mg/dL (ref 8.9–10.3)
Chloride: 104 mmol/L (ref 98–111)
Creatinine, Ser: 0.7 mg/dL (ref 0.44–1.00)
GFR calc Af Amer: >60 mL/min (ref >60)
GFR calc non Af Amer: >60 mL/min (ref >60)
Glucose, Bld: 229 mg/dL — ABNORMAL HIGH (ref 70–99)
Potassium: 4.5 mmol/L (ref 3.5–5.1)
Sodium: 135 mmol/L (ref 135–145)
Total Bilirubin: 0.5 mg/dL (ref 0.3–1.2)
Total Protein: 7.8 g/dL (ref 6.5–8.1)

## 2020-06-15 LAB — C-REACTIVE PROTEIN: CRP: 0.5 mg/dL (ref <1.0)

## 2020-06-15 LAB — CBC WITH DIFFERENTIAL/PLATELET
Abs Immature Granulocytes: 0.06 10*3/uL (ref 0.00–0.07)
Basophils Absolute: 0 10*3/uL (ref 0.0–0.1)
Basophils Relative: 0 %
Eosinophils Absolute: 0 10*3/uL (ref 0.0–0.5)
Eosinophils Relative: 0 %
HCT: 42.8 % (ref 36.0–46.0)
Hemoglobin: 13.1 g/dL (ref 12.0–15.0)
Immature Granulocytes: 1 %
Lymphocytes Relative: 12 %
Lymphs Abs: 1.3 10*3/uL (ref 0.7–4.0)
MCH: 21.7 pg — ABNORMAL LOW (ref 26.0–34.0)
MCHC: 30.6 g/dL (ref 30.0–36.0)
MCV: 70.7 fL — ABNORMAL LOW (ref 80.0–100.0)
Monocytes Absolute: 0.5 10*3/uL (ref 0.1–1.0)
Monocytes Relative: 5 %
Neutro Abs: 8.5 10*3/uL — ABNORMAL HIGH (ref 1.7–7.7)
Neutrophils Relative %: 82 %
Platelets: 355 10*3/uL (ref 150–400)
RBC: 6.05 MIL/uL — ABNORMAL HIGH (ref 3.87–5.11)
RDW: 16.7 % — ABNORMAL HIGH (ref 11.5–15.5)
WBC: 10.3 10*3/uL (ref 4.0–10.5)
nRBC: 0 % (ref 0.0–0.2)

## 2020-06-15 LAB — FERRITIN: Ferritin: 92 ng/mL (ref 11–307)

## 2020-06-15 LAB — D-DIMER, QUANTITATIVE: D-Dimer, Quant: 0.36 ug/mL-FEU (ref 0.00–0.50)

## 2020-06-15 MED ORDER — PREDNISONE 10 MG PO TABS: ORAL_TABLET | ORAL | 0 refills | Status: DC

## 2020-06-15 MED ORDER — PHENOL 1.4 % MT LIQD
1.0000 | OROMUCOSAL | Status: DC | PRN
  Filled 2020-06-15: qty 177

## 2020-06-15 MED ORDER — BENZONATATE 200 MG PO CAPS: 200.0000 mg | ORAL_CAPSULE | Freq: Three times a day (TID) | ORAL | 0 refills | Status: DC | PRN

## 2020-06-15 MED ORDER — ALBUTEROL SULFATE HFA 108 (90 BASE) MCG/ACT IN AERS: 2.0000 | INHALATION_SPRAY | Freq: Four times a day (QID) | RESPIRATORY_TRACT | 0 refills | Status: DC | PRN

## 2020-06-15 MED ORDER — BENZONATATE 200 MG PO CAPS: 200.0000 mg | ORAL_CAPSULE | Freq: Three times a day (TID) | ORAL | 0 refills | Status: AC | PRN

## 2020-06-15 MED FILL — predniSONE 10 MG TABS: 10 | 10 days supply | Qty: 10 | Fill #0

## 2020-06-15 MED FILL — PROAIR HFA 90 MCG INHALER: 108 (90 BAS | 25 days supply | Qty: 9 | Fill #0

## 2020-06-15 MED FILL — BENZONATATE 200 MG CAPS: 200 | 7 days supply | Qty: 21 | Fill #0

## 2020-06-15 NOTE — Discharge Summary (Signed)
 PATIENT DETAILS Name: Maureen Lewis Age: 24 y.o. Sex: female Date of Birth: 04/25/1996 MRN: [id number]. Admitting Physician: Rinka R Pahwani, MD PCP:Nafziger, Cory, NP  Admit Date: 06/11/2020 Discharge date: 06/15/2020  Recommendations for Outpatient Follow-up:  1. Follow up with PCP in 1-2 weeks 2. Please obtain CMP/CBC in one week 3. Repeat Chest Xray in 4-6 week  Admitted From:  Home  Disposition: Home    Home Health: No  Equipment/Devices: None  Discharge Condition: Stable  CODE STATUS: FULL CODE  Diet recommendation:  Diet Order            Diet - low sodium heart healthy           Diet 2 gram sodium Room service appropriate? Yes; Fluid consistency: Thin  Diet effective now                  Brief Narrative: Patient is a 24 y.o. female with PMHx of obesity, HTN, migraine headaches-who presented with shortness of breath-found to have pneumonia and hypoxic respiratory failure due to COVID-19.  She tested positive for COVID-19 on 8/21.  COVID-19 vaccinated status: Unvaccinated  Significant Events: 8/21>> COVID-19 positive 8/23>> Admit to [facility] for hypoxia due to COVID-19 pneumonia  Significant studies: 8/23>> CTA chest: No PE-multifocal pneumonia. 8/23>>Chest x-ray: Bilateral lung opacities right greater than left.  COVID-19 medications: Steroids: 8/23>> Remdesivir: 8/23>>8/25  Antibiotics: None  Microbiology data: 8/23 >>blood culture: No growth  Procedures: None  Consults: None  Brief Hospital Course: Acute Hypoxic Resp Failure due to Covid 19 Viral pneumonia:  Has improved-titrated to room air since yesterday-did not require oxygen when working with occupational therapy yesterday.  CRP has normalized.  Treated with steroids-unfortunately had angioedema after Remdesivir-and this was discontinued.  Will go home on tapering steroids.    COVID-19 Labs:  Recent Labs    06/13/20 0059 06/14/20 0436 06/15/20 0248  DDIMER 0.43  <0.27 0.36  FERRITIN 151 99 92  CRP 3.3* 0.9 0.5    Lab Results  Component Value Date   SARSCOV2NAA POSITIVE (A) 06/09/2020   SARSCOV2NAA NEGATIVE 05/24/2020   SARSCOV2NAA Not Detected 03/26/2020   SARSCOV2NAA NEGATIVE 10/05/2019     Angioedema: Occurred on 8/25-mild-treated promptly with steroids/Benadryl/Pepcid-this occurred while Remdesivir infusion was just started.  No longer on Remdesivir.  Right facial swelling/throat swelling has resolved.    HTN: Stable-continue amlodipine and HCTZ.  GERD: Continue PPI  Migraine headaches: Stable-continue Topamax  Microcytic anemia: Chronic issue-stable for outpatient monitoring.  Morbid Obesity: Estimated body mass index is 59.81 kg/m as calculated from the following:   Height as of this encounter: 6' (1.829 m).   Weight as of this encounter: 200 kg.    Discharge Diagnoses:  Principal Problem:   Acute hypoxemic respiratory failure due to COVID-19 (HCC) Active Problems:   GERD (gastroesophageal reflux disease)   OSA (obstructive sleep apnea)   Depression   Class 3 severe obesity with serious comorbidity and body mass index (BMI) of 50.0 to 59.9 in adult (HCC)   Microcytic anemia   Migraine headache   HTN (hypertension)   Discharge Instructions:    Person Under Monitoring Name: Maureen Lewis  Location: [address] [place] [address]   Infection Prevention Recommendations for Individuals Confirmed to have, or Being Evaluated for, 2019 Novel Coronavirus (COVID-19) Infection Who Receive Care at Home  Individuals who are confirmed to have, or are being evaluated for, COVID-19 should follow the prevention steps below until a healthcare provider or local or state health   department says they can return to normal activities.  Stay home except to get medical care You should restrict activities outside your home, except for getting medical care. Do not go to work, school, or public areas, and do not use public  transportation or taxis.  Call ahead before visiting your doctor Before your medical appointment, call the healthcare provider and tell them that you have, or are being evaluated for, COVID-19 infection. This will help the healthcare provider's office take steps to keep other people from getting infected. Ask your healthcare provider to call the local or state health department.  Monitor your symptoms Seek prompt medical attention if your illness is worsening (e.g., difficulty breathing). Before going to your medical appointment, call the healthcare provider and tell them that you have, or are being evaluated for, COVID-19 infection. Ask your healthcare provider to call the local or state health department.  Wear a facemask You should wear a facemask that covers your nose and mouth when you are in the same room with other people and when you visit a healthcare provider. People who live with or visit you should also wear a facemask while they are in the same room with you.  Separate yourself from other people in your home As much as possible, you should stay in a different room from other people in your home. Also, you should use a separate bathroom, if available.  Avoid sharing household items You should not share dishes, drinking glasses, cups, eating utensils, towels, bedding, or other items with other people in your home. After using these items, you should wash them thoroughly with soap and water.  Cover your coughs and sneezes Cover your mouth and nose with a tissue when you cough or sneeze, or you can cough or sneeze into your sleeve. Throw used tissues in a lined trash can, and immediately wash your hands with soap and water for at least 20 seconds or use an alcohol-based hand rub.  Wash your [facility] your hands often and thoroughly with soap and water for at least 20 seconds. You can use an alcohol-based hand sanitizer if soap and water are not available and if your hands  are not visibly dirty. Avoid touching your eyes, nose, and mouth with unwashed hands.   Prevention Steps for Caregivers and Household Members of Individuals Confirmed to have, or Being Evaluated for, COVID-19 Infection Being Cared for in the Home  If you live with, or provide care at home for, a person confirmed to have, or being evaluated for, COVID-19 infection please follow these guidelines to prevent infection:  Follow healthcare provider's instructions Make sure that you understand and can help the patient follow any healthcare provider instructions for all care.  Provide for the patient's basic needs You should help the patient with basic needs in the home and provide support for getting groceries, prescriptions, and other personal needs.  Monitor the patient's symptoms If they are getting sicker, call his or her medical provider and tell them that the patient has, or is being evaluated for, COVID-19 infection. This will help the healthcare provider's office take steps to keep other people from getting infected. Ask the healthcare provider to call the local or state health department.  Limit the number of people who have contact with the patient  If possible, have only one caregiver for the patient.  Other household members should stay in another home or place of residence. If this is not possible, they should stay  in another room,  or be separated from the patient as much as possible. Use a separate bathroom, if available.  Restrict visitors who do not have an essential need to be in the home.  Keep older adults, very young children, and other sick people away from the patient Keep older adults, very young children, and those who have compromised immune systems or chronic health conditions away from the patient. This includes people with chronic heart, lung, or kidney conditions, diabetes, and cancer.  Ensure good ventilation Make sure that shared spaces in the home have  good air flow, such as from an air conditioner or an opened window, weather permitting.  Wash your hands often  Wash your hands often and thoroughly with soap and water for at least 20 seconds. You can use an alcohol based hand sanitizer if soap and water are not available and if your hands are not visibly dirty.  Avoid touching your eyes, nose, and mouth with unwashed hands.  Use disposable paper towels to dry your hands. If not available, use dedicated cloth towels and replace them when they become wet.  Wear a facemask and gloves  Wear a disposable facemask at all times in the room and gloves when you touch or have contact with the patient's blood, body fluids, and/or secretions or excretions, such as sweat, saliva, sputum, nasal mucus, vomit, urine, or feces.  Ensure the mask fits over your nose and mouth tightly, and do not touch it during use.  Throw out disposable facemasks and gloves after using them. Do not reuse.  Wash your hands immediately after removing your facemask and gloves.  If your personal clothing becomes contaminated, carefully remove clothing and launder. Wash your hands after handling contaminated clothing.  Place all used disposable facemasks, gloves, and other waste in a lined container before disposing them with other household waste.  Remove gloves and wash your hands immediately after handling these items.  Do not share dishes, glasses, or other household items with the patient  Avoid sharing household items. You should not share dishes, drinking glasses, cups, eating utensils, towels, bedding, or other items with a patient who is confirmed to have, or being evaluated for, COVID-19 infection.  After the person uses these items, you should wash them thoroughly with soap and water.  Wash laundry thoroughly  Immediately remove and wash clothes or bedding that have blood, body fluids, and/or secretions or excretions, such as sweat, saliva, sputum, nasal  mucus, vomit, urine, or feces, on them.  Wear gloves when handling laundry from the patient.  Read and follow directions on labels of laundry or clothing items and detergent. In general, wash and dry with the warmest temperatures recommended on the label.  Clean all areas the individual has used often  Clean all touchable surfaces, such as counters, tabletops, doorknobs, bathroom fixtures, toilets, phones, keyboards, tablets, and bedside tables, every day. Also, clean any surfaces that may have blood, body fluids, and/or secretions or excretions on them.  Wear gloves when cleaning surfaces the patient has come in contact with.  Use a diluted bleach solution (e.g., dilute bleach with 1 part bleach and 10 parts water) or a household disinfectant with a label that says EPA-registered for coronaviruses. To make a bleach solution at home, add 1 tablespoon of bleach to 1 quart (4 cups) of water. For a larger supply, add  cup of bleach to 1 gallon (16 cups) of water.  Read labels of cleaning products and follow recommendations provided on product labels. Labels contain instructions  for safe and effective use of the cleaning product including precautions you should take when applying the product, such as wearing gloves or eye protection and making sure you have good ventilation during use of the product.  Remove gloves and wash hands immediately after cleaning.  Monitor yourself for signs and symptoms of illness Caregivers and household members are considered close contacts, should monitor their health, and will be asked to limit movement outside of the home to the extent possible. Follow the monitoring steps for close contacts listed on the symptom monitoring form.   ? If you have additional questions, contact your local health department or call the epidemiologist on call at [phone] (available 24/7). ? This guidance is subject to change. For the most up-to-date guidance from CDC, please  refer to their website: YouBlogs.pl    Activity:  As tolerated   Discharge Instructions    Call MD for:  difficulty breathing, headache or visual disturbances   Complete by: As directed    Diet - low sodium heart healthy   Complete by: As directed    Discharge instructions   Complete by: As directed    Follow with Primary MD  Dorothyann Peng, NP in 1-2 weeks  3 weeks of isolation from 8/21  Please seek immediate medical attention if you develop worsening shortness of breath.  Please ask your primary care practitioner to repeat chest x-ray in 4 weeks.  Please get a complete blood count and chemistry panel checked by your Primary MD at your next visit, and again as instructed by your Primary MD.  Get Medicines reviewed and adjusted: Please take all your medications with you for your next visit with your Primary MD  Laboratory/radiological data: Please request your Primary MD to go over all hospital tests and procedure/radiological results at the follow up, please ask your Primary MD to get all Hospital records sent to his/her office.  In some cases, they will be blood work, cultures and biopsy results pending at the time of your discharge. Please request that your primary care M.D. follows up on these results.  Also Note the following: If you experience worsening of your admission symptoms, develop shortness of breath, life threatening emergency, suicidal or homicidal thoughts you must seek medical attention immediately by calling 911 or calling your MD immediately  if symptoms less severe.  You must read complete instructions/literature along with all the possible adverse reactions/side effects for all the Medicines you take and that have been prescribed to you. Take any new Medicines after you have completely understood and accpet all the possible adverse reactions/side effects.   Do not drive when taking Pain  medications or sleeping medications (Benzodaizepines)  Do not take more than prescribed Pain, Sleep and Anxiety Medications. It is not advisable to combine anxiety,sleep and pain medications without talking with your primary care practitioner  Special Instructions: If you have smoked or chewed Tobacco  in the last 2 yrs please stop smoking, stop any regular Alcohol  and or any Recreational drug use.  Wear Seat belts while driving.  Please note: You were cared for by a hospitalist during your hospital stay. Once you are discharged, your primary care physician will handle any further medical issues. Please note that NO REFILLS for any discharge medications will be authorized once you are discharged, as it is imperative that you return to your primary care physician (or establish a relationship with a primary care physician if you do not have one) for your post hospital discharge  needs so that they can reassess your need for medications and monitor your lab values.   Increase activity slowly   Complete by: As directed      Allergies as of 06/15/2020      Reactions   Bee Pollen Hives, Shortness Of Breath, Swelling   Bee Venom Hives, Shortness Of Breath, Swelling   Hydrocodone-acetaminophen Anaphylaxis   No problem when she takes Tylenol   Peach Flavor Hives, Shortness Of Breath, Other (See Comments)   SWELLING OF MOUTH   Pollen Extract Hives, Shortness Of Breath, Swelling   Remdesivir Swelling   Angioedema 8/25      Medication List    STOP taking these medications   dextromethorphan-guaiFENesin 30-600 MG 12hr tablet Commonly known as: MUCINEX DM   ibuprofen 800 MG tablet Commonly known as: ADVIL     TAKE these medications   albuterol 108 (90 Base) MCG/ACT inhaler Commonly known as: VENTOLIN HFA Inhale 2 puffs into the lungs every 6 (six) hours as needed for wheezing or shortness of breath.   amLODipine 10 MG tablet Commonly known as: NORVASC Take 1 tablet (10 mg total) by mouth  daily.   baclofen 10 MG tablet Commonly known as: LIORESAL Take 1 tablet (10 mg total) by mouth 2 (two) times daily.   benzonatate 200 MG capsule Commonly known as: TESSALON Take 1 capsule (200 mg total) by mouth 3 (three) times daily as needed for up to 7 days for cough.   Blood Pressure Monitoring Kit 1 kit by Does not apply route once a week.   ferrous sulfate 325 (65 FE) MG tablet Take 1 tablet (325 mg total) by mouth every other day.   hydrochlorothiazide 25 MG tablet Commonly known as: HYDRODIURIL Take 1 tablet (25 mg total) by mouth daily.   loratadine 10 MG tablet Commonly known as: CLARITIN Take 10 mg by mouth daily as needed for allergies.   ondansetron 4 MG disintegrating tablet Commonly known as: Zofran ODT Take 1 tablet (4 mg total) by mouth every 8 (eight) hours as needed for nausea or vomiting.   pantoprazole 40 MG tablet Commonly known as: PROTONIX Take 1 tablet (40 mg total) by mouth daily.   polyethylene glycol powder 17 GM/SCOOP powder Commonly known as: GLYCOLAX/MIRALAX Take 17 g by mouth daily as needed for moderate constipation.   predniSONE 10 MG tablet Commonly known as: DELTASONE Take 40 mg daily for 1 day, 30 mg daily for 1 day, 20 mg daily for 1 days,10 mg daily for 1 day, then stop   topiramate 25 MG tablet Commonly known as: Topamax Take 2 tablets (50 mg total) by mouth 2 (two) times daily.   Vitamin D (Ergocalciferol) 1.25 MG (50000 UNIT) Caps capsule Commonly known as: DRISDOL Take 1 capsule (50,000 Units total) by mouth every 7 (seven) days.   Wegovy 0.5 MG/0.5ML Soaj Generic drug: Semaglutide-Weight Management Inject 0.5 mg into the skin once a week.       Allergies  Allergen Reactions  . Bee Pollen Hives, Shortness Of Breath and Swelling  . Bee Venom Hives, Shortness Of Breath and Swelling  . Hydrocodone-Acetaminophen Anaphylaxis    No problem when she takes Tylenol  . Peach Flavor Hives, Shortness Of Breath and Other (See  Comments)    SWELLING OF MOUTH  . Pollen Extract Hives, Shortness Of Breath and Swelling  . Remdesivir Swelling    Angioedema 8/25    Other Procedures/Studies: DG Chest 2 View  Result Date: 06/09/2020 CLINICAL DATA:  Recent  bronchitis.  Worsening for 3 days with fever. EXAM: CHEST - 2 VIEW COMPARISON:  May 30, 2020 FINDINGS: No pneumothorax. Suggested subtle opacities in the bases. Cardiomegaly. The hila and mediastinum are normal. No pneumothorax. No other acute abnormalities. IMPRESSION: Suggested subtle bibasilar opacities, right greater than left, are concerning for pneumonia given the history of COVID-19 exposure and cough. Electronically Signed   By: David  Williams III M.D   On: 06/09/2020 17:08   DG Chest 2 View  Result Date: 05/31/2020 CLINICAL DATA:  Left anterior chest pain 3 days.  Cough. EXAM: CHEST - 2 VIEW COMPARISON:  02/16/2020 FINDINGS: Lungs are adequately inflated without focal airspace consolidation or effusion. Cardiomediastinal silhouette and bony structures are within normal. Moderate overlying soft tissues. IMPRESSION: No active cardiopulmonary disease. Electronically Signed   By: Daniel  Boyle M.D.   On: 05/31/2020 14:43   CT ANGIO CHEST PE W OR WO CONTRAST  Result Date: 06/11/2020 CLINICAL DATA:  COVID-19 positivity with shortness of breath and chest pain, initial encounter EXAM: CT ANGIOGRAPHY CHEST WITH CONTRAST TECHNIQUE: Multidetector CT imaging of the chest was performed using the standard protocol during bolus administration of intravenous contrast. Multiplanar CT image reconstructions and MIPs were obtained to evaluate the vascular anatomy. CONTRAST:  75mL OMNIPAQUE IOHEXOL 350 MG/ML SOLN COMPARISON:  Chest x-ray from earlier in the same day. FINDINGS: Cardiovascular: Heart is mildly enlarged in size. The thoracic aorta and its branches are within normal limits. The pulmonary artery is incompletely evaluated due to poor timing of the contrast bolus likely  related to patient's body habitus. No large central pulmonary embolus is noted. No coronary calcifications are noted. No pericardial effusion is noted. Mediastinum/Nodes: Thoracic inlet is within normal limits. No sizable hilar or mediastinal adenopathy is noted. The esophagus as visualized is within normal limits. Lungs/Pleura: Lungs are well aerated bilaterally but demonstrate bilateral ground-glass opacities consistent with the given clinical history of COVID-19 positivity. No sizable effusion is seen. No pneumothorax is noted. Upper Abdomen: Visualized upper abdomen is unremarkable. Musculoskeletal: No acute bony abnormality is noted. Review of the MIP images confirms the above findings. IMPRESSION: Changes consistent with multifocal COVID-19 pneumonia. No definitive pulmonary emboli are seen although the opacification of the pulmonary artery is somewhat limited due to timing of the contrast bolus in the patient's body habitus. Cardiac enlargement. Electronically Signed   By: Mark  Lukens M.D.   On: 06/11/2020 16:03   DG Chest Port 1 View  Result Date: 06/11/2020 CLINICAL DATA:  Shortness of breath. EXAM: PORTABLE CHEST 1 VIEW COMPARISON:  June 09, 2020. FINDINGS: Stable cardiomediastinal silhouette. No pneumothorax pleural effusion is noted. Stable bilateral lung opacities are noted, right greater than left, concerning for multifocal pneumonia. Bony thorax is unremarkable. IMPRESSION: Stable bilateral lung opacities, right greater than left, concerning for multifocal pneumonia. Electronically Signed   By: James  Green Jr M.D.   On: 06/11/2020 12:36     TODAY-DAY OF DISCHARGE:  Subjective:   Maureen Lewis today has no headache,no chest abdominal pain,no new weakness tingling or numbness, feels much better wants to go home today.   Objective:   Blood pressure (!) 150/90, pulse (!) 102, temperature 98.4 F (36.9 C), resp. rate 20, height 6' (1.829 m), weight (!) 200 kg, SpO2 91 %, not currently  breastfeeding.  Intake/Output Summary (Last 24 hours) at 06/15/2020 1040 Last data filed at 06/15/2020 0130 Gross per 24 hour  Intake 720 ml  Output --  Net 720 ml   Filed Weights     06/11/20 1158  Weight: (!) 200 kg    Exam: Awake Alert, Oriented *3, No new F.N deficits, Normal affect [place].AT,PERRAL Supple Neck,No JVD, No cervical lymphadenopathy appriciated.  Symmetrical Chest wall movement, Good air movement bilaterally, CTAB RRR,No Gallops,Rubs or new Murmurs, No Parasternal Heave +ve B.Sounds, Abd Soft, Non tender, No organomegaly appriciated, No rebound -guarding or rigidity. No Cyanosis, Clubbing or edema, No new Rash or bruise   PERTINENT RADIOLOGIC STUDIES: DG Chest 2 View  Result Date: 06/09/2020 CLINICAL DATA:  Recent bronchitis.  Worsening for 3 days with fever. EXAM: CHEST - 2 VIEW COMPARISON:  May 30, 2020 FINDINGS: No pneumothorax. Suggested subtle opacities in the bases. Cardiomegaly. The hila and mediastinum are normal. No pneumothorax. No other acute abnormalities. IMPRESSION: Suggested subtle bibasilar opacities, right greater than left, are concerning for pneumonia given the history of COVID-19 exposure and cough. Electronically Signed   By: David  Williams III M.D   On: 06/09/2020 17:08   DG Chest 2 View  Result Date: 05/31/2020 CLINICAL DATA:  Left anterior chest pain 3 days.  Cough. EXAM: CHEST - 2 VIEW COMPARISON:  02/16/2020 FINDINGS: Lungs are adequately inflated without focal airspace consolidation or effusion. Cardiomediastinal silhouette and bony structures are within normal. Moderate overlying soft tissues. IMPRESSION: No active cardiopulmonary disease. Electronically Signed   By: Daniel  Boyle M.D.   On: 05/31/2020 14:43   CT ANGIO CHEST PE W OR WO CONTRAST  Result Date: 06/11/2020 CLINICAL DATA:  COVID-19 positivity with shortness of breath and chest pain, initial encounter EXAM: CT ANGIOGRAPHY CHEST WITH CONTRAST TECHNIQUE: Multidetector CT imaging of  the chest was performed using the standard protocol during bolus administration of intravenous contrast. Multiplanar CT image reconstructions and MIPs were obtained to evaluate the vascular anatomy. CONTRAST:  75mL OMNIPAQUE IOHEXOL 350 MG/ML SOLN COMPARISON:  Chest x-ray from earlier in the same day. FINDINGS: Cardiovascular: Heart is mildly enlarged in size. The thoracic aorta and its branches are within normal limits. The pulmonary artery is incompletely evaluated due to poor timing of the contrast bolus likely related to patient's body habitus. No large central pulmonary embolus is noted. No coronary calcifications are noted. No pericardial effusion is noted. Mediastinum/Nodes: Thoracic inlet is within normal limits. No sizable hilar or mediastinal adenopathy is noted. The esophagus as visualized is within normal limits. Lungs/Pleura: Lungs are well aerated bilaterally but demonstrate bilateral ground-glass opacities consistent with the given clinical history of COVID-19 positivity. No sizable effusion is seen. No pneumothorax is noted. Upper Abdomen: Visualized upper abdomen is unremarkable. Musculoskeletal: No acute bony abnormality is noted. Review of the MIP images confirms the above findings. IMPRESSION: Changes consistent with multifocal COVID-19 pneumonia. No definitive pulmonary emboli are seen although the opacification of the pulmonary artery is somewhat limited due to timing of the contrast bolus in the patient's body habitus. Cardiac enlargement. Electronically Signed   By: Mark  Lukens M.D.   On: 06/11/2020 16:03   DG Chest Port 1 View  Result Date: 06/11/2020 CLINICAL DATA:  Shortness of breath. EXAM: PORTABLE CHEST 1 VIEW COMPARISON:  June 09, 2020. FINDINGS: Stable cardiomediastinal silhouette. No pneumothorax pleural effusion is noted. Stable bilateral lung opacities are noted, right greater than left, concerning for multifocal pneumonia. Bony thorax is unremarkable. IMPRESSION: Stable  bilateral lung opacities, right greater than left, concerning for multifocal pneumonia. Electronically Signed   By: James  Green Jr M.D.   On: 06/11/2020 12:36     PERTINENT LAB RESULTS: CBC: Recent Labs    06/14/20 0436   06/15/20 0248  WBC 9.5 10.3  HGB 12.5 13.1  HCT 41.2 42.8  PLT 309 355   CMET CMP     Component Value Date/Time   NA 135 06/15/2020 0248   NA 140 04/09/2020 1252   K 4.5 06/15/2020 0248   CL 104 06/15/2020 0248   CO2 17 (L) 06/15/2020 0248   GLUCOSE 229 (H) 06/15/2020 0248   BUN 12 06/15/2020 0248   BUN 11 04/09/2020 1252   CREATININE 0.70 06/15/2020 0248   CREATININE 0.79 05/16/2020 1429   CREATININE 0.60 05/04/2020 1215   CALCIUM 9.1 06/15/2020 0248   PROT 7.8 06/15/2020 0248   PROT 7.9 04/09/2020 1252   ALBUMIN 3.4 (L) 06/15/2020 0248   ALBUMIN 4.8 04/09/2020 1252   AST 27 06/15/2020 0248   AST 16 05/16/2020 1429   ALT 31 06/15/2020 0248   ALT 17 05/16/2020 1429   ALKPHOS 52 06/15/2020 0248   BILITOT 0.5 06/15/2020 0248   BILITOT 0.7 05/16/2020 1429   GFRNONAA >60 06/15/2020 0248   GFRNONAA >60 05/16/2020 1429   GFRAA >60 06/15/2020 0248   GFRAA >60 05/16/2020 1429    GFR Estimated Creatinine Clearance: 217.6 mL/min (by C-G formula based on SCr of 0.7 mg/dL). No results for input(s): LIPASE, AMYLASE in the last 72 hours. No results for input(s): CKTOTAL, CKMB, CKMBINDEX, TROPONINI in the last 72 hours. Invalid input(s): POCBNP Recent Labs    06/14/20 0436 06/15/20 0248  DDIMER <0.27 0.36   No results for input(s): HGBA1C in the last 72 hours. No results for input(s): CHOL, HDL, LDLCALC, TRIG, CHOLHDL, LDLDIRECT in the last 72 hours. No results for input(s): TSH, T4TOTAL, T3FREE, THYROIDAB in the last 72 hours.  Invalid input(s): FREET3 Recent Labs    06/14/20 0436 06/15/20 0248  FERRITIN 99 92   Coags: No results for input(s): INR in the last 72 hours.  Invalid input(s): PT Microbiology: Recent Results (from the past 240  hour(s))  SARS CORONAVIRUS 2 (TAT 6-24 HRS) Nasopharyngeal Nasopharyngeal Swab     Status: Abnormal   Collection Time: 06/09/20  6:53 PM   Specimen: Nasopharyngeal Swab  Result Value Ref Range Status   SARS Coronavirus 2 POSITIVE (A) NEGATIVE Final    Comment: Emailed Melissa Browning @ 0036 on 06/10/2020 by L Lamont (NOTE) SARS-CoV-2 target nucleic acids are DETECTED.  The SARS-CoV-2 RNA is generally detectable in upper and lower respiratory specimens during the acute phase of infection. Positive results are indicative of the presence of SARS-CoV-2 RNA. Clinical correlation with patient history and other diagnostic information is  necessary to determine patient infection status. Positive results do not rule out bacterial infection or co-infection with other viruses.  The expected result is Negative.  Fact Sheet for Patients: https://www.fda.gov/media/138098/download  Fact Sheet for Healthcare Providers: https://www.fda.gov/media/138095/download  This test is not yet approved or cleared by the [place] FDA and  has been authorized for detection and/or diagnosis of SARS-CoV-2 by FDA under an Emergency Use Authorization (EUA). This EUA will remain  in effect (meaning this test can be used) for the duration of the COVI D-19 declaration under Section 564(b)(1) of the Act, 21 U.S.C. section 360bbb-3(b)(1), unless the authorization is terminated or revoked sooner.   Performed at [facility], [address] [place], [place] [address]   Blood Culture (routine x 2)     Status: None (Preliminary result)   Collection Time: 06/11/20 12:20 PM   Specimen: BLOOD LEFT HAND  Result Value Ref Range Status     Specimen Description BLOOD LEFT HAND  Final   Special Requests   Final    BOTTLES DRAWN AEROBIC AND ANAEROBIC Blood Culture results may not be optimal due to an inadequate volume of blood received in culture bottles   Culture   Final    NO GROWTH 4 DAYS Performed at [facility], [address] [place], [place] [address]    Report Status PENDING  Incomplete  Blood Culture (routine x 2)     Status: None (Preliminary result)   Collection Time: 06/11/20 12:35 PM   Specimen: BLOOD  Result Value Ref Range Status   Specimen Description BLOOD RIGHT ANTECUBITAL  Final   Special Requests   Final    BOTTLES DRAWN AEROBIC ONLY Blood Culture results may not be optimal due to an inadequate volume of blood received in culture bottles   Culture   Final    NO GROWTH 4 DAYS Performed at [facility], [address] [place], [place] [address]    Report Status PENDING  Incomplete    FURTHER DISCHARGE INSTRUCTIONS:  Get Medicines reviewed and adjusted: Please take all your medications with you for your next visit with your Primary MD  Laboratory/radiological data: Please request your Primary MD to go over all hospital tests and procedure/radiological results at the follow up, please ask your Primary MD to get all Hospital records sent to his/her office.  In some cases, they will be blood work, cultures and biopsy results pending at the time of your discharge. Please request that your primary care M.D. goes through all the records of your hospital data and follows up on these results.  Also Note the following: If you experience worsening of your admission symptoms, develop shortness of breath, life threatening emergency, suicidal or homicidal thoughts you must seek medical attention immediately by calling 911 or calling your MD immediately  if symptoms less severe.  You must read complete instructions/literature along with all the possible adverse reactions/side effects for all the Medicines you take and that have been prescribed to you. Take any new Medicines after you have completely understood and accpet all the possible adverse reactions/side effects.   Do not drive when taking Pain medications or sleeping medications (Benzodaizepines)  Do not take more  than prescribed Pain, Sleep and Anxiety Medications. It is not advisable to combine anxiety,sleep and pain medications without talking with your primary care practitioner  Special Instructions: If you have smoked or chewed Tobacco  in the last 2 yrs please stop smoking, stop any regular Alcohol  and or any Recreational drug use.  Wear Seat belts while driving.  Please note: You were cared for by a hospitalist during your hospital stay. Once you are discharged, your primary care physician will handle any further medical issues. Please note that NO REFILLS for any discharge medications will be authorized once you are discharged, as it is imperative that you return to your primary care physician (or establish a relationship with a primary care physician if you do not have one) for your post hospital discharge needs so that they can reassess your need for medications and monitor your lab values.  Total Time spent coordinating discharge including counseling, education and face to face time equals 35 minutes.  Signed: Shanker Ghimire 06/15/2020 10:40 AM  

## 2020-06-15 NOTE — Plan of Care (Signed)

## 2020-06-15 NOTE — Progress Notes (Signed)
Patient instructed on AVS. New medication management/schedule.  When to call MD.  Information on setting up follow up visit with primary and lab and xray follow up.  Instructed to quarantine for 3 weeks from 8/21 per AVS.  TOC to bring medications to bedside.  Nurse manager to take patient to [facility]M to speak with MD at bedside for mother and grandmother.

## 2020-06-15 NOTE — Progress Notes (Addendum)
PT Cancellation Note  Patient Details Name: Maureen Lewis MRN: [id number] DOB: 1996/01/12   Cancelled Treatment:    Reason Eval/Treat Not Completed: Other (comment) per chart review, patient is discharging today. Discussed case with RN, who reports patient is doing very well and confirms that she is going home today, states no need for PT to come see due to her definite discharge home today. Thank you for the opportunity to participate in the care of this patient!!    Ann Lions PT, DPT, PN1   Supplemental Physical Therapist [place]    Pager [phone] Acute Rehab Office [phone]

## 2020-06-15 NOTE — Plan of Care (Signed)
  Problem: Education: Goal: Knowledge of General Education information will improve Description: Including pain rating scale, medication(s)/side effects and non-pharmacologic comfort measures 06/15/2020 1148 by Ginnie Smart, RN Outcome: Adequate for Discharge 06/15/2020 1147 by Ginnie Smart, RN Outcome: Progressing   Problem: Health Behavior/Discharge Planning: Goal: Ability to manage health-related needs will improve 06/15/2020 1148 by Ginnie Smart, RN Outcome: Adequate for Discharge 06/15/2020 1147 by Ginnie Smart, RN Outcome: Progressing   Problem: Clinical Measurements: Goal: Ability to maintain clinical measurements within normal limits will improve 06/15/2020 1148 by Ginnie Smart, RN Outcome: Adequate for Discharge 06/15/2020 1147 by Ginnie Smart, RN Outcome: Progressing Goal: Will remain free from infection 06/15/2020 1148 by Ginnie Smart, RN Outcome: Adequate for Discharge 06/15/2020 1147 by Ginnie Smart, RN Outcome: Progressing Goal: Diagnostic test results will improve 06/15/2020 1148 by Ginnie Smart, RN Outcome: Adequate for Discharge 06/15/2020 1147 by Ginnie Smart, RN Outcome: Progressing Goal: Respiratory complications will improve 06/15/2020 1148 by Ginnie Smart, RN Outcome: Adequate for Discharge 06/15/2020 1147 by Ginnie Smart, RN Outcome: Progressing Goal: Cardiovascular complication will be avoided 06/15/2020 1148 by Ginnie Smart, RN Outcome: Adequate for Discharge 06/15/2020 1147 by Ginnie Smart, RN Outcome: Progressing   Problem: Activity: Goal: Risk for activity intolerance will decrease 06/15/2020 1148 by Ginnie Smart, RN Outcome: Adequate for Discharge 06/15/2020 1147 by Ginnie Smart, RN Outcome: Progressing   Problem: Nutrition: Goal: Adequate nutrition will be maintained 06/15/2020 1148 by Ginnie Smart, RN Outcome: Adequate for Discharge 06/15/2020 1147 by Ginnie Smart, RN Outcome: Progressing    Problem: Coping: Goal: Level of anxiety will decrease 06/15/2020 1148 by Ginnie Smart, RN Outcome: Adequate for Discharge 06/15/2020 1147 by Ginnie Smart, RN Outcome: Progressing   Problem: Elimination: Goal: Will not experience complications related to bowel motility 06/15/2020 1148 by Ginnie Smart, RN Outcome: Adequate for Discharge 06/15/2020 1147 by Ginnie Smart, RN Outcome: Progressing Goal: Will not experience complications related to urinary retention 06/15/2020 1148 by Ginnie Smart, RN Outcome: Adequate for Discharge 06/15/2020 1147 by Ginnie Smart, RN Outcome: Progressing   Problem: Pain Managment: Goal: General experience of comfort will improve 06/15/2020 1148 by Ginnie Smart, RN Outcome: Adequate for Discharge 06/15/2020 1147 by Ginnie Smart, RN Outcome: Progressing   Problem: Safety: Goal: Ability to remain free from injury will improve 06/15/2020 1148 by Ginnie Smart, RN Outcome: Adequate for Discharge 06/15/2020 1147 by Ginnie Smart, RN Outcome: Progressing   Problem: Skin Integrity: Goal: Risk for impaired skin integrity will decrease 06/15/2020 1148 by Ginnie Smart, RN Outcome: Adequate for Discharge 06/15/2020 1147 by Ginnie Smart, RN Outcome: Progressing   Problem: Acute Rehab PT Goals(only PT should resolve) Goal: Pt Will Ambulate Outcome: Adequate for Discharge Goal: Pt Will Go Up/Down Stairs Outcome: Adequate for Discharge Goal: PT Additional Goal #1 Outcome: Adequate for Discharge

## 2020-06-16 LAB — CULTURE, BLOOD (ROUTINE X 2)
Culture: NO GROWTH
Culture: NO GROWTH

## 2020-06-18 ENCOUNTER — Ambulatory Visit: Payer: BC Managed Care – PPO | Admitting: Rheumatology

## 2020-06-18 DIAGNOSIS — Z8669 Personal history of other diseases of the nervous system and sense organs: Secondary | ICD-10-CM

## 2020-06-18 DIAGNOSIS — I10 Essential (primary) hypertension: Secondary | ICD-10-CM

## 2020-06-18 DIAGNOSIS — E662 Morbid (severe) obesity with alveolar hypoventilation: Secondary | ICD-10-CM

## 2020-06-18 DIAGNOSIS — A530 Latent syphilis, unspecified as early or late: Secondary | ICD-10-CM

## 2020-06-18 DIAGNOSIS — E559 Vitamin D deficiency, unspecified: Secondary | ICD-10-CM

## 2020-06-18 DIAGNOSIS — R768 Other specified abnormal immunological findings in serum: Secondary | ICD-10-CM

## 2020-06-18 DIAGNOSIS — E282 Polycystic ovarian syndrome: Secondary | ICD-10-CM

## 2020-06-18 DIAGNOSIS — Z8719 Personal history of other diseases of the digestive system: Secondary | ICD-10-CM

## 2020-06-18 DIAGNOSIS — G932 Benign intracranial hypertension: Secondary | ICD-10-CM

## 2020-06-18 DIAGNOSIS — D563 Thalassemia minor: Secondary | ICD-10-CM

## 2020-06-18 DIAGNOSIS — H8113 Benign paroxysmal vertigo, bilateral: Secondary | ICD-10-CM

## 2020-06-18 DIAGNOSIS — M791 Myalgia, unspecified site: Secondary | ICD-10-CM

## 2020-06-18 DIAGNOSIS — R7303 Prediabetes: Secondary | ICD-10-CM

## 2020-06-18 DIAGNOSIS — L680 Hirsutism: Secondary | ICD-10-CM

## 2020-06-19 ENCOUNTER — Encounter: Payer: BC Managed Care – PPO | Admitting: Physical Therapy

## 2020-06-20 ENCOUNTER — Ambulatory Visit (INDEPENDENT_AMBULATORY_CARE_PROVIDER_SITE_OTHER): Payer: BC Managed Care – PPO | Admitting: Family Medicine

## 2020-06-26 ENCOUNTER — Ambulatory Visit (INDEPENDENT_AMBULATORY_CARE_PROVIDER_SITE_OTHER): Payer: BC Managed Care – PPO | Admitting: Adult Health

## 2020-06-26 ENCOUNTER — Other Ambulatory Visit: Payer: Self-pay

## 2020-06-26 ENCOUNTER — Encounter: Payer: Self-pay | Admitting: Adult Health

## 2020-06-26 VITALS — BP 118/80 | HR 134 | Temp 98.2°F | Wt >= 6400 oz

## 2020-06-26 DIAGNOSIS — U071 COVID-19: Secondary | ICD-10-CM

## 2020-06-26 DIAGNOSIS — B3781 Candidal esophagitis: Secondary | ICD-10-CM

## 2020-06-26 DIAGNOSIS — B370 Candidal stomatitis: Secondary | ICD-10-CM

## 2020-06-26 MED ORDER — NYSTATIN 100000 UNIT/ML MT SUSP: 5.0000 mL | Freq: Four times a day (QID) | OROMUCOSAL | 1 refills | Status: DC

## 2020-06-26 NOTE — Progress Notes (Signed)
Subjective:    Patient ID: Maureen Lewis, female    DOB: 04-Jul-1996, 24 y.o.   MRN: [id number]  HPI 24 year old female who  has a past medical history of Abnormal uterine bleeding, Acid reflux, Alpha thalassemia trait, Amenorrhea, Anemia, Back pain, Cesarean delivery delivered (10/11/2019), Chest pain, Complication of anesthesia, Constipation, Depression, Finger mass, left (03/2017), Gestational diabetes, Gestational diabetes (05/23/2019), Gestational hypertension (09/23/2019), Headache, Hypertension, Infection, Irritable bowel syndrome (IBS), Joint pain, Morbid obesity with body mass index (BMI) of 45.0 to 49.9 in adult [facility]), Obesity during pregnancy, antepartum (04/06/2019), Plantar fasciitis, bilateral, Polycystic ovary syndrome, Prediabetes, Pregnancy induced hypertension, Shortness of breath, Sleep apnea, Supervision of normal first pregnancy (04/06/2019), and Vertigo (2017).  She presents to the office today for TCM visit   Admit Date: 06/11/2020 Discharge Date: 06/15/2020  She presented to the emergency room with shortness of breath and was found to have pneumonia with hypoxic respiratory failure due to COVID-19.  She tested positive for COVID-19 on 06/09/2020. She was unvaccinated   Her chest x-ray showed bilateral lung opacities with the right being greater than the left.  CTA of the chest showed no PE but multifocal pneumonia.  He was treated with steroids on 8/23 as well as remdesivir from 8/23-8/25.  Unfortunately she had angioedema after remdesivir then this was discontinued.  She reports today that she is feeling much better since she was discharged but continues to have shortness of breath with exertion well as elevated pulse readings with exertion.  Today in the office her pulse was 136 after walking down the hall and with rest dropped to mid 90s.  She is doing deep breathing exercises at home.  Unfortunately her mother and grandmother continue to be hospitalized due to  COVID-19.  Her mom was taken off of ventilator today and is on dialysis.  She is monitoring her pulse and oxygen saturations at home.  She does report elevated pulse with exertion up to the 130s and oxygen saturation is constantly in the 96 to 98% range.  She reports that she has a coating on her tongue and it is painful to swallow.   Review of Systems  Constitutional: Positive for activity change.  HENT: Positive for sore throat.   Respiratory: Positive for shortness of breath.   Cardiovascular: Positive for palpitations.  Gastrointestinal: Negative.   Genitourinary: Negative.   Musculoskeletal: Negative.   Skin: Negative.   Neurological: Negative.   Psychiatric/Behavioral: Negative.   All other systems reviewed and are negative.    Past Medical History:  Diagnosis Date   Abnormal uterine bleeding    Acid reflux    Alpha thalassemia trait    Amenorrhea    Anemia    no current med.   Back pain    Cesarean delivery delivered 10/11/2019   10/09/2019 - primary CS for failed IOL   Chest pain    Complication of anesthesia    states had to keep giving her anesthesia during EGD   Constipation    Depression    "I'm good"   Finger mass, left 03/2017   middle finger   Gestational diabetes    Gestational diabetes 05/23/2019   Current Diabetic Medications:  None  _0  Aspirin 81 mg daily after 12 weeks (? A2/B GDM)  Required Referrals for A1GDM or A2GDM: _1  Diabetes Education and Testing Supplies _2  Nutrition Cousult  For A2/B GDM or higher classes of DM _3  Diabetes Education and Testing Supplies _4   Nutrition Counsult _0  Fetal ECHO after 20 weeks  _1  Eye exam for retina evaluation   Baseline and surveillance labs (   Gestational hypertension 09/23/2019   Guidelines for Antenatal Testing and Sonography  (with updated ICD-10 codes)  Updated  20-Sep-2019 with Dr. Tama High  INDICATION U/S 2 X week NST/AFI  or full BPP wkly DELIVERY Diabetes   A1 - good control -  O24.410    A2 - good control - O24.419      A2  - poor control or poor compliance - O24.419, E11.65   (Macrosomia or polyhydramnios) **E11.65 is extra code for poor control**    A2/B - O24.   Headache    Hypertension    Infection    UTI   Irritable bowel syndrome (IBS)    Joint pain    Morbid obesity with body mass index (BMI) of 45.0 to 49.9 in adult [facility])    Obesity during pregnancy, antepartum 04/06/2019   Body mass index is 53.71 kg/m.  Recommendations _2  Aspirin 81 mg daily after 12 weeks; discontinue after 36 weeks _3  Nutrition consult _4  Weight gain 11-20 lbs for singleton and 25-35 lbs for twin pregnancy (IOM guidelines) Higher class of obesity patients recommended to gain closer to lower limit  Weight loss is associated with adverse outcomes _5  Baseline and surveillance labs (pulled in fr   Plantar fasciitis, bilateral    Polycystic ovary syndrome    Prediabetes    "prediabetes"   Pregnancy induced hypertension    Shortness of breath    Sleep apnea    no CPAP use   Supervision of normal first pregnancy 04/06/2019   [name]: [ x]Initial [ x]12 _6 20 _7 28 _8 32 _9 36 _10 38 _11 39 _12 40 Nursing Staff Provider Office Location [facility]  Dating   LMP Language  English  Anatomy US  Nml Flu Vaccine  Declined 07/28/19 Genetic Screen  NIPS: low risks   AFP:   negative  TDaP vaccine   08/25/19 Hgb A1C or  GTT Early A1C 5.8 Third trimester: GDM insulin Rhogam  NA   LAB RESULTS  Feeding Plan Breast Blood Type   Vertigo 2017    Social History   Socioeconomic History   Marital status: Single    Spouse name: Not on file   Number of children: Not on file   Years of education: Not on file   Highest education level: Not on file  Occupational History   Occupation: Press photographer associate    Employer: [id number]  Tobacco Use   Smoking status: Never Smoker   Smokeless tobacco: Never Used  Scientific laboratory technician Use: Never used  Substance and Sexual Activity    Alcohol use: Not Currently    Comment: social    Drug use: No   Sexual activity: Not Currently    Birth control/protection: None, Injection  Other Topics Concern   Not on file  Social History Narrative   Right handed    Soda sometimes   Lives with mom and cousin, a grandmother in [place] also helps care for her.       She is in nursing school.    Social Determinants of Health   Financial Resource Strain:    Difficulty of Paying Living Expenses: Not on file  Food Insecurity:    Worried About Charity fundraiser in the Last Year: Not on file   [employer] of Food in the Last Year:  Not on file  Transportation Needs:    Lack of Transportation (Medical): Not on file   Lack of Transportation (Non-Medical): Not on file  Physical Activity:    Days of Exercise per Week: Not on file   Minutes of Exercise per Session: Not on file  Stress:    Feeling of Stress : Not on file  Social Connections:    Frequency of Communication with Friends and Family: Not on file   Frequency of Social Gatherings with Friends and Family: Not on file   Attends Religious Services: Not on file   Active Member of Clubs or Organizations: Not on file   Attends Archivist Meetings: Not on file   Marital Status: Not on file  Intimate Partner Violence:    Fear of Current or Ex-Partner: Not on file   Emotionally Abused: Not on file   Physically Abused: Not on file   Sexually Abused: Not on file    Past Surgical History:  Procedure Laterality Date   CESAREAN SECTION N/A 10/09/2019   Procedure: CESAREAN SECTION;  Surgeon: Aletha Halim, MD;  Location: [facility];  Service: Obstetrics;  Laterality: N/A;   COLONOSCOPY WITH PROPOFOL  10/07/2016   ESOPHAGOGASTRODUODENOSCOPY  12/21/2015   EXCISION MASS UPPER EXTREMETIES Left 04/21/2017   Procedure: EXCISION MASS LEFT MIDDLE FINGER;  Surgeon: Daryll Brod, MD;  Location: [facility];  Service: Orthopedics;  Laterality:  Left;   IR FL GUIDED LOC OF NEEDLE/CATH TIP FOR SPINAL INJECTION RT  03/13/2020    Family History  Problem Relation Age of Onset   Diabetes Maternal Aunt    Hypertension Maternal Uncle    Depression Maternal Grandfather    Diabetes Maternal Grandfather    Hypertension Maternal Grandfather    Arthritis Mother    High blood pressure Mother    Depression Mother    Sleep apnea Mother    Obesity Mother    Diabetes Mother    Hypertension Mother    Sleep apnea Father    Obesity Father     Allergies  Allergen Reactions   Bee Pollen Hives, Shortness Of Breath and Swelling   Bee Venom Hives, Shortness Of Breath and Swelling   Hydrocodone-Acetaminophen Anaphylaxis    No problem when she takes Tylenol   Peach Flavor Hives, Shortness Of Breath and Other (See Comments)    SWELLING OF MOUTH   Pollen Extract Hives, Shortness Of Breath and Swelling   Remdesivir Swelling    Angioedema 8/25    Current Outpatient Medications on File Prior to Visit  Medication Sig Dispense Refill   albuterol (VENTOLIN HFA) 108 (90 Base) MCG/ACT inhaler Inhale 2 puffs into the lungs every 6 (six) hours as needed for wheezing or shortness of breath. 8 g 0   amLODipine (NORVASC) 10 MG tablet Take 1 tablet (10 mg total) by mouth daily. 30 tablet 2   baclofen (LIORESAL) 10 MG tablet Take 1 tablet (10 mg total) by mouth 2 (two) times daily. 30 each 0   Blood Pressure Monitoring KIT 1 kit by Does not apply route once a week. 1 kit 0   ferrous sulfate 325 (65 FE) MG tablet Take 1 tablet (325 mg total) by mouth every other day. 45 tablet 1   hydrochlorothiazide (HYDRODIURIL) 25 MG tablet Take 1 tablet (25 mg total) by mouth daily. 30 tablet 2   loratadine (CLARITIN) 10 MG tablet Take 10 mg by mouth daily as needed for allergies.      ondansetron [facility]  ODT) 4 MG disintegrating tablet Take 1 tablet (4 mg total) by mouth every 8 (eight) hours as needed for nausea or vomiting. 20 tablet 0    pantoprazole (PROTONIX) 40 MG tablet Take 1 tablet (40 mg total) by mouth daily. 30 tablet 3   polyethylene glycol powder (GLYCOLAX/MIRALAX) 17 GM/SCOOP powder Take 17 g by mouth daily as needed for moderate constipation.      Semaglutide-Weight Management (WEGOVY) 0.5 MG/0.5ML SOAJ Inject 0.5 mg into the skin once a week. 2 mL 0   topiramate (TOPAMAX) 25 MG tablet Take 2 tablets (50 mg total) by mouth 2 (two) times daily. 180 tablet 1   Vitamin D, Ergocalciferol, (DRISDOL) 1.25 MG (50000 UNIT) CAPS capsule Take 1 capsule (50,000 Units total) by mouth every 7 (seven) days. 4 capsule 0   No current facility-administered medications on file prior to visit.    BP 118/80 (BP Location: Left Wrist, Patient Position: Sitting, Cuff Size: Large)    Pulse (!) 134    Temp 98.2 F (36.8 C) (Oral)    Wt (!) 435 lb (197.3 kg)    SpO2 96%    BMI 59.00 kg/m       Objective:   Physical Exam Vitals and nursing note reviewed.  Constitutional:      Appearance: Normal appearance. She is obese.  HENT:     Mouth/Throat:     Pharynx: Posterior oropharyngeal erythema present.     Comments: Thrush noted on tongue and esophagus Cardiovascular:     Rate and Rhythm: Regular rhythm. Tachycardia present.     Pulses: Normal pulses.     Heart sounds: Normal heart sounds.  Pulmonary:     Effort: Pulmonary effort is normal.     Breath sounds: Normal breath sounds.  Abdominal:     General: Abdomen is flat.     Palpations: Abdomen is soft.  Skin:    General: Skin is warm and dry.     Capillary Refill: Capillary refill takes less than 2 seconds.  Neurological:     General: No focal deficit present.     Mental Status: She is alert and oriented to person, place, and time.  Psychiatric:        Mood and Affect: Mood normal.        Behavior: Behavior normal.        Thought Content: Thought content normal.        Judgment: Judgment normal.       Assessment & Plan:  1. COVID-19 virus infection -Continue  with deep breathing exercises at home.  Monitor pulse ox and pulse as well, pulse should start to come down as she recovers.  I do not think we need to put her on a beta-blocker such as propanolol at this time.  We will repeat chest x-ray in a couple weeks.  Recheck CBC and CMP now.  She can continue to use her inhaler when she becomes short of breath - CBC with Differential/Platelet; Future - CMP with eGFR([facility]); Future - DG Chest 2 View; Future - CMP with eGFR([facility]) - CBC with Differential/Platelet  2. Thrush of mouth and esophagus (HCC)  - nystatin (MYCOSTATIN) 100000 UNIT/ML suspension; Take 5 mLs (500,000 Units total) by mouth 4 (four) times daily.  Dispense: 60 mL; Refill: 1  Dorothyann Peng, NP

## 2020-06-26 NOTE — Patient Instructions (Signed)
Please get your covid vaccination   Continue to monitor your pulse and oxygen levels at home   I will going to do some blood work today   Repeat chest xray the week of September 24 at the [place] office   I have sent in some nystatin for your thrush

## 2020-06-27 LAB — COMPLETE METABOLIC PANEL WITH GFR
AG Ratio: 1.4 (calc) (ref 1.0–2.5)
ALT: 19 U/L (ref 6–29)
AST: 15 U/L (ref 10–30)
Albumin: 4.1 g/dL (ref 3.6–5.1)
Alkaline phosphatase (APISO): 74 U/L (ref 31–125)
BUN: 12 mg/dL (ref 7–25)
CO2: 23 mmol/L (ref 20–32)
Calcium: 10 mg/dL (ref 8.6–10.2)
Chloride: 109 mmol/L (ref 98–110)
Creat: 0.71 mg/dL (ref 0.50–1.10)
GFR, Est African American: 141 mL/min/{1.73_m2} (ref >=60)
GFR, Est Non African American: 122 mL/min/{1.73_m2} (ref >=60)
Globulin: 2.9 g/dL (calc) (ref 1.9–3.7)
Glucose, Bld: 102 mg/dL — ABNORMAL HIGH (ref 65–99)
Potassium: 4.1 mmol/L (ref 3.5–5.3)
Sodium: 141 mmol/L (ref 135–146)
Total Bilirubin: 0.9 mg/dL (ref 0.2–1.2)
Total Protein: 7 g/dL (ref 6.1–8.1)

## 2020-06-27 LAB — CBC WITH DIFFERENTIAL/PLATELET
Absolute Monocytes: 515 cells/uL (ref 200–950)
Basophils Absolute: 39 cells/uL (ref 0–200)
Basophils Relative: 0.5 %
Eosinophils Absolute: 23 cells/uL (ref 15–500)
Eosinophils Relative: 0.3 %
HCT: 39.4 % (ref 35.0–45.0)
Hemoglobin: 12.6 g/dL (ref 11.7–15.5)
Lymphs Abs: 2278 cells/uL (ref 850–3900)
MCH: 22.5 pg — ABNORMAL LOW (ref 27.0–33.0)
MCHC: 32 g/dL (ref 32.0–36.0)
MCV: 70.4 fL — ABNORMAL LOW (ref 80.0–100.0)
MPV: 10.7 fL (ref 7.5–12.5)
Monocytes Relative: 6.6 %
Neutro Abs: 4945 cells/uL (ref 1500–7800)
Neutrophils Relative %: 63.4 %
Platelets: 296 10*3/uL (ref 140–400)
RBC: 5.6 10*6/uL — ABNORMAL HIGH (ref 3.80–5.10)
RDW: 17.9 % — ABNORMAL HIGH (ref 11.0–15.0)
Total Lymphocyte: 29.2 %
WBC: 7.8 10*3/uL (ref 3.8–10.8)

## 2020-06-28 ENCOUNTER — Telehealth: Payer: Self-pay | Admitting: Neurology

## 2020-06-28 ENCOUNTER — Telehealth (INDEPENDENT_AMBULATORY_CARE_PROVIDER_SITE_OTHER): Payer: BC Managed Care – PPO | Admitting: Adult Health

## 2020-06-28 ENCOUNTER — Ambulatory Visit: Payer: BC Managed Care – PPO | Admitting: Adult Health

## 2020-06-28 DIAGNOSIS — Z9989 Dependence on other enabling machines and devices: Secondary | ICD-10-CM

## 2020-06-28 DIAGNOSIS — G932 Benign intracranial hypertension: Secondary | ICD-10-CM

## 2020-06-28 DIAGNOSIS — G4733 Obstructive sleep apnea (adult) (pediatric): Secondary | ICD-10-CM | POA: Diagnosis not present

## 2020-06-28 NOTE — Progress Notes (Signed)
PATIENT: Maureen Lewis DOB: [birth year]  REASON FOR VISIT: follow up HISTORY FROM: patient  Virtual Visit via Video Note  I connected with Maureen Lewis on 06/28/20 at 11:30 AM EDT by a video enabled telemedicine application located remotely at [facility] and verified that I am speaking with the correct person using two identifiers who was located at their own home.   I discussed the limitations of evaluation and management by telemedicine and the availability of in person appointments. The patient expressed understanding and agreed to proceed.   PATIENT: Maureen Lewis DOB: [birth year]  REASON FOR VISIT: follow up HISTORY FROM: patient  HISTORY OF PRESENT ILLNESS: Today 06/28/20:  Maureen Lewis is a 24 year old female with a history of obstructive sleep apnea and pseudotumor cerebri.  She reports that she never received a call from [facility] to set her up on a new machine.  Therefore she has not been using CPAP therapy.  Is not clear that she is getting a new machine or if the settings on all machine was can be changed to AutoSet.  The patient does state that she typically wakes up with a headache.  This is most likely due to untreated sleep apnea.  She denies any changes with her vision.  She continues on Topamax for her headaches.  HISTORY 02-23-2020; She had been seen here as a referral from [place] [facility] for evaluation of dizziness and headaches 2017 and was followed in 2018 lst . In the meantime she had a baby girl, born 10-09-2019. She has dropped out of college for now.  Maureen Lewis presents now for a worsening of headaches, of the same quality but of higher intensity than she had 3 years ago.  She had undergone a sleep study with [name] in October 2018 evaluating her risk factors of obesity, retro-orbital pressure headaches, dizzy spells, vertigo in correlation to her pseudotumor condition.  She tested positive for mild  sleep apnea with an AHI of 8.4, the REM sleep was much more pronounced with an AHI of 39.6.  And supine AHI was 9.9 versus a nonsupine sleep position AHI of 6/h.  There was no clinically significant hypoxemia or hypercapnia found and we recommended a CPAP titration at the time she returned for the CPAP titration in November 2018 a CPAP at 9 cm water pressure was prescribed heated humidity and 2 cm EPR and she used a nasal pillow.  In 2019 she had again passing out spells headaches and high blood pressure but she was noncompliant with CPAP.  We had made in January 12, 2018 appointment but this did not take place.  She does not have her CPAP anymore(?).  She has not been on medication  And NP Nafziger just started her back on Topiramate after a 2 year hiatus she had been non compliant with medical advise.  HTN persisted, vertigo returned. Counterclockwise vertigo which could be elicited with rapid head movements at repeated head movements.  The pregnancy was hard on her- in the pandemic-and birth did increase the headaches in intensity and frequency. She has developed gestational diabetes and weight gain. c section delivery.    REVIEW OF SYSTEMS: Out of a complete 14 system review of symptoms, the patient complains only of the following symptoms, and all other reviewed systems are negative.  See HPI  ALLERGIES: Allergies  Allergen Reactions  . Bee Pollen Hives, Shortness Of Breath and Swelling  . Bee Venom Hives, Shortness  Of Breath and Swelling  . Hydrocodone-Acetaminophen Anaphylaxis    No problem when she takes Tylenol  . Peach Flavor Hives, Shortness Of Breath and Other (See Comments)    SWELLING OF MOUTH  . Pollen Extract Hives, Shortness Of Breath and Swelling  . Remdesivir Swelling    Angioedema 8/25    HOME MEDICATIONS: Outpatient Medications Prior to Visit  Medication Sig Dispense Refill  . albuterol (VENTOLIN HFA) 108 (90 Base) MCG/ACT inhaler Inhale 2 puffs into the lungs every 6  (six) hours as needed for wheezing or shortness of breath. 8 g 0  . amLODipine (NORVASC) 10 MG tablet Take 1 tablet (10 mg total) by mouth daily. 30 tablet 2  . baclofen (LIORESAL) 10 MG tablet Take 1 tablet (10 mg total) by mouth 2 (two) times daily. 30 each 0  . Blood Pressure Monitoring KIT 1 kit by Does not apply route once a week. 1 kit 0  . ferrous sulfate 325 (65 FE) MG tablet Take 1 tablet (325 mg total) by mouth every other day. 45 tablet 1  . hydrochlorothiazide (HYDRODIURIL) 25 MG tablet Take 1 tablet (25 mg total) by mouth daily. 30 tablet 2  . loratadine (CLARITIN) 10 MG tablet Take 10 mg by mouth daily as needed for allergies.     Marland Kitchen nystatin (MYCOSTATIN) 100000 UNIT/ML suspension Take 5 mLs (500,000 Units total) by mouth 4 (four) times daily. 60 mL 1  . ondansetron (ZOFRAN ODT) 4 MG disintegrating tablet Take 1 tablet (4 mg total) by mouth every 8 (eight) hours as needed for nausea or vomiting. 20 tablet 0  . pantoprazole (PROTONIX) 40 MG tablet Take 1 tablet (40 mg total) by mouth daily. 30 tablet 3  . polyethylene glycol powder (GLYCOLAX/MIRALAX) 17 GM/SCOOP powder Take 17 g by mouth daily as needed for moderate constipation.     . Semaglutide-Weight Management (WEGOVY) 0.5 MG/0.5ML SOAJ Inject 0.5 mg into the skin once a week. 2 mL 0  . topiramate (TOPAMAX) 25 MG tablet Take 2 tablets (50 mg total) by mouth 2 (two) times daily. 180 tablet 1  . Vitamin D, Ergocalciferol, (DRISDOL) 1.25 MG (50000 UNIT) CAPS capsule Take 1 capsule (50,000 Units total) by mouth every 7 (seven) days. 4 capsule 0   No facility-administered medications prior to visit.    PAST MEDICAL HISTORY: Past Medical History:  Diagnosis Date  . Abnormal uterine bleeding   . Acid reflux   . Alpha thalassemia trait   . Amenorrhea   . Anemia    no current med.  . Back pain   . Cesarean delivery delivered 10/11/2019   10/09/2019 - primary CS for failed IOL  . Chest pain   . Complication of anesthesia     states had to keep giving her anesthesia during EGD  . Constipation   . Depression    "I'm good"  . Finger mass, left 03/2017   middle finger  . Gestational diabetes   . Gestational diabetes 05/23/2019   Current Diabetic Medications:  None  [ ] Aspirin 81 mg daily after 12 weeks (? A2/B GDM)  Required Referrals for A1GDM or A2GDM: [ ] Diabetes Education and Testing Supplies [ ] Nutrition Cousult  For A2/B GDM or higher classes of DM [ ] Diabetes Education and Equities trader [ ] Nutrition Counsult [ ] Fetal ECHO after 20 weeks  [ ] Eye exam for retina evaluation   Baseline and surveillance labs (  . Gestational hypertension 09/23/2019   Guidelines for  Antenatal Testing and Sonography  (with updated ICD-10 codes)  Updated  2019-09-12 with Dr. Tama High  INDICATION U/S 2 X week NST/AFI  or full BPP wkly DELIVERY Diabetes   A1 - good control - O24.410    A2 - good control - O24.419      A2  - poor control or poor compliance - O24.419, E11.65   (Macrosomia or polyhydramnios) **E11.65 is extra code for poor control**    A2/B - O24.  Marland Kitchen Headache   . Hypertension   . Infection    UTI  . Irritable bowel syndrome (IBS)   . Joint pain   . Morbid obesity with body mass index (BMI) of 45.0 to 49.9 in adult [facility])   . Obesity during pregnancy, antepartum 04/06/2019   Body mass index is 53.71 kg/m.  Recommendations [x] Aspirin 81 mg daily after 12 weeks; discontinue after 36 weeks [ ] Nutrition consult [ ] Weight gain 11-20 lbs for singleton and 25-35 lbs for twin pregnancy (IOM guidelines) Higher class of obesity patients recommended to gain closer to lower limit  Weight loss is associated with adverse outcomes [ ] Baseline and surveillance labs (pulled in fr  . Plantar fasciitis, bilateral   . Polycystic ovary syndrome   . Prediabetes    "prediabetes"  . Pregnancy induced hypertension   . Shortness of breath   . Sleep apnea    no CPAP use  . Supervision of normal first pregnancy 04/06/2019    [name]: [ x]Initial [ x]12 [ ]20 [ ]28 [ ]32 [ ]36 [ ]38 [ ]39 [ ]40 Nursing Staff Provider Office Location [facility]  Dating   LMP Language  English  Anatomy US  Nml Flu Vaccine  Declined 07/28/19 Genetic Screen  NIPS: low risks   AFP:   negative  TDaP vaccine   08/25/19 Hgb A1C or  GTT Early A1C 5.8 Third trimester: GDM insulin Rhogam  NA   LAB RESULTS  Feeding Plan Breast Blood Type  . Vertigo 2017    PAST SURGICAL HISTORY: Past Surgical History:  Procedure Laterality Date  . CESAREAN SECTION N/A 10/09/2019   Procedure: CESAREAN SECTION;  Surgeon: Aletha Halim, MD;  Location: [facility];  Service: Obstetrics;  Laterality: N/A;  . COLONOSCOPY WITH PROPOFOL  10/07/2016  . ESOPHAGOGASTRODUODENOSCOPY  12/21/2015  . EXCISION MASS UPPER EXTREMETIES Left 04/21/2017   Procedure: EXCISION MASS LEFT MIDDLE FINGER;  Surgeon: Daryll Brod, MD;  Location: [employer];  Service: Orthopedics;  Laterality: Left;  . IR FL GUIDED LOC OF NEEDLE/CATH TIP FOR SPINAL INJECTION RT  03/13/2020    FAMILY HISTORY: Family History  Problem Relation Age of Onset  . Diabetes Maternal Aunt   . Hypertension Maternal Uncle   . Depression Maternal Grandfather   . Diabetes Maternal Grandfather   . Hypertension Maternal Grandfather   . Arthritis Mother   . High blood pressure Mother   . Depression Mother   . Sleep apnea Mother   . Obesity Mother   . Diabetes Mother   . Hypertension Mother   . Sleep apnea Father   . Obesity Father     SOCIAL HISTORY: Social History   Socioeconomic History  . Marital status: Single    Spouse name: Not on file  . Number of children: Not on file  . Years of education: Not on file  . Highest education level: Not on file  Occupational History  . Occupation: Chemical engineer: [employer]  Tobacco Use  . Smoking status: Never Smoker  . Smokeless tobacco: Never Used  Vaping Use  . Vaping Use: Never used  Substance and Sexual Activity  .  Alcohol use: Not Currently    Comment: social   . Drug use: No  . Sexual activity: Not Currently    Birth control/protection: None, Injection  Other Topics Concern  . Not on file  Social History Narrative   Right handed    Soda sometimes   Lives with mom and cousin, a grandmother in [place] also helps care for her.       She is in nursing school.    Social Determinants of Health   Financial Resource Strain:   . Difficulty of Paying Living Expenses: Not on file  Food Insecurity:   . Worried About Charity fundraiser in the Last Year: Not on file  . Ran Out of Food in the Last Year: Not on file  Transportation Needs:   . Lack of Transportation (Medical): Not on file  . Lack of Transportation (Non-Medical): Not on file  Physical Activity:   . Days of Exercise per Week: Not on file  . Minutes of Exercise per Session: Not on file  Stress:   . Feeling of Stress : Not on file  Social Connections:   . Frequency of Communication with Friends and Family: Not on file  . Frequency of Social Gatherings with Friends and Family: Not on file  . Attends Religious Services: Not on file  . Active Member of Clubs or Organizations: Not on file  . Attends Archivist Meetings: Not on file  . Marital Status: Not on file  Intimate Partner Violence:   . Fear of Current or Ex-Partner: Not on file  . Emotionally Abused: Not on file  . Physically Abused: Not on file  . Sexually Abused: Not on file      PHYSICAL EXAM Generalized: Well developed, in no acute distress   Neurological examination  Mentation: Alert oriented to time, place, history taking. Follows all commands speech and language fluent Cranial nerve II-XII:Extraocular movements were full. Facial symmetry noted. uvula tongue midline. Head turning and shoulder shrug  were normal and symmetric. Motor: Good strength throughout subjectively per patient Sensory: Sensory testing is intact to soft touch on all 4 extremities  subjectively per patient Coordination: Cerebellar testing reveals good finger-nose-finger  Gait and station: Patient is able to stand from a seated position. gait is normal.  Reflexes: UTA  DIAGNOSTIC DATA (LABS, IMAGING, TESTING) - I reviewed patient records, labs, notes, testing and imaging myself where available.  Lab Results  Component Value Date   WBC 7.8 06/26/2020   HGB 12.6 06/26/2020   HCT 39.4 06/26/2020   MCV 70.4 (L) 06/26/2020   PLT 296 06/26/2020      Component Value Date/Time   NA 141 06/26/2020 1457   NA 140 04/09/2020 1252   K 4.1 06/26/2020 1457   CL 109 06/26/2020 1457   CO2 23 06/26/2020 1457   GLUCOSE 102 (H) 06/26/2020 1457   BUN 12 06/26/2020 1457   BUN 11 04/09/2020 1252   CREATININE 0.71 06/26/2020 1457   CALCIUM 10.0 06/26/2020 1457   PROT 7.0 06/26/2020 1457   PROT 7.9 04/09/2020 1252   ALBUMIN 3.4 (L) 06/15/2020 0248   ALBUMIN 4.8 04/09/2020 1252   AST 15 06/26/2020 1457   AST 16 05/16/2020 1429   ALT 19 06/26/2020 1457   ALT 17 05/16/2020 1429   ALKPHOS 52  06/15/2020 0248   BILITOT 0.9 06/26/2020 1457   BILITOT 0.7 05/16/2020 1429   GFRNONAA 122 06/26/2020 1457   GFRAA 141 06/26/2020 1457   Lab Results  Component Value Date   CHOL 156 04/09/2020   HDL 42 04/09/2020   LDLCALC 101 (H) 04/09/2020   TRIG 53 06/11/2020   CHOLHDL 3 04/27/2018   Lab Results  Component Value Date   HGBA1C 6.0 (H) 04/09/2020   Lab Results  Component Value Date   VITAMINB12 243 04/09/2020   Lab Results  Component Value Date   TSH 1.270 04/09/2020      ASSESSMENT AND PLAN 24 y.o. year old female  has a past medical history of Abnormal uterine bleeding, Acid reflux, Alpha thalassemia trait, Amenorrhea, Anemia, Back pain, Cesarean delivery delivered (10/11/2019), Chest pain, Complication of anesthesia, Constipation, Depression, Finger mass, left (03/2017), Gestational diabetes, Gestational diabetes (05/23/2019), Gestational hypertension (09/23/2019),  Headache, Hypertension, Infection, Irritable bowel syndrome (IBS), Joint pain, Morbid obesity with body mass index (BMI) of 45.0 to 49.9 in adult [facility]), Obesity during pregnancy, antepartum (04/06/2019), Plantar fasciitis, bilateral, Polycystic ovary syndrome, Prediabetes, Pregnancy induced hypertension, Shortness of breath, Sleep apnea, Supervision of normal first pregnancy (04/06/2019), and Vertigo (2017). here with:  OSA on CPAP Pseudotumor cerebri  . Currently not using CPAP was under the impression that she was getting a new machine?  We will reach out to her DME company as her old machine may be able to be changed to AutoSet . Continue Topamax . Follow-up in 3 months or sooner if needed  I spent 20 minutes of face-to-face and non-face-to-face time with patient.  This included previsit chart review, lab review, study review, order entry, electronic health record documentation, patient education.  Ward Givens, MSN, NP-C 06/28/2020, 3:25 PM [facility] [address], [place], [place] [address] [phone]

## 2020-06-28 NOTE — Telephone Encounter (Signed)
Contacted the patient in regards to her CPAP set up. Back in June Dr Dohmeier ordered auto CPAP. Patient was to get set up with new auto CPAP.. I was unaware that she was set up in 2019 and kept the machine. I thought it had been turned back in. Patient states that machine was lost in a move.  Patient advises that she is under new insurance now so hope is that she will be able to get a new machine under the new insurance otherwise it would be an out of pocket expense because it has been < 5 yrs. I gave the patient [employer] number and advised I would contact them, but I encouraged her to also reach out and discuss with them about whether she can get set up. Advised if she does get set up, she would need to call us when she is scheduled for 31-90 day follow up. Pt verbalized understanding.

## 2020-06-28 NOTE — Telephone Encounter (Signed)
..   Pt understands that although there may be some limitations with this type of visit, we will take all precautions to reduce any security or privacy concerns.  Pt understands that this will be treated like an in office visit and we will file with pt's insurance, and there may be a patient responsible charge related to this service. ? ?

## 2020-07-10 ENCOUNTER — Encounter (HOSPITAL_COMMUNITY): Payer: Self-pay

## 2020-07-10 ENCOUNTER — Ambulatory Visit (HOSPITAL_COMMUNITY)
Admission: EM | Admit: 2020-07-10 | Discharge: 2020-07-10 | Disposition: A | Discharge status: Home / Self Care | Payer: BC Managed Care – PPO | Attending: Family Medicine | Admitting: Family Medicine

## 2020-07-10 ENCOUNTER — Other Ambulatory Visit: Payer: Self-pay

## 2020-07-10 ENCOUNTER — Ambulatory Visit: Payer: BC Managed Care – PPO | Admitting: Rheumatology

## 2020-07-10 ENCOUNTER — Ambulatory Visit (INDEPENDENT_AMBULATORY_CARE_PROVIDER_SITE_OTHER): Payer: BC Managed Care – PPO

## 2020-07-10 DIAGNOSIS — M25571 Pain in right ankle and joints of right foot: Secondary | ICD-10-CM

## 2020-07-10 DIAGNOSIS — S93491A Sprain of other ligament of right ankle, initial encounter: Secondary | ICD-10-CM | POA: Diagnosis not present

## 2020-07-10 DIAGNOSIS — M7989 Other specified soft tissue disorders: Secondary | ICD-10-CM | POA: Diagnosis not present

## 2020-07-10 MED ORDER — IBUPROFEN 600 MG PO TABS: 600.0000 mg | ORAL_TABLET | Freq: Three times a day (TID) | ORAL | 0 refills | Status: DC | PRN

## 2020-07-10 NOTE — ED Provider Notes (Signed)
[place]    CSN: [id number] Arrival date & time: 07/10/20  1749      History   Chief Complaint Chief Complaint  Patient presents with  . Ankle Injury    HPI Maureen Lewis is a 24 y.o. female.   She is presenting with right ankle pain and leg pain following a fall.  She fell while walking on the stairs and had a plantarflexed injury to the ankle.  She has pain with bearing weight.  No history of similar pain.  No history of surgery.  HPI  Past Medical History:  Diagnosis Date  . Abnormal uterine bleeding   . Acid reflux   . Alpha thalassemia trait   . Amenorrhea   . Anemia    no current med.  . Back pain   . Cesarean delivery delivered 10/11/2019   10/09/2019 - primary CS for failed IOL  . Chest pain   . Complication of anesthesia    states had to keep giving her anesthesia during EGD  . Constipation   . Depression    "I'm good"  . Finger mass, left 03/2017   middle finger  . Gestational diabetes   . Gestational diabetes 05/23/2019   Current Diabetic Medications:  None  [ ]  Aspirin 81 mg daily after 12 weeks (? A2/B GDM)  Required Referrals for A1GDM or A2GDM: [ ]  Diabetes Education and Testing Supplies [ ]  Nutrition Cousult  For A2/B GDM or higher classes of DM [ ]  Diabetes Education and Testing Supplies [ ]  Nutrition Counsult [ ]  Fetal ECHO after 20 weeks  [ ]  Eye exam for retina evaluation   Baseline and surveillance labs (  . Gestational hypertension 09/23/2019   Guidelines for Antenatal Testing and Sonography  (with updated ICD-10 codes)  Updated  2019/09/08 with Dr. Tama High  INDICATION U/S 2 X week NST/AFI  or full BPP wkly DELIVERY Diabetes   A1 - good control - O24.410    A2 - good control - O24.419      A2  - poor control or poor compliance - O24.419, E11.65   (Macrosomia or polyhydramnios) **E11.65 is extra code for poor control**    A2/B - O24.  Marland Kitchen Headache   . Hypertension   . Infection    UTI  . Irritable bowel syndrome (IBS)    . Joint pain   . Morbid obesity with body mass index (BMI) of 45.0 to 49.9 in adult [facility])   . Obesity during pregnancy, antepartum 04/06/2019   Body mass index is 53.71 kg/m.  Recommendations [x]  Aspirin 81 mg daily after 12 weeks; discontinue after 36 weeks [ ]  Nutrition consult [ ]  Weight gain 11-20 lbs for singleton and 25-35 lbs for twin pregnancy (IOM guidelines) Higher class of obesity patients recommended to gain closer to lower limit  Weight loss is associated with adverse outcomes [ ]  Baseline and surveillance labs (pulled in fr  . Plantar fasciitis, bilateral   . Polycystic ovary syndrome   . Prediabetes    "prediabetes"  . Pregnancy induced hypertension   . Shortness of breath   . Sleep apnea    no CPAP use  . Supervision of normal first pregnancy 04/06/2019   [name]: [ x]Initial [ x]12 [ ] 20 [ ] 28 [ ] 32 [ ] 36 [ ] 38 [ ] 39 [ ] 40 Nursing Staff Provider Office Location [facility]  Dating   LMP Language  English  Anatomy US  Nml Flu Vaccine  Declined 07/28/19 Genetic  Screen  NIPS: low risks   AFP:   negative  TDaP vaccine   08/25/19 Hgb A1C or  GTT Early A1C 5.8 Third trimester: GDM insulin Rhogam  NA   LAB RESULTS  Feeding Plan Breast Blood Type  . Vertigo 2017    Patient Active Problem List   Diagnosis Date Noted  . Acute hypoxemic respiratory failure due to COVID-19 ([place]) 06/11/2020  . Hx of migraine headaches 04/09/2020  . Postpartum care following cesarean delivery 11/22/2019  . Contraceptive management 11/22/2019  . Positive RPR test 10/09/2019  . HTN (hypertension) 09/23/2019  . Migraine headache 07/12/2019  . Alpha thalassemia trait   . Vitamin D deficiency 09/16/2017  . Prediabetes 09/16/2017  . Depression 09/16/2017  . Class 3 severe obesity with serious comorbidity and body mass index (BMI) of 50.0 to 59.9 in adult ([place]) 09/16/2017  . PCOS (polycystic ovarian syndrome) 08/13/2017  . Mass 05/06/2017  . Vertigo 10/06/2016  . Microcytic anemia 09/26/2016   . Pseudotumor cerebri syndrome 08/28/2016  . Obesity hypoventilation syndrome ([place]) 08/28/2016  . Benign paroxysmal positional vertigo due to bilateral vestibular disorder 08/28/2016  . OSA (obstructive sleep apnea) 08/28/2016  . Hypersomnia with sleep apnea 08/28/2016  . Female hirsutism 08/28/2016  . GERD (gastroesophageal reflux disease) 02/24/2015  . Constipation 10/02/2014    Past Surgical History:  Procedure Laterality Date  . CESAREAN SECTION N/A 10/09/2019   Procedure: CESAREAN SECTION;  Surgeon: Aletha Halim, MD;  [facility]: [facility];  Service: Obstetrics;  Laterality: N/A;  . COLONOSCOPY WITH PROPOFOL  10/07/2016  . ESOPHAGOGASTRODUODENOSCOPY  12/21/2015  . EXCISION MASS UPPER EXTREMETIES Left 04/21/2017   Procedure: EXCISION MASS LEFT MIDDLE FINGER;  Surgeon: Daryll Brod, MD;  Location: [place];  Service: Orthopedics;  Laterality: Left;  . IR FL GUIDED LOC OF NEEDLE/CATH TIP FOR SPINAL INJECTION RT  03/13/2020    OB History    Gravida  1   Para  1   Term  1   Preterm  0   AB  0   Living  1     SAB  0   TAB  0   Ectopic  0   Multiple  0   Live Births  1            Home Medications    Prior to Admission medications   Medication Sig Start Date End Date Taking? Authorizing Provider  albuterol (VENTOLIN HFA) 108 (90 Base) MCG/ACT inhaler Inhale 2 puffs into the lungs every 6 (six) hours as needed for wheezing or shortness of breath. 06/15/20   Ghimire, Henreitta Leber, MD  amLODipine (NORVASC) 10 MG tablet Take 1 tablet (10 mg total) by mouth daily. 10/27/19   Lavonia Drafts, MD  baclofen (LIORESAL) 10 MG tablet Take 1 tablet (10 mg total) by mouth 2 (two) times daily. 02/28/20   Nafziger, Tommi Rumps, NP  Blood Pressure Monitoring KIT 1 kit by Does not apply route once a week. 05/05/19   Sloan Leiter, MD  ferrous sulfate 325 (65 FE) MG tablet Take 1 tablet (325 mg total) by mouth every other day. 02/23/20 06/11/20  Nafziger, Tommi Rumps, NP   hydrochlorothiazide (HYDRODIURIL) 25 MG tablet Take 1 tablet (25 mg total) by mouth daily. 10/27/19   Lavonia Drafts, MD  ibuprofen (ADVIL) 600 MG tablet Take 1 tablet (600 mg total) by mouth every 8 (eight) hours as needed. 07/10/20   Rosemarie Ax, MD  loratadine (CLARITIN) 10 MG tablet Take 10 mg by  mouth daily as needed for allergies.     [provider]  pantoprazole (PROTONIX) 40 MG tablet Take 1 tablet (40 mg total) by mouth daily. 05/04/20   Nafziger, Tommi Rumps, NP  polyethylene glycol powder (GLYCOLAX/MIRALAX) 17 GM/SCOOP powder Take 17 g by mouth daily as needed for moderate constipation.  10/11/19   [provider]  Semaglutide-Weight Management (WEGOVY) 0.5 MG/0.5ML SOAJ Inject 0.5 mg into the skin once a week. 06/06/20   Eber Jones, MD  topiramate (TOPAMAX) 25 MG tablet Take 2 tablets (50 mg total) by mouth 2 (two) times daily. 02/23/20   Dohmeier, Asencion Partridge, MD  Vitamin D, Ergocalciferol, (DRISDOL) 1.25 MG (50000 UNIT) CAPS capsule Take 1 capsule (50,000 Units total) by mouth every 7 (seven) days. 04/24/20   Eber Jones, MD    Family History Family History  Problem Relation Age of Onset  . Diabetes Maternal Aunt   . Hypertension Maternal Uncle   . Depression Maternal Grandfather   . Diabetes Maternal Grandfather   . Hypertension Maternal Grandfather   . Arthritis Mother   . High blood pressure Mother   . Depression Mother   . Sleep apnea Mother   . Obesity Mother   . Diabetes Mother   . Hypertension Mother   . Sleep apnea Father   . Obesity Father     Social History Social History   Tobacco Use  . Smoking status: Never Smoker  . Smokeless tobacco: Never Used  Vaping Use  . Vaping Use: Never used  Substance Use Topics  . Alcohol use: Not Currently    Comment: social   . Drug use: No     Allergies   Bee pollen, Bee venom, Hydrocodone-acetaminophen, Peach flavor, Pollen extract, and Remdesivir   Review of Systems Review  of Systems  See HPI  Physical Exam Triage Vital Signs ED Triage Vitals [07/10/20 1852]  Enc Vitals Group     BP 139/70     Pulse Rate (!) 106     Resp 20     Temp 99.4 F (37.4 C)     Temp Source Oral     SpO2 99 %     Weight (!) 434 lb (196.9 kg)     Height 6' 2.5" (1.892 m)     Head Circumference      Peak Flow      Pain Score 9     Pain Loc      Pain Edu?      Excl. in [place]?    No data found.  Updated Vital Signs BP 139/70   Pulse (!) 106   Temp 99.4 F (37.4 C) (Oral)   Resp 20   Ht 6' 2.5" (1.892 m)   Wt (!) 196.9 kg   SpO2 99%   BMI 54.98 kg/m   Visual Acuity Right Eye Distance:   Left Eye Distance:   Bilateral Distance:    Right Eye Near:   Left Eye Near:    Bilateral Near:     Physical Exam Gen: NAD, alert, cooperative with exam, well-appearing ENT: normal lips, normal nasal mucosa,  Skin: no rashes, no areas of induration  Neuro: normal tone, normal sensation to touch Psych:  normal insight, alert and oriented MSK:  Right ankle:  Soft tissue swelling of the lateral compartment. Limited range of motion. Negative anterior drawer. Neurovascular intact   UC Treatments / Results  Labs (all labs ordered are listed, but only abnormal results are displayed) Labs Reviewed - No  data to display  EKG   Radiology DG Ankle Complete Right  Result Date: 07/10/2020 CLINICAL DATA:  Pain EXAM: RIGHT ANKLE - COMPLETE 3+ VIEW COMPARISON:  None. FINDINGS: There is soft tissue swelling about the ankle without evidence for an acute displaced fracture or dislocation. There are no significant degenerative changes. IMPRESSION: Soft tissue swelling without evidence for an acute displaced fracture or dislocation. Electronically Signed   By: Constance Holster M.D.   On: 07/10/2020 19:15    Procedures Procedures (including critical care time)  Medications Ordered in UC Medications - No data to display  Initial Impression / Assessment and Plan / UC Course  I  have reviewed the triage vital signs and the nursing notes.  Pertinent labs & imaging results that were available during my care of the patient were reviewed by me and considered in my medical decision making (see chart for details).     Ms. Deluna is a 24 year old female is presenting with right ankle pain following an injury.  Imaging was unrevealing for fracture.  Likely for ankle sprain.  Provided Cam walker and crutches.  Provided ibuprofen.  Counseled supportive care.  Given indications on follow-up.  Final Clinical Impressions(s) / UC Diagnoses   Final diagnoses:  Sprain of anterior talofibular ligament of right ankle, initial encounter     Discharge Instructions     Please try ice Please try ibuprofen as needed  Please follow up with sports medicine      ED Prescriptions    Medication Sig Dispense Auth. Provider   ibuprofen (ADVIL) 600 MG tablet Take 1 tablet (600 mg total) by mouth every 8 (eight) hours as needed. 30 tablet Rosemarie Ax, MD     PDMP not reviewed this encounter.   Rosemarie Ax, MD 07/10/20 2202

## 2020-07-10 NOTE — ED Triage Notes (Signed)
Pt states she slipped and fell and her right leg went under her body and she hit her head on the concrete. Pt denies LOC. Pt denies dizziness or vision changes. Pt states she has a HA and injured her right ankle. Pt has 2+ swelling of right ankle, pt has 1+ right pedal pulse, cap refill less than 3 sec, warm to touch, pt has decreased ROM of ankle. PT arrived to exam room in wheel chair.

## 2020-07-10 NOTE — Discharge Instructions (Signed)
Please try ice Please try ibuprofen as needed  Please follow up with sports medicine

## 2020-07-11 ENCOUNTER — Ambulatory Visit: Payer: Self-pay

## 2020-07-11 ENCOUNTER — Encounter: Payer: Self-pay | Admitting: Adult Health

## 2020-07-11 ENCOUNTER — Encounter: Payer: Self-pay | Admitting: Family Medicine

## 2020-07-11 ENCOUNTER — Ambulatory Visit (INDEPENDENT_AMBULATORY_CARE_PROVIDER_SITE_OTHER): Payer: BC Managed Care – PPO | Admitting: Family Medicine

## 2020-07-11 VITALS — BP 158/64 | Ht 74.5 in | Wt >= 6400 oz

## 2020-07-11 DIAGNOSIS — M25571 Pain in right ankle and joints of right foot: Secondary | ICD-10-CM

## 2020-07-11 MED ORDER — DICLOFENAC SODIUM 75 MG PO TBEC: 75.0000 mg | DELAYED_RELEASE_TABLET | Freq: Two times a day (BID) | ORAL | 1 refills | Status: DC

## 2020-07-11 NOTE — Progress Notes (Signed)
PCP: Dorothyann Peng, NP  Subjective:   HPI: Patient is a 24 y.o. female here for right ankle injury.  Patient reports yesterday she slipped with right foot going behind her leading to inversion injury of this ankle before she struck head on concrete.  No loss of consciousness. Immediate pain lateral right ankle and swelling. Unable to bear weight. Felt like ankle was broken. Has been icing, taking ibuprofen without much benefit. Wearing a boot and using crutches.  Past Medical History:  Diagnosis Date  . Abnormal uterine bleeding   . Acid reflux   . Alpha thalassemia trait   . Amenorrhea   . Anemia    no current med.  . Back pain   . Cesarean delivery delivered 10/11/2019   10/09/2019 - primary CS for failed IOL  . Chest pain   . Complication of anesthesia    states had to keep giving her anesthesia during EGD  . Constipation   . Depression    "I'm good"  . Finger mass, left 03/2017   middle finger  . Gestational diabetes   . Gestational diabetes 05/23/2019   Current Diabetic Medications:  None  _0  Aspirin 81 mg daily after 12 weeks (? A2/B GDM)  Required Referrals for A1GDM or A2GDM: _1  Diabetes Education and Testing Supplies _2  Nutrition Cousult  For A2/B GDM or higher classes of DM _3  Diabetes Education and Testing Supplies _4  Nutrition Counsult _5  Fetal ECHO after 20 weeks  _6  Eye exam for retina evaluation   Baseline and surveillance labs (  . Gestational hypertension 09/23/2019   Guidelines for Antenatal Testing and Sonography  (with updated ICD-10 codes)  Updated  05-Oct-2019 with Dr. Tama High  INDICATION U/S 2 X week NST/AFI  or full BPP wkly DELIVERY Diabetes   A1 - good control - O24.410    A2 - good control - O24.419      A2  - poor control or poor compliance - O24.419, E11.65   (Macrosomia or polyhydramnios) **E11.65 is extra code for poor control**    A2/B - O24.  Marland Kitchen Headache   . Hypertension   . Infection    UTI  . Irritable bowel syndrome (IBS)   .  Joint pain   . Morbid obesity with body mass index (BMI) of 45.0 to 49.9 in adult [facility])   . Obesity during pregnancy, antepartum 04/06/2019   Body mass index is 53.71 kg/m.  Recommendations _7  Aspirin 81 mg daily after 12 weeks; discontinue after 36 weeks _8  Nutrition consult _9  Weight gain 11-20 lbs for singleton and 25-35 lbs for twin pregnancy (IOM guidelines) Higher class of obesity patients recommended to gain closer to lower limit  Weight loss is associated with adverse outcomes _10  Baseline and surveillance labs (pulled in fr  . Plantar fasciitis, bilateral   . Polycystic ovary syndrome   . Prediabetes    "prediabetes"  . Pregnancy induced hypertension   . Shortness of breath   . Sleep apnea    no CPAP use  . Supervision of normal first pregnancy 04/06/2019   [name]: [ x]Initial [ x]12 _11 20 _12 28 _13 32 _14 36 _15 38 _16 39 _17 40 Nursing Staff Provider Office Location [facility]  Dating   LMP Language  English  Anatomy US  Nml Flu Vaccine  Declined 07/28/19 Genetic Screen  NIPS: low risks   AFP:   negative  TDaP vaccine   08/25/19 Hgb A1C  or  GTT Early A1C 5.8 Third trimester: GDM insulin Rhogam  NA   LAB RESULTS  Feeding Plan Breast Blood Type  . Vertigo 2017    Current Outpatient Medications on File Prior to Visit  Medication Sig Dispense Refill  . albuterol (VENTOLIN HFA) 108 (90 Base) MCG/ACT inhaler Inhale 2 puffs into the lungs every 6 (six) hours as needed for wheezing or shortness of breath. 8 g 0  . amLODipine (NORVASC) 10 MG tablet Take 1 tablet (10 mg total) by mouth daily. 30 tablet 2  . baclofen (LIORESAL) 10 MG tablet Take 1 tablet (10 mg total) by mouth 2 (two) times daily. 30 each 0  . Blood Pressure Monitoring KIT 1 kit by Does not apply route once a week. 1 kit 0  . ferrous sulfate 325 (65 FE) MG tablet Take 1 tablet (325 mg total) by mouth every other day. 45 tablet 1  . hydrochlorothiazide (HYDRODIURIL) 25 MG tablet Take 1 tablet (25 mg total) by mouth  daily. 30 tablet 2  . loratadine (CLARITIN) 10 MG tablet Take 10 mg by mouth daily as needed for allergies.     . metFORMIN (GLUCOPHAGE-XR) 500 MG 24 hr tablet SMARTSIG:2 Tablet(s) By Mouth Morning-Evening    . pantoprazole (PROTONIX) 40 MG tablet Take 1 tablet (40 mg total) by mouth daily. 30 tablet 3  . polyethylene glycol powder (GLYCOLAX/MIRALAX) 17 GM/SCOOP powder Take 17 g by mouth daily as needed for moderate constipation.     . Semaglutide-Weight Management (WEGOVY) 0.5 MG/0.5ML SOAJ Inject 0.5 mg into the skin once a week. 2 mL 0  . topiramate (TOPAMAX) 25 MG tablet Take 2 tablets (50 mg total) by mouth 2 (two) times daily. 180 tablet 1  . Vitamin D, Ergocalciferol, (DRISDOL) 1.25 MG (50000 UNIT) CAPS capsule Take 1 capsule (50,000 Units total) by mouth every 7 (seven) days. 4 capsule 0   No current facility-administered medications on file prior to visit.    Past Surgical History:  Procedure Laterality Date  . CESAREAN SECTION N/A 10/09/2019   Procedure: CESAREAN SECTION;  Surgeon: Aletha Halim, MD;  Location: [facility];  Service: Obstetrics;  Laterality: N/A;  . COLONOSCOPY WITH PROPOFOL  10/07/2016  . ESOPHAGOGASTRODUODENOSCOPY  12/21/2015  . EXCISION MASS UPPER EXTREMETIES Left 04/21/2017   Procedure: EXCISION MASS LEFT MIDDLE FINGER;  Surgeon: Daryll Brod, MD;  Location: [place];  Service: Orthopedics;  Laterality: Left;  . IR FL GUIDED LOC OF NEEDLE/CATH TIP FOR SPINAL INJECTION RT  03/13/2020    Allergies  Allergen Reactions  . Bee Pollen Hives, Shortness Of Breath and Swelling  . Bee Venom Hives, Shortness Of Breath and Swelling  . Hydrocodone-Acetaminophen Anaphylaxis    No problem when she takes Tylenol  . Peach Flavor Hives, Shortness Of Breath and Other (See Comments)    SWELLING OF MOUTH  . Pollen Extract Hives, Shortness Of Breath and Swelling  . Remdesivir Swelling    Angioedema 8/25    Social History   Socioeconomic History  . Marital  status: Single    Spouse name: Not on file  . Number of children: Not on file  . Years of education: Not on file  . Highest education level: Not on file  Occupational History  . Occupation: Chemical engineer: [id number]  Tobacco Use  . Smoking status: Never Smoker  . Smokeless tobacco: Never Used  Vaping Use  . Vaping Use: Never used  Substance and Sexual Activity  .  Alcohol use: Not Currently    Comment: social   . Drug use: No  . Sexual activity: Not Currently    Birth control/protection: None, Injection  Other Topics Concern  . Not on file  Social History Narrative   Right handed    Soda sometimes   Lives with mom and cousin, a grandmother in [place] also helps care for her.       She is in nursing school.    Social Determinants of Health   Financial Resource Strain:   . Difficulty of Paying Living Expenses: Not on file  Food Insecurity:   . Worried About Charity fundraiser in the Last Year: Not on file  . Ran Out of Food in the Last Year: Not on file  Transportation Needs:   . Lack of Transportation (Medical): Not on file  . Lack of Transportation (Non-Medical): Not on file  Physical Activity:   . Days of Exercise per Week: Not on file  . Minutes of Exercise per Session: Not on file  Stress:   . Feeling of Stress : Not on file  Social Connections:   . Frequency of Communication with Friends and Family: Not on file  . Frequency of Social Gatherings with Friends and Family: Not on file  . Attends Religious Services: Not on file  . Active Member of Clubs or Organizations: Not on file  . Attends Archivist Meetings: Not on file  . Marital Status: Not on file  Intimate Partner Violence:   . Fear of Current or Ex-Partner: Not on file  . Emotionally Abused: Not on file  . Physically Abused: Not on file  . Sexually Abused: Not on file    Family History  Problem Relation Age of Onset  . Diabetes Maternal Aunt   . Hypertension Maternal Uncle    . Depression Maternal Grandfather   . Diabetes Maternal Grandfather   . Hypertension Maternal Grandfather   . Arthritis Mother   . High blood pressure Mother   . Depression Mother   . Sleep apnea Mother   . Obesity Mother   . Diabetes Mother   . Hypertension Mother   . Sleep apnea Father   . Obesity Father     BP (!) 158/64   Ht 6' 2.5" (1.892 m)   Wt (!) 434 lb (196.9 kg)   BMI 54.98 kg/m   No flowsheet data found.  No flowsheet data found.  Review of Systems: See HPI above.     Objective:  Physical Exam:  Gen: NAD, comfortable in exam room  Right ankle: Mod lateral swelling.  No bruising, other deformity. Very limited motion all directions TTP greatest over ATFL, lateral malleolus.  No medial malleolus, base 5th, navicular tenderness. Ant drawer and talar tilt deferred.   Negative syndesmotic compression. Thompsons test negative. NV intact distally.  Limited MSK right ankle:  Peroneal tendons intact with small amount of fluid in sheath at level of ankle.  No cortical irregularity of lateral malleolus.   Assessment & Plan:  1. Right ankle injury - independently reviewed radiographs and no evidence fracture.  No full thickness peroneal tears seen.  2/2 severe lateral ankle sprain.  Icing, diclofenac with tylenol as needed.  Motion exercises reviewed.  Boot, crutches.  F/u in 1 week.

## 2020-07-11 NOTE — Patient Instructions (Signed)
You have a severe ankle sprain. Ice the area for 15 minutes at a time, 3-4 times a day Diclofenac 75mg  twice a day with food for pain and inflammation - stop taking ibuprofen. Ok to take tylenol, use topical medications like biofreeze if needed also. Elevate above the level of your heart when possible Crutches if needed to help with walking Bear weight when tolerated Use boot when up and walking around to help with stability while you recover from this injury. Come out of the boot twice a day to do Up/down and alphabet exercises 2-3 sets of each. Consider physical therapy for strengthening and balance exercises in the future. If not improving as expected, we may repeat x-rays or consider further testing like an MRI. Follow up in 1 week.

## 2020-07-13 ENCOUNTER — Ambulatory Visit: Payer: BC Managed Care – PPO | Admitting: Family Medicine

## 2020-07-16 NOTE — Telephone Encounter (Signed)
Can we use one of your virtual slots for this week for this patient to be seen in office?

## 2020-07-17 ENCOUNTER — Telehealth (INDEPENDENT_AMBULATORY_CARE_PROVIDER_SITE_OTHER): Payer: BC Managed Care – PPO | Admitting: Adult Health

## 2020-07-17 ENCOUNTER — Encounter: Payer: Self-pay | Admitting: Adult Health

## 2020-07-17 VITALS — Wt >= 6400 oz

## 2020-07-17 DIAGNOSIS — S93401A Sprain of unspecified ligament of right ankle, initial encounter: Secondary | ICD-10-CM

## 2020-07-17 DIAGNOSIS — S060X0A Concussion without loss of consciousness, initial encounter: Secondary | ICD-10-CM

## 2020-07-17 NOTE — Telephone Encounter (Signed)
Patient is scheduled for this afternoon at 5 for a virtual visit

## 2020-07-17 NOTE — Progress Notes (Signed)
Virtual Visit via Video Note  I connected with Maureen Lewis on 07/17/20 at  5:00 PM EDT by a video enabled telemedicine application and verified that I am speaking with the correct person using two identifiers.  Location patient: home Location provider:work or home office Persons participating in the virtual visit: patient, provider  I discussed the limitations of evaluation and management by telemedicine and the availability of in person appointments. The patient expressed understanding and agreed to proceed.   HPI: She was seen at urgent care on 07/10/2020 for pain to the right ankle following a fall.  She reports that she was walking down her cement stairs outside and stepped in a puddle of in her to slip.  When she slipped she hit the back of her head on a cement step in her right foot went behind her leading to inversion injury at the ankle.  She denies loss of consciousness.  In the UC her x-ray was negative for fracture  She was then seen by Dr. Barbaraann Barthel 1 day later which time an ultrasound was done which showed no full-thickness peroneal tears.  She has a 2 out of 2 severe lateral ankle sprain.  She was given a boot, crutches, and shin exercises.  She was prescribed Voltaren 75 mg twice daily.  Today she reports that she continues to have a constant headache, ringing in her ears, head pressure.  She denies dizziness, memory loss, nausea, or vomiting.  No new blurred vision.  She also continues to have constant pain in her right ankle.  She is not able to ambulate without extreme discomfort.  Reports that Voltaren is not helping much to alleviate her symptoms.   ROS: See pertinent positives and negatives per HPI.  Past Medical History:  Diagnosis Date  . Abnormal uterine bleeding   . Acid reflux   . Alpha thalassemia trait   . Amenorrhea   . Anemia    no current med.  . Back pain   . Cesarean delivery delivered 10/11/2019   10/09/2019 - primary CS for failed IOL  . Chest pain   .  Complication of anesthesia    states had to keep giving her anesthesia during EGD  . Constipation   . Depression    "I'm good"  . Finger mass, left 03/2017   middle finger  . Gestational diabetes   . Gestational diabetes 05/23/2019   Current Diabetic Medications:  None  [ ]  Aspirin 81 mg daily after 12 weeks (? A2/B GDM)  Required Referrals for A1GDM or A2GDM: [ ]  Diabetes Education and Testing Supplies [ ]  Nutrition Cousult  For A2/B GDM or higher classes of DM [ ]  Diabetes Education and Testing Supplies [ ]  Nutrition Counsult [ ]  Fetal ECHO after 20 weeks  [ ]  Eye exam for retina evaluation   Baseline and surveillance labs (  . Gestational hypertension 09/23/2019   Guidelines for Antenatal Testing and Sonography  (with updated ICD-10 codes)  Updated  09/14/2019 with Dr. Tama High  INDICATION U/S 2 X week NST/AFI  or full BPP wkly DELIVERY Diabetes   A1 - good control - O24.410    A2 - good control - O24.419      A2  - poor control or poor compliance - O24.419, E11.65   (Macrosomia or polyhydramnios) **E11.65 is extra code for poor control**    A2/B - O24.  Marland Kitchen Headache   . Hypertension   . Infection    UTI  . Irritable bowel syndrome (  IBS)   . Joint pain   . Morbid obesity with body mass index (BMI) of 45.0 to 49.9 in adult [facility])   . Obesity during pregnancy, antepartum 04/06/2019   Body mass index is 53.71 kg/m.  Recommendations $RemoveBeforeDE'[x]'ZhSobLNrlFHHjEb$  Aspirin 81 mg daily after 12 weeks; discontinue after 36 weeks $Remove'[ ]'afTmwQP$  Nutrition consult $RemoveBeforeDEI'[ ]'WJIprSpqzCAvARAO$  Weight gain 11-20 lbs for singleton and 25-35 lbs for twin pregnancy (IOM guidelines) Higher class of obesity patients recommended to gain closer to lower limit  Weight loss is associated with adverse outcomes $RemoveBeforeDE'[ ]'NJBpHfweNvGJfov$  Baseline and surveillance labs (pulled in fr  . Plantar fasciitis, bilateral   . Polycystic ovary syndrome   . Prediabetes    "prediabetes"  . Pregnancy induced hypertension   . Shortness of breath   . Sleep apnea    no CPAP use  . Supervision of normal first  pregnancy 04/06/2019   [name]: [ x]Initial [ x]12 $Remo'[ ]'ctTyB$ 20 $Remo'[ ]'GRgJt$ 28 $Remo'[ ]'rKqrf$ 32 $Remo'[ ]'zFxOd$ 36 $Remo'[ ]'EDbsV$ 38 $Remo'[ ]'EBFuo$ 39 $Remo'[ ]'JNZCz$ 40 Nursing Staff Provider Office Location [facility]  Dating   LMP Language  English  Anatomy US  Nml Flu Vaccine  Declined 07/28/19 Genetic Screen  NIPS: low risks   AFP:   negative  TDaP vaccine   08/25/19 Hgb A1C or  GTT Early A1C 5.8 Third trimester: GDM insulin Rhogam  NA   LAB RESULTS  Feeding Plan Breast Blood Type  . Vertigo 2017    Past Surgical History:  Procedure Laterality Date  . CESAREAN SECTION N/A 10/09/2019   Procedure: CESAREAN SECTION;  Surgeon: Aletha Halim, MD;  Location: [facility];  Service: Obstetrics;  Laterality: N/A;  . COLONOSCOPY WITH PROPOFOL  10/07/2016  . ESOPHAGOGASTRODUODENOSCOPY  12/21/2015  . EXCISION MASS UPPER EXTREMETIES Left 04/21/2017   Procedure: EXCISION MASS LEFT MIDDLE FINGER;  Surgeon: Daryll Brod, MD;  Location: [place];  Service: Orthopedics;  Laterality: Left;  . IR FL GUIDED LOC OF NEEDLE/CATH TIP FOR SPINAL INJECTION RT  03/13/2020    Family History  Problem Relation Age of Onset  . Diabetes Maternal Aunt   . Hypertension Maternal Uncle   . Depression Maternal Grandfather   . Diabetes Maternal Grandfather   . Hypertension Maternal Grandfather   . Arthritis Mother   . High blood pressure Mother   . Depression Mother   . Sleep apnea Mother   . Obesity Mother   . Diabetes Mother   . Hypertension Mother   . Sleep apnea Father   . Obesity Father        Current Outpatient Medications:  .  albuterol (VENTOLIN HFA) 108 (90 Base) MCG/ACT inhaler, Inhale 2 puffs into the lungs every 6 (six) hours as needed for wheezing or shortness of breath., Disp: 8 g, Rfl: 0 .  amLODipine (NORVASC) 10 MG tablet, Take 1 tablet (10 mg total) by mouth daily., Disp: 30 tablet, Rfl: 2 .  baclofen (LIORESAL) 10 MG tablet, Take 1 tablet (10 mg total) by mouth 2 (two) times daily., Disp: 30 each, Rfl: 0 .  Blood Pressure Monitoring KIT, 1 kit  by Does not apply route once a week., Disp: 1 kit, Rfl: 0 .  diclofenac (VOLTAREN) 75 MG EC tablet, Take 1 tablet (75 mg total) by mouth 2 (two) times daily., Disp: 60 tablet, Rfl: 1 .  hydrochlorothiazide (HYDRODIURIL) 25 MG tablet, Take 1 tablet (25 mg total) by mouth daily., Disp: 30 tablet, Rfl: 2 .  loratadine (CLARITIN) 10 MG tablet, Take 10 mg by mouth  daily as needed for allergies. , Disp: , Rfl:  .  metFORMIN (GLUCOPHAGE-XR) 500 MG 24 hr tablet, SMARTSIG:2 Tablet(s) By Mouth Morning-Evening, Disp: , Rfl:  .  pantoprazole (PROTONIX) 40 MG tablet, Take 1 tablet (40 mg total) by mouth daily., Disp: 30 tablet, Rfl: 3 .  polyethylene glycol powder (GLYCOLAX/MIRALAX) 17 GM/SCOOP powder, Take 17 g by mouth daily as needed for moderate constipation. , Disp: , Rfl:  .  Semaglutide-Weight Management (WEGOVY) 0.5 MG/0.5ML SOAJ, Inject 0.5 mg into the skin once a week., Disp: 2 mL, Rfl: 0 .  topiramate (TOPAMAX) 25 MG tablet, Take 2 tablets (50 mg total) by mouth 2 (two) times daily., Disp: 180 tablet, Rfl: 1 .  Vitamin D, Ergocalciferol, (DRISDOL) 1.25 MG (50000 UNIT) CAPS capsule, Take 1 capsule (50,000 Units total) by mouth every 7 (seven) days., Disp: 4 capsule, Rfl: 0 .  ferrous sulfate 325 (65 FE) MG tablet, Take 1 tablet (325 mg total) by mouth every other day., Disp: 45 tablet, Rfl: 1  EXAM:  VITALS per patient if applicable:  GENERAL: alert, oriented, appears well and in no acute distress  HEENT: atraumatic, conjunttiva clear, no obvious abnormalities on inspection of external nose and ears  NECK: normal movements of the head and neck  LUNGS: on inspection no signs of respiratory distress, breathing rate appears normal, no obvious gross SOB, gasping or wheezing  CV: no obvious cyanosis  MS: moves all visible extremities without noticeable abnormality  PSYCH/NEURO: pleasant and cooperative, no obvious depression or anxiety, speech and thought processing grossly intact  ASSESSMENT  AND PLAN:  Discussed the following assessment and plan:  1. Sprain of right ankle, unspecified ligament, initial encounter -Fortunately she has a severe anaphylactic allergy to hydrocodone.  Advised to continue with anti-inflammatories, ice, elevation, and do her home exercises.  Follow-up with sports medicine as directed.  2. Concussion without loss of consciousness, initial encounter -Gust the importance of staying away from stimulation, taking anti-inflammatories, and resting.  Will refer to concussion clinic for further evaluation. - Ambulatory referral to Sports Medicine     I discussed the assessment and treatment plan with the patient. The patient was provided an opportunity to ask questions and all were answered. The patient agreed with the plan and demonstrated an understanding of the instructions.   The patient was advised to call back or seek an in-person evaluation if the symptoms worsen or if the condition fails to improve as anticipated.   Dorothyann Peng, NP

## 2020-07-18 ENCOUNTER — Ambulatory Visit (INDEPENDENT_AMBULATORY_CARE_PROVIDER_SITE_OTHER): Payer: BC Managed Care – PPO | Admitting: Family Medicine

## 2020-07-18 ENCOUNTER — Other Ambulatory Visit: Payer: Self-pay

## 2020-07-18 ENCOUNTER — Telehealth: Payer: Self-pay | Admitting: Physical Therapy

## 2020-07-18 DIAGNOSIS — M25571 Pain in right ankle and joints of right foot: Secondary | ICD-10-CM | POA: Diagnosis not present

## 2020-07-18 DIAGNOSIS — S93401D Sprain of unspecified ligament of right ankle, subsequent encounter: Secondary | ICD-10-CM

## 2020-07-18 DIAGNOSIS — S93409A Sprain of unspecified ligament of unspecified ankle, initial encounter: Secondary | ICD-10-CM | POA: Insufficient documentation

## 2020-07-18 NOTE — Progress Notes (Signed)
    SUBJECTIVE:   CHIEF COMPLAINT / HPI:   Right Ankle Sprain Patient is improving overall and is off crutches.  She is still using the boot however she continues to come out of the boot 3 times a day to do her range of motion exercises and states that she is tolerating it well.  She states her pain is still present but it is not as bad as it was last week and she can tolerate walking in the boot and putting weight on her foot as long as she wears the boot.  She states she did try just briefly walking without the boot and it was too painful.  Swelling has improved and overall she is doing better.  PERTINENT  PMH / PSH: Hypertension, OSA, PCOS  OBJECTIVE:   BP 124/61   Ht 6' 2.5" (1.892 m)   Wt (!) 434 lb (196.9 kg)   BMI 54.98 kg/m   [facility] Adult [facility] 07/18/2020  Frequency of aerobic exercise (# of days/week) 0  Average time in minutes 0  Frequency of strengthening activities (# of days/week) 0   Ankle/Foot, Right: No visible erythema, positive for mild swelling, no ecchymosis or bony deformity. Range of motion is full and mildly painful in all directions. Strength is 5/5 in all directions. Able to walk 4 steps in the boot.  Special Tests:   - Anterior Drawer test: could not tolerate   - Talar Tilt test: could not tolerate   - Syndesmotic Squeeze test: NEG     ASSESSMENT/PLAN:   Sprain of ankle Moderate to severe ankle sprain with negative x-rays and improving by wearing the boot.  I will have her slowly progress out of the boot and into ASO ankle brace which she was given today at her appointment.  I also have her continue her range of motion exercises and over the next week slowly incorporate strengthening exercises of the ankle.  I let her know over the next 1-2 weeks she should be out of the boot completely and only use it for comfort in the meantime.  If she is going to walk a walker walking with either the boot or the ankle brace on at all times.  Instructed  her not to wear the ankle brace at nighttime. - Follow up with [name] in 2 weeks to ensure she continues to improve.  If she does not continue to get better or gets worse we will repeat x-rays at that time.     Nuala Alpha, DO PGY-4, Sports Medicine Fellow [place]  I was the preceptor for this visit and available for immediate consultation Shellia Cleverly, DO

## 2020-07-18 NOTE — Assessment & Plan Note (Signed)
Moderate to severe ankle sprain with negative x-rays and improving by wearing the boot.  I will have her slowly progress out of the boot and into ASO ankle brace which she was given today at her appointment.  I also have her continue her range of motion exercises and over the next week slowly incorporate strengthening exercises of the ankle.  I let her know over the next 1-2 weeks she should be out of the boot completely and only use it for comfort in the meantime.  If she is going to walk a walker walking with either the boot or the ankle brace on at all times.  Instructed her not to wear the ankle brace at nighttime. - Follow up with [name] in 2 weeks to ensure she continues to improve.  If she does not continue to get better or gets worse we will repeat x-rays at that time.

## 2020-07-18 NOTE — Telephone Encounter (Signed)
Appt scheduled for 07/20/2020 with Dr. Georgina Snell.

## 2020-07-18 NOTE — Patient Instructions (Signed)
It was great to meet you today! Thank you for letting me participate in your care!  Today, we discussed your right ankle sprain and I am glad it is getting better. Over the next week this should continue to improve. Continue to use ice after activity and to come out of the boot 3 times per day to work on the range of motion exercises. You can use heat during the day as well. I also think you should add extra strength Tylenol to help with controlling pain.  Over the next week you should improve enough to start walking out of the boot. When you do please wear the lace up brace we gave you today when you are active and walking. If you are feeling good enough please start the strength exercises we showed you as well. Don't sleep in the brace. I will see you back in 2 weeks.  Be well, Harolyn Rutherford, DO PGY-4, Sports Medicine Fellow [place]

## 2020-07-18 NOTE — Telephone Encounter (Signed)
Called pt to schedule a new pt concussion visit but VM was full and unable to leave a message.  If pt returns call, please schedule in any appropriate 30 min appt slot between 8am-3:30 pm.

## 2020-07-20 ENCOUNTER — Ambulatory Visit (INDEPENDENT_AMBULATORY_CARE_PROVIDER_SITE_OTHER): Payer: BC Managed Care – PPO | Admitting: Family Medicine

## 2020-07-20 ENCOUNTER — Ambulatory Visit (INDEPENDENT_AMBULATORY_CARE_PROVIDER_SITE_OTHER): Payer: Medicaid Other

## 2020-07-20 ENCOUNTER — Other Ambulatory Visit: Payer: Self-pay

## 2020-07-20 VITALS — Wt >= 6400 oz

## 2020-07-20 DIAGNOSIS — U071 COVID-19: Secondary | ICD-10-CM

## 2020-07-20 DIAGNOSIS — H8113 Benign paroxysmal vertigo, bilateral: Secondary | ICD-10-CM | POA: Diagnosis not present

## 2020-07-20 DIAGNOSIS — S060X0A Concussion without loss of consciousness, initial encounter: Secondary | ICD-10-CM | POA: Diagnosis not present

## 2020-07-20 DIAGNOSIS — G932 Benign intracranial hypertension: Secondary | ICD-10-CM | POA: Diagnosis not present

## 2020-07-20 MED ORDER — NORTRIPTYLINE HCL 25 MG PO CAPS: 25.0000 mg | ORAL_CAPSULE | Freq: Every day | ORAL | 2 refills | Status: DC

## 2020-07-20 NOTE — Progress Notes (Signed)
Subjective:    Chief Complaint: Maureen Lewis,  is a 24 y.o. female who presents for Concussion.  Patient fell while walking on the stairs on September 21.  She was seen in emergency room and diagnosed with an ankle sprain.  She additionally started having worsening headache and photophobia around that time but it was somewhat lost to her ankle sprain.  She notes the symptom has been worsening and her PCP referred her to me to evaluate her concussion.  She has been seen by my sports medicine colleagues for her ankle sprain but not for concussion yet.  Prior to this injury she already was dealing with headaches thought to be due to pseudotumor cerebri and vertigo thought to be due to BPPV.  She takes Topamax for headaches.  She has never been to vestibular physical therapy for her vertigo.    Injury date : 07/10/20 Visit #: 1   History of Present Illness:    Concussion Self-Reported Symptom Score Symptoms rated on a scale 1-6, in last 24 hours   Headache: 6    Nausea: 3  Dizziness: 5  Vomiting: 0  Balance Difficulty: 3   Trouble Falling Asleep: 6   Fatigue: 4  Sleep Less Than Usual: 6  Daytime Drowsiness: 6  Sleep More Than Usual: 0  Photophobia: 6  Phonophobia: 6  Irritability: 3  Sadness: 0  Numbness or Tingling: 4  Nervousness: 0  Feeling More Emotional: 3  Feeling Mentally Foggy: 4  Feeling Slowed Down: 4  Memory Problems: 4  Difficulty Concentrating: 5  Visual Problems: 3   Total Symptom Score: 81 Previous Symptom Score: NA   Neck Pain: Yes/No  Tinnitus: Yes/No  Review of Systems: No fevers or chills    Review of History: As above plus sleep apnea hypertension pseudotumor cerebri BPPV and recent ankle sprain.  Obstructive sleep apnea will be getting CPAP soon.  Objective:    Physical Examination Weight 449 pounds MSK: C-spine normal motion wearing boot right leg Neuro: Alert and oriented normal coronation.  Balance not tested due to cam  walker boot Psych: Alert and oriented normal speech thought process and affect.   Pertinent radiology to concussion: Prior CT head 03/09/20 STUDY DATE: 03/09/2020 PATIENT NAME: Maureen Lewis DOB:30-Jun-1996 MRN: [id number]  ORDERING CLINICIAN: Dr Dohmier CLINICAL HISTORY:23year patient with headaches and pseudotumor cerebri COMPARISON FILMS: CT Head wo  07/16/2017 EXAM: CT head wo TECHNIQUE: CT scan of the head was obtained utilizing 5 mm axial slices from the skull base to the vertex. CONTRAST: none IMAGING SITE: [facility] FINDINGS:   The cortical sulci, fissures and cisterns are normal in size and appearance. Lateral, third and fourth ventricle are normal in size and appearance. No extra-axial fluid collections are seen. No intracranial hemorrhage. No evidence of mass effect or midline shift. The orbits and their contents, paranasal sinuses and calvarium are unremarkable.      IMPRESSION: Normal CT scan of the head without contrast      INTERPRETING PHYSICIAN:  PRAMOD SETHI, MD Certified in Neuroimaging by [place] [employer] and [employer] ,I, [name], personally (independently) visualized and performed the interpretation of the images attached in this note.   Assessment and Plan   24 y.o. female with concussion in the setting of pre-existing vertigo thought to be due to BPPV and headache thought to be due to pseudotumor cerebri.  Dominant symptoms today are difficulty sleeping headache photophobia photophobia and vertigo.  She is out of  work anyway because of her ankle.  Discussed options.  Plan to continue Topamax and add nortriptyline.  Nortriptyline may be helpful for insomnia and for headache.  Additionally will refer to vestibular physical therapy as this should be helpful for the BPPV.  Recheck in 2 weeks.  She can consolidate her care for her ankle sprain and her concussion.  She is seeing 2  different sports medicine group for this.  Either follow-up with them or with me for both.      Action/Discussion: Reviewed diagnosis, management options, expected outcomes, and the reasons for scheduled and emergent follow-up. Questions were adequately answered. Patient expressed verbal understanding and agreement with the following plan.     Patient Education:  Reviewed with patient the risks (i.e, a repeat concussion, post-concussion syndrome, second-impact syndrome) of returning to play prior to complete resolution, and thoroughly reviewed the signs and symptoms of concussion.Reviewed need for complete resolution of all symptoms, with rest AND exertion, prior to return to play.  Reviewed red flags for urgent medical evaluation: worsening symptoms, nausea/vomiting, intractable headache, musculoskeletal changes, focal neurological deficits.  [facility]'s Concussion Care Plan, which clearly outlines the plans stated above, was given to patient.   In addition to the time spent performing tests, I spent 30 min   Reviewed with patient the risks (i.e, a repeat concussion, post-concussion syndrome, second-impact syndrome) of returning to play prior to complete resolution, and thoroughly reviewed the signs and symptoms of      concussion. Reviewedf need for complete resolution of all symptoms, with rest AND exertion, prior to return to play.  Reviewed red flags for urgent medical evaluation: worsening symptoms, nausea/vomiting, intractable headache, musculoskeletal changes, focal neurological deficits.  [facility]'s Concussion Care Plan, which clearly outlines the plans stated above, was given to patient   After Visit Summary printed out and provided to patient as appropriate.  The above documentation has been reviewed and is accurate and complete [name]

## 2020-07-20 NOTE — Patient Instructions (Addendum)
Thank you for coming in today.  Take the nortyptline at bedtime 1-2 pills for sleep and headache prevention.  OK to continue topamax.   Plan for vestibular PT. Call them to schedule.   Recheck with me or the sports docs in 2 weeks.  If seeing me ask for a 30 min visit.

## 2020-07-23 ENCOUNTER — Other Ambulatory Visit: Payer: Self-pay

## 2020-07-23 ENCOUNTER — Encounter: Payer: Self-pay | Admitting: Rheumatology

## 2020-07-23 ENCOUNTER — Ambulatory Visit: Payer: Self-pay

## 2020-07-23 ENCOUNTER — Ambulatory Visit (INDEPENDENT_AMBULATORY_CARE_PROVIDER_SITE_OTHER): Payer: BC Managed Care – PPO | Admitting: Rheumatology

## 2020-07-23 VITALS — BP 153/85 | HR 89 | Resp 18 | Ht 74.5 in | Wt >= 6400 oz

## 2020-07-23 DIAGNOSIS — G932 Benign intracranial hypertension: Secondary | ICD-10-CM

## 2020-07-23 DIAGNOSIS — H8113 Benign paroxysmal vertigo, bilateral: Secondary | ICD-10-CM | POA: Diagnosis not present

## 2020-07-23 DIAGNOSIS — D563 Thalassemia minor: Secondary | ICD-10-CM

## 2020-07-23 DIAGNOSIS — J9601 Acute respiratory failure with hypoxia: Secondary | ICD-10-CM

## 2020-07-23 DIAGNOSIS — M791 Myalgia, unspecified site: Secondary | ICD-10-CM

## 2020-07-23 DIAGNOSIS — I10 Essential (primary) hypertension: Secondary | ICD-10-CM

## 2020-07-23 DIAGNOSIS — G4710 Hypersomnia, unspecified: Secondary | ICD-10-CM | POA: Diagnosis not present

## 2020-07-23 DIAGNOSIS — U071 COVID-19: Secondary | ICD-10-CM

## 2020-07-23 DIAGNOSIS — R7303 Prediabetes: Secondary | ICD-10-CM

## 2020-07-23 DIAGNOSIS — R768 Other specified abnormal immunological findings in serum: Secondary | ICD-10-CM

## 2020-07-23 DIAGNOSIS — Z8719 Personal history of other diseases of the digestive system: Secondary | ICD-10-CM | POA: Diagnosis not present

## 2020-07-23 DIAGNOSIS — G4733 Obstructive sleep apnea (adult) (pediatric): Secondary | ICD-10-CM

## 2020-07-23 DIAGNOSIS — M79642 Pain in left hand: Secondary | ICD-10-CM

## 2020-07-23 DIAGNOSIS — M79641 Pain in right hand: Secondary | ICD-10-CM | POA: Diagnosis not present

## 2020-07-23 DIAGNOSIS — R700 Elevated erythrocyte sedimentation rate: Secondary | ICD-10-CM | POA: Diagnosis not present

## 2020-07-23 DIAGNOSIS — Z8669 Personal history of other diseases of the nervous system and sense organs: Secondary | ICD-10-CM

## 2020-07-23 DIAGNOSIS — G4730 Sleep apnea, unspecified: Secondary | ICD-10-CM

## 2020-07-23 DIAGNOSIS — L680 Hirsutism: Secondary | ICD-10-CM

## 2020-07-23 DIAGNOSIS — D509 Iron deficiency anemia, unspecified: Secondary | ICD-10-CM

## 2020-07-23 DIAGNOSIS — E662 Morbid (severe) obesity with alveolar hypoventilation: Secondary | ICD-10-CM

## 2020-07-23 DIAGNOSIS — E559 Vitamin D deficiency, unspecified: Secondary | ICD-10-CM

## 2020-07-23 DIAGNOSIS — E282 Polycystic ovarian syndrome: Secondary | ICD-10-CM

## 2020-07-23 NOTE — Progress Notes (Signed)
Office Visit Note  Patient: Maureen Lewis             Date of Birth: 1996-05-15           MRN: [id number]             PCP: Dorothyann Peng, NP Referring: Dorothyann Peng, NP Visit Date: 07/23/2020 Occupation: _0 @  Subjective:  Generalized pain.   History of Present Illness: Maureen Lewis is a 24 y.o. female seen in consultation per request of her PCP.  According to the patient in March 2021 she felt that her whole body was swollen and painful she was having difficulty walking.  She states she had labs done by her PCP in May which showed elevated sedimentation rate.  She states she continues to feel swollen.  She had to change her size of clothes due to close being tighter.  She states pain is mostly in her lower back and her legs and sometimes in her hands.  1-1/2 weeks ago she fell and hit her head and had concussion injury and also sprained her ankle.  There is no particular joint swelling.  She denies any history of oral ulcers, nasal ulcers, malar rash, photosensitivity, sicca symptoms, Raynaud's phenomenon, lymphadenopathy.  She is gravida 1, para 1, miscarriages 0.  There is no family history of autoimmune disease.  She states she was hospitalized in August due to COVID-19 infection.  She completely recovered from it.  He is followed by Dr. Brett Fairy for pseudotumor cerebri, vertigo and sleep apnea.  Activities of Daily Living:  Patient reports morning stiffness for 30-60  minutes.   Patient Reports nocturnal pain.  Difficulty dressing/grooming: Denies Difficulty climbing stairs: Denies Difficulty getting out of chair: Denies Difficulty using hands for taps, buttons, cutlery, and/or writing: Denies  Review of Systems  Constitutional: Negative for fatigue, night sweats, weight gain and weight loss.  HENT: Negative for mouth sores, trouble swallowing, trouble swallowing, mouth dryness and nose dryness.   Eyes: Negative for pain, redness, itching, visual  disturbance and dryness.  Respiratory: Negative for cough, shortness of breath and difficulty breathing.   Cardiovascular: Negative for chest pain, palpitations, hypertension, irregular heartbeat and swelling in legs/feet.  Gastrointestinal: Negative for blood in stool, constipation and diarrhea.  Endocrine: Negative for increased urination.  Genitourinary: Negative for difficulty urinating, painful urination and vaginal dryness.  Musculoskeletal: Positive for arthralgias, joint pain, joint swelling and morning stiffness. Negative for myalgias, muscle weakness, muscle tenderness and myalgias.  Skin: Negative for color change, rash, hair loss, redness, skin tightness, ulcers and sensitivity to sunlight.  Allergic/Immunologic: Negative for susceptible to infections.  Neurological: Positive for dizziness, numbness, headaches and memory loss. Negative for night sweats and weakness.  Hematological: Negative for bruising/bleeding tendency and swollen glands.  Psychiatric/Behavioral: Positive for sleep disturbance. Negative for depressed mood and confusion. The patient is not nervous/anxious.     PMFS History:  Patient Active Problem List   Diagnosis Date Noted  . Sprain of ankle 07/18/2020  . Acute hypoxemic respiratory failure due to COVID-19 ([place]) 06/11/2020  . Hx of migraine headaches 04/09/2020  . Postpartum care following cesarean delivery 11/22/2019  . Contraceptive management 11/22/2019  . Positive RPR test 10/09/2019  . HTN (hypertension) 09/23/2019  . Migraine headache 07/12/2019  . Alpha thalassemia trait   . Vitamin D deficiency 09/16/2017  . Prediabetes 09/16/2017  . Depression 09/16/2017  . Class 3 severe obesity with serious comorbidity and body mass index (BMI) of 50.0 to 59.9  in adult [facility]) 09/16/2017  . PCOS (polycystic ovarian syndrome) 08/13/2017  . Mass 05/06/2017  . Vertigo 10/06/2016  . Microcytic anemia 09/26/2016  . Pseudotumor cerebri syndrome 08/28/2016  .  Obesity hypoventilation syndrome ([place]) 08/28/2016  . Benign paroxysmal positional vertigo due to bilateral vestibular disorder 08/28/2016  . OSA (obstructive sleep apnea) 08/28/2016  . Hypersomnia with sleep apnea 08/28/2016  . Female hirsutism 08/28/2016  . GERD (gastroesophageal reflux disease) 02/24/2015  . Constipation 10/02/2014    Past Medical History:  Diagnosis Date  . Abnormal uterine bleeding   . Acid reflux   . Alpha thalassemia trait   . Amenorrhea   . Anemia    no current med.  . Back pain   . Cesarean delivery delivered 10/11/2019   10/09/2019 - primary CS for failed IOL  . Chest pain   . Complication of anesthesia    states had to keep giving her anesthesia during EGD  . Constipation   . Depression    "I'm good"  . Finger mass, left 03/2017   middle finger  . Gestational diabetes   . Gestational diabetes 05/23/2019   Current Diabetic Medications:  None  [ ] Aspirin 81 mg daily after 12 weeks (? A2/B GDM)  Required Referrals for A1GDM or A2GDM: [ ] Diabetes Education and Testing Supplies [ ] Nutrition Cousult  For A2/B GDM or higher classes of DM [ ] Diabetes Education and Equities trader [ ] Nutrition Counsult [ ] Fetal ECHO after 20 weeks  [ ] Eye exam for retina evaluation   Baseline and surveillance labs (  . Gestational hypertension 09/23/2019   Guidelines for Antenatal Testing and Sonography  (with updated ICD-10 codes)  Updated  10/06/19 with Dr. Tama High  INDICATION U/S 2 X week NST/AFI  or full BPP wkly DELIVERY Diabetes   A1 - good control - O24.410    A2 - good control - O24.419      A2  - poor control or poor compliance - O24.419, E11.65   (Macrosomia or polyhydramnios) **E11.65 is extra code for poor control**    A2/B - O24.  Marland Kitchen Headache   . Hypertension   . Infection    UTI  . Irritable bowel syndrome (IBS)   . Joint pain   . Morbid obesity with body mass index (BMI) of 45.0 to 49.9 in adult [facility])   . Obesity during pregnancy, antepartum 04/06/2019    Body mass index is 53.71 kg/m.  Recommendations [x] Aspirin 81 mg daily after 12 weeks; discontinue after 36 weeks [ ] Nutrition consult [ ] Weight gain 11-20 lbs for singleton and 25-35 lbs for twin pregnancy (IOM guidelines) Higher class of obesity patients recommended to gain closer to lower limit  Weight loss is associated with adverse outcomes [ ] Baseline and surveillance labs (pulled in fr  . Plantar fasciitis, bilateral   . Polycystic ovary syndrome   . Prediabetes    "prediabetes"  . Pregnancy induced hypertension   . Shortness of breath   . Sleep apnea    no CPAP use  . Supervision of normal first pregnancy 04/06/2019   [name]: [ x]Initial [ x]12 [ ]20 [ ]28 [ ]32 [ ]36 [ ]38 [ ]39 [ ]40 Nursing Staff Provider Office Location [facility]  Dating   LMP Language  English  Anatomy US  Nml Flu Vaccine  Declined 07/28/19 Genetic Screen  NIPS: low risks   AFP:   negative  TDaP vaccine   08/25/19  Hgb A1C or  GTT Early A1C 5.8 Third trimester: GDM insulin Rhogam  NA   LAB RESULTS  Feeding Plan Breast Blood Type  . Vertigo 2017    Family History  Problem Relation Age of Onset  . Diabetes Maternal Aunt   . Hypertension Maternal Uncle   . Depression Maternal Grandfather   . Diabetes Maternal Grandfather   . Hypertension Maternal Grandfather   . Arthritis Mother   . High blood pressure Mother   . Depression Mother   . Sleep apnea Mother   . Obesity Mother   . Diabetes Mother   . Hypertension Mother   . Kidney failure Mother   . Sleep apnea Father   . Obesity Father   . Healthy Daughter    Past Surgical History:  Procedure Laterality Date  . CESAREAN SECTION N/A 10/09/2019   Procedure: CESAREAN SECTION;  Surgeon: Aletha Halim, MD;  Location: [facility];  Service: Obstetrics;  Laterality: N/A;  . COLONOSCOPY WITH PROPOFOL  10/07/2016  . ESOPHAGOGASTRODUODENOSCOPY  12/21/2015  . EXCISION MASS UPPER EXTREMETIES Left 04/21/2017   Procedure: EXCISION MASS LEFT MIDDLE  FINGER;  Surgeon: Daryll Brod, MD;  Location: [place];  Service: Orthopedics;  Laterality: Left;  . IR FL GUIDED LOC OF NEEDLE/CATH TIP FOR SPINAL INJECTION RT  03/13/2020   Social History   Social History Narrative   Right handed    Soda sometimes   Lives with mom and cousin, a grandmother in [place] also helps care for her.       She is in nursing school.    Immunization History  Administered Date(s) Administered  . HPV Quadrivalent 05/20/2012, 07/22/2012, 08/23/2013  . Influenza,inj,Quad PF,6+ Mos 08/23/2013, 07/18/2014, 08/25/2019  . Tdap 07/12/2009, 08/25/2019     Objective: Vital Signs: BP (!) 153/85 (BP Location: Right Wrist, Patient Position: Sitting, Cuff Size: Normal)   Pulse 89   Resp 18   Ht 6' 2.5" (1.892 m)   Wt (!) 452 lb (205 kg)   BMI 57.26 kg/m    Physical Exam Vitals and nursing note reviewed.  Constitutional:      Appearance: She is well-developed.  HENT:     Head: Normocephalic and atraumatic.  Eyes:     Conjunctiva/sclera: Conjunctivae normal.  Cardiovascular:     Rate and Rhythm: Normal rate and regular rhythm.     Heart sounds: Normal heart sounds.  Pulmonary:     Effort: Pulmonary effort is normal.     Breath sounds: Normal breath sounds.  Abdominal:     General: Bowel sounds are normal.     Palpations: Abdomen is soft.  Musculoskeletal:     Cervical back: Normal range of motion.  Lymphadenopathy:     Cervical: No cervical adenopathy.  Skin:    General: Skin is warm and dry.     Capillary Refill: Capillary refill takes less than 2 seconds.  Neurological:     Mental Status: She is alert and oriented to person, place, and time.  Psychiatric:        Behavior: Behavior normal.      Musculoskeletal Exam: C-spine was in good range of motion.  Thoracic and lumbar range of motion was difficult to assess due to body habitus.  Shoulder joints, elbow joints, wrist joints, MCPs PIPs and DIPs with good range of motion with no  synovitis.  She has some tenderness over the palmar aspect of her right hand.  Hip joints, knee joints, ankles, MTPs and PIPs with good  range of motion with no synovitis.  She had recent right ankle sprain and she was wearing a brace.  CDAI Exam: CDAI Score: -- Patient Global: --; Provider Global: -- Swollen: --; Tender: -- Joint Exam 07/23/2020   No joint exam has been documented for this visit   There is currently no information documented on the homunculus. Go to the Rheumatology activity and complete the homunculus joint exam.  Investigation: No additional findings.  Imaging: DG Chest 2 View  Result Date: 07/20/2020 CLINICAL DATA:  Post COVID pneumonia, follow-up, BILATERAL chest pain EXAM: CHEST - 2 VIEW COMPARISON:  06/11/2020 FINDINGS: Upper normal heart size. Mediastinal contours and pulmonary vascularity normal. Lungs clear. No pulmonary infiltrate, pleural effusion, or pneumothorax. Osseous structures unremarkable. IMPRESSION: No acute abnormalities. Electronically Signed   By: Lavonia Dana M.D.   On: 07/20/2020 18:34   DG Ankle Complete Right  Result Date: 07/10/2020 CLINICAL DATA:  Pain EXAM: RIGHT ANKLE - COMPLETE 3+ VIEW COMPARISON:  None. FINDINGS: There is soft tissue swelling about the ankle without evidence for an acute displaced fracture or dislocation. There are no significant degenerative changes. IMPRESSION: Soft tissue swelling without evidence for an acute displaced fracture or dislocation. Electronically Signed   By: Constance Holster M.D.   On: 07/10/2020 19:15   XR Hand 2 View Left  Result Date: 07/23/2020 No MCP, PIP, DIP, intercarpal or radiocarpal joint space narrowing noted.  No erosive changes were noted. Impression: Unremarkable x-ray of the hand.  XR Hand 2 View Right  Result Date: 07/23/2020 No MCP, PIP, DIP, intercarpal or radiocarpal joint space narrowing noted.  No erosive changes were noted. Impression: Unremarkable x-ray of the hand.   Recent  Labs: Lab Results  Component Value Date   WBC 7.8 06/26/2020   HGB 12.6 06/26/2020   PLT 296 06/26/2020   NA 141 06/26/2020   K 4.1 06/26/2020   CL 109 06/26/2020   CO2 23 06/26/2020   GLUCOSE 102 (H) 06/26/2020   BUN 12 06/26/2020   CREATININE 0.71 06/26/2020   BILITOT 0.9 06/26/2020   ALKPHOS 52 06/15/2020   AST 15 06/26/2020   ALT 19 06/26/2020   PROT 7.0 06/26/2020   ALBUMIN 3.4 (L) 06/15/2020   CALCIUM 10.0 06/26/2020   GFRAA 141 06/26/2020    Speciality Comments: No specialty comments available.  Procedures:  No procedures performed Allergies: Bee pollen, Bee venom, Hydrocodone-acetaminophen, Peach flavor, Pollen extract, and Remdesivir   Assessment / Plan:     Visit Diagnoses: Positive ANA (antinuclear antibody) - 02/21/20: ANA 1:40NS, patient has very low titer ANA which is not significant.  She has no clinical features of lupus.  She denies any oral ulcers, nasal ulcers, malar rash, sicca symptoms, Raynaud's phenomenon, photosensitivity, joint swelling or lymphadenopathy.  Myalgia-CK 127, she complains of generalized muscle pain.  Her CK is normal.  She had no muscular weakness on examination.  It is possible that she may have some underlying myofascial pain due to chronic insomnia.  Pain in both hands -she complains of pain in her bilateral hands which she describes over the palmar region.  I do not see any synovitis on my examination.  To complete the work-up I will obtain following x-rays and labs today.  Plan: XR Hand 2 View Right, XR Hand 2 View Left, x-ray of bilateral hands were unremarkable.  Rheumatoid factor, Cyclic citrul peptide antibody, IgG  Elevated sed rate -on Feb 21, 2020 ESR 126, CRP 2.3 , I am uncertain about the etiology of  elevated sedimentation rate.  Will obtain following labs today.  Plan: Sedimentation rate, Serum protein electrophoresis with reflex  Essential hypertension-her blood pressures are still elevated.  Other medical problems are  listed as follows:  Prediabetes  History of gastroesophageal reflux (GERD)  Hx of migraine headaches  Benign paroxysmal positional vertigo due to bilateral vestibular disorder  Pseudotumor cerebri syndrome - Followed by Dr. Brett Fairy  OSA (obstructive sleep apnea)  Hypersomnia with sleep apnea  Obesity hypoventilation syndrome ([place])  Acute hypoxemic respiratory failure due to COVID-19 [facility]) - 05/2020  Vitamin D deficiency  Microcytic anemia - Followed by hematology.  Alpha thalassemia trait  PCOS (polycystic ovarian syndrome) - Followed by Dr[facility] Mancel Bale at [facility]  Female hirsutism   Orders: Orders Placed This Encounter  Procedures  . XR Hand 2 View Right  . XR Hand 2 View Left  . Sedimentation rate  . Rheumatoid factor  . Cyclic citrul peptide antibody, IgG  . Serum protein electrophoresis with reflex   No orders of the defined types were placed in this encounter.    Follow-Up Instructions: Return for Elevated sed rate, myalgias.   Bo Merino, MD  Note - This record has been created using Editor, commissioning.  Chart creation errors have been sought, but may not always  have been located. Such creation errors do not reflect on  the standard of medical care.

## 2020-07-26 ENCOUNTER — Inpatient Hospital Stay: Payer: BC Managed Care – PPO | Admitting: Adult Health

## 2020-08-01 ENCOUNTER — Ambulatory Visit: Payer: Medicaid Other | Admitting: Family Medicine

## 2020-08-02 ENCOUNTER — Ambulatory Visit: Payer: BC Managed Care – PPO | Attending: Adult Health | Admitting: Physical Therapy

## 2020-08-02 ENCOUNTER — Encounter: Payer: Self-pay | Admitting: Physical Therapy

## 2020-08-02 ENCOUNTER — Other Ambulatory Visit: Payer: Self-pay

## 2020-08-02 DIAGNOSIS — R262 Difficulty in walking, not elsewhere classified: Secondary | ICD-10-CM | POA: Insufficient documentation

## 2020-08-02 DIAGNOSIS — R293 Abnormal posture: Secondary | ICD-10-CM | POA: Insufficient documentation

## 2020-08-02 DIAGNOSIS — R2681 Unsteadiness on feet: Secondary | ICD-10-CM | POA: Diagnosis present

## 2020-08-02 DIAGNOSIS — H8111 Benign paroxysmal vertigo, right ear: Secondary | ICD-10-CM

## 2020-08-02 DIAGNOSIS — R42 Dizziness and giddiness: Secondary | ICD-10-CM

## 2020-08-02 DIAGNOSIS — M542 Cervicalgia: Secondary | ICD-10-CM | POA: Insufficient documentation

## 2020-08-02 NOTE — Therapy (Signed)
[facility] [address] [place], [place], [address] Phone: [phone]   Fax:  [phone]  Physical Therapy Evaluation  Patient Details  Name: Maureen Lewis MRN: [id number] Date of Birth: 09-13-1996 Referring Provider (PT): Gregor Hams, MD   Encounter Date: 08/02/2020   PT End of Session - 08/02/20 1648    Visit Number 1   vestibular   Number of Visits 6   vestibular   Date for PT Re-Evaluation 09/16/20   vestibular   Authorization Type Medicaid [employer]    Authorization Time Period requesting 6 visits, 1x/week x 6  (has used eval + 10 visits at [facility])    PT Start Time [phone]    PT Stop Time 0935    PT Time Calculation (min) 41 min    Activity Tolerance Patient tolerated treatment well    Behavior During Therapy [facility] for tasks assessed/performed           Past Medical History:  Diagnosis Date  . Abnormal uterine bleeding   . Acid reflux   . Alpha thalassemia trait   . Amenorrhea   . Anemia    no current med.  . Back pain   . Cesarean delivery delivered 10/11/2019   10/09/2019 - primary CS for failed IOL  . Chest pain   . Complication of anesthesia    states had to keep giving her anesthesia during EGD  . Constipation   . Depression    "I'm good"  . Finger mass, left 03/2017   middle finger  . Gestational diabetes   . Gestational diabetes 05/23/2019   Current Diabetic Medications:  None  [ ]  Aspirin 81 mg daily after 12 weeks (? A2/B GDM)  Required Referrals for A1GDM or A2GDM: [ ]  Diabetes Education and Testing Supplies [ ]  Nutrition Cousult  For A2/B GDM or higher classes of DM [ ]  Diabetes Education and Testing Supplies [ ]  Nutrition Counsult [ ]  Fetal ECHO after 20 weeks  [ ]  Eye exam for retina evaluation   Baseline and surveillance labs (  . Gestational hypertension 09/23/2019   Guidelines for Antenatal Testing and Sonography  (with updated ICD-10 codes)  Updated  09/09/19  with Dr. Tama High  INDICATION U/S 2 X week NST/AFI  or full BPP wkly DELIVERY Diabetes   A1 - good control - O24.410    A2 - good control - O24.419      A2  - poor control or poor compliance - O24.419, E11.65   (Macrosomia or polyhydramnios) **E11.65 is extra code for poor control**    A2/B - O24.  Marland Kitchen Headache   . Hypertension   . Infection    UTI  . Irritable bowel syndrome (IBS)   . Joint pain   . Morbid obesity with body mass index (BMI) of 45.0 to 49.9 in adult [facility])   . Obesity during pregnancy, antepartum 04/06/2019   Body mass index is 53.71 kg/m.  Recommendations [x]  Aspirin 81 mg daily after 12 weeks; discontinue after 36 weeks [ ]  Nutrition consult [ ]  Weight gain 11-20 lbs for singleton and 25-35 lbs for twin pregnancy (IOM guidelines) Higher class of obesity patients recommended to gain closer to lower limit  Weight loss is associated with adverse outcomes [ ]  Baseline and surveillance labs (pulled in fr  . Plantar fasciitis, bilateral   . Polycystic ovary syndrome   . Prediabetes    "prediabetes"  . Pregnancy induced hypertension   .  Shortness of breath   . Sleep apnea    no CPAP use  . Supervision of normal first pregnancy 04/06/2019   [name]: [ x]Initial [ x]12 [ ] 20 [ ] 28 [ ] 32 [ ] 36 [ ] 38 [ ] 39 [ ] 40 Nursing Staff Provider Office Location [facility]  Dating   LMP Language  English  Anatomy US  Nml Flu Vaccine  Declined 07/28/19 Genetic Screen  NIPS: low risks   AFP:   negative  TDaP vaccine   08/25/19 Hgb A1C or  GTT Early A1C 5.8 Third trimester: GDM insulin Rhogam  NA   LAB RESULTS  Feeding Plan Breast Blood Type  . Vertigo 2017    Past Surgical History:  Procedure Laterality Date  . CESAREAN SECTION N/A 10/09/2019   Procedure: CESAREAN SECTION;  Surgeon: Aletha Halim, MD;  Location: [facility] LD [facility]S;  Service: Obstetrics;  Laterality: N/A;  . COLONOSCOPY WITH PROPOFOL  10/07/2016  . ESOPHAGOGASTRODUODENOSCOPY  12/21/2015  . EXCISION MASS UPPER EXTREMETIES  Left 04/21/2017   Procedure: EXCISION MASS LEFT MIDDLE FINGER;  Surgeon: Daryll Brod, MD;  Location: [facility];  Service: Orthopedics;  Laterality: Left;  . IR FL GUIDED LOC OF NEEDLE/CATH TIP FOR SPINAL INJECTION RT  03/13/2020    There were no vitals filed for this visit.    Subjective Assessment - 08/02/20 0857    Subjective Pt has been in therapy for neck and back pain and then had a fall down the stairs.  Now presenting with dizziness, HA became more intense, blurred vision, changes in sleep, tinnitus, changes in memory, tingling in fingers since the fall.  Tingling is intermittent but persists all day.  After the fall she went to doctor who sent her for imaging.    Pertinent History acid reflux, alpha thalassemia trait, anemia, back pain, depression, gestational diabetes, HA, HTN, UTI, IBS, obesity, bilat plantar fasciitis, OSA, vertigo    Limitations Walking;Standing;House hold activities    Diagnostic tests 02/21/2020 x-ray IMPRESSION:Negative cervical spine radiographs. head CT scan 03/09/2020 IMPRESSION:  Normal CT scan of the head without contrast    Patient Stated Goals To resolve the dizziness so I can get back to normal life.    Currently in Pain? Yes              [facility] PT Assessment - 08/02/20 0904      Assessment   Medical Diagnosis Dizziness, Post-concussive    Referring Provider (PT) Gregor Hams, MD    Onset Date/Surgical Date 07/20/20    Prior Therapy yes - ortho for neck/back pain      Precautions   Precautions Other (comment)    Precaution Comments acid reflux, alpha thalassemia trait, anemia, back pain, depression, gestational diabetes, HA, HTN, UTI, IBS, obesity, bilat plantar fasciitis, OSA, vertigo      Restrictions   Weight Bearing Restrictions No      Balance Screen   Has the patient fallen in the past 6 months Yes    How many times? [place] Parent;Children      Available Help at Discharge Family    Type of [place] to enter    Entrance Stairs-Number of Steps 6    Entrance Stairs-Rails Can reach both    [place] One level    Additional Comments Is not driving right now, depending on people for rides due to  dizziness      Prior Function   Level of Independence Independent    Vocation Full time employment    Vocation Requirements prolong standing, lifting / standing/ walking      Cognition   Overall Cognitive Status Impaired/Different from baseline   pt reports difficulty with short term memory     Observation/Other Assessments   Focus on Therapeutic Outcomes (FOTO)  N/A - Medicaid      Sensation   Light Touch Impaired by gross assessment    Additional Comments Pt reports tingling in fingers; normal and intact to light touch                  Vestibular Assessment - 08/02/20 0908      Vestibular Assessment   General Observation Pt ambulates independently into clinic; no dizziness at baseline/rest      Symptom Behavior   Type of Dizziness  Imbalance;Lightheadedness;Blurred vision   reports intermittent room spinning dizziness   Frequency of Dizziness intermittent    Duration of Dizziness varies, a few minutes > all day    Symptom Nature Motion provoked;Positional    Aggravating Factors Lying supine;Supine to sit;Sit to stand;Driving;Turning head quickly    Relieving Factors Dark room    Progression of Symptoms Worse      Oculomotor Exam   Ocular ROM WFL    Spontaneous Absent    Gaze-induced  Absent    Smooth Pursuits Intact   dizziness   Saccades Intact   dizziness   Comment convergence impaired but pt wears glasses for near vision.  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      Oculomotor Exam-Fixation Suppressed    Left Head Impulse negative   dizziness   Right Head Impulse negative   dizziness     Vestibulo-Ocular Reflex   VOR to Slow Head Movement Normal    VOR Cancellation Normal       Positional Testing   Dix-Hallpike Dix-Hallpike Right;Dix-Hallpike Left    Horizontal Canal Testing Horizontal Canal Right;Horizontal Canal Left      Dix-Hallpike Right   Dix-Hallpike Right Duration >60 seconds     Dix-Hallpike Right Symptoms No nystagmus      Dix-Hallpike Left   Dix-Hallpike Left Duration 0    Dix-Hallpike Left Symptoms No nystagmus      Horizontal Canal Right   Horizontal Canal Right Duration 0    Horizontal Canal Right Symptoms Normal      Horizontal Canal Left   Horizontal Canal Left Duration 0    Horizontal Canal Left Symptoms Normal              Objective measurements completed on examination: See above findings.        Vestibular Treatment/Exercise - 08/02/20 0931      Vestibular Treatment/Exercise   Vestibular Treatment Provided Canalith Repositioning    Canalith Repositioning Epley Manuever Right       EPLEY MANUEVER RIGHT   Number of Reps  1    Overall Response No change                 PT Education - 08/02/20 1647    Education Details clinical findings, vestibular rehab POC and goals, remaining # of visits    Person(s) Educated Patient    Methods Explanation    Comprehension Verbalized understanding            PT Short Term Goals - 08/03/20 1111      PT SHORT TERM GOAL #3  Title VESTIBULAR GOALS - Pt will complete vestibular assessment with MSQ, DVA and FGA.    Baseline Not assessed to date    Time 3    Period Weeks    Status New    Target Date 08/24/20      PT SHORT TERM GOAL #4   Title Pt will demonstrate negative positional testing (roll test and dix-hallpike) for R and L sides to indicate resolution of BPPV.    Baseline Vertigo with R dix-hallpike    Time 3    Period Weeks    Status New    Target Date 08/24/20      PT SHORT TERM GOAL #5   Title Pt will demonstrate independence with initial vestibular/balance HEP    Baseline no HEP to date    Period Weeks    Status New    Target Date 08/24/20              PT Long Term Goals - 08/03/20 1114      PT LONG TERM GOAL #1   Title VESTIBULAR GOALS - Pt will demonstrate independence with final vestibular and balance HEP    Baseline no HEP to date    Time 6    Period Weeks    Status New    Target Date 09/17/20      PT LONG TERM GOAL #2   Title Pt will report 0/5 (no dizziness) on all movements of MSQ to indicate decreased motion sensitivity    Baseline not assessed to date    Time 6    Period Weeks    Status New    Target Date 09/17/20      PT LONG TERM GOAL #3   Title Pt will demonstrate 1-2 line difference on DVA to indicate improved gaze stability    Baseline not assessed to date    Time 6    Period Weeks    Status New    Target Date 09/17/20      PT LONG TERM GOAL #4   Title Pt will demonstrate 4 point improvement in FGA to indicate decreased falls risk    Baseline not assessed to date    Time 6    Period Weeks    Status New    Target Date 09/17/20      PT LONG TERM GOAL #5   Title 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  Pt's PMH is significant for the following: acid reflux, alpha thalassemia trait, anemia, back pain, depression, gestational diabetes, HA, HTN, UTI, IBS, obesity, bilat plantar fasciitis, OSA, vertigo. The following deficits were noted during pt's exam: disequilibrium and vertigo, visual motion sensitivity, impaired vision and ocular alignment seen with cover/uncover test, post-concussive HA, impaired balance and dizziness with walking placing  patient at increased risk for falls. Pt would benefit from skilled PT to address these impairments and functional limitations to maximize functional mobility independence and reduce  falls risk.    Personal Factors and Comorbidities Age;Comorbidity 3+;Finances;Fitness;Profession;Time since onset of injury/illness/exacerbation;Transportation    Comorbidities acid reflux, alpha thalassemia trait, anemia, back pain, depression, gestational diabetes, HA, HTN, UTI, IBS, obesity, bilat plantar fasciitis, OSA, vertigo    Examination-Activity Limitations Bed Mobility;Bend;Locomotion Level;Reach Overhead;Stairs    Examination-Participation Restrictions Community Activity;Driving;Occupation    Stability/Clinical Decision Making Evolving/Moderate complexity    Clinical Decision Making Moderate    Rehab Potential Good    PT Frequency 1x / week    PT Duration 6 weeks    PT Treatment/Interventions ADLs/Self Care Home Management;Canalith Repostioning;Gait training;Stair training;Functional mobility training;Therapeutic activities;Therapeutic exercise;Balance training;Neuromuscular re-education;Patient/family education;Vestibular;Visual/perceptual remediation/compensation    PT Next Visit Plan Re-check R BPPV - did not have nystagmus but had symptoms of vertigo on R side?  Finish vestibular evaluation and initiate vestibular HEP - orthostatics, DVA, MSQ, FGA.    Recommended Other Services ophthalmology due to exophoria and visual disturbance    Consulted and Agree with Plan of Care Patient           Patient will benefit from skilled therapeutic intervention in order to improve the following deficits and impairments:  Decreased balance, Difficulty walking, Dizziness, Impaired vision/preception, Pain  Visit Diagnosis: Dizziness and giddiness  BPPV (benign paroxysmal positional vertigo), right  Unsteadiness on feet  Difficulty in walking, not elsewhere classified     Problem List Patient Active Problem  List   Diagnosis Date Noted  . Sprain of ankle 07/18/2020  . Acute hypoxemic respiratory failure due to COVID-19 ([place]) 06/11/2020  . Hx of migraine headaches 04/09/2020  . Postpartum care following cesarean delivery 11/22/2019  . Contraceptive management 11/22/2019  . Positive RPR test 10/09/2019  . HTN (hypertension) 09/23/2019  . Migraine headache 07/12/2019  . Alpha thalassemia trait   . Vitamin D deficiency 09/16/2017  . Prediabetes 09/16/2017  . Depression 09/16/2017  . Class 3 severe obesity with serious comorbidity and body mass index (BMI) of 50.0 to 59.9 in adult ([place]) 09/16/2017  . PCOS (polycystic ovarian syndrome) 08/13/2017  . Mass 05/06/2017  . Vertigo 10/06/2016  . Microcytic anemia 09/26/2016  . Pseudotumor cerebri syndrome 08/28/2016  . Obesity hypoventilation syndrome ([place]) 08/28/2016  . Benign paroxysmal positional vertigo due to bilateral vestibular disorder 08/28/2016  . OSA (obstructive sleep apnea) 08/28/2016  . Hypersomnia with sleep apnea 08/28/2016  . Female hirsutism 08/28/2016  . GERD (gastroesophageal reflux disease) 02/24/2015  . Constipation 10/02/2014    Rico Junker, PT, DPT 08/03/20    11:18 AM    [facility] [address] [place], [place], [address] Phone: [phone]   Fax:  [phone]  Name: Maureen Lewis MRN: [id number] Date of Birth: May 19, 1996

## 2020-08-03 ENCOUNTER — Ambulatory Visit: Payer: Self-pay

## 2020-08-03 ENCOUNTER — Encounter: Payer: Self-pay | Admitting: Family Medicine

## 2020-08-03 ENCOUNTER — Ambulatory Visit (INDEPENDENT_AMBULATORY_CARE_PROVIDER_SITE_OTHER): Payer: BC Managed Care – PPO | Admitting: Family Medicine

## 2020-08-03 VITALS — BP 140/80 | Ht 74.5 in | Wt >= 6400 oz

## 2020-08-03 DIAGNOSIS — H8113 Benign paroxysmal vertigo, bilateral: Secondary | ICD-10-CM

## 2020-08-03 DIAGNOSIS — S060X0D Concussion without loss of consciousness, subsequent encounter: Secondary | ICD-10-CM

## 2020-08-03 DIAGNOSIS — G932 Benign intracranial hypertension: Secondary | ICD-10-CM | POA: Diagnosis not present

## 2020-08-03 DIAGNOSIS — S93401S Sprain of unspecified ligament of right ankle, sequela: Secondary | ICD-10-CM

## 2020-08-03 MED ORDER — AMITRIPTYLINE HCL 25 MG PO TABS: 25.0000 mg | ORAL_TABLET | Freq: Every day | ORAL | 2 refills | Status: DC

## 2020-08-03 MED ORDER — ACETAZOLAMIDE ER 500 MG PO CP12
500.0000 mg | ORAL_CAPSULE | Freq: Two times a day (BID) | ORAL | 2 refills | Status: DC
Start: 2020-08-03 — End: 2020-08-16

## 2020-08-03 MED ORDER — ACETAZOLAMIDE ER 500 MG PO CP12: 500.0000 mg | ORAL_CAPSULE | Freq: Two times a day (BID) | ORAL | 2 refills | Status: DC

## 2020-08-03 NOTE — Addendum Note (Signed)
Addended by: Gregor Hams on: 08/03/2020 01:19 PM   Modules accepted: Orders

## 2020-08-03 NOTE — Progress Notes (Signed)
Subjective:    Chief Complaint: [name], LAT, ATC, am serving as scribe for Dr. [name].  Lourdes Kucharski Calderone,  is a 24 y.o. female who presents for f/u of concussion that she sustained after falling on the stairs on 07/10/20 and injuring her head and her ankle.  She was last seen by Dr. Georgina Snell on 07/20/20 w/ c/o HA and photophobia and was prescribed Nortriptyline.  She was referred to PT for vestibular rehab and has completed one visit.  Since her last visit, pt reports she feels slightly improved but con't to have issues w/ HA and photosensitivity.  She is doing better w/ sensitivity to noise but still has issues.  She is taking her Nortriptyline.  R ankle: She reports increased R ankle pain.  She was previously being seen by another provider for this who recommended that she come out of the boot and transition into an ankle brace.  She states that she tried the brace for about one week but then had to go back into the boot due to increased pain when trying to use the brace.  She locates her pain to her R lateral ankle that shoots across the ankle joint.  Injury date : 07/10/20 Visit #: 2    History of Present Illness:    Concussion Self-Reported Symptom Score Symptoms rated on a scale 1-6, in last 24 hours   Headache: 6   Nausea: 0  Dizziness: 5  Vomiting: 0  Balance Difficulty: 4   Trouble Falling Asleep: 6   Fatigue: 0  Sleep Less Than Usual: 6  Daytime Drowsiness: 6  Sleep More Than Usual: 0  Photophobia: 5  Phonophobia: 3  Irritability: 4  Sadness: 0  Numbness or Tingling: 6  Nervousness: 1  Feeling More Emotional: 0  Feeling Mentally Foggy: 4  Feeling Slowed Down: 4  Memory Problems: 5  Difficulty Concentrating: 4  Visual Problems: 0   Total # of Symptoms: 15/22 Total Symptom Score: 69/132 Previous Total # of Symptoms: 18/22 Previous Symptom Score: 81/132   Neck Pain: No  Tinnitus: Yes  Review of Systems: No fevers or chills    Review of  History: History pseudotumor cerebri headache disorder previously managed with Topamax 50 mg twice daily  Objective:    Physical Examination Vitals:   08/03/20 0817  BP: 140/80   MSK: C-spine normal-appearing nontender. Right ankle mildly tender palpation decreased motion. Neuro: Alert and oriented normal coordination Psych: Normal speech thought process and affect.   Assessment and Plan   24 y.o. female with concussion with pre-existing headache disorder and with pre-existing vertigo.  Obviously her pre-existing symptoms are worse with the concussion.  She is not improving much with time nortriptyline and Topamax.  She has nortriptyline no longer is helping her sleep.  Plan to switch nortriptyline to amitriptyline as this may be more effective and continue the Topamax.  Will also add acetazolamide as I do believe a lot of her headache is likely coming from her pseudotumor cerebri.  I will discuss with her neurologist as well.  Ankle pain: Sprain occurring about 5 weeks ago not improving much.  Still having to wear a cam walker boot to get around.  We will repeat x-ray today after the visit at an outside location.  If still not improving would plan for MRI ankle as she now is approaching 6 weeks of trial of conservative management with no significant improvement.   Recheck in 2 weeks    Action/Discussion: Reviewed  diagnosis, management options, expected outcomes, and the reasons for scheduled and emergent follow-up. Questions were adequately answered. Patient expressed verbal understanding and agreement with the following plan.     Patient Education:  Reviewed with patient the risks (i.e, a repeat concussion, post-concussion syndrome, second-impact syndrome) of returning to play prior to complete resolution, and thoroughly reviewed the signs and symptoms of concussion.Reviewed need for complete resolution of all symptoms, with rest AND exertion, prior to return to play.  Reviewed red  flags for urgent medical evaluation: worsening symptoms, nausea/vomiting, intractable headache, musculoskeletal changes, focal neurological deficits.  [facility] Concussion Care Plan, which clearly outlines the plans stated above, was given to patient.   In addition to the time spent performing tests, I spent 30 min   Reviewed with patient the risks (i.e, a repeat concussion, post-concussion syndrome, second-impact syndrome) of returning to play prior to complete resolution, and thoroughly reviewed the signs and symptoms of      concussion. Reviewedf need for complete resolution of all symptoms, with rest AND exertion, prior to return to play.  Reviewed red flags for urgent medical evaluation: worsening symptoms, nausea/vomiting, intractable headache, musculoskeletal changes, focal neurological deficits.  [facility] Concussion Care Plan, which clearly outlines the plans stated above, was given to patient   After Visit Summary printed out and provided to patient as appropriate.  The above documentation has been reviewed and is accurate and complete [name]

## 2020-08-03 NOTE — Patient Instructions (Signed)
Thank you for coming in today.  Please get an Xray today before you leave  STOP nortryptline.  START amitryptline at bedtime for sleep and headache.  CONTINUE topamax ADD Acetazolamide twice daily for headache.   Continue PT.   Recheck in 2 weeks.

## 2020-08-04 NOTE — Progress Notes (Deleted)
Office Visit Note  Patient: Maureen Lewis             Date of Birth: 1996/08/03           MRN: [id number]             PCP: Dorothyann Peng, NP Referring: Dorothyann Peng, NP Visit Date: 08/14/2020 Occupation: @GUAROCC @  Subjective:  No chief complaint on file.   History of Present Illness: Maureen Lewis is a 24 y.o. female ***   Activities of Daily Living:  Patient reports morning stiffness for *** {minute/hour:19697}.   Patient {ACTIONS;DENIES/REPORTS:21021675::"Denies"} nocturnal pain.  Difficulty dressing/grooming: {ACTIONS;DENIES/REPORTS:21021675::"Denies"} Difficulty climbing stairs: {ACTIONS;DENIES/REPORTS:21021675::"Denies"} Difficulty getting out of chair: {ACTIONS;DENIES/REPORTS:21021675::"Denies"} Difficulty using hands for taps, buttons, cutlery, and/or writing: {ACTIONS;DENIES/REPORTS:21021675::"Denies"}  No Rheumatology ROS completed.   PMFS History:  Patient Active Problem List   Diagnosis Date Noted  . Sprain of ankle 07/18/2020  . Acute hypoxemic respiratory failure due to COVID-19 ([place]) 06/11/2020  . Hx of migraine headaches 04/09/2020  . Postpartum care following cesarean delivery 11/22/2019  . Contraceptive management 11/22/2019  . Positive RPR test 10/09/2019  . HTN (hypertension) 09/23/2019  . Migraine headache 07/12/2019  . Alpha thalassemia trait   . Vitamin D deficiency 09/16/2017  . Prediabetes 09/16/2017  . Depression 09/16/2017  . Class 3 severe obesity with serious comorbidity and body mass index (BMI) of 50.0 to 59.9 in adult ([place]) 09/16/2017  . PCOS (polycystic ovarian syndrome) 08/13/2017  . Mass 05/06/2017  . Vertigo 10/06/2016  . Microcytic anemia 09/26/2016  . Pseudotumor cerebri syndrome 08/28/2016  . Obesity hypoventilation syndrome ([place]) 08/28/2016  . Benign paroxysmal positional vertigo due to bilateral vestibular disorder 08/28/2016  . OSA (obstructive sleep apnea) 08/28/2016  . Hypersomnia with sleep  apnea 08/28/2016  . Female hirsutism 08/28/2016  . GERD (gastroesophageal reflux disease) 02/24/2015  . Constipation 10/02/2014    Past Medical History:  Diagnosis Date  . Abnormal uterine bleeding   . Acid reflux   . Alpha thalassemia trait   . Amenorrhea   . Anemia    no current med.  . Back pain   . Cesarean delivery delivered 10/11/2019   10/09/2019 - primary CS for failed IOL  . Chest pain   . Complication of anesthesia    states had to keep giving her anesthesia during EGD  . Constipation   . Depression    "I'm good"  . Finger mass, left 03/2017   middle finger  . Gestational diabetes   . Gestational diabetes 05/23/2019   Current Diabetic Medications:  None  [ ]  Aspirin 81 mg daily after 12 weeks (? A2/B GDM)  Required Referrals for A1GDM or A2GDM: [ ]  Diabetes Education and Testing Supplies [ ]  Nutrition Cousult  For A2/B GDM or higher classes of DM [ ]  Diabetes Education and Testing Supplies [ ]  Nutrition Counsult [ ]  Fetal ECHO after 20 weeks  [ ]  Eye exam for retina evaluation   Baseline and surveillance labs (  . Gestational hypertension 09/23/2019   Guidelines for Antenatal Testing and Sonography  (with updated ICD-10 codes)  Updated  09-23-2019 with Dr. Tama High  INDICATION U/S 2 X week NST/AFI  or full BPP wkly DELIVERY Diabetes   A1 - good control - O24.410    A2 - good control - O24.419      A2  - poor control or poor compliance - O24.419, E11.65   (Macrosomia or polyhydramnios) **E11.65 is extra code for poor control**  A2/B - O24.  Marland Kitchen Headache   . Hypertension   . Infection    UTI  . Irritable bowel syndrome (IBS)   . Joint pain   . Morbid obesity with body mass index (BMI) of 45.0 to 49.9 in adult [facility])   . Obesity during pregnancy, antepartum 04/06/2019   Body mass index is 53.71 kg/m.  Recommendations [x]  Aspirin 81 mg daily after 12 weeks; discontinue after 36 weeks [ ]  Nutrition consult [ ]  Weight gain 11-20 lbs for singleton and 25-35 lbs for twin  pregnancy (IOM guidelines) Higher class of obesity patients recommended to gain closer to lower limit  Weight loss is associated with adverse outcomes [ ]  Baseline and surveillance labs (pulled in fr  . Plantar fasciitis, bilateral   . Polycystic ovary syndrome   . Prediabetes    "prediabetes"  . Pregnancy induced hypertension   . Shortness of breath   . Sleep apnea    no CPAP use  . Supervision of normal first pregnancy 04/06/2019   [name]: [ x]Initial [ x]12 [ ] 20 [ ] 28 [ ] 32 [ ] 36 [ ] 38 [ ] 39 [ ] 40 Nursing Staff Provider Office Location [facility]  Dating   LMP Language  English  Anatomy US  Nml Flu Vaccine  Declined 07/28/19 Genetic Screen  NIPS: low risks   AFP:   negative  TDaP vaccine   08/25/19 Hgb A1C or  GTT Early A1C 5.8 Third trimester: GDM insulin Rhogam  NA   LAB RESULTS  Feeding Plan Breast Blood Type  . Vertigo 2017    Family History  Problem Relation Age of Onset  . Diabetes Maternal Aunt   . Hypertension Maternal Uncle   . Depression Maternal Grandfather   . Diabetes Maternal Grandfather   . Hypertension Maternal Grandfather   . Arthritis Mother   . High blood pressure Mother   . Depression Mother   . Sleep apnea Mother   . Obesity Mother   . Diabetes Mother   . Hypertension Mother   . Kidney failure Mother   . Sleep apnea Father   . Obesity Father   . Healthy Daughter    Past Surgical History:  Procedure Laterality Date  . CESAREAN SECTION N/A 10/09/2019   Procedure: CESAREAN SECTION;  Surgeon: Aletha Halim, MD;  [facility]: [facility];  Service: Obstetrics;  Laterality: N/A;  . COLONOSCOPY WITH PROPOFOL  10/07/2016  . ESOPHAGOGASTRODUODENOSCOPY  12/21/2015  . EXCISION MASS UPPER EXTREMETIES Left 04/21/2017   Procedure: EXCISION MASS LEFT MIDDLE FINGER;  Surgeon: Daryll Brod, MD;  Location: [facility];  Service: Orthopedics;  Laterality: Left;  . IR FL GUIDED LOC OF NEEDLE/CATH TIP FOR SPINAL INJECTION RT  03/13/2020   Social History    Social History Narrative   Right handed    Soda sometimes   Lives with mom and cousin, a grandmother in [place] also helps care for her.       She is in nursing school.    Immunization History  Administered Date(s) Administered  . HPV Quadrivalent 05/20/2012, 07/22/2012, 08/23/2013  . Influenza,inj,Quad PF,6+ Mos 08/23/2013, 07/18/2014, 08/25/2019  . Tdap 07/12/2009, 08/25/2019     Objective: Vital Signs: There were no vitals taken for this visit.   Physical Exam   Musculoskeletal Exam: ***  CDAI Exam: CDAI Score: -- Patient Global: --; Provider Global: -- Swollen: --; Tender: -- Joint Exam 08/14/2020   No joint exam has been documented for this visit   There is currently no  information documented on the homunculus. Go to the Rheumatology activity and complete the homunculus joint exam.  Investigation: No additional findings.  Imaging: DG Chest 2 View  Result Date: 07/20/2020 CLINICAL DATA:  Post COVID pneumonia, follow-up, BILATERAL chest pain EXAM: CHEST - 2 VIEW COMPARISON:  06/11/2020 FINDINGS: Upper normal heart size. Mediastinal contours and pulmonary vascularity normal. Lungs clear. No pulmonary infiltrate, pleural effusion, or pneumothorax. Osseous structures unremarkable. IMPRESSION: No acute abnormalities. Electronically Signed   By: Lavonia Dana M.D.   On: 07/20/2020 18:34   DG Ankle Complete Right  Result Date: 07/10/2020 CLINICAL DATA:  Pain EXAM: RIGHT ANKLE - COMPLETE 3+ VIEW COMPARISON:  None. FINDINGS: There is soft tissue swelling about the ankle without evidence for an acute displaced fracture or dislocation. There are no significant degenerative changes. IMPRESSION: Soft tissue swelling without evidence for an acute displaced fracture or dislocation. Electronically Signed   By: Constance Holster M.D.   On: 07/10/2020 19:15   [name] LIMITED JOINT SPACE STRUCTURES LOW RIGHT  Result Date: 07/24/2020 Limited MSK right ankle:  Peroneal tendons intact  with small amount of fluid in sheath at level of ankle.  No cortical irregularity of lateral malleolus.  XR Hand 2 View Left  Result Date: 07/23/2020 No MCP, PIP, DIP, intercarpal or radiocarpal joint space narrowing noted.  No erosive changes were noted. Impression: Unremarkable x-ray of the hand.  XR Hand 2 View Right  Result Date: 07/23/2020 No MCP, PIP, DIP, intercarpal or radiocarpal joint space narrowing noted.  No erosive changes were noted. Impression: Unremarkable x-ray of the hand.   Recent Labs: Lab Results  Component Value Date   WBC 7.8 06/26/2020   HGB 12.6 06/26/2020   PLT 296 06/26/2020   NA 141 06/26/2020   K 4.1 06/26/2020   CL 109 06/26/2020   CO2 23 06/26/2020   GLUCOSE 102 (H) 06/26/2020   BUN 12 06/26/2020   CREATININE 0.71 06/26/2020   BILITOT 0.9 06/26/2020   ALKPHOS 52 06/15/2020   AST 15 06/26/2020   ALT 19 06/26/2020   PROT 7.0 06/26/2020   ALBUMIN 3.4 (L) 06/15/2020   CALCIUM 10.0 06/26/2020   GFRAA 141 06/26/2020    Speciality Comments: No specialty comments available.  Procedures:  No procedures performed Allergies: Bee pollen, Bee venom, Hydrocodone-acetaminophen, Peach flavor, Pollen extract, and Remdesivir   Assessment / Plan:     Visit Diagnoses: No diagnosis found.  Orders: No orders of the defined types were placed in this encounter.  No orders of the defined types were placed in this encounter.   Face-to-face time spent with patient was *** minutes. Greater than 50% of time was spent in counseling and coordination of care.  Follow-Up Instructions: No follow-ups on file.   Bo Merino, MD  Note - This record has been created using Editor, commissioning.  Chart creation errors have been sought, but may not always  have been located. Such creation errors do not reflect on  the standard of medical care.

## 2020-08-06 ENCOUNTER — Ambulatory Visit: Payer: BC Managed Care – PPO

## 2020-08-06 ENCOUNTER — Other Ambulatory Visit: Payer: Self-pay

## 2020-08-06 DIAGNOSIS — R262 Difficulty in walking, not elsewhere classified: Secondary | ICD-10-CM

## 2020-08-06 DIAGNOSIS — R293 Abnormal posture: Secondary | ICD-10-CM

## 2020-08-06 DIAGNOSIS — M542 Cervicalgia: Secondary | ICD-10-CM

## 2020-08-06 DIAGNOSIS — H8111 Benign paroxysmal vertigo, right ear: Secondary | ICD-10-CM

## 2020-08-06 DIAGNOSIS — R42 Dizziness and giddiness: Secondary | ICD-10-CM | POA: Diagnosis not present

## 2020-08-06 DIAGNOSIS — R2681 Unsteadiness on feet: Secondary | ICD-10-CM

## 2020-08-06 NOTE — Patient Instructions (Signed)
Gaze Stabilization: Sitting ° ° ° °Keeping eyes on target on wall 3feet away, tilt head down 15-30° and move head side to side for 45 seconds. Repeat while moving head up and down for 45 seconds. °Do 2-3 sessions per day. °Copyright © [employer]. All rights reserved.  ° °

## 2020-08-06 NOTE — Therapy (Signed)
[facility] [address] [place], [place], [address] Phone: [phone]   Fax:  [phone]  Physical Therapy Treatment  Patient Details  Name: Maureen Lewis MRN: [id number] Date of Birth: 09/03/1996 Referring Provider (PT): Gregor Hams, MD   Encounter Date: 08/06/2020   PT End of Session - 08/06/20 1022    Visit Number 2    Number of Visits 6   vestibular   Date for PT Re-Evaluation 09/16/20   vestibular   Authorization Type Medicaid [employer]    Authorization Time Period requesting 6 visits, 1x/week x 6  (has used eval + 10 visits at [facility])    PT Start Time 1022   pt arriving late   PT Stop Time 1100    PT Time Calculation (min) 38 min    Activity Tolerance Patient tolerated treatment well    Behavior During Therapy [facility] for tasks assessed/performed           Past Medical History:  Diagnosis Date  . Abnormal uterine bleeding   . Acid reflux   . Alpha thalassemia trait   . Amenorrhea   . Anemia    no current med.  . Back pain   . Cesarean delivery delivered 10/11/2019   10/09/2019 - primary CS for failed IOL  . Chest pain   . Complication of anesthesia    states had to keep giving her anesthesia during EGD  . Constipation   . Depression    "I'm good"  . Finger mass, left 03/2017   middle finger  . Gestational diabetes   . Gestational diabetes 05/23/2019   Current Diabetic Medications:  None  [ ]  Aspirin 81 mg daily after 12 weeks (? A2/B GDM)  Required Referrals for A1GDM or A2GDM: [ ]  Diabetes Education and Testing Supplies [ ]  Nutrition Cousult  For A2/B GDM or higher classes of DM [ ]  Diabetes Education and Testing Supplies [ ]  Nutrition Counsult [ ]  Fetal ECHO after 20 weeks  [ ]  Eye exam for retina evaluation   Baseline and surveillance labs (  . Gestational hypertension 09/23/2019   Guidelines for Antenatal Testing and Sonography  (with updated ICD-10 codes)  Updated   09-16-19 with Dr. Tama High  INDICATION U/S 2 X week NST/AFI  or full BPP wkly DELIVERY Diabetes   A1 - good control - O24.410    A2 - good control - O24.419      A2  - poor control or poor compliance - O24.419, E11.65   (Macrosomia or polyhydramnios) **E11.65 is extra code for poor control**    A2/B - O24.  Marland Kitchen Headache   . Hypertension   . Infection    UTI  . Irritable bowel syndrome (IBS)   . Joint pain   . Morbid obesity with body mass index (BMI) of 45.0 to 49.9 in adult [facility])   . Obesity during pregnancy, antepartum 04/06/2019   Body mass index is 53.71 kg/m.  Recommendations [x]  Aspirin 81 mg daily after 12 weeks; discontinue after 36 weeks [ ]  Nutrition consult [ ]  Weight gain 11-20 lbs for singleton and 25-35 lbs for twin pregnancy (IOM guidelines) Higher class of obesity patients recommended to gain closer to lower limit  Weight loss is associated with adverse outcomes [ ]  Baseline and surveillance labs (pulled in fr  . Plantar fasciitis, bilateral   . Polycystic ovary syndrome   . Prediabetes    "prediabetes"  . Pregnancy induced hypertension   .  Shortness of breath   . Sleep apnea    no CPAP use  . Supervision of normal first pregnancy 04/06/2019   [name]: [ x]Initial [ x]12 [ ] 20 [ ] 28 [ ] 32 [ ] 36 [ ] 38 [ ] 39 [ ] 40 Nursing Staff Provider Office Location [facility]  Dating   LMP Language  English  Anatomy US  Nml Flu Vaccine  Declined 07/28/19 Genetic Screen  NIPS: low risks   AFP:   negative  TDaP vaccine   08/25/19 Hgb A1C or  GTT Early A1C 5.8 Third trimester: GDM insulin Rhogam  NA   LAB RESULTS  Feeding Plan Breast Blood Type  . Vertigo 2017    Past Surgical History:  Procedure Laterality Date  . CESAREAN SECTION N/A 10/09/2019   Procedure: CESAREAN SECTION;  Surgeon: Aletha Halim, MD;  [facility];  Service: Obstetrics;  Laterality: N/A;  . COLONOSCOPY WITH PROPOFOL  10/07/2016  . ESOPHAGOGASTRODUODENOSCOPY  12/21/2015  . EXCISION MASS UPPER  EXTREMETIES Left 04/21/2017   Procedure: EXCISION MASS LEFT MIDDLE FINGER;  Surgeon: Daryll Brod, MD;  Location: [facility];  Service: Orthopedics;  Laterality: Left;  . IR FL GUIDED LOC OF NEEDLE/CATH TIP FOR SPINAL INJECTION RT  03/13/2020    There were no vitals filed for this visit.   Subjective Assessment - 08/06/20 1023    Subjective Patient reports feeling same since last visit, does have some dizziness this morning. Reports visit with Dr. Georgina Snell went well. Patient reports is having imaging completed tomorrow on the R ankle. Is back into the boot on RLE for now.    Pertinent History acid reflux, alpha thalassemia trait, anemia, back pain, depression, gestational diabetes, HA, HTN, UTI, IBS, obesity, bilat plantar fasciitis, OSA, vertigo    Limitations Walking;Standing;House hold activities    Diagnostic tests 02/21/2020 x-ray IMPRESSION:Negative cervical spine radiographs. head CT scan 03/09/2020 IMPRESSION:  Normal CT scan of the head without contrast    Patient Stated Goals To resolve the dizziness so I can get back to normal life.    Currently in Pain? Yes    Pain Score 8     Pain Location Ankle    Pain Orientation Right    Pain Descriptors / Indicators Aching;Stabbing    Pain Type Chronic pain    Pain Onset More than a month ago    Pain Frequency Intermittent                   Vestibular Assessment - 08/06/20 0001      Vestibulo-Ocular Reflex   VOR 1 Head Only (x 1 viewing) self selected pace provoked increased dizziness and headache.       Positional Testing   Dix-Hallpike Dix-Hallpike Right      Dix-Hallpike Right   Dix-Hallpike Right Duration 45 seconds    Dix-Hallpike Right Symptoms No nystagmus      Positional Sensitivities   Sit to Supine Moderate dizziness    Supine to Left Side Mild dizziness    Supine to Right Side Moderate dizziness    Supine to Sitting Severe dizziness    Right Hallpike Mild dizziness    Up from Right Hallpike Moderate  dizziness    Up from Left Hallpike No dizziness    Nose to Right Knee Mild dizziness    Right Knee to Sitting Moderate dizziness    Nose to Left Knee No dizziness    Left Knee to Sitting No dizziness    Head Turning x 5 Moderate  dizziness   reports started after 3rd rep.   Head Nodding x 5 Moderate dizziness   also started after 3rd rep.    Pivot Right in Standing No dizziness    Pivot Left in Standing No dizziness    Rolling Right Severe dizziness    Rolling Left Moderate dizziness    Positional Sensitivities Comments With completion of MSQ patient reports increased headache, rate 10/10. With turning lights off and shuting door to avoid noise, headache come down to 6/10. Able to finish rest of MSQ, but intermittent rest breaks due to increased dizziness.                Vestibular Treatment/Exercise - 08/06/20 0001      Vestibular Treatment/Exercise   Vestibular Treatment Provided Canalith Repositioning    Canalith Repositioning Epley Manuever Right    Gaze Exercises X1 Viewing Horizontal       EPLEY MANUEVER RIGHT   Number of Reps  2    Overall Response Improved Symptoms    Response Details  patient reports less intense symptoms compared to prior, and shortened duration. Upon sitting after completion of epley, patient reports spinning sensation.       X1 Viewing Horizontal   Foot Position seated without back support    Reps 1    Comments x 45 seconds. increased dizziness/headache.                  PT Education - 08/06/20 1101    Education Details Gaze Stabilization x 1    Person(s) Educated Patient    Methods Explanation;Demonstration;Handout    Comprehension Verbalized understanding;Returned demonstration;Need further instruction            PT Short Term Goals - 08/03/20 1111      PT SHORT TERM GOAL #3   Title VESTIBULAR GOALS - Pt will complete vestibular assessment with MSQ, DVA and FGA.    Baseline Not assessed to date    Time 3    Period Weeks     Status New    Target Date 08/24/20      PT SHORT TERM GOAL #4   Title Pt will demonstrate negative positional testing (roll test and dix-hallpike) for R and L sides to indicate resolution of BPPV.    Baseline Vertigo with R dix-hallpike    Time 3    Period Weeks    Status New    Target Date 08/24/20      PT SHORT TERM GOAL #5   Title Pt will demonstrate independence with initial vestibular/balance HEP    Baseline no HEP to date    Period Weeks    Status New    Target Date 08/24/20             PT Long Term Goals - 08/03/20 1114      PT LONG TERM GOAL #1   Title VESTIBULAR GOALS - Pt will demonstrate independence with final vestibular and balance HEP    Baseline no HEP to date    Time 6    Period Weeks    Status New    Target Date 09/17/20      PT LONG TERM GOAL #2   Title Pt will report 0/5 (no dizziness) on all movements of MSQ to indicate decreased motion sensitivity    Baseline not assessed to date    Time 6    Period Weeks    Status New    Target Date 09/17/20  PT LONG TERM GOAL #3   Title Pt will demonstrate 1-2 line difference on DVA to indicate improved gaze stability    Baseline not assessed to date    Time 6    Period Weeks    Status New    Target Date 09/17/20      PT LONG TERM GOAL #4   Title Pt will demonstrate 4 point improvement in FGA to indicate decreased falls risk    Baseline not assessed to date    Time 6    Period Weeks    Status New    Target Date 09/17/20      PT LONG TERM GOAL #5   Title 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  Completed MSQ today with patient having increased sensitivty to positions increased headache/dizziness and requiring freq rest breaks with lights turned off to improve HA. initiate VOR x 1 today, educating on proper completion and not to increased symptoms >/= 4/10 with completion. will continue to progress toward goals.    Personal Factors and Comorbidities Age;Comorbidity 3+;Finances;Fitness;Profession;Time since onset of injury/illness/exacerbation;Transportation    Comorbidities acid reflux, alpha thalassemia trait, anemia, back pain, depression, gestational diabetes, HA, HTN, UTI, IBS, obesity, bilat plantar fasciitis, OSA, vertigo    Examination-Activity Limitations Bed Mobility;Bend;Locomotion Level;Reach Overhead;Stairs    Examination-Participation Restrictions Community Activity;Driving;Occupation    Stability/Clinical Decision Making Evolving/Moderate complexity    Rehab Potential Good    PT Frequency 1x / week    PT Duration 6 weeks    PT Treatment/Interventions ADLs/Self Care Home Management;Canalith Repostioning;Gait training;Stair training;Functional mobility training;Therapeutic activities;Therapeutic exercise;Balance training;Neuromuscular re-education;Patient/family education;Vestibular;Visual/perceptual remediation/compensation    PT Next Visit Plan Re-check R BPPV. continues to have no nystagmus but has symptoms of vertigo on R side. How was VOR x 1.  Finish vestibular evaluation - orthostatics, DVA,FGA.    Consulted and Agree with Plan of Care Patient           Patient will benefit from skilled therapeutic intervention in order to improve the following deficits and impairments:  Decreased balance, Difficulty walking, Dizziness, Impaired vision/preception, Pain  Visit Diagnosis: Dizziness and giddiness  BPPV (benign paroxysmal positional vertigo), right  Unsteadiness on feet  Difficulty in walking, not elsewhere classified  Cervicalgia  Abnormal  posture     Problem List Patient Active Problem List   Diagnosis Date Noted  . Sprain of ankle 07/18/2020  . Acute hypoxemic respiratory failure due to COVID-19 ([place]) 06/11/2020  . Hx of migraine headaches 04/09/2020  . Postpartum care following cesarean delivery 11/22/2019  . Contraceptive management 11/22/2019  . Positive RPR test 10/09/2019  . HTN (hypertension) 09/23/2019  . Migraine headache 07/12/2019  . Alpha thalassemia trait   . Vitamin D deficiency 09/16/2017  . Prediabetes 09/16/2017  . Depression 09/16/2017  . Class 3 severe obesity with serious comorbidity and body mass index (BMI) of 50.0 to 59.9 in adult ([place]) 09/16/2017  . PCOS (polycystic ovarian syndrome) 08/13/2017  . Mass 05/06/2017  . Vertigo 10/06/2016  .  Microcytic anemia 09/26/2016  . Pseudotumor cerebri syndrome 08/28/2016  . Obesity hypoventilation syndrome ([place]) 08/28/2016  . Benign paroxysmal positional vertigo due to bilateral vestibular disorder 08/28/2016  . OSA (obstructive sleep apnea) 08/28/2016  . Hypersomnia with sleep apnea 08/28/2016  . Female hirsutism 08/28/2016  . GERD (gastroesophageal reflux disease) 02/24/2015  . Constipation 10/02/2014    Jones Bales, PT, DPT 08/06/2020, 11:56 AM  [facility] [address] [place], [place], [address] Phone: [phone]   Fax:  [phone]  Name: Maureen Lewis MRN: [id number] Date of Birth: Aug 24, 1996

## 2020-08-07 ENCOUNTER — Ambulatory Visit (INDEPENDENT_AMBULATORY_CARE_PROVIDER_SITE_OTHER): Payer: Medicaid Other

## 2020-08-07 DIAGNOSIS — S93401S Sprain of unspecified ligament of right ankle, sequela: Secondary | ICD-10-CM | POA: Diagnosis not present

## 2020-08-07 DIAGNOSIS — S93401A Sprain of unspecified ligament of right ankle, initial encounter: Secondary | ICD-10-CM | POA: Diagnosis not present

## 2020-08-07 DIAGNOSIS — M25571 Pain in right ankle and joints of right foot: Secondary | ICD-10-CM | POA: Diagnosis not present

## 2020-08-09 NOTE — Progress Notes (Signed)
X-ray right ankle shows some soft tissue swelling but otherwise looks normal to radiology

## 2020-08-10 ENCOUNTER — Telehealth: Payer: Self-pay | Admitting: Rheumatology

## 2020-08-10 NOTE — Telephone Encounter (Signed)
I LMOM for patient to call office to reschedule her npt follow up appointment for two weeks out. Patient has not had labs drawn as of yet.

## 2020-08-14 ENCOUNTER — Ambulatory Visit: Payer: BC Managed Care – PPO | Admitting: Rheumatology

## 2020-08-14 DIAGNOSIS — J9601 Acute respiratory failure with hypoxia: Secondary | ICD-10-CM

## 2020-08-14 DIAGNOSIS — E662 Morbid (severe) obesity with alveolar hypoventilation: Secondary | ICD-10-CM

## 2020-08-14 DIAGNOSIS — M791 Myalgia, unspecified site: Secondary | ICD-10-CM

## 2020-08-14 DIAGNOSIS — R7303 Prediabetes: Secondary | ICD-10-CM

## 2020-08-14 DIAGNOSIS — M79641 Pain in right hand: Secondary | ICD-10-CM

## 2020-08-14 DIAGNOSIS — E559 Vitamin D deficiency, unspecified: Secondary | ICD-10-CM

## 2020-08-14 DIAGNOSIS — R700 Elevated erythrocyte sedimentation rate: Secondary | ICD-10-CM

## 2020-08-14 DIAGNOSIS — L680 Hirsutism: Secondary | ICD-10-CM

## 2020-08-14 DIAGNOSIS — G932 Benign intracranial hypertension: Secondary | ICD-10-CM

## 2020-08-14 DIAGNOSIS — D563 Thalassemia minor: Secondary | ICD-10-CM

## 2020-08-14 DIAGNOSIS — Z8719 Personal history of other diseases of the digestive system: Secondary | ICD-10-CM

## 2020-08-14 DIAGNOSIS — I10 Essential (primary) hypertension: Secondary | ICD-10-CM

## 2020-08-14 DIAGNOSIS — H8113 Benign paroxysmal vertigo, bilateral: Secondary | ICD-10-CM

## 2020-08-14 DIAGNOSIS — R768 Other specified abnormal immunological findings in serum: Secondary | ICD-10-CM

## 2020-08-14 DIAGNOSIS — G4710 Hypersomnia, unspecified: Secondary | ICD-10-CM

## 2020-08-14 DIAGNOSIS — E282 Polycystic ovarian syndrome: Secondary | ICD-10-CM

## 2020-08-14 DIAGNOSIS — Z8669 Personal history of other diseases of the nervous system and sense organs: Secondary | ICD-10-CM

## 2020-08-14 DIAGNOSIS — D509 Iron deficiency anemia, unspecified: Secondary | ICD-10-CM

## 2020-08-14 DIAGNOSIS — G4733 Obstructive sleep apnea (adult) (pediatric): Secondary | ICD-10-CM

## 2020-08-14 NOTE — Progress Notes (Signed)
Office Visit Note  Patient: Maureen Lewis             Date of Birth: 29-Mar-1996           MRN: [id number]             PCP: Dorothyann Peng, NP Referring: Dorothyann Peng, NP Visit Date: 08/27/2020 Occupation: _0 @  Subjective:  Pain in muscles and joints.   History of Present Illness: Khanh Nandia Renee Koppen is a 24 y.o. female with history of positive ANA and elevated sedimentation rate.  She returns for follow-up visit today.  She states the pain is mostly in her lower back and in her legs.  She denies any joint swelling or discomfort.  She states she has off-and-on discomfort in her hands and her knee joints.  She has a small child and she is not able to sleep much at night.  Activities of Daily Living:  Patient reports morning stiffness for 30 minutes.    Patient Reports nocturnal pain.  Difficulty dressing/grooming: Denies Difficulty climbing stairs: Denies Difficulty getting out of chair: Denies Difficulty using hands for taps, buttons, cutlery, and/or writing: Denies  Review of Systems  Constitutional: Negative for fatigue.  HENT: Negative for mouth sores, mouth dryness and nose dryness.   Eyes: Positive for pain and dryness. Negative for itching.  Respiratory: Negative for shortness of breath and difficulty breathing.   Cardiovascular: Negative for chest pain and palpitations.  Gastrointestinal: Negative for blood in stool, constipation and diarrhea.  Endocrine: Positive for increased urination.  Genitourinary: Negative for difficulty urinating and painful urination.  Musculoskeletal: Positive for arthralgias, joint pain, joint swelling, myalgias, morning stiffness, muscle tenderness and myalgias.  Skin: Negative for color change, rash and redness.  Allergic/Immunologic: Negative for susceptible to infections.  Neurological: Positive for dizziness, headaches and memory loss. Negative for numbness and weakness.  Hematological: Positive for  bruising/bleeding tendency.  Psychiatric/Behavioral: Positive for confusion and sleep disturbance.    PMFS History:  Patient Active Problem List   Diagnosis Date Noted  . Sprain of ankle 07/18/2020  . Acute hypoxemic respiratory failure due to COVID-19 ([place]) 06/11/2020  . Hx of migraine headaches 04/09/2020  . Postpartum care following cesarean delivery 11/22/2019  . Contraceptive management 11/22/2019  . Positive RPR test 10/09/2019  . HTN (hypertension) 09/23/2019  . Migraine headache 07/12/2019  . Alpha thalassemia trait   . Vitamin D deficiency 09/16/2017  . Prediabetes 09/16/2017  . Depression 09/16/2017  . Class 3 severe obesity with serious comorbidity and body mass index (BMI) of 50.0 to 59.9 in adult ([place]) 09/16/2017  . PCOS (polycystic ovarian syndrome) 08/13/2017  . Mass 05/06/2017  . Vertigo 10/06/2016  . Microcytic anemia 09/26/2016  . Pseudotumor cerebri syndrome 08/28/2016  . Obesity hypoventilation syndrome ([place]) 08/28/2016  . Benign paroxysmal positional vertigo due to bilateral vestibular disorder 08/28/2016  . OSA (obstructive sleep apnea) 08/28/2016  . Hypersomnia with sleep apnea 08/28/2016  . Female hirsutism 08/28/2016  . GERD (gastroesophageal reflux disease) 02/24/2015  . Constipation 10/02/2014    Past Medical History:  Diagnosis Date  . Abnormal uterine bleeding   . Acid reflux   . Alpha thalassemia trait   . Amenorrhea   . Anemia    no current med.  . Back pain   . Cesarean delivery delivered 10/11/2019   10/09/2019 - primary CS for failed IOL  . Chest pain   . Complication of anesthesia    states had to keep giving her anesthesia during  EGD  . Constipation   . Depression    "I'm good"  . Finger mass, left 03/2017   middle finger  . Gestational diabetes   . Gestational diabetes 05/23/2019   Current Diabetic Medications:  None  [ ] Aspirin 81 mg daily after 12 weeks (? A2/B GDM)  Required Referrals for A1GDM or A2GDM: [ ] Diabetes  Education and Testing Supplies [ ] Nutrition Cousult  For A2/B GDM or higher classes of DM [ ] Diabetes Education and Equities trader [ ] Nutrition Counsult [ ] Fetal ECHO after 20 weeks  [ ] Eye exam for retina evaluation   Baseline and surveillance labs (  . Gestational hypertension 09/23/2019   Guidelines for Antenatal Testing and Sonography  (with updated ICD-10 codes)  Updated  25-Sep-2019 with Dr. Tama High  INDICATION U/S 2 X week NST/AFI  or full BPP wkly DELIVERY Diabetes   A1 - good control - O24.410    A2 - good control - O24.419      A2  - poor control or poor compliance - O24.419, E11.65   (Macrosomia or polyhydramnios) **E11.65 is extra code for poor control**    A2/B - O24.  Marland Kitchen Headache   . Hypertension   . Infection    UTI  . Irritable bowel syndrome (IBS)   . Joint pain   . Morbid obesity with body mass index (BMI) of 45.0 to 49.9 in adult [facility])   . Obesity during pregnancy, antepartum 04/06/2019   Body mass index is 53.71 kg/m.  Recommendations [x] Aspirin 81 mg daily after 12 weeks; discontinue after 36 weeks [ ] Nutrition consult [ ] Weight gain 11-20 lbs for singleton and 25-35 lbs for twin pregnancy (IOM guidelines) Higher class of obesity patients recommended to gain closer to lower limit  Weight loss is associated with adverse outcomes [ ] Baseline and surveillance labs (pulled in fr  . Plantar fasciitis, bilateral   . Polycystic ovary syndrome   . Prediabetes    "prediabetes"  . Pregnancy induced hypertension   . Shortness of breath   . Sleep apnea    no CPAP use  . Supervision of normal first pregnancy 04/06/2019   [name]: [ x]Initial [ x]12 [ ]20 [ ]28 [ ]32 [ ]36 [ ]38 [ ]39 [ ]40 Nursing Staff Provider Office Location [facility]  Dating   LMP Language  English  Anatomy US  Nml Flu Vaccine  Declined 07/28/19 Genetic Screen  NIPS: low risks   AFP:   negative  TDaP vaccine   08/25/19 Hgb A1C or  GTT Early A1C 5.8 Third trimester: GDM insulin Rhogam  NA   LAB  RESULTS  Feeding Plan Breast Blood Type  . Vertigo 2017    Family History  Problem Relation Age of Onset  . Diabetes Maternal Aunt   . Hypertension Maternal Uncle   . Depression Maternal Grandfather   . Diabetes Maternal Grandfather   . Hypertension Maternal Grandfather   . Arthritis Mother   . High blood pressure Mother   . Depression Mother   . Sleep apnea Mother   . Obesity Mother   . Diabetes Mother   . Hypertension Mother   . Kidney failure Mother   . Sleep apnea Father   . Obesity Father   . Healthy Daughter    Past Surgical History:  Procedure Laterality Date  . CESAREAN SECTION N/A 10/09/2019   Procedure: CESAREAN SECTION;  Surgeon: Aletha Halim, MD;  Location: [facility] LD  ORS;  Service: Obstetrics;  Laterality: N/A;  . COLONOSCOPY WITH PROPOFOL  10/07/2016  . ESOPHAGOGASTRODUODENOSCOPY  12/21/2015  . EXCISION MASS UPPER EXTREMETIES Left 04/21/2017   Procedure: EXCISION MASS LEFT MIDDLE FINGER;  Surgeon: Daryll Brod, MD;  Location: [facility];  Service: Orthopedics;  Laterality: Left;  . IR FL GUIDED LOC OF NEEDLE/CATH TIP FOR SPINAL INJECTION RT  03/13/2020   Social History   Social History Narrative   Right handed    Soda sometimes   Lives with mom and cousin, a grandmother in [place] also helps care for her.       She is in nursing school.    Immunization History  Administered Date(s) Administered  . HPV Quadrivalent 05/20/2012, 07/22/2012, 08/23/2013  . Influenza,inj,Quad PF,6+ Mos 08/23/2013, 07/18/2014, 08/25/2019  . PFIZER SARS-COV-2 Vaccination 07/24/2020, 08/17/2020  . Tdap 07/12/2009, 08/25/2019     Objective: Vital Signs: BP (!) 159/103 (BP Location: Left Wrist, Patient Position: Sitting, Cuff Size: Normal)   Pulse (!) 103   Resp 17   Ht 6' 2.5" (1.892 m)   Wt (!) 451 lb (204.6 kg)   BMI 57.13 kg/m    Physical Exam Vitals and nursing note reviewed.  Constitutional:      Appearance: She is well-developed.  HENT:     Head:  Normocephalic and atraumatic.  Eyes:     Conjunctiva/sclera: Conjunctivae normal.  Cardiovascular:     Rate and Rhythm: Normal rate and regular rhythm.     Heart sounds: Normal heart sounds.  Pulmonary:     Effort: Pulmonary effort is normal.     Breath sounds: Normal breath sounds.  Abdominal:     General: Bowel sounds are normal.     Palpations: Abdomen is soft.  Musculoskeletal:     Cervical back: Normal range of motion.  Lymphadenopathy:     Cervical: No cervical adenopathy.  Skin:    General: Skin is warm and dry.     Capillary Refill: Capillary refill takes less than 2 seconds.  Neurological:     Mental Status: She is alert and oriented to person, place, and time.  Psychiatric:        Behavior: Behavior normal.      Musculoskeletal Exam: C-spine thoracic and lumbar spine with good range of motion.  Shoulder joints, elbow joints, wrist joints, MCPs PIPs and DIPs with good range of motion with no synovitis.  Hip joints, knee joints, ankles, MTPs and PIPs with good range of motion with no synovitis.  She had few tender points.  CDAI Exam: CDAI Score: -- Patient Global: --; Provider Global: -- Swollen: --; Tender: -- Joint Exam 08/27/2020   No joint exam has been documented for this visit   There is currently no information documented on the homunculus. Go to the Rheumatology activity and complete the homunculus joint exam.  Investigation: No additional findings.  Imaging: DG Ankle Complete Right  Result Date: 08/08/2020 CLINICAL DATA:  Ankle pain since a sprain injury 1 month ago. EXAM: RIGHT ANKLE - COMPLETE 3+ VIEW COMPARISON:  07/10/2020 FINDINGS: A diffuse soft tissue swelling without significant change. No fracture, dislocation or effusion seen. IMPRESSION: No acute abnormality. Electronically Signed   By: Claudie Revering M.D.   On: 08/08/2020 16:02    Recent Labs: Lab Results  Component Value Date   WBC 7.8 06/26/2020   HGB 12.6 06/26/2020   PLT 296  06/26/2020   NA 141 06/26/2020   K 4.1 06/26/2020   CL 109 06/26/2020  CO2 23 06/26/2020   GLUCOSE 102 (H) 06/26/2020   BUN 12 06/26/2020   CREATININE 0.71 06/26/2020   BILITOT 0.9 06/26/2020   ALKPHOS 52 06/15/2020   AST 15 06/26/2020   ALT 19 06/26/2020   PROT 7.1 08/22/2020   ALBUMIN 3.4 (L) 06/15/2020   CALCIUM 10.0 06/26/2020   GFRAA 141 06/26/2020   August 22, 2020 SPEP consistent with acute inflammatory pattern, immunoglobulins normal, ESR 25, RF negative, anti-CCP negative  Speciality Comments: No specialty comments available.  Procedures:  No procedures performed Allergies: Bee pollen, Bee venom, Hydrocodone-acetaminophen, Peach flavor, Pollen extract, and Remdesivir   Assessment / Plan:     Visit Diagnoses: Positive ANA (antinuclear antibody) - Feb 21, 2020 ANA 1: 40 speckled.  Not a significant titer.  She had no clinical features of lupus.   Pain in both hands - Xrays were unremarkable.  RF negative, anti-CCP was negative.  ESR 25 mildly elevated.  She had no synovitis on examination.  She had no tenderness on my examination.  Myalgia - CK was normal.  She may have underlying myofascial pain.  The findings were discussed with the patient.  Need for regular exercise, good sleep hygiene was emphasized.  Essential hypertension-blood pressure is elevated.  She has been advised to monitor blood pressure closely and follow-up with her PCP.  Prediabetes  History of gastroesophageal reflux (GERD)  Hx of migraine headaches  Benign paroxysmal positional vertigo due to bilateral vestibular disorder  Pseudotumor cerebri syndrome  OSA (obstructive sleep apnea)  Hypersomnia with sleep apnea  Obesity hypoventilation syndrome ([facility])  Acute hypoxemic respiratory failure due to COVID-19 ([facility])  Microcytic anemia  Vitamin D deficiency  Alpha thalassemia trait  PCOS (polycystic ovarian syndrome)  Female hirsutism  Orders: No orders of the defined types were  placed in this encounter.  No orders of the defined types were placed in this encounter.   Follow-Up Instructions: Return if symptoms worsen or fail to improve, for myalgia.   Bo Merino, MD  Note - This record has been created using Editor, commissioning.  Chart creation errors have been sought, but may not always  have been located. Such creation errors do not reflect on  the standard of medical care.

## 2020-08-15 ENCOUNTER — Telehealth: Payer: Self-pay | Admitting: Family Medicine

## 2020-08-15 NOTE — Telephone Encounter (Signed)
Patient called stating that [facility] is needing a referral from Dr Georgina Snell so that she can follow up with them regarding her concussion. Can this be put in for her please?

## 2020-08-16 ENCOUNTER — Other Ambulatory Visit: Payer: Self-pay

## 2020-08-16 ENCOUNTER — Ambulatory Visit: Payer: Self-pay

## 2020-08-16 ENCOUNTER — Ambulatory Visit: Payer: BC Managed Care – PPO | Admitting: Physical Therapy

## 2020-08-16 ENCOUNTER — Ambulatory Visit (INDEPENDENT_AMBULATORY_CARE_PROVIDER_SITE_OTHER): Payer: BC Managed Care – PPO | Admitting: Family Medicine

## 2020-08-16 ENCOUNTER — Encounter: Payer: Self-pay | Admitting: Family Medicine

## 2020-08-16 VITALS — BP 122/78 | HR 102 | Ht 74.5 in | Wt >= 6400 oz

## 2020-08-16 DIAGNOSIS — S93401S Sprain of unspecified ligament of right ankle, sequela: Secondary | ICD-10-CM

## 2020-08-16 DIAGNOSIS — S060X0D Concussion without loss of consciousness, subsequent encounter: Secondary | ICD-10-CM | POA: Diagnosis not present

## 2020-08-16 DIAGNOSIS — G932 Benign intracranial hypertension: Secondary | ICD-10-CM

## 2020-08-16 MED ORDER — ACETAZOLAMIDE ER 500 MG PO CP12
500.0000 mg | ORAL_CAPSULE | Freq: Two times a day (BID) | ORAL | 2 refills | Status: DC
Start: 2020-08-16 — End: 2021-05-15

## 2020-08-16 NOTE — Progress Notes (Signed)
I, Wendy Poet, LAT, ATC, am serving as scribe for Dr. [name].  Maureen Lewis is a 24 y.o. female who presents to [place] at [facility] today for f/u of R ankle pain after injuring her ankle when she fell down some stairs on 07/10/20.  She was last seen by Dr. Georgina Snell on 08/03/20 for f/u of a concussion that she sustained at the same time and for her R ankle.  She had tried to transition out of her walking boot into a lace-up ankle brace and reported increased pain in her ankle.  Pt was advised to use walking boot as needed.  Since her last visit, pt reports not much improvement. She is still using the boot when she is moving around. She is still having significant lat and anterior ankle pain. Swelling still present. Treatments: ice and heat. Continuing PT for concussios.  She notes continued headache thought to be secondary to concussion and pseudotumor cerebri.  At last visit she was referred to neurology and prescribed acetazolamide.  Unfortunately E prescribing was not working correctly that day and she never received the acetazolamide.  Additionally there is some issue with the neurology referral and it needs to be sent again.  Diagnostic testing: R ankle XR- 08/07/20, 07/10/20   Pertinent review of systems: No fevers or chills  Relevant historical information: Hypertension, pseudotumor cerebri   Exam:  BP 122/78   Pulse (!) 102   Ht 6' 2.5" (1.892 m)   Wt (!) 473 lb (214.6 kg)   SpO2 95%   BMI 59.92 kg/m  General: Morbidly obese, well nourished, and in no acute distress.   MSK: Right ankle still swollen somewhat laterally.  Tender palpation posterior to the lateral malleolus.  Decreased ankle motion.  Some pain with resisted ankle eversion.  Pulses cap refill and sensation station are intact distally.  Neuro: Alert and oriented normal coordination  Lab and Radiology Results Diagnostic Limited MSK Ultrasound of: Right ankle Peroneal tendon  visualized at lateral malleolus.  Hypoechoic change present at peroneal tendon and near tendon sheath consistent with either a linear split tear or significant tendinopathy.  Tender palpation in this area.  Normal-appearing tendon structures more distally towards the fifth metatarsal Impression: Peroneal tendinopathy versus partial tear      Assessment and Plan: 24 y.o. female with persistent right ankle sprain after inversion injury.  Failing to improve with typical conservative management for about 6 weeks now.  X-rays were normal.  Plan to obtain MRI to further characterize pain and evaluate possible tendon tear and plan for potential surgery.  Recheck after MRI.  Will ask neuro rehabilitation physical therapy to start working on ankle as well.  If not able we will send conventional physical therapy referral.  Home exercise program for this also taught in clinic today by ATC.  Headache due to concussion and also thought to be due to pseudotumor cerebri.  Failed to improve with typical management.  At the last visit plan to start acetazolamide which did not happen.  This was represcribed today.  Additionally referral sent to neurology for further help with this.  Recheck after MRI PDMP not reviewed this encounter. Orders Placed This Encounter  Procedures  . [name] LIMITED JOINT SPACE STRUCTURES LOW RIGHT(NO LINKED CHARGES)    Order Specific Question:   Reason for Exam (SYMPTOM  OR DIAGNOSIS REQUIRED)    Answer:   R ankle pain    Order Specific Question:   Preferred imaging location?  Answer:   [facility]  . MR ANKLE RIGHT WO CONTRAST    Standing Status:   Future    Standing Expiration Date:   08/16/2021    Order Specific Question:   What is the patient's sedation requirement?    Answer:   No Sedation    Order Specific Question:   Does the patient have a pacemaker or implanted devices?    Answer:   No    Order Specific Question:   Preferred imaging location?     Answer:   [facility] (table limit-550lbs)  . Ambulatory referral to Neurology    Referral Priority:   Routine    Referral Type:   Consultation    Referral Reason:   Specialty Services Required    Requested Specialty:   Neurology    Number of Visits Requested:   1  . Ambulatory referral to Physical Therapy    Referral Priority:   Routine    Referral Type:   Physical Medicine    Referral Reason:   Specialty Services Required    Requested Specialty:   Physical Therapy   Meds ordered this encounter  Medications  . acetaZOLAMIDE (DIAMOX) 500 MG capsule    Sig: Take 1 capsule (500 mg total) by mouth 2 (two) times daily. For headache    Dispense:  60 capsule    Refill:  2     Discussed warning signs or symptoms. Please see discharge instructions. Patient expresses understanding.   The above documentation has been reviewed and is accurate and complete [name], M.D.

## 2020-08-16 NOTE — Patient Instructions (Addendum)
Thank you for coming in today.  Plan for MRI ankle.  Recheck following MRI.  I will ask PT to start working on your ankle.   For headache start Acetazolamide.  Continue current medicines otherwise.   Plan for PT ankle.   Please perform the exercise program that we have prepared for you and gone over in detail on a daily basis.  In addition to the handout you were provided you can access your program through: [email or web address]   Your unique program code is: [email or web address]

## 2020-08-17 ENCOUNTER — Ambulatory Visit: Payer: Medicaid Other | Admitting: Family Medicine

## 2020-08-19 ENCOUNTER — Other Ambulatory Visit: Payer: Self-pay

## 2020-08-19 ENCOUNTER — Encounter (HOSPITAL_COMMUNITY): Payer: Self-pay | Admitting: *Deleted

## 2020-08-19 ENCOUNTER — Ambulatory Visit (HOSPITAL_COMMUNITY)
Admission: EM | Admit: 2020-08-19 | Discharge: 2020-08-19 | Disposition: A | Discharge status: Home / Self Care | Payer: Medicaid Other | Attending: Internal Medicine | Admitting: Internal Medicine

## 2020-08-19 DIAGNOSIS — H00011 Hordeolum externum right upper eyelid: Secondary | ICD-10-CM

## 2020-08-19 DIAGNOSIS — Z3202 Encounter for pregnancy test, result negative: Secondary | ICD-10-CM

## 2020-08-19 LAB — POC URINE PREG, ED: Preg Test, Ur: NEGATIVE

## 2020-08-19 MED ORDER — ERYTHROMYCIN 5 MG/GM OP OINT: 1.0000 "application " | TOPICAL_OINTMENT | Freq: Four times a day (QID) | OPHTHALMIC | 0 refills | Status: DC

## 2020-08-19 MED ORDER — ERYTHROMYCIN 5 MG/GM OP OINT: 1.0000 "application " | TOPICAL_OINTMENT | Freq: Four times a day (QID) | OPHTHALMIC | 0 refills | Status: AC

## 2020-08-19 MED ORDER — SULFAMETHOXAZOLE-TRIMETHOPRIM 800-160 MG PO TABS: 1.0000 | ORAL_TABLET | Freq: Two times a day (BID) | ORAL | 0 refills | Status: AC

## 2020-08-19 NOTE — ED Provider Notes (Signed)
[place]    CSN: [id number] Arrival date & time: 08/19/20  1032      History   Chief Complaint Chief Complaint  Patient presents with  . Eye Problem    HPI Maureen Lewis is a 24 y.o. female.   Maureen Lewis presents with complaints of right upper eye lid pain and swelling. Started two days ago a few hours after getting her covid vaccine. Has increased in size and tenderness. Has had mattering from eye. Eye ball does feel painful with movement. No injury to the eye. No known foreign body or exposure to the eye. Has been applying warm compresses which haven't helped. Vision is blurred but no loss of vision. Some photophobia.     ROS per HPI, negative if not otherwise mentioned.      Past Medical History:  Diagnosis Date  . Abnormal uterine bleeding   . Acid reflux   . Alpha thalassemia trait   . Amenorrhea   . Anemia    no current med.  . Back pain   . Cesarean delivery delivered 10/11/2019   10/09/2019 - primary CS for failed IOL  . Chest pain   . Complication of anesthesia    states had to keep giving her anesthesia during EGD  . Constipation   . Depression    "I'm good"  . Finger mass, left 03/2017   middle finger  . Gestational diabetes   . Gestational diabetes 05/23/2019   Current Diabetic Medications:  None  [ ]  Aspirin 81 mg daily after 12 weeks (? A2/B GDM)  Required Referrals for A1GDM or A2GDM: [ ]  Diabetes Education and Testing Supplies [ ]  Nutrition Cousult  For A2/B GDM or higher classes of DM [ ]  Diabetes Education and Testing Supplies [ ]  Nutrition Counsult [ ]  Fetal ECHO after 20 weeks  [ ]  Eye exam for retina evaluation   Baseline and surveillance labs (  . Gestational hypertension 09/23/2019   Guidelines for Antenatal Testing and Sonography  (with updated ICD-10 codes)  Updated  28-Sep-2019 with Dr. Tama High  INDICATION U/S 2 X week NST/AFI  or full BPP wkly DELIVERY Diabetes   A1 - good control - O24.410    A2  - good control - O24.419      A2  - poor control or poor compliance - O24.419, E11.65   (Macrosomia or polyhydramnios) **E11.65 is extra code for poor control**    A2/B - O24.  Marland Kitchen Headache   . Hypertension   . Infection    UTI  . Irritable bowel syndrome (IBS)   . Joint pain   . Morbid obesity with body mass index (BMI) of 45.0 to 49.9 in adult [facility])   . Obesity during pregnancy, antepartum 04/06/2019   Body mass index is 53.71 kg/m.  Recommendations [x]  Aspirin 81 mg daily after 12 weeks; discontinue after 36 weeks [ ]  Nutrition consult [ ]  Weight gain 11-20 lbs for singleton and 25-35 lbs for twin pregnancy (IOM guidelines) Higher class of obesity patients recommended to gain closer to lower limit  Weight loss is associated with adverse outcomes [ ]  Baseline and surveillance labs (pulled in fr  . Plantar fasciitis, bilateral   . Polycystic ovary syndrome   . Prediabetes    "prediabetes"  . Pregnancy induced hypertension   . Shortness of breath   . Sleep apnea    no CPAP use  . Supervision of normal first pregnancy 04/06/2019   BABYSCRIPTS  PATIENT: [ x]Initial [ x]12 $Remo'[ ]'gqBbR$ 20 $Remo'[ ]'FIqCX$ 28 $Remo'[ ]'Qfprl$ 32 $Remo'[ ]'kMrrc$ 36 $Remo'[ ]'CDIia$ 38 $Remo'[ ]'vgWzL$ 39 $Remo'[ ]'PHCDX$ 40 Nursing Staff Provider Office Location [facility]  Dating   LMP Language  English  Anatomy US  Nml Flu Vaccine  Declined 07/28/19 Genetic Screen  NIPS: low risks   AFP:   negative  TDaP vaccine   08/25/19 Hgb A1C or  GTT Early A1C 5.8 Third trimester: GDM insulin Rhogam  NA   LAB RESULTS  Feeding Plan Breast Blood Type  . Vertigo 2017    Patient Active Problem List   Diagnosis Date Noted  . Sprain of ankle 07/18/2020  . Acute hypoxemic respiratory failure due to COVID-19 ([place]) 06/11/2020  . Hx of migraine headaches 04/09/2020  . Postpartum care following cesarean delivery 11/22/2019  . Contraceptive management 11/22/2019  . Positive RPR test 10/09/2019  . HTN (hypertension) 09/23/2019  . Migraine headache 07/12/2019  . Alpha thalassemia trait   . Vitamin D deficiency 09/16/2017  .  Prediabetes 09/16/2017  . Depression 09/16/2017  . Class 3 severe obesity with serious comorbidity and body mass index (BMI) of 50.0 to 59.9 in adult ([place]) 09/16/2017  . PCOS (polycystic ovarian syndrome) 08/13/2017  . Mass 05/06/2017  . Vertigo 10/06/2016  . Microcytic anemia 09/26/2016  . Pseudotumor cerebri syndrome 08/28/2016  . Obesity hypoventilation syndrome ([place]) 08/28/2016  . Benign paroxysmal positional vertigo due to bilateral vestibular disorder 08/28/2016  . OSA (obstructive sleep apnea) 08/28/2016  . Hypersomnia with sleep apnea 08/28/2016  . Female hirsutism 08/28/2016  . GERD (gastroesophageal reflux disease) 02/24/2015  . Constipation 10/02/2014    Past Surgical History:  Procedure Laterality Date  . CESAREAN SECTION N/A 10/09/2019   Procedure: CESAREAN SECTION;  Surgeon: Aletha Halim, MD;  Location: [facility] LD [facility];  Service: Obstetrics;  Laterality: N/A;  . COLONOSCOPY WITH PROPOFOL  10/07/2016  . ESOPHAGOGASTRODUODENOSCOPY  12/21/2015  . EXCISION MASS UPPER EXTREMETIES Left 04/21/2017   Procedure: EXCISION MASS LEFT MIDDLE FINGER;  Surgeon: Daryll Brod, MD;  Location: [facility];  Service: Orthopedics;  Laterality: Left;  . IR FL GUIDED LOC OF NEEDLE/CATH TIP FOR SPINAL INJECTION RT  03/13/2020    OB History    Gravida  1   Para  1   Term  1   Preterm  0   AB  0   Living  1     SAB  0   TAB  0   Ectopic  0   Multiple  0   Live Births  1            Home Medications    Prior to Admission medications   Medication Sig Start Date End Date Taking? Authorizing Provider  albuterol (VENTOLIN HFA) 108 (90 Base) MCG/ACT inhaler Inhale 2 puffs into the lungs every 6 (six) hours as needed for wheezing or shortness of breath. 06/15/20  Yes Ghimire, Henreitta Leber, MD  amitriptyline (ELAVIL) 25 MG tablet Take 1-2 tablets (25-50 mg total) by mouth at bedtime. 08/03/20  Yes Gregor Hams, MD  amLODipine (NORVASC) 10 MG tablet Take 1 tablet (10 mg  total) by mouth daily. 10/27/19  Yes Lavonia Drafts, MD  baclofen (LIORESAL) 10 MG tablet Take 1 tablet (10 mg total) by mouth 2 (two) times daily. 02/28/20  Yes Nafziger, Tommi Rumps, NP  diclofenac (VOLTAREN) 75 MG EC tablet Take 1 tablet (75 mg total) by mouth 2 (two) times daily. 07/11/20  Yes Hudnall, Sharyn Lull, MD  hydrochlorothiazide (HYDRODIURIL) 25 MG tablet  Take 1 tablet (25 mg total) by mouth daily. 10/27/19  Yes Lavonia Drafts, MD  loratadine (CLARITIN) 10 MG tablet Take 10 mg by mouth daily as needed for allergies.    Yes [provider]  metFORMIN (GLUCOPHAGE-XR) 500 MG 24 hr tablet SMARTSIG:2 Tablet(s) By Mouth Morning-Evening 04/21/20  Yes [provider]  pantoprazole (PROTONIX) 40 MG tablet Take 1 tablet (40 mg total) by mouth daily. 05/04/20  Yes Nafziger, Tommi Rumps, NP  polyethylene glycol powder (GLYCOLAX/MIRALAX) 17 GM/SCOOP powder Take 17 g by mouth daily as needed for moderate constipation.  10/11/19  Yes [provider]  topiramate (TOPAMAX) 25 MG tablet Take 2 tablets (50 mg total) by mouth 2 (two) times daily. 02/23/20  Yes Dohmeier, Asencion Partridge, MD  Vitamin D, Ergocalciferol, (DRISDOL) 1.25 MG (50000 UNIT) CAPS capsule Take 1 capsule (50,000 Units total) by mouth every 7 (seven) days. 04/24/20  Yes Eber Jones, MD  acetaZOLAMIDE (DIAMOX) 500 MG capsule Take 1 capsule (500 mg total) by mouth 2 (two) times daily. For headache 08/16/20   Gregor Hams, MD  Blood Pressure Monitoring KIT 1 kit by Does not apply route once a week. 05/05/19   Sloan Leiter, MD  erythromycin ophthalmic ointment Place 1 application into the right eye 4 (four) times daily for 7 days. 08/19/20 08/26/20  Augusto Gamble B, NP  ferrous sulfate 325 (65 FE) MG tablet Take 1 tablet (325 mg total) by mouth every other day. 02/23/20 07/23/20  Nafziger, Tommi Rumps, NP  Semaglutide-Weight Management (WEGOVY) 0.5 MG/0.5ML SOAJ Inject 0.5 mg into the skin once a week. 06/06/20   Eber Jones,  MD  sulfamethoxazole-trimethoprim (BACTRIM DS) 800-160 MG tablet Take 1 tablet by mouth 2 (two) times daily for 5 days. 08/19/20 08/24/20  Zigmund Gottron, NP    Family History Family History  Problem Relation Age of Onset  . Diabetes Maternal Aunt   . Hypertension Maternal Uncle   . Depression Maternal Grandfather   . Diabetes Maternal Grandfather   . Hypertension Maternal Grandfather   . Arthritis Mother   . High blood pressure Mother   . Depression Mother   . Sleep apnea Mother   . Obesity Mother   . Diabetes Mother   . Hypertension Mother   . Kidney failure Mother   . Sleep apnea Father   . Obesity Father   . Healthy Daughter     Social History Social History   Tobacco Use  . Smoking status: Never Smoker  . Smokeless tobacco: Never Used  Vaping Use  . Vaping Use: Never used  Substance Use Topics  . Alcohol use: Not Currently    Comment: social   . Drug use: No     Allergies   Bee pollen, Bee venom, Hydrocodone-acetaminophen, Peach flavor, Pollen extract, and Remdesivir   Review of Systems Review of Systems   Physical Exam Triage Vital Signs ED Triage Vitals  Enc Vitals Group     BP 08/19/20 1138 128/83     Pulse Rate 08/19/20 1138 (!) 108     Resp 08/19/20 1138 18     Temp 08/19/20 1138 98 F (36.7 C)     Temp Source 08/19/20 1138 Oral     SpO2 08/19/20 1138 98 %     Weight 08/19/20 1141 (!) 473 lb (214.6 kg)     Height 08/19/20 1141 6' 2.5" (1.892 m)     Head Circumference --      Peak Flow --  Pain Score 08/19/20 1140 7     Pain Loc --      Pain Edu? --      Excl. in [place]? --    No data found.  Updated Vital Signs BP 128/83 (BP Location: Left Arm)   Pulse (!) 108   Temp 98 F (36.7 C) (Oral)   Resp 18   Ht 6' 2.5" (1.892 m)   Wt (!) 473 lb (214.6 kg)   SpO2 98%   BMI 59.92 kg/m   Visual Acuity Right Eye Distance: 20/100 Pt did not bring her glasses Left Eye Distance: 20/50 Pt did not bring her glasses Bilateral  Distance: 20/50 Pt did not bring her glasses  Right Eye Near:   Left Eye Near:    Bilateral Near:     Physical Exam Constitutional:      General: She is not in acute distress.    Appearance: She is well-developed.  Eyes:     General:        Right eye: Hordeolum present.     Extraocular Movements: Extraocular movements intact.     Conjunctiva/sclera: Conjunctivae normal.     Pupils: Pupils are equal, round, and reactive to light.   Cardiovascular:     Rate and Rhythm: Normal rate.  Pulmonary:     Effort: Pulmonary effort is normal.  Skin:    General: Skin is warm and dry.  Neurological:     Mental Status: She is alert and oriented to person, place, and time.      UC Treatments / Results  Labs (all labs ordered are listed, but only abnormal results are displayed) Labs Reviewed  POC URINE PREG, ED    EKG   Radiology No results found.  Procedures Procedures (including critical care time)  Medications Ordered in UC Medications - No data to display  Initial Impression / Assessment and Plan / UC Course  I have reviewed the triage vital signs and the nursing notes.  Pertinent labs & imaging results that were available during my care of the patient were reviewed by me and considered in my medical decision making (see chart for details).     Right upper eyelid hordeolum with treatment provided and recommended. Return precautions provided. Patient verbalized understanding and agreeable to plan.   Final Clinical Impressions(s) / UC Diagnoses   Final diagnoses:  Hordeolum of right upper eyelid, unspecified hordeolum type     Discharge Instructions     Topical eye ointment as prescribed as well as 5 days of oral antibiotics.  Continue with warm compresses.  Please return if worsening- fevers, loss of vision, worsening of redness or pain     ED Prescriptions    Medication Sig Dispense Auth. Provider   erythromycin ophthalmic ointment Place 1 application into  the right eye 4 (four) times daily for 7 days. 28 g Augusto Gamble B, NP   sulfamethoxazole-trimethoprim (BACTRIM DS) 800-160 MG tablet Take 1 tablet by mouth 2 (two) times daily for 5 days. 10 tablet Zigmund Gottron, NP     PDMP not reviewed this encounter.   Zigmund Gottron, NP 08/19/20 1228

## 2020-08-19 NOTE — ED Triage Notes (Signed)
Pt reports  RT eye pain started Friday and swelling started sat and swelling is worse today. Pt does not remember any injury to eye. Pt did report this morning there was drainage to RT eye.

## 2020-08-19 NOTE — Discharge Instructions (Signed)
Topical eye ointment as prescribed as well as 5 days of oral antibiotics.  Continue with warm compresses.  Please return if worsening- fevers, loss of vision, worsening of redness or pain

## 2020-08-22 ENCOUNTER — Telehealth: Payer: Self-pay

## 2020-08-22 DIAGNOSIS — R768 Other specified abnormal immunological findings in serum: Secondary | ICD-10-CM

## 2020-08-22 DIAGNOSIS — R700 Elevated erythrocyte sedimentation rate: Secondary | ICD-10-CM

## 2020-08-22 DIAGNOSIS — M791 Myalgia, unspecified site: Secondary | ICD-10-CM

## 2020-08-22 NOTE — Telephone Encounter (Signed)
Patient requested her labwork orders be sent to [facility] on [address].  Patient states she is going today at 12:00 pm.

## 2020-08-22 NOTE — Telephone Encounter (Signed)
Lab Orders released.  

## 2020-08-23 ENCOUNTER — Other Ambulatory Visit: Payer: Self-pay

## 2020-08-23 ENCOUNTER — Encounter: Payer: Self-pay | Admitting: Neurology

## 2020-08-23 ENCOUNTER — Telehealth: Payer: Self-pay | Admitting: Neurology

## 2020-08-23 ENCOUNTER — Ambulatory Visit (INDEPENDENT_AMBULATORY_CARE_PROVIDER_SITE_OTHER): Payer: BC Managed Care – PPO | Admitting: Neurology

## 2020-08-23 ENCOUNTER — Encounter: Payer: Self-pay | Admitting: Physical Therapy

## 2020-08-23 ENCOUNTER — Ambulatory Visit: Payer: BC Managed Care – PPO | Attending: Adult Health | Admitting: Physical Therapy

## 2020-08-23 VITALS — BP 122/78 | HR 94 | Ht 74.5 in | Wt >= 6400 oz

## 2020-08-23 DIAGNOSIS — G4733 Obstructive sleep apnea (adult) (pediatric): Secondary | ICD-10-CM | POA: Diagnosis not present

## 2020-08-23 DIAGNOSIS — R412 Retrograde amnesia: Secondary | ICD-10-CM | POA: Diagnosis not present

## 2020-08-23 DIAGNOSIS — Z6841 Body Mass Index (BMI) 40.0 and over, adult: Secondary | ICD-10-CM

## 2020-08-23 DIAGNOSIS — M542 Cervicalgia: Secondary | ICD-10-CM | POA: Insufficient documentation

## 2020-08-23 DIAGNOSIS — H8111 Benign paroxysmal vertigo, right ear: Secondary | ICD-10-CM

## 2020-08-23 DIAGNOSIS — E662 Morbid (severe) obesity with alveolar hypoventilation: Secondary | ICD-10-CM

## 2020-08-23 DIAGNOSIS — E66813 Obesity, class 3: Secondary | ICD-10-CM

## 2020-08-23 DIAGNOSIS — S069X0D Unspecified intracranial injury without loss of consciousness, subsequent encounter: Secondary | ICD-10-CM | POA: Diagnosis not present

## 2020-08-23 DIAGNOSIS — R262 Difficulty in walking, not elsewhere classified: Secondary | ICD-10-CM | POA: Insufficient documentation

## 2020-08-23 DIAGNOSIS — G932 Benign intracranial hypertension: Secondary | ICD-10-CM

## 2020-08-23 DIAGNOSIS — F0781 Postconcussional syndrome: Secondary | ICD-10-CM

## 2020-08-23 DIAGNOSIS — R2681 Unsteadiness on feet: Secondary | ICD-10-CM | POA: Diagnosis not present

## 2020-08-23 DIAGNOSIS — M25571 Pain in right ankle and joints of right foot: Secondary | ICD-10-CM | POA: Insufficient documentation

## 2020-08-23 DIAGNOSIS — R42 Dizziness and giddiness: Secondary | ICD-10-CM | POA: Diagnosis not present

## 2020-08-23 NOTE — Therapy (Signed)
[facility] [address] [place], [place], [address] Phone: [phone]   Fax:  [phone]  Physical Therapy Treatment  Patient Details  Name: Maureen Lewis MRN: [id number] Date of Birth: 1996/08/14 Referring Provider (PT): Gregor Hams, MD   Encounter Date: 08/23/2020   PT End of Session - 08/23/20 1546    Visit Number 3    Number of Visits 6   vestibular   Date for PT Re-Evaluation 09/16/20   vestibular   Authorization Type Medicaid [employer]    Authorization Time Period requesting 6 visits, 1x/week x 6  (has used eval + 10 visits at [facility])    PT Start Time 1500   arrived 15 min late   PT Stop Time 1535    PT Time Calculation (min) 35 min    Activity Tolerance Patient tolerated treatment well    Behavior During Therapy [facility] for tasks assessed/performed           Past Medical History:  Diagnosis Date   Abnormal uterine bleeding    Acid reflux    Alpha thalassemia trait    Amenorrhea    Anemia    no current med.   Back pain    Cesarean delivery delivered 10/11/2019   10/09/2019 - primary CS for failed IOL   Chest pain    Complication of anesthesia    states had to keep giving her anesthesia during EGD   Constipation    Depression    "I'm good"   Finger mass, left 03/2017   middle finger   Gestational diabetes    Gestational diabetes 05/23/2019   Current Diabetic Medications:  None  _0  Aspirin 81 mg daily after 12 weeks (? A2/B GDM)  Required Referrals for A1GDM or A2GDM: _1  Diabetes Education and Testing Supplies _2  Nutrition Cousult  For A2/B GDM or higher classes of DM _3  Diabetes Education and Testing Supplies _4  Nutrition Counsult _5  Fetal ECHO after 20 weeks  _6  Eye exam for retina evaluation   Baseline and surveillance labs (   Gestational hypertension 09/23/2019   Guidelines for Antenatal Testing and Sonography  (with updated ICD-10 codes)  Updated   10-02-2019 with Dr. Tama High  INDICATION U/S 2 X week NST/AFI  or full BPP wkly DELIVERY Diabetes   A1 - good control - O24.410    A2 - good control - O24.419      A2  - poor control or poor compliance - O24.419, E11.65   (Macrosomia or polyhydramnios) **E11.65 is extra code for poor control**    A2/B - O24.   Headache    Hypertension    Infection    UTI   Irritable bowel syndrome (IBS)    Joint pain    Morbid obesity with body mass index (BMI) of 45.0 to 49.9 in adult [facility])    Obesity during pregnancy, antepartum 04/06/2019   Body mass index is 53.71 kg/m.  Recommendations _7  Aspirin 81 mg daily after 12 weeks; discontinue after 36 weeks _8  Nutrition consult _9  Weight gain 11-20 lbs for singleton and 25-35 lbs for twin pregnancy (IOM guidelines) Higher class of obesity patients recommended to gain closer to lower limit  Weight loss is associated with adverse outcomes _10  Baseline and surveillance labs (pulled in fr   Plantar fasciitis, bilateral    Polycystic ovary syndrome    Prediabetes    "prediabetes"   Pregnancy induced  hypertension    Shortness of breath    Sleep apnea    no CPAP use   Supervision of normal first pregnancy 04/06/2019   [name]: [ x]Initial [ x]12 _0 20 _1 28 _2 32 _3 36 _4 38 _5 39 _6 40 Nursing Staff Provider Office Location [facility]  Dating   LMP Language  English  Anatomy US  Nml Flu Vaccine  Declined 07/28/19 Genetic Screen  NIPS: low risks   AFP:   negative  TDaP vaccine   08/25/19 Hgb A1C or  GTT Early A1C 5.8 Third trimester: GDM insulin Rhogam  NA   LAB RESULTS  Feeding Plan Breast Blood Type   Vertigo 2017    Past Surgical History:  Procedure Laterality Date   CESAREAN SECTION N/A 10/09/2019   Procedure: CESAREAN SECTION;  Surgeon: Aletha Halim, MD;  Location: [facility];  Service: Obstetrics;  Laterality: N/A;   COLONOSCOPY WITH PROPOFOL  10/07/2016   ESOPHAGOGASTRODUODENOSCOPY  12/21/2015   EXCISION MASS UPPER  EXTREMETIES Left 04/21/2017   Procedure: EXCISION MASS LEFT MIDDLE FINGER;  Surgeon: Daryll Brod, MD;  Location: [facility];  Service: Orthopedics;  Laterality: Left;   IR FL GUIDED LOC OF NEEDLE/CATH TIP FOR SPINAL INJECTION RT  03/13/2020    There were no vitals filed for this visit.   Subjective Assessment - 08/23/20 1506    Subjective Now has ankle ASO for ankle sprain, Dr. Georgina Snell would like therapy to also address ankle.  Saw neurology today  who is going to order MRI; would like for pt to be further assessed for positional vertigo.    Pertinent History acid reflux, alpha thalassemia trait, anemia, back pain, depression, gestational diabetes, HA, HTN, UTI, IBS, obesity, bilat plantar fasciitis, OSA, vertigo    Limitations Walking;Standing;House hold activities    Diagnostic tests 02/21/2020 x-ray IMPRESSION:Negative cervical spine radiographs. head CT scan 03/09/2020 IMPRESSION:  Normal CT scan of the head without contrast    Patient Stated Goals To resolve the dizziness so I can get back to normal life.    Currently in Pain? Yes    Pain Location Head    Pain Descriptors / Indicators Headache    Pain Onset More than a month ago              [facility] PT Assessment - 08/23/20 1541      Palpation   Palpation comment Pt reporting tenderness to palpation over R medial and lateral malleoli.  Pain with passive supination and pronation and pain with active pronation and supination, DF and PF      Special Tests    Special Tests --               Vestibular Assessment - 08/23/20 1513      Positional Testing   Dix-Hallpike Dix-Hallpike Right;Dix-Hallpike Left    Horizontal Canal Testing Horizontal Canal Right;Horizontal Canal Left      Dix-Hallpike Right   Dix-Hallpike Right Duration 0    Dix-Hallpike Right Symptoms No nystagmus      Dix-Hallpike Left   Dix-Hallpike Left Duration 10    Dix-Hallpike Left Symptoms No nystagmus   but reports increase to 8/10 headache       Horizontal Canal Right   Horizontal Canal Right Duration 0    Horizontal Canal Right Symptoms Normal      Horizontal Canal Left   Horizontal Canal Left Duration 0    Horizontal Canal Left Symptoms Normal  [facility] PT Treatment/Exercise - 08/23/20 1543      Ambulation/Gait   Ambulation/Gait Yes    Ambulation/Gait Assistance 6: Modified independent (Device/Increase time)    Ambulation Distance (Feet) 115 Feet    Assistive device None    Gait Pattern Antalgic    Ambulation Surface Level;Indoor           Vestibular Treatment/Exercise - 08/23/20 1516      Vestibular Treatment/Exercise   Vestibular Treatment Provided Gaze    Gaze Exercises X1 Viewing Horizontal;X1 Viewing Vertical      X1 Viewing Horizontal   Foot Position seated without back support    Reps 2    Comments 30 seconds no symptoms, 45 seconds mild symptoms - wore light sensitivity glasses      X1 Viewing Vertical   Foot Position seated without back support    Reps 2    Comments 30 seconds, mild then moderate symptoms with glasses.  Symptoms settled quickly                 PT Education - 08/23/20 1545    Education Details reviewed x1 viewing; will add ankle exercises at next visit    Person(s) Educated Patient    Methods Explanation;Demonstration;Handout    Comprehension Verbalized understanding;Returned demonstration          Gaze Stabilization: Sitting    Perform in a dimly lit room.   Keeping eyes on target on wall 3 feet away, tilt head down 15-30 and move head side to side for 45 seconds.  Repeat while moving head up and down for 30 seconds. Do 2-3 sessions per day.      PT Short Term Goals - 08/23/20 1550      PT SHORT TERM GOAL #3   Title VESTIBULAR GOALS - Pt will complete vestibular assessment with MSQ, DVA and FGA.    Baseline have assessed MSQ; DVA not assessed and FGA not assessed due to ankle    Time 3    Period Weeks    Status Partially  Met    Target Date 08/24/20      PT SHORT TERM GOAL #4   Title Pt will demonstrate negative positional testing (roll test and dix-hallpike) for R and L sides to indicate resolution of BPPV.    Baseline Vertigo with R dix-hallpike    Time 3    Period Weeks    Status Achieved    Target Date 08/24/20      PT SHORT TERM GOAL #5   Title Pt will demonstrate independence with initial vestibular/balance HEP    Baseline no HEP to date    Period Weeks    Status Achieved    Target Date 08/24/20             PT Long Term Goals - 08/23/20 1552      PT LONG TERM GOAL #1   Title VESTIBULAR GOALS - Pt will demonstrate independence with final vestibular and balance HEP    Baseline no HEP to date    Time 6    Period Weeks    Status New    Target Date 09/17/20      PT LONG TERM GOAL #2   Title Pt will report 0/5 (no dizziness) on all movements of MSQ to indicate decreased motion sensitivity    Baseline mild to severe dizziness reported    Time 6    Period Weeks    Status New    Target Date 09/17/20  PT LONG TERM GOAL #3   Title Pt will demonstrate 1-2 line difference on DVA to indicate improved gaze stability    Baseline not assessed to date    Time 6    Period Weeks    Status New    Target Date 09/17/20      PT LONG TERM GOAL #4   Title Pt will demonstrate 4 point improvement in FGA to indicate decreased falls risk    Baseline Deferred due to ankle sprain    Status Deferred      PT LONG TERM GOAL #5   Title 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    Time 6    Period Weeks    Status New    Target Date 09/17/20                 Plan - 08/23/20 1532      Clinical Impression Statement Pt demonstrating resolution of R posterior canal BPPV.  Pt did report increase in HA after performing dix-hallpike assessment; allowed pt to utilize light sensitivity glasses and pt was able to perform x1 viewing with decreased symptoms of HA and dizziness.  Pt to continue with current HEP.  Performed general assessment of ankle pain and pain with active and passive ROM.  Will take more specific measurements next session and will add gentle AROM to HEP for R ankle.  Ankle impairments, treatment interventions, diagnosis and goals added to POC.  Will resent to physician for approval.    Personal Factors and Comorbidities Age;Comorbidity 3+;Finances;Fitness;Profession;Time since onset of injury/illness/exacerbation;Transportation    Comorbidities acid reflux, alpha thalassemia trait, anemia, back pain, depression, gestational diabetes, HA, HTN, UTI, IBS, obesity, bilat plantar fasciitis, OSA, vertigo    Examination-Activity Limitations Bed Mobility;Bend;Locomotion Level;Reach Overhead;Stairs    Examination-Participation Restrictions Community Activity;Driving;Occupation    Stability/Clinical Decision Making Evolving/Moderate complexity    Rehab Potential Good    PT Frequency 1x / week    PT Duration 6 weeks    PT Treatment/Interventions ADLs/Self Care Home Management;Canalith Repostioning;Gait training;Stair training;Functional mobility training;Therapeutic activities;Therapeutic exercise;Balance training;Neuromuscular re-education;Patient/family education;Vestibular;Visual/perceptual remediation/compensation;Cryotherapy;Moist Heat;Manual techniques;Passive range of motion    PT Next Visit Plan ankle added to POC.  Assess R ankle ROM; Give gentle ankle AROM exercises.  Assess DVA (don't worry about FGA due to ankle).  Progress x1 viewing if able - if she has a HA you can use my light sensitivity glasses.    Consulted and Agree with Plan of Care Patient           Patient  will benefit from skilled therapeutic intervention in order to improve the following deficits and impairments:  Decreased balance, Difficulty walking, Dizziness, Impaired vision/preception, Pain, Decreased range of motion, Decreased strength  Visit Diagnosis: Dizziness and giddiness  BPPV (benign paroxysmal positional vertigo), right  Unsteadiness on feet  Difficulty in walking, not elsewhere classified  Pain in right ankle and joints of right foot     Problem List Patient Active Problem List   Diagnosis Date Noted   Sprain of ankle 07/18/2020   Acute hypoxemic respiratory  failure due to COVID-19 [facility]) 06/11/2020   Hx of migraine headaches 04/09/2020   Postpartum care following cesarean delivery 11/22/2019   Contraceptive management 11/22/2019   Positive RPR test 10/09/2019   HTN (hypertension) 09/23/2019   Migraine headache 07/12/2019   Alpha thalassemia trait    Vitamin D deficiency 09/16/2017   Prediabetes 09/16/2017   Depression 09/16/2017   Class 3 severe obesity with serious comorbidity and body mass index (BMI) of 50.0 to 59.9 in adult [facility]) 09/16/2017   PCOS (polycystic ovarian syndrome) 08/13/2017   Mass 05/06/2017   Vertigo 10/06/2016   Microcytic anemia 09/26/2016   Pseudotumor cerebri syndrome 08/28/2016   Obesity hypoventilation syndrome ([place]) 08/28/2016   Benign paroxysmal positional vertigo due to bilateral vestibular disorder 08/28/2016   OSA (obstructive sleep apnea) 08/28/2016   Hypersomnia with sleep apnea 08/28/2016   Female hirsutism 08/28/2016   GERD (gastroesophageal reflux disease) 02/24/2015   Constipation 10/02/2014    Rico Junker, PT, DPT 08/23/20    3:57 PM    [facility] [address] [name], [place], [address] Phone: [phone]   Fax:  [phone]  Name: Maureen Lewis MRN: [id number] Date of Birth: 1996-03-27

## 2020-08-23 NOTE — Progress Notes (Signed)
.     Provider:  Larey Seat, M D  Referring Provider: Eloise Levels, NP at [facility],  Primary Care Physician:  Dorothyann Peng, NP    [place]   Chief Complaint  Patient presents with  . Follow-up    pt alone, rm 11. presents today as a follow up from a recent injury. 9/21 she had a fall and suffered a concusion. since then there has been increase in headaches and they are worse then headaches previously described, blurry vision, vertigo, cant sleep and pain behind the eyes. her sports medicine MD wanted her to f/u here to asess if there is anything else needed from neuro standpoint to help with these symptoms given the recent concussion and pseudotumor cerebri.   . Other    pt is already working with neuro rehab for vertigo therapy. she is still in the works of getting the new CPAP machine. there was a insurance hiccup which hindered her from starting but she will follow back up today to address.     HPI:  Maureen Lewis is a 24 y.o. AA female and mother of an 63 month old daughter, and  is seen for a new problem, on 08-23-2020  this time referred by Dr Georgina Snell, MD    The patient had undergone pseudotumor evaluation with an LP CSF opening pressure of only 16 cm water- deemed normal. Maureen Lewis reports that she fell on 07-10-20 when she slipped in a puddle outside her house and hit her head on the back. But already having headache and worsening headaches as known since spring of this year the headaches became more intense and more frequent to a daily headache since his fall and traumatic injury. She has been treated for concussion and postconcussion syndrome which had included blurry vision, vertigo insomnia and pain behind the eye. She has been seen by a sports medicine physician who wanted to be seen her to be seen here to make sure that this is not a neurologic additional concern given the concussion and the previous history of pseudotumor cerebri. As  mentioned above this time her spinal tap did not show a high pressure she is already working with neuro rehab for vertigo and she is still in the process of getting her CPAP machine renewed. I got a copy of her most recent home sleep test results to the end of today's visit note. She reports still having Vertigo-  There are multiple triggers. She feels a clockwise rotation and it can be a fast sensation or slower , but a rotation. She has felt insecure when she lifts her baby daughter. Feels off balance.  At the end of the sports medicine referral and PT evaluation with spot drop letter the patient was again diagnosed with vertigo and a postconcussive syndrome after a fall downstairs which actually happened later in September September 21.  The past medical history is positive for obesity, acid reflux disease, alpha thalassemia trait, anemia related, back pain related to 1, history of depression, gestational diabetes, headaches in the setting of pseudotumor cerebri or benign intracranial hypertension, hypertension and generalized OSA.  Recommended was also ophthalmology visit.       Her HST was repeated as her apnea is untreated and she needs a new CPAP :  Study Date: Mar 26, 2020 S/H/A Version: 001.001.001.001 / 4.1.1528 / 8  History:    Maureen Lewis presents now for a worsening of headaches, of the same quality but of higher  intensity than she had 3 years ago.  She had undergone a sleep study with [name] in October 2018 evaluating her risk factors of obesity, retro-orbital pressure headaches, dizzy spells, vertigo in correlation to her pseudotumor condition.  She tested positive for mild sleep apnea with an AHI of 8.4, the REM sleep was much more pronounced with an AHI of 39.6.  And supine AHI was 9.9 versus a non-supine sleep position AHI of 6/h.  There was no clinically significant hypoxemia or hypercapnia found and we recommended a CPAP titration at the time she returned for the CPAP titration in November 2018 a  CPAP at 9 cm water pressure was prescribed heated humidity and 2 cm EPR and she used a nasal pillow.  In 2019 she had again passing- out spells, headaches and high blood pressure and she was noncompliant with CPAP.   We had made in January 12, 2018 appointment but this did not take place.  She does not have her CPAP anymore.       Summary & Diagnosis:      Moderate sleep apnea at AHI 20.6/h and strongly REM sleep dependent with an AHI in REM sleep of 57.6/h  with no clinically relevant hypoxemia time.   Recommendations:     REM dependent sleep apnea - This strongly REM dependent Sleep Apnea Type needs Positive Airway pressure to be alleviated.  I suggest a setting for auto CPAP between 6 and 16 cm water, 2 cm  EPR, heated humidity and mask of patient's choice and comfort.    Interpreting Physician: Larey Seat, MD         02-23-2020; She had been seen here as a referral from [place] at [facility] for evaluation of dizziness and headaches 2017 and was followed in 2018 lst . In the meantime she had a baby girl, born 10-09-2019. She has dropped out of college for now.  Maureen Lewis presents now for a worsening of headaches, of the same quality but of higher intensity than she had 3 years ago.  She had undergone a sleep study with [name] in October 2018 evaluating her risk factors of obesity, retro-orbital pressure headaches, dizzy spells, vertigo in correlation to her pseudotumor condition.  She tested positive for mild sleep apnea with an AHI of 8.4, the REM sleep was much more pronounced with an AHI of 39.6.  And supine AHI was 9.9 versus a nonsupine sleep position AHI of 6/h.  There was no clinically significant hypoxemia or hypercapnia found and we recommended a CPAP titration at the time she returned for the CPAP titration in November 2018 a CPAP at 9 cm water pressure was prescribed heated humidity and 2 cm EPR and she used a nasal pillow.  In 2019 she had again passing out spells headaches  and high blood pressure but she was noncompliant with CPAP.  We had made in January 12, 2018 appointment but this did not take place.  She does not have her CPAP anymore(?).  She has not been on medication  And NP Nafziger just started her back on Topiramate after a 2 year hiatus she had been non compliant with medical advise.  HTN persisted, vertigo returned. Counterclockwise vertigo which could be elicited with rapid head movements at repeated head movements.  The pregnancy was hard on her- in the pandemic-and birth did increase the headaches in intensity and frequency. She has developed gestational diabetes and weight gain. c section delivery.      2017 CONSULTATION: Pseudotumor cerebri. Ms.  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 She has struggled with morbid obesity for the last 4 years, she has very irregular menstrual periods, and she had her first period in 2014. She went through puberty rather late. The weight gain precipitated the first menstrual cycle. She has always been tall but for the last for 5 years rather large. Her mother reports that even as a toddler when looking at growth charts her daughter exceeded height and weight for her age group by file. Recently both have changed some eating habits but thus far this has not led to a significant weight loss yet. Mr. Kerekes was also evaluated in a sleep study and diagnosed with obstructive sleep apnea but reports that treatment was not initiated as her primary care physician felt that she has to lose weight to treat the apnea. She is between a rock and a hard place as she is not able to just loose weight. She is also suspected to have polycystic ovarian syndrome and she has seen an endocrinologist shortly before turning 20 whose position was that he does not see minors. She was accompanied by her mother. She went all the way to [facility] for an endocrinology consultation which led not to any medical therapy at  this point. The endocrinologist had mentioned to her that she would like to see an ovarian ultrasound but to her knowledge none has been ordered.   Her reason for appointment today is that she experienced dizzy spells during which she develops a staggering gait feels off balance and a high fall risk. He started only in September the spells occurred suddenly it is not as if she could this anticipate them she doesn't really have an aura or warning. They have been daily occurrences for while. They last also for rest of the day until she goes to bed- many hours. She experienced vertigo, described in the room of surroundings spinning counterclockwise- Please note, she suffers from extreme ear wax buildup. At tmes the vertigo was less prominent and rather felt like lightheadedness, presyncope. She has fainted and fell downstairs, and she had completely blacked out. She lost awareness of her surroundings found herself waking up at the bottom of the staircase and may have well been out for several minutes.  Her fall has related to constant headaches that have not improved or worsened over the last weeks. For this reason I will also order an MRI. Nystagmus to the right, lightheadedness, at this time untreated sleep apnea according to the patient's verbal report about a sleep study obtained in August 2017. Most daily headaches since her fall, concussion? She reports a pressure behind the eye and in the temple associated with nausea, photophobia. She reports feeling tired and exhausted, the headaches also exhausted her.     Review of Systems: Out of a complete 14 system review, the patient complains of only the following symptoms, and all other reviewed systems are negative.   Nausea, photophobia and headaches. Vertigo since fall with concussion. Reports floaters and blind spots since the fall.     Social History   Socioeconomic History  . Marital status: Single    Spouse name: Not on file  . Number of  children: Not on file  . Years of education: Not on file  . Highest education level: Not on file  Occupational History  . Occupation: Chemical engineer: [id number]  Tobacco Use  . Smoking status: Never Smoker  . Smokeless tobacco:  Never Used  Vaping Use  . Vaping Use: Never used  Substance and Sexual Activity  . Alcohol use: Not Currently    Comment: social   . Drug use: No  . Sexual activity: Not Currently    Birth control/protection: None, Injection  Other Topics Concern  . Not on file  Social History Narrative   Right handed    Soda sometimes   Lives with mom and cousin, a grandmother in [place] also helps care for her.       She is in nursing school.    Social Determinants of Health   Financial Resource Strain:   . Difficulty of Paying Living Expenses: Not on file  Food Insecurity:   . Worried About Programme researcher, broadcasting/film/video in the Last Year: Not on file  . Ran Out of Food in the Last Year: Not on file  Transportation Needs:   . Lack of Transportation (Medical): Not on file  . Lack of Transportation (Non-Medical): Not on file  Physical Activity:   . Days of Exercise per Week: Not on file  . Minutes of Exercise per Session: Not on file  Stress:   . Feeling of Stress : Not on file  Social Connections:   . Frequency of Communication with Friends and Family: Not on file  . Frequency of Social Gatherings with Friends and Family: Not on file  . Attends Religious Services: Not on file  . Active Member of Clubs or Organizations: Not on file  . Attends Banker Meetings: Not on file  . Marital Status: Not on file  Intimate Partner Violence:   . Fear of Current or Ex-Partner: Not on file  . Emotionally Abused: Not on file  . Physically Abused: Not on file  . Sexually Abused: Not on file    Family History  Problem Relation Age of Onset  . Diabetes Maternal Aunt   . Hypertension Maternal Uncle   . Depression Maternal Grandfather   . Diabetes  Maternal Grandfather   . Hypertension Maternal Grandfather   . Arthritis Mother   . High blood pressure Mother   . Depression Mother   . Sleep apnea Mother   . Obesity Mother   . Diabetes Mother   . Hypertension Mother   . Kidney failure Mother   . Sleep apnea Father   . Obesity Father   . Healthy Daughter     Past Medical History:  Diagnosis Date  . Abnormal uterine bleeding   . Acid reflux   . Alpha thalassemia trait   . Amenorrhea   . Anemia    no current med.  . Back pain   . Cesarean delivery delivered 10/11/2019   10/09/2019 - primary CS for failed IOL  . Chest pain   . Complication of anesthesia    states had to keep giving her anesthesia during EGD  . Constipation   . Depression    "I'm good"  . Finger mass, left 03/2017   middle finger  . Gestational diabetes   . Gestational diabetes 05/23/2019   Current Diabetic Medications:  None  [ ]  Aspirin 81 mg daily after 12 weeks (? A2/B GDM)  Required Referrals for A1GDM or A2GDM: [ ]  Diabetes Education and Testing Supplies [ ]  Nutrition Cousult  For A2/B GDM or higher classes of DM [ ]  Diabetes Education and Testing Supplies [ ]  Nutrition Counsult [ ]  Fetal ECHO after 20 weeks  [ ]  Eye exam for retina evaluation   Baseline  and surveillance labs (  . Gestational hypertension 09/23/2019   Guidelines for Antenatal Testing and Sonography  (with updated ICD-10 codes)  Updated  10-02-19 with Dr. Tama High  INDICATION U/S 2 X week NST/AFI  or full BPP wkly DELIVERY Diabetes   A1 - good control - O24.410    A2 - good control - O24.419      A2  - poor control or poor compliance - O24.419, E11.65   (Macrosomia or polyhydramnios) **E11.65 is extra code for poor control**    A2/B - O24.  Marland Kitchen Headache   . Hypertension   . Infection    UTI  . Irritable bowel syndrome (IBS)   . Joint pain   . Morbid obesity with body mass index (BMI) of 45.0 to 49.9 in adult [facility])   . Obesity during pregnancy, antepartum 04/06/2019   Body mass index  is 53.71 kg/m.  Recommendations $RemoveBeforeDE'[x]'nbPJQHTgOzOrpej$  Aspirin 81 mg daily after 12 weeks; discontinue after 36 weeks $Remove'[ ]'WGdMQgm$  Nutrition consult $RemoveBeforeDEI'[ ]'IAkafhTuPofNJTZL$  Weight gain 11-20 lbs for singleton and 25-35 lbs for twin pregnancy (IOM guidelines) Higher class of obesity patients recommended to gain closer to lower limit  Weight loss is associated with adverse outcomes $RemoveBeforeDE'[ ]'QCWbQbOXbdJOciq$  Baseline and surveillance labs (pulled in fr  . Plantar fasciitis, bilateral   . Polycystic ovary syndrome   . Prediabetes    "prediabetes"  . Pregnancy induced hypertension   . Shortness of breath   . Sleep apnea    no CPAP use  . Supervision of normal first pregnancy 04/06/2019   [name]: [ x]Initial [ x]12 $Remo'[ ]'qdefg$ 20 $Remo'[ ]'OnPTd$ 28 $Remo'[ ]'aFltL$ 32 $Remo'[ ]'PbOxF$ 36 $Remo'[ ]'svpaJ$ 38 $Remo'[ ]'TXcjA$ 39 $Remo'[ ]'UwKxs$ 40 Nursing Staff Provider Office Location [facility]  Dating   LMP Language  English  Anatomy US  Nml Flu Vaccine  Declined 07/28/19 Genetic Screen  NIPS: low risks   AFP:   negative  TDaP vaccine   08/25/19 Hgb A1C or  GTT Early A1C 5.8 Third trimester: GDM insulin Rhogam  NA   LAB RESULTS  Feeding Plan Breast Blood Type  . Vertigo 2017    Past Surgical History:  Procedure Laterality Date  . CESAREAN SECTION N/A 10/09/2019   Procedure: CESAREAN SECTION;  Surgeon: Aletha Halim, MD;  Location: [facility];  Service: Obstetrics;  Laterality: N/A;  . COLONOSCOPY WITH PROPOFOL  10/07/2016  . ESOPHAGOGASTRODUODENOSCOPY  12/21/2015  . EXCISION MASS UPPER EXTREMETIES Left 04/21/2017   Procedure: EXCISION MASS LEFT MIDDLE FINGER;  Surgeon: Daryll Brod, MD;  Location: [facility];  Service: Orthopedics;  Laterality: Left;  . IR FL GUIDED LOC OF NEEDLE/CATH TIP FOR SPINAL INJECTION RT  03/13/2020    Current Outpatient Medications  Medication Sig Dispense Refill  . acetaZOLAMIDE (DIAMOX) 500 MG capsule Take 1 capsule (500 mg total) by mouth 2 (two) times daily. For headache 60 capsule 2  . albuterol (VENTOLIN HFA) 108 (90 Base) MCG/ACT inhaler Inhale 2 puffs into the lungs every 6 (six) hours as  needed for wheezing or shortness of breath. 8 g 0  . amitriptyline (ELAVIL) 25 MG tablet Take 1-2 tablets (25-50 mg total) by mouth at bedtime. 60 tablet 2  . amLODipine (NORVASC) 10 MG tablet Take 1 tablet (10 mg total) by mouth daily. 30 tablet 2  . baclofen (LIORESAL) 10 MG tablet Take 1 tablet (10 mg total) by mouth 2 (two) times daily. 30 each 0  . Blood Pressure Monitoring KIT 1 kit by Does not apply route once a  week. 1 kit 0  . diclofenac (VOLTAREN) 75 MG EC tablet Take 1 tablet (75 mg total) by mouth 2 (two) times daily. 60 tablet 1  . erythromycin ophthalmic ointment Place 1 application into the right eye 4 (four) times daily for 7 days. 28 g 0  . hydrochlorothiazide (HYDRODIURIL) 25 MG tablet Take 1 tablet (25 mg total) by mouth daily. 30 tablet 2  . loratadine (CLARITIN) 10 MG tablet Take 10 mg by mouth daily as needed for allergies.     . metFORMIN (GLUCOPHAGE-XR) 500 MG 24 hr tablet SMARTSIG:2 Tablet(s) By Mouth Morning-Evening    . pantoprazole (PROTONIX) 40 MG tablet Take 1 tablet (40 mg total) by mouth daily. 30 tablet 3  . polyethylene glycol powder (GLYCOLAX/MIRALAX) 17 GM/SCOOP powder Take 17 g by mouth daily as needed for moderate constipation.     . Semaglutide-Weight Management (WEGOVY) 0.5 MG/0.5ML SOAJ Inject 0.5 mg into the skin once a week. 2 mL 0  . sulfamethoxazole-trimethoprim (BACTRIM DS) 800-160 MG tablet Take 1 tablet by mouth 2 (two) times daily for 5 days. 10 tablet 0  . topiramate (TOPAMAX) 25 MG tablet Take 2 tablets (50 mg total) by mouth 2 (two) times daily. 180 tablet 1  . Vitamin D, Ergocalciferol, (DRISDOL) 1.25 MG (50000 UNIT) CAPS capsule Take 1 capsule (50,000 Units total) by mouth every 7 (seven) days. 4 capsule 0  . ferrous sulfate 325 (65 FE) MG tablet Take 1 tablet (325 mg total) by mouth every other day. 45 tablet 1   No current facility-administered medications for this visit.    Allergies as of 08/23/2020 - Review Complete 08/23/2020  Allergen  Reaction Noted  . Bee pollen Hives, Shortness Of Breath, and Swelling 12/04/2015  . Bee venom Hives, Shortness Of Breath, and Swelling 02/18/2019  . Hydrocodone-acetaminophen Anaphylaxis 04/29/2017  . Peach flavor Hives, Shortness Of Breath, and Other (See Comments) 04/14/2017  . Pollen extract Hives, Shortness Of Breath, and Swelling 12/04/2015  . Remdesivir Swelling 06/13/2020    Vitals: BP (!) 153/86 (BP Location: Left Arm, Patient Position:  Sitting, Cuff Size: Normal)   Pulse 89   Temp (!) 96.9 F (36.1 C)   Ht 6' 2.5" (1.892 m)   Wt (!) 432 lb 6.4 oz (196.1 kg)     BMI 54.77 kg/m  Last Weight:  Wt Readings from Last 1 Encounters:  08/23/20 (!) 455 lb (206.4 kg)   Last Height:   Ht Readings from Last 1 Encounters:  08/23/20 6' 2.5" (1.892 m)    Physical exam:  General: The patient is awake, alert and appears not in acute distress. Head: Normocephalic, atraumatic. Neck is supple. Mallampati plus size- narrow 3 plus.  neck circumference: 21" Cardiovascular:  Regular rate and rhythm , without  murmurs or carotid bruit, and without distended neck veins. Respiratory: Lungs are clear to auscultation. Skin:  Without evidence of edema, or rash.  Trunk: BMI is super - elevated ( 57) /  patient is seated with normal posture.   Neurologic exam : The patient is awake and alert, oriented to place and time. There is a normal attention span & concentration ability. Speech is fluent without dysarthria, dysphonia or aphasia. Mood and affect are appropriate.  Cranial nerves: no loss of smell or taste.  Pupils are equal and briskly reactive to light. Funduscopic exam - bilateral pallor, unclear if edema.   Extraocular movements  in  horizontal planes  with nystagmus.  Eye movements alone provoke a feeling of vertigo- ,  worsened with head rotation.  Visual fields by finger perimetry are intact. Hearing to finger rub intact.  Facial sensation intact to fine touch.  Facial motor  strength is symmetric and tongue and uvula move midline.  Tongue protrusion into either cheek is normal. Shoulder shrug is normal.   PS : Rapid head movements have again caused a nystagmus to the each side- not at endpoint but towards the center of vision- for the first time I appreciated a nystagmus with upward gaze, too. that was self-limited to 3 beats, and also provoke the vertigo as a sensation of a clockwise rotation of the surrounding area.    Motor exam:  Normal tone ,muscle bulk and symmetric  strength in all extremities. Still having good grip strength but left thenar eminence is atrophied, carpal tunnel.   Sensory:  Fine touch, pinprick and vibration were tested in all extremities.  Proprioception was normal, but. fingers and feet go numb, any time of the day. She described a carpal tunnel distribution for the sensation  Coordination: Rapid alternating movements in the fingers/hands were normal. Finger-to-nose maneuver  normal without evidence of ataxia, dysmetria and no  tremor. Gait and station: Patient walks without assistive device and is able unassisted to climb up to the exam table. Strength within normal limits.  Deep tendon reflexes: in the  upper and lower extremities are symmetric and intact. Babinski maneuver response is downgoing.   Assessment:  After physical and neurologic examination, review of laboratory studies, imaging, neurophysiology testing and pre-existing records, assessment is that of :   Postconcussion vertigo wit a different component of headaches since fall, TBI - needs MRI - Caveat weight limits(!)  Not likely benign intracranial hypertension related - but never started diamox before the fall , sports medicine refilled the medication and she started taking it.      Plan:  Treatment plan and additional workup :  Diamox to be continued,  MRI brain order for postconcusison TBI.   Continue with vestibular rehab at one outpatient rehab.   See your  ophthalmologist.    Larey Seat MD 08/23/2020

## 2020-08-23 NOTE — Telephone Encounter (Signed)
[employer] Josem Kaufmann: [id number] (exp. 08/23/20 to 10/21/20) [facility] pending uploaded notes

## 2020-08-23 NOTE — Patient Instructions (Signed)
Concussion, Adult  A concussion is a brain injury from a hard, direct hit (trauma) to your head or body. This direct hit causes the brain to quickly shake back and forth inside the skull. A concussion may also be called a mild traumatic brain injury (TBI). Healing from this injury can take time. What are the causes? This condition is caused by:  A direct hit to your head, such as: ? Running into a player during a game. ? Being hit in a fight. ? Hitting your head on a hard surface.  A quick and sudden movement (jolt) of the head or neck, such as in a car crash. What are the signs or symptoms? The signs of a concussion can be hard to notice. They may be missed by you, family members, and doctors. You may look fine on the outside but may not act or feel normal. Physical symptoms  Headaches.  Being tired (fatigued).  Being dizzy.  Problems with body balance.  Problems seeing or hearing.  Being sensitive to light or noise.  Feeling sick to your stomach (nausea) or throwing up (vomiting).  Not sleeping or eating as you used to.  Loss of feeling (numbness) or tingling in the body.  Seizure. Mental and emotional symptoms  Problems remembering things.  Trouble focusing your mind (concentrating), organizing, or making decisions.  Being slow to think, act, react, speak, or read.  Feeling grouchy (irritable).  Having mood changes.  Feeling worried or nervous (anxious).  Feeling sad (depressed). How is this treated? This condition may be treated by:  Stopping sports or activity if you are injured. If you hit your head or have signs of concussion: ? Do not return to sports or activities the same day. ? Get checked by a doctor before you return to your activities.  Resting your body and your mind.  Being watched carefully, often at home.  Medicines to help with symptoms such as: ? Feeling sick to your stomach. ? Headaches. ? Problems with sleep.  Avoid taking strong  pain medicines (opioids) for a concussion.  Avoiding alcohol and drugs.  Being asked to go to a concussion clinic or a place to help you recover (rehabilitation center). Recovery from a concussion can take time. Return to activities only:  When you are fully healed.  When your doctor says it is safe. Follow these instructions at home: Activity  Limit activities that need a lot of thought or focus, such as: ? Homework or work for your job. ? Watching TV. ? Using the computer or phone. ? Playing memory games and puzzles.  Rest. Rest helps your brain heal. Make sure you: ? Get plenty of sleep. Most adults should get 7-9 hours of sleep each night. ? Rest during the day. Take naps or breaks when you feel tired.  Avoid activity like exercise until your doctor says its safe. Stop any activity that makes symptoms worse.  Do not do activities that could cause a second concussion, such as riding a bike or playing sports.  Ask your doctor when you can return to your normal activities, such as school, work, sports, and driving. Your ability to react may be slower. Do not do these activities if you are dizzy. General instructions   Take over-the-counter and prescription medicines only as told by your doctor.  Do not drink alcohol until your doctor says you can.  Watch your symptoms and tell other people to do the same. Other problems can occur after a concussion. Older   adults have a higher risk of serious problems.  Tell your work Freight forwarder, teachers, Government social research officer, school counselor, coach, or Product/process development scientist about your injury and symptoms. Tell them about what you can or cannot do.  Keep all follow-up visits as told by your doctor. This is important. How is this prevented?  It is very important that you do not get another brain injury. In rare cases, another injury can cause brain damage that will not go away, brain swelling, or death. The risk of this is greatest in the first 7-10 days  after a head injury. To avoid injuries: ? Stop activities that could lead to a second concussion, such as contact sports, until your doctor says it is okay. ? When you return to sports or activities:  Do not crash into other players. This is how most concussions happen.  Follow the rules.  Respect other players. ? Get regular exercise. Do strength and balance training. ? Wear a helmet that fits you well during sports, biking, or other activities.  Helmets can help protect you from serious skull and brain injuries, but they do not protect you from a concussion. Even when wearing a helmet, you should avoid being hit in the head. Contact a doctor if:  Your symptoms get worse or they do not get better.  You have new symptoms.  You have another injury. Get help right away if:  You have bad headaches or your headaches get worse.  You feel weak or numb in any part of your body.  You are mixed up (confused).  Your balance gets worse.  You keep throwing up.  You feel more sleepy than normal.  Your speech is not clear (is slurred).  You cannot recognize people or places.  You have a seizure.  Others have trouble waking you up.  You have behavior changes.  You have changes in how you see (vision).  You pass out (lose consciousness). Summary  A concussion is a brain injury from a hard, direct hit (trauma) to your head or body.  This condition is treated with rest and careful watching of symptoms.  If you keep having symptoms, call your doctor. This information is not intended to replace advice given to you by your health care provider. Make sure you discuss any questions you have with your health care provider. Document Revised: 05/27/2018 Document Reviewed: 05/27/2018 Elsevier Patient Education  [place]. Vertigo Vertigo is the feeling that you or the things around you are moving when they are not. This feeling can come and go at any time. Vertigo often goes  away on its own. This condition can be dangerous if it happens when you are doing activities like driving or working with machines. Your doctor will do tests to find the cause of your vertigo. These tests will also help your doctor decide on the best treatment for you. Follow these instructions at home: Eating and drinking      Drink enough fluid to keep your pee (urine) pale yellow.  Do not drink alcohol. Activity  Return to your normal activities as told by your doctor. Ask your doctor what activities are safe for you.  In the morning, first sit up on the side of the bed. When you feel okay, stand slowly while you hold onto something until you know that your balance is fine.  Move slowly. Avoid sudden body or head movements or certain positions, as told by your doctor.  Use a cane if you have trouble  standing or walking.  Sit down right away if you feel dizzy.  Avoid doing any tasks or activities that can cause danger to you or others if you get dizzy.  Avoid bending down if you feel dizzy. Place items in your home so that they are easy for you to reach without leaning over.  Do not drive or use heavy machinery if you feel dizzy. General instructions  Take over-the-counter and prescription medicines only as told by your doctor.  Keep all follow-up visits as told by your doctor. This is important. Contact a doctor if:  Your medicine does not help your vertigo.  You have a fever.  Your problems get worse or you have new symptoms.  Your family or friends see changes in your behavior.  The feeling of being sick to your stomach gets worse.  Your vomiting gets worse.  You lose feeling (have numbness) in part of your body.  You feel prickling and tingling in a part of your body. Get help right away if:  You have trouble moving or talking.  You are always dizzy.  You pass out (faint).  You get very bad headaches.  You feel weak in your hands, arms, or  legs.  You have changes in your hearing.  You have changes in how you see (vision).  You get a stiff neck.  Bright light starts to bother you. Summary  Vertigo is the feeling that you or the things around you are moving when they are not.  Your doctor will do tests to find the cause of your vertigo.  You may be told to avoid some tasks, positions, or movements.  Contact a doctor if your medicine is not helping, or if you have a fever, new symptoms, or a change in behavior.  Get help right away if you get very bad headaches, or if you have changes in how you speak, hear, or see. This information is not intended to replace advice given to you by your health care provider. Make sure you discuss any questions you have with your health care provider. Document Revised: 08/30/2018 Document Reviewed: 08/30/2018 Elsevier Patient Education  2020 [employer].

## 2020-08-23 NOTE — Patient Instructions (Signed)
Gaze Stabilization: Sitting    Perform in a dimly lit room.   Keeping eyes on target on wall 3 feet away, tilt head down 15-30 and move head side to side for 45 seconds.  Repeat while moving head up and down for 30 seconds. Do 2-3 sessions per day.

## 2020-08-24 ENCOUNTER — Telehealth: Payer: Self-pay | Admitting: Neurology

## 2020-08-24 LAB — SEDIMENTATION RATE: Sed Rate: 25 mm/h — ABNORMAL HIGH (ref 0–20)

## 2020-08-24 LAB — PROTEIN ELECTROPHORESIS, SERUM, WITH REFLEX
Albumin ELP: 3.7 g/dL — ABNORMAL LOW (ref 3.8–4.8)
Alpha 1: 0.4 g/dL — ABNORMAL HIGH (ref 0.2–0.3)
Alpha 2: 0.9 g/dL (ref 0.5–0.9)
Beta 2: 0.4 g/dL (ref 0.2–0.5)
Beta Globulin: 0.5 g/dL (ref 0.4–0.6)
Gamma Globulin: 1.2 g/dL (ref 0.8–1.7)
Total Protein: 7.1 g/dL (ref 6.1–8.1)

## 2020-08-24 LAB — IGG, IGA, IGM
IgG (Immunoglobin G), Serum: 1354 mg/dL (ref 600–1640)
IgM, Serum: 92 mg/dL (ref 50–300)
Immunoglobulin A: 197 mg/dL (ref 47–310)

## 2020-08-24 LAB — RHEUMATOID FACTOR: Rheumatoid fact SerPl-aCnc: <14 IU/mL (ref <14)

## 2020-08-24 LAB — CYCLIC CITRUL PEPTIDE ANTIBODY, IGG: Cyclic Citrullin Peptide Ab: <16 UNITS

## 2020-08-24 NOTE — Telephone Encounter (Signed)
I will refer for eye exam to Dr. Katharine Look office. The MRI is also ordered at [facility] due to weight restrictions on our truck MRI.  I called the patient with the name of the eye doctors office. CD

## 2020-08-24 NOTE — Telephone Encounter (Signed)
Called and spoke to patient and relayed she will seeing Dr. Katy Fitch Telephone [phone]

## 2020-08-24 NOTE — Telephone Encounter (Signed)
Pt. Was calling to find out which eye doctor she is being referred to.

## 2020-08-24 NOTE — Telephone Encounter (Signed)
I will discuss results at the follow-up visit.

## 2020-08-27 ENCOUNTER — Other Ambulatory Visit: Payer: Self-pay

## 2020-08-27 ENCOUNTER — Encounter: Payer: Self-pay | Admitting: Rheumatology

## 2020-08-27 ENCOUNTER — Ambulatory Visit (INDEPENDENT_AMBULATORY_CARE_PROVIDER_SITE_OTHER): Payer: BC Managed Care – PPO | Admitting: Rheumatology

## 2020-08-27 VITALS — BP 159/103 | HR 103 | Resp 17 | Ht 74.5 in | Wt >= 6400 oz

## 2020-08-27 DIAGNOSIS — I10 Essential (primary) hypertension: Secondary | ICD-10-CM | POA: Diagnosis not present

## 2020-08-27 DIAGNOSIS — Z8719 Personal history of other diseases of the digestive system: Secondary | ICD-10-CM

## 2020-08-27 DIAGNOSIS — G4710 Hypersomnia, unspecified: Secondary | ICD-10-CM

## 2020-08-27 DIAGNOSIS — R768 Other specified abnormal immunological findings in serum: Secondary | ICD-10-CM | POA: Diagnosis not present

## 2020-08-27 DIAGNOSIS — E282 Polycystic ovarian syndrome: Secondary | ICD-10-CM

## 2020-08-27 DIAGNOSIS — J9601 Acute respiratory failure with hypoxia: Secondary | ICD-10-CM

## 2020-08-27 DIAGNOSIS — H8113 Benign paroxysmal vertigo, bilateral: Secondary | ICD-10-CM | POA: Diagnosis not present

## 2020-08-27 DIAGNOSIS — U071 COVID-19: Secondary | ICD-10-CM

## 2020-08-27 DIAGNOSIS — D509 Iron deficiency anemia, unspecified: Secondary | ICD-10-CM

## 2020-08-27 DIAGNOSIS — G932 Benign intracranial hypertension: Secondary | ICD-10-CM | POA: Diagnosis not present

## 2020-08-27 DIAGNOSIS — R7689 Other specified abnormal immunological findings in serum: Secondary | ICD-10-CM

## 2020-08-27 DIAGNOSIS — D563 Thalassemia minor: Secondary | ICD-10-CM

## 2020-08-27 DIAGNOSIS — M791 Myalgia, unspecified site: Secondary | ICD-10-CM

## 2020-08-27 DIAGNOSIS — Z8669 Personal history of other diseases of the nervous system and sense organs: Secondary | ICD-10-CM

## 2020-08-27 DIAGNOSIS — G4733 Obstructive sleep apnea (adult) (pediatric): Secondary | ICD-10-CM | POA: Diagnosis not present

## 2020-08-27 DIAGNOSIS — E662 Morbid (severe) obesity with alveolar hypoventilation: Secondary | ICD-10-CM | POA: Diagnosis not present

## 2020-08-27 DIAGNOSIS — G4730 Sleep apnea, unspecified: Secondary | ICD-10-CM

## 2020-08-27 DIAGNOSIS — R7303 Prediabetes: Secondary | ICD-10-CM | POA: Diagnosis not present

## 2020-08-27 DIAGNOSIS — M79641 Pain in right hand: Secondary | ICD-10-CM

## 2020-08-27 DIAGNOSIS — M79642 Pain in left hand: Secondary | ICD-10-CM

## 2020-08-27 DIAGNOSIS — L680 Hirsutism: Secondary | ICD-10-CM

## 2020-08-27 DIAGNOSIS — E559 Vitamin D deficiency, unspecified: Secondary | ICD-10-CM

## 2020-08-27 NOTE — Telephone Encounter (Signed)
Noted, mcd Healthy blue is pending right now.. she is scheduled at [facility] for 09/07/20.  Hinton Dyer just an FYI about Dr. Katy Fitch.

## 2020-08-27 NOTE — Telephone Encounter (Signed)
Referral sent to Dr. Katy Fitch . Patient is aware and has telephone number thanks Hinton Dyer

## 2020-08-28 ENCOUNTER — Ambulatory Visit: Payer: BC Managed Care – PPO

## 2020-08-28 ENCOUNTER — Other Ambulatory Visit: Payer: Self-pay

## 2020-08-28 DIAGNOSIS — M542 Cervicalgia: Secondary | ICD-10-CM | POA: Diagnosis not present

## 2020-08-28 DIAGNOSIS — R262 Difficulty in walking, not elsewhere classified: Secondary | ICD-10-CM

## 2020-08-28 DIAGNOSIS — M25571 Pain in right ankle and joints of right foot: Secondary | ICD-10-CM | POA: Diagnosis not present

## 2020-08-28 DIAGNOSIS — R42 Dizziness and giddiness: Secondary | ICD-10-CM | POA: Diagnosis not present

## 2020-08-28 DIAGNOSIS — R2681 Unsteadiness on feet: Secondary | ICD-10-CM | POA: Diagnosis not present

## 2020-08-28 DIAGNOSIS — H8111 Benign paroxysmal vertigo, right ear: Secondary | ICD-10-CM | POA: Diagnosis not present

## 2020-08-28 NOTE — Patient Instructions (Signed)
URL: [email or web address] Date: 08/28/2020 Prepared by: Baldomero Lamy  Exercises Ankle Inversion Eversion Towel Slide - 1 x daily - 5 x weekly - 2 sets - 10 reps Seated Ankle Alphabet - 1 x daily - 5 x weekly - 2 sets - 10 reps Supine Ankle Dorsiflexion and Plantarflexion AROM - 1 x daily - 5 x weekly - 2 sets - 10 reps Seated Gastroc Stretch with Strap - 1 x daily - 5 x weekly - 1 sets - 3 reps - 30 hold

## 2020-08-28 NOTE — Therapy (Signed)
[facility] [address] [place], [place], [address] Phone: [phone]   Fax:  [phone]  Physical Therapy Treatment  Patient Details  Name: Maureen Lewis MRN: [id number] Date of Birth: 1996-04-27 Referring Provider (PT): Gregor Hams, MD   Encounter Date: 08/28/2020   PT End of Session - 08/28/20 1708    Visit Number 4    Number of Visits 6   vestibular   Date for PT Re-Evaluation 09/16/20   vestibular   Authorization Type Medicaid Healthy Blue    Authorization Time Period requesting 6 visits, 1x/week x 6  (has used eval + 10 visits at [facility])    PT Start Time 1700    PT Stop Time 1743    PT Time Calculation (min) 43 min    Activity Tolerance Patient tolerated treatment well    Behavior During Therapy [facility] for tasks assessed/performed           Past Medical History:  Diagnosis Date  . Abnormal uterine bleeding   . Acid reflux   . Alpha thalassemia trait   . Amenorrhea   . Anemia    no current med.  . Back pain   . Cesarean delivery delivered 10/11/2019   10/09/2019 - primary CS for failed IOL  . Chest pain   . Complication of anesthesia    states had to keep giving her anesthesia during EGD  . Constipation   . Depression    "I'm good"  . Finger mass, left 03/2017   middle finger  . Gestational diabetes   . Gestational diabetes 05/23/2019   Current Diabetic Medications:  None  [ ]  Aspirin 81 mg daily after 12 weeks (? A2/B GDM)  Required Referrals for A1GDM or A2GDM: [ ]  Diabetes Education and Testing Supplies [ ]  Nutrition Cousult  For A2/B GDM or higher classes of DM [ ]  Diabetes Education and Testing Supplies [ ]  Nutrition Counsult [ ]  Fetal ECHO after 20 weeks  [ ]  Eye exam for retina evaluation   Baseline and surveillance labs (  . Gestational hypertension 09/23/2019   Guidelines for Antenatal Testing and Sonography  (with updated ICD-10 codes)  Updated  2019-09-30 with Dr. Tama High  INDICATION U/S 2 X week NST/AFI  or full BPP wkly DELIVERY Diabetes   A1 - good control - O24.410    A2 - good control - O24.419      A2  - poor control or poor compliance - O24.419, E11.65   (Macrosomia or polyhydramnios) **E11.65 is extra code for poor control**    A2/B - O24.  Marland Kitchen Headache   . Hypertension   . Infection    UTI  . Irritable bowel syndrome (IBS)   . Joint pain   . Morbid obesity with body mass index (BMI) of 45.0 to 49.9 in adult [facility])   . Obesity during pregnancy, antepartum 04/06/2019   Body mass index is 53.71 kg/m.  Recommendations [x]  Aspirin 81 mg daily after 12 weeks; discontinue after 36 weeks [ ]  Nutrition consult [ ]  Weight gain 11-20 lbs for singleton and 25-35 lbs for twin pregnancy (IOM guidelines) Higher class of obesity patients recommended to gain closer to lower limit  Weight loss is associated with adverse outcomes [ ]  Baseline and surveillance labs (pulled in fr  . Plantar fasciitis, bilateral   . Polycystic ovary syndrome   . Prediabetes    "prediabetes"  . Pregnancy induced hypertension   . Shortness  of breath   . Sleep apnea    no CPAP use  . Supervision of normal first pregnancy 04/06/2019   [name]: [ x]Initial [ x]12 $Remo'[ ]'VtgwB$ 20 $Remo'[ ]'meukV$ 28 $Remo'[ ]'Upszg$ 32 $Remo'[ ]'HquiR$ 36 $Remo'[ ]'cadjY$ 38 $Remo'[ ]'JmchX$ 39 $Remo'[ ]'IqxhV$ 40 Nursing Staff Provider Office Location [facility]  Dating   LMP Language  English  Anatomy US  Nml Flu Vaccine  Declined 07/28/19 Genetic Screen  NIPS: low risks   AFP:   negative  TDaP vaccine   08/25/19 Hgb A1C or  GTT Early A1C 5.8 Third trimester: GDM insulin Rhogam  NA   LAB RESULTS  Feeding Plan Breast Blood Type  . Vertigo 2017    Past Surgical History:  Procedure Laterality Date  . CESAREAN SECTION N/A 10/09/2019   Procedure: CESAREAN SECTION;  Surgeon: Aletha Halim, MD;  Location: [facility];  Service: Obstetrics;  Laterality: N/A;  . COLONOSCOPY WITH PROPOFOL  10/07/2016  . ESOPHAGOGASTRODUODENOSCOPY  12/21/2015  . EXCISION MASS UPPER EXTREMETIES Left 04/21/2017    Procedure: EXCISION MASS LEFT MIDDLE FINGER;  Surgeon: Daryll Brod, MD;  Location: [facility];  Service: Orthopedics;  Laterality: Left;  . IR FL GUIDED LOC OF NEEDLE/CATH TIP FOR SPINAL INJECTION RT  03/13/2020    There were no vitals filed for this visit.   Subjective Assessment - 08/28/20 1704    Subjective Patient reports been having intermittent headache. Has been staying with the lights off when she needed. Reports she has good/bad days. Reports she is icing the ankle intermittently.    Pertinent History acid reflux, alpha thalassemia trait, anemia, back pain, depression, gestational diabetes, HA, HTN, UTI, IBS, obesity, bilat plantar fasciitis, OSA, vertigo    Limitations Walking;Standing;House hold activities    Diagnostic tests 02/21/2020 x-ray IMPRESSION:Negative cervical spine radiographs. head CT scan 03/09/2020 IMPRESSION:  Normal CT scan of the head without contrast    Patient Stated Goals To resolve the dizziness so I can get back to normal life.    Currently in Pain? Yes    Pain Score 3     Pain Location Ankle    Pain Orientation Right    Pain Descriptors / Indicators Aching;Throbbing    Pain Type Acute pain    Pain Onset More than a month ago              [facility] PT Assessment - 08/28/20 1710      AROM   Overall AROM  Deficits    AROM Assessment Site Ankle    Right/Left Ankle Right    Right Ankle Dorsiflexion 4   pain reported at mid range to end range   Right Ankle Plantar Flexion 39    Right Ankle Inversion 17    Right Ankle Eversion 13   pain with end range     Palpation   Palpation comment After reports of pain with vertical x 1, PT assessed area of pain. increased muscle tightness noted in suboccipitals.                Vestibular Assessment - 08/28/20 0001      Visual Acuity   Static 6    Dynamic 4   increased dizziness (4/10), increased HA, and blurred vision                   [facility] Adult PT Treatment/Exercise - 08/28/20  0001      Ambulation/Gait   Ambulation/Gait Yes    Ambulation/Gait Assistance 6: Modified independent (Device/Increase time)  Ambulation Distance (Feet) --   clinic distances   Assistive device None    Gait Pattern Antalgic    Ambulation Surface Level;Indoor      Exercises   Exercises Other Exercises    Other Exercises  Removed ASO and completed the following exercises as additions to HEP to focus on ankle AROM. Completed gastro stretch 2 x 30 secs to patient tolerance. Completed seated ankle DF/PF x 10 reps, ankle alphabelt x 10 reps. Also compelted ankle inversion/eversion x 10 reps, verbal cues to avoid IR/ER of hip/knee and isolate motion at ankle. established initial HEP focused on the following exercises.            Vestibular Treatment/Exercise - 08/28/20 0001      Vestibular Treatment/Exercise   Vestibular Treatment Provided Gaze    Gaze Exercises X1 Viewing Horizontal;X1 Viewing Vertical      X1 Viewing Horizontal   Foot Position seated without back support    Reps 2    Comments 30 secs; mild symptoms resolved quickly; light sensitivty glasses on      X1 Viewing Vertical   Foot Position seated without back support    Reps 2    Comments 30 secs; reports pain in posterior neck with completion; mild symptoms resolved fairly quickly. completed with light senstivity glasses on            URL: [email or web address] Date: 08/28/2020 Prepared by: Baldomero Lamy  Exercises Ankle Inversion Eversion Towel Slide - 1 x daily - 5 x weekly - 2 sets - 10 reps Seated Ankle Alphabet - 1 x daily - 5 x weekly - 2 sets - 10 reps Supine Ankle Dorsiflexion and Plantarflexion AROM - 1 x daily - 5 x weekly - 2 sets - 10 reps Seated Gastroc Stretch with Strap - 1 x daily - 5 x weekly - 1 sets - 3 reps - 30 hold       PT Education - 08/28/20 1839    Education Details Ankle Additions    Person(s) Educated Patient    Methods Explanation;Demonstration;Handout     Comprehension Verbalized understanding;Returned demonstration            PT Short Term Goals - 08/23/20 1550      PT SHORT TERM GOAL #3   Title VESTIBULAR GOALS - Pt will complete vestibular assessment with MSQ, DVA and FGA.    Baseline have assessed MSQ; DVA not assessed and FGA not assessed due to ankle    Time 3    Period Weeks    Status Partially Met    Target Date 08/24/20      PT SHORT TERM GOAL #4   Title Pt will demonstrate negative positional testing (roll test and dix-hallpike) for R and L sides to indicate resolution of BPPV.    Baseline Vertigo with R dix-hallpike    Time 3    Period Weeks    Status Achieved    Target Date 08/24/20      PT SHORT TERM GOAL #5   Title Pt will demonstrate independence with initial vestibular/balance HEP    Baseline no HEP to date    Period Weeks    Status Achieved    Target Date 08/24/20             PT Long Term Goals - 08/23/20 1552      PT LONG TERM GOAL #1   Title VESTIBULAR GOALS - Pt will demonstrate independence with final vestibular and balance HEP  Baseline no HEP to date    Time 6    Period Weeks    Status New    Target Date 09/17/20      PT LONG TERM GOAL #2   Title Pt will report 0/5 (no dizziness) on all movements of MSQ to indicate decreased motion sensitivity    Baseline mild to severe dizziness reported    Time 6    Period Weeks    Status New    Target Date 09/17/20      PT LONG TERM GOAL #3   Title Pt will demonstrate 1-2 line difference on DVA to indicate improved gaze stability    Baseline not assessed to date    Time 6    Period Weeks    Status New    Target Date 09/17/20      PT LONG TERM GOAL #4   Title Pt will demonstrate 4 point improvement in FGA to indicate decreased falls risk    Baseline Deferred due to ankle sprain    Status Deferred      PT LONG TERM GOAL #5   Title 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    Time 6    Period Weeks    Status New    Target Date 09/17/20                 Plan - 08/28/20 1839    Clinical Impression Statement Today's skilled PT session included further assessment of ankle AROM, with patient demonstrating most limitations in DF and eversion at this time. Spent time establishing initial HEP focused on gentle AROM/stretching for the R ankle as tolerated by patient. Continued progression of VOR x 1 with light sensitivity glasses, patient able to tolerate approx 30 seconds before increase in symptoms. Patient did report pain in posterior neck near suboccipital region with completion of vertical, increased muscle tension noted upon palpation.    Personal Factors and Comorbidities Age;Comorbidity 3+;Finances;Fitness;Profession;Time since onset of injury/illness/exacerbation;Transportation    Comorbidities acid reflux, alpha thalassemia trait, anemia, back pain, depression, gestational diabetes, HA, HTN, UTI, IBS, obesity, bilat plantar fasciitis, OSA, vertigo    Examination-Activity Limitations Bed Mobility;Bend;Locomotion Level;Reach Overhead;Stairs    Examination-Participation Restrictions Community Activity;Driving;Occupation    Stability/Clinical Decision Making Evolving/Moderate complexity    Rehab Potential Good    PT Frequency 1x / week    PT Duration 6 weeks    PT Treatment/Interventions ADLs/Self Care Home Management;Canalith Repostioning;Gait training;Stair training;Functional mobility training;Therapeutic activities;Therapeutic exercise;Balance training;Neuromuscular re-education;Patient/family education;Vestibular;Visual/perceptual remediation/compensation;Cryotherapy;Moist Heat;Manual techniques;Passive  range of motion    PT Next Visit Plan How was ankle exercises? continue addressing anke ROM/strength. manual therapy/dry needling to neck.  Progress x1 viewing if able - if she has a HA you can use my light sensitivity glasses.    Consulted and Agree with Plan of Care Patient           Patient will benefit from skilled therapeutic intervention in order to improve the following deficits and impairments:  Decreased balance, Difficulty walking, Dizziness, Impaired vision/preception, Pain, Decreased range of motion, Decreased strength  Visit Diagnosis: Dizziness and giddiness  Pain in right ankle and joints of right foot  Cervicalgia  Difficulty in walking, not elsewhere classified     Problem List Patient Active Problem List   Diagnosis Date Noted  . Sprain of ankle 07/18/2020  . Acute hypoxemic respiratory failure due to COVID-19 ([place]) 06/11/2020  . Hx of migraine headaches 04/09/2020  . Postpartum care following cesarean delivery 11/22/2019  . Contraceptive management 11/22/2019  . Positive RPR test 10/09/2019  . HTN (hypertension) 09/23/2019  . Migraine headache 07/12/2019  . Alpha thalassemia trait   . Vitamin D deficiency 09/16/2017  . Prediabetes 09/16/2017  . Depression 09/16/2017  . Class 3 severe obesity with serious comorbidity and body mass index (BMI) of 50.0 to 59.9 in adult ([place]) 09/16/2017  . PCOS (polycystic ovarian syndrome) 08/13/2017  . Mass 05/06/2017  . Vertigo 10/06/2016  . Microcytic anemia 09/26/2016  . Pseudotumor cerebri syndrome 08/28/2016  . Obesity hypoventilation syndrome ([place]) 08/28/2016  . Benign paroxysmal positional vertigo due to bilateral vestibular disorder 08/28/2016  . OSA (obstructive sleep apnea) 08/28/2016  . Hypersomnia with sleep apnea 08/28/2016  . Female hirsutism 08/28/2016  . GERD (gastroesophageal reflux disease) 02/24/2015  . Constipation 10/02/2014    Jones Bales, PT, DPT 08/28/2020, 6:42 PM  [facility] [address] [place], [place], [address] Phone: [phone]   Fax:  [phone]  Name: Maureen Lewis MRN: [id number] Date of Birth: 29-Sep-1996

## 2020-09-03 NOTE — Telephone Encounter (Signed)
i have an authorization for her Primary insurance but with [facility] they will not apporve it until a EOB is submitted from her primary insurance. i called the primary insurance and they stated they can't do a EOB until afterthe services have been done. so i guess after she has had the serivces then a EOB can be submited to [facility] .

## 2020-09-05 ENCOUNTER — Encounter (INDEPENDENT_AMBULATORY_CARE_PROVIDER_SITE_OTHER): Payer: Self-pay

## 2020-09-05 NOTE — Telephone Encounter (Signed)
Working on auth, spoke w/ pt

## 2020-09-06 ENCOUNTER — Ambulatory Visit: Payer: BC Managed Care – PPO | Admitting: Physical Therapy

## 2020-09-06 ENCOUNTER — Ambulatory Visit: Payer: BC Managed Care – PPO | Admitting: Adult Health

## 2020-09-07 ENCOUNTER — Ambulatory Visit
Admission: RE | Admit: 2020-09-07 | Discharge: 2020-09-07 | Disposition: A | Discharge status: Home / Self Care | Payer: BC Managed Care – PPO | Source: Ambulatory Visit | Attending: Neurology | Admitting: Neurology

## 2020-09-07 ENCOUNTER — Ambulatory Visit (INDEPENDENT_AMBULATORY_CARE_PROVIDER_SITE_OTHER): Payer: BC Managed Care – PPO

## 2020-09-07 ENCOUNTER — Ambulatory Visit
Admission: RE | Admit: 2020-09-07 | Discharge: 2020-09-07 | Disposition: A | Discharge status: Home / Self Care | Payer: Medicaid Other | Source: Ambulatory Visit | Attending: Family Medicine | Admitting: Family Medicine

## 2020-09-07 ENCOUNTER — Encounter: Payer: Self-pay | Admitting: Internal Medicine

## 2020-09-07 ENCOUNTER — Ambulatory Visit (INDEPENDENT_AMBULATORY_CARE_PROVIDER_SITE_OTHER): Payer: BC Managed Care – PPO | Admitting: Internal Medicine

## 2020-09-07 ENCOUNTER — Other Ambulatory Visit: Payer: Self-pay

## 2020-09-07 VITALS — BP 138/86 | HR 93 | Temp 98.6°F | Ht 74.5 in | Wt >= 6400 oz

## 2020-09-07 DIAGNOSIS — G932 Benign intracranial hypertension: Secondary | ICD-10-CM | POA: Diagnosis not present

## 2020-09-07 DIAGNOSIS — R412 Retrograde amnesia: Secondary | ICD-10-CM | POA: Diagnosis not present

## 2020-09-07 DIAGNOSIS — R0602 Shortness of breath: Secondary | ICD-10-CM

## 2020-09-07 DIAGNOSIS — J189 Pneumonia, unspecified organism: Secondary | ICD-10-CM | POA: Diagnosis not present

## 2020-09-07 DIAGNOSIS — S069X0D Unspecified intracranial injury without loss of consciousness, subsequent encounter: Secondary | ICD-10-CM

## 2020-09-07 DIAGNOSIS — G4733 Obstructive sleep apnea (adult) (pediatric): Secondary | ICD-10-CM | POA: Diagnosis not present

## 2020-09-07 DIAGNOSIS — S8254XA Nondisplaced fracture of medial malleolus of right tibia, initial encounter for closed fracture: Secondary | ICD-10-CM | POA: Diagnosis not present

## 2020-09-07 DIAGNOSIS — Z6841 Body Mass Index (BMI) 40.0 and over, adult: Secondary | ICD-10-CM

## 2020-09-07 DIAGNOSIS — S8264XA Nondisplaced fracture of lateral malleolus of right fibula, initial encounter for closed fracture: Secondary | ICD-10-CM | POA: Diagnosis not present

## 2020-09-07 DIAGNOSIS — Z23 Encounter for immunization: Secondary | ICD-10-CM | POA: Diagnosis not present

## 2020-09-07 DIAGNOSIS — Z8616 Personal history of COVID-19: Secondary | ICD-10-CM | POA: Diagnosis not present

## 2020-09-07 DIAGNOSIS — E66813 Obesity, class 3: Secondary | ICD-10-CM

## 2020-09-07 DIAGNOSIS — S93401S Sprain of unspecified ligament of right ankle, sequela: Secondary | ICD-10-CM

## 2020-09-07 MED ORDER — GADOBENATE DIMEGLUMINE 529 MG/ML IV SOLN
20.0000 mL | Freq: Once | INTRAVENOUS | Status: AC | PRN
  Administered 2020-09-07: 20 mL via INTRAVENOUS

## 2020-09-07 MED ORDER — AZITHROMYCIN 250 MG PO TABS: ORAL_TABLET | ORAL | 0 refills | Status: DC

## 2020-09-07 NOTE — Progress Notes (Signed)
Chief Complaint  Patient presents with  . Follow-up    having SOB extreme at night.  had sleep study waiting on C-pap machine    HPI: Maureen Lewis 24 y.o. come in for SDA PCP APPT NA?   breathing never right since covid infection in August ( had o2 requiring   resp failure)  [facility]  Early oct   Clear   ( ct prev  infiltrates cw covid)  But    recently seems worse in  in past week or 2  No cough fever    Syncope. Has osa waiting for cpa  Breathing off at night  positional  But also in  Day  Inhaler no help .   In day walks 6- 10 steps before winded    hh of 6  All age groups  No one sick x mom  Bacteremia? From dialysis  Infection?  She isin rehab  Had concussion and  Le injury sprain.    ROS: See pertinent positives and negatives per HPI. No new edema weight the same   No cp no cough fever   Pulse ox at home ok    95 and above  No bleeding   syncope  Past Medical History:  Diagnosis Date  . Abnormal uterine bleeding   . Acid reflux   . Alpha thalassemia trait   . Amenorrhea   . Anemia    no current med.  . Back pain   . Cesarean delivery delivered 10/11/2019   10/09/2019 - primary CS for failed IOL  . Chest pain   . Complication of anesthesia    states had to keep giving her anesthesia during EGD  . Constipation   . Depression    "I'm good"  . Finger mass, left 03/2017   middle finger  . Gestational diabetes   . Gestational diabetes 05/23/2019   Current Diabetic Medications:  None  [ ]  Aspirin 81 mg daily after 12 weeks (? A2/B GDM)  Required Referrals for A1GDM or A2GDM: [ ]  Diabetes Education and Testing Supplies [ ]  Nutrition Cousult  For A2/B GDM or higher classes of DM [ ]  Diabetes Education and Testing Supplies [ ]  Nutrition Counsult [ ]  Fetal ECHO after 20 weeks  [ ]  Eye exam for retina evaluation   Baseline and surveillance labs (  . Gestational hypertension 09/23/2019   Guidelines for Antenatal Testing and Sonography  (with updated ICD-10 codes)   Updated  09/10/19 with Dr. Tama High  INDICATION U/S 2 X week NST/AFI  or full BPP wkly DELIVERY Diabetes   A1 - good control - O24.410    A2 - good control - O24.419      A2  - poor control or poor compliance - O24.419, E11.65   (Macrosomia or polyhydramnios) **E11.65 is extra code for poor control**    A2/B - O24.  Marland Kitchen Headache   . Hypertension   . Infection    UTI  . Irritable bowel syndrome (IBS)   . Joint pain   . Morbid obesity with body mass index (BMI) of 45.0 to 49.9 in adult [facility])   . Obesity during pregnancy, antepartum 04/06/2019   Body mass index is 53.71 kg/m.  Recommendations [x]  Aspirin 81 mg daily after 12 weeks; discontinue after 36 weeks [ ]  Nutrition consult [ ]  Weight gain 11-20 lbs for singleton and 25-35 lbs for twin pregnancy (IOM guidelines) Higher class of obesity patients recommended to gain closer to lower  limit  Weight loss is associated with adverse outcomes $RemoveBeforeDE'[ ]'[id number]$  Baseline and surveillance labs (pulled in fr  . Plantar fasciitis, bilateral   . Polycystic ovary syndrome   . Prediabetes    "prediabetes"  . Pregnancy induced hypertension   . Shortness of breath   . Sleep apnea    no CPAP use  . Supervision of normal first pregnancy 04/06/2019   [name]: [ x]Initial [ x]12 $Remo'[ ]'OgMgO$ 20 $Remo'[ ]'nxDfP$ 28 $Remo'[ ]'WeDaU$ 32 $Remo'[ ]'ClDnW$ 36 $Remo'[ ]'ZdgkI$ 38 $Remo'[ ]'NVdyP$ 39 $Remo'[ ]'MMYBJ$ 40 Nursing Staff Provider Office Location [facility]  Dating   LMP Language  English  Anatomy US  Nml Flu Vaccine  Declined 07/28/19 Genetic Screen  NIPS: low risks   AFP:   negative  TDaP vaccine   08/25/19 Hgb A1C or  GTT Early A1C 5.8 Third trimester: GDM insulin Rhogam  NA   LAB RESULTS  Feeding Plan Breast Blood Type  . Vertigo 2017    Family History  Problem Relation Age of Onset  . Diabetes Maternal Aunt   . Hypertension Maternal Uncle   . Depression Maternal Grandfather   . Diabetes Maternal Grandfather   . Hypertension Maternal Grandfather   . Arthritis Mother   . High blood pressure Mother   . Depression Mother   . Sleep apnea  Mother   . Obesity Mother   . Diabetes Mother   . Hypertension Mother   . Kidney failure Mother   . Sleep apnea Father   . Obesity Father   . Healthy Daughter     Social History   Socioeconomic History  . Marital status: Single    Spouse name: Not on file  . Number of children: Not on file  . Years of education: Not on file  . Highest education level: Not on file  Occupational History  . Occupation: Chemical engineer: [id number]  Tobacco Use  . Smoking status: Never Smoker  . Smokeless tobacco: Never Used  Vaping Use  . Vaping Use: Never used  Substance and Sexual Activity  . Alcohol use: Not Currently    Comment: social   . Drug use: No  . Sexual activity: Not Currently    Birth control/protection: None, Injection  Other Topics Concern  . Not on file  Social History Narrative   Right handed    Soda sometimes   Lives with mom and cousin, a grandmother in [place] also helps care for her.       She is in nursing school.    Social Determinants of Health   Financial Resource Strain:   . Difficulty of Paying Living Expenses: Not on file  Food Insecurity:   . Worried About Charity fundraiser in the Last Year: Not on file  . Ran Out of Food in the Last Year: Not on file  Transportation Needs:   . Lack of Transportation (Medical): Not on file  . Lack of Transportation (Non-Medical): Not on file  Physical Activity:   . Days of Exercise per Week: Not on file  . Minutes of Exercise per Session: Not on file  Stress:   . Feeling of Stress : Not on file  Social Connections:   . Frequency of Communication with Friends and Family: Not on file  . Frequency of Social Gatherings with Friends and Family: Not on file  . Attends Religious Services: Not on file  . Active Member of Clubs or Organizations: Not on file  . Attends Archivist Meetings: Not on file  .  Marital Status: Not on file    Outpatient Medications Prior to Visit  Medication Sig  Dispense Refill  . acetaZOLAMIDE (DIAMOX) 500 MG capsule Take 1 capsule (500 mg total) by mouth 2 (two) times daily. For headache 60 capsule 2  . albuterol (VENTOLIN HFA) 108 (90 Base) MCG/ACT inhaler Inhale 2 puffs into the lungs every 6 (six) hours as needed for wheezing or shortness of breath. 8 g 0  . amLODipine (NORVASC) 10 MG tablet Take 1 tablet (10 mg total) by mouth daily. 30 tablet 2  . baclofen (LIORESAL) 10 MG tablet Take 1 tablet (10 mg total) by mouth 2 (two) times daily. 30 each 0  . Blood Pressure Monitoring KIT 1 kit by Does not apply route once a week. 1 kit 0  . diclofenac (VOLTAREN) 75 MG EC tablet Take 1 tablet (75 mg total) by mouth 2 (two) times daily. 60 tablet 1  . hydrochlorothiazide (HYDRODIURIL) 25 MG tablet Take 1 tablet (25 mg total) by mouth daily. 30 tablet 2  . loratadine (CLARITIN) 10 MG tablet Take 10 mg by mouth daily as needed for allergies.     Marland Kitchen nortriptyline (PAMELOR) 25 MG capsule Take 25 mg by mouth 2 (two) times daily.    . pantoprazole (PROTONIX) 40 MG tablet Take 1 tablet (40 mg total) by mouth daily. 30 tablet 3  . polyethylene glycol powder (GLYCOLAX/MIRALAX) 17 GM/SCOOP powder Take 17 g by mouth daily as needed for moderate constipation.     . Semaglutide-Weight Management (WEGOVY) 0.5 MG/0.5ML SOAJ Inject 0.5 mg into the skin once a week. 2 mL 0  . topiramate (TOPAMAX) 25 MG tablet Take 2 tablets (50 mg total) by mouth 2 (two) times daily. 180 tablet 1  . Vitamin D, Ergocalciferol, (DRISDOL) 1.25 MG (50000 UNIT) CAPS capsule Take 1 capsule (50,000 Units total) by mouth every 7 (seven) days. 4 capsule 0  . amitriptyline (ELAVIL) 25 MG tablet Take 1-2 tablets (25-50 mg total) by mouth at bedtime. (Patient not taking: Reported on 08/27/2020) 60 tablet 2  . ferrous sulfate 325 (65 FE) MG tablet Take 1 tablet (325 mg total) by mouth every other day. 45 tablet 1  . metFORMIN (GLUCOPHAGE-XR) 500 MG 24 hr tablet SMARTSIG:2 Tablet(s) By Mouth Morning-Evening  (Patient not taking: Reported on 09/07/2020)     No facility-administered medications prior to visit.     EXAM:  BP 138/86   Pulse 93   Temp 98.6 F (37 C) (Oral)   Ht 6' 2.5" (1.892 m)   Wt (!) 459 lb (208.2 kg)   SpO2 98%   BMI 58.14 kg/m   Body mass index is 58.14 kg/m. Wt Readings from Last 3 Encounters:  09/07/20 (!) 459 lb (208.2 kg)  08/27/20 (!) 451 lb (204.6 kg)  08/23/20 (!) 455 lb (206.4 kg)    GENERAL: vitals reviewed and listed above, alert, oriented, appears well hydrated and in no acute distress nl speech  No resp distress at rest  HEENT: atraumatic, conjunctiva  clear, no obvious abnormalities on inspection of external nose and ears OP :masked  NECK: no obvious masses on inspection palpation  Short large neck  LUNGS: clear to auscultation bilaterally, no wheezes, rales or rhonchi, CV: HRRR, no clubbing cyanosis or  peripheral edema nl cap refill  No obv jvd abd no masses noted  No obv fluid wave  MS: moves all extremities without noticeable focal  Abnormality r ankle in ankle support  PSYCH: pleasant and cooperative, no obvious depression  or anxiety Lab Results  Component Value Date   WBC 7.8 06/26/2020   HGB 12.6 06/26/2020   HCT 39.4 06/26/2020   PLT 296 06/26/2020   GLUCOSE 102 (H) 06/26/2020   CHOL 156 04/09/2020   TRIG 53 06/11/2020   HDL 42 04/09/2020   LDLCALC 101 (H) 04/09/2020   ALT 19 06/26/2020   AST 15 06/26/2020   NA 141 06/26/2020   K 4.1 06/26/2020   CL 109 06/26/2020   CREATININE 0.71 06/26/2020   BUN 12 06/26/2020   CO2 23 06/26/2020   TSH 1.270 04/09/2020   HGBA1C 6.0 (H) 04/09/2020   MICROALBUR <0.2 02/23/2015   BP Readings from Last 3 Encounters:  09/07/20 138/86  08/27/20 (!) 159/103  08/23/20 122/78    ASSESSMENT AND PLAN:  Discussed the following assessment and plan:  Shortness of breath - Plan: DG Chest 2 View, ECHOCARDIOGRAM COMPLETE  History of COVID-19 - august  had 2 pfizer  vaccine oct5 and 29th  - Plan:  DG Chest 2 View, ECHOCARDIOGRAM COMPLETE  Morbid obesity ([place]) - Plan: ECHOCARDIOGRAM COMPLETE  OSA (obstructive sleep apnea) - waiting for cpap machine intervnetion  Need for influenza vaccination - Plan: Flu Vaccine QUAD 36+ mos IM, CANCELED: Flu Vaccine QUAD 36+ mos IM Multiple factors  Can contribute today   No obv event  Sx of pe .   Nl oxygenation Plan x ray today  And echocardiogram and fu withj PCp CN made aware   Covid   Vaccine  Series was given less than 2 mos  post serious covid.   Infection   -Patient advised to return or notify health care team  if  new concerns arise.  Patient Instructions  Get chest x ray today  We will order a heart echo cardiogram to get more information .  About your  Make fyu appt with Georgina Snell in  The next few weeks after all done or as needed.     Shortness of Breath, Adult Shortness of breath is when a person has trouble breathing enough air or when a person feels like she or he is having trouble breathing in enough air. Shortness of breath could be a sign of a medical problem. Follow these instructions at home:   Pay attention to any changes in your symptoms.  Do not use any products that contain nicotine or tobacco, such as cigarettes, e-cigarettes, and chewing tobacco.  Do not smoke. Smoking is a common cause of shortness of breath. If you need help quitting, ask your health care provider.  Avoid things that can irritate your airways, such as: ? Mold. ? Dust. ? Air pollution. ? Chemical fumes. ? Things that can cause allergy symptoms (allergens), if you have allergies.  Keep your living space clean and free of mold and dust.  Rest as needed. Slowly return to your usual activities.  Take over-the-counter and prescription medicines only as told by your health care provider. This includes oxygen therapy and inhaled medicines.  Keep all follow-up visits as told by your health care provider. This is important. Contact a health care  provider if:  Your condition does not improve as soon as expected.  You have a hard time doing your normal activities, even after you rest.  You have new symptoms. Get help right away if:  Your shortness of breath gets worse.  You have shortness of breath when you are resting.  You feel light-headed or you faint.  You have a cough that is not controlled  with medicines.  You cough up blood.  You have pain with breathing.  You have pain in your chest, arms, shoulders, or abdomen.  You have a fever.  You cannot walk up stairs or exercise the way that you normally do. These symptoms may represent a serious problem that is an emergency. Do not wait to see if the symptoms will go away. Get medical help right away. Call your local emergency services (911 in the [place] Do not drive yourself to the hospital. Summary  Shortness of breath is when a person has trouble breathing enough air. It can be a sign of a medical problem.  Avoid things that irritate your lungs, such as smoking, pollution, mold, and dust.  Pay attention to changes in your symptoms and contact your health care provider if you have a hard time completing daily activities because of shortness of breath. This information is not intended to replace advice given to you by your health care provider. Make sure you discuss any questions you have with your health care provider. Document Revised: 03/08/2018 Document Reviewed: 03/08/2018 Elsevier Patient Education  2020 [place] Regine Christian M.D.

## 2020-09-07 NOTE — Progress Notes (Signed)
So radiologist  reports that  you could have pneumonia?   This is  not typical to not have fever and  cough but since a change   lets add antibiotic  fo now and I will let  Tommi Rumps be aware ( to ed  if sob gets bad over weekend)

## 2020-09-07 NOTE — Patient Instructions (Signed)
Get chest x ray today  We will order a heart echo cardiogram to get more information .  About your  Make fyu appt with Georgina Snell in  The next few weeks after all done or as needed.     Shortness of Breath, Adult Shortness of breath is when a person has trouble breathing enough air or when a person feels like she or he is having trouble breathing in enough air. Shortness of breath could be a sign of a medical problem. Follow these instructions at home:   Pay attention to any changes in your symptoms.  Do not use any products that contain nicotine or tobacco, such as cigarettes, e-cigarettes, and chewing tobacco.  Do not smoke. Smoking is a common cause of shortness of breath. If you need help quitting, ask your health care provider.  Avoid things that can irritate your airways, such as: ? Mold. ? Dust. ? Air pollution. ? Chemical fumes. ? Things that can cause allergy symptoms (allergens), if you have allergies.  Keep your living space clean and free of mold and dust.  Rest as needed. Slowly return to your usual activities.  Take over-the-counter and prescription medicines only as told by your health care provider. This includes oxygen therapy and inhaled medicines.  Keep all follow-up visits as told by your health care provider. This is important. Contact a health care provider if:  Your condition does not improve as soon as expected.  You have a hard time doing your normal activities, even after you rest.  You have new symptoms. Get help right away if:  Your shortness of breath gets worse.  You have shortness of breath when you are resting.  You feel light-headed or you faint.  You have a cough that is not controlled with medicines.  You cough up blood.  You have pain with breathing.  You have pain in your chest, arms, shoulders, or abdomen.  You have a fever.  You cannot walk up stairs or exercise the way that you normally do. These symptoms may represent a  serious problem that is an emergency. Do not wait to see if the symptoms will go away. Get medical help right away. Call your local emergency services (911 in the [place] Do not drive yourself to the hospital. Summary  Shortness of breath is when a person has trouble breathing enough air. It can be a sign of a medical problem.  Avoid things that irritate your lungs, such as smoking, pollution, mold, and dust.  Pay attention to changes in your symptoms and contact your health care provider if you have a hard time completing daily activities because of shortness of breath. This information is not intended to replace advice given to you by your health care provider. Make sure you discuss any questions you have with your health care provider. Document Revised: 03/08/2018 Document Reviewed: 03/08/2018 Elsevier Patient Education  [place].

## 2020-09-08 NOTE — Progress Notes (Signed)
DR Felecia Shelling commented on sinus congestion, mucous retention in maxillary sinus and ethmoidal cells, more on the left.  Normal brain, no bleed and no empty sella ( no sign of increased fluid pressure)  Would Maureen Lewis like to return to her primary care physician or a referral to ENT ?

## 2020-09-10 ENCOUNTER — Telehealth: Payer: Self-pay | Admitting: *Deleted

## 2020-09-10 ENCOUNTER — Ambulatory Visit: Payer: BC Managed Care – PPO | Admitting: Physical Therapy

## 2020-09-10 NOTE — Telephone Encounter (Signed)
-----   Message from Larey Seat, MD sent at 09/08/2020  5:02 PM EST ----- DR Felecia Shelling commented on sinus congestion, mucous retention in maxillary sinus and ethmoidal cells, more on the left.  Normal brain, no bleed and no empty sella ( no sign of increased fluid pressure)  Would Maureen Lewis like to return to her primary care physician or a referral to ENT ?

## 2020-09-10 NOTE — Telephone Encounter (Signed)
I spoke to the patient and she verbalized understanding of the MRI brain results. She will follow up with her PCP concerning the sinus congestion and mucous retention.

## 2020-09-11 ENCOUNTER — Other Ambulatory Visit (HOSPITAL_COMMUNITY)
Admission: RE | Admit: 2020-09-11 | Discharge: 2020-09-11 | Disposition: A | Discharge status: Home / Self Care | Payer: BC Managed Care – PPO | Source: Ambulatory Visit | Attending: Obstetrics | Admitting: Obstetrics

## 2020-09-11 ENCOUNTER — Ambulatory Visit (INDEPENDENT_AMBULATORY_CARE_PROVIDER_SITE_OTHER): Payer: BC Managed Care – PPO | Admitting: Obstetrics

## 2020-09-11 ENCOUNTER — Other Ambulatory Visit: Payer: Self-pay

## 2020-09-11 ENCOUNTER — Encounter: Payer: Self-pay | Admitting: Obstetrics

## 2020-09-11 VITALS — BP 153/87 | HR 104 | Ht 74.0 in | Wt >= 6400 oz

## 2020-09-11 DIAGNOSIS — R1030 Lower abdominal pain, unspecified: Secondary | ICD-10-CM

## 2020-09-11 DIAGNOSIS — N898 Other specified noninflammatory disorders of vagina: Secondary | ICD-10-CM | POA: Diagnosis not present

## 2020-09-11 DIAGNOSIS — Z6841 Body Mass Index (BMI) 40.0 and over, adult: Secondary | ICD-10-CM

## 2020-09-11 DIAGNOSIS — N912 Amenorrhea, unspecified: Secondary | ICD-10-CM

## 2020-09-11 LAB — POCT URINE PREGNANCY: Preg Test, Ur: NEGATIVE

## 2020-09-11 LAB — POCT URINALYSIS DIPSTICK
Bilirubin, UA: NEGATIVE
Blood, UA: NEGATIVE
Glucose, UA: NEGATIVE
Ketones, UA: NEGATIVE
Leukocytes, UA: NEGATIVE
Nitrite, UA: NEGATIVE
Protein, UA: NEGATIVE
Spec Grav, UA: 1.015 (ref 1.010–1.025)
Urobilinogen, UA: 0.2 E.U./dL
pH, UA: 5 (ref 5.0–8.0)

## 2020-09-11 MED ORDER — IBUPROFEN 800 MG PO TABS: 800.0000 mg | ORAL_TABLET | Freq: Three times a day (TID) | ORAL | 5 refills | Status: DC | PRN

## 2020-09-11 NOTE — Progress Notes (Signed)
X-ray shows a small fracture at the back of the ankle.  This was not visible on x-ray.  Schedule follow-up appointment with me in the near future to review findings.  Continue cam walker boot for now.

## 2020-09-11 NOTE — Progress Notes (Signed)
Patient ID: Maureen Lewis, female   DOB: [birth year], 24 y.o.   MRN: [id number]  Chief Complaint  Patient presents with  . Abdominal Pain    HPI Maureen Lewis is a 24 y.o. female.  Complains of lower abdominal pain for the last several months, worse over the last 2-3 weeks.  Has had some increase in vaginal discharge and irritation.  She discontinued Depo in April 2021, and has not had a period yet.  Negative subjective signs of pregnancy. HPI  Past Medical History:  Diagnosis Date  . Abnormal uterine bleeding   . Acid reflux   . Alpha thalassemia trait   . Amenorrhea   . Anemia    no current med.  . Back pain   . Cesarean delivery delivered 10/11/2019   10/09/2019 - primary CS for failed IOL  . Chest pain   . Complication of anesthesia    states had to keep giving her anesthesia during EGD  . Constipation   . Depression    "I'm good"  . Finger mass, left 03/2017   middle finger  . Gestational diabetes   . Gestational diabetes 05/23/2019   Current Diabetic Medications:  None  _0  Aspirin 81 mg daily after 12 weeks (? A2/B GDM)  Required Referrals for A1GDM or A2GDM: _1  Diabetes Education and Testing Supplies _2  Nutrition Cousult  For A2/B GDM or higher classes of DM _3  Diabetes Education and Testing Supplies _4  Nutrition Counsult _5  Fetal ECHO after 20 weeks  _6  Eye exam for retina evaluation   Baseline and surveillance labs (  . Gestational hypertension 09/23/2019   Guidelines for Antenatal Testing and Sonography  (with updated ICD-10 codes)  Updated  09/21/19 with Dr. Tama High  INDICATION U/S 2 X week NST/AFI  or full BPP wkly DELIVERY Diabetes   A1 - good control - O24.410    A2 - good control - O24.419      A2  - poor control or poor compliance - O24.419, E11.65   (Macrosomia or polyhydramnios) **E11.65 is extra code for poor control**    A2/B - O24.  Marland Kitchen Headache   . Hypertension   . Infection    UTI  . Irritable bowel syndrome (IBS)   . Joint  pain   . Morbid obesity with body mass index (BMI) of 45.0 to 49.9 in adult [facility])   . Obesity during pregnancy, antepartum 04/06/2019   Body mass index is 53.71 kg/m.  Recommendations _7  Aspirin 81 mg daily after 12 weeks; discontinue after 36 weeks _8  Nutrition consult _9  Weight gain 11-20 lbs for singleton and 25-35 lbs for twin pregnancy (IOM guidelines) Higher class of obesity patients recommended to gain closer to lower limit  Weight loss is associated with adverse outcomes _10  Baseline and surveillance labs (pulled in fr  . Plantar fasciitis, bilateral   . Polycystic ovary syndrome   . Prediabetes    "prediabetes"  . Pregnancy induced hypertension   . Shortness of breath   . Sleep apnea    no CPAP use  . Supervision of normal first pregnancy 04/06/2019   [name]: [ x]Initial [ x]12 _11 20 _12 28 _13 32 _14 36 _15 38 _16 39 _17 40 Nursing Staff Provider Office Location [facility]  Dating   LMP Language  English  Anatomy US  Nml Flu Vaccine  Declined 07/28/19 Genetic Screen  NIPS: low risks   AFP:  negative  TDaP vaccine   08/25/19 Hgb A1C or  GTT Early A1C 5.8 Third trimester: GDM insulin Rhogam  NA   LAB RESULTS  Feeding Plan Breast Blood Type  . Vertigo 2017    Past Surgical History:  Procedure Laterality Date  . CESAREAN SECTION N/A 10/09/2019   Procedure: CESAREAN SECTION;  Surgeon: Aletha Halim, MD;  Location: [facility];  Service: Obstetrics;  Laterality: N/A;  . COLONOSCOPY WITH PROPOFOL  10/07/2016  . ESOPHAGOGASTRODUODENOSCOPY  12/21/2015  . EXCISION MASS UPPER EXTREMETIES Left 04/21/2017   Procedure: EXCISION MASS LEFT MIDDLE FINGER;  Surgeon: Daryll Brod, MD;  Location: [place];  Service: Orthopedics;  Laterality: Left;  . IR FL GUIDED LOC OF NEEDLE/CATH TIP FOR SPINAL INJECTION RT  03/13/2020    Family History  Problem Relation Age of Onset  . Diabetes Maternal Aunt   . Hypertension Maternal Uncle   . Depression Maternal Grandfather   .  Diabetes Maternal Grandfather   . Hypertension Maternal Grandfather   . Arthritis Mother   . High blood pressure Mother   . Depression Mother   . Sleep apnea Mother   . Obesity Mother   . Diabetes Mother   . Hypertension Mother   . Kidney failure Mother   . Sleep apnea Father   . Obesity Father   . Healthy Daughter     Social History Social History   Tobacco Use  . Smoking status: Never Smoker  . Smokeless tobacco: Never Used  Vaping Use  . Vaping Use: Never used  Substance Use Topics  . Alcohol use: Not Currently    Comment: social   . Drug use: No    Allergies  Allergen Reactions  . Bee Pollen Hives, Shortness Of Breath and Swelling  . Bee Venom Hives, Shortness Of Breath and Swelling  . Hydrocodone-Acetaminophen Anaphylaxis    No problem when she takes Tylenol  . Peach Flavor Hives, Shortness Of Breath and Other (See Comments)    SWELLING OF MOUTH  . Pollen Extract Hives, Shortness Of Breath and Swelling  . Remdesivir Swelling    Angioedema 8/25    Current Outpatient Medications  Medication Sig Dispense Refill  . acetaZOLAMIDE (DIAMOX) 500 MG capsule Take 1 capsule (500 mg total) by mouth 2 (two) times daily. For headache 60 capsule 2  . albuterol (VENTOLIN HFA) 108 (90 Base) MCG/ACT inhaler Inhale 2 puffs into the lungs every 6 (six) hours as needed for wheezing or shortness of breath. 8 g 0  . amLODipine (NORVASC) 10 MG tablet Take 1 tablet (10 mg total) by mouth daily. 30 tablet 2  . azithromycin (ZITHROMAX Z-PAK) 250 MG tablet Take 2 po first day, then 1 po qd 6 tablet 0  . baclofen (LIORESAL) 10 MG tablet Take 1 tablet (10 mg total) by mouth 2 (two) times daily. 30 each 0  . Blood Pressure Monitoring KIT 1 kit by Does not apply route once a week. 1 kit 0  . diclofenac (VOLTAREN) 75 MG EC tablet Take 1 tablet (75 mg total) by mouth 2 (two) times daily. 60 tablet 1  . hydrochlorothiazide (HYDRODIURIL) 25 MG tablet Take 1 tablet (25 mg total) by mouth daily. 30  tablet 2  . loratadine (CLARITIN) 10 MG tablet Take 10 mg by mouth daily as needed for allergies.     Marland Kitchen nortriptyline (PAMELOR) 25 MG capsule Take 25 mg by mouth 2 (two) times daily.    . pantoprazole (PROTONIX) 40 MG tablet Take  1 tablet (40 mg total) by mouth daily. 30 tablet 3  . polyethylene glycol powder (GLYCOLAX/MIRALAX) 17 GM/SCOOP powder Take 17 g by mouth daily as needed for moderate constipation.     . Semaglutide-Weight Management (WEGOVY) 0.5 MG/0.5ML SOAJ Inject 0.5 mg into the skin once a week. 2 mL 0  . topiramate (TOPAMAX) 25 MG tablet Take 2 tablets (50 mg total) by mouth 2 (two) times daily. 180 tablet 1  . Vitamin D, Ergocalciferol, (DRISDOL) 1.25 MG (50000 UNIT) CAPS capsule Take 1 capsule (50,000 Units total) by mouth every 7 (seven) days. 4 capsule 0  . amitriptyline (ELAVIL) 25 MG tablet Take 1-2 tablets (25-50 mg total) by mouth at bedtime. (Patient not taking: Reported on 08/27/2020) 60 tablet 2  . ferrous sulfate 325 (65 FE) MG tablet Take 1 tablet (325 mg total) by mouth every other day. 45 tablet 1  . ibuprofen (ADVIL) 800 MG tablet Take 1 tablet (800 mg total) by mouth every 8 (eight) hours as needed. 30 tablet 5  . metFORMIN (GLUCOPHAGE-XR) 500 MG 24 hr tablet SMARTSIG:2 Tablet(s) By Mouth Morning-Evening (Patient not taking: Reported on 09/07/2020)     No current facility-administered medications for this visit.    Review of Systems Review of Systems Constitutional: negative for fatigue and weight loss Respiratory: negative for cough and wheezing Cardiovascular: negative for chest pain, fatigue and palpitations Gastrointestinal: positive for abdominal pain and negative for change in bowel habits Genitourinary:positive for no period since April 2021 Integument/breast: negative for nipple discharge Musculoskeletal:negative for myalgias Neurological: negative for gait problems and tremors Behavioral/Psych: negative for abusive relationship, depression Endocrine:  negative for temperature intolerance      Blood pressure (!) 153/87, pulse (!) 104, height _0  (1.88 m), weight (!) 462 lb (209.6 kg), not currently breastfeeding.  Physical Exam Physical Exam           General: Alert and no distress Abdomen:  normal findings: no organomegaly, soft, non-tender and no hernia  Pelvis:  External genitalia: normal general appearance Urinary system: urethral meatus normal and bladder without fullness, nontender Vaginal: normal without tenderness, induration or masses Cervix: normal appearance Adnexa: left adnexal tenderness, could not appreciate masses due to obesity Uterus: could not appreciate size and characteristics due to obesity    50% of 15 min visit spent on counseling and coordination of care.   Data Reviewed UPT:  Negative Urine Dip: Negative  Assessment     1. Lower abdominal pain Rx: - POCT urine pregnancy - POCT Urinalysis Dipstick - US PELVIC COMPLETE WITH TRANSVAGINAL; Future - ibuprofen (ADVIL) 800 MG tablet; Take 1 tablet (800 mg total) by mouth every 8 (eight) hours as needed.  Dispense: 30 tablet; Refill: 5  2. Vaginal discharge Rx: - Cervicovaginal ancillary only( [place])  3. Amenorrhea Rx: - TSH - Prolactin  4. Class 3 severe obesity without serious comorbidity and body mass index (BMI) of 50.0 to 59.9 in adult [facility]) - program of caloric reduction, exercise and behavioral modification recommended  Plan   Follow up in 2 weeks  Orders Placed This Encounter  Procedures  . US PELVIC COMPLETE WITH TRANSVAGINAL    Standing Status:   Future    Standing Expiration Date:   09/11/2021    Order Specific Question:   Reason for Exam (SYMPTOM  OR DIAGNOSIS REQUIRED)    Answer:   Pelvic pain - Left lower quadrant    Order Specific Question:   Preferred imaging location?    Answer:  [facility] Ultrasound  . CBC  . Comprehensive metabolic panel  . TSH  . Prolactin  . POCT urine pregnancy  . POCT Urinalysis Dipstick    Meds ordered this encounter  Medications  . ibuprofen (ADVIL) 800 MG tablet    Sig: Take 1 tablet (800 mg total) by mouth every 8 (eight) hours as needed.    Dispense:  30 tablet    Refill:  5     Shelly Bombard, MD 09/11/2020 3:32 PM

## 2020-09-11 NOTE — Progress Notes (Signed)
Pt stopped Depo in April 2021- has not had period since- has not been sexually active  Pt states she has vaginal irritation and abdominal pain. Pt denies UTI symptoms  Last pap- 04/11/20- negative

## 2020-09-12 ENCOUNTER — Other Ambulatory Visit: Payer: BC Managed Care – PPO

## 2020-09-12 LAB — CERVICOVAGINAL ANCILLARY ONLY
Bacterial Vaginitis (gardnerella): NEGATIVE
Candida Glabrata: NEGATIVE
Candida Vaginitis: NEGATIVE
Chlamydia: NEGATIVE
Comment: NEGATIVE
Comment: NEGATIVE
Comment: NEGATIVE
Comment: NEGATIVE
Comment: NEGATIVE
Comment: NORMAL
Neisseria Gonorrhea: NEGATIVE
Trichomonas: NEGATIVE

## 2020-09-13 LAB — TSH: TSH: 0.832 u[IU]/mL (ref 0.450–4.500)

## 2020-09-13 LAB — PROLACTIN: Prolactin: 31.8 ng/mL — ABNORMAL HIGH (ref 4.8–23.3)

## 2020-09-17 ENCOUNTER — Ambulatory Visit: Payer: BC Managed Care – PPO | Admitting: Physical Therapy

## 2020-09-17 NOTE — Progress Notes (Signed)
I, Wendy Poet, LAT, ATC, am serving as scribe for Dr. [name].  Maureen Lewis is a 24 y.o. female who presents to [place] at [facility] today for f/u R ankle pain after injuring her ankle when she fell down some stairs on 07/10/20. Pt was last seen by Dr. Georgina Snell on 08/16/20 and was advised to obtain a MRI to evaluate for possible tendon tear. Pt is here today to review her R ankle MRI.  Since her last visit, pt reports that her R ankle remains about the same.  She con't to ice and elevate and has been wearing her R ankle walking boot.  Dx imaging: 09/07/20 R ankle MRI          08/07/20 R ankle XR            07/10/20 R ankle XR  Pertinent review of systems: No fevers or chills  Relevant historical information: Obesity.  Unable to work because of ankle pain.   Exam:  BP (!) 148/86 (BP Location: Right Arm, Patient Position: Sitting, Cuff Size: Large)   Pulse 94   Ht 6\' 2"  (1.88 m)   Wt (!) 473 lb 9.6 oz (214.8 kg)   LMP  (LMP Unknown)   SpO2 97%   BMI 60.81 kg/m  General: Well Developed, well nourished, and in no acute distress.   MSK: Right ankle decreased motion.    Lab and Radiology Results EXAM: MRI OF THE RIGHT ANKLE WITHOUT CONTRAST  TECHNIQUE: Multiplanar, multisequence MR imaging of the ankle was performed. No intravenous contrast was administered.  COMPARISON:  None.  FINDINGS: TENDONS  Peroneal: Peroneal longus tendon intact. Peroneal brevis intact.  Posteromedial: Posterior tibial tendon intact. Flexor hallucis longus tendon intact. Flexor digitorum longus tendon intact.  Anterior: Tibialis anterior tendon intact. Extensor hallucis longus tendon intact Extensor digitorum longus tendon intact.  Achilles:  Intact.  Plantar Fascia: Intact.  LIGAMENTS  Lateral: Anterior talofibular ligament intact. Calcaneofibular ligament intact. Posterior talofibular ligament intact. Anterior and posterior tibiofibular  ligaments intact.  Medial: Deltoid ligament intact. Spring ligament intact.  CARTILAGE  Ankle Joint: No joint effusion. Normal ankle mortise. No chondral defect.  Subtalar Joints/Sinus Tarsi: Normal subtalar joints. No subtalar joint effusion. Normal sinus tarsi.  Bones: Subacute nondisplaced fracture of the posterior malleolus of the distal tibia with mild surrounding marrow edema. No acute fracture or dislocation.  Soft Tissue: No fluid collection or hematoma. Muscles are normal without edema or atrophy. Tarsal tunnel is normal.  IMPRESSION: 1. Subacute nondisplaced fracture of the posterior malleolus of the distal tibia with mild surrounding marrow edema.   Electronically Signed   By: Kathreen Devoid   On: 09/10/2020 15:23 I, [name], personally (independently) visualized and performed the interpretation of the images attached in this note.     Assessment and Plan: 24 y.o. female with right ankle pain due to subacute posterior malleolus fracture.  In retrospect this occurred during her injury/fall that has been the source of her pain.  However this fracture was not visible on x-ray.  Discussed options.  Plan for continued cam walker boot given her continued pain.  Recheck in a month.  Will remain out of work until then.  In 1 month she will be about 12 weeks into the fracture management.  May consider repeat imaging at that point.   PDMP not reviewed this encounter. No orders of the defined types were placed in this encounter.  No orders of the defined types were  placed in this encounter.    Discussed warning signs or symptoms. Please see discharge instructions. Patient expresses understanding.   The above documentation has been reviewed and is accurate and complete [name], M.D.

## 2020-09-18 ENCOUNTER — Encounter: Payer: Self-pay | Admitting: Family Medicine

## 2020-09-18 ENCOUNTER — Ambulatory Visit (INDEPENDENT_AMBULATORY_CARE_PROVIDER_SITE_OTHER): Payer: BC Managed Care – PPO | Admitting: Family Medicine

## 2020-09-18 ENCOUNTER — Other Ambulatory Visit: Payer: Self-pay

## 2020-09-18 VITALS — BP 148/86 | HR 94 | Ht 74.0 in | Wt >= 6400 oz

## 2020-09-18 DIAGNOSIS — S82891A Other fracture of right lower leg, initial encounter for closed fracture: Secondary | ICD-10-CM | POA: Insufficient documentation

## 2020-09-18 NOTE — Patient Instructions (Signed)
Thank you for coming in today.  Plan to continue the boot for another month.  Recheck in about 1 month.  Return sooner if needed.

## 2020-09-24 ENCOUNTER — Ambulatory Visit
Admission: RE | Admit: 2020-09-24 | Discharge: 2020-09-24 | Disposition: A | Discharge status: Home / Self Care | Payer: BC Managed Care – PPO | Source: Ambulatory Visit | Attending: Obstetrics | Admitting: Obstetrics

## 2020-09-24 ENCOUNTER — Other Ambulatory Visit: Payer: Self-pay

## 2020-09-24 DIAGNOSIS — R1030 Lower abdominal pain, unspecified: Secondary | ICD-10-CM

## 2020-09-24 DIAGNOSIS — E282 Polycystic ovarian syndrome: Secondary | ICD-10-CM | POA: Diagnosis not present

## 2020-09-24 DIAGNOSIS — R1032 Left lower quadrant pain: Secondary | ICD-10-CM | POA: Diagnosis not present

## 2020-09-27 ENCOUNTER — Encounter (INDEPENDENT_AMBULATORY_CARE_PROVIDER_SITE_OTHER): Payer: Self-pay

## 2020-09-27 ENCOUNTER — Other Ambulatory Visit: Payer: Self-pay

## 2020-09-27 ENCOUNTER — Ambulatory Visit (INDEPENDENT_AMBULATORY_CARE_PROVIDER_SITE_OTHER): Payer: BC Managed Care – PPO | Admitting: Family Medicine

## 2020-09-27 ENCOUNTER — Encounter (INDEPENDENT_AMBULATORY_CARE_PROVIDER_SITE_OTHER): Payer: Self-pay | Admitting: Family Medicine

## 2020-09-27 VITALS — BP 133/73 | HR 110 | Temp 98.1°F | Ht 74.0 in | Wt >= 6400 oz

## 2020-09-27 DIAGNOSIS — Z6841 Body Mass Index (BMI) 40.0 and over, adult: Secondary | ICD-10-CM

## 2020-09-27 DIAGNOSIS — E8881 Metabolic syndrome: Secondary | ICD-10-CM

## 2020-09-27 DIAGNOSIS — Z9189 Other specified personal risk factors, not elsewhere classified: Secondary | ICD-10-CM | POA: Diagnosis not present

## 2020-09-27 DIAGNOSIS — I10 Essential (primary) hypertension: Secondary | ICD-10-CM | POA: Diagnosis not present

## 2020-09-27 MED ORDER — VICTOZA 18 MG/3ML ~~LOC~~ SOPN: 0.6000 mg | PEN_INJECTOR | Freq: Every day | SUBCUTANEOUS | 0 refills | Status: DC

## 2020-09-27 MED ORDER — BD PEN NEEDLE NANO 2ND GEN 32G X 4 MM MISC: 1.0000 | Freq: Two times a day (BID) | 0 refills | Status: DC

## 2020-09-28 ENCOUNTER — Encounter: Payer: Self-pay | Admitting: Adult Health

## 2020-09-28 ENCOUNTER — Ambulatory Visit (INDEPENDENT_AMBULATORY_CARE_PROVIDER_SITE_OTHER): Payer: BC Managed Care – PPO | Admitting: Adult Health

## 2020-09-28 ENCOUNTER — Ambulatory Visit (INDEPENDENT_AMBULATORY_CARE_PROVIDER_SITE_OTHER): Payer: BC Managed Care – PPO

## 2020-09-28 VITALS — BP 122/70 | HR 102 | Temp 98.4°F | Ht 74.0 in | Wt >= 6400 oz

## 2020-09-28 DIAGNOSIS — R0602 Shortness of breath: Secondary | ICD-10-CM

## 2020-09-28 DIAGNOSIS — J181 Lobar pneumonia, unspecified organism: Secondary | ICD-10-CM | POA: Diagnosis not present

## 2020-09-28 DIAGNOSIS — R918 Other nonspecific abnormal finding of lung field: Secondary | ICD-10-CM | POA: Diagnosis not present

## 2020-09-28 MED ORDER — LEVOFLOXACIN 750 MG PO TABS: 750.0000 mg | ORAL_TABLET | Freq: Every day | ORAL | 0 refills | Status: DC

## 2020-09-28 MED ORDER — ALBUTEROL SULFATE HFA 108 (90 BASE) MCG/ACT IN AERS: 2.0000 | INHALATION_SPRAY | Freq: Four times a day (QID) | RESPIRATORY_TRACT | 0 refills | Status: DC | PRN

## 2020-09-28 NOTE — Progress Notes (Signed)
Subjective:    Patient ID: Maureen Lewis, female    DOB: [birth year], 24 y.o.   MRN: [id number]  HPI 24 year old female who  has a past medical history of Abnormal uterine bleeding, Acid reflux, Alpha thalassemia trait, Amenorrhea, Anemia, Back pain, Cesarean delivery delivered (10/11/2019), Chest pain, Complication of anesthesia, Constipation, Depression, Finger mass, left (03/2017), Gestational diabetes, Gestational diabetes (05/23/2019), Gestational hypertension (09/23/2019), Headache, Hypertension, Infection, Irritable bowel syndrome (IBS), Joint pain, Morbid obesity with body mass index (BMI) of 45.0 to 49.9 in adult [facility]), Obesity during pregnancy, antepartum (04/06/2019), Plantar fasciitis, bilateral, Polycystic ovary syndrome, Prediabetes, Pregnancy induced hypertension, Shortness of breath, Sleep apnea, Supervision of normal first pregnancy (04/06/2019), and Vertigo (2017).  She is being seen today for follow up regarding shortness of breath. She was seen by another provider in the office about three weeks ago for worsening shortness of breath. She had a Covid infection in August 2021 that required 02 therapy. Her breathing never really recovered but was worse when she was seen three weeks ago.   Her chest xray was suspicious for pneumonia. She was given a course of Azithromycin.   She reports that when she was taking the antibiotics she was improving a " little bit" but since then she reports that there has been no improvement, she is still short of breath with rest and becomes winded with minimal exertion. She reports " it feels like I still have pneumonia"  Denies fevers, chills, or coughing  Review of Systems See HPI   Past Medical History:  Diagnosis Date  . Abnormal uterine bleeding   . Acid reflux   . Alpha thalassemia trait   . Amenorrhea   . Anemia    no current med.  . Back pain   . Cesarean delivery delivered 10/11/2019   10/09/2019 - primary CS for failed IOL   . Chest pain   . Complication of anesthesia    states had to keep giving her anesthesia during EGD  . Constipation   . Depression    "I'm good"  . Finger mass, left 03/2017   middle finger  . Gestational diabetes   . Gestational diabetes 05/23/2019   Current Diabetic Medications:  None  [ ]  Aspirin 81 mg daily after 12 weeks (? A2/B GDM)  Required Referrals for A1GDM or A2GDM: [ ]  Diabetes Education and Testing Supplies [ ]  Nutrition Cousult  For A2/B GDM or higher classes of DM [ ]  Diabetes Education and Testing Supplies [ ]  Nutrition Counsult [ ]  Fetal ECHO after 20 weeks  [ ]  Eye exam for retina evaluation   Baseline and surveillance labs (  . Gestational hypertension 09/23/2019   Guidelines for Antenatal Testing and Sonography  (with updated ICD-10 codes)  Updated  10-07-19 with Dr. Tama High  INDICATION U/S 2 X week NST/AFI  or full BPP wkly DELIVERY Diabetes   A1 - good control - O24.410    A2 - good control - O24.419      A2  - poor control or poor compliance - O24.419, E11.65   (Macrosomia or polyhydramnios) **E11.65 is extra code for poor control**    A2/B - O24.  Marland Kitchen Headache   . Hypertension   . Infection    UTI  . Irritable bowel syndrome (IBS)   . Joint pain   . Morbid obesity with body mass index (BMI) of 45.0 to 49.9 in adult [facility])   . Obesity during pregnancy, antepartum 04/06/2019  Body mass index is 53.71 kg/m.  Recommendations $RemoveBeforeDE'[x]'ycMSkjcROYtRYRG$  Aspirin 81 mg daily after 12 weeks; discontinue after 36 weeks $Remove'[ ]'EBAYlDX$  Nutrition consult $RemoveBeforeDEI'[ ]'OFBWEqzpPwXmcBdt$  Weight gain 11-20 lbs for singleton and 25-35 lbs for twin pregnancy (IOM guidelines) Higher class of obesity patients recommended to gain closer to lower limit  Weight loss is associated with adverse outcomes $RemoveBeforeDE'[ ]'vKmSJGpXceKqaAb$  Baseline and surveillance labs (pulled in fr  . Plantar fasciitis, bilateral   . Polycystic ovary syndrome   . Prediabetes    "prediabetes"  . Pregnancy induced hypertension   . Shortness of breath   . Sleep apnea    no CPAP use  .  Supervision of normal first pregnancy 04/06/2019   [name]: [ x]Initial [ x]12 $Remo'[ ]'foizF$ 20 $Remo'[ ]'yMQDv$ 28 $Remo'[ ]'nqoCD$ 32 $Remo'[ ]'JbHsU$ 36 $Remo'[ ]'LfWDA$ 38 $Remo'[ ]'ZKymQ$ 39 $Remo'[ ]'djBTt$ 40 Nursing Staff Provider Office Location [facility]  Dating   LMP Language  English  Anatomy US  Nml Flu Vaccine  Declined 07/28/19 Genetic Screen  NIPS: low risks   AFP:   negative  TDaP vaccine   08/25/19 Hgb A1C or  GTT Early A1C 5.8 Third trimester: GDM insulin Rhogam  NA   LAB RESULTS  Feeding Plan Breast Blood Type  . Vertigo 2017    Social History   Socioeconomic History  . Marital status: Single    Spouse name: Not on file  . Number of children: Not on file  . Years of education: Not on file  . Highest education level: Not on file  Occupational History  . Occupation: Chemical engineer: [id number]  Tobacco Use  . Smoking status: Never Smoker  . Smokeless tobacco: Never Used  Vaping Use  . Vaping Use: Never used  Substance and Sexual Activity  . Alcohol use: Not Currently    Comment: social   . Drug use: No  . Sexual activity: Not Currently    Birth control/protection: None  Other Topics Concern  . Not on file  Social History Narrative   Right handed    Soda sometimes   Lives with mom and cousin, a grandmother in [place] also helps care for her.       She is in nursing school.    Social Determinants of Health   Financial Resource Strain: Not on file  Food Insecurity: Not on file  Transportation Needs: Not on file  Physical Activity: Not on file  Stress: Not on file  Social Connections: Not on file  Intimate Partner Violence: Not on file    Past Surgical History:  Procedure Laterality Date  . CESAREAN SECTION N/A 10/09/2019   Procedure: CESAREAN SECTION;  Surgeon: Aletha Halim, MD;  Location: [facility];  Service: Obstetrics;  Laterality: N/A;  . COLONOSCOPY WITH PROPOFOL  10/07/2016  . ESOPHAGOGASTRODUODENOSCOPY  12/21/2015  . EXCISION MASS UPPER EXTREMETIES Left 04/21/2017   Procedure: EXCISION MASS LEFT MIDDLE FINGER;   Surgeon: Daryll Brod, MD;  Location: [place];  Service: Orthopedics;  Laterality: Left;  . IR FL GUIDED LOC OF NEEDLE/CATH TIP FOR SPINAL INJECTION RT  03/13/2020    Family History  Problem Relation Age of Onset  . Diabetes Maternal Aunt   . Hypertension Maternal Uncle   . Depression Maternal Grandfather   . Diabetes Maternal Grandfather   . Hypertension Maternal Grandfather   . Arthritis Mother   . High blood pressure Mother   . Depression Mother   . Sleep apnea Mother   . Obesity Mother   . Diabetes Mother   .  Hypertension Mother   . Kidney failure Mother   . Sleep apnea Father   . Obesity Father   . Healthy Daughter     Allergies  Allergen Reactions  . Bee Pollen Hives, Shortness Of Breath and Swelling  . Bee Venom Hives, Shortness Of Breath and Swelling  . Hydrocodone-Acetaminophen Anaphylaxis    No problem when she takes Tylenol  . Peach Flavor Hives, Shortness Of Breath and Other (See Comments)    SWELLING OF MOUTH  . Pollen Extract Hives, Shortness Of Breath and Swelling  . Remdesivir Swelling    Angioedema 8/25    Current Outpatient Medications on File Prior to Visit  Medication Sig Dispense Refill  . amLODipine (NORVASC) 10 MG tablet Take 1 tablet (10 mg total) by mouth daily. 30 tablet 2  . baclofen (LIORESAL) 10 MG tablet Take 1 tablet (10 mg total) by mouth 2 (two) times daily. 30 each 0  . Blood Pressure Monitoring KIT 1 kit by Does not apply route once a week. 1 kit 0  . diclofenac (VOLTAREN) 75 MG EC tablet Take 1 tablet (75 mg total) by mouth 2 (two) times daily. 60 tablet 1  . hydrochlorothiazide (HYDRODIURIL) 25 MG tablet Take 1 tablet (25 mg total) by mouth daily. 30 tablet 2  . ibuprofen (ADVIL) 800 MG tablet Take 1 tablet (800 mg total) by mouth every 8 (eight) hours as needed. 30 tablet 5  . liraglutide (VICTOZA) 18 MG/3ML SOPN Inject 0.6 mg into the skin daily. 3 mL 0  . loratadine (CLARITIN) 10 MG tablet Take 10 mg by mouth daily  as needed for allergies.     Marland Kitchen nortriptyline (PAMELOR) 25 MG capsule Take 25 mg by mouth 2 (two) times daily.    . polyethylene glycol powder (GLYCOLAX/MIRALAX) 17 GM/SCOOP powder Take 17 g by mouth daily as needed for moderate constipation.     . topiramate (TOPAMAX) 25 MG tablet Take 2 tablets (50 mg total) by mouth 2 (two) times daily. 180 tablet 1  . Vitamin D, Ergocalciferol, (DRISDOL) 1.25 MG (50000 UNIT) CAPS capsule Take 1 capsule (50,000 Units total) by mouth every 7 (seven) days. 4 capsule 0  . acetaZOLAMIDE (DIAMOX) 500 MG capsule Take 1 capsule (500 mg total) by mouth 2 (two) times daily. For headache (Patient not taking: Reported on 09/28/2020) 60 capsule 2  . amitriptyline (ELAVIL) 25 MG tablet Take 1-2 tablets (25-50 mg total) by mouth at bedtime. (Patient not taking: Reported on 09/28/2020) 60 tablet 2  . ferrous sulfate 325 (65 FE) MG tablet Take 1 tablet (325 mg total) by mouth every other day. 45 tablet 1  . pantoprazole (PROTONIX) 40 MG tablet Take 1 tablet (40 mg total) by mouth daily. (Patient not taking: Reported on 09/28/2020) 30 tablet 3   No current facility-administered medications on file prior to visit.    BP 122/70   Pulse (!) 102   Temp 98.4 F (36.9 C) (Oral)   Ht $R'6\' 2"'Xo$  (1.88 m)   Wt (!) 467 lb (211.8 kg)   LMP  (LMP Unknown)   SpO2 95%   BMI 59.96 kg/m       Objective:   Physical Exam Vitals and nursing note reviewed.  Constitutional:      Appearance: Normal appearance. She is obese.  Cardiovascular:     Rate and Rhythm: Normal rate and regular rhythm.     Pulses: Normal pulses.     Heart sounds: Normal heart sounds.  Pulmonary:  Breath sounds: Normal breath sounds.     Comments: Mild shortness of breath in the office today with sitting   Neurological:     General: No focal deficit present.     Mental Status: She is alert and oriented to person, place, and time.  Psychiatric:        Mood and Affect: Mood normal.        Behavior: Behavior  normal.        Thought Content: Thought content normal.        Judgment: Judgment normal.       Assessment & Plan:  1. Shortness of breath - likely still has pneumonia. Do to it being close to the weekend will start on Levaquin while we wait on chest xray  - levofloxacin (LEVAQUIN) 750 MG tablet; Take 1 tablet (750 mg total) by mouth daily.  Dispense: 7 tablet; Refill: 0 - DG Chest 2 View; Future - albuterol (VENTOLIN HFA) 108 (90 Base) MCG/ACT inhaler; Inhale 2 puffs into the lungs every 6 (six) hours as needed for wheezing or shortness of breath.  Dispense: 8 g; Refill: 0   Dorothyann Peng, NP

## 2020-09-29 ENCOUNTER — Encounter: Payer: Self-pay | Admitting: Adult Health

## 2020-10-01 ENCOUNTER — Telehealth (INDEPENDENT_AMBULATORY_CARE_PROVIDER_SITE_OTHER): Payer: BC Managed Care – PPO | Admitting: Obstetrics

## 2020-10-01 ENCOUNTER — Encounter: Payer: Self-pay | Admitting: Obstetrics

## 2020-10-01 DIAGNOSIS — R102 Pelvic and perineal pain unspecified side: Secondary | ICD-10-CM

## 2020-10-01 DIAGNOSIS — N912 Amenorrhea, unspecified: Secondary | ICD-10-CM | POA: Diagnosis not present

## 2020-10-01 DIAGNOSIS — G8929 Other chronic pain: Secondary | ICD-10-CM

## 2020-10-01 DIAGNOSIS — Z6841 Body Mass Index (BMI) 40.0 and over, adult: Secondary | ICD-10-CM

## 2020-10-01 MED ORDER — MEDROXYPROGESTERONE ACETATE 10 MG PO TABS: 10.0000 mg | ORAL_TABLET | Freq: Every day | ORAL | 0 refills | Status: DC

## 2020-10-01 MED ORDER — OXYCODONE-ACETAMINOPHEN 5-325 MG PO TABS: 1.0000 | ORAL_TABLET | Freq: Four times a day (QID) | ORAL | 0 refills | Status: DC | PRN

## 2020-10-01 NOTE — Progress Notes (Signed)
GYNECOLOGY VIRTUAL VISIT ENCOUNTER NOTE  Provider location: [facility] [place]   I connected with [place] on 10/01/20 at  1:00 PM EST by MyChart Video Encounter at home and verified that I am speaking with the correct person using two identifiers.   I discussed the limitations, risks, security and privacy concerns of performing an evaluation and management service virtually and the availability of in person appointments. I also discussed with the patient that there may be a patient responsible charge related to this service. The patient expressed understanding and agreed to proceed.   History:  Maureen Lewis is a 24 y.o. G61P1001 female being evaluated today for chronic pelvic pain and amenorrhea. She denies any abnormal vaginal discharge, or other concerns.  She had an uncomplicated C/S in December 2020.  Started Depo Provera injections in February 2021, and stopped Depo after that injection.  She says that her LMP was in January 2020.  Labs, including UPT and TSH are normal.  Ultrasound is normal.     Past Medical History:  Diagnosis Date  . Abnormal uterine bleeding   . Acid reflux   . Alpha thalassemia trait   . Amenorrhea   . Anemia    no current med.  . Back pain   . Cesarean delivery delivered 10/11/2019   10/09/2019 - primary CS for failed IOL  . Chest pain   . Complication of anesthesia    states had to keep giving her anesthesia during EGD  . Constipation   . Depression    "I'm good"  . Finger mass, left 03/2017   middle finger  . Gestational diabetes   . Gestational diabetes 05/23/2019   Current Diabetic Medications:  None  [ ]  Aspirin 81 mg daily after 12 weeks (? A2/B GDM)  Required Referrals for A1GDM or A2GDM: [ ]  Diabetes Education and Testing Supplies [ ]  Nutrition Cousult  For A2/B GDM or higher classes of DM [ ]  Diabetes Education and Testing Supplies [ ]  Nutrition Counsult [ ]  Fetal ECHO after 20 weeks  [ ]  Eye  exam for retina evaluation   Baseline and surveillance labs (  . Gestational hypertension 09/23/2019   Guidelines for Antenatal Testing and Sonography  (with updated ICD-10 codes)  Updated  09/28/2019 with Dr. Tama High  INDICATION U/S 2 X week NST/AFI  or full BPP wkly DELIVERY Diabetes   A1 - good control - O24.410    A2 - good control - O24.419      A2  - poor control or poor compliance - O24.419, E11.65   (Macrosomia or polyhydramnios) **E11.65 is extra code for poor control**    A2/B - O24.  Marland Kitchen Headache   . Hypertension   . Infection    UTI  . Irritable bowel syndrome (IBS)   . Joint pain   . Morbid obesity with body mass index (BMI) of 45.0 to 49.9 in adult [facility])   . Obesity during pregnancy, antepartum 04/06/2019   Body mass index is 53.71 kg/m.  Recommendations [x]  Aspirin 81 mg daily after 12 weeks; discontinue after 36 weeks [ ]  Nutrition consult [ ]  Weight gain 11-20 lbs for singleton and 25-35 lbs for twin pregnancy (IOM guidelines) Higher class of obesity patients recommended to gain closer to lower limit  Weight loss is associated with adverse outcomes [ ]  Baseline and surveillance labs (pulled in fr  . Plantar fasciitis, bilateral   . Polycystic ovary syndrome   .  Prediabetes    "prediabetes"  . Pregnancy induced hypertension   . Shortness of breath   . Sleep apnea    no CPAP use  . Supervision of normal first pregnancy 04/06/2019   [name]: [ x]Initial [ x]12 [ ] 20 [ ] 28 [ ] 32 [ ] 36 [ ] 38 [ ] 39 [ ] 40 Nursing Staff Provider Office Location [facility]  Dating   LMP Language  English  Anatomy US  Nml Flu Vaccine  Declined 07/28/19 Genetic Screen  NIPS: low risks   AFP:   negative  TDaP vaccine   08/25/19 Hgb A1C or  GTT Early A1C 5.8 Third trimester: GDM insulin Rhogam  NA   LAB RESULTS  Feeding Plan Breast Blood Type  . Vertigo 2017   Past Surgical History:  Procedure Laterality Date  . CESAREAN SECTION N/A 10/09/2019   Procedure: CESAREAN SECTION;  Surgeon:  Aletha Halim, MD;  [facility];  Service: Obstetrics;  Laterality: N/A;  . COLONOSCOPY WITH PROPOFOL  10/07/2016  . ESOPHAGOGASTRODUODENOSCOPY  12/21/2015  . EXCISION MASS UPPER EXTREMETIES Left 04/21/2017   Procedure: EXCISION MASS LEFT MIDDLE FINGER;  Surgeon: Daryll Brod, MD;  Location: [place];  Service: Orthopedics;  Laterality: Left;  . IR FL GUIDED LOC OF NEEDLE/CATH TIP FOR SPINAL INJECTION RT  03/13/2020   The following portions of the patient's history were reviewed and updated as appropriate: allergies, current medications, past family history, past medical history, past social history, past surgical history and problem list.   Health Maintenance:  Normal pap and negative HRHPV on 04-11-2020.  Review of Systems:  Pertinent items noted in HPI and remainder of comprehensive ROS otherwise negative.  Physical Exam:   General:  Alert, oriented and cooperative. Patient appears to be in no acute distress.  Mental Status: Normal mood and affect. Normal behavior. Normal judgment and thought content.   Respiratory: Normal respiratory effort, no problems with respiration noted  Rest of physical exam deferred due to type of encounter  Labs and Imaging No results found for this or any previous visit (from the past 336 hour(s)). DG Chest 2 View  Result Date: 09/28/2020 CLINICAL DATA:  Follow-up right lower lobe pneumonia EXAM: CHEST - 2 VIEW COMPARISON:  09/07/2020 FINDINGS: patchy opacity again noted at the right lung base, similar to prior study. No confluent opacity on the left. Heart is normal size. No effusions or acute bony abnormality. IMPRESSION: Stable patchy right basilar infiltrate. Electronically Signed   By: Rolm Baptise M.D.   On: 09/28/2020 22:08   DG Chest 2 View  Result Date: 09/07/2020 CLINICAL DATA:  Shortness of breath. History of COVID-19. Increasing shortness of breath. EXAM: CHEST - 2 VIEW COMPARISON:  Prior chest radiographs 07/20/2020.  FINDINGS: Heart size within normal limits. Ill-defined opacity within the right lung base. No appreciable consolidation within the left lung. No evidence of pleural effusion or pneumothorax. No acute bony abnormality identified. IMPRESSION: Ill-defined opacity within the right lung base, suspicious for pneumonia given the provided history. Electronically Signed   By: Kellie Simmering DO   On: 09/07/2020 12:08   MR BRAIN W WO CONTRAST  Result Date: 09/08/2020  [facility] [address], [place], [place] [address] [phone] NEUROIMAGING REPORT STUDY DATE: 09/07/2020 PATIENT NAME: Maureen Lewis DOB: September 27, 1996 MRN: [id number] EXAM: MRI Brain with and without contrast ORDERING CLINICIAN: Asencion Partridge Dohmeier MD CLINICAL HISTORY: 24 year old woman with recent concussion COMPARISON FILMS: MRI a 09/04/2016 TECHNIQUE:MRI of the brain with and  without contrast was obtained utilizing 5 mm axial slices with T1, T2, T2 flair, SWI and diffusion weighted views.  T1 sagittal, T2 coronal and postcontrast views in the axial and coronal plane were obtained. CONTRAST: 26 ml [employer] IMAGING SITE: [facility], [place]. FINDINGS: On sagittal images, the spinal cord is imaged caudally to C3 and is normal in caliber.   The contents of the posterior fossa are of normal size and position.   The pituitary gland and optic chiasm appear normal.    Brain volume appears normal.   The ventricles are normal in size and without distortion.  There are no abnormal extra-axial collections of fluid.  The cerebellum and brainstem appears normal.   The deep gray matter appears normal.  The cerebral hemispheres appear normal.   Diffusion weighted images are normal.  Susceptibility weighted images are normal.   The orbits appear normal.   The VIIth/VIIIth nerve complex appears normal.  The mastoid air cells appear normal.  There is mucoperiosteal thickening in the maxillary sinuses and some of the  ethmoid air cells, left greater than right.  The other paranasal sinuses appear normal.  Flow voids are identified within the major intracerebral arteries.  After the infusion of contrast material, a normal enhancement pattern is noted.   This is a normal MRI of the brain with and without contrast.  However, there are left greater than right maxillary and ethmoid chronic sinusitis changes.  There are no acute findings. INTERPRETING PHYSICIAN: Richard A. Felecia Shelling, MD, PhD, FAAN Certified in  Neuroimaging by [employer]   MR ANKLE RIGHT WO CONTRAST  Result Date: 09/10/2020 CLINICAL DATA:  Medial and lateral ankle pain. Injured 2 months ago. EXAM: MRI OF THE RIGHT ANKLE WITHOUT CONTRAST TECHNIQUE: Multiplanar, multisequence MR imaging of the ankle was performed. No intravenous contrast was administered. COMPARISON:  None. FINDINGS: TENDONS Peroneal: Peroneal longus tendon intact. Peroneal brevis intact. Posteromedial: Posterior tibial tendon intact. Flexor hallucis longus tendon intact. Flexor digitorum longus tendon intact. Anterior: Tibialis anterior tendon intact. Extensor hallucis longus tendon intact Extensor digitorum longus tendon intact. Achilles:  Intact. Plantar Fascia: Intact. LIGAMENTS Lateral: Anterior talofibular ligament intact. Calcaneofibular ligament intact. Posterior talofibular ligament intact. Anterior and posterior tibiofibular ligaments intact. Medial: Deltoid ligament intact. Spring ligament intact. CARTILAGE Ankle Joint: No joint effusion. Normal ankle mortise. No chondral defect. Subtalar Joints/Sinus Tarsi: Normal subtalar joints. No subtalar joint effusion. Normal sinus tarsi. Bones: Subacute nondisplaced fracture of the posterior malleolus of the distal tibia with mild surrounding marrow edema. No acute fracture or dislocation. Soft Tissue: No fluid collection or hematoma. Muscles are normal without edema or atrophy. Tarsal tunnel is normal. IMPRESSION: 1. Subacute  nondisplaced fracture of the posterior malleolus of the distal tibia with mild surrounding marrow edema. Electronically Signed   By: Kathreen Devoid   On: 09/10/2020 15:23   US PELVIC COMPLETE WITH TRANSVAGINAL  Result Date: 09/24/2020 CLINICAL DATA:  LEFT lower quadrant pain; history of Caesarean section, polycystic ovarian syndrome EXAM: TRANSABDOMINAL AND TRANSVAGINAL ULTRASOUND OF PELVIS TECHNIQUE: Both transabdominal and transvaginal ultrasound examinations of the pelvis were performed. Transabdominal technique was performed for global imaging of the pelvis including uterus, ovaries, adnexal regions, and pelvic cul-de-sac. It was necessary to proceed with endovaginal exam following the transabdominal exam to visualize the uterus, endometrium, and LEFT ovary. COMPARISON:  04/18/2020 FINDINGS: Uterus Measurements: 5.5 x 4.1 x 6.1 cm = volume: 124 mL. Anteverted. Caesarean section scar. Heterogeneous myometrium with minimal scattered shadowing. No focal  mass. Endometrium Thickness: 14. Small amount of nonspecific endometrial and endocervical fluid is seen. No focal mass. Right ovary Measurements: 5.1 x 2.8 x 2.1 cm = volume: 15.7 mL. 3.1 x 2.0 x 2.7 cm diameter simple cyst RIGHT ovary consistent with benign functional cyst; no follow up imaging recommended. Note: This recommendation does not apply to premenarchal patients or to those with increased risk (genetic, family history, elevated tumor markers or other high-risk factors) of ovarian cancer. Reference: Radiology 2019 Nov; 293(2):359-371. Left ovary Measurements: 3.2 x 1.9 x 3.1 cm = volume: 9.7 mL. Normal morphology without mass Other findings No free pelvic fluid.  No adnexal masses. IMPRESSION: Small amount of nonspecific endometrial and endocervical fluid. Otherwise negative exam. Electronically Signed   By: Lavonia Dana M.D.   On: 09/24/2020 16:41       Assessment and Plan:     1. Chronic pelvic pain in female Rx: - Ambulatory referral to  Urogynecology - oxyCODONE-acetaminophen (PERCOCET/ROXICET) 5-325 MG tablet; Take 1-2 tablets by mouth every 6 (six) hours as needed for severe pain.  Dispense: 20 tablet; Refill: 0  2. Amenorrhea Rx: - medroxyPROGESTERone (PROVERA) 10 MG tablet; Take 1 tablet (10 mg total) by mouth daily.  Dispense: 10 tablet; Refill: 0 - Ambulatory referral to Urogynecology  3. Class 3 severe obesity due to excess calories without serious comorbidity with body mass index (BMI) of 45.0 to 49.9 in adult [facility])        I discussed the assessment and treatment plan with the patient. The patient was provided an opportunity to ask questions and all were answered. The patient agreed with the plan and demonstrated an understanding of the instructions.   The patient was advised to call back or seek an in-person evaluation/go to the ED if the symptoms worsen or if the condition fails to improve as anticipated.  I provided 15 minutes of face-to-face time during this encounter.   Baltazar Najjar, MD [facility] 10/01/20

## 2020-10-01 NOTE — Progress Notes (Signed)
Chief Complaint:   OBESITY Maureen Lewis is here to discuss her progress with her obesity treatment plan along with follow-up of her obesity related diagnoses. Maureen Lewis is on the Category 3 Plan and states she is following her eating plan approximately 40% of the time. Maureen Lewis states she is doing 0 minutes 0 times per week.  Today's visit was #: 6 Starting weight: 437 lbs Starting date: 04/09/2020 Today's weight: 461 lbs Today's date: 09/27/2020 Total lbs lost to date: 0 lbs Total lbs lost since last in-office visit: 0 lbs  Interim History: Maureen Lewis reports that she got COVID at the end of August and really hasn't been able to eat much since then. She is able to tolerate liquids by mouth but not all the food. She is often not able to tolerate a full meal from Category 3. Maureen Lewis also broke her ankle in the past few months, so she has been dealing with that as well.  Subjective:   1. Metabolic syndrome Maureen Lewis's last blood sugar was 102 (previoulsy 229). She is not on medication.  2. Essential hypertension Maureen Lewis's blood pressure is well controlled. She denies chest pain, chest pressure, or headache. She is prescribed Norvasc and HCTZ.  Assessment/Plan:   1. Metabolic syndrome Maureen Lewis has agreed to start Victoza 0.6 mg SubQ daily.   - liraglutide (VICTOZA) 18 MG/3ML SOPN; Inject 0.6 mg into the skin daily.  Dispense: 3 mL; Refill: 0  2. Essential hypertension Continue current treatment plan with no dose change. Maureen Lewis is working on healthy weight loss and exercise to improve blood pressure control. We will watch for signs of hypotension as she continues her lifestyle modifications.  3. At risk for heart disease Maureen Lewis was given approximately 15 minutes of coronary artery disease prevention counseling today. She is 24 y.o. female and has risk factors for heart disease including obesity. We discussed intensive lifestyle modifications today with an emphasis on specific weight loss instructions and  strategies.   Repetitive spaced learning was employed today to elicit superior memory formation and behavioral change.  4. Class 3 severe obesity with serious comorbidity and body mass index (BMI) of 60.0 to 69.9 in adult, unspecified obesity type (HCC) Maureen Lewis is currently in the action stage of change. As such, her goal is to continue with weight loss efforts. She has agreed to the Category 3 Plan.   Exercise goals: No exercise has been prescribed at this time.  Behavioral modification strategies: increasing lean protein intake, meal planning and cooking strategies, holiday eating strategies  and planning for success.  Maureen Lewis has agreed to follow-up with our clinic in 4 weeks. She was informed of the importance of frequent follow-up visits to maximize her success with intensive lifestyle modifications for her multiple health conditions.   Objective:   Blood pressure 133/73, pulse (!) 110, temperature 98.1 F (36.7 C), temperature source Oral, height 6\' 2"  (1.88 m), weight (!) 461 lb (209.1 kg), SpO2 98 %, not currently breastfeeding. Body mass index is 59.19 kg/m.  General: Cooperative, alert, well developed, in no acute distress. HEENT: Conjunctivae and lids unremarkable. Cardiovascular: Regular rhythm.  Lungs: Normal work of breathing. Neurologic: No focal deficits.   Lab Results  Component Value Date   CREATININE 0.71 06/26/2020   BUN 12 06/26/2020   NA 141 06/26/2020   K 4.1 06/26/2020   CL 109 06/26/2020   CO2 23 06/26/2020   Lab Results  Component Value Date   ALT 19 06/26/2020   AST 15 06/26/2020  ALKPHOS 52 06/15/2020   BILITOT 0.9 06/26/2020   Lab Results  Component Value Date   HGBA1C 6.0 (H) 04/09/2020   HGBA1C 5.8 (H) 04/06/2019   HGBA1C 6.0 04/27/2018   HGBA1C 5.8 (H) 08/13/2017   HGBA1C 5.9 03/08/2014   Lab Results  Component Value Date   INSULIN 36.8 (H) 04/09/2020   INSULIN 42.1 (H) 08/13/2017   Lab Results  Component Value Date   TSH 0.832  09/12/2020   Lab Results  Component Value Date   CHOL 156 04/09/2020   HDL 42 04/09/2020   LDLCALC 101 (H) 04/09/2020   TRIG 53 06/11/2020   CHOLHDL 3 04/27/2018   Lab Results  Component Value Date   WBC 7.8 06/26/2020   HGB 12.6 06/26/2020   HCT 39.4 06/26/2020   MCV 70.4 (L) 06/26/2020   PLT 296 06/26/2020   Lab Results  Component Value Date   IRON 36 (L) 05/16/2020   TIBC 475 (H) 05/16/2020   FERRITIN 92 06/15/2020   Attestation Statements:   Reviewed by clinician on day of visit: allergies, medications, problem list, medical history, surgical history, family history, social history, and previous encounter notes.  Coral Ceo, am acting as transcriptionist for Coralie Common, MD.  I have reviewed the above documentation for accuracy and completeness, and I agree with the above. - Jinny Blossom, MD

## 2020-10-02 ENCOUNTER — Telehealth: Payer: Self-pay

## 2020-10-02 NOTE — Telephone Encounter (Signed)
Spoke with pt to inform her that we received her disability paperwork and that Dr. Georgina Snell is out of the office this week for vacation, but that he would get this completed upon his return. Please note that the paperwork is due on 10/08/20.

## 2020-10-03 ENCOUNTER — Ambulatory Visit (HOSPITAL_COMMUNITY)
Admission: RE | Admit: 2020-10-03 | Discharge: 2020-10-03 | Disposition: A | Discharge status: Home / Self Care | Payer: BC Managed Care – PPO | Source: Ambulatory Visit | Attending: Internal Medicine | Admitting: Internal Medicine

## 2020-10-03 ENCOUNTER — Other Ambulatory Visit: Payer: Self-pay

## 2020-10-03 DIAGNOSIS — R079 Chest pain, unspecified: Secondary | ICD-10-CM | POA: Insufficient documentation

## 2020-10-03 DIAGNOSIS — R0600 Dyspnea, unspecified: Secondary | ICD-10-CM | POA: Insufficient documentation

## 2020-10-03 DIAGNOSIS — Z8616 Personal history of COVID-19: Secondary | ICD-10-CM | POA: Diagnosis not present

## 2020-10-03 DIAGNOSIS — R0602 Shortness of breath: Secondary | ICD-10-CM | POA: Insufficient documentation

## 2020-10-03 DIAGNOSIS — I10 Essential (primary) hypertension: Secondary | ICD-10-CM | POA: Insufficient documentation

## 2020-10-03 LAB — ECHOCARDIOGRAM COMPLETE
Area-P 1/2: 2.46 cm2
S' Lateral: 4.3 cm

## 2020-10-03 NOTE — Progress Notes (Signed)
  Echocardiogram 2D Echocardiogram has been performed.  Maureen Lewis 10/03/2020, 3:11 PM

## 2020-10-04 NOTE — Progress Notes (Signed)
[facility] New Patient Evaluation and Consultation  Referring Provider: Shelly Bombard, MD PCP: Dorothyann Peng, NP Date of Service: 10/09/2020  SUBJECTIVE Chief Complaint: New Patient (Initial Visit) and Chronic Pelvic Pain  History of Present Illness: Maureen Lewis is a 24 y.o. Black or African-American female seen in consultation at the request of Dr. Jodi Mourning for evaluation of pelvic pain.    Review of records from Dr Jodi Mourning significant for: Has been having pelvic pain. C/S in Dec 2020 and started Depo Provera in Feb 2020 but only had one injection.   Pelvic US was performed on 09/24/20: IMPRESSION: Small amount of nonspecific endometrial and endocervical fluid.   Urinary Symptoms: Leaks urine with cough/ sneeze and with a full bladder. No urgency symptoms.  Leaks 1 time(s) per day.  Pad use: 1 liners/ mini-pads per day.   She is bothered by her UI symptoms.  Day time voids 5.  Nocturia: 3 times per night to void- sometimes wakes up from pain and has to urinate.  Voiding dysfunction: she does not empty her bladder well.  does not use a catheter to empty bladder.  When urinating, she feels difficulty starting urine stream, dribbling after finishing and the need to urinate multiple times in a row.  Stops drinking a few hours before bed.  Has diagnosis of sleep apnea, is waiting for CPAP.   UTIs: 0 UTI's in the last year.   Denies history of blood in urine and kidney or bladder stones  Pelvic Organ Prolapse Symptoms:                  She Denies a feeling of a bulge the vaginal area.   Bowel Symptom: Bowel movements: 2 time(s) per week Stool consistency: hard Straining: yes.  Splinting: yes.  Incomplete evacuation: yes.  She Denies accidental bowel leakage / fecal incontinence Bowel regimen: stool softener and miralax as needed Last colonoscopy: n/a  Sexual Function Sexually active: yes.  Sexual orientation: Straight Pain with sex: at the  vaginal opening, deep in the pelvis  Pelvic Pain Admits to pelvic pain- sharp, stabbing, ripping pain Location: left lower side, lower abdomen, vaginal pain Pain occurs: all the time Prior pain treatment: NSAIDs, oxycodone Improved by: medication and heat  Worsened by: "anything"   Past Medical History:  Past Medical History:  Diagnosis Date  . Abnormal uterine bleeding   . Acid reflux   . Alpha thalassemia trait   . Amenorrhea   . Anemia    no current med.  . Back pain   . Cesarean delivery delivered 10/11/2019   10/09/2019 - primary CS for failed IOL  . Chest pain   . Complication of anesthesia    states had to keep giving her anesthesia during EGD  . Constipation   . Depression    "I'm good"  . Finger mass, left 03/2017   middle finger  . Gestational diabetes   . Gestational diabetes 05/23/2019   Current Diabetic Medications:  None  [ ]  Aspirin 81 mg daily after 12 weeks (? A2/B GDM)  Required Referrals for A1GDM or A2GDM: [ ]  Diabetes Education and Testing Supplies [ ]  Nutrition Cousult  For A2/B GDM or higher classes of DM [ ]  Diabetes Education and Testing Supplies [ ]  Nutrition Counsult [ ]  Fetal ECHO after 20 weeks  [ ]  Eye exam for retina evaluation   Baseline and surveillance labs (  . Gestational hypertension 09/23/2019   Guidelines for Antenatal Testing and  Sonography  (with updated ICD-10 codes)  Updated  09/13/2019 with Dr. Tama High  INDICATION U/S 2 X week NST/AFI  or full BPP wkly DELIVERY Diabetes   A1 - good control - O24.410    A2 - good control - O24.419      A2  - poor control or poor compliance - O24.419, E11.65   (Macrosomia or polyhydramnios) **E11.65 is extra code for poor control**    A2/B - O24.  Marland Kitchen Headache   . Hypertension   . Infection    UTI  . Irritable bowel syndrome (IBS)   . Joint pain   . Morbid obesity with body mass index (BMI) of 45.0 to 49.9 in adult [facility])   . Obesity during pregnancy, antepartum 04/06/2019   Body mass index is 53.71  kg/m.  Recommendations [x]  Aspirin 81 mg daily after 12 weeks; discontinue after 36 weeks [ ]  Nutrition consult [ ]  Weight gain 11-20 lbs for singleton and 25-35 lbs for twin pregnancy (IOM guidelines) Higher class of obesity patients recommended to gain closer to lower limit  Weight loss is associated with adverse outcomes [ ]  Baseline and surveillance labs (pulled in fr  . Plantar fasciitis, bilateral   . Polycystic ovary syndrome   . Prediabetes    "prediabetes"  . Pregnancy induced hypertension   . Shortness of breath   . Sleep apnea    no CPAP use  . Supervision of normal first pregnancy 04/06/2019   [name]: [ x]Initial [ x]12 [ ] 20 [ ] 28 [ ] 32 [ ] 36 [ ] 38 [ ] 39 [ ] 40 Nursing Staff Provider Office Location [facility]  Dating   LMP Language  English  Anatomy US  Nml Flu Vaccine  Declined 07/28/19 Genetic Screen  NIPS: low risks   AFP:   negative  TDaP vaccine   08/25/19 Hgb A1C or  GTT Early A1C 5.8 Third trimester: GDM insulin Rhogam  NA   LAB RESULTS  Feeding Plan Breast Blood Type  . Vertigo 2017     Past Surgical History:   Past Surgical History:  Procedure Laterality Date  . CESAREAN SECTION N/A 10/09/2019   Procedure: CESAREAN SECTION;  Surgeon: Aletha Halim, MD;  Location: [facility];  Service: Obstetrics;  Laterality: N/A;  . COLONOSCOPY WITH PROPOFOL  10/07/2016  . ESOPHAGOGASTRODUODENOSCOPY  12/21/2015  . EXCISION MASS UPPER EXTREMETIES Left 04/21/2017   Procedure: EXCISION MASS LEFT MIDDLE FINGER;  Surgeon: Daryll Brod, MD;  Location: [facility];  Service: Orthopedics;  Laterality: Left;  . IR FL GUIDED LOC OF NEEDLE/CATH TIP FOR SPINAL INJECTION RT  03/13/2020     Past OB/GYN History: G1 P1 Vaginal deliveries: 0,  Forceps/ Vacuum deliveries: 0, Cesarean section: 1 Menopausal: No- has not had a period since her pregnancy Contraception: condoms. Last pap smear was 03/2020- negative.  Any history of abnormal pap smears: no. Has a history of PCOS  and just started metformin again.    Medications: She has a current medication list which includes the following prescription(s): acetazolamide, albuterol, amlodipine, baclofen, diclofenac, hydrochlorothiazide, loratadine, nortriptyline, oxycodone-acetaminophen, pantoprazole, polyethylene glycol powder, topiramate, vitamin d (ergocalciferol), diazepam, ferrous sulfate, levofloxacin, and victoza.   Allergies: Patient is allergic to bee pollen, bee venom, hydrocodone-acetaminophen, peach flavor, pollen extract, and remdesivir.   Social History:  Social History   Tobacco Use  . Smoking status: Never Smoker  . Smokeless tobacco: Never Used  Vaping Use  . Vaping Use: Never used  Substance Use Topics  . Alcohol use:  Not Currently    Comment: social   . Drug use: No    Relationship status: long-term partner She lives with mom, daughter and nephews.   She is employed- works at [employer], but has been on leave due to ankle injury. Regular exercise: No History of abuse: No  Family History:   Family History  Problem Relation Age of Onset  . Diabetes Maternal Aunt   . Hypertension Maternal Uncle   . Depression Maternal Grandfather   . Diabetes Maternal Grandfather   . Hypertension Maternal Grandfather   . Arthritis Mother   . High blood pressure Mother   . Depression Mother   . Sleep apnea Mother   . Obesity Mother   . Diabetes Mother   . Hypertension Mother   . Kidney failure Mother   . Sleep apnea Father   . Obesity Father   . Healthy Daughter      Review of Systems: Review of Systems  Constitutional: Negative for fever, malaise/fatigue and weight loss.  Respiratory: Negative for cough, shortness of breath and wheezing.   Cardiovascular: Negative for chest pain, palpitations and leg swelling.  Gastrointestinal: Negative for abdominal pain and blood in stool.  Genitourinary: Negative for dysuria.       +abnormal periods, +blood in stool  Musculoskeletal: Negative for  myalgias.  Skin: Negative for rash.  Neurological: Positive for dizziness and headaches.  Endo/Heme/Allergies: Does not bruise/bleed easily.  Psychiatric/Behavioral: Negative for depression. The patient is not nervous/anxious.      OBJECTIVE Physical Exam: Vitals:   10/09/20 1336  BP: 132/88  Pulse: (!) 110  Weight: (!) 463 lb (210 kg)  Height: 6\' 2"  (1.88 m)    Physical Exam Constitutional:      General: She is not in acute distress.    Appearance: She is obese.  Pulmonary:     Effort: Pulmonary effort is normal.  Abdominal:     Palpations: Abdomen is soft.     Tenderness: There is abdominal tenderness. There is no rebound.     Comments: Tenderness present near right portion of cesarean scar  Musculoskeletal:        General: No swelling. Normal range of motion.  Skin:    General: Skin is warm and dry.     Findings: No rash.  Neurological:     Mental Status: She is alert and oriented to person, place, and time.  Psychiatric:        Mood and Affect: Mood normal.        Behavior: Behavior normal.      GU / Detailed Urogynecologic Evaluation:  Pelvic Exam: Normal external female genitalia; Bartholin's and Skene's glands normal in appearance; urethral meatus normal in appearance, no urethral masses or discharge.   CST: negative  Speculum exam reveals normal vaginal mucosa without atrophy. Cervix normal appearance and scant blood coming from cervix. Uterus, not palpable. Adnexa- not palpable due to body habitus  Pelvic floor strength II/V  Pelvic floor musculature: Right levator tender, Right obturator tender, Left levator tender, Left obturator tender, non-relaxed puborectalis muscle, mildly tender to palpation  POP-Q:  Not performed, no prolapse noted.   Rectal Exam:  Normal external rectum  Post-Void Residual (PVR) by Bladder Scan: In order to evaluate bladder emptying, we discussed obtaining a postvoid residual and she agreed to this procedure.  Procedure:  The ultrasound unit was placed on the patient's abdomen in the suprapubic region after the patient had voided. A PVR of 65 ml was obtained by bladder  scan.  Laboratory Results: POC urine: + small blood, blood noted in vagina on exam.   I visualized the urine specimen, noting the specimen to be clear yellow   I independently reviewed the Pelvic ultrasound performed on 09/24/20 which showed: Uterus appears somewhat globular with heterogeneous myometrium, suggestive of adenomyosis, although myometrium appears symmetrical and no myometrial cysts visualized. Ovaries appear normal with simple right ovarian cyst.   ASSESSMENT AND PLAN Ms. Crotteau is a 24 y.o. with:  1. Levator spasm   2. SUI (stress urinary incontinence, female)   3. Urinary frequency   4. Constipation, unspecified constipation type     1. Levator spasm The origin of pelvic floor muscle spasm can be multifactorial, including primary, reactive to a different pain source, trauma, or even part of a centralized pain syndrome.Treatment options include pelvic floor physical therapy, local (vaginal) or oral  muscle relaxants, or centrally acting pain medications.   - Discussed that this can explain some of her symptoms related to vaginal/ pelvic pain, dyspareunia, difficulty initiating voiding and constipation. Overall pelvic US appeared wnl.  - Will place referral for pelvic floor PT. She would also like to start vaginal muscle relaxant. Prescription for valium 5mg  tabs placed. Instructed to place every night for a week then as needed after.   2. SUI For treatment of stress urinary incontinence,  non-surgical options include expectant management, weight loss, physical therapy, as well as a pessary. We did not review surgical options.  - Discussed that pelvic PT can also be helpful for this symptom.   3. Urinary frequency - Mostly at night. She is waiting for CPAP for her sleep apnea. Discussed how sleep apnea can contribute to nocturia.  We will reassess symptoms after she has received treatment.  - POC urine negative for infection. Empties bladder well after voiding.   4. Constipation - Discussed that constipation can also be contributing to her symptoms of pelvic pain.  - Advised to start a daily regimen of stool softener, miralax and a fiber supplement. She can titrate as needed once she becomes more regular.   Return 6 weeks for follow up   Jaquita Folds, MD   Medical Decision Making:  - Reviewed/ ordered a clinical laboratory test - Review and summation of prior records - Independent review of image

## 2020-10-08 NOTE — Progress Notes (Signed)
Echo of heart  shows no significant finding that would explain the shortness of breath symtoms  . Mak sure fy with PCP

## 2020-10-09 ENCOUNTER — Encounter: Payer: Self-pay | Admitting: Obstetrics and Gynecology

## 2020-10-09 ENCOUNTER — Telehealth: Payer: Self-pay | Admitting: *Deleted

## 2020-10-09 ENCOUNTER — Ambulatory Visit (INDEPENDENT_AMBULATORY_CARE_PROVIDER_SITE_OTHER): Payer: BC Managed Care – PPO | Admitting: Obstetrics and Gynecology

## 2020-10-09 ENCOUNTER — Other Ambulatory Visit: Payer: Self-pay

## 2020-10-09 VITALS — BP 132/88 | HR 110 | Ht 74.0 in | Wt >= 6400 oz

## 2020-10-09 DIAGNOSIS — N393 Stress incontinence (female) (male): Secondary | ICD-10-CM | POA: Diagnosis not present

## 2020-10-09 DIAGNOSIS — K5900 Constipation, unspecified: Secondary | ICD-10-CM

## 2020-10-09 DIAGNOSIS — M62838 Other muscle spasm: Secondary | ICD-10-CM | POA: Diagnosis not present

## 2020-10-09 DIAGNOSIS — R350 Frequency of micturition: Secondary | ICD-10-CM | POA: Diagnosis not present

## 2020-10-09 LAB — POCT URINALYSIS DIPSTICK
Appearance: NORMAL
Bilirubin, UA: NEGATIVE
Glucose, UA: NEGATIVE
Ketones, UA: NEGATIVE
Leukocytes, UA: NEGATIVE
Nitrite, UA: NEGATIVE
Protein, UA: NEGATIVE
Spec Grav, UA: 1.015 (ref 1.010–1.025)
Urobilinogen, UA: 0.2 E.U./dL
pH, UA: 7 (ref 5.0–8.0)

## 2020-10-09 MED ORDER — DIAZEPAM 5 MG PO TABS: ORAL_TABLET | ORAL | 0 refills | Status: DC

## 2020-10-09 NOTE — Patient Instructions (Addendum)
Constipation: Our goal is to achieve formed bowel movements daily or every-other-day.  You may need to try different combinations of the following options to find what works best for you - everybody's body works differently so feel free to adjust the dosages as needed.  Some options to help maintain bowel health include:  Marland Kitchen Dietary changes (more leafy greens, vegetables and fruits; less processed foods) . Fiber supplementation (Benefiber, FiberCon, Metamucil or Psyllium). Start slow and increase gradually to full dose. . Over-the-counter agents such as: stool softeners (Docusate or Colace) and/or laxatives (Miralax, milk of magnesia)  . "Power Pudding" is a natural mixture that may help your constipation.  To make blend 1 cup applesauce, 1 cup wheat bran, and 3/4 cup prune juice, refrigerate and then take 1 tablespoon daily with a large glass of water as needed.   I will prescribe valium 5 mg pills to place vaginally up to 2 times a day for vaginal muscle spasms. Start at night and take one every night for the next week to see if it improves your symptoms. Once you are improving you can taper off the medication and just use as needed. If the medication makes you drowsy then only use at bedtime and/or we can reduce the dose. Do not use gel to place it, as that will prevent the tablet from dissolving.  Instead place 1-2 drops of water on the table before inserting it in the vagina. If the pills do not dissolve well we can switch to a special compounded suppository. Let me know how you are doing on the medication and if you have any questions.

## 2020-10-09 NOTE — Telephone Encounter (Signed)
Spoke with pt regarding Rx for pain medication. Pt states that pharmacy would not fill because needed change in order.  Call to pharmacy and they states direction must use 1 tab q 6 hour for pain.  Change was made to Rx today and they will fill.  Pt made aware of correction made to Rx.  Advised if pain is not controlled she will need to contact office or make appt.   Pt states understanding.

## 2020-10-11 ENCOUNTER — Other Ambulatory Visit: Payer: Self-pay

## 2020-10-11 ENCOUNTER — Ambulatory Visit (INDEPENDENT_AMBULATORY_CARE_PROVIDER_SITE_OTHER): Payer: BC Managed Care – PPO

## 2020-10-11 ENCOUNTER — Other Ambulatory Visit (HOSPITAL_COMMUNITY)
Admission: RE | Admit: 2020-10-11 | Discharge: 2020-10-11 | Disposition: A | Discharge status: Home / Self Care | Payer: BC Managed Care – PPO | Source: Ambulatory Visit | Attending: Obstetrics | Admitting: Obstetrics

## 2020-10-11 DIAGNOSIS — N926 Irregular menstruation, unspecified: Secondary | ICD-10-CM

## 2020-10-11 DIAGNOSIS — N898 Other specified noninflammatory disorders of vagina: Secondary | ICD-10-CM | POA: Insufficient documentation

## 2020-10-11 DIAGNOSIS — Z113 Encounter for screening for infections with a predominantly sexual mode of transmission: Secondary | ICD-10-CM | POA: Insufficient documentation

## 2020-10-11 LAB — POCT URINE PREGNANCY: Preg Test, Ur: NEGATIVE

## 2020-10-11 NOTE — Progress Notes (Signed)
Patient was assessed and managed by nursing staff during this encounter. I have reviewed the chart and agree with the documentation and plan. I have also made any necessary editorial changes.  Griffin Basil, MD 10/11/2020 10:10 AM

## 2020-10-11 NOTE — Progress Notes (Signed)
Patient presents for STD testing including blood work. Patient denies having any vaginal discharge, odor, or irritation. She does complains of vaginal itching. She is also requesting a UPT be done. Patient has irregular cycles with last cycle being in 11/2019, but does report some spotting 2 days ago that lasted for only 1 day.

## 2020-10-12 LAB — HEPATITIS C ANTIBODY: Hep C Virus Ab: <0.1 s/co ratio (ref 0.0–0.9)

## 2020-10-12 LAB — HEPATITIS B SURFACE ANTIGEN: Hepatitis B Surface Ag: NEGATIVE

## 2020-10-12 LAB — RPR: RPR Ser Ql: NONREACTIVE

## 2020-10-12 LAB — HIV ANTIBODY (ROUTINE TESTING W REFLEX): HIV Screen 4th Generation wRfx: NONREACTIVE

## 2020-10-15 DIAGNOSIS — M129 Arthropathy, unspecified: Secondary | ICD-10-CM | POA: Diagnosis not present

## 2020-10-15 LAB — CERVICOVAGINAL ANCILLARY ONLY
Bacterial Vaginitis (gardnerella): NEGATIVE
Candida Glabrata: NEGATIVE
Candida Vaginitis: NEGATIVE
Chlamydia: NEGATIVE
Comment: NEGATIVE
Comment: NEGATIVE
Comment: NEGATIVE
Comment: NEGATIVE
Comment: NEGATIVE
Comment: NORMAL
Neisseria Gonorrhea: NEGATIVE
Trichomonas: NEGATIVE

## 2020-10-16 ENCOUNTER — Other Ambulatory Visit (HOSPITAL_COMMUNITY): Payer: BC Managed Care – PPO

## 2020-10-16 DIAGNOSIS — H33011 Retinal detachment with single break, right eye: Secondary | ICD-10-CM | POA: Diagnosis not present

## 2020-10-16 DIAGNOSIS — H35413 Lattice degeneration of retina, bilateral: Secondary | ICD-10-CM | POA: Diagnosis not present

## 2020-10-16 LAB — HM DIABETES EYE EXAM

## 2020-10-17 NOTE — Progress Notes (Signed)
I, Maureen Lewis, LAT, ATC acting as a scribe for Maureen Graham, MD.  Maureen Lewis is a 24 y.o. female who presents to [facility] at [facility] today for f/u R ankle pain (subacute posterior malleolus fx) after injuring her ankle when she fell down some stairs on 07/10/20. Pt was last seen by Dr. [name] on 09/18/20 and was advised to cont CAM walker boot and remain out of work. Today, pt reports R ankle is about the same. Pt has been compliant w/ wearing boot. When she takes boot off to ice and do HEP, she c/o very bad pain. Pt c/o numbness in foot, both in and out of the boot. She works at [employer] and has been unable to work since her accident in September.  Dx imaging: 09/07/20 R ankle MRI                     08/07/20 R ankle XR                       07/10/20 R ankle XR  Pertinent review of systems: No fevers or chills  Relevant historical information: Hypertension, morbid obesity   Exam:  BP 128/78 (BP Location: Right Arm, Patient Position: Sitting, Cuff Size: Large)   Pulse (!) 103   Ht 6\' 2"  (1.88 m)   Wt (!) 467 lb 12.8 oz (212.2 kg)   SpO2 97%   BMI 60.06 kg/m  General: Well Developed, well nourished, and in no acute distress.   MSK: Right foot and ankle normal-appearing tender palpation posterior ankle medial and lateral. Pain with talar tilt.    Lab and Radiology Results   EXAM: MRI OF THE RIGHT ANKLE WITHOUT CONTRAST  TECHNIQUE: Multiplanar, multisequence MR imaging of the ankle was performed. No intravenous contrast was administered.  COMPARISON:  None.  FINDINGS: TENDONS  Peroneal: Peroneal longus tendon intact. Peroneal brevis intact.  Posteromedial: Posterior tibial tendon intact. Flexor hallucis longus tendon intact. Flexor digitorum longus tendon intact.  Anterior: Tibialis anterior tendon intact. Extensor hallucis longus tendon intact Extensor digitorum longus tendon intact.  Achilles:  Intact.  Plantar  Fascia: Intact.  LIGAMENTS  Lateral: Anterior talofibular ligament intact. Calcaneofibular ligament intact. Posterior talofibular ligament intact. Anterior and posterior tibiofibular ligaments intact.  Medial: Deltoid ligament intact. Spring ligament intact.  CARTILAGE  Ankle Joint: No joint effusion. Normal ankle mortise. No chondral defect.  Subtalar Joints/Sinus Tarsi: Normal subtalar joints. No subtalar joint effusion. Normal sinus tarsi.  Bones: Subacute nondisplaced fracture of the posterior malleolus of the distal tibia with mild surrounding marrow edema. No acute fracture or dislocation.  Soft Tissue: No fluid collection or hematoma. Muscles are normal without edema or atrophy. Tarsal tunnel is normal.  IMPRESSION: 1. Subacute nondisplaced fracture of the posterior malleolus of the distal tibia with mild surrounding marrow edema.   Electronically Signed   By:   On: 09/10/2020 15:23  I, 09/12/2020, personally (independently) visualized and performed the interpretation of the images attached in this note.     Assessment and Plan: 24 y.o. female with right ankle pain occurring since a fall in September. Initially x-rays were normal however subsequent MRI showed a fracture of the posterior malleolus. She has been immobilized for over 2 months now is still having quite a bit of pain and not improving at all. I think at this point is worthwhile having a surgical opinion/second opinion. Will refer to foot and ankle  orthopedic surgery for second opinion/surgical opinion. Check back with me if surgery not indicated at about a month. Continue out of work.   PDMP not reviewed this encounter. Orders Placed This Encounter  Procedures  . Ambulatory referral to Orthopedic Surgery    Referral Priority:   Routine    Referral Type:   Surgical    Referral Reason:   Specialty Services Required    Referred to Provider:   Erle Crocker, MD     Requested Specialty:   Orthopedic Surgery    Number of Visits Requested:   1   No orders of the defined types were placed in this encounter.    Discussed warning signs or symptoms. Please see discharge instructions. Patient expresses understanding.   The above documentation has been reviewed and is accurate and complete [name], M.D.

## 2020-10-18 ENCOUNTER — Ambulatory Visit (INDEPENDENT_AMBULATORY_CARE_PROVIDER_SITE_OTHER): Payer: BC Managed Care – PPO | Admitting: Family Medicine

## 2020-10-18 ENCOUNTER — Other Ambulatory Visit: Payer: Self-pay

## 2020-10-18 VITALS — BP 128/78 | HR 103 | Ht 74.0 in | Wt >= 6400 oz

## 2020-10-18 DIAGNOSIS — S82891A Other fracture of right lower leg, initial encounter for closed fracture: Secondary | ICD-10-CM

## 2020-10-18 NOTE — Patient Instructions (Signed)
Thank you for coming in today.  Plan for surgical second opinion.   You should hear from Dr Donnie Mesa office [facility]) soon.   Let me know if you do not hear anything.

## 2020-10-24 ENCOUNTER — Ambulatory Visit (INDEPENDENT_AMBULATORY_CARE_PROVIDER_SITE_OTHER): Payer: Medicaid Other

## 2020-10-24 ENCOUNTER — Encounter: Payer: Self-pay | Admitting: Adult Health

## 2020-10-24 ENCOUNTER — Other Ambulatory Visit (HOSPITAL_COMMUNITY)
Admission: RE | Admit: 2020-10-24 | Discharge: 2020-10-24 | Disposition: A | Discharge status: Home / Self Care | Payer: Medicaid Other | Source: Ambulatory Visit | Attending: Adult Health | Admitting: Adult Health

## 2020-10-24 ENCOUNTER — Ambulatory Visit (INDEPENDENT_AMBULATORY_CARE_PROVIDER_SITE_OTHER): Payer: Medicaid Other | Admitting: Adult Health

## 2020-10-24 ENCOUNTER — Other Ambulatory Visit: Payer: Self-pay

## 2020-10-24 VITALS — BP 114/70 | Temp 98.9°F | Wt >= 6400 oz

## 2020-10-24 DIAGNOSIS — J189 Pneumonia, unspecified organism: Secondary | ICD-10-CM

## 2020-10-24 DIAGNOSIS — T7421XA Adult sexual abuse, confirmed, initial encounter: Secondary | ICD-10-CM

## 2020-10-24 DIAGNOSIS — Z113 Encounter for screening for infections with a predominantly sexual mode of transmission: Secondary | ICD-10-CM | POA: Diagnosis not present

## 2020-10-24 DIAGNOSIS — R0602 Shortness of breath: Secondary | ICD-10-CM | POA: Diagnosis not present

## 2020-10-24 DIAGNOSIS — R059 Cough, unspecified: Secondary | ICD-10-CM | POA: Diagnosis not present

## 2020-10-24 LAB — CBC WITH DIFFERENTIAL/PLATELET
Basophils Absolute: 0 10*3/uL (ref 0.0–0.1)
Basophils Relative: 0.3 % (ref 0.0–3.0)
Eosinophils Absolute: 0 10*3/uL (ref 0.0–0.7)
Eosinophils Relative: 0.6 % (ref 0.0–5.0)
HCT: 39 % (ref 36.0–46.0)
Hemoglobin: 12.3 g/dL (ref 12.0–15.0)
Lymphocytes Relative: 22.5 % (ref 12.0–46.0)
Lymphs Abs: 1.6 10*3/uL (ref 0.7–4.0)
MCHC: 31.7 g/dL (ref 30.0–36.0)
MCV: 71.3 fl — ABNORMAL LOW (ref 78.0–100.0)
Monocytes Absolute: 0.4 10*3/uL (ref 0.1–1.0)
Monocytes Relative: 6 % (ref 3.0–12.0)
Neutro Abs: 5 10*3/uL (ref 1.4–7.7)
Neutrophils Relative %: 70.6 % (ref 43.0–77.0)
Platelets: 256 10*3/uL (ref 150.0–400.0)
RBC: 5.47 Mil/uL — ABNORMAL HIGH (ref 3.87–5.11)
RDW: 16.4 % — ABNORMAL HIGH (ref 11.5–15.5)
WBC: 7.1 10*3/uL (ref 4.0–10.5)

## 2020-10-24 LAB — HCG, QUANTITATIVE, PREGNANCY: Quantitative HCG: <0.6 m[IU]/mL

## 2020-10-24 MED ORDER — FLUCONAZOLE 150 MG PO TABS: 150.0000 mg | ORAL_TABLET | Freq: Once | ORAL | 0 refills | Status: AC
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
## 2020-10-25 ENCOUNTER — Ambulatory Visit (INDEPENDENT_AMBULATORY_CARE_PROVIDER_SITE_OTHER): Payer: BC Managed Care – PPO | Admitting: Family Medicine

## 2020-10-25 DIAGNOSIS — N915 Oligomenorrhea, unspecified: Secondary | ICD-10-CM | POA: Diagnosis not present

## 2020-10-25 DIAGNOSIS — M25571 Pain in right ankle and joints of right foot: Secondary | ICD-10-CM | POA: Diagnosis not present

## 2020-10-25 DIAGNOSIS — Z79899 Other long term (current) drug therapy: Secondary | ICD-10-CM | POA: Diagnosis not present

## 2020-10-25 DIAGNOSIS — M542 Cervicalgia: Secondary | ICD-10-CM | POA: Diagnosis not present

## 2020-10-25 DIAGNOSIS — M419 Scoliosis, unspecified: Secondary | ICD-10-CM | POA: Diagnosis not present

## 2020-10-25 DIAGNOSIS — M545 Low back pain, unspecified: Secondary | ICD-10-CM | POA: Diagnosis not present

## 2020-10-25 LAB — HIV ANTIBODY (ROUTINE TESTING W REFLEX): HIV 1&2 Ab, 4th Generation: NONREACTIVE

## 2020-10-25 LAB — RPR: RPR Ser Ql: NONREACTIVE

## 2020-10-26 DIAGNOSIS — S8251XA Displaced fracture of medial malleolus of right tibia, initial encounter for closed fracture: Secondary | ICD-10-CM | POA: Diagnosis not present

## 2020-10-28 LAB — URINE CYTOLOGY ANCILLARY ONLY
Bacterial Vaginitis-Urine: NEGATIVE
Candida Urine: NEGATIVE
Chlamydia: NEGATIVE
Comment: NEGATIVE
Comment: NEGATIVE
Comment: NORMAL
Neisseria Gonorrhea: NEGATIVE
Trichomonas: NEGATIVE

## 2020-10-29 ENCOUNTER — Encounter (INDEPENDENT_AMBULATORY_CARE_PROVIDER_SITE_OTHER): Payer: Self-pay

## 2020-10-29 ENCOUNTER — Ambulatory Visit (INDEPENDENT_AMBULATORY_CARE_PROVIDER_SITE_OTHER): Payer: Medicaid Other | Admitting: Family Medicine

## 2020-11-06 ENCOUNTER — Encounter: Payer: Medicaid Other | Admitting: Adult Health

## 2020-11-07 DIAGNOSIS — M797 Fibromyalgia: Secondary | ICD-10-CM | POA: Diagnosis not present

## 2020-11-07 NOTE — Progress Notes (Signed)
Subjective:    Patient ID: Maureen Lewis, female    DOB: [birth year], 25 y.o.   MRN: [id number]  HPI Patient presents for yearly preventative medicine examination. She is a pleasant 25 year old female who  has a past medical history of Abnormal uterine bleeding, Acid reflux, Alpha thalassemia trait, Amenorrhea, Anemia, Back pain, Cesarean delivery delivered (10/11/2019), Chest pain, Complication of anesthesia, Constipation, Depression, Finger mass, left (03/2017), Gestational diabetes, Gestational diabetes (05/23/2019), Gestational hypertension (09/23/2019), Headache, Hypertension, Infection, Irritable bowel syndrome (IBS), Joint pain, Morbid obesity with body mass index (BMI) of 45.0 to 49.9 in adult [facility]), Obesity during pregnancy, antepartum (04/06/2019), Plantar fasciitis, bilateral, Polycystic ovary syndrome, Prediabetes, Pregnancy induced hypertension, Shortness of breath, Sleep apnea, Supervision of normal first pregnancy (04/06/2019), and Vertigo (2017).  Hypertension -currently prescribed Norvasc10 mg and HCTZ 25 mg.  Her blood pressure has been well controlled on this medication.  She denies symptoms of hypo or hypertension. BP Readings from Last 3 Encounters:  11/08/20 114/70  10/24/20 114/70  10/18/20 128/78   Migraine Headaches - is seen by Dr. Maia Petties - currently prescribed Topamax 25mg  BID and Elavil 50 mg QHS. Reports that her headaches are well controlled.   Metabolic Syndrome - is seen by healthy weight loss and wellness clinic has not been seen in the last month she has been getting over pneumonia.  Was last prescribed Victoza but could not get this approved through her insurance. Wt Readings from Last 3 Encounters:  11/08/20 (!) 475 lb (215.5 kg)  10/24/20 (!) 466 lb (211.4 kg)  10/18/20 (!) 467 lb 12.8 oz (212.2 kg)   Breast Discharge -ports over the last week or so she has had intermittent episodes of breast discharge from both breasts.  She does report pain and  localized erythremia at times.  Has not breast-fed in the last year.  OSA - continues to wait on CPAP to come in d/t supply chain issues   All immunizations and health maintenance protocols were reviewed with the patient and needed orders were placed.  Appropriate screening laboratory values were ordered for the patient including screening of hyperlipidemia, renal function and hepatic function.  Medication reconciliation,  past medical history, social history, problem list and allergies were reviewed in detail with the patient  Goals were established with regard to weight loss, exercise, and  diet in compliance with medications  See is up to date on routine GYN care  Review of Systems  Constitutional: Negative.   HENT: Negative.   Eyes: Negative.   Respiratory: Positive for shortness of breath (chronic since COVID 19 infection ).   Cardiovascular: Negative.   Gastrointestinal: Negative.   Endocrine: Negative.   Genitourinary: Negative.   Musculoskeletal: Negative.   Skin: Negative.   Allergic/Immunologic: Negative.   Neurological: Negative.   Hematological: Negative.   Psychiatric/Behavioral: Negative.    Past Medical History:  Diagnosis Date  . Abnormal uterine bleeding   . Acid reflux   . Alpha thalassemia trait   . Amenorrhea   . Anemia    no current med.  . Back pain   . Cesarean delivery delivered 10/11/2019   10/09/2019 - primary CS for failed IOL  . Chest pain   . Complication of anesthesia    states had to keep giving her anesthesia during EGD  . Constipation   . Depression    "I'm good"  . Finger mass, left 03/2017   middle finger  . Gestational diabetes   . Gestational diabetes 05/23/2019  Current Diabetic Medications:  None  [ ]  Aspirin 81 mg daily after 12 weeks (? A2/B GDM)  Required Referrals for A1GDM or A2GDM: [ ]  Diabetes Education and Testing Supplies [ ]  Nutrition Cousult  For A2/B GDM or higher classes of DM [ ]  Diabetes Education and Testing  Supplies [ ]  Nutrition Counsult [ ]  Fetal ECHO after 20 weeks  [ ]  Eye exam for retina evaluation   Baseline and surveillance labs (  . Gestational hypertension 09/23/2019   Guidelines for Antenatal Testing and Sonography  (with updated ICD-10 codes)  Updated  10-05-19 with Dr. Tama High  INDICATION U/S 2 X week NST/AFI  or full BPP wkly DELIVERY Diabetes   A1 - good control - O24.410    A2 - good control - O24.419      A2  - poor control or poor compliance - O24.419, E11.65   (Macrosomia or polyhydramnios) **E11.65 is extra code for poor control**    A2/B - O24.  Marland Kitchen Headache   . Hypertension   . Infection    UTI  . Irritable bowel syndrome (IBS)   . Joint pain   . Morbid obesity with body mass index (BMI) of 45.0 to 49.9 in adult [facility])   . Obesity during pregnancy, antepartum 04/06/2019   Body mass index is 53.71 kg/m.  Recommendations [x]  Aspirin 81 mg daily after 12 weeks; discontinue after 36 weeks [ ]  Nutrition consult [ ]  Weight gain 11-20 lbs for singleton and 25-35 lbs for twin pregnancy (IOM guidelines) Higher class of obesity patients recommended to gain closer to lower limit  Weight loss is associated with adverse outcomes [ ]  Baseline and surveillance labs (pulled in fr  . Plantar fasciitis, bilateral   . Polycystic ovary syndrome   . Prediabetes    "prediabetes"  . Pregnancy induced hypertension   . Shortness of breath   . Sleep apnea    no CPAP use  . Supervision of normal first pregnancy 04/06/2019   [name]: [ x]Initial [ x]12 [ ] 20 [ ] 28 [ ] 32 [ ] 36 [ ] 38 [ ] 39 [ ] 40 Nursing Staff Provider Office Location [facility]  Dating   LMP Language  English  Anatomy US  Nml Flu Vaccine  Declined 07/28/19 Genetic Screen  NIPS: low risks   AFP:   negative  TDaP vaccine   08/25/19 Hgb A1C or  GTT Early A1C 5.8 Third trimester: GDM insulin Rhogam  NA   LAB RESULTS  Feeding Plan Breast Blood Type  . Vertigo 2017    Social History   Socioeconomic History  . Marital status:  Single    Spouse name: Not on file  . Number of children: Not on file  . Years of education: Not on file  . Highest education level: Not on file  Occupational History  . 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 Smoking status: Never Smoker  . Smokeless tobacco: Never Used  Vaping Use  . Vaping Use: Never used  Substance and Sexual Activity  . Alcohol use: Not Currently    Comment: social   . Drug use: No  . Sexual activity: Yes    Birth control/protection: None, Condom  Other Topics Concern  . Not on file  Social History Narrative   Right handed    Soda sometimes   Lives with mom and cousin, a grandmother in [place] also helps care for her.       She is in nursing  school.    Social Determinants of Health   Financial Resource Strain: Not on file  Food Insecurity: Not on file  Transportation Needs: Not on file  Physical Activity: Not on file  Stress: Not on file  Social Connections: Not on file  Intimate Partner Violence: Not on file    Past Surgical History:  Procedure Laterality Date  . CESAREAN SECTION N/A 10/09/2019   Procedure: CESAREAN SECTION;  Surgeon: Aletha Halim, MD;  Location: [facility];  Service: Obstetrics;  Laterality: N/A;  . COLONOSCOPY WITH PROPOFOL  10/07/2016  . ESOPHAGOGASTRODUODENOSCOPY  12/21/2015  . EXCISION MASS UPPER EXTREMETIES Left 04/21/2017   Procedure: EXCISION MASS LEFT MIDDLE FINGER;  Surgeon: Daryll Brod, MD;  Location: [place];  Service: Orthopedics;  Laterality: Left;  . IR FL GUIDED LOC OF NEEDLE/CATH TIP FOR SPINAL INJECTION RT  03/13/2020    Family History  Problem Relation Age of Onset  . Diabetes Maternal Aunt   . Hypertension Maternal Uncle   . Depression Maternal Grandfather   . Diabetes Maternal Grandfather   . Hypertension Maternal Grandfather   . Arthritis Mother   . High blood pressure Mother   . Depression Mother   . Sleep apnea Mother   . Obesity Mother   .  Diabetes Mother   . Hypertension Mother   . Kidney failure Mother   . Sleep apnea Father   . Obesity Father   . Healthy Daughter     Allergies  Allergen Reactions  . Bee Pollen Hives, Shortness Of Breath and Swelling  . Bee Venom Hives, Shortness Of Breath and Swelling  . Hydrocodone-Acetaminophen Anaphylaxis    No problem when she takes Tylenol  . Peach Flavor Hives, Shortness Of Breath and Other (See Comments)    SWELLING OF MOUTH  . Pollen Extract Hives, Shortness Of Breath and Swelling  . Remdesivir Swelling    Angioedema 8/25    Current Outpatient Medications on File Prior to Visit  Medication Sig Dispense Refill  . acetaZOLAMIDE (DIAMOX) 500 MG capsule Take 1 capsule (500 mg total) by mouth 2 (two) times daily. For headache 60 capsule 2  . albuterol (VENTOLIN HFA) 108 (90 Base) MCG/ACT inhaler Inhale 2 puffs into the lungs every 6 (six) hours as needed for wheezing or shortness of breath. 8 g 0  . amLODipine (NORVASC) 10 MG tablet Take 1 tablet (10 mg total) by mouth daily. 30 tablet 2  . baclofen (LIORESAL) 10 MG tablet Take 1 tablet (10 mg total) by mouth 2 (two) times daily. 30 each 0  . diclofenac (VOLTAREN) 75 MG EC tablet Take 1 tablet (75 mg total) by mouth 2 (two) times daily. 60 tablet 1  . hydrochlorothiazide (HYDRODIURIL) 25 MG tablet Take 1 tablet (25 mg total) by mouth daily. 30 tablet 2  . loratadine (CLARITIN) 10 MG tablet Take 10 mg by mouth daily as needed for allergies.     Marland Kitchen nortriptyline (PAMELOR) 25 MG capsule Take 25 mg by mouth 2 (two) times daily.    Marland Kitchen oxyCODONE-acetaminophen (PERCOCET/ROXICET) 5-325 MG tablet Take 1-2 tablets by mouth every 6 (six) hours as needed for severe pain. 20 tablet 0  . polyethylene glycol powder (GLYCOLAX/MIRALAX) 17 GM/SCOOP powder Take 17 g by mouth daily as needed for moderate constipation.     . topiramate (TOPAMAX) 25 MG tablet Take 2 tablets (50 mg total) by mouth 2 (two) times daily. 180 tablet 1  . Vitamin D,  Ergocalciferol, (DRISDOL) 1.25 MG (50000 UNIT)  CAPS capsule Take 1 capsule (50,000 Units total) by mouth every 7 (seven) days. 4 capsule 0  . ferrous sulfate 325 (65 FE) MG tablet Take 1 tablet (325 mg total) by mouth every other day. 45 tablet 1   No current facility-administered medications on file prior to visit.    BP 114/70   Temp 98.8 F (37.1 C)   Wt (!) 475 lb (215.5 kg)   BMI 60.99 kg/m       Objective:   Physical Exam Vitals and nursing note reviewed. Exam conducted with a chaperone present.  Constitutional:      General: She is not in acute distress.    Appearance: Normal appearance. She is well-developed. She is not ill-appearing.  HENT:     Head: Normocephalic and atraumatic.     Right Ear: Tympanic membrane, ear canal and external ear normal. There is no impacted cerumen.     Left Ear: Tympanic membrane, ear canal and external ear normal. There is no impacted cerumen.     Nose: Nose normal. No congestion or rhinorrhea.     Mouth/Throat:     Mouth: Mucous membranes are moist.     Pharynx: Oropharynx is clear. No oropharyngeal exudate or posterior oropharyngeal erythema.  Eyes:     General:        Right eye: No discharge.        Left eye: No discharge.     Extraocular Movements: Extraocular movements intact.     Conjunctiva/sclera: Conjunctivae normal.     Pupils: Pupils are equal, round, and reactive to light.  Neck:     Thyroid: No thyromegaly.     Vascular: No carotid bruit.     Trachea: No tracheal deviation.  Cardiovascular:     Rate and Rhythm: Normal rate and regular rhythm.     Pulses: Normal pulses.     Heart sounds: Normal heart sounds. No murmur heard. No friction rub. No gallop.   Pulmonary:     Effort: Pulmonary effort is normal. No respiratory distress.     Breath sounds: Normal breath sounds. No stridor. No wheezing, rhonchi or rales.  Chest:     Chest wall: Tenderness present.  Breasts: Breasts are symmetrical.     Right: Tenderness  present. No swelling, inverted nipple, mass, nipple discharge, skin change, axillary adenopathy or supraclavicular adenopathy.     Left: Tenderness present. No swelling, inverted nipple, mass, nipple discharge, skin change, axillary adenopathy or supraclavicular adenopathy.    Abdominal:     General: Abdomen is flat. Bowel sounds are normal. There is no distension.     Palpations: Abdomen is soft. There is no mass.     Tenderness: There is no abdominal tenderness. There is no right CVA tenderness, left CVA tenderness, guarding or rebound.     Hernia: No hernia is present.  Musculoskeletal:        General: No swelling, tenderness, deformity or signs of injury. Normal range of motion.     Cervical back: Normal range of motion and neck supple.     Right lower leg: No edema.     Left lower leg: No edema.  Lymphadenopathy:     Cervical: No cervical adenopathy.     Upper Body:     Right upper body: No supraclavicular, axillary or pectoral adenopathy.     Left upper body: No supraclavicular, axillary or pectoral adenopathy.  Skin:    General: Skin is warm and dry.     Coloration: Skin is not  jaundiced or pale.     Findings: No bruising, erythema, lesion or rash.  Neurological:     General: No focal deficit present.     Mental Status: She is alert and oriented to person, place, and time.     Cranial Nerves: No cranial nerve deficit.     Sensory: No sensory deficit.     Motor: No weakness.     Coordination: Coordination normal.     Gait: Gait normal.     Deep Tendon Reflexes: Reflexes normal.  Psychiatric:        Mood and Affect: Mood normal.        Behavior: Behavior normal.        Thought Content: Thought content normal.        Judgment: Judgment normal.       Assessment & Plan:  1. Routine general medical examination at a health care facility - Needs significant weight loss through diet and exercise - Follow up in one year for CPE or sooner if needed  - CBC with  Differential/Platelet; Future - Comprehensive metabolic panel; Future - Lipid panel; Future - TSH; Future - Hemoglobin A1c; Future - Hemoglobin A1c - TSH - Lipid panel - Comprehensive metabolic panel - CBC with Differential/Platelet  2. Metabolic syndrome - CBC with Differential/Platelet; Future - Comprehensive metabolic panel; Future - Lipid panel; Future - TSH; Future - Hemoglobin A1c; Future - Hemoglobin A1c - TSH - Lipid panel - Comprehensive metabolic panel - CBC with Differential/Platelet  3. Essential hypertension - Well controlled. No change in medications  - amLODipine (NORVASC) 10 MG tablet; Take 1 tablet (10 mg total) by mouth daily.  Dispense: 90 tablet; Refill: 3 - hydrochlorothiazide (HYDRODIURIL) 25 MG tablet; Take 1 tablet (25 mg total) by mouth daily.  Dispense: 90 tablet; Refill: 3 - CBC with Differential/Platelet; Future - Comprehensive metabolic panel; Future - Lipid panel; Future - TSH; Future - Hemoglobin A1c; Future - Hemoglobin A1c - TSH - Lipid panel - Comprehensive metabolic panel - CBC with Differential/Platelet  4. Class 3 severe obesity with serious comorbidity and body mass index (BMI) of 60.0 to 69.9 in adult, unspecified obesity type ([place]) - Follow up with healthy weight and wellness.  - Encouraged diet and exercise  - CBC with Differential/Platelet; Future - Comprehensive metabolic panel; Future - Lipid panel; Future - TSH; Future - Hemoglobin A1c; Future - Hemoglobin A1c - TSH - Lipid panel - Comprehensive metabolic panel - CBC with Differential/Platelet  5. OSA (obstructive sleep apnea)  - CBC with Differential/Platelet; Future - Comprehensive metabolic panel; Future - Lipid panel; Future - TSH; Future - Hemoglobin A1c; Future - Hemoglobin A1c - TSH - Lipid panel - Comprehensive metabolic panel - CBC with Differential/Platelet  6. Other migraine without status migrainosus, not intractable - Follow up with Neurology  as directed. Continue with Topamax and Elavil  - CBC with Differential/Platelet; Future - Comprehensive metabolic panel; Future - Lipid panel; Future - TSH; Future - Hemoglobin A1c; Future - Hemoglobin A1c - TSH - Lipid panel - Comprehensive metabolic panel - CBC with Differential/Platelet  7. Nipple discharge in female - No abnormalities noted. Will have her follow up with GYN   Dorothyann Peng, NP

## 2020-11-08 ENCOUNTER — Other Ambulatory Visit: Payer: Self-pay

## 2020-11-08 ENCOUNTER — Encounter: Payer: Self-pay | Admitting: Adult Health

## 2020-11-08 ENCOUNTER — Ambulatory Visit (INDEPENDENT_AMBULATORY_CARE_PROVIDER_SITE_OTHER): Payer: Medicaid Other | Admitting: Adult Health

## 2020-11-08 VITALS — BP 114/70 | Temp 98.8°F | Wt >= 6400 oz

## 2020-11-08 DIAGNOSIS — G43809 Other migraine, not intractable, without status migrainosus: Secondary | ICD-10-CM | POA: Diagnosis not present

## 2020-11-08 DIAGNOSIS — G4733 Obstructive sleep apnea (adult) (pediatric): Secondary | ICD-10-CM

## 2020-11-08 DIAGNOSIS — Z6841 Body Mass Index (BMI) 40.0 and over, adult: Secondary | ICD-10-CM | POA: Diagnosis not present

## 2020-11-08 DIAGNOSIS — N6452 Nipple discharge: Secondary | ICD-10-CM

## 2020-11-08 DIAGNOSIS — Z0000 Encounter for general adult medical examination without abnormal findings: Secondary | ICD-10-CM

## 2020-11-08 DIAGNOSIS — I10 Essential (primary) hypertension: Secondary | ICD-10-CM | POA: Diagnosis not present

## 2020-11-08 DIAGNOSIS — E8881 Metabolic syndrome: Secondary | ICD-10-CM | POA: Diagnosis not present

## 2020-11-08 LAB — CBC WITH DIFFERENTIAL/PLATELET
Basophils Absolute: 0 10*3/uL (ref 0.0–0.1)
Basophils Relative: 0.4 % (ref 0.0–3.0)
Eosinophils Absolute: 0 10*3/uL (ref 0.0–0.7)
Eosinophils Relative: 0.5 % (ref 0.0–5.0)
HCT: 37.9 % (ref 36.0–46.0)
Hemoglobin: 12.2 g/dL (ref 12.0–15.0)
Lymphocytes Relative: 25.1 % (ref 12.0–46.0)
Lymphs Abs: 1.7 10*3/uL (ref 0.7–4.0)
MCHC: 32.1 g/dL (ref 30.0–36.0)
MCV: 69.8 fl — ABNORMAL LOW (ref 78.0–100.0)
Monocytes Absolute: 0.4 10*3/uL (ref 0.1–1.0)
Monocytes Relative: 5.6 % (ref 3.0–12.0)
Neutro Abs: 4.7 10*3/uL (ref 1.4–7.7)
Neutrophils Relative %: 68.4 % (ref 43.0–77.0)
Platelets: 226 10*3/uL (ref 150.0–400.0)
RBC: 5.42 Mil/uL — ABNORMAL HIGH (ref 3.87–5.11)
RDW: 16.2 % — ABNORMAL HIGH (ref 11.5–15.5)
WBC: 6.9 10*3/uL (ref 4.0–10.5)

## 2020-11-08 LAB — COMPREHENSIVE METABOLIC PANEL
ALT: 14 U/L (ref 0–35)
AST: 11 U/L (ref 0–37)
Albumin: 4 g/dL (ref 3.5–5.2)
Alkaline Phosphatase: 88 U/L (ref 39–117)
BUN: 9 mg/dL (ref 6–23)
CO2: 26 mEq/L (ref 19–32)
Calcium: 9.2 mg/dL (ref 8.4–10.5)
Chloride: 107 mEq/L (ref 96–112)
Creatinine, Ser: 0.56 mg/dL (ref 0.40–1.20)
GFR: 129.65 mL/min (ref >60.00)
Glucose, Bld: 131 mg/dL — ABNORMAL HIGH (ref 70–99)
Potassium: 4 mEq/L (ref 3.5–5.1)
Sodium: 139 mEq/L (ref 135–145)
Total Bilirubin: 0.3 mg/dL (ref 0.2–1.2)
Total Protein: 7.6 g/dL (ref 6.0–8.3)

## 2020-11-08 LAB — LIPID PANEL
Cholesterol: 118 mg/dL (ref 0–200)
HDL: 36 mg/dL — ABNORMAL LOW (ref >39.00)
LDL Cholesterol: 69 mg/dL (ref 0–99)
NonHDL: 81.83
Total CHOL/HDL Ratio: 3
Triglycerides: 64 mg/dL (ref 0.0–149.0)
VLDL: 12.8 mg/dL (ref 0.0–40.0)

## 2020-11-08 LAB — TSH: TSH: 1.46 u[IU]/mL (ref 0.35–4.50)

## 2020-11-08 LAB — HEMOGLOBIN A1C: Hgb A1c MFr Bld: 6.1 % (ref 4.6–6.5)

## 2020-11-08 MED ORDER — HYDROCHLOROTHIAZIDE 25 MG PO TABS: 25.0000 mg | ORAL_TABLET | Freq: Every day | ORAL | 3 refills | Status: DC

## 2020-11-08 MED ORDER — AMLODIPINE BESYLATE 10 MG PO TABS: 10.0000 mg | ORAL_TABLET | Freq: Every day | ORAL | 3 refills | Status: DC

## 2020-11-08 NOTE — Patient Instructions (Signed)
It was great seeing you today   We will follow up with you regarding your blood work   Please follow up with GYN about the breast discharge

## 2020-11-13 NOTE — Progress Notes (Signed)
I, Wendy Poet, LAT, ATC, am serving as scribe for Dr. [name].  Maureen Lewis is a 25 y.o. female who presents to [place] [facility] today for headaches and f/u of R ankle pain.  She was last seen by Dr. Georgina Snell on 10/18/20 for f/u of R ankle pain (subacute post malleolus fx) and was referred to Ortho (Dr. Lucia Gaskins) for a 2nd opinion.  He recommends physical therapy.  She is waiting to try to figure out where she can go based on her insurance.  He anticipates a 6-week course of physical therapy before she can return to work.    Additionally pt reports HA have gotten more extreme and is unable to sleep at night.  She was seen for headache following a concussion.  Headache this led to be multifactorial due to concussion, pseudotumor cerebri, and sleep apnea.  This was managed by her neurologist.  She has not received her CPAP machine from her neurologist yet and has not followed back up with her neurologist.  She was prescribed acetazolamide and nortriptyline both of which have not been very helpful.    Dx imaging: 09/07/20 R ankle MRI  08/07/20 R ankle XR  07/10/20 R ankle XR  Pertinent review of systems: No fevers or chills  Relevant historical information: Sleep apnea, pseudotumor cerebri, obesity   Exam:  BP 136/86 (BP Location: Right Arm, Patient Position: Sitting, Cuff Size: Large)   Pulse (!) 112   Ht 6\' 2"  (1.88 m)   Wt (!) 477 lb (216.4 kg)   SpO2 97%   BMI 61.24 kg/m  General: Well Developed, well nourished, and in no acute distress.   MSK: Right ankle with ASO brace.  Decreased ankle motion.  Mild antalgic gait. Neuro: Alert and oriented normal coordination   Lab and Radiology Results  EXAM: MRI OF THE RIGHT ANKLE WITHOUT CONTRAST  TECHNIQUE: Multiplanar, multisequence MR imaging of the ankle was performed. No intravenous contrast was administered.  COMPARISON:  None.  FINDINGS: TENDONS  Peroneal: Peroneal longus tendon  intact. Peroneal brevis intact.  Posteromedial: Posterior tibial tendon intact. Flexor hallucis longus tendon intact. Flexor digitorum longus tendon intact.  Anterior: Tibialis anterior tendon intact. Extensor hallucis longus tendon intact Extensor digitorum longus tendon intact.  Achilles:  Intact.  Plantar Fascia: Intact.  LIGAMENTS  Lateral: Anterior talofibular ligament intact. Calcaneofibular ligament intact. Posterior talofibular ligament intact. Anterior and posterior tibiofibular ligaments intact.  Medial: Deltoid ligament intact. Spring ligament intact.  CARTILAGE  Ankle Joint: No joint effusion. Normal ankle mortise. No chondral defect.  Subtalar Joints/Sinus Tarsi: Normal subtalar joints. No subtalar joint effusion. Normal sinus tarsi.  Bones: Subacute nondisplaced fracture of the posterior malleolus of the distal tibia with mild surrounding marrow edema. No acute fracture or dislocation.  Soft Tissue: No fluid collection or hematoma. Muscles are normal without edema or atrophy. Tarsal tunnel is normal.  IMPRESSION: 1. Subacute nondisplaced fracture of the posterior malleolus of the distal tibia with mild surrounding marrow edema.   Electronically Signed   By: Kathreen Devoid   On: 09/10/2020 15:23 I, [name], personally (independently) visualized and performed the interpretation of the images attached in this note.     Assessment and Plan: 25 y.o. female with  Right ankle pain thought to be due to the posterior fracture seen on MRI previously.  At this point majority of care done with Dr. Lucia Gaskins.  Plan for conservative management with physical therapy.  We will go ahead and  place referral to physical therapy myself to avoid delay in care.. We will complete disability paperwork as well. Check back with myself after 2 months although patient does have follow-up appointment scheduled with Dr. Lucia Gaskins on February 18th.   Headache:  Multifactorial due to previous concussion, pseudotumor cerebri, and likely due to sleep apnea.  Recommend follow back up with neurology who should be managing the pseudotumor cerebri and the sleep apnea.  Recommend patient call her CPAP company as she has not yet received her CPAP.  This point I do not have any further treatment to offer for headache.   PDMP not reviewed this encounter. Orders Placed This Encounter  Procedures  . Ambulatory referral to Physical Therapy    Referral Priority:   Routine    Referral Type:   Physical Medicine    Referral Reason:   Specialty Services Required    Requested Specialty:   Physical Therapy   No orders of the defined types were placed in this encounter.    Discussed warning signs or symptoms. Please see discharge instructions. Patient expresses understanding.   The above documentation has been reviewed and is accurate and complete [name], M.D.

## 2020-11-14 ENCOUNTER — Ambulatory Visit (INDEPENDENT_AMBULATORY_CARE_PROVIDER_SITE_OTHER): Payer: Medicaid Other | Admitting: *Deleted

## 2020-11-14 ENCOUNTER — Other Ambulatory Visit: Payer: Self-pay

## 2020-11-14 ENCOUNTER — Ambulatory Visit (INDEPENDENT_AMBULATORY_CARE_PROVIDER_SITE_OTHER): Payer: Medicaid Other | Admitting: Family Medicine

## 2020-11-14 VITALS — BP 136/86 | HR 112 | Ht 74.0 in | Wt >= 6400 oz

## 2020-11-14 DIAGNOSIS — G932 Benign intracranial hypertension: Secondary | ICD-10-CM | POA: Diagnosis not present

## 2020-11-14 DIAGNOSIS — Z111 Encounter for screening for respiratory tuberculosis: Secondary | ICD-10-CM

## 2020-11-14 DIAGNOSIS — S82891A Other fracture of right lower leg, initial encounter for closed fracture: Secondary | ICD-10-CM | POA: Diagnosis not present

## 2020-11-14 NOTE — Patient Instructions (Signed)
Thank you for coming in today.  I will complete the paperwork by Friday. I am going extend your leave for 3 months.   Talk to your neurologist about headaches.   Call [employer] about your CPAP order. It should not have taken longer than 6 months.   Keep me updated.

## 2020-11-15 NOTE — Progress Notes (Deleted)
[facility] Return Visit  SUBJECTIVE  History of Present Illness: Maureen Lewis is a 25 y.o. female seen in follow-up for pelvic pain. Plan at last visit was to start vaginal valium 5mg  prn and she was also referred to physical therapy.     Past Medical History: Patient  has a past medical history of Abnormal uterine bleeding, Acid reflux, Alpha thalassemia trait, Amenorrhea, Anemia, Back pain, Cesarean delivery delivered (10/11/2019), Chest pain, Complication of anesthesia, Constipation, Depression, Finger mass, left (03/2017), Gestational diabetes, Gestational diabetes (05/23/2019), Gestational hypertension (09/23/2019), Headache, Hypertension, Infection, Irritable bowel syndrome (IBS), Joint pain, Morbid obesity with body mass index (BMI) of 45.0 to 49.9 in adult [facility]), Obesity during pregnancy, antepartum (04/06/2019), Plantar fasciitis, bilateral, Polycystic ovary syndrome, Prediabetes, Pregnancy induced hypertension, Shortness of breath, Sleep apnea, Supervision of normal first pregnancy (04/06/2019), and Vertigo (2017).   Past Surgical History: She  has a past surgical history that includes Esophagogastroduodenoscopy (12/21/2015); Colonoscopy with propofol (10/07/2016); Excision mass upper extremeties (Left, 04/21/2017); Cesarean section (N/A, 10/09/2019); and IR FL GUIDED LOC OF NEEDLE/CATH TIP FOR SPINAL INJECT RT (03/13/2020).   Medications: She has a current medication list which includes the following prescription(s): acetazolamide, albuterol, amlodipine, baclofen, diclofenac, ferrous sulfate, hydrochlorothiazide, loratadine, nortriptyline, oxycodone-acetaminophen, polyethylene glycol powder, topiramate, and vitamin d (ergocalciferol).   Allergies: Patient is allergic to bee pollen, bee venom, hydrocodone-acetaminophen, peach flavor, pollen extract, and remdesivir.   Social History: Patient  reports that she has never smoked. She has never used smokeless tobacco.  She reports previous alcohol use. She reports that she does not use drugs.      OBJECTIVE     Physical Exam: There were no vitals filed for this visit. Gen: No apparent distress, A&O x 3.  Detailed Urogynecologic Evaluation:  Deferred. Prior exam showed:  No flowsheet data found.     ASSESSMENT AND PLAN    Maureen Lewis is a 24 y.o. with:  No diagnosis found.

## 2020-11-16 LAB — TB SKIN TEST
Induration: 0 mm
TB Skin Test: NEGATIVE

## 2020-11-19 ENCOUNTER — Ambulatory Visit (INDEPENDENT_AMBULATORY_CARE_PROVIDER_SITE_OTHER): Payer: Medicaid Other | Admitting: Obstetrics and Gynecology

## 2020-11-19 ENCOUNTER — Encounter: Payer: Self-pay | Admitting: Obstetrics and Gynecology

## 2020-11-19 ENCOUNTER — Other Ambulatory Visit: Payer: Self-pay

## 2020-11-19 DIAGNOSIS — N643 Galactorrhea not associated with childbirth: Secondary | ICD-10-CM | POA: Insufficient documentation

## 2020-11-19 NOTE — Patient Instructions (Signed)
Galactorrhea Galactorrhea is the flow of a milky fluid (discharge) from the breast. It is different from normal milk in nursing mothers. The fluid can be white, yellow, or green. This condition can be caused by many things. Most cases are not serious and do not require treatment. Watch your condition to make sure it goes away. What are the causes?  Irritation of the breast. This may be caused by: ? Injury on the breast. ? Touching breasts during sex. ? Clothes rubbing against the nipple.  Some medicines, birth control pills, or herbs.  Changes in hormones.  Stress. What are the signs or symptoms? The main symptom of this condition is a milky discharge from the breast. The discharge may be white, yellow, or green. How is this treated? You may get well without treatment. Your doctor will watch your condition to make sure that it gets better. Sometimes your doctor will decide that you need treatment. If you need treatment:  The doctor will treat any injury or irritation on your breast.  The doctor will treat you for problems in your hormones.  You may be asked to stop some medicines, if they are causing your symptoms. Follow these instructions at home: Breast care  Watch your condition for any changes.  Do not squeeze your breasts or nipples.  Avoid touching your breasts when you are having sex.  Perform a breast self-exam once a month.  Avoid clothes that rub on your nipples.  Use breast pads to absorb the fluid.  Wear a support bra or a breast binder.   General instructions  Take over-the-counter and prescription medicines only as told by your doctor.  Keep all follow-up visits. Contact a doctor if:  You have hot flashes.  You have vaginal dryness.  You have no desire for sex.  You stop having periods, or have periods that are irregular or far apart.  You have headaches.  You cannot see well. Get help right away if:  Your breast discharge is bloody or  yellowish and looks like pus.  You have breast pain.  You feel a lump in your breast.  Your breast shows wrinkling or dimpling.  Your breast becomes red and swollen. Summary  Galactorrhea is the flow of a milky fluid (discharge) from the breast.  This condition may be caused by many things, but it is not usually serious.  Watch your condition carefully to make sure that it goes away.  Get help right away if the fluid is bloody or yellowish, or if you have a lump, pain, or skin changes on your breast. This information is not intended to replace advice given to you by your health care provider. Make sure you discuss any questions you have with your health care provider. Document Revised: 08/06/2020 Document Reviewed: 08/06/2020 Elsevier Patient Education  2021 Elsevier Inc.  

## 2020-11-19 NOTE — Progress Notes (Signed)
Pt c/o breast discharge w/ clear, milky color.

## 2020-11-19 NOTE — Progress Notes (Signed)
   Subjective:    Patient ID: Maureen Lewis, female    DOB: Sep 19, 1996, 25 y.o.   MRN: [id number]  HPI 25 yo G1P1 seen I office with report of 3 weeks of bilateral clear, milky breast leakage.  She notes the leakage every other day.  She still noted some irregular menses.  Pt does note using nortriptyline since 9/21.  This a known medication which can be associated with galoctorrhea   Review of Systems  Constitutional: Negative.   HENT: Negative.   Eyes: Negative.   Respiratory: Negative.   Cardiovascular: Negative.   Gastrointestinal: Negative.   Endocrine:       Bilateral nipple leakage  Genitourinary: Negative.   Neurological: Negative.        Objective:   Physical Exam Constitutional:      Appearance: Normal appearance. She is obese.  HENT:     Head: Normocephalic and atraumatic.  Cardiovascular:     Rate and Rhythm: Normal rate and regular rhythm.     Heart sounds: Normal heart sounds.  Pulmonary:     Effort: Pulmonary effort is normal.     Breath sounds: Normal breath sounds.  Skin:    Comments: No nipple leakage noted today, no obvious breast masses    Vitals:   11/19/20 1007  BP: (!) 152/88  Pulse: 99         Assessment & Plan:   1. Galactorrhea Will check serum prolactin and get breast ultrasound. If prolactin elevated, will consider MRI of brain and referral to endocrinology. Pt also can consider discontinuing nortiptyline - Prolactin - TSH - T4, free - US BREAST BILATERAL - US BREAST LTD UNI LEFT INC AXILLA; Future - US BREAST LTD UNI RIGHT INC AXILLA; Future  Follow up in 3 weeks with virtual visit  Griffin Basil, MD Faculty Attending, Center for [facility]

## 2020-11-20 ENCOUNTER — Ambulatory Visit: Payer: BC Managed Care – PPO | Admitting: Obstetrics and Gynecology

## 2020-11-20 DIAGNOSIS — M419 Scoliosis, unspecified: Secondary | ICD-10-CM | POA: Diagnosis not present

## 2020-11-20 DIAGNOSIS — M542 Cervicalgia: Secondary | ICD-10-CM | POA: Diagnosis not present

## 2020-11-20 DIAGNOSIS — N915 Oligomenorrhea, unspecified: Secondary | ICD-10-CM | POA: Diagnosis not present

## 2020-11-20 DIAGNOSIS — M545 Low back pain, unspecified: Secondary | ICD-10-CM | POA: Diagnosis not present

## 2020-11-20 DIAGNOSIS — Z79899 Other long term (current) drug therapy: Secondary | ICD-10-CM | POA: Diagnosis not present

## 2020-11-20 DIAGNOSIS — M25571 Pain in right ankle and joints of right foot: Secondary | ICD-10-CM | POA: Diagnosis not present

## 2020-11-20 LAB — TSH: TSH: 1.24 u[IU]/mL (ref 0.450–4.500)

## 2020-11-20 LAB — T4, FREE: Free T4: 1.08 ng/dL (ref 0.82–1.77)

## 2020-11-20 LAB — PROLACTIN: Prolactin: 8.3 ng/mL (ref 4.8–23.3)

## 2020-11-21 ENCOUNTER — Ambulatory Visit: Payer: Medicaid Other | Attending: Family Medicine

## 2020-11-21 ENCOUNTER — Other Ambulatory Visit: Payer: Self-pay

## 2020-11-21 ENCOUNTER — Encounter: Payer: Self-pay | Admitting: Obstetrics and Gynecology

## 2020-11-21 ENCOUNTER — Ambulatory Visit (INDEPENDENT_AMBULATORY_CARE_PROVIDER_SITE_OTHER): Payer: Medicaid Other | Admitting: Obstetrics and Gynecology

## 2020-11-21 VITALS — BP 137/74 | HR 102 | Ht 74.5 in | Wt >= 6400 oz

## 2020-11-21 DIAGNOSIS — K5900 Constipation, unspecified: Secondary | ICD-10-CM | POA: Diagnosis not present

## 2020-11-21 DIAGNOSIS — M25671 Stiffness of right ankle, not elsewhere classified: Secondary | ICD-10-CM

## 2020-11-21 DIAGNOSIS — S82891A Other fracture of right lower leg, initial encounter for closed fracture: Secondary | ICD-10-CM

## 2020-11-21 DIAGNOSIS — N393 Stress incontinence (female) (male): Secondary | ICD-10-CM | POA: Diagnosis not present

## 2020-11-21 DIAGNOSIS — R262 Difficulty in walking, not elsewhere classified: Secondary | ICD-10-CM

## 2020-11-21 DIAGNOSIS — M62838 Other muscle spasm: Secondary | ICD-10-CM | POA: Diagnosis not present

## 2020-11-21 NOTE — Progress Notes (Signed)
[facility] Return Visit  SUBJECTIVE  History of Present Illness: Venda Myrtha Mantis Kiper is a 25 y.o. female seen in follow-up for levator spasm, SUI and constipation. Plan at last visit was to start vaginal valium, pelvic physical therapy and to start a bowel regimen.  Tried the vaginal valium nightly for a few weeks, had mild improvement but not anything significant enough to improve the pain.   Constipation has improved, having bowel movements 1-2 times per day without any changes.   Continues to have occasional incontinence. Denies burning with urination.    Has appointments with physical therapy scheduled for March.   Past Medical History: Patient  has a past medical history of Abnormal uterine bleeding, Acid reflux, Alpha thalassemia trait, Amenorrhea, Anemia, Back pain, Cesarean delivery delivered (10/11/2019), Chest pain, Complication of anesthesia, Constipation, Depression, Finger mass, left (03/2017), Gestational diabetes, Gestational diabetes (05/23/2019), Gestational hypertension (09/23/2019), Headache, Hypertension, Infection, Irritable bowel syndrome (IBS), Joint pain, Morbid obesity with body mass index (BMI) of 45.0 to 49.9 in adult [facility]), Obesity during pregnancy, antepartum (04/06/2019), Plantar fasciitis, bilateral, Polycystic ovary syndrome, Prediabetes, Pregnancy induced hypertension, Shortness of breath, Sleep apnea, Supervision of normal first pregnancy (04/06/2019), and Vertigo (2017).   Past Surgical History: She  has a past surgical history that includes Esophagogastroduodenoscopy (12/21/2015); Colonoscopy with propofol (10/07/2016); Excision mass upper extremeties (Left, 04/21/2017); Cesarean section (N/A, 10/09/2019); and IR FL GUIDED LOC OF NEEDLE/CATH TIP FOR SPINAL INJECT RT (03/13/2020).   Medications: She has a current medication list which includes the following prescription(s): acetazolamide, albuterol, amlodipine, baclofen, diazepam, diclofenac,  gabapentin, hydrochlorothiazide, loratadine, oxycodone-acetaminophen, polyethylene glycol powder, topiramate, vitamin d (ergocalciferol), and ferrous sulfate.   Allergies: Patient is allergic to bee pollen, bee venom, hydrocodone-acetaminophen, peach flavor, pollen extract, and remdesivir.   Social History: Patient  reports that she has never smoked. She has never used smokeless tobacco. She reports previous alcohol use. She reports that she does not use drugs.      OBJECTIVE     Physical Exam: Vitals:   11/21/20 1502  BP: 137/74  Pulse: (!) 102  Weight: (!) 479 lb (217.3 kg)  Height: 6' 2.5" (1.892 m)   Gen: No apparent distress, A&O x 3.  Detailed Urogynecologic Evaluation:  Deferred.    ASSESSMENT AND PLAN    Ms. Newman is a 25 y.o. with:  1. Levator spasm   2. SUI (stress urinary incontinence, female)   3. Constipation, unspecified constipation type    1. Levator spasm - Discussed using pelvic floor trigger point injections to help decrease levator spasm. Will plan for weekly injections of bupivacaine/ kenalog for 6 weeks. This will then help bridge to physical therapy.   2. SUI - Will reassess after starting PT  3. Constipation, improved - Will continue to monitor symptoms.   Return for trigger point injections.   Jaquita Folds, MD   Time spent: I spent 20 minutes dedicated to the care of this patient on the date of this encounter to include pre-visit review of records, face-to-face time with the patient and post visit documentation.

## 2020-11-21 NOTE — Patient Instructions (Signed)
We will start trigger point injections. We will inject your muscles with anesthetic to help relax them. We will do weekly appointments for 6 weeks.

## 2020-11-22 NOTE — Therapy (Signed)
[place], [place], [address] Phone: [phone]   Fax:  [phone]  Physical Therapy Evaluation  Patient Details  Name: Maureen Lewis MRN: [id number] Date of Birth: Mar 04, 1996 Referring Provider (PT): Gregor Hams, MD   Encounter Date: 11/21/2020   PT End of Session - 11/22/20 2244    Visit Number 1    Number of Visits 13    Date for PT Re-Evaluation 01/11/21    Authorization Type Medicaid [employer]    Progress Note Due on Visit 10    PT Start Time 1705    PT Stop Time 1750    PT Time Calculation (min) 45 min    Activity Tolerance Patient tolerated treatment well    Behavior During Therapy [facility] for tasks assessed/performed           Past Medical History:  Diagnosis Date  . Abnormal uterine bleeding   . Acid reflux   . Alpha thalassemia trait   . Amenorrhea   . Anemia    no current med.  . Back pain   . Cesarean delivery delivered 10/11/2019   10/09/2019 - primary CS for failed IOL  . Chest pain   . Complication of anesthesia    states had to keep giving her anesthesia during EGD  . Constipation   . Depression    "I'm good"  . Finger mass, left 03/2017   middle finger  . Gestational diabetes   . Gestational diabetes 05/23/2019   Current Diabetic Medications:  None  [ ]  Aspirin 81 mg daily after 12 weeks (? A2/B GDM)  Required Referrals for A1GDM or A2GDM: [ ]  Diabetes Education and Testing Supplies [ ]  Nutrition Cousult  For A2/B GDM or higher classes of DM [ ]  Diabetes Education and Testing Supplies [ ]  Nutrition Counsult [ ]  Fetal ECHO after 20 weeks  [ ]  Eye exam for retina evaluation   Baseline and surveillance labs (  . Gestational hypertension 09/23/2019   Guidelines for Antenatal Testing and Sonography  (with updated ICD-10 codes)  Updated  09-12-2019 with Dr. Tama High  INDICATION U/S 2 X week NST/AFI  or full BPP wkly DELIVERY Diabetes   A1 - good control - O24.410     A2 - good control - O24.419      A2  - poor control or poor compliance - O24.419, E11.65   (Macrosomia or polyhydramnios) **E11.65 is extra code for poor control**    A2/B - O24.  Marland Kitchen Headache   . Hypertension   . Infection    UTI  . Irritable bowel syndrome (IBS)   . Joint pain   . Morbid obesity with body mass index (BMI) of 45.0 to 49.9 in adult [facility])   . Obesity during pregnancy, antepartum 04/06/2019   Body mass index is 53.71 kg/m.  Recommendations [x]  Aspirin 81 mg daily after 12 weeks; discontinue after 36 weeks [ ]  Nutrition consult [ ]  Weight gain 11-20 lbs for singleton and 25-35 lbs for twin pregnancy (IOM guidelines) Higher class of obesity patients recommended to gain closer to lower limit  Weight loss is associated with adverse outcomes [ ]  Baseline and surveillance labs (pulled in fr  . Plantar fasciitis, bilateral   . Polycystic ovary syndrome   . Prediabetes    "prediabetes"  . Pregnancy induced hypertension   . Shortness of breath   . Sleep apnea    no CPAP use  . Supervision of normal  first pregnancy 04/06/2019   [name]: [ x]Initial [ x]12 [ ] 20 [ ] 28 [ ] 32 [ ] 36 [ ] 38 [ ] 39 [ ] 40 Nursing Staff Provider Office Location [facility]  Dating   LMP Language  English  Anatomy US  Nml Flu Vaccine  Declined 07/28/19 Genetic Screen  NIPS: low risks   AFP:   negative  TDaP vaccine   08/25/19 Hgb A1C or  GTT Early A1C 5.8 Third trimester: GDM insulin Rhogam  NA   LAB RESULTS  Feeding Plan Breast Blood Type  . Vertigo 2017    Past Surgical History:  Procedure Laterality Date  . CESAREAN SECTION N/A 10/09/2019   Procedure: CESAREAN SECTION;  Surgeon: Aletha Halim, MD;  Location: [facility] LD [facility];  Service: Obstetrics;  Laterality: N/A;  . COLONOSCOPY WITH PROPOFOL  10/07/2016  . ESOPHAGOGASTRODUODENOSCOPY  12/21/2015  . EXCISION MASS UPPER EXTREMETIES Left 04/21/2017   Procedure: EXCISION MASS LEFT MIDDLE FINGER;  Surgeon: Daryll Brod, MD;  Location: [facility];   Service: Orthopedics;  Laterality: Left;  . IR FL GUIDED LOC OF NEEDLE/CATH TIP FOR SPINAL INJECTION RT  03/13/2020    There were no vitals filed for this visit.    Subjective Assessment - 11/21/20 1725    Subjective My ankle is getting better. Doing the home exs daily. The pain can get bad and that limits me. Pt states she is trying to wean herself off the ASO brace and Cam boot.    Pertinent History acid reflux, alpha thalassemia trait, anemia, back pain, depression, gestational diabetes, HA, HTN, UTI, IBS, obesity, bilat plantar fasciitis, OSA, vertigo    Limitations Standing;Walking;House hold activities    How long can you sit comfortably? unlimited    How long can you stand comfortably? 5 mins    How long can you walk comfortably? 2 mins    Diagnostic tests 11/19: IMPRESSION:  1. Subacute nondisplaced fracture of the posterior malleolus of the  distal tibia with mild surrounding marrow edema.    Patient Stated Goals To reduce pain and to return to be able to stand for 3 hours    Currently in Pain? Yes    Pain Score 5    9/10 c prolonged standing   Pain Location Ankle    Pain Orientation Right    Pain Descriptors / Indicators Aching;Stabbing    Pain Type Chronic pain    Pain Onset More than a month ago    Pain Frequency Intermittent    Aggravating Factors  standing and walking    Pain Relieving Factors Ice, rest, elevate, medications    Effect of Pain on Daily Activities Limits time on her feet              [facility] PT Assessment - 11/22/20 0001      Assessment   Medical Diagnosis Closed fracture of right ankle, initial encounter    Referring Provider (PT) Gregor Hams, MD    Onset Date/Surgical Date 07/10/20    Hand Dominance Right    Next MD Visit 12/07/20    Prior Therapy yes      Precautions   Precautions None      Restrictions   Weight Bearing Restrictions No      Balance Screen   Has the patient fallen in the past 6 months Yes    How many times? 1   fx R  ankle   Is the patient reluctant to leave their home because of a fear of falling?  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      Cognition   Overall Cognitive Status Within Functional Limits for tasks assessed      Observation/Other Assessments   Focus on Therapeutic Outcomes (FOTO)  NA - MCD      Sensation   Light Touch Appears Intact      Posture/Postural Control   Posture Comments Marked valgus deformity bilat knees and varus deformity bilat ankles      ROM / Strength   AROM / PROM / Strength AROM;PROM;Strength      AROM   AROM Assessment Site Ankle    Right/Left Shoulder Right;Left    Right/Left Ankle Right;Left    Right Ankle Dorsiflexion -5    Right Ankle Inversion 28    Right Ankle Eversion 16    Left Ankle Dorsiflexion 5    Left Ankle Inversion 28    Left Ankle Eversion 25      PROM   Right/Left Ankle Right;Left    Right Ankle Dorsiflexion 3    Left Ankle Dorsiflexion 10      Strength   Overall Strength Comments R ankle strength equals 5/5 for DF, PF, Inv, Ev      Palpation   Palpation comment TTP along the posterior/distal aspects for the medial and lateral malleoli      Transfers   Transfers Sit to Stand;Stand to Sit    Sit to Stand 7: Independent      Ambulation/Gait   Ambulation/Gait Yes    Ambulation/Gait Assistance 7: Independent    Gait Pattern Step-through pattern   Marked knee valgus bilat without thigh separation and ankle varus bilat. Pt walks c a heel toe gait pattern.                     Objective measurements completed on examination: See above findings.                PT Education - 11/22/20 2241    Education Details Eval findings, POC, HEP, cross friction massage to the peroneal and posterior tibialis tendons along the posterior/distal aspects of the lateral and medial malleoli 2 to 3x daily    Person(s) Educated Patient    Methods Explanation;Demonstration;Tactile cues;Verbal cues;Handout    Comprehension Verbalized understanding;Returned demonstration;Verbal cues required;Tactile cues required;Need further instruction            PT Short Term Goals - 11/22/20 2305      PT SHORT TERM GOAL #1   Title pt to be I with inital HEP    Baseline Started on eval    Status New    Target Date 12/13/20      PT SHORT TERM GOAL #2   Title Pt will demonstate proper technique for cross friction masage    Status New    Target Date 12/13/20             PT Long Term Goals - 11/22/20 2309      PT LONG TERM GOAL #1   Title Pt will be Ind  in a final HEP to maintain or progress achieved level of function    Status New    Target Date 01/04/21      PT LONG TERM GOAL #2   Title Increased R ankle DF to 5d for improved functional mobility    Baseline R ankle DF -5d    Status New    Target Date 01/11/21      PT LONG TERM GOAL #3   Title Pt will rport improved r ankle pain with daily activities to 3/10 or less.    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    Baseline 5 mins; 9/10    Status New    Target Date 01/11/21                  Plan - 11/22/20 2246    Clinical Impression Statement Pt presents to PT 15 weeks following a fracture of the R posterior mallious during a fall. R ankle DF was found to be decreased. With obesity and marked knee valgus and ankle varus, both ankles are experiencing significant abnormal  forces. TTP of the peroneal and post. tibialis tendons suggests possible tendinopathy. Pt will benefit from PT 2w6 for ROM, stength, manual tecniques and modalities to decrease pain and optimize functional use of the R ankle.    Personal Factors and Comorbidities Age;Comorbidity 3+;Fitness;Profession;Time since onset of injury/illness/exacerbation    Comorbidities alpha thalassemia trait, anemia, back pain, depression, gestational diabetes, obesity, bilat plantar fasciitis, OSA, vertigo    Examination-Activity Limitations Stand;Locomotion Level    Examination-Participation Restrictions Occupation    Stability/Clinical Decision Making Evolving/Moderate complexity    Clinical Decision Making Moderate    Rehab Potential Fair    PT Frequency 2x / week    PT Duration 6 weeks    PT Treatment/Interventions Electrical Stimulation;Ultrasound;Moist Heat;Iontophoresis 4mg /ml Dexamethasone;Gait training;Stair training;Functional mobility training;Therapeutic activities;Therapeutic exercise;Balance training;Manual techniques;Patient/family education;Passive range of motion;Dry needling;Taping    PT Next Visit Plan Assess respnse to standing DF stretch and cross friction massage of the peroneal and post. tibialis tendons. [place] has Tband strengthening exs from previous therapy    PT Home Exercise Plan [id number]    Consulted and Agree with Plan of Care Patient           Patient will benefit from skilled therapeutic intervention in order to improve the following deficits and impairments:  Difficulty walking,Abnormal gait,Decreased range of motion,Obesity,Pain,Postural dysfunction  Visit Diagnosis: Closed fracture of right ankle, initial encounter  Decreased range of motion of right ankle  Difficulty in walking, not elsewhere classified     Problem List Patient Active Problem List   Diagnosis Date Noted  . Levator spasm 11/21/2020  . Galactorrhea 11/19/2020  . Closed fracture of right ankle  09/18/2020  . Sprain of ankle 07/18/2020  . Acute hypoxemic respiratory failure due to COVID-19 ([place]) 06/11/2020  . Hx of migraine headaches 04/09/2020  . Postpartum care following cesarean delivery 11/22/2019  . Contraceptive management 11/22/2019  . Positive RPR test 10/09/2019  . HTN (hypertension) 09/23/2019  . Migraine headache 07/12/2019  . Alpha thalassemia trait   . Vitamin D deficiency 09/16/2017  . Prediabetes 09/16/2017  . Depression 09/16/2017  . Class 3 severe obesity with  serious comorbidity and body mass index (BMI) of 50.0 to 59.9 in adult [facility]) 09/16/2017  . PCOS (polycystic ovarian syndrome) 08/13/2017  . Mass 05/06/2017  . Vertigo 10/06/2016  . Microcytic anemia 09/26/2016  . Pseudotumor cerebri syndrome 08/28/2016  . Obesity hypoventilation syndrome ([place]) 08/28/2016  . Benign paroxysmal positional vertigo due to bilateral vestibular disorder 08/28/2016  . OSA (obstructive sleep apnea) 08/28/2016  . Hypersomnia with sleep apnea 08/28/2016  . Female hirsutism 08/28/2016  . GERD (gastroesophageal reflux disease) 02/24/2015  . Constipation 10/02/2014    Gar Ponto MS, PT 11/22/20 11:24 PM  [facility] [address] [place], [place], [address] Phone: [phone]   Fax:  [phone]  Name: Maureen Lewis MRN: [id number] Date of Birth: 1996/09/20   Check all possible CPT codes: [id number]- Therapeutic Exercise, [phone] - Gait Training, [phone] - Manual Therapy, [id number] - Therapeutic Activities, [id number] - [place], [id number] - Electrical stimulation (unattended), [id number] - Electrical stimulation (Manual), [id number] - Iontophoresis, [id number] - Ultrasound and [id number] - Vaso

## 2020-11-27 ENCOUNTER — Other Ambulatory Visit: Payer: Self-pay | Admitting: Ophthalmology

## 2020-11-29 ENCOUNTER — Other Ambulatory Visit: Payer: Self-pay | Admitting: Ophthalmology

## 2020-11-30 ENCOUNTER — Ambulatory Visit
Admission: RE | Admit: 2020-11-30 | Discharge: 2020-11-30 | Disposition: A | Discharge status: Home / Self Care | Payer: Medicaid Other | Source: Ambulatory Visit | Attending: Obstetrics and Gynecology | Admitting: Obstetrics and Gynecology

## 2020-11-30 ENCOUNTER — Other Ambulatory Visit: Payer: Self-pay

## 2020-11-30 DIAGNOSIS — N6489 Other specified disorders of breast: Secondary | ICD-10-CM | POA: Diagnosis not present

## 2020-11-30 DIAGNOSIS — N643 Galactorrhea not associated with childbirth: Secondary | ICD-10-CM

## 2020-12-10 ENCOUNTER — Telehealth: Payer: Self-pay | Admitting: Adult Health

## 2020-12-10 ENCOUNTER — Ambulatory Visit: Payer: BC Managed Care – PPO | Admitting: Obstetrics and Gynecology

## 2020-12-10 NOTE — Telephone Encounter (Signed)
Patient is calling and is requesting a call back from the nurse [facility] regarding a personal matter, please advise. CB is [phone]

## 2020-12-11 ENCOUNTER — Other Ambulatory Visit (HOSPITAL_COMMUNITY)
Admission: RE | Admit: 2020-12-11 | Discharge: 2020-12-11 | Disposition: A | Discharge status: Home / Self Care | Payer: Medicaid Other | Source: Ambulatory Visit | Attending: Ophthalmology | Admitting: Ophthalmology

## 2020-12-11 ENCOUNTER — Ambulatory Visit (INDEPENDENT_AMBULATORY_CARE_PROVIDER_SITE_OTHER): Payer: Medicaid Other

## 2020-12-11 ENCOUNTER — Ambulatory Visit: Payer: Medicaid Other | Admitting: Adult Health

## 2020-12-11 ENCOUNTER — Encounter: Payer: Self-pay | Admitting: Adult Health

## 2020-12-11 ENCOUNTER — Other Ambulatory Visit: Payer: Self-pay

## 2020-12-11 ENCOUNTER — Encounter: Payer: Self-pay | Admitting: Physical Therapy

## 2020-12-11 ENCOUNTER — Ambulatory Visit: Payer: Medicaid Other | Admitting: Physical Therapy

## 2020-12-11 VITALS — BP 120/74 | Temp 98.2°F | Wt >= 6400 oz

## 2020-12-11 DIAGNOSIS — R262 Difficulty in walking, not elsewhere classified: Secondary | ICD-10-CM

## 2020-12-11 DIAGNOSIS — S82891A Other fracture of right lower leg, initial encounter for closed fracture: Secondary | ICD-10-CM | POA: Diagnosis not present

## 2020-12-11 DIAGNOSIS — R079 Chest pain, unspecified: Secondary | ICD-10-CM

## 2020-12-11 DIAGNOSIS — M25671 Stiffness of right ankle, not elsewhere classified: Secondary | ICD-10-CM

## 2020-12-11 DIAGNOSIS — Z20822 Contact with and (suspected) exposure to covid-19: Secondary | ICD-10-CM | POA: Diagnosis not present

## 2020-12-11 DIAGNOSIS — Z01812 Encounter for preprocedural laboratory examination: Secondary | ICD-10-CM | POA: Diagnosis not present

## 2020-12-11 DIAGNOSIS — R0602 Shortness of breath: Secondary | ICD-10-CM | POA: Diagnosis not present

## 2020-12-11 LAB — SARS CORONAVIRUS 2 (TAT 6-24 HRS): SARS Coronavirus 2: NEGATIVE

## 2020-12-11 NOTE — Therapy (Addendum)
[facility] [place], [place], [address] Phone: [phone]   Fax:  [phone]  Physical Therapy Treatment / Discharge  Patient Details  Name: Maureen Lewis MRN: [id number] Date of Birth: 1996-09-18 Referring Provider (PT): Gregor Hams, MD   Encounter Date: 12/11/2020   PT End of Session - 12/11/20 1634    Visit Number 2    Number of Visits 13    Date for PT Re-Evaluation 01/11/21    Authorization Type Medicaid [employer]    Authorization Time Period requesting 6 visits, 1x/week x 6  (has used eval + 10 visits at [facility])    PT Start Time [phone]    PT Stop Time 1711    PT Time Calculation (min) 40 min    Activity Tolerance Patient tolerated treatment well           Past Medical History:  Diagnosis Date  . Abnormal uterine bleeding   . Acid reflux   . Alpha thalassemia trait   . Amenorrhea   . Anemia    no current med.  . Back pain   . Cesarean delivery delivered 10/11/2019   10/09/2019 - primary CS for failed IOL  . Chest pain   . Complication of anesthesia    states had to keep giving her anesthesia during EGD  . Constipation   . Depression    "I'm good"  . Finger mass, left 03/2017   middle finger  . Gestational diabetes   . Gestational diabetes 05/23/2019   Current Diabetic Medications:  None  _0  Aspirin 81 mg daily after 12 weeks (? A2/B GDM)  Required Referrals for A1GDM or A2GDM: _1  Diabetes Education and Testing Supplies _2  Nutrition Cousult  For A2/B GDM or higher classes of DM _3  Diabetes Education and Testing Supplies _4  Nutrition Counsult _5  Fetal ECHO after 20 weeks  _6  Eye exam for retina evaluation   Baseline and surveillance labs (  . Gestational hypertension 09/23/2019   Guidelines for Antenatal Testing and Sonography  (with updated ICD-10 codes)  Updated  September 26, 2019 with Dr. Tama High  INDICATION U/S 2 X week NST/AFI  or full BPP wkly DELIVERY Diabetes   A1 -  good control - O24.410    A2 - good control - O24.419      A2  - poor control or poor compliance - O24.419, E11.65   (Macrosomia or polyhydramnios) **E11.65 is extra code for poor control**    A2/B - O24.  Marland Kitchen Headache   . Hypertension   . Infection    UTI  . Irritable bowel syndrome (IBS)   . Joint pain   . Morbid obesity with body mass index (BMI) of 45.0 to 49.9 in adult [facility])   . Obesity during pregnancy, antepartum 04/06/2019   Body mass index is 53.71 kg/m.  Recommendations _7  Aspirin 81 mg daily after 12 weeks; discontinue after 36 weeks _8  Nutrition consult _9  Weight gain 11-20 lbs for singleton and 25-35 lbs for twin pregnancy (IOM guidelines) Higher class of obesity patients recommended to gain closer to lower limit  Weight loss is associated with adverse outcomes _10  Baseline and surveillance labs (pulled in fr  . Plantar fasciitis, bilateral   . Polycystic ovary syndrome   . Prediabetes    "prediabetes"  . Pregnancy induced hypertension   . Shortness of breath   . Sleep apnea    no CPAP use  .  Supervision of normal first pregnancy 04/06/2019   [name]: [ x]Initial [ x]12 $Remo'[ ]'hKexP$ 20 $Remo'[ ]'MZKJK$ 28 $Remo'[ ]'TFVMT$ 32 $Remo'[ ]'eMJMy$ 36 $Remo'[ ]'HQDXn$ 38 $Remo'[ ]'ahRgW$ 39 $Remo'[ ]'ahCMP$ 40 Nursing Staff Provider Office Location [facility]  Dating   LMP Language  English  Anatomy US  Nml Flu Vaccine  Declined 07/28/19 Genetic Screen  NIPS: low risks   AFP:   negative  TDaP vaccine   08/25/19 Hgb A1C or  GTT Early A1C 5.8 Third trimester: GDM insulin Rhogam  NA   LAB RESULTS  Feeding Plan Breast Blood Type  . Vertigo 2017    Past Surgical History:  Procedure Laterality Date  . CESAREAN SECTION N/A 10/09/2019   Procedure: CESAREAN SECTION;  Surgeon: Aletha Halim, MD;  [facility]: [facility];  Service: Obstetrics;  Laterality: N/A;  . COLONOSCOPY WITH PROPOFOL  10/07/2016  . ESOPHAGOGASTRODUODENOSCOPY  12/21/2015  . EXCISION MASS UPPER EXTREMETIES Left 04/21/2017   Procedure: EXCISION MASS LEFT MIDDLE FINGER;  Surgeon: Daryll Brod, MD;  Location:  [place];  Service: Orthopedics;  Laterality: Left;  . IR FL GUIDED LOC OF NEEDLE/CATH TIP FOR SPINAL INJECTION RT  03/13/2020    There were no vitals filed for this visit.                      [facility] Adult PT Treatment/Exercise - 12/11/20 0001      Manual Therapy   Manual therapy comments MTPR along the posterior tib and peroneal    Joint Mobilization talocrural PA mobs grade IV, distraction mobs grade IV      Ankle Exercises: Standing   Heel Raises Both;15 reps    Other Standing Ankle Exercises marching focusing on controlled lowering 2 x 10      Ankle Exercises: Seated   [place] Pickup x 3    BAPS Level 3;Sitting   20 reps, DF/PF, Inv/ eversion and CW/CCW   Other Seated Ankle Exercises R ankle posterior tib strengthening with gr                    PT Short Term Goals - 11/22/20 2305      PT SHORT TERM GOAL #1   Title pt to be I with inital HEP    Baseline Started on eval    Status New    Target Date 12/13/20      PT SHORT TERM GOAL #2   Title Pt will demonstate proper technique for cross friction masage    Status New    Target Date 12/13/20             PT Long Term Goals - 11/22/20 2309      PT LONG TERM GOAL #1   Title Pt will be Ind in a final HEP to maintain or progress achieved level of function    Status New    Target Date 01/04/21      PT LONG TERM GOAL #2   Title Increased R ankle DF to 5d for improved functional mobility    Baseline R ankle DF -5d    Status New    Target Date 01/11/21      PT LONG TERM GOAL #3   Title Pt will rport improved r ankle pain with daily activities to 3/10 or less.    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    Baseline 5 mins; 9/10    Status New    Target Date 01/11/21                  Plan - 12/11/20 1711    Clinical Impression Statement pt reports consistency with her HEP and reports mostly pain in the medial/ lateral aspect of her ankle. She responded well to [facility] along the posterior tib and peroneals. Focused strengthening on posterior tib strengthening and intrinsics which she did well with. progressed to WB without her ASO on and practiced marching and heel raises. end of session she reported soreness but overall no increase in pain.    PT Treatment/Interventions Electrical Stimulation;Ultrasound;Moist Heat;Iontophoresis 4mg /ml Dexamethasone;Gait training;Stair training;Functional mobility training;Therapeutic activities;Therapeutic exercise;Balance training;Manual techniques;Patient/family education;Passive range of motion;Dry needling;Taping    PT Next Visit Plan Assess respnse to standing DF stretch and cross friction massage of the peroneal and post. tibialis tendons. [place] has Tband strengthening exs from previous therapy    PT Home Exercise Plan [id number]    Consulted and Agree with Plan of Care Patient           Patient will benefit from skilled therapeutic intervention in order to improve the following deficits and impairments:  Difficulty walking,Abnormal gait,Decreased range of motion,Obesity,Pain,Postural dysfunction  Visit Diagnosis: Closed fracture of right ankle, initial encounter  Decreased range of motion of right ankle  Difficulty in walking, not elsewhere classified     Problem List Patient Active Problem List   Diagnosis Date Noted  . Levator spasm 11/21/2020  . Galactorrhea 11/19/2020  . Closed fracture of right ankle 09/18/2020  . Sprain of ankle 07/18/2020  . Acute hypoxemic respiratory failure due to COVID-19 ([place]) 06/11/2020  . Hx of migraine headaches 04/09/2020  . Postpartum care following cesarean delivery 11/22/2019  . Contraceptive management 11/22/2019  . Positive RPR test 10/09/2019  . HTN  (hypertension) 09/23/2019  . Migraine headache 07/12/2019  . Alpha thalassemia trait   . Vitamin D deficiency 09/16/2017  . Prediabetes 09/16/2017  . Depression 09/16/2017  . Class 3 severe obesity with serious comorbidity and body mass index (BMI) of 50.0 to 59.9 in adult ([place]) 09/16/2017  . PCOS (polycystic ovarian syndrome) 08/13/2017  . Mass 05/06/2017  . Vertigo 10/06/2016  . Microcytic anemia 09/26/2016  . Pseudotumor cerebri syndrome 08/28/2016  . Obesity hypoventilation syndrome ([place]) 08/28/2016  . Benign paroxysmal positional vertigo due to bilateral vestibular disorder 08/28/2016  . OSA (obstructive sleep apnea) 08/28/2016  . Hypersomnia with sleep apnea 08/28/2016  . Female hirsutism 08/28/2016  . GERD (gastroesophageal reflux disease) 02/24/2015  . Constipation 10/02/2014   Starr Lake PT, DPT, LAT, ATC  12/11/20  5:23 PM      [facility] [address] [place], [place], [address] Phone: [phone]   Fax:  [phone]  Name: Maureen Lewis MRN: [id number] Date of Birth: [birth year]      PHYSICAL THERAPY DISCHARGE SUMMARY  Visits from Start of Care: 2  Current functional level related to goals / functional outcomes: See goals   Remaining deficits: Current status unknown   Education / Equipment: HEP  Plan: Patient agrees to discharge.  Patient goals were not met. Patient is being discharged due to not returning since the last visit.  ?????         Tanessa Tidd PT,  DPT, LAT, ATC  01/24/21  1:24 PM

## 2020-12-11 NOTE — Progress Notes (Signed)
Subjective:    Patient ID: Maureen Lewis, female    DOB: [birth year], 25 y.o.   MRN: [id number]  HPI 25 year old female who  has a past medical history of Abnormal uterine bleeding, Acid reflux, Alpha thalassemia trait, Amenorrhea, Anemia, Back pain, Cesarean delivery delivered (10/11/2019), Chest pain, Complication of anesthesia, Constipation, Depression, Finger mass, left (03/2017), Gestational diabetes, Gestational diabetes (05/23/2019), Gestational hypertension (09/23/2019), Headache, Hypertension, Infection, Irritable bowel syndrome (IBS), Joint pain, Morbid obesity with body mass index (BMI) of 45.0 to 49.9 in adult [facility]), Obesity during pregnancy, antepartum (04/06/2019), Plantar fasciitis, bilateral, Polycystic ovary syndrome, Prediabetes, Pregnancy induced hypertension, Shortness of breath, Sleep apnea, Supervision of normal first pregnancy (04/06/2019), and Vertigo (2017).  She presents to the office today for recurrent left sided chest pain and SOB. She reports that symptoms are similar to when she had pneumonia and she would like to be checked out to make sure that she does not have a recurrence. Reports that her symptoms started days ago with midsternal chest pain/tightness that radiates to her right upper back. Pain is worse with breathing. Does endorse mild shortness of breath and wheezing as well. When she uses her inhaler it helps with the symptoms. She denies trauma or aggravating injury. Has not had a fever, chills, or productive cough.   Review of Systems See HPI   Past Medical History:  Diagnosis Date  . Abnormal uterine bleeding   . Acid reflux   . Alpha thalassemia trait   . Amenorrhea   . Anemia    no current med.  . Back pain   . Cesarean delivery delivered 10/11/2019   10/09/2019 - primary CS for failed IOL  . Chest pain   . Complication of anesthesia    states had to keep giving her anesthesia during EGD  . Constipation   . Depression    "I'm good"  .  Finger mass, left 03/2017   middle finger  . Gestational diabetes   . Gestational diabetes 05/23/2019   Current Diabetic Medications:  None  [ ]  Aspirin 81 mg daily after 12 weeks (? A2/B GDM)  Required Referrals for A1GDM or A2GDM: [ ]  Diabetes Education and Testing Supplies [ ]  Nutrition Cousult  For A2/B GDM or higher classes of DM [ ]  Diabetes Education and Testing Supplies [ ]  Nutrition Counsult [ ]  Fetal ECHO after 20 weeks  [ ]  Eye exam for retina evaluation   Baseline and surveillance labs (  . Gestational hypertension 09/23/2019   Guidelines for Antenatal Testing and Sonography  (with updated ICD-10 codes)  Updated  09-19-19 with Dr. Tama High  INDICATION U/S 2 X week NST/AFI  or full BPP wkly DELIVERY Diabetes   A1 - good control - O24.410    A2 - good control - O24.419      A2  - poor control or poor compliance - O24.419, E11.65   (Macrosomia or polyhydramnios) **E11.65 is extra code for poor control**    A2/B - O24.  Marland Kitchen Headache   . Hypertension   . Infection    UTI  . Irritable bowel syndrome (IBS)   . Joint pain   . Morbid obesity with body mass index (BMI) of 45.0 to 49.9 in adult [facility])   . Obesity during pregnancy, antepartum 04/06/2019   Body mass index is 53.71 kg/m.  Recommendations [x]  Aspirin 81 mg daily after 12 weeks; discontinue after 36 weeks [ ]  Nutrition consult [ ]  Weight gain 11-20 lbs  for singleton and 25-35 lbs for twin pregnancy (IOM guidelines) Higher class of obesity patients recommended to gain closer to lower limit  Weight loss is associated with adverse outcomes [ ]  Baseline and surveillance labs (pulled in fr  . Plantar fasciitis, bilateral   . Polycystic ovary syndrome   . Prediabetes    "prediabetes"  . Pregnancy induced hypertension   . Shortness of breath   . Sleep apnea    no CPAP use  . Supervision of normal first pregnancy 04/06/2019   [name]: [ x]Initial [ x]12 [ ] 20 [ ] 28 [ ] 32 [ ] 36 [ ] 38 [ ] 39 [ ] 40 Nursing Staff Provider Office  Location [facility]  Dating   LMP Language  English  Anatomy US  Nml Flu Vaccine  Declined 07/28/19 Genetic Screen  NIPS: low risks   AFP:   negative  TDaP vaccine   08/25/19 Hgb A1C or  GTT Early A1C 5.8 Third trimester: GDM insulin Rhogam  NA   LAB RESULTS  Feeding Plan Breast Blood Type  . Vertigo 2017    Social History   Socioeconomic History  . Marital status: Single    Spouse name: Not on file  . Number of children: Not on file  . Years of education: Not on file  . Highest education level: Not on file  Occupational History  . Occupation: Chemical engineer: [id number]  Tobacco Use  . Smoking status: Never Smoker  . Smokeless tobacco: Never Used  Vaping Use  . Vaping Use: Never used  Substance and Sexual Activity  . Alcohol use: Not Currently    Comment: social   . Drug use: No  . Sexual activity: Yes    Birth control/protection: None, Condom  Other Topics Concern  . Not on file  Social History Narrative   Right handed    Soda sometimes   Lives with mom and cousin, a grandmother in [place] also helps care for her.       She is in nursing school.    Social Determinants of Health   Financial Resource Strain: Not on file  Food Insecurity: Not on file  Transportation Needs: Not on file  Physical Activity: Not on file  Stress: Not on file  Social Connections: Not on file  Intimate Partner Violence: Not on file    Past Surgical History:  Procedure Laterality Date  . CESAREAN SECTION N/A 10/09/2019   Procedure: CESAREAN SECTION;  Surgeon: Aletha Halim, MD;  [facility];  Service: Obstetrics;  Laterality: N/A;  . COLONOSCOPY WITH PROPOFOL  10/07/2016  . ESOPHAGOGASTRODUODENOSCOPY  12/21/2015  . EXCISION MASS UPPER EXTREMETIES Left 04/21/2017   Procedure: EXCISION MASS LEFT MIDDLE FINGER;  Surgeon: Daryll Brod, MD;  Location: [place];  Service: Orthopedics;  Laterality: Left;  . IR FL GUIDED LOC OF NEEDLE/CATH TIP FOR SPINAL  INJECTION RT  03/13/2020    Family History  Problem Relation Age of Onset  . Diabetes Maternal Aunt   . Hypertension Maternal Uncle   . Depression Maternal Grandfather   . Diabetes Maternal Grandfather   . Hypertension Maternal Grandfather   . Arthritis Mother   . High blood pressure Mother   . Depression Mother   . Sleep apnea Mother   . Obesity Mother   . Diabetes Mother   . Hypertension Mother   . Kidney failure Mother   . Sleep apnea Father   . Obesity Father   . Healthy Daughter  Allergies  Allergen Reactions  . Bee Pollen Hives, Shortness Of Breath and Swelling  . Bee Venom Hives, Shortness Of Breath and Swelling  . Hydrocodone-Acetaminophen Anaphylaxis    No problem when she takes Tylenol  . Peach Flavor Hives, Shortness Of Breath and Other (See Comments)    SWELLING OF MOUTH  . Pollen Extract Hives, Shortness Of Breath and Swelling  . Remdesivir Swelling    Angioedema 8/25    Current Outpatient Medications on File Prior to Visit  Medication Sig Dispense Refill  . acetaZOLAMIDE (DIAMOX) 500 MG capsule Take 1 capsule (500 mg total) by mouth 2 (two) times daily. For headache 60 capsule 2  . albuterol (VENTOLIN HFA) 108 (90 Base) MCG/ACT inhaler Inhale 2 puffs into the lungs every 6 (six) hours as needed for wheezing or shortness of breath. 8 g 0  . amLODipine (NORVASC) 10 MG tablet Take 1 tablet (10 mg total) by mouth daily. 90 tablet 3  . baclofen (LIORESAL) 10 MG tablet Take 1 tablet (10 mg total) by mouth 2 (two) times daily. 30 each 0  . diclofenac (VOLTAREN) 75 MG EC tablet Take 1 tablet (75 mg total) by mouth 2 (two) times daily. 60 tablet 1  . gabapentin (NEURONTIN) 100 MG capsule Take 100 mg by mouth at bedtime.    . hydrochlorothiazide (HYDRODIURIL) 25 MG tablet Take 1 tablet (25 mg total) by mouth daily. 90 tablet 3  . ibuprofen (ADVIL) 800 MG tablet Take 800 mg by mouth every 8 (eight) hours as needed.    . loratadine (CLARITIN) 10 MG tablet Take 10 mg  by mouth daily as needed for allergies.     Marland Kitchen ofloxacin (OCUFLOX) 0.3 % ophthalmic solution Place 1 drop into the right eye in the morning, at noon, in the evening, and at bedtime.    Marland Kitchen oxyCODONE-acetaminophen (PERCOCET/ROXICET) 5-325 MG tablet Take 1-2 tablets by mouth every 6 (six) hours as needed for severe pain. (Patient taking differently: Take 1 tablet by mouth 5 (five) times daily.) 20 tablet 0  . polyethylene glycol powder (GLYCOLAX/MIRALAX) 17 GM/SCOOP powder Take 17 g by mouth daily as needed for moderate constipation.     . prednisoLONE acetate (PRED FORTE) 1 % ophthalmic suspension Place 1 drop into the right eye in the morning, at noon, in the evening, and at bedtime.    . topiramate (TOPAMAX) 25 MG tablet Take 2 tablets (50 mg total) by mouth 2 (two) times daily. 180 tablet 1  . Vitamin D, Ergocalciferol, (DRISDOL) 1.25 MG (50000 UNIT) CAPS capsule Take 1 capsule (50,000 Units total) by mouth every 7 (seven) days. (Patient taking differently: Take 50,000 Units by mouth every Monday.) 4 capsule 0  . ferrous sulfate 325 (65 FE) MG tablet Take 1 tablet (325 mg total) by mouth every other day. (Patient taking differently: Take 325 mg by mouth every Monday, Wednesday, and Friday.) 45 tablet 1   No current facility-administered medications on file prior to visit.    BP 120/74   Temp 98.2 F (36.8 C)   Wt (!) 478 lb (216.8 kg)   BMI 60.55 kg/m       Objective:   Physical Exam Vitals and nursing note reviewed.  Constitutional:      Appearance: Normal appearance.  Cardiovascular:     Rate and Rhythm: Normal rate and regular rhythm.     Pulses: Normal pulses.     Heart sounds: Normal heart sounds.  Pulmonary:     Effort: Pulmonary effort is normal.  Breath sounds: Rhonchi present. No wheezing or rales.  Chest:     Chest wall: Tenderness present.  Musculoskeletal:        General: Tenderness present.     Comments: In earnest with palpation to sternum, pectoral muscle, and  trapezius on left side  Skin:    General: Skin is warm and dry.  Neurological:     General: No focal deficit present.     Mental Status: She is alert and oriented to person, place, and time.  Psychiatric:        Mood and Affect: Mood normal.        Behavior: Behavior normal.        Thought Content: Thought content normal.        Judgment: Judgment normal.       Assessment & Plan:  1. Chest pain, unspecified type -No Concern for cardiac issue. Seems to be more muscular in nature, possibly from picking up or carrying her young child? We will repeat chest x-ray to rule out pneumonia. - DG Chest 2 View; Future   Dorothyann Peng, NP

## 2020-12-11 NOTE — Telephone Encounter (Signed)
Spoke to the pt.  She c/o chest discomfort like before.  Wanted to see [facility].  Scheduled her for appt today at 10:30.  Nothing further needed.

## 2020-12-12 ENCOUNTER — Encounter (HOSPITAL_COMMUNITY): Payer: Self-pay | Admitting: Ophthalmology

## 2020-12-12 ENCOUNTER — Other Ambulatory Visit: Payer: Self-pay

## 2020-12-12 NOTE — Anesthesia Preprocedure Evaluation (Addendum)
Anesthesia Evaluation  Patient identified by MRN, date of birth, ID band Patient awake    Reviewed: Allergy & Precautions, NPO status , Patient's Chart, lab work & pertinent test results  Airway Mallampati: IV  TM Distance: >3 FB Neck ROM: Full    Dental  (+) Chipped, Poor Dentition, Dental Advisory Given,    Pulmonary shortness of breath, at rest and lying, sleep apnea ,    Pulmonary exam normal breath sounds clear to auscultation       Cardiovascular hypertension, Pt. on medications Normal cardiovascular exam Rhythm:Regular Rate:Normal  ECHO: 1. Left ventricular ejection fraction, by estimation, is 50 to 55%. The left ventricle has low normal function. The left ventricle has no regional wall motion abnormalities. The left ventricular internal cavity size was mildly dilated. Left ventricular diastolic parameters are indeterminate. 2. Right ventricular systolic function is normal. The right ventricular size is normal. 3. The mitral valve is normal in structure. No evidence of mitral valve regurgitation. No evidence of mitral stenosis. 4. The aortic valve is tricuspid. Aortic valve regurgitation is not visualized. No aortic stenosis is present. 5. The inferior vena cava is normal in size with greater than 50% respiratory variability, suggesting right atrial pressure of 3 mmHg.   Neuro/Psych  Headaches, PSYCHIATRIC DISORDERS Depression  Neuromuscular disease    GI/Hepatic negative GI ROS, Neg liver ROS,   Endo/Other  diabetesMorbid obesity (SUPER)Polycystic ovary syndrome  Renal/GU negative Renal ROS     Musculoskeletal  (+) Fibromyalgia -  Abdominal (+) + obese,   Peds  Hematology  (+) Blood dyscrasia, ,   Anesthesia Other Findings RETINAL ATTACHMENT RIGHT EYE,RETINAL HOLES IN BOTH EYERE, LATTICE IN BOTH EYES  Reproductive/Obstetrics hcg negative                           Anesthesia  Physical Anesthesia Plan  ASA: IV  Anesthesia Plan: General   Post-op Pain Management:    Induction: Intravenous  PONV Risk Score and Plan: 3 and Ondansetron, Dexamethasone, Midazolam and Treatment may vary due to age or medical condition  Airway Management Planned: Oral ETT and Video Laryngoscope Planned  Additional Equipment:   Intra-op Plan:   Post-operative Plan: Extubation in OR  Informed Consent: I have reviewed the patients History and Physical, chart, labs and discussed the procedure including the risks, benefits and alternatives for the proposed anesthesia with the patient or authorized representative who has indicated his/her understanding and acceptance.     Dental advisory given  Plan Discussed with: CRNA  Anesthesia Plan Comments:        Anesthesia Quick Evaluation

## 2020-12-12 NOTE — Progress Notes (Signed)
PCP - Dorothyann Peng, NP  Cardiologist - n/a Neuro - Dr Asencion Partridge Dohmeier  Chest x-ray - 12/12/20 (2V) EKG - 06/14/20 Stress Test - n/a ECHO - 10/03/20 Cardiac Cath - n/a  Sleep Study -  Yes CPAP - does not use cpap  Anesthesia review: Yes  STOP now taking any Aspirin (unless otherwise instructed by your surgeon), Aleve, Naproxen, Ibuprofen, Motrin, Advil, Goody's, BC's, all herbal medications, fish oil, and all vitamins.   Coronavirus Screening Covid test on 12/11/20 was negative. Do you have any of the following symptoms:  Cough yes/no: No Fever (>100.73F)  yes/no: No Runny nose yes/no: No Sore throat yes/no: No Difficulty breathing/shortness of breath  yes/no: No  Have you traveled in the last 14 days and where? yes/no: No  Patient verbalized understanding of instructions that were given via phone.

## 2020-12-13 ENCOUNTER — Encounter (HOSPITAL_COMMUNITY)
Admission: RE | Disposition: A | Discharge status: Home / Self Care | Payer: Self-pay | Source: Home / Self Care | Attending: Ophthalmology

## 2020-12-13 ENCOUNTER — Ambulatory Visit (HOSPITAL_COMMUNITY)
Admission: RE | Admit: 2020-12-13 | Discharge: 2020-12-13 | Disposition: A | Discharge status: Home / Self Care | Payer: Medicaid Other | Attending: Ophthalmology | Admitting: Ophthalmology

## 2020-12-13 ENCOUNTER — Encounter (HOSPITAL_COMMUNITY): Payer: Self-pay | Admitting: Ophthalmology

## 2020-12-13 ENCOUNTER — Other Ambulatory Visit: Payer: Self-pay

## 2020-12-13 ENCOUNTER — Ambulatory Visit (HOSPITAL_COMMUNITY): Payer: Medicaid Other | Admitting: Anesthesiology

## 2020-12-13 DIAGNOSIS — Z79899 Other long term (current) drug therapy: Secondary | ICD-10-CM | POA: Insufficient documentation

## 2020-12-13 DIAGNOSIS — H35413 Lattice degeneration of retina, bilateral: Secondary | ICD-10-CM | POA: Diagnosis not present

## 2020-12-13 DIAGNOSIS — Z888 Allergy status to other drugs, medicaments and biological substances status: Secondary | ICD-10-CM | POA: Insufficient documentation

## 2020-12-13 DIAGNOSIS — H3321 Serous retinal detachment, right eye: Secondary | ICD-10-CM | POA: Diagnosis not present

## 2020-12-13 DIAGNOSIS — Z6841 Body Mass Index (BMI) 40.0 and over, adult: Secondary | ICD-10-CM | POA: Insufficient documentation

## 2020-12-13 DIAGNOSIS — Z8249 Family history of ischemic heart disease and other diseases of the circulatory system: Secondary | ICD-10-CM | POA: Insufficient documentation

## 2020-12-13 DIAGNOSIS — G4733 Obstructive sleep apnea (adult) (pediatric): Secondary | ICD-10-CM | POA: Diagnosis not present

## 2020-12-13 DIAGNOSIS — Z791 Long term (current) use of non-steroidal anti-inflammatories (NSAID): Secondary | ICD-10-CM | POA: Insufficient documentation

## 2020-12-13 DIAGNOSIS — Z8759 Personal history of other complications of pregnancy, childbirth and the puerperium: Secondary | ICD-10-CM | POA: Insufficient documentation

## 2020-12-13 DIAGNOSIS — E559 Vitamin D deficiency, unspecified: Secondary | ICD-10-CM | POA: Diagnosis not present

## 2020-12-13 DIAGNOSIS — K219 Gastro-esophageal reflux disease without esophagitis: Secondary | ICD-10-CM | POA: Insufficient documentation

## 2020-12-13 DIAGNOSIS — Z885 Allergy status to narcotic agent status: Secondary | ICD-10-CM | POA: Diagnosis not present

## 2020-12-13 DIAGNOSIS — K589 Irritable bowel syndrome without diarrhea: Secondary | ICD-10-CM | POA: Insufficient documentation

## 2020-12-13 DIAGNOSIS — E119 Type 2 diabetes mellitus without complications: Secondary | ICD-10-CM | POA: Diagnosis not present

## 2020-12-13 DIAGNOSIS — I10 Essential (primary) hypertension: Secondary | ICD-10-CM | POA: Diagnosis not present

## 2020-12-13 DIAGNOSIS — Z833 Family history of diabetes mellitus: Secondary | ICD-10-CM | POA: Insufficient documentation

## 2020-12-13 DIAGNOSIS — G4730 Sleep apnea, unspecified: Secondary | ICD-10-CM | POA: Insufficient documentation

## 2020-12-13 HISTORY — PX: SCLERAL BUCKLE: SHX5340

## 2020-12-13 HISTORY — DX: Fibromyalgia: M79.7

## 2020-12-13 HISTORY — DX: Prediabetes: R73.03

## 2020-12-13 HISTORY — PX: PHOTOCOAGULATION WITH LASER: SHX6027

## 2020-12-13 LAB — BASIC METABOLIC PANEL
Anion gap: 10 (ref 5–15)
BUN: 11 mg/dL (ref 6–20)
CO2: 23 mmol/L (ref 22–32)
Calcium: 9 mg/dL (ref 8.9–10.3)
Chloride: 104 mmol/L (ref 98–111)
Creatinine, Ser: 0.52 mg/dL (ref 0.44–1.00)
GFR, Estimated: >60 mL/min (ref >60)
Glucose, Bld: 132 mg/dL — ABNORMAL HIGH (ref 70–99)
Potassium: 4.1 mmol/L (ref 3.5–5.1)
Sodium: 137 mmol/L (ref 135–145)

## 2020-12-13 LAB — CBC
HCT: 41.5 % (ref 36.0–46.0)
Hemoglobin: 12.6 g/dL (ref 12.0–15.0)
MCH: 22.7 pg — ABNORMAL LOW (ref 26.0–34.0)
MCHC: 30.4 g/dL (ref 30.0–36.0)
MCV: 74.8 fL — ABNORMAL LOW (ref 80.0–100.0)
Platelets: 269 10*3/uL (ref 150–400)
RBC: 5.55 MIL/uL — ABNORMAL HIGH (ref 3.87–5.11)
RDW: 15.3 % (ref 11.5–15.5)
WBC: 8.4 10*3/uL (ref 4.0–10.5)
nRBC: 0 % (ref 0.0–0.2)

## 2020-12-13 LAB — GLUCOSE, CAPILLARY
Glucose-Capillary: 120 mg/dL — ABNORMAL HIGH (ref 70–99)
Glucose-Capillary: 142 mg/dL — ABNORMAL HIGH (ref 70–99)

## 2020-12-13 LAB — POCT PREGNANCY, URINE: Preg Test, Ur: NEGATIVE

## 2020-12-13 SURGERY — SCLERAL BUCKLE
Anesthesia: General | Site: Eye | Laterality: Right

## 2020-12-13 MED ORDER — BUPIVACAINE HCL (PF) 0.75 % IJ SOLN
INTRAMUSCULAR | Status: AC
  Filled 2020-12-13: qty 10

## 2020-12-13 MED ORDER — LIDOCAINE 2% (20 MG/ML) 5 ML SYRINGE
INTRAMUSCULAR | Status: DC | PRN
  Administered 2020-12-13: 60 mg via INTRAVENOUS

## 2020-12-13 MED ORDER — ORAL CARE MOUTH RINSE: 15.0000 mL | Freq: Once | OROMUCOSAL | Status: AC

## 2020-12-13 MED ORDER — SUGAMMADEX SODIUM 200 MG/2ML IV SOLN
INTRAVENOUS | Status: DC | PRN
  Administered 2020-12-13: 500 mg via INTRAVENOUS

## 2020-12-13 MED ORDER — SUCCINYLCHOLINE CHLORIDE 200 MG/10ML IV SOSY
PREFILLED_SYRINGE | INTRAVENOUS | Status: AC
  Filled 2020-12-13: qty 10

## 2020-12-13 MED ORDER — KETOROLAC TROMETHAMINE 30 MG/ML IJ SOLN: 30.0000 mg | Freq: Once | INTRAMUSCULAR | Status: DC

## 2020-12-13 MED ORDER — SUGAMMADEX SODIUM 500 MG/5ML IV SOLN
INTRAVENOUS | Status: AC
  Filled 2020-12-13: qty 5

## 2020-12-13 MED ORDER — FENTANYL CITRATE (PF) 100 MCG/2ML IJ SOLN
INTRAMUSCULAR | Status: DC | PRN
  Administered 2020-12-13: 150 ug via INTRAVENOUS

## 2020-12-13 MED ORDER — FENTANYL CITRATE (PF) 100 MCG/2ML IJ SOLN: 25.0000 ug | INTRAMUSCULAR | Status: DC | PRN

## 2020-12-13 MED ORDER — ROCURONIUM BROMIDE 10 MG/ML (PF) SYRINGE
PREFILLED_SYRINGE | INTRAVENOUS | Status: DC | PRN
  Administered 2020-12-13: 10 mg via INTRAVENOUS
  Administered 2020-12-13 (×2): 20 mg via INTRAVENOUS

## 2020-12-13 MED ORDER — HYPROMELLOSE (GONIOSCOPIC) 2.5 % OP SOLN
OPHTHALMIC | Status: DC | PRN
  Administered 2020-12-13: 1 [drp] via OPHTHALMIC

## 2020-12-13 MED ORDER — STERILE WATER FOR INJECTION IJ SOLN
INTRAMUSCULAR | Status: AC
  Filled 2020-12-13: qty 10

## 2020-12-13 MED ORDER — SUCCINYLCHOLINE CHLORIDE 200 MG/10ML IV SOSY
PREFILLED_SYRINGE | INTRAVENOUS | Status: DC | PRN
  Administered 2020-12-13: 200 mg via INTRAVENOUS

## 2020-12-13 MED ORDER — PHENYLEPHRINE HCL 2.5 % OP SOLN
1.0000 [drp] | OPHTHALMIC | Status: DC | PRN
  Administered 2020-12-13 (×3): 1 [drp] via OPHTHALMIC
  Filled 2020-12-13: qty 2

## 2020-12-13 MED ORDER — BSS IO SOLN
INTRAOCULAR | Status: AC
  Filled 2020-12-13: qty 15

## 2020-12-13 MED ORDER — DEXAMETHASONE SODIUM PHOSPHATE 10 MG/ML IJ SOLN
INTRAMUSCULAR | Status: AC
  Filled 2020-12-13: qty 1

## 2020-12-13 MED ORDER — HYALURONIDASE HUMAN 150 UNIT/ML IJ SOLN
INTRAMUSCULAR | Status: AC
  Filled 2020-12-13: qty 1

## 2020-12-13 MED ORDER — HYALURONIDASE HUMAN 150 UNIT/ML IJ SOLN
INTRAMUSCULAR | Status: DC | PRN
  Administered 2020-12-13: 150 [IU] via SUBCUTANEOUS

## 2020-12-13 MED ORDER — DEXAMETHASONE SODIUM PHOSPHATE 10 MG/ML IJ SOLN
INTRAMUSCULAR | Status: DC | PRN
  Administered 2020-12-13: 10 mg

## 2020-12-13 MED ORDER — LIDOCAINE HCL (PF) 1 % IJ SOLN
INTRAMUSCULAR | Status: AC
  Filled 2020-12-13: qty 30

## 2020-12-13 MED ORDER — BSS IO SOLN
INTRAOCULAR | Status: DC | PRN
  Administered 2020-12-13 (×2): 15 mL via INTRAOCULAR

## 2020-12-13 MED ORDER — CEFAZOLIN SODIUM 1 G IJ SOLR
INTRAMUSCULAR | Status: DC | PRN
  Administered 2020-12-13: 1 g via INTRAMUSCULAR

## 2020-12-13 MED ORDER — EPINEPHRINE PF 1 MG/ML IJ SOLN
INTRAMUSCULAR | Status: AC
  Filled 2020-12-13: qty 1

## 2020-12-13 MED ORDER — PROMETHAZINE HCL 25 MG/ML IJ SOLN: 6.2500 mg | INTRAMUSCULAR | Status: DC | PRN

## 2020-12-13 MED ORDER — LIDOCAINE 2% (20 MG/ML) 5 ML SYRINGE
INTRAMUSCULAR | Status: AC
  Filled 2020-12-13: qty 5

## 2020-12-13 MED ORDER — MIDAZOLAM HCL 5 MG/5ML IJ SOLN
INTRAMUSCULAR | Status: DC | PRN
  Administered 2020-12-13: 2 mg via INTRAVENOUS

## 2020-12-13 MED ORDER — CEFAZOLIN SODIUM-DEXTROSE 1-4 GM/50ML-% IV SOLN
INTRAVENOUS | Status: AC
  Filled 2020-12-13: qty 50

## 2020-12-13 MED ORDER — TOBRAMYCIN-DEXAMETHASONE 0.3-0.1 % OP OINT
TOPICAL_OINTMENT | OPHTHALMIC | Status: AC
  Filled 2020-12-13: qty 3.5

## 2020-12-13 MED ORDER — PHENYLEPHRINE 40 MCG/ML (10ML) SYRINGE FOR IV PUSH (FOR BLOOD PRESSURE SUPPORT)
PREFILLED_SYRINGE | INTRAVENOUS | Status: AC
  Filled 2020-12-13: qty 10

## 2020-12-13 MED ORDER — CEFAZOLIN SUBCONJUNCTIVAL INJECTION 100 MG/0.5 ML
100.0000 mg | INJECTION | SUBCONJUNCTIVAL | Status: DC
  Filled 2020-12-13: qty 5

## 2020-12-13 MED ORDER — LIDOCAINE HCL 2 % IJ SOLN
INTRAMUSCULAR | Status: AC
  Filled 2020-12-13: qty 20

## 2020-12-13 MED ORDER — BSS PLUS IO SOLN
INTRAOCULAR | Status: AC
  Filled 2020-12-13: qty 500

## 2020-12-13 MED ORDER — TETRACAINE HCL 0.5 % OP SOLN
OPHTHALMIC | Status: AC
  Filled 2020-12-13: qty 4

## 2020-12-13 MED ORDER — PROPOFOL 10 MG/ML IV BOLUS
INTRAVENOUS | Status: DC | PRN
  Administered 2020-12-13: 250 mg via INTRAVENOUS

## 2020-12-13 MED ORDER — DEXAMETHASONE SODIUM PHOSPHATE 10 MG/ML IJ SOLN
INTRAMUSCULAR | Status: DC | PRN
  Administered 2020-12-13: 10 mg via INTRAVENOUS

## 2020-12-13 MED ORDER — BUPIVACAINE HCL (PF) 0.75 % IJ SOLN
INTRAMUSCULAR | Status: DC | PRN
  Administered 2020-12-13: 4 mL

## 2020-12-13 MED ORDER — LIDOCAINE HCL 2 % IJ SOLN
INTRAMUSCULAR | Status: DC | PRN
  Administered 2020-12-13: 4 mL

## 2020-12-13 MED ORDER — HYPROMELLOSE (GONIOSCOPIC) 2.5 % OP SOLN
OPHTHALMIC | Status: AC
  Filled 2020-12-13: qty 15

## 2020-12-13 MED ORDER — ATROPINE SULFATE 1 % OP SOLN
1.0000 [drp] | OPHTHALMIC | Status: DC | PRN
  Administered 2020-12-13 (×3): 1 [drp] via OPHTHALMIC
  Filled 2020-12-13: qty 5

## 2020-12-13 MED ORDER — SODIUM CHLORIDE 0.9 % IV SOLN: INTRAVENOUS | Status: DC | PRN

## 2020-12-13 MED ORDER — ONDANSETRON HCL 4 MG/2ML IJ SOLN
INTRAMUSCULAR | Status: AC
  Filled 2020-12-13: qty 2

## 2020-12-13 MED ORDER — CEFAZOLIN SUBCONJUNCTIVAL INJECTION 100 MG/0.5 ML
INJECTION | SUBCONJUNCTIVAL | Status: DC | PRN
  Administered 2020-12-13: 100 mg via SUBCONJUNCTIVAL

## 2020-12-13 MED ORDER — ONDANSETRON HCL 4 MG/2ML IJ SOLN
INTRAMUSCULAR | Status: DC | PRN
  Administered 2020-12-13: 4 mg via INTRAVENOUS

## 2020-12-13 MED ORDER — FENTANYL CITRATE (PF) 250 MCG/5ML IJ SOLN
INTRAMUSCULAR | Status: AC
  Filled 2020-12-13: qty 5

## 2020-12-13 MED ORDER — MIDAZOLAM HCL 2 MG/2ML IJ SOLN
INTRAMUSCULAR | Status: AC
  Filled 2020-12-13: qty 2

## 2020-12-13 MED ORDER — CHLORHEXIDINE GLUCONATE 0.12 % MT SOLN
15.0000 mL | Freq: Once | OROMUCOSAL | Status: AC
  Administered 2020-12-13: 15 mL via OROMUCOSAL
  Filled 2020-12-13: qty 15

## 2020-12-13 MED ORDER — PHENYLEPHRINE 40 MCG/ML (10ML) SYRINGE FOR IV PUSH (FOR BLOOD PRESSURE SUPPORT)
PREFILLED_SYRINGE | INTRAVENOUS | Status: DC | PRN
  Administered 2020-12-13 (×4): 80 ug via INTRAVENOUS

## 2020-12-13 MED ORDER — ACETAMINOPHEN 10 MG/ML IV SOLN: 1000.0000 mg | Freq: Once | INTRAVENOUS | Status: DC | PRN

## 2020-12-13 MED ORDER — PROPOFOL 10 MG/ML IV BOLUS
INTRAVENOUS | Status: AC
  Filled 2020-12-13: qty 40

## 2020-12-13 MED ORDER — ROCURONIUM BROMIDE 10 MG/ML (PF) SYRINGE
PREFILLED_SYRINGE | INTRAVENOUS | Status: AC
  Filled 2020-12-13: qty 10

## 2020-12-13 MED ORDER — LACTATED RINGERS IV SOLN: INTRAVENOUS | Status: DC

## 2020-12-13 SURGICAL SUPPLY — 49 items
APL SRG 3 HI ABS STRL LF PLS (MISCELLANEOUS) ×1
APL SWBSTK 6 STRL LF DISP (MISCELLANEOUS) ×1
APPLICATOR COTTON TIP 6 STRL (MISCELLANEOUS) ×2 IMPLANT
APPLICATOR COTTON TIP 6IN STRL (MISCELLANEOUS) ×3
APPLICATOR DR MATTHEWS STRL (MISCELLANEOUS) ×3 IMPLANT
BAND SCLERAL BUCKLING TYPE 42 (Ophthalmic Related) ×3 IMPLANT
BAND WRIST GAS GREEN (MISCELLANEOUS) IMPLANT
BLADE MINI 60D BLUE (BLADE) ×3 IMPLANT
BLADE MINI RND TIP GREEN BEAV (BLADE) IMPLANT
CABLE BIPOLOR RESECTION CORD (MISCELLANEOUS) IMPLANT
CANNULA ANT CHAM MAIN (OPHTHALMIC RELATED) IMPLANT
CANNULA DUALBORE 25G (CANNULA) ×3 IMPLANT
CAUTERY EYE LOW TEMP 1300F FIN (OPHTHALMIC RELATED) IMPLANT
COVER SURGICAL LIGHT HANDLE (MISCELLANEOUS) ×3 IMPLANT
COVER WAND RF STERILE (DRAPES) IMPLANT
FILTER BLUE MILLIPORE (MISCELLANEOUS) IMPLANT
FILTER STRAW FLUID ASPIR (MISCELLANEOUS) IMPLANT
GAS OPHTHALMIC (MISCELLANEOUS) IMPLANT
GAS WRIST BAND GREEN (MISCELLANEOUS)
GLOVE BIO SURGEON STRL SZ7.5 (GLOVE) ×3 IMPLANT
GOWN STRL REUS W/ TWL LRG LVL3 (GOWN DISPOSABLE) ×4 IMPLANT
GOWN STRL REUS W/TWL LRG LVL3 (GOWN DISPOSABLE) ×6
KIT BASIN OR (CUSTOM PROCEDURE TRAY) ×3 IMPLANT
KIT PERFLUORON PROCEDURE 5ML (MISCELLANEOUS) IMPLANT
KIT TURNOVER KIT B (KITS) ×3 IMPLANT
NEEDLE 18GX1X1/2 (RX/OR ONLY) (NEEDLE) ×3 IMPLANT
NEEDLE HYPO 25GX1X1/2 BEV (NEEDLE) IMPLANT
NEEDLE HYPO 30X.5 LL (NEEDLE) ×6 IMPLANT
NS IRRIG 1000ML POUR BTL (IV SOLUTION) ×3 IMPLANT
PACK VITRECTOMY CUSTOM (CUSTOM PROCEDURE TRAY) ×3 IMPLANT
PAD ARMBOARD 7.5X6 YLW CONV (MISCELLANEOUS) ×6 IMPLANT
PAK PIK VITRECTOMY CVS 25GA (OPHTHALMIC) IMPLANT
PROBE LASER ILLUM FLEX CVD 25G (OPHTHALMIC) IMPLANT
REPL STRA BRUSH NEEDLE (NEEDLE) IMPLANT
RESERVOIR BACK FLUSH (MISCELLANEOUS) IMPLANT
RETRACTOR IRIS FLEX 25G GRIESH (INSTRUMENTS) IMPLANT
ROLLS DENTAL (MISCELLANEOUS) IMPLANT
SET INJECTOR OIL FLUID CONSTEL (OPHTHALMIC) IMPLANT
SHEET MEDIUM DRAPE 40X70 STRL (DRAPES) ×3 IMPLANT
STOCKINETTE IMPERVIOUS 9X36 MD (GAUZE/BANDAGES/DRESSINGS) ×6 IMPLANT
STOPCOCK 4 WAY LG BORE MALE ST (IV SETS) IMPLANT
SUT ETHILON 5.0 S-24 (SUTURE) ×6 IMPLANT
SUT SILK 2 0 (SUTURE) ×3
SUT SILK 2-0 18XBRD TIE 12 (SUTURE) ×2 IMPLANT
SUT VICRYL 7 0 TG140 8 (SUTURE) IMPLANT
SYR 20ML LL LF (SYRINGE) IMPLANT
TOWEL GREEN STERILE FF (TOWEL DISPOSABLE) ×6 IMPLANT
WATER STERILE IRR 1000ML POUR (IV SOLUTION) ×3 IMPLANT
WIPE INSTRUMENT VISIWIPE 73X73 (MISCELLANEOUS) IMPLANT

## 2020-12-13 NOTE — Anesthesia Procedure Notes (Signed)
Procedure Name: Intubation Date/Time: 12/13/2020 7:52 AM Performed by: Jenne Campus, CRNA Pre-anesthesia Checklist: Patient identified, Emergency Drugs available, Suction available and Patient being monitored Patient Re-evaluated:Patient Re-evaluated prior to induction Oxygen Delivery Method: Circle System Utilized Preoxygenation: Pre-oxygenation with 100% oxygen Induction Type: IV induction Ventilation: Mask ventilation without difficulty Laryngoscope Size: Glidescope and 4 Grade View: Grade I Tube type: Oral Tube size: 8.0 mm Number of attempts: 1 Airway Equipment and Method: Stylet and Oral airway Placement Confirmation: ETT inserted through vocal cords under direct vision,  positive ETCO2 and breath sounds checked- equal and bilateral Secured at: 23 cm Tube secured with: Tape Dental Injury: Teeth and Oropharynx as per pre-operative assessment

## 2020-12-13 NOTE — H&P (Signed)
Maureen Lewis is an 25 y.o. female.   Chief Complaint: vision changes right eye HPI: diagnosed with retinal detachment right eye and lattice and atrophic holes left eye  Past Medical History:  Diagnosis Date  . Abnormal uterine bleeding   . Acid reflux   . Alpha thalassemia trait   . Amenorrhea   . Anemia    no current med.  . Back pain   . Cesarean delivery delivered 10/11/2019   10/09/2019 - primary CS for failed IOL  . Chest pain    resolved  . Complication of anesthesia    states had to keep giving her anesthesia during EGD  . Constipation   . Depression    "I'm good"  . Fibromyalgia   . Finger mass, left 03/2017   middle finger  . Gestational diabetes 05/23/2019   hx - with pregnancy only   . Gestational hypertension 09/23/2019   Guidelines for Antenatal Testing and Sonography  (with updated ICD-10 codes)  Updated  18-Sep-2019 with Dr. Tama High  INDICATION U/S 2 X week NST/AFI  or full BPP wkly DELIVERY Diabetes   A1 - good control - O24.410    A2 - good control - O24.419      A2  - poor control or poor compliance - O24.419, E11.65   (Macrosomia or polyhydramnios) **E11.65 is extra code for poor control**    A2/B - O24.  Marland Kitchen Headache   . Hypertension   . Infection    UTI  . Irritable bowel syndrome (IBS)   . Joint pain   . Morbid obesity with body mass index (BMI) of 45.0 to 49.9 in adult [facility])   . Obesity during pregnancy, antepartum 04/06/2019   Body mass index is 53.71 kg/m.  Recommendations [x]  Aspirin 81 mg daily after 12 weeks; discontinue after 36 weeks [ ]  Nutrition consult [ ]  Weight gain 11-20 lbs for singleton and 25-35 lbs for twin pregnancy (IOM guidelines) Higher class of obesity patients recommended to gain closer to lower limit  Weight loss is associated with adverse outcomes [ ]  Baseline and surveillance labs (pulled in fr  . Plantar fasciitis, bilateral    resolved  . Polycystic ovary syndrome   . Pre-diabetes    no meds, diet controlled,  diet not check blood sugar  . Prediabetes    "prediabetes"  . Pregnancy induced hypertension   . Shortness of breath    albuterol inhaler  . Sleep apnea    no CPAP use  . Supervision of normal first pregnancy 04/06/2019   [name]: [ x]Initial [ x]12 [ ] 20 [ ] 28 [ ] 32 [ ] 36 [ ] 38 [ ] 39 [ ] 40 Nursing Staff Provider Office Location [facility]  Dating   LMP Language  English  Anatomy US  Nml Flu Vaccine  Declined 07/28/19 Genetic Screen  NIPS: low risks   AFP:   negative  TDaP vaccine   08/25/19 Hgb A1C or  GTT Early A1C 5.8 Third trimester: GDM insulin Rhogam  NA   LAB RESULTS  Feeding Plan Breast Blood Type  . Vertigo 2017    Past Surgical History:  Procedure Laterality Date  . CESAREAN SECTION N/A 10/09/2019   Procedure: CESAREAN SECTION;  Surgeon: Aletha Halim, MD;  Location: [facility];  Service: Obstetrics;  Laterality: N/A;  . COLONOSCOPY WITH PROPOFOL  10/07/2016  . DILATION AND CURETTAGE OF UTERUS    . ESOPHAGOGASTRODUODENOSCOPY  12/21/2015  . EXCISION MASS UPPER EXTREMETIES Left 04/21/2017   Procedure: EXCISION  MASS LEFT MIDDLE FINGER;  Surgeon: Daryll Brod, MD;  Location: [place];  Service: Orthopedics;  Laterality: Left;  . IR FL GUIDED LOC OF NEEDLE/CATH TIP FOR SPINAL INJECTION RT  03/13/2020    Family History  Problem Relation Age of Onset  . Diabetes Maternal Aunt   . Hypertension Maternal Uncle   . Depression Maternal Grandfather   . Diabetes Maternal Grandfather   . Hypertension Maternal Grandfather   . Arthritis Mother   . High blood pressure Mother   . Depression Mother   . Sleep apnea Mother   . Obesity Mother   . Diabetes Mother   . Hypertension Mother   . Kidney failure Mother   . Sleep apnea Father   . Obesity Father   . Healthy Daughter    Social History:  reports that she has never smoked. She has never used smokeless tobacco. She reports previous alcohol use. She reports that she does not use drugs.  Allergies:   Allergies  Allergen Reactions  . Bee Pollen Hives, Shortness Of Breath and Swelling  . Bee Venom Hives, Shortness Of Breath and Swelling  . Hydrocodone-Acetaminophen Anaphylaxis    No problem when she takes Tylenol  . Peach Flavor Hives, Shortness Of Breath and Other (See Comments)    SWELLING OF MOUTH  . Pollen Extract Hives, Shortness Of Breath and Swelling  . Remdesivir Swelling    Angioedema 8/25    Medications Prior to Admission  Medication Sig Dispense Refill  . acetaZOLAMIDE (DIAMOX) 500 MG capsule Take 1 capsule (500 mg total) by mouth 2 (two) times daily. For headache 60 capsule 2  . albuterol (VENTOLIN HFA) 108 (90 Base) MCG/ACT inhaler Inhale 2 puffs into the lungs every 6 (six) hours as needed for wheezing or shortness of breath. 8 g 0  . amLODipine (NORVASC) 10 MG tablet Take 1 tablet (10 mg total) by mouth daily. 90 tablet 3  . baclofen (LIORESAL) 10 MG tablet Take 1 tablet (10 mg total) by mouth 2 (two) times daily. 30 each 0  . diclofenac (VOLTAREN) 75 MG EC tablet Take 1 tablet (75 mg total) by mouth 2 (two) times daily. 60 tablet 1  . ferrous sulfate 325 (65 FE) MG tablet Take 1 tablet (325 mg total) by mouth every other day. (Patient taking differently: Take 325 mg by mouth every Monday, Wednesday, and Friday.) 45 tablet 1  . gabapentin (NEURONTIN) 100 MG capsule Take 100 mg by mouth at bedtime.    . hydrochlorothiazide (HYDRODIURIL) 25 MG tablet Take 1 tablet (25 mg total) by mouth daily. 90 tablet 3  . loratadine (CLARITIN) 10 MG tablet Take 10 mg by mouth daily as needed for allergies.     Marland Kitchen ofloxacin (OCUFLOX) 0.3 % ophthalmic solution Place 1 drop into the right eye in the morning, at noon, in the evening, and at bedtime.    Marland Kitchen oxyCODONE-acetaminophen (PERCOCET/ROXICET) 5-325 MG tablet Take 1-2 tablets by mouth every 6 (six) hours as needed for severe pain. (Patient taking differently: Take 1 tablet by mouth 5 (five) times daily.) 20 tablet 0  . polyethylene glycol  powder (GLYCOLAX/MIRALAX) 17 GM/SCOOP powder Take 17 g by mouth daily as needed for moderate constipation.     Marland Kitchen tobramycin-dexamethasone (TOBRADEX) ophthalmic ointment Place 1 application into the right eye 2 (two) times daily.    Marland Kitchen topiramate (TOPAMAX) 25 MG tablet Take 2 tablets (50 mg total) by mouth 2 (two) times daily. 180 tablet 1  . Vitamin D,  Ergocalciferol, (DRISDOL) 1.25 MG (50000 UNIT) CAPS capsule Take 1 capsule (50,000 Units total) by mouth every 7 (seven) days. (Patient taking differently: Take 50,000 Units by mouth every Monday.) 4 capsule 0  . ibuprofen (ADVIL) 800 MG tablet Take 800 mg by mouth every 8 (eight) hours as needed.    . prednisoLONE acetate (PRED FORTE) 1 % ophthalmic suspension Place 1 drop into the right eye in the morning, at noon, in the evening, and at bedtime.      Results for orders placed or performed during the hospital encounter of 12/13/20 (from the past 48 hour(s))  Basic metabolic panel per protocol     Status: Abnormal   Collection Time: 12/13/20  5:56 AM  Result Value Ref Range   Sodium 137 135 - 145 mmol/L   Potassium 4.1 3.5 - 5.1 mmol/L   Chloride 104 98 - 111 mmol/L   CO2 23 22 - 32 mmol/L   Glucose, Bld 132 (H) 70 - 99 mg/dL    Comment: Glucose reference range applies only to samples taken after fasting for at least 8 hours.   BUN 11 6 - 20 mg/dL   Creatinine, Ser 0.52 0.44 - 1.00 mg/dL   Calcium 9.0 8.9 - 10.3 mg/dL   GFR, Estimated >60 >60 mL/min    Comment: (NOTE) Calculated using the CKD-EPI Creatinine Equation (2021)    Anion gap 10 5 - 15    Comment: Performed at [facility] [address], [place], [place] [address]  CBC per protocol     Status: Abnormal   Collection Time: 12/13/20  5:56 AM  Result Value Ref Range   WBC 8.4 4.0 - 10.5 K/uL   RBC 5.55 (H) 3.87 - 5.11 MIL/uL   Hemoglobin 12.6 12.0 - 15.0 g/dL   HCT 41.5 36.0 - 46.0 %   MCV 74.8 (L) 80.0 - 100.0 fL   MCH 22.7 (L) 26.0 - 34.0 pg   MCHC 30.4 30.0 - 36.0  g/dL   RDW 15.3 11.5 - 15.5 %   Platelets 269 150 - 400 K/uL   nRBC 0.0 0.0 - 0.2 %    Comment: Performed at [facility], [place] [address] [place], [place] [address]  Glucose, capillary     Status: Abnormal   Collection Time: 12/13/20  6:02 AM  Result Value Ref Range   Glucose-Capillary 120 (H) 70 - 99 mg/dL    Comment: Glucose reference range applies only to samples taken after fasting for at least 8 hours.  Pregnancy, urine POC     Status: None   Collection Time: 12/13/20  6:21 AM  Result Value Ref Range   Preg Test, Ur NEGATIVE NEGATIVE    Comment:        THE SENSITIVITY OF THIS METHODOLOGY IS >24 mIU/mL    DG Chest 2 View  Result Date: 12/12/2020 CLINICAL DATA:  Chest pain and shortness of breath EXAM: CHEST - 2 VIEW COMPARISON:  10/24/2020 FINDINGS: The heart size and mediastinal contours are within normal limits. Both lungs are clear. No pleural effusion. No pneumothorax. The visualized skeletal structures are unremarkable. IMPRESSION: No acute process in the chest. Electronically Signed   By: Macy Mis M.D.   On: 12/12/2020 09:55    Review of Systems  Eyes: Positive for visual disturbance.  All other systems reviewed and are negative.   Blood pressure (!) 145/69, pulse 93, temperature 98.4 F (36.9 C), temperature source Oral, resp. rate 20, height 6' 2.5" (1.892  m), weight (!) 217.3 kg, last menstrual period 11/13/2020, SpO2 97 %, not currently breastfeeding. Physical Exam Constitutional:      Appearance: She is obese.  Eyes:   Neurological:     Mental Status: She is alert.      Assessment/Plan 1. RD OD: Scleral buckle right eye 2. Lattice and atrophic holes OS: Laser demarcation OS  Murray Guzzetta B, MD 12/13/2020, 7:09 AM

## 2020-12-13 NOTE — Transfer of Care (Signed)
Immediate Anesthesia Transfer of Care Note  Patient: Maureen Lewis  Procedure(s) Performed: SCLERAL BUCKLE WITH CRYO (Right Eye) RETINOPLEXY LEFT EYE (Left Eye)  Patient Location: PACU  Anesthesia Type:General  Level of Consciousness: oriented, drowsy and patient cooperative  Airway & Oxygen Therapy: Patient Spontanous Breathing and Patient connected to face mask oxygen  Post-op Assessment: Report given to RN and Post -op Vital signs reviewed and stable  Post vital signs: Reviewed  Last Vitals:  Vitals Value Taken Time  BP 133/71 12/13/20 1023  Temp    Pulse 102 12/13/20 1026  Resp 33 12/13/20 1026  SpO2 97 % 12/13/20 1026  Vitals shown include unvalidated device data.  Last Pain:  Vitals:   12/13/20 1023  TempSrc:   PainSc: Asleep         Complications: No complications documented.

## 2020-12-13 NOTE — Anesthesia Postprocedure Evaluation (Signed)
Anesthesia Post Note  Patient: Maureen Lewis  Procedure(s) Performed: SCLERAL BUCKLE WITH CRYO (Right Eye) RETINOPLEXY LEFT EYE (Left Eye)     Patient location during evaluation: PACU Anesthesia Type: General Level of consciousness: awake Pain management: pain level controlled Vital Signs Assessment: post-procedure vital signs reviewed and stable Respiratory status: spontaneous breathing, nonlabored ventilation, respiratory function stable and patient connected to nasal cannula oxygen Cardiovascular status: blood pressure returned to baseline and stable Postop Assessment: no apparent nausea or vomiting Anesthetic complications: no   No complications documented.  Last Vitals:  Vitals:   12/13/20 1038 12/13/20 1053  BP: (!) 142/68 (!) 144/89  Pulse: (!) 102 (!) 102  Resp: (!) 31 18  Temp:  36.5 C  SpO2: 93% 93%    Last Pain:  Vitals:   12/13/20 1053  TempSrc:   PainSc: 0-No pain                 Mykeal Carrick P Thanya Cegielski

## 2020-12-13 NOTE — Brief Op Note (Signed)
12/13/2020  10:31 AM  PATIENT:  Maureen Lewis  25 y.o. female  PRE-OPERATIVE DIAGNOSIS:  RETINAL DETACHMENT RIGHT EYE,RETINAL HOLES IN BOTH EYES, LATTICE IN BOTH EYES  POST-OPERATIVE DIAGNOSIS:  RETINAL DETACHMENT RIGHT EYE,RETINAL HOLES IN BOTH EYES, LATTICE IN BOTH EYES  PROCEDURE:  Procedure(s): SCLERAL BUCKLE WITH CRYO (Right) RETINOPLEXY LEFT EYE (Left)  SURGEON:  Surgeon(s) and Role:    Sherlynn Stalls, MD - Primary  PHYSICIAN ASSISTANT:   ASSISTANTS: none   ANESTHESIA:   general  EBL:  5 mL   BLOOD ADMINISTERED:none  DRAINS: none   LOCAL MEDICATIONS USED:  BUPIVICAINE   SPECIMEN:  No Specimen  DISPOSITION OF SPECIMEN:  N/A  COUNTS:  YES  TOURNIQUET:  * No tourniquets in log *  DICTATION: .Other Dictation: Dictation Number [id number]  PLAN OF CARE: Discharge to home after PACU  PATIENT DISPOSITION:  PACU - hemodynamically stable.   Delay start of Pharmacological VTE agent (>24hrs) due to surgical blood loss or risk of bleeding: not applicable

## 2020-12-13 NOTE — Op Note (Signed)
Maureen Lewis, INSCORE MEDICAL RECORD NO: [id number] ACCOUNT NO: [id number] DATE OF BIRTH: 11-26-1995 FACILITY: [facility] LOCATION: [facility] PHYSICIAN: Vaanya Shambaugh B. Baird Cancer, MD  Operative Report   DATE OF PROCEDURE: 12/13/2020  SURGEON:  Sherlynn Stalls, MD  ANESTHESIA:  General with endotracheal intubation.  PREOPERATIVE DIAGNOSIS:  Retinal detachment, right eye with lattice degeneration of the left eye.  POSTOPERATIVE DIAGNOSIS:  Retinal detachment, right eye with lattice degeneration of the left eye.  OPERATION:  Scleral buckle procedure with drainage of subretinal fluid in the right eye and laser demarcation, prophylaxis of retinal detachment in the left eye.  COMPLICATIONS:  None.  FINDINGS:  There was a macula on retinal detachment with atrophic holes in the right eye and there were atrophic holes without detachment in the left eye.  DESCRIPTION OF PROCEDURE:  The patient was identified in the preoperative holding area where a consent form was placed on the chart for the above procedures.  The right eye was marked as this was the surgical eye.  The patient was taken to the operating  room where she was sedated and placed under general anesthesia by the Anesthesia team.  Once she was stable by anesthesia the right eye was anesthetized using a retrobulbar block consisting of a 1:1 mixture of 0.75% bupivacaine and 1% lidocaine using a  retrobulbar needle.  This was advanced into the retrobulbar space.  The anesthetic was injected in the bulb.  The needle was removed without difficulty.  Next, the right eye was prepped and draped in the usual sterile fashion for ocular surgery.  A  Lieberman speculum was placed between the right upper and lower eyelids for exposure.  A Westcott scissors and 0.3 forceps were used to perform a 360-degree conjunctival peritomy.  Next Stevens tenotomy scissors were used to dissect Tenon's capsule off  the sclera in all 4 quadrants.  The 4 recti muscles were then  isolated and looped using 2-0 silk sutures.  The eye was then inspected with scleral depression.  Retinal detachment was seen in the inferior temporal quadrant.  The retinal holes were  identified and marked using a scleral depression with an exterior marking pen.  Next confluent cryotherapy was placed along all areas of lattice degeneration and atrophic holes.  Next, the eye was prepared for encircling by placing 5-0 nylon sutures in a  horizontal mattress fashion in each of the 4 oblique quadrants.  The bed of the suture was approximately 4.5 mm and used to support #42 scleral buckle.  Next, the eye was encircled with a #42 scleral buckle and joined with the sleeve in the inferonasal  quadrant.  Next, the eye was inspected with scleral depression.  A 25-gauge needle was then guided using indirect ophthalmoscopy into the subretinal space in the inferior temporal quadrant.  This allowed drainage of the retinal detachment without  perforation.  The needle was withdrawn.  Once the retinal detachment was flattened and the buckle height was adjusted.  Excess buckle material was tightened, trimmed and discarded.  Next, the 5-0 nylon sutures were tied, knots rotated posteriorly and  trimmed.  At this point in time, I was well supported.  The 2-0 silk sutures were removed from the muscle insertions.  Once this was completed, 7-0 Vicryl suture was used to reapproximate the Tenon's capsule and conjunctiva to the limbus.  After this,  the eye was treated with subconjunctival injections of 15 mg Ancef and 1 mg of dexamethasone.  The speculum was removed.  The eyelids were cleaned  and closed.  They were then patched and shielded.  The patient was also treated with topical atropine and  TobraDex ointment prior to patching the eye.  Next, attention was directed towards the left eye.  Speculum was placed between the left upper and lower eyelids.  The eye was inspected with scleral depression.  Indirect laser ophthalmoscopy  was used to  treat atrophic holes in the inferior temporal quadrant without difficulty.  Speculum was removed and the patient was then awoken from general anesthesia in excellent condition, was taken to recovery room having tolerated the procedure very well.   PUS D: 12/13/2020 10:37:27 am T: 12/13/2020 1:42:00 pm  JOB: [id number]/ [id number]

## 2020-12-14 ENCOUNTER — Encounter (HOSPITAL_COMMUNITY): Payer: Self-pay | Admitting: Ophthalmology

## 2020-12-17 ENCOUNTER — Ambulatory Visit: Payer: Medicaid Other | Admitting: Physical Therapy

## 2020-12-18 DIAGNOSIS — M542 Cervicalgia: Secondary | ICD-10-CM | POA: Diagnosis not present

## 2020-12-18 DIAGNOSIS — M419 Scoliosis, unspecified: Secondary | ICD-10-CM | POA: Diagnosis not present

## 2020-12-18 DIAGNOSIS — Z79899 Other long term (current) drug therapy: Secondary | ICD-10-CM | POA: Diagnosis not present

## 2020-12-18 DIAGNOSIS — M25571 Pain in right ankle and joints of right foot: Secondary | ICD-10-CM | POA: Diagnosis not present

## 2020-12-18 DIAGNOSIS — N915 Oligomenorrhea, unspecified: Secondary | ICD-10-CM | POA: Diagnosis not present

## 2020-12-18 DIAGNOSIS — M545 Low back pain, unspecified: Secondary | ICD-10-CM | POA: Diagnosis not present

## 2020-12-19 ENCOUNTER — Ambulatory Visit: Payer: Medicaid Other

## 2020-12-19 ENCOUNTER — Ambulatory Visit: Payer: Medicaid Other | Admitting: Obstetrics and Gynecology

## 2020-12-24 ENCOUNTER — Ambulatory Visit: Payer: Medicaid Other | Admitting: Physical Therapy

## 2020-12-24 ENCOUNTER — Telehealth: Payer: Self-pay | Admitting: Physical Therapy

## 2020-12-24 NOTE — Telephone Encounter (Signed)
LVM regarding missed appointment today. I reviewed when her next scheduled appointment is and if she cannot make it to please call ahead of her scheduled time and we can cancel that appointment for her.    Ileene Allie PT, DPT, LAT, ATC  12/24/20  4:13 PM

## 2020-12-24 NOTE — Progress Notes (Deleted)
[name] is a 25 y.o. female who presents for levator trigger point injection, series #1.   There were no vitals filed for this visit.   Indication(s): ***Levator spasm.   Informed Consent:  The alternatives, risks and benefits of the procedure were explained to the patient. Risks including, but not limited to discomfort, pain, bleeding, infection, injury to nearby structures, inability to perform the procedure, failure of the procedure were discussed.  All questions were answered and the patient elected to proceed.  Procedure:   The patient was positioned in dorsal lithotomy position.  The vaginal tissues were prepped with ***Hibiclens solution.  An injection of ***10cc ***20cc of a mixture of 90% anesthetic (***0.25% Bupivacaine) and 10% 40mg /ml Triamcinolone acetomide (Kenalog) was performed in {Single/Multiple:20317} in the {Right/left:16020} levator muscles, a total of *** injection sites. *** Pressure was held over bleeding areas until good hemostasis was achieved.   The patient tolerated the procedure well with no apparent complications.  Jaquita Folds, MD

## 2020-12-25 ENCOUNTER — Encounter: Payer: Self-pay | Admitting: Adult Health

## 2020-12-25 ENCOUNTER — Ambulatory Visit: Payer: Medicaid Other | Admitting: Adult Health

## 2020-12-25 VITALS — BP 132/78 | HR 99 | Ht 74.0 in | Wt >= 6400 oz

## 2020-12-25 DIAGNOSIS — G4733 Obstructive sleep apnea (adult) (pediatric): Secondary | ICD-10-CM

## 2020-12-25 DIAGNOSIS — R519 Headache, unspecified: Secondary | ICD-10-CM

## 2020-12-25 NOTE — Patient Instructions (Signed)
Your Plan:  Continue Topamax 50 mg at bedtime Will call about CPAP      Thank you for coming to see [name] at [facility]. I hope we have been able to provide you high quality care today.  You may receive a patient satisfaction survey over the next few weeks. We would appreciate your feedback and comments so that we may continue to improve ourselves and the health of our patients.

## 2020-12-25 NOTE — Progress Notes (Signed)
PATIENT: Maureen Lewis DOB: [birth year]  REASON FOR VISIT: follow up HISTORY FROM: patient  HISTORY OF PRESENT ILLNESS: Today 12/25/20:  Maureen Lewis is a 25 year old female with a history of concussion and obstructive sleep apnea.  She returns today for follow-up.  She recently had surgery for detached retina.  Reports that initially her vision was completely black but she is slowly starting to see again.  She reports that her headaches have been consistent.  She has not been able to get Therapy.  Reports that her DME company keeps on her is because of the backlog of CPAP machines.  She reports that her sports medicine doctor recently put her on nortriptyline.  She remains on Topamax 50 mg twice a day prescribed by Dr. Brett Fairy.  HISTORY The patient had undergone pseudotumor evaluation with an LP CSF opening pressure of only 16 cm water- deemed normal. Mr. Nuzzo reports that she fell on 07-10-20 when she slipped in a puddle outside her house and hit her head on the back. But already having headache and worsening headaches as known since spring of this year the headaches became more intense and more frequent to a daily headache since his fall and traumatic injury. She has been treated for concussion and postconcussion syndrome which had included blurry vision, vertigo insomnia and pain behind the eye. She has been seen by a sports medicine physician who wanted to be seen her to be seen here to make sure that this is not a neurologic additional concern given the concussion and the previous history of pseudotumor cerebri. As mentioned above this time her spinal tap did not show a high pressure she is already working with neuro rehab for vertigo and she is still in the process of getting her CPAP machine renewed. I got a copy of her most recent home sleep test results to the end of today's visit note. She reports still having Vertigo-  There are multiple triggers. She feels a clockwise  rotation and it can be a fast sensation or slower , but a rotation. She has felt insecure when she lifts her baby daughter. Feels off balance.  At the end of the sports medicine referral and PT evaluation with spot drop letter the patient was again diagnosed with vertigo and a postconcussive syndrome after a fall downstairs which actually happened later in September September 21.  The past medical history is positive for obesity, acid reflux disease, alpha thalassemia trait, anemia related, back pain related to 1, history of depression, gestational diabetes, headaches in the setting of pseudotumor cerebri or benign intracranial hypertension, hypertension and generalized OSA.  Recommended was also ophthalmology visit.   REVIEW OF SYSTEMS: Out of a complete 14 system review of symptoms, the patient complains only of the following symptoms, and all other reviewed systems are negative.  ALLERGIES: Allergies  Allergen Reactions  . Bee Pollen Hives, Shortness Of Breath and Swelling  . Bee Venom Hives, Shortness Of Breath and Swelling  . Hydrocodone-Acetaminophen Anaphylaxis    No problem when she takes Tylenol  . Peach Flavor Hives, Shortness Of Breath and Other (See Comments)    SWELLING OF MOUTH  . Pollen Extract Hives, Shortness Of Breath and Swelling  . Remdesivir Swelling    Angioedema 8/25    HOME MEDICATIONS: Outpatient Medications Prior to Visit  Medication Sig Dispense Refill  . acetaZOLAMIDE (DIAMOX) 500 MG capsule Take 1 capsule (500 mg total) by mouth 2 (two) times daily. For headache 60 capsule 2  .  albuterol (VENTOLIN HFA) 108 (90 Base) MCG/ACT inhaler Inhale 2 puffs into the lungs every 6 (six) hours as needed for wheezing or shortness of breath. 8 g 0  . amLODipine (NORVASC) 10 MG tablet Take 1 tablet (10 mg total) by mouth daily. 90 tablet 3  . baclofen (LIORESAL) 10 MG tablet Take 1 tablet (10 mg total) by mouth 2 (two) times daily. 30 each 0  . diclofenac (VOLTAREN) 75 MG EC  tablet Take 1 tablet (75 mg total) by mouth 2 (two) times daily. 60 tablet 1  . ferrous sulfate 325 (65 FE) MG tablet Take 1 tablet (325 mg total) by mouth every other day. (Patient taking differently: Take 325 mg by mouth every Monday, Wednesday, and Friday.) 45 tablet 1  . gabapentin (NEURONTIN) 100 MG capsule Take 100 mg by mouth at bedtime.    . hydrochlorothiazide (HYDRODIURIL) 25 MG tablet Take 1 tablet (25 mg total) by mouth daily. 90 tablet 3  . ibuprofen (ADVIL) 800 MG tablet Take 800 mg by mouth every 8 (eight) hours as needed.    . loratadine (CLARITIN) 10 MG tablet Take 10 mg by mouth daily as needed for allergies.     Marland Kitchen ofloxacin (OCUFLOX) 0.3 % ophthalmic solution Place 1 drop into the right eye in the morning, at noon, in the evening, and at bedtime.    Marland Kitchen oxyCODONE-acetaminophen (PERCOCET/ROXICET) 5-325 MG tablet Take 1-2 tablets by mouth every 6 (six) hours as needed for severe pain. (Patient taking differently: Take 1 tablet by mouth 5 (five) times daily.) 20 tablet 0  . polyethylene glycol powder (GLYCOLAX/MIRALAX) 17 GM/SCOOP powder Take 17 g by mouth daily as needed for moderate constipation.     . prednisoLONE acetate (PRED FORTE) 1 % ophthalmic suspension Place 1 drop into the right eye in the morning, at noon, in the evening, and at bedtime.    Marland Kitchen tobramycin-dexamethasone (TOBRADEX) ophthalmic ointment Place 1 application into the right eye 2 (two) times daily.    Marland Kitchen topiramate (TOPAMAX) 25 MG tablet Take 2 tablets (50 mg total) by mouth 2 (two) times daily. 180 tablet 1  . Vitamin D, Ergocalciferol, (DRISDOL) 1.25 MG (50000 UNIT) CAPS capsule Take 1 capsule (50,000 Units total) by mouth every 7 (seven) days. (Patient taking differently: Take 50,000 Units by mouth every Monday.) 4 capsule 0   No facility-administered medications prior to visit.    PAST MEDICAL HISTORY: Past Medical History:  Diagnosis Date  . Abnormal uterine bleeding   . Acid reflux   . Alpha thalassemia  trait   . Amenorrhea   . Anemia    no current med.  . Back pain   . Cesarean delivery delivered 10/11/2019   10/09/2019 - primary CS for failed IOL  . Chest pain    resolved  . Complication of anesthesia    states had to keep giving her anesthesia during EGD  . Constipation   . Depression    "I'm good"  . Fibromyalgia   . Finger mass, left 03/2017   middle finger  . Gestational diabetes 05/23/2019   hx - with pregnancy only   . Gestational hypertension 09/23/2019   Guidelines for Antenatal Testing and Sonography  (with updated ICD-10 codes)  Updated  10-02-2019 with Dr. Tama High  INDICATION U/S 2 X week NST/AFI  or full BPP wkly DELIVERY Diabetes   A1 - good control - O24.410    A2 - good control - O24.419      A2  -  poor control or poor compliance - O24.419, E11.65   (Macrosomia or polyhydramnios) **E11.65 is extra code for poor control**    A2/B - O24.  Marland Kitchen Headache   . Hypertension   . Infection    UTI  . Irritable bowel syndrome (IBS)   . Joint pain   . Morbid obesity with body mass index (BMI) of 45.0 to 49.9 in adult [facility])   . Obesity during pregnancy, antepartum 04/06/2019   Body mass index is 53.71 kg/m.  Recommendations [x]  Aspirin 81 mg daily after 12 weeks; discontinue after 36 weeks [ ]  Nutrition consult [ ]  Weight gain 11-20 lbs for singleton and 25-35 lbs for twin pregnancy (IOM guidelines) Higher class of obesity patients recommended to gain closer to lower limit  Weight loss is associated with adverse outcomes [ ]  Baseline and surveillance labs (pulled in fr  . Plantar fasciitis, bilateral    resolved  . Polycystic ovary syndrome   . Pre-diabetes    no meds, diet controlled, diet not check blood sugar  . Prediabetes    "prediabetes"  . Pregnancy induced hypertension   . Shortness of breath    albuterol inhaler  . Sleep apnea    no CPAP use  . Supervision of normal first pregnancy 04/06/2019   [name]: [ x]Initial [ x]12 [ ] 20 [ ] 28 [ ] 32 [ ] 36 [  ]38 [ ] 39 [ ] 40 Nursing Staff Provider Office Location [facility]  Dating   LMP Language  English  Anatomy US  Nml Flu Vaccine  Declined 07/28/19 Genetic Screen  NIPS: low risks   AFP:   negative  TDaP vaccine   08/25/19 Hgb A1C or  GTT Early A1C 5.8 Third trimester: GDM insulin Rhogam  NA   LAB RESULTS  Feeding Plan Breast Blood Type  . Vertigo 2017    PAST SURGICAL HISTORY: Past Surgical History:  Procedure Laterality Date  . CESAREAN SECTION N/A 10/09/2019   Procedure: CESAREAN SECTION;  Surgeon: Aletha Halim, MD;  Location: [facility];  Service: Obstetrics;  Laterality: N/A;  . COLONOSCOPY WITH PROPOFOL  10/07/2016  . DILATION AND CURETTAGE OF UTERUS    . ESOPHAGOGASTRODUODENOSCOPY  12/21/2015  . EXCISION MASS UPPER EXTREMETIES Left 04/21/2017   Procedure: EXCISION MASS LEFT MIDDLE FINGER;  Surgeon: Daryll Brod, MD;  Location: [employer];  Service: Orthopedics;  Laterality: Left;  . IR FL GUIDED LOC OF NEEDLE/CATH TIP FOR SPINAL INJECTION RT  03/13/2020  . PHOTOCOAGULATION WITH LASER Left 12/13/2020   Procedure: RETINOPLEXY LEFT EYE;  Surgeon: Sherlynn Stalls, MD;  Location: [place];  Service: Ophthalmology;  Laterality: Left;  . SCLERAL BUCKLE Right 12/13/2020   Procedure: SCLERAL BUCKLE WITH CRYO;  Surgeon: Sherlynn Stalls, MD;  Location: [place];  Service: Ophthalmology;  Laterality: Right;    FAMILY HISTORY: Family History  Problem Relation Age of Onset  . Diabetes Maternal Aunt   . Hypertension Maternal Uncle   . Depression Maternal Grandfather   . Diabetes Maternal Grandfather   . Hypertension Maternal Grandfather   . Arthritis Mother   . High blood pressure Mother   . Depression Mother   . Sleep apnea Mother   . Obesity Mother   . Diabetes Mother   . Hypertension Mother   . Kidney failure Mother   . Sleep apnea Father   . Obesity Father   . Healthy Daughter     SOCIAL HISTORY: Social History   Socioeconomic History  . Marital status: Single  Spouse  name: Not on file  . Number of children: Not on file  . Years of education: Not on file  . Highest education level: Not on file  Occupational History  . Occupation: Chemical engineer: [id number]  Tobacco Use  . Smoking status: Never Smoker  . Smokeless tobacco: Never Used  Vaping Use  . Vaping Use: Never used  Substance and Sexual Activity  . Alcohol use: Not Currently    Comment: social   . Drug use: No  . Sexual activity: Yes    Birth control/protection: Condom  Other Topics Concern  . Not on file  Social History Narrative   Right handed    Soda sometimes   Lives with mom and cousin, a grandmother in [place] also helps care for her.       She is in nursing school.    Social Determinants of Health   Financial Resource Strain: Not on file  Food Insecurity: Not on file  Transportation Needs: Not on file  Physical Activity: Not on file  Stress: Not on file  Social Connections: Not on file  Intimate Partner Violence: Not on file      PHYSICAL EXAM  There were no vitals filed for this visit. There is no height or weight on file to calculate BMI.  Generalized: Well developed, in no acute distress   Neurological examination  Mentation: Alert oriented to time, place, history taking. Follows all commands speech and language fluent Cranial nerve II-XII: Pupils were equal round reactive to light. Extraocular movements were full, visual field were full on confrontational test. Facial sensation and strength were normal. Uvula tongue midline. Head turning and shoulder shrug  were normal and symmetric. Motor: The motor testing reveals 5 over 5 strength of all 4 extremities. Good symmetric motor tone is noted throughout.  Sensory: Sensory testing is intact to soft touch on all 4 extremities. No evidence of extinction is noted.  Coordination: Cerebellar testing reveals good finger-nose-finger and heel-to-shin bilaterally.  Gait and station: Gait is normal. Tandem gait  is normal. Romberg is negative. No drift is seen.  Reflexes: Deep tendon reflexes are symmetric and normal bilaterally.   DIAGNOSTIC DATA (LABS, IMAGING, TESTING) - I reviewed patient records, labs, notes, testing and imaging myself where available.  Lab Results  Component Value Date   WBC 8.4 12/13/2020   HGB 12.6 12/13/2020   HCT 41.5 12/13/2020   MCV 74.8 (L) 12/13/2020   PLT 269 12/13/2020      Component Value Date/Time   NA 137 12/13/2020 0556   NA 140 04/09/2020 1252   K 4.1 12/13/2020 0556   CL 104 12/13/2020 0556   CO2 23 12/13/2020 0556   GLUCOSE 132 (H) 12/13/2020 0556   BUN 11 12/13/2020 0556   BUN 11 04/09/2020 1252   CREATININE 0.52 12/13/2020 0556   CREATININE 0.71 06/26/2020 1457   CALCIUM 9.0 12/13/2020 0556   PROT 7.6 11/08/2020 1131   PROT 7.9 04/09/2020 1252   ALBUMIN 4.0 11/08/2020 1131   ALBUMIN 4.8 04/09/2020 1252   AST 11 11/08/2020 1131   AST 16 05/16/2020 1429   ALT 14 11/08/2020 1131   ALT 17 05/16/2020 1429   ALKPHOS 88 11/08/2020 1131   BILITOT 0.3 11/08/2020 1131   BILITOT 0.7 05/16/2020 1429   GFRNONAA >60 12/13/2020 0556   GFRNONAA 122 06/26/2020 1457   GFRAA 141 06/26/2020 1457   Lab Results  Component Value Date   CHOL 118 11/08/2020  HDL 36.00 (L) 11/08/2020   LDLCALC 69 11/08/2020   TRIG 64.0 11/08/2020   CHOLHDL 3 11/08/2020   Lab Results  Component Value Date   HGBA1C 6.1 11/08/2020   Lab Results  Component Value Date   VITAMINB12 243 04/09/2020   Lab Results  Component Value Date   TSH 1.240 11/19/2020      ASSESSMENT AND PLAN 25 y.o. year old female  has a past medical history of Abnormal uterine bleeding, Acid reflux, Alpha thalassemia trait, Amenorrhea, Anemia, Back pain, Cesarean delivery delivered (10/11/2019), Chest pain, Complication of anesthesia, Constipation, Depression, Fibromyalgia, Finger mass, left (03/2017), Gestational diabetes (05/23/2019), Gestational hypertension (09/23/2019), Headache,  Hypertension, Infection, Irritable bowel syndrome (IBS), Joint pain, Morbid obesity with body mass index (BMI) of 45.0 to 49.9 in adult [facility]), Obesity during pregnancy, antepartum (04/06/2019), Plantar fasciitis, bilateral, Polycystic ovary syndrome, Pre-diabetes, Prediabetes, Pregnancy induced hypertension, Shortness of breath, Sleep apnea, Supervision of normal first pregnancy (04/06/2019), and Vertigo (2017). here with:  1.  Daily headaches 2.  Obstructive sleep apnea   Advised the patient that she did not need to be on both Diamox and Topamax.  She will wean off Topamax taking 25 mg twice a day for the next 3 to 4 days and stop the medication.  The patient reports that she has not been set up for CPAP.  She states that the DME companies keep telling her that they do not have the equipment-we followed up with DME company and advised patient that she needs to call the [facility] office to schedule an appointment.  She was provided with their phone number.  The patient's headaches could be due to untreated sleep apnea.  She was recently placed on nortriptyline.  For that reason I would not add any additional medication at this time.  Hopefully she can get set up on CPAP and her headaches improve if not we will consider adding on medication at that time   I spent 30 minutes of face-to-face and non-face-to-face time with patient.  This included previsit chart review, lab review, study review, order entry, electronic health record documentation, patient education.  Ward Givens, MSN, NP-C 12/25/2020, 2:38 PM [facility] [address], [place], [place] [address] [phone]

## 2020-12-26 ENCOUNTER — Ambulatory Visit: Payer: Medicaid Other | Admitting: Obstetrics and Gynecology

## 2020-12-26 ENCOUNTER — Ambulatory Visit: Payer: Medicaid Other

## 2020-12-31 ENCOUNTER — Ambulatory Visit (INDEPENDENT_AMBULATORY_CARE_PROVIDER_SITE_OTHER): Payer: Medicaid Other | Admitting: Family Medicine

## 2020-12-31 ENCOUNTER — Encounter (INDEPENDENT_AMBULATORY_CARE_PROVIDER_SITE_OTHER): Payer: Self-pay | Admitting: Family Medicine

## 2020-12-31 ENCOUNTER — Other Ambulatory Visit: Payer: Self-pay

## 2020-12-31 ENCOUNTER — Ambulatory Visit: Payer: Medicaid Other | Admitting: Physical Therapy

## 2020-12-31 VITALS — BP 144/84 | HR 103 | Temp 98.0°F | Ht 74.0 in | Wt >= 6400 oz

## 2020-12-31 DIAGNOSIS — E8881 Metabolic syndrome: Secondary | ICD-10-CM

## 2020-12-31 DIAGNOSIS — E559 Vitamin D deficiency, unspecified: Secondary | ICD-10-CM | POA: Diagnosis not present

## 2020-12-31 DIAGNOSIS — Z6841 Body Mass Index (BMI) 40.0 and over, adult: Secondary | ICD-10-CM | POA: Diagnosis not present

## 2020-12-31 MED ORDER — VICTOZA 18 MG/3ML ~~LOC~~ SOPN: PEN_INJECTOR | SUBCUTANEOUS | 0 refills | Status: DC

## 2020-12-31 MED ORDER — VITAMIN D (ERGOCALCIFEROL) 1.25 MG (50000 UNIT) PO CAPS: 50000.0000 [IU] | ORAL_CAPSULE | ORAL | 0 refills | Status: DC

## 2021-01-02 ENCOUNTER — Ambulatory Visit: Payer: Medicaid Other

## 2021-01-02 ENCOUNTER — Ambulatory Visit: Payer: Medicaid Other | Admitting: Obstetrics and Gynecology

## 2021-01-02 ENCOUNTER — Telehealth: Payer: Self-pay | Admitting: Neurology

## 2021-01-02 ENCOUNTER — Encounter: Payer: Self-pay | Admitting: Neurology

## 2021-01-02 ENCOUNTER — Encounter (INDEPENDENT_AMBULATORY_CARE_PROVIDER_SITE_OTHER): Payer: Self-pay

## 2021-01-02 DIAGNOSIS — G4733 Obstructive sleep apnea (adult) (pediatric): Secondary | ICD-10-CM | POA: Diagnosis not present

## 2021-01-02 NOTE — Telephone Encounter (Signed)
Called pt and set up f/u for her sleep machine. She stated she was given the "full face mask" when given her sleep machine and would like to know if this was the correct thing she is supposed to be using. Informed pt we would send her a MyChart message to advise.

## 2021-01-02 NOTE — Progress Notes (Signed)
Chief Complaint:   OBESITY Maureen Lewis is here to discuss her progress with her obesity treatment plan along with follow-up of her obesity related diagnoses. Maureen Lewis is on the Category 3 Plan and states she is following her eating plan approximately 75% of the time. Maureen Lewis states she is walking 30 minutes 3-4 times per week.  Today's visit was #: 7 Starting weight: 437 lbs Starting date: 04/09/2020 Today's weight: 479 lbs Today's date: 12/31/2020 Total lbs lost to date: 0 Total lbs lost since last in-office visit: 0  Interim History: Pt is feeling like she followed plan fairly strictly and is feeling frustrated with the lack of weight loss. Breakfast- 2 eggs, kielbasa link sausage, and toast; Lunch- microwave meal and fruit and water; Dinner- protein, vegetable, fruit, and water (getting in full 8-10 oz); doing veggie chips, yogurt, or fruit for snacks.  Subjective:   1. Metabolic syndrome Pt is trying to stay compliant to category 3 meal plan.  2. Vitamin D deficiency Pt denies nausea, vomiting, and muscle weakness but notes fatigue. Her last Vit D level was 12.1 on 04/09/2020.  Assessment/Plan:   1. Metabolic syndrome Start Victoza 0.6 mg, as per below.  - liraglutide (VICTOZA) 18 MG/3ML SOPN; Start 0.6mg  SQ once a day for 7 days, then increase to 1.2mg  once a day  Dispense: 6 mL; Refill: 0  2. Vitamin D deficiency Low Vitamin D level contributes to fatigue and are associated with obesity, breast, and colon cancer. She agrees to continue to take prescription Vitamin D @50 ,000 IU every week and will follow-up for routine testing of Vitamin D, at least 2-3 times per year to avoid over-replacement.  - Vitamin D, Ergocalciferol, (DRISDOL) 1.25 MG (50000 UNIT) CAPS capsule; Take 1 capsule (50,000 Units total) by mouth every 7 (seven) days.  Dispense: 4 capsule; Refill: 0  3. Class 3 severe obesity with serious comorbidity and body mass index (BMI) of 60.0 to 69.9 in adult, unspecified  obesity type (HCC) Maureen Lewis is currently in the action stage of change. As such, her goal is to continue with weight loss efforts. She has agreed to the Category 3 Plan.   Exercise goals: All adults should avoid inactivity. Some physical activity is better than none, and adults who participate in any amount of physical activity gain some health benefits.  Behavioral modification strategies: increasing lean protein intake, meal planning and cooking strategies, keeping healthy foods in the home and planning for success.  Maureen Lewis has agreed to follow-up with our clinic in 2 weeks. She was informed of the importance of frequent follow-up visits to maximize her success with intensive lifestyle modifications for her multiple health conditions.   Objective:   Blood pressure (!) 144/84, pulse (!) 103, temperature 98 F (36.7 C), temperature source Oral, height 6\' 2"  (1.88 m), weight (!) 479 lb (217.3 kg), last menstrual period 10/04/2020, SpO2 96 %, not currently breastfeeding. Body mass index is 61.5 kg/m.  General: Cooperative, alert, well developed, in no acute distress. HEENT: Conjunctivae and lids unremarkable. Cardiovascular: Regular rhythm.  Lungs: Normal work of breathing. Neurologic: No focal deficits.   Lab Results  Component Value Date   CREATININE 0.52 12/13/2020   BUN 11 12/13/2020   NA 137 12/13/2020   K 4.1 12/13/2020   CL 104 12/13/2020   CO2 23 12/13/2020   Lab Results  Component Value Date   ALT 14 11/08/2020   AST 11 11/08/2020   ALKPHOS 88 11/08/2020   BILITOT 0.3 11/08/2020  Lab Results  Component Value Date   HGBA1C 6.1 11/08/2020   HGBA1C 6.0 (H) 04/09/2020   HGBA1C 5.8 (H) 04/06/2019   HGBA1C 6.0 04/27/2018   HGBA1C 5.8 (H) 08/13/2017   Lab Results  Component Value Date   INSULIN 36.8 (H) 04/09/2020   INSULIN 42.1 (H) 08/13/2017   Lab Results  Component Value Date   TSH 1.240 11/19/2020   Lab Results  Component Value Date   CHOL 118 11/08/2020    HDL 36.00 (L) 11/08/2020   LDLCALC 69 11/08/2020   TRIG 64.0 11/08/2020   CHOLHDL 3 11/08/2020   Lab Results  Component Value Date   WBC 8.4 12/13/2020   HGB 12.6 12/13/2020   HCT 41.5 12/13/2020   MCV 74.8 (L) 12/13/2020   PLT 269 12/13/2020   Lab Results  Component Value Date   IRON 36 (L) 05/16/2020   TIBC 475 (H) 05/16/2020   FERRITIN 92 06/15/2020    Attestation Statements:   Reviewed by clinician on day of visit: allergies, medications, problem list, medical history, surgical history, family history, social history, and previous encounter notes.  Coral Ceo, am acting as transcriptionist for Coralie Common, MD.   I have reviewed the above documentation for accuracy and completeness, and I agree with the above. - Jinny Blossom, MD

## 2021-01-02 NOTE — Telephone Encounter (Signed)
Sent pt a Therapist, music. She can discuss with DME about mask options that best feel comfortable with her

## 2021-01-03 ENCOUNTER — Encounter: Payer: Self-pay | Admitting: Obstetrics and Gynecology

## 2021-01-03 ENCOUNTER — Other Ambulatory Visit (HOSPITAL_COMMUNITY)
Admission: RE | Admit: 2021-01-03 | Discharge: 2021-01-03 | Disposition: A | Discharge status: Home / Self Care | Payer: Medicaid Other | Source: Ambulatory Visit | Attending: Obstetrics and Gynecology | Admitting: Obstetrics and Gynecology

## 2021-01-03 ENCOUNTER — Other Ambulatory Visit: Payer: Self-pay

## 2021-01-03 ENCOUNTER — Ambulatory Visit (INDEPENDENT_AMBULATORY_CARE_PROVIDER_SITE_OTHER): Payer: Medicaid Other | Admitting: Obstetrics and Gynecology

## 2021-01-03 VITALS — Ht 74.5 in | Wt >= 6400 oz

## 2021-01-03 DIAGNOSIS — G8929 Other chronic pain: Secondary | ICD-10-CM | POA: Insufficient documentation

## 2021-01-03 DIAGNOSIS — R102 Pelvic and perineal pain: Secondary | ICD-10-CM

## 2021-01-03 NOTE — Progress Notes (Signed)
GYN presents for abdominal and pelvic pain 9/10 7+ months. She takes oxyCodene but it does not work.  Denies vaginal discharge, odor, itching, burning or dysuria or constipation.  Last PAP 04/11/2020

## 2021-01-03 NOTE — Progress Notes (Signed)
25 yo P1 here for the evaluation of chronic pelvic pain. Patient describes a Latanga Nedrow stabbing pain which starts in her LLQ and sometimes radiates to her vagina. Patient states the pain has been present since her c-section in 09/2019. The pain is present all the time and is not associated with any particular activities, intercourse or menses. Patient states oxycodone takes the edge of the pain but nothing makes it go away completely.  Past Medical History:  Diagnosis Date  . Abnormal uterine bleeding   . Acid reflux   . Alpha thalassemia trait   . Amenorrhea   . Anemia    no current med.  . Back pain   . Cesarean delivery delivered 10/11/2019   10/09/2019 - primary CS for failed IOL  . Chest pain    resolved  . Complication of anesthesia    states had to keep giving her anesthesia during EGD  . Constipation   . Depression    "I'm good"  . Fibromyalgia   . Finger mass, left 03/2017   middle finger  . Gestational diabetes 05/23/2019   hx - with pregnancy only   . Gestational hypertension 09/23/2019   Guidelines for Antenatal Testing and Sonography  (with updated ICD-10 codes)  Updated  29-Sep-2019 with Dr. Tama High  INDICATION U/S 2 X week NST/AFI  or full BPP wkly DELIVERY Diabetes   A1 - good control - O24.410    A2 - good control - O24.419      A2  - poor control or poor compliance - O24.419, E11.65   (Macrosomia or polyhydramnios) **E11.65 is extra code for poor control**    A2/B - O24.  Marland Kitchen Headache   . Hypertension   . Infection    UTI  . Irritable bowel syndrome (IBS)   . Joint pain   . Morbid obesity with body mass index (BMI) of 45.0 to 49.9 in adult [facility])   . Obesity during pregnancy, antepartum 04/06/2019   Body mass index is 53.71 kg/m.  Recommendations [x]  Aspirin 81 mg daily after 12 weeks; discontinue after 36 weeks [ ]  Nutrition consult [ ]  Weight gain 11-20 lbs for singleton and 25-35 lbs for twin pregnancy (IOM guidelines) Higher class of obesity patients recommended  to gain closer to lower limit  Weight loss is associated with adverse outcomes [ ]  Baseline and surveillance labs (pulled in fr  . Plantar fasciitis, bilateral    resolved  . Polycystic ovary syndrome   . Pre-diabetes    no meds, diet controlled, diet not check blood sugar  . Prediabetes    "prediabetes"  . Pregnancy induced hypertension   . Shortness of breath    albuterol inhaler  . Sleep apnea    no CPAP use  . Supervision of normal first pregnancy 04/06/2019   [name]: [ x]Initial [ x]12 [ ] 20 [ ] 28 [ ] 32 [ ] 36 [ ] 38 [ ] 39 [ ] 40 Nursing Staff Provider Office Location [facility]  Dating   LMP Language  English  Anatomy US  Nml Flu Vaccine  Declined 07/28/19 Genetic Screen  NIPS: low risks   AFP:   negative  TDaP vaccine   08/25/19 Hgb A1C or  GTT Early A1C 5.8 Third trimester: GDM insulin Rhogam  NA   LAB RESULTS  Feeding Plan Breast Blood Type  . Vertigo 2017   Past Surgical History:  Procedure Laterality Date  . CESAREAN SECTION N/A 10/09/2019   Procedure: CESAREAN SECTION;  Surgeon: Aletha Halim, MD;  Location:  [facility] LD [facility]S;  Service: Obstetrics;  Laterality: N/A;  . COLONOSCOPY WITH PROPOFOL  10/07/2016  . DILATION AND CURETTAGE OF UTERUS    . ESOPHAGOGASTRODUODENOSCOPY  12/21/2015  . EXCISION MASS UPPER EXTREMETIES Left 04/21/2017   Procedure: EXCISION MASS LEFT MIDDLE FINGER;  Surgeon: Daryll Brod, MD;  Location: [place];  Service: Orthopedics;  Laterality: Left;  . IR FL GUIDED LOC OF NEEDLE/CATH TIP FOR SPINAL INJECTION RT  03/13/2020  . PHOTOCOAGULATION WITH LASER Left 12/13/2020   Procedure: RETINOPLEXY LEFT EYE;  Surgeon: Sherlynn Stalls, MD;  Location: [place];  Service: Ophthalmology;  Laterality: Left;  . SCLERAL BUCKLE Right 12/13/2020   Procedure: SCLERAL BUCKLE WITH CRYO;  Surgeon: Sherlynn Stalls, MD;  Location: [place];  Service: Ophthalmology;  Laterality: Right;   Family History  Problem Relation Age of Onset  . Diabetes Maternal Aunt   .  Hypertension Maternal Uncle   . Depression Maternal Grandfather   . Diabetes Maternal Grandfather   . Hypertension Maternal Grandfather   . Arthritis Mother   . High blood pressure Mother   . Depression Mother   . Sleep apnea Mother   . Obesity Mother   . Diabetes Mother   . Hypertension Mother   . Kidney failure Mother   . Sleep apnea Father   . Obesity Father   . Healthy Daughter    Social History   Tobacco Use  . Smoking status: Never Smoker  . Smokeless tobacco: Never Used  Vaping Use  . Vaping Use: Never used  Substance Use Topics  . Alcohol use: Not Currently    Comment: social   . Drug use: No   ROS See pertinent in HPI. All other systems reviewed and non contributory  Height 6' 2.5" (1.892 m), weight (!) 488 lb (221.4 kg), last menstrual period 10/04/2020, not currently breastfeeding. GENERAL: Well-developed, well-nourished female in no acute distress.  ABDOMEN: Soft, nontender, nondistended. No organomegaly. PELVIC: Normal external female genitalia. Vagina is pink and rugated.  Normal discharge. Normal appearing cervix. Bimanual limited secondary to body habitus EXTREMITIES: No cyanosis, clubbing, or edema, 2+ distal pulses.  A/P 25 yo with chronic pelvic pain - vaginal swab collected - Pelvic ultrasound ordered - Patient will be contacted with abnormal results - Also discussed referral to physical therapy if all results are normal - Patient with normal pap smear in 2021

## 2021-01-04 LAB — CERVICOVAGINAL ANCILLARY ONLY
Bacterial Vaginitis (gardnerella): NEGATIVE
Candida Glabrata: NEGATIVE
Candida Vaginitis: NEGATIVE
Chlamydia: NEGATIVE
Comment: NEGATIVE
Comment: NEGATIVE
Comment: NEGATIVE
Comment: NEGATIVE
Comment: NEGATIVE
Comment: NORMAL
Neisseria Gonorrhea: NEGATIVE
Trichomonas: NEGATIVE

## 2021-01-07 ENCOUNTER — Ambulatory Visit: Payer: Medicaid Other | Attending: Adult Health | Admitting: Physical Therapy

## 2021-01-07 ENCOUNTER — Telehealth: Payer: Self-pay | Admitting: Physical Therapy

## 2021-01-07 NOTE — Telephone Encounter (Signed)
LVM regarding missed appointment today and that today marks her second missed appointment today. Noted she only has 1 more scheduled visit and if she misses  It, it will mark 3 consecutive missed appointments which is grounds for discharge. Please review the folder provided on evaluation. I stated when her next scheduled appointment is and if she cannot make it to call before her appointment and we can cancel or reschedule that appointment for her.   Rajanee Schuelke PT, DPT, LAT, ATC  01/07/21  5:58 PM

## 2021-01-09 ENCOUNTER — Other Ambulatory Visit: Payer: Self-pay

## 2021-01-09 ENCOUNTER — Ambulatory Visit (INDEPENDENT_AMBULATORY_CARE_PROVIDER_SITE_OTHER): Payer: Medicaid Other | Admitting: Family Medicine

## 2021-01-09 ENCOUNTER — Ambulatory Visit (INDEPENDENT_AMBULATORY_CARE_PROVIDER_SITE_OTHER): Payer: Medicaid Other

## 2021-01-09 ENCOUNTER — Telehealth: Payer: Self-pay

## 2021-01-09 ENCOUNTER — Ambulatory Visit: Payer: Medicaid Other

## 2021-01-09 ENCOUNTER — Encounter: Payer: Self-pay | Admitting: Family Medicine

## 2021-01-09 ENCOUNTER — Ambulatory Visit: Payer: Medicaid Other | Admitting: Obstetrics and Gynecology

## 2021-01-09 VITALS — BP 130/90 | HR 109 | Ht 74.5 in

## 2021-01-09 DIAGNOSIS — M542 Cervicalgia: Secondary | ICD-10-CM

## 2021-01-09 DIAGNOSIS — M549 Dorsalgia, unspecified: Secondary | ICD-10-CM

## 2021-01-09 DIAGNOSIS — M546 Pain in thoracic spine: Secondary | ICD-10-CM | POA: Diagnosis not present

## 2021-01-09 DIAGNOSIS — S060X0A Concussion without loss of consciousness, initial encounter: Secondary | ICD-10-CM

## 2021-01-09 DIAGNOSIS — M545 Low back pain, unspecified: Secondary | ICD-10-CM | POA: Diagnosis not present

## 2021-01-09 MED ORDER — TIZANIDINE HCL 4 MG PO TABS: 4.0000 mg | ORAL_TABLET | Freq: Four times a day (QID) | ORAL | 2 refills | Status: DC | PRN

## 2021-01-09 NOTE — Progress Notes (Signed)
Rito Ehrlich, am serving as a Education administrator for Dr. [name].  Maureen Lewis is a 25 y.o. female who presents to [place] at [facility] today for back, foot, and neck pain that started today when she suffered a fall this morning. Patient thinks she missed a step or the steps were slippery but fell from the top step down seven steps. Patient does not remember falling thinks she hit her head and passed out for a bit. Patient was holding her baby when she fell so she took the fall without putting any hands down. Pt was previously seen by Dr. Georgina Snell on 11/14/20 for a R ankle fx. Today, pt locates pain to Left side of neck radiates up the back of the head, pain also on top of the head. Patient is experiencing back pain from the middle down is feeling pain radiating down her R leg to her knee.   Dx imaging: 09/07/20 R ankle MRI  08/07/20 R ankle XR             07/10/20 R ankle XR Pertinent review of systems: No fevers or chills  Relevant historical information: Pseudotumor cerebri.  Morbid obesity.   Exam:  BP 130/90 (BP Location: Left Arm, Patient Position: Sitting, Cuff Size: Large)   Pulse (!) 109   Ht 6' 2.5" (1.892 m)   LMP 01/02/2021   SpO2 97%   BMI 61.82 kg/m  General: Well Developed, well nourished, and in no acute distress.  Neuro: Alert and oriented normal coordination.  Reflexes are intact.  Normal gait.  Strength intact in bilateral upper and lower extremities. MSK: C-spine normal-appearing Nontender midline.  Tender palpation left cervical paraspinal musculature.  Depression reflexes and strength are intact throughout. T-spine normal-appearing Nontender midline.  Tender palpation thoracic paraspinal muscle musculature L-spine normal-appearing nontender midline.  Tender palpation lumbar paraspinal musculature.  Exhalatory strength reflexes and sensation are intact throughout.    Lab and Radiology Results X-ray images C-spine T-spine L-spine  obtained today personally and independently interpreted  C-spine: Loss of cervical lordosis.  No acute fractures visible.  T-spine: Inadequate penetration on lateral images.  No visible fractures present.  L-spine: No fractures visible.  Mild degenerative changes present.  Await formal radiology review    Assessment and Plan: 25 y.o. female with fall occurring today.     Should likely suffered a concussion but doubtful for more serious head injury.  Discussed precautions including precautions to go to the emergency room such as worsening headache or loss of consciousness.  Additionally she has significant muscle dysfunction and spasm of her cervical spine and lumbar spine.  Plan for tizanidine existing pain medication heating pad relative rest and recheck in 1 week.  If worsening can return sooner or present to the emergency room.  Again precautions reviewed.   PDMP not reviewed this encounter. Orders Placed This Encounter  Procedures  . DG Cervical Spine Complete    Standing Status:   Future    Number of Occurrences:   1    Standing Expiration Date:   01/09/2022    Order Specific Question:   Reason for Exam (SYMPTOM  OR DIAGNOSIS REQUIRED)    Answer:   Neck pain    Order Specific Question:   Is patient pregnant?    Answer:   No    Order Specific Question:   Preferred imaging location?    Answer:   [facility]  . DG Lumbar Spine 2-3 Views  Standing Status:   Future    Number of Occurrences:   1    Standing Expiration Date:   01/09/2022    Order Specific Question:   Reason for Exam (SYMPTOM  OR DIAGNOSIS REQUIRED)    Answer:   Back pain    Order Specific Question:   Is patient pregnant?    Answer:   No    Order Specific Question:   Preferred imaging location?    Answer:   [facility]  . DG Thoracic Spine 2 View    Standing Status:   Future    Number of Occurrences:   1    Standing Expiration Date:   01/09/2022    Order Specific Question:   Reason for Exam  (SYMPTOM  OR DIAGNOSIS REQUIRED)    Answer:   Back pain    Order Specific Question:   Is patient pregnant?    Answer:   No    Order Specific Question:   Preferred imaging location?    Answer:   [facility]   Meds ordered this encounter  Medications  . tiZANidine (ZANAFLEX) 4 MG tablet    Sig: Take 1 tablet (4 mg total) by mouth every 6 (six) hours as needed for muscle spasms.    Dispense:  30 tablet    Refill:  2     Discussed warning signs or symptoms. Please see discharge instructions. Patient expresses understanding.   The above documentation has been reviewed and is accurate and complete [name], M.D.

## 2021-01-09 NOTE — Telephone Encounter (Signed)
Pt did not show for today's PT appt. This was the pt's last scheduled appt. Attempted phone call to contact pt, but there was no answer or the ability to leave a message. Will DC the pt from PT services in 1 month if she has not contacted the clinic to schedule another appt.

## 2021-01-09 NOTE — Patient Instructions (Signed)
Thank you for coming in today.  Plan for muscle relaxer.  If you get worse go to the ER.  Recheck in 1 week.

## 2021-01-14 ENCOUNTER — Other Ambulatory Visit: Payer: Self-pay

## 2021-01-14 ENCOUNTER — Encounter (INDEPENDENT_AMBULATORY_CARE_PROVIDER_SITE_OTHER): Payer: Self-pay | Admitting: Family Medicine

## 2021-01-14 ENCOUNTER — Ambulatory Visit: Admission: RE | Admit: 2021-01-14 | Payer: Medicaid Other | Source: Ambulatory Visit

## 2021-01-14 ENCOUNTER — Ambulatory Visit (INDEPENDENT_AMBULATORY_CARE_PROVIDER_SITE_OTHER): Payer: Medicaid Other | Admitting: Family Medicine

## 2021-01-14 VITALS — BP 145/72 | HR 102 | Temp 98.0°F | Ht 74.0 in | Wt >= 6400 oz

## 2021-01-14 DIAGNOSIS — D509 Iron deficiency anemia, unspecified: Secondary | ICD-10-CM

## 2021-01-14 DIAGNOSIS — E8881 Metabolic syndrome: Secondary | ICD-10-CM | POA: Diagnosis not present

## 2021-01-14 DIAGNOSIS — Z6841 Body Mass Index (BMI) 40.0 and over, adult: Secondary | ICD-10-CM

## 2021-01-14 DIAGNOSIS — E559 Vitamin D deficiency, unspecified: Secondary | ICD-10-CM

## 2021-01-14 MED ORDER — VICTOZA 18 MG/3ML ~~LOC~~ SOPN
1.2000 mg | PEN_INJECTOR | Freq: Every day | SUBCUTANEOUS | 0 refills | Status: DC
Start: 2021-01-14 — End: 2021-04-11

## 2021-01-14 NOTE — Progress Notes (Signed)
X-ray thoracic spine shows no fractures

## 2021-01-14 NOTE — Progress Notes (Signed)
X-ray lumbar spine shows no fractures.

## 2021-01-14 NOTE — Progress Notes (Signed)
X-ray cervical spine shows no fractures

## 2021-01-15 ENCOUNTER — Ambulatory Visit: Payer: Medicaid Other | Admitting: Physical Therapy

## 2021-01-15 LAB — ANEMIA PANEL
Ferritin: 73 ng/mL (ref 15–150)
Folate, Hemolysate: 258 ng/mL
Folate, RBC: 648 ng/mL (ref >498)
Hematocrit: 39.8 % (ref 34.0–46.6)
Iron Saturation: 13 % — ABNORMAL LOW (ref 15–55)
Iron: 51 ug/dL (ref 27–159)
Retic Ct Pct: 1.9 % (ref 0.6–2.6)
Total Iron Binding Capacity: 384 ug/dL (ref 250–450)
UIBC: 333 ug/dL (ref 131–425)
Vitamin B-12: 290 pg/mL (ref 232–1245)

## 2021-01-15 LAB — CBC WITH DIFFERENTIAL/PLATELET
Basophils Absolute: 0 10*3/uL (ref 0.0–0.2)
Basos: 1 %
EOS (ABSOLUTE): 0 10*3/uL (ref 0.0–0.4)
Eos: 1 %
Hemoglobin: 12.3 g/dL (ref 11.1–15.9)
Immature Grans (Abs): 0.1 10*3/uL (ref 0.0–0.1)
Immature Granulocytes: 1 %
Lymphocytes Absolute: 1.6 10*3/uL (ref 0.7–3.1)
Lymphs: 26 %
MCH: 22.5 pg — ABNORMAL LOW (ref 26.6–33.0)
MCHC: 30.9 g/dL — ABNORMAL LOW (ref 31.5–35.7)
MCV: 73 fL — ABNORMAL LOW (ref 79–97)
Monocytes Absolute: 0.4 10*3/uL (ref 0.1–0.9)
Monocytes: 6 %
Neutrophils Absolute: 4.1 10*3/uL (ref 1.4–7.0)
Neutrophils: 65 %
Platelets: 270 10*3/uL (ref 150–450)
RBC: 5.46 x10E6/uL — ABNORMAL HIGH (ref 3.77–5.28)
RDW: 15.8 % — ABNORMAL HIGH (ref 11.7–15.4)
WBC: 6.3 10*3/uL (ref 3.4–10.8)

## 2021-01-15 LAB — VITAMIN D 25 HYDROXY (VIT D DEFICIENCY, FRACTURES): Vit D, 25-Hydroxy: 15.7 ng/mL — ABNORMAL LOW (ref 30.0–100.0)

## 2021-01-15 NOTE — Progress Notes (Deleted)
I, Wendy Poet, LAT, ATC, am serving as scribe for Dr. [name].  Maureen Lewis is a 25 y.o. female who presents to [place] at [facility] today for f/u of neck, mid-back and low back pain after suffering a fall on 01/09/21 in which she fell down approximately 7 steps after missing a step.  She was last seen by Dr. Georgina Snell on 01/09/21 and was prescribed Tizanidine.  Today, pt reports  Diagnostic testing: C-spine, T-spine and L-spine XR- 01/09/21   Pertinent review of systems: ***  Relevant historical information: ***   Exam:  LMP 01/02/2021  General: Well Developed, well nourished, and in no acute distress.   MSK: ***    Lab and Radiology Results Results for orders placed or performed in visit on 01/14/21 (from the past 72 hour(s))  CBC w/Diff/Platelet     Status: Abnormal   Collection Time: 01/14/21 11:19 AM  Result Value Ref Range   WBC 6.3 3.4 - 10.8 x10E3/uL   RBC 5.46 (H) 3.77 - 5.28 x10E6/uL   Hemoglobin 12.3 11.1 - 15.9 g/dL   MCV 73 (L) 79 - 97 fL   MCH 22.5 (L) 26.6 - 33.0 pg   MCHC 30.9 (L) 31.5 - 35.7 g/dL   RDW 15.8 (H) 11.7 - 15.4 %   Platelets 270 150 - 450 x10E3/uL   Neutrophils 65 Not Estab. %   Lymphs 26 Not Estab. %   Monocytes 6 Not Estab. %   Eos 1 Not Estab. %   Basos 1 Not Estab. %   Neutrophils Absolute 4.1 1.4 - 7.0 x10E3/uL   Lymphocytes Absolute 1.6 0.7 - 3.1 x10E3/uL   Monocytes Absolute 0.4 0.1 - 0.9 x10E3/uL   EOS (ABSOLUTE) 0.0 0.0 - 0.4 x10E3/uL   Basophils Absolute 0.0 0.0 - 0.2 x10E3/uL   Immature Granulocytes 1 Not Estab. %   Immature Grans (Abs) 0.1 0.0 - 0.1 x10E3/uL  Anemia panel     Status: Abnormal (Preliminary result)   Collection Time: 01/14/21 11:19 AM  Result Value Ref Range   Total Iron Binding Capacity 384 250 - 450 ug/dL   UIBC 333 131 - 425 ug/dL   Iron 51 27 - 159 ug/dL   Iron Saturation 13 (L) 15 - 55 %   Vitamin B-12 290 232 - 1,245 pg/mL   Folate, Hemolysate WILL FOLLOW     Hematocrit 39.8 34.0 - 46.6 %   Folate, RBC WILL FOLLOW    Ferritin 73 15 - 150 ng/mL   Retic Ct Pct 1.9 0.6 - 2.6 %  VITAMIN D 25 Hydroxy (Vit-D Deficiency, Fractures)     Status: Abnormal   Collection Time: 01/14/21 11:19 AM  Result Value Ref Range   Vit D, 25-Hydroxy 15.7 (L) 30.0 - 100.0 ng/mL    Comment: Vitamin D deficiency has been defined by the Institute of Medicine and an Endocrine Society practice guideline as a level of serum 25-OH vitamin D less than 20 ng/mL (1,2). The Endocrine Society went on to further define vitamin D insufficiency as a level between 21 and 29 ng/mL (2). 1. IOM (Institute of Medicine). 2010. Dietary reference    intakes for calcium and D. [place]: The    [employer]. 2. Holick MF, Binkley [place], Bischoff-Ferrari HA, et al.    Evaluation, treatment, and prevention of vitamin D    deficiency: an Endocrine Society clinical practice    guideline. JCEM. 2011 Jul; 96(7):1911-30.    No results found.  Assessment and Plan: 25 y.o. female with ***   PDMP not reviewed this encounter. No orders of the defined types were placed in this encounter.  No orders of the defined types were placed in this encounter.    Discussed warning signs or symptoms. Please see discharge instructions. Patient expresses understanding.   ***

## 2021-01-16 ENCOUNTER — Ambulatory Visit: Payer: Medicaid Other | Admitting: Obstetrics and Gynecology

## 2021-01-16 ENCOUNTER — Ambulatory Visit: Payer: Medicaid Other | Admitting: Family Medicine

## 2021-01-16 NOTE — Progress Notes (Signed)
Chief Complaint:   OBESITY Maureen Lewis is here to discuss her progress with her obesity treatment plan along with follow-up of her obesity related diagnoses. Maureen Lewis is on the Category 3 Plan and states she is following her eating plan approximately 90-95% of the time. Maureen Lewis states she is walking 30 minutes 3-4 times per week.  Today's visit was #: 8 Starting weight: 437 lbs Starting date: 04/09/2020 Today's weight: 480 lbs Today's date: 01/14/2021 Total lbs lost to date: 0 Total lbs lost since last in-office visit: 0  Interim History: Maureen Lewis fell and got a concussion. She is trying to stay home and not do much. She is spending time with her daughter and nephew. She is having to remind herself to eat and still getting everything in. Pt trying to implement snacks daily.  Subjective:   1. Metabolic syndrome Maureen Lewis is on Victoza 0.6 mg SubQ daily with no GI side effects. She is feeling minimal incentive to eat.   2. Vitamin D deficiency Maureen Lewis denies nausea, vomiting, and muscle weakness but notes fatigue. Pt is on prescription Vit D.  3. Microcytic anemia Maureen Lewis is on PO iron replacement. She reports fatigue. Her last CBC is still showing MCV <80. Increased red blood cell count.   Ref. Range 11/08/2020 11:31  MCV Latest Ref Range: 78.0 - 100.0 fl 69.8 Repeated and verified X2. (L)    Ref. Range 11/08/2020 11:31  RBC Latest Ref Range: 3.87 - 5.11 Mil/uL 5.42 (H)   Assessment/Plan:   1. Metabolic syndrome Increase Victoza to 0.9 mg SubQ daily for 1 week, then check in and consider increase to 1.2 mg.  - liraglutide (VICTOZA) 18 MG/3ML SOPN; Inject 1.2 mg into the skin daily. 1.2  Dispense: 6 mL; Refill: 0  2. Vitamin D deficiency Low Vitamin D level contributes to fatigue and are associated with obesity, breast, and colon cancer. She agrees to continue to take prescription Vitamin D @50 ,000 IU every week and will follow-up for routine testing of Vitamin D, at least 2-3 times per year to  avoid over-replacement. Discussed labs with patient today.  - VITAMIN D 25 Hydroxy (Vit-D Deficiency, Fractures)  3. Microcytic anemia Check labs today.  - CBC w/Diff/Platelet - Anemia panel  4. Obesity with current BMI is 61.7 Maureen Lewis is currently in the action stage of change. As such, her goal is to continue with weight loss efforts. She has agreed to the Category 3 Plan and the Category 4 Plan.   Exercise goals: No exercise has been prescribed at this time.  Behavioral modification strategies: increasing lean protein intake, meal planning and cooking strategies, keeping healthy foods in the home and planning for success.  Cynthie has agreed to follow-up with our clinic in 2 weeks. She was informed of the importance of frequent follow-up visits to maximize her success with intensive lifestyle modifications for her multiple health conditions.   Sabina was informed we would discuss her lab results at her next visit unless there is a critical issue that needs to be addressed sooner. Mickenzie agreed to keep her next visit at the agreed upon time to discuss these results.  Objective:   Blood pressure (!) 145/72, pulse (!) 102, temperature 98 F (36.7 C), temperature source Oral, height 6\' 2"  (1.88 m), weight (!) 480 lb (217.7 kg), last menstrual period 01/02/2021, SpO2 97 %, not currently breastfeeding. Body mass index is 61.63 kg/m.  General: Cooperative, alert, well developed, in no acute distress. HEENT: Conjunctivae and lids unremarkable. Cardiovascular: Regular  rhythm.  Lungs: Normal work of breathing. Neurologic: No focal deficits.   Lab Results  Component Value Date   CREATININE 0.52 12/13/2020   BUN 11 12/13/2020   NA 137 12/13/2020   K 4.1 12/13/2020   CL 104 12/13/2020   CO2 23 12/13/2020   Lab Results  Component Value Date   ALT 14 11/08/2020   AST 11 11/08/2020   ALKPHOS 88 11/08/2020   BILITOT 0.3 11/08/2020   Lab Results  Component Value Date   HGBA1C 6.1  11/08/2020   HGBA1C 6.0 (H) 04/09/2020   HGBA1C 5.8 (H) 04/06/2019   HGBA1C 6.0 04/27/2018   HGBA1C 5.8 (H) 08/13/2017   Lab Results  Component Value Date   INSULIN 36.8 (H) 04/09/2020   INSULIN 42.1 (H) 08/13/2017   Lab Results  Component Value Date   TSH 1.240 11/19/2020   Lab Results  Component Value Date   CHOL 118 11/08/2020   HDL 36.00 (L) 11/08/2020   LDLCALC 69 11/08/2020   TRIG 64.0 11/08/2020   CHOLHDL 3 11/08/2020   Lab Results  Component Value Date   WBC 6.3 01/14/2021   HGB 12.3 01/14/2021   HCT 39.8 01/14/2021   MCV 73 (L) 01/14/2021   PLT 270 01/14/2021   Lab Results  Component Value Date   IRON 51 01/14/2021   TIBC 384 01/14/2021   FERRITIN 73 01/14/2021    Attestation Statements:   Reviewed by clinician on day of visit: allergies, medications, problem list, medical history, surgical history, family history, social history, and previous encounter notes.  Time spent on visit including pre-visit chart review and post-visit care and charting was 25 minutes.   Coral Ceo, am acting as transcriptionist for Coralie Common, MD.   I have reviewed the above documentation for accuracy and completeness, and I agree with the above. - Jinny Blossom, MD

## 2021-01-16 NOTE — Progress Notes (Signed)
   I, Peterson Lombard, LAT, ATC acting as a scribe for [name], MD.  Karie Fetch Renee Rodger is a 25 y.o. female who presents to [place] at [facility] today for f/u concussion and muscle spasm of c-spine and L-spine. Pt was last seen by Dr. Georgina Snell on 01/09/21 and was prescribed tizanidine, and advised to use a heating pad. Today, pt reports continued pain in neck and HA. Pt c/o HA being pretty severe. Pt increased tizanidine dosage to 2 pills.  She has a follow-up appointment with her ophthalmologist.  She had recent eye surgery and has been told to avoid any exercise or physical therapy until at least her follow-up on April 11.  Dx imaging: 01/09/21 C-spine, T-spine, & L-spine XR  09/07/20 R ankle MRI             08/07/20 R ankle XR 07/10/20 R ankle XR  Pertinent review of systems: No fevers or chills  Relevant historical information: Morbid obesity.  Recent eye surgery.   Exam:  BP (!) 148/88 (BP Location: Right Arm, Patient Position: Sitting, Cuff Size: Large)   Pulse (!) 117   Ht 6\' 2"  (1.88 m)   Wt (!) 481 lb 3.2 oz (218.3 kg)   LMP 01/02/2021   SpO2 95%   BMI 61.78 kg/m  General: Well Developed, well nourished, and in no acute distress.   MSK: C-spine normal-appearing Nontender midline. Tender palpation left cervical paraspinal musculature at insertion onto the skull. Upper extremity strength reflexes and sensation are equal normal throughout bilateral upper extremities. Normal coordination balance and gait.     Assessment and Plan: 25 y.o. female with fall with neck pain.  Significantly improved however patient still has a posterior headache extending onto the superior scalp.  This is consistent with occipital neuralgia secondary to her neck muscle spasm and dysfunction.  Plan for physical therapy.  Ideally she should have clearance from her ophthalmologist prior to proceeding with physical therapy.  She has a follow-up appoint with  ophthalmology in about 2 weeks.  We will go ahead and refer to PT now with anticipated start date just after her ophthalmology appointment as I anticipate that she will be cleared to start limited PT at that time.  Dissipate dry needling will be helpful.  Continue limited tizanidine for now.  Recheck back in about 1 month.   PDMP not reviewed this encounter. Orders Placed This Encounter  Procedures  . Ambulatory referral to Physical Therapy    Referral Priority:   Routine    Referral Type:   Physical Medicine    Referral Reason:   Specialty Services Required    Requested Specialty:   Physical Therapy   No orders of the defined types were placed in this encounter.    Discussed warning signs or symptoms. Please see discharge instructions. Patient expresses understanding.   The above documentation has been reviewed and is accurate and complete [name], M.D.

## 2021-01-17 ENCOUNTER — Ambulatory Visit (INDEPENDENT_AMBULATORY_CARE_PROVIDER_SITE_OTHER): Payer: Medicaid Other | Admitting: Family Medicine

## 2021-01-17 ENCOUNTER — Other Ambulatory Visit: Payer: Self-pay

## 2021-01-17 VITALS — BP 148/88 | HR 117 | Ht 74.0 in | Wt >= 6400 oz

## 2021-01-17 DIAGNOSIS — M5481 Occipital neuralgia: Secondary | ICD-10-CM | POA: Diagnosis not present

## 2021-01-17 DIAGNOSIS — M542 Cervicalgia: Secondary | ICD-10-CM

## 2021-01-17 NOTE — Patient Instructions (Addendum)
Thank you for coming in today.  Plan for PT after your see your eye doctor in 2 weeks.   Ok to continue the muscle relaxer.   Ok to use heat and stretching.   Recheck in 1 month.

## 2021-01-18 DIAGNOSIS — M545 Low back pain, unspecified: Secondary | ICD-10-CM | POA: Diagnosis not present

## 2021-01-18 DIAGNOSIS — M542 Cervicalgia: Secondary | ICD-10-CM | POA: Diagnosis not present

## 2021-01-18 DIAGNOSIS — Z79899 Other long term (current) drug therapy: Secondary | ICD-10-CM | POA: Diagnosis not present

## 2021-01-18 DIAGNOSIS — N915 Oligomenorrhea, unspecified: Secondary | ICD-10-CM | POA: Diagnosis not present

## 2021-01-18 DIAGNOSIS — M419 Scoliosis, unspecified: Secondary | ICD-10-CM | POA: Diagnosis not present

## 2021-01-18 DIAGNOSIS — M25571 Pain in right ankle and joints of right foot: Secondary | ICD-10-CM | POA: Diagnosis not present

## 2021-01-22 ENCOUNTER — Encounter: Payer: Medicaid Other | Admitting: Physical Therapy

## 2021-01-23 ENCOUNTER — Ambulatory Visit: Payer: Medicaid Other | Admitting: Obstetrics and Gynecology

## 2021-01-28 ENCOUNTER — Ambulatory Visit (INDEPENDENT_AMBULATORY_CARE_PROVIDER_SITE_OTHER): Payer: Medicaid Other | Admitting: Family Medicine

## 2021-01-29 ENCOUNTER — Other Ambulatory Visit: Payer: Self-pay

## 2021-01-29 ENCOUNTER — Ambulatory Visit (INDEPENDENT_AMBULATORY_CARE_PROVIDER_SITE_OTHER): Payer: Medicaid Other

## 2021-01-29 ENCOUNTER — Other Ambulatory Visit (HOSPITAL_COMMUNITY)
Admission: RE | Admit: 2021-01-29 | Discharge: 2021-01-29 | Disposition: A | Discharge status: Home / Self Care | Payer: Medicaid Other | Source: Ambulatory Visit | Attending: Obstetrics | Admitting: Obstetrics

## 2021-01-29 ENCOUNTER — Encounter: Payer: Medicaid Other | Admitting: Physical Therapy

## 2021-01-29 DIAGNOSIS — N898 Other specified noninflammatory disorders of vagina: Secondary | ICD-10-CM | POA: Insufficient documentation

## 2021-01-29 DIAGNOSIS — R350 Frequency of micturition: Secondary | ICD-10-CM

## 2021-01-29 LAB — POCT URINALYSIS DIPSTICK
Bilirubin, UA: NEGATIVE
Blood, UA: NEGATIVE
Glucose, UA: NEGATIVE
Ketones, UA: NEGATIVE
Leukocytes, UA: NEGATIVE
Nitrite, UA: NEGATIVE
Protein, UA: NEGATIVE
Spec Grav, UA: 1.01 (ref 1.010–1.025)
Urobilinogen, UA: 0.2 E.U./dL
pH, UA: 7 (ref 5.0–8.0)

## 2021-01-29 NOTE — Progress Notes (Signed)
Patient presents for STD testing. Patient complains of having vaginal irritation and itching.   SUBJECTIVE:  25 y.o. female complains of no vaginal discharge. Vaginal irritation and odor that has been present for about 2 weeks. She also has been having frequent urination, lower abdominal pain, and lower back pain. Denies abnormal vaginal bleeding or significant pelvic pain or fever. No UTI symptoms. Denies history of known exposure to STD.  Patient's last menstrual period was 01/02/2021.  OBJECTIVE:  She appears well, afebrile. Urine dipstick: negative for all components. Urine culture sent due to symptoms  ASSESSMENT:  Vaginal Discharge  Vaginal Odor   PLAN:  GC, chlamydia, trichomonas, BVAG, CVAG probe sent to lab. Treatment: To be determined once lab results are received ROV prn if symptoms persist or worsen.

## 2021-01-29 NOTE — Progress Notes (Signed)
Agree with A & P. 

## 2021-01-30 LAB — CERVICOVAGINAL ANCILLARY ONLY
Bacterial Vaginitis (gardnerella): NEGATIVE
Candida Glabrata: NEGATIVE
Candida Vaginitis: NEGATIVE
Chlamydia: NEGATIVE
Comment: NEGATIVE
Comment: NEGATIVE
Comment: NEGATIVE
Comment: NEGATIVE
Comment: NEGATIVE
Comment: NORMAL
Neisseria Gonorrhea: NEGATIVE
Trichomonas: NEGATIVE

## 2021-01-31 LAB — URINE CULTURE

## 2021-02-04 ENCOUNTER — Telehealth: Payer: Self-pay | Admitting: Family Medicine

## 2021-02-04 NOTE — Telephone Encounter (Signed)
Patient called stating that Dr Georgina Snell had originally noted for her to return to work on April 26th. She said that she will need updated paperwork to state any restrictions that she might need (a stool to sit on if needed, etc). Paperwork is in Dr [employer].  Please advise.

## 2021-02-05 ENCOUNTER — Encounter: Payer: Medicaid Other | Admitting: Physical Therapy

## 2021-02-05 NOTE — Telephone Encounter (Signed)
done

## 2021-02-13 NOTE — Progress Notes (Signed)
I, Maureen Lewis, LAT, ATC, am serving as scribe for Dr. [name].  Maureen Lewis is a 25 y.o. female who presents to [facility] today for f/u neck pain sustain on 01/09/21 after a misstep and fall down the first 7 steps. Pt was last seen by Dr. Georgina Snell on 01/17/21 and was referred to PT pending clearance from ophthalmology and advised to continue limited tizanidine. Pt contacted the office on 4/18 requesting a return to work note starting 4/26 w/ accommodations. Today, pt reports that she wants to talk about her low back and her R ankle.  She reports LBP since she fell down the stairs and notes that it con't to hurt since then.  Lifting is an aggravating factors.  Her R ankle is swelling and she has started to have tingling and numbness in her R foot and ankle followed by sharp, needle-like pain when the feeling returns.  She notes her neck pain is a lot better.  Her main issues today are low back pain and right ankle pain.  Dx imaging: 01/09/21 C-spine, T-spine, & L-spine XR             09/07/20 R ankle MRI 08/07/20 R ankle XR 07/10/20 R ankle XR  Pertinent review of systems: No fevers or chills  Relevant historical information: Morbid obesity.  Hypertension, history posterior malleolus ankle fracture   Exam:  BP (!) 150/90 (BP Location: Right Arm, Patient Position: Sitting, Cuff Size: Large)   Pulse (!) 104   Ht 6\' 2"  (1.88 m)   Wt (!) 488 lb 9.6 oz (221.6 kg)   SpO2 97%   BMI 62.73 kg/m  General: Well Developed, well nourished, and in no acute distress.   MSK: Right ankle normal-appearing Nontender normal motion. Stable ligamentous exam.   L-spine normal-appearing Nontender midline. Decreased lumbar motion.    Lab and Radiology Results  X-ray images right ankle obtained today personally independently interpreted No acute fractures.  Minimal degenerative changes. Await formal radiology  review   EXAM: THORACIC SPINE 2 VIEWS; LUMBAR SPINE - 2-3 VIEW; CERVICAL SPINE - COMPLETE 4+ VIEW  COMPARISON:  None.  FINDINGS: There is no evidence of cervical, thoracic, or lumbar spine fracture. Alignment is normal. No other significant bone abnormalities are identified.There is cardiomegaly. Exam was limited by patient body habitus.  IMPRESSION: 1. Cardiomegaly. 2. No acute osseous abnormality involving the cervical, thoracic, or lumbar spine. Exam was limited by patient body habitus.   Electronically Signed   By: Constance Holster M.D.   On: 01/11/2021 13:03  EXAM: MRI OF THE RIGHT ANKLE WITHOUT CONTRAST  TECHNIQUE: Multiplanar, multisequence MR imaging of the ankle was performed. No intravenous contrast was administered.  COMPARISON:  None.  FINDINGS: TENDONS  Peroneal: Peroneal longus tendon intact. Peroneal brevis intact.  Posteromedial: Posterior tibial tendon intact. Flexor hallucis longus tendon intact. Flexor digitorum longus tendon intact.  Anterior: Tibialis anterior tendon intact. Extensor hallucis longus tendon intact Extensor digitorum longus tendon intact.  Achilles:  Intact.  Plantar Fascia: Intact.  LIGAMENTS  Lateral: Anterior talofibular ligament intact. Calcaneofibular ligament intact. Posterior talofibular ligament intact. Anterior and posterior tibiofibular ligaments intact.  Medial: Deltoid ligament intact. Spring ligament intact.  CARTILAGE  Ankle Joint: No joint effusion. Normal ankle mortise. No chondral defect.  Subtalar Joints/Sinus Tarsi: Normal subtalar joints. No subtalar joint effusion. Normal sinus tarsi.  Bones: Subacute nondisplaced fracture of the posterior malleolus of the distal tibia with mild surrounding marrow edema. No acute  fracture or dislocation.  Soft Tissue: No fluid collection or hematoma. Muscles are normal without edema or atrophy. Tarsal tunnel is  normal.  IMPRESSION: 1. Subacute nondisplaced fracture of the posterior malleolus of the distal tibia with mild surrounding marrow edema.   Electronically Signed   By: Kathreen Devoid   On: 09/10/2020 15:23  I, [name], personally (independently) visualized and performed the interpretation of the images attached in this note.   Assessment and Plan: 25 y.o. female with right ankle pain after fall.  Likely sprain or exacerbation of prior ankle injury.  Patient had a prior posterior malleolus fracture Was doing really well prior to her injury.  Plan for physical therapy and ASO ankle brace.  Recheck in about a month.  Low back pain: Again worsening since her fall.  X-ray 1 month ago after the fall did not show acute fracture.  Physical therapy should be helpful.  Ordered today.  Refill tizanidine.  Neck pain has resolved.   PDMP not reviewed this encounter. Orders Placed This Encounter  Procedures  . DG Ankle Complete Right    Standing Status:   Future    Number of Occurrences:   1    Standing Expiration Date:   02/14/2022    Order Specific Question:   Reason for Exam (SYMPTOM  OR DIAGNOSIS REQUIRED)    Answer:   eval ankle pain after fall    Order Specific Question:   Is patient pregnant?    Answer:   No    Order Specific Question:   Preferred imaging location?    Answer:   [facility]  . Ambulatory referral to Physical Therapy    Referral Priority:   Routine    Referral Type:   Physical Medicine    Referral Reason:   Specialty Services Required    Requested Specialty:   Physical Therapy   Meds ordered this encounter  Medications  . tiZANidine (ZANAFLEX) 4 MG tablet    Sig: Take 1 tablet (4 mg total) by mouth every 6 (six) hours as needed for muscle spasms.    Dispense:  30 tablet    Refill:  2     Discussed warning signs or symptoms. Please see discharge instructions. Patient expresses understanding.   The above documentation has been reviewed and is  accurate and complete [name], M.D.

## 2021-02-14 ENCOUNTER — Ambulatory Visit (INDEPENDENT_AMBULATORY_CARE_PROVIDER_SITE_OTHER): Payer: Medicaid Other

## 2021-02-14 ENCOUNTER — Other Ambulatory Visit: Payer: Self-pay

## 2021-02-14 ENCOUNTER — Ambulatory Visit (INDEPENDENT_AMBULATORY_CARE_PROVIDER_SITE_OTHER): Payer: Medicaid Other | Admitting: Family Medicine

## 2021-02-14 ENCOUNTER — Encounter: Payer: Self-pay | Admitting: Family Medicine

## 2021-02-14 VITALS — BP 150/90 | HR 104 | Ht 74.0 in | Wt >= 6400 oz

## 2021-02-14 DIAGNOSIS — S82891S Other fracture of right lower leg, sequela: Secondary | ICD-10-CM | POA: Diagnosis not present

## 2021-02-14 DIAGNOSIS — M25571 Pain in right ankle and joints of right foot: Secondary | ICD-10-CM

## 2021-02-14 DIAGNOSIS — M549 Dorsalgia, unspecified: Secondary | ICD-10-CM | POA: Diagnosis not present

## 2021-02-14 DIAGNOSIS — S93401A Sprain of unspecified ligament of right ankle, initial encounter: Secondary | ICD-10-CM

## 2021-02-14 DIAGNOSIS — M25572 Pain in left ankle and joints of left foot: Secondary | ICD-10-CM | POA: Diagnosis not present

## 2021-02-14 DIAGNOSIS — M7989 Other specified soft tissue disorders: Secondary | ICD-10-CM | POA: Diagnosis not present

## 2021-02-14 MED ORDER — TIZANIDINE HCL 4 MG PO TABS: 4.0000 mg | ORAL_TABLET | Freq: Four times a day (QID) | ORAL | 2 refills | Status: DC | PRN

## 2021-02-14 NOTE — Patient Instructions (Addendum)
Thank you for coming in today.  I've referred you to Physical Therapy.  Let us know if you don't hear from them in one week.  Please use Voltaren gel (Generic Diclofenac Gel) up to 4x daily for pain as needed.  This is available over-the-counter as both the name brand Voltaren gel and the generic diclofenac gel.  Heating pad for your back.   Please get an Xray today before you leave  Recheck in 1 month.

## 2021-02-18 ENCOUNTER — Ambulatory Visit (INDEPENDENT_AMBULATORY_CARE_PROVIDER_SITE_OTHER): Payer: Medicaid Other | Admitting: Family Medicine

## 2021-02-18 DIAGNOSIS — N915 Oligomenorrhea, unspecified: Secondary | ICD-10-CM | POA: Diagnosis not present

## 2021-02-18 DIAGNOSIS — M542 Cervicalgia: Secondary | ICD-10-CM | POA: Diagnosis not present

## 2021-02-18 DIAGNOSIS — M545 Low back pain, unspecified: Secondary | ICD-10-CM | POA: Diagnosis not present

## 2021-02-18 DIAGNOSIS — M419 Scoliosis, unspecified: Secondary | ICD-10-CM | POA: Diagnosis not present

## 2021-02-18 DIAGNOSIS — Z6841 Body Mass Index (BMI) 40.0 and over, adult: Secondary | ICD-10-CM | POA: Diagnosis not present

## 2021-02-18 DIAGNOSIS — M25571 Pain in right ankle and joints of right foot: Secondary | ICD-10-CM | POA: Diagnosis not present

## 2021-02-18 NOTE — Progress Notes (Signed)
X-ray right ankle looks basically normal to radiology

## 2021-02-24 ENCOUNTER — Emergency Department (HOSPITAL_COMMUNITY): Payer: Medicaid Other

## 2021-02-24 ENCOUNTER — Emergency Department (HOSPITAL_COMMUNITY)
Admission: EM | Admit: 2021-02-24 | Discharge: 2021-02-24 | Disposition: A | Discharge status: Home / Self Care | Payer: Medicaid Other | Attending: Emergency Medicine | Admitting: Emergency Medicine

## 2021-02-24 ENCOUNTER — Other Ambulatory Visit: Payer: Self-pay

## 2021-02-24 ENCOUNTER — Encounter (HOSPITAL_COMMUNITY): Payer: Self-pay | Admitting: Emergency Medicine

## 2021-02-24 DIAGNOSIS — R42 Dizziness and giddiness: Secondary | ICD-10-CM | POA: Diagnosis not present

## 2021-02-24 DIAGNOSIS — Z79899 Other long term (current) drug therapy: Secondary | ICD-10-CM | POA: Insufficient documentation

## 2021-02-24 DIAGNOSIS — R110 Nausea: Secondary | ICD-10-CM | POA: Diagnosis not present

## 2021-02-24 DIAGNOSIS — F419 Anxiety disorder, unspecified: Secondary | ICD-10-CM | POA: Diagnosis not present

## 2021-02-24 DIAGNOSIS — R079 Chest pain, unspecified: Secondary | ICD-10-CM | POA: Diagnosis not present

## 2021-02-24 DIAGNOSIS — I10 Essential (primary) hypertension: Secondary | ICD-10-CM | POA: Insufficient documentation

## 2021-02-24 DIAGNOSIS — Z8616 Personal history of COVID-19: Secondary | ICD-10-CM | POA: Insufficient documentation

## 2021-02-24 DIAGNOSIS — R457 State of emotional shock and stress, unspecified: Secondary | ICD-10-CM | POA: Diagnosis not present

## 2021-02-24 DIAGNOSIS — R000 Tachycardia, unspecified: Secondary | ICD-10-CM | POA: Diagnosis not present

## 2021-02-24 LAB — BASIC METABOLIC PANEL
Anion gap: 9 (ref 5–15)
BUN: 8 mg/dL (ref 6–20)
CO2: 25 mmol/L (ref 22–32)
Calcium: 9.2 mg/dL (ref 8.9–10.3)
Chloride: 104 mmol/L (ref 98–111)
Creatinine, Ser: 0.6 mg/dL (ref 0.44–1.00)
GFR, Estimated: >60 mL/min (ref >60)
Glucose, Bld: 112 mg/dL — ABNORMAL HIGH (ref 70–99)
Potassium: 4 mmol/L (ref 3.5–5.1)
Sodium: 138 mmol/L (ref 135–145)

## 2021-02-24 LAB — CBC
HCT: 43.8 % (ref 36.0–46.0)
Hemoglobin: 13.3 g/dL (ref 12.0–15.0)
MCH: 22.9 pg — ABNORMAL LOW (ref 26.0–34.0)
MCHC: 30.4 g/dL (ref 30.0–36.0)
MCV: 75.3 fL — ABNORMAL LOW (ref 80.0–100.0)
Platelets: 307 10*3/uL (ref 150–400)
RBC: 5.82 MIL/uL — ABNORMAL HIGH (ref 3.87–5.11)
RDW: 14.8 % (ref 11.5–15.5)
WBC: 6.8 10*3/uL (ref 4.0–10.5)
nRBC: 0 % (ref 0.0–0.2)

## 2021-02-24 LAB — I-STAT BETA HCG BLOOD, ED (MC, WL, AP ONLY): I-stat hCG, quantitative: <5 m[IU]/mL (ref <5)

## 2021-02-24 LAB — TROPONIN I (HIGH SENSITIVITY)
Troponin I (High Sensitivity): 4 ng/L (ref <18)
Troponin I (High Sensitivity): 4 ng/L (ref <18)

## 2021-02-24 MED ORDER — SODIUM CHLORIDE 0.9 % IV BOLUS
1000.0000 mL | Freq: Once | INTRAVENOUS | Status: AC
  Administered 2021-02-24: 1000 mL via INTRAVENOUS

## 2021-02-24 NOTE — ED Provider Notes (Signed)
MSE was initiated and I personally evaluated the patient and placed orders (if any) at  3:20 PM on Feb 24, 2021.  The patient appears stable so that the remainder of the MSE may be completed by another provider.   Chief Complaint: anxiety   HPI:  Pt states that she started having anxiety, chest pain and nausea after eating a weed gummy at 14. Has never had one before. Sister got hit from smoke shop. Chest feels more like chest tightness. Was in normal health before this.    ROS: anxiety   Physical Exam:  Gen:                Awake, no distress  HEENT:          Atraumatic  Resp:              Normal effort  Cardiac:          Normal rate  Abd:                Nondistended, nontender  MSK:              Moves extremities without difficulty  Neuro:            Speech clear,EOMS intact      Initiation of care has begun. The patient has been counseled on the process, plan, and necessity for staying for the completion/evaluation, and the remainder of the medical screening examination    Alfredia Client, PA-C 02/24/21 1521    Daleen Bo, MD 02/24/21 [phone]

## 2021-02-24 NOTE — Discharge Instructions (Signed)
As discussed, your evaluation today has been largely reassuring.  But, it is important that you monitor your condition carefully, and do not hesitate to return to the ED if you develop new, or concerning changes in your condition. ? ?Otherwise, please follow-up with your physician for appropriate ongoing care. ? ?

## 2021-02-24 NOTE — ED Triage Notes (Addendum)
Pt to triage via [facility] from home.  Pt is anxious, reports chest pain, and has an aura after eating a THC gummy worm that her friend gave her around noon today.  Pt is trying to wean herself off of BP medication.

## 2021-02-24 NOTE — ED Provider Notes (Signed)
[place] EMERGENCY DEPARTMENT Provider Note   CSN: [id number] Arrival date & time: 02/24/21  1450     History Chief Complaint  Patient presents with  . Anxiety    After eating a gummy worm    Maureen Lewis is a 25 y.o. female.  HPI Presents with unsettled sensation nausea, lightheadedness.  She was in her usual state of health until about 4 hours ago.  Shallowing after eating a THC edible product she felt onset of symptoms.  Symptoms have improved somewhat, she currently has no pain.  There is been no complete syncope.  There was no clear intervention that resulted in improvement of her condition.  She does note a history of hypertension, takes her medication regularly.    Past Medical History:  Diagnosis Date  . Abnormal uterine bleeding   . Acid reflux   . Alpha thalassemia trait   . Amenorrhea   . Anemia    no current med.  . Back pain   . Cesarean delivery delivered 10/11/2019   10/09/2019 - primary CS for failed IOL  . Chest pain    resolved  . Complication of anesthesia    states had to keep giving her anesthesia during EGD  . Constipation   . Depression    "I'm good"  . Fibromyalgia   . Finger mass, left 03/2017   middle finger  . Gestational diabetes 05/23/2019   hx - with pregnancy only   . Gestational hypertension 09/23/2019   Guidelines for Antenatal Testing and Sonography  (with updated ICD-10 codes)  Updated  09-28-2019 with Dr. Tama High  INDICATION U/S 2 X week NST/AFI  or full BPP wkly DELIVERY Diabetes   A1 - good control - O24.410    A2 - good control - O24.419      A2  - poor control or poor compliance - O24.419, E11.65   (Macrosomia or polyhydramnios) **E11.65 is extra code for poor control**    A2/B - O24.  Marland Kitchen Headache   . Hypertension   . Infection    UTI  . Irritable bowel syndrome (IBS)   . Joint pain   . Morbid obesity with body mass index (BMI) of 45.0 to 49.9 in adult [facility])   . Obesity during pregnancy,  antepartum 04/06/2019   Body mass index is 53.71 kg/m.  Recommendations [x]  Aspirin 81 mg daily after 12 weeks; discontinue after 36 weeks [ ]  Nutrition consult [ ]  Weight gain 11-20 lbs for singleton and 25-35 lbs for twin pregnancy (IOM guidelines) Higher class of obesity patients recommended to gain closer to lower limit  Weight loss is associated with adverse outcomes [ ]  Baseline and surveillance labs (pulled in fr  . Plantar fasciitis, bilateral    resolved  . Polycystic ovary syndrome   . Pre-diabetes    no meds, diet controlled, diet not check blood sugar  . Prediabetes    "prediabetes"  . Pregnancy induced hypertension   . Shortness of breath    albuterol inhaler  . Sleep apnea    no CPAP use  . Supervision of normal first pregnancy 04/06/2019   [name]: [ x]Initial [ x]12 [ ] 20 [ ] 28 [ ] 32 [ ] 36 [ ] 38 [ ] 39 [ ] 40 Nursing Staff Provider Office Location [facility]  Dating   LMP Language  English  Anatomy US  Nml Flu Vaccine  Declined 07/28/19 Genetic Screen  NIPS: low risks   AFP:   negative  TDaP vaccine  08/25/19 Hgb A1C or  GTT Early A1C 5.8 Third trimester: GDM insulin Rhogam  NA   LAB RESULTS  Feeding Plan Breast Blood Type  . Vertigo 2017    Patient Active Problem List   Diagnosis Date Noted  . Levator spasm 11/21/2020  . Galactorrhea 11/19/2020  . Closed fracture of right ankle 09/18/2020  . Sprain of ankle 07/18/2020  . Acute hypoxemic respiratory failure due to COVID-19 ([place]) 06/11/2020  . Hx of migraine headaches 04/09/2020  . Postpartum care following cesarean delivery 11/22/2019  . Contraceptive management 11/22/2019  . Positive RPR test 10/09/2019  . HTN (hypertension) 09/23/2019  . Migraine headache 07/12/2019  . Alpha thalassemia trait   . Vitamin D deficiency 09/16/2017  . Prediabetes 09/16/2017  . Depression 09/16/2017  . Class 3 severe obesity with serious comorbidity and body mass index (BMI) of 50.0 to 59.9 in adult ([place]) 09/16/2017  . PCOS  (polycystic ovarian syndrome) 08/13/2017  . Mass 05/06/2017  . Vertigo 10/06/2016  . Microcytic anemia 09/26/2016  . Pseudotumor cerebri syndrome 08/28/2016  . Obesity hypoventilation syndrome ([place]) 08/28/2016  . Benign paroxysmal positional vertigo due to bilateral vestibular disorder 08/28/2016  . OSA (obstructive sleep apnea) 08/28/2016  . Hypersomnia with sleep apnea 08/28/2016  . Female hirsutism 08/28/2016  . GERD (gastroesophageal reflux disease) 02/24/2015  . Constipation 10/02/2014    Past Surgical History:  Procedure Laterality Date  . CESAREAN SECTION N/A 10/09/2019   Procedure: CESAREAN SECTION;  Surgeon: Aletha Halim, MD;  Location: [facility];  Service: Obstetrics;  Laterality: N/A;  . COLONOSCOPY WITH PROPOFOL  10/07/2016  . DILATION AND CURETTAGE OF UTERUS    . ESOPHAGOGASTRODUODENOSCOPY  12/21/2015  . EXCISION MASS UPPER EXTREMETIES Left 04/21/2017   Procedure: EXCISION MASS LEFT MIDDLE FINGER;  Surgeon: Daryll Brod, MD;  Location: [facility];  Service: Orthopedics;  Laterality: Left;  . IR FL GUIDED LOC OF NEEDLE/CATH TIP FOR SPINAL INJECTION RT  03/13/2020  . PHOTOCOAGULATION WITH LASER Left 12/13/2020   Procedure: RETINOPLEXY LEFT EYE;  Surgeon: Sherlynn Stalls, MD;  Location: [place] [facility];  Service: Ophthalmology;  Laterality: Left;  . SCLERAL BUCKLE Right 12/13/2020   Procedure: SCLERAL BUCKLE WITH CRYO;  Surgeon: Sherlynn Stalls, MD;  Location: [place];  Service: Ophthalmology;  Laterality: Right;     OB History    Gravida  1   Para  1   Term  1   Preterm  0   AB  0   Living  1     SAB  0   IAB  0   Ectopic  0   Multiple  0   Live Births  1           Family History  Problem Relation Age of Onset  . Diabetes Maternal Aunt   . Hypertension Maternal Uncle   . Depression Maternal Grandfather   . Diabetes Maternal Grandfather   . Hypertension Maternal Grandfather   . Arthritis Mother   . High blood pressure Mother   . Depression  Mother   . Sleep apnea Mother   . Obesity Mother   . Diabetes Mother   . Hypertension Mother   . Kidney failure Mother   . Sleep apnea Father   . Obesity Father   . Healthy Daughter     Social History   Tobacco Use  . Smoking status: Never Smoker  . Smokeless tobacco: Never Used  Vaping Use  . Vaping Use: Never used  Substance Use Topics  .  Alcohol use: Not Currently    Comment: social   . Drug use: No    Home Medications Prior to Admission medications   Medication Sig Start Date End Date Taking? Authorizing Provider  acetaZOLAMIDE (DIAMOX) 500 MG capsule Take 1 capsule (500 mg total) by mouth 2 (two) times daily. For headache Patient not taking: Reported on 02/14/2021 08/16/20   Gregor Hams, MD  albuterol (VENTOLIN HFA) 108 (90 Base) MCG/ACT inhaler Inhale 2 puffs into the lungs every 6 (six) hours as needed for wheezing or shortness of breath. 09/28/20   Nafziger, Tommi Rumps, NP  amLODipine (NORVASC) 10 MG tablet Take 1 tablet (10 mg total) by mouth daily. 11/08/20   Nafziger, Tommi Rumps, NP  diclofenac (VOLTAREN) 75 MG EC tablet Take 1 tablet (75 mg total) by mouth 2 (two) times daily. 07/11/20   Hudnall, Sharyn Lull, MD  ferrous sulfate 325 (65 FE) MG tablet Take 1 tablet (325 mg total) by mouth every other day. Patient taking differently: Take 325 mg by mouth every Monday, Wednesday, and Friday. 02/23/20 08/27/20  Nafziger, Tommi Rumps, NP  gabapentin (NEURONTIN) 100 MG capsule Take 100 mg by mouth at bedtime. 10/25/20   [provider]  gabapentin (NEURONTIN) 300 MG capsule Take 300 mg by mouth 3 (three) times daily. 12/18/20   [provider]  hydrochlorothiazide (HYDRODIURIL) 25 MG tablet Take 1 tablet (25 mg total) by mouth daily. 11/08/20   Nafziger, Tommi Rumps, NP  ibuprofen (ADVIL) 800 MG tablet Take 800 mg by mouth every 8 (eight) hours as needed. 11/26/20   [provider]  liraglutide (VICTOZA) 18 MG/3ML SOPN Inject 1.2 mg into the skin daily. 1.2 01/14/21   Laqueta Linden, MD  loratadine (CLARITIN) 10 MG tablet Take 10 mg by mouth daily as needed for allergies.     [provider]  oxyCODONE-acetaminophen (PERCOCET/ROXICET) 5-325 MG tablet Take 1-2 tablets by mouth every 6 (six) hours as needed for severe pain. Patient taking differently: Take 1 tablet by mouth 5 (five) times daily. 10/01/20   Shelly Bombard, MD  polyethylene glycol powder (GLYCOLAX/MIRALAX) 17 GM/SCOOP powder Take 17 g by mouth daily as needed for moderate constipation.  10/11/19   [provider]  tiZANidine (ZANAFLEX) 4 MG tablet Take 1 tablet (4 mg total) by mouth every 6 (six) hours as needed for muscle spasms. 02/14/21   Gregor Hams, MD  Vitamin D, Ergocalciferol, (DRISDOL) 1.25 MG (50000 UNIT) CAPS capsule Take 1 capsule (50,000 Units total) by mouth every 7 (seven) days. 12/31/20   Laqueta Linden, MD    Allergies    Bee pollen, Bee venom, Hydrocodone-acetaminophen, Peach flavor, Pollen extract, and Remdesivir  Review of Systems   Review of Systems  Constitutional:       Per HPI, otherwise negative  HENT:       Per HPI, otherwise negative  Respiratory:       Per HPI, otherwise negative  Cardiovascular:       Per HPI, otherwise negative  Gastrointestinal: Positive for nausea. Negative for vomiting.  Endocrine:       Negative aside from HPI  Genitourinary:       Neg aside from HPI   Musculoskeletal:       Per HPI, otherwise negative  Skin: Negative.   Neurological: Positive for dizziness and light-headedness. Negative for syncope.    Physical Exam Updated Vital Signs BP (!) 150/93 (BP Location: Left Wrist)   Pulse (!) 119   Temp 99.3 F (37.4  C) (Oral)   Resp 20   LMP 02/13/2021   SpO2 95%   Physical Exam Vitals and nursing note reviewed.  Constitutional:      General: She is not in acute distress.    Appearance: She is well-developed. She is obese.  HENT:     Head: Normocephalic and atraumatic.  Eyes:     Conjunctiva/sclera:  Conjunctivae normal.  Cardiovascular:     Rate and Rhythm: Normal rate and regular rhythm.  Pulmonary:     Effort: Pulmonary effort is normal. No respiratory distress.     Breath sounds: Normal breath sounds. No stridor.  Abdominal:     General: There is no distension.  Skin:    General: Skin is warm and dry.  Neurological:     Mental Status: She is alert and oriented to person, place, and time.     Cranial Nerves: No cranial nerve deficit.     ED Results / Procedures / Treatments   Labs (all labs ordered are listed, but only abnormal results are displayed) Labs Reviewed  BASIC METABOLIC PANEL - Abnormal; Notable for the following components:      Result Value   Glucose, Bld 112 (*)    All other components within normal limits  CBC - Abnormal; Notable for the following components:   RBC 5.82 (*)    MCV 75.3 (*)    MCH 22.9 (*)    All other components within normal limits  I-STAT BETA HCG BLOOD, ED ([facility], [facility], [facility] ONLY)  TROPONIN I (HIGH SENSITIVITY)  TROPONIN I (HIGH SENSITIVITY)    EKG EKG Interpretation  Date/Time:  Sunday Feb 24 2021 15:12:52 EDT Ventricular Rate:  115 PR Interval:  146 QRS Duration: 92 QT Interval:  326 QTC Calculation: 450 R Axis:   72 Text Interpretation: Sinus tachycardia Otherwise normal ECG Confirmed by Carmin Muskrat [phone]) on 02/24/2021 6:14:05 PM   Radiology DG Chest 2 View  Result Date: 02/24/2021 CLINICAL DATA:  Chest pain EXAM: CHEST - 2 VIEW COMPARISON:  12/11/2020 FINDINGS: The heart size and mediastinal contours are within normal limits. Both lungs are clear. The visualized skeletal structures are unremarkable. IMPRESSION: No active cardiopulmonary disease. Electronically Signed   By: Inez Catalina M.D.   On: 02/24/2021 17:02    Procedures Procedures   Medications Ordered in ED Medications  sodium chloride 0.9 % bolus 1,000 mL (has no administration in time range)    ED Course  I have reviewed the triage vital signs and the  nursing notes.  Pertinent labs & imaging results that were available during my care of the patient were reviewed by me and considered in my medical decision making (see chart for details).    7:32 PM Initial labs reassuring.  8:08 PM Patient in no distress, awake, alert, watching television.  She has mild tachycardia, but is otherwise in no distress.  With no ongoing complaints, reassuring 2 normal troponins, EKG notable over tachycardia, no evidence for pneumonia on x-ray, some suspicion for her ingestion earlier in the day contributing to her presentation.  Patient amenable to, appropriate for discharge.  Final Clinical Impression(s) / ED Diagnoses Final diagnoses:  Dizziness     Carmin Muskrat, MD 02/24/21 2009

## 2021-02-25 ENCOUNTER — Telehealth: Payer: Self-pay

## 2021-02-25 NOTE — Telephone Encounter (Signed)
Transition Care Management Unsuccessful Follow-up Telephone Call  Date of discharge and from where:  02/24/2021 from [facility]  Attempts:  1st Attempt  Reason for unsuccessful TCM follow-up call:  Unable to leave message

## 2021-02-26 NOTE — Telephone Encounter (Signed)
Transition Care Management Follow-up Telephone Call  Date of discharge and from where: 02/24/2021 from [facility]  How have you been since you were released from the hospital? Pt stated that she is feeling a lot better. Pt did not have any questions or concerns at this time.   Any questions or concerns? No  Items Reviewed:  Did the pt receive and understand the discharge instructions provided? Yes   Medications obtained and verified? Yes   Other? No   Any new allergies since your discharge? No   Dietary orders reviewed? n/a  Do you have support at home? Yes   Functional Questionnaire: (I = Independent and D = Dependent) ADLs: I  Bathing/Dressing- I  Meal Prep- I  Eating- I  Maintaining continence- I  Transferring/Ambulation- I  Managing Meds- I   Follow up appointments reviewed:   PCP Hospital f/u appt confirmed? Yes  Scheduled to see Dorothyann Peng, NP on 03/01/2021 @ 1:00pm.  [facility] f/u appt confirmed? Yes  Scheduled to see Michiel Cowboy, MD on 03/19/21 @ 1:00pm.  Are transportation arrangements needed? No   If their condition worsens, is the pt aware to call PCP or go to the Emergency Dept.? Yes  Was the patient provided with contact information for the PCP's office or ED? Yes  Was to pt encouraged to call back with questions or concerns? Yes

## 2021-03-01 ENCOUNTER — Ambulatory Visit: Payer: Medicaid Other | Admitting: Adult Health

## 2021-03-01 ENCOUNTER — Other Ambulatory Visit: Payer: Self-pay

## 2021-03-01 VITALS — BP 154/86 | HR 97 | Temp 99.0°F | Ht 74.0 in | Wt >= 6400 oz

## 2021-03-01 DIAGNOSIS — T6591XA Toxic effect of unspecified substance, accidental (unintentional), initial encounter: Secondary | ICD-10-CM

## 2021-03-01 DIAGNOSIS — I10 Essential (primary) hypertension: Secondary | ICD-10-CM

## 2021-03-01 NOTE — Progress Notes (Signed)
Subjective:    Patient ID: Maureen Lewis, female    DOB: 03/04/1996, 25 y.o.   MRN: [id number]  HPI 25 year old female who  has a past medical history of Abnormal uterine bleeding, Acid reflux, Alpha thalassemia trait, Amenorrhea, Anemia, Back pain, Cesarean delivery delivered (10/11/2019), Chest pain, Complication of anesthesia, Constipation, Depression, Fibromyalgia, Finger mass, left (03/2017), Gestational diabetes (05/23/2019), Gestational hypertension (09/23/2019), Headache, Hypertension, Infection, Irritable bowel syndrome (IBS), Joint pain, Morbid obesity with body mass index (BMI) of 45.0 to 49.9 in adult [facility]), Obesity during pregnancy, antepartum (04/06/2019), Plantar fasciitis, bilateral, Polycystic ovary syndrome, Pre-diabetes, Prediabetes, Pregnancy induced hypertension, Shortness of breath, Sleep apnea, Supervision of normal first pregnancy (04/06/2019), and Vertigo (2017).  She presents to the office today for follow up after being seen in the ER 5 days ago.   Presented to the ER for nausea and lightheadedness.  Symptoms started about 4 hours prior to being seen after eating a THC gummy ( unknowingly).  Her work-up in the ER including EKG, lab work, and troponins were unremarkable except for mild tachycardia.  There was no evidence of pneumonia on x-ray.  She was discharged in good standing.   Today she reports that she continue to have episodes of lightheadedness, and chest tightness ( this has been going on before the THC ingestion). She also reports that she has not taken her BP medication in multiple months   BP Readings from Last 10 Encounters:  03/01/21 (!) 154/86  02/24/21 (!) 142/62  02/14/21 (!) 150/90  01/17/21 (!) 148/88  01/14/21 (!) 145/72  01/09/21 130/90  12/31/20 (!) 144/84  12/25/20 132/78  12/13/20 (!) 144/89  12/11/20 120/74     Review of Systems See HPI   Past Medical History:  Diagnosis Date  . Abnormal uterine bleeding   . Acid reflux    . Alpha thalassemia trait   . Amenorrhea   . Anemia    no current med.  . Back pain   . Cesarean delivery delivered 10/11/2019   10/09/2019 - primary CS for failed IOL  . Chest pain    resolved  . Complication of anesthesia    states had to keep giving her anesthesia during EGD  . Constipation   . Depression    "I'm good"  . Fibromyalgia   . Finger mass, left 03/2017   middle finger  . Gestational diabetes 05/23/2019   hx - with pregnancy only   . Gestational hypertension 09/23/2019   Guidelines for Antenatal Testing and Sonography  (with updated ICD-10 codes)  Updated  09/16/19 with Dr. Tama High  INDICATION U/S 2 X week NST/AFI  or full BPP wkly DELIVERY Diabetes   A1 - good control - O24.410    A2 - good control - O24.419      A2  - poor control or poor compliance - O24.419, E11.65   (Macrosomia or polyhydramnios) **E11.65 is extra code for poor control**    A2/B - O24.  Marland Kitchen Headache   . Hypertension   . Infection    UTI  . Irritable bowel syndrome (IBS)   . Joint pain   . Morbid obesity with body mass index (BMI) of 45.0 to 49.9 in adult [facility])   . Obesity during pregnancy, antepartum 04/06/2019   Body mass index is 53.71 kg/m.  Recommendations [x]  Aspirin 81 mg daily after 12 weeks; discontinue after 36 weeks [ ]  Nutrition consult [ ]  Weight gain 11-20 lbs for singleton and 25-35 lbs  for twin pregnancy (IOM guidelines) Higher class of obesity patients recommended to gain closer to lower limit  Weight loss is associated with adverse outcomes [ ]  Baseline and surveillance labs (pulled in fr  . Plantar fasciitis, bilateral    resolved  . Polycystic ovary syndrome   . Pre-diabetes    no meds, diet controlled, diet not check blood sugar  . Prediabetes    "prediabetes"  . Pregnancy induced hypertension   . Shortness of breath    albuterol inhaler  . Sleep apnea    no CPAP use  . Supervision of normal first pregnancy 04/06/2019   [name]: [ x]Initial [ x]12 [ ] 20  [ ] 28 [ ] 32 [ ] 36 [ ] 38 [ ] 39 [ ] 40 Nursing Staff Provider Office Location [facility]  Dating   LMP Language  English  Anatomy US  Nml Flu Vaccine  Declined 07/28/19 Genetic Screen  NIPS: low risks   AFP:   negative  TDaP vaccine   08/25/19 Hgb A1C or  GTT Early A1C 5.8 Third trimester: GDM insulin Rhogam  NA   LAB RESULTS  Feeding Plan Breast Blood Type  . Vertigo 2017    Social History   Socioeconomic History  . Marital status: Single    Spouse name: Not on file  . Number of children: Not on file  . Years of education: Not on file  . Highest education level: Not on file  Occupational History  . Occupation: Chemical engineer: [id number]  Tobacco Use  . Smoking status: Never Smoker  . Smokeless tobacco: Never Used  Vaping Use  . Vaping Use: Never used  Substance and Sexual Activity  . Alcohol use: Not Currently    Comment: social   . Drug use: No  . Sexual activity: Yes    Birth control/protection: Condom  Other Topics Concern  . Not on file  Social History Narrative   Right handed    Soda sometimes   Lives with mom and cousin, a grandmother in [place] also helps care for her.       She is in nursing school.    Social Determinants of Health   Financial Resource Strain: Not on file  Food Insecurity: Not on file  Transportation Needs: Not on file  Physical Activity: Not on file  Stress: Not on file  Social Connections: Not on file  Intimate Partner Violence: Not on file    Past Surgical History:  Procedure Laterality Date  . CESAREAN SECTION N/A 10/09/2019   Procedure: CESAREAN SECTION;  Surgeon: Aletha Halim, MD;  Location: [facility];  Service: Obstetrics;  Laterality: N/A;  . COLONOSCOPY WITH PROPOFOL  10/07/2016  . DILATION AND CURETTAGE OF UTERUS    . ESOPHAGOGASTRODUODENOSCOPY  12/21/2015  . EXCISION MASS UPPER EXTREMETIES Left 04/21/2017   Procedure: EXCISION MASS LEFT MIDDLE FINGER;  Surgeon: Daryll Brod, MD;  Location: [place];   Service: Orthopedics;  Laterality: Left;  . IR FL GUIDED LOC OF NEEDLE/CATH TIP FOR SPINAL INJECTION RT  03/13/2020  . PHOTOCOAGULATION WITH LASER Left 12/13/2020   Procedure: RETINOPLEXY LEFT EYE;  Surgeon: Sherlynn Stalls, MD;  Location: [place];  Service: Ophthalmology;  Laterality: Left;  . SCLERAL BUCKLE Right 12/13/2020   Procedure: SCLERAL BUCKLE WITH CRYO;  Surgeon: Sherlynn Stalls, MD;  Location: [place];  Service: Ophthalmology;  Laterality: Right;    Family History  Problem Relation Age of Onset  . Diabetes Maternal Aunt   . Hypertension Maternal Uncle   .  Depression Maternal Grandfather   . Diabetes Maternal Grandfather   . Hypertension Maternal Grandfather   . Arthritis Mother   . High blood pressure Mother   . Depression Mother   . Sleep apnea Mother   . Obesity Mother   . Diabetes Mother   . Hypertension Mother   . Kidney failure Mother   . Sleep apnea Father   . Obesity Father   . Healthy Daughter     Allergies  Allergen Reactions  . Bee Pollen Hives, Shortness Of Breath and Swelling  . Bee Venom Hives, Shortness Of Breath and Swelling  . Hydrocodone-Acetaminophen Anaphylaxis    No problem when she takes Tylenol  . Peach Flavor Hives, Shortness Of Breath and Other (See Comments)    SWELLING OF MOUTH  . Pollen Extract Hives, Shortness Of Breath and Swelling  . Remdesivir Swelling    Angioedema 8/25    Current Outpatient Medications on File Prior to Visit  Medication Sig Dispense Refill  . acetaZOLAMIDE (DIAMOX) 500 MG capsule Take 1 capsule (500 mg total) by mouth 2 (two) times daily. For headache (Patient not taking: Reported on 02/14/2021) 60 capsule 2  . albuterol (VENTOLIN HFA) 108 (90 Base) MCG/ACT inhaler Inhale 2 puffs into the lungs every 6 (six) hours as needed for wheezing or shortness of breath. 8 g 0  . amLODipine (NORVASC) 10 MG tablet Take 1 tablet (10 mg total) by mouth daily. 90 tablet 3  . diclofenac (VOLTAREN) 75 MG EC tablet Take 1 tablet (75 mg  total) by mouth 2 (two) times daily. 60 tablet 1  . ferrous sulfate 325 (65 FE) MG tablet Take 1 tablet (325 mg total) by mouth every other day. (Patient taking differently: Take 325 mg by mouth every Monday, Wednesday, and Friday.) 45 tablet 1  . gabapentin (NEURONTIN) 100 MG capsule Take 100 mg by mouth at bedtime.    . gabapentin (NEURONTIN) 300 MG capsule Take 300 mg by mouth 3 (three) times daily.    . hydrochlorothiazide (HYDRODIURIL) 25 MG tablet Take 1 tablet (25 mg total) by mouth daily. 90 tablet 3  . ibuprofen (ADVIL) 800 MG tablet Take 800 mg by mouth every 8 (eight) hours as needed.    . liraglutide (VICTOZA) 18 MG/3ML SOPN Inject 1.2 mg into the skin daily. 1.2 6 mL 0  . loratadine (CLARITIN) 10 MG tablet Take 10 mg by mouth daily as needed for allergies.     Marland Kitchen oxyCODONE-acetaminophen (PERCOCET/ROXICET) 5-325 MG tablet Take 1-2 tablets by mouth every 6 (six) hours as needed for severe pain. (Patient taking differently: Take 1 tablet by mouth 5 (five) times daily.) 20 tablet 0  . polyethylene glycol powder (GLYCOLAX/MIRALAX) 17 GM/SCOOP powder Take 17 g by mouth daily as needed for moderate constipation.     Marland Kitchen tiZANidine (ZANAFLEX) 4 MG tablet Take 1 tablet (4 mg total) by mouth every 6 (six) hours as needed for muscle spasms. 30 tablet 2  . Vitamin D, Ergocalciferol, (DRISDOL) 1.25 MG (50000 UNIT) CAPS capsule Take 1 capsule (50,000 Units total) by mouth every 7 (seven) days. 4 capsule 0   No current facility-administered medications on file prior to visit.    LMP 02/13/2021       Objective:   Physical Exam Vitals and nursing note reviewed.  Constitutional:      Appearance: Normal appearance. She is obese.  Cardiovascular:     Rate and Rhythm: Normal rate and regular rhythm.     Pulses: Normal pulses.  Heart sounds: Normal heart sounds.  Pulmonary:     Effort: Pulmonary effort is normal.     Breath sounds: Normal breath sounds.  Musculoskeletal:        General:  Normal range of motion.  Skin:    General: Skin is warm and dry.     Capillary Refill: Capillary refill takes less than 2 seconds.  Neurological:     General: No focal deficit present.     Mental Status: She is alert and oriented to person, place, and time.  Psychiatric:        Mood and Affect: Mood normal.        Behavior: Behavior normal.        Thought Content: Thought content normal.        Judgment: Judgment normal.       Assessment & Plan:  1. Accidental ingestion of substance, initial encounter - Resolved  2. Essential hypertension - Needs to start taking her BP medications again.  - She has an appointment in one mont for CPE - will reevaluate at that time   Dorothyann Peng, NP

## 2021-03-08 DIAGNOSIS — H5213 Myopia, bilateral: Secondary | ICD-10-CM | POA: Diagnosis not present

## 2021-03-12 ENCOUNTER — Other Ambulatory Visit: Payer: Self-pay

## 2021-03-12 ENCOUNTER — Ambulatory Visit: Payer: Medicaid Other | Attending: Family Medicine

## 2021-03-12 DIAGNOSIS — M25571 Pain in right ankle and joints of right foot: Secondary | ICD-10-CM | POA: Diagnosis not present

## 2021-03-12 DIAGNOSIS — M542 Cervicalgia: Secondary | ICD-10-CM | POA: Diagnosis not present

## 2021-03-12 DIAGNOSIS — M545 Low back pain, unspecified: Secondary | ICD-10-CM | POA: Diagnosis not present

## 2021-03-12 DIAGNOSIS — R262 Difficulty in walking, not elsewhere classified: Secondary | ICD-10-CM | POA: Insufficient documentation

## 2021-03-12 DIAGNOSIS — R293 Abnormal posture: Secondary | ICD-10-CM | POA: Diagnosis not present

## 2021-03-12 DIAGNOSIS — M25572 Pain in left ankle and joints of left foot: Secondary | ICD-10-CM | POA: Diagnosis not present

## 2021-03-12 DIAGNOSIS — G8929 Other chronic pain: Secondary | ICD-10-CM | POA: Diagnosis not present

## 2021-03-12 NOTE — Therapy (Signed)
[place], [place], [address] Phone: [phone]   Fax:  [phone]  Physical Therapy Evaluation  Patient Details  Name: Maureen Lewis MRN: [id number] Date of Birth: 05-01-1996 Referring Provider (PT): Gregor Hams,   Encounter Date: 03/12/2021   PT End of Session - 03/12/21 1740    Visit Number 1    Number of Visits 17    Date for PT Re-Evaluation 05/11/21    Authorization Type Medicaid [employer]    Authorization Time Period requesting auth    PT Start Time 1705    PT Stop Time 1745    PT Time Calculation (min) 40 min    Activity Tolerance Patient tolerated treatment well    Behavior During Therapy [facility] for tasks assessed/performed           Past Medical History:  Diagnosis Date  . Abnormal uterine bleeding   . Acid reflux   . Alpha thalassemia trait   . Amenorrhea   . Anemia    no current med.  . Back pain   . Cesarean delivery delivered 10/11/2019   10/09/2019 - primary CS for failed IOL  . Chest pain    resolved  . Complication of anesthesia    states had to keep giving her anesthesia during EGD  . Constipation   . Depression    "I'm good"  . Fibromyalgia   . Finger mass, left 03/2017   middle finger  . Gestational diabetes 05/23/2019   hx - with pregnancy only   . Gestational hypertension 09/23/2019   Guidelines for Antenatal Testing and Sonography  (with updated ICD-10 codes)  Updated  09-09-2019 with Dr. Tama High  INDICATION U/S 2 X week NST/AFI  or full BPP wkly DELIVERY Diabetes   A1 - good control - O24.410    A2 - good control - O24.419      A2  - poor control or poor compliance - O24.419, E11.65   (Macrosomia or polyhydramnios) **E11.65 is extra code for poor control**    A2/B - O24.  Marland Kitchen Headache   . Hypertension   . Infection    UTI  . Irritable bowel syndrome (IBS)   . Joint pain   . Morbid obesity with body mass index (BMI) of 45.0 to 49.9 in adult  [facility])   . Obesity during pregnancy, antepartum 04/06/2019   Body mass index is 53.71 kg/m.  Recommendations [x]  Aspirin 81 mg daily after 12 weeks; discontinue after 36 weeks [ ]  Nutrition consult [ ]  Weight gain 11-20 lbs for singleton and 25-35 lbs for twin pregnancy (IOM guidelines) Higher class of obesity patients recommended to gain closer to lower limit  Weight loss is associated with adverse outcomes [ ]  Baseline and surveillance labs (pulled in fr  . Plantar fasciitis, bilateral    resolved  . Polycystic ovary syndrome   . Pre-diabetes    no meds, diet controlled, diet not check blood sugar  . Prediabetes    "prediabetes"  . Pregnancy induced hypertension   . Shortness of breath    albuterol inhaler  . Sleep apnea    no CPAP use  . Supervision of normal first pregnancy 04/06/2019   [name]: [ x]Initial [ x]12 [ ] 20 [ ] 28 [ ] 32 [ ] 36 [ ] 38 [ ] 39 [ ] 40 Nursing Staff Provider Office Location [facility]  Dating   LMP Language  English  Anatomy US  Nml Flu Vaccine  Declined 07/28/19 Genetic  Screen  NIPS: low risks   AFP:   negative  TDaP vaccine   08/25/19 Hgb A1C or  GTT Early A1C 5.8 Third trimester: GDM insulin Rhogam  NA   LAB RESULTS  Feeding Plan Breast Blood Type  . Vertigo 2017    Past Surgical History:  Procedure Laterality Date  . CESAREAN SECTION N/A 10/09/2019   Procedure: CESAREAN SECTION;  Surgeon: Aletha Halim, MD;  Location: [facility];  Service: Obstetrics;  Laterality: N/A;  . COLONOSCOPY WITH PROPOFOL  10/07/2016  . DILATION AND CURETTAGE OF UTERUS    . ESOPHAGOGASTRODUODENOSCOPY  12/21/2015  . EXCISION MASS UPPER EXTREMETIES Left 04/21/2017   Procedure: EXCISION MASS LEFT MIDDLE FINGER;  Surgeon: Daryll Brod, MD;  Location: [place];  Service: Orthopedics;  Laterality: Left;  . IR FL GUIDED LOC OF NEEDLE/CATH TIP FOR SPINAL INJECTION RT  03/13/2020  . PHOTOCOAGULATION WITH LASER Left 12/13/2020   Procedure: RETINOPLEXY LEFT EYE;  Surgeon:  Sherlynn Stalls, MD;  Location: [place];  Service: Ophthalmology;  Laterality: Left;  . SCLERAL BUCKLE Right 12/13/2020   Procedure: SCLERAL BUCKLE WITH CRYO;  Surgeon: Sherlynn Stalls, MD;  Location: [place];  Service: Ophthalmology;  Laterality: Right;    There were no vitals filed for this visit.    Subjective Assessment - 03/12/21 1707    Subjective Patient reports with neck, low back, and bilateral ankle pain. She has previous history of malleolar fracture on the Rt in September 2021 from a fall and reports her ankle was feeling pretty good, but then had a fall about 2 months ago where she fell down the stairs where her right leg was tucked underneath her. She reports immediate pain in her low back and ankle from this fall. She went to see her physician 2 days after the fall and was prescribed muscle relaxers and was given an ankle brace, but she hasn't been using the brace because it is too tight. She reports having low back pain prior to her fall, but it was bearable. Since the fall it has been unbearable pain. She reports occasional numbness/tingling along lateral aspect of BLE. Patient denies any changes in bowel/bladder or saddle parasthesia. She reports that with her neck she gets sharp pain with she turns and when she uses her arms she feels more tension in her shoulders. She reports her neck pain began a couple years ago without known cause that has steadily worsened. She reports occasional numbness/tingling about BUE along dorsal aspect. She has completed PT for all of these issues previously with good results, but her recent fall has flared everything up.    Pertinent History acid reflux, alpha thalassemia trait, anemia, back pain, depression, gestational diabetes, HA, HTN, UTI, IBS, obesity, bilat plantar fasciitis, OSA, vertigo    Limitations Standing;Walking;House hold activities;Lifting    How long can you sit comfortably? sitting is fine    How long can you stand comfortably? maybe about 5  minutes    How long can you walk comfortably? about 5 minutes    Patient Stated Goals not eliminate the pain, but get the pain to decrease and be able to do my daily living, walking/standing and pick my daughter up when standing and play with the kids to not get tired very quickly.    Currently in Pain? Yes    Pain Score 4     Pain Location Ankle    Pain Orientation Right;Posterior;Lateral    Pain Descriptors / Indicators Stabbing  Pain Type Chronic pain    Pain Onset More than a month ago    Pain Frequency Constant    Aggravating Factors  walking, standing    Pain Relieving Factors elevation, ice, rest    Effect of Pain on Daily Activities limits ability to stand and walk    Multiple Pain Sites Yes    Pain Score 6    Pain Location Back    Pain Orientation Lower    Pain Descriptors / Indicators --   pulling/twisting   Pain Type Chronic pain    Pain Radiating Towards intermittent N/T down BLE    Pain Onset More than a month ago    Pain Frequency Constant    Aggravating Factors  movement    Pain Relieving Factors heat    Pain Score 5    Pain Location --   upper traps   Pain Orientation Left;Right    Pain Descriptors / Indicators --   pulling   Pain Type Chronic pain    Pain Radiating Towards occasional N/T BUE    Pain Onset More than a month ago    Pain Frequency Intermittent    Aggravating Factors  repetitive movement of UE, cervical rotation    Pain Relieving Factors heat/ice, pain medication              [facility] PT Assessment - 03/12/21 0001      Assessment   Medical Diagnosis Acute back pain, unspecified back location, unspecified back pain laterality  M25.571 (ICD-10-CM) - Acute right ankle pain, Neck pain  M54.81 (ICD-10-CM) - Occipital neuralgia of left side    Referring Provider (PT) Gregor Hams,    Onset Date/Surgical Date --   recent worsening from fall 2 months ago   Hand Dominance Right    Next MD Visit 03/14/21    Prior Therapy yes      Balance Screen    Has the patient fallen in the past 6 months Yes    How many times? 2    Has the patient had a decrease in activity level because of a fear of falling?  Yes    Is the patient reluctant to leave their home because of a fear of falling?  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      Cognition   Overall Cognitive Status Within Functional Limits for tasks assessed      Observation/Other Assessments   Focus on Therapeutic Outcomes (FOTO)  NA - MCD      Sensation   Light Touch Not tested      Coordination   Gross Motor Movements are Fluid and Coordinated Yes      Posture/Postural Control   Posture Comments valgus bilateral knees, forward head, rounded shoulders      AROM   Overall AROM Comments Lumbar AROM WNL (pain at end range of all motions); Cervical AROM WNL (pain with cervical extension)    Right Ankle Dorsiflexion 4    Left Ankle Dorsiflexion 12      Strength   Overall Strength Comments gross BUE strength 5/5, though significant upper trap compensation present with  shoulder MMT bilaterally    Right Hip Flexion 5/5    Right Hip Extension 3+/5    Right Hip ABduction 4+/5    Left Hip Flexion 5/5    Left Hip Extension 3+/5    Left Hip ABduction 4/5    Right Knee Flexion 5/5    Right Knee Extension 5/5    Left Knee Flexion 5/5    Left Knee Extension 5/5    Right Ankle Dorsiflexion 5/5    Right Ankle Plantar Flexion 5/5    Right Ankle Inversion 4/5   pain   Right Ankle Eversion 5/5    Left Ankle Dorsiflexion 5/5    Left Ankle Plantar Flexion 5/5    Left Ankle Inversion 5/5    Left Ankle Eversion 5/5      Flexibility   Soft Tissue Assessment /Muscle Length yes     Hamstrings WNL bilaterally    Quadriceps tightness bilaterally      Special Tests   Other special tests (-) SLR bilaterally      Transfers   Five time sit to stand comments  17.39      Ambulation/Gait   Gait Comments limited pronation RLE, wide BOS, limited knee flexion RLE throughout gait cycle.      Balance   Balance Assessed Yes      Static Standing Balance   Static Standing - Comment/# of Minutes LLE 25 seconds RLE 10 sec                      Objective measurements completed on examination: See above findings.               PT Education - 03/12/21 1801    Education Details Eval findings, POC, aquatic PT.    Person(s) Educated Patient    Methods Explanation    Comprehension Verbalized understanding            PT Short Term Goals - 03/12/21 1837      PT SHORT TERM GOAL #1   Title Patient will be independent with initial HEP.    Baseline No time at eval to issue    Time 3    Period Weeks    Status New    Target Date 04/02/21      PT SHORT TERM GOAL #2   Title Patient will demonstrate at least 10 degrees of Rt ankle dorsiflexion AROM to improve gait mechanics.    Baseline 4    Time 4    Period Weeks    Status New    Target Date 04/09/21      PT SHORT TERM GOAL #3   Title Patient will demonstrate 5/5 Rt ankle inversion strength to improve stability with walking.    Baseline 4/5 with pain    Time 4    Period Weeks    Status New    Target Date 04/09/21      PT SHORT TERM GOAL #4   Title Patient will complete overhead lifting of light weight (<5 lbs) with proper muscle sequencing in order to avoid overuse of upper traps with UE activity.    Baseline significant upper trap compensation with UE MMT.    Time 4    Period Weeks    Status New    Target Date 04/09/21      PT SHORT TERM GOAL #5   Title Patient will squat 20 lbs without increased pain to improve ability to pick up her daughter in standing.  Baseline unable to pick up  daughter in standing.    Time 4    Period Weeks    Status New    Target Date 04/09/21             PT Long Term Goals - 03/12/21 1848      PT LONG TERM GOAL #1   Title Patient will be independent with advanced HEP to manage her chronic conditions.    Baseline no HEP.    Time 8    Period Weeks    Status New    Target Date 05/07/21      PT LONG TERM GOAL #2   Title Patient will complete 5 x STS in </=13 seconds for improve functional strength and to signify decreased fall risk.    Baseline 17.3 seconds    Time 8    Period Weeks    Status New    Target Date 05/07/21      PT LONG TERM GOAL #3   Title Patient will maintain Rt SLS for at least 20 seconds to improve stability when walking on uneven surfaces.    Baseline 15 seconds    Time 8    Period Weeks    Status New    Target Date 05/07/21      PT LONG TERM GOAL #4   Title Patient will report ability to tolerate walking/standing for at least 1 hour with pain less than 2/10 to improve tolerance to work activity.    Baseline 5 minutes, current pain 5-6/10    Time 8    Period Weeks    Status New    Target Date 05/07/21                  Plan - 03/12/21 1854    Clinical Impression Statement Patient is a 25 y/o female who presents to OPPT with chief complaint of chronic low back, bilateral ankle, and cervical pain that was recently worsened from a fall she sustained a few months ago where she fell down the stairs. Prior to this fall she felt that her pain was more manageable, but since her fall she has experienced more difficulty with walking, standing, and work specific activity due to her pain. She reports she has had low back and cervical pain for years without known cause,her Rt ankle pain is the result of a malleolar fracture she sustained in September 2021, and her Lt ankle pain has recently begun likely due to compensating for her Rt ankle pain. She has previously completed physical therapy for these conditions  reporting improvements in her function and pain. Overall she has good cervical and lumbar AROM reporting low back pain at end range of all planes of lumbar AROM and neck pain at end range of cervical extension. She has good BUE strength, though significant upper trap compensation present with shoulder MMT. She has pain and weakness with Rt ankle inversion MMT and has limited Rt ankle dorsiflexion. Her gait is antalgic and she has difficulty maintaining SLS on the RLE. She scores at an increaesd fall risk based upon 5 x STS. She will benefit from skilled PT to address above stated deficits in order to return optimal function and reduce her risk of future falls.    Personal Factors and Comorbidities Age;Comorbidity 3+;Fitness;Profession;Time since onset of injury/illness/exacerbation    Comorbidities alpha thalassemia trait, anemia, back pain, depression, gestational diabetes, obesity, bilat plantar fasciitis, OSA, vertigo    Examination-Activity Limitations Stand;Locomotion Level;Lift    Examination-Participation  Restrictions Occupation   playing with child   Stability/Clinical Decision Making Evolving/Moderate complexity    Clinical Decision Making High    Rehab Potential Fair    PT Frequency 2x / week    PT Duration 8 weeks    PT Treatment/Interventions Moist Heat;Gait training;Stair training;Functional mobility training;Therapeutic activities;Therapeutic exercise;Balance training;Manual techniques;Patient/family education;Passive range of motion;Dry needling;Taping;ADLs/Self Care Home Management;Aquatic Therapy;Cryotherapy;Neuromuscular re-education    PT Next Visit Plan Issue HEP, gait training, balance, general UE/LE strengthening    Consulted and Agree with Plan of Care Patient           Patient will benefit from skilled therapeutic intervention in order to improve the following deficits and impairments:  Difficulty walking,Abnormal gait,Decreased range of motion,Obesity,Pain,Postural  dysfunction  Visit Diagnosis: Chronic bilateral low back pain, unspecified whether sciatica present  Cervicalgia  Chronic pain of both ankles  Difficulty in walking, not elsewhere classified  Abnormal posture     Problem List Patient Active Problem List   Diagnosis Date Noted  . Levator spasm 11/21/2020  . Galactorrhea 11/19/2020  . Closed fracture of right ankle 09/18/2020  . Sprain of ankle 07/18/2020  . Acute hypoxemic respiratory failure due to COVID-19 ([place]) 06/11/2020  . Hx of migraine headaches 04/09/2020  . Postpartum care following cesarean delivery 11/22/2019  . Contraceptive management 11/22/2019  . Positive RPR test 10/09/2019  . HTN (hypertension) 09/23/2019  . Migraine headache 07/12/2019  . Alpha thalassemia trait   . Vitamin D deficiency 09/16/2017  . Prediabetes 09/16/2017  . Depression 09/16/2017  . Class 3 severe obesity with serious comorbidity and body mass index (BMI) of 50.0 to 59.9 in adult ([place]) 09/16/2017  . PCOS (polycystic ovarian syndrome) 08/13/2017  . Mass 05/06/2017  . Vertigo 10/06/2016  . Microcytic anemia 09/26/2016  . Pseudotumor cerebri syndrome 08/28/2016  . Obesity hypoventilation syndrome ([place]) 08/28/2016  . Benign paroxysmal positional vertigo due to bilateral vestibular disorder 08/28/2016  . OSA (obstructive sleep apnea) 08/28/2016  . Hypersomnia with sleep apnea 08/28/2016  . Female hirsutism 08/28/2016  . GERD (gastroesophageal reflux disease) 02/24/2015  . Constipation 10/02/2014   Check all possible CPT codes: 94765- Therapeutic Exercise, [phone]- Neuro Re-education, [phone] - Gait Training, [phone] - Manual Therapy, [phone] - Therapeutic Activities, [phone] - Self Care and [phone] - Aquatic therapy       Gwendolyn Grant, PT, DPT, ATC 03/12/21 7:20 PM  [facility] [address] [place], [place], [address] Phone: [phone]   Fax:  [phone]  Name: Maureen Vermeer  Lewis MRN: [id number] Date of Birth: Nov 08, 1995

## 2021-03-12 NOTE — Patient Instructions (Signed)
Aquatic Therapy at [facility]-  What to Expect!  Where:   [facility] [place], [place] [address] Rehab phone [phone]  NOTE:  You will receive an automated phone message reminding you of your appt and it will say the appointment is at the [facility] clinic.          How to Prepare: . Please make sure you drink 8 ounces of water about one hour prior to your pool session . A caregiver may attend if needed with the patient to help assist as needed. A caregiver can sit in the pool room on chair. Marland Kitchen Please arrive IN YOUR SUIT and 15 minutes prior to your appointment - this helps to avoid delays in starting your session. . Please make sure to attend to any toileting needs prior to entering the pool . [place] rooms for changing are provided.   There is direct access to the pool deck form the locker room.  You can lock your belongings in a locker with lock provided. . Once on the pool deck your therapist will ask if you have signed the Patient  Consent and Assignment of Benefits form before beginning treatment . Your therapist may take your blood pressure prior to, during and after your session if indicated . We usually try and create a home exercise program based on activities we do in the pool.  Please be thinking about who might be able to assist you in the pool should you need to participate in an aquatic home exercise program at the time of discharge if you need assistance.  Some patients do not want to or do not have the ability to participate in an aquatic home program - this is not a barrier in any way to you participating in aquatic therapy as part of your current therapy plan! . After Discharge from PT, you can continue using home program at  the [facility], there is a drop-in fee for $5 ($45 a month)or for 60 years  or older $4.00 ($40 a month for seniors ) or any local [employer] pool.  Memberships  for purchase are available for gym/pool at [facility]  It is very important that your last visit be in the clinic at [facility] street after your last aquatic visit.    Please make sure that you have a land/[address] on the calendar.   About the pool: 1. Pool is located 500 FT from the entrance of the building.  2. Entering the pool Your therapist will assist you; there are multiple ways to enter including stairs with railings, a walk in ramp, a roll in chair and a mechanical lift. Your therapist will determine the most appropriate way for you. 3. Water temperature is usually between 86-87 degrees 4. There may be other swimmers in the pool at the same time      Contact Info: For scheduling and cancellations        Appointments: Please call the [facility] if you need to cancel or reschedule an appointment at [facility]. Outpatient Rehabilitation @ [facility]         All sessions are 45 minutes [address] [place], [place] [address]

## 2021-03-13 NOTE — Progress Notes (Signed)
I, Wendy Poet, LAT, ATC, am serving as scribe for Dr. [name].  Maureen Lewis is a 25 y.o. female who presents to [place] at [facility] today for f/u of low back and chronic R ankle pain due to a malleolus fx from Sept 2021 that was re-irritated when she fell down some stairs on 01/09/21.  She was last seen by Dr. Georgina Snell on 02/14/21 and was referred to PT and advised to use an ASO brace for her ankle.  She was also prescribed Tizanidine for her low back.  She has completed 1 PT session.  Since her last visit, pt reports that she wants to f/u on her R ankle and to discuss new L ankle pain after rolling her L ankle about 1.5 weeks ago.  She states that her R ankle has been swelling more particularly at her R lateral ankle and lateral rearfoot and is unable to wear the ASO brace due to all the swelling.  She locates her L ankle pain to both her medial and lateral ankle.  Diagnostic testing: R ankle XR- 02/14/21; L-spine, T-spine and C-spine XR- 01/09/21   Pertinent review of systems: No fevers or chills  Relevant historical information: Morbid obesity   Exam:  BP (!) 160/100 (BP Location: Right Arm, Patient Position: Sitting, Cuff Size: Large)   Pulse 97   Ht 6\' 2"  (1.88 m)   Wt (!) 482 lb 12.8 oz (219 kg)   LMP 02/13/2021   SpO2 95%   BMI 61.99 kg/m  General: Well Developed, well nourished, and in no acute distress.   MSK: Right ankle mild effusion anterior lateral ankle.  Tender palpation ATFL mildly.  Normal ankle motion and strength.  Stable ligamentous exam.   Left ankle normal-appearing Normal motion. Mild tender palpation medial ankle and posterior tibialis tendon. Stable ligaments exam. Intact strength.    Lab and Radiology Results  X-ray images left ankle obtained today personally and independently interpreted No acute fractures. Await for radiology review  Procedure: Real-time Ultrasound Guided Injection of right ankle anterior  lateral joint line Device: Philips Affiniti 50G Images permanently stored and available for review in PACS Verbal informed consent obtained.  Discussed risks and benefits of procedure. Warned about infection bleeding damage to structures skin hypopigmentation and fat atrophy among others. Patient expresses understanding and agreement Time-out conducted.   Noted no overlying erythema, induration, or other signs of local infection.   Skin prepped in a sterile fashion.   Local anesthesia: Topical Ethyl chloride.   With sterile technique and under real time ultrasound guidance:  40 mg of Kenalog and 2 mL of Marcaine injected into ankle joint. Fluid seen entering the joint.   Completed without difficulty   Pain immediately resolved suggesting accurate placement of the medication.   Advised to call if fevers/chills, erythema, induration, drainage, or persistent bleeding.   Images permanently stored and available for review in the ultrasound unit.  Impression: Technically successful ultrasound guided injection.     Assessment and Plan: 25 y.o. female with right ankle pain due to persistent dysfunction following an injury last year with a posterior malleolus tiny fracture.  Patient had extensive evaluation and treatment.  At this point she has persistent pain and swelling in the actual ankle joint itself.  Discussed options.  Plan for trial of injection and reassess in about a month.  Left ankle new inversion injury.  X-ray obtained today no fractures.  Already obtaining physical therapy.  We will add  this injury to her current physical therapy.  Recheck in 1 month.   PDMP not reviewed this encounter. Orders Placed This Encounter  Procedures  . [name] LIMITED JOINT SPACE STRUCTURES LOW BILAT(NO LINKED CHARGES)    Order Specific Question:   Reason for Exam (SYMPTOM  OR DIAGNOSIS REQUIRED)    Answer:   R ankle pain    Order Specific Question:   Preferred imaging location?    Answer:   [place]  . DG Ankle Complete Left    Standing Status:   Future    Number of Occurrences:   1    Standing Expiration Date:   03/14/2022    Order Specific Question:   Reason for Exam (SYMPTOM  OR DIAGNOSIS REQUIRED)    Answer:   eval ankle pain    Order Specific Question:   Is patient pregnant?    Answer:   No    Order Specific Question:   Preferred imaging location?    Answer:   [facility]  . Ambulatory referral to Physical Therapy    Referral Priority:   Routine    Referral Type:   Physical Medicine    Referral Reason:   Specialty Services Required    Requested Specialty:   Physical Therapy   No orders of the defined types were placed in this encounter.    Discussed warning signs or symptoms. Please see discharge instructions. Patient expresses understanding.   The above documentation has been reviewed and is accurate and complete [name], M.D.

## 2021-03-14 ENCOUNTER — Encounter: Payer: Self-pay | Admitting: Family Medicine

## 2021-03-14 ENCOUNTER — Ambulatory Visit (INDEPENDENT_AMBULATORY_CARE_PROVIDER_SITE_OTHER): Payer: Medicaid Other | Admitting: Family Medicine

## 2021-03-14 ENCOUNTER — Ambulatory Visit: Payer: Self-pay

## 2021-03-14 ENCOUNTER — Ambulatory Visit (INDEPENDENT_AMBULATORY_CARE_PROVIDER_SITE_OTHER): Payer: Medicaid Other

## 2021-03-14 ENCOUNTER — Other Ambulatory Visit: Payer: Self-pay

## 2021-03-14 ENCOUNTER — Telehealth: Payer: Self-pay | Admitting: Family Medicine

## 2021-03-14 VITALS — BP 160/100 | HR 97 | Ht 74.0 in | Wt >= 6400 oz

## 2021-03-14 DIAGNOSIS — M25571 Pain in right ankle and joints of right foot: Secondary | ICD-10-CM

## 2021-03-14 DIAGNOSIS — M25572 Pain in left ankle and joints of left foot: Secondary | ICD-10-CM

## 2021-03-14 NOTE — Patient Instructions (Signed)
Thank you for coming in today.   Please get an Xray today before you leave   Call or go to the ER if you develop a large red swollen joint with extreme pain or oozing puss.    I've referred you to Physical Therapy.  Let us know if you don't hear from them in one week.   Recheck in 1 month.    

## 2021-03-14 NOTE — Telephone Encounter (Signed)
Maureen Lewis called saying that she was itching after the injection.  She is feeling well otherwise.  Advised taking Zyrtec twice daily methods not sufficient taking 2 Benadryl's.  Call back if not better.  Precautions reviewed.

## 2021-03-15 ENCOUNTER — Telehealth: Payer: Self-pay | Admitting: Family Medicine

## 2021-03-15 ENCOUNTER — Ambulatory Visit: Payer: Medicaid Other | Admitting: Family Medicine

## 2021-03-15 NOTE — Telephone Encounter (Signed)
Pt needs changes to her accommodation ppwk recently done by Dr. Georgina Snell.  Needs to state that she be able to sit on her stool for her whole shift  Cannot wear Crocs due to dress code, but they work for her due to R ankle swelling. Need to add the wearing of Crocs or provide alternative recommendation to avoid the swelling.  Should she be using her cam walker again?

## 2021-03-19 ENCOUNTER — Ambulatory Visit: Payer: Medicaid Other | Admitting: Family Medicine

## 2021-03-19 NOTE — Progress Notes (Signed)
Left ankle x-ray looks normal to radiology.

## 2021-03-20 ENCOUNTER — Encounter: Payer: Self-pay | Admitting: Adult Health

## 2021-03-20 ENCOUNTER — Other Ambulatory Visit: Payer: Self-pay

## 2021-03-20 ENCOUNTER — Ambulatory Visit (INDEPENDENT_AMBULATORY_CARE_PROVIDER_SITE_OTHER): Payer: Medicaid Other | Admitting: Adult Health

## 2021-03-20 ENCOUNTER — Ambulatory Visit: Payer: Medicaid Other | Admitting: Physical Therapy

## 2021-03-20 VITALS — BP 140/80 | HR 93 | Temp 98.4°F | Ht 73.0 in | Wt >= 6400 oz

## 2021-03-20 DIAGNOSIS — Z6841 Body Mass Index (BMI) 40.0 and over, adult: Secondary | ICD-10-CM

## 2021-03-20 DIAGNOSIS — E559 Vitamin D deficiency, unspecified: Secondary | ICD-10-CM

## 2021-03-20 DIAGNOSIS — D509 Iron deficiency anemia, unspecified: Secondary | ICD-10-CM | POA: Diagnosis not present

## 2021-03-20 DIAGNOSIS — Z0000 Encounter for general adult medical examination without abnormal findings: Secondary | ICD-10-CM | POA: Diagnosis not present

## 2021-03-20 DIAGNOSIS — I10 Essential (primary) hypertension: Secondary | ICD-10-CM

## 2021-03-20 DIAGNOSIS — G43809 Other migraine, not intractable, without status migrainosus: Secondary | ICD-10-CM

## 2021-03-20 LAB — CBC WITH DIFFERENTIAL/PLATELET
Basophils Absolute: 0 10*3/uL (ref 0.0–0.1)
Basophils Relative: 0.4 % (ref 0.0–3.0)
Eosinophils Absolute: 0 10*3/uL (ref 0.0–0.7)
Eosinophils Relative: 0.6 % (ref 0.0–5.0)
HCT: 39.3 % (ref 36.0–46.0)
Hemoglobin: 12.6 g/dL (ref 12.0–15.0)
Lymphocytes Relative: 28.7 % (ref 12.0–46.0)
Lymphs Abs: 2.2 10*3/uL (ref 0.7–4.0)
MCHC: 32 g/dL (ref 30.0–36.0)
MCV: 71.8 fl — ABNORMAL LOW (ref 78.0–100.0)
Monocytes Absolute: 0.6 10*3/uL (ref 0.1–1.0)
Monocytes Relative: 7.5 % (ref 3.0–12.0)
Neutro Abs: 4.8 10*3/uL (ref 1.4–7.7)
Neutrophils Relative %: 62.8 % (ref 43.0–77.0)
Platelets: 273 10*3/uL (ref 150.0–400.0)
RBC: 5.47 Mil/uL — ABNORMAL HIGH (ref 3.87–5.11)
RDW: 16.3 % — ABNORMAL HIGH (ref 11.5–15.5)
WBC: 7.6 10*3/uL (ref 4.0–10.5)

## 2021-03-20 LAB — VITAMIN D 25 HYDROXY (VIT D DEFICIENCY, FRACTURES): VITD: 10.82 ng/mL — ABNORMAL LOW (ref 30.00–100.00)

## 2021-03-20 LAB — TSH: TSH: 1.29 u[IU]/mL (ref 0.35–4.50)

## 2021-03-20 NOTE — Telephone Encounter (Signed)
Form completed will be faxed today

## 2021-03-20 NOTE — Progress Notes (Signed)
Subjective:    Patient ID: Maureen Lewis, female    DOB: [birth year], 25 y.o.   MRN: [id number]  HPI Patient presents for yearly preventative medicine examination. 25 year old female who  has a past medical history of Abnormal uterine bleeding, Acid reflux, Alpha thalassemia trait, Amenorrhea, Anemia, Back pain, Cesarean delivery delivered (10/11/2019), Chest pain, Complication of anesthesia, Constipation, Depression, Fibromyalgia, Finger mass, left (03/2017), Gestational diabetes (05/23/2019), Gestational hypertension (09/23/2019), Headache, Hypertension, Infection, Irritable bowel syndrome (IBS), Joint pain, Morbid obesity with body mass index (BMI) of 45.0 to 49.9 in adult [facility]), Obesity during pregnancy, antepartum (04/06/2019), Plantar fasciitis, bilateral, Polycystic ovary syndrome, Pre-diabetes, Prediabetes, Pregnancy induced hypertension, Shortness of breath, Sleep apnea, Supervision of normal first pregnancy (04/06/2019), and Vertigo (2017).  Hypertension - Prescribed Norvasc 10 mg and HCTZ 25 mg  BP Readings from Last 3 Encounters:  03/20/21 140/80  03/14/21 (!) 160/100  03/01/21 (!) 154/86   Microcystic anemia - takes PO iron supplementation. She continues to report fatigue   Migraine Headaches - followed by Neurology. Currently prescribed Topamax 25 mg and Elavil 50 mg QHS. Reports that this works well for her.   Obesity. Other - is seen by [facility]. Currently prescribed Victoza but is not losing weight. She does not want weight loss surgery but does want to weight her options and is interested in seeing a bariatric surgeon. Has not been seen by [facility] since March.   Wt Readings from Last 3 Encounters:  03/20/21 (!) 482 lb (218.6 kg)  03/14/21 (!) 482 lb 12.8 oz (219 kg)  03/01/21 (!) 487 lb 3.2 oz (221 kg)     Vitamin D Def- takes 50,000 units weekly.  Last vitamin D Lab Results  Component Value Date   VD25OH 15.7 (L)  01/14/2021    All immunizations and health maintenance protocols were reviewed with the patient and needed orders were placed.  Appropriate screening laboratory values were ordered for the patient including screening of hyperlipidemia, renal function and hepatic function.  Medication reconciliation,  past medical history, social history, problem list and allergies were reviewed in detail with the patient  Goals were established with regard to weight loss, exercise, and  diet in compliance with medications   Review of Systems  Constitutional: Negative.   HENT: Negative.   Eyes: Negative.   Respiratory: Negative.   Cardiovascular: Negative.   Gastrointestinal: Negative.   Endocrine: Negative.   Genitourinary: Negative.   Musculoskeletal: Negative.   Skin: Negative.   Allergic/Immunologic: Negative.   Neurological: Negative.   Hematological: Negative.   Psychiatric/Behavioral: Negative.    Past Medical History:  Diagnosis Date  . Abnormal uterine bleeding   . Acid reflux   . Alpha thalassemia trait   . Amenorrhea   . Anemia    no current med.  . Back pain   . Cesarean delivery delivered 10/11/2019   10/09/2019 - primary CS for failed IOL  . Chest pain    resolved  . Complication of anesthesia    states had to keep giving her anesthesia during EGD  . Constipation   . Depression    "I'm good"  . Fibromyalgia   . Finger mass, left 03/2017   middle finger  . Gestational diabetes 05/23/2019   hx - with pregnancy only   . Gestational hypertension 09/23/2019   Guidelines for Antenatal Testing and Sonography  (with updated ICD-10 codes)  Updated  09/25/2019 with Dr. Tama High  INDICATION U/S  2 X week NST/AFI  or full BPP wkly DELIVERY Diabetes   A1 - good control - O24.410    A2 - good control - O24.419      A2  - poor control or poor compliance - O24.419, E11.65   (Macrosomia or polyhydramnios) **E11.65 is extra code for poor control**    A2/B - O24.  Marland Kitchen Headache   .  Hypertension   . Infection    UTI  . Irritable bowel syndrome (IBS)   . Joint pain   . Morbid obesity with body mass index (BMI) of 45.0 to 49.9 in adult [facility])   . Obesity during pregnancy, antepartum 04/06/2019   Body mass index is 53.71 kg/m.  Recommendations [x]  Aspirin 81 mg daily after 12 weeks; discontinue after 36 weeks [ ]  Nutrition consult [ ]  Weight gain 11-20 lbs for singleton and 25-35 lbs for twin pregnancy (IOM guidelines) Higher class of obesity patients recommended to gain closer to lower limit  Weight loss is associated with adverse outcomes [ ]  Baseline and surveillance labs (pulled in fr  . Plantar fasciitis, bilateral    resolved  . Polycystic ovary syndrome   . Pre-diabetes    no meds, diet controlled, diet not check blood sugar  . Prediabetes    "prediabetes"  . Pregnancy induced hypertension   . Shortness of breath    albuterol inhaler  . Sleep apnea    no CPAP use  . Supervision of normal first pregnancy 04/06/2019   [name]: [ x]Initial [ x]12 [ ] 20 [ ] 28 [ ] 32 [ ] 36 [ ] 38 [ ] 39 [ ] 40 Nursing Staff Provider Office Location [facility]  Dating   LMP Language  English  Anatomy US  Nml Flu Vaccine  Declined 07/28/19 Genetic Screen  NIPS: low risks   AFP:   negative  TDaP vaccine   08/25/19 Hgb A1C or  GTT Early A1C 5.8 Third trimester: GDM insulin Rhogam  NA   LAB RESULTS  Feeding Plan Breast Blood Type  . Vertigo 2017    Social History   Socioeconomic History  . Marital status: Single    Spouse name: Not on file  . Number of children: Not on file  . Years of education: Not on file  . Highest education level: Not on file  Occupational History  . Occupation: Chemical engineer: [id number]  Tobacco Use  . Smoking status: Never Smoker  . Smokeless tobacco: Never Used  Vaping Use  . Vaping Use: Never used  Substance and Sexual Activity  . Alcohol use: Not Currently    Comment: social   . Drug use: No  . Sexual activity: Yes    Birth  control/protection: Condom  Other Topics Concern  . Not on file  Social History Narrative   Right handed    Soda sometimes   Lives with mom and cousin, a grandmother in [place] also helps care for her.       She is in nursing school.    Social Determinants of Health   Financial Resource Strain: Not on file  Food Insecurity: Not on file  Transportation Needs: Not on file  Physical Activity: Not on file  Stress: Not on file  Social Connections: Not on file  Intimate Partner Violence: Not on file    Past Surgical History:  Procedure Laterality Date  . CESAREAN SECTION N/A 10/09/2019   Procedure: CESAREAN SECTION;  Surgeon: Aletha Halim, MD;  Location: [facility];  Service: Obstetrics;  Laterality: N/A;  . COLONOSCOPY WITH PROPOFOL  10/07/2016  . DILATION AND CURETTAGE OF UTERUS    . ESOPHAGOGASTRODUODENOSCOPY  12/21/2015  . EXCISION MASS UPPER EXTREMETIES Left 04/21/2017   Procedure: EXCISION MASS LEFT MIDDLE FINGER;  Surgeon: Daryll Brod, MD;  Location: [place];  Service: Orthopedics;  Laterality: Left;  . IR FL GUIDED LOC OF NEEDLE/CATH TIP FOR SPINAL INJECTION RT  03/13/2020  . PHOTOCOAGULATION WITH LASER Left 12/13/2020   Procedure: RETINOPLEXY LEFT EYE;  Surgeon: Sherlynn Stalls, MD;  Location: [place];  Service: Ophthalmology;  Laterality: Left;  . SCLERAL BUCKLE Right 12/13/2020   Procedure: SCLERAL BUCKLE WITH CRYO;  Surgeon: Sherlynn Stalls, MD;  Location: [place];  Service: Ophthalmology;  Laterality: Right;    Family History  Problem Relation Age of Onset  . Diabetes Maternal Aunt   . Hypertension Maternal Uncle   . Depression Maternal Grandfather   . Diabetes Maternal Grandfather   . Hypertension Maternal Grandfather   . Arthritis Mother   . High blood pressure Mother   . Depression Mother   . Sleep apnea Mother   . Obesity Mother   . Diabetes Mother   . Hypertension Mother   . Kidney failure Mother   . Sleep apnea Father   . Obesity Father    . Healthy Daughter     Allergies  Allergen Reactions  . Bee Pollen Hives, Shortness Of Breath and Swelling  . Bee Venom Hives, Shortness Of Breath and Swelling  . Hydrocodone-Acetaminophen Anaphylaxis    No problem when she takes Tylenol  . Peach Flavor Hives, Shortness Of Breath and Other (See Comments)    SWELLING OF MOUTH  . Pollen Extract Hives, Shortness Of Breath and Swelling  . Remdesivir Swelling    Angioedema 8/25  . Cortizone-10 [Hydrocortisone]     Swelling and itching    Current Outpatient Medications on File Prior to Visit  Medication Sig Dispense Refill  . acetaZOLAMIDE (DIAMOX) 500 MG capsule Take 1 capsule (500 mg total) by mouth 2 (two) times daily. For headache 60 capsule 2  . albuterol (VENTOLIN HFA) 108 (90 Base) MCG/ACT inhaler Inhale 2 puffs into the lungs every 6 (six) hours as needed for wheezing or shortness of breath. 8 g 0  . amLODipine (NORVASC) 10 MG tablet Take 1 tablet (10 mg total) by mouth daily. 90 tablet 3  . diclofenac (VOLTAREN) 75 MG EC tablet Take 1 tablet (75 mg total) by mouth 2 (two) times daily. 60 tablet 1  . hydrochlorothiazide (HYDRODIURIL) 25 MG tablet Take 1 tablet (25 mg total) by mouth daily. 90 tablet 3  . ibuprofen (ADVIL) 800 MG tablet Take 800 mg by mouth every 8 (eight) hours as needed.    . liraglutide (VICTOZA) 18 MG/3ML SOPN Inject 1.2 mg into the skin daily. 1.2 6 mL 0  . loratadine (CLARITIN) 10 MG tablet Take 10 mg by mouth daily as needed for allergies.     Marland Kitchen oxyCODONE-acetaminophen (PERCOCET/ROXICET) 5-325 MG tablet Take 1-2 tablets by mouth every 6 (six) hours as needed for severe pain. (Patient taking differently: Take 1 tablet by mouth 5 (five) times daily.) 20 tablet 0  . polyethylene glycol powder (GLYCOLAX/MIRALAX) 17 GM/SCOOP powder Take 17 g by mouth daily as needed for moderate constipation.     Marland Kitchen tiZANidine (ZANAFLEX) 4 MG tablet Take 1 tablet (4 mg total) by mouth every 6 (six) hours as needed for muscle spasms.  30 tablet 2  . Vitamin D, Ergocalciferol, (DRISDOL)  1.25 MG (50000 UNIT) CAPS capsule Take 1 capsule (50,000 Units total) by mouth every 7 (seven) days. 4 capsule 0  . ferrous sulfate 325 (65 FE) MG tablet Take 1 tablet (325 mg total) by mouth every other day. (Patient taking differently: Take 325 mg by mouth every Monday, Wednesday, and Friday.) 45 tablet 1  . gabapentin (NEURONTIN) 100 MG capsule Take 100 mg by mouth at bedtime. (Patient not taking: Reported on 03/20/2021)    . gabapentin (NEURONTIN) 300 MG capsule Take 300 mg by mouth 3 (three) times daily. (Patient not taking: Reported on 03/20/2021)     No current facility-administered medications on file prior to visit.    BP 140/80   Pulse 93   Temp 98.4 F (36.9 C) (Oral)   Ht 6\' 1"  (1.854 m)   Wt (!) 482 lb (218.6 kg)   LMP 01/19/2021   SpO2 98%   BMI 63.59 kg/m       Objective:   Physical Exam Vitals and nursing note reviewed.  Constitutional:      Appearance: Normal appearance. She is obese.  HENT:     Head: Normocephalic and atraumatic.     Right Ear: Tympanic membrane normal.     Left Ear: Tympanic membrane normal.     Nose: Nose normal.  Eyes:     Extraocular Movements: Extraocular movements intact.     Pupils: Pupils are equal, round, and reactive to light.  Cardiovascular:     Rate and Rhythm: Normal rate and regular rhythm.     Pulses: Normal pulses.     Heart sounds: Normal heart sounds.  Pulmonary:     Effort: Pulmonary effort is normal.     Breath sounds: Normal breath sounds.  Abdominal:     General: Abdomen is flat. Bowel sounds are normal.     Palpations: Abdomen is soft.  Musculoskeletal:        General: Normal range of motion.  Skin:    General: Skin is warm and dry.     Capillary Refill: Capillary refill takes less than 2 seconds.  Neurological:     General: No focal deficit present.     Mental Status: She is alert and oriented to person, place, and time.  Psychiatric:        Mood and  Affect: Mood normal.        Behavior: Behavior normal.        Thought Content: Thought content normal.        Judgment: Judgment normal.           Assessment & Plan:  1. Routine general medical examination at a health care facility  - CBC with Differential/Platelet - Comprehensive metabolic panel - TSH  2. Class 3 severe obesity due to excess calories with serious comorbidity and body mass index (BMI) of 50.0 to 59.9 in adult [facility]) - Will refer to [facility] for bariatric surgery evaluation  - Ambulatory referral to General Surgery - CBC with Differential/Platelet - Comprehensive metabolic panel - TSH  3. Essential hypertension - Improved now that she is taking her medication on a daily basis  - CBC with Differential/Platelet - Comprehensive metabolic panel - TSH  4. Microcytic anemia - Continue PO iron  - CBC with Differential/Platelet - Comprehensive metabolic panel - TSH  5. Other migraine without status migrainosus, not intractable - Continue with current therapy   6. Vitamin D deficiency  - Vitamin D, 25-hydroxy  Dorothyann Peng, NP

## 2021-03-21 DIAGNOSIS — E8881 Metabolic syndrome: Secondary | ICD-10-CM | POA: Diagnosis not present

## 2021-03-21 DIAGNOSIS — Z20822 Contact with and (suspected) exposure to covid-19: Secondary | ICD-10-CM | POA: Diagnosis not present

## 2021-03-21 DIAGNOSIS — Z131 Encounter for screening for diabetes mellitus: Secondary | ICD-10-CM | POA: Diagnosis not present

## 2021-03-21 DIAGNOSIS — M25571 Pain in right ankle and joints of right foot: Secondary | ICD-10-CM | POA: Diagnosis not present

## 2021-03-21 DIAGNOSIS — N915 Oligomenorrhea, unspecified: Secondary | ICD-10-CM | POA: Diagnosis not present

## 2021-03-21 DIAGNOSIS — Z79899 Other long term (current) drug therapy: Secondary | ICD-10-CM | POA: Diagnosis not present

## 2021-03-21 DIAGNOSIS — R5383 Other fatigue: Secondary | ICD-10-CM | POA: Diagnosis not present

## 2021-03-21 DIAGNOSIS — E349 Endocrine disorder, unspecified: Secondary | ICD-10-CM | POA: Diagnosis not present

## 2021-03-21 DIAGNOSIS — E7800 Pure hypercholesterolemia, unspecified: Secondary | ICD-10-CM | POA: Diagnosis not present

## 2021-03-21 DIAGNOSIS — M542 Cervicalgia: Secondary | ICD-10-CM | POA: Diagnosis not present

## 2021-03-21 DIAGNOSIS — R0602 Shortness of breath: Secondary | ICD-10-CM | POA: Diagnosis not present

## 2021-03-21 DIAGNOSIS — M419 Scoliosis, unspecified: Secondary | ICD-10-CM | POA: Diagnosis not present

## 2021-03-21 DIAGNOSIS — Z6841 Body Mass Index (BMI) 40.0 and over, adult: Secondary | ICD-10-CM | POA: Diagnosis not present

## 2021-03-21 DIAGNOSIS — M545 Low back pain, unspecified: Secondary | ICD-10-CM | POA: Diagnosis not present

## 2021-03-21 DIAGNOSIS — D539 Nutritional anemia, unspecified: Secondary | ICD-10-CM | POA: Diagnosis not present

## 2021-03-22 LAB — COMPREHENSIVE METABOLIC PANEL
ALT: 18 U/L (ref 0–35)
AST: 19 U/L (ref 0–37)
Albumin: 4.2 g/dL (ref 3.5–5.2)
Alkaline Phosphatase: 110 U/L (ref 39–117)
BUN: 10 mg/dL (ref 6–23)
CO2: 20 mEq/L (ref 19–32)
Calcium: 9.5 mg/dL (ref 8.4–10.5)
Chloride: 107 mEq/L (ref 96–112)
Creatinine, Ser: 0.59 mg/dL (ref 0.40–1.20)
GFR: 127.7 mL/min (ref >60.00)
Glucose, Bld: 91 mg/dL (ref 70–99)
Potassium: 4.3 mEq/L (ref 3.5–5.1)
Sodium: 145 mEq/L (ref 135–145)
Total Bilirubin: 0.4 mg/dL (ref 0.2–1.2)
Total Protein: 7.4 g/dL (ref 6.0–8.3)

## 2021-03-25 DIAGNOSIS — Z79899 Other long term (current) drug therapy: Secondary | ICD-10-CM | POA: Diagnosis not present

## 2021-03-26 ENCOUNTER — Ambulatory Visit (INDEPENDENT_AMBULATORY_CARE_PROVIDER_SITE_OTHER): Payer: Medicaid Other | Admitting: Obstetrics and Gynecology

## 2021-03-26 ENCOUNTER — Other Ambulatory Visit: Payer: Self-pay

## 2021-03-26 ENCOUNTER — Ambulatory Visit: Payer: Medicaid Other | Admitting: Adult Health

## 2021-03-26 ENCOUNTER — Encounter: Payer: Self-pay | Admitting: Obstetrics and Gynecology

## 2021-03-26 DIAGNOSIS — N938 Other specified abnormal uterine and vaginal bleeding: Secondary | ICD-10-CM | POA: Diagnosis not present

## 2021-03-26 NOTE — Progress Notes (Signed)
Patient ID: Maureen Lewis, female   DOB: [birth year], 25 y.o.   MRN: [id number] Ms Deschene presents with c/o heavy irregular cycles. Has skipped 2-3 months at a time. She also reports some upper abd pain. Denies any bowel or bladder dysfunction. Not sexual active. H/O c section x 1 Has tried OCP's in the past did not help with cycles Had Kayla IUD in the past, removed due to malpresentation. This was prior to pregnancy GYN U/S 12/21 normal Pap smear UTD  PE AF VSS Lungs clear Heart RRR Abd soft + BS obese  A/P DUB        Menorrhagia  Discussed with pt tx options for her bleeding. I think she would benefit from an IUD. Reviewed with pt. Information provided. Pt will return next week for insertion

## 2021-03-26 NOTE — Patient Instructions (Signed)
Levonorgestrel intrauterine device (IUD) What is this medicine? LEVONORGESTREL IUD (LEE voe nor jes trel) is a contraceptive (birth control) device. The device is placed inside the uterus by a health care provider. It is used to prevent pregnancy. Some devices can also be used to treat heavy bleeding that occurs during your period. This medicine may be used for other purposes; ask your health care provider or pharmacist if you have questions. COMMON BRAND NAME(S): Kyleena, LILETTA, Mirena, Skyla What should I tell my health care provider before I take this medicine? They need to know if you have any of these conditions:  abnormal Pap smear  cancer of the breast, uterus, or cervix  diabetes  endometritis  genital or pelvic infection now or in the past  have more than one sexual partner or your partner has more than one partner  heart disease  history of an ectopic or tubal pregnancy  immune system problems  IUD in place  liver disease or tumor  problems with blood clots or take blood-thinners  seizures  use intravenous drugs  uterus of unusual shape  vaginal bleeding that has not been explained  an unusual or allergic reaction to levonorgestrel, other hormones, silicone, or polyethylene, medicines, foods, dyes, or preservatives  pregnant or trying to get pregnant  breast-feeding How should I use this medicine? This device is placed inside the uterus by a health care professional. Talk to your pediatrician regarding the use of this medicine in children. Special care may be needed. Overdosage: If you think you have taken too much of this medicine contact a poison control center or emergency room at once. NOTE: This medicine is only for you. Do not share this medicine with others. What if I miss a dose? This does not apply. Depending on the brand of device you have inserted, the device will need to be replaced every 3 to 7 years if you wish to continue using this type  of birth control. What may interact with this medicine? Do not take this medicine with any of the following medications:  amprenavir  bosentan  fosamprenavir This medicine may also interact with the following medications:  aprepitant  armodafinil  barbiturate medicines for inducing sleep or treating seizures  bexarotene  boceprevir  griseofulvin  medicines to treat seizures like carbamazepine, ethotoin, felbamate, oxcarbazepine, phenytoin, topiramate  modafinil  pioglitazone  rifabutin  rifampin  rifapentine  some medicines to treat HIV infection like atazanavir, efavirenz, indinavir, lopinavir, nelfinavir, tipranavir, ritonavir  St. John's wort  warfarin This list may not describe all possible interactions. Give your health care provider a list of all the medicines, herbs, non-prescription drugs, or dietary supplements you use. Also tell them if you smoke, drink alcohol, or use illegal drugs. Some items may interact with your medicine. What should I watch for while using this medicine? Visit your doctor or health care professional for regular check ups. See your doctor if you or your partner has sexual contact with others, becomes HIV positive, or gets a sexual transmitted disease. This product does not protect you against HIV infection (AIDS) or other sexually transmitted diseases. You can check the placement of the IUD yourself by reaching up to the top of your vagina with clean fingers to feel the threads. Do not pull on the threads. It is a good habit to check placement after each menstrual period. Call your doctor right away if you feel more of the IUD than just the threads or if you cannot feel the threads   at all. The IUD may come out by itself. You may become pregnant if the device comes out. If you notice that the IUD has come out use a backup birth control method like condoms and call your health care provider. Using tampons will not change the position of the  IUD and are okay to use during your period. This IUD can be safely scanned with magnetic resonance imaging (MRI) only under specific conditions. Before you have an MRI, tell your healthcare provider that you have an IUD in place, and which type of IUD you have in place. What side effects may I notice from receiving this medicine? Side effects that you should report to your doctor or health care professional as soon as possible:  allergic reactions like skin rash, itching or hives, swelling of the face, lips, or tongue  fever, flu-like symptoms  genital sores  high blood pressure  no menstrual period for 6 weeks during use  pain, swelling, warmth in the leg  pelvic pain or tenderness  severe or sudden headache  signs of pregnancy  stomach cramping  sudden shortness of breath  trouble with balance, talking, or walking  unusual vaginal bleeding, discharge  yellowing of the eyes or skin Side effects that usually do not require medical attention (report to your doctor or health care professional if they continue or are bothersome):  acne  breast pain  change in sex drive or performance  changes in weight  cramping, dizziness, or faintness while the device is being inserted  headache  irregular menstrual bleeding within first 3 to 6 months of use  nausea This list may not describe all possible side effects. Call your doctor for medical advice about side effects. You may report side effects to FDA at 1-800-FDA-1088. Where should I keep my medicine? This does not apply. NOTE: This sheet is a summary. It may not cover all possible information. If you have questions about this medicine, talk to your doctor, pharmacist, or health care provider.  2021 Elsevier/Gold Standard (2020-06-05 16:27:45)  

## 2021-03-26 NOTE — Progress Notes (Signed)
Pt states she is having upper and  lower abd pain x 1-2 month, heavy cycles- lasting up to 2 weeks- not on birth control.

## 2021-03-27 ENCOUNTER — Ambulatory Visit: Payer: Medicaid Other | Attending: Adult Health

## 2021-03-27 DIAGNOSIS — S82891A Other fracture of right lower leg, initial encounter for closed fracture: Secondary | ICD-10-CM | POA: Diagnosis not present

## 2021-03-27 DIAGNOSIS — M25571 Pain in right ankle and joints of right foot: Secondary | ICD-10-CM | POA: Diagnosis not present

## 2021-03-27 DIAGNOSIS — M542 Cervicalgia: Secondary | ICD-10-CM

## 2021-03-27 DIAGNOSIS — G8929 Other chronic pain: Secondary | ICD-10-CM | POA: Diagnosis not present

## 2021-03-27 DIAGNOSIS — M545 Low back pain, unspecified: Secondary | ICD-10-CM | POA: Diagnosis not present

## 2021-03-27 DIAGNOSIS — R293 Abnormal posture: Secondary | ICD-10-CM | POA: Insufficient documentation

## 2021-03-27 DIAGNOSIS — M25572 Pain in left ankle and joints of left foot: Secondary | ICD-10-CM | POA: Insufficient documentation

## 2021-03-27 DIAGNOSIS — M25671 Stiffness of right ankle, not elsewhere classified: Secondary | ICD-10-CM | POA: Diagnosis not present

## 2021-03-27 DIAGNOSIS — R262 Difficulty in walking, not elsewhere classified: Secondary | ICD-10-CM | POA: Insufficient documentation

## 2021-03-27 NOTE — Therapy (Signed)
[place], [place], [address] Phone: [phone]   Fax:  [phone]  Physical Therapy Treatment  Patient Details  Name: Maureen Lewis MRN: [id number] Date of Birth: March 26, 1996 Referring Provider (PT): Gregor Hams,   Encounter Date: 03/27/2021   PT End of Session - 03/27/21 1747    Visit Number 2    Number of Visits 17    Date for PT Re-Evaluation 05/11/21    Authorization Type Medicaid [employer]    Authorization Time Period requesting auth    PT Start Time 1747    PT Stop Time 1830    PT Time Calculation (min) 43 min    Activity Tolerance Patient tolerated treatment well    Behavior During Therapy [facility] for tasks assessed/performed           Past Medical History:  Diagnosis Date  . Abnormal uterine bleeding   . Acid reflux   . Alpha thalassemia trait   . Amenorrhea   . Anemia    no current med.  . Back pain   . Cesarean delivery delivered 10/11/2019   10/09/2019 - primary CS for failed IOL  . Chest pain    resolved  . Complication of anesthesia    states had to keep giving her anesthesia during EGD  . Constipation   . Depression    "I'm good"  . Fibromyalgia   . Finger mass, left 03/2017   middle finger  . Gestational diabetes 05/23/2019   hx - with pregnancy only   . Gestational hypertension 09/23/2019   Guidelines for Antenatal Testing and Sonography  (with updated ICD-10 codes)  Updated  2019-09-21 with Dr. Tama High  INDICATION U/S 2 X week NST/AFI  or full BPP wkly DELIVERY Diabetes   A1 - good control - O24.410    A2 - good control - O24.419      A2  - poor control or poor compliance - O24.419, E11.65   (Macrosomia or polyhydramnios) **E11.65 is extra code for poor control**    A2/B - O24.  Marland Kitchen Headache   . Hypertension   . Infection    UTI  . Irritable bowel syndrome (IBS)   . Joint pain   . Morbid obesity with body mass index (BMI) of 45.0 to 49.9 in adult [facility])    . Obesity during pregnancy, antepartum 04/06/2019   Body mass index is 53.71 kg/m.  Recommendations [x]  Aspirin 81 mg daily after 12 weeks; discontinue after 36 weeks [ ]  Nutrition consult [ ]  Weight gain 11-20 lbs for singleton and 25-35 lbs for twin pregnancy (IOM guidelines) Higher class of obesity patients recommended to gain closer to lower limit  Weight loss is associated with adverse outcomes [ ]  Baseline and surveillance labs (pulled in fr  . Plantar fasciitis, bilateral    resolved  . Polycystic ovary syndrome   . Pre-diabetes    no meds, diet controlled, diet not check blood sugar  . Prediabetes    "prediabetes"  . Pregnancy induced hypertension   . Shortness of breath    albuterol inhaler  . Sleep apnea    no CPAP use  . Supervision of normal first pregnancy 04/06/2019   [name]: [ x]Initial [ x]12 [ ] 20 [ ] 28 [ ] 32 [ ] 36 [ ] 38 [ ] 39 [ ] 40 Nursing Staff Provider Office Location [facility]  Dating   LMP Language  English  Anatomy US  Nml Flu Vaccine  Declined 07/28/19 Genetic  Screen  NIPS: low risks   AFP:   negative  TDaP vaccine   08/25/19 Hgb A1C or  GTT Early A1C 5.8 Third trimester: GDM insulin Rhogam  NA   LAB RESULTS  Feeding Plan Breast Blood Type  . Vertigo 2017    Past Surgical History:  Procedure Laterality Date  . CESAREAN SECTION N/A 10/09/2019   Procedure: CESAREAN SECTION;  Surgeon: Aletha Halim, MD;  Location: [facility];  Service: Obstetrics;  Laterality: N/A;  . COLONOSCOPY WITH PROPOFOL  10/07/2016  . DILATION AND CURETTAGE OF UTERUS    . ESOPHAGOGASTRODUODENOSCOPY  12/21/2015  . EXCISION MASS UPPER EXTREMETIES Left 04/21/2017   Procedure: EXCISION MASS LEFT MIDDLE FINGER;  Surgeon: Daryll Brod, MD;  Location: [place];  Service: Orthopedics;  Laterality: Left;  . IR FL GUIDED LOC OF NEEDLE/CATH TIP FOR SPINAL INJECTION RT  03/13/2020  . PHOTOCOAGULATION WITH LASER Left 12/13/2020   Procedure: RETINOPLEXY LEFT EYE;  Surgeon:  Sherlynn Stalls, MD;  Location: [place];  Service: Ophthalmology;  Laterality: Left;  . SCLERAL BUCKLE Right 12/13/2020   Procedure: SCLERAL BUCKLE WITH CRYO;  Surgeon: Sherlynn Stalls, MD;  Location: [place];  Service: Ophthalmology;  Laterality: Right;    There were no vitals filed for this visit.   Subjective Assessment - 03/27/21 1750    Subjective Patient had f/u with Dr. Georgina Snell and received a cortisone injection in the Rt ankle and had an allergic reaction reporting itchiness and face swelling and was recommended by Dr. Georgina Snell to take anti-histamine, which did help. She just got off work, so she is in more pain all over.    Pertinent History acid reflux, alpha thalassemia trait, anemia, back pain, depression, gestational diabetes, HA, HTN, UTI, IBS, obesity, bilat plantar fasciitis, OSA, vertigo    Limitations Standing;Walking;House hold activities;Lifting    How long can you sit comfortably? sitting is fine    How long can you stand comfortably? maybe about 5 minutes    How long can you walk comfortably? about 5 minutes    Patient Stated Goals not eliminate the pain, but get the pain to decrease and be able to do my daily living, walking/standing and pick my daughter up when standing and play with the kids to not get tired very quickly.    Currently in Pain? Yes    Pain Score 8     Pain Location Generalized    Pain Descriptors / Indicators --   Pulling all over her body and ripping sensation in bilateral ankles   Pain Type Chronic pain    Pain Onset More than a month ago    Pain Frequency Constant    Pain Onset More than a month ago    Pain Onset --                             [facility] Adult PT Treatment/Exercise - 03/27/21 0001      Self-Care   Self-Care Other Self-Care Comments    Other Self-Care Comments  see patient education      Lumbar Exercises: Stretches   Lower Trunk Rotation 60 seconds      Lumbar Exercises: Supine   Bridge 10 reps    Bridge Limitations x2     Straight Leg Raise 10 reps    Straight Leg Raises Limitations x2      Knee/Hip Exercises: Sidelying   Hip ABduction 10 reps    Hip ABduction  Limitations x2      Ankle Exercises: Seated   Other Seated Ankle Exercises rockerboard A/P and lateral 2 x 10 each      Ankle Exercises: Stretches   Gastroc Stretch 60 seconds                  PT Education - 03/27/21 1811    Education Details Updated HEP.    Person(s) Educated Patient    Methods Explanation;Demonstration;Verbal cues;Tactile cues;Handout    Comprehension Verbalized understanding;Returned demonstration;Verbal cues required            PT Short Term Goals - 03/12/21 1837      PT SHORT TERM GOAL #1   Title Patient will be independent with initial HEP.    Baseline No time at eval to issue    Time 3    Period Weeks    Status New    Target Date 04/02/21      PT SHORT TERM GOAL #2   Title Patient will demonstrate at least 10 degrees of Rt ankle dorsiflexion AROM to improve gait mechanics.    Baseline 4    Time 4    Period Weeks    Status New    Target Date 04/09/21      PT SHORT TERM GOAL #3   Title Patient will demonstrate 5/5 Rt ankle inversion strength to improve stability with walking.    Baseline 4/5 with pain    Time 4    Period Weeks    Status New    Target Date 04/09/21      PT SHORT TERM GOAL #4   Title Patient will complete overhead lifting of light weight (<5 lbs) with proper muscle sequencing in order to avoid overuse of upper traps with UE activity.    Baseline significant upper trap compensation with UE MMT.    Time 4    Period Weeks    Status New    Target Date 04/09/21      PT SHORT TERM GOAL #5   Title Patient will squat 20 lbs without increased pain to improve ability to pick up her daughter in standing.    Baseline unable to pick up daughter in standing.    Time 4    Period Weeks    Status New    Target Date 04/09/21             PT Long Term Goals - 03/12/21 1848       PT LONG TERM GOAL #1   Title Patient will be independent with advanced HEP to manage her chronic conditions.    Baseline no HEP.    Time 8    Period Weeks    Status New    Target Date 05/07/21      PT LONG TERM GOAL #2   Title Patient will complete 5 x STS in </=13 seconds for improve functional strength and to signify decreased fall risk.    Baseline 17.3 seconds    Time 8    Period Weeks    Status New    Target Date 05/07/21      PT LONG TERM GOAL #3   Title Patient will maintain Rt SLS for at least 20 seconds to improve stability when walking on uneven surfaces.    Baseline 15 seconds    Time 8    Period Weeks    Status New    Target Date 05/07/21      PT LONG TERM GOAL #4  Title Patient will report ability to tolerate walking/standing for at least 1 hour with pain less than 2/10 to improve tolerance to work activity.    Baseline 5 minutes, current pain 5-6/10    Time 8    Period Weeks    Status New    Target Date 05/07/21                 Plan - 03/27/21 1756    Clinical Impression Statement Patient arrives with generalized body pain, most significant in bilateral ankles attributing it to just getting off of work. She recently received a cortisone injection on the Rt ankle, but had an allergic reaction and didn't feel as though it helped with her pain. Session focused stretching for her ankles and low back and hip strengthening with patient tolerating session well without increased pain. Intermittent cramping in LE muscles (adductors, abductors, and hamstring) with OK[facility] LE strengthening, otherwise no complaints throughout session. She quickly fatigues with hip strengthening reporting that her muscles feel like "ground beef" towards end of reps.    Personal Factors and Comorbidities Age;Comorbidity 3+;Fitness;Profession;Time since onset of injury/illness/exacerbation    Comorbidities alpha thalassemia trait, anemia, back pain, depression, gestational diabetes,  obesity, bilat plantar fasciitis, OSA, vertigo    Examination-Activity Limitations Stand;Locomotion Level;Lift    Examination-Participation Restrictions Occupation   playing with child   Stability/Clinical Decision Making Evolving/Moderate complexity    Rehab Potential Fair    PT Frequency 2x / week    PT Duration 8 weeks    PT Treatment/Interventions Moist Heat;Gait training;Stair training;Functional mobility training;Therapeutic activities;Therapeutic exercise;Balance training;Manual techniques;Patient/family education;Passive range of motion;Dry needling;Taping;ADLs/Self Care Home Management;Aquatic Therapy;Cryotherapy;Neuromuscular re-education    PT Next Visit Plan review HEP, gait training, balance, general UE/LE strengthening    PT Home Exercise Plan Access Code: [email or web address]    Consulted and Agree with Plan of Care Patient           Patient will benefit from skilled therapeutic intervention in order to improve the following deficits and impairments:  Difficulty walking,Abnormal gait,Decreased range of motion,Obesity,Pain,Postural dysfunction  Visit Diagnosis: Chronic bilateral low back pain, unspecified whether sciatica present  Cervicalgia  Chronic pain of both ankles  Difficulty in walking, not elsewhere classified     Problem List Patient Active Problem List   Diagnosis Date Noted  . DUB (dysfunctional uterine bleeding) 03/26/2021  . Levator spasm 11/21/2020  . Galactorrhea 11/19/2020  . Hx of migraine headaches 04/09/2020  . Contraceptive management 11/22/2019  . Positive RPR test 10/09/2019  . HTN (hypertension) 09/23/2019  . Migraine headache 07/12/2019  . Alpha thalassemia trait   . Vitamin D deficiency 09/16/2017  . Prediabetes 09/16/2017  . Depression 09/16/2017  . Class 3 severe obesity with serious comorbidity and body mass index (BMI) of 50.0 to 59.9 in adult ([place]) 09/16/2017  . PCOS (polycystic ovarian syndrome) 08/13/2017  . Mass 05/06/2017  . Vertigo  10/06/2016  . Microcytic anemia 09/26/2016  . Pseudotumor cerebri syndrome 08/28/2016  . Obesity hypoventilation syndrome ([place]) 08/28/2016  . Benign paroxysmal positional vertigo due to bilateral vestibular disorder 08/28/2016  . OSA (obstructive sleep apnea) 08/28/2016  . Hypersomnia with sleep apnea 08/28/2016  . Female hirsutism 08/28/2016  . GERD (gastroesophageal reflux disease) 02/24/2015  . Constipation 10/02/2014   Gwendolyn Grant, PT, DPT, ATC 03/27/21 6:39 PM  [facility] [address] [place], [place], [address] Phone: [phone]   Fax:  [phone]  Name: Maureen Lewis MRN: [id number] Date of Birth:  08/22/1996   

## 2021-03-29 ENCOUNTER — Ambulatory Visit (HOSPITAL_COMMUNITY): Payer: Medicaid Other

## 2021-03-29 ENCOUNTER — Ambulatory Visit (HOSPITAL_COMMUNITY)
Admission: EM | Admit: 2021-03-29 | Discharge: 2021-03-29 | Disposition: A | Discharge status: Home / Self Care | Payer: Medicaid Other | Attending: Urgent Care | Admitting: Urgent Care

## 2021-03-29 ENCOUNTER — Encounter (HOSPITAL_COMMUNITY): Payer: Self-pay | Admitting: Emergency Medicine

## 2021-03-29 ENCOUNTER — Ambulatory Visit (INDEPENDENT_AMBULATORY_CARE_PROVIDER_SITE_OTHER): Payer: Medicaid Other

## 2021-03-29 ENCOUNTER — Telehealth: Payer: Self-pay | Admitting: Family Medicine

## 2021-03-29 ENCOUNTER — Other Ambulatory Visit: Payer: Self-pay

## 2021-03-29 DIAGNOSIS — M25571 Pain in right ankle and joints of right foot: Secondary | ICD-10-CM | POA: Diagnosis not present

## 2021-03-29 DIAGNOSIS — S93401A Sprain of unspecified ligament of right ankle, initial encounter: Secondary | ICD-10-CM

## 2021-03-29 MED ORDER — NAPROXEN 500 MG PO TABS: 500.0000 mg | ORAL_TABLET | Freq: Two times a day (BID) | ORAL | 0 refills | Status: DC

## 2021-03-29 MED ORDER — KETOROLAC TROMETHAMINE 60 MG/2ML IM SOLN: 60.0000 mg | Freq: Once | INTRAMUSCULAR | Status: DC

## 2021-03-29 NOTE — ED Provider Notes (Signed)
[place]   MRN: [id number] DOB: Feb 21, 1996  Subjective:   Maureen Lewis is a 25 y.o. female presenting for suffering an acute right ankle injury this morning. Patient randomly fell, her foot, ankle buckled and she fell. Has since had pain, swelling. Had a fracture in 06/2020. Was managed without surgery. Has oxycodone from her previous fracture.   No current facility-administered medications for this encounter.  Current Outpatient Medications:    acetaZOLAMIDE (DIAMOX) 500 MG capsule, Take 1 capsule (500 mg total) by mouth 2 (two) times daily. For headache, Disp: 60 capsule, Rfl: 2   albuterol (VENTOLIN HFA) 108 (90 Base) MCG/ACT inhaler, Inhale 2 puffs into the lungs every 6 (six) hours as needed for wheezing or shortness of breath., Disp: 8 g, Rfl: 0   amLODipine (NORVASC) 10 MG tablet, Take 1 tablet (10 mg total) by mouth daily., Disp: 90 tablet, Rfl: 3   diclofenac (VOLTAREN) 75 MG EC tablet, Take 1 tablet (75 mg total) by mouth 2 (two) times daily., Disp: 60 tablet, Rfl: 1   ferrous sulfate 325 (65 FE) MG tablet, Take 1 tablet (325 mg total) by mouth every other day. (Patient taking differently: Take 325 mg by mouth every Monday, Wednesday, and Friday.), Disp: 45 tablet, Rfl: 1   hydrochlorothiazide (HYDRODIURIL) 25 MG tablet, Take 1 tablet (25 mg total) by mouth daily., Disp: 90 tablet, Rfl: 3   ibuprofen (ADVIL) 800 MG tablet, Take 800 mg by mouth every 8 (eight) hours as needed., Disp: , Rfl:    liraglutide (VICTOZA) 18 MG/3ML SOPN, Inject 1.2 mg into the skin daily. 1.2, Disp: 6 mL, Rfl: 0   loratadine (CLARITIN) 10 MG tablet, Take 10 mg by mouth daily as needed for allergies. , Disp: , Rfl:    polyethylene glycol powder (GLYCOLAX/MIRALAX) 17 GM/SCOOP powder, Take 17 g by mouth daily as needed for moderate constipation. , Disp: , Rfl:    tiZANidine (ZANAFLEX) 4 MG tablet, Take 1 tablet (4 mg total) by mouth every 6 (six) hours as needed for muscle  spasms., Disp: 30 tablet, Rfl: 2   Vitamin D, Ergocalciferol, (DRISDOL) 1.25 MG (50000 UNIT) CAPS capsule, Take 1 capsule (50,000 Units total) by mouth every 7 (seven) days., Disp: 4 capsule, Rfl: 0   Allergies  Allergen Reactions   Bee Pollen Hives, Shortness Of Breath and Swelling   Bee Venom Hives, Shortness Of Breath and Swelling   Hydrocodone-Acetaminophen Anaphylaxis    No problem when she takes Tylenol   Peach Flavor Hives, Shortness Of Breath and Other (See Comments)    SWELLING OF MOUTH   Pollen Extract Hives, Shortness Of Breath and Swelling   Remdesivir Swelling    Angioedema 8/25   Cortisone Itching and Swelling   Cortizone-10 [Hydrocortisone]     Swelling and itching    Past Medical History:  Diagnosis Date   Abnormal uterine bleeding    Acid reflux    Alpha thalassemia trait    Amenorrhea    Anemia    no current med.   Back pain    Cesarean delivery delivered 10/11/2019   10/09/2019 - primary CS for failed IOL   Chest pain    resolved   Complication of anesthesia    states had to keep giving her anesthesia during EGD   Constipation    Depression    "I'm good"   Fibromyalgia    Finger mass, left 03/2017   middle finger   Gestational diabetes 05/23/2019  hx - with pregnancy only    Gestational hypertension 09/23/2019   Guidelines for Antenatal Testing and Sonography  (with updated ICD-10 codes)  Updated  October 06, 2019 with Dr. Tama High  INDICATION U/S 2 X week NST/AFI  or full BPP wkly DELIVERY Diabetes   A1 - good control - O24.410    A2 - good control - O24.419      A2  - poor control or poor compliance - O24.419, E11.65   (Macrosomia or polyhydramnios) **E11.65 is extra code for poor control**    A2/B - O24.   Headache    Hypertension    Infection    UTI   Irritable bowel syndrome (IBS)    Joint pain    Morbid obesity with body mass index (BMI) of 45.0 to 49.9 in adult [facility])    Obesity during pregnancy, antepartum 04/06/2019   Body mass index is 53.71  kg/m.  Recommendations [x]  Aspirin 81 mg daily after 12 weeks; discontinue after 36 weeks [ ]  Nutrition consult [ ]  Weight gain 11-20 lbs for singleton and 25-35 lbs for twin pregnancy (IOM guidelines) Higher class of obesity patients recommended to gain closer to lower limit  Weight loss is associated with adverse outcomes [ ]  Baseline and surveillance labs (pulled in fr   Plantar fasciitis, bilateral    resolved   Polycystic ovary syndrome    Pre-diabetes    no meds, diet controlled, diet not check blood sugar   Prediabetes    "prediabetes"   Pregnancy induced hypertension    Shortness of breath    albuterol inhaler   Sleep apnea    no CPAP use   Supervision of normal first pregnancy 04/06/2019   [name]: [ x]Initial [ x]12 [ ] 20 [ ] 28 [ ] 32 [ ] 36 [ ] 38 [ ] 39 [ ] 40 Nursing Staff Provider Office Location [facility]  Dating   LMP Language  English  Anatomy US  Nml Flu Vaccine  Declined 07/28/19 Genetic Screen  NIPS: low risks   AFP:   negative  TDaP vaccine   08/25/19 Hgb A1C or  GTT Early A1C 5.8 Third trimester: GDM insulin Rhogam  NA   LAB RESULTS  Feeding Plan Breast Blood Type   Vertigo 2017     Past Surgical History:  Procedure Laterality Date   CESAREAN SECTION N/A 10/09/2019   Procedure: CESAREAN SECTION;  Surgeon: Aletha Halim, MD;  Location: [facility];  Service: Obstetrics;  Laterality: N/A;   COLONOSCOPY WITH PROPOFOL  10/07/2016   DILATION AND CURETTAGE OF UTERUS     ESOPHAGOGASTRODUODENOSCOPY  12/21/2015   EXCISION MASS UPPER EXTREMETIES Left 04/21/2017   Procedure: EXCISION MASS LEFT MIDDLE FINGER;  Surgeon: Daryll Brod, MD;  Location: [place];  Service: Orthopedics;  Laterality: Left;   IR FL GUIDED LOC OF NEEDLE/CATH TIP FOR SPINAL INJECTION RT  03/13/2020   PHOTOCOAGULATION WITH LASER Left 12/13/2020   Procedure: RETINOPLEXY LEFT EYE;  Surgeon: Sherlynn Stalls, MD;  Location: [place];  Service: Ophthalmology;  Laterality: Left;   SCLERAL BUCKLE  Right 12/13/2020   Procedure: SCLERAL BUCKLE WITH CRYO;  Surgeon: Sherlynn Stalls, MD;  Location: [place];  Service: Ophthalmology;  Laterality: Right;    Family History  Problem Relation Age of Onset   Diabetes Maternal Aunt    Hypertension Maternal Uncle    Depression Maternal Grandfather    Diabetes Maternal Grandfather    Hypertension Maternal Grandfather    Arthritis Mother    High blood pressure Mother  Depression Mother    Sleep apnea Mother    Obesity Mother    Diabetes Mother    Hypertension Mother    Kidney failure Mother    Sleep apnea Father    Obesity Father    Healthy Daughter     Social History   Tobacco Use   Smoking status: Never   Smokeless tobacco: Never  Vaping Use   Vaping Use: Never used  Substance Use Topics   Alcohol use: Not Currently    Comment: social    Drug use: No    ROS   Objective:   Vitals: BP 130/66   Pulse 79   Temp 98 F (36.7 C) (Oral)   Resp 19   LMP 03/22/2021   SpO2 97%   Physical Exam Constitutional:      General: She is not in acute distress.    Appearance: Normal appearance. She is well-developed. She is obese. She is not ill-appearing.  HENT:     Head: Normocephalic and atraumatic.     Nose: Nose normal.     Mouth/Throat:     Mouth: Mucous membranes are moist.     Pharynx: Oropharynx is clear.  Eyes:     General: No scleral icterus.    Extraocular Movements: Extraocular movements intact.     Pupils: Pupils are equal, round, and reactive to light.  Cardiovascular:     Rate and Rhythm: Normal rate.  Pulmonary:     Effort: Pulmonary effort is normal.  Musculoskeletal:     Right ankle: Swelling present. No deformity, ecchymosis or lacerations. Tenderness present over the lateral malleolus, ATF ligament and AITF ligament. No medial malleolus, CF ligament, posterior TF ligament, base of 5th metatarsal or proximal fibula tenderness. Decreased range of motion.     Right Achilles Tendon: No tenderness or defects.  Thompson's test negative.  Skin:    General: Skin is warm and dry.  Neurological:     General: No focal deficit present.     Mental Status: She is alert and oriented to person, place, and time.  Psychiatric:        Mood and Affect: Mood normal.        Behavior: Behavior normal.    DG Ankle Complete Right  Result Date: 03/29/2021 CLINICAL DATA:  Right ankle pain. EXAM: RIGHT ANKLE - COMPLETE 3+ VIEW COMPARISON:  None FINDINGS: The ankle mortise is maintained. No acute ankle fracture or osteochondral abnormality. No definite ankle joint effusion. The visualized mid and hindfoot bony structures are intact. IMPRESSION: No acute bony findings. Electronically Signed   By: Marijo Sanes M.D.   On: 03/29/2021 18:46     Right ankle wrapped using 6" Ace wrap in figure-8 method.   Assessment and Plan :   PDMP not reviewed this encounter.  1. Sprain of right ankle, unspecified ligament, initial encounter   2. Acute right ankle pain     Patient refused crutches. Will manage for ankle sprain with rice method, NSAID. Counseled patient on potential for adverse effects with medications prescribed/recommended today, ER and return-to-clinic precautions discussed, patient verbalized understanding.    Jaynee Eagles, [place] 03/29/21 1928

## 2021-03-29 NOTE — Telephone Encounter (Signed)
Patient called stating that when she got up this morning she was walking to the restroom and her right ankle "buckled". She said that it is very painful and hard to walk on. She was concerned that she might have hurt it again (this is the same ankle that she broke in the past).  Please advise.

## 2021-03-29 NOTE — ED Triage Notes (Signed)
Pt is present today with right ankle pain. Pt states that she was walking to the bathroom this morning and her ankle buckled and she felt a sharp pain shoot through her ankle. Pt states that she is unable to bare weight on her ankle without feeling pain.Pt states that she did noticed some swelling this morning.

## 2021-03-29 NOTE — Telephone Encounter (Signed)
Continue physical therapy.  Use boot as needed.  I have ordered an x-ray to be done to make sure that is not broken.  Please call my office to schedule the x-ray to be done at your convenience today or early next week.  Keep the appointment that is scheduled for June 21.  We can move that appointment sooner if x-ray shows something worse than what we have previously seen.

## 2021-03-29 NOTE — Telephone Encounter (Signed)
Spoke to patient and relayed information

## 2021-03-29 NOTE — Telephone Encounter (Signed)
Called patient Gave Dr Corey's response. She said that she no longer has the boot (are we able to give her a new one?). She will come in to get the xray but also mentioned that last time the fracture was seen on an MRI. Can we order an MRI as well?  Please advise.

## 2021-03-30 ENCOUNTER — Ambulatory Visit: Payer: Medicaid Other | Admitting: Physical Therapy

## 2021-04-01 ENCOUNTER — Encounter: Payer: Self-pay | Admitting: Obstetrics and Gynecology

## 2021-04-01 ENCOUNTER — Ambulatory Visit (INDEPENDENT_AMBULATORY_CARE_PROVIDER_SITE_OTHER): Payer: Medicaid Other | Admitting: Obstetrics and Gynecology

## 2021-04-01 ENCOUNTER — Other Ambulatory Visit: Payer: Self-pay

## 2021-04-01 DIAGNOSIS — Z30430 Encounter for insertion of intrauterine contraceptive device: Secondary | ICD-10-CM | POA: Insufficient documentation

## 2021-04-01 DIAGNOSIS — Z01812 Encounter for preprocedural laboratory examination: Secondary | ICD-10-CM | POA: Diagnosis not present

## 2021-04-01 LAB — POCT URINE PREGNANCY: Preg Test, Ur: NEGATIVE

## 2021-04-01 MED ORDER — LEVONORGESTREL 20 MCG/DAY IU IUD
1.0000 | INTRAUTERINE_SYSTEM | Freq: Once | INTRAUTERINE | Status: AC
  Administered 2021-04-01: 1 via INTRAUTERINE

## 2021-04-01 NOTE — Progress Notes (Signed)
No fractures are present right ankle.

## 2021-04-01 NOTE — Patient Instructions (Signed)
IUD PLACEMENT POST-PROCEDURE INSTRUCTIONS ° °You may take Ibuprofen, Aleve or Tylenol for pain if needed.  Cramping should resolve within in 24 hours. ° °You may have a small amount of spotting.  You should wear a mini pad for the next few days. ° °You may have intercourse after 24 hours.  If you using this for birth control, it is effective immediately. ° °You need to call if you have any pelvic pain, fever, heavy bleeding or foul smelling vaginal discharge.  Irregular bleeding is common the first several months after having an IUD placed. You do not need to call for this reason unless you are concerned. ° °Shower or bathe as normal ° °You should have a follow-up appointment in 4-8 weeks for a re-check to make sure you are not having any problems. °

## 2021-04-01 NOTE — Progress Notes (Addendum)
GYN presents for IUD insertion MIRENA.  UPT today NEGATIVE   Administrations This Visit     levonorgestrel (MIRENA) 20 MCG/DAY IUD 1 each     Admin Date 04/01/2021 Action Given Dose 1 each Route Intrauterine Administered By Tamela Oddi, RMA

## 2021-04-01 NOTE — Progress Notes (Signed)
Pt viewed Dr. Corey's result note via MyChart.  

## 2021-04-01 NOTE — Progress Notes (Signed)
    GYNECOLOGY CLINIC PROCEDURE NOTE  Maureen Lewis is a 25 y.o. G1P1001 here for Mirena IUD insertion. No GYN concerns.   IUD Insertion Procedure Note Patient identified, informed consent performed, consent signed.   Discussed risks of irregular bleeding, cramping, infection, malpositioning or misplacement of the IUD outside the uterus which may require further procedure such as laparoscopy. Time out was performed.  Urine pregnancy test negative.  Speculum placed in the vagina.  Cervix visualized.  Cleaned with Betadine x 2.  Grasped anteriorly with a single tooth tenaculum.  Uterus sounded to 8 cm.  Mirena IUD placed per manufacturer's recommendations.  Strings trimmed to 3 cm. Tenaculum was removed, good hemostasis noted.  Patient tolerated procedure well.   Patient was given post-procedure instructions.  She was advised to have backup contraception for one week.  Patient was also asked to check IUD strings periodically and follow up in 4 weeks for IUD check.    Arlina Robes MD, [place] Attending Brush for [facility], [place]

## 2021-04-06 ENCOUNTER — Other Ambulatory Visit: Payer: Self-pay

## 2021-04-06 ENCOUNTER — Ambulatory Visit: Payer: Medicaid Other

## 2021-04-06 DIAGNOSIS — M25572 Pain in left ankle and joints of left foot: Secondary | ICD-10-CM | POA: Diagnosis not present

## 2021-04-06 DIAGNOSIS — M25571 Pain in right ankle and joints of right foot: Secondary | ICD-10-CM

## 2021-04-06 DIAGNOSIS — G8929 Other chronic pain: Secondary | ICD-10-CM

## 2021-04-06 DIAGNOSIS — R293 Abnormal posture: Secondary | ICD-10-CM | POA: Diagnosis not present

## 2021-04-06 DIAGNOSIS — M25671 Stiffness of right ankle, not elsewhere classified: Secondary | ICD-10-CM

## 2021-04-06 DIAGNOSIS — R262 Difficulty in walking, not elsewhere classified: Secondary | ICD-10-CM

## 2021-04-06 DIAGNOSIS — S82891A Other fracture of right lower leg, initial encounter for closed fracture: Secondary | ICD-10-CM

## 2021-04-06 DIAGNOSIS — M542 Cervicalgia: Secondary | ICD-10-CM

## 2021-04-06 DIAGNOSIS — M545 Low back pain, unspecified: Secondary | ICD-10-CM | POA: Diagnosis not present

## 2021-04-06 NOTE — Therapy (Signed)
[place], [place], [address] Phone: [phone]   Fax:  [phone]  Physical Therapy Treatment  Patient Details  Name: Maureen Lewis MRN: [id number] Date of Birth: 05-Apr-1996 Referring Provider (PT): Gregor Hams,   Encounter Date: 04/06/2021   PT End of Session - 04/06/21 1110     Visit Number 3    Number of Visits 17    Date for PT Re-Evaluation 05/11/21    Authorization Type Medicaid [employer]    Authorization Time Period requesting auth    PT Start Time 1115    PT Stop Time 1200    PT Time Calculation (min) 45 min    Activity Tolerance Patient tolerated treatment well    Behavior During Therapy [facility] for tasks assessed/performed             Past Medical History:  Diagnosis Date   Abnormal uterine bleeding    Acid reflux    Alpha thalassemia trait    Amenorrhea    Anemia    no current med.   Back pain    Cesarean delivery delivered 10/11/2019   10/09/2019 - primary CS for failed IOL   Chest pain    resolved   Complication of anesthesia    states had to keep giving her anesthesia during EGD   Constipation    Depression    "I'm good"   Fibromyalgia    Finger mass, left 03/2017   middle finger   Gestational diabetes 05/23/2019   hx - with pregnancy only    Gestational hypertension 09/23/2019   Guidelines for Antenatal Testing and Sonography  (with updated ICD-10 codes)  Updated  Sep 21, 2019 with Dr. Tama High  INDICATION U/S 2 X week NST/AFI  or full BPP wkly DELIVERY Diabetes   A1 - good control - O24.410    A2 - good control - O24.419      A2  - poor control or poor compliance - O24.419, E11.65   (Macrosomia or polyhydramnios) **E11.65 is extra code for poor control**    A2/B - O24.   Headache    Hypertension    Infection    UTI   Irritable bowel syndrome (IBS)    Joint pain    Morbid obesity with body mass index (BMI) of 45.0 to 49.9 in adult [facility])    Obesity  during pregnancy, antepartum 04/06/2019   Body mass index is 53.71 kg/m.  Recommendations _0  Aspirin 81 mg daily after 12 weeks; discontinue after 36 weeks _1  Nutrition consult _2  Weight gain 11-20 lbs for singleton and 25-35 lbs for twin pregnancy (IOM guidelines) Higher class of obesity patients recommended to gain closer to lower limit  Weight loss is associated with adverse outcomes _3  Baseline and surveillance labs (pulled in fr   Plantar fasciitis, bilateral    resolved   Polycystic ovary syndrome    Pre-diabetes    no meds, diet controlled, diet not check blood sugar   Prediabetes    "prediabetes"   Pregnancy induced hypertension    Shortness of breath    albuterol inhaler   Sleep apnea    no CPAP use   Supervision of normal first pregnancy 04/06/2019   [name]: [ x]Initial [ x]12 _4 20 _5 28 _6 32 _7 36 _8 38 _9 39 _10 40 Nursing Staff Provider Office Location [facility]  Dating   LMP Language  English  Anatomy US  Nml Flu Vaccine  Declined 07/28/19 Genetic Screen  NIPS: low risks   AFP:   negative  TDaP vaccine   08/25/19 Hgb A1C or  GTT Early A1C 5.8 Third trimester: GDM insulin Rhogam  NA   LAB RESULTS  Feeding Plan Breast Blood Type   Vertigo 2017    Past Surgical History:  Procedure Laterality Date   CESAREAN SECTION N/A 10/09/2019   Procedure: CESAREAN SECTION;  Surgeon: Aletha Halim, MD;  [facility];  Service: Obstetrics;  Laterality: N/A;   COLONOSCOPY WITH PROPOFOL  10/07/2016   DILATION AND CURETTAGE OF UTERUS     ESOPHAGOGASTRODUODENOSCOPY  12/21/2015   EXCISION MASS UPPER EXTREMETIES Left 04/21/2017   Procedure: EXCISION MASS LEFT MIDDLE FINGER;  Surgeon: Daryll Brod, MD;  Location: [place];  Service: Orthopedics;  Laterality: Left;   IR FL GUIDED LOC OF NEEDLE/CATH TIP FOR SPINAL INJECTION RT  03/13/2020   PHOTOCOAGULATION WITH LASER Left 12/13/2020   Procedure: RETINOPLEXY LEFT EYE;  Surgeon: Sherlynn Stalls, MD;  Location: [place];  Service: Ophthalmology;  Laterality: Left;   SCLERAL BUCKLE Right 12/13/2020   Procedure: SCLERAL BUCKLE WITH CRYO;  Surgeon: Sherlynn Stalls, MD;  Location: [place];  Service: Ophthalmology;  Laterality: Right;    There were no vitals filed for this visit.   Subjective Assessment - 04/06/21 1117     Subjective Pt reports she has been off work for 2 days and both her L ankle and low back are feeling better. When she is on her feet alot at work, she uses an ice pack and a heating pad for her back to manage the pain.    Currently in Pain? Yes    Pain Score 4     Pain Location Generalized    Pain Orientation Right;Left    Pain Descriptors / Indicators --   Pulling all over her body and ripping sensation in bilateral ankles   Pain Type Chronic pain    Pain Score 4    Pain Location Back    Pain Orientation Posterior;Lower    Pain Descriptors / Indicators --   pulling/twisting   Pain Type Chronic pain    Pain Onset More than a month ago    Pain Frequency Constant    Pain Score 4    Pain Location Ankle    Pain Orientation Right    Pain Descriptors / Indicators Aching    Pain Type Chronic pain                [facility] PT Assessment - 04/06/21 0001       AROM   Right Ankle Dorsiflexion 10      Strength   Right Ankle Dorsiflexion 5/5    Right Ankle Plantar Flexion 5/5    Right Ankle Inversion 5/5    Right Ankle Eversion 5/5                           [facility] Adult PT Treatment/Exercise - 04/06/21 0001       Lumbar Exercises: Stretches   Active Hamstring Stretch Right;Left;60 seconds      Lumbar Exercises: Supine   Bridge 10 reps    Bridge Limitations x2    Straight Leg Raise 10 reps    Straight Leg Raises Limitations x2      Knee/Hip Exercises: Sidelying   Hip ABduction 10 reps    Hip ABduction Limitations x2      Ankle Exercises: Seated  Other Seated Ankle Exercises rockerboard A/P and lateral 20x each    Other Seated Ankle Exercises 4 way ankle c  red Tband; 20x each ankle and motion      Ankle Exercises: Stretches   Gastroc Stretch 2 reps;30 seconds   strap   Other Stretch --                 Balance Exercises - 04/06/21 0001       Balance Exercises: Standing   Standing Eyes Opened Narrow base of support (BOS);Foam/compliant surface;20 secs;2 reps    Tandem Stance Eyes open;Foam/compliant surface;2 reps;30 secs   partial tandem   SLS Eyes open;Solid surface;2 reps;30 secs                 PT Short Term Goals - 04/06/21 1207       PT SHORT TERM GOAL #1   Title Patient will be independent with initial HEP.    Status Achieved    Target Date 04/06/21      PT SHORT TERM GOAL #2   Title Patient will demonstrate at least 10 degrees of Rt ankle dorsiflexion AROM to improve gait mechanics.    Status Achieved    Target Date 04/06/21      PT SHORT TERM GOAL #3   Title Patient will demonstrate 5/5 Rt ankle inversion strength to improve stability with walking.    Status Achieved    Target Date 04/06/21               PT Long Term Goals - 03/12/21 1848       PT LONG TERM GOAL #1   Title Patient will be independent with advanced HEP to manage her chronic conditions.    Baseline no HEP.    Time 8    Period Weeks    Status New    Target Date 05/07/21      PT LONG TERM GOAL #2   Title Patient will complete 5 x STS in </=13 seconds for improve functional strength and to signify decreased fall risk.    Baseline 17.3 seconds    Time 8    Period Weeks    Status New    Target Date 05/07/21      PT LONG TERM GOAL #3   Title Patient will maintain Rt SLS for at least 20 seconds to improve stability when walking on uneven surfaces.    Baseline 15 seconds    Time 8    Period Weeks    Status New    Target Date 05/07/21      PT LONG TERM GOAL #4   Title Patient will report ability to tolerate walking/standing for at least 1 hour with pain less than 2/10 to improve tolerance to work activity.    Baseline 5  minutes, current pain 5-6/10    Time 8    Period Weeks    Status New    Target Date 05/07/21                   Plan - 04/06/21 1156     Clinical Impression Statement Pt participated in PT to address mobility and strengthening of her ankles, low back and hips. Balance activities were completed for NBOS, partial tandem, and SLS. Pt demonstrated good balance for all activities maintaining balance without UE assist for 30 sec with each balance activity. With changing positions c STS, supine t/f sitting and rolling, pt completed these movement patterns in a smooth manner  with good quality. Theraband exs for ankle strengthening were added to the pt's HEP. Pt is making appropriate progress with STGs for R ankle DF ROM and Inv strength being met.    Personal Factors and Comorbidities Age;Comorbidity 3+;Fitness;Profession;Time since onset of injury/illness/exacerbation    Comorbidities alpha thalassemia trait, anemia, back pain, depression, gestational diabetes, obesity, bilat plantar fasciitis, OSA, vertigo    Examination-Activity Limitations Stand;Locomotion Level;Lift    Examination-Participation Restrictions Occupation    Stability/Clinical Decision Making Evolving/Moderate complexity    Clinical Decision Making High    Rehab Potential Fair    PT Frequency 2x / week    PT Duration 8 weeks    PT Treatment/Interventions Moist Heat;Gait training;Stair training;Functional mobility training;Therapeutic activities;Therapeutic exercise;Balance training;Manual techniques;Patient/family education;Passive range of motion;Dry needling;Taping;ADLs/Self Care Home Management;Aquatic Therapy;Cryotherapy;Neuromuscular re-education    PT Next Visit Plan review HEP, gait training, balance, general UE/LE strengthening    PT Home Exercise Plan Access Code: [email or web address]    Consulted and Agree with Plan of Care Patient             Patient will benefit from skilled therapeutic intervention in order to  improve the following deficits and impairments:  Difficulty walking, Abnormal gait, Decreased range of motion, Obesity, Pain, Postural dysfunction  Visit Diagnosis: Chronic bilateral low back pain, unspecified whether sciatica present  Cervicalgia  Chronic pain of both ankles  Difficulty in walking, not elsewhere classified  Abnormal posture  Closed fracture of right ankle, initial encounter  Decreased range of motion of right ankle     Problem List Patient Active Problem List   Diagnosis Date Noted   Encounter for insertion of mirena IUD 04/01/2021   Levator spasm 11/21/2020   Galactorrhea 11/19/2020   Hx of migraine headaches 04/09/2020   Contraceptive management 11/22/2019   Positive RPR test 10/09/2019   HTN (hypertension) 09/23/2019   Migraine headache 07/12/2019   Alpha thalassemia trait    Vitamin D deficiency 09/16/2017   Prediabetes 09/16/2017   Depression 09/16/2017   Class 3 severe obesity with serious comorbidity and body mass index (BMI) of 50.0 to 59.9 in adult ([place]) 09/16/2017   PCOS (polycystic ovarian syndrome) 08/13/2017   Mass 05/06/2017   Vertigo 10/06/2016   Microcytic anemia 09/26/2016   Pseudotumor cerebri syndrome 08/28/2016   Obesity hypoventilation syndrome ([place]) 08/28/2016   Benign paroxysmal positional vertigo due to bilateral vestibular disorder 08/28/2016   OSA (obstructive sleep apnea) 08/28/2016   Hypersomnia with sleep apnea 08/28/2016   Female hirsutism 08/28/2016   GERD (gastroesophageal reflux disease) 02/24/2015   Constipation 10/02/2014    Gar Ponto MS, PT 04/06/21 12:25 PM   [facility] [address] [place], [place], [address] Phone: [phone]   Fax:  [phone]  Name: Miley Blanchett Wanat MRN: [id number] Date of Birth: [birth year]

## 2021-04-08 ENCOUNTER — Other Ambulatory Visit (HOSPITAL_COMMUNITY)
Admission: RE | Admit: 2021-04-08 | Discharge: 2021-04-08 | Disposition: A | Discharge status: Home / Self Care | Payer: Medicaid Other | Source: Ambulatory Visit | Attending: Family Medicine | Admitting: Family Medicine

## 2021-04-08 ENCOUNTER — Ambulatory Visit: Payer: Medicaid Other | Admitting: Family Medicine

## 2021-04-08 ENCOUNTER — Other Ambulatory Visit: Payer: Self-pay

## 2021-04-08 VITALS — BP 130/80 | HR 102 | Temp 98.4°F | Wt >= 6400 oz

## 2021-04-08 DIAGNOSIS — Z113 Encounter for screening for infections with a predominantly sexual mode of transmission: Secondary | ICD-10-CM | POA: Diagnosis not present

## 2021-04-08 NOTE — Progress Notes (Signed)
Established Patient Office Visit  Subjective:  Patient ID: Maureen Lewis, female    DOB: March 22, 1996  Age: 25 y.o. MRN: [id number]  CC:  Chief Complaint  Patient presents with   Labs Only    Pt would like std testing, complains of urinary frequency and dysuria    HPI Maureen Lewis presents for request for STD testing.  She states that she has 1 partner who she has been with for some time.  They use condoms but recently condom broke.  This was over a week ago.  She actually had IUD placed a week ago with pregnancy test that was -1-week ago.  She denies any burning with urination.  Denies any vaginal discharge.  No rashes.  No fever or chills.  No prior history of STD.  She thinks she has had prior immunization for hepatitis B  Past Medical History:  Diagnosis Date   Abnormal uterine bleeding    Acid reflux    Alpha thalassemia trait    Amenorrhea    Anemia    no current med.   Back pain    Cesarean delivery delivered 10/11/2019   10/09/2019 - primary CS for failed IOL   Chest pain    resolved   Complication of anesthesia    states had to keep giving her anesthesia during EGD   Constipation    Depression    "I'm good"   Fibromyalgia    Finger mass, left 03/2017   middle finger   Gestational diabetes 05/23/2019   hx - with pregnancy only    Gestational hypertension 09/23/2019   Guidelines for Antenatal Testing and Sonography  (with updated ICD-10 codes)  Updated  26-Sep-2019 with Dr. Tama High  INDICATION U/S 2 X week NST/AFI  or full BPP wkly DELIVERY Diabetes   A1 - good control - O24.410    A2 - good control - O24.419      A2  - poor control or poor compliance - O24.419, E11.65   (Macrosomia or polyhydramnios) **E11.65 is extra code for poor control**    A2/B - O24.   Headache    Hypertension    Infection    UTI   Irritable bowel syndrome (IBS)    Joint pain    Morbid obesity with body mass index (BMI) of 45.0 to 49.9 in adult [facility])    Obesity  during pregnancy, antepartum 04/06/2019   Body mass index is 53.71 kg/m.  Recommendations [x]  Aspirin 81 mg daily after 12 weeks; discontinue after 36 weeks [ ]  Nutrition consult [ ]  Weight gain 11-20 lbs for singleton and 25-35 lbs for twin pregnancy (IOM guidelines) Higher class of obesity patients recommended to gain closer to lower limit  Weight loss is associated with adverse outcomes [ ]  Baseline and surveillance labs (pulled in fr   Plantar fasciitis, bilateral    resolved   Polycystic ovary syndrome    Pre-diabetes    no meds, diet controlled, diet not check blood sugar   Prediabetes    "prediabetes"   Pregnancy induced hypertension    Shortness of breath    albuterol inhaler   Sleep apnea    no CPAP use   Supervision of normal first pregnancy 04/06/2019   [name]: [ x]Initial [ x]12 [ ] 20 [ ] 28 [ ] 32 [ ] 36 [ ] 38 [ ] 39 [ ] 40 Nursing Staff Provider Office Location [facility]  Dating   LMP Language  English  Anatomy US  Nml Flu Vaccine  Declined 07/28/19 Genetic Screen  NIPS: low risks   AFP:   negative  TDaP vaccine   08/25/19 Hgb A1C or  GTT Early A1C 5.8 Third trimester: GDM insulin Rhogam  NA   LAB RESULTS  Feeding Plan Breast Blood Type   Vertigo 2017    Past Surgical History:  Procedure Laterality Date   CESAREAN SECTION N/A 10/09/2019   Procedure: CESAREAN SECTION;  Surgeon: Aletha Halim, MD;  [facility];  Service: Obstetrics;  Laterality: N/A;   COLONOSCOPY WITH PROPOFOL  10/07/2016   DILATION AND CURETTAGE OF UTERUS     ESOPHAGOGASTRODUODENOSCOPY  12/21/2015   EXCISION MASS UPPER EXTREMETIES Left 04/21/2017   Procedure: EXCISION MASS LEFT MIDDLE FINGER;  Surgeon: Daryll Brod, MD;  Location: [place];  Service: Orthopedics;  Laterality: Left;   IR FL GUIDED LOC OF NEEDLE/CATH TIP FOR SPINAL INJECTION RT  03/13/2020   PHOTOCOAGULATION WITH LASER Left 12/13/2020   Procedure: RETINOPLEXY LEFT EYE;  Surgeon: Sherlynn Stalls, MD;  Location: [place];  Service: Ophthalmology;  Laterality: Left;   SCLERAL BUCKLE Right 12/13/2020   Procedure: SCLERAL BUCKLE WITH CRYO;  Surgeon: Sherlynn Stalls, MD;  Location: [place];  Service: Ophthalmology;  Laterality: Right;    Family History  Problem Relation Age of Onset   Diabetes Maternal Aunt    Hypertension Maternal Uncle    Depression Maternal Grandfather    Diabetes Maternal Grandfather    Hypertension Maternal Grandfather    Arthritis Mother    High blood pressure Mother    Depression Mother    Sleep apnea Mother    Obesity Mother    Diabetes Mother    Hypertension Mother    Kidney failure Mother    Sleep apnea Father    Obesity Father    Healthy Daughter     Social History   Socioeconomic History   Marital status: Single    Spouse name: Not on file   Number of children: Not on file   Years of education: Not on file   Highest education level: Not on file  Occupational History   Occupation: Chemical engineer    Employer: [id number]  Tobacco Use   Smoking status: Never   Smokeless tobacco: Never  Vaping Use   Vaping Use: Never used  Substance and Sexual Activity   Alcohol use: Not Currently    Comment: social    Drug use: No   Sexual activity: Yes    Birth control/protection: Condom  Other Topics Concern   Not on file  Social History Narrative   Right handed    Soda sometimes   Lives with mom and cousin, a grandmother in [place] also helps care for her.       She is in nursing school.    Social Determinants of Health   Financial Resource Strain: Not on file  Food Insecurity: Not on file  Transportation Needs: Not on file  Physical Activity: Not on file  Stress: Not on file  Social Connections: Not on file  Intimate Partner Violence: Not on file    Outpatient Medications Prior to Visit  Medication Sig Dispense Refill   acetaZOLAMIDE (DIAMOX) 500 MG capsule Take 1 capsule (500 mg total) by mouth 2 (two) times daily. For headache 60 capsule 2   albuterol  (VENTOLIN HFA) 108 (90 Base) MCG/ACT inhaler Inhale 2 puffs into the lungs every 6 (six) hours as needed for wheezing or shortness of breath. 8 g 0   amLODipine (NORVASC)  10 MG tablet Take 1 tablet (10 mg total) by mouth daily. 90 tablet 3   diclofenac (VOLTAREN) 75 MG EC tablet Take 1 tablet (75 mg total) by mouth 2 (two) times daily. 60 tablet 1   hydrochlorothiazide (HYDRODIURIL) 25 MG tablet Take 1 tablet (25 mg total) by mouth daily. 90 tablet 3   ibuprofen (ADVIL) 800 MG tablet Take 800 mg by mouth every 8 (eight) hours as needed.     liraglutide (VICTOZA) 18 MG/3ML SOPN Inject 1.2 mg into the skin daily. 1.2 6 mL 0   loratadine (CLARITIN) 10 MG tablet Take 10 mg by mouth daily as needed for allergies.      naproxen (NAPROSYN) 500 MG tablet Take 1 tablet (500 mg total) by mouth 2 (two) times daily with a meal. 30 tablet 0   polyethylene glycol powder (GLYCOLAX/MIRALAX) 17 GM/SCOOP powder Take 17 g by mouth daily as needed for moderate constipation.      tiZANidine (ZANAFLEX) 4 MG tablet Take 1 tablet (4 mg total) by mouth every 6 (six) hours as needed for muscle spasms. 30 tablet 2   Vitamin D, Ergocalciferol, (DRISDOL) 1.25 MG (50000 UNIT) CAPS capsule Take 1 capsule (50,000 Units total) by mouth every 7 (seven) days. 4 capsule 0   ferrous sulfate 325 (65 FE) MG tablet Take 1 tablet (325 mg total) by mouth every other day. (Patient taking differently: Take 325 mg by mouth every Monday, Wednesday, and Friday.) 45 tablet 1   No facility-administered medications prior to visit.    Allergies  Allergen Reactions   Bee Pollen Hives, Shortness Of Breath and Swelling   Bee Venom Hives, Shortness Of Breath and Swelling   Hydrocodone-Acetaminophen Anaphylaxis    No problem when she takes Tylenol   Peach Flavor Hives, Shortness Of Breath and Other (See Comments)    SWELLING OF MOUTH   Pollen Extract Hives, Shortness Of Breath and Swelling   Remdesivir Swelling    Angioedema 8/25   Cortisone  Itching and Swelling   Cortizone-10 [Hydrocortisone]     Swelling and itching    ROS Review of Systems  Constitutional:  Negative for chills and fever.  Genitourinary:  Negative for hematuria, pelvic pain, vaginal discharge and vaginal pain.     Objective:    Physical Exam Vitals reviewed.  Cardiovascular:     Rate and Rhythm: Normal rate and regular rhythm.  Pulmonary:     Effort: Pulmonary effort is normal.     Breath sounds: Normal breath sounds.  Neurological:     Mental Status: She is alert.    BP 130/80 (BP Location: Left Arm, Patient Position: Sitting, Cuff Size: Normal)   Pulse (!) 102   Temp 98.4 F (36.9 C) (Oral)   Wt (!) 408 lb 12.8 oz (185.4 kg)   LMP 03/22/2021 (Exact Date)   SpO2 97%   BMI 51.78 kg/m  Wt Readings from Last 3 Encounters:  04/08/21 (!) 408 lb 12.8 oz (185.4 kg)  04/01/21 (!) 408 lb (185.1 kg)  03/26/21 (!) 477 lb (216.4 kg)     Health Maintenance Due  Topic Date Due   Pneumococcal Vaccine 46-39 Years old (1 - PCV) Never done   COVID-19 Vaccine (3 - Pfizer risk series) 09/14/2020    There are no preventive care reminders to display for this patient.  Lab Results  Component Value Date   TSH 1.29 03/20/2021   Lab Results  Component Value Date   WBC 7.6 03/20/2021   HGB 12.6  03/20/2021   HCT 39.3 03/20/2021   MCV 71.8 (L) 03/20/2021   PLT 273.0 03/20/2021   Lab Results  Component Value Date   NA 145 03/20/2021   K 4.3 03/20/2021   CO2 20 03/20/2021   GLUCOSE 91 03/20/2021   BUN 10 03/20/2021   CREATININE 0.59 03/20/2021   BILITOT 0.4 03/20/2021   ALKPHOS 110 03/20/2021   AST 19 03/20/2021   ALT 18 03/20/2021   PROT 7.4 03/20/2021   ALBUMIN 4.2 03/20/2021   CALCIUM 9.5 03/20/2021   ANIONGAP 9 02/24/2021   GFR 127.70 03/20/2021   Lab Results  Component Value Date   CHOL 118 11/08/2020   Lab Results  Component Value Date   HDL 36.00 (L) 11/08/2020   Lab Results  Component Value Date   LDLCALC 69 11/08/2020    Lab Results  Component Value Date   TRIG 64.0 11/08/2020   Lab Results  Component Value Date   CHOLHDL 3 11/08/2020   Lab Results  Component Value Date   HGBA1C 6.1 11/08/2020      Assessment & Plan:   Patient requesting STD testing.  Mostly asymptomatic with the exception of couple days of some mild urine frequency but no burning.  -Check HIV and RPR -Check urine for GC, chlamydia, and trichomonas -She has IUD in place as above and plans to continue with barrier protection to reduce STD risk   No orders of the defined types were placed in this encounter.   Follow-up: No follow-ups on file.    Carolann Littler, MD

## 2021-04-09 ENCOUNTER — Ambulatory Visit: Payer: Medicaid Other | Admitting: Family Medicine

## 2021-04-09 ENCOUNTER — Ambulatory Visit: Payer: Medicaid Other

## 2021-04-09 DIAGNOSIS — R293 Abnormal posture: Secondary | ICD-10-CM | POA: Diagnosis not present

## 2021-04-09 DIAGNOSIS — M25572 Pain in left ankle and joints of left foot: Secondary | ICD-10-CM | POA: Diagnosis not present

## 2021-04-09 DIAGNOSIS — R262 Difficulty in walking, not elsewhere classified: Secondary | ICD-10-CM | POA: Diagnosis not present

## 2021-04-09 DIAGNOSIS — M25671 Stiffness of right ankle, not elsewhere classified: Secondary | ICD-10-CM | POA: Diagnosis not present

## 2021-04-09 DIAGNOSIS — G8929 Other chronic pain: Secondary | ICD-10-CM

## 2021-04-09 DIAGNOSIS — M25571 Pain in right ankle and joints of right foot: Secondary | ICD-10-CM | POA: Diagnosis not present

## 2021-04-09 DIAGNOSIS — M545 Low back pain, unspecified: Secondary | ICD-10-CM

## 2021-04-09 DIAGNOSIS — M542 Cervicalgia: Secondary | ICD-10-CM | POA: Diagnosis not present

## 2021-04-09 DIAGNOSIS — S82891A Other fracture of right lower leg, initial encounter for closed fracture: Secondary | ICD-10-CM | POA: Diagnosis not present

## 2021-04-09 LAB — HIV ANTIBODY (ROUTINE TESTING W REFLEX): HIV 1&2 Ab, 4th Generation: NONREACTIVE

## 2021-04-09 LAB — RPR: RPR Ser Ql: NONREACTIVE

## 2021-04-09 NOTE — Therapy (Signed)
[place], [place], [address] Phone: [phone]   Fax:  [phone]  Physical Therapy Treatment  Patient Details  Name: Maureen Lewis Claire MRN: [id number] Date of Birth: Jan 08, 1996 Referring Provider (PT): Gregor Hams,   Encounter Date: 04/09/2021   PT End of Session - 04/09/21 1655     Visit Number 4    Number of Visits 17    Date for PT Re-Evaluation 05/11/21    Authorization Type Medicaid [employer]    Authorization Time Period 6/1-7/30    Authorization - Visit Number 3    Authorization - Number of Visits 16    PT Start Time 1700    PT Stop Time 1742    PT Time Calculation (min) 42 min    Activity Tolerance Patient tolerated treatment well    Behavior During Therapy WFL for tasks assessed/performed             Past Medical History:  Diagnosis Date   Abnormal uterine bleeding    Acid reflux    Alpha thalassemia trait    Amenorrhea    Anemia    no current med.   Back pain    Cesarean delivery delivered 10/11/2019   10/09/2019 - primary CS for failed IOL   Chest pain    resolved   Complication of anesthesia    states had to keep giving her anesthesia during EGD   Constipation    Depression    "I'm good"   Fibromyalgia    Finger mass, left 03/2017   middle finger   Gestational diabetes 05/23/2019   hx - with pregnancy only    Gestational hypertension 09/23/2019   Guidelines for Antenatal Testing and Sonography  (with updated ICD-10 codes)  Updated  2019-09-23 with Dr. Tama High  INDICATION U/S 2 X week NST/AFI  or full BPP wkly DELIVERY Diabetes   A1 - good control - O24.410    A2 - good control - O24.419      A2  - poor control or poor compliance - O24.419, E11.65   (Macrosomia or polyhydramnios) **E11.65 is extra code for poor control**    A2/B - O24.   Headache    Hypertension    Infection    UTI   Irritable bowel syndrome (IBS)    Joint pain    Morbid obesity with  body mass index (BMI) of 45.0 to 49.9 in adult [facility])    Obesity during pregnancy, antepartum 04/06/2019   Body mass index is 53.71 kg/m.  Recommendations [x]  Aspirin 81 mg daily after 12 weeks; discontinue after 36 weeks [ ]  Nutrition consult [ ]  Weight gain 11-20 lbs for singleton and 25-35 lbs for twin pregnancy (IOM guidelines) Higher class of obesity patients recommended to gain closer to lower limit  Weight loss is associated with adverse outcomes [ ]  Baseline and surveillance labs (pulled in fr   Plantar fasciitis, bilateral    resolved   Polycystic ovary syndrome    Pre-diabetes    no meds, diet controlled, diet not check blood sugar   Prediabetes    "prediabetes"   Pregnancy induced hypertension    Shortness of breath    albuterol inhaler   Sleep apnea    no CPAP use   Supervision of normal first pregnancy 04/06/2019   [name]: [ x]Initial [ x]12 [ ] 20 [ ] 28 [ ] 32 [ ] 36 [ ] 38 [ ] 39 [ ] 40 Nursing Staff Provider Office Location [facility]  Dating   LMP Language  English  Anatomy US  Nml Flu Vaccine  Declined 07/28/19 Genetic Screen  NIPS: low risks   AFP:   negative  TDaP vaccine   08/25/19 Hgb A1C or  GTT Early A1C 5.8 Third trimester: GDM insulin Rhogam  NA   LAB RESULTS  Feeding Plan Breast Blood Type   Vertigo 2017    Past Surgical History:  Procedure Laterality Date   CESAREAN SECTION N/A 10/09/2019   Procedure: CESAREAN SECTION;  Surgeon: Aletha Halim, MD;  Location: [place] LD ORS;  Service: Obstetrics;  Laterality: N/A;   COLONOSCOPY WITH PROPOFOL  10/07/2016   DILATION AND CURETTAGE OF UTERUS     ESOPHAGOGASTRODUODENOSCOPY  12/21/2015   EXCISION MASS UPPER EXTREMETIES Left 04/21/2017   Procedure: EXCISION MASS LEFT MIDDLE FINGER;  Surgeon: Daryll Brod, MD;  Location: [place];  Service: Orthopedics;  Laterality: Left;   IR FL GUIDED LOC OF NEEDLE/CATH TIP FOR SPINAL INJECTION RT  03/13/2020   PHOTOCOAGULATION WITH LASER Left 12/13/2020   Procedure:  RETINOPLEXY LEFT EYE;  Surgeon: Sherlynn Stalls, MD;  Location: [place];  Service: Ophthalmology;  Laterality: Left;   SCLERAL BUCKLE Right 12/13/2020   Procedure: SCLERAL BUCKLE WITH CRYO;  Surgeon: Sherlynn Stalls, MD;  Location: [place];  Service: Ophthalmology;  Laterality: Right;    There were no vitals filed for this visit.   Subjective Assessment - 04/09/21 1659     Subjective "The neck and back are ok, it's just still my ankle, mainly the right one today." She reports compliance with HEP.    Currently in Pain? Yes    Pain Score 7     Pain Location Ankle    Pain Orientation Right;Lateral    Pain Descriptors / Indicators --   pulling   Pain Type Chronic pain    Pain Onset More than a month ago    Pain Frequency Constant    Multiple Pain Sites No                               [facility] Adult PT Treatment/Exercise - 04/09/21 0001       Ambulation/Gait   Ambulation/Gait Yes    Ambulation Distance (Feet) --   trials of 15 ft   Pre-Gait Activities forward step overs, focusing on heel strike and foot flat over hurdle 1 x 10 each    Gait Comments limited pronation, limited push-off Rt      Self-Care   Other Self-Care Comments  see patient education      Lumbar Exercises: Stretches   Active Hamstring Stretch 30 seconds    Active Hamstring Stretch Limitations x2; left    Figure 4 Stretch 30 seconds    Figure 4 Stretch Limitations left      Lumbar Exercises: Supine   Bridge with [employer] 10 reps    Bridge with [employer] Limitations x2      Knee/Hip Exercises: Sidelying   Clams 2 x 10 blue band      Ankle Exercises: Clinical research associate 30 seconds;2 reps   with strap     Ankle Exercises: Seated   Marble Pickup 15 marbles bilateral      Ankle Exercises: Standing   Toe Raise 15 reps   x2   Heel Walk (Round Trip) 5 ft    Other Standing Ankle Exercises --  PT Education - 04/09/21 1740     Education Details Updated  HEP.    Person(s) Educated Patient    Methods Explanation;Demonstration;Verbal cues;Handout    Comprehension Verbalized understanding;Returned demonstration;Verbal cues required              PT Short Term Goals - 04/06/21 1207       PT SHORT TERM GOAL #1   Title Patient will be independent with initial HEP.    Status Achieved    Target Date 04/06/21      PT SHORT TERM GOAL #2   Title Patient will demonstrate at least 10 degrees of Rt ankle dorsiflexion AROM to improve gait mechanics.    Status Achieved    Target Date 04/06/21      PT SHORT TERM GOAL #3   Title Patient will demonstrate 5/5 Rt ankle inversion strength to improve stability with walking.    Status Achieved    Target Date 04/06/21               PT Long Term Goals - 03/12/21 1848       PT LONG TERM GOAL #1   Title Patient will be independent with advanced HEP to manage her chronic conditions.    Baseline no HEP.    Time 8    Period Weeks    Status New    Target Date 05/07/21      PT LONG TERM GOAL #2   Title Patient will complete 5 x STS in </=13 seconds for improve functional strength and to signify decreased fall risk.    Baseline 17.3 seconds    Time 8    Period Weeks    Status New    Target Date 05/07/21      PT LONG TERM GOAL #3   Title Patient will maintain Rt SLS for at least 20 seconds to improve stability when walking on uneven surfaces.    Baseline 15 seconds    Time 8    Period Weeks    Status New    Target Date 05/07/21      PT LONG TERM GOAL #4   Title Patient will report ability to tolerate walking/standing for at least 1 hour with pain less than 2/10 to improve tolerance to work activity.    Baseline 5 minutes, current pain 5-6/10    Time 8    Period Weeks    Status New    Target Date 05/07/21                   Plan - 04/09/21 1701     Clinical Impression Statement Patient arrives with chief complaint of Rt ankle pain likle exacerbated by prolonged standing  at work today. Able to progress hip strengthening without reports of pain, though occasional cramping in the Lt hamstring and hip that was resolved with rest. Began addressing gait abnormalities focusing on heel strike, foot flat, and push-off with forward step overs with patient able to perform proper sequencing with step over, though little carry over with gait training in clinic. She has difficulty maintaining neutral position with calf raise as she has tendency to maintain excessive supination when performing.    Personal Factors and Comorbidities Age;Comorbidity 3+;Fitness;Profession;Time since onset of injury/illness/exacerbation    Comorbidities alpha thalassemia trait, anemia, back pain, depression, gestational diabetes, obesity, bilat plantar fasciitis, OSA, vertigo    Examination-Activity Limitations Stand;Locomotion Level;Lift    Examination-Participation Restrictions Occupation    Stability/Clinical Decision Making Evolving/Moderate complexity  Rehab Potential Fair    PT Frequency 2x / week    PT Duration 8 weeks    PT Treatment/Interventions Moist Heat;Gait training;Stair training;Functional mobility training;Therapeutic activities;Therapeutic exercise;Balance training;Manual techniques;Patient/family education;Passive range of motion;Dry needling;Taping;ADLs/Self Care Home Management;Aquatic Therapy;Cryotherapy;Neuromuscular re-education    PT Next Visit Plan review HEP, gait training, balance, general UE/LE strengthening    PT Home Exercise Plan Access Code: [email or web address]    Consulted and Agree with Plan of Care Patient             Patient will benefit from skilled therapeutic intervention in order to improve the following deficits and impairments:  Difficulty walking, Abnormal gait, Decreased range of motion, Obesity, Pain, Postural dysfunction  Visit Diagnosis: Chronic bilateral low back pain, unspecified whether sciatica present  Cervicalgia  Chronic pain of both  ankles  Difficulty in walking, not elsewhere classified  Abnormal posture     Problem List Patient Active Problem List   Diagnosis Date Noted   Encounter for insertion of mirena IUD 04/01/2021   Levator spasm 11/21/2020   Galactorrhea 11/19/2020   Hx of migraine headaches 04/09/2020   Contraceptive management 11/22/2019   Positive RPR test 10/09/2019   HTN (hypertension) 09/23/2019   Migraine headache 07/12/2019   Alpha thalassemia trait    Vitamin D deficiency 09/16/2017   Prediabetes 09/16/2017   Depression 09/16/2017   Class 3 severe obesity with serious comorbidity and body mass index (BMI) of 50.0 to 59.9 in adult ([place]) 09/16/2017   PCOS (polycystic ovarian syndrome) 08/13/2017   Mass 05/06/2017   Vertigo 10/06/2016   Microcytic anemia 09/26/2016   Pseudotumor cerebri syndrome 08/28/2016   Obesity hypoventilation syndrome ([place]) 08/28/2016   Benign paroxysmal positional vertigo due to bilateral vestibular disorder 08/28/2016   OSA (obstructive sleep apnea) 08/28/2016   Hypersomnia with sleep apnea 08/28/2016   Female hirsutism 08/28/2016   GERD (gastroesophageal reflux disease) 02/24/2015   Constipation 10/02/2014   Gwendolyn Grant, PT, DPT, ATC 04/09/21 5:45 PM   [facility] [address] [place], [place], [address] Phone: [phone]   Fax:  [phone]  Name: Genine Beckett Welford MRN: [id number] Date of Birth: 12/13/1995

## 2021-04-09 NOTE — Progress Notes (Deleted)
   I, Wendy Poet, LAT, ATC, am serving as scribe for Dr. [name].  Maureen Lewis is a 25 y.o. female who presents to [place] at [facility] today for f/u of B ankle pain.  She had been being followed for her chronic R ankle pain due to a malleolus fx from Sept 2021 that was re-irritated when she fell down some stairs on 01/09/21.  She then c/o L ankle pain in addition to her R ankle pain at her last visit w/ Dr. Georgina Snell on 03/14/20.  She had a R ankle injection at her last visit and has completed 3 PT sessions.  Since her last visit, pt reports that her R ankle "buckled" when she was walking to the bathroom on 03/29/21.  She was seen at the [facility] that same day.  She called our office and was advised to purchase a new walking boot as pt stated that she no longer had her previous boot from her earlier injury in fall 2021.  Today, pt reports   Diagnostic imaging: R ankle XR- 03/29/21, 02/14/21; L ankle XR- 03/14/21;  R ankle MRI-09/07/20 Pertinent review of systems: ***  Relevant historical information: ***   Exam:  LMP 03/22/2021 (Exact Date)  General: Well Developed, well nourished, and in no acute distress.   MSK:     Lab and Radiology Results No results found for this or any previous visit (from the past 72 hour(s)). No results found.     Assessment and Plan: 25 y.o. female with ***   PDMP not reviewed this encounter. No orders of the defined types were placed in this encounter.  No orders of the defined types were placed in this encounter.    Discussed warning signs or symptoms. Please see discharge instructions. Patient expresses understanding.

## 2021-04-10 LAB — URINE CYTOLOGY ANCILLARY ONLY
Chlamydia: NEGATIVE
Comment: NEGATIVE
Comment: NEGATIVE
Comment: NORMAL
Neisseria Gonorrhea: NEGATIVE
Trichomonas: NEGATIVE

## 2021-04-11 ENCOUNTER — Ambulatory Visit: Payer: Medicaid Other

## 2021-04-11 ENCOUNTER — Ambulatory Visit (INDEPENDENT_AMBULATORY_CARE_PROVIDER_SITE_OTHER): Payer: Medicaid Other | Admitting: Family Medicine

## 2021-04-11 ENCOUNTER — Other Ambulatory Visit: Payer: Self-pay

## 2021-04-11 ENCOUNTER — Encounter (INDEPENDENT_AMBULATORY_CARE_PROVIDER_SITE_OTHER): Payer: Self-pay | Admitting: Family Medicine

## 2021-04-11 VITALS — BP 129/78 | HR 94 | Temp 98.5°F | Ht 74.0 in | Wt >= 6400 oz

## 2021-04-11 DIAGNOSIS — R293 Abnormal posture: Secondary | ICD-10-CM | POA: Diagnosis not present

## 2021-04-11 DIAGNOSIS — R262 Difficulty in walking, not elsewhere classified: Secondary | ICD-10-CM

## 2021-04-11 DIAGNOSIS — E559 Vitamin D deficiency, unspecified: Secondary | ICD-10-CM | POA: Diagnosis not present

## 2021-04-11 DIAGNOSIS — M25671 Stiffness of right ankle, not elsewhere classified: Secondary | ICD-10-CM | POA: Diagnosis not present

## 2021-04-11 DIAGNOSIS — M25571 Pain in right ankle and joints of right foot: Secondary | ICD-10-CM | POA: Diagnosis not present

## 2021-04-11 DIAGNOSIS — M542 Cervicalgia: Secondary | ICD-10-CM | POA: Diagnosis not present

## 2021-04-11 DIAGNOSIS — S82891A Other fracture of right lower leg, initial encounter for closed fracture: Secondary | ICD-10-CM | POA: Diagnosis not present

## 2021-04-11 DIAGNOSIS — E8881 Metabolic syndrome: Secondary | ICD-10-CM | POA: Diagnosis not present

## 2021-04-11 DIAGNOSIS — M545 Low back pain, unspecified: Secondary | ICD-10-CM

## 2021-04-11 DIAGNOSIS — Z6841 Body Mass Index (BMI) 40.0 and over, adult: Secondary | ICD-10-CM

## 2021-04-11 DIAGNOSIS — G8929 Other chronic pain: Secondary | ICD-10-CM

## 2021-04-11 DIAGNOSIS — M25572 Pain in left ankle and joints of left foot: Secondary | ICD-10-CM | POA: Diagnosis not present

## 2021-04-11 MED ORDER — VITAMIN D (ERGOCALCIFEROL) 1.25 MG (50000 UNIT) PO CAPS: 50000.0000 [IU] | ORAL_CAPSULE | ORAL | 0 refills | Status: DC

## 2021-04-11 MED ORDER — VICTOZA 18 MG/3ML ~~LOC~~ SOPN: 1.8000 mg | PEN_INJECTOR | Freq: Every day | SUBCUTANEOUS | 0 refills | Status: DC

## 2021-04-11 NOTE — Therapy (Signed)
[place], [place], [address] Phone: [phone]   Fax:  [phone]  Physical Therapy Treatment  Patient Details  Name: Maureen Lewis MRN: [id number] Date of Birth: 1996-01-22 Referring Provider (PT): Gregor Hams,   Encounter Date: 04/11/2021   PT End of Session - 04/11/21 1750     Visit Number 5    Number of Visits 17    Date for PT Re-Evaluation 05/11/21    Authorization Type Medicaid [employer]    Authorization Time Period 6/1-7/30    Authorization - Visit Number 4    Authorization - Number of Visits 16    PT Start Time 7425    PT Stop Time 1828    PT Time Calculation (min) 43 min    Activity Tolerance Patient tolerated treatment well    Behavior During Therapy WFL for tasks assessed/performed             Past Medical History:  Diagnosis Date   Abnormal uterine bleeding    Acid reflux    Alpha thalassemia trait    Amenorrhea    Anemia    no current med.   Back pain    Cesarean delivery delivered 10/11/2019   10/09/2019 - primary CS for failed IOL   Chest pain    resolved   Complication of anesthesia    states had to keep giving her anesthesia during EGD   Constipation    Depression    "I'm good"   Fibromyalgia    Finger mass, left 03/2017   middle finger   Gestational diabetes 05/23/2019   hx - with pregnancy only    Gestational hypertension 09/23/2019   Guidelines for Antenatal Testing and Sonography  (with updated ICD-10 codes)  Updated  14-Sep-2019 with Dr. Tama High  INDICATION U/S 2 X week NST/AFI  or full BPP wkly DELIVERY Diabetes   A1 - good control - O24.410    A2 - good control - O24.419      A2  - poor control or poor compliance - O24.419, E11.65   (Macrosomia or polyhydramnios) **E11.65 is extra code for poor control**    A2/B - O24.   Headache    Hypertension    Infection    UTI   Irritable bowel syndrome (IBS)    Joint pain    Morbid obesity with  body mass index (BMI) of 45.0 to 49.9 in adult [facility])    Obesity during pregnancy, antepartum 04/06/2019   Body mass index is 53.71 kg/m.  Recommendations [x]  Aspirin 81 mg daily after 12 weeks; discontinue after 36 weeks [ ]  Nutrition consult [ ]  Weight gain 11-20 lbs for singleton and 25-35 lbs for twin pregnancy (IOM guidelines) Higher class of obesity patients recommended to gain closer to lower limit  Weight loss is associated with adverse outcomes [ ]  Baseline and surveillance labs (pulled in fr   Plantar fasciitis, bilateral    resolved   Polycystic ovary syndrome    Pre-diabetes    no meds, diet controlled, diet not check blood sugar   Prediabetes    "prediabetes"   Pregnancy induced hypertension    Shortness of breath    albuterol inhaler   Sleep apnea    no CPAP use   Supervision of normal first pregnancy 04/06/2019   [name]: [ x]Initial [ x]12 [ ] 20 [ ] 28 [ ] 32 [ ] 36 [ ] 38 [ ] 39 [ ] 40 Nursing Staff Provider Office Location [facility]  Dating   LMP Language  English  Anatomy US  Nml Flu Vaccine  Declined 07/28/19 Genetic Screen  NIPS: low risks   AFP:   negative  TDaP vaccine   08/25/19 Hgb A1C or  GTT Early A1C 5.8 Third trimester: GDM insulin Rhogam  NA   LAB RESULTS  Feeding Plan Breast Blood Type   Vertigo 2017    Past Surgical History:  Procedure Laterality Date   CESAREAN SECTION N/A 10/09/2019   Procedure: CESAREAN SECTION;  Surgeon: Aletha Halim, MD;  Location: [place] LD ORS;  Service: Obstetrics;  Laterality: N/A;   COLONOSCOPY WITH PROPOFOL  10/07/2016   DILATION AND CURETTAGE OF UTERUS     ESOPHAGOGASTRODUODENOSCOPY  12/21/2015   EXCISION MASS UPPER EXTREMETIES Left 04/21/2017   Procedure: EXCISION MASS LEFT MIDDLE FINGER;  Surgeon: Daryll Brod, MD;  Location: [place];  Service: Orthopedics;  Laterality: Left;   IR FL GUIDED LOC OF NEEDLE/CATH TIP FOR SPINAL INJECTION RT  03/13/2020   PHOTOCOAGULATION WITH LASER Left 12/13/2020   Procedure:  RETINOPLEXY LEFT EYE;  Surgeon: Sherlynn Stalls, MD;  Location: [place];  Service: Ophthalmology;  Laterality: Left;   SCLERAL BUCKLE Right 12/13/2020   Procedure: SCLERAL BUCKLE WITH CRYO;  Surgeon: Sherlynn Stalls, MD;  Location: [place];  Service: Ophthalmology;  Laterality: Right;    There were no vitals filed for this visit.   Subjective Assessment - 04/11/21 1747     Subjective "I'm doing ok." She reports compliance with HEP.    Currently in Pain? Yes    Pain Score 3     Pain Location Ankle    Pain Orientation Right;Lateral    Pain Descriptors / Indicators --   grinding   Pain Type Chronic pain    Pain Onset More than a month ago    Pain Frequency Constant    Multiple Pain Sites Yes    Pain Score 3    Pain Location Back    Pain Orientation Lower    Pain Descriptors / Indicators Sharp   pulling   Pain Type Chronic pain    Pain Onset More than a month ago    Pain Frequency Intermittent                [facility] PT Assessment - 04/11/21 0001       Transfers   Five time sit to stand comments  17.3                           [facility] Adult PT Treatment/Exercise - 04/11/21 0001       Self-Care   Other Self-Care Comments  see patient education      Neuro Re-ed    Neuro Re-ed Details  tandem 2 x 30 sec each; SLS 2 x 30 sec; romberg on foam EC (barefoot) 2 x 15 sec (increased pain Rt ankle)      Lumbar Exercises: Stretches   Lower Trunk Rotation 60 seconds    Other Lumbar Stretch Exercise stability ball rollout 1 min      Lumbar Exercises: Supine   Straight Leg Raise 10 reps    Straight Leg Raises Limitations x2; 2 lbs      Knee/Hip Exercises: Standing   Heel Raises 10 reps    Heel Raises Limitations x2      Knee/Hip Exercises: Seated   Long Arc Quad 10 reps;2 sets    Long Arc Quad Weight 2 lbs.  Knee/Hip Exercises: Sidelying   Other Sidelying Knee/Hip Exercises circles CW/CCW 2 x 10 each                    PT Education - 04/11/21  1824     Education Details Updated HEP.    Person(s) Educated Patient    Methods Explanation;Demonstration;Verbal cues;Handout;Tactile cues    Comprehension Verbalized understanding;Returned demonstration              PT Short Term Goals - 04/06/21 1207       PT SHORT TERM GOAL #1   Title Patient will be independent with initial HEP.    Status Achieved    Target Date 04/06/21      PT SHORT TERM GOAL #2   Title Patient will demonstrate at least 10 degrees of Rt ankle dorsiflexion AROM to improve gait mechanics.    Status Achieved    Target Date 04/06/21      PT SHORT TERM GOAL #3   Title Patient will demonstrate 5/5 Rt ankle inversion strength to improve stability with walking.    Status Achieved    Target Date 04/06/21               PT Long Term Goals - 03/12/21 1848       PT LONG TERM GOAL #1   Title Patient will be independent with advanced HEP to manage her chronic conditions.    Baseline no HEP.    Time 8    Period Weeks    Status New    Target Date 05/07/21      PT LONG TERM GOAL #2   Title Patient will complete 5 x STS in </=13 seconds for improve functional strength and to signify decreased fall risk.    Baseline 17.3 seconds    Time 8    Period Weeks    Status New    Target Date 05/07/21      PT LONG TERM GOAL #3   Title Patient will maintain Rt SLS for at least 20 seconds to improve stability when walking on uneven surfaces.    Baseline 15 seconds    Time 8    Period Weeks    Status New    Target Date 05/07/21      PT LONG TERM GOAL #4   Title Patient will report ability to tolerate walking/standing for at least 1 hour with pain less than 2/10 to improve tolerance to work activity.    Baseline 5 minutes, current pain 5-6/10    Time 8    Period Weeks    Status New    Target Date 05/07/21                   Plan - 04/11/21 1752     Clinical Impression Statement Patient arrives with complaints of mild low back and Rt ankle  pain. Continued to focus on hip strengthening for proximal stability and completed balance training. She has good postural stability with static balance activity, though demonstrates excessive supination when maintaining SLS bilaterally. When instructed to maintain neutral positioning during SLS she has decreased stability requiring occasional use of UE to maintain balance. She tolerated session well today with exception of romberg on foam as this was attempted barefoot causing increased pain about the Rt lateral ankle that subsided by the end of the session.    Personal Factors and Comorbidities Age;Comorbidity 3+;Fitness;Profession;Time since onset of injury/illness/exacerbation    Comorbidities alpha thalassemia trait, anemia, back  pain, depression, gestational diabetes, obesity, bilat plantar fasciitis, OSA, vertigo    Examination-Activity Limitations Stand;Locomotion Level;Lift    Examination-Participation Restrictions Occupation    Stability/Clinical Decision Making Evolving/Moderate complexity    Rehab Potential Fair    PT Frequency 2x / week    PT Duration 8 weeks    PT Treatment/Interventions Moist Heat;Gait training;Stair training;Functional mobility training;Therapeutic activities;Therapeutic exercise;Balance training;Manual techniques;Patient/family education;Passive range of motion;Dry needling;Taping;ADLs/Self Care Home Management;Aquatic Therapy;Cryotherapy;Neuromuscular re-education    PT Next Visit Plan review HEP, gait training, balance, general UE/LE strengthening    PT Home Exercise Plan Access Code: [email or web address]    Consulted and Agree with Plan of Care Patient             Patient will benefit from skilled therapeutic intervention in order to improve the following deficits and impairments:  Difficulty walking, Abnormal gait, Decreased range of motion, Obesity, Pain, Postural dysfunction  Visit Diagnosis: Chronic bilateral low back pain, unspecified whether sciatica  present  Cervicalgia  Chronic pain of both ankles  Difficulty in walking, not elsewhere classified  Abnormal posture     Problem List Patient Active Problem List   Diagnosis Date Noted   Encounter for insertion of mirena IUD 04/01/2021   Levator spasm 11/21/2020   Galactorrhea 11/19/2020   Hx of migraine headaches 04/09/2020   Contraceptive management 11/22/2019   Positive RPR test 10/09/2019   HTN (hypertension) 09/23/2019   Migraine headache 07/12/2019   Alpha thalassemia trait    Vitamin D deficiency 09/16/2017   Prediabetes 09/16/2017   Depression 09/16/2017   Class 3 severe obesity with serious comorbidity and body mass index (BMI) of 50.0 to 59.9 in adult ([place]) 09/16/2017   PCOS (polycystic ovarian syndrome) 08/13/2017   Mass 05/06/2017   Vertigo 10/06/2016   Microcytic anemia 09/26/2016   Pseudotumor cerebri syndrome 08/28/2016   Obesity hypoventilation syndrome ([place]) 08/28/2016   Benign paroxysmal positional vertigo due to bilateral vestibular disorder 08/28/2016   OSA (obstructive sleep apnea) 08/28/2016   Hypersomnia with sleep apnea 08/28/2016   Female hirsutism 08/28/2016   GERD (gastroesophageal reflux disease) 02/24/2015   Constipation 10/02/2014   Gwendolyn Grant, PT, DPT, ATC 04/11/21 6:31 PM   [facility] [address] [place], [place], [address] Phone: [phone]   Fax:  [phone]  Name: Maureen Lewis MRN: [id number] Date of Birth: 07/11/1996

## 2021-04-16 ENCOUNTER — Ambulatory Visit: Payer: Medicaid Other

## 2021-04-16 NOTE — Progress Notes (Signed)
Chief Complaint:   OBESITY Maureen Lewis is here to discuss her progress with her obesity treatment plan along with follow-up of her obesity related diagnoses. Maureen Lewis is on the Category 3 Plan and the Category 4 Plan and states she is following her eating plan approximately 85% of the time. Maureen Lewis states she is walking 30 minutes 3 times per week.  Today's visit was #: 9 Starting weight: 437 lbs Starting date: 04/09/2020 Today's weight: 475 lbs Today's date: 04/11/2021 Total lbs lost to date: 0 Total lbs lost since last in-office visit: 5  Interim History: Maureen Lewis wishes she was losing like she was previously. She is still taking Victoza. She denies hunger. She has 3 boiled eggs in the mornings. Sausage link in AM with 2 pieces of bread. Lunch is a [name] sandwich with cheese and an apple. Dinner is baked chicken or fish with frozen green beans or broccoli. She still needs about 9 oz to work on snack calories. She is getting about 150 calories per day of snacks. She is planning to go to [employer] on the 4th of July.  Subjective:   1. Vitamin D deficiency Maureen Lewis denies nausea, vomiting, and muscle weakness but notes fatigue. Her last Vit D level was 10.8.  2. Metabolic syndrome Maureen Lewis is on Victoza 1.2 mg. She denies GI side effects.  Assessment/Plan:   1. Vitamin D deficiency Low Vitamin D level contributes to fatigue and are associated with obesity, breast, and colon cancer. She agrees to continue to take prescription Vitamin D @50 ,000 IU every week and will follow-up for routine testing of Vitamin D, at least 2-3 times per year to avoid over-replacement.  Refill- Vitamin D, Ergocalciferol, (DRISDOL) 1.25 MG (50000 UNIT) CAPS capsule; Take 1 capsule (50,000 Units total) by mouth every 7 (seven) days.  Dispense: 4 capsule; Refill: 0  2. Metabolic syndrome Increase Victoza to 1.8 mg, as prescribed below. Check labs in 1 month.  - liraglutide (VICTOZA) 18 MG/3ML SOPN; Inject 1.8 mg into the  skin daily. 1.8  Dispense: 6 mL; Refill: 0  3. Class 3 severe obesity with serious comorbidity and body mass index (BMI) of 60.0 to 69.9 in adult, unspecified obesity type (HCC)  Maureen Lewis is currently in the action stage of change. As such, her goal is to continue with weight loss efforts. She has agreed to the Category 4 Plan.   Exercise goals: All adults should avoid inactivity. Some physical activity is better than none, and adults who participate in any amount of physical activity gain some health benefits.  Behavioral modification strategies: increasing lean protein intake, meal planning and cooking strategies, keeping healthy foods in the home, and planning for success.  Maureen Lewis has agreed to follow-up with our clinic in 3 weeks. She was informed of the importance of frequent follow-up visits to maximize her success with intensive lifestyle modifications for her multiple health conditions.   Objective:   Blood pressure 129/78, pulse 94, temperature 98.5 F (36.9 C), height 6\' 2"  (1.88 m), weight (!) 475 lb (215.5 kg), last menstrual period 03/22/2021, SpO2 96 %, not currently breastfeeding. Body mass index is 60.99 kg/m.  General: Cooperative, alert, well developed, in no acute distress. HEENT: Conjunctivae and lids unremarkable. Cardiovascular: Regular rhythm.  Lungs: Normal work of breathing. Neurologic: No focal deficits.   Lab Results  Component Value Date   CREATININE 0.59 03/20/2021   BUN 10 03/20/2021   NA 145 03/20/2021   K 4.3 03/20/2021   CL 107 03/20/2021  CO2 20 03/20/2021   Lab Results  Component Value Date   ALT 18 03/20/2021   AST 19 03/20/2021   ALKPHOS 110 03/20/2021   BILITOT 0.4 03/20/2021   Lab Results  Component Value Date   HGBA1C 6.1 11/08/2020   HGBA1C 6.0 (H) 04/09/2020   HGBA1C 5.8 (H) 04/06/2019   HGBA1C 6.0 04/27/2018   HGBA1C 5.8 (H) 08/13/2017   Lab Results  Component Value Date   INSULIN 36.8 (H) 04/09/2020   INSULIN 42.1 (H)  08/13/2017   Lab Results  Component Value Date   TSH 1.29 03/20/2021   Lab Results  Component Value Date   CHOL 118 11/08/2020   HDL 36.00 (L) 11/08/2020   LDLCALC 69 11/08/2020   TRIG 64.0 11/08/2020   CHOLHDL 3 11/08/2020   Lab Results  Component Value Date   WBC 7.6 03/20/2021   HGB 12.6 03/20/2021   HCT 39.3 03/20/2021   MCV 71.8 (L) 03/20/2021   PLT 273.0 03/20/2021   Lab Results  Component Value Date   IRON 51 01/14/2021   TIBC 384 01/14/2021   FERRITIN 73 01/14/2021   Attestation Statements:   Reviewed by clinician on day of visit: allergies, medications, problem list, medical history, surgical history, family history, social history, and previous encounter notes.  Coral Ceo, CMA, am acting as transcriptionist for Coralie Common, MD.   I have reviewed the above documentation for accuracy and completeness, and I agree with the above. - Jinny Blossom, MD

## 2021-04-17 ENCOUNTER — Ambulatory Visit: Payer: Medicaid Other

## 2021-04-17 ENCOUNTER — Other Ambulatory Visit: Payer: Self-pay

## 2021-04-17 ENCOUNTER — Ambulatory Visit (INDEPENDENT_AMBULATORY_CARE_PROVIDER_SITE_OTHER): Payer: Medicaid Other | Admitting: Family Medicine

## 2021-04-17 ENCOUNTER — Ambulatory Visit: Payer: Medicaid Other | Admitting: Physical Therapy

## 2021-04-17 ENCOUNTER — Encounter: Payer: Self-pay | Admitting: Family Medicine

## 2021-04-17 VITALS — BP 110/78 | HR 94 | Ht 74.0 in | Wt >= 6400 oz

## 2021-04-17 DIAGNOSIS — M545 Low back pain, unspecified: Secondary | ICD-10-CM | POA: Diagnosis not present

## 2021-04-17 DIAGNOSIS — G8929 Other chronic pain: Secondary | ICD-10-CM

## 2021-04-17 DIAGNOSIS — R262 Difficulty in walking, not elsewhere classified: Secondary | ICD-10-CM

## 2021-04-17 DIAGNOSIS — M25571 Pain in right ankle and joints of right foot: Secondary | ICD-10-CM | POA: Diagnosis not present

## 2021-04-17 DIAGNOSIS — S82891A Other fracture of right lower leg, initial encounter for closed fracture: Secondary | ICD-10-CM | POA: Diagnosis not present

## 2021-04-17 DIAGNOSIS — M25572 Pain in left ankle and joints of left foot: Secondary | ICD-10-CM

## 2021-04-17 DIAGNOSIS — M25671 Stiffness of right ankle, not elsewhere classified: Secondary | ICD-10-CM | POA: Diagnosis not present

## 2021-04-17 DIAGNOSIS — R293 Abnormal posture: Secondary | ICD-10-CM | POA: Diagnosis not present

## 2021-04-17 DIAGNOSIS — R600 Localized edema: Secondary | ICD-10-CM

## 2021-04-17 DIAGNOSIS — M542 Cervicalgia: Secondary | ICD-10-CM | POA: Diagnosis not present

## 2021-04-17 NOTE — Patient Instructions (Signed)
Thank you for coming in today.   Continue the exercises.   Recheck with me as needed.   Consider bariatric surgery.

## 2021-04-17 NOTE — Progress Notes (Signed)
I, Wendy Poet, LAT, ATC, am serving as scribe for Dr. [name].  Maureen Lewis is a 25 y.o. female who presents to [place] at [facility] today for f/u of B ankle pain.  Her R ankle pain is chronic in nature due to a malleolus fx from Sept 2021 that was re-irritated when she fell down some stairs on 01/09/21.  She then sprained her L ankle in mid-May 2022.  She was last seen by Dr. Georgina Snell on 03/14/21 and had a R ankle injection.  She was also advised to con't PT and had the L ankle added to her referral of which she has completed 5 sessions.  Since her last visit, pt reports that her ankles are about the same.  Her R ankle con't to bother her the most.  She states that she has begun to have numbness in her R ankle again even w/ weight-bearing activity and she is afraid that she might fall.  She states that she feels like her R ankle isn't getting better and she's frustrated.  She con't to not be able to wear the ASO ankle brace due to it causing her ankle to swell more.  She states that her PT doesn't want her to wear her boot due to this immobilizing her R ankle too much, not allowing her muscles to work.  Diagnostic imaging: R ankle XR- 03/29/21, 02/14/21, 08/07/20, 07/10/20; L ankle XR- 5 26/22; R ankle MRI- 09/07/20   Pertinent review of systems: No fevers or chills  Relevant historical information: Receiving medical supervised weight management   Exam:  BP 110/78 (BP Location: Right Arm, Patient Position: Sitting, Cuff Size: Large)   Pulse 94   Ht 6\' 2"  (1.88 m)   Wt (!) 481 lb (218.2 kg)   LMP 03/22/2021 (Exact Date)   SpO2 95%   BMI 61.76 kg/m  General: Well Developed, well nourished, and in no acute distress.   MSK: Bilateral ankles normal motion.  Normal gait.  Mild edema bilateral lower extremities.    Lab and Radiology Results   EXAM: RIGHT ANKLE - COMPLETE 3+ VIEW   COMPARISON:  None   FINDINGS: The ankle mortise is maintained. No  acute ankle fracture or osteochondral abnormality. No definite ankle joint effusion. The visualized mid and hindfoot bony structures are intact.   IMPRESSION: No acute bony findings.     Electronically Signed   By: Marijo Sanes M.D.   On: 03/29/2021 18:46  EXAM: LEFT ANKLE COMPLETE - 3+ VIEW   COMPARISON:  None.   FINDINGS: There is no evidence of fracture, dislocation, or joint effusion. There is no evidence of arthropathy or other focal bone abnormality. Soft tissues are unremarkable.   IMPRESSION: No acute abnormality of the left ankle.     Electronically Signed   By: Miachel Roux M.D.   On: 03/15/2021 13:53    I, [name], personally (independently) visualized and performed the interpretation of the images attached in this note.    Assessment and Plan: 25 y.o. female with bilateral ankle pain and frequent sprains.  Patient is doing a great job with maximum effort of conservative management with PT and home exercise program.  Did some strategizing on how she can better use her ASO ankle braces.  Compression stockings underneath the brace would probably help.  Showed her how to put this on a little more easily.  Her fundamental issue is that she weighs 481 pounds and will have chronic difficulty  with those ankles until she loses a lot of weight.  She is doing a great job on her own to try to lose weight and receiving excellent medical care for medical supervised weight loss.  However she has had struggles with this despite good efforts.  I think she may be reasonable to consider bariatric surgery.    Discussed warning signs or symptoms. Please see discharge instructions. Patient expresses understanding.   The above documentation has been reviewed and is accurate and complete [name], M.D.  Total encounter time 30 minutes including face-to-face time with the patient and, reviewing past medical record, and charting on the date of service.   Treatment plan and  options

## 2021-04-18 ENCOUNTER — Ambulatory Visit: Payer: Medicaid Other

## 2021-04-18 DIAGNOSIS — S82891A Other fracture of right lower leg, initial encounter for closed fracture: Secondary | ICD-10-CM | POA: Diagnosis not present

## 2021-04-18 DIAGNOSIS — G8929 Other chronic pain: Secondary | ICD-10-CM | POA: Diagnosis not present

## 2021-04-18 DIAGNOSIS — R262 Difficulty in walking, not elsewhere classified: Secondary | ICD-10-CM

## 2021-04-18 DIAGNOSIS — R293 Abnormal posture: Secondary | ICD-10-CM | POA: Diagnosis not present

## 2021-04-18 DIAGNOSIS — M25571 Pain in right ankle and joints of right foot: Secondary | ICD-10-CM | POA: Diagnosis not present

## 2021-04-18 DIAGNOSIS — M542 Cervicalgia: Secondary | ICD-10-CM | POA: Diagnosis not present

## 2021-04-18 DIAGNOSIS — M545 Low back pain, unspecified: Secondary | ICD-10-CM

## 2021-04-18 DIAGNOSIS — M25572 Pain in left ankle and joints of left foot: Secondary | ICD-10-CM

## 2021-04-18 DIAGNOSIS — M25671 Stiffness of right ankle, not elsewhere classified: Secondary | ICD-10-CM | POA: Diagnosis not present

## 2021-04-18 NOTE — Therapy (Signed)
[place], [place], [address] Phone: [phone]   Fax:  [phone]  Physical Therapy Treatment  Patient Details  Name: Maureen Lewis MRN: [id number] Date of Birth: 1996-01-31 Referring Provider (PT): Gregor Hams,   Encounter Date: 04/17/2021   PT End of Session - 04/17/21 1748     Visit Number 6    Number of Visits 17    Date for PT Re-Evaluation 05/11/21    Authorization Type Medicaid [employer]    Authorization Time Period 6/1-7/30    Authorization - Visit Number 5    Authorization - Number of Visits 16    PT Start Time 2876    PT Stop Time 8115    PT Time Calculation (min) 42 min    Activity Tolerance Patient tolerated treatment well    Behavior During Therapy WFL for tasks assessed/performed             Past Medical History:  Diagnosis Date   Abnormal uterine bleeding    Acid reflux    Alpha thalassemia trait    Amenorrhea    Anemia    no current med.   Back pain    Cesarean delivery delivered 10/11/2019   10/09/2019 - primary CS for failed IOL   Chest pain    resolved   Complication of anesthesia    states had to keep giving her anesthesia during EGD   Constipation    Depression    "I'm good"   Fibromyalgia    Finger mass, left 03/2017   middle finger   Gestational diabetes 05/23/2019   hx - with pregnancy only    Gestational hypertension 09/23/2019   Guidelines for Antenatal Testing and Sonography  (with updated ICD-10 codes)  Updated  Sep 15, 2019 with Dr. Tama High  INDICATION U/S 2 X week NST/AFI  or full BPP wkly DELIVERY Diabetes   A1 - good control - O24.410    A2 - good control - O24.419      A2  - poor control or poor compliance - O24.419, E11.65   (Macrosomia or polyhydramnios) **E11.65 is extra code for poor control**    A2/B - O24.   Headache    Hypertension    Infection    UTI   Irritable bowel syndrome (IBS)    Joint pain    Morbid obesity with  body mass index (BMI) of 45.0 to 49.9 in adult [facility])    Obesity during pregnancy, antepartum 04/06/2019   Body mass index is 53.71 kg/m.  Recommendations [x]  Aspirin 81 mg daily after 12 weeks; discontinue after 36 weeks [ ]  Nutrition consult [ ]  Weight gain 11-20 lbs for singleton and 25-35 lbs for twin pregnancy (IOM guidelines) Higher class of obesity patients recommended to gain closer to lower limit  Weight loss is associated with adverse outcomes [ ]  Baseline and surveillance labs (pulled in fr   Plantar fasciitis, bilateral    resolved   Polycystic ovary syndrome    Pre-diabetes    no meds, diet controlled, diet not check blood sugar   Prediabetes    "prediabetes"   Pregnancy induced hypertension    Shortness of breath    albuterol inhaler   Sleep apnea    no CPAP use   Supervision of normal first pregnancy 04/06/2019   [name]: [ x]Initial [ x]12 [ ] 20 [ ] 28 [ ] 32 [ ] 36 [ ] 38 [ ] 39 [ ] 40 Nursing Staff Provider Office Location [facility]  Dating   LMP Language  English  Anatomy US  Nml Flu Vaccine  Declined 07/28/19 Genetic Screen  NIPS: low risks   AFP:   negative  TDaP vaccine   08/25/19 Hgb A1C or  GTT Early A1C 5.8 Third trimester: GDM insulin Rhogam  NA   LAB RESULTS  Feeding Plan Breast Blood Type   Vertigo 2017    Past Surgical History:  Procedure Laterality Date   CESAREAN SECTION N/A 10/09/2019   Procedure: CESAREAN SECTION;  Surgeon: Aletha Halim, MD;  Location: [place] [facility];  Service: Obstetrics;  Laterality: N/A;   COLONOSCOPY WITH PROPOFOL  10/07/2016   DILATION AND CURETTAGE OF UTERUS     ESOPHAGOGASTRODUODENOSCOPY  12/21/2015   EXCISION MASS UPPER EXTREMETIES Left 04/21/2017   Procedure: EXCISION MASS LEFT MIDDLE FINGER;  Surgeon: Daryll Brod, MD;  Location: [place];  Service: Orthopedics;  Laterality: Left;   IR FL GUIDED LOC OF NEEDLE/CATH TIP FOR SPINAL INJECTION RT  03/13/2020   PHOTOCOAGULATION WITH LASER Left 12/13/2020   Procedure:  RETINOPLEXY LEFT EYE;  Surgeon: Sherlynn Stalls, MD;  Location: [place];  Service: Ophthalmology;  Laterality: Left;   SCLERAL BUCKLE Right 12/13/2020   Procedure: SCLERAL BUCKLE WITH CRYO;  Surgeon: Sherlynn Stalls, MD;  Location: [place];  Service: Ophthalmology;  Laterality: Right;    There were no vitals filed for this visit.   Subjective Assessment - 04/17/21 1750     Subjective Patient had f/u with MD today and was encouraged to continue with PT and f/u prn. Patient reports she is feeling ok. Nothing is bother her right now.    Currently in Pain? No/denies                [facility] PT Assessment - 04/18/21 0001       Strength   Right Hip Extension 4-/5    Left Hip Extension 4/5                           [facility] Adult PT Treatment/Exercise - 04/18/21 0001       Lumbar Exercises: Stretches   Sports administrator 60 seconds    Quad Stretch Limitations bilateral    Other Lumbar Stretch Exercise cat/camel 1 x 10      Lumbar Exercises: Standing   Other Standing Lumbar Exercises hip hinge with dowel 2 x 10      Lumbar Exercises: Supine   Straight Leg Raise 10 reps    Straight Leg Raises Limitations x2; with square trace      Lumbar Exercises: Quadruped   Single Arm Raises Limitations unable to maintain      Knee/Hip Exercises: Standing   Functional Squat 10 reps    Functional Squat Limitations 1 set with UE support; 1 set no UE support    Other Standing Knee Exercises goblet squat 10 lb KB 1  x10      Shoulder Exercises: Prone   Retraction 10 reps    Retraction Limitations x2                      PT Short Term Goals - 04/17/21 1814       PT SHORT TERM GOAL #1   Title Patient will be independent with initial HEP.    Time 3    Period Weeks    Status Achieved    Target Date 04/02/21      PT SHORT TERM GOAL #2  Title Patient will demonstrate at least 10 degrees of Rt ankle dorsiflexion AROM to improve gait mechanics.    Time 4    Period Weeks     Status Achieved    Target Date 04/09/21      PT SHORT TERM GOAL #3   Title Patient will demonstrate 5/5 Rt ankle inversion strength to improve stability with walking.    Time 4    Period Weeks    Status Achieved    Target Date 04/09/21      PT SHORT TERM GOAL #4   Title Patient will complete overhead lifting of light weight (<5 lbs) with proper muscle sequencing in order to avoid overuse of upper traps with UE activity.    Baseline significant upper trap compensation with UE MMT.    Time 4    Period Weeks    Status Deferred    Target Date 04/09/21      PT SHORT TERM GOAL #5   Title Patient will squat 20 lbs without increased pain to improve ability to pick up her daughter in standing.    Baseline goblet squat with 10 lbs 04/17/21    Time 4    Period Weeks    Status On-going    Target Date 04/09/21               PT Long Term Goals - 03/12/21 1848       PT LONG TERM GOAL #1   Title Patient will be independent with advanced HEP to manage her chronic conditions.    Baseline no HEP.    Time 8    Period Weeks    Status New    Target Date 05/07/21      PT LONG TERM GOAL #2   Title Patient will complete 5 x STS in </=13 seconds for improve functional strength and to signify decreased fall risk.    Baseline 17.3 seconds    Time 8    Period Weeks    Status New    Target Date 05/07/21      PT LONG TERM GOAL #3   Title Patient will maintain Rt SLS for at least 20 seconds to improve stability when walking on uneven surfaces.    Baseline 15 seconds    Time 8    Period Weeks    Status New    Target Date 05/07/21      PT LONG TERM GOAL #4   Title Patient will report ability to tolerate walking/standing for at least 1 hour with pain less than 2/10 to improve tolerance to work activity.    Baseline 5 minutes, current pain 5-6/10    Time 8    Period Weeks    Status New    Target Date 05/07/21                   Plan - 04/17/21 1817     Clinical  Impression Statement Patient arrives without reports of pain today. Able to progress LE closed chain strengthening focusing on squatting and hip hinge activity. She requires heavy cues initial for proper muscle sequencing with squatting and hip hinge, though with continued practice able to properly perform. She is making progress towards short term squatting goal as she was able to perform goblet squat with 10 lbs during today's session. Attempted quadruped core stabilization training, though patient unable to maintain this position due to discomfort in her arms. Finished with periscapular strengthening with patient reporting mild pain in her  upper traps following prone scapular retraction. Overall good tolerance to today's session reporting no ankle pain throughout session.    Personal Factors and Comorbidities Age;Comorbidity 3+;Fitness;Profession;Time since onset of injury/illness/exacerbation    Comorbidities alpha thalassemia trait, anemia, back pain, depression, gestational diabetes, obesity, bilat plantar fasciitis, OSA, vertigo    Examination-Activity Limitations Stand;Locomotion Level;Lift    Examination-Participation Restrictions Occupation    Stability/Clinical Decision Making Evolving/Moderate complexity    Rehab Potential Fair    PT Frequency 2x / week    PT Duration 8 weeks    PT Treatment/Interventions Moist Heat;Gait training;Stair training;Functional mobility training;Therapeutic activities;Therapeutic exercise;Balance training;Manual techniques;Patient/family education;Passive range of motion;Dry needling;Taping;ADLs/Self Care Home Management;Aquatic Therapy;Cryotherapy;Neuromuscular re-education    PT Next Visit Plan review HEP, gait training, balance, general UE/LE strengthening    PT Home Exercise Plan Access Code: [email or web address]    Consulted and Agree with Plan of Care Patient             Patient will benefit from skilled therapeutic intervention in order to improve the following  deficits and impairments:  Difficulty walking, Abnormal gait, Decreased range of motion, Obesity, Pain, Postural dysfunction  Visit Diagnosis: Chronic bilateral low back pain, unspecified whether sciatica present  Cervicalgia  Chronic pain of both ankles  Difficulty in walking, not elsewhere classified  Abnormal posture     Problem List Patient Active Problem List   Diagnosis Date Noted   Encounter for insertion of mirena IUD 04/01/2021   Levator spasm 11/21/2020   Galactorrhea 11/19/2020   Hx of migraine headaches 04/09/2020   Contraceptive management 11/22/2019   Positive RPR test 10/09/2019   HTN (hypertension) 09/23/2019   Migraine headache 07/12/2019   Alpha thalassemia trait    Vitamin D deficiency 09/16/2017   Prediabetes 09/16/2017   Depression 09/16/2017   Class 3 severe obesity with serious comorbidity and body mass index (BMI) of 50.0 to 59.9 in adult ([place]) 09/16/2017   PCOS (polycystic ovarian syndrome) 08/13/2017   Mass 05/06/2017   Vertigo 10/06/2016   Microcytic anemia 09/26/2016   Pseudotumor cerebri syndrome 08/28/2016   Obesity hypoventilation syndrome ([place]) 08/28/2016   Benign paroxysmal positional vertigo due to bilateral vestibular disorder 08/28/2016   OSA (obstructive sleep apnea) 08/28/2016   Hypersomnia with sleep apnea 08/28/2016   Female hirsutism 08/28/2016   GERD (gastroesophageal reflux disease) 02/24/2015   Constipation 10/02/2014   Gwendolyn Grant, PT, DPT, ATC 04/18/21 9:16 AM  [facility] [address] [place], [place], [address] Phone: [phone]   Fax:  [phone]  Name: Maureen Lewis MRN: [id number] Date of Birth: [birth year]

## 2021-04-18 NOTE — Therapy (Signed)
[place], [place], [address] Phone: [phone]   Fax:  [phone]  Physical Therapy Treatment  Patient Details  Name: Maureen Lewis MRN: [id number] Date of Birth: Dec 24, 1995 Referring Provider (PT): Gregor Hams,   Encounter Date: 04/18/2021   PT End of Session - 04/18/21 1743     Visit Number 7    Number of Visits 17    Date for PT Re-Evaluation 05/11/21    Authorization Type Medicaid [employer]    Authorization Time Period 6/1-7/30    Authorization - Visit Number 6    Authorization - Number of Visits 16    PT Start Time 1742    Activity Tolerance Patient tolerated treatment well    Behavior During Therapy [facility] for tasks assessed/performed             Past Medical History:  Diagnosis Date   Abnormal uterine bleeding    Acid reflux    Alpha thalassemia trait    Amenorrhea    Anemia    no current med.   Back pain    Cesarean delivery delivered 10/11/2019   10/09/2019 - primary CS for failed IOL   Chest pain    resolved   Complication of anesthesia    states had to keep giving her anesthesia during EGD   Constipation    Depression    "I'm good"   Fibromyalgia    Finger mass, left 03/2017   middle finger   Gestational diabetes 05/23/2019   hx - with pregnancy only    Gestational hypertension 09/23/2019   Guidelines for Antenatal Testing and Sonography  (with updated ICD-10 codes)  Updated  09/17/2019 with Dr. Tama High  INDICATION U/S 2 X week NST/AFI  or full BPP wkly DELIVERY Diabetes   A1 - good control - O24.410    A2 - good control - O24.419      A2  - poor control or poor compliance - O24.419, E11.65   (Macrosomia or polyhydramnios) **E11.65 is extra code for poor control**    A2/B - O24.   Headache    Hypertension    Infection    UTI   Irritable bowel syndrome (IBS)    Joint pain    Morbid obesity with body mass index (BMI) of 45.0 to 49.9 in adult [facility])     Obesity during pregnancy, antepartum 04/06/2019   Body mass index is 53.71 kg/m.  Recommendations [x]  Aspirin 81 mg daily after 12 weeks; discontinue after 36 weeks [ ]  Nutrition consult [ ]  Weight gain 11-20 lbs for singleton and 25-35 lbs for twin pregnancy (IOM guidelines) Higher class of obesity patients recommended to gain closer to lower limit  Weight loss is associated with adverse outcomes [ ]  Baseline and surveillance labs (pulled in fr   Plantar fasciitis, bilateral    resolved   Polycystic ovary syndrome    Pre-diabetes    no meds, diet controlled, diet not check blood sugar   Prediabetes    "prediabetes"   Pregnancy induced hypertension    Shortness of breath    albuterol inhaler   Sleep apnea    no CPAP use   Supervision of normal first pregnancy 04/06/2019   [name]: [ x]Initial [ x]12 [ ] 20 [ ] 28 [ ] 32 [ ] 36 [ ] 38 [ ] 39 [ ] 40 Nursing Staff Provider Office Location [facility]  Dating   LMP Language  English  Anatomy US  Nml Flu Vaccine  Declined 07/28/19 Genetic Screen  NIPS: low risks   AFP:   negative  TDaP vaccine   08/25/19 Hgb A1C or  GTT Early A1C 5.8 Third trimester: GDM insulin Rhogam  NA   LAB RESULTS  Feeding Plan Breast Blood Type   Vertigo 2017    Past Surgical History:  Procedure Laterality Date   CESAREAN SECTION N/A 10/09/2019   Procedure: CESAREAN SECTION;  Surgeon: Aletha Halim, MD;  [facility];  Service: Obstetrics;  Laterality: N/A;   COLONOSCOPY WITH PROPOFOL  10/07/2016   DILATION AND CURETTAGE OF UTERUS     ESOPHAGOGASTRODUODENOSCOPY  12/21/2015   EXCISION MASS UPPER EXTREMETIES Left 04/21/2017   Procedure: EXCISION MASS LEFT MIDDLE FINGER;  Surgeon: Daryll Brod, MD;  Location: [place];  Service: Orthopedics;  Laterality: Left;   IR FL GUIDED LOC OF NEEDLE/CATH TIP FOR SPINAL INJECTION RT  03/13/2020   PHOTOCOAGULATION WITH LASER Left 12/13/2020   Procedure: RETINOPLEXY LEFT EYE;  Surgeon: Sherlynn Stalls, MD;   Location: [place];  Service: Ophthalmology;  Laterality: Left;   SCLERAL BUCKLE Right 12/13/2020   Procedure: SCLERAL BUCKLE WITH CRYO;  Surgeon: Sherlynn Stalls, MD;  Location: [place];  Service: Ophthalmology;  Laterality: Right;    There were no vitals filed for this visit.   Subjective Assessment - 04/18/21 1701     Subjective Patient reports soreness in her quads currently from yesterday's session. No pain currently.    Currently in Pain? No/denies                               [facility] Adult PT Treatment/Exercise - 04/18/21 1706       Self-Care   Other Self-Care Comments  see patient education      Neck Exercises: Supine   Neck Retraction 10 reps    Neck Retraction Limitations x2      Lumbar Exercises: Supine   Pelvic Tilt 10 reps    Pelvic Tilt Limitations x2; posterior pelvic tilt    Bent Knee Raise 10 reps    Bent Knee Raise Limitations x2; posterior pelvic tilt      Shoulder Exercises: Supine   Horizontal ABduction 10 reps    Theraband Level (Shoulder Horizontal ABduction) Level 3 (Green)    Horizontal ABduction Limitations x2      Shoulder Exercises: Seated   External Rotation 10 reps    Theraband Level (Shoulder External Rotation) Level 2 (Red)    External Rotation Limitations x2      Shoulder Exercises: Standing   Extension 10 reps    Theraband Level (Shoulder Extension) Level 2 (Red)    Extension Limitations x2    Row 15 reps    Theraband Level (Shoulder Row) Level 3 (Green)    Row Limitations x2    Other Standing Exercises resisted adduction 2 x10 each red band      Shoulder Exercises: Stretch   Other Shoulder Stretches pec stretch on foam roller 60 sec                    PT Education - 04/18/21 1721     Education Details Updated HEP.    Person(s) Educated Patient    Methods Explanation;Demonstration;Tactile cues;Verbal cues;Handout    Comprehension Verbalized understanding;Returned demonstration;Verbal cues  required;Tactile cues required              PT Short Term Goals - 04/17/21 1814  PT SHORT TERM GOAL #1   Title Patient will be independent with initial HEP.    Time 3    Period Weeks    Status Achieved    Target Date 04/02/21      PT SHORT TERM GOAL #2   Title Patient will demonstrate at least 10 degrees of Rt ankle dorsiflexion AROM to improve gait mechanics.    Time 4    Period Weeks    Status Achieved    Target Date 04/09/21      PT SHORT TERM GOAL #3   Title Patient will demonstrate 5/5 Rt ankle inversion strength to improve stability with walking.    Time 4    Period Weeks    Status Achieved    Target Date 04/09/21      PT SHORT TERM GOAL #4   Title Patient will complete overhead lifting of light weight (<5 lbs) with proper muscle sequencing in order to avoid overuse of upper traps with UE activity.    Baseline significant upper trap compensation with UE MMT.    Time 4    Period Weeks    Status Deferred    Target Date 04/09/21      PT SHORT TERM GOAL #5   Title Patient will squat 20 lbs without increased pain to improve ability to pick up her daughter in standing.    Baseline goblet squat with 10 lbs 04/17/21    Time 4    Period Weeks    Status On-going    Target Date 04/09/21               PT Long Term Goals - 03/12/21 1848       PT LONG TERM GOAL #1   Title Patient will be independent with advanced HEP to manage her chronic conditions.    Baseline no HEP.    Time 8    Period Weeks    Status New    Target Date 05/07/21      PT LONG TERM GOAL #2   Title Patient will complete 5 x STS in </=13 seconds for improve functional strength and to signify decreased fall risk.    Baseline 17.3 seconds    Time 8    Period Weeks    Status New    Target Date 05/07/21      PT LONG TERM GOAL #3   Title Patient will maintain Rt SLS for at least 20 seconds to improve stability when walking on uneven surfaces.    Baseline 15 seconds    Time 8     Period Weeks    Status New    Target Date 05/07/21      PT LONG TERM GOAL #4   Title Patient will report ability to tolerate walking/standing for at least 1 hour with pain less than 2/10 to improve tolerance to work activity.    Baseline 5 minutes, current pain 5-6/10    Time 8    Period Weeks    Status New    Target Date 05/07/21                   Plan - 04/18/21 1703     Clinical Impression Statement Patient arrives with soreness in her quads that is lingering from yesterday's PT session, though no complaints of pain. Given LE soreness session focused on core stabilization and periscapular strengthening today with patient tolerating sesion well. Heavy cues required to decrease excessive upper trap engagement with periscapular strengthening  with intermittent ability to correct. Overall good tolerance to today's session without reports of pain.    Personal Factors and Comorbidities Age;Comorbidity 3+;Fitness;Profession;Time since onset of injury/illness/exacerbation    Comorbidities alpha thalassemia trait, anemia, back pain, depression, gestational diabetes, obesity, bilat plantar fasciitis, OSA, vertigo    Examination-Activity Limitations Stand;Locomotion Level;Lift    Examination-Participation Restrictions Occupation    Stability/Clinical Decision Making Evolving/Moderate complexity    Rehab Potential Fair    PT Frequency 2x / week    PT Duration 8 weeks    PT Treatment/Interventions Moist Heat;Gait training;Stair training;Functional mobility training;Therapeutic activities;Therapeutic exercise;Balance training;Manual techniques;Patient/family education;Passive range of motion;Dry needling;Taping;ADLs/Self Care Home Management;Aquatic Therapy;Cryotherapy;Neuromuscular re-education    PT Next Visit Plan gait training, balance, general UE/LE strengthening    PT Home Exercise Plan Access Code: [email or web address]    Consulted and Agree with Plan of Care Patient             Patient  will benefit from skilled therapeutic intervention in order to improve the following deficits and impairments:  Difficulty walking, Abnormal gait, Decreased range of motion, Obesity, Pain, Postural dysfunction  Visit Diagnosis: Chronic bilateral low back pain, unspecified whether sciatica present  Cervicalgia  Chronic pain of both ankles  Difficulty in walking, not elsewhere classified  Abnormal posture     Problem List Patient Active Problem List   Diagnosis Date Noted   Encounter for insertion of mirena IUD 04/01/2021   Levator spasm 11/21/2020   Galactorrhea 11/19/2020   Hx of migraine headaches 04/09/2020   Contraceptive management 11/22/2019   Positive RPR test 10/09/2019   HTN (hypertension) 09/23/2019   Migraine headache 07/12/2019   Alpha thalassemia trait    Vitamin D deficiency 09/16/2017   Prediabetes 09/16/2017   Depression 09/16/2017   Class 3 severe obesity with serious comorbidity and body mass index (BMI) of 50.0 to 59.9 in adult ([place]) 09/16/2017   PCOS (polycystic ovarian syndrome) 08/13/2017   Mass 05/06/2017   Vertigo 10/06/2016   Microcytic anemia 09/26/2016   Pseudotumor cerebri syndrome 08/28/2016   Obesity hypoventilation syndrome ([place]) 08/28/2016   Benign paroxysmal positional vertigo due to bilateral vestibular disorder 08/28/2016   OSA (obstructive sleep apnea) 08/28/2016   Hypersomnia with sleep apnea 08/28/2016   Female hirsutism 08/28/2016   GERD (gastroesophageal reflux disease) 02/24/2015   Constipation 10/02/2014   Gwendolyn Grant, PT, DPT, ATC 04/18/21 5:45 PM   [facility] [address] [place], [place], [address] Phone: [phone]   Fax:  [phone]  Name: Maureen Lewis MRN: [id number] Date of Birth: 1995-12-18

## 2021-04-23 ENCOUNTER — Ambulatory Visit: Payer: Medicaid Other | Attending: Adult Health

## 2021-04-23 ENCOUNTER — Telehealth: Payer: Self-pay

## 2021-04-23 DIAGNOSIS — Z79899 Other long term (current) drug therapy: Secondary | ICD-10-CM | POA: Diagnosis not present

## 2021-04-23 DIAGNOSIS — M419 Scoliosis, unspecified: Secondary | ICD-10-CM | POA: Diagnosis not present

## 2021-04-23 DIAGNOSIS — Z6841 Body Mass Index (BMI) 40.0 and over, adult: Secondary | ICD-10-CM | POA: Diagnosis not present

## 2021-04-23 DIAGNOSIS — M25671 Stiffness of right ankle, not elsewhere classified: Secondary | ICD-10-CM | POA: Insufficient documentation

## 2021-04-23 DIAGNOSIS — R262 Difficulty in walking, not elsewhere classified: Secondary | ICD-10-CM | POA: Insufficient documentation

## 2021-04-23 DIAGNOSIS — R293 Abnormal posture: Secondary | ICD-10-CM | POA: Insufficient documentation

## 2021-04-23 DIAGNOSIS — M542 Cervicalgia: Secondary | ICD-10-CM | POA: Insufficient documentation

## 2021-04-23 DIAGNOSIS — M25572 Pain in left ankle and joints of left foot: Secondary | ICD-10-CM | POA: Insufficient documentation

## 2021-04-23 DIAGNOSIS — M545 Low back pain, unspecified: Secondary | ICD-10-CM | POA: Insufficient documentation

## 2021-04-23 DIAGNOSIS — M25571 Pain in right ankle and joints of right foot: Secondary | ICD-10-CM | POA: Insufficient documentation

## 2021-04-23 DIAGNOSIS — N915 Oligomenorrhea, unspecified: Secondary | ICD-10-CM | POA: Diagnosis not present

## 2021-04-23 DIAGNOSIS — G8929 Other chronic pain: Secondary | ICD-10-CM | POA: Insufficient documentation

## 2021-04-23 DIAGNOSIS — S82891A Other fracture of right lower leg, initial encounter for closed fracture: Secondary | ICD-10-CM | POA: Insufficient documentation

## 2021-04-23 DIAGNOSIS — R2681 Unsteadiness on feet: Secondary | ICD-10-CM | POA: Insufficient documentation

## 2021-04-23 NOTE — Telephone Encounter (Signed)
Attempted to contact patient regarding missed PT appointment. Unable to leave voicemail.

## 2021-04-25 ENCOUNTER — Ambulatory Visit: Payer: Medicaid Other

## 2021-04-25 ENCOUNTER — Other Ambulatory Visit: Payer: Self-pay

## 2021-04-25 DIAGNOSIS — G8929 Other chronic pain: Secondary | ICD-10-CM

## 2021-04-25 DIAGNOSIS — M542 Cervicalgia: Secondary | ICD-10-CM

## 2021-04-25 DIAGNOSIS — R2681 Unsteadiness on feet: Secondary | ICD-10-CM | POA: Diagnosis not present

## 2021-04-25 DIAGNOSIS — M25571 Pain in right ankle and joints of right foot: Secondary | ICD-10-CM | POA: Diagnosis not present

## 2021-04-25 DIAGNOSIS — M25572 Pain in left ankle and joints of left foot: Secondary | ICD-10-CM | POA: Diagnosis not present

## 2021-04-25 DIAGNOSIS — M545 Low back pain, unspecified: Secondary | ICD-10-CM | POA: Diagnosis not present

## 2021-04-25 DIAGNOSIS — S82891A Other fracture of right lower leg, initial encounter for closed fracture: Secondary | ICD-10-CM | POA: Diagnosis not present

## 2021-04-25 DIAGNOSIS — R262 Difficulty in walking, not elsewhere classified: Secondary | ICD-10-CM

## 2021-04-25 DIAGNOSIS — M25671 Stiffness of right ankle, not elsewhere classified: Secondary | ICD-10-CM | POA: Diagnosis not present

## 2021-04-25 DIAGNOSIS — R293 Abnormal posture: Secondary | ICD-10-CM | POA: Diagnosis not present

## 2021-04-25 NOTE — Therapy (Signed)
[place], [place], [address] Phone: [phone]   Fax:  [phone]  Physical Therapy Treatment  Patient Details  Name: Maureen Lewis MRN: [id number] Date of Birth: 1996/05/12 Referring Provider (PT): Gregor Hams,   Encounter Date: 04/25/2021   PT End of Session - 04/25/21 1657     Visit Number 8    Number of Visits 17    Date for PT Re-Evaluation 05/11/21    Authorization Type Medicaid [employer]    Authorization Time Period 6/1-7/30    Authorization - Visit Number 7    Authorization - Number of Visits 16    PT Start Time 1884    PT Stop Time 1740    PT Time Calculation (min) 42 min    Activity Tolerance Patient tolerated treatment well    Behavior During Therapy [facility] for tasks assessed/performed             Past Medical History:  Diagnosis Date   Abnormal uterine bleeding    Acid reflux    Alpha thalassemia trait    Amenorrhea    Anemia    no current med.   Back pain    Cesarean delivery delivered 10/11/2019   10/09/2019 - primary CS for failed IOL   Chest pain    resolved   Complication of anesthesia    states had to keep giving her anesthesia during EGD   Constipation    Depression    "I'm good"   Fibromyalgia    Finger mass, left 03/2017   middle finger   Gestational diabetes 05/23/2019   hx - with pregnancy only    Gestational hypertension 09/23/2019   Guidelines for Antenatal Testing and Sonography  (with updated ICD-10 codes)  Updated  02-Oct-2019 with Dr. Tama High  INDICATION U/S 2 X week NST/AFI  or full BPP wkly DELIVERY Diabetes   A1 - good control - O24.410    A2 - good control - O24.419      A2  - poor control or poor compliance - O24.419, E11.65   (Macrosomia or polyhydramnios) **E11.65 is extra code for poor control**    A2/B - O24.   Headache    Hypertension    Infection    UTI   Irritable bowel syndrome (IBS)    Joint pain    Morbid obesity with  body mass index (BMI) of 45.0 to 49.9 in adult [facility])    Obesity during pregnancy, antepartum 04/06/2019   Body mass index is 53.71 kg/m.  Recommendations $RemoveBeforeDE'[x]'iOOwOxjEwIffxBX$  Aspirin 81 mg daily after 12 weeks; discontinue after 36 weeks $Remove'[ ]'ygVUMxf$  Nutrition consult $RemoveBeforeDEI'[ ]'qEHdgGvxMaBMrJkA$  Weight gain 11-20 lbs for singleton and 25-35 lbs for twin pregnancy (IOM guidelines) Higher class of obesity patients recommended to gain closer to lower limit  Weight loss is associated with adverse outcomes $RemoveBeforeDE'[ ]'AkzxFUgJdxcimyM$  Baseline and surveillance labs (pulled in fr   Plantar fasciitis, bilateral    resolved   Polycystic ovary syndrome    Pre-diabetes    no meds, diet controlled, diet not check blood sugar   Prediabetes    "prediabetes"   Pregnancy induced hypertension    Shortness of breath    albuterol inhaler   Sleep apnea    no CPAP use   Supervision of normal first pregnancy 04/06/2019   [name]: [ x]Initial [ x]12 $Remo'[ ]'NKYpy$ 20 $Remo'[ ]'aWfNc$ 28 $Remo'[ ]'BRdWP$ 32 $Remo'[ ]'pIcSb$ 36 $Remo'[ ]'glWWV$ 38 $Remo'[ ]'YOfth$ 39 $Remo'[ ]'azDkP$ 40 Nursing Staff Provider Office Location [facility]  Dating   LMP Language  English  Anatomy US  Nml Flu Vaccine  Declined 07/28/19 Genetic Screen  NIPS: low risks   AFP:   negative  TDaP vaccine   08/25/19 Hgb A1C or  GTT Early A1C 5.8 Third trimester: GDM insulin Rhogam  NA   LAB RESULTS  Feeding Plan Breast Blood Type   Vertigo 2017    Past Surgical History:  Procedure Laterality Date   CESAREAN SECTION N/A 10/09/2019   Procedure: CESAREAN SECTION;  Surgeon: [place] Bing, MD;  [facility];  Service: Obstetrics;  Laterality: N/A;   COLONOSCOPY WITH PROPOFOL  10/07/2016   DILATION AND CURETTAGE OF UTERUS     ESOPHAGOGASTRODUODENOSCOPY  12/21/2015   EXCISION MASS UPPER EXTREMETIES Left 04/21/2017   Procedure: EXCISION MASS LEFT MIDDLE FINGER;  Surgeon: Cindee Salt, MD;  Location: [facility];  Service: Orthopedics;  Laterality: Left;   IR FL GUIDED LOC OF NEEDLE/CATH TIP FOR SPINAL INJECTION RT  03/13/2020   PHOTOCOAGULATION WITH LASER Left 12/13/2020   Procedure:  RETINOPLEXY LEFT EYE;  Surgeon: Stephannie Li, MD;  Location: [facility] OR;  Service: Ophthalmology;  Laterality: Left;   SCLERAL BUCKLE Right 12/13/2020   Procedure: SCLERAL BUCKLE WITH CRYO;  Surgeon: Stephannie Li, MD;  Location: [facility] OR;  Service: Ophthalmology;  Laterality: Right;    There were no vitals filed for this visit.   Subjective Assessment - 04/25/21 1659     Subjective Patient reports she is ok. She reports compliance with HEP.    Currently in Pain? Yes    Pain Score 5     Pain Location Ankle    Pain Orientation Lateral;Right    Pain Descriptors / Indicators Other (Comment)   tugging and pulling   Pain Type Chronic pain    Pain Onset More than a month ago    Pain Frequency Intermittent    Aggravating Factors  walking, standing    Pain Relieving Factors rest    Multiple Pain Sites Yes    Pain Score 4    Pain Location Back    Pain Orientation Lower    Pain Descriptors / Indicators Stabbing    Pain Type Chronic pain    Pain Onset More than a month ago    Pain Frequency Constant    Aggravating Factors  standing, walking    Pain Relieving Factors heat                [facility] PT Assessment - 04/25/21 0001       Balance   Balance Assessed Yes      Static Standing Balance   Static Standing - Comment/# of Minutes 30 second SLS bilateral                           [facility] Adult PT Treatment/Exercise - 04/25/21 0001       Self-Care   Other Self-Care Comments  see patient education      Lumbar Exercises: Standing   Other Standing Lumbar Exercises hip hinge with dowel partial range 2 x 10      Knee/Hip Exercises: Standing   Other Standing Knee Exercises 3 way hip 2 x 10    Other Standing Knee Exercises sumo squat 1  x10 @ 10 lb, 1 x 10 @ 15 lbs      Ankle Exercises: Stretches   Gastroc Stretch 60 seconds    Other Stretch great toe flexor stretch 30 sec  PT Education - 04/25/21 1741     Education Details Aquatic  therapy information.    Person(s) Educated Patient    Methods Explanation;Handout    Comprehension Verbalized understanding              PT Short Term Goals - 04/17/21 1814       PT SHORT TERM GOAL #1   Title Patient will be independent with initial HEP.    Time 3    Period Weeks    Status Achieved    Target Date 04/02/21      PT SHORT TERM GOAL #2   Title Patient will demonstrate at least 10 degrees of Rt ankle dorsiflexion AROM to improve gait mechanics.    Time 4    Period Weeks    Status Achieved    Target Date 04/09/21      PT SHORT TERM GOAL #3   Title Patient will demonstrate 5/5 Rt ankle inversion strength to improve stability with walking.    Time 4    Period Weeks    Status Achieved    Target Date 04/09/21      PT SHORT TERM GOAL #4   Title Patient will complete overhead lifting of light weight (<5 lbs) with proper muscle sequencing in order to avoid overuse of upper traps with UE activity.    Baseline significant upper trap compensation with UE MMT.    Time 4    Period Weeks    Status Deferred    Target Date 04/09/21      PT SHORT TERM GOAL #5   Title Patient will squat 20 lbs without increased pain to improve ability to pick up her daughter in standing.    Baseline goblet squat with 10 lbs 04/17/21    Time 4    Period Weeks    Status On-going    Target Date 04/09/21               PT Long Term Goals - 04/25/21 1715       PT LONG TERM GOAL #1   Title Patient will be independent with advanced HEP to manage her chronic conditions.    Baseline progressing as appropriate    Time 8    Period Weeks    Status On-going      PT LONG TERM GOAL #2   Title Patient will complete 5 x STS in </=13 seconds for improve functional strength and to signify decreased fall risk.    Baseline 17.3 seconds    Time 8    Period Weeks    Status Deferred      PT LONG TERM GOAL #3   Title Patient will maintain Rt SLS for at least 20 seconds to improve stability  when walking on uneven surfaces.    Baseline 30 seconds    Time 8    Period Weeks    Status Achieved      PT LONG TERM GOAL #4   Title Patient will report ability to tolerate walking/standing for at least 1 hour with pain less than 2/10 to improve tolerance to work activity.    Baseline current pain 5/10    Time 8    Period Weeks    Status On-going                   Plan - 04/25/21 1702     Clinical Impression Statement Patient arrives with moderate Rt ankle and low back pain. Her single leg balance has  much improved on the RLE with ability to [facility] for 30 seconds without increased pain, having met this LTG. She is challenged with 3 way hip having difficulty maintaining neutral trunk alignment and requires consistent cues to decrease knee hyperextension. Able to progress squatting activity with patient able to maintain proper form during sumo squat with 15 lbs. She is able to complete partial range of standing hip hinge with good form,though when she attempts further range she is unable to control excessive trunk flexion. She reported a reduction in her ankle and back pain at conclusion of session rated as 3/10.    Personal Factors and Comorbidities Age;Comorbidity 3+;Fitness;Profession;Time since onset of injury/illness/exacerbation    Comorbidities alpha thalassemia trait, anemia, back pain, depression, gestational diabetes, obesity, bilat plantar fasciitis, OSA, vertigo    Examination-Activity Limitations Stand;Locomotion Level;Lift    Examination-Participation Restrictions Occupation    Stability/Clinical Decision Making Evolving/Moderate complexity    Rehab Potential Fair    PT Frequency 2x / week    PT Duration 8 weeks    PT Treatment/Interventions Moist Heat;Gait training;Stair training;Functional mobility training;Therapeutic activities;Therapeutic exercise;Balance training;Manual techniques;Patient/family education;Passive range of motion;Dry needling;Taping;ADLs/Self  Care Home Management;Aquatic Therapy;Cryotherapy;Neuromuscular re-education    PT Next Visit Plan gait training, balance, general UE/LE strengthening    PT Home Exercise Plan Access Code: [email or web address]    Consulted and Agree with Plan of Care Patient             Patient will benefit from skilled therapeutic intervention in order to improve the following deficits and impairments:  Difficulty walking, Abnormal gait, Decreased range of motion, Obesity, Pain, Postural dysfunction  Visit Diagnosis: Chronic bilateral low back pain, unspecified whether sciatica present  Cervicalgia  Chronic pain of both ankles  Difficulty in walking, not elsewhere classified  Abnormal posture     Problem List Patient Active Problem List   Diagnosis Date Noted   Encounter for insertion of mirena IUD 04/01/2021   Levator spasm 11/21/2020   Galactorrhea 11/19/2020   Hx of migraine headaches 04/09/2020   Contraceptive management 11/22/2019   Positive RPR test 10/09/2019   HTN (hypertension) 09/23/2019   Migraine headache 07/12/2019   Alpha thalassemia trait    Vitamin D deficiency 09/16/2017   Prediabetes 09/16/2017   Depression 09/16/2017   Class 3 severe obesity with serious comorbidity and body mass index (BMI) of 50.0 to 59.9 in adult ([place]) 09/16/2017   PCOS (polycystic ovarian syndrome) 08/13/2017   Mass 05/06/2017   Vertigo 10/06/2016   Microcytic anemia 09/26/2016   Pseudotumor cerebri syndrome 08/28/2016   Obesity hypoventilation syndrome ([place]) 08/28/2016   Benign paroxysmal positional vertigo due to bilateral vestibular disorder 08/28/2016   OSA (obstructive sleep apnea) 08/28/2016   Hypersomnia with sleep apnea 08/28/2016   Female hirsutism 08/28/2016   GERD (gastroesophageal reflux disease) 02/24/2015   Constipation 10/02/2014   Gwendolyn Grant, PT, DPT, ATC 04/25/21 5:44 PM   [facility] [address] [place], [place],  [address] Phone: [phone]   Fax:  [phone]  Name: Maureen Lewis MRN: [id number] Date of Birth: [birth year]

## 2021-04-25 NOTE — Patient Instructions (Signed)
Aquatic Therapy at [facility]-  What to Expect!  Where:   [facility] [place], [place] [address] Rehab phone [phone]  NOTE:  You will receive an automated phone message reminding you of your appt and it will say the appointment is at the [facility] clinic.          How to Prepare: Please make sure you drink 8 ounces of water about one hour prior to your pool session A caregiver may attend if needed with the patient to help assist as needed. A caregiver can sit in the pool room on chair. Please arrive IN YOUR SUIT and 15 minutes prior to your appointment - this helps to avoid delays in starting your session. Please make sure to attend to any toileting needs prior to entering the pool [place] rooms for changing are provided.   There is direct access to the pool deck form the locker room.  You can lock your belongings in a locker with lock provided. Once on the pool deck your therapist will ask if you have signed the Patient  Consent and Assignment of Benefits form before beginning treatment Your therapist may take your blood pressure prior to, during and after your session if indicated We usually try and create a home exercise program based on activities we do in the pool.  Please be thinking about who might be able to assist you in the pool should you need to participate in an aquatic home exercise program at the time of discharge if you need assistance.  Some patients do not want to or do not have the ability to participate in an aquatic home program - this is not a barrier in any way to you participating in aquatic therapy as part of your current therapy plan! After Discharge from PT, you can continue using home program at  the [facility], there is a drop-in fee for $5 ($45 a month)or for 60 years  or older $4.00 ($40 a month for seniors ) or any local [employer] pool.  Memberships for purchase are  available for gym/pool at [facility] LAST AQUATIC VISIT.  PLEASE MAKE SURE THAT YOU HAVE A [address]  APPOINTMENT SCHEDULED.   About the pool: Pool is located approximately 500 FT from the entrance of the building.  Please bring a support person if you need assistance traveling this      distance.   Your therapist will assist you in entering the water; there are multiple ways to           enter including stairs with railings, a walk in ramp, a roll in chair and a                      mechanical lift. Your therapist will determine the most appropriate way for you.  Water temperature is usually between 88-90 degrees  There may be up to 2 other swimmers in the pool at the same time  The pool deck is tile, please wear shoes with good traction if you prefer not to be barefoot.    Contact Info:  For appointment scheduling and cancellations:         Please call the [facility]  [phone]              Aquatic Therapy  Outpatient  Rehabilitation @ [facility]       All sessions are 45 minutes

## 2021-04-26 DIAGNOSIS — Z79899 Other long term (current) drug therapy: Secondary | ICD-10-CM | POA: Diagnosis not present

## 2021-04-30 DIAGNOSIS — H5213 Myopia, bilateral: Secondary | ICD-10-CM | POA: Diagnosis not present

## 2021-05-01 ENCOUNTER — Encounter: Payer: Self-pay | Admitting: Physical Therapy

## 2021-05-01 ENCOUNTER — Ambulatory Visit: Payer: Medicaid Other | Admitting: Physical Therapy

## 2021-05-01 ENCOUNTER — Other Ambulatory Visit: Payer: Self-pay

## 2021-05-01 DIAGNOSIS — S82891A Other fracture of right lower leg, initial encounter for closed fracture: Secondary | ICD-10-CM

## 2021-05-01 DIAGNOSIS — M25572 Pain in left ankle and joints of left foot: Secondary | ICD-10-CM | POA: Diagnosis not present

## 2021-05-01 DIAGNOSIS — R262 Difficulty in walking, not elsewhere classified: Secondary | ICD-10-CM

## 2021-05-01 DIAGNOSIS — G8929 Other chronic pain: Secondary | ICD-10-CM

## 2021-05-01 DIAGNOSIS — R293 Abnormal posture: Secondary | ICD-10-CM

## 2021-05-01 DIAGNOSIS — R2681 Unsteadiness on feet: Secondary | ICD-10-CM

## 2021-05-01 DIAGNOSIS — M25671 Stiffness of right ankle, not elsewhere classified: Secondary | ICD-10-CM

## 2021-05-01 DIAGNOSIS — M542 Cervicalgia: Secondary | ICD-10-CM | POA: Diagnosis not present

## 2021-05-01 DIAGNOSIS — M545 Low back pain, unspecified: Secondary | ICD-10-CM | POA: Diagnosis not present

## 2021-05-01 DIAGNOSIS — M25571 Pain in right ankle and joints of right foot: Secondary | ICD-10-CM | POA: Diagnosis not present

## 2021-05-01 NOTE — Therapy (Signed)
[place], [place], [address] Phone: [phone]   Fax:  [phone]  Physical Therapy Treatment  Patient Details  Name: Maureen Lewis MRN: [id number] Date of Birth: 1996/01/23 Referring Provider (PT): Gregor Hams,   Encounter Date: 05/01/2021   PT End of Session - 05/01/21 1702     Visit Number 9    Number of Visits 17    Date for PT Re-Evaluation 05/11/21    Authorization Type Medicaid [employer]    Authorization Time Period 6/1-7/30    PT Start Time 1545    PT Stop Time 1632    PT Time Calculation (min) 47 min    Activity Tolerance Patient tolerated treatment well    Behavior During Therapy [facility] for tasks assessed/performed             Past Medical History:  Diagnosis Date   Abnormal uterine bleeding    Acid reflux    Alpha thalassemia trait    Amenorrhea    Anemia    no current med.   Back pain    Cesarean delivery delivered 10/11/2019   10/09/2019 - primary CS for failed IOL   Chest pain    resolved   Complication of anesthesia    states had to keep giving her anesthesia during EGD   Constipation    Depression    "I'm good"   Fibromyalgia    Finger mass, left 03/2017   middle finger   Gestational diabetes 05/23/2019   hx - with pregnancy only    Gestational hypertension 09/23/2019   Guidelines for Antenatal Testing and Sonography  (with updated ICD-10 codes)  Updated  09-24-2019 with Dr. Tama High  INDICATION U/S 2 X week NST/AFI  or full BPP wkly DELIVERY Diabetes   A1 - good control - O24.410    A2 - good control - O24.419      A2  - poor control or poor compliance - O24.419, E11.65   (Macrosomia or polyhydramnios) **E11.65 is extra code for poor control**    A2/B - O24.   Headache    Hypertension    Infection    UTI   Irritable bowel syndrome (IBS)    Joint pain    Morbid obesity with body mass index (BMI) of 45.0 to 49.9 in adult [facility])    Obesity during  pregnancy, antepartum 04/06/2019   Body mass index is 53.71 kg/m.  Recommendations [x]  Aspirin 81 mg daily after 12 weeks; discontinue after 36 weeks [ ]  Nutrition consult [ ]  Weight gain 11-20 lbs for singleton and 25-35 lbs for twin pregnancy (IOM guidelines) Higher class of obesity patients recommended to gain closer to lower limit  Weight loss is associated with adverse outcomes [ ]  Baseline and surveillance labs (pulled in fr   Plantar fasciitis, bilateral    resolved   Polycystic ovary syndrome    Pre-diabetes    no meds, diet controlled, diet not check blood sugar   Prediabetes    "prediabetes"   Pregnancy induced hypertension    Shortness of breath    albuterol inhaler   Sleep apnea    no CPAP use   Supervision of normal first pregnancy 04/06/2019   [name]: [ x]Initial [ x]12 [ ] 20 [ ] 28 [ ] 32 [ ] 36 [ ] 38 [ ] 39 [ ] 40 Nursing Staff Provider Office Location [facility]  Dating   LMP Language  English  Anatomy US  Nml Flu Vaccine  Declined  07/28/19 Genetic Screen  NIPS: low risks   AFP:   negative  TDaP vaccine   08/25/19 Hgb A1C or  GTT Early A1C 5.8 Third trimester: GDM insulin Rhogam  NA   LAB RESULTS  Feeding Plan Breast Blood Type   Vertigo 2017    Past Surgical History:  Procedure Laterality Date   CESAREAN SECTION N/A 10/09/2019   Procedure: CESAREAN SECTION;  Surgeon: Aletha Halim, MD;  Location: [facility] LD [facility]S;  Service: Obstetrics;  Laterality: N/A;   COLONOSCOPY WITH PROPOFOL  10/07/2016   DILATION AND CURETTAGE OF UTERUS     ESOPHAGOGASTRODUODENOSCOPY  12/21/2015   EXCISION MASS UPPER EXTREMETIES Left 04/21/2017   Procedure: EXCISION MASS LEFT MIDDLE FINGER;  Surgeon: Daryll Brod, MD;  Location: [place];  Service: Orthopedics;  Laterality: Left;   IR FL GUIDED LOC OF NEEDLE/CATH TIP FOR SPINAL INJECTION RT  03/13/2020   PHOTOCOAGULATION WITH LASER Left 12/13/2020   Procedure: RETINOPLEXY LEFT EYE;  Surgeon: Sherlynn Stalls, MD;  Location: [place];   Service: Ophthalmology;  Laterality: Left;   SCLERAL BUCKLE Right 12/13/2020   Procedure: SCLERAL BUCKLE WITH CRYO;  Surgeon: Sherlynn Stalls, MD;  Location: [place];  Service: Ophthalmology;  Laterality: Right;    There were no vitals filed for this visit.   Subjective Assessment - 05/01/21 1659     Subjective Pt reports she has 6/10 pain in R ankle.  she has back pain but she feels most limited by ankle.    Pertinent History acid reflux, alpha thalassemia trait, anemia, back pain, depression, gestational diabetes, HA, HTN, UTI, IBS, obesity, bilat plantar fasciitis, OSA, vertigo    Limitations Standing;Walking;House hold activities;Lifting    Patient Stated Goals not eliminate the pain, but get the pain to decrease and be able to do my daily living, walking/standing and pick my daughter up when standing and play with the kids to not get tired very quickly.    Pain Score 6     Pain Location Ankle    Pain Orientation Right;Lateral    Pain Descriptors / Indicators Aching    Pain Type Chronic pain    Pain Onset More than a month ago    Pain Frequency Intermittent    Pain Score 6    Pain Location Back    Pain Orientation Lower    Pain Descriptors / Indicators Aching;Dull    Pain Type Chronic pain    Pain Onset More than a month ago    Pain Frequency Constant               Aquatic therapy at [address] - therapeutic pool temp 88-90  degrees Pt enters building without AD.  Treatment took place in water 3.8 to  4 ft 8 in.feet deep depending upon activity.  Pt entered and exited the pool via stair and handrails independently.     Ms Marker  entered water for aquatic therapy for first time and was introduced to principles and therapeutic effects of water as she ambulated and acclimated to pool. Marland Kitchen    She was able to ambulate across pool 15 ft x 6 then side steps x 4 and then ambulates backward x 4 with emphasis on toe heel.    She was able to use aquatic barbells while  ambulating to increase abdominal engagement with exercises.   Runners stretch  2 x  30 sec Left and then moves into hamstring stretch  Then runners stretch on R 2 x 30  sec and then move into hamstring stretch . TC to insure proper technique. Figure 4 squat stretch with UE support for R and L x 60 sec each  Gasroc stretch 30 sec x 2 R and then L with toe on pool wall       On edge of pool with bil UE support   Pt performed LE exercise  Hip abd/add R/L 20 x each and then using 1 UE support Hip ext/flex with knee straight x 20, pt needing VC and TC for correct execution and sequencing  Marching knee/hip 90/90 x 10 and then with added pool noodle for increased resistance x 10 each leg  ham curl R/L x 20.  Squats x 20 reps with intermittent UE support x 2 sets. Lunge to target x 10 on R and the x 10 on LE Treading water in deep 4.8 feet for 2 minutes Alternating jumps in water.   Aquastretch to R ankle gastroc, peroneals and tibialis anteror using foot hold and ankle hold       Pt requires the buoyancy of water for active assisted exercises with buoyancy supported for strengthening and AROM exercises: PT  requires the viscosity of the water for resistance with strengthening exercises Hydrostatic pressure also supports joints by unweighting joint load by at least 50 % in 3-4 feet depth water. 80% in chest to neck deep water. Water will allow for  reduced joint loading through buoyancy to help patient improve posture without excess stress and pain.                        PT Education - 05/01/21 1701     Education Details Education on Aquatics, iniitiation of HEP for [employer] on therapeutic effects of water    Person(s) Educated Patient    Methods Explanation;Demonstration;Tactile cues;Verbal cues    Comprehension Verbalized understanding;Returned demonstration              PT Short Term Goals - 04/17/21 1814       PT SHORT TERM GOAL #1   Title  Patient will be independent with initial HEP.    Time 3    Period Weeks    Status Achieved    Target Date 04/02/21      PT SHORT TERM GOAL #2   Title Patient will demonstrate at least 10 degrees of Rt ankle dorsiflexion AROM to improve gait mechanics.    Time 4    Period Weeks    Status Achieved    Target Date 04/09/21      PT SHORT TERM GOAL #3   Title Patient will demonstrate 5/5 Rt ankle inversion strength to improve stability with walking.    Time 4    Period Weeks    Status Achieved    Target Date 04/09/21      PT SHORT TERM GOAL #4   Title Patient will complete overhead lifting of light weight (<5 lbs) with proper muscle sequencing in order to avoid overuse of upper traps with UE activity.    Baseline significant upper trap compensation with UE MMT.    Time 4    Period Weeks    Status Deferred    Target Date 04/09/21      PT SHORT TERM GOAL #5   Title Patient will squat 20 lbs without increased pain to improve ability to pick up her daughter in standing.    Baseline goblet squat with 10 lbs 04/17/21    Time  4    Period Weeks    Status On-going    Target Date 04/09/21               PT Long Term Goals - 04/25/21 1715       PT LONG TERM GOAL #1   Title Patient will be independent with advanced HEP to manage her chronic conditions.    Baseline progressing as appropriate    Time 8    Period Weeks    Status On-going      PT LONG TERM GOAL #2   Title Patient will complete 5 x STS in </=13 seconds for improve functional strength and to signify decreased fall risk.    Baseline 17.3 seconds    Time 8    Period Weeks    Status Deferred      PT LONG TERM GOAL #3   Title Patient will maintain Rt SLS for at least 20 seconds to improve stability when walking on uneven surfaces.    Baseline 30 seconds    Time 8    Period Weeks    Status Achieved      PT LONG TERM GOAL #4   Title Patient will report ability to tolerate walking/standing for at least 1 hour with  pain less than 2/10 to improve tolerance to work activity.    Baseline current pain 5/10    Time 8    Period Weeks    Status On-going                   Plan - 05/01/21 1702     Clinical Impression Statement Ms Mccraw enters pool area for first aquatic RX with 6/10 R ankle pain.   Pt reduced to 2/10 pain at end of session.  Pt able to move and increase AROM of ankle with exercises and squatting in water without increased pain.  Pt requires the buoyancy of water for active assisted exercises with buoyancy supported for strengthening and AROM exercises: PT  requires the viscosity of the water for resistance with strengthening exercises.Water will allow for  reduced joint loading through buoyancy to help patient improve posture without excess stress and pain.    Personal Factors and Comorbidities Age;Comorbidity 3+;Fitness;Profession;Time since onset of injury/illness/exacerbation    Comorbidities alpha thalassemia trait, anemia, back pain, depression, gestational diabetes, obesity, bilat plantar fasciitis, OSA, vertigo    Examination-Activity Limitations Stand;Locomotion Level;Lift    Examination-Participation Restrictions Occupation    PT Frequency 2x / week    PT Duration 8 weeks    PT Treatment/Interventions Moist Heat;Gait training;Stair training;Functional mobility training;Therapeutic activities;Therapeutic exercise;Balance training;Manual techniques;Patient/family education;Passive range of motion;Dry needling;Taping;ADLs/Self Care Home Management;Aquatic Therapy;Cryotherapy;Neuromuscular re-education    PT Next Visit Plan gait training, balance, general UE/LE strengthening    PT Home Exercise Plan Access Code: [email or web address]    Consulted and Agree with Plan of Care Patient             Patient will benefit from skilled therapeutic intervention in order to improve the following deficits and impairments:  Difficulty walking, Abnormal gait, Decreased range of motion, Obesity, Pain,  Postural dysfunction  Visit Diagnosis: Chronic bilateral low back pain, unspecified whether sciatica present  Cervicalgia  Chronic pain of both ankles  Difficulty in walking, not elsewhere classified  Abnormal posture  Closed fracture of right ankle, initial encounter  Decreased range of motion of right ankle  Pain in right ankle and joints of right foot  Unsteadiness on feet     Problem  List Patient Active Problem List   Diagnosis Date Noted   Encounter for insertion of mirena IUD 04/01/2021   Levator spasm 11/21/2020   Galactorrhea 11/19/2020   Hx of migraine headaches 04/09/2020   Contraceptive management 11/22/2019   Positive RPR test 10/09/2019   HTN (hypertension) 09/23/2019   Migraine headache 07/12/2019   Alpha thalassemia trait    Vitamin D deficiency 09/16/2017   Prediabetes 09/16/2017   Depression 09/16/2017   Class 3 severe obesity with serious comorbidity and body mass index (BMI) of 50.0 to 59.9 in adult [facility]) 09/16/2017   PCOS (polycystic ovarian syndrome) 08/13/2017   Mass 05/06/2017   Vertigo 10/06/2016   Microcytic anemia 09/26/2016   Pseudotumor cerebri syndrome 08/28/2016   Obesity hypoventilation syndrome ([place]) 08/28/2016   Benign paroxysmal positional vertigo due to bilateral vestibular disorder 08/28/2016   OSA (obstructive sleep apnea) 08/28/2016   Hypersomnia with sleep apnea 08/28/2016   Female hirsutism 08/28/2016   GERD (gastroesophageal reflux disease) 02/24/2015   Constipation 10/02/2014   Voncille Lo, PT, [place] Certified Exercise Expert for the Aging Adult  05/01/21 8:59 PM Phone: [phone] Fax: [facility] [address] [place], [place], [address] Phone: [phone]   Fax:  [phone]  Name: Maureen Lewis MRN: [id number] Date of Birth: 10/05/1996

## 2021-05-02 ENCOUNTER — Ambulatory Visit: Payer: Medicaid Other

## 2021-05-02 DIAGNOSIS — M545 Low back pain, unspecified: Secondary | ICD-10-CM | POA: Diagnosis not present

## 2021-05-02 DIAGNOSIS — R293 Abnormal posture: Secondary | ICD-10-CM

## 2021-05-02 DIAGNOSIS — G8929 Other chronic pain: Secondary | ICD-10-CM

## 2021-05-02 DIAGNOSIS — R262 Difficulty in walking, not elsewhere classified: Secondary | ICD-10-CM

## 2021-05-02 DIAGNOSIS — M25571 Pain in right ankle and joints of right foot: Secondary | ICD-10-CM

## 2021-05-02 DIAGNOSIS — M542 Cervicalgia: Secondary | ICD-10-CM | POA: Diagnosis not present

## 2021-05-02 DIAGNOSIS — S82891A Other fracture of right lower leg, initial encounter for closed fracture: Secondary | ICD-10-CM | POA: Diagnosis not present

## 2021-05-02 DIAGNOSIS — M25572 Pain in left ankle and joints of left foot: Secondary | ICD-10-CM | POA: Diagnosis not present

## 2021-05-02 DIAGNOSIS — M25671 Stiffness of right ankle, not elsewhere classified: Secondary | ICD-10-CM | POA: Diagnosis not present

## 2021-05-02 DIAGNOSIS — R2681 Unsteadiness on feet: Secondary | ICD-10-CM | POA: Diagnosis not present

## 2021-05-02 NOTE — Therapy (Addendum)
[place], [place], [address] Phone: [phone]   Fax:  [phone]  Physical Therapy Treatment/Discharge  Patient Details  Name: Maureen Lewis MRN: [id number] Date of Birth: [birth year] Referring Provider (PT): Gregor Hams,   Encounter Date: 05/02/2021   PT End of Session - 05/02/21 1746     Visit Number 10    Number of Visits 17    Date for PT Re-Evaluation 05/11/21    Authorization Type Medicaid [employer]    Authorization Time Period 6/1-7/30    Authorization - Visit Number 9    Authorization - Number of Visits 16    PT Start Time 1701    PT Stop Time 8341    PT Time Calculation (min) 42 min    Activity Tolerance Patient tolerated treatment well    Behavior During Therapy WFL for tasks assessed/performed             Past Medical History:  Diagnosis Date   Abnormal uterine bleeding    Acid reflux    Alpha thalassemia trait    Amenorrhea    Anemia    no current med.   Back pain    Cesarean delivery delivered 10/11/2019   10/09/2019 - primary CS for failed IOL   Chest pain    resolved   Complication of anesthesia    states had to keep giving her anesthesia during EGD   Constipation    Depression    "I'm good"   Fibromyalgia    Finger mass, left 03/2017   middle finger   Gestational diabetes 05/23/2019   hx - with pregnancy only    Gestational hypertension 09/23/2019   Guidelines for Antenatal Testing and Sonography  (with updated ICD-10 codes)  Updated  09-17-2019 with Dr. Tama High  INDICATION U/S 2 X week NST/AFI  or full BPP wkly DELIVERY Diabetes   A1 - good control - O24.410    A2 - good control - O24.419      A2  - poor control or poor compliance - O24.419, E11.65   (Macrosomia or polyhydramnios) **E11.65 is extra code for poor control**    A2/B - O24.   Headache    Hypertension    Infection    UTI   Irritable bowel syndrome (IBS)    Joint pain    Morbid  obesity with body mass index (BMI) of 45.0 to 49.9 in adult [facility])    Obesity during pregnancy, antepartum 04/06/2019   Body mass index is 53.71 kg/m.  Recommendations _0  Aspirin 81 mg daily after 12 weeks; discontinue after 36 weeks _1  Nutrition consult _2  Weight gain 11-20 lbs for singleton and 25-35 lbs for twin pregnancy (IOM guidelines) Higher class of obesity patients recommended to gain closer to lower limit  Weight loss is associated with adverse outcomes _3  Baseline and surveillance labs (pulled in fr   Plantar fasciitis, bilateral    resolved   Polycystic ovary syndrome    Pre-diabetes    no meds, diet controlled, diet not check blood sugar   Prediabetes    "prediabetes"   Pregnancy induced hypertension    Shortness of breath    albuterol inhaler   Sleep apnea    no CPAP use   Supervision of normal first pregnancy 04/06/2019   [name]: [ x]Initial [ x]12 _4 20 _5 28 _6 32 _7 36 _8 38 _9 39 _10 40 Nursing Staff Provider Office Location [facility]  Dating   LMP Language  English  Anatomy US  Nml Flu Vaccine  Declined 07/28/19 Genetic Screen  NIPS: low risks   AFP:   negative  TDaP vaccine   08/25/19 Hgb A1C or  GTT Early A1C 5.8 Third trimester: GDM insulin Rhogam  NA   LAB RESULTS  Feeding Plan Breast Blood Type   Vertigo 2017    Past Surgical History:  Procedure Laterality Date   CESAREAN SECTION N/A 10/09/2019   Procedure: CESAREAN SECTION;  Surgeon: Aletha Halim, MD;  Location: [place] LD ORS;  Service: Obstetrics;  Laterality: N/A;   COLONOSCOPY WITH PROPOFOL  10/07/2016   DILATION AND CURETTAGE OF UTERUS     ESOPHAGOGASTRODUODENOSCOPY  12/21/2015   EXCISION MASS UPPER EXTREMETIES Left 04/21/2017   Procedure: EXCISION MASS LEFT MIDDLE FINGER;  Surgeon: Daryll Brod, MD;  Location: [place];  Service: Orthopedics;  Laterality: Left;   IR FL GUIDED LOC OF NEEDLE/CATH TIP FOR SPINAL INJECTION RT  03/13/2020   PHOTOCOAGULATION WITH LASER Left 12/13/2020    Procedure: RETINOPLEXY LEFT EYE;  Surgeon: Sherlynn Stalls, MD;  Location: [place];  Service: Ophthalmology;  Laterality: Left;   SCLERAL BUCKLE Right 12/13/2020   Procedure: SCLERAL BUCKLE WITH CRYO;  Surgeon: Sherlynn Stalls, MD;  Location: [place];  Service: Ophthalmology;  Laterality: Right;    There were no vitals filed for this visit.   Subjective Assessment - 05/02/21 1702     Subjective Patient reports she is feeling good. No pain today because she was off work. She had aquatic PT and loved it.    Pertinent History acid reflux, alpha thalassemia trait, anemia, back pain, depression, gestational diabetes, HA, HTN, UTI, IBS, obesity, bilat plantar fasciitis, OSA, vertigo    Limitations Standing;Walking;House hold activities;Lifting    Patient Stated Goals not eliminate the pain, but get the pain to decrease and be able to do my daily living, walking/standing and pick my daughter up when standing and play with the kids to not get tired very quickly.    Currently in Pain? No/denies                               [facility] Adult PT Treatment/Exercise - 05/02/21 0001       Self-Care   Other Self-Care Comments  see patient education      Lumbar Exercises: Supine   Pelvic Tilt 10 reps    Pelvic Tilt Limitations x2; posterior pelvic tilt    Bent Knee Raise 10 reps    Bent Knee Raise Limitations x2; posterior pelvic tilt    Dead Bug 5 reps    Dead Bug Limitations LE only; x 4      Lumbar Exercises: Quadruped   Straight Leg Raise 10 reps    Straight Leg Raises Limitations x2    Other Quadruped Lumbar Exercises fire hydrant 2 x 10      Knee/Hip Exercises: Standing   Other Standing Knee Exercises sumo squat 2 x 5; 20 lbs      Knee/Hip Exercises: Sidelying   Other Sidelying Knee/Hip Exercises fwd/bwd leg taps 2 x 10 bilateral                    PT Education - 05/02/21 1807     Education Details updated HEP.    Person(s) Educated Patient    Methods  Explanation;Demonstration;Verbal cues;Handout    Comprehension Verbalized understanding;Returned demonstration;Verbal cues required  PT Short Term Goals - 05/02/21 1739       PT SHORT TERM GOAL #1   Title Patient will be independent with initial HEP.    Time 3    Period Weeks    Status Achieved    Target Date 04/02/21      PT SHORT TERM GOAL #2   Title Patient will demonstrate at least 10 degrees of Rt ankle dorsiflexion AROM to improve gait mechanics.    Time 4    Period Weeks    Status Achieved    Target Date 04/09/21      PT SHORT TERM GOAL #3   Title Patient will demonstrate 5/5 Rt ankle inversion strength to improve stability with walking.    Time 4    Period Weeks    Status Achieved    Target Date 04/09/21      PT SHORT TERM GOAL #4   Title Patient will complete overhead lifting of light weight (<5 lbs) with proper muscle sequencing in order to avoid overuse of upper traps with UE activity.    Baseline significant upper trap compensation with UE MMT.    Time 4    Period Weeks    Status Deferred    Target Date 04/09/21      PT SHORT TERM GOAL #5   Title Patient will squat 20 lbs without increased pain to improve ability to pick up her daughter in standing.    Baseline goblet squat 20 lbs no pain; reports no difficulty lifting her daughter at this time.    Time 4    Period Weeks    Status Achieved    Target Date 04/09/21               PT Long Term Goals - 04/25/21 1715       PT LONG TERM GOAL #1   Title Patient will be independent with advanced HEP to manage her chronic conditions.    Baseline progressing as appropriate    Time 8    Period Weeks    Status On-going      PT LONG TERM GOAL #2   Title Patient will complete 5 x STS in </=13 seconds for improve functional strength and to signify decreased fall risk.    Baseline 17.3 seconds    Time 8    Period Weeks    Status Deferred      PT LONG TERM GOAL #3   Title Patient will  maintain Rt SLS for at least 20 seconds to improve stability when walking on uneven surfaces.    Baseline 30 seconds    Time 8    Period Weeks    Status Achieved      PT LONG TERM GOAL #4   Title Patient will report ability to tolerate walking/standing for at least 1 hour with pain less than 2/10 to improve tolerance to work activity.    Baseline current pain 5/10    Time 8    Period Weeks    Status On-going                   Plan - 05/02/21 1708     Clinical Impression Statement Patient arrives without reports of pain and felt that aquatic PT session that she attended was very helpful yesterday as it was easier to perform exercises in the water and she could move without hurting. Today's land visit focused on core strengthening as she reports her aquatic session was focused on  her ankle. She quickly fatigues with dynamic core stabilization in supine. She demonstrates improved core stability with quadruped strengthening as she is able to maintain neutral alignment with quadruped leg extension and fire hydrant. She was able to squat 20 lbs with appropriate form without reports of pain, having met this short term functional goal. Occasional muscle cramping during session, otherwise no complaints.    Personal Factors and Comorbidities Age;Comorbidity 3+;Fitness;Profession;Time since onset of injury/illness/exacerbation    Comorbidities alpha thalassemia trait, anemia, back pain, depression, gestational diabetes, obesity, bilat plantar fasciitis, OSA, vertigo    Examination-Activity Limitations Stand;Locomotion Level;Lift    Examination-Participation Restrictions Occupation    PT Frequency 2x / week    PT Duration 8 weeks    PT Treatment/Interventions Moist Heat;Gait training;Stair training;Functional mobility training;Therapeutic activities;Therapeutic exercise;Balance training;Manual techniques;Patient/family education;Passive range of motion;Dry needling;Taping;ADLs/Self Care Home  Management;Aquatic Therapy;Cryotherapy;Neuromuscular re-education    PT Next Visit Plan gait training, balance, general UE/LE strengthening    PT Home Exercise Plan Access Code: [email or web address]    Consulted and Agree with Plan of Care Patient             Patient will benefit from skilled therapeutic intervention in order to improve the following deficits and impairments:  Difficulty walking, Abnormal gait, Decreased range of motion, Obesity, Pain, Postural dysfunction  Visit Diagnosis: Chronic bilateral low back pain, unspecified whether sciatica present  Cervicalgia  Chronic pain of both ankles  Difficulty in walking, not elsewhere classified  Abnormal posture     Problem List Patient Active Problem List   Diagnosis Date Noted   Encounter for insertion of mirena IUD 04/01/2021   Levator spasm 11/21/2020   Galactorrhea 11/19/2020   Hx of migraine headaches 04/09/2020   Contraceptive management 11/22/2019   Positive RPR test 10/09/2019   HTN (hypertension) 09/23/2019   Migraine headache 07/12/2019   Alpha thalassemia trait    Vitamin D deficiency 09/16/2017   Prediabetes 09/16/2017   Depression 09/16/2017   Class 3 severe obesity with serious comorbidity and body mass index (BMI) of 50.0 to 59.9 in adult ([place]) 09/16/2017   PCOS (polycystic ovarian syndrome) 08/13/2017   Mass 05/06/2017   Vertigo 10/06/2016   Microcytic anemia 09/26/2016   Pseudotumor cerebri syndrome 08/28/2016   Obesity hypoventilation syndrome ([place]) 08/28/2016   Benign paroxysmal positional vertigo due to bilateral vestibular disorder 08/28/2016   OSA (obstructive sleep apnea) 08/28/2016   Hypersomnia with sleep apnea 08/28/2016   Female hirsutism 08/28/2016   GERD (gastroesophageal reflux disease) 02/24/2015   Constipation 10/02/2014   Gwendolyn Grant, PT, DPT, ATC 05/02/21 6:18 PM PHYSICAL THERAPY DISCHARGE SUMMARY  Visits from Start of Care: 10  Current functional level related to goals /  functional outcomes: See goals above   Remaining deficits: Status unknown   Education / Equipment: N/A   Patient agrees to discharge. Patient goals were partially met. Patient is being discharged due to not returning since the last visit. Gwendolyn Grant, PT, DPT, ATC 06/03/21 4:36 PM  [facility] [address] [place], [place], [address] Phone: [phone]   Fax:  [phone]  Name: Alvetta Hidrogo Davison MRN: [id number] Date of Birth: 1996-05-19

## 2021-05-06 ENCOUNTER — Ambulatory Visit (HOSPITAL_COMMUNITY)
Admission: EM | Admit: 2021-05-06 | Discharge: 2021-05-06 | Disposition: A | Discharge status: Home / Self Care | Payer: Medicaid Other | Attending: Physician Assistant | Admitting: Physician Assistant

## 2021-05-06 ENCOUNTER — Ambulatory Visit (INDEPENDENT_AMBULATORY_CARE_PROVIDER_SITE_OTHER): Payer: Medicaid Other | Admitting: Family Medicine

## 2021-05-06 ENCOUNTER — Ambulatory Visit (INDEPENDENT_AMBULATORY_CARE_PROVIDER_SITE_OTHER): Payer: Medicaid Other

## 2021-05-06 ENCOUNTER — Other Ambulatory Visit: Payer: Self-pay

## 2021-05-06 ENCOUNTER — Encounter (INDEPENDENT_AMBULATORY_CARE_PROVIDER_SITE_OTHER): Payer: Self-pay | Admitting: Family Medicine

## 2021-05-06 ENCOUNTER — Encounter (HOSPITAL_COMMUNITY): Payer: Self-pay

## 2021-05-06 VITALS — BP 142/78 | HR 79 | Temp 98.0°F | Ht 74.0 in | Wt >= 6400 oz

## 2021-05-06 DIAGNOSIS — R0602 Shortness of breath: Secondary | ICD-10-CM

## 2021-05-06 DIAGNOSIS — R079 Chest pain, unspecified: Secondary | ICD-10-CM

## 2021-05-06 DIAGNOSIS — E559 Vitamin D deficiency, unspecified: Secondary | ICD-10-CM | POA: Diagnosis not present

## 2021-05-06 DIAGNOSIS — N644 Mastodynia: Secondary | ICD-10-CM | POA: Diagnosis not present

## 2021-05-06 DIAGNOSIS — E8881 Metabolic syndrome: Secondary | ICD-10-CM | POA: Diagnosis not present

## 2021-05-06 DIAGNOSIS — R059 Cough, unspecified: Secondary | ICD-10-CM | POA: Diagnosis not present

## 2021-05-06 DIAGNOSIS — D509 Iron deficiency anemia, unspecified: Secondary | ICD-10-CM

## 2021-05-06 DIAGNOSIS — Z6841 Body Mass Index (BMI) 40.0 and over, adult: Secondary | ICD-10-CM | POA: Diagnosis not present

## 2021-05-06 MED ORDER — VICTOZA 18 MG/3ML ~~LOC~~ SOPN: 1.8000 mg | PEN_INJECTOR | Freq: Every day | SUBCUTANEOUS | 0 refills | Status: DC

## 2021-05-06 MED ORDER — ALBUTEROL SULFATE HFA 108 (90 BASE) MCG/ACT IN AERS: 2.0000 | INHALATION_SPRAY | Freq: Four times a day (QID) | RESPIRATORY_TRACT | 0 refills | Status: DC | PRN

## 2021-05-06 MED ORDER — VITAMIN D (ERGOCALCIFEROL) 1.25 MG (50000 UNIT) PO CAPS: 50000.0000 [IU] | ORAL_CAPSULE | ORAL | 0 refills | Status: DC

## 2021-05-06 NOTE — ED Provider Notes (Signed)
[place]    CSN: [id number] Arrival date & time: 05/06/21  1754      History   Chief Complaint Chief Complaint  Patient presents with   Chest Pain   Shortness of Breath    HPI Maureen Lewis is a 25 y.o. female.   Pt complains of chest pain and shortness of breath that started last night while at rest.  She has a history of asthma and has been using her inhaler with some relief.  Reports mild non productive cough today.  Denies palpitations, fever, chills, n/v/d, wheezing, lower extremity edema, calf pain. No ocp, no recent travel. She reports since COVID diagnosis last year she has had pneumonia several times.  She is worried she might have pneumonia now.  Seen by PCP several times with similar sx.  Echo done previously as well with no findings to explain shortness of breath.    Past Medical History:  Diagnosis Date   Abnormal uterine bleeding    Acid reflux    Alpha thalassemia trait    Amenorrhea    Anemia    no current med.   Back pain    Cesarean delivery delivered 10/11/2019   10/09/2019 - primary CS for failed IOL   Chest pain    resolved   Complication of anesthesia    states had to keep giving her anesthesia during EGD   Constipation    Depression    "I'm good"   Fibromyalgia    Finger mass, left 03/2017   middle finger   Gestational diabetes 05/23/2019   hx - with pregnancy only    Gestational hypertension 09/23/2019   Guidelines for Antenatal Testing and Sonography  (with updated ICD-10 codes)  Updated  2019-09-13 with Dr. Tama High  INDICATION U/S 2 X week NST/AFI  or full BPP wkly DELIVERY Diabetes   A1 - good control - O24.410    A2 - good control - O24.419      A2  - poor control or poor compliance - O24.419, E11.65   (Macrosomia or polyhydramnios) **E11.65 is extra code for poor control**    A2/B - O24.   Headache    Hypertension    Infection    UTI   Irritable bowel syndrome (IBS)    Joint pain    Morbid obesity with body  mass index (BMI) of 45.0 to 49.9 in adult [facility])    Obesity during pregnancy, antepartum 04/06/2019   Body mass index is 53.71 kg/m.  Recommendations [x]  Aspirin 81 mg daily after 12 weeks; discontinue after 36 weeks [ ]  Nutrition consult [ ]  Weight gain 11-20 lbs for singleton and 25-35 lbs for twin pregnancy (IOM guidelines) Higher class of obesity patients recommended to gain closer to lower limit  Weight loss is associated with adverse outcomes [ ]  Baseline and surveillance labs (pulled in fr   Plantar fasciitis, bilateral    resolved   Polycystic ovary syndrome    Pre-diabetes    no meds, diet controlled, diet not check blood sugar   Prediabetes    "prediabetes"   Pregnancy induced hypertension    Shortness of breath    albuterol inhaler   Sleep apnea    no CPAP use   Supervision of normal first pregnancy 04/06/2019   [name]: [ x]Initial [ x]12 [ ] 20 [ ] 28 [ ] 32 [ ] 36 [ ] 38 [ ] 39 [ ] 40 Nursing Staff Provider Office Location [facility]  Dating   LMP Language  English  Anatomy US  Nml Flu Vaccine  Declined 07/28/19 Genetic Screen  NIPS: low risks   AFP:   negative  TDaP vaccine   08/25/19 Hgb A1C or  GTT Early A1C 5.8 Third trimester: GDM insulin Rhogam  NA   LAB RESULTS  Feeding Plan Breast Blood Type   Vertigo 2017    Patient Active Problem List   Diagnosis Date Noted   Encounter for insertion of mirena IUD 04/01/2021   Levator spasm 11/21/2020   Galactorrhea 11/19/2020   Hx of migraine headaches 04/09/2020   Contraceptive management 11/22/2019   Positive RPR test 10/09/2019   HTN (hypertension) 09/23/2019   Migraine headache 07/12/2019   Alpha thalassemia trait    Vitamin D deficiency 09/16/2017   Prediabetes 09/16/2017   Depression 09/16/2017   Class 3 severe obesity with serious comorbidity and body mass index (BMI) of 50.0 to 59.9 in adult ([place]) 09/16/2017   PCOS (polycystic ovarian syndrome) 08/13/2017   Mass 05/06/2017   Vertigo 10/06/2016   Microcytic anemia  09/26/2016   Pseudotumor cerebri syndrome 08/28/2016   Obesity hypoventilation syndrome ([place]) 08/28/2016   Benign paroxysmal positional vertigo due to bilateral vestibular disorder 08/28/2016   OSA (obstructive sleep apnea) 08/28/2016   Hypersomnia with sleep apnea 08/28/2016   Female hirsutism 08/28/2016   GERD (gastroesophageal reflux disease) 02/24/2015   Constipation 10/02/2014    Past Surgical History:  Procedure Laterality Date   CESAREAN SECTION N/A 10/09/2019   Procedure: CESAREAN SECTION;  Surgeon: Aletha Halim, MD;  Location: [facility] LD [facility];  Service: Obstetrics;  Laterality: N/A;   COLONOSCOPY WITH PROPOFOL  10/07/2016   DILATION AND CURETTAGE OF UTERUS     ESOPHAGOGASTRODUODENOSCOPY  12/21/2015   EXCISION MASS UPPER EXTREMETIES Left 04/21/2017   Procedure: EXCISION MASS LEFT MIDDLE FINGER;  Surgeon: Daryll Brod, MD;  Location: [place];  Service: Orthopedics;  Laterality: Left;   IR FL GUIDED LOC OF NEEDLE/CATH TIP FOR SPINAL INJECTION RT  03/13/2020   PHOTOCOAGULATION WITH LASER Left 12/13/2020   Procedure: RETINOPLEXY LEFT EYE;  Surgeon: Sherlynn Stalls, MD;  Location: [place];  Service: Ophthalmology;  Laterality: Left;   SCLERAL BUCKLE Right 12/13/2020   Procedure: SCLERAL BUCKLE WITH CRYO;  Surgeon: Sherlynn Stalls, MD;  Location: [place];  Service: Ophthalmology;  Laterality: Right;    OB History     Gravida  1   Para  1   Term  1   Preterm  0   AB  0   Living  1      SAB  0   IAB  0   Ectopic  0   Multiple  0   Live Births  1            Home Medications    Prior to Admission medications   Medication Sig Start Date End Date Taking? Authorizing Provider  acetaZOLAMIDE (DIAMOX) 500 MG capsule Take 1 capsule (500 mg total) by mouth 2 (two) times daily. For headache 08/16/20   Gregor Hams, MD  albuterol (VENTOLIN HFA) 108 (90 Base) MCG/ACT inhaler Inhale 2 puffs into the lungs every 6 (six) hours as needed for wheezing or shortness  of breath. 09/28/20   Nafziger, Tommi Rumps, NP  amLODipine (NORVASC) 10 MG tablet Take 1 tablet (10 mg total) by mouth daily. 11/08/20   Nafziger, Tommi Rumps, NP  diclofenac (VOLTAREN) 75 MG EC tablet Take 1 tablet (75 mg total) by mouth 2 (two) times daily. 07/11/20   Dene Gentry, MD  ferrous sulfate 325 (65 FE) MG tablet Take 1 tablet (325 mg total) by mouth every other day. Patient taking differently: Take 325 mg by mouth every Monday, Wednesday, and Friday. 02/23/20 08/27/20  Nafziger, Tommi Rumps, NP  hydrochlorothiazide (HYDRODIURIL) 25 MG tablet Take 1 tablet (25 mg total) by mouth daily. 11/08/20   Nafziger, Tommi Rumps, NP  ibuprofen (ADVIL) 800 MG tablet Take 800 mg by mouth every 8 (eight) hours as needed. 11/26/20   [provider]  liraglutide (VICTOZA) 18 MG/3ML SOPN Inject 1.8 mg into the skin daily. 1.8 05/06/21   Laqueta Linden, MD  loratadine (CLARITIN) 10 MG tablet Take 10 mg by mouth daily as needed for allergies.     [provider]  naproxen (NAPROSYN) 500 MG tablet Take 1 tablet (500 mg total) by mouth 2 (two) times daily with a meal. 03/29/21   Jaynee Eagles, PA-C  polyethylene glycol powder (GLYCOLAX/MIRALAX) 17 GM/SCOOP powder Take 17 g by mouth daily as needed for moderate constipation.  10/11/19   [provider]  tiZANidine (ZANAFLEX) 4 MG tablet Take 1 tablet (4 mg total) by mouth every 6 (six) hours as needed for muscle spasms. 02/14/21   Gregor Hams, MD  Vitamin D, Ergocalciferol, (DRISDOL) 1.25 MG (50000 UNIT) CAPS capsule Take 1 capsule (50,000 Units total) by mouth every 7 (seven) days. 05/06/21   Laqueta Linden, MD    Family History Family History  Problem Relation Age of Onset   Diabetes Maternal Aunt    Hypertension Maternal Uncle    Depression Maternal Grandfather    Diabetes Maternal Grandfather    Hypertension Maternal Grandfather    Arthritis Mother    High blood pressure Mother    Depression Mother    Sleep apnea Mother    Obesity Mother     Diabetes Mother    Hypertension Mother    Kidney failure Mother    Sleep apnea Father    Obesity Father    Healthy Daughter     Social History Social History   Tobacco Use   Smoking status: Never   Smokeless tobacco: Never  Vaping Use   Vaping Use: Never used  Substance Use Topics   Alcohol use: Not Currently    Comment: social    Drug use: No     Allergies   Bee pollen, Bee venom, Hydrocodone-acetaminophen, Peach flavor, Pollen extract, Remdesivir, Cortisone, and Cortizone-10 [hydrocortisone]   Review of Systems Review of Systems  Constitutional:  Negative for chills and fever.  HENT:  Negative for ear pain and sore throat.   Eyes:  Negative for pain and visual disturbance.  Respiratory:  Positive for chest tightness and shortness of breath. Negative for cough and wheezing.   Cardiovascular:  Negative for chest pain and palpitations.  Gastrointestinal:  Negative for abdominal pain and vomiting.  Genitourinary:  Negative for dysuria and hematuria.  Musculoskeletal:  Negative for arthralgias and back pain.  Skin:  Negative for color change and rash.  Neurological:  Negative for seizures and syncope.  All other systems reviewed and are negative.   Physical Exam Triage Vital Signs ED Triage Vitals  Enc Vitals Group     BP 05/06/21 1819 (!) 142/75     Pulse Rate 05/06/21 1819 82     Resp 05/06/21 1819 20     Temp 05/06/21 1819 98.5 F (36.9 C)     Temp Source 05/06/21 1819 Oral     SpO2 05/06/21 1819 98 %  Weight --      Height --      Head Circumference --      Peak Flow --      Pain Score 05/06/21 1817 6     Pain Loc --      Pain Edu? --      Excl. in [place]? --    No data found.  Updated Vital Signs BP (!) 142/75 (BP Location: Right Wrist)   Pulse 82   Temp 98.5 F (36.9 C) (Oral)   Resp 20   LMP  (Within Weeks) Comment: 1 week  SpO2 98%   Breastfeeding No   Visual Acuity Right Eye Distance:   Left Eye Distance:   Bilateral Distance:     Right Eye Near:   Left Eye Near:    Bilateral Near:     Physical Exam Vitals and nursing note reviewed.  Constitutional:      General: She is not in acute distress.    Appearance: She is well-developed.  HENT:     Head: Normocephalic and atraumatic.  Eyes:     Conjunctiva/sclera: Conjunctivae normal.  Cardiovascular:     Rate and Rhythm: Normal rate and regular rhythm.     Heart sounds: No murmur heard. Pulmonary:     Effort: Pulmonary effort is normal. No respiratory distress.     Breath sounds: Normal breath sounds.  Abdominal:     Palpations: Abdomen is soft.     Tenderness: There is no abdominal tenderness.  Musculoskeletal:     Cervical back: Neck supple.  Skin:    General: Skin is warm and dry.  Neurological:     Mental Status: She is alert.     UC Treatments / Results  Labs (all labs ordered are listed, but only abnormal results are displayed) Labs Reviewed - No data to display  EKG   Radiology No results found.  Procedures Procedures (including critical care time)  Medications Ordered in UC Medications - No data to display  Initial Impression / Assessment and Plan / UC Course  I have reviewed the triage vital signs and the nursing notes.  Pertinent labs & imaging results that were available during my care of the patient were reviewed by me and considered in my medical decision making (see chart for details).     Cardiac etiology not suspected. Pt well appearing, O2 98%, no active wheezing or respiratory distress.  EKG NSR.  Chest xray with no acute findings.  Stable for discharge. Return precautions discussed.  Advised to continue with albuterol inhaler, refill given today. Follow up with PCP. Return precautions discussed.  Final Clinical Impressions(s) / UC Diagnoses   Final diagnoses:  None   Discharge Instructions   None    ED Prescriptions   None    PDMP not reviewed this encounter.   Konrad Felix, PA-C 05/06/21 1927

## 2021-05-06 NOTE — Discharge Instructions (Addendum)
Continue with inhaler as needed.  Follow up with primary care if needed.  Return if you develop worsening symptoms.

## 2021-05-06 NOTE — ED Triage Notes (Signed)
Pt reports shortness of breath, cough and pain under right breast since last night. Shortness of breath is worse when pt lay down; chest pain is worse when taking dee breath. Denies headache, vision changes, dizziness, abdominal pain, diarrhea, nausea, vomiting.

## 2021-05-06 NOTE — ED Notes (Signed)
EKG result given to provider

## 2021-05-07 ENCOUNTER — Ambulatory Visit: Payer: Medicaid Other

## 2021-05-07 LAB — ANEMIA PANEL
Ferritin: 57 ng/mL (ref 15–150)
Folate, Hemolysate: 214 ng/mL
Folate, RBC: 505 ng/mL (ref >498)
Hematocrit: 42.4 % (ref 34.0–46.6)
Iron Saturation: 13 % — ABNORMAL LOW (ref 15–55)
Iron: 47 ug/dL (ref 27–159)
Retic Ct Pct: 1.6 % (ref 0.6–2.6)
Total Iron Binding Capacity: 360 ug/dL (ref 250–450)
UIBC: 313 ug/dL (ref 131–425)
Vitamin B-12: 272 pg/mL (ref 232–1245)

## 2021-05-07 LAB — COMPREHENSIVE METABOLIC PANEL
ALT: 19 IU/L (ref 0–32)
AST: 12 IU/L (ref 0–40)
Albumin/Globulin Ratio: 1.4 (ref 1.2–2.2)
Albumin: 4 g/dL (ref 3.9–5.0)
Alkaline Phosphatase: 118 IU/L (ref 44–121)
BUN/Creatinine Ratio: 20 (ref 9–23)
BUN: 11 mg/dL (ref 6–20)
Bilirubin Total: 0.2 mg/dL (ref 0.0–1.2)
CO2: 22 mmol/L (ref 20–29)
Calcium: 8.7 mg/dL (ref 8.7–10.2)
Chloride: 104 mmol/L (ref 96–106)
Creatinine, Ser: 0.55 mg/dL — ABNORMAL LOW (ref 0.57–1.00)
Globulin, Total: 2.9 g/dL (ref 1.5–4.5)
Glucose: 120 mg/dL — ABNORMAL HIGH (ref 65–99)
Potassium: 4.6 mmol/L (ref 3.5–5.2)
Sodium: 140 mmol/L (ref 134–144)
Total Protein: 6.9 g/dL (ref 6.0–8.5)
eGFR: 133 mL/min/{1.73_m2} (ref >59)

## 2021-05-07 LAB — CBC WITH DIFFERENTIAL/PLATELET
Basophils Absolute: 0 10*3/uL (ref 0.0–0.2)
Basos: 0 %
EOS (ABSOLUTE): 0.1 10*3/uL (ref 0.0–0.4)
Eos: 1 %
Hemoglobin: 13 g/dL (ref 11.1–15.9)
Immature Grans (Abs): 0 10*3/uL (ref 0.0–0.1)
Immature Granulocytes: 0 %
Lymphocytes Absolute: 1.9 10*3/uL (ref 0.7–3.1)
Lymphs: 30 %
MCH: 22.8 pg — ABNORMAL LOW (ref 26.6–33.0)
MCHC: 30.7 g/dL — ABNORMAL LOW (ref 31.5–35.7)
MCV: 74 fL — ABNORMAL LOW (ref 79–97)
Monocytes Absolute: 0.4 10*3/uL (ref 0.1–0.9)
Monocytes: 7 %
Neutrophils Absolute: 4 10*3/uL (ref 1.4–7.0)
Neutrophils: 62 %
Platelets: 268 10*3/uL (ref 150–450)
RBC: 5.71 x10E6/uL — ABNORMAL HIGH (ref 3.77–5.28)
RDW: 15.4 % (ref 11.7–15.4)
WBC: 6.4 10*3/uL (ref 3.4–10.8)

## 2021-05-07 LAB — INSULIN, RANDOM: INSULIN: 74.7 u[IU]/mL — ABNORMAL HIGH (ref 2.6–24.9)

## 2021-05-07 LAB — HEMOGLOBIN A1C
Est. average glucose Bld gHb Est-mCnc: 126 mg/dL
Hgb A1c MFr Bld: 6 % — ABNORMAL HIGH (ref 4.8–5.6)

## 2021-05-09 ENCOUNTER — Encounter (HOSPITAL_COMMUNITY): Payer: Self-pay

## 2021-05-09 ENCOUNTER — Ambulatory Visit: Payer: Medicaid Other | Admitting: Physical Therapy

## 2021-05-09 ENCOUNTER — Ambulatory Visit (INDEPENDENT_AMBULATORY_CARE_PROVIDER_SITE_OTHER): Payer: Medicaid Other

## 2021-05-09 ENCOUNTER — Other Ambulatory Visit: Payer: Self-pay

## 2021-05-09 ENCOUNTER — Ambulatory Visit (HOSPITAL_COMMUNITY)
Admission: EM | Admit: 2021-05-09 | Discharge: 2021-05-09 | Disposition: A | Discharge status: Home / Self Care | Payer: Medicaid Other | Attending: Family Medicine | Admitting: Family Medicine

## 2021-05-09 DIAGNOSIS — R0602 Shortness of breath: Secondary | ICD-10-CM

## 2021-05-09 DIAGNOSIS — J4531 Mild persistent asthma with (acute) exacerbation: Secondary | ICD-10-CM

## 2021-05-09 DIAGNOSIS — R059 Cough, unspecified: Secondary | ICD-10-CM

## 2021-05-09 MED ORDER — BENZONATATE 200 MG PO CAPS: 200.0000 mg | ORAL_CAPSULE | Freq: Three times a day (TID) | ORAL | 0 refills | Status: DC | PRN

## 2021-05-09 MED ORDER — PREDNISONE 20 MG PO TABS: 40.0000 mg | ORAL_TABLET | Freq: Every day | ORAL | 0 refills | Status: DC

## 2021-05-09 NOTE — ED Triage Notes (Signed)
Worsening cough and runny nose.  Denies fever.  Reports phlegm is "red and yellow".  Patient has been sleeping in a recliner since Monday.  States she feels like she is "suffocating" when lying down.  Inhalers not working per patient

## 2021-05-09 NOTE — ED Provider Notes (Signed)
Maureen Lewis    CSN: [id number] Arrival date & time: 05/09/21  7741      History   Chief Complaint Chief Complaint  Patient presents with   Chest Pain   Shortness of Breath    HPI Maureen Lewis is a 25 y.o. female.   Patient presenting today with ongoing cough and congestion for 2 weeks or so now.  She states the past 3 days symptoms have worsened and she is having right-sided lower chest pain and shortness of breath with lying down.  She denies fever, chills, dizziness, abdominal pain, nausea vomiting or diarrhea.  Was seen 3 days ago and given an albuterol inhaler which she states has not been helping with current symptoms.  History of asthma, sleep apnea, hypoventilation syndrome.  Does endorse multiple episodes of pneumonia last year with COVID.   Past Medical History:  Diagnosis Date   Abnormal uterine bleeding    Acid reflux    Alpha thalassemia trait    Amenorrhea    Anemia    no current med.   Back pain    Cesarean delivery delivered 10/11/2019   10/09/2019 - primary CS for failed IOL   Chest pain    resolved   Complication of anesthesia    states had to keep giving her anesthesia during EGD   Constipation    Depression    "I'm good"   Fibromyalgia    Finger mass, left 03/2017   middle finger   Gestational diabetes 05/23/2019   hx - with pregnancy only    Gestational hypertension 09/23/2019   Guidelines for Antenatal Testing and Sonography  (with updated ICD-10 codes)  Updated  07-Oct-2019 with Dr. Tama High  INDICATION U/S 2 X week NST/AFI  or full BPP wkly DELIVERY Diabetes   A1 - good control - O24.410    A2 - good control - O24.419      A2  - poor control or poor compliance - O24.419, E11.65   (Macrosomia or polyhydramnios) **E11.65 is extra code for poor control**    A2/B - O24.   Headache    Hypertension    Infection    UTI   Irritable bowel syndrome (IBS)    Joint pain    Morbid obesity with body mass index (BMI) of 45.0 to  49.9 in adult [facility])    Obesity during pregnancy, antepartum 04/06/2019   Body mass index is 53.71 kg/m.  Recommendations [x]  Aspirin 81 mg daily after 12 weeks; discontinue after 36 weeks [ ]  Nutrition consult [ ]  Weight gain 11-20 lbs for singleton and 25-35 lbs for twin pregnancy (IOM guidelines) Higher class of obesity patients recommended to gain closer to lower limit  Weight loss is associated with adverse outcomes [ ]  Baseline and surveillance labs (pulled in fr   Plantar fasciitis, bilateral    resolved   Polycystic ovary syndrome    Pre-diabetes    no meds, diet controlled, diet not check blood sugar   Prediabetes    "prediabetes"   Pregnancy induced hypertension    Shortness of breath    albuterol inhaler   Sleep apnea    no CPAP use   Supervision of normal first pregnancy 04/06/2019   [name]: [ x]Initial [ x]12 [ ] 20 [ ] 28 [ ] 32 [ ] 36 [ ] 38 [ ] 39 [ ] 40 Nursing Staff Provider Office Location [facility]  Dating   LMP Language  English  Anatomy US  Nml Flu Vaccine  Declined 07/28/19 Genetic  Screen  NIPS: low risks   AFP:   negative  TDaP vaccine   08/25/19 Hgb A1C or  GTT Early A1C 5.8 Third trimester: GDM insulin Rhogam  NA   LAB RESULTS  Feeding Plan Breast Blood Type   Vertigo 2017    Patient Active Problem List   Diagnosis Date Noted   Encounter for insertion of mirena IUD 04/01/2021   Levator spasm 11/21/2020   Galactorrhea 11/19/2020   Hx of migraine headaches 04/09/2020   Contraceptive management 11/22/2019   Positive RPR test 10/09/2019   HTN (hypertension) 09/23/2019   Migraine headache 07/12/2019   Alpha thalassemia trait    Vitamin D deficiency 09/16/2017   Prediabetes 09/16/2017   Depression 09/16/2017   Class 3 severe obesity with serious comorbidity and body mass index (BMI) of 50.0 to 59.9 in adult [facility]) 09/16/2017   PCOS (polycystic ovarian syndrome) 08/13/2017   Mass 05/06/2017   Vertigo 10/06/2016   Microcytic anemia 09/26/2016   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   Obesity hypoventilation syndrome ([place]) 08/28/2016   Benign paroxysmal positional vertigo due to bilateral vestibular disorder 08/28/2016   OSA (obstructive sleep apnea) 08/28/2016   Hypersomnia with sleep apnea 08/28/2016   Female hirsutism 08/28/2016   GERD (gastroesophageal reflux disease) 02/24/2015   Constipation 10/02/2014    Past Surgical History:  Procedure Laterality Date   CESAREAN SECTION N/A 10/09/2019   Procedure: CESAREAN SECTION;  Surgeon: Aletha Halim, MD;  Location: [facility] LD ORS;  Service: Obstetrics;  Laterality: N/A;   COLONOSCOPY WITH PROPOFOL  10/07/2016   DILATION AND CURETTAGE OF UTERUS     ESOPHAGOGASTRODUODENOSCOPY  12/21/2015   EXCISION MASS UPPER EXTREMETIES Left 04/21/2017   Procedure: EXCISION MASS LEFT MIDDLE FINGER;  Surgeon: Daryll Brod, MD;  Location: [place];  Service: Orthopedics;  Laterality: Left;   IR FL GUIDED LOC OF NEEDLE/CATH TIP FOR SPINAL INJECTION RT  03/13/2020   PHOTOCOAGULATION WITH LASER Left 12/13/2020   Procedure: RETINOPLEXY LEFT EYE;  Surgeon: Sherlynn Stalls, MD;  Location: [place];  Service: Ophthalmology;  Laterality: Left;   SCLERAL BUCKLE Right 12/13/2020   Procedure: SCLERAL BUCKLE WITH CRYO;  Surgeon: Sherlynn Stalls, MD;  Location: [place];  Service: Ophthalmology;  Laterality: Right;    OB History     Gravida  1   Para  1   Term  1   Preterm  0   AB  0   Living  1      SAB  0   IAB  0   Ectopic  0   Multiple  0   Live Births  1            Home Medications    Prior to Admission medications   Medication Sig Start Date End Date Taking? Authorizing Provider  benzonatate (TESSALON) 200 MG capsule Take 1 capsule (200 mg total) by mouth 3 (three) times daily as needed for cough. 05/09/21  Yes Volney American, PA-C  predniSONE (DELTASONE) 20 MG tablet Take 2 tablets (40 mg total) by mouth daily with breakfast. 05/09/21  Yes Volney American, PA-C   acetaZOLAMIDE (DIAMOX) 500 MG capsule Take 1 capsule (500 mg total) by mouth 2 (two) times daily. For headache 08/16/20   Gregor Hams, MD  albuterol (VENTOLIN HFA) 108 (90 Base) MCG/ACT inhaler Inhale 2 puffs into the lungs every 6 (six) hours as needed for wheezing or shortness of breath. 05/06/21   Konrad Felix, PA-C  amLODipine (NORVASC) 10 MG  tablet Take 1 tablet (10 mg total) by mouth daily. 11/08/20   Nafziger, Tommi Rumps, NP  diclofenac (VOLTAREN) 75 MG EC tablet Take 1 tablet (75 mg total) by mouth 2 (two) times daily. 07/11/20   Hudnall, Sharyn Lull, MD  ferrous sulfate 325 (65 FE) MG tablet Take 1 tablet (325 mg total) by mouth every other day. Patient taking differently: Take 325 mg by mouth every Monday, Wednesday, and Friday. 02/23/20 08/27/20  Nafziger, Tommi Rumps, NP  hydrochlorothiazide (HYDRODIURIL) 25 MG tablet Take 1 tablet (25 mg total) by mouth daily. 11/08/20   Nafziger, Tommi Rumps, NP  ibuprofen (ADVIL) 800 MG tablet Take 800 mg by mouth every 8 (eight) hours as needed. 11/26/20   [provider]  liraglutide (VICTOZA) 18 MG/3ML SOPN Inject 1.8 mg into the skin daily. 1.8 05/06/21   Laqueta Linden, MD  loratadine (CLARITIN) 10 MG tablet Take 10 mg by mouth daily as needed for allergies.     [provider]  naproxen (NAPROSYN) 500 MG tablet Take 1 tablet (500 mg total) by mouth 2 (two) times daily with a meal. 03/29/21   Jaynee Eagles, PA-C  polyethylene glycol powder (GLYCOLAX/MIRALAX) 17 GM/SCOOP powder Take 17 g by mouth daily as needed for moderate constipation.  10/11/19   [provider]  tiZANidine (ZANAFLEX) 4 MG tablet Take 1 tablet (4 mg total) by mouth every 6 (six) hours as needed for muscle spasms. 02/14/21   Gregor Hams, MD  Vitamin D, Ergocalciferol, (DRISDOL) 1.25 MG (50000 UNIT) CAPS capsule Take 1 capsule (50,000 Units total) by mouth every 7 (seven) days. 05/06/21   Laqueta Linden, MD    Family History Family History  Problem Relation Age of  Onset   Diabetes Maternal Aunt    Hypertension Maternal Uncle    Depression Maternal Grandfather    Diabetes Maternal Grandfather    Hypertension Maternal Grandfather    Arthritis Mother    High blood pressure Mother    Depression Mother    Sleep apnea Mother    Obesity Mother    Diabetes Mother    Hypertension Mother    Kidney failure Mother    Sleep apnea Father    Obesity Father    Healthy Daughter     Social History Social History   Tobacco Use   Smoking status: Never   Smokeless tobacco: Never  Vaping Use   Vaping Use: Never used  Substance Use Topics   Alcohol use: Not Currently    Comment: social    Drug use: No     Allergies   Bee pollen, Bee venom, Hydrocodone-acetaminophen, Peach flavor, Pollen extract, Remdesivir, Cortisone, and Cortizone-10 [hydrocortisone]   Review of Systems Review of Systems Per HPI  Physical Exam Triage Vital Signs ED Triage Vitals  Enc Vitals Group     BP 05/09/21 0839 (!) 144/91     Pulse Rate 05/09/21 0839 93     Resp 05/09/21 0839 20     Temp 05/09/21 0839 98.3 F (36.8 C)     Temp Source 05/09/21 0839 Oral     SpO2 05/09/21 0839 99 %     Weight --      Height --      Head Circumference --      Peak Flow --      Pain Score 05/09/21 0837 8     Pain Loc --      Pain Edu? --      Excl. in [place]? --  No data found.  Updated Vital Signs BP (!) 144/91 (BP Location: Right Wrist)   Pulse 93   Temp 98.3 F (36.8 C) (Oral)   Resp 20   LMP  (Within Weeks) Comment: 3 weeks  SpO2 99%   Breastfeeding No   Visual Acuity Right Eye Distance:   Left Eye Distance:   Bilateral Distance:    Right Eye Near:   Left Eye Near:    Bilateral Near:     Physical Exam Vitals and nursing note reviewed.  Constitutional:      Appearance: Normal appearance. She is obese. She is not ill-appearing.  HENT:     Head: Atraumatic.     Right Ear: Tympanic membrane normal.     Left Ear: Tympanic membrane normal.     Nose: Nose  normal.     Mouth/Throat:     Mouth: Mucous membranes are moist.     Pharynx: Oropharynx is clear. No oropharyngeal exudate.  Eyes:     Extraocular Movements: Extraocular movements intact.     Conjunctiva/sclera: Conjunctivae normal.  Cardiovascular:     Rate and Rhythm: Normal rate and regular rhythm.     Heart sounds: Normal heart sounds.  Pulmonary:     Effort: Pulmonary effort is normal. No respiratory distress.     Breath sounds: Normal breath sounds. No wheezing or rales.  Abdominal:     General: Bowel sounds are normal. There is no distension.     Palpations: Abdomen is soft.     Tenderness: There is no abdominal tenderness. There is no guarding.  Musculoskeletal:        General: Normal range of motion.     Cervical back: Normal range of motion and neck supple.  Skin:    General: Skin is warm and dry.  Neurological:     Mental Status: She is alert and oriented to person, place, and time.  Psychiatric:        Mood and Affect: Mood normal.        Thought Content: Thought content normal.        Judgment: Judgment normal.   UC Treatments / Results  Labs (all labs ordered are listed, but only abnormal results are displayed) Labs Reviewed - No data to display  EKG  Radiology DG Chest 2 View  Result Date: 05/09/2021 CLINICAL DATA:  Cough and shortness of breath EXAM: CHEST - 2 VIEW COMPARISON:  05/06/2021 FINDINGS: Suboptimal detail due to soft tissue attenuation. There is no edema, consolidation, effusion, or pneumothorax. Normal heart size and mediastinal contours. IMPRESSION: Negative for pneumonia. Electronically Signed   By: Monte Fantasia M.D.   On: 05/09/2021 09:48    Procedures Procedures (including critical care time)  Medications Ordered in UC Medications - No data to display  Initial Impression / Assessment and Plan / UC Course  I have reviewed the triage vital signs and the nursing notes.  Pertinent labs & imaging results that were available during my  care of the patient were reviewed by me and considered in my medical decision making (see chart for details).     Well-appearing today with benign appearing vital signs overall-oxygen saturation is 99% on room air, normal heart rate and respirations, afebrile.  EKG sinus rhythm with occasional PVCs, no ST or T wave changes.  Chest x-ray is negative for acute pulmonary abnormality.  Suspect inflammatory cause of symptoms, continue albuterol inhaler as needed for asthma and will do prednisone burst additionally.  On allergy list, has cortisone  topical listed or itching and swelling but states that she does well on oral prednisone and has never had a reaction to this in the past.  Also sent Tessalon Perles for cough as she states these have helped in the past.  Return for acutely worsening symptoms.  Final Clinical Impressions(s) / UC Diagnoses   Final diagnoses:  Mild persistent asthma with exacerbation  Shortness of breath  Cough   Discharge Instructions   None    ED Prescriptions     Medication Sig Dispense Auth. Provider   predniSONE (DELTASONE) 20 MG tablet Take 2 tablets (40 mg total) by mouth daily with breakfast. 10 tablet Volney American, PA-C   benzonatate (TESSALON) 200 MG capsule Take 1 capsule (200 mg total) by mouth 3 (three) times daily as needed for cough. 20 capsule Volney American, [place]      PDMP not reviewed this encounter.   Volney American, [place] 05/09/21 1106

## 2021-05-09 NOTE — ED Triage Notes (Signed)
Pt reports right sided chest pain and shortness of breath x 3 days. Reports pain is under right breast. Pain is worse when laying down and breathing. Reports she was seen for same chief complaint on 05/06/2021.

## 2021-05-09 NOTE — Progress Notes (Signed)
Chief Complaint:   OBESITY Maureen Lewis is here to discuss her progress with her obesity treatment plan along with follow-up of her obesity related diagnoses. Maureen Lewis is on the Category 4 Plan and states she is following her eating plan approximately 85-90% of the time. Maureen Lewis states she is walking and recumbent bike 120 minutes 3 times per week.  Today's visit was #: 10 Starting weight: 437 lbs Starting date: 04/09/2020 Today's weight: 477 lbs Today's date: 05/06/2021 Total lbs lost to date: 0 Total lbs lost since last in-office visit: 0  Interim History: Lacara's daughter has been in the hospital for over a week for severe allergies and asthma. She has been able to follow plan strictly while at home, but when daughter was hospitalized it was difficult to follow the plan. She has no upcoming plans.  Subjective:   1. Insulin resistance Maureen Lewis is on 1.8 mg Victoza with no change in appetite or cravings. Her last A1c was 6.1.  2. Metabolic syndrome She reports no real change on Victoza and an increase in cravings.  3. Vitamin D deficiency Pt denies nausea, vomiting, and muscle weakness but notes fatigue. Pt is on prescription Vit D.  4. Microcytic anemia Maureen Lewis is on oral iron replacement. Her last MCV was 71.8.  Assessment/Plan:   1. Insulin resistance Maureen Lewis will continue to work on weight loss, exercise, and decreasing simple carbohydrates to help decrease the risk of diabetes. Maureen Lewis agreed to follow-up with [name] as directed to closely monitor her progress. Check labs today.  - Hemoglobin A1c - Insulin, random  2. Metabolic syndrome Check labs today and refill Victoza 1.8 mg, as prescribed below.  Refill- liraglutide (VICTOZA) 18 MG/3ML SOPN; Inject 1.8 mg into the skin daily. 1.8  Dispense: 6 mL; Refill: 0 - Comprehensive metabolic panel  3. Vitamin D deficiency Low Vitamin D level contributes to fatigue and are associated with obesity, breast, and colon cancer. She agrees to  continue to take prescription Vitamin D @50 ,000 IU every week and will follow-up for routine testing of Vitamin D, at least 2-3 times per year to avoid over-replacement.  Refill- Vitamin D, Ergocalciferol, (DRISDOL) 1.25 MG (50000 UNIT) CAPS capsule; Take 1 capsule (50,000 Units total) by mouth every 7 (seven) days.  Dispense: 4 capsule; Refill: 0  4. Microcytic anemia Check labs today.  - Anemia panel - CBC w/Diff/Platelet  5. Class 3 severe obesity with serious comorbidity and body mass index (BMI) of 60.0 to 69.9 in adult, unspecified obesity type (HCC)  Maureen Lewis is currently in the action stage of change. As such, her goal is to continue with weight loss efforts. She has agreed to the Category 4 Plan.   Exercise goals: All adults should avoid inactivity. Some physical activity is better than none, and adults who participate in any amount of physical activity gain some health benefits. Pt is to add 10-15 minutes of resistance training 3 times a week.  Behavioral modification strategies: increasing lean protein intake, no skipping meals, meal planning and cooking strategies, and keeping healthy foods in the home.  Maureen Lewis has agreed to follow-up with our clinic in 2-3 weeks. She was informed of the importance of frequent follow-up visits to maximize her success with intensive lifestyle modifications for her multiple health conditions.   Maureen Lewis was informed we would discuss her lab results at her next visit unless there is a critical issue that needs to be addressed sooner. Maureen Lewis agreed to keep her next visit at the agreed upon time  to discuss these results.  Objective:   Blood pressure (!) 142/78, pulse 79, temperature 98 F (36.7 C), height 6\' 2"  (1.88 m), weight (!) 477 lb (216.4 kg), SpO2 96 %, not currently breastfeeding. Body mass index is 61.24 kg/m.  General: Cooperative, alert, well developed, in no acute distress. HEENT: Conjunctivae and lids unremarkable. Cardiovascular: Regular  rhythm.  Lungs: Normal work of breathing. Neurologic: No focal deficits.   Lab Results  Component Value Date   CREATININE 0.55 (L) 05/06/2021   BUN 11 05/06/2021   NA 140 05/06/2021   K 4.6 05/06/2021   CL 104 05/06/2021   CO2 22 05/06/2021   Lab Results  Component Value Date   ALT 19 05/06/2021   AST 12 05/06/2021   ALKPHOS 118 05/06/2021   BILITOT 0.2 05/06/2021   Lab Results  Component Value Date   HGBA1C 6.0 (H) 05/06/2021   HGBA1C 6.1 11/08/2020   HGBA1C 6.0 (H) 04/09/2020   HGBA1C 5.8 (H) 04/06/2019   HGBA1C 6.0 04/27/2018   Lab Results  Component Value Date   INSULIN 74.7 (H) 05/06/2021   INSULIN 36.8 (H) 04/09/2020   INSULIN 42.1 (H) 08/13/2017   Lab Results  Component Value Date   TSH 1.29 03/20/2021   Lab Results  Component Value Date   CHOL 118 11/08/2020   HDL 36.00 (L) 11/08/2020   LDLCALC 69 11/08/2020   TRIG 64.0 11/08/2020   CHOLHDL 3 11/08/2020   Lab Results  Component Value Date   VD25OH 10.82 (L) 03/20/2021   VD25OH 15.7 (L) 01/14/2021   VD25OH 12.1 (L) 04/09/2020   Lab Results  Component Value Date   WBC 6.4 05/06/2021   HGB 13.0 05/06/2021   HCT 42.4 05/06/2021   MCV 74 (L) 05/06/2021   PLT 268 05/06/2021   Lab Results  Component Value Date   IRON 47 05/06/2021   TIBC 360 05/06/2021   FERRITIN 57 05/06/2021    Attestation Statements:   Reviewed by clinician on day of visit: allergies, medications, problem list, medical history, surgical history, family history, social history, and previous encounter notes.  Time spent on visit including pre-visit chart review and post-visit care and charting was 20 minutes.   Coral Ceo, CMA, am acting as transcriptionist for Coralie Common, MD.  I have reviewed the above documentation for accuracy and completeness, and I agree with the above. - Coralie Common, MD

## 2021-05-14 ENCOUNTER — Ambulatory Visit: Payer: Medicaid Other

## 2021-05-14 ENCOUNTER — Telehealth: Payer: Self-pay

## 2021-05-14 NOTE — Telephone Encounter (Signed)
Unable to leave voicemail regarding missed PT appointment.

## 2021-05-15 ENCOUNTER — Ambulatory Visit (INDEPENDENT_AMBULATORY_CARE_PROVIDER_SITE_OTHER): Payer: Medicaid Other | Admitting: Obstetrics and Gynecology

## 2021-05-15 ENCOUNTER — Encounter: Payer: Self-pay | Admitting: Obstetrics and Gynecology

## 2021-05-15 ENCOUNTER — Other Ambulatory Visit: Payer: Self-pay

## 2021-05-15 DIAGNOSIS — Z30431 Encounter for routine checking of intrauterine contraceptive device: Secondary | ICD-10-CM | POA: Diagnosis not present

## 2021-05-15 NOTE — Progress Notes (Signed)
Maureen Lewis presents for IUD check Mirena IUD placed last month. Has had a cycle since insertion. Reports occ cramp  PE AF VSS Lungs clear Heart RRR Abd soft + BS obese GU Nl EGBUS, IUD string noted  A/P IUD check  Pt reassured. If cramps continue or worsen instructed pt to call and GYN U/S can be ordered. O/W F/U PRN

## 2021-05-15 NOTE — Patient Instructions (Signed)
Health Maintenance, Female Adopting a healthy lifestyle and getting preventive care are important in promoting health and wellness. Ask your health care provider about: The right schedule for you to have regular tests and exams. Things you can do on your own to prevent diseases and keep yourself healthy. What should I know about diet, weight, and exercise? Eat a healthy diet  Eat a diet that includes plenty of vegetables, fruits, low-fat dairy products, and lean protein. Do not eat a lot of foods that are high in solid fats, added sugars, or sodium.  Maintain a healthy weight Body mass index (BMI) is used to identify weight problems. It estimates body fat based on height and weight. Your health care provider can help determineyour BMI and help you achieve or maintain a healthy weight. Get regular exercise Get regular exercise. This is one of the most important things you can do for your health. Most adults should: Exercise for at least 150 minutes each week. The exercise should increase your heart rate and make you sweat (moderate-intensity exercise). Do strengthening exercises at least twice a week. This is in addition to the moderate-intensity exercise. Spend less time sitting. Even light physical activity can be beneficial. Watch cholesterol and blood lipids Have your blood tested for lipids and cholesterol at 23 years of age, then havethis test every 5 years. Have your cholesterol levels checked more often if: Your lipid or cholesterol levels are high. You are older than 23 years of age. You are at high risk for heart disease. What should I know about cancer screening? Depending on your health history and family history, you may need to have cancer screening at various ages. This may include screening for: Breast cancer. Cervical cancer. Colorectal cancer. Skin cancer. Lung cancer. What should I know about heart disease, diabetes, and high blood pressure? Blood pressure and heart  disease High blood pressure causes heart disease and increases the risk of stroke. This is more likely to develop in people who have high blood pressure readings, are of African descent, or are overweight. Have your blood pressure checked: Every 3-5 years if you are 18-39 years of age. Every year if you are 40 years old or older. Diabetes Have regular diabetes screenings. This checks your fasting blood sugar level. Have the screening done: Once every three years after age 40 if you are at a normal weight and have a low risk for diabetes. More often and at a younger age if you are overweight or have a high risk for diabetes. What should I know about preventing infection? Hepatitis B If you have a higher risk for hepatitis B, you should be screened for this virus. Talk with your health care provider to find out if you are at risk forhepatitis B infection. Hepatitis C Testing is recommended for: Everyone born from 1945 through 1965. Anyone with known risk factors for hepatitis C. Sexually transmitted infections (STIs) Get screened for STIs, including gonorrhea and chlamydia, if: You are sexually active and are younger than 24 years of age. You are older than 24 years of age and your health care provider tells you that you are at risk for this type of infection. Your sexual activity has changed since you were last screened, and you are at increased risk for chlamydia or gonorrhea. Ask your health care provider if you are at risk. Ask your health care provider about whether you are at high risk for HIV. Your health care provider may recommend a prescription medicine to help   prevent HIV infection. If you choose to take medicine to prevent HIV, you should first get tested for HIV. You should then be tested every 3 months for as long as you are taking the medicine. Pregnancy If you are about to stop having your period (premenopausal) and you may become pregnant, seek counseling before you get  pregnant. Take 400 to 800 micrograms (mcg) of folic acid every day if you become pregnant. Ask for birth control (contraception) if you want to prevent pregnancy. Osteoporosis and menopause Osteoporosis is a disease in which the bones lose minerals and strength with aging. This can result in bone fractures. If you are 65 years old or older, or if you are at risk for osteoporosis and fractures, ask your health care provider if you should: Be screened for bone loss. Take a calcium or vitamin D supplement to lower your risk of fractures. Be given hormone replacement therapy (HRT) to treat symptoms of menopause. Follow these instructions at home: Lifestyle Do not use any products that contain nicotine or tobacco, such as cigarettes, e-cigarettes, and chewing tobacco. If you need help quitting, ask your health care provider. Do not use street drugs. Do not share needles. Ask your health care provider for help if you need support or information about quitting drugs. Alcohol use Do not drink alcohol if: Your health care provider tells you not to drink. You are pregnant, may be pregnant, or are planning to become pregnant. If you drink alcohol: Limit how much you use to 0-1 drink a day. Limit intake if you are breastfeeding. Be aware of how much alcohol is in your drink. In the [place] one drink equals one 12 oz bottle of beer (355 mL), one 5 oz glass of wine (148 mL), or one 1 oz glass of hard liquor (44 mL). General instructions Schedule regular health, dental, and eye exams. Stay current with your vaccines. Tell your health care provider if: You often feel depressed. You have ever been abused or do not feel safe at home. Summary Adopting a healthy lifestyle and getting preventive care are important in promoting health and wellness. Follow your health care provider's instructions about healthy diet, exercising, and getting tested or screened for diseases. Follow your health care provider's  instructions on monitoring your cholesterol and blood pressure. This information is not intended to replace advice given to you by your health care provider. Make sure you discuss any questions you have with your healthcare provider. Document Revised: 09/29/2018 Document Reviewed: 09/29/2018 Elsevier Patient Education  2022 Elsevier Inc.  

## 2021-05-16 ENCOUNTER — Encounter: Payer: Self-pay | Admitting: Adult Health

## 2021-05-16 ENCOUNTER — Ambulatory Visit: Payer: Medicaid Other

## 2021-05-16 ENCOUNTER — Ambulatory Visit: Payer: Medicaid Other | Admitting: Adult Health

## 2021-05-16 VITALS — BP 130/92 | HR 97 | Temp 98.9°F | Wt >= 6400 oz

## 2021-05-16 DIAGNOSIS — J4541 Moderate persistent asthma with (acute) exacerbation: Secondary | ICD-10-CM

## 2021-05-16 MED ORDER — FLUTICASONE-SALMETEROL 250-50 MCG/ACT IN AEPB: 1.0000 | INHALATION_SPRAY | Freq: Two times a day (BID) | RESPIRATORY_TRACT | 3 refills | Status: DC

## 2021-05-16 NOTE — Progress Notes (Signed)
Subjective:    Patient ID: Maureen Lewis, female    DOB: Jan 13, 1996, 25 y.o.   MRN: [id number]  HPI  25 year old female who  has a past medical history of Abnormal uterine bleeding, Acid reflux, Alpha thalassemia trait, Amenorrhea, Anemia, Back pain, Cesarean delivery delivered (10/11/2019), Chest pain, Complication of anesthesia, Constipation, Depression, Fibromyalgia, Finger mass, left (03/2017), Gestational diabetes (05/23/2019), Gestational hypertension (09/23/2019), Headache, Hypertension, Infection, Irritable bowel syndrome (IBS), Joint pain, Morbid obesity with body mass index (BMI) of 45.0 to 49.9 in adult [facility]), Obesity during pregnancy, antepartum (04/06/2019), Plantar fasciitis, bilateral, Polycystic ovary syndrome, Pre-diabetes, Prediabetes, Pregnancy induced hypertension, Shortness of breath, Sleep apnea, Supervision of normal first pregnancy (04/06/2019), and Vertigo (2017).  She presents to the office today for follow-up after being seen in the emergency room 05/06/2021 and 05/09/2021 for ongoing cough and congestion congestion x2 weeks.  Her symptoms were getting worse and she was having right sided lower chest pain or shortness of breath with laying down.  This time she denied fever, chills, dizziness, abdominal pain, nausea, vomiting, or diarrhea.  During her first ER visit she was prescribed a albuterol inhaler and reported that this was not working well.  Work-up in the emergency room was overall unremarkable.  EKG showed sinus rhythm with occasional PVCs, no ST or T wave changes.  Chest x-ray negative for acute pulmonary abnormality.  It was suspected inflammatory cause of symptoms.  She was advised to continue albuterol inhaler and was given a prednisone burst as well as Best boy  Today in the office she reports that she feels better but is still short of breath with laying down and ambulation. States " t feels like something is sitting on my chest." Albuterol works  for a very short time and then symptoms return   Review of Systems See HPI   Past Medical History:  Diagnosis Date   Abnormal uterine bleeding    Acid reflux    Alpha thalassemia trait    Amenorrhea    Anemia    no current med.   Back pain    Cesarean delivery delivered 10/11/2019   10/09/2019 - primary CS for failed IOL   Chest pain    resolved   Complication of anesthesia    states had to keep giving her anesthesia during EGD   Constipation    Depression    "I'm good"   Fibromyalgia    Finger mass, left 03/2017   middle finger   Gestational diabetes 05/23/2019   hx - with pregnancy only    Gestational hypertension 09/23/2019   Guidelines for Antenatal Testing and Sonography  (with updated ICD-10 codes)  Updated  20-Sep-2019 with Dr. Tama High  INDICATION U/S 2 X week NST/AFI  or full BPP wkly DELIVERY Diabetes   A1 - good control - O24.410    A2 - good control - O24.419      A2  - poor control or poor compliance - O24.419, E11.65   (Macrosomia or polyhydramnios) **E11.65 is extra code for poor control**    A2/B - O24.   Headache    Hypertension    Infection    UTI   Irritable bowel syndrome (IBS)    Joint pain    Morbid obesity with body mass index (BMI) of 45.0 to 49.9 in adult [facility])    Obesity during pregnancy, antepartum 04/06/2019   Body mass index is 53.71 kg/m.  Recommendations '[x]'$  Aspirin 81 mg daily after 12 weeks;  discontinue after 36 weeks '[ ]'$  Nutrition consult '[ ]'$  Weight gain 11-20 lbs for singleton and 25-35 lbs for twin pregnancy (IOM guidelines) Higher class of obesity patients recommended to gain closer to lower limit  Weight loss is associated with adverse outcomes '[ ]'$  Baseline and surveillance labs (pulled in fr   Plantar fasciitis, bilateral    resolved   Polycystic ovary syndrome    Pre-diabetes    no meds, diet controlled, diet not check blood sugar   Prediabetes    "prediabetes"   Pregnancy induced hypertension    Shortness of breath    albuterol  inhaler   Sleep apnea    no CPAP use   Supervision of normal first pregnancy 04/06/2019   [name]: [ x]Initial [ x]12 '[ ]'$ 20 '[ ]'$ 28 '[ ]'$ 32 '[ ]'$ 36 '[ ]'$ 38 '[ ]'$ 39 '[ ]'$ 40 Nursing Staff Provider Office Location [facility]  Dating   LMP Language  English  Anatomy US  Nml Flu Vaccine  Declined 07/28/19 Genetic Screen  NIPS: low risks   AFP:   negative  TDaP vaccine   08/25/19 Hgb A1C or  GTT Early A1C 5.8 Third trimester: GDM insulin Rhogam  NA   LAB RESULTS  Feeding Plan Breast Blood Type   Vertigo 2017    Social History   Socioeconomic History   Marital status: Single    Spouse name: Not on file   Number of children: Not on file   Years of education: Not on file   Highest education level: Not on file  Occupational History   Occupation: Chemical engineer    Employer: [id number]  Tobacco Use   Smoking status: Never   Smokeless tobacco: Never  Vaping Use   Vaping Use: Never used  Substance and Sexual Activity   Alcohol use: Not Currently    Comment: social    Drug use: No   Sexual activity: Yes    Birth control/protection: Condom  Other Topics Concern   Not on file  Social History Narrative   Right handed    Soda sometimes   Lives with mom and cousin, a grandmother in [place] also helps care for her.       She is in nursing school.    Social Determinants of Health   Financial Resource Strain: Not on file  Food Insecurity: Not on file  Transportation Needs: Not on file  Physical Activity: Not on file  Stress: Not on file  Social Connections: Not on file  Intimate Partner Violence: Not on file    Past Surgical History:  Procedure Laterality Date   CESAREAN SECTION N/A 10/09/2019   Procedure: CESAREAN SECTION;  Surgeon: Aletha Halim, MD;  Location: [facility];  Service: Obstetrics;  Laterality: N/A;   COLONOSCOPY WITH PROPOFOL  10/07/2016   DILATION AND CURETTAGE OF UTERUS     ESOPHAGOGASTRODUODENOSCOPY  12/21/2015   EXCISION MASS UPPER EXTREMETIES Left 04/21/2017    Procedure: EXCISION MASS LEFT MIDDLE FINGER;  Surgeon: Daryll Brod, MD;  Location: [place];  Service: Orthopedics;  Laterality: Left;   IR FL GUIDED LOC OF NEEDLE/CATH TIP FOR SPINAL INJECTION RT  03/13/2020   PHOTOCOAGULATION WITH LASER Left 12/13/2020   Procedure: RETINOPLEXY LEFT EYE;  Surgeon: Sherlynn Stalls, MD;  Location: [place];  Service: Ophthalmology;  Laterality: Left;   SCLERAL BUCKLE Right 12/13/2020   Procedure: SCLERAL BUCKLE WITH CRYO;  Surgeon: Sherlynn Stalls, MD;  Location: [place];  Service: Ophthalmology;  Laterality: Right;    Family History  Problem Relation Age of Onset   Diabetes Maternal Aunt    Hypertension Maternal Uncle    Depression Maternal Grandfather    Diabetes Maternal Grandfather    Hypertension Maternal Grandfather    Arthritis Mother    High blood pressure Mother    Depression Mother    Sleep apnea Mother    Obesity Mother    Diabetes Mother    Hypertension Mother    Kidney failure Mother    Sleep apnea Father    Obesity Father    Healthy Daughter     Allergies  Allergen Reactions   Bee Pollen Hives, Shortness Of Breath and Swelling   Bee Venom Hives, Shortness Of Breath and Swelling   Hydrocodone-Acetaminophen Anaphylaxis    No problem when she takes Tylenol   Peach Flavor Hives, Shortness Of Breath and Other (See Comments)    SWELLING OF MOUTH   Pollen Extract Hives, Shortness Of Breath and Swelling   Remdesivir Swelling    Angioedema 8/25   Cortisone Itching and Swelling   Cortizone-10 [Hydrocortisone]     Swelling and itching    Current Outpatient Medications on File Prior to Visit  Medication Sig Dispense Refill   albuterol (VENTOLIN HFA) 108 (90 Base) MCG/ACT inhaler Inhale 2 puffs into the lungs every 6 (six) hours as needed for wheezing or shortness of breath. 8 g 0   amLODipine (NORVASC) 10 MG tablet Take 1 tablet (10 mg total) by mouth daily. 90 tablet 3   benzonatate (TESSALON) 200 MG capsule Take 1 capsule  (200 mg total) by mouth 3 (three) times daily as needed for cough. 20 capsule 0   diclofenac (VOLTAREN) 75 MG EC tablet Take 1 tablet (75 mg total) by mouth 2 (two) times daily. 60 tablet 1   ferrous sulfate 325 (65 FE) MG tablet Take 1 tablet (325 mg total) by mouth every other day. (Patient taking differently: Take 325 mg by mouth every Monday, Wednesday, and Friday.) 45 tablet 1   hydrochlorothiazide (HYDRODIURIL) 25 MG tablet Take 1 tablet (25 mg total) by mouth daily. 90 tablet 3   liraglutide (VICTOZA) 18 MG/3ML SOPN Inject 1.8 mg into the skin daily. 1.8 6 mL 0   loratadine (CLARITIN) 10 MG tablet Take 10 mg by mouth daily as needed for allergies.      naproxen (NAPROSYN) 500 MG tablet Take 1 tablet (500 mg total) by mouth 2 (two) times daily with a meal. 30 tablet 0   polyethylene glycol powder (GLYCOLAX/MIRALAX) 17 GM/SCOOP powder Take 17 g by mouth daily as needed for moderate constipation.      tiZANidine (ZANAFLEX) 4 MG tablet Take 1 tablet (4 mg total) by mouth every 6 (six) hours as needed for muscle spasms. 30 tablet 2   Vitamin D, Ergocalciferol, (DRISDOL) 1.25 MG (50000 UNIT) CAPS capsule Take 1 capsule (50,000 Units total) by mouth every 7 (seven) days. 4 capsule 0   No current facility-administered medications on file prior to visit.    LMP 04/18/2021 (Approximate)       Objective:   Physical Exam Vitals and nursing note reviewed.  Constitutional:      Appearance: Normal appearance. She is obese.  Cardiovascular:     Rate and Rhythm: Normal rate and regular rhythm.     Pulses: Normal pulses.     Heart sounds: Normal heart sounds.  Pulmonary:     Effort: Pulmonary effort is normal.     Breath sounds: Wheezing (trace throughout) present.  Skin:  General: Skin is warm and dry.  Neurological:     General: No focal deficit present.     Mental Status: She is alert and oriented to person, place, and time.  Psychiatric:        Mood and Affect: Mood normal.         Behavior: Behavior normal.        Thought Content: Thought content normal.        Judgment: Judgment normal.      Assessment & Plan:  1. Moderate persistent asthma with acute exacerbation - Will try her on daily maintenance inhaler and refer to pulmonary for further testing  - fluticasone-salmeterol (WIXELA INHUB) 250-50 MCG/ACT AEPB; Inhale 1 puff into the lungs in the morning and at bedtime.  Dispense: 60 each; Refill: 3 - Ambulatory referral to Pulmonology  Dorothyann Peng, NP

## 2021-05-22 ENCOUNTER — Telehealth: Payer: Self-pay

## 2021-05-22 NOTE — Telephone Encounter (Signed)
Amran Knisely Key: [id number] help? Call us at [phone] Status Sent to [place] 250-50MCG/ACT aerosol powder Form [employer] [facility] Electronic [place] Form [phone] NCPDP)

## 2021-05-27 ENCOUNTER — Telehealth: Payer: Self-pay | Admitting: Adult Health

## 2021-05-27 NOTE — Telephone Encounter (Signed)
Patient called in stating that the inhaler that was sent in for her is not covered by her insurance.  Patient is wanting another inhaler to be sent in that insurance will cover.  Please advise.

## 2021-05-29 ENCOUNTER — Encounter: Payer: Self-pay | Admitting: Adult Health

## 2021-05-29 ENCOUNTER — Other Ambulatory Visit: Payer: Self-pay | Admitting: Adult Health

## 2021-05-29 DIAGNOSIS — Z79899 Other long term (current) drug therapy: Secondary | ICD-10-CM | POA: Diagnosis not present

## 2021-05-29 DIAGNOSIS — M545 Low back pain, unspecified: Secondary | ICD-10-CM | POA: Diagnosis not present

## 2021-05-29 DIAGNOSIS — M25571 Pain in right ankle and joints of right foot: Secondary | ICD-10-CM | POA: Diagnosis not present

## 2021-05-29 DIAGNOSIS — M419 Scoliosis, unspecified: Secondary | ICD-10-CM | POA: Diagnosis not present

## 2021-05-29 DIAGNOSIS — N915 Oligomenorrhea, unspecified: Secondary | ICD-10-CM | POA: Diagnosis not present

## 2021-05-29 DIAGNOSIS — M542 Cervicalgia: Secondary | ICD-10-CM | POA: Diagnosis not present

## 2021-05-29 MED ORDER — ADVAIR HFA 115-21 MCG/ACT IN AERO
2.0000 | INHALATION_SPRAY | Freq: Two times a day (BID) | RESPIRATORY_TRACT | 3 refills | Status: DC
Start: 2021-05-29 — End: 2022-07-15

## 2021-05-29 NOTE — Telephone Encounter (Signed)
[place] Trimmer Key: [place] - PA Case ID: [id number] Need help? Call us at [phone] Outcome Approvedtoday PA Case: [id number], Status: Approved, Coverage Starts on: 05/29/2021 12:00:00 AM, Coverage Ends on: 05/29/2022 12:00:00 AM. Drug Wixela Inhub 250-50MCG/ACT aerosol powder Form [facility] [facility] Electronic [place] Form [phone] NCPDP)

## 2021-05-29 NOTE — Telephone Encounter (Signed)
Pt notified of update and verbalized understanding.  

## 2021-05-29 NOTE — Telephone Encounter (Signed)
PT called wanting to speak to a nurse in regards to the shortness of breath message that was sent this morning. Please advise.

## 2021-06-04 DIAGNOSIS — Z79899 Other long term (current) drug therapy: Secondary | ICD-10-CM | POA: Diagnosis not present

## 2021-06-07 ENCOUNTER — Emergency Department (HOSPITAL_COMMUNITY): Payer: Medicaid Other

## 2021-06-07 ENCOUNTER — Ambulatory Visit (INDEPENDENT_AMBULATORY_CARE_PROVIDER_SITE_OTHER): Payer: Medicaid Other

## 2021-06-07 ENCOUNTER — Encounter (HOSPITAL_COMMUNITY): Payer: Self-pay | Admitting: *Deleted

## 2021-06-07 ENCOUNTER — Ambulatory Visit (HOSPITAL_COMMUNITY)
Admission: EM | Admit: 2021-06-07 | Discharge: 2021-06-07 | Disposition: A | Discharge status: Home / Self Care | Payer: Medicaid Other | Attending: Medical Oncology | Admitting: Medical Oncology

## 2021-06-07 ENCOUNTER — Encounter (HOSPITAL_COMMUNITY): Payer: Self-pay

## 2021-06-07 ENCOUNTER — Other Ambulatory Visit: Payer: Self-pay

## 2021-06-07 ENCOUNTER — Emergency Department (HOSPITAL_COMMUNITY)
Admission: EM | Admit: 2021-06-07 | Discharge: 2021-06-08 | Disposition: A | Discharge status: Home / Self Care | Payer: Medicaid Other | Attending: Emergency Medicine | Admitting: Emergency Medicine

## 2021-06-07 DIAGNOSIS — Z79899 Other long term (current) drug therapy: Secondary | ICD-10-CM | POA: Insufficient documentation

## 2021-06-07 DIAGNOSIS — R079 Chest pain, unspecified: Secondary | ICD-10-CM

## 2021-06-07 DIAGNOSIS — R072 Precordial pain: Secondary | ICD-10-CM | POA: Insufficient documentation

## 2021-06-07 DIAGNOSIS — N9489 Other specified conditions associated with female genital organs and menstrual cycle: Secondary | ICD-10-CM | POA: Diagnosis not present

## 2021-06-07 DIAGNOSIS — I10 Essential (primary) hypertension: Secondary | ICD-10-CM | POA: Insufficient documentation

## 2021-06-07 DIAGNOSIS — R0602 Shortness of breath: Secondary | ICD-10-CM

## 2021-06-07 DIAGNOSIS — K219 Gastro-esophageal reflux disease without esophagitis: Secondary | ICD-10-CM | POA: Diagnosis not present

## 2021-06-07 DIAGNOSIS — I517 Cardiomegaly: Secondary | ICD-10-CM | POA: Diagnosis not present

## 2021-06-07 LAB — CBC WITH DIFFERENTIAL/PLATELET
Abs Immature Granulocytes: 0.03 10*3/uL (ref 0.00–0.07)
Basophils Absolute: 0 10*3/uL (ref 0.0–0.1)
Basophils Relative: 0 %
Eosinophils Absolute: 0 10*3/uL (ref 0.0–0.5)
Eosinophils Relative: 1 %
HCT: 42.7 % (ref 36.0–46.0)
Hemoglobin: 12.9 g/dL (ref 12.0–15.0)
Immature Granulocytes: 0 %
Lymphocytes Relative: 30 %
Lymphs Abs: 2.2 10*3/uL (ref 0.7–4.0)
MCH: 23.3 pg — ABNORMAL LOW (ref 26.0–34.0)
MCHC: 30.2 g/dL (ref 30.0–36.0)
MCV: 77.2 fL — ABNORMAL LOW (ref 80.0–100.0)
Monocytes Absolute: 0.4 10*3/uL (ref 0.1–1.0)
Monocytes Relative: 6 %
Neutro Abs: 4.6 10*3/uL (ref 1.7–7.7)
Neutrophils Relative %: 63 %
Platelets: 276 10*3/uL (ref 150–400)
RBC: 5.53 MIL/uL — ABNORMAL HIGH (ref 3.87–5.11)
RDW: 15.2 % (ref 11.5–15.5)
WBC: 7.4 10*3/uL (ref 4.0–10.5)
nRBC: 0 % (ref 0.0–0.2)

## 2021-06-07 LAB — TROPONIN I (HIGH SENSITIVITY): Troponin I (High Sensitivity): 8 ng/L (ref <18)

## 2021-06-07 LAB — HCG, SERUM, QUALITATIVE: Preg, Serum: NEGATIVE

## 2021-06-07 LAB — BRAIN NATRIURETIC PEPTIDE: B Natriuretic Peptide: 25.3 pg/mL (ref 0.0–100.0)

## 2021-06-07 LAB — D-DIMER, QUANTITATIVE: D-Dimer, Quant: 0.59 ug/mL-FEU — ABNORMAL HIGH (ref 0.00–0.50)

## 2021-06-07 LAB — BASIC METABOLIC PANEL
Anion gap: 8 (ref 5–15)
BUN: 7 mg/dL (ref 6–20)
CO2: 26 mmol/L (ref 22–32)
Calcium: 8.9 mg/dL (ref 8.9–10.3)
Chloride: 104 mmol/L (ref 98–111)
Creatinine, Ser: 0.62 mg/dL (ref 0.44–1.00)
GFR, Estimated: >60 mL/min (ref >60)
Glucose, Bld: 111 mg/dL — ABNORMAL HIGH (ref 70–99)
Potassium: 3.7 mmol/L (ref 3.5–5.1)
Sodium: 138 mmol/L (ref 135–145)

## 2021-06-07 MED ORDER — IBUPROFEN 600 MG PO TABS: 600.0000 mg | ORAL_TABLET | Freq: Four times a day (QID) | ORAL | 0 refills | Status: DC | PRN

## 2021-06-07 NOTE — ED Triage Notes (Signed)
The pt  is c/o chest pain and sob for one month  worse since last pm

## 2021-06-07 NOTE — Telephone Encounter (Signed)
Called Pulmonary on physicians line no answer. Will have to try again Monday as their office stops accepting calls after 4:30pm.

## 2021-06-07 NOTE — ED Provider Notes (Signed)
[place]    CSN: [id number] Arrival date & time: 06/07/21  1143      History   Chief Complaint Chief Complaint  Patient presents with   Chest Pain   Shortness of Breath    HPI Maureen Lewis is a 25 y.o. female.   HPI  SOB: Patient reports that for the past 2 to 3 weeks she has been having chest pain and shortness of breath.  Symptoms occur at all times but do worsen with any activity.  She notes that symptoms have been progressively worsening.  She states that the shortness of breath feels like her asthma but is not well controlled.  Significantly worse if she lies back.  She reports the chest pain runs from the top of her chest down to her bottom of her rib cage.  She denies any cough, fever, peripheral edema, recent changes in weight.  She does have an IUD, no recent hospital procedures, long travel, history of blood clots.   Past Medical History:  Diagnosis Date   Abnormal uterine bleeding    Acid reflux    Alpha thalassemia trait    Amenorrhea    Anemia    no current med.   Back pain    Cesarean delivery delivered 10/11/2019   10/09/2019 - primary CS for failed IOL   Chest pain    resolved   Complication of anesthesia    states had to keep giving her anesthesia during EGD   Constipation    Depression    "I'm good"   Fibromyalgia    Finger mass, left 03/2017   middle finger   Gestational diabetes 05/23/2019   hx - with pregnancy only    Gestational hypertension 09/23/2019   Guidelines for Antenatal Testing and Sonography  (with updated ICD-10 codes)  Updated  09/21/2019 with Dr. Tama High  INDICATION U/S 2 X week NST/AFI  or full BPP wkly DELIVERY Diabetes   A1 - good control - O24.410    A2 - good control - O24.419      A2  - poor control or poor compliance - O24.419, E11.65   (Macrosomia or polyhydramnios) **E11.65 is extra code for poor control**    A2/B - O24.   Headache    Hypertension    Infection    UTI   Irritable bowel  syndrome (IBS)    Joint pain    Morbid obesity with body mass index (BMI) of 45.0 to 49.9 in adult [facility])    Obesity during pregnancy, antepartum 04/06/2019   Body mass index is 53.71 kg/m.  Recommendations '[x]'$  Aspirin 81 mg daily after 12 weeks; discontinue after 36 weeks '[ ]'$  Nutrition consult '[ ]'$  Weight gain 11-20 lbs for singleton and 25-35 lbs for twin pregnancy (IOM guidelines) Higher class of obesity patients recommended to gain closer to lower limit  Weight loss is associated with adverse outcomes '[ ]'$  Baseline and surveillance labs (pulled in fr   Plantar fasciitis, bilateral    resolved   Polycystic ovary syndrome    Pre-diabetes    no meds, diet controlled, diet not check blood sugar   Prediabetes    "prediabetes"   Pregnancy induced hypertension    Shortness of breath    albuterol inhaler   Sleep apnea    no CPAP use   Supervision of normal first pregnancy 04/06/2019   [name]: [ x]Initial [ x]12 '[ ]'$ 20 '[ ]'$ 28 '[ ]'$ 32 '[ ]'$ 36 '[ ]'$ 38 '[ ]'$ 39 '[ ]'$   74 Nursing Staff Provider Office Location [facility]  Dating   LMP Language  English  Anatomy US  Nml Flu Vaccine  Declined 07/28/19 Genetic Screen  NIPS: low risks   AFP:   negative  TDaP vaccine   08/25/19 Hgb A1C or  GTT Early A1C 5.8 Third trimester: GDM insulin Rhogam  NA   LAB RESULTS  Feeding Plan Breast Blood Type   Vertigo 2017    Patient Active Problem List   Diagnosis Date Noted   IUD check up 05/15/2021   Encounter for insertion of mirena IUD 04/01/2021   Levator spasm 11/21/2020   Galactorrhea 11/19/2020   Hx of migraine headaches 04/09/2020   Contraceptive management 11/22/2019   Positive RPR test 10/09/2019   HTN (hypertension) 09/23/2019   Migraine headache 07/12/2019   Alpha thalassemia trait    Vitamin D deficiency 09/16/2017   Prediabetes 09/16/2017   Depression 09/16/2017   Class 3 severe obesity with serious comorbidity and body mass index (BMI) of 50.0 to 59.9 in adult ([place]) 09/16/2017   PCOS (polycystic  ovarian syndrome) 08/13/2017   Mass 05/06/2017   Vertigo 10/06/2016   Microcytic anemia 09/26/2016   Pseudotumor cerebri syndrome 08/28/2016   Obesity hypoventilation syndrome ([place]) 08/28/2016   Benign paroxysmal positional vertigo due to bilateral vestibular disorder 08/28/2016   OSA (obstructive sleep apnea) 08/28/2016   Hypersomnia with sleep apnea 08/28/2016   Female hirsutism 08/28/2016   GERD (gastroesophageal reflux disease) 02/24/2015   Constipation 10/02/2014    Past Surgical History:  Procedure Laterality Date   CESAREAN SECTION N/A 10/09/2019   Procedure: CESAREAN SECTION;  Surgeon: Aletha Halim, MD;  [facility];  Service: Obstetrics;  Laterality: N/A;   COLONOSCOPY WITH PROPOFOL  10/07/2016   DILATION AND CURETTAGE OF UTERUS     ESOPHAGOGASTRODUODENOSCOPY  12/21/2015   EXCISION MASS UPPER EXTREMETIES Left 04/21/2017   Procedure: EXCISION MASS LEFT MIDDLE FINGER;  Surgeon: Daryll Brod, MD;  Location: [place];  Service: Orthopedics;  Laterality: Left;   IR FL GUIDED LOC OF NEEDLE/CATH TIP FOR SPINAL INJECTION RT  03/13/2020   PHOTOCOAGULATION WITH LASER Left 12/13/2020   Procedure: RETINOPLEXY LEFT EYE;  Surgeon: Sherlynn Stalls, MD;  Location: [place];  Service: Ophthalmology;  Laterality: Left;   SCLERAL BUCKLE Right 12/13/2020   Procedure: SCLERAL BUCKLE WITH CRYO;  Surgeon: Sherlynn Stalls, MD;  Location: [place];  Service: Ophthalmology;  Laterality: Right;    OB History     Gravida  1   Para  1   Term  1   Preterm  0   AB  0   Living  1      SAB  0   IAB  0   Ectopic  0   Multiple  0   Live Births  1            Home Medications    Prior to Admission medications   Medication Sig Start Date End Date Taking? Authorizing Provider  albuterol (VENTOLIN HFA) 108 (90 Base) MCG/ACT inhaler Inhale 2 puffs into the lungs every 6 (six) hours as needed for wheezing or shortness of breath. 05/06/21   Ward, Lenise Arena, PA-C   amLODipine (NORVASC) 10 MG tablet Take 1 tablet (10 mg total) by mouth daily. 11/08/20   Nafziger, Tommi Rumps, NP  benzonatate (TESSALON) 200 MG capsule Take 1 capsule (200 mg total) by mouth 3 (three) times daily as needed for cough. 05/09/21   Volney American, PA-C  diclofenac (  VOLTAREN) 75 MG EC tablet Take 1 tablet (75 mg total) by mouth 2 (two) times daily. 07/11/20   Hudnall, Sharyn Lull, MD  ferrous sulfate 325 (65 FE) MG tablet Take 1 tablet (325 mg total) by mouth every other day. Patient taking differently: Take 325 mg by mouth every Monday, Wednesday, and Friday. 02/23/20 08/27/20  Nafziger, Tommi Rumps, NP  fluticasone-salmeterol (ADVAIR HFA) [id number] MCG/ACT inhaler Inhale 2 puffs into the lungs 2 (two) times daily. 05/29/21   Nafziger, Tommi Rumps, NP  hydrochlorothiazide (HYDRODIURIL) 25 MG tablet Take 1 tablet (25 mg total) by mouth daily. 11/08/20   Nafziger, Tommi Rumps, NP  liraglutide (VICTOZA) 18 MG/3ML SOPN Inject 1.8 mg into the skin daily. 1.8 05/06/21   Laqueta Linden, MD  loratadine (CLARITIN) 10 MG tablet Take 10 mg by mouth daily as needed for allergies.     [provider]  naproxen (NAPROSYN) 500 MG tablet Take 1 tablet (500 mg total) by mouth 2 (two) times daily with a meal. 03/29/21   Jaynee Eagles, PA-C  polyethylene glycol powder (GLYCOLAX/MIRALAX) 17 GM/SCOOP powder Take 17 g by mouth daily as needed for moderate constipation.  10/11/19   [provider]  tiZANidine (ZANAFLEX) 4 MG tablet Take 1 tablet (4 mg total) by mouth every 6 (six) hours as needed for muscle spasms. 02/14/21   Gregor Hams, MD  Vitamin D, Ergocalciferol, (DRISDOL) 1.25 MG (50000 UNIT) CAPS capsule Take 1 capsule (50,000 Units total) by mouth every 7 (seven) days. 05/06/21   Laqueta Linden, MD    Family History Family History  Problem Relation Age of Onset   Diabetes Maternal Aunt    Hypertension Maternal Uncle    Depression Maternal Grandfather    Diabetes Maternal Grandfather    Hypertension  Maternal Grandfather    Arthritis Mother    High blood pressure Mother    Depression Mother    Sleep apnea Mother    Obesity Mother    Diabetes Mother    Hypertension Mother    Kidney failure Mother    Sleep apnea Father    Obesity Father    Healthy Daughter     Social History Social History   Tobacco Use   Smoking status: Never   Smokeless tobacco: Never  Vaping Use   Vaping Use: Never used  Substance Use Topics   Alcohol use: Not Currently    Comment: social    Drug use: No     Allergies   Bee pollen, Bee venom, Hydrocodone-acetaminophen, Peach flavor, Pollen extract, Remdesivir, Cortisone, and Cortizone-10 [hydrocortisone]   Review of Systems Review of Systems  As stated above in HPI Physical Exam Triage Vital Signs ED Triage Vitals  Enc Vitals Group     BP 06/07/21 1251 133/88     Pulse Rate 06/07/21 1251 95     Resp 06/07/21 1251 19     Temp 06/07/21 1251 98.1 F (36.7 C)     Temp Source 06/07/21 1251 Oral     SpO2 06/07/21 1251 97 %     Weight --      Height --      Head Circumference --      Peak Flow --      Pain Score 06/07/21 1249 8     Pain Loc --      Pain Edu? --      Excl. in [place]? --    No data found.  Updated Vital Signs BP 133/88 (BP Location: Right Arm)  Pulse 95   Temp 98.1 F (36.7 C) (Oral)   Resp 19   LMP 05/30/2021 (Approximate)   SpO2 97%   Physical Exam Vitals and nursing note reviewed.  Constitutional:      General: She is not in acute distress.    Appearance: She is well-developed. She is obese. She is not ill-appearing, toxic-appearing or diaphoretic.  HENT:     Head: Normocephalic and atraumatic.     Mouth/Throat:     Pharynx: Oropharynx is clear.     Comments: FTP4 Neck:     Vascular: No JVD.  Cardiovascular:     Rate and Rhythm: Normal rate and regular rhythm.     Heart sounds: Normal heart sounds.  Pulmonary:     Effort: Pulmonary effort is normal.     Breath sounds: No decreased breath sounds,  wheezing, rhonchi or rales.  Chest:     Chest wall: No mass, deformity, tenderness, crepitus or edema. There is no dullness to percussion.  Musculoskeletal:     Cervical back: Neck supple.     Right lower leg: No edema.     Left lower leg: No edema.  Skin:    General: Skin is warm.     Capillary Refill: Capillary refill takes less than 2 seconds.     Coloration: Skin is not cyanotic or pale.     Nails: There is no clubbing.  Neurological:     Mental Status: She is alert and oriented to person, place, and time.     Motor: No weakness.     UC Treatments / Results  Labs (all labs ordered are listed, but only abnormal results are displayed) Labs Reviewed - No data to display  EKG   Radiology No results found.  Procedures Procedures (including critical care time)  Medications Ordered in UC Medications - No data to display  Initial Impression / Assessment and Plan / UC Course  I have reviewed the triage vital signs and the nursing notes.  Pertinent labs & imaging results that were available during my care of the patient were reviewed by me and considered in my medical decision making (see chart for details).     New. Vitals are reassuring.  Chest x-ray is nonacute in nature.  EKG shows PVCs. Patient rates symptoms as 8.5/10. For this reason I am referring her to the emergency room for further evaluation and work up for potential pulmonary embolism vs HCF vs other. She elects private vehicle transport across our parking lot.  Final Clinical Impressions(s) / UC Diagnoses   Final diagnoses:  None   Discharge Instructions   None    ED Prescriptions   None    PDMP not reviewed this encounter.   Hughie Closs, [place] 06/07/21 1519

## 2021-06-07 NOTE — ED Provider Notes (Signed)
[facility] EMERGENCY DEPARTMENT Provider Note   CSN: [id number] Arrival date & time: 06/07/21  1558     History Chief Complaint  Patient presents with   Chest Pain    Maureen Lewis is a 25 y.o. female.  Patient is a 25 yo female presenting for chest pain. Patient admits to chest pain and shortness of breath x several months. States symptoms are intermittent, worse with exertion. Admits to hx of asthma but denies wheezing or feeling similar to previous exacerbations. Denies fevers, chills, nausea, vomiting, sore throat, or coughing. Denies hx of DVT/PE, new leg swelling/calf pain, recent surgeries, or recent immobilization. Admits to hormone use-birth control.   The history is provided by the patient. No language interpreter was used.  Chest Pain Pain location:  Substernal area Pain quality: aching   Pain radiates to:  Does not radiate Pain severity:  Mild     Past Medical History:  Diagnosis Date   Abnormal uterine bleeding    Acid reflux    Alpha thalassemia trait    Amenorrhea    Anemia    no current med.   Back pain    Cesarean delivery delivered 10/11/2019   10/09/2019 - primary CS for failed IOL   Chest pain    resolved   Complication of anesthesia    states had to keep giving her anesthesia during EGD   Constipation    Depression    "I'm good"   Fibromyalgia    Finger mass, left 03/2017   middle finger   Gestational diabetes 05/23/2019   hx - with pregnancy only    Gestational hypertension 09/23/2019   Guidelines for Antenatal Testing and Sonography  (with updated ICD-10 codes)  Updated  19-Sep-2019 with Dr. Tama High  INDICATION U/S 2 X week NST/AFI  or full BPP wkly DELIVERY Diabetes   A1 - good control - O24.410    A2 - good control - O24.419      A2  - poor control or poor compliance - O24.419, E11.65   (Macrosomia or polyhydramnios) **E11.65 is extra code for poor control**    A2/B - O24.   Headache    Hypertension     Infection    UTI   Irritable bowel syndrome (IBS)    Joint pain    Morbid obesity with body mass index (BMI) of 45.0 to 49.9 in adult [facility])    Obesity during pregnancy, antepartum 04/06/2019   Body mass index is 53.71 kg/m.  Recommendations '[x]'$  Aspirin 81 mg daily after 12 weeks; discontinue after 36 weeks '[ ]'$  Nutrition consult '[ ]'$  Weight gain 11-20 lbs for singleton and 25-35 lbs for twin pregnancy (IOM guidelines) Higher class of obesity patients recommended to gain closer to lower limit  Weight loss is associated with adverse outcomes '[ ]'$  Baseline and surveillance labs (pulled in fr   Plantar fasciitis, bilateral    resolved   Polycystic ovary syndrome    Pre-diabetes    no meds, diet controlled, diet not check blood sugar   Prediabetes    "prediabetes"   Pregnancy induced hypertension    Shortness of breath    albuterol inhaler   Sleep apnea    no CPAP use   Supervision of normal first pregnancy 04/06/2019   [name]: [ x]Initial [ x]12 '[ ]'$ 20 '[ ]'$ 28 '[ ]'$ 32 '[ ]'$ 36 '[ ]'$ 38 '[ ]'$ 39 '[ ]'$ 40 Nursing Staff Provider Office Location [facility]  Dating   LMP Language  English  Anatomy US  Nml Flu Vaccine  Declined 07/28/19 Genetic Screen  NIPS: low risks   AFP:   negative  TDaP vaccine   08/25/19 Hgb A1C or  GTT Early A1C 5.8 Third trimester: GDM insulin Rhogam  NA   LAB RESULTS  Feeding Plan Breast Blood Type   Vertigo 2017    Patient Active Problem List   Diagnosis Date Noted   IUD check up 05/15/2021   Encounter for insertion of mirena IUD 04/01/2021   Levator spasm 11/21/2020   Galactorrhea 11/19/2020   Hx of migraine headaches 04/09/2020   Contraceptive management 11/22/2019   Positive RPR test 10/09/2019   HTN (hypertension) 09/23/2019   Migraine headache 07/12/2019   Alpha thalassemia trait    Vitamin D deficiency 09/16/2017   Prediabetes 09/16/2017   Depression 09/16/2017   Class 3 severe obesity with serious comorbidity and body mass index (BMI) of 50.0 to 59.9 in adult  ([place]) 09/16/2017   PCOS (polycystic ovarian syndrome) 08/13/2017   Mass 05/06/2017   Vertigo 10/06/2016   Microcytic anemia 09/26/2016   Pseudotumor cerebri syndrome 08/28/2016   Obesity hypoventilation syndrome ([place]) 08/28/2016   Benign paroxysmal positional vertigo due to bilateral vestibular disorder 08/28/2016   OSA (obstructive sleep apnea) 08/28/2016   Hypersomnia with sleep apnea 08/28/2016   Female hirsutism 08/28/2016   GERD (gastroesophageal reflux disease) 02/24/2015   Constipation 10/02/2014    Past Surgical History:  Procedure Laterality Date   CESAREAN SECTION N/A 10/09/2019   Procedure: CESAREAN SECTION;  Surgeon: Aletha Halim, MD;  [facility];  Service: Obstetrics;  Laterality: N/A;   COLONOSCOPY WITH PROPOFOL  10/07/2016   DILATION AND CURETTAGE OF UTERUS     ESOPHAGOGASTRODUODENOSCOPY  12/21/2015   EXCISION MASS UPPER EXTREMETIES Left 04/21/2017   Procedure: EXCISION MASS LEFT MIDDLE FINGER;  Surgeon: Daryll Brod, MD;  Location: [place];  Service: Orthopedics;  Laterality: Left;   IR FL GUIDED LOC OF NEEDLE/CATH TIP FOR SPINAL INJECTION RT  03/13/2020   PHOTOCOAGULATION WITH LASER Left 12/13/2020   Procedure: RETINOPLEXY LEFT EYE;  Surgeon: Sherlynn Stalls, MD;  Location: [place];  Service: Ophthalmology;  Laterality: Left;   SCLERAL BUCKLE Right 12/13/2020   Procedure: SCLERAL BUCKLE WITH CRYO;  Surgeon: Sherlynn Stalls, MD;  Location: [place];  Service: Ophthalmology;  Laterality: Right;     OB History     Gravida  1   Para  1   Term  1   Preterm  0   AB  0   Living  1      SAB  0   IAB  0   Ectopic  0   Multiple  0   Live Births  1           Family History  Problem Relation Age of Onset   Diabetes Maternal Aunt    Hypertension Maternal Uncle    Depression Maternal Grandfather    Diabetes Maternal Grandfather    Hypertension Maternal Grandfather    Arthritis Mother    High blood pressure Mother     Depression Mother    Sleep apnea Mother    Obesity Mother    Diabetes Mother    Hypertension Mother    Kidney failure Mother    Sleep apnea Father    Obesity Father    Healthy Daughter     Social History   Tobacco Use   Smoking status: Never   Smokeless tobacco: Never  Vaping Use  Vaping Use: Never used  Substance Use Topics   Alcohol use: Not Currently    Comment: social    Drug use: No    Home Medications Prior to Admission medications   Medication Sig Start Date End Date Taking? Authorizing Provider  albuterol (VENTOLIN HFA) 108 (90 Base) MCG/ACT inhaler Inhale 2 puffs into the lungs every 6 (six) hours as needed for wheezing or shortness of breath. 05/06/21   Ward, Lenise Arena, PA-C  amLODipine (NORVASC) 10 MG tablet Take 1 tablet (10 mg total) by mouth daily. 11/08/20   Nafziger, Tommi Rumps, NP  benzonatate (TESSALON) 200 MG capsule Take 1 capsule (200 mg total) by mouth 3 (three) times daily as needed for cough. 05/09/21   Volney American, PA-C  diclofenac (VOLTAREN) 75 MG EC tablet Take 1 tablet (75 mg total) by mouth 2 (two) times daily. 07/11/20   Hudnall, Sharyn Lull, MD  ferrous sulfate 325 (65 FE) MG tablet Take 1 tablet (325 mg total) by mouth every other day. Patient taking differently: Take 325 mg by mouth every Monday, Wednesday, and Friday. 02/23/20 08/27/20  Nafziger, Tommi Rumps, NP  fluticasone-salmeterol (ADVAIR HFA) [id number] MCG/ACT inhaler Inhale 2 puffs into the lungs 2 (two) times daily. 05/29/21   Nafziger, Tommi Rumps, NP  hydrochlorothiazide (HYDRODIURIL) 25 MG tablet Take 1 tablet (25 mg total) by mouth daily. 11/08/20   Nafziger, Tommi Rumps, NP  liraglutide (VICTOZA) 18 MG/3ML SOPN Inject 1.8 mg into the skin daily. 1.8 05/06/21   Laqueta Linden, MD  loratadine (CLARITIN) 10 MG tablet Take 10 mg by mouth daily as needed for allergies.     [provider]  naproxen (NAPROSYN) 500 MG tablet Take 1 tablet (500 mg total) by mouth 2 (two) times daily with a meal. 03/29/21    Jaynee Eagles, PA-C  polyethylene glycol powder (GLYCOLAX/MIRALAX) 17 GM/SCOOP powder Take 17 g by mouth daily as needed for moderate constipation.  10/11/19   [provider]  tiZANidine (ZANAFLEX) 4 MG tablet Take 1 tablet (4 mg total) by mouth every 6 (six) hours as needed for muscle spasms. 02/14/21   Gregor Hams, MD  Vitamin D, Ergocalciferol, (DRISDOL) 1.25 MG (50000 UNIT) CAPS capsule Take 1 capsule (50,000 Units total) by mouth every 7 (seven) days. 05/06/21   Laqueta Linden, MD    Allergies    Bee pollen, Bee venom, Hydrocodone-acetaminophen, Peach flavor, Pollen extract, Remdesivir, Cortisone, and Cortizone-10 [hydrocortisone]  Review of Systems   Review of Systems  HENT:  Negative for ear pain and sore throat.   Eyes:  Negative for pain and visual disturbance.  Cardiovascular:  Positive for chest pain.  Genitourinary:  Negative for dysuria and hematuria.  Musculoskeletal:  Negative for arthralgias.  Skin:  Negative for color change and rash.  All other systems reviewed and are negative.  Physical Exam Updated Vital Signs BP 138/65   Pulse 90   Temp 98.8 F (37.1 C) (Oral)   Resp 19   Ht 6' 2.5" (1.892 m)   Wt (!) 204.1 kg   LMP 05/30/2021 (Approximate)   SpO2 99%   BMI 57.00 kg/m   Physical Exam Vitals and nursing note reviewed.  Constitutional:      General: She is not in acute distress.    Appearance: She is well-developed. She is morbidly obese.  HENT:     Head: Normocephalic and atraumatic.  Eyes:     Conjunctiva/sclera: Conjunctivae normal.  Cardiovascular:     Rate and Rhythm: Normal rate and regular  rhythm.     Heart sounds: No murmur heard. Pulmonary:     Effort: Pulmonary effort is normal. No respiratory distress.     Breath sounds: Normal breath sounds.  Abdominal:     Palpations: Abdomen is soft.     Tenderness: There is no abdominal tenderness.  Musculoskeletal:     Cervical back: Neck supple.  Skin:    General: Skin is warm  and dry.  Neurological:     Mental Status: She is alert.    ED Results / Procedures / Treatments   Labs (all labs ordered are listed, but only abnormal results are displayed) Labs Reviewed  BASIC METABOLIC PANEL - Abnormal; Notable for the following components:      Result Value   Glucose, Bld 111 (*)    All other components within normal limits  CBC WITH DIFFERENTIAL/PLATELET - Abnormal; Notable for the following components:   RBC 5.53 (*)    MCV 77.2 (*)    MCH 23.3 (*)    All other components within normal limits  BRAIN NATRIURETIC PEPTIDE  HCG, SERUM, QUALITATIVE  D-DIMER, QUANTITATIVE  TROPONIN I (HIGH SENSITIVITY)    EKG None  Radiology DG Chest 2 View  Result Date: 06/07/2021 CLINICAL DATA:  Chest pain, shortness of breath EXAM: CHEST - 2 VIEW COMPARISON:  05/09/2021, chest CT 06/11/2020 FINDINGS: Unchanged cardiomediastinal silhouette. There is no new focal airspace disease. There is no large pleural effusion or visible pneumothorax. There is no acute osseous abnormality. IMPRESSION: No evidence of acute cardiopulmonary disease. Electronically Signed   By: Maurine Simmering M.D.   On: 06/07/2021 14:36    Procedures Procedures   Medications Ordered in ED Medications - No data to display  ED Course  I have reviewed the triage vital signs and the nursing notes.  Pertinent labs & imaging results that were available during my care of the patient were reviewed by me and considered in my medical decision making (see chart for details).    MDM Rules/Calculators/A&P                         8:34 PM 25 yo female presenting for chest pain. Patient is Aox3, no acute distress, afebrile, with stable vitals. Physical exam demonstrates equal bilateral breath sounds with no adventitious lung sounds.   The patient's chest pain is not suggestive of pulmonary embolus, cardiac ischemia, aortic dissection, pericarditis, myocarditis, pulmonary embolism, pneumothorax, pneumonia, Zoster, or  esophageal perforation, or other serious etiology.  Historically not abrupt in onset, tearing or ripping, pulses symmetric. EKG nonspecific for ischemia/infarction. No dysrhythmias, brugada, WPW, prolonged QT noted. Troponin negative x2. CXR reviewed. Labs without demonstration of acute pathology unless otherwise noted above. D-dimer minimally elevated, finding dicussed with patient with decline for CT PE.    Patient in no distress and overall condition improved here in the ED. Detailed discussions were had with the patient regarding current findings, and need for close f/u with PCP or on call doctor. The patient has been instructed to return immediately if the symptoms worsen in any way for re-evaluation. Patient verbalized understanding and is in agreement with current care plan. All questions answered prior to discharge.         Final Clinical Impression(s) / ED Diagnoses Final diagnoses:  Chest pain, unspecified type    Rx / DC Orders ED Discharge Orders     None        Lianne Cure, DO [id number]

## 2021-06-07 NOTE — ED Provider Notes (Signed)
IsEmergency Medicine Provider Triage Evaluation Note  Maureen Lewis , a 25 y.o. female  was evaluated in triage.  Pt complains of chest pain and shortness of breath for numerous months now.  She felt more short of breath last night.  She states that she went to the urgent care who referred her to the ER.  She is morbidly obese with history of hypertension and gestational diabetes but has no history of coronary artery disease.  However.  Denies any recreational drug use.  No recent surgeries, hospitalization, long travel, hemoptysis, estrogen containing OCP, cancer history.  No unilateral leg swelling that is new--patient has chronic swelling of right ankle from fracture years ago.  No history of PE or VTE.   Review of Systems  Positive: Short of breath, chest pain Negative: Fever  Physical Exam  BP (!) 156/76 (BP Location: Right Arm)   Pulse 80   Temp 98.8 F (37.1 C) (Oral)   Resp (!) 24   Ht 6' 2.5" (1.892 m)   Wt (!) 204.1 kg   LMP 05/30/2021 (Approximate)   SpO2 93%   BMI 57.00 kg/m  Gen:   Awake, no distress morbidly obese Resp:  Normal effort  MSK:   Moves extremities without difficulty  Other:  Lower extremities without edema or tenderness  Medical Decision Making  Medically screening exam initiated at 4:25 PM.  Appropriate orders placed.  Maureen Lewis was informed that the remainder of the evaluation will be completed by another provider, this initial triage assessment does not replace that evaluation, and the importance of remaining in the ED until their evaluation is complete.  Well-appearing 25 year old female states that she has been short of breath for many months.  Has a referral to pulmonology from her [facility] PCP however went to urgent care who referred her to the ER for further work-up.   Maureen Lewis, [place] 06/07/21 1628    Arnaldo Natal, MD 06/07/21 [phone]

## 2021-06-07 NOTE — ED Triage Notes (Signed)
Pt presents with SOB and chest pain x 1 week.   States she has felt worse since last night. Denies cough.

## 2021-06-09 ENCOUNTER — Other Ambulatory Visit: Payer: Self-pay

## 2021-06-09 ENCOUNTER — Emergency Department (HOSPITAL_COMMUNITY): Payer: Medicaid Other

## 2021-06-09 ENCOUNTER — Encounter (HOSPITAL_COMMUNITY): Payer: Self-pay | Admitting: Oncology

## 2021-06-09 ENCOUNTER — Emergency Department (HOSPITAL_COMMUNITY)
Admission: EM | Admit: 2021-06-09 | Discharge: 2021-06-09 | Disposition: A | Discharge status: Home / Self Care | Payer: Medicaid Other | Attending: Emergency Medicine | Admitting: Emergency Medicine

## 2021-06-09 DIAGNOSIS — I7789 Other specified disorders of arteries and arterioles: Secondary | ICD-10-CM | POA: Diagnosis not present

## 2021-06-09 DIAGNOSIS — R062 Wheezing: Secondary | ICD-10-CM | POA: Diagnosis not present

## 2021-06-09 DIAGNOSIS — R0602 Shortness of breath: Secondary | ICD-10-CM | POA: Diagnosis not present

## 2021-06-09 DIAGNOSIS — Z79899 Other long term (current) drug therapy: Secondary | ICD-10-CM | POA: Insufficient documentation

## 2021-06-09 DIAGNOSIS — R918 Other nonspecific abnormal finding of lung field: Secondary | ICD-10-CM | POA: Diagnosis not present

## 2021-06-09 DIAGNOSIS — Z0389 Encounter for observation for other suspected diseases and conditions ruled out: Secondary | ICD-10-CM | POA: Diagnosis not present

## 2021-06-09 DIAGNOSIS — R0789 Other chest pain: Secondary | ICD-10-CM | POA: Diagnosis not present

## 2021-06-09 DIAGNOSIS — I10 Essential (primary) hypertension: Secondary | ICD-10-CM | POA: Insufficient documentation

## 2021-06-09 HISTORY — DX: Unspecified asthma, uncomplicated: J45.909

## 2021-06-09 LAB — CBC WITH DIFFERENTIAL/PLATELET
Abs Immature Granulocytes: 0.01 10*3/uL (ref 0.00–0.07)
Basophils Absolute: 0 10*3/uL (ref 0.0–0.1)
Basophils Relative: 1 %
Eosinophils Absolute: 0.1 10*3/uL (ref 0.0–0.5)
Eosinophils Relative: 1 %
HCT: 42.4 % (ref 36.0–46.0)
Hemoglobin: 13.1 g/dL (ref 12.0–15.0)
Immature Granulocytes: 0 %
Lymphocytes Relative: 28 %
Lymphs Abs: 1.8 10*3/uL (ref 0.7–4.0)
MCH: 23.6 pg — ABNORMAL LOW (ref 26.0–34.0)
MCHC: 30.9 g/dL (ref 30.0–36.0)
MCV: 76.3 fL — ABNORMAL LOW (ref 80.0–100.0)
Monocytes Absolute: 0.3 10*3/uL (ref 0.1–1.0)
Monocytes Relative: 5 %
Neutro Abs: 4.2 10*3/uL (ref 1.7–7.7)
Neutrophils Relative %: 65 %
Platelets: 275 10*3/uL (ref 150–400)
RBC: 5.56 MIL/uL — ABNORMAL HIGH (ref 3.87–5.11)
RDW: 15.1 % (ref 11.5–15.5)
WBC: 6.4 10*3/uL (ref 4.0–10.5)
nRBC: 0 % (ref 0.0–0.2)

## 2021-06-09 LAB — BASIC METABOLIC PANEL
Anion gap: 7 (ref 5–15)
BUN: 12 mg/dL (ref 6–20)
CO2: 28 mmol/L (ref 22–32)
Calcium: 9.6 mg/dL (ref 8.9–10.3)
Chloride: 105 mmol/L (ref 98–111)
Creatinine, Ser: 0.67 mg/dL (ref 0.44–1.00)
GFR, Estimated: >60 mL/min (ref >60)
Glucose, Bld: 109 mg/dL — ABNORMAL HIGH (ref 70–99)
Potassium: 4.7 mmol/L (ref 3.5–5.1)
Sodium: 140 mmol/L (ref 135–145)

## 2021-06-09 LAB — TROPONIN I (HIGH SENSITIVITY): Troponin I (High Sensitivity): 2 ng/L (ref <18)

## 2021-06-09 LAB — I-STAT BETA HCG BLOOD, ED (MC, WL, AP ONLY): I-stat hCG, quantitative: <5 m[IU]/mL (ref <5)

## 2021-06-09 MED ORDER — IOHEXOL 350 MG/ML SOLN
100.0000 mL | Freq: Once | INTRAVENOUS | Status: AC | PRN
  Administered 2021-06-09: 100 mL via INTRAVENOUS

## 2021-06-09 NOTE — ED Notes (Signed)
Patient transported to CT 

## 2021-06-09 NOTE — ED Notes (Signed)
Patient back from CT.

## 2021-06-09 NOTE — Discharge Instructions (Addendum)
Call your primary care provider to let them know you are seen here in the emergency department.  Your CT scan did show pulmonary hypertension.  This is best followed by pulmonology.  I have placed a referral in the computer to see if he can get in sooner for the pulmonologist.  If you have underlying sleep apnea and or not using a CPAP machine this may cause this.   Keep close follow-up with pulmonary as well as her primary care provider  Return for new or worsening symptoms

## 2021-06-09 NOTE — ED Provider Notes (Signed)
[facility] DEPT Provider Note   CSN: [id number] Arrival date & time: 06/09/21  0906    History Chief Complaint  Patient presents with   Chest Pain    Maureen Lewis is a 25 y.o. female with medical history significant for anemia, recurrent chest pain, fibromyalgia, sleep apnea does not use CPAP presents for evaluation of shortness of breath and chest tightness.  This has been an ongoing issue however worse over the last week.  Experiences shortness of breath with minimal movement.  Has chronic, constant chest tightness x months, not necessarily exertional in nature..  Was referred to pulm by PCP.  She has been evaluated for this 6 times over the last month between urgent care, ED visits and PCP.  Unfortunately has not been able to find answers as to what is causing this.  Does have history of asthma however does not have significant wheeze on exam.  Was seen in the ED 3 days ago, had elevated D-dimer.  CTA not performed at that time, due to patient declining.  Subsequently discharged home.  She finished a steroid pack approximately 3 weeks ago which she states did not significantly help her symptoms.  No recent surgery, mobilization, lower extremity swelling.  Does use hormonal contraception.  Denies additional aggravating or alleviating factors.  No fever, known recent exposures to COVID patients.  History obtained from patient and past medical records.  No interpreter used  HPI     Past Medical History:  Diagnosis Date   Abnormal uterine bleeding    Acid reflux    Alpha thalassemia trait    Amenorrhea    Anemia    no current med.   Asthma    Back pain    Cesarean delivery delivered 10/11/2019   10/09/2019 - primary CS for failed IOL   Chest pain    resolved   Complication of anesthesia    states had to keep giving her anesthesia during EGD   Constipation    Depression    "I'm good"   Fibromyalgia    Finger mass, left 03/2017    middle finger   Gestational diabetes 05/23/2019   hx - with pregnancy only    Gestational hypertension 09/23/2019   Guidelines for Antenatal Testing and Sonography  (with updated ICD-10 codes)  Updated  21-Sep-2019 with Dr. Tama High  INDICATION U/S 2 X week NST/AFI  or full BPP wkly DELIVERY Diabetes   A1 - good control - O24.410    A2 - good control - O24.419      A2  - poor control or poor compliance - O24.419, E11.65   (Macrosomia or polyhydramnios) **E11.65 is extra code for poor control**    A2/B - O24.   Headache    Hypertension    Infection    UTI   Irritable bowel syndrome (IBS)    Joint pain    Morbid obesity with body mass index (BMI) of 45.0 to 49.9 in adult [facility])    Obesity during pregnancy, antepartum 04/06/2019   Body mass index is 53.71 kg/m.  Recommendations '[x]'$  Aspirin 81 mg daily after 12 weeks; discontinue after 36 weeks '[ ]'$  Nutrition consult '[ ]'$  Weight gain 11-20 lbs for singleton and 25-35 lbs for twin pregnancy (IOM guidelines) Higher class of obesity patients recommended to gain closer to lower limit  Weight loss is associated with adverse outcomes '[ ]'$  Baseline and surveillance labs (pulled in fr   Plantar fasciitis, bilateral  resolved   Polycystic ovary syndrome    Pre-diabetes    no meds, diet controlled, diet not check blood sugar   Prediabetes    "prediabetes"   Pregnancy induced hypertension    Shortness of breath    albuterol inhaler   Sleep apnea    no CPAP use   Supervision of normal first pregnancy 04/06/2019   [name]: [ x]Initial [ x]12 '[ ]'$ 20 '[ ]'$ 28 '[ ]'$ 32 '[ ]'$ 36 '[ ]'$ 38 '[ ]'$ 39 '[ ]'$ 40 Nursing Staff Provider Office Location [facility]  Dating   LMP Language  English  Anatomy US  Nml Flu Vaccine  Declined 07/28/19 Genetic Screen  NIPS: low risks   AFP:   negative  TDaP vaccine   08/25/19 Hgb A1C or  GTT Early A1C 5.8 Third trimester: GDM insulin Rhogam  NA   LAB RESULTS  Feeding Plan Breast Blood Type   Vertigo 2017    Patient Active Problem  List   Diagnosis Date Noted   IUD check up 05/15/2021   Encounter for insertion of mirena IUD 04/01/2021   Levator spasm 11/21/2020   Galactorrhea 11/19/2020   Hx of migraine headaches 04/09/2020   Contraceptive management 11/22/2019   Positive RPR test 10/09/2019   HTN (hypertension) 09/23/2019   Migraine headache 07/12/2019   Alpha thalassemia trait    Vitamin D deficiency 09/16/2017   Prediabetes 09/16/2017   Depression 09/16/2017   Class 3 severe obesity with serious comorbidity and body mass index (BMI) of 50.0 to 59.9 in adult ([place]) 09/16/2017   PCOS (polycystic ovarian syndrome) 08/13/2017   Mass 05/06/2017   Vertigo 10/06/2016   Microcytic anemia 09/26/2016   Pseudotumor cerebri syndrome 08/28/2016   Obesity hypoventilation syndrome ([place]) 08/28/2016   Benign paroxysmal positional vertigo due to bilateral vestibular disorder 08/28/2016   OSA (obstructive sleep apnea) 08/28/2016   Hypersomnia with sleep apnea 08/28/2016   Female hirsutism 08/28/2016   GERD (gastroesophageal reflux disease) 02/24/2015   Constipation 10/02/2014    Past Surgical History:  Procedure Laterality Date   CESAREAN SECTION N/A 10/09/2019   Procedure: CESAREAN SECTION;  Surgeon: Aletha Halim, MD;  Location: [facility];  Service: Obstetrics;  Laterality: N/A;   COLONOSCOPY WITH PROPOFOL  10/07/2016   DILATION AND CURETTAGE OF UTERUS     ESOPHAGOGASTRODUODENOSCOPY  12/21/2015   EXCISION MASS UPPER EXTREMETIES Left 04/21/2017   Procedure: EXCISION MASS LEFT MIDDLE FINGER;  Surgeon: Daryll Brod, MD;  Location: [facility];  Service: Orthopedics;  Laterality: Left;   IR FL GUIDED LOC OF NEEDLE/CATH TIP FOR SPINAL INJECTION RT  03/13/2020   PHOTOCOAGULATION WITH LASER Left 12/13/2020   Procedure: RETINOPLEXY LEFT EYE;  Surgeon: Sherlynn Stalls, MD;  Location: [facility];  Service: Ophthalmology;  Laterality: Left;   SCLERAL BUCKLE Right 12/13/2020   Procedure: SCLERAL BUCKLE WITH CRYO;  Surgeon:  Sherlynn Stalls, MD;  Location: [facility];  Service: Ophthalmology;  Laterality: Right;     OB History     Gravida  1   Para  1   Term  1   Preterm  0   AB  0   Living  1      SAB  0   IAB  0   Ectopic  0   Multiple  0   Live Births  1           Family History  Problem Relation Age of Onset   Diabetes Maternal Aunt    Hypertension Maternal Uncle  Depression Maternal Grandfather    Diabetes Maternal Grandfather    Hypertension Maternal Grandfather    Arthritis Mother    High blood pressure Mother    Depression Mother    Sleep apnea Mother    Obesity Mother    Diabetes Mother    Hypertension Mother    Kidney failure Mother    Sleep apnea Father    Obesity Father    Healthy Daughter     Social History   Tobacco Use   Smoking status: Never   Smokeless tobacco: Never  Vaping Use   Vaping Use: Never used  Substance Use Topics   Alcohol use: Not Currently    Comment: social    Drug use: No    Home Medications Prior to Admission medications   Medication Sig Start Date End Date Taking? Authorizing Provider  albuterol (VENTOLIN HFA) 108 (90 Base) MCG/ACT inhaler Inhale 2 puffs into the lungs every 6 (six) hours as needed for wheezing or shortness of breath. 05/06/21   Ward, Lenise Arena, PA-C  amLODipine (NORVASC) 10 MG tablet Take 1 tablet (10 mg total) by mouth daily. 11/08/20   Nafziger, Tommi Rumps, NP  benzonatate (TESSALON) 200 MG capsule Take 1 capsule (200 mg total) by mouth 3 (three) times daily as needed for cough. 05/09/21   Volney American, PA-C  diclofenac (VOLTAREN) 75 MG EC tablet Take 1 tablet (75 mg total) by mouth 2 (two) times daily. Patient taking differently: Take 75 mg by mouth 2 (two) times daily as needed (pain). 07/11/20   Dene Gentry, MD  Dulaglutide (TRULICITY) [id number] SOPN Inject 0.75 mg into the skin every Wednesday.    [provider]  ferrous sulfate 325 (65 FE) MG tablet Take 1 tablet (325 mg total) by mouth  every other day. 02/23/20 07/19/21  Nafziger, Tommi Rumps, NP  fluticasone-salmeterol (ADVAIR HFA) [id number] MCG/ACT inhaler Inhale 2 puffs into the lungs 2 (two) times daily. 05/29/21   Nafziger, Tommi Rumps, NP  gabapentin (NEURONTIN) 600 MG tablet Take 600 mg by mouth 3 (three) times daily. 05/18/21   [provider]  hydrochlorothiazide (HYDRODIURIL) 25 MG tablet Take 1 tablet (25 mg total) by mouth daily. 11/08/20   Nafziger, Tommi Rumps, NP  ibuprofen (ADVIL) 600 MG tablet Take 1 tablet (600 mg total) by mouth every 6 (six) hours as needed. [id number]   Campbell Stall P, DO  liraglutide (VICTOZA) 18 MG/3ML SOPN Inject 1.8 mg into the skin daily. 1.8 Patient not taking: No sig reported 05/06/21   Laqueta Linden, MD  loratadine (CLARITIN) 10 MG tablet Take 10 mg by mouth daily as needed for allergies.     [provider]  naproxen (NAPROSYN) 500 MG tablet Take 1 tablet (500 mg total) by mouth 2 (two) times daily with a meal. Patient taking differently: Take 500 mg by mouth 2 (two) times daily as needed (pain). 03/29/21   Jaynee Eagles, PA-C  oxyCODONE-acetaminophen (PERCOCET/ROXICET) 5-325 MG tablet Take 1 tablet by mouth 5 (five) times daily.    [provider]  polyethylene glycol powder (GLYCOLAX/MIRALAX) 17 GM/SCOOP powder Take 17 g by mouth daily as needed for moderate constipation.  10/11/19   [provider]  tiZANidine (ZANAFLEX) 4 MG tablet Take 1 tablet (4 mg total) by mouth every 6 (six) hours as needed for muscle spasms. 02/14/21   Gregor Hams, MD  Vitamin D, Ergocalciferol, (DRISDOL) 1.25 MG (50000 UNIT) CAPS capsule Take 1 capsule (50,000 Units total) by mouth every 7 (seven) days.  Patient taking differently: Take 50,000 Units by mouth every Monday. 05/06/21   Laqueta Linden, MD    Allergies    Bee pollen, Bee venom, Hydrocodone-acetaminophen, Peach flavor, Pollen extract, Remdesivir, Cortisone, and Cortizone-10 [hydrocortisone]  Review of Systems   Review of Systems   Constitutional: Negative.   HENT: Negative.    Respiratory:  Positive for cough, chest tightness and shortness of breath.   Cardiovascular:  Positive for chest pain. Negative for palpitations and leg swelling.  Gastrointestinal: Negative.   Genitourinary: Negative.   Musculoskeletal: Negative.   Skin: Negative.   Neurological: Negative.   All other systems reviewed and are negative.  Physical Exam Updated Vital Signs BP (!) 145/82   Pulse 93   Temp 98.4 F (36.9 C)   Resp 18   Ht 6' 2.5" (1.892 m)   Wt (!) 204.1 kg   LMP 05/30/2021 (Approximate) Comment: negative preg test  SpO2 98%   BMI 57.00 kg/m   Physical Exam Vitals and nursing note reviewed.  Constitutional:      General: She is not in acute distress.    Appearance: She is well-developed. She is obese. She is not ill-appearing, toxic-appearing or diaphoretic.  HENT:     Head: Atraumatic.  Eyes:     Pupils: Pupils are equal, round, and reactive to light.  Cardiovascular:     Rate and Rhythm: Normal rate.     Pulses:          Radial pulses are 2+ on the right side and 2+ on the left side.       Dorsalis pedis pulses are 2+ on the right side and 2+ on the left side.     Heart sounds: Normal heart sounds.  Pulmonary:     Effort: Pulmonary effort is normal. No respiratory distress.     Breath sounds: Normal breath sounds.     Comments: Minimal wheeze on exam however difficult due to body habitus.  No acute respiratory distress.  No tachypnea or hypoxia Abdominal:     General: Bowel sounds are normal. There is no distension.     Palpations: Abdomen is soft.  Musculoskeletal:        General: Normal range of motion.     Cervical back: Normal range of motion.     Right lower leg: No tenderness. No edema.     Left lower leg: No tenderness. No edema.  Skin:    General: Skin is warm and dry.     Capillary Refill: Capillary refill takes less than 2 seconds.  Neurological:     General: No focal deficit present.      Mental Status: She is alert.  Psychiatric:        Mood and Affect: Mood normal.    ED Results / Procedures / Treatments   Labs (all labs ordered are listed, but only abnormal results are displayed) Labs Reviewed  CBC WITH DIFFERENTIAL/PLATELET - Abnormal; Notable for the following components:      Result Value   RBC 5.56 (*)    MCV 76.3 (*)    MCH 23.6 (*)    All other components within normal limits  BASIC METABOLIC PANEL - Abnormal; Notable for the following components:   Glucose, Bld 109 (*)    All other components within normal limits  I-STAT BETA HCG BLOOD, ED ([facility], [facility], [facility] ONLY)  TROPONIN I (HIGH SENSITIVITY)    EKG None  Radiology CT Angio Chest PE W/Cm &/Or Wo Cm  Result Date:  06/09/2021 CLINICAL DATA:  Suspected pulmonary embolism in a 25 year old female. EXAM: CT ANGIOGRAPHY CHEST WITH CONTRAST TECHNIQUE: Multidetector CT imaging of the chest was performed using the standard protocol during bolus administration of intravenous contrast. Multiplanar CT image reconstructions and MIPs were obtained to evaluate the vascular anatomy. CONTRAST:  163m OMNIPAQUE IOHEXOL 350 MG/ML SOLN COMPARISON:  Exam from June 11, 2020. FINDINGS: Cardiovascular: Normal caliber of the thoracic aorta. 3.5 cm main pulmonary artery. Assessment limited by respiratory motion and bolus timing. No large central pulmonary embolism. Very limited opacification of vessels beyond the main pulmonary arteries and secondary limitation due to respiratory motion. Heart size top-normal in the setting of low lung volumes. Mediastinum/Nodes: Esophagus mildly patulous. No signs of thoracic inlet, axillary or mediastinal lymphadenopathy. No hilar adenopathy. Lungs/Pleura: Lungs are clear. Airways are patent. No pneumothorax. No pleural effusion. Upper Abdomen: Hepatic steatosis likely with hepatomegaly, liver incompletely imaged. Musculoskeletal: No acute musculoskeletal process. No destructive bone finding. Review of the  MIP images confirms the above findings. IMPRESSION: 1. Assessment limited by respiratory motion and bolus timing. No signs of gross central pulmonary embolism. Highly limited evaluation due to bolus timing, respiratory motion and body habitus. 2. No acute cardiopulmonary process. 3. Enlarged main pulmonary artery, can be seen with pulmonary arterial hypertension. 4. Hepatic steatosis likely with hepatomegaly, liver incompletely imaged. Electronically Signed   By: GZetta BillsM.D.   On: 06/09/2021 13:54   DG Chest Portable 1 View  Result Date: 06/07/2021 CLINICAL DATA:  Chest pain. EXAM: PORTABLE CHEST 1 VIEW COMPARISON:  Chest radiograph dated 06/07/2021. FINDINGS: No focal consolidation, pleural effusion, or pneumothorax. Mild cardiomegaly. No acute osseous pathology. IMPRESSION: 1. No acute cardiopulmonary process. 2. Mild cardiomegaly. Electronically Signed   By: AAnner CreteM.D.   On: 06/07/2021 20:58    Procedures Procedures   Medications Ordered in ED Medications  iohexol (OMNIPAQUE) 350 MG/ML injection 100 mL (100 mLs Intravenous Contrast Given 06/09/21 1324)   ED Course  I have reviewed the triage vital signs and the nursing notes.  Pertinent labs & imaging results that were available during my care of the patient were reviewed by me and considered in my medical decision making (see chart for details).  Here for evaluation of chest tightness and shortness of breath.  Unfortunately this seems to be an ongoing issue.  She is afebrile, nonseptic, not ill-appearing.  Seen in the emergency department 3 days ago had elevated D-dimer at that time however elected to not have CTA performed per previous provider note.  Difficult lung exam due to body habitus heart is have mild wheeze.  Does not appear grossly fluid overloaded.  Heart clear.  Abdomen soft.  Plan on labs, imaging and reassess.  Given had elevated D-dimer will obtain CTA here to ensure no PE however clinically I have low  suspicion for this  Labs and imaging personally reviewed and interpreted: CBC without leukocytosis, hemoglobin [id number] panel glucose 10000000Troponin 2 Pregnancy test negative EKG without ischemic changes CTA chest difficult due to body habitus, contrast timing as well as respiratory pattern.  However no large PE.  No obvious infectious process.  Does have possible pulmonary hypertension  Patient reassessed.  She has no acute respiratory failure.  She is amatory without any hypoxia.  Symptoms likely multifactorial in nature.  Pulmonary hypertension versus underlying restrictive lung disease.  Did encourage patient to follow-up with PCP given she has diagnosis of sleep apnea and does not wear CPAP.  Does not seem infectious  in nature.  She is clear breath sounds on reassessment I have low suspicion for acute asthma exacerbation.  Referral sent to pulmonology to see if she can get in sooner.  Discussed follow-up with PCP tomorrow.  Return for new or worsening symptoms.  The patient has been appropriately medically screened and/or stabilized in the ED. I have low suspicion for any other emergent medical condition which would require further screening, evaluation or treatment in the ED or require inpatient management.  Patient is hemodynamically stable and in no acute distress.  Patient able to ambulate in department prior to ED.  Evaluation does not show acute pathology that would require ongoing or additional emergent interventions while in the emergency department or further inpatient treatment.  I have discussed the diagnosis with the patient and answered all questions.  Pain is been managed while in the emergency department and patient has no further complaints prior to discharge.  Patient is comfortable with plan discussed in room and is stable for discharge at this time.  I have discussed strict return precautions for returning to the emergency department.  Patient was encouraged to follow-up  with PCP/specialist refer to at discharge.     MDM Rules/Calculators/A&P                            Final Clinical Impression(s) / ED Diagnoses Final diagnoses:  SOB (shortness of breath)  Chest tightness    Rx / DC Orders ED Discharge Orders          Ordered    Ambulatory referral to Pulmonology        06/09/21 1432             Tanazia Achee A, PA-C 06/09/21 1437    Regan Lemming, MD 06/09/21 1534

## 2021-06-09 NOTE — ED Triage Notes (Signed)
Pt c/o CP, shob x 1 month that has gotten more severe in the last week.  Pt also c/o shooting pain/numbness to right side of neck that extends down right side of body.

## 2021-06-10 ENCOUNTER — Telehealth: Payer: Self-pay

## 2021-06-10 NOTE — Telephone Encounter (Signed)
Pt will be seen 06/11/2021 at 11am. Pt stated that the ED doctor advised that she f/u with PCP, pulmonary and Cardio. Pt needs a referral to Cardio. Okay for referral?  Also, ED doctor also noted elevated D Dimer. Pt articulated she was advised that a clot is forming but they do not know where.

## 2021-06-10 NOTE — Progress Notes (Signed)
Synopsis: Referred for asthma with exacerbation by Dorothyann Peng, NP  Subjective:   PATIENT ID: Maureen Lewis GENDER: female DOB: [birth year], MRN: [id number]  Chief Complaint  Patient presents with   Consult    States recent ED visits, was told she had PH, wheezing, productive cough, brown/yellow sputum    24yF with obesity, asthma (she thinks she maybe had PFTs a long time ago), Moderate OSA (HSAT 03/26/20) not on CPAP - has machine still but needs a new mask (it's a ResMed Airsense with nasal pillows) following with [facility], GERD, PCOS, alpha thal trait.  She says she isn't doing well lately. Has dyspnea, chest pain, cough over last 3-4 months. She is using advair 2 puffs once daily, rinses mouth afterward - she says she thinks she's only been on it for about 2 weeks and she notices no effect from this. Prednisone burst did decrease her dyspnea but didn't wipe it out altogether. Diagnosed with asthma some time in the last several years. She is unable to identify any reliable trigger for asthma. All of her symptoms are worse lying down. She does think she has a metallic type taste in her mouth over last 4-5 months. She doesn't think it necessarily feels like heartburn. Has DOE to a couple of steps currently. She sleeps on an incline due to DOE. Cough is productive throughout the day, yellowish-brownish. Sometimes has postnasal drainage.   CP is achy stabbing pressure central, radiates outward. Happens at rest, a bit worse with exertion. Worse also with deep breath, coughing. She hasn't identified anything to relieve it.   At night she feels like she's suffocating.     Otherwise pertinent review of systems is negative.  She went on leave after this weekend, worked as Scientist, water quality at [employer]. Has never lived outside of [place], she has no pets at home.   Never smoked, vaped.  No family history of lung disease   Past Medical History:  Diagnosis Date   Abnormal uterine  bleeding    Acid reflux    Alpha thalassemia trait    Amenorrhea    Anemia    no current med.   Asthma    Back pain    Cesarean delivery delivered 10/11/2019   10/09/2019 - primary CS for failed IOL   Chest pain    resolved   Complication of anesthesia    states had to keep giving her anesthesia during EGD   Constipation    Depression    "I'm good"   Fibromyalgia    Finger mass, left 03/2017   middle finger   Gestational diabetes 05/23/2019   hx - with pregnancy only    Gestational hypertension 09/23/2019   Guidelines for Antenatal Testing and Sonography  (with updated ICD-10 codes)  Updated  09-19-2019 with Dr. Tama High  INDICATION U/S 2 X week NST/AFI  or full BPP wkly DELIVERY Diabetes   A1 - good control - O24.410    A2 - good control - O24.419      A2  - poor control or poor compliance - O24.419, E11.65   (Macrosomia or polyhydramnios) **E11.65 is extra code for poor control**    A2/B - O24.   Headache    Hypertension    Infection    UTI   Irritable bowel syndrome (IBS)    Joint pain    Morbid obesity with body mass index (BMI) of 45.0 to 49.9 in adult [facility])    Obesity during pregnancy, antepartum 04/06/2019  Body mass index is 53.71 kg/m.  Recommendations '[x]'$  Aspirin 81 mg daily after 12 weeks; discontinue after 36 weeks '[ ]'$  Nutrition consult '[ ]'$  Weight gain 11-20 lbs for singleton and 25-35 lbs for twin pregnancy (IOM guidelines) Higher class of obesity patients recommended to gain closer to lower limit  Weight loss is associated with adverse outcomes '[ ]'$  Baseline and surveillance labs (pulled in fr   Plantar fasciitis, bilateral    resolved   Polycystic ovary syndrome    Pre-diabetes    no meds, diet controlled, diet not check blood sugar   Prediabetes    "prediabetes"   Pregnancy induced hypertension    Shortness of breath    albuterol inhaler   Sleep apnea    no CPAP use   Supervision of normal first pregnancy 04/06/2019   [name]: [ x]Initial  [ x]12 '[ ]'$ 20 '[ ]'$ 28 '[ ]'$ 32 '[ ]'$ 36 '[ ]'$ 38 '[ ]'$ 39 '[ ]'$ 40 Nursing Staff Provider Office Location [facility]  Dating   LMP Language  English  Anatomy US  Nml Flu Vaccine  Declined 07/28/19 Genetic Screen  NIPS: low risks   AFP:   negative  TDaP vaccine   08/25/19 Hgb A1C or  GTT Early A1C 5.8 Third trimester: GDM insulin Rhogam  NA   LAB RESULTS  Feeding Plan Breast Blood Type   Vertigo 2017     Family History  Problem Relation Age of Onset   Diabetes Maternal Aunt    Hypertension Maternal Uncle    Depression Maternal Grandfather    Diabetes Maternal Grandfather    Hypertension Maternal Grandfather    Arthritis Mother    High blood pressure Mother    Depression Mother    Sleep apnea Mother    Obesity Mother    Diabetes Mother    Hypertension Mother    Kidney failure Mother    Sleep apnea Father    Obesity Father    Healthy Daughter      Past Surgical History:  Procedure Laterality Date   CESAREAN SECTION N/A 10/09/2019   Procedure: CESAREAN SECTION;  Surgeon: Aletha Halim, MD;  Location: [facility];  Service: Obstetrics;  Laterality: N/A;   COLONOSCOPY WITH PROPOFOL  10/07/2016   DILATION AND CURETTAGE OF UTERUS     ESOPHAGOGASTRODUODENOSCOPY  12/21/2015   EXCISION MASS UPPER EXTREMETIES Left 04/21/2017   Procedure: EXCISION MASS LEFT MIDDLE FINGER;  Surgeon: Daryll Brod, MD;  Location: [place];  Service: Orthopedics;  Laterality: Left;   IR FL GUIDED LOC OF NEEDLE/CATH TIP FOR SPINAL INJECTION RT  03/13/2020   PHOTOCOAGULATION WITH LASER Left 12/13/2020   Procedure: RETINOPLEXY LEFT EYE;  Surgeon: Sherlynn Stalls, MD;  Location: [place];  Service: Ophthalmology;  Laterality: Left;   SCLERAL BUCKLE Right 12/13/2020   Procedure: SCLERAL BUCKLE WITH CRYO;  Surgeon: Sherlynn Stalls, MD;  Location: [place];  Service: Ophthalmology;  Laterality: Right;    Social History   Socioeconomic History   Marital status: Single    Spouse name: Not on file   Number of children: Not on file    Years of education: Not on file   Highest education level: Not on file  Occupational History   Occupation: Chemical engineer    Employer: [id number]  Tobacco Use   Smoking status: Never   Smokeless tobacco: Never  Vaping Use   Vaping Use: Never used  Substance and Sexual Activity   Alcohol use: Not Currently    Comment: social  Drug use: No   Sexual activity: Yes    Birth control/protection: Condom  Other Topics Concern   Not on file  Social History Narrative   Right handed    Soda sometimes   Lives with mom and cousin, a grandmother in [place] also helps care for her.       She is in nursing school.    Social Determinants of Health   Financial Resource Strain: Not on file  Food Insecurity: Not on file  Transportation Needs: Not on file  Physical Activity: Not on file  Stress: Not on file  Social Connections: Not on file  Intimate Partner Violence: Not on file     Allergies  Allergen Reactions   Bee Pollen Hives, Shortness Of Breath and Swelling   Bee Venom Hives, Shortness Of Breath and Swelling   Hydrocodone-Acetaminophen Anaphylaxis    No problem when she takes Tylenol   Peach Flavor Hives, Shortness Of Breath and Other (See Comments)    SWELLING OF MOUTH   Pollen Extract Hives, Shortness Of Breath and Swelling   Remdesivir Swelling    Angioedema 8/25   Cortisone Itching and Swelling   Cortizone-10 [Hydrocortisone]     Swelling and itching     Outpatient Medications Prior to Visit  Medication Sig Dispense Refill   albuterol (VENTOLIN HFA) 108 (90 Base) MCG/ACT inhaler Inhale 2 puffs into the lungs every 6 (six) hours as needed for wheezing or shortness of breath. 8 g 0   amLODipine (NORVASC) 10 MG tablet Take 1 tablet (10 mg total) by mouth daily. 90 tablet 3   benzonatate (TESSALON) 200 MG capsule Take 1 capsule (200 mg total) by mouth 3 (three) times daily as needed for cough. 20 capsule 0   diclofenac (VOLTAREN) 75 MG EC tablet Take 1 tablet (75 mg  total) by mouth 2 (two) times daily. (Patient taking differently: Take 75 mg by mouth 2 (two) times daily as needed (pain).) 60 tablet 1   Dulaglutide (TRULICITY) A999333 0000000 SOPN Inject 0.75 mg into the skin every Wednesday.     ferrous sulfate 325 (65 FE) MG tablet Take 1 tablet (325 mg total) by mouth every other day. 45 tablet 1   fluticasone-salmeterol (ADVAIR HFA) 115-21 MCG/ACT inhaler Inhale 2 puffs into the lungs 2 (two) times daily. 3 each 3   gabapentin (NEURONTIN) 600 MG tablet Take 600 mg by mouth 3 (three) times daily.     hydrochlorothiazide (HYDRODIURIL) 25 MG tablet Take 1 tablet (25 mg total) by mouth daily. 90 tablet 3   ibuprofen (ADVIL) 600 MG tablet Take 1 tablet (600 mg total) by mouth every 6 (six) hours as needed. 30 tablet 0   loratadine (CLARITIN) 10 MG tablet Take 10 mg by mouth daily as needed for allergies.      naproxen (NAPROSYN) 500 MG tablet Take 1 tablet (500 mg total) by mouth 2 (two) times daily with a meal. (Patient taking differently: Take 500 mg by mouth 2 (two) times daily as needed (pain).) 30 tablet 0   oxyCODONE-acetaminophen (PERCOCET/ROXICET) 5-325 MG tablet Take 1 tablet by mouth 5 (five) times daily.     polyethylene glycol powder (GLYCOLAX/MIRALAX) 17 GM/SCOOP powder Take 17 g by mouth daily as needed for moderate constipation.      tiZANidine (ZANAFLEX) 4 MG tablet Take 1 tablet (4 mg total) by mouth every 6 (six) hours as needed for muscle spasms. 30 tablet 2   Vitamin D, Ergocalciferol, (DRISDOL) 1.25 MG (50000 UNIT) CAPS capsule  Take 1 capsule (50,000 Units total) by mouth every 7 (seven) days. (Patient taking differently: Take 50,000 Units by mouth every Monday.) 4 capsule 0   liraglutide (VICTOZA) 18 MG/3ML SOPN Inject 1.8 mg into the skin daily. 1.8 (Patient not taking: No sig reported) 6 mL 0   No facility-administered medications prior to visit.       Objective:   Physical Exam:  General appearance: 25 y.o., female, NAD, conversant   Eyes: anicteric sclerae, moist conjunctivae; no lid-lag; PERRL, tracking appropriately HENT: NCAT; oropharynx, MMM, no mucosal ulcerations; normal hard and soft palate Neck: Trachea midline; no lymphadenopathy, no JVD Lungs: CTAB, no crackles, no wheeze, with normal respiratory effort CV: RRR, no MRGs  Abdomen: Soft, non-tender; non-distended, BS present, obese  Extremities: No peripheral edema, radial and DP pulses present bilaterally  Skin: Normal temperature, turgor and texture; no rash Psych: Appropriate affect Neuro: Alert and oriented to person and place, no focal deficit    Vitals:   06/11/21 1102  BP: 132/78  Pulse: 90  Temp: 98.1 F (36.7 C)  TempSrc: Oral  SpO2: 95%  Weight: (!) 472 lb 12.8 oz (214.5 kg)  Height: 6' 2.5" (1.892 m)   95% on RA BMI Readings from Last 3 Encounters:  06/11/21 59.89 kg/m  06/09/21 57.00 kg/m  06/07/21 57.00 kg/m   Wt Readings from Last 3 Encounters:  06/11/21 (!) 472 lb 12.8 oz (214.5 kg)  06/09/21 (!) 450 lb (204.1 kg)  06/07/21 (!) 450 lb (204.1 kg)     CBC    Component Value Date/Time   WBC 6.4 06/09/2021 1014   RBC 5.56 (H) 06/09/2021 1014   HGB 13.1 06/09/2021 1014   HGB 13.0 05/06/2021 1059   HCT 42.4 06/09/2021 1014   HCT 42.4 05/06/2021 1059   PLT 275 06/09/2021 1014   PLT 268 05/06/2021 1059   MCV 76.3 (L) 06/09/2021 1014   MCV 74 (L) 05/06/2021 1059   MCH 23.6 (L) 06/09/2021 1014   MCHC 30.9 06/09/2021 1014   RDW 15.1 06/09/2021 1014   RDW 15.4 05/06/2021 1059   LYMPHSABS 1.8 06/09/2021 1014   LYMPHSABS 1.9 05/06/2021 1059   MONOABS 0.3 06/09/2021 1014   EOSABS 0.1 06/09/2021 1014   EOSABS 0.1 05/06/2021 1059   BASOSABS 0.0 06/09/2021 1014   BASOSABS 0.0 05/06/2021 1059    No eosinophil elevation  Chest Imaging: CTA Chest 06/09/21 reviewed by me and remarkable for large heart  Pulmonary Functions Testing Results: No flowsheet data found.   Echocardiogram:  TTE 10/03/20 indeterminate for DD, LV  a little dilated      Assessment & Plan:   # Atypical chest pain: Hasn't been responsive to steroids, EKG and trops not supportive of ACS when seen in ED. Some features suggestive of gerd - worse lying down, has odd metallic taste in her mouth almost constantly.   # DOE: likely multifactorial with deconditioning, may be developing PH/diastolic dysfunction, may have asthma or obesity related air trapping.   # OSA: weight is essentially stable since last HSAT, bicarb not elevated typically.  # Asthma: unclear trigger. Hasn't had opportunity for testing to support diagnosis.  Plan: - encouraged to set up follow up with [facility] medical weight management clinic - encouraged to reach out to Neuro to discuss finding optimal mask to support adherence to CPAP - flonase daily after shower for component of AR - start protonix 40 mg daily first thing in the morning before eating - stop advair until we get PFTs  in 1-2 weeks, stop albuterol 2 days before PFTs so that we don't mask features of asthma - if confirm ventilatory defect on PFT will refer to pulmonary rehab if she is not otherwise involved in structured exercise program - if PFTs don't point to clear explanation for dyspnea, then will get TTE        Maryjane Hurter, MD [facility] 06/11/2021 11:07 AM

## 2021-06-10 NOTE — Telephone Encounter (Signed)
Transition Care Management Unsuccessful Follow-up Telephone Call  Date of discharge and from where:  06/09/2021-[facility]   Attempts:  1st Attempt  Reason for unsuccessful TCM follow-up call:  Left voice message

## 2021-06-11 ENCOUNTER — Other Ambulatory Visit: Payer: Self-pay

## 2021-06-11 ENCOUNTER — Encounter: Payer: Self-pay | Admitting: Student

## 2021-06-11 ENCOUNTER — Encounter: Payer: Medicaid Other | Admitting: Physical Therapy

## 2021-06-11 ENCOUNTER — Ambulatory Visit (INDEPENDENT_AMBULATORY_CARE_PROVIDER_SITE_OTHER): Payer: Medicaid Other | Admitting: Student

## 2021-06-11 VITALS — BP 132/78 | HR 90 | Temp 98.1°F | Ht 74.5 in | Wt >= 6400 oz

## 2021-06-11 DIAGNOSIS — R0609 Other forms of dyspnea: Secondary | ICD-10-CM

## 2021-06-11 DIAGNOSIS — K219 Gastro-esophageal reflux disease without esophagitis: Secondary | ICD-10-CM | POA: Diagnosis not present

## 2021-06-11 DIAGNOSIS — R0789 Other chest pain: Secondary | ICD-10-CM

## 2021-06-11 DIAGNOSIS — R053 Chronic cough: Secondary | ICD-10-CM

## 2021-06-11 DIAGNOSIS — G4733 Obstructive sleep apnea (adult) (pediatric): Secondary | ICD-10-CM

## 2021-06-11 DIAGNOSIS — R0600 Dyspnea, unspecified: Secondary | ICD-10-CM | POA: Diagnosis not present

## 2021-06-11 MED ORDER — PANTOPRAZOLE SODIUM 40 MG PO TBEC: 40.0000 mg | DELAYED_RELEASE_TABLET | Freq: Every day | ORAL | 0 refills | Status: DC

## 2021-06-11 MED ORDER — FLUTICASONE PROPIONATE 50 MCG/ACT NA SUSP: 1.0000 | Freq: Every day | NASAL | 2 refills | Status: DC

## 2021-06-11 NOTE — Telephone Encounter (Signed)
Transition Care Management Unsuccessful Follow-up Telephone Call  Date of discharge and from where:  06/09/2021 from [facility]  Attempts:  2nd Attempt  Reason for unsuccessful TCM follow-up call:  Left voice message

## 2021-06-11 NOTE — Patient Instructions (Addendum)
-   TRY to STOP taking advair until you have your PFTs. Ideally we can be off of advair for a couple of weeks before your PFTs. If you have trouble breathing after you stop your advair, just restart it and let me know with a message. - Keep using albuterol 2 puffs twice daily. AFTER your albuterol do flutter valve 10 slow but firm puffs twice daily - this should help with your junky cough. BUT I need you to try to stop the albuterol TWO days before your PFTs. Just like advair it can interfere with these tests and mask the diagnosis of asthma. BUT if you have trouble breathing after you stop your advair, just restart it and let me know with a message. - We will schedule PFTs in a couple weeks (breathing tests) - Start taking protonix 40 mg daily every morning 30 minutes before you eat - GERD can trigger chronic cough/asthma - After your breathing tests I will make referral to pulmonary rehab for exercise if there is ventilatory defect (whether asthma or restrictive lung disease)

## 2021-06-12 NOTE — Telephone Encounter (Signed)
Transition Care Management Unsuccessful Follow-up Telephone Call  Date of discharge and from where:  06/09/2021-[facility]  Attempts:  3rd Attempt  Reason for unsuccessful TCM follow-up call:  Left voice message

## 2021-06-13 ENCOUNTER — Ambulatory Visit: Payer: Medicaid Other | Admitting: Adult Health

## 2021-06-13 ENCOUNTER — Encounter: Payer: Self-pay | Admitting: Adult Health

## 2021-06-13 ENCOUNTER — Other Ambulatory Visit: Payer: Self-pay

## 2021-06-13 VITALS — BP 138/100 | HR 103 | Temp 99.0°F | Ht 74.5 in | Wt >= 6400 oz

## 2021-06-13 DIAGNOSIS — I10 Essential (primary) hypertension: Secondary | ICD-10-CM | POA: Diagnosis not present

## 2021-06-13 DIAGNOSIS — R0600 Dyspnea, unspecified: Secondary | ICD-10-CM

## 2021-06-13 DIAGNOSIS — G932 Benign intracranial hypertension: Secondary | ICD-10-CM | POA: Diagnosis not present

## 2021-06-13 DIAGNOSIS — E119 Type 2 diabetes mellitus without complications: Secondary | ICD-10-CM | POA: Diagnosis not present

## 2021-06-13 DIAGNOSIS — Z9989 Dependence on other enabling machines and devices: Secondary | ICD-10-CM | POA: Diagnosis not present

## 2021-06-13 DIAGNOSIS — Z794 Long term (current) use of insulin: Secondary | ICD-10-CM | POA: Diagnosis not present

## 2021-06-13 DIAGNOSIS — E611 Iron deficiency: Secondary | ICD-10-CM | POA: Diagnosis not present

## 2021-06-13 DIAGNOSIS — E282 Polycystic ovarian syndrome: Secondary | ICD-10-CM | POA: Diagnosis not present

## 2021-06-13 DIAGNOSIS — G4733 Obstructive sleep apnea (adult) (pediatric): Secondary | ICD-10-CM | POA: Diagnosis not present

## 2021-06-13 DIAGNOSIS — K2100 Gastro-esophageal reflux disease with esophagitis, without bleeding: Secondary | ICD-10-CM | POA: Diagnosis not present

## 2021-06-13 DIAGNOSIS — J4520 Mild intermittent asthma, uncomplicated: Secondary | ICD-10-CM | POA: Diagnosis not present

## 2021-06-13 DIAGNOSIS — R0609 Other forms of dyspnea: Secondary | ICD-10-CM

## 2021-06-13 DIAGNOSIS — K219 Gastro-esophageal reflux disease without esophagitis: Secondary | ICD-10-CM | POA: Diagnosis not present

## 2021-06-13 DIAGNOSIS — R053 Chronic cough: Secondary | ICD-10-CM | POA: Diagnosis not present

## 2021-06-13 DIAGNOSIS — R7303 Prediabetes: Secondary | ICD-10-CM | POA: Diagnosis not present

## 2021-06-13 NOTE — Progress Notes (Signed)
Subjective:    Patient ID: Maureen Lewis, female    DOB: 08-10-1996, 25 y.o.   MRN: [id number]  HPI  25 year old female who  has a past medical history of Abnormal uterine bleeding, Acid reflux, Alpha thalassemia trait, Amenorrhea, Anemia, Asthma, Back pain, Cesarean delivery delivered (10/11/2019), Chest pain, Complication of anesthesia, Constipation, Depression, Fibromyalgia, Finger mass, left (03/2017), Gestational diabetes (05/23/2019), Gestational hypertension (09/23/2019), Headache, Hypertension, Infection, Irritable bowel syndrome (IBS), Joint pain, Morbid obesity with body mass index (BMI) of 45.0 to 49.9 in adult [facility]), Obesity during pregnancy, antepartum (04/06/2019), Plantar fasciitis, bilateral, Polycystic ovary syndrome, Pre-diabetes, Prediabetes, Pregnancy induced hypertension, Shortness of breath, Sleep apnea, Supervision of normal first pregnancy (04/06/2019), and Vertigo (2017).  She was seen in the emergency room 4 days ago for shortness of breath and chest tightness.  This has been an ongoing issue however worse over the last week.  She experiences shortness of breath with minimal movement.  Has chronic, constant chest tightness times multiple months which is not necessarily exertional in nature.  She has been evaluated roughly 6 times over the last month between urgent care, ED visits, and PCP.  Unfortunately we have not been able to find answers to what is causing this.  He was seen in the ED 3 days prior and had an elevated D-dimer.  CTA not performed at that time due to patient declining CT.  She was subsequently discharged home.  She finished a steroid pack approximately 3 weeks ago which she states did not help significantly with her symptoms.  He did have a CTA during this ER visit which was negative for PE.  It was thought symptoms were likely multifactorial in nature.  Referral was sent to pulmonary to see if the appointment could be moved up.  She was seen on  06/11/2021 by pulmonary  Pulmonary thought some of her features suggestive of GERD, symptoms worse with laying down, has an odd metallic taste in her mouth almost constantly.  She was started on Protonix 40 mg daily the morning  She was advised to reach out to neurology to discuss finding optimal mass to support adherence to CPAP since she was not wearing her CPAP despite history of sleep apnea.  She is going to stop Advair and have PFTs done in 1 to 2 weeks.  PFTs do not point to a clear explanation for dyspnea then will get TTE  Today she reports that she continues to have productive cough with discolored sputum.  No fevers, chills, sinus pain or pressure.  She has stopped her Advair and finds that she is a little bit more short of breath.  She has started Protonix as well as Flonase  She is meeting with her bariatric surgeon today to start that process   Review of Systems See HPI   Past Medical History:  Diagnosis Date   Abnormal uterine bleeding    Acid reflux    Alpha thalassemia trait    Amenorrhea    Anemia    no current med.   Asthma    Back pain    Cesarean delivery delivered 10/11/2019   10/09/2019 - primary CS for failed IOL   Chest pain    resolved   Complication of anesthesia    states had to keep giving her anesthesia during EGD   Constipation    Depression    "I'm good"   Fibromyalgia    Finger mass, left 03/2017   middle finger  Gestational diabetes 05/23/2019   hx - with pregnancy only    Gestational hypertension 09/23/2019   Guidelines for Antenatal Testing and Sonography  (with updated ICD-10 codes)  Updated  10/05/2019 with Dr. Tama High  INDICATION U/S 2 X week NST/AFI  or full BPP wkly DELIVERY Diabetes   A1 - good control - O24.410    A2 - good control - O24.419      A2  - poor control or poor compliance - O24.419, E11.65   (Macrosomia or polyhydramnios) **E11.65 is extra code for poor control**    A2/B - O24.   Headache    Hypertension    Infection     UTI   Irritable bowel syndrome (IBS)    Joint pain    Morbid obesity with body mass index (BMI) of 45.0 to 49.9 in adult [facility])    Obesity during pregnancy, antepartum 04/06/2019   Body mass index is 53.71 kg/m.  Recommendations '[x]'$  Aspirin 81 mg daily after 12 weeks; discontinue after 36 weeks '[ ]'$  Nutrition consult '[ ]'$  Weight gain 11-20 lbs for singleton and 25-35 lbs for twin pregnancy (IOM guidelines) Higher class of obesity patients recommended to gain closer to lower limit  Weight loss is associated with adverse outcomes '[ ]'$  Baseline and surveillance labs (pulled in fr   Plantar fasciitis, bilateral    resolved   Polycystic ovary syndrome    Pre-diabetes    no meds, diet controlled, diet not check blood sugar   Prediabetes    "prediabetes"   Pregnancy induced hypertension    Shortness of breath    albuterol inhaler   Sleep apnea    no CPAP use   Supervision of normal first pregnancy 04/06/2019   [name]: [ x]Initial [ x]12 '[ ]'$ 20 '[ ]'$ 28 '[ ]'$ 32 '[ ]'$ 36 '[ ]'$ 38 '[ ]'$ 39 '[ ]'$ 40 Nursing Staff Provider Office Location [facility]  Dating   LMP Language  English  Anatomy US  Nml Flu Vaccine  Declined 07/28/19 Genetic Screen  NIPS: low risks   AFP:   negative  TDaP vaccine   08/25/19 Hgb A1C or  GTT Early A1C 5.8 Third trimester: GDM insulin Rhogam  NA   LAB RESULTS  Feeding Plan Breast Blood Type   Vertigo 2017    Social History   Socioeconomic History   Marital status: Single    Spouse name: Not on file   Number of children: Not on file   Years of education: Not on file   Highest education level: Not on file  Occupational History   Occupation: Chemical engineer    Employer: [id number]  Tobacco Use   Smoking status: Never   Smokeless tobacco: Never  Vaping Use   Vaping Use: Never used  Substance and Sexual Activity   Alcohol use: Not Currently    Comment: social    Drug use: No   Sexual activity: Yes    Birth control/protection: Condom  Other Topics Concern   Not on file   Social History Narrative   Right handed    Soda sometimes   Lives with mom and cousin, a grandmother in [place] also helps care for her.       She is in nursing school.    Social Determinants of Health   Financial Resource Strain: Not on file  Food Insecurity: Not on file  Transportation Needs: Not on file  Physical Activity: Not on file  Stress: Not on file  Social Connections: Not on file  Intimate Partner  Violence: Not on file    Past Surgical History:  Procedure Laterality Date   CESAREAN SECTION N/A 10/09/2019   Procedure: CESAREAN SECTION;  Surgeon: Aletha Halim, MD;  Location: [facility];  Service: Obstetrics;  Laterality: N/A;   COLONOSCOPY WITH PROPOFOL  10/07/2016   DILATION AND CURETTAGE OF UTERUS     ESOPHAGOGASTRODUODENOSCOPY  12/21/2015   EXCISION MASS UPPER EXTREMETIES Left 04/21/2017   Procedure: EXCISION MASS LEFT MIDDLE FINGER;  Surgeon: Daryll Brod, MD;  Location: [place];  Service: Orthopedics;  Laterality: Left;   IR FL GUIDED LOC OF NEEDLE/CATH TIP FOR SPINAL INJECTION RT  03/13/2020   PHOTOCOAGULATION WITH LASER Left 12/13/2020   Procedure: RETINOPLEXY LEFT EYE;  Surgeon: Sherlynn Stalls, MD;  Location: [place];  Service: Ophthalmology;  Laterality: Left;   SCLERAL BUCKLE Right 12/13/2020   Procedure: SCLERAL BUCKLE WITH CRYO;  Surgeon: Sherlynn Stalls, MD;  Location: [place];  Service: Ophthalmology;  Laterality: Right;    Family History  Problem Relation Age of Onset   Diabetes Maternal Aunt    Hypertension Maternal Uncle    Depression Maternal Grandfather    Diabetes Maternal Grandfather    Hypertension Maternal Grandfather    Arthritis Mother    High blood pressure Mother    Depression Mother    Sleep apnea Mother    Obesity Mother    Diabetes Mother    Hypertension Mother    Kidney failure Mother    Sleep apnea Father    Obesity Father    Healthy Daughter     Allergies  Allergen Reactions   Bee Pollen Hives, Shortness  Of Breath and Swelling   Bee Venom Hives, Shortness Of Breath and Swelling   Hydrocodone-Acetaminophen Anaphylaxis    No problem when she takes Tylenol   Peach Flavor Hives, Shortness Of Breath and Other (See Comments)    SWELLING OF MOUTH   Pollen Extract Hives, Shortness Of Breath and Swelling   Remdesivir Swelling    Angioedema 8/25   Cortisone Itching and Swelling   Cortizone-10 [Hydrocortisone]     Swelling and itching    Current Outpatient Medications on File Prior to Visit  Medication Sig Dispense Refill   amLODipine (NORVASC) 10 MG tablet Take 1 tablet (10 mg total) by mouth daily. 90 tablet 3   benzonatate (TESSALON) 200 MG capsule Take 1 capsule (200 mg total) by mouth 3 (three) times daily as needed for cough. 20 capsule 0   diclofenac (VOLTAREN) 75 MG EC tablet Take 1 tablet (75 mg total) by mouth 2 (two) times daily. (Patient taking differently: Take 75 mg by mouth 2 (two) times daily as needed (pain).) 60 tablet 1   Dulaglutide (TRULICITY) A999333 0000000 SOPN Inject 0.75 mg into the skin every Wednesday.     ferrous sulfate 325 (65 FE) MG tablet Take 1 tablet (325 mg total) by mouth every other day. 45 tablet 1   fluticasone (FLONASE) 50 MCG/ACT nasal spray Place 1 spray into both nostrils daily. 16 g 2   gabapentin (NEURONTIN) 600 MG tablet Take 600 mg by mouth 3 (three) times daily.     hydrochlorothiazide (HYDRODIURIL) 25 MG tablet Take 1 tablet (25 mg total) by mouth daily. 90 tablet 3   ibuprofen (ADVIL) 600 MG tablet Take 1 tablet (600 mg total) by mouth every 6 (six) hours as needed. 30 tablet 0   loratadine (CLARITIN) 10 MG tablet Take 10 mg by mouth daily as needed for allergies.  naproxen (NAPROSYN) 500 MG tablet Take 1 tablet (500 mg total) by mouth 2 (two) times daily with a meal. (Patient taking differently: Take 500 mg by mouth 2 (two) times daily as needed (pain).) 30 tablet 0   oxyCODONE-acetaminophen (PERCOCET/ROXICET) 5-325 MG tablet Take 1 tablet by  mouth 5 (five) times daily.     pantoprazole (PROTONIX) 40 MG tablet Take 1 tablet (40 mg total) by mouth daily. 90 tablet 0   polyethylene glycol powder (GLYCOLAX/MIRALAX) 17 GM/SCOOP powder Take 17 g by mouth daily as needed for moderate constipation.      tiZANidine (ZANAFLEX) 4 MG tablet Take 1 tablet (4 mg total) by mouth every 6 (six) hours as needed for muscle spasms. 30 tablet 2   Vitamin D, Ergocalciferol, (DRISDOL) 1.25 MG (50000 UNIT) CAPS capsule Take 1 capsule (50,000 Units total) by mouth every 7 (seven) days. (Patient taking differently: Take 50,000 Units by mouth every Monday.) 4 capsule 0   albuterol (VENTOLIN HFA) 108 (90 Base) MCG/ACT inhaler Inhale 2 puffs into the lungs every 6 (six) hours as needed for wheezing or shortness of breath. (Patient not taking: Reported on 06/13/2021) 8 g 0   fluticasone-salmeterol (ADVAIR HFA) 115-21 MCG/ACT inhaler Inhale 2 puffs into the lungs 2 (two) times daily. (Patient not taking: Reported on 06/13/2021) 3 each 3   No current facility-administered medications on file prior to visit.    BP (!) 138/100   Pulse (!) 103   Temp 99 F (37.2 C) (Oral)   Ht 6' 2.5" (1.892 m)   Wt (!) 472 lb (214.1 kg)   LMP 05/30/2021 (Approximate) Comment: negative preg test  SpO2 98%   BMI 59.79 kg/m       Objective:   Physical Exam Vitals and nursing note reviewed.  Constitutional:      Appearance: Normal appearance. She is obese.  HENT:     Nose: No congestion or rhinorrhea.     Mouth/Throat:     Mouth: Mucous membranes are moist.  Eyes:     Extraocular Movements: Extraocular movements intact.     Pupils: Pupils are equal, round, and reactive to light.  Cardiovascular:     Rate and Rhythm: Normal rate and regular rhythm.     Pulses: Normal pulses.     Heart sounds: Normal heart sounds.  Pulmonary:     Effort: Pulmonary effort is normal.     Breath sounds: Normal breath sounds.  Skin:    General: Skin is warm and dry.     Capillary Refill:  Capillary refill takes less than 2 seconds.  Neurological:     General: No focal deficit present.     Mental Status: She is alert and oriented to person, place, and time.  Psychiatric:        Mood and Affect: Mood normal.        Behavior: Behavior normal.        Thought Content: Thought content normal.        Judgment: Judgment normal.      Assessment & Plan:  Advised to follow-up with pulmonary as directed.  Continue to use PPI and Flonase.  Unhappy that she is meeting with her bariatric surgeon and has bariatric surgery is likely going to help fix a lot of her issues.  She was advised to follow-up as needed  Dorothyann Peng, NP

## 2021-06-19 ENCOUNTER — Encounter: Payer: Self-pay | Admitting: Adult Health

## 2021-06-20 ENCOUNTER — Other Ambulatory Visit: Payer: Self-pay | Admitting: Adult Health

## 2021-06-20 MED ORDER — BLOOD PRESSURE MON/AUTO/WRIST DEVI: 0 refills | Status: DC

## 2021-06-25 ENCOUNTER — Ambulatory Visit (INDEPENDENT_AMBULATORY_CARE_PROVIDER_SITE_OTHER): Payer: Medicaid Other | Admitting: Student

## 2021-06-25 ENCOUNTER — Other Ambulatory Visit: Payer: Self-pay

## 2021-06-25 ENCOUNTER — Other Ambulatory Visit: Payer: Self-pay | Admitting: Adult Health

## 2021-06-25 ENCOUNTER — Other Ambulatory Visit (HOSPITAL_COMMUNITY)
Admission: RE | Admit: 2021-06-25 | Discharge: 2021-06-25 | Disposition: A | Discharge status: Home / Self Care | Payer: Medicaid Other | Source: Ambulatory Visit | Attending: Obstetrics & Gynecology | Admitting: Obstetrics & Gynecology

## 2021-06-25 ENCOUNTER — Ambulatory Visit (INDEPENDENT_AMBULATORY_CARE_PROVIDER_SITE_OTHER): Payer: Medicaid Other

## 2021-06-25 VITALS — BP 161/89 | HR 81 | Ht 74.0 in | Wt >= 6400 oz

## 2021-06-25 DIAGNOSIS — R0600 Dyspnea, unspecified: Secondary | ICD-10-CM | POA: Diagnosis not present

## 2021-06-25 DIAGNOSIS — N898 Other specified noninflammatory disorders of vagina: Secondary | ICD-10-CM

## 2021-06-25 DIAGNOSIS — R0609 Other forms of dyspnea: Secondary | ICD-10-CM

## 2021-06-25 LAB — PULMONARY FUNCTION TEST
DL/VA % pred: 128 %
DL/VA: 5.69 ml/min/mmHg/L
DLCO cor % pred: 87 %
DLCO cor: 25.91 ml/min/mmHg
DLCO unc % pred: 86 %
DLCO unc: 25.67 ml/min/mmHg
FEF 25-75 Post: 5.07 L/sec
FEF 25-75 Pre: 4.31 L/sec
FEF2575-%Change-Post: 17 %
FEF2575-%Pred-Post: 120 %
FEF2575-%Pred-Pre: 102 %
FEV1-%Change-Post: 6 %
FEV1-%Pred-Post: 89 %
FEV1-%Pred-Pre: 83 %
FEV1-Post: 3.34 L
FEV1-Pre: 3.14 L
FEV1FVC-%Change-Post: 3 %
FEV1FVC-%Pred-Pre: 101 %
FEV6-%Change-Post: 2 %
FEV6-%Pred-Post: 83 %
FEV6-%Pred-Pre: 81 %
FEV6-Post: 3.62 L
FEV6-Pre: 3.54 L
FEV6FVC-%Pred-Post: 100 %
FEV6FVC-%Pred-Pre: 100 %
FVC-%Change-Post: 2 %
FVC-%Pred-Post: 82 %
FVC-%Pred-Pre: 81 %
FVC-Post: 3.62 L
FVC-Pre: 3.54 L
Post FEV1/FVC ratio: 92 %
Post FEV6/FVC ratio: 100 %
Pre FEV1/FVC ratio: 89 %
Pre FEV6/FVC Ratio: 100 %
RV % pred: 50 %
RV: 0.85 L
TLC % pred: 70 %
TLC: 4.53 L

## 2021-06-25 MED ORDER — CYCLOBENZAPRINE HCL 10 MG PO TABS: 10.0000 mg | ORAL_TABLET | Freq: Three times a day (TID) | ORAL | 0 refills | Status: DC | PRN

## 2021-06-25 NOTE — Progress Notes (Signed)
PFT done today. 

## 2021-06-25 NOTE — Progress Notes (Signed)
SUBJECTIVE:  25 y.o. female complains of vaginal itching and odorless vaginal discharge for 7 day(s). Denies abnormal vaginal bleeding or significant pelvic pain or fever. No UTI symptoms. Denies history of known exposure to STD.  Patient's last menstrual period was 05/30/2021 (approximate).  OBJECTIVE:  She appears well, afebrile. Urine dipstick: not done.  ASSESSMENT:  Vaginal Discharge  Vaginal Itching   PLAN:  GC, chlamydia, trichomonas, BVAG, CVAG probe sent to lab. Treatment: To be determined once lab results are received ROV prn if symptoms persist or worsen.

## 2021-06-26 ENCOUNTER — Institutional Professional Consult (permissible substitution): Payer: Medicaid Other | Admitting: Student

## 2021-06-26 LAB — CERVICOVAGINAL ANCILLARY ONLY
Bacterial Vaginitis (gardnerella): POSITIVE — AB
Candida Glabrata: NEGATIVE
Candida Vaginitis: NEGATIVE
Chlamydia: NEGATIVE
Comment: NEGATIVE
Comment: NEGATIVE
Comment: NEGATIVE
Comment: NEGATIVE
Comment: NEGATIVE
Comment: NORMAL
Neisseria Gonorrhea: NEGATIVE
Trichomonas: NEGATIVE

## 2021-06-27 ENCOUNTER — Other Ambulatory Visit: Payer: Self-pay | Admitting: Obstetrics

## 2021-06-27 DIAGNOSIS — B9689 Other specified bacterial agents as the cause of diseases classified elsewhere: Secondary | ICD-10-CM

## 2021-06-27 DIAGNOSIS — M542 Cervicalgia: Secondary | ICD-10-CM | POA: Diagnosis not present

## 2021-06-27 DIAGNOSIS — M419 Scoliosis, unspecified: Secondary | ICD-10-CM | POA: Diagnosis not present

## 2021-06-27 DIAGNOSIS — Z79899 Other long term (current) drug therapy: Secondary | ICD-10-CM | POA: Diagnosis not present

## 2021-06-27 DIAGNOSIS — M25571 Pain in right ankle and joints of right foot: Secondary | ICD-10-CM | POA: Diagnosis not present

## 2021-06-27 DIAGNOSIS — N915 Oligomenorrhea, unspecified: Secondary | ICD-10-CM | POA: Diagnosis not present

## 2021-06-27 DIAGNOSIS — N760 Acute vaginitis: Secondary | ICD-10-CM

## 2021-06-27 DIAGNOSIS — Z6841 Body Mass Index (BMI) 40.0 and over, adult: Secondary | ICD-10-CM | POA: Diagnosis not present

## 2021-06-27 DIAGNOSIS — M545 Low back pain, unspecified: Secondary | ICD-10-CM | POA: Diagnosis not present

## 2021-06-27 MED ORDER — METRONIDAZOLE 500 MG PO TABS: 500.0000 mg | ORAL_TABLET | Freq: Two times a day (BID) | ORAL | 2 refills | Status: DC

## 2021-06-28 ENCOUNTER — Telehealth: Payer: Self-pay | Admitting: Student

## 2021-06-28 DIAGNOSIS — R0609 Other forms of dyspnea: Secondary | ICD-10-CM

## 2021-06-28 DIAGNOSIS — R0600 Dyspnea, unspecified: Secondary | ICD-10-CM

## 2021-06-28 NOTE — Telephone Encounter (Signed)
Called and discussed PFTs which don't show evidence of asthma even with her off of advair and albuterol. I think we need to direct all our efforts at supporting her weight loss. I did recommend obtaining methacholine challenge and encouraged her to stay off of advair for at least a week prior to methacholine challenge. We will also obtain TTE. All questions addressed.   [place]

## 2021-07-01 ENCOUNTER — Encounter: Payer: Self-pay | Admitting: Adult Health

## 2021-07-01 ENCOUNTER — Ambulatory Visit: Payer: Medicaid Other | Admitting: Adult Health

## 2021-07-01 DIAGNOSIS — Z79899 Other long term (current) drug therapy: Secondary | ICD-10-CM | POA: Diagnosis not present

## 2021-07-03 ENCOUNTER — Other Ambulatory Visit: Payer: Self-pay

## 2021-07-04 ENCOUNTER — Ambulatory Visit: Payer: Medicaid Other | Admitting: Adult Health

## 2021-07-10 ENCOUNTER — Other Ambulatory Visit: Payer: Self-pay

## 2021-07-10 DIAGNOSIS — Z1152 Encounter for screening for COVID-19: Secondary | ICD-10-CM | POA: Diagnosis not present

## 2021-07-11 ENCOUNTER — Encounter: Payer: Self-pay | Admitting: Adult Health

## 2021-07-11 ENCOUNTER — Ambulatory Visit: Payer: Medicaid Other | Admitting: Adult Health

## 2021-07-11 VITALS — BP 138/86 | HR 86 | Temp 98.5°F | Ht 74.0 in | Wt >= 6400 oz

## 2021-07-11 DIAGNOSIS — M5412 Radiculopathy, cervical region: Secondary | ICD-10-CM | POA: Diagnosis not present

## 2021-07-11 NOTE — Progress Notes (Signed)
Subjective:    Patient ID: Maureen Lewis, female    DOB: 18-Dec-1995, 25 y.o.   MRN: [id number]  HPI 25 year old female who  has a past medical history of Abnormal uterine bleeding, Acid reflux, Alpha thalassemia trait, Amenorrhea, Anemia, Asthma, Back pain, Cesarean delivery delivered (10/11/2019), Chest pain, Complication of anesthesia, Constipation, Depression, Fibromyalgia, Finger mass, left (03/2017), Gestational diabetes (05/23/2019), Gestational hypertension (09/23/2019), Headache, Hypertension, Infection, Irritable bowel syndrome (IBS), Joint pain, Morbid obesity with body mass index (BMI) of 45.0 to 49.9 in adult [facility]), Obesity during pregnancy, antepartum (04/06/2019), Plantar fasciitis, bilateral, Polycystic ovary syndrome, Pre-diabetes, Prediabetes, Pregnancy induced hypertension, Shortness of breath, Sleep apnea, Supervision of normal first pregnancy (04/06/2019), and Vertigo (2017).  She presents to the office today for numbness and tingling in bilateral arms. This has been going on for about a month. Gabapentin, Prednisone, and muscle relaxer's have not helped. Numbness and tingling starts in the upper back and " shoots" down both arms into her fingers. She feels as though she is losing grip strength.   She sometimes has this feeling in her lower back and both legs.   Cervical and lumbar spine xrays from March 2022 were without acute abnormality.   She has had this before and dry needling/PT worked for her     [facility] Readings from Last 3 Encounters:  07/11/21 (!) 478 lb (216.8 kg)  06/25/21 (!) 482 lb (218.6 kg)  06/13/21 (!) 472 lb (214.1 kg)    Review of Systems See HPI   Past Medical History:  Diagnosis Date   Abnormal uterine bleeding    Acid reflux    Alpha thalassemia trait    Amenorrhea    Anemia    no current med.   Asthma    Back pain    Cesarean delivery delivered 10/11/2019   10/09/2019 - primary CS for failed IOL   Chest pain    resolved    Complication of anesthesia    states had to keep giving her anesthesia during EGD   Constipation    Depression    "I'm good"   Fibromyalgia    Finger mass, left 03/2017   middle finger   Gestational diabetes 05/23/2019   hx - with pregnancy only    Gestational hypertension 09/23/2019   Guidelines for Antenatal Testing and Sonography  (with updated ICD-10 codes)  Updated  06-Oct-2019 with Dr. Tama High  INDICATION U/S 2 X week NST/AFI  or full BPP wkly DELIVERY Diabetes   A1 - good control - O24.410    A2 - good control - O24.419      A2  - poor control or poor compliance - O24.419, E11.65   (Macrosomia or polyhydramnios) **E11.65 is extra code for poor control**    A2/B - O24.   Headache    Hypertension    Infection    UTI   Irritable bowel syndrome (IBS)    Joint pain    Morbid obesity with body mass index (BMI) of 45.0 to 49.9 in adult [facility])    Obesity during pregnancy, antepartum 04/06/2019   Body mass index is 53.71 kg/m.  Recommendations [x]  Aspirin 81 mg daily after 12 weeks; discontinue after 36 weeks [ ]  Nutrition consult [ ]  Weight gain 11-20 lbs for singleton and 25-35 lbs for twin pregnancy (IOM guidelines) Higher class of obesity patients recommended to gain closer to lower limit  Weight loss is associated with adverse outcomes [ ]  Baseline and surveillance labs (pulled  in fr   Plantar fasciitis, bilateral    resolved   Polycystic ovary syndrome    Pre-diabetes    no meds, diet controlled, diet not check blood sugar   Prediabetes    "prediabetes"   Pregnancy induced hypertension    Shortness of breath    albuterol inhaler   Sleep apnea    no CPAP use   Supervision of normal first pregnancy 04/06/2019   [name]: [ x]Initial [ x]12 [ ] 20 [ ] 28 [ ] 32 [ ] 36 [ ] 38 [ ] 39 [ ] 40 Nursing Staff Provider Office Location [facility]  Dating   LMP Language  English  Anatomy US  Nml Flu Vaccine  Declined 07/28/19 Genetic Screen  NIPS: low risks   AFP:   negative  TDaP  vaccine   08/25/19 Hgb A1C or  GTT Early A1C 5.8 Third trimester: GDM insulin Rhogam  NA   LAB RESULTS  Feeding Plan Breast Blood Type   Vertigo 2017    Social History   Socioeconomic History   Marital status: Single    Spouse name: Not on file   Number of children: Not on file   Years of education: Not on file   Highest education level: Not on file  Occupational History   Occupation: Chemical engineer    Employer: [id number]  Tobacco Use   Smoking status: Never   Smokeless tobacco: Never  Vaping Use   Vaping Use: Never used  Substance and Sexual Activity   Alcohol use: Not Currently    Comment: social    Drug use: No   Sexual activity: Yes    Birth control/protection: Condom  Other Topics Concern   Not on file  Social History Narrative   Right handed    Soda sometimes   Lives with mom and cousin, a grandmother in [place] also helps care for her.       She is in nursing school.    Social Determinants of Health   Financial Resource Strain: Not on file  Food Insecurity: Not on file  Transportation Needs: Not on file  Physical Activity: Not on file  Stress: Not on file  Social Connections: Not on file  Intimate Partner Violence: Not on file    Past Surgical History:  Procedure Laterality Date   CESAREAN SECTION N/A 10/09/2019   Procedure: CESAREAN SECTION;  Surgeon: Aletha Halim, MD;  Location: [facility];  Service: Obstetrics;  Laterality: N/A;   COLONOSCOPY WITH PROPOFOL  10/07/2016   DILATION AND CURETTAGE OF UTERUS     ESOPHAGOGASTRODUODENOSCOPY  12/21/2015   EXCISION MASS UPPER EXTREMETIES Left 04/21/2017   Procedure: EXCISION MASS LEFT MIDDLE FINGER;  Surgeon: Daryll Brod, MD;  Location: [place];  Service: Orthopedics;  Laterality: Left;   IR FL GUIDED LOC OF NEEDLE/CATH TIP FOR SPINAL INJECTION RT  03/13/2020   PHOTOCOAGULATION WITH LASER Left 12/13/2020   Procedure: RETINOPLEXY LEFT EYE;  Surgeon: Sherlynn Stalls, MD;  Location: [place];   Service: Ophthalmology;  Laterality: Left;   SCLERAL BUCKLE Right 12/13/2020   Procedure: SCLERAL BUCKLE WITH CRYO;  Surgeon: Sherlynn Stalls, MD;  Location: [place];  Service: Ophthalmology;  Laterality: Right;    Family History  Problem Relation Age of Onset   Diabetes Maternal Aunt    Hypertension Maternal Uncle    Depression Maternal Grandfather    Diabetes Maternal Grandfather    Hypertension Maternal Grandfather    Arthritis Mother    High blood pressure Mother    Depression  Mother    Sleep apnea Mother    Obesity Mother    Diabetes Mother    Hypertension Mother    Kidney failure Mother    Sleep apnea Father    Obesity Father    Healthy Daughter     Allergies  Allergen Reactions   Bee Pollen Hives, Shortness Of Breath and Swelling   Bee Venom Hives, Shortness Of Breath and Swelling   Hydrocodone-Acetaminophen Anaphylaxis    No problem when she takes Tylenol   Peach Flavor Hives, Shortness Of Breath and Other (See Comments)    SWELLING OF MOUTH   Pollen Extract Hives, Shortness Of Breath and Swelling   Remdesivir Swelling    Angioedema 8/25   Cortisone Itching and Swelling   Cortizone-10 [Hydrocortisone]     Swelling and itching    Current Outpatient Medications on File Prior to Visit  Medication Sig Dispense Refill   albuterol (VENTOLIN HFA) 108 (90 Base) MCG/ACT inhaler Inhale 2 puffs into the lungs every 6 (six) hours as needed for wheezing or shortness of breath. 8 g 0   amLODipine (NORVASC) 10 MG tablet Take 1 tablet (10 mg total) by mouth daily. 90 tablet 3   benzonatate (TESSALON) 200 MG capsule Take 1 capsule (200 mg total) by mouth 3 (three) times daily as needed for cough. 20 capsule 0   Blood Pressure Monitoring (BLOOD PRESSURE MON/AUTO/WRIST) DEVI Use to monitor blood pressure 1 each 0   cyclobenzaprine (FLEXERIL) 10 MG tablet Take 1 tablet (10 mg total) by mouth 3 (three) times daily as needed for muscle spasms. 30 tablet 0   diclofenac (VOLTAREN) 75 MG  EC tablet Take 1 tablet (75 mg total) by mouth 2 (two) times daily. (Patient taking differently: Take 75 mg by mouth 2 (two) times daily as needed (pain).) 60 tablet 1   Dulaglutide (TRULICITY) 7.89 FY/1.0FB SOPN Inject 0.75 mg into the skin every Wednesday.     ferrous sulfate 325 (65 FE) MG tablet Take 1 tablet (325 mg total) by mouth every other day. 45 tablet 1   fluticasone (FLONASE) 50 MCG/ACT nasal spray Place 1 spray into both nostrils daily. 16 g 2   fluticasone-salmeterol (ADVAIR HFA) 115-21 MCG/ACT inhaler Inhale 2 puffs into the lungs 2 (two) times daily. 3 each 3   gabapentin (NEURONTIN) 600 MG tablet Take 600 mg by mouth 3 (three) times daily.     hydrochlorothiazide (HYDRODIURIL) 25 MG tablet Take 1 tablet (25 mg total) by mouth daily. 90 tablet 3   ibuprofen (ADVIL) 600 MG tablet Take 1 tablet (600 mg total) by mouth every 6 (six) hours as needed. 30 tablet 0   loratadine (CLARITIN) 10 MG tablet Take 10 mg by mouth daily as needed for allergies.      metroNIDAZOLE (FLAGYL) 500 MG tablet Take 1 tablet (500 mg total) by mouth 2 (two) times daily. 14 tablet 2   naproxen (NAPROSYN) 500 MG tablet Take 1 tablet (500 mg total) by mouth 2 (two) times daily with a meal. (Patient taking differently: Take 500 mg by mouth 2 (two) times daily as needed (pain).) 30 tablet 0   oxyCODONE-acetaminophen (PERCOCET/ROXICET) 5-325 MG tablet Take 1 tablet by mouth 5 (five) times daily.     pantoprazole (PROTONIX) 40 MG tablet Take 1 tablet (40 mg total) by mouth daily. 90 tablet 0   polyethylene glycol powder (GLYCOLAX/MIRALAX) 17 GM/SCOOP powder Take 17 g by mouth daily as needed for moderate constipation.      Vitamin D,  Ergocalciferol, (DRISDOL) 1.25 MG (50000 UNIT) CAPS capsule Take 1 capsule (50,000 Units total) by mouth every 7 (seven) days. (Patient taking differently: Take 50,000 Units by mouth every Monday.) 4 capsule 0   No current facility-administered medications on file prior to visit.     BP 138/86   Pulse 86   Temp 98.5 F (36.9 C) (Oral)   Ht 6\' 2"  (1.88 m)   Wt (!) 478 lb (216.8 kg)   SpO2 93%   BMI 61.37 kg/m      Objective:   Physical Exam Vitals and nursing note reviewed.  Constitutional:      Appearance: Normal appearance.  Cardiovascular:     Rate and Rhythm: Regular rhythm.  Musculoskeletal:        General: Normal range of motion.     Cervical back: Tenderness and bony tenderness present. Pain with movement present.     Comments: Palpation throughout cervical spine caused worsening numbness and tingling down her arms.   Skin:    General: Skin is warm and dry.     Capillary Refill: Capillary refill takes less than 2 seconds.  Neurological:     General: No focal deficit present.     Mental Status: She is alert and oriented to person, place, and time.     Cranial Nerves: Cranial nerves are intact.     Sensory: Sensation is intact.     Motor: Motor function is intact.     Comments: 4/5 grip strength bilaterally.   Psychiatric:        Mood and Affect: Mood normal.        Behavior: Behavior normal.        Thought Content: Thought content normal.        Judgment: Judgment normal.       Assessment & Plan:  1. Cervical radiculopathy - Ambulatory referral to Physical Therapy - Follow up in one month or sooner if no improvement   Dorothyann Peng, NP

## 2021-07-15 DIAGNOSIS — I10 Essential (primary) hypertension: Secondary | ICD-10-CM | POA: Diagnosis not present

## 2021-07-15 DIAGNOSIS — E282 Polycystic ovarian syndrome: Secondary | ICD-10-CM | POA: Diagnosis not present

## 2021-07-15 DIAGNOSIS — J4520 Mild intermittent asthma, uncomplicated: Secondary | ICD-10-CM | POA: Diagnosis not present

## 2021-07-15 DIAGNOSIS — Z794 Long term (current) use of insulin: Secondary | ICD-10-CM | POA: Diagnosis not present

## 2021-07-15 DIAGNOSIS — G4733 Obstructive sleep apnea (adult) (pediatric): Secondary | ICD-10-CM | POA: Diagnosis not present

## 2021-07-15 DIAGNOSIS — E559 Vitamin D deficiency, unspecified: Secondary | ICD-10-CM | POA: Diagnosis not present

## 2021-07-15 DIAGNOSIS — E119 Type 2 diabetes mellitus without complications: Secondary | ICD-10-CM | POA: Diagnosis not present

## 2021-07-15 DIAGNOSIS — G932 Benign intracranial hypertension: Secondary | ICD-10-CM | POA: Diagnosis not present

## 2021-07-15 DIAGNOSIS — E669 Obesity, unspecified: Secondary | ICD-10-CM | POA: Diagnosis not present

## 2021-07-15 DIAGNOSIS — Z6841 Body Mass Index (BMI) 40.0 and over, adult: Secondary | ICD-10-CM | POA: Diagnosis not present

## 2021-07-18 DIAGNOSIS — R079 Chest pain, unspecified: Secondary | ICD-10-CM | POA: Diagnosis not present

## 2021-07-18 DIAGNOSIS — R0602 Shortness of breath: Secondary | ICD-10-CM | POA: Diagnosis not present

## 2021-07-18 DIAGNOSIS — R002 Palpitations: Secondary | ICD-10-CM | POA: Diagnosis not present

## 2021-07-18 DIAGNOSIS — I10 Essential (primary) hypertension: Secondary | ICD-10-CM | POA: Diagnosis not present

## 2021-07-18 DIAGNOSIS — R42 Dizziness and giddiness: Secondary | ICD-10-CM | POA: Diagnosis not present

## 2021-07-24 ENCOUNTER — Ambulatory Visit (HOSPITAL_COMMUNITY)
Admission: RE | Admit: 2021-07-24 | Discharge: 2021-07-24 | Disposition: A | Discharge status: Home / Self Care | Payer: Medicaid Other | Source: Ambulatory Visit | Attending: Student | Admitting: Student

## 2021-07-24 ENCOUNTER — Other Ambulatory Visit: Payer: Self-pay

## 2021-07-24 DIAGNOSIS — R0609 Other forms of dyspnea: Secondary | ICD-10-CM | POA: Insufficient documentation

## 2021-07-24 DIAGNOSIS — I10 Essential (primary) hypertension: Secondary | ICD-10-CM | POA: Diagnosis not present

## 2021-07-24 DIAGNOSIS — R0602 Shortness of breath: Secondary | ICD-10-CM | POA: Insufficient documentation

## 2021-07-24 LAB — ECHOCARDIOGRAM COMPLETE
Area-P 1/2: 2.09 cm2
S' Lateral: 3.6 cm

## 2021-07-24 NOTE — Progress Notes (Signed)
  Echocardiogram 2D Echocardiogram has been performed.  Darlina Sicilian M 07/24/2021, 1:34 PM

## 2021-07-25 ENCOUNTER — Ambulatory Visit: Payer: Medicaid Other

## 2021-07-25 DIAGNOSIS — Z1152 Encounter for screening for COVID-19: Secondary | ICD-10-CM | POA: Diagnosis not present

## 2021-07-26 DIAGNOSIS — M542 Cervicalgia: Secondary | ICD-10-CM | POA: Diagnosis not present

## 2021-07-26 DIAGNOSIS — Z6841 Body Mass Index (BMI) 40.0 and over, adult: Secondary | ICD-10-CM | POA: Diagnosis not present

## 2021-07-26 DIAGNOSIS — Z79899 Other long term (current) drug therapy: Secondary | ICD-10-CM | POA: Diagnosis not present

## 2021-07-26 DIAGNOSIS — N915 Oligomenorrhea, unspecified: Secondary | ICD-10-CM | POA: Diagnosis not present

## 2021-07-26 DIAGNOSIS — M545 Low back pain, unspecified: Secondary | ICD-10-CM | POA: Diagnosis not present

## 2021-07-26 DIAGNOSIS — M419 Scoliosis, unspecified: Secondary | ICD-10-CM | POA: Diagnosis not present

## 2021-07-26 DIAGNOSIS — M25571 Pain in right ankle and joints of right foot: Secondary | ICD-10-CM | POA: Diagnosis not present

## 2021-07-29 DIAGNOSIS — Z79899 Other long term (current) drug therapy: Secondary | ICD-10-CM | POA: Diagnosis not present

## 2021-08-06 ENCOUNTER — Ambulatory Visit: Payer: Medicaid Other | Attending: Adult Health

## 2021-08-06 ENCOUNTER — Other Ambulatory Visit: Payer: Self-pay

## 2021-08-06 DIAGNOSIS — M6281 Muscle weakness (generalized): Secondary | ICD-10-CM | POA: Insufficient documentation

## 2021-08-06 DIAGNOSIS — R293 Abnormal posture: Secondary | ICD-10-CM | POA: Diagnosis not present

## 2021-08-06 DIAGNOSIS — M542 Cervicalgia: Secondary | ICD-10-CM | POA: Insufficient documentation

## 2021-08-07 ENCOUNTER — Other Ambulatory Visit: Payer: Self-pay

## 2021-08-07 NOTE — Therapy (Addendum)
[place], [place], [address] Phone: [phone]   Fax:  [phone]  Physical Therapy Evaluation  PHYSICAL THERAPY DISCHARGE SUMMARY  Visits from Start of Care: 1  Current functional level related to goals / functional outcomes: Goals not assessed    Remaining deficits: Status unknown   Education / Equipment: N/a   Patient agrees to discharge. Patient goals were not met. Patient is being discharged due to not returning since the last visit.   Patient Details  Name: Maureen Lewis MRN: [id number] Date of Birth: [birth year] Referring Provider (PT): Dorothyann Peng, NP   Encounter Date: 08/06/2021   PT End of Session - 08/06/21 1615     Visit Number 1    Number of Visits 13    Date for PT Re-Evaluation 09/21/21    Authorization Type Medicaid [employer]- requesting auth    PT Start Time 1615    PT Stop Time 1700    PT Time Calculation (min) 45 min    Activity Tolerance Other (comment)   nausea and vomiting elicited during evaluation   Behavior During Therapy Flat affect             Past Medical History:  Diagnosis Date   Abnormal uterine bleeding    Acid reflux    Alpha thalassemia trait    Amenorrhea    Anemia    no current med.   Asthma    Back pain    Cesarean delivery delivered 10/11/2019   10/09/2019 - primary CS for failed IOL   Chest pain    resolved   Complication of anesthesia    states had to keep giving her anesthesia during EGD   Constipation    Depression    "I'm good"   Fibromyalgia    Finger mass, left 03/2017   middle finger   Gestational diabetes 05/23/2019   hx - with pregnancy only    Gestational hypertension 09/23/2019   Guidelines for Antenatal Testing and Sonography  (with updated ICD-10 codes)  Updated  04-Oct-2019 with Dr. Tama High  INDICATION U/S 2 X week NST/AFI  or full BPP wkly DELIVERY Diabetes   A1 - good control - O24.410    A2 -  good control - O24.419      A2  - poor control or poor compliance - O24.419, E11.65   (Macrosomia or polyhydramnios) **E11.65 is extra code for poor control**    A2/B - O24.   Headache    Hypertension    Infection    UTI   Irritable bowel syndrome (IBS)    Joint pain    Morbid obesity with body mass index (BMI) of 45.0 to 49.9 in adult [facility])    Obesity during pregnancy, antepartum 04/06/2019   Body mass index is 53.71 kg/m.  Recommendations _0  Aspirin 81 mg daily after 12 weeks; discontinue after 36 weeks _1  Nutrition consult _2  Weight gain 11-20 lbs for singleton and 25-35 lbs for twin pregnancy (IOM guidelines) Higher class of obesity patients recommended to gain closer to lower limit  Weight loss is associated with adverse outcomes _3  Baseline and surveillance labs (pulled in fr   Plantar fasciitis, bilateral    resolved   Polycystic ovary syndrome    Pre-diabetes    no meds, diet controlled, diet not check blood sugar   Prediabetes    "prediabetes"   Pregnancy induced hypertension    Shortness of breath    albuterol  inhaler   Sleep apnea    no CPAP use   Supervision of normal first pregnancy 04/06/2019   [name]: [ x]Initial [ x]12 _0 20 _1 28 _2 32 _3 36 _4 38 _5 39 _6 40 Nursing Staff Provider Office Location [facility]  Dating   LMP Language  English  Anatomy US  Nml Flu Vaccine  Declined 07/28/19 Genetic Screen  NIPS: low risks   AFP:   negative  TDaP vaccine   08/25/19 Hgb A1C or  GTT Early A1C 5.8 Third trimester: GDM insulin Rhogam  NA   LAB RESULTS  Feeding Plan Breast Blood Type   Vertigo 2017    Past Surgical History:  Procedure Laterality Date   CESAREAN SECTION N/A 10/09/2019   Procedure: CESAREAN SECTION;  Surgeon: Aletha Halim, MD;  Location: [facility];  Service: Obstetrics;  Laterality: N/A;   COLONOSCOPY WITH PROPOFOL  10/07/2016   DILATION AND CURETTAGE OF UTERUS     ESOPHAGOGASTRODUODENOSCOPY  12/21/2015   EXCISION MASS UPPER EXTREMETIES Left  04/21/2017   Procedure: EXCISION MASS LEFT MIDDLE FINGER;  Surgeon: Daryll Brod, MD;  Location: [place];  Service: Orthopedics;  Laterality: Left;   IR FL GUIDED LOC OF NEEDLE/CATH TIP FOR SPINAL INJECTION RT  03/13/2020   PHOTOCOAGULATION WITH LASER Left 12/13/2020   Procedure: RETINOPLEXY LEFT EYE;  Surgeon: Sherlynn Stalls, MD;  Location: [place];  Service: Ophthalmology;  Laterality: Left;   SCLERAL BUCKLE Right 12/13/2020   Procedure: SCLERAL BUCKLE WITH CRYO;  Surgeon: Sherlynn Stalls, MD;  Location: [place];  Service: Ophthalmology;  Laterality: Right;    There were no vitals filed for this visit.    Subjective Assessment - 08/06/21 1615     Subjective Patient reports neck pain that has been ongoing for a year, but has recently worsened over the past month without known cause. She reports the pain is from the base of her posterior neck to her shoulders. She also reports headaches that started about 3 weeks ago that can be along the temples or base of the occiput that are happening daily and lasting for hours that then can worsen her neck pain. She has done PT for neck pain previously that did help "somewhat" reporting that dry needling helped, but did not notice "an extreme change." She reports tingling about BUE that stops at the dorsal aspect of the hand and numbness that can course along the palmar and dorsal aspect of all fingers. She is unsure what causes the numbness/tingling. No changes in bowel/bladder. She reports dizziness that does coincide with her headaches currently. She reports having a history of vertigo. She is also beginning her "bariatric journey" having met with one surgeon and has another consult with a different surgeon in December.    Pertinent History acid reflux, alpha thalassemia trait, anemia, back pain, depression, gestational diabetes, HA, HTN, UTI, IBS, obesity, bilat plantar fasciitis, OSA, vertigo    Currently in Pain? Yes    Pain Score 8     Pain  Location Neck    Pain Orientation Posterior    Pain Descriptors / Indicators Other (Comment)   pulling   Pain Type Chronic pain    Pain Radiating Towards BUE    Pain Onset More than a month ago    Pain Frequency Intermittent    Aggravating Factors  headaches, active with her arms/neck    Pain Relieving Factors rest  [facility] PT Assessment - 08/07/21 0001       Assessment   Medical Diagnosis M54.12 (ICD-10-CM) - Cervical radiculopathy    Referring Provider (PT) Nafziger, Tommi Rumps, NP    Onset Date/Surgical Date --   >1 year   Hand Dominance Right    Next MD Visit nothing scheduled    Prior Therapy yes      Precautions   Precautions None      Restrictions   Weight Bearing Restrictions No      Balance Screen   Has the patient fallen in the past 6 months Yes    How many times? 1   tripped down the stairs   Has the patient had a decrease in activity level because of a fear of falling?  Yes    Is the patient reluctant to leave their home because of a fear of falling?  No      Home Environment   Living Environment Private residence    Living Arrangements Children;Parent    Type of Home House      Prior Function   Level of Independence --   requires assistance with lifting/carrying her daughter and giving her a bath   Vocation On disability    Vocation Requirements short term disability, works at [employer] with her daughter      Cognition   Overall Cognitive Status Within Functional Limits for tasks assessed      Observation/Other Assessments   Other Surveys  Neck Disability Index    Neck Disability Index  65% disability      Sensation   Light Touch Appears Intact      Posture/Postural Control   Posture/Postural Control Postural limitations    Postural Limitations Rounded Shoulders;Forward head      AROM   Cervical Flexion 45   pain posterior neck   Cervical Extension 40    Cervical - Right Side Bend 40    Cervical - Left Side Bend 40     Cervical - Right Rotation 70    Cervical - Left Rotation 53   pain left side of posterior neck     Strength   Overall Strength Comments Myotomal strength 4+/5 bilaterally    Right Hand Grip (lbs) 30    Left Hand Grip (lbs) 35      Palpation   Spinal mobility C-spine hypomobility with pain and onset of light-headedness, dizziness,nausea and vomiting with lower C-spine PAIVM      Special Tests   Other special tests (-) Hoffman's (-) Cervical compression (-) Cervical Distraction (-) vertebral artery testing; smooth pursuit and horziontal and vertical saccades are normal with no nystagmus noted                        Objective measurements completed on examination: See above findings.       [facility] Adult PT Treatment/Exercise - 08/07/21 0001       Self-Care   Other Self-Care Comments  see patient education                     PT Education - 08/06/21 1741     Education Details education on assessment findings and recommendation to f/u with referring provider before continuing with PT given abnormal response to PAIVM assessment.    Person(s) Educated Patient    Methods Explanation    Comprehension Verbalized understanding  PT Short Term Goals - 08/07/21 0911       PT SHORT TERM GOAL #1   Title STG=LTG               PT Long Term Goals - 08/07/21 0911       PT LONG TERM GOAL #1   Title Patient will be independent with advanced HEP to manage her chronic conditions.    Status New    Target Date 09/18/21      PT LONG TERM GOAL #2   Title Patient will demonstrate at least 65 degrees of Lt cervical rotation AROM to improve ability to scan her environment when playing with her daughter.    Baseline see flowsheet    Status New    Target Date 09/18/21      PT LONG TERM GOAL #3   Title Patient will demonstrate at least 40 lbs for Rt grip strength to improve ability to lift/carry objects.    Baseline see flowsheet    Status  New    Target Date 09/18/21      PT LONG TERM GOAL #4   Title Patient will report pain at worst rated as 5/10 to improve her ability to tolerate playtime with her daughter.    Baseline 8/10    Status New    Target Date 09/18/21      PT LONG TERM GOAL #5   Title Patient will score <50% disability on the NDI to signify clinically meaningful improvement in her functional abiltiies.    Baseline 65%    Status New    Target Date 09/18/21                    Plan - 08/06/21 1708     Clinical Impression Statement Maureen Lewis is a well known patient of our clinic returning to [place] with chief compliant of chronic neck pain and headaches that have recently worsened over the past month without known cause. Upon assessment she is noted to have normal dermatomal and myotomal assessment. She has normal C-spine AROM with exception of left rotation, which is limited. She reports pain with cervical flexion and left rotation about the posterior neck. She has tautness and palpable tenderness about cervical musculature. When performing PAIVM of the cervical spine patient reports onset of dizziness,light-headedness, and nausea with vomiting rapidly elicited. Her symptoms did resolve during the evaluation with further assessment revealing a negative vertebral artery screening as well as no nystagmus noted with smooth pursuit or saccades. Given her rapid, abnormal response to spinal mobility assessment she requires further evaluation from the referring provider before continuing with skilled PT. Once cleared by provider she will benefit from skilled PT to address the above stated deficits in order to optimize her function.    Personal Factors and Comorbidities Age;Comorbidity 3+;Fitness;Profession;Time since onset of injury/illness/exacerbation    Comorbidities alpha thalassemia trait, anemia, back pain, depression, gestational diabetes, obesity, bilat plantar fasciitis, OSA, vertigo    Examination-Activity  Limitations Bend;Lift;Carry;Reach Overhead    Examination-Participation Restrictions Occupation    Stability/Clinical Decision Making Evolving/Moderate complexity    Clinical Decision Making Moderate    Rehab Potential Fair    PT Frequency 2x / week    PT Duration 6 weeks    PT Treatment/Interventions Moist Heat;Functional mobility training;Therapeutic activities;Therapeutic exercise;Balance training;Manual techniques;Patient/family education;Passive range of motion;Dry needling;Taping;ADLs/Self Care Home Management;Cryotherapy;Neuromuscular re-education;Traction;Spinal Manipulations    PT Next Visit Plan posture education, periscapular strengthening    PT Home Exercise Plan did not issue;  PT on hold until f/u with referring provider    Recommended Other Services f/u with referring provider due to abnormal evaluation findings    Consulted and Agree with Plan of Care Patient             Patient will benefit from skilled therapeutic intervention in order to improve the following deficits and impairments:  Decreased range of motion, Increased fascial restricitons, Obesity, Impaired UE functional use, Dizziness, Decreased activity tolerance, Pain, Impaired flexibility, Hypomobility, Postural dysfunction, Decreased strength  Visit Diagnosis: Cervicalgia  Abnormal posture  Muscle weakness (generalized)     Problem List Patient Active Problem List   Diagnosis Date Noted   IUD check up 05/15/2021   Encounter for insertion of mirena IUD 04/01/2021   Levator spasm 11/21/2020   Galactorrhea 11/19/2020   Hx of migraine headaches 04/09/2020   Contraceptive management 11/22/2019   Positive RPR test 10/09/2019   HTN (hypertension) 09/23/2019   Migraine headache 07/12/2019   Alpha thalassemia trait    Vitamin D deficiency 09/16/2017   Prediabetes 09/16/2017   Depression 09/16/2017   Class 3 severe obesity with serious comorbidity and body mass index (BMI) of 50.0 to 59.9 in adult ([place])  09/16/2017   PCOS (polycystic ovarian syndrome) 08/13/2017   Mass 05/06/2017   Vertigo 10/06/2016   Microcytic anemia 09/26/2016   Pseudotumor cerebri syndrome 08/28/2016   Obesity hypoventilation syndrome ([place]) 08/28/2016   Benign paroxysmal positional vertigo due to bilateral vestibular disorder 08/28/2016   OSA (obstructive sleep apnea) 08/28/2016   Hypersomnia with sleep apnea 08/28/2016   Female hirsutism 08/28/2016   GERD (gastroesophageal reflux disease) 02/24/2015   Constipation 10/02/2014   Gwendolyn Grant, PT, DPT, ATC 08/07/21 9:22 AM  Check all possible CPT codes: 35686- Therapeutic Exercise, [phone]- Neuro Re-education, 97140 - Manual Therapy, 97530 - Therapeutic Activities, 97535 - Self Care, and [phone] - Mechanical traction        [facility] [address] [place], [place], [address] Phone: [phone]   Fax:  [phone]  Name: Maureen Lewis MRN: [id number] Date of Birth: Jun 14, 1996  Gwendolyn Grant, PT, DPT, ATC 09/04/22 2:29 PM

## 2021-08-08 ENCOUNTER — Encounter: Payer: Self-pay | Admitting: Adult Health

## 2021-08-08 ENCOUNTER — Ambulatory Visit (INDEPENDENT_AMBULATORY_CARE_PROVIDER_SITE_OTHER): Payer: Medicaid Other | Admitting: Adult Health

## 2021-08-08 VITALS — BP 108/90 | HR 111 | Temp 98.3°F | Ht 74.0 in | Wt >= 6400 oz

## 2021-08-08 DIAGNOSIS — M5412 Radiculopathy, cervical region: Secondary | ICD-10-CM

## 2021-08-08 DIAGNOSIS — G4486 Cervicogenic headache: Secondary | ICD-10-CM

## 2021-08-08 MED ORDER — SUMATRIPTAN SUCCINATE 50 MG PO TABS
50.0000 mg | ORAL_TABLET | ORAL | 1 refills | Status: DC | PRN
Start: 2021-08-08 — End: 2022-05-23

## 2021-08-08 NOTE — Progress Notes (Signed)
Subjective:    Patient ID: Maureen Lewis, female    DOB: 29-Dec-1995, 25 y.o.   MRN: [id number]  HPI 25 year old female who  has a past medical history of Abnormal uterine bleeding, Acid reflux, Alpha thalassemia trait, Amenorrhea, Anemia, Asthma, Back pain, Cesarean delivery delivered (10/11/2019), Chest pain, Complication of anesthesia, Constipation, Depression, Fibromyalgia, Finger mass, left (03/2017), Gestational diabetes (05/23/2019), Gestational hypertension (09/23/2019), Headache, Hypertension, Infection, Irritable bowel syndrome (IBS), Joint pain, Morbid obesity with body mass index (BMI) of 45.0 to 49.9 in adult [facility]), Obesity during pregnancy, antepartum (04/06/2019), Plantar fasciitis, bilateral, Polycystic ovary syndrome, Pre-diabetes, Prediabetes, Pregnancy induced hypertension, Shortness of breath, Sleep apnea, Supervision of normal first pregnancy (04/06/2019), and Vertigo (2017).  She was being evaluated by physical therapy on 08/06/2021 when she had a abnormal response to passive accessory intervertebral motion assessment is this rapidly brought on lightheadedness, dizziness, nausea, vomiting.  Fortunately her symptoms resolved during the evaluation.  She had a negative vertebral artery screen and no nystagmus noted. She has had more frequent frontal and temporal headaches as of recently as well.   Cervical spine x-ray and March 2022 showed no acute abnormality.  She has tried various OTC medications for her headaches without resolution.   Denies fevers or chills.   Review of Systems See HPI   Past Medical History:  Diagnosis Date   Abnormal uterine bleeding    Acid reflux    Alpha thalassemia trait    Amenorrhea    Anemia    no current med.   Asthma    Back pain    Cesarean delivery delivered 10/11/2019   10/09/2019 - primary CS for failed IOL   Chest pain    resolved   Complication of anesthesia    states had to keep giving her anesthesia during EGD    Constipation    Depression    "I'm good"   Fibromyalgia    Finger mass, left 03/2017   middle finger   Gestational diabetes 05/23/2019   hx - with pregnancy only    Gestational hypertension 09/23/2019   Guidelines for Antenatal Testing and Sonography  (with updated ICD-10 codes)  Updated  25-Sep-2019 with Dr. Tama High  INDICATION U/S 2 X week NST/AFI  or full BPP wkly DELIVERY Diabetes   A1 - good control - O24.410    A2 - good control - O24.419      A2  - poor control or poor compliance - O24.419, E11.65   (Macrosomia or polyhydramnios) **E11.65 is extra code for poor control**    A2/B - O24.   Headache    Hypertension    Infection    UTI   Irritable bowel syndrome (IBS)    Joint pain    Morbid obesity with body mass index (BMI) of 45.0 to 49.9 in adult [facility])    Obesity during pregnancy, antepartum 04/06/2019   Body mass index is 53.71 kg/m.  Recommendations [x]  Aspirin 81 mg daily after 12 weeks; discontinue after 36 weeks [ ]  Nutrition consult [ ]  Weight gain 11-20 lbs for singleton and 25-35 lbs for twin pregnancy (IOM guidelines) Higher class of obesity patients recommended to gain closer to lower limit  Weight loss is associated with adverse outcomes [ ]  Baseline and surveillance labs (pulled in fr   Plantar fasciitis, bilateral    resolved   Polycystic ovary syndrome    Pre-diabetes    no meds, diet controlled, diet not check blood sugar   Prediabetes    "  prediabetes"   Pregnancy induced hypertension    Shortness of breath    albuterol inhaler   Sleep apnea    no CPAP use   Supervision of normal first pregnancy 04/06/2019   [name]: [ x]Initial [ x]12 [ ] 20 [ ] 28 [ ] 32 [ ] 36 [ ] 38 [ ] 39 [ ] 40 Nursing Staff Provider Office Location [facility]  Dating   LMP Language  English  Anatomy US  Nml Flu Vaccine  Declined 07/28/19 Genetic Screen  NIPS: low risks   AFP:   negative  TDaP vaccine   08/25/19 Hgb A1C or  GTT Early A1C 5.8 Third trimester: GDM insulin Rhogam   NA   LAB RESULTS  Feeding Plan Breast Blood Type   Vertigo 2017    Social History   Socioeconomic History   Marital status: Single    Spouse name: Not on file   Number of children: Not on file   Years of education: Not on file   Highest education level: Not on file  Occupational History   Occupation: Chemical engineer    Employer: [employer]  Tobacco Use   Smoking status: Never   Smokeless tobacco: Never  Vaping Use   Vaping Use: Never used  Substance and Sexual Activity   Alcohol use: Not Currently    Comment: social    Drug use: No   Sexual activity: Yes    Birth control/protection: Condom  Other Topics Concern   Not on file  Social History Narrative   Right handed    Soda sometimes   Lives with mom and cousin, a grandmother in [place] also helps care for her.       She is in nursing school.    Social Determinants of Health   Financial Resource Strain: Not on file  Food Insecurity: Not on file  Transportation Needs: Not on file  Physical Activity: Not on file  Stress: Not on file  Social Connections: Not on file  Intimate Partner Violence: Not on file    Past Surgical History:  Procedure Laterality Date   CESAREAN SECTION N/A 10/09/2019   Procedure: CESAREAN SECTION;  Surgeon: Aletha Halim, MD;  [facility];  Service: Obstetrics;  Laterality: N/A;   COLONOSCOPY WITH PROPOFOL  10/07/2016   DILATION AND CURETTAGE OF UTERUS     ESOPHAGOGASTRODUODENOSCOPY  12/21/2015   EXCISION MASS UPPER EXTREMETIES Left 04/21/2017   Procedure: EXCISION MASS LEFT MIDDLE FINGER;  Surgeon: Daryll Brod, MD;  Location: [place];  Service: Orthopedics;  Laterality: Left;   IR FL GUIDED LOC OF NEEDLE/CATH TIP FOR SPINAL INJECTION RT  03/13/2020   PHOTOCOAGULATION WITH LASER Left 12/13/2020   Procedure: RETINOPLEXY LEFT EYE;  Surgeon: Sherlynn Stalls, MD;  Location: [place];  Service: Ophthalmology;  Laterality: Left;   SCLERAL BUCKLE Right 12/13/2020    Procedure: SCLERAL BUCKLE WITH CRYO;  Surgeon: Sherlynn Stalls, MD;  Location: [place];  Service: Ophthalmology;  Laterality: Right;    Family History  Problem Relation Age of Onset   Diabetes Maternal Aunt    Hypertension Maternal Uncle    Depression Maternal Grandfather    Diabetes Maternal Grandfather    Hypertension Maternal Grandfather    Arthritis Mother    High blood pressure Mother    Depression Mother    Sleep apnea Mother    Obesity Mother    Diabetes Mother    Hypertension Mother    Kidney failure Mother    Sleep apnea Father    Obesity  Father    Healthy Daughter     Allergies  Allergen Reactions   Bee Pollen Hives, Shortness Of Breath and Swelling   Bee Venom Hives, Shortness Of Breath and Swelling   Hydrocodone-Acetaminophen Anaphylaxis    No problem when she takes Tylenol   Peach Flavor Hives, Shortness Of Breath and Other (See Comments)    SWELLING OF MOUTH   Pollen Extract Hives, Shortness Of Breath and Swelling   Remdesivir Swelling    Angioedema 8/25   Cortisone Itching and Swelling   Cortizone-10 [Hydrocortisone]     Swelling and itching    Current Outpatient Medications on File Prior to Visit  Medication Sig Dispense Refill   albuterol (VENTOLIN HFA) 108 (90 Base) MCG/ACT inhaler Inhale 2 puffs into the lungs every 6 (six) hours as needed for wheezing or shortness of breath. 8 g 0   amLODipine (NORVASC) 10 MG tablet Take 1 tablet (10 mg total) by mouth daily. 90 tablet 3   Blood Pressure Monitoring (BLOOD PRESSURE MON/AUTO/WRIST) DEVI Use to monitor blood pressure 1 each 0   diclofenac (VOLTAREN) 75 MG EC tablet Take 1 tablet (75 mg total) by mouth 2 (two) times daily. (Patient taking differently: Take 75 mg by mouth 2 (two) times daily as needed (pain).) 60 tablet 1   Dulaglutide (TRULICITY) 2.35 TI/1.4ER SOPN Inject 0.75 mg into the skin every Wednesday.     fluticasone (FLONASE) 50 MCG/ACT nasal spray Place 1 spray into both nostrils daily. 16 g 2    fluticasone-salmeterol (ADVAIR HFA) 115-21 MCG/ACT inhaler Inhale 2 puffs into the lungs 2 (two) times daily. 3 each 3   gabapentin (NEURONTIN) 600 MG tablet Take 600 mg by mouth 3 (three) times daily.     hydrochlorothiazide (HYDRODIURIL) 25 MG tablet Take 1 tablet (25 mg total) by mouth daily. 90 tablet 3   ibuprofen (ADVIL) 600 MG tablet Take 1 tablet (600 mg total) by mouth every 6 (six) hours as needed. 30 tablet 0   loratadine (CLARITIN) 10 MG tablet Take 10 mg by mouth daily as needed for allergies.      metroNIDAZOLE (FLAGYL) 500 MG tablet Take 1 tablet (500 mg total) by mouth 2 (two) times daily. 14 tablet 2   naproxen (NAPROSYN) 500 MG tablet Take 1 tablet (500 mg total) by mouth 2 (two) times daily with a meal. (Patient taking differently: Take 500 mg by mouth 2 (two) times daily as needed (pain).) 30 tablet 0   oxyCODONE-acetaminophen (PERCOCET/ROXICET) 5-325 MG tablet Take 1 tablet by mouth 5 (five) times daily.     pantoprazole (PROTONIX) 40 MG tablet Take 1 tablet (40 mg total) by mouth daily. 90 tablet 0   polyethylene glycol powder (GLYCOLAX/MIRALAX) 17 GM/SCOOP powder Take 17 g by mouth daily as needed for moderate constipation.      Vitamin D, Ergocalciferol, (DRISDOL) 1.25 MG (50000 UNIT) CAPS capsule Take 1 capsule (50,000 Units total) by mouth every 7 (seven) days. (Patient taking differently: Take 50,000 Units by mouth every Monday.) 4 capsule 0   ferrous sulfate 325 (65 FE) MG tablet Take 1 tablet (325 mg total) by mouth every other day. 45 tablet 1   No current facility-administered medications on file prior to visit.    BP 108/90   Pulse (!) 111   Temp 98.3 F (36.8 C) (Oral)   Ht 6\' 2"  (1.88 m)   Wt (!) 473 lb (214.6 kg)   SpO2 98%   BMI 60.73 kg/m  Objective:   Physical Exam Vitals and nursing note reviewed.  Constitutional:      Appearance: Normal appearance.  Cardiovascular:     Rate and Rhythm: Normal rate and regular rhythm.     Pulses: Normal  pulses.     Heart sounds: Normal heart sounds.  Pulmonary:     Effort: Pulmonary effort is normal.     Breath sounds: Normal breath sounds.  Musculoskeletal:        General: Normal range of motion.     Cervical back: Tenderness and bony tenderness present. No erythema, rigidity, torticollis or crepitus. Normal range of motion.     Comments: Tenderness throughout cervical spine with light palpation. Noted suspected fat pad on neck.   Skin:    General: Skin is warm and dry.     Capillary Refill: Capillary refill takes less than 2 seconds.  Neurological:     General: No focal deficit present.     Mental Status: She is alert and oriented to person, place, and time.      Assessment & Plan:  1. Cervical radiculopathy - Order MRI of cervical spine. Cannot r/o infection or mass.  - MR Cervical Spine Wo Contrast; Future - MR Brain Wo Contrast; Future  2. Cervicogenic headache  - MR Cervical Spine Wo Contrast; Future - MR Brain Wo Contrast; Future - SUMAtriptan (IMITREX) 50 MG tablet; Take 1 tablet (50 mg total) by mouth every 2 (two) hours as needed for migraine. May repeat in 2 hours if headache persists or recurs.  Dispense: 10 tablet; Refill: 1  Dorothyann Peng, NP

## 2021-08-08 NOTE — Telephone Encounter (Signed)
Please advise 

## 2021-08-12 DIAGNOSIS — Z794 Long term (current) use of insulin: Secondary | ICD-10-CM | POA: Diagnosis not present

## 2021-08-12 DIAGNOSIS — G4733 Obstructive sleep apnea (adult) (pediatric): Secondary | ICD-10-CM | POA: Diagnosis not present

## 2021-08-12 DIAGNOSIS — I10 Essential (primary) hypertension: Secondary | ICD-10-CM | POA: Diagnosis not present

## 2021-08-12 DIAGNOSIS — E282 Polycystic ovarian syndrome: Secondary | ICD-10-CM | POA: Diagnosis not present

## 2021-08-12 DIAGNOSIS — E119 Type 2 diabetes mellitus without complications: Secondary | ICD-10-CM | POA: Diagnosis not present

## 2021-08-12 DIAGNOSIS — Z6841 Body Mass Index (BMI) 40.0 and over, adult: Secondary | ICD-10-CM | POA: Diagnosis not present

## 2021-08-22 ENCOUNTER — Encounter: Payer: Self-pay | Admitting: Adult Health

## 2021-08-22 NOTE — Telephone Encounter (Signed)
Please advise 

## 2021-08-28 DIAGNOSIS — Z79899 Other long term (current) drug therapy: Secondary | ICD-10-CM | POA: Diagnosis not present

## 2021-08-28 DIAGNOSIS — N915 Oligomenorrhea, unspecified: Secondary | ICD-10-CM | POA: Diagnosis not present

## 2021-08-28 DIAGNOSIS — Z6841 Body Mass Index (BMI) 40.0 and over, adult: Secondary | ICD-10-CM | POA: Diagnosis not present

## 2021-08-28 DIAGNOSIS — M419 Scoliosis, unspecified: Secondary | ICD-10-CM | POA: Diagnosis not present

## 2021-08-28 DIAGNOSIS — M545 Low back pain, unspecified: Secondary | ICD-10-CM | POA: Diagnosis not present

## 2021-08-28 DIAGNOSIS — M25571 Pain in right ankle and joints of right foot: Secondary | ICD-10-CM | POA: Diagnosis not present

## 2021-08-28 DIAGNOSIS — M542 Cervicalgia: Secondary | ICD-10-CM | POA: Diagnosis not present

## 2021-08-30 DIAGNOSIS — Z79899 Other long term (current) drug therapy: Secondary | ICD-10-CM | POA: Diagnosis not present

## 2021-09-02 ENCOUNTER — Other Ambulatory Visit: Payer: Self-pay

## 2021-09-02 ENCOUNTER — Ambulatory Visit
Admission: RE | Admit: 2021-09-02 | Discharge: 2021-09-02 | Disposition: A | Discharge status: Home / Self Care | Payer: Medicaid Other | Source: Ambulatory Visit | Attending: Adult Health | Admitting: Adult Health

## 2021-09-02 DIAGNOSIS — G4486 Cervicogenic headache: Secondary | ICD-10-CM

## 2021-09-02 DIAGNOSIS — Z713 Dietary counseling and surveillance: Secondary | ICD-10-CM | POA: Diagnosis not present

## 2021-09-02 DIAGNOSIS — M5412 Radiculopathy, cervical region: Secondary | ICD-10-CM

## 2021-09-11 ENCOUNTER — Encounter: Payer: Self-pay | Admitting: Adult Health

## 2021-09-11 NOTE — Telephone Encounter (Signed)
Pt has been scheduled.  °

## 2021-09-17 DIAGNOSIS — F439 Reaction to severe stress, unspecified: Secondary | ICD-10-CM | POA: Diagnosis not present

## 2021-09-18 ENCOUNTER — Ambulatory Visit: Payer: Medicaid Other | Admitting: Adult Health

## 2021-09-19 ENCOUNTER — Ambulatory Visit: Payer: Medicaid Other | Admitting: Obstetrics

## 2021-09-23 ENCOUNTER — Ambulatory Visit: Payer: Medicaid Other | Admitting: Obstetrics and Gynecology

## 2021-09-23 ENCOUNTER — Other Ambulatory Visit: Payer: Medicaid Other

## 2021-09-23 ENCOUNTER — Inpatient Hospital Stay: Admission: RE | Admit: 2021-09-23 | Payer: Medicaid Other | Source: Ambulatory Visit

## 2021-09-24 ENCOUNTER — Ambulatory Visit: Payer: Medicaid Other | Admitting: Adult Health

## 2021-09-24 ENCOUNTER — Encounter: Payer: Self-pay | Admitting: Adult Health

## 2021-09-24 VITALS — BP 120/88 | HR 97 | Temp 98.9°F | Ht 74.0 in | Wt >= 6400 oz

## 2021-09-24 DIAGNOSIS — R0609 Other forms of dyspnea: Secondary | ICD-10-CM | POA: Diagnosis not present

## 2021-09-24 MED ORDER — BLOOD PRESSURE MON/AUTO/WRIST DEVI: 0 refills | Status: DC

## 2021-09-24 NOTE — Progress Notes (Signed)
Subjective:    Patient ID: Maureen Lewis, female    DOB: 1996/06/29, 25 y.o.   MRN: [id number]  HPI 25 year old female who  has a past medical history of Abnormal uterine bleeding, Acid reflux, Alpha thalassemia trait, Amenorrhea, Anemia, Asthma, Back pain, Cesarean delivery delivered (10/11/2019), Chest pain, Complication of anesthesia, Constipation, Depression, Fibromyalgia, Finger mass, left (03/2017), Gestational diabetes (05/23/2019), Gestational hypertension (09/23/2019), Headache, Hypertension, Infection, Irritable bowel syndrome (IBS), Joint pain, Morbid obesity with body mass index (BMI) of 45.0 to 49.9 in adult [facility]), Obesity during pregnancy, antepartum (04/06/2019), Plantar fasciitis, bilateral, Polycystic ovary syndrome, Pre-diabetes, Prediabetes, Pregnancy induced hypertension, Shortness of breath, Sleep apnea, Supervision of normal first pregnancy (04/06/2019), and Vertigo (2017).  She presents to the office today for for ongoing SOB especially with exertion.  She was receiving Advair 2 puffs once daily but noticed no effect from this.  In the past prednisone burst did decrease her dyspnea but did not wipe it out altogether.  He sleeps on an incline due to DOE.  She does use CPAP nightly  She has been seen by Pulmonary in the past for his. PFT's were normal and there was no evidence of asthma   She has also been seen by cardiology, echo showed EF of 55 to 60%.  Mild mitral valve regurgitation.  Right ventricle size was mildly elevated.  No aortic stenosis  She is currently going to the process with bariatrics through [facility] in order to have weight loss surgery.  Wt Readings from Last 3 Encounters:  09/24/21 (!) 492 lb (223.2 kg)  08/08/21 (!) 473 lb (214.6 kg)  07/11/21 (!) 478 lb (216.8 kg)    Review of Systems See HPI   Past Medical History:  Diagnosis Date   Abnormal uterine bleeding    Acid reflux    Alpha thalassemia trait    Amenorrhea     Anemia    no current med.   Asthma    Back pain    Cesarean delivery delivered 10/11/2019   10/09/2019 - primary CS for failed IOL   Chest pain    resolved   Complication of anesthesia    states had to keep giving her anesthesia during EGD   Constipation    Depression    "I'm good"   Fibromyalgia    Finger mass, left 03/2017   middle finger   Gestational diabetes 05/23/2019   hx - with pregnancy only    Gestational hypertension 09/23/2019   Guidelines for Antenatal Testing and Sonography  (with updated ICD-10 codes)  Updated  2019-10-07 with Dr. Tama High  INDICATION U/S 2 X week NST/AFI  or full BPP wkly DELIVERY Diabetes   A1 - good control - O24.410    A2 - good control - O24.419      A2  - poor control or poor compliance - O24.419, E11.65   (Macrosomia or polyhydramnios) **E11.65 is extra code for poor control**    A2/B - O24.   Headache    Hypertension    Infection    UTI   Irritable bowel syndrome (IBS)    Joint pain    Morbid obesity with body mass index (BMI) of 45.0 to 49.9 in adult [facility])    Obesity during pregnancy, antepartum 04/06/2019   Body mass index is 53.71 kg/m.  Recommendations [x]  Aspirin 81 mg daily after 12 weeks; discontinue after 36 weeks [ ]  Nutrition consult [ ]  Weight gain 11-20 lbs for singleton and  25-35 lbs for twin pregnancy (IOM guidelines) Higher class of obesity patients recommended to gain closer to lower limit  Weight loss is associated with adverse outcomes [ ]  Baseline and surveillance labs (pulled in fr   Plantar fasciitis, bilateral    resolved   Polycystic ovary syndrome    Pre-diabetes    no meds, diet controlled, diet not check blood sugar   Prediabetes    "prediabetes"   Pregnancy induced hypertension    Shortness of breath    albuterol inhaler   Sleep apnea    no CPAP use   Supervision of normal first pregnancy 04/06/2019   [name]: [ x]Initial [ x]12 [ ] 20 [ ] 28 [ ] 32 [ ] 36 [ ] 38 [ ] 39 [ ] 40 Nursing Staff Provider  Office Location [facility]  Dating   LMP Language  English  Anatomy US  Nml Flu Vaccine  Declined 07/28/19 Genetic Screen  NIPS: low risks   AFP:   negative  TDaP vaccine   08/25/19 Hgb A1C or  GTT Early A1C 5.8 Third trimester: GDM insulin Rhogam  NA   LAB RESULTS  Feeding Plan Breast Blood Type   Vertigo 2017    Social History   Socioeconomic History   Marital status: Single    Spouse name: Not on file   Number of children: Not on file   Years of education: Not on file   Highest education level: Not on file  Occupational History   Occupation: Chemical engineer    Employer: [employer]  Tobacco Use   Smoking status: Never   Smokeless tobacco: Never  Vaping Use   Vaping Use: Never used  Substance and Sexual Activity   Alcohol use: Not Currently    Comment: social    Drug use: No   Sexual activity: Yes    Birth control/protection: Condom  Other Topics Concern   Not on file  Social History Narrative   Right handed    Soda sometimes   Lives with mom and cousin, a grandmother in [place] also helps care for her.       She is in nursing school.    Social Determinants of Health   Financial Resource Strain: Not on file  Food Insecurity: Not on file  Transportation Needs: Not on file  Physical Activity: Not on file  Stress: Not on file  Social Connections: Not on file  Intimate Partner Violence: Not on file    Past Surgical History:  Procedure Laterality Date   CESAREAN SECTION N/A 10/09/2019   Procedure: CESAREAN SECTION;  Surgeon: Aletha Halim, MD;  Location: [facility];  Service: Obstetrics;  Laterality: N/A;   COLONOSCOPY WITH PROPOFOL  10/07/2016   DILATION AND CURETTAGE OF UTERUS     ESOPHAGOGASTRODUODENOSCOPY  12/21/2015   EXCISION MASS UPPER EXTREMETIES Left 04/21/2017   Procedure: EXCISION MASS LEFT MIDDLE FINGER;  Surgeon: Daryll Brod, MD;  Location: [place];  Service: Orthopedics;  Laterality: Left;   IR FL GUIDED LOC OF NEEDLE/CATH TIP FOR  SPINAL INJECTION RT  03/13/2020   PHOTOCOAGULATION WITH LASER Left 12/13/2020   Procedure: RETINOPLEXY LEFT EYE;  Surgeon: Sherlynn Stalls, MD;  Location: [place];  Service: Ophthalmology;  Laterality: Left;   SCLERAL BUCKLE Right 12/13/2020   Procedure: SCLERAL BUCKLE WITH CRYO;  Surgeon: Sherlynn Stalls, MD;  Location: [place];  Service: Ophthalmology;  Laterality: Right;    Family History  Problem Relation Age of Onset   Diabetes Maternal Aunt    Hypertension Maternal Uncle  Depression Maternal Grandfather    Diabetes Maternal Grandfather    Hypertension Maternal Grandfather    Arthritis Mother    High blood pressure Mother    Depression Mother    Sleep apnea Mother    Obesity Mother    Diabetes Mother    Hypertension Mother    Kidney failure Mother    Sleep apnea Father    Obesity Father    Healthy Daughter     Allergies  Allergen Reactions   Bee Pollen Hives, Shortness Of Breath and Swelling   Bee Venom Hives, Shortness Of Breath and Swelling   Hydrocodone-Acetaminophen Anaphylaxis    No problem when she takes Tylenol   Peach Flavor Hives, Shortness Of Breath and Other (See Comments)    SWELLING OF MOUTH   Pollen Extract Hives, Shortness Of Breath and Swelling   Remdesivir Swelling    Angioedema 8/25   Cortisone Itching and Swelling   Cortizone-10 [Hydrocortisone]     Swelling and itching    Current Outpatient Medications on File Prior to Visit  Medication Sig Dispense Refill   amLODipine (NORVASC) 10 MG tablet Take 1 tablet (10 mg total) by mouth daily. 90 tablet 3   diclofenac (VOLTAREN) 75 MG EC tablet Take 1 tablet (75 mg total) by mouth 2 (two) times daily. (Patient taking differently: Take 75 mg by mouth 2 (two) times daily as needed (pain).) 60 tablet 1   Dulaglutide (TRULICITY) 9.37 DS/2.8JG SOPN Inject 0.75 mg into the skin every Wednesday.     fluticasone (FLONASE) 50 MCG/ACT nasal spray Place 1 spray into both nostrils daily. 16 g 2   fluticasone-salmeterol  (ADVAIR HFA) 115-21 MCG/ACT inhaler Inhale 2 puffs into the lungs 2 (two) times daily. 3 each 3   gabapentin (NEURONTIN) 600 MG tablet Take 600 mg by mouth 3 (three) times daily.     hydrochlorothiazide (HYDRODIURIL) 25 MG tablet Take 1 tablet (25 mg total) by mouth daily. 90 tablet 3   ibuprofen (ADVIL) 600 MG tablet Take 1 tablet (600 mg total) by mouth every 6 (six) hours as needed. 30 tablet 0   loratadine (CLARITIN) 10 MG tablet Take 10 mg by mouth daily as needed for allergies.      metroNIDAZOLE (FLAGYL) 500 MG tablet Take 1 tablet (500 mg total) by mouth 2 (two) times daily. 14 tablet 2   naproxen (NAPROSYN) 500 MG tablet Take 1 tablet (500 mg total) by mouth 2 (two) times daily with a meal. (Patient taking differently: Take 500 mg by mouth 2 (two) times daily as needed (pain).) 30 tablet 0   oxyCODONE-acetaminophen (PERCOCET/ROXICET) 5-325 MG tablet Take 1 tablet by mouth 5 (five) times daily.     pantoprazole (PROTONIX) 40 MG tablet Take 1 tablet (40 mg total) by mouth daily. 90 tablet 0   polyethylene glycol powder (GLYCOLAX/MIRALAX) 17 GM/SCOOP powder Take 17 g by mouth daily as needed for moderate constipation.      SUMAtriptan (IMITREX) 50 MG tablet Take 1 tablet (50 mg total) by mouth every 2 (two) hours as needed for migraine. May repeat in 2 hours if headache persists or recurs. 10 tablet 1   Vitamin D, Ergocalciferol, (DRISDOL) 1.25 MG (50000 UNIT) CAPS capsule Take 1 capsule (50,000 Units total) by mouth every 7 (seven) days. (Patient taking differently: Take 50,000 Units by mouth every Monday.) 4 capsule 0   ferrous sulfate 325 (65 FE) MG tablet Take 1 tablet (325 mg total) by mouth every other day. 45 tablet 1  No current facility-administered medications on file prior to visit.    BP 120/88   Pulse 97   Temp 98.9 F (37.2 C) (Other (Comment))   Ht 6\' 2"  (1.88 m)   Wt (!) 492 lb (223.2 kg)   SpO2 98%   BMI 63.17 kg/m       Objective:   Physical Exam Vitals and  nursing note reviewed.  Constitutional:      Appearance: She is well-developed. She is obese.  Cardiovascular:     Rate and Rhythm: Normal rate and regular rhythm.     Pulses: Normal pulses.     Heart sounds: Normal heart sounds.  Pulmonary:     Effort: Pulmonary effort is normal.     Breath sounds: Normal breath sounds.  Skin:    General: Skin is warm and dry.     Capillary Refill: Capillary refill takes less than 2 seconds.  Neurological:     General: No focal deficit present.     Mental Status: She is alert and oriented to person, place, and time.  Psychiatric:        Mood and Affect: Mood normal.        Behavior: Behavior normal.        Thought Content: Thought content normal.        Judgment: Judgment normal.      Assessment & Plan:  1. Dyspnea on exertion - likely due to body habitus.  - Follow up with Bariatrics as directed - Follow up PRN   Dorothyann Peng, NP   Time spent on chart review, time with patient; discussion of obesity and shortness of breath, treatment, chart review, follow up plan, and documentation 30 minutes

## 2021-09-26 ENCOUNTER — Ambulatory Visit: Payer: Medicaid Other | Admitting: Obstetrics

## 2021-09-26 DIAGNOSIS — G4733 Obstructive sleep apnea (adult) (pediatric): Secondary | ICD-10-CM | POA: Diagnosis not present

## 2021-09-26 DIAGNOSIS — G932 Benign intracranial hypertension: Secondary | ICD-10-CM | POA: Diagnosis not present

## 2021-09-26 DIAGNOSIS — E282 Polycystic ovarian syndrome: Secondary | ICD-10-CM | POA: Diagnosis not present

## 2021-09-26 DIAGNOSIS — E1169 Type 2 diabetes mellitus with other specified complication: Secondary | ICD-10-CM | POA: Diagnosis not present

## 2021-09-26 DIAGNOSIS — K219 Gastro-esophageal reflux disease without esophagitis: Secondary | ICD-10-CM | POA: Diagnosis not present

## 2021-09-26 DIAGNOSIS — I10 Essential (primary) hypertension: Secondary | ICD-10-CM | POA: Diagnosis not present

## 2021-09-26 DIAGNOSIS — E669 Obesity, unspecified: Secondary | ICD-10-CM | POA: Diagnosis not present

## 2021-09-26 DIAGNOSIS — Z6841 Body Mass Index (BMI) 40.0 and over, adult: Secondary | ICD-10-CM | POA: Diagnosis not present

## 2021-10-03 DIAGNOSIS — R5383 Other fatigue: Secondary | ICD-10-CM | POA: Diagnosis not present

## 2021-10-03 DIAGNOSIS — M25571 Pain in right ankle and joints of right foot: Secondary | ICD-10-CM | POA: Diagnosis not present

## 2021-10-03 DIAGNOSIS — M545 Low back pain, unspecified: Secondary | ICD-10-CM | POA: Diagnosis not present

## 2021-10-03 DIAGNOSIS — D539 Nutritional anemia, unspecified: Secondary | ICD-10-CM | POA: Diagnosis not present

## 2021-10-03 DIAGNOSIS — Z79899 Other long term (current) drug therapy: Secondary | ICD-10-CM | POA: Diagnosis not present

## 2021-10-03 DIAGNOSIS — M419 Scoliosis, unspecified: Secondary | ICD-10-CM | POA: Diagnosis not present

## 2021-10-03 DIAGNOSIS — M542 Cervicalgia: Secondary | ICD-10-CM | POA: Diagnosis not present

## 2021-10-03 DIAGNOSIS — E559 Vitamin D deficiency, unspecified: Secondary | ICD-10-CM | POA: Diagnosis not present

## 2021-10-03 DIAGNOSIS — M129 Arthropathy, unspecified: Secondary | ICD-10-CM | POA: Diagnosis not present

## 2021-10-03 DIAGNOSIS — R7303 Prediabetes: Secondary | ICD-10-CM | POA: Diagnosis not present

## 2021-10-03 DIAGNOSIS — N915 Oligomenorrhea, unspecified: Secondary | ICD-10-CM | POA: Diagnosis not present

## 2021-10-03 DIAGNOSIS — Z6841 Body Mass Index (BMI) 40.0 and over, adult: Secondary | ICD-10-CM | POA: Diagnosis not present

## 2021-10-07 DIAGNOSIS — E1169 Type 2 diabetes mellitus with other specified complication: Secondary | ICD-10-CM | POA: Diagnosis not present

## 2021-10-07 DIAGNOSIS — Z9989 Dependence on other enabling machines and devices: Secondary | ICD-10-CM | POA: Diagnosis not present

## 2021-10-07 DIAGNOSIS — E559 Vitamin D deficiency, unspecified: Secondary | ICD-10-CM | POA: Diagnosis not present

## 2021-10-07 DIAGNOSIS — E669 Obesity, unspecified: Secondary | ICD-10-CM | POA: Diagnosis not present

## 2021-10-07 DIAGNOSIS — G4733 Obstructive sleep apnea (adult) (pediatric): Secondary | ICD-10-CM | POA: Diagnosis not present

## 2021-10-07 DIAGNOSIS — Z6841 Body Mass Index (BMI) 40.0 and over, adult: Secondary | ICD-10-CM | POA: Diagnosis not present

## 2021-10-07 DIAGNOSIS — Z79899 Other long term (current) drug therapy: Secondary | ICD-10-CM | POA: Diagnosis not present

## 2021-10-07 DIAGNOSIS — Z794 Long term (current) use of insulin: Secondary | ICD-10-CM | POA: Diagnosis not present

## 2021-10-11 DIAGNOSIS — E282 Polycystic ovarian syndrome: Secondary | ICD-10-CM | POA: Diagnosis not present

## 2021-10-11 DIAGNOSIS — G932 Benign intracranial hypertension: Secondary | ICD-10-CM | POA: Diagnosis not present

## 2021-10-11 DIAGNOSIS — K219 Gastro-esophageal reflux disease without esophagitis: Secondary | ICD-10-CM | POA: Diagnosis not present

## 2021-10-11 DIAGNOSIS — G4733 Obstructive sleep apnea (adult) (pediatric): Secondary | ICD-10-CM | POA: Diagnosis not present

## 2021-10-11 DIAGNOSIS — E1169 Type 2 diabetes mellitus with other specified complication: Secondary | ICD-10-CM | POA: Diagnosis not present

## 2021-10-11 DIAGNOSIS — I10 Essential (primary) hypertension: Secondary | ICD-10-CM | POA: Diagnosis not present

## 2021-10-15 ENCOUNTER — Encounter (HOSPITAL_COMMUNITY): Payer: Self-pay | Admitting: Emergency Medicine

## 2021-10-15 ENCOUNTER — Ambulatory Visit (INDEPENDENT_AMBULATORY_CARE_PROVIDER_SITE_OTHER): Payer: Medicaid Other

## 2021-10-15 ENCOUNTER — Ambulatory Visit (HOSPITAL_COMMUNITY)
Admission: EM | Admit: 2021-10-15 | Discharge: 2021-10-15 | Disposition: A | Discharge status: Home / Self Care | Payer: Medicaid Other | Attending: Physician Assistant | Admitting: Physician Assistant

## 2021-10-15 ENCOUNTER — Other Ambulatory Visit: Payer: Self-pay

## 2021-10-15 DIAGNOSIS — W19XXXA Unspecified fall, initial encounter: Secondary | ICD-10-CM | POA: Diagnosis not present

## 2021-10-15 DIAGNOSIS — M25562 Pain in left knee: Secondary | ICD-10-CM | POA: Diagnosis not present

## 2021-10-15 DIAGNOSIS — M79605 Pain in left leg: Secondary | ICD-10-CM

## 2021-10-15 DIAGNOSIS — M25552 Pain in left hip: Secondary | ICD-10-CM

## 2021-10-15 DIAGNOSIS — M25572 Pain in left ankle and joints of left foot: Secondary | ICD-10-CM

## 2021-10-15 DIAGNOSIS — W108XXA Fall (on) (from) other stairs and steps, initial encounter: Secondary | ICD-10-CM

## 2021-10-15 DIAGNOSIS — M7989 Other specified soft tissue disorders: Secondary | ICD-10-CM | POA: Diagnosis not present

## 2021-10-15 NOTE — ED Triage Notes (Signed)
Pt reports was going up steps at her house on second one from top (of 7) fell down stairs. C/o knee to foot pain. Pain worse with weight bearing

## 2021-10-15 NOTE — ED Provider Notes (Signed)
[place]    CSN: [id number] Arrival date & time: 10/15/21  0909      History   Chief Complaint Chief Complaint  Patient presents with   Fall   Leg Pain    HPI Maureen Lewis is a 25 y.o. female.   Patient here today for evaluation of pain in her left leg diffusely after fall this morning. She states that pain is worse to her knee and her ankle. She does have some hip pain and would like xray of same. She reports that weight bearing makes pain worse. She does not report any treatment for symptoms. She is not aware of any head injury and denies any LOC.   The history is provided by the patient.  Fall Pertinent negatives include no abdominal pain and no shortness of breath.  Leg Pain Associated symptoms: no fever    Past Medical History:  Diagnosis Date   Abnormal uterine bleeding    Acid reflux    Alpha thalassemia trait    Amenorrhea    Anemia    no current med.   Asthma    Back pain    Cesarean delivery delivered 10/11/2019   10/09/2019 - primary CS for failed IOL   Chest pain    resolved   Complication of anesthesia    states had to keep giving her anesthesia during EGD   Constipation    Depression    "I'm good"   Fibromyalgia    Finger mass, left 03/2017   middle finger   Gestational diabetes 05/23/2019   hx - with pregnancy only    Gestational hypertension 09/23/2019   Guidelines for Antenatal Testing and Sonography  (with updated ICD-10 codes)  Updated  09-20-19 with Dr. Tama High  INDICATION U/S 2 X week NST/AFI  or full BPP wkly DELIVERY Diabetes   A1 - good control - O24.410    A2 - good control - O24.419      A2  - poor control or poor compliance - O24.419, E11.65   (Macrosomia or polyhydramnios) **E11.65 is extra code for poor control**    A2/B - O24.   Headache    Hypertension    Infection    UTI   Irritable bowel syndrome (IBS)    Joint pain    Morbid obesity with body mass index (BMI) of 45.0 to 49.9 in adult [facility])     Obesity during pregnancy, antepartum 04/06/2019   Body mass index is 53.71 kg/m.  Recommendations [x]  Aspirin 81 mg daily after 12 weeks; discontinue after 36 weeks [ ]  Nutrition consult [ ]  Weight gain 11-20 lbs for singleton and 25-35 lbs for twin pregnancy (IOM guidelines) Higher class of obesity patients recommended to gain closer to lower limit  Weight loss is associated with adverse outcomes [ ]  Baseline and surveillance labs (pulled in fr   Plantar fasciitis, bilateral    resolved   Polycystic ovary syndrome    Pre-diabetes    no meds, diet controlled, diet not check blood sugar   Prediabetes    "prediabetes"   Pregnancy induced hypertension    Shortness of breath    albuterol inhaler   Sleep apnea    no CPAP use   Supervision of normal first pregnancy 04/06/2019   [name]: [ x]Initial [ x]12 [ ] 20 [ ] 28 [ ] 32 [ ] 36 [ ] 38 [ ] 39 [ ] 40 Nursing Staff Provider Office Location [facility]  Dating   LMP Language  Transport planner  [name]  Nml Flu Vaccine  Declined 07/28/19 Genetic Screen  NIPS: low risks   AFP:   negative  TDaP vaccine   08/25/19 Hgb A1C or  GTT Early A1C 5.8 Third trimester: GDM insulin Rhogam  NA   LAB RESULTS  Feeding Plan Breast Blood Type   Vertigo 2017    Patient Active Problem List   Diagnosis Date Noted   IUD check up 05/15/2021   Encounter for insertion of mirena IUD 04/01/2021   Levator spasm 11/21/2020   Galactorrhea 11/19/2020   Hx of migraine headaches 04/09/2020   Contraceptive management 11/22/2019   Positive RPR test 10/09/2019   HTN (hypertension) 09/23/2019   Migraine headache 07/12/2019   Alpha thalassemia trait    Vitamin D deficiency 09/16/2017   Prediabetes 09/16/2017   Depression 09/16/2017   Class 3 severe obesity with serious comorbidity and body mass index (BMI) of 50.0 to 59.9 in adult ([place]) 09/16/2017   PCOS (polycystic ovarian syndrome) 08/13/2017   Mass 05/06/2017   Vertigo 10/06/2016   Microcytic anemia 09/26/2016    Pseudotumor cerebri syndrome 08/28/2016   Obesity hypoventilation syndrome ([place]) 08/28/2016   Benign paroxysmal positional vertigo due to bilateral vestibular disorder 08/28/2016   OSA (obstructive sleep apnea) 08/28/2016   Hypersomnia with sleep apnea 08/28/2016   Female hirsutism 08/28/2016   GERD (gastroesophageal reflux disease) 02/24/2015   Constipation 10/02/2014    Past Surgical History:  Procedure Laterality Date   CESAREAN SECTION N/A 10/09/2019   Procedure: CESAREAN SECTION;  Surgeon: Aletha Halim, MD;  Location: [facility];  Service: Obstetrics;  Laterality: N/A;   COLONOSCOPY WITH PROPOFOL  10/07/2016   DILATION AND CURETTAGE OF UTERUS     ESOPHAGOGASTRODUODENOSCOPY  12/21/2015   EXCISION MASS UPPER EXTREMETIES Left 04/21/2017   Procedure: EXCISION MASS LEFT MIDDLE FINGER;  Surgeon: Daryll Brod, MD;  Location: [place];  Service: Orthopedics;  Laterality: Left;   IR FL GUIDED LOC OF NEEDLE/CATH TIP FOR SPINAL INJECTION RT  03/13/2020   PHOTOCOAGULATION WITH LASER Left 12/13/2020   Procedure: RETINOPLEXY LEFT EYE;  Surgeon: Sherlynn Stalls, MD;  Location: [place];  Service: Ophthalmology;  Laterality: Left;   SCLERAL BUCKLE Right 12/13/2020   Procedure: SCLERAL BUCKLE WITH CRYO;  Surgeon: Sherlynn Stalls, MD;  Location: [place];  Service: Ophthalmology;  Laterality: Right;    OB History     Gravida  1   Para  1   Term  1   Preterm  0   AB  0   Living  1      SAB  0   IAB  0   Ectopic  0   Multiple  0   Live Births  1            Home Medications    Prior to Admission medications   Medication Sig Start Date End Date Taking? Authorizing Provider  amLODipine (NORVASC) 10 MG tablet Take 1 tablet (10 mg total) by mouth daily. 11/08/20   Dorothyann Peng, NP  Blood Pressure Monitoring (BLOOD PRESSURE MON/AUTO/WRIST) DEVI Use to monitor blood pressure 09/24/21   Nafziger, Tommi Rumps, NP  diclofenac (VOLTAREN) 75 MG EC tablet Take 1 tablet (75 mg  total) by mouth 2 (two) times daily. Patient taking differently: Take 75 mg by mouth 2 (two) times daily as needed (pain). 07/11/20   Dene Gentry, MD  Dulaglutide (TRULICITY) 1.49 FW/2.6VZ SOPN Inject 0.75 mg into the skin every Wednesday.    [provider]  ferrous  sulfate 325 (65 FE) MG tablet Take 1 tablet (325 mg total) by mouth every other day. 02/23/20 07/19/21  Nafziger, Tommi Rumps, NP  fluticasone (FLONASE) 50 MCG/ACT nasal spray Place 1 spray into both nostrils daily. 06/11/21   Maryjane Hurter, MD  fluticasone-salmeterol (ADVAIR HFA) [phone] MCG/ACT inhaler Inhale 2 puffs into the lungs 2 (two) times daily. 05/29/21   Nafziger, Tommi Rumps, NP  gabapentin (NEURONTIN) 600 MG tablet Take 600 mg by mouth 3 (three) times daily. 05/18/21   [provider]  hydrochlorothiazide (HYDRODIURIL) 25 MG tablet Take 1 tablet (25 mg total) by mouth daily. 11/08/20   Nafziger, Tommi Rumps, NP  ibuprofen (ADVIL) 600 MG tablet Take 1 tablet (600 mg total) by mouth every 6 (six) hours as needed. 9/38/18   Campbell Stall P, DO  loratadine (CLARITIN) 10 MG tablet Take 10 mg by mouth daily as needed for allergies.     [provider]  metroNIDAZOLE (FLAGYL) 500 MG tablet Take 1 tablet (500 mg total) by mouth 2 (two) times daily. 06/27/21   Shelly Bombard, MD  naproxen (NAPROSYN) 500 MG tablet Take 1 tablet (500 mg total) by mouth 2 (two) times daily with a meal. Patient taking differently: Take 500 mg by mouth 2 (two) times daily as needed (pain). 03/29/21   Jaynee Eagles, PA-C  oxyCODONE-acetaminophen (PERCOCET/ROXICET) 5-325 MG tablet Take 1 tablet by mouth 5 (five) times daily.    [provider]  pantoprazole (PROTONIX) 40 MG tablet Take 1 tablet (40 mg total) by mouth daily. 06/11/21   Maryjane Hurter, MD  polyethylene glycol powder (GLYCOLAX/MIRALAX) 17 GM/SCOOP powder Take 17 g by mouth daily as needed for moderate constipation.  10/11/19   [provider]  SUMAtriptan (IMITREX) 50  MG tablet Take 1 tablet (50 mg total) by mouth every 2 (two) hours as needed for migraine. May repeat in 2 hours if headache persists or recurs. 08/08/21   Nafziger, Tommi Rumps, NP  Vitamin D, Ergocalciferol, (DRISDOL) 1.25 MG (50000 UNIT) CAPS capsule Take 1 capsule (50,000 Units total) by mouth every 7 (seven) days. Patient taking differently: Take 50,000 Units by mouth every Monday. 05/06/21   Laqueta Linden, MD    Family History Family History  Problem Relation Age of Onset   Diabetes Maternal Aunt    Hypertension Maternal Uncle    Depression Maternal Grandfather    Diabetes Maternal Grandfather    Hypertension Maternal Grandfather    Arthritis Mother    High blood pressure Mother    Depression Mother    Sleep apnea Mother    Obesity Mother    Diabetes Mother    Hypertension Mother    Kidney failure Mother    Sleep apnea Father    Obesity Father    Healthy Daughter     Social History Social History   Tobacco Use   Smoking status: Never   Smokeless tobacco: Never  Vaping Use   Vaping Use: Never used  Substance Use Topics   Alcohol use: Not Currently    Comment: social    Drug use: No     Allergies   Bee pollen, Bee venom, Hydrocodone-acetaminophen, Peach flavor, Pollen extract, Remdesivir, Cortisone, and Cortizone-10 [hydrocortisone]   Review of Systems Review of Systems  Constitutional:  Negative for chills and fever.  Eyes:  Negative for discharge and redness.  Respiratory:  Negative for shortness of breath.   Gastrointestinal:  Negative for abdominal pain, nausea and vomiting.  Musculoskeletal:  Positive for arthralgias and gait  problem.  Skin:  Negative for color change.  Neurological:  Negative for weakness and numbness.    Physical Exam Triage Vital Signs ED Triage Vitals [10/15/21 0934]  Enc Vitals Group     BP      Pulse      Resp      Temp      Temp src      SpO2      Weight      Height      Head Circumference      Peak Flow      Pain  Score 10     Pain Loc      Pain Edu?      Excl. in [place]?    No data found.  Updated Vital Signs BP (!) 153/94 (BP Location: Right Arm)    Pulse (!) 101    Temp 98.5 F (36.9 C) (Oral)    Resp 20    LMP 10/02/2021    SpO2 97%      Physical Exam Vitals and nursing note reviewed.  Constitutional:      General: She is not in acute distress.    Appearance: Normal appearance. She is not ill-appearing.  HENT:     Head: Normocephalic and atraumatic.  Eyes:     Conjunctiva/sclera: Conjunctivae normal.  Cardiovascular:     Rate and Rhythm: Normal rate.  Pulmonary:     Effort: Pulmonary effort is normal.  Musculoskeletal:     Comments: Decreased ROM of left knee, ankle due to pain, mild swelling appreciated to left lateral malleolus  Skin:    Capillary Refill: Normal cap refill to left toes Neurological:     Mental Status: She is alert.     Comments: Gross sensation intact to left toes distally  Psychiatric:        Mood and Affect: Mood normal.        Behavior: Behavior normal.        Thought Content: Thought content normal.     UC Treatments / Results  Labs (all labs ordered are listed, but only abnormal results are displayed) Labs Reviewed - No data to display  EKG   Radiology DG Ankle Complete Left  Result Date: 10/15/2021 CLINICAL DATA:  Fall down steps with left-sided extremity pain EXAM: LEFT ANKLE COMPLETE - 3+ VIEW COMPARISON:  03/14/2021 FINDINGS: Lateral soft tissue swelling.  No acute fracture or subluxation. Subcutaneous angulated foreign body measuring 8 mm in length in the lateral plantar soft tissues. This is new from the comparison study. IMPRESSION: No acute fracture or subluxation. 8 mm long foreign body in the plantar subcutaneous tissues. Electronically Signed   By: Jorje Guild M.D.   On: 10/15/2021 10:15   DG Hip Unilat With Pelvis 2-3 Views Left  Result Date: 10/15/2021 CLINICAL DATA:  Fall down steps with pain. EXAM: DG HIP (WITH OR WITHOUT  PELVIS) 2-3V LEFT COMPARISON:  None. FINDINGS: Very limited study due to body habitus and underpenetration. No detected dislocation or fracture. IMPRESSION: Essentially nondiagnostic evaluation of the hips due to soft tissue attenuation. Electronically Signed   By: Jorje Guild M.D.   On: 10/15/2021 10:16   DG Knee AP/LAT W/Sunrise Left  Result Date: 10/15/2021 CLINICAL DATA:  Fall down steps with left-sided pain. EXAM: LEFT KNEE 3 VIEWS COMPARISON:  None. FINDINGS: No evidence of fracture, dislocation, or joint effusion. No evidence of arthropathy or other focal bone abnormality. Soft tissues are unremarkable. IMPRESSION: Negative. Electronically Signed   By:  Jorje Guild M.D.   On: 10/15/2021 10:15    Procedures Procedures (including critical care time)  Medications Ordered in UC Medications - No data to display  Initial Impression / Assessment and Plan / UC Course  I have reviewed the triage vital signs and the nursing notes.  Pertinent labs & imaging results that were available during my care of the patient were reviewed by me and considered in my medical decision making (see chart for details).    No fractures noted on xray. Recommended ace bandages, ibuprofen if needed for pain. Encouraged follow up with ortho if no gradual improvement over the next week.   Final Clinical Impressions(s) / UC Diagnoses   Final diagnoses:  Fall (on) (from) other stairs and steps, initial encounter  Fall, initial encounter  Left leg pain  Acute pain of left knee  Acute left ankle pain   Discharge Instructions   None    ED Prescriptions   None    PDMP not reviewed this encounter.   Francene Finders, PA-C 10/15/21 1112

## 2021-10-17 ENCOUNTER — Other Ambulatory Visit: Payer: Self-pay

## 2021-10-17 ENCOUNTER — Ambulatory Visit: Payer: Medicaid Other | Admitting: Family Medicine

## 2021-10-17 ENCOUNTER — Ambulatory Visit (INDEPENDENT_AMBULATORY_CARE_PROVIDER_SITE_OTHER): Payer: Medicaid Other

## 2021-10-17 VITALS — BP 158/92 | HR 94 | Ht 74.0 in | Wt >= 6400 oz

## 2021-10-17 DIAGNOSIS — S0990XA Unspecified injury of head, initial encounter: Secondary | ICD-10-CM

## 2021-10-17 DIAGNOSIS — M25572 Pain in left ankle and joints of left foot: Secondary | ICD-10-CM

## 2021-10-17 DIAGNOSIS — M542 Cervicalgia: Secondary | ICD-10-CM

## 2021-10-17 DIAGNOSIS — R519 Headache, unspecified: Secondary | ICD-10-CM | POA: Diagnosis not present

## 2021-10-17 DIAGNOSIS — M25562 Pain in left knee: Secondary | ICD-10-CM | POA: Diagnosis not present

## 2021-10-17 NOTE — Progress Notes (Signed)
I, Maureen Lewis, LAT, ATC, am serving as scribe for Dr. Lynne Lewis.  Maureen Lewis is a 25 y.o. female who [facility] to [facility] today for f/u of L knee and L ankle pain after suffering a fall on 10/15/21.  She was seen at the [facility] on 10/15/21 and had XR of her L ankle, knee and hip.  She was previously seen by Maureen Lewis on 04/17/21 for f/u of B ankle pain.  Today, pt reports she fell down 5 steps, landing on her L side. Pt locates pain to anterior aspect and lateral aspect of L ankle and on the anterior and lateral aspect of the L knee. Both L knee and ankle are swollen. Pt also c/o constant HA since the fall, but is unsure if she hit her head during the fall.  Her headache is worsened over the last 2 days.  She denies any confusion or loss of function.  She does note neck pain along the posterior neck but denies any weakness upper or lower extremities.  Treatments tried: ice, elevation, hot compresses, ace wrap, muscle relaxer  Diagnostic testing: L hip, L knee and L ankle XR- 10/15/21  Pertinent review of systems: No fevers or chills  Relevant historical information: Prior right ankle injury.  Morbid obesity.   Exam:  BP (!) 158/92    Pulse 94    Ht 6\' 2"  (1.88 m)    Wt (!) 488 lb (221.4 kg)    LMP 10/02/2021    SpO2 96%    BMI 62.66 kg/m  General: Well Developed, well nourished, and in no acute distress.   MSK:  C-spine: Tender palpation cervical midline at around C7.  Tender paraspinal musculature along cervical paraspinal muscles especially at the base of the skull. Decreased cervical motion.  Upper extremity strength reflexes and sensation are equal and normal throughout.  Left knee normal-appearing Normal motion. Tender palpation anterior knee. Stable ligamentous exam. Intact strength pain with resisted knee extension. Mildly positive lateral McMurray's test.  Left ankle tender palpation diffusely.  Stable ligamentous  exam.  Neuro: Alert and oriented normal coordination and balance.  Normal speech and thought process.  Positive for photophobia.    Lab and Radiology Results No results found for this or any previous visit (from the past 72 hour(s)). DG Ankle Complete Left  Result Date: 10/15/2021 CLINICAL DATA:  Fall down steps with left-sided extremity pain EXAM: LEFT ANKLE COMPLETE - 3+ VIEW COMPARISON:  03/14/2021 FINDINGS: Lateral soft tissue swelling.  No acute fracture or subluxation. Subcutaneous angulated foreign body measuring 8 mm in length in the lateral plantar soft tissues. This is new from the comparison study. IMPRESSION: No acute fracture or subluxation. 8 mm long foreign body in the plantar subcutaneous tissues. Electronically Signed   By: Maureen Lewis M.D.   On: 10/15/2021 10:15   DG Hip Unilat With Pelvis 2-3 Views Left  Result Date: 10/15/2021 CLINICAL DATA:  Fall down steps with pain. EXAM: DG HIP (WITH OR WITHOUT PELVIS) 2-3V LEFT COMPARISON:  None. FINDINGS: Very limited study due to body habitus and underpenetration. No detected dislocation or fracture. IMPRESSION: Essentially nondiagnostic evaluation of the hips due to soft tissue attenuation. Electronically Signed   By: Maureen Lewis M.D.   On: 10/15/2021 10:16   DG Knee AP/LAT W/Sunrise Left  Result Date: 10/15/2021 CLINICAL DATA:  Fall down steps with left-sided pain. EXAM: LEFT KNEE 3 VIEWS COMPARISON:  None. FINDINGS: No evidence  of fracture, dislocation, or joint effusion. No evidence of arthropathy or other focal bone abnormality. Soft tissues are unremarkable. IMPRESSION: Negative. Electronically Signed   By: Maureen Lewis M.D.   On: 10/15/2021 10:15     I, Maureen Lewis, personally (independently) visualized and performed the interpretation of the images attached in this note.   X-ray images C-spine obtained today personally and independently interpreted No obvious fracture visible per my interpretation. Await  formal radiology review  Stat head CT scan ordered today   Assessment and Plan: 25 y.o. female with fall down the stairs occurring on December 27.  Patient does not remember the fall and has a headache which significantly raises my suspicion for a head injury.  Externally her headache has worsened over the past 2 days. Given the amount of force that would result in a fall of 5 steps with her body weight and a worsening headache proceed with brain imaging with a stat head CT scan today.  My diagnosis currently has concussion but rule out head CT scan I believe is necessary.  Additionally she does have some posterior neck pain on midline.  X-ray per my read is negative for fracture however radiology overread is still pending.  Low suspicious for unstable cervical spine fracture.  She does have left knee pain.  X-rays in urgent care on the 27th were negative.  Diagnosis at this time is knee contusion or prepatellar bursitis.  Plan for compression Voltaren gel and recheck in 1 week.  Left ankle pain ankle sprain chief diagnosis at this time.  Plan for CAM Walker boot and compression.  Check back in 1 week.   PDMP not reviewed this encounter. Orders Placed This Encounter  Procedures   CT HEAD WO CONTRAST (5MM)    Standing Status:   Future    Standing Expiration Date:   10/17/2022    Order Specific Question:   Is patient pregnant?    Answer:   No    Order Specific Question:   Preferred imaging location?    Answer:   [facility]   DG Cervical Spine Complete    Standing Status:   Future    Number of Occurrences:   1    Standing Expiration Date:   10/17/2022    Order Specific Question:   Reason for Exam (SYMPTOM  OR DIAGNOSIS REQUIRED)    Answer:   neck pain fall    Order Specific Question:   Is patient pregnant?    Answer:   No    Order Specific Question:   Preferred imaging location?    Answer:   [facility]   No orders of the defined types were placed in this  encounter.    Discussed warning signs or symptoms. Please see discharge instructions. Patient expresses understanding.   The above documentation has been reviewed and is accurate and complete Maureen Lewis, M.D.

## 2021-10-17 NOTE — Patient Instructions (Addendum)
Thank you for coming in today.   Please go to [facility] supply to get the CAM walker boot we talked about today. You may also be able to get it from [employer].    Please get an Xray today before you leave   Proceed to head CT ASAP

## 2021-10-18 NOTE — Progress Notes (Signed)
No fracture is visible on cervical spine x-ray.

## 2021-10-23 ENCOUNTER — Other Ambulatory Visit (HOSPITAL_COMMUNITY)
Admission: RE | Admit: 2021-10-23 | Discharge: 2021-10-23 | Disposition: A | Discharge status: Home / Self Care | Payer: Medicaid Other | Source: Ambulatory Visit | Attending: Adult Health | Admitting: Adult Health

## 2021-10-23 ENCOUNTER — Ambulatory Visit: Payer: Medicaid Other | Admitting: Adult Health

## 2021-10-23 ENCOUNTER — Encounter: Payer: Self-pay | Admitting: Adult Health

## 2021-10-23 VITALS — BP 120/88 | HR 97 | Temp 98.5°F | Ht 74.0 in

## 2021-10-23 DIAGNOSIS — F439 Reaction to severe stress, unspecified: Secondary | ICD-10-CM | POA: Diagnosis not present

## 2021-10-23 DIAGNOSIS — Z113 Encounter for screening for infections with a predominantly sexual mode of transmission: Secondary | ICD-10-CM | POA: Diagnosis not present

## 2021-10-23 DIAGNOSIS — R519 Headache, unspecified: Secondary | ICD-10-CM

## 2021-10-23 LAB — URINALYSIS
Bilirubin Urine: NEGATIVE
Ketones, ur: NEGATIVE
Leukocytes,Ua: NEGATIVE
Nitrite: NEGATIVE
Specific Gravity, Urine: 1.02 (ref 1.000–1.030)
Total Protein, Urine: NEGATIVE
Urine Glucose: NEGATIVE
Urobilinogen, UA: 0.2 (ref 0.0–1.0)
pH: 6 (ref 5.0–8.0)

## 2021-10-23 NOTE — Addendum Note (Signed)
Addended by: Apolinar Junes on: 10/23/2021 03:53 PM   Modules accepted: Orders

## 2021-10-23 NOTE — Progress Notes (Signed)
Subjective:    Patient ID: Maureen Lewis, female    DOB: [birth year], 26 y.o.   MRN: [id number]  HPI 26 year old female who  has a past medical history of Abnormal uterine bleeding, Acid reflux, Alpha thalassemia trait, Amenorrhea, Anemia, Asthma, Back pain, Cesarean delivery delivered (10/11/2019), Chest pain, Complication of anesthesia, Constipation, Depression, Fibromyalgia, Finger mass, left (03/2017), Gestational diabetes (05/23/2019), Gestational hypertension (09/23/2019), Headache, Hypertension, Infection, Irritable bowel syndrome (IBS), Joint pain, Morbid obesity with body mass index (BMI) of 45.0 to 49.9 in adult [facility]), Obesity during pregnancy, antepartum (04/06/2019), Plantar fasciitis, bilateral, Polycystic ovary syndrome, Pre-diabetes, Prediabetes, Pregnancy induced hypertension, Shortness of breath, Sleep apnea, Supervision of normal first pregnancy (04/06/2019), and Vertigo (2017).  She presents to the office today for follow up after being seen at [facility] eight days ago for evaluation of pain in her left leg after a fall earlier that morning.  She fell down 5 steps landing on her left side.  Pain was located in the anterior aspect and lateral aspect of the left ankle and anterior lateral aspect of the left knee.  She also had a constant headache since the fall, but was unsure if she hit her head during the fall.  Denies confusion or loss of function.  She was seen by sports medicine, Dr. Sherene Sires 2 days after being seen at urgent care.  At this time CT of the head was ordered due to worsening headache, and the patient not remembering the fall.  Her x-ray of neck was negative for fracture.  X-ray of the left knee was negative for fracture.  She has not received a call for the CT scan yet   She continues to have severe head pain on both sides of the temples and back of the head. Pain is not relieved by Tylenol/Motrin. Has nausea from time to time and sleeplessness.   She is  able to bear weight on her left foot with using the CAM boot.   Has a follow up with sports medicine tomorrow.   She would also like STD screening done before she is seen by her GYN this month. States "  it just does not feel right down there"   Review of Systems See HPI   Past Medical History:  Diagnosis Date   Abnormal uterine bleeding    Acid reflux    Alpha thalassemia trait    Amenorrhea    Anemia    no current med.   Asthma    Back pain    Cesarean delivery delivered 10/11/2019   10/09/2019 - primary CS for failed IOL   Chest pain    resolved   Complication of anesthesia    states had to keep giving her anesthesia during EGD   Constipation    Depression    "I'm good"   Fibromyalgia    Finger mass, left 03/2017   middle finger   Gestational diabetes 05/23/2019   hx - with pregnancy only    Gestational hypertension 09/23/2019   Guidelines for Antenatal Testing and Sonography  (with updated ICD-10 codes)  Updated  October 07, 2019 with Dr. Tama High  INDICATION U/S 2 X week NST/AFI  or full BPP wkly DELIVERY Diabetes   A1 - good control - O24.410    A2 - good control - O24.419      A2  - poor control or poor compliance - O24.419, E11.65   (Macrosomia or polyhydramnios) **E11.65 is extra code for poor control**  A2/B - O24.   Headache    Hypertension    Infection    UTI   Irritable bowel syndrome (IBS)    Joint pain    Morbid obesity with body mass index (BMI) of 45.0 to 49.9 in adult [facility])    Obesity during pregnancy, antepartum 04/06/2019   Body mass index is 53.71 kg/m.  Recommendations [x]  Aspirin 81 mg daily after 12 weeks; discontinue after 36 weeks [ ]  Nutrition consult [ ]  Weight gain 11-20 lbs for singleton and 25-35 lbs for twin pregnancy (IOM guidelines) Higher class of obesity patients recommended to gain closer to lower limit  Weight loss is associated with adverse outcomes [ ]  Baseline and surveillance labs (pulled in fr   Plantar fasciitis, bilateral     resolved   Polycystic ovary syndrome    Pre-diabetes    no meds, diet controlled, diet not check blood sugar   Prediabetes    "prediabetes"   Pregnancy induced hypertension    Shortness of breath    albuterol inhaler   Sleep apnea    no CPAP use   Supervision of normal first pregnancy 04/06/2019   [name]: [ x]Initial [ x]12 [ ] 20 [ ] 28 [ ] 32 [ ] 36 [ ] 38 [ ] 39 [ ] 40 Nursing Staff Provider Office Location [facility]  Dating   LMP Language  English  Anatomy US  Nml Flu Vaccine  Declined 07/28/19 Genetic Screen  NIPS: low risks   AFP:   negative  TDaP vaccine   08/25/19 Hgb A1C or  GTT Early A1C 5.8 Third trimester: GDM insulin Rhogam  NA   LAB RESULTS  Feeding Plan Breast Blood Type   Vertigo 2017    Social History   Socioeconomic History   Marital status: Single    Spouse name: Not on file   Number of children: Not on file   Years of education: Not on file   Highest education level: Not on file  Occupational History   Occupation: Chemical engineer    Employer: [id number]  Tobacco Use   Smoking status: Never   Smokeless tobacco: Never  Vaping Use   Vaping Use: Never used  Substance and Sexual Activity   Alcohol use: Not Currently    Comment: social    Drug use: No   Sexual activity: Yes    Birth control/protection: Condom  Other Topics Concern   Not on file  Social History Narrative   Right handed    Soda sometimes   Lives with mom and cousin, a grandmother in [place] also helps care for her.       She is in nursing school.    Social Determinants of Health   Financial Resource Strain: Not on file  Food Insecurity: Not on file  Transportation Needs: Not on file  Physical Activity: Not on file  Stress: Not on file  Social Connections: Not on file  Intimate Partner Violence: Not on file    Past Surgical History:  Procedure Laterality Date   CESAREAN SECTION N/A 10/09/2019   Procedure: CESAREAN SECTION;  Surgeon: Aletha Halim, MD;  Location: [facility];  Service: Obstetrics;  Laterality: N/A;   COLONOSCOPY WITH PROPOFOL  10/07/2016   DILATION AND CURETTAGE OF UTERUS     ESOPHAGOGASTRODUODENOSCOPY  12/21/2015   EXCISION MASS UPPER EXTREMETIES Left 04/21/2017   Procedure: EXCISION MASS LEFT MIDDLE FINGER;  Surgeon: Daryll Brod, MD;  Location: [place];  Service: Orthopedics;  Laterality: Left;   IR FL  GUIDED LOC OF NEEDLE/CATH TIP FOR SPINAL INJECTION RT  03/13/2020   PHOTOCOAGULATION WITH LASER Left 12/13/2020   Procedure: RETINOPLEXY LEFT EYE;  Surgeon: Sherlynn Stalls, MD;  Location: [place];  Service: Ophthalmology;  Laterality: Left;   SCLERAL BUCKLE Right 12/13/2020   Procedure: SCLERAL BUCKLE WITH CRYO;  Surgeon: Sherlynn Stalls, MD;  Location: [place];  Service: Ophthalmology;  Laterality: Right;    Family History  Problem Relation Age of Onset   Diabetes Maternal Aunt    Hypertension Maternal Uncle    Depression Maternal Grandfather    Diabetes Maternal Grandfather    Hypertension Maternal Grandfather    Arthritis Mother    High blood pressure Mother    Depression Mother    Sleep apnea Mother    Obesity Mother    Diabetes Mother    Hypertension Mother    Kidney failure Mother    Sleep apnea Father    Obesity Father    Healthy Daughter     Allergies  Allergen Reactions   Bee Pollen Hives, Shortness Of Breath and Swelling   Bee Venom Hives, Shortness Of Breath and Swelling   Hydrocodone-Acetaminophen Anaphylaxis    No problem when she takes Tylenol   Peach Flavor Hives, Shortness Of Breath and Other (See Comments)    SWELLING OF MOUTH   Pollen Extract Hives, Shortness Of Breath and Swelling   Remdesivir Swelling    Angioedema 8/25   Cortisone Itching and Swelling   Cortizone-10 [Hydrocortisone]     Swelling and itching    Current Outpatient Medications on File Prior to Visit  Medication Sig Dispense Refill   acetaZOLAMIDE ER (DIAMOX) 500 MG capsule Take by mouth.     amLODipine (NORVASC) 10 MG  tablet Take 1 tablet (10 mg total) by mouth daily. 90 tablet 3   benzonatate (TESSALON) 200 MG capsule Take by mouth.     Blood Pressure Monitoring (BLOOD PRESSURE MON/AUTO/WRIST) DEVI Use to monitor blood pressure 1 each 0   chlorhexidine (PERIDEX) 0.12 % solution SMARTSIG:By Mouth     diclofenac (VOLTAREN) 75 MG EC tablet Take 1 tablet (75 mg total) by mouth 2 (two) times daily. (Patient taking differently: Take 75 mg by mouth 2 (two) times daily as needed (pain).) 60 tablet 1   dicyclomine (BENTYL) 10 MG capsule dicyclomine 10 mg capsule     Diethylpropion HCl CR 75 MG TB24 Take by mouth.     Dulaglutide (TRULICITY) 3.71 GG/2.6RS SOPN Inject 0.75 mg into the skin every Wednesday.     fluticasone (FLONASE) 50 MCG/ACT nasal spray Place 1 spray into both nostrils daily. 16 g 2   fluticasone-salmeterol (ADVAIR HFA) 115-21 MCG/ACT inhaler Inhale 2 puffs into the lungs 2 (two) times daily. 3 each 3   gabapentin (NEURONTIN) 600 MG tablet Take 600 mg by mouth 3 (three) times daily.     hydrochlorothiazide (HYDRODIURIL) 25 MG tablet Take 1 tablet (25 mg total) by mouth daily. 90 tablet 3   ibuprofen (ADVIL) 600 MG tablet Take 1 tablet (600 mg total) by mouth every 6 (six) hours as needed. 30 tablet 0   levonorgestrel (KYLEENA) 19.5 MG IUD Kyleena 17.5 mcg/24 hrs (74yrs) 19.5mg  intrauterine device  Take 1 device by intrauterine route.     loratadine (CLARITIN) 10 MG tablet Take 10 mg by mouth daily as needed for allergies.      metroNIDAZOLE (FLAGYL) 500 MG tablet Take 1 tablet (500 mg total) by mouth 2 (two) times daily. 14 tablet 2  naproxen (NAPROSYN) 500 MG tablet Take 1 tablet (500 mg total) by mouth 2 (two) times daily with a meal. (Patient taking differently: Take 500 mg by mouth 2 (two) times daily as needed (pain).) 30 tablet 0   ondansetron (ZOFRAN-ODT) 8 MG disintegrating tablet ondansetron 8 mg disintegrating tablet     oxyCODONE-acetaminophen (PERCOCET/ROXICET) 5-325 MG tablet Take 1 tablet  by mouth 5 (five) times daily.     pantoprazole (PROTONIX) 40 MG tablet Take 1 tablet (40 mg total) by mouth daily. 90 tablet 0   polyethylene glycol powder (GLYCOLAX/MIRALAX) 17 GM/SCOOP powder Take 17 g by mouth daily as needed for moderate constipation.      SUMAtriptan (IMITREX) 50 MG tablet Take 1 tablet (50 mg total) by mouth every 2 (two) hours as needed for migraine. May repeat in 2 hours if headache persists or recurs. 10 tablet 1   tiZANidine (ZANAFLEX) 4 MG tablet Take 4 mg by mouth 3 (three) times daily as needed.     topiramate (TOPAMAX) 50 MG tablet Take by mouth.     Vitamin D, Ergocalciferol, (DRISDOL) 1.25 MG (50000 UNIT) CAPS capsule Take 1 capsule (50,000 Units total) by mouth every 7 (seven) days. (Patient taking differently: Take 50,000 Units by mouth every Monday.) 4 capsule 0   ferrous sulfate 325 (65 FE) MG tablet Take 1 tablet (325 mg total) by mouth every other day. 45 tablet 1   No current facility-administered medications on file prior to visit.    BP 120/88    Pulse 97    Temp 98.5 F (36.9 C) (Oral)    Ht 6\' 2"  (1.88 m)    LMP 10/02/2021    SpO2 98%    BMI 62.66 kg/m       Objective:   Physical Exam Vitals and nursing note reviewed.  Constitutional:      Appearance: Normal appearance. She is obese.  Musculoskeletal:        General: Normal range of motion.  Skin:    General: Skin is warm and dry.     Capillary Refill: Capillary refill takes less than 2 seconds.  Neurological:     General: No focal deficit present.     Mental Status: She is alert and oriented to person, place, and time. Mental status is at baseline.  Psychiatric:        Mood and Affect: Mood normal.        Behavior: Behavior normal.        Thought Content: Thought content normal.        Judgment: Judgment normal.      Assessment & Plan:  1. Acute nonintractable headache, unspecified headache type - Spoke to Dr. [name] and advised that I would order CT head today so that we can  hopefully have it done for tomorrow at her follow up.  - Likely concussion. Advised rest, refrain from bright lights and OTC anelgesics  - CT HEAD WO CONTRAST (5MM); Future  2. Screening examination for STD (sexually transmitted disease)  - Urine cytology ancillary only; Future - Urinalysis; Future - Urinalysis - Urine cytology ancillary only  Dorothyann Peng, NP

## 2021-10-24 ENCOUNTER — Ambulatory Visit: Payer: Medicaid Other | Admitting: Family Medicine

## 2021-10-24 ENCOUNTER — Telehealth: Payer: Self-pay | Admitting: Adult Health

## 2021-10-24 ENCOUNTER — Encounter: Payer: Self-pay | Admitting: Adult Health

## 2021-10-24 LAB — URINE CYTOLOGY ANCILLARY ONLY
Candida Urine: NEGATIVE
Chlamydia: NEGATIVE
Comment: NEGATIVE
Comment: NEGATIVE
Comment: NORMAL
Neisseria Gonorrhea: NEGATIVE
Trichomonas: NEGATIVE

## 2021-10-24 NOTE — Progress Notes (Deleted)
Subjective:    Chief Complaint: Maureen Lewis, LAT, ATC, am serving as scribe for Dr. [name].  Maureen Lewis,  is a 26 y.o. female who presents for f/u of concussion and L-sided lower body pain (L knee and L ankle specifically) after suffering a fall down 5 steps on 10/15/21.  She was last seen by Dr. Georgina Snell on 10/17/21 and noted worsening HA and stat head CT was ordered.  She was also advised to purchase a CAM walker boot for her ankle sprain.  Today, pt reports     Injury date : 10/15/21 Visit #: 2  Diagnostic testing: C-spine XR- 10/17/21; L hip, L knee and L ankle XR- 10/15/21  History of Present Illness:    Concussion Self-Reported Symptom Score Symptoms rated on a scale 1-6, in last 24 hours   Headache: ***    Nausea: ***  Dizziness: ***  Vomiting: ***  Balance Difficulty: ***   Trouble Falling Asleep: ***   Fatigue: ***  Sleep Less Than Usual: ***  Daytime Drowsiness: ***  Sleep More Than Usual: ***  Photophobia: ***  Phonophobia: ***  Irritability: ***  Sadness: ***  Numbness or Tingling: ***  Nervousness: ***  Feeling More Emotional: ***  Feeling Mentally Foggy: ***  Feeling Slowed Down: ***  Memory Problems: ***  Difficulty Concentrating: ***  Visual Problems: ***   Total Symptom Score: ***   Neck Pain: Yes/No  Tinnitus: Yes/No  Review of Systems:  ***    Review of History: ***  Objective:    Physical Examination There were no vitals filed for this visit. MSK:  *** Neuro: *** Psych: ***     Imaging:  ***  Assessment and Plan   26 y.o. female with ***    ***    Action/Discussion: Reviewed diagnosis, management options, expected outcomes, and the reasons for scheduled and emergent follow-up. Questions were adequately answered. Patient expressed verbal understanding and agreement with the following plan.     Patient Education: Reviewed with patient the risks (i.e, a repeat concussion, post-concussion syndrome,  second-impact syndrome) of returning to play prior to complete resolution, and thoroughly reviewed the signs and symptoms of concussion.Reviewed need for complete resolution of all symptoms, with rest AND exertion, prior to return to play. Reviewed red flags for urgent medical evaluation: worsening symptoms, nausea/vomiting, intractable headache, musculoskeletal changes, focal neurological deficits. [facility] Concussion Care Plan, which clearly outlines the plans stated above, was given to patient.   Level of service: ***     After Visit Summary printed out and provided to patient as appropriate.  The above documentation has been reviewed and is accurate and complete Wendy Poet

## 2021-10-24 NOTE — Telephone Encounter (Signed)
Maureen Lewis notified of update via teams of message below. She stated that the order is still in. No further action needed.

## 2021-10-24 NOTE — Telephone Encounter (Signed)
Maureen Lewis stated that he accidentally deleted the order that the Sports doctor put in. Maureen Lewis stated if there is a way to put the order back in that was deleted to keep it under the same doctor then we can do it that way.

## 2021-10-24 NOTE — Telephone Encounter (Signed)
Called pt went over lab results and advised that the other labs are not in yet. Pt verbalized understanding.

## 2021-10-24 NOTE — Telephone Encounter (Signed)
[name] from [facility] order this same procedure for this PATIENT  CT HEAD WO CM - is it ok to cancel your order because the pt has been authorized and scheduled for the CT  from dr Georgina Snell evan order  please advised

## 2021-10-25 ENCOUNTER — Ambulatory Visit (HOSPITAL_BASED_OUTPATIENT_CLINIC_OR_DEPARTMENT_OTHER): Payer: Medicaid Other

## 2021-10-28 ENCOUNTER — Telehealth: Payer: Self-pay | Admitting: Adult Health

## 2021-10-28 DIAGNOSIS — Z713 Dietary counseling and surveillance: Secondary | ICD-10-CM | POA: Diagnosis not present

## 2021-10-28 NOTE — Telephone Encounter (Signed)
Patient called to follow up on PA for CT scan. Patient states they have told her there is no PA in and she cannot be scheduled. Patient would like an update on PA to know when she could possibly scheduled for.     Good callback number is [phone]      Please advise

## 2021-10-29 ENCOUNTER — Ambulatory Visit: Payer: Medicaid Other | Admitting: Family Medicine

## 2021-10-29 ENCOUNTER — Telehealth: Payer: Self-pay | Admitting: Adult Health

## 2021-10-29 ENCOUNTER — Telehealth: Payer: Self-pay | Admitting: Family Medicine

## 2021-10-29 DIAGNOSIS — S0990XA Unspecified injury of head, initial encounter: Secondary | ICD-10-CM

## 2021-10-29 DIAGNOSIS — R519 Headache, unspecified: Secondary | ICD-10-CM

## 2021-10-29 NOTE — Telephone Encounter (Signed)
Spoke with staff at Dorothyann Peng, NP office this morning about Maureen Lewis's head CT scan. The point of the head CT scan was to try to eval acute headache after a head injury. Now two weeks later I think the time for a stat head CT scan has passed.  She is scheduled to see me today.  If she shows up I plan to discuss this with her and either cancel the head CT scan or change it to a MRI.   If she does not show up and calls back I would recommend referring her back to me.

## 2021-10-29 NOTE — Telephone Encounter (Signed)
Pt is scheduled to see Dr. Sherene Sires this am. Spoke to [facility] and he advised that he will f/u with her today to make sure everything is okay. Dr. Amalia Hailey stated that if pt is still having issues or concerns he will order an MRI if appropriate.

## 2021-10-29 NOTE — Telephone Encounter (Signed)
I called Maureen Lewis. She overslept my appointment today. She still has a bad headache.  Plan to switch the CT to an MRI as it will be more helpful at this point.  Reschedule the visit for 1/17

## 2021-10-29 NOTE — Progress Notes (Deleted)
Subjective:    Chief Complaint: Maureen Lewis, LAT, ATC, am serving as scribe for Dr. Lynne Lewis.  Maureen Lewis,  is a 26 y.o. female who presents for f/u of a head injury, HA, L knee and L ankle pain that occurred after suffering a fall down 5 steps, landing on her L side on 10/15/21.  She was seen at the [facility] on 10/15/21 and had XR of her L ankle, knee and hip and was then seen by Dr. Georgina Lewis on 10/17/21.  Due to worsening HA and MOI, pt was referred for a stat head CT that is not scheduled until 11/05/21.  She was advised to purchase a CAM walker boot for her L ankle and to use Voltaren gel on her L knee.  Today, pt reports   Injury date : 10/15/21 Visit #: 2  Diagnostic testing: C-spine XR- 10/17/21; L hip, knee and ankle XR- 10/15/21 History of Present Illness:    Concussion Self-Reported Symptom Score Symptoms rated on a scale 1-6, in last 24 hours   Headache: ***    Nausea: ***  Dizziness: ***  Vomiting: ***  Balance Difficulty: ***   Trouble Falling Asleep: ***   Fatigue: ***  Sleep Less Than Usual: ***  Daytime Drowsiness: ***  Sleep More Than Usual: ***  Photophobia: ***  Phonophobia: ***  Irritability: ***  Sadness: ***  Numbness or Tingling: ***  Nervousness: ***  Feeling More Emotional: ***  Feeling Mentally Foggy: ***  Feeling Slowed Down: ***  Memory Problems: ***  Difficulty Concentrating: ***  Visual Problems: ***   Total # of Symptoms: Total Symptom Score: ***   Neck Pain: Yes/No  Tinnitus: Yes/No  Review of Systems:  ***    Review of History: ***  Objective:    Physical Examination There were no vitals filed for this visit. MSK:  *** Neuro: *** Psych: ***     Imaging:  ***  Assessment and Plan   26 y.o. female with ***    ***    Action/Discussion: Reviewed diagnosis, management options, expected outcomes, and the reasons for scheduled and emergent follow-up. Questions were adequately answered.  Patient expressed verbal understanding and agreement with the following plan.     Patient Education: Reviewed with patient the risks (i.e, a repeat concussion, post-concussion syndrome, second-impact syndrome) of returning to play prior to complete resolution, and thoroughly reviewed the signs and symptoms of concussion.Reviewed need for complete resolution of all symptoms, with rest AND exertion, prior to return to play. Reviewed red flags for urgent medical evaluation: worsening symptoms, nausea/vomiting, intractable headache, musculoskeletal changes, focal neurological deficits. [facility]'s Concussion Care Plan, which clearly outlines the plans stated above, was given to patient.   Level of service: ***     After Visit Summary printed out and provided to patient as appropriate.  The above documentation has been reviewed and is accurate and complete Wendy Poet

## 2021-10-30 ENCOUNTER — Telehealth: Payer: Self-pay | Admitting: Family Medicine

## 2021-10-30 MED ORDER — LORAZEPAM 0.5 MG PO TABS: ORAL_TABLET | ORAL | 0 refills | Status: DC

## 2021-10-30 NOTE — Telephone Encounter (Signed)
Ativan prescribed for MRI claustrophobia.

## 2021-11-01 ENCOUNTER — Telehealth: Payer: Self-pay | Admitting: Adult Health

## 2021-11-01 NOTE — Telephone Encounter (Signed)
Received teams from  [1:21 PM] Joycie Peek Hi [place]. Need an Josem Kaufmann for Maureen Lewis MRN [id number] Ct Head without contrast  The patient is scheduled for 1/17. I called to inform Gwenyth Ober to explain that the pt is back on the schedule again. Sherene Sires ordered the same CT for the patient, and Gregor Hams office authorized it, and then [employer] placed the  same  order; which caused duplicate orders, which made it harder to approve since the pt was already scheduled with Georgina Snell Evan's office,  I spoke with Tillie Rung to inform patient is on the schedule; she stated that the CT That Georgina Snell ordered is supposed to be canceled; pt has been told that the doctor thinks this procedure will be canceled; he noted this is no longer needed, and Tillie Rung said she would call the patient to let her know.

## 2021-11-03 ENCOUNTER — Other Ambulatory Visit: Payer: Medicaid Other

## 2021-11-04 DIAGNOSIS — N915 Oligomenorrhea, unspecified: Secondary | ICD-10-CM | POA: Diagnosis not present

## 2021-11-04 DIAGNOSIS — Z6841 Body Mass Index (BMI) 40.0 and over, adult: Secondary | ICD-10-CM | POA: Diagnosis not present

## 2021-11-04 DIAGNOSIS — Z79899 Other long term (current) drug therapy: Secondary | ICD-10-CM | POA: Diagnosis not present

## 2021-11-04 DIAGNOSIS — M25571 Pain in right ankle and joints of right foot: Secondary | ICD-10-CM | POA: Diagnosis not present

## 2021-11-04 DIAGNOSIS — M419 Scoliosis, unspecified: Secondary | ICD-10-CM | POA: Diagnosis not present

## 2021-11-04 DIAGNOSIS — M545 Low back pain, unspecified: Secondary | ICD-10-CM | POA: Diagnosis not present

## 2021-11-04 DIAGNOSIS — M542 Cervicalgia: Secondary | ICD-10-CM | POA: Diagnosis not present

## 2021-11-05 ENCOUNTER — Encounter: Payer: Self-pay | Admitting: Family Medicine

## 2021-11-05 ENCOUNTER — Other Ambulatory Visit: Payer: Self-pay

## 2021-11-05 ENCOUNTER — Ambulatory Visit (INDEPENDENT_AMBULATORY_CARE_PROVIDER_SITE_OTHER): Payer: Medicaid Other | Admitting: Family Medicine

## 2021-11-05 ENCOUNTER — Ambulatory Visit: Payer: Medicaid Other | Admitting: Obstetrics & Gynecology

## 2021-11-05 ENCOUNTER — Ambulatory Visit (HOSPITAL_BASED_OUTPATIENT_CLINIC_OR_DEPARTMENT_OTHER): Payer: Medicaid Other

## 2021-11-05 VITALS — BP 128/84 | HR 98 | Ht 74.0 in | Wt >= 6400 oz

## 2021-11-05 DIAGNOSIS — S060X9D Concussion with loss of consciousness of unspecified duration, subsequent encounter: Secondary | ICD-10-CM | POA: Diagnosis not present

## 2021-11-05 DIAGNOSIS — M25562 Pain in left knee: Secondary | ICD-10-CM

## 2021-11-05 DIAGNOSIS — M25572 Pain in left ankle and joints of left foot: Secondary | ICD-10-CM

## 2021-11-05 DIAGNOSIS — R519 Headache, unspecified: Secondary | ICD-10-CM | POA: Diagnosis not present

## 2021-11-05 MED ORDER — NORTRIPTYLINE HCL 25 MG PO CAPS: 25.0000 mg | ORAL_CAPSULE | Freq: Every day | ORAL | 2 refills | Status: DC

## 2021-11-05 NOTE — Patient Instructions (Addendum)
Thank you for coming in today.   Nortriptyline 25 mg at bedtime.  I've referred you to Physical Therapy for your L knee.  Their office will call you to schedule but please let us know if you haven't heard from them in one week regarding scheduling.  Follow-up: one month

## 2021-11-05 NOTE — Progress Notes (Signed)
Subjective:    Chief Complaint: [name], LAT, ATC, am serving as scribe for Dr. [name].  Maureen Lewis,  is a 26 y.o. female who presents for f/u of concussion and L knee and L ankle pain after suffering a fall down 5 steps on 10/15/21. She was seen at the [facility] on 10/15/21 and had XR of her L ankle, knee and hip. She was then seen by Dr. Georgina Snell on 10/17/21 c/o constant HA since the fall and con't L lateral knee and L lateral ankle pain.  She was advised to use a CAM walker boot and compression for her ankle and Voltaren gel for her knee.  Today, pt reports not much improvement. Pt c/o continued HA and forgetfulness. Pt also c/o continue L ankle and knee are still painful and swollen. Pt notes mechanical symptoms in her L knee. Pt reports she hasn't been called to schedule the brain MRI (MRI was scheduled for January 15 but she did not show up.)  Injury date : 10/15/21 Visit #: 2  Diagnostic testing: C-spine XR- 10/17/21; L hip, L knee and L ankle XR- 10/15/21  History of Present Illness:   Concussion Self-Reported Symptom Score Symptoms rated on a scale 1-6, in last 24 hours   Headache: 6    Nausea: 1  Dizziness: 4  Vomiting: 0  Balance Difficulty: 2   Trouble Falling Asleep: 6   Fatigue: 6  Sleep Less Than Usual: 6  Daytime Drowsiness: 6  Sleep More Than Usual: 0  Photophobia: 5  Phonophobia: 3  Irritability: 6  Sadness: 0  Numbness or Tingling: 5  Nervousness: 0  Feeling More Emotional: 4  Feeling Mentally Foggy: 5  Feeling Slowed Down: 5  Memory Problems: 5  Difficulty Concentrating: 3  Visual Problems: 2   Total # of Symptoms: 18/22 Total Symptom Score: 80/132  Neck Pain: No Tinnitus: Yes- intermittently bilat  Review of Systems: No fevers or chills    Review of History: Prior concussion history.  Allergy to steroid injection causing swelling. Chronic headaches thought not to be pseudotumor cerebri based on normal opening  pressure.  Objective:    Physical Examination Vitals:   11/05/21 1017  BP: 128/84  Pulse: 98  SpO2: 96%   MSK: Left knee: Normal-appearing Tender palpation anterior knee normal knee motion with crepitation.  Stable ligamentous exam. Left Ankle in cam walker boot. Neuro: Alert and oriented.  Photophobia.  Responds appropriately to questions.  Normal coordination. Psych: Normal speech thought process and affect.     Imaging:  EXAM: CERVICAL SPINE - COMPLETE 4+ VIEW   COMPARISON:  01/09/2021   FINDINGS: Straightening of the cervical spine. Suboptimal visualization C6 and below. Dens and lateral masses are within normal limits.   IMPRESSION: Straightening of the cervical spine with poor visibility C6 and below. Imaged upper cervical spine within normal limits.     Electronically Signed   By: Donavan Foil M.D.   On: 10/17/2021 23:20    EXAM: LEFT KNEE 3 VIEWS   COMPARISON:  None.   FINDINGS: No evidence of fracture, dislocation, or joint effusion. No evidence of arthropathy or other focal bone abnormality. Soft tissues are unremarkable.   IMPRESSION: Negative.     Electronically Signed   By: Jorje Guild M.D.   On: 10/15/2021 10:15 I, [name], personally (independently) visualized and performed the interpretation of the images attached in this note.   Assessment and Plan   26 y.o. female with  concussion and headache after fall and left knee pain and ankle pain after a fall.  Headache is the dominant issue.  She had enough energy with this fall that she could have more significant injury in her brain that we have recognized.  Neuroimaging has been a significant challenge.  At this time at the last visit we elected to discontinue CT scan and proceed to brain MRI as her neurologic exam is nonfocal but she was still having severe headache.  She was other unaware of her MRI or missed it was confused.  I called the MRI location and got her scheduled for  January 22.  She is aware of when her MRI is and where it is and should be able to attend.  She does not exceed the table weight limit (550 lbs) for her MRI but there is some concern of whether or not she will actually fit in the MRI scanner.  We will see.  Treat headache with nortriptyline for now.  Knee and ankle pain.  No fracture on x-ray. Plan for PT.   Recheck in 1 month.     Action/Discussion: Reviewed diagnosis, management options, expected outcomes, and the reasons for scheduled and emergent follow-up. Questions were adequately answered. Patient expressed verbal understanding and agreement with the following plan.     Patient Education: Reviewed with patient the risks (i.e, a repeat concussion, post-concussion syndrome, second-impact syndrome) of returning to play prior to complete resolution, and thoroughly reviewed the signs and symptoms of concussion.Reviewed need for complete resolution of all symptoms, with rest AND exertion, prior to return to play. Reviewed red flags for urgent medical evaluation: worsening symptoms, nausea/vomiting, intractable headache, musculoskeletal changes, focal neurological deficits. [facility] Concussion Care Plan, which clearly outlines the plans stated above, was given to patient.   Level of service: Total encounter time 40 minutes including face-to-face time with the patient and, reviewing past medical record, and charting on the date of service.        After Visit Summary printed out and provided to patient as appropriate.  The above documentation has been reviewed and is accurate and complete [name]

## 2021-11-08 DIAGNOSIS — R0789 Other chest pain: Secondary | ICD-10-CM | POA: Diagnosis not present

## 2021-11-08 DIAGNOSIS — I10 Essential (primary) hypertension: Secondary | ICD-10-CM | POA: Diagnosis not present

## 2021-11-08 DIAGNOSIS — Z79899 Other long term (current) drug therapy: Secondary | ICD-10-CM | POA: Diagnosis not present

## 2021-11-08 DIAGNOSIS — R002 Palpitations: Secondary | ICD-10-CM | POA: Diagnosis not present

## 2021-11-08 DIAGNOSIS — R0602 Shortness of breath: Secondary | ICD-10-CM | POA: Diagnosis not present

## 2021-11-08 NOTE — Progress Notes (Deleted)
Office Visit Note  Patient: Maureen Lewis             Date of Birth: 1996/05/07           MRN: [id number]             PCP: Dorothyann Peng, NP Referring: Dorothyann Peng, NP Visit Date: 11/19/2021 Occupation: @GUAROCC @  Subjective:  No chief complaint on file.   History of Present Illness: Maureen Lewis is a 26 y.o. female ***   Activities of Daily Living:  Patient reports morning stiffness for *** {minute/hour:19697}.   Patient {ACTIONS;DENIES/REPORTS:21021675::"Denies"} nocturnal pain.  Difficulty dressing/grooming: {ACTIONS;DENIES/REPORTS:21021675::"Denies"} Difficulty climbing stairs: {ACTIONS;DENIES/REPORTS:21021675::"Denies"} Difficulty getting out of chair: {ACTIONS;DENIES/REPORTS:21021675::"Denies"} Difficulty using hands for taps, buttons, cutlery, and/or writing: {ACTIONS;DENIES/REPORTS:21021675::"Denies"}  No Rheumatology ROS completed.   PMFS History:  Patient Active Problem List   Diagnosis Date Noted   IUD check up 05/15/2021   Encounter for insertion of mirena IUD 04/01/2021   Levator spasm 11/21/2020   Galactorrhea 11/19/2020   Hx of migraine headaches 04/09/2020   Contraceptive management 11/22/2019   Positive RPR test 10/09/2019   HTN (hypertension) 09/23/2019   Migraine headache 07/12/2019   Alpha thalassemia trait    Vitamin D deficiency 09/16/2017   Prediabetes 09/16/2017   Depression 09/16/2017   Class 3 severe obesity with serious comorbidity and body mass index (BMI) of 50.0 to 59.9 in adult [facility]) 09/16/2017   PCOS (polycystic ovarian syndrome) 08/13/2017   Mass 05/06/2017   Vertigo 10/06/2016   Microcytic anemia 09/26/2016   Obesity hypoventilation syndrome ([place]) 08/28/2016   Benign paroxysmal positional vertigo due to bilateral vestibular disorder 08/28/2016   OSA (obstructive sleep apnea) 08/28/2016   Hypersomnia with sleep apnea 08/28/2016   Female hirsutism 08/28/2016   GERD (gastroesophageal reflux disease)  02/24/2015   Constipation 10/02/2014    Past Medical History:  Diagnosis Date   Abnormal uterine bleeding    Acid reflux    Alpha thalassemia trait    Amenorrhea    Anemia    no current med.   Asthma    Back pain    Cesarean delivery delivered 10/11/2019   10/09/2019 - primary CS for failed IOL   Chest pain    resolved   Complication of anesthesia    states had to keep giving her anesthesia during EGD   Constipation    Depression    "I'm good"   Fibromyalgia    Finger mass, left 03/2017   middle finger   Gestational diabetes 05/23/2019   hx - with pregnancy only    Gestational hypertension 09/23/2019   Guidelines for Antenatal Testing and Sonography  (with updated ICD-10 codes)  Updated  10/05/19 with Dr. Tama High  INDICATION U/S 2 X week NST/AFI  or full BPP wkly DELIVERY Diabetes   A1 - good control - O24.410    A2 - good control - O24.419      A2  - poor control or poor compliance - O24.419, E11.65   (Macrosomia or polyhydramnios) **E11.65 is extra code for poor control**    A2/B - O24.   Headache    Hypertension    Infection    UTI   Irritable bowel syndrome (IBS)    Joint pain    Morbid obesity with body mass index (BMI) of 45.0 to 49.9 in adult [facility])    Obesity during pregnancy, antepartum 04/06/2019   Body mass index is 53.71 kg/m.  Recommendations [x]  Aspirin 81 mg daily after 12  weeks; discontinue after 36 weeks $Remove'[ ]'HttVFqm$  Nutrition consult $RemoveBeforeDEI'[ ]'VnooHMOhdCTifubX$  Weight gain 11-20 lbs for singleton and 25-35 lbs for twin pregnancy (IOM guidelines) Higher class of obesity patients recommended to gain closer to lower limit  Weight loss is associated with adverse outcomes $RemoveBeforeDE'[ ]'uWQtZEbXVaVUWLe$  Baseline and surveillance labs (pulled in fr   Plantar fasciitis, bilateral    resolved   Polycystic ovary syndrome    Pre-diabetes    no meds, diet controlled, diet not check blood sugar   Prediabetes    "prediabetes"   Pregnancy induced hypertension    Shortness of breath    albuterol inhaler   Sleep apnea     no CPAP use   Supervision of normal first pregnancy 04/06/2019   [name]: [ x]Initial [ x]12 $Remo'[ ]'Zuebn$ 20 $Remo'[ ]'tvFhx$ 28 $Remo'[ ]'sxuob$ 32 $Remo'[ ]'GhHTc$ 36 $Remo'[ ]'JKdfz$ 38 $Remo'[ ]'WosAb$ 39 $Remo'[ ]'FJPdL$ 40 Nursing Staff Provider Office Location [facility]  Dating   LMP Language  English  Anatomy US  Nml Flu Vaccine  Declined 07/28/19 Genetic Screen  NIPS: low risks   AFP:   negative  TDaP vaccine   08/25/19 Hgb A1C or  GTT Early A1C 5.8 Third trimester: GDM insulin Rhogam  NA   LAB RESULTS  Feeding Plan Breast Blood Type   Vertigo 2017    Family History  Problem Relation Age of Onset   Diabetes Maternal Aunt    Hypertension Maternal Uncle    Depression Maternal Grandfather    Diabetes Maternal Grandfather    Hypertension Maternal Grandfather    Arthritis Mother    High blood pressure Mother    Depression Mother    Sleep apnea Mother    Obesity Mother    Diabetes Mother    Hypertension Mother    Kidney failure Mother    Sleep apnea Father    Obesity Father    Healthy Daughter    Past Surgical History:  Procedure Laterality Date   CESAREAN SECTION N/A 10/09/2019   Procedure: CESAREAN SECTION;  Surgeon: Aletha Halim, MD;  Location: [facility];  Service: Obstetrics;  Laterality: N/A;   COLONOSCOPY WITH PROPOFOL  10/07/2016   DILATION AND CURETTAGE OF UTERUS     ESOPHAGOGASTRODUODENOSCOPY  12/21/2015   EXCISION MASS UPPER EXTREMETIES Left 04/21/2017   Procedure: EXCISION MASS LEFT MIDDLE FINGER;  Surgeon: Daryll Brod, MD;  Location: [place];  Service: Orthopedics;  Laterality: Left;   IR FL GUIDED LOC OF NEEDLE/CATH TIP FOR SPINAL INJECTION RT  03/13/2020   PHOTOCOAGULATION WITH LASER Left 12/13/2020   Procedure: RETINOPLEXY LEFT EYE;  Surgeon: Sherlynn Stalls, MD;  Location: [place];  Service: Ophthalmology;  Laterality: Left;   SCLERAL BUCKLE Right 12/13/2020   Procedure: SCLERAL BUCKLE WITH CRYO;  Surgeon: Sherlynn Stalls, MD;  Location: [place];  Service: Ophthalmology;  Laterality: Right;   Social History   Social History  Narrative   Right handed    Soda sometimes   Lives with mom and cousin, a grandmother in [place] also helps care for her.       She is in nursing school.    Immunization History  Administered Date(s) Administered   HPV Quadrivalent 05/20/2012, 07/22/2012, 08/23/2013   Influenza,inj,Quad PF,6+ Mos 08/23/2013, 07/18/2014, 08/25/2019, 09/07/2020   PFIZER(Purple Top)SARS-COV-2 Vaccination 07/24/2020, 08/17/2020   PPD Test 11/14/2020   Tdap 07/12/2009, 08/25/2019     Objective: Vital Signs: There were no vitals taken for this visit.   Physical Exam   Musculoskeletal Exam: ***  CDAI Exam: CDAI Score: -- Patient  Global: --; Provider Global: -- Swollen: --; Tender: -- Joint Exam 11/19/2021   No joint exam has been documented for this visit   There is currently no information documented on the homunculus. Go to the Rheumatology activity and complete the homunculus joint exam.  Investigation: No additional findings.  Imaging: DG Cervical Spine Complete  Result Date: 10/17/2021 CLINICAL DATA:  Neck pain EXAM: CERVICAL SPINE - COMPLETE 4+ VIEW COMPARISON:  01/09/2021 FINDINGS: Straightening of the cervical spine. Suboptimal visualization C6 and below. Dens and lateral masses are within normal limits. IMPRESSION: Straightening of the cervical spine with poor visibility C6 and below. Imaged upper cervical spine within normal limits. Electronically Signed   By: Donavan Foil M.D.   On: 10/17/2021 23:20   DG Ankle Complete Left  Result Date: 10/15/2021 CLINICAL DATA:  Fall down steps with left-sided extremity pain EXAM: LEFT ANKLE COMPLETE - 3+ VIEW COMPARISON:  03/14/2021 FINDINGS: Lateral soft tissue swelling.  No acute fracture or subluxation. Subcutaneous angulated foreign body measuring 8 mm in length in the lateral plantar soft tissues. This is new from the comparison study. IMPRESSION: No acute fracture or subluxation. 8 mm long foreign body in the plantar subcutaneous tissues.  Electronically Signed   By: Jorje Guild M.D.   On: 10/15/2021 10:15   DG Hip Unilat With Pelvis 2-3 Views Left  Result Date: 10/15/2021 CLINICAL DATA:  Fall down steps with pain. EXAM: DG HIP (WITH OR WITHOUT PELVIS) 2-3V LEFT COMPARISON:  None. FINDINGS: Very limited study due to body habitus and underpenetration. No detected dislocation or fracture. IMPRESSION: Essentially nondiagnostic evaluation of the hips due to soft tissue attenuation. Electronically Signed   By: Jorje Guild M.D.   On: 10/15/2021 10:16   DG Knee AP/LAT W/Sunrise Left  Result Date: 10/15/2021 CLINICAL DATA:  Fall down steps with left-sided pain. EXAM: LEFT KNEE 3 VIEWS COMPARISON:  None. FINDINGS: No evidence of fracture, dislocation, or joint effusion. No evidence of arthropathy or other focal bone abnormality. Soft tissues are unremarkable. IMPRESSION: Negative. Electronically Signed   By: Jorje Guild M.D.   On: 10/15/2021 10:15    Recent Labs: Lab Results  Component Value Date   WBC 6.4 06/09/2021   HGB 13.1 06/09/2021   PLT 275 06/09/2021   NA 140 06/09/2021   K 4.7 06/09/2021   CL 105 06/09/2021   CO2 28 06/09/2021   GLUCOSE 109 (H) 06/09/2021   BUN 12 06/09/2021   CREATININE 0.67 06/09/2021   BILITOT 0.2 05/06/2021   ALKPHOS 118 05/06/2021   AST 12 05/06/2021   ALT 19 05/06/2021   PROT 6.9 05/06/2021   ALBUMIN 4.0 05/06/2021   CALCIUM 9.6 06/09/2021   GFRAA 141 06/26/2020    Speciality Comments: No specialty comments available.  Procedures:  No procedures performed Allergies: Bee pollen, Bee venom, Hydrocodone-acetaminophen, Peach flavor, Pollen extract, Remdesivir, Cortisone, and Cortizone-10 [hydrocortisone]   Assessment / Plan:     Visit Diagnoses: Positive ANA (antinuclear antibody) - Feb 21, 2020 ANA 1: 40 speckled.  Not a significant titer.  She had no clinical features of lupus.   Pain in both hands - Xrays were unremarkable.  RF negative, anti-CCP was negative.  ESR 25 mildly  elevated.  Myalgia - CK normal  Essential hypertension  Prediabetes  History of gastroesophageal reflux (GERD)  Hx of migraine headaches  Benign paroxysmal positional vertigo due to bilateral vestibular disorder  Pseudotumor cerebri syndrome  OSA (obstructive sleep apnea)  Hypersomnia with sleep apnea  Obesity hypoventilation  syndrome ([place])  Acute hypoxemic respiratory failure due to COVID-19 (HCC)  Microcytic anemia  Vitamin D deficiency  Alpha thalassemia trait  PCOS (polycystic ovarian syndrome)  Female hirsutism  Orders: No orders of the defined types were placed in this encounter.  No orders of the defined types were placed in this encounter.   Face-to-face time spent with patient was *** minutes. Greater than 50% of time was spent in counseling and coordination of care.  Follow-Up Instructions: No follow-ups on file.   Ofilia Neas, PA-C  Note - This record has been created using Dragon software.  Chart creation errors have been sought, but may not always  have been located. Such creation errors do not reflect on  the standard of medical care.

## 2021-11-10 ENCOUNTER — Other Ambulatory Visit: Payer: Medicaid Other

## 2021-11-11 ENCOUNTER — Telehealth: Payer: Self-pay | Admitting: Adult Health

## 2021-11-11 NOTE — Telephone Encounter (Signed)
Pt call and stated she want Maureen Lewis to give her a call back to discuss if she can take Nebivolol .

## 2021-11-12 ENCOUNTER — Other Ambulatory Visit: Payer: Self-pay | Admitting: Adult Health

## 2021-11-12 NOTE — Telephone Encounter (Signed)
Spoke with the patient and she stated the cardiologist requests to change from Nevibovol to Carvedilol due to insurance denial.  States cardiologist wanted her to check with PCP as both medications may not be indicated due to containing similar ingredients as Hydrocodone, which she is allergic to?  Message sent to PCP.

## 2021-11-12 NOTE — Telephone Encounter (Signed)
Patient informed of the message below.  Appt scheduled for 2/1 per patient's request to follow up from visit with cardiologist.

## 2021-11-15 ENCOUNTER — Ambulatory Visit: Payer: Medicaid Other | Admitting: Obstetrics & Gynecology

## 2021-11-15 ENCOUNTER — Other Ambulatory Visit: Payer: Self-pay

## 2021-11-18 ENCOUNTER — Ambulatory Visit: Payer: Medicaid Other | Admitting: Physician Assistant

## 2021-11-18 ENCOUNTER — Ambulatory Visit: Payer: Medicaid Other | Admitting: Advanced Practice Midwife

## 2021-11-18 NOTE — Progress Notes (Deleted)
Office Visit Note  Patient: Maureen Lewis             Date of Birth: 01-May-1996           MRN: [id number]             PCP: Dorothyann Peng, NP Referring: Dorothyann Peng, NP Visit Date: 11/18/2021 Occupation: @GUAROCC @  Subjective:  No chief complaint on file.   History of Present Illness: Maureen Lewis is a 26 y.o. female ***   Activities of Daily Living:  Patient reports morning stiffness for *** {minute/hour:19697}.   Patient {ACTIONS;DENIES/REPORTS:21021675::"Denies"} nocturnal pain.  Difficulty dressing/grooming: {ACTIONS;DENIES/REPORTS:21021675::"Denies"} Difficulty climbing stairs: {ACTIONS;DENIES/REPORTS:21021675::"Denies"} Difficulty getting out of chair: {ACTIONS;DENIES/REPORTS:21021675::"Denies"} Difficulty using hands for taps, buttons, cutlery, and/or writing: {ACTIONS;DENIES/REPORTS:21021675::"Denies"}  No Rheumatology ROS completed.   PMFS History:  Patient Active Problem List   Diagnosis Date Noted   IUD check up 05/15/2021   Encounter for insertion of mirena IUD 04/01/2021   Levator spasm 11/21/2020   Galactorrhea 11/19/2020   Hx of migraine headaches 04/09/2020   Contraceptive management 11/22/2019   Positive RPR test 10/09/2019   HTN (hypertension) 09/23/2019   Migraine headache 07/12/2019   Alpha thalassemia trait    Vitamin D deficiency 09/16/2017   Prediabetes 09/16/2017   Depression 09/16/2017   Class 3 severe obesity with serious comorbidity and body mass index (BMI) of 50.0 to 59.9 in adult [facility]) 09/16/2017   PCOS (polycystic ovarian syndrome) 08/13/2017   Mass 05/06/2017   Vertigo 10/06/2016   Microcytic anemia 09/26/2016   Obesity hypoventilation syndrome ([place]) 08/28/2016   Benign paroxysmal positional vertigo due to bilateral vestibular disorder 08/28/2016   OSA (obstructive sleep apnea) 08/28/2016   Hypersomnia with sleep apnea 08/28/2016   Female hirsutism 08/28/2016   GERD (gastroesophageal reflux disease)  02/24/2015   Constipation 10/02/2014    Past Medical History:  Diagnosis Date   Abnormal uterine bleeding    Acid reflux    Alpha thalassemia trait    Amenorrhea    Anemia    no current med.   Asthma    Back pain    Cesarean delivery delivered 10/11/2019   10/09/2019 - primary CS for failed IOL   Chest pain    resolved   Complication of anesthesia    states had to keep giving her anesthesia during EGD   Constipation    Depression    "I'm good"   Fibromyalgia    Finger mass, left 03/2017   middle finger   Gestational diabetes 05/23/2019   hx - with pregnancy only    Gestational hypertension 09/23/2019   Guidelines for Antenatal Testing and Sonography  (with updated ICD-10 codes)  Updated  09-22-2019 with Dr. Tama High  INDICATION U/S 2 X week NST/AFI  or full BPP wkly DELIVERY Diabetes   A1 - good control - O24.410    A2 - good control - O24.419      A2  - poor control or poor compliance - O24.419, E11.65   (Macrosomia or polyhydramnios) **E11.65 is extra code for poor control**    A2/B - O24.   Headache    Hypertension    Infection    UTI   Irritable bowel syndrome (IBS)    Joint pain    Morbid obesity with body mass index (BMI) of 45.0 to 49.9 in adult [facility])    Obesity during pregnancy, antepartum 04/06/2019   Body mass index is 53.71 kg/m.  Recommendations [x]  Aspirin 81 mg daily after 12  weeks; discontinue after 36 weeks $Remove'[ ]'HttVFqm$  Nutrition consult $RemoveBeforeDEI'[ ]'VnooHMOhdCTifubX$  Weight gain 11-20 lbs for singleton and 25-35 lbs for twin pregnancy (IOM guidelines) Higher class of obesity patients recommended to gain closer to lower limit  Weight loss is associated with adverse outcomes $RemoveBeforeDE'[ ]'uWQtZEbXVaVUWLe$  Baseline and surveillance labs (pulled in fr   Plantar fasciitis, bilateral    resolved   Polycystic ovary syndrome    Pre-diabetes    no meds, diet controlled, diet not check blood sugar   Prediabetes    "prediabetes"   Pregnancy induced hypertension    Shortness of breath    albuterol inhaler   Sleep apnea     no CPAP use   Supervision of normal first pregnancy 04/06/2019   [name]: [ x]Initial [ x]12 $Remo'[ ]'Zuebn$ 20 $Remo'[ ]'tvFhx$ 28 $Remo'[ ]'sxuob$ 32 $Remo'[ ]'GhHTc$ 36 $Remo'[ ]'JKdfz$ 38 $Remo'[ ]'WosAb$ 39 $Remo'[ ]'FJPdL$ 40 Nursing Staff Provider Office Location [facility]  Dating   LMP Language  English  Anatomy US  Nml Flu Vaccine  Declined 07/28/19 Genetic Screen  NIPS: low risks   AFP:   negative  TDaP vaccine   08/25/19 Hgb A1C or  GTT Early A1C 5.8 Third trimester: GDM insulin Rhogam  NA   LAB RESULTS  Feeding Plan Breast Blood Type   Vertigo 2017    Family History  Problem Relation Age of Onset   Diabetes Maternal Aunt    Hypertension Maternal Uncle    Depression Maternal Grandfather    Diabetes Maternal Grandfather    Hypertension Maternal Grandfather    Arthritis Mother    High blood pressure Mother    Depression Mother    Sleep apnea Mother    Obesity Mother    Diabetes Mother    Hypertension Mother    Kidney failure Mother    Sleep apnea Father    Obesity Father    Healthy Daughter    Past Surgical History:  Procedure Laterality Date   CESAREAN SECTION N/A 10/09/2019   Procedure: CESAREAN SECTION;  Surgeon: Aletha Halim, MD;  [facility]: [facility];  Service: Obstetrics;  Laterality: N/A;   COLONOSCOPY WITH PROPOFOL  10/07/2016   DILATION AND CURETTAGE OF UTERUS     ESOPHAGOGASTRODUODENOSCOPY  12/21/2015   EXCISION MASS UPPER EXTREMETIES Left 04/21/2017   Procedure: EXCISION MASS LEFT MIDDLE FINGER;  Surgeon: Daryll Brod, MD;  Location: [place];  Service: Orthopedics;  Laterality: Left;   IR FL GUIDED LOC OF NEEDLE/CATH TIP FOR SPINAL INJECTION RT  03/13/2020   PHOTOCOAGULATION WITH LASER Left 12/13/2020   Procedure: RETINOPLEXY LEFT EYE;  Surgeon: Sherlynn Stalls, MD;  Location: [place];  Service: Ophthalmology;  Laterality: Left;   SCLERAL BUCKLE Right 12/13/2020   Procedure: SCLERAL BUCKLE WITH CRYO;  Surgeon: Sherlynn Stalls, MD;  Location: [place];  Service: Ophthalmology;  Laterality: Right;   Social History   Social History  Narrative   Right handed    Soda sometimes   Lives with mom and cousin, a grandmother in [place] also helps care for her.       She is in nursing school.    Immunization History  Administered Date(s) Administered   HPV Quadrivalent 05/20/2012, 07/22/2012, 08/23/2013   Influenza,inj,Quad PF,6+ Mos 08/23/2013, 07/18/2014, 08/25/2019, 09/07/2020   PFIZER(Purple Top)SARS-COV-2 Vaccination 07/24/2020, 08/17/2020   PPD Test 11/14/2020   Tdap 07/12/2009, 08/25/2019     Objective: Vital Signs: There were no vitals taken for this visit.   Physical Exam   Musculoskeletal Exam: ***  CDAI Exam: CDAI Score: -- Patient  Global: --; Provider Global: -- Swollen: --; Tender: -- Joint Exam 11/18/2021   No joint exam has been documented for this visit   There is currently no information documented on the homunculus. Go to the Rheumatology activity and complete the homunculus joint exam.  Investigation: No additional findings.  Imaging: No results found.  Recent Labs: Lab Results  Component Value Date   WBC 6.4 06/09/2021   HGB 13.1 06/09/2021   PLT 275 06/09/2021   NA 140 06/09/2021   K 4.7 06/09/2021   CL 105 06/09/2021   CO2 28 06/09/2021   GLUCOSE 109 (H) 06/09/2021   BUN 12 06/09/2021   CREATININE 0.67 06/09/2021   BILITOT 0.2 05/06/2021   ALKPHOS 118 05/06/2021   AST 12 05/06/2021   ALT 19 05/06/2021   PROT 6.9 05/06/2021   ALBUMIN 4.0 05/06/2021   CALCIUM 9.6 06/09/2021   GFRAA 141 06/26/2020    Speciality Comments: No specialty comments available.  Procedures:  No procedures performed Allergies: Bee pollen, Bee venom, Hydrocodone-acetaminophen, Peach flavor, Pollen extract, Remdesivir, Cortisone, and Cortizone-10 [hydrocortisone]   Assessment / Plan:     Visit Diagnoses: Positive ANA (antinuclear antibody)  Pain in both hands  Myalgia  Essential hypertension  Prediabetes  History of gastroesophageal reflux (GERD)  Hx of migraine  headaches  Pseudotumor cerebri syndrome  Benign paroxysmal positional vertigo due to bilateral vestibular disorder  Hypersomnia with sleep apnea  Obesity hypoventilation syndrome (HCC)  Acute hypoxemic respiratory failure due to COVID-19 (HCC)  Microcytic anemia  Vitamin D deficiency  Alpha thalassemia trait  PCOS (polycystic ovarian syndrome)  Female hirsutism  Orders: No orders of the defined types were placed in this encounter.  No orders of the defined types were placed in this encounter.   Face-to-face time spent with patient was *** minutes. Greater than 50% of time was spent in counseling and coordination of care.  Follow-Up Instructions: No follow-ups on file.   Ofilia Neas, PA-C  Note - This record has been created using Dragon software.  Chart creation errors have been sought, but may not always  have been located. Such creation errors do not reflect on  the standard of medical care.

## 2021-11-18 NOTE — Progress Notes (Deleted)
° °  GYNECOLOGY PROGRESS NOTE  History:  26 y.o. G1P1001 presents to [facility] *** office today for problem gyn visit. She reports *****.  She denies h/a, dizziness, shortness of breath, n/v, or fever/chills.    The following portions of the patient's history were reviewed and updated as appropriate: allergies, current medications, past family history, past medical history, past social history, past surgical history and problem list. Last pap smear on *** was normal, *** HRHPV.  Health Maintenance Due  Topic Date Due   URINE MICROALBUMIN  02/23/2016   COVID-19 Vaccine (3 - Booster for Pfizer series) 10/12/2020   INFLUENZA VACCINE  05/20/2021     Review of Systems:  Pertinent items are noted in HPI.   Objective:  Physical Exam There were no vitals taken for this visit. VS reviewed, nursing note reviewed,  Constitutional: well developed, well nourished, no distress HEENT: normocephalic CV: normal rate Pulm/chest wall: normal effort Breast Exam: deferred Abdomen: soft Neuro: alert and oriented x 3 Skin: warm, dry Psych: affect normal Pelvic exam: Cervix pink, visually closed, without lesion, scant white creamy discharge, vaginal walls and external genitalia normal Bimanual exam: Cervix 0/long/high, firm, anterior, neg CMT, uterus nontender, nonenlarged, adnexa without tenderness, enlargement, or mass  Assessment & Plan:  There are no diagnoses linked to this encounter.  Fatima Blank, CNM 8:44 AM

## 2021-11-19 ENCOUNTER — Ambulatory Visit: Payer: Medicaid Other | Admitting: Physician Assistant

## 2021-11-20 ENCOUNTER — Ambulatory Visit: Payer: Medicaid Other | Admitting: Adult Health

## 2021-11-20 NOTE — Progress Notes (Deleted)
Subjective:    Patient ID: Maureen Lewis, female    DOB: 1996-01-02, 26 y.o.   MRN: [id number]  HPI 26 year old female who  has a past medical history of Abnormal uterine bleeding, Acid reflux, Alpha thalassemia trait, Amenorrhea, Anemia, Asthma, Back pain, Cesarean delivery delivered (10/11/2019), Chest pain, Complication of anesthesia, Constipation, Depression, Fibromyalgia, Finger mass, left (03/2017), Gestational diabetes (05/23/2019), Gestational hypertension (09/23/2019), Headache, Hypertension, Infection, Irritable bowel syndrome (IBS), Joint pain, Morbid obesity with body mass index (BMI) of 45.0 to 49.9 in adult [facility]), Obesity during pregnancy, antepartum (04/06/2019), Plantar fasciitis, bilateral, Polycystic ovary syndrome, Pre-diabetes, Prediabetes, Pregnancy induced hypertension, Shortness of breath, Sleep apnea, Supervision of normal first pregnancy (04/06/2019), and Vertigo (2017).  She was seen by cardiology at [facility] on 11/08/2021   Review of Systems See HPI   Past Medical History:  Diagnosis Date   Abnormal uterine bleeding    Acid reflux    Alpha thalassemia trait    Amenorrhea    Anemia    no current med.   Asthma    Back pain    Cesarean delivery delivered 10/11/2019   10/09/2019 - primary CS for failed IOL   Chest pain    resolved   Complication of anesthesia    states had to keep giving her anesthesia during EGD   Constipation    Depression    "I'm good"   Fibromyalgia    Finger mass, left 03/2017   middle finger   Gestational diabetes 05/23/2019   hx - with pregnancy only    Gestational hypertension 09/23/2019   Guidelines for Antenatal Testing and Sonography  (with updated ICD-10 codes)  Updated  Sep 16, 2019 with Dr. Tama High  INDICATION U/S 2 X week NST/AFI  or full BPP wkly DELIVERY Diabetes   A1 - good control - O24.410    A2 - good control - O24.419      A2  - poor control or poor compliance - O24.419, E11.65   (Macrosomia or  polyhydramnios) **E11.65 is extra code for poor control**    A2/B - O24.   Headache    Hypertension    Infection    UTI   Irritable bowel syndrome (IBS)    Joint pain    Morbid obesity with body mass index (BMI) of 45.0 to 49.9 in adult [facility])    Obesity during pregnancy, antepartum 04/06/2019   Body mass index is 53.71 kg/m.  Recommendations [x]  Aspirin 81 mg daily after 12 weeks; discontinue after 36 weeks [ ]  Nutrition consult [ ]  Weight gain 11-20 lbs for singleton and 25-35 lbs for twin pregnancy (IOM guidelines) Higher class of obesity patients recommended to gain closer to lower limit  Weight loss is associated with adverse outcomes [ ]  Baseline and surveillance labs (pulled in fr   Plantar fasciitis, bilateral    resolved   Polycystic ovary syndrome    Pre-diabetes    no meds, diet controlled, diet not check blood sugar   Prediabetes    "prediabetes"   Pregnancy induced hypertension    Shortness of breath    albuterol inhaler   Sleep apnea    no CPAP use   Supervision of normal first pregnancy 04/06/2019   [name]: [ x]Initial [ x]12 [ ] 20 [ ] 28 [ ] 32 [ ] 36 [ ] 38 [ ] 39 [ ] 40 Nursing Staff Provider Office Location [facility]  Dating   LMP Language  English  Anatomy US  Nml Flu Vaccine  Declined 07/28/19  Genetic Screen  NIPS: low risks   AFP:   negative  TDaP vaccine   08/25/19 Hgb A1C or  GTT Early A1C 5.8 Third trimester: GDM insulin Rhogam  NA   LAB RESULTS  Feeding Plan Breast Blood Type   Vertigo 2017    Social History   Socioeconomic History   Marital status: Single    Spouse name: Not on file   Number of children: Not on file   Years of education: Not on file   Highest education level: Not on file  Occupational History   Occupation: Chemical engineer    Employer: [id number]  Tobacco Use   Smoking status: Never   Smokeless tobacco: Never  Vaping Use   Vaping Use: Never used  Substance and Sexual Activity   Alcohol use: Not Currently    Comment: social     Drug use: No   Sexual activity: Yes    Birth control/protection: Condom  Other Topics Concern   Not on file  Social History Narrative   Right handed    Soda sometimes   Lives with mom and cousin, a grandmother in [place] also helps care for her.       She is in nursing school.    Social Determinants of Health   Financial Resource Strain: Not on file  Food Insecurity: Not on file  Transportation Needs: Not on file  Physical Activity: Not on file  Stress: Not on file  Social Connections: Not on file  Intimate Partner Violence: Not on file    Past Surgical History:  Procedure Laterality Date   CESAREAN SECTION N/A 10/09/2019   Procedure: CESAREAN SECTION;  Surgeon: Aletha Halim, MD;  Location: [facility];  Service: Obstetrics;  Laterality: N/A;   COLONOSCOPY WITH PROPOFOL  10/07/2016   DILATION AND CURETTAGE OF UTERUS     ESOPHAGOGASTRODUODENOSCOPY  12/21/2015   EXCISION MASS UPPER EXTREMETIES Left 04/21/2017   Procedure: EXCISION MASS LEFT MIDDLE FINGER;  Surgeon: Daryll Brod, MD;  Location: [place];  Service: Orthopedics;  Laterality: Left;   IR FL GUIDED LOC OF NEEDLE/CATH TIP FOR SPINAL INJECTION RT  03/13/2020   PHOTOCOAGULATION WITH LASER Left 12/13/2020   Procedure: RETINOPLEXY LEFT EYE;  Surgeon: Sherlynn Stalls, MD;  Location: [place];  Service: Ophthalmology;  Laterality: Left;   SCLERAL BUCKLE Right 12/13/2020   Procedure: SCLERAL BUCKLE WITH CRYO;  Surgeon: Sherlynn Stalls, MD;  Location: [place];  Service: Ophthalmology;  Laterality: Right;    Family History  Problem Relation Age of Onset   Diabetes Maternal Aunt    Hypertension Maternal Uncle    Depression Maternal Grandfather    Diabetes Maternal Grandfather    Hypertension Maternal Grandfather    Arthritis Mother    High blood pressure Mother    Depression Mother    Sleep apnea Mother    Obesity Mother    Diabetes Mother    Hypertension Mother    Kidney failure Mother    Sleep apnea  Father    Obesity Father    Healthy Daughter     Allergies  Allergen Reactions   Bee Pollen Hives, Shortness Of Breath and Swelling   Bee Venom Hives, Shortness Of Breath and Swelling   Hydrocodone-Acetaminophen Anaphylaxis    No problem when she takes Tylenol   Peach Flavor Hives, Shortness Of Breath and Other (See Comments)    SWELLING OF MOUTH   Pollen Extract Hives, Shortness Of Breath and Swelling   Remdesivir Swelling  Angioedema 8/25   Cortisone Itching and Swelling   Cortizone-10 [Hydrocortisone]     Swelling and itching    Current Outpatient Medications on File Prior to Visit  Medication Sig Dispense Refill   acetaZOLAMIDE ER (DIAMOX) 500 MG capsule Take by mouth.     amLODipine (NORVASC) 10 MG tablet Take 1 tablet (10 mg total) by mouth daily. 90 tablet 3   benzonatate (TESSALON) 200 MG capsule Take by mouth.     Blood Pressure Monitoring (BLOOD PRESSURE MON/AUTO/WRIST) DEVI Use to monitor blood pressure 1 each 0   chlorhexidine (PERIDEX) 0.12 % solution SMARTSIG:By Mouth     diclofenac (VOLTAREN) 75 MG EC tablet Take 1 tablet (75 mg total) by mouth 2 (two) times daily. (Patient taking differently: Take 75 mg by mouth 2 (two) times daily as needed (pain).) 60 tablet 1   dicyclomine (BENTYL) 10 MG capsule dicyclomine 10 mg capsule     Diethylpropion HCl CR 75 MG TB24 Take by mouth.     Dulaglutide (TRULICITY) 7.25 DG/6.4QI SOPN Inject 0.75 mg into the skin every Wednesday.     ferrous sulfate 325 (65 FE) MG tablet Take 1 tablet (325 mg total) by mouth every other day. 45 tablet 1   fluticasone (FLONASE) 50 MCG/ACT nasal spray Place 1 spray into both nostrils daily. 16 g 2   fluticasone-salmeterol (ADVAIR HFA) 115-21 MCG/ACT inhaler Inhale 2 puffs into the lungs 2 (two) times daily. 3 each 3   gabapentin (NEURONTIN) 600 MG tablet Take 600 mg by mouth 3 (three) times daily.     hydrochlorothiazide (HYDRODIURIL) 25 MG tablet Take 1 tablet (25 mg total) by mouth daily. 90  tablet 3   ibuprofen (ADVIL) 600 MG tablet Take 1 tablet (600 mg total) by mouth every 6 (six) hours as needed. 30 tablet 0   levonorgestrel (KYLEENA) 19.5 MG IUD Kyleena 17.5 mcg/24 hrs (38yrs) 19.5mg  intrauterine device  Take 1 device by intrauterine route.     loratadine (CLARITIN) 10 MG tablet Take 10 mg by mouth daily as needed for allergies.      LORazepam (ATIVAN) 0.5 MG tablet 1-2 tabs 30 - 60 min prior to MRI. Do not drive with this medicine. 4 tablet 0   metroNIDAZOLE (FLAGYL) 500 MG tablet Take 1 tablet (500 mg total) by mouth 2 (two) times daily. 14 tablet 2   naproxen (NAPROSYN) 500 MG tablet Take 1 tablet (500 mg total) by mouth 2 (two) times daily with a meal. (Patient taking differently: Take 500 mg by mouth 2 (two) times daily as needed (pain).) 30 tablet 0   nortriptyline (PAMELOR) 25 MG capsule Take 1 capsule (25 mg total) by mouth at bedtime. 30 capsule 2   ondansetron (ZOFRAN-ODT) 8 MG disintegrating tablet ondansetron 8 mg disintegrating tablet     oxyCODONE-acetaminophen (PERCOCET/ROXICET) 5-325 MG tablet Take 1 tablet by mouth 5 (five) times daily.     pantoprazole (PROTONIX) 40 MG tablet Take 1 tablet (40 mg total) by mouth daily. 90 tablet 0   polyethylene glycol powder (GLYCOLAX/MIRALAX) 17 GM/SCOOP powder Take 17 g by mouth daily as needed for moderate constipation.      SUMAtriptan (IMITREX) 50 MG tablet Take 1 tablet (50 mg total) by mouth every 2 (two) hours as needed for migraine. May repeat in 2 hours if headache persists or recurs. 10 tablet 1   tiZANidine (ZANAFLEX) 4 MG tablet Take 4 mg by mouth 3 (three) times daily as needed.     topiramate (TOPAMAX) 50 MG  tablet Take by mouth.     Vitamin D, Ergocalciferol, (DRISDOL) 1.25 MG (50000 UNIT) CAPS capsule Take 1 capsule (50,000 Units total) by mouth every 7 (seven) days. (Patient taking differently: Take 50,000 Units by mouth every Monday.) 4 capsule 0   No current facility-administered medications on file prior to  visit.    There were no vitals taken for this visit.      Objective:   Physical Exam        Assessment & Plan:

## 2021-11-22 ENCOUNTER — Other Ambulatory Visit: Payer: Self-pay

## 2021-11-22 ENCOUNTER — Ambulatory Visit: Payer: Medicaid Other | Admitting: Obstetrics

## 2021-11-22 ENCOUNTER — Encounter: Payer: Self-pay | Admitting: Obstetrics

## 2021-11-22 ENCOUNTER — Other Ambulatory Visit (HOSPITAL_COMMUNITY)
Admission: RE | Admit: 2021-11-22 | Discharge: 2021-11-22 | Disposition: A | Discharge status: Home / Self Care | Payer: Medicaid Other | Source: Ambulatory Visit | Attending: Obstetrics | Admitting: Obstetrics

## 2021-11-22 VITALS — BP 146/92 | HR 80 | Ht 74.5 in | Wt >= 6400 oz

## 2021-11-22 DIAGNOSIS — N898 Other specified noninflammatory disorders of vagina: Secondary | ICD-10-CM | POA: Diagnosis not present

## 2021-11-22 DIAGNOSIS — N760 Acute vaginitis: Secondary | ICD-10-CM

## 2021-11-22 DIAGNOSIS — B9689 Other specified bacterial agents as the cause of diseases classified elsewhere: Secondary | ICD-10-CM

## 2021-11-22 DIAGNOSIS — Z6841 Body Mass Index (BMI) 40.0 and over, adult: Secondary | ICD-10-CM

## 2021-11-22 DIAGNOSIS — B379 Candidiasis, unspecified: Secondary | ICD-10-CM

## 2021-11-22 MED ORDER — FLUCONAZOLE 200 MG PO TABS
200.0000 mg | ORAL_TABLET | ORAL | 2 refills | Status: DC
Start: 2021-11-22 — End: 2022-05-23

## 2021-11-22 MED ORDER — METRONIDAZOLE 500 MG PO TABS: 500.0000 mg | ORAL_TABLET | Freq: Two times a day (BID) | ORAL | 2 refills | Status: DC

## 2021-11-22 NOTE — Progress Notes (Signed)
Patient ID: Maureen Lewis, female   DOB: [birth year], 26 y.o.   MRN: [id number]  Chief Complaint  Patient presents with   GYN    HPI Maureen Lewis is a 26 y.o. female.  Complains of vaginal discharge and irritation. HPI  Past Medical History:  Diagnosis Date   Abnormal uterine bleeding    Acid reflux    Alpha thalassemia trait    Amenorrhea    Anemia    no current med.   Asthma    Back pain    Cesarean delivery delivered 10/11/2019   10/09/2019 - primary CS for failed IOL   Chest pain    resolved   Complication of anesthesia    states had to keep giving her anesthesia during EGD   Constipation    Depression    "I'm good"   Fibromyalgia    Finger mass, left 03/2017   middle finger   Gestational diabetes 05/23/2019   hx - with pregnancy only    Gestational hypertension 09/23/2019   Guidelines for Antenatal Testing and Sonography  (with updated ICD-10 codes)  Updated  2019/09/28 with Dr. Tama High  INDICATION U/S 2 X week NST/AFI  or full BPP wkly DELIVERY Diabetes   A1 - good control - O24.410    A2 - good control - O24.419      A2  - poor control or poor compliance - O24.419, E11.65   (Macrosomia or polyhydramnios) **E11.65 is extra code for poor control**    A2/B - O24.   Headache    Hypertension    Infection    UTI   Irritable bowel syndrome (IBS)    Joint pain    Morbid obesity with body mass index (BMI) of 45.0 to 49.9 in adult [facility])    Obesity during pregnancy, antepartum 04/06/2019   Body mass index is 53.71 kg/m.  Recommendations [x]  Aspirin 81 mg daily after 12 weeks; discontinue after 36 weeks [ ]  Nutrition consult [ ]  Weight gain 11-20 lbs for singleton and 25-35 lbs for twin pregnancy (IOM guidelines) Higher class of obesity patients recommended to gain closer to lower limit  Weight loss is associated with adverse outcomes [ ]  Baseline and surveillance labs (pulled in fr   Plantar fasciitis, bilateral    resolved   Polycystic ovary  syndrome    Pre-diabetes    no meds, diet controlled, diet not check blood sugar   Prediabetes    "prediabetes"   Pregnancy induced hypertension    Shortness of breath    albuterol inhaler   Sleep apnea    no CPAP use   Supervision of normal first pregnancy 04/06/2019   [name]: [ x]Initial [ x]12 [ ] 20 [ ] 28 [ ] 32 [ ] 36 [ ] 38 [ ] 39 [ ] 40 Nursing Staff Provider Office Location [facility]  Dating   LMP Language  English  Anatomy US  Nml Flu Vaccine  Declined 07/28/19 Genetic Screen  NIPS: low risks   AFP:   negative  TDaP vaccine   08/25/19 Hgb A1C or  GTT Early A1C 5.8 Third trimester: GDM insulin Rhogam  NA   LAB RESULTS  Feeding Plan Breast Blood Type   Vertigo 2017    Past Surgical History:  Procedure Laterality Date   CESAREAN SECTION N/A 10/09/2019   Procedure: CESAREAN SECTION;  Surgeon: Aletha Halim, MD;  [facility]: [facility];  Service: Obstetrics;  Laterality: N/A;   COLONOSCOPY WITH PROPOFOL  10/07/2016   DILATION AND CURETTAGE  OF UTERUS     ESOPHAGOGASTRODUODENOSCOPY  12/21/2015   EXCISION MASS UPPER EXTREMETIES Left 04/21/2017   Procedure: EXCISION MASS LEFT MIDDLE FINGER;  Surgeon: Daryll Brod, MD;  Location: [place];  Service: Orthopedics;  Laterality: Left;   IR FL GUIDED LOC OF NEEDLE/CATH TIP FOR SPINAL INJECTION RT  03/13/2020   PHOTOCOAGULATION WITH LASER Left 12/13/2020   Procedure: RETINOPLEXY LEFT EYE;  Surgeon: Sherlynn Stalls, MD;  Location: [place];  Service: Ophthalmology;  Laterality: Left;   SCLERAL BUCKLE Right 12/13/2020   Procedure: SCLERAL BUCKLE WITH CRYO;  Surgeon: Sherlynn Stalls, MD;  Location: [place];  Service: Ophthalmology;  Laterality: Right;    Family History  Problem Relation Age of Onset   Diabetes Maternal Aunt    Hypertension Maternal Uncle    Depression Maternal Grandfather    Diabetes Maternal Grandfather    Hypertension Maternal Grandfather    Arthritis Mother    High blood pressure Mother    Depression Mother     Sleep apnea Mother    Obesity Mother    Diabetes Mother    Hypertension Mother    Kidney failure Mother    Sleep apnea Father    Obesity Father    Healthy Daughter     Social History Social History   Tobacco Use   Smoking status: Never   Smokeless tobacco: Never  Vaping Use   Vaping Use: Never used  Substance Use Topics   Alcohol use: Not Currently    Comment: social    Drug use: No    Allergies  Allergen Reactions   Bee Pollen Hives, Shortness Of Breath and Swelling   Bee Venom Hives, Shortness Of Breath and Swelling   Hydrocodone-Acetaminophen Anaphylaxis    No problem when she takes Tylenol   Peach Flavor Hives, Shortness Of Breath and Other (See Comments)    SWELLING OF MOUTH   Pollen Extract Hives, Shortness Of Breath and Swelling   Remdesivir Swelling    Angioedema 8/25   Cortisone Itching and Swelling   Cortizone-10 [Hydrocortisone]     Swelling and itching    Current Outpatient Medications  Medication Sig Dispense Refill   amLODipine (NORVASC) 10 MG tablet Take 1 tablet (10 mg total) by mouth daily. 90 tablet 3   fluconazole (DIFLUCAN) 200 MG tablet Take 1 tablet (200 mg total) by mouth every 3 (three) days. 3 tablet 2   gabapentin (NEURONTIN) 600 MG tablet Take 600 mg by mouth 3 (three) times daily.     ibuprofen (ADVIL) 600 MG tablet Take 1 tablet (600 mg total) by mouth every 6 (six) hours as needed. 30 tablet 0   levonorgestrel (KYLEENA) 19.5 MG IUD Kyleena 17.5 mcg/24 hrs (34yrs) 19.5mg  intrauterine device  Take 1 device by intrauterine route.     metroNIDAZOLE (FLAGYL) 500 MG tablet Take 1 tablet (500 mg total) by mouth 2 (two) times daily. 14 tablet 2   acetaZOLAMIDE ER (DIAMOX) 500 MG capsule Take by mouth.     benzonatate (TESSALON) 200 MG capsule Take by mouth.     Blood Pressure Monitoring (BLOOD PRESSURE MON/AUTO/WRIST) DEVI Use to monitor blood pressure 1 each 0   chlorhexidine (PERIDEX) 0.12 % solution SMARTSIG:By Mouth     diclofenac  (VOLTAREN) 75 MG EC tablet Take 1 tablet (75 mg total) by mouth 2 (two) times daily. (Patient taking differently: Take 75 mg by mouth 2 (two) times daily as needed (pain).) 60 tablet 1   dicyclomine (BENTYL) 10 MG capsule dicyclomine  10 mg capsule     Diethylpropion HCl CR 75 MG TB24 Take by mouth.     Dulaglutide (TRULICITY) 8.81 JS/3.1RX SOPN Inject 0.75 mg into the skin every Wednesday.     ferrous sulfate 325 (65 FE) MG tablet Take 1 tablet (325 mg total) by mouth every other day. 45 tablet 1   fluticasone (FLONASE) 50 MCG/ACT nasal spray Place 1 spray into both nostrils daily. 16 g 2   fluticasone-salmeterol (ADVAIR HFA) 115-21 MCG/ACT inhaler Inhale 2 puffs into the lungs 2 (two) times daily. 3 each 3   hydrochlorothiazide (HYDRODIURIL) 25 MG tablet Take 1 tablet (25 mg total) by mouth daily. (Patient not taking: Reported on 11/22/2021) 90 tablet 3   loratadine (CLARITIN) 10 MG tablet Take 10 mg by mouth daily as needed for allergies.      LORazepam (ATIVAN) 0.5 MG tablet 1-2 tabs 30 - 60 min prior to MRI. Do not drive with this medicine. 4 tablet 0   metroNIDAZOLE (FLAGYL) 500 MG tablet Take 1 tablet (500 mg total) by mouth 2 (two) times daily. 14 tablet 2   naproxen (NAPROSYN) 500 MG tablet Take 1 tablet (500 mg total) by mouth 2 (two) times daily with a meal. (Patient taking differently: Take 500 mg by mouth 2 (two) times daily as needed (pain).) 30 tablet 0   nortriptyline (PAMELOR) 25 MG capsule Take 1 capsule (25 mg total) by mouth at bedtime. 30 capsule 2   ondansetron (ZOFRAN-ODT) 8 MG disintegrating tablet ondansetron 8 mg disintegrating tablet     oxyCODONE-acetaminophen (PERCOCET/ROXICET) 5-325 MG tablet Take 1 tablet by mouth 5 (five) times daily.     pantoprazole (PROTONIX) 40 MG tablet Take 1 tablet (40 mg total) by mouth daily. 90 tablet 0   polyethylene glycol powder (GLYCOLAX/MIRALAX) 17 GM/SCOOP powder Take 17 g by mouth daily as needed for moderate constipation.       SUMAtriptan (IMITREX) 50 MG tablet Take 1 tablet (50 mg total) by mouth every 2 (two) hours as needed for migraine. May repeat in 2 hours if headache persists or recurs. 10 tablet 1   tiZANidine (ZANAFLEX) 4 MG tablet Take 4 mg by mouth 3 (three) times daily as needed.     topiramate (TOPAMAX) 50 MG tablet Take by mouth.     Vitamin D, Ergocalciferol, (DRISDOL) 1.25 MG (50000 UNIT) CAPS capsule Take 1 capsule (50,000 Units total) by mouth every 7 (seven) days. (Patient taking differently: Take 50,000 Units by mouth every Monday.) 4 capsule 0   No current facility-administered medications for this visit.    Review of Systems Review of Systems Constitutional: negative for fatigue and weight loss Respiratory: negative for cough and wheezing Cardiovascular: negative for chest pain, fatigue and palpitations Gastrointestinal: negative for abdominal pain and change in bowel habits Genitourinary: positive for vaginal discharge and irritation Integument/breast: negative for nipple discharge Musculoskeletal:negative for myalgias Neurological: negative for gait problems and tremors Behavioral/Psych: negative for abusive relationship, depression Endocrine: negative for temperature intolerance      Blood pressure (!) 146/92, pulse 80, height 6' 2.5" (1.892 m), weight (!) 490 lb 1.6 oz (222.3 kg).  Physical Exam Physical Exam General:   Alert and no distress  Skin:   no rash or abnormalities  Lungs:   clear to auscultation bilaterally  Heart:   regular rate and rhythm, S1, S2 normal, no murmur, click, rub or gallop  Breasts:   normal without suspicious masses, skin or nipple changes or axillary nodes  Abdomen:  normal  findings: no organomegaly, soft, non-tender and no hernia  Pelvis:  External genitalia: normal general appearance Urinary system: urethral meatus normal and bladder without fullness, nontender Vaginal: normal without tenderness, induration or masses Cervix: normal  appearance Adnexa: normal bimanual exam Uterus: anteverted and non-tender, normal size    I have spent a total of 20 minutes of face-to-face time, excluding clinical staff time, reviewing notes and preparing to see patient, ordering tests and/or medications, and counseling the patient.   Data Reviewed Wet Prep  Assessment     1. Vaginal discharge Rx: - Cervicovaginal ancillary only( [place])  2. BV (bacterial vaginosis) Rx: - metroNIDAZOLE (FLAGYL) 500 MG tablet; Take 1 tablet (500 mg total) by mouth 2 (two) times daily.  Dispense: 14 tablet; Refill: 2  3. Candida infection Rx: - fluconazole (DIFLUCAN) 200 MG tablet; Take 1 tablet (200 mg total) by mouth every 3 (three) days.  Dispense: 3 tablet; Refill: 2  4. Class 3 severe obesity without serious comorbidity with body mass index (BMI) of 60.0 to 69.9 in adult, unspecified obesity type ([place]) - weight reduction with the aid of dietary changes, exercise and behavioral modification recommended     Plan   Follow up in 6 months  No orders of the defined types were placed in this encounter.  Meds ordered this encounter  Medications   metroNIDAZOLE (FLAGYL) 500 MG tablet    Sig: Take 1 tablet (500 mg total) by mouth 2 (two) times daily.    Dispense:  14 tablet    Refill:  2   fluconazole (DIFLUCAN) 200 MG tablet    Sig: Take 1 tablet (200 mg total) by mouth every 3 (three) days.    Dispense:  3 tablet    Refill:  2     Shelly Bombard, MD 11/22/2021 10:08 AM

## 2021-11-22 NOTE — Progress Notes (Signed)
Pt is in the office reporting heavy cycles, vaginal and pelvic pain since IUD insertion 04-01-21

## 2021-11-25 ENCOUNTER — Other Ambulatory Visit: Payer: Self-pay | Admitting: Obstetrics

## 2021-11-25 LAB — CERVICOVAGINAL ANCILLARY ONLY
Bacterial Vaginitis (gardnerella): NEGATIVE
Candida Glabrata: NEGATIVE
Candida Vaginitis: POSITIVE — AB
Chlamydia: NEGATIVE
Comment: NEGATIVE
Comment: NEGATIVE
Comment: NEGATIVE
Comment: NEGATIVE
Comment: NEGATIVE
Comment: NORMAL
Neisseria Gonorrhea: NEGATIVE
Trichomonas: NEGATIVE

## 2021-11-25 NOTE — Therapy (Incomplete)
OUTPATIENT PHYSICAL THERAPY LOWER EXTREMITY EVALUATION   Patient Name: Maureen Lewis MRN: [id number] DOB:08-28-1996, 26 y.o., female Today's Date: 11/25/2021    Past Medical History:  Diagnosis Date   Abnormal uterine bleeding    Acid reflux    Alpha thalassemia trait    Amenorrhea    Anemia    no current med.   Asthma    Back pain    Cesarean delivery delivered 10/11/2019   10/09/2019 - primary CS for failed IOL   Chest pain    resolved   Complication of anesthesia    states had to keep giving her anesthesia during EGD   Constipation    Depression    "I'm good"   Fibromyalgia    Finger mass, left 03/2017   middle finger   Gestational diabetes 05/23/2019   hx - with pregnancy only    Gestational hypertension 09/23/2019   Guidelines for Antenatal Testing and Sonography  (with updated ICD-10 codes)  Updated  09/25/2019 with Dr. Tama High  INDICATION U/S 2 X week NST/AFI  or full BPP wkly DELIVERY Diabetes   A1 - good control - O24.410    A2 - good control - O24.419      A2  - poor control or poor compliance - O24.419, E11.65   (Macrosomia or polyhydramnios) **E11.65 is extra code for poor control**    A2/B - O24.   Headache    Hypertension    Infection    UTI   Irritable bowel syndrome (IBS)    Joint pain    Morbid obesity with body mass index (BMI) of 45.0 to 49.9 in adult [facility])    Obesity during pregnancy, antepartum 04/06/2019   Body mass index is 53.71 kg/m.  Recommendations [x]  Aspirin 81 mg daily after 12 weeks; discontinue after 36 weeks [ ]  Nutrition consult [ ]  Weight gain 11-20 lbs for singleton and 25-35 lbs for twin pregnancy (IOM guidelines) Higher class of obesity patients recommended to gain closer to lower limit  Weight loss is associated with adverse outcomes [ ]  Baseline and surveillance labs (pulled in fr   Plantar fasciitis, bilateral    resolved   Polycystic ovary syndrome    Pre-diabetes    no meds, diet controlled, diet not check  blood sugar   Prediabetes    "prediabetes"   Pregnancy induced hypertension    Shortness of breath    albuterol inhaler   Sleep apnea    no CPAP use   Supervision of normal first pregnancy 04/06/2019   [name]: [ x]Initial [ x]12 [ ] 20 [ ] 28 [ ] 32 [ ] 36 [ ] 38 [ ] 39 [ ] 40 Nursing Staff Provider Office Location [facility]  Dating   LMP Language  English  Anatomy US  Nml Flu Vaccine  Declined 07/28/19 Genetic Screen  NIPS: low risks   AFP:   negative  TDaP vaccine   08/25/19 Hgb A1C or  GTT Early A1C 5.8 Third trimester: GDM insulin Rhogam  NA   LAB RESULTS  Feeding Plan Breast Blood Type   Vertigo 2017   Past Surgical History:  Procedure Laterality Date   CESAREAN SECTION N/A 10/09/2019   Procedure: CESAREAN SECTION;  Surgeon: Aletha Halim, MD;  Location: [facility];  Service: Obstetrics;  Laterality: N/A;   COLONOSCOPY WITH PROPOFOL  10/07/2016   DILATION AND CURETTAGE OF UTERUS     ESOPHAGOGASTRODUODENOSCOPY  12/21/2015   EXCISION MASS UPPER EXTREMETIES Left 04/21/2017   Procedure: EXCISION MASS LEFT MIDDLE  FINGER;  Surgeon: Daryll Brod, MD;  Location: [place];  Service: Orthopedics;  Laterality: Left;   IR FL GUIDED LOC OF NEEDLE/CATH TIP FOR SPINAL INJECTION RT  03/13/2020   PHOTOCOAGULATION WITH LASER Left 12/13/2020   Procedure: RETINOPLEXY LEFT EYE;  Surgeon: Sherlynn Stalls, MD;  Location: [place];  Service: Ophthalmology;  Laterality: Left;   SCLERAL BUCKLE Right 12/13/2020   Procedure: SCLERAL BUCKLE WITH CRYO;  Surgeon: Sherlynn Stalls, MD;  Location: [place];  Service: Ophthalmology;  Laterality: Right;   Patient Active Problem List   Diagnosis Date Noted   IUD check up 05/15/2021   Encounter for insertion of mirena IUD 04/01/2021   Levator spasm 11/21/2020   Galactorrhea 11/19/2020   Hx of migraine headaches 04/09/2020   Contraceptive management 11/22/2019   Positive RPR test 10/09/2019   HTN (hypertension) 09/23/2019   Migraine headache 07/12/2019    Alpha thalassemia trait    Vitamin D deficiency 09/16/2017   Prediabetes 09/16/2017   Depression 09/16/2017   Class 3 severe obesity with serious comorbidity and body mass index (BMI) of 50.0 to 59.9 in adult [facility]) 09/16/2017   PCOS (polycystic ovarian syndrome) 08/13/2017   Mass 05/06/2017   Vertigo 10/06/2016   Microcytic anemia 09/26/2016   Obesity hypoventilation syndrome ([place]) 08/28/2016   Benign paroxysmal positional vertigo due to bilateral vestibular disorder 08/28/2016   OSA (obstructive sleep apnea) 08/28/2016   Hypersomnia with sleep apnea 08/28/2016   Female hirsutism 08/28/2016   GERD (gastroesophageal reflux disease) 02/24/2015   Constipation 10/02/2014    PCP: Dorothyann Peng, NP  REFERRING PROVIDER: Gregor Hams, MD  REFERRING DIAG: [phone] (ICD-10-CM) - Acute pain of left knee   THERAPY DIAG:  No diagnosis found.  ONSET DATE: 10/15/21  SUBJECTIVE:   SUBJECTIVE STATEMENT: ***  PERTINENT HISTORY: ***  PAIN:  Are you having pain? {yes/no:20286} NPRS scale: ***/10 Pain location: *** Pain orientation: {Pain Orientation:25161}  PAIN TYPE: {type:313116} Pain description: {PAIN DESCRIPTION:21022940}  Aggravating factors: *** Relieving factors: ***  PRECAUTIONS: Fall  WEIGHT BEARING RESTRICTIONS No  FALLS:  Has patient fallen in last 6 months? Yes, Number of falls: ***  LIVING ENVIRONMENT: Lives with: {[facility] lives with:25569::"lives with their family"} Lives in: {Lives in:25570} Stairs: {yes/no:20286}; {Stairs:24000} Has following equipment at home: {Assistive devices:23999}  OCCUPATION: ***  PLOF: {PLOF:24004}  PATIENT GOALS ***   OBJECTIVE:   DIAGNOSTIC FINDINGS:  Knee X-ray FINDINGS: No evidence of fracture, dislocation, or joint effusion. No evidence of arthropathy or other focal bone abnormality. Soft tissues are unremarkable.  PATIENT SURVEYS:  LEFS ***  COGNITION:  Overall cognitive status: Within functional limits for  tasks assessed        MUSCLE LENGTH: Hamstrings: Right *** deg; Left *** deg Thomas test: Right *** deg; Left *** deg  POSTURE:  ***  PALPATION: ***  LE AROM/PROM:  A/PROM Right 11/25/2021 Left 11/25/2021  Hip flexion    Hip extension    Hip abduction    Hip adduction    Hip internal rotation    Hip external rotation    Knee flexion    Knee extension    Ankle dorsiflexion    Ankle plantarflexion    Ankle inversion    Ankle eversion     (Blank rows = not tested)  LE MMT:  MMT Right 11/25/2021 Left 11/25/2021  Hip flexion    Hip extension    Hip abduction    Hip adduction    Hip internal rotation    Hip  external rotation    Knee flexion    Knee extension    Ankle dorsiflexion    Ankle plantarflexion    Ankle inversion    Ankle eversion     (Blank rows = not tested)  LOWER EXTREMITY SPECIAL TESTS:  {LEspecialtests:26242}  FUNCTIONAL TESTS:  {Functional tests:24029}  GAIT: Distance walked: *** Assistive device utilized: {Assistive devices:23999} Level of assistance: {Levels of assistance:24026} Comments: ***    TODAY'S TREATMENT: [facility] Adult PT Treatment:                                                DATE: 11/27/21 Therapeutic Exercise: *** Manual Therapy: *** Neuromuscular re-ed: *** Therapeutic Activity: *** Modalities: *** Self Care: ***    PATIENT EDUCATION:  Education details: See treatment above Person educated: Patient Education method: {Education Method:25205} Education comprehension: {Education Comprehension:25206}   HOME EXERCISE PROGRAM: ***  ASSESSMENT:  CLINICAL IMPRESSION: Patient is a 26 y.o. female well known to our clinic who was seen today for physical therapy evaluation and treatment for acute left knee pain . Objective impairments include {opptimpairments:25111}. These impairments are limiting patient from {activity limitations:25113}. Personal factors including {Personal factors:25162} are also affecting patient's  functional outcome. Patient will benefit from skilled PT to address above impairments and improve overall function.  REHAB POTENTIAL: Fair    CLINICAL DECISION MAKING: {clinical decision making:25114}  EVALUATION COMPLEXITY: {Evaluation complexity:25115}   GOALS: Goals reviewed with patient? No  SHORT TERM GOALS:  STG Name Target Date Goal status  1 *** Baseline:  {follow up:25551} {GOALSTATUS:25110}  2 *** Baseline:  {follow up:25551} {GOALSTATUS:25110}  3 *** Baseline: {follow up:25551} {GOALSTATUS:25110}   LONG TERM GOALS:   LTG Name Target Date Goal status  1 *** Baseline: {follow up:25551} {GOALSTATUS:25110}  2 *** Baseline: {follow up:25551} {GOALSTATUS:25110}  3 *** Baseline: {follow up:25551} {GOALSTATUS:25110}  4 *** Baseline: {follow up:25551} {GOALSTATUS:25110}   PLAN: PT FREQUENCY: {rehab frequency:25116}  PT DURATION: {rehab duration:25117}  PLANNED INTERVENTIONS: {rehab planned interventions:25118::"Therapeutic exercises","Therapeutic activity","Neuro Muscular re-education","Balance training","Gait training","Patient/Family education","Joint mobilization"}  PLAN FOR NEXT SESSION: ***  Gwendolyn Grant, PT, DPT, ATC 11/25/21 2:08 PM

## 2021-11-26 ENCOUNTER — Encounter: Payer: Self-pay | Admitting: *Deleted

## 2021-11-26 ENCOUNTER — Ambulatory Visit: Payer: Medicaid Other | Admitting: Physician Assistant

## 2021-11-27 ENCOUNTER — Ambulatory Visit: Payer: Medicaid Other | Attending: Family Medicine

## 2021-12-05 DIAGNOSIS — M542 Cervicalgia: Secondary | ICD-10-CM | POA: Diagnosis not present

## 2021-12-05 DIAGNOSIS — I10 Essential (primary) hypertension: Secondary | ICD-10-CM | POA: Diagnosis not present

## 2021-12-05 DIAGNOSIS — N915 Oligomenorrhea, unspecified: Secondary | ICD-10-CM | POA: Diagnosis not present

## 2021-12-05 DIAGNOSIS — M25571 Pain in right ankle and joints of right foot: Secondary | ICD-10-CM | POA: Diagnosis not present

## 2021-12-05 DIAGNOSIS — R7303 Prediabetes: Secondary | ICD-10-CM | POA: Diagnosis not present

## 2021-12-05 DIAGNOSIS — Z6841 Body Mass Index (BMI) 40.0 and over, adult: Secondary | ICD-10-CM | POA: Diagnosis not present

## 2021-12-05 DIAGNOSIS — M545 Low back pain, unspecified: Secondary | ICD-10-CM | POA: Diagnosis not present

## 2021-12-05 DIAGNOSIS — Z79899 Other long term (current) drug therapy: Secondary | ICD-10-CM | POA: Diagnosis not present

## 2021-12-05 DIAGNOSIS — M419 Scoliosis, unspecified: Secondary | ICD-10-CM | POA: Diagnosis not present

## 2021-12-05 DIAGNOSIS — R002 Palpitations: Secondary | ICD-10-CM | POA: Diagnosis not present

## 2021-12-06 ENCOUNTER — Encounter: Payer: Self-pay | Admitting: Obstetrics

## 2021-12-06 ENCOUNTER — Telehealth (INDEPENDENT_AMBULATORY_CARE_PROVIDER_SITE_OTHER): Payer: Medicaid Other | Admitting: Obstetrics

## 2021-12-06 ENCOUNTER — Ambulatory Visit: Payer: Medicaid Other | Admitting: Family Medicine

## 2021-12-06 DIAGNOSIS — B379 Candidiasis, unspecified: Secondary | ICD-10-CM | POA: Diagnosis not present

## 2021-12-06 DIAGNOSIS — N898 Other specified noninflammatory disorders of vagina: Secondary | ICD-10-CM | POA: Diagnosis not present

## 2021-12-06 DIAGNOSIS — B9689 Other specified bacterial agents as the cause of diseases classified elsewhere: Secondary | ICD-10-CM | POA: Diagnosis not present

## 2021-12-06 DIAGNOSIS — N760 Acute vaginitis: Secondary | ICD-10-CM | POA: Diagnosis not present

## 2021-12-06 NOTE — Progress Notes (Signed)
GYNECOLOGY VIRTUAL VISIT ENCOUNTER NOTE  Provider location: [facility] [place] [facility]   Patient location: Home  I connected with Maureen Lewis on 12/06/21 at  9:35 AM EST by MyChart Video Encounter and verified that I am speaking with the correct person using two identifiers.   I discussed the limitations, risks, security and privacy concerns of performing an evaluation and management service virtually and the availability of in person appointments. I also discussed with the patient that there may be a patient responsible charge related to this service. The patient expressed understanding and agreed to proceed.   History:  Maureen Lewis is a 26 y.o. G56P1001 female being evaluated today for vaginitis.  She is just finishing treatment for BV and yeast, and is still mildly symptomatic.   She denies any abnormal vaginal discharge, bleeding, pelvic pain or other concerns.       Past Medical History:  Diagnosis Date   Abnormal uterine bleeding    Acid reflux    Alpha thalassemia trait    Amenorrhea    Anemia    no current med.   Asthma    Back pain    Cesarean delivery delivered 10/11/2019   10/09/2019 - primary CS for failed IOL   Chest pain    resolved   Complication of anesthesia    states had to keep giving her anesthesia during EGD   Constipation    Depression    "I'm good"   Fibromyalgia    Finger mass, left 03/2017   middle finger   Gestational diabetes 05/23/2019   hx - with pregnancy only    Gestational hypertension 09/23/2019   Guidelines for Antenatal Testing and Sonography  (with updated ICD-10 codes)  Updated  September 29, 2019 with Dr. Tama High  INDICATION U/S 2 X week NST/AFI  or full BPP wkly DELIVERY Diabetes   A1 - good control - O24.410    A2 - good control - O24.419      A2  - poor control or poor compliance - O24.419, E11.65   (Macrosomia or polyhydramnios) **E11.65 is extra code for poor control**    A2/B - O24.    Headache    Hypertension    Infection    UTI   Irritable bowel syndrome (IBS)    Joint pain    Morbid obesity with body mass index (BMI) of 45.0 to 49.9 in adult [facility])    Obesity during pregnancy, antepartum 04/06/2019   Body mass index is 53.71 kg/m.  Recommendations [x]  Aspirin 81 mg daily after 12 weeks; discontinue after 36 weeks [ ]  Nutrition consult [ ]  Weight gain 11-20 lbs for singleton and 25-35 lbs for twin pregnancy (IOM guidelines) Higher class of obesity patients recommended to gain closer to lower limit  Weight loss is associated with adverse outcomes [ ]  Baseline and surveillance labs (pulled in fr   Plantar fasciitis, bilateral    resolved   Polycystic ovary syndrome    Pre-diabetes    no meds, diet controlled, diet not check blood sugar   Prediabetes    "prediabetes"   Pregnancy induced hypertension    Shortness of breath    albuterol inhaler   Sleep apnea    no CPAP use   Supervision of normal first pregnancy 04/06/2019   [name]: [ x]Initial [ x]12 [ ] 20 [ ] 28 [ ] 32 [ ] 36 [ ] 38 [ ] 39 [ ] 40 Nursing Staff Provider Office Location [facility]  Dating  LMP Language  English  Anatomy US  Nml Flu Vaccine  Declined 07/28/19 Genetic Screen  NIPS: low risks   AFP:   negative  TDaP vaccine   08/25/19 Hgb A1C or  GTT Early A1C 5.8 Third trimester: GDM insulin Rhogam  NA   LAB RESULTS  Feeding Plan Breast Blood Type   Vertigo 2017   Past Surgical History:  Procedure Laterality Date   CESAREAN SECTION N/A 10/09/2019   Procedure: CESAREAN SECTION;  Surgeon: Aletha Halim, MD;  Location: [place] LD ORS;  Service: Obstetrics;  Laterality: N/A;   COLONOSCOPY WITH PROPOFOL  10/07/2016   DILATION AND CURETTAGE OF UTERUS     ESOPHAGOGASTRODUODENOSCOPY  12/21/2015   EXCISION MASS UPPER EXTREMETIES Left 04/21/2017   Procedure: EXCISION MASS LEFT MIDDLE FINGER;  Surgeon: Daryll Brod, MD;  Location: [place];  Service: Orthopedics;  Laterality: Left;   IR FL  GUIDED LOC OF NEEDLE/CATH TIP FOR SPINAL INJECTION RT  03/13/2020   PHOTOCOAGULATION WITH LASER Left 12/13/2020   Procedure: RETINOPLEXY LEFT EYE;  Surgeon: Sherlynn Stalls, MD;  Location: [place];  Service: Ophthalmology;  Laterality: Left;   SCLERAL BUCKLE Right 12/13/2020   Procedure: SCLERAL BUCKLE WITH CRYO;  Surgeon: Sherlynn Stalls, MD;  Location: [place];  Service: Ophthalmology;  Laterality: Right;   The following portions of the patient's history were reviewed and updated as appropriate: allergies, current medications, past family history, past medical history, past social history, past surgical history and problem list.   Health Maintenance:  Normal pap and negative HRHPV on 04-11-2020.    Review of Systems:  Pertinent items noted in HPI and remainder of comprehensive ROS otherwise negative.  Physical Exam:   General:  Alert, oriented and cooperative. Patient appears to be in no acute distress.  Mental Status: Normal mood and affect. Normal behavior. Normal judgment and thought content.   Respiratory: Normal respiratory effort, no problems with respiration noted  Rest of physical exam deferred due to type of encounter  Labs and Imaging No results found for this or any previous visit (from the past 336 hour(s)). No results found.     Assessment and Plan:     1. Vaginal discharge - has BV and yeast.  Will follow clinically for a few weeks then reassess.  2. BV (bacterial vaginosis)  3. Candida infection        I discussed the assessment and treatment plan with the patient. The patient was provided an opportunity to ask questions and all were answered. The patient agreed with the plan and demonstrated an understanding of the instructions.   The patient was advised to call back or seek an in-person evaluation/go to the ED if the symptoms worsen or if the condition fails to improve as anticipated.  I have spent a total of 15 minutes of non-face-to-face time, excluding clinical  staff time, reviewing notes and preparing to see patient, ordering tests and/or medications, and counseling the patient.    Baltazar Najjar, MD [facility] [place], [facility] 12/06/21

## 2021-12-06 NOTE — Progress Notes (Signed)
I connected with  Maureen Lewis on 12/06/21 by telephone and verified that I am speaking with the correct person using two identifiers.

## 2021-12-09 DIAGNOSIS — Z79899 Other long term (current) drug therapy: Secondary | ICD-10-CM | POA: Diagnosis not present

## 2021-12-16 DIAGNOSIS — E669 Obesity, unspecified: Secondary | ICD-10-CM | POA: Diagnosis not present

## 2021-12-16 DIAGNOSIS — E1169 Type 2 diabetes mellitus with other specified complication: Secondary | ICD-10-CM | POA: Diagnosis not present

## 2021-12-16 DIAGNOSIS — Z6841 Body Mass Index (BMI) 40.0 and over, adult: Secondary | ICD-10-CM | POA: Diagnosis not present

## 2021-12-16 DIAGNOSIS — F439 Reaction to severe stress, unspecified: Secondary | ICD-10-CM | POA: Diagnosis not present

## 2021-12-16 DIAGNOSIS — E559 Vitamin D deficiency, unspecified: Secondary | ICD-10-CM | POA: Diagnosis not present

## 2021-12-16 DIAGNOSIS — I10 Essential (primary) hypertension: Secondary | ICD-10-CM | POA: Diagnosis not present

## 2021-12-20 ENCOUNTER — Encounter: Payer: Self-pay | Admitting: Adult Health

## 2021-12-20 ENCOUNTER — Ambulatory Visit: Payer: Medicaid Other | Admitting: Adult Health

## 2021-12-20 VITALS — BP 110/88 | HR 88 | Temp 98.0°F | Wt >= 6400 oz

## 2021-12-20 DIAGNOSIS — R768 Other specified abnormal immunological findings in serum: Secondary | ICD-10-CM | POA: Diagnosis not present

## 2021-12-20 DIAGNOSIS — R631 Polydipsia: Secondary | ICD-10-CM

## 2021-12-20 DIAGNOSIS — R3589 Other polyuria: Secondary | ICD-10-CM

## 2021-12-20 DIAGNOSIS — M2550 Pain in unspecified joint: Secondary | ICD-10-CM | POA: Diagnosis not present

## 2021-12-20 LAB — COMPREHENSIVE METABOLIC PANEL
ALT: 16 U/L (ref 0–35)
AST: 15 U/L (ref 0–37)
Albumin: 4 g/dL (ref 3.5–5.2)
Alkaline Phosphatase: 76 U/L (ref 39–117)
BUN: 11 mg/dL (ref 6–23)
CO2: 29 mEq/L (ref 19–32)
Calcium: 9.1 mg/dL (ref 8.4–10.5)
Chloride: 104 mEq/L (ref 96–112)
Creatinine, Ser: 0.63 mg/dL (ref 0.40–1.20)
GFR: 125.03 mL/min (ref >60.00)
Glucose, Bld: 100 mg/dL — ABNORMAL HIGH (ref 70–99)
Potassium: 4.2 mEq/L (ref 3.5–5.1)
Sodium: 140 mEq/L (ref 135–145)
Total Bilirubin: 0.5 mg/dL (ref 0.2–1.2)
Total Protein: 7.7 g/dL (ref 6.0–8.3)

## 2021-12-20 LAB — URINALYSIS
Bilirubin Urine: NEGATIVE
Hgb urine dipstick: NEGATIVE
Ketones, ur: NEGATIVE
Leukocytes,Ua: NEGATIVE
Nitrite: NEGATIVE
Specific Gravity, Urine: 1.02 (ref 1.000–1.030)
Total Protein, Urine: NEGATIVE
Urine Glucose: NEGATIVE
Urobilinogen, UA: 0.2 (ref 0.0–1.0)
pH: 6.5 (ref 5.0–8.0)

## 2021-12-20 LAB — TSH: TSH: 1.11 u[IU]/mL (ref 0.35–5.50)

## 2021-12-20 LAB — MICROALBUMIN / CREATININE URINE RATIO
Creatinine,U: 118.5 mg/dL
Microalb Creat Ratio: 0.6 mg/g (ref 0.0–30.0)
Microalb, Ur: <0.7 mg/dL (ref 0.0–1.9)

## 2021-12-20 NOTE — Progress Notes (Signed)
Subjective:    Patient ID: Maureen Lewis, female    DOB: Mar 25, 1996, 26 y.o.   MRN: [id number]  HPI 26 year old female who  has a past medical history of Abnormal uterine bleeding, Acid reflux, Alpha thalassemia trait, Amenorrhea, Anemia, Asthma, Back pain, Cesarean delivery delivered (10/11/2019), Chest pain, Complication of anesthesia, Constipation, Depression, Fibromyalgia, Finger mass, left (03/2017), Gestational diabetes (05/23/2019), Gestational hypertension (09/23/2019), Headache, Hypertension, Infection, Irritable bowel syndrome (IBS), Joint pain, Morbid obesity with body mass index (BMI) of 45.0 to 49.9 in adult [facility]), Obesity during pregnancy, antepartum (04/06/2019), Plantar fasciitis, bilateral, Polycystic ovary syndrome, Pre-diabetes, Prediabetes, Pregnancy induced hypertension, Shortness of breath, Sleep apnea, Supervision of normal first pregnancy (04/06/2019), and Vertigo (2017).  She presents to the office today for complaint of frequent urination and feeling very thirsty.  She states that "I feel like I cannot drink enough water" and then I have to urinate all day.  Symptoms have been present since at least January 2023.  She was seen by her [facility] who did an A1c on her last month with a reading of 5.5.  She is on Trulicity 4.5 mg.  She has not had any medication changes.  Denies dysuria or hematuria.  She would also like to be referred to another rheumatologist.  Currently being seen by Dr. Estanislado Pandy history of positive ANA, elevated CRP and sed rate as well as multiple muscle and joint pain.  She reports that she recently received a letter from her rheumatology office stating that they will no longer be seeing her in the office, she is unsure why because she has not missed any scheduled appointments.   Review of Systems See HPI   Past Medical History:  Diagnosis Date   Abnormal uterine bleeding    Acid reflux    Alpha thalassemia trait    Amenorrhea     Anemia    no current med.   Asthma    Back pain    Cesarean delivery delivered 10/11/2019   10/09/2019 - primary CS for failed IOL   Chest pain    resolved   Complication of anesthesia    states had to keep giving her anesthesia during EGD   Constipation    Depression    "I'm good"   Fibromyalgia    Finger mass, left 03/2017   middle finger   Gestational diabetes 05/23/2019   hx - with pregnancy only    Gestational hypertension 09/23/2019   Guidelines for Antenatal Testing and Sonography  (with updated ICD-10 codes)  Updated  09-14-19 with Dr. Tama High  INDICATION U/S 2 X week NST/AFI  or full BPP wkly DELIVERY Diabetes   A1 - good control - O24.410    A2 - good control - O24.419      A2  - poor control or poor compliance - O24.419, E11.65   (Macrosomia or polyhydramnios) **E11.65 is extra code for poor control**    A2/B - O24.   Headache    Hypertension    Infection    UTI   Irritable bowel syndrome (IBS)    Joint pain    Morbid obesity with body mass index (BMI) of 45.0 to 49.9 in adult [facility])    Obesity during pregnancy, antepartum 04/06/2019   Body mass index is 53.71 kg/m.  Recommendations [x]  Aspirin 81 mg daily after 12 weeks; discontinue after 36 weeks [ ]  Nutrition consult [ ]  Weight gain 11-20 lbs for singleton and 25-35 lbs for twin pregnancy (  IOM guidelines) Higher class of obesity patients recommended to gain closer to lower limit  Weight loss is associated with adverse outcomes [ ]  Baseline and surveillance labs (pulled in fr   Plantar fasciitis, bilateral    resolved   Polycystic ovary syndrome    Pre-diabetes    no meds, diet controlled, diet not check blood sugar   Prediabetes    "prediabetes"   Pregnancy induced hypertension    Shortness of breath    albuterol inhaler   Sleep apnea    no CPAP use   Supervision of normal first pregnancy 04/06/2019   [name]: [ x]Initial [ x]12 [ ] 20 [ ] 28 [ ] 32 [ ] 36 [ ] 38 [ ] 39 [ ] 40 Nursing  Staff Provider Office Location [facility]  Dating   LMP Language  English  Anatomy US  Nml Flu Vaccine  Declined 07/28/19 Genetic Screen  NIPS: low risks   AFP:   negative  TDaP vaccine   08/25/19 Hgb A1C or  GTT Early A1C 5.8 Third trimester: GDM insulin Rhogam  NA   LAB RESULTS  Feeding Plan Breast Blood Type   Vertigo 2017    Social History   Socioeconomic History   Marital status: Single    Spouse name: Not on file   Number of children: Not on file   Years of education: Not on file   Highest education level: Not on file  Occupational History   Occupation: Chemical engineer    Employer: [employer]  Tobacco Use   Smoking status: Never   Smokeless tobacco: Never  Vaping Use   Vaping Use: Never used  Substance and Sexual Activity   Alcohol use: Not Currently    Comment: social    Drug use: No   Sexual activity: Yes    Birth control/protection: I.U.D.  Other Topics Concern   Not on file  Social History Narrative   Right handed    Soda sometimes   Lives with mom and cousin, a grandmother in [place] also helps care for her.       She is in nursing school.    Social Determinants of Health   Financial Resource Strain: Not on file  Food Insecurity: Not on file  Transportation Needs: Not on file  Physical Activity: Not on file  Stress: Not on file  Social Connections: Not on file  Intimate Partner Violence: Not on file    Past Surgical History:  Procedure Laterality Date   CESAREAN SECTION N/A 10/09/2019   Procedure: CESAREAN SECTION;  Surgeon: Aletha Halim, MD;  Location: [facility] LD [facility]S;  Service: Obstetrics;  Laterality: N/A;   COLONOSCOPY WITH PROPOFOL  10/07/2016   DILATION AND CURETTAGE OF UTERUS     ESOPHAGOGASTRODUODENOSCOPY  12/21/2015   EXCISION MASS UPPER EXTREMETIES Left 04/21/2017   Procedure: EXCISION MASS LEFT MIDDLE FINGER;  Surgeon: Daryll Brod, MD;  Location: [place];  Service: Orthopedics;  Laterality: Left;   IR FL GUIDED LOC OF  NEEDLE/CATH TIP FOR SPINAL INJECTION RT  03/13/2020   PHOTOCOAGULATION WITH LASER Left 12/13/2020   Procedure: RETINOPLEXY LEFT EYE;  Surgeon: Sherlynn Stalls, MD;  Location: [place];  Service: Ophthalmology;  Laterality: Left;   SCLERAL BUCKLE Right 12/13/2020   Procedure: SCLERAL BUCKLE WITH CRYO;  Surgeon: Sherlynn Stalls, MD;  Location: [place];  Service: Ophthalmology;  Laterality: Right;    Family History  Problem Relation Age of Onset   Diabetes Maternal Aunt    Hypertension Maternal Uncle    Depression Maternal  Grandfather    Diabetes Maternal Grandfather    Hypertension Maternal Grandfather    Arthritis Mother    High blood pressure Mother    Depression Mother    Sleep apnea Mother    Obesity Mother    Diabetes Mother    Hypertension Mother    Kidney failure Mother    Sleep apnea Father    Obesity Father    Healthy Daughter     Allergies  Allergen Reactions   Bee Pollen Hives, Shortness Of Breath and Swelling   Bee Venom Hives, Shortness Of Breath and Swelling   Hydrocodone-Acetaminophen Anaphylaxis    No problem when she takes Tylenol   Peach Flavor Hives, Shortness Of Breath and Other (See Comments)    SWELLING OF MOUTH   Pollen Extract Hives, Shortness Of Breath and Swelling   Remdesivir Swelling    Angioedema 8/25   Cortisone Itching and Swelling   Cortizone-10 [Hydrocortisone]     Swelling and itching    Current Outpatient Medications on File Prior to Visit  Medication Sig Dispense Refill   acetaZOLAMIDE ER (DIAMOX) 500 MG capsule Take by mouth.     amLODipine (NORVASC) 10 MG tablet Take 1 tablet (10 mg total) by mouth daily. 90 tablet 3   benzonatate (TESSALON) 200 MG capsule Take by mouth.     Blood Pressure Monitoring (BLOOD PRESSURE MON/AUTO/WRIST) DEVI Use to monitor blood pressure 1 each 0   carvedilol (COREG) 3.125 MG tablet Take 3.125 mg by mouth 2 (two) times daily.     chlorhexidine (PERIDEX) 0.12 % solution SMARTSIG:By Mouth     diclofenac (VOLTAREN)  75 MG EC tablet Take 1 tablet (75 mg total) by mouth 2 (two) times daily. (Patient taking differently: Take 75 mg by mouth 2 (two) times daily as needed (pain).) 60 tablet 1   dicyclomine (BENTYL) 10 MG capsule dicyclomine 10 mg capsule     Diethylpropion HCl CR 75 MG TB24 Take by mouth.     fluconazole (DIFLUCAN) 200 MG tablet Take 1 tablet (200 mg total) by mouth every 3 (three) days. 3 tablet 2   fluticasone-salmeterol (ADVAIR HFA) 115-21 MCG/ACT inhaler Inhale 2 puffs into the lungs 2 (two) times daily. 3 each 3   gabapentin (NEURONTIN) 600 MG tablet Take 600 mg by mouth 3 (three) times daily.     hydrochlorothiazide (HYDRODIURIL) 25 MG tablet Take 1 tablet (25 mg total) by mouth daily. 90 tablet 3   ibuprofen (ADVIL) 600 MG tablet Take 1 tablet (600 mg total) by mouth every 6 (six) hours as needed. 30 tablet 0   levonorgestrel (KYLEENA) 19.5 MG IUD Kyleena 17.5 mcg/24 hrs (21yrs) 19.5mg  intrauterine device  Take 1 device by intrauterine route.     loratadine (CLARITIN) 10 MG tablet Take 10 mg by mouth daily as needed for allergies.      nortriptyline (PAMELOR) 25 MG capsule Take 1 capsule (25 mg total) by mouth at bedtime. 30 capsule 2   ondansetron (ZOFRAN-ODT) 8 MG disintegrating tablet ondansetron 8 mg disintegrating tablet     oxyCODONE-acetaminophen (PERCOCET/ROXICET) 5-325 MG tablet Take 1 tablet by mouth 5 (five) times daily.     pantoprazole (PROTONIX) 40 MG tablet Take 1 tablet (40 mg total) by mouth daily. 90 tablet 0   polyethylene glycol powder (GLYCOLAX/MIRALAX) 17 GM/SCOOP powder Take 17 g by mouth daily as needed for moderate constipation.      SUMAtriptan (IMITREX) 50 MG tablet Take 1 tablet (50 mg total) by mouth every 2 (two)  hours as needed for migraine. May repeat in 2 hours if headache persists or recurs. 10 tablet 1   tiZANidine (ZANAFLEX) 4 MG tablet Take 4 mg by mouth 3 (three) times daily as needed.     topiramate (TOPAMAX) 50 MG tablet Take by mouth.     Vitamin D,  Ergocalciferol, (DRISDOL) 1.25 MG (50000 UNIT) CAPS capsule Take 1 capsule (50,000 Units total) by mouth every 7 (seven) days. (Patient taking differently: Take 50,000 Units by mouth every Monday.) 4 capsule 0   ferrous sulfate 325 (65 FE) MG tablet Take 1 tablet (325 mg total) by mouth every other day. 45 tablet 1   No current facility-administered medications on file prior to visit.    BP 110/88    Pulse 88    Temp 98 F (36.7 C)    Wt (!) 486 lb (220.4 kg)    SpO2 94%    BMI 61.56 kg/m       Objective:   Physical Exam Vitals and nursing note reviewed.  Constitutional:      Appearance: Normal appearance. She is obese.  Cardiovascular:     Rate and Rhythm: Normal rate and regular rhythm.     Pulses: Normal pulses.     Heart sounds: Normal heart sounds.  Pulmonary:     Effort: Pulmonary effort is normal.     Breath sounds: Normal breath sounds.  Abdominal:     General: Abdomen is flat. Bowel sounds are normal.     Palpations: Abdomen is soft.     Tenderness: There is no right CVA tenderness or left CVA tenderness.  Musculoskeletal:        General: Normal range of motion.  Skin:    General: Skin is warm and dry.  Neurological:     General: No focal deficit present.     Mental Status: She is alert and oriented to person, place, and time.  Psychiatric:        Mood and Affect: Mood normal.        Behavior: Behavior normal.        Thought Content: Thought content normal.        Judgment: Judgment normal.      Assessment & Plan:  1. Polyuria -We will check labs today.  Consider 24-hour urine collection and referral to nephrology  - TSH; Future - Comprehensive metabolic panel; Future - Urinalysis; Future - Microalbumin/Creatinine Ratio, Urine; Future - Microalbumin/Creatinine Ratio, Urine - Urinalysis - Comprehensive metabolic panel - TSH  2. Polydipsia  - TSH; Future - Comprehensive metabolic panel; Future - Urinalysis; Future - Microalbumin/Creatinine Ratio,  Urine; Future - Microalbumin/Creatinine Ratio, Urine - Urinalysis - Comprehensive metabolic panel - TSH  3. Positive ANA (antinuclear antibody)  - Ambulatory referral to Rheumatology  4. Multiple joint pain  - Ambulatory referral to Rheumatology  Dorothyann Peng, NP

## 2021-12-20 NOTE — Patient Instructions (Signed)
Health Maintenance Due  ?Topic Date Due  ? URINE MICROALBUMIN  02/23/2016  ? COVID-19 Vaccine (3 - Booster for Pfizer series) 10/12/2020  ? INFLUENZA VACCINE  05/20/2021  ? ? ?Depression screen [facility] 2/9 09/24/2021 11/08/2020 09/28/2020  ?Decreased Interest 1 0 0  ?Down, Depressed, Hopeless 1 0 0  ?PHQ - 2 Score 2 0 0  ?Altered sleeping 3 - -  ?Tired, decreased energy 3 - -  ?Change in appetite 0 - -  ?Feeling bad or failure about yourself  1 - -  ?Trouble concentrating 3 - -  ?Moving slowly or fidgety/restless 0 - -  ?Suicidal thoughts 1 - -  ?PHQ-9 Score 13 - -  ?Some recent data might be hidden  ? ? ?

## 2021-12-23 DIAGNOSIS — Z713 Dietary counseling and surveillance: Secondary | ICD-10-CM | POA: Diagnosis not present

## 2021-12-24 DIAGNOSIS — N760 Acute vaginitis: Secondary | ICD-10-CM | POA: Diagnosis not present

## 2021-12-24 DIAGNOSIS — N920 Excessive and frequent menstruation with regular cycle: Secondary | ICD-10-CM | POA: Diagnosis not present

## 2021-12-24 DIAGNOSIS — R300 Dysuria: Secondary | ICD-10-CM | POA: Diagnosis not present

## 2021-12-24 DIAGNOSIS — Z30431 Encounter for routine checking of intrauterine contraceptive device: Secondary | ICD-10-CM | POA: Diagnosis not present

## 2021-12-24 DIAGNOSIS — Z30432 Encounter for removal of intrauterine contraceptive device: Secondary | ICD-10-CM | POA: Diagnosis not present

## 2021-12-24 DIAGNOSIS — R102 Pelvic and perineal pain: Secondary | ICD-10-CM | POA: Diagnosis not present

## 2021-12-24 DIAGNOSIS — R350 Frequency of micturition: Secondary | ICD-10-CM | POA: Diagnosis not present

## 2021-12-26 ENCOUNTER — Telehealth: Payer: Self-pay | Admitting: Adult Health

## 2021-12-26 NOTE — Telephone Encounter (Signed)
Patient called and stated that she received a call from the [place] but phone had disconnected and she was calling back. CMA was unavailable so I let her know that I would send a message back and she would receive a call when CMA was available. ? ? ? ? ? ?Please advise  ?

## 2021-12-26 NOTE — Telephone Encounter (Signed)
This has been taking care of.

## 2021-12-27 ENCOUNTER — Encounter: Payer: Self-pay | Admitting: Adult Health

## 2022-01-02 ENCOUNTER — Other Ambulatory Visit (HOSPITAL_COMMUNITY): Payer: Self-pay

## 2022-01-02 DIAGNOSIS — Z79899 Other long term (current) drug therapy: Secondary | ICD-10-CM | POA: Diagnosis not present

## 2022-01-02 DIAGNOSIS — M545 Low back pain, unspecified: Secondary | ICD-10-CM | POA: Diagnosis not present

## 2022-01-02 DIAGNOSIS — M419 Scoliosis, unspecified: Secondary | ICD-10-CM | POA: Diagnosis not present

## 2022-01-02 DIAGNOSIS — N915 Oligomenorrhea, unspecified: Secondary | ICD-10-CM | POA: Diagnosis not present

## 2022-01-02 DIAGNOSIS — M542 Cervicalgia: Secondary | ICD-10-CM | POA: Diagnosis not present

## 2022-01-02 DIAGNOSIS — M25571 Pain in right ankle and joints of right foot: Secondary | ICD-10-CM | POA: Diagnosis not present

## 2022-01-02 DIAGNOSIS — Z6841 Body Mass Index (BMI) 40.0 and over, adult: Secondary | ICD-10-CM | POA: Diagnosis not present

## 2022-01-02 MED ORDER — TRULICITY 4.5 MG/0.5ML ~~LOC~~ SOAJ
4.5000 mg | SUBCUTANEOUS | 1 refills | Status: DC
  Filled 2022-01-02: qty 2, 28d supply, fill #0

## 2022-01-06 DIAGNOSIS — Z79899 Other long term (current) drug therapy: Secondary | ICD-10-CM | POA: Diagnosis not present

## 2022-01-10 ENCOUNTER — Other Ambulatory Visit (HOSPITAL_COMMUNITY): Payer: Self-pay

## 2022-01-29 DIAGNOSIS — R1032 Left lower quadrant pain: Secondary | ICD-10-CM | POA: Diagnosis not present

## 2022-01-29 DIAGNOSIS — N898 Other specified noninflammatory disorders of vagina: Secondary | ICD-10-CM | POA: Diagnosis not present

## 2022-01-29 DIAGNOSIS — Z113 Encounter for screening for infections with a predominantly sexual mode of transmission: Secondary | ICD-10-CM | POA: Diagnosis not present

## 2022-01-29 DIAGNOSIS — R110 Nausea: Secondary | ICD-10-CM | POA: Diagnosis not present

## 2022-02-03 DIAGNOSIS — E669 Obesity, unspecified: Secondary | ICD-10-CM | POA: Diagnosis not present

## 2022-02-03 DIAGNOSIS — E1169 Type 2 diabetes mellitus with other specified complication: Secondary | ICD-10-CM | POA: Diagnosis not present

## 2022-02-03 DIAGNOSIS — Z6841 Body Mass Index (BMI) 40.0 and over, adult: Secondary | ICD-10-CM | POA: Diagnosis not present

## 2022-02-03 DIAGNOSIS — Z79899 Other long term (current) drug therapy: Secondary | ICD-10-CM | POA: Diagnosis not present

## 2022-02-03 DIAGNOSIS — M25551 Pain in right hip: Secondary | ICD-10-CM | POA: Diagnosis not present

## 2022-02-03 DIAGNOSIS — I10 Essential (primary) hypertension: Secondary | ICD-10-CM | POA: Diagnosis not present

## 2022-02-03 DIAGNOSIS — M25571 Pain in right ankle and joints of right foot: Secondary | ICD-10-CM | POA: Diagnosis not present

## 2022-02-03 DIAGNOSIS — M419 Scoliosis, unspecified: Secondary | ICD-10-CM | POA: Diagnosis not present

## 2022-02-03 DIAGNOSIS — M545 Low back pain, unspecified: Secondary | ICD-10-CM | POA: Diagnosis not present

## 2022-02-03 DIAGNOSIS — N915 Oligomenorrhea, unspecified: Secondary | ICD-10-CM | POA: Diagnosis not present

## 2022-02-06 DIAGNOSIS — Z79899 Other long term (current) drug therapy: Secondary | ICD-10-CM | POA: Diagnosis not present

## 2022-02-14 DIAGNOSIS — K219 Gastro-esophageal reflux disease without esophagitis: Secondary | ICD-10-CM | POA: Diagnosis not present

## 2022-02-14 DIAGNOSIS — Z6841 Body Mass Index (BMI) 40.0 and over, adult: Secondary | ICD-10-CM | POA: Diagnosis not present

## 2022-02-14 DIAGNOSIS — E282 Polycystic ovarian syndrome: Secondary | ICD-10-CM | POA: Diagnosis not present

## 2022-02-14 DIAGNOSIS — G932 Benign intracranial hypertension: Secondary | ICD-10-CM | POA: Diagnosis not present

## 2022-02-14 DIAGNOSIS — E1169 Type 2 diabetes mellitus with other specified complication: Secondary | ICD-10-CM | POA: Diagnosis not present

## 2022-02-14 DIAGNOSIS — I10 Essential (primary) hypertension: Secondary | ICD-10-CM | POA: Diagnosis not present

## 2022-02-14 DIAGNOSIS — G4733 Obstructive sleep apnea (adult) (pediatric): Secondary | ICD-10-CM | POA: Diagnosis not present

## 2022-02-14 DIAGNOSIS — E669 Obesity, unspecified: Secondary | ICD-10-CM | POA: Diagnosis not present

## 2022-02-18 DIAGNOSIS — Z113 Encounter for screening for infections with a predominantly sexual mode of transmission: Secondary | ICD-10-CM | POA: Diagnosis not present

## 2022-02-18 DIAGNOSIS — R350 Frequency of micturition: Secondary | ICD-10-CM | POA: Diagnosis not present

## 2022-02-18 DIAGNOSIS — N920 Excessive and frequent menstruation with regular cycle: Secondary | ICD-10-CM | POA: Diagnosis not present

## 2022-02-18 DIAGNOSIS — R300 Dysuria: Secondary | ICD-10-CM | POA: Diagnosis not present

## 2022-02-20 ENCOUNTER — Telehealth: Payer: Self-pay | Admitting: Family Medicine

## 2022-02-20 NOTE — Telephone Encounter (Signed)
Pt has started a new job and needs a note stating that she needs to work seated due to her ankles. ? ?OK to send via [place]. ?

## 2022-02-21 ENCOUNTER — Encounter: Payer: Self-pay | Admitting: Family Medicine

## 2022-02-21 NOTE — Telephone Encounter (Signed)
Letter written.  Please let me know if I need to change anything. ?

## 2022-02-24 DIAGNOSIS — E282 Polycystic ovarian syndrome: Secondary | ICD-10-CM | POA: Diagnosis not present

## 2022-02-24 DIAGNOSIS — Z6841 Body Mass Index (BMI) 40.0 and over, adult: Secondary | ICD-10-CM | POA: Diagnosis not present

## 2022-02-24 DIAGNOSIS — I10 Essential (primary) hypertension: Secondary | ICD-10-CM | POA: Diagnosis not present

## 2022-02-24 DIAGNOSIS — E559 Vitamin D deficiency, unspecified: Secondary | ICD-10-CM | POA: Diagnosis not present

## 2022-02-24 DIAGNOSIS — Z794 Long term (current) use of insulin: Secondary | ICD-10-CM | POA: Diagnosis not present

## 2022-02-24 DIAGNOSIS — E119 Type 2 diabetes mellitus without complications: Secondary | ICD-10-CM | POA: Diagnosis not present

## 2022-02-26 ENCOUNTER — Encounter: Payer: Self-pay | Admitting: Family Medicine

## 2022-02-26 ENCOUNTER — Ambulatory Visit (INDEPENDENT_AMBULATORY_CARE_PROVIDER_SITE_OTHER): Payer: Medicaid Other

## 2022-02-26 ENCOUNTER — Ambulatory Visit (INDEPENDENT_AMBULATORY_CARE_PROVIDER_SITE_OTHER): Payer: Medicaid Other | Admitting: Family Medicine

## 2022-02-26 VITALS — BP 130/88 | HR 102 | Ht 74.5 in | Wt >= 6400 oz

## 2022-02-26 DIAGNOSIS — M25561 Pain in right knee: Secondary | ICD-10-CM | POA: Diagnosis not present

## 2022-02-26 DIAGNOSIS — M25571 Pain in right ankle and joints of right foot: Secondary | ICD-10-CM

## 2022-02-26 DIAGNOSIS — M7989 Other specified soft tissue disorders: Secondary | ICD-10-CM | POA: Diagnosis not present

## 2022-02-26 DIAGNOSIS — M25551 Pain in right hip: Secondary | ICD-10-CM

## 2022-02-26 DIAGNOSIS — G8929 Other chronic pain: Secondary | ICD-10-CM | POA: Diagnosis not present

## 2022-02-26 NOTE — Patient Instructions (Addendum)
Good to see you today. ? ?Referral has been placed for PT at [place] @ [facility] Call to schedule at [phone].  ? ?Please get an Xray today before you leave. ? ?Follow-up: 6 weeks ?

## 2022-02-26 NOTE — Progress Notes (Signed)
? ?I, Wendy Poet, LAT, ATC, am serving as scribe for Dr. [name]. ? ?Maureen Lewis is a 26 y.o. female who presents to [place] at [facility] today for R ankle and knee pain.  She was last seen by Dr. Georgina Snell on 11/05/21 for f/u of concussion and L knee and ankle pain after suffering a fall down 5 steps on 10/15/21.  She was last seen for her R ankle on 04/17/21 which is chronic in nature.  Today, pt reports R ankle and knee pain that increased about one month ago when her R hip popped and she fell against a wall while at work and rolled her R ankle.  She locates her pain to her R ant knee, R lateral hip and R lateral ankle. ? ?Swelling: yes in her R knee and ankle ?Knee mechanical symptoms: yes ?Aggravating factors: walking/weight-bearing activity;  ?Treatments tried: ice; Oxydone per pain management; muscle relaxers ? ?Diagnostic testing: R ankle XR- 03/29/21;  ? ?Pertinent review of systems: No fevers or chills ? ?Relevant historical information: Morbid obesity.  BMI 64 for intolerance to the systemic steroids causing allergic reaction in the past. ? ? ?Exam:  ?BP 130/88 (BP Location: Right Arm, Patient Position: Sitting, Cuff Size: Large)   Pulse (!) 102   Ht 6' 2.5" (1.892 m)   Wt (!) 512 lb 3.2 oz (232.3 kg)   SpO2 94%   BMI 64.88 kg/m?  ?General: Well Developed, well nourished, and in no acute distress.  ? ?MSK:  ?Right hip: Normal-appearing ?Tender palpation at greater trochanter. ?Normal hip motion. ?Hip abduction strength 4/5. ? ?Right knee: Normal. ?Normal motion. ?Tender palpation anterior knee. ?Stable ligamentous exam. ?Intact strength. ? ?Right ankle: ?Normal appearing ?Mildly tender palpation just posterior to lateral malleolus. ?Normal ankle motion. ? ? ? ?Lab and Radiology Results ? ?X-ray images right knee and right ankle obtained today personally and independently interpreted. ? ?Right knee: Mild medial compartment DJD.  No acute fractures are  present. ? ?Right ankle: No acute fractures are present.  Mild ankle DJD. ? ?Await for radiology review ? ? ? ? ?Assessment and Plan: ?26 y.o. female with right lateral hip pain, anterior knee pain, right lateral ankle pain. ? ?Right lateral hip pain: Thought to be due to trochanteric bursitis/hip abductor tendinopathy.  She is a good candidate for physical therapy.  Unfortunately cannot easily use steroid injection due to her prior history of allergic reaction with a steroid shot in the past. ? ?Right knee pain: Thought to be exacerbation of DJD.  Again PT is a good first treatment option. ? ?Right ankle pain: Again this is an exacerbation of a chronic underlying issue.  She previously had a posterior malleolus fracture.  Again physical therapy. ? ?Recheck in 6 weeks. ? ? ?PDMP not reviewed this encounter. ?Orders Placed This Encounter  ?Procedures  ? DG Ankle Complete Right  ?  Standing Status:   Future  ?  Number of Occurrences:   1  ?  Standing Expiration Date:   03/29/2022  ?  Order Specific Question:   Reason for Exam (SYMPTOM  OR DIAGNOSIS REQUIRED)  ?  Answer:   R ankle pain  ?  Order Specific Question:   Is patient pregnant?  ?  Answer:   No  ?  Order Specific Question:   Preferred imaging location?  ?  Answer:   [facility]  ? DG HIP UNILAT W OR W/O PELVIS 2-3 VIEWS RIGHT  ?  Standing Status:   Future  ?  Number of Occurrences:   1  ?  Standing Expiration Date:   03/29/2022  ?  Order Specific Question:   Reason for Exam (SYMPTOM  OR DIAGNOSIS REQUIRED)  ?  Answer:   R hip pain  ?  Order Specific Question:   Is patient pregnant?  ?  Answer:   No  ?  Order Specific Question:   Preferred imaging location?  ?  Answer:   [facility]  ? DG Knee AP/LAT W/[place] Right  ?  Standing Status:   Future  ?  Number of Occurrences:   1  ?  Standing Expiration Date:   03/29/2022  ?  Order Specific Question:   Reason for Exam (SYMPTOM  OR DIAGNOSIS REQUIRED)  ?  Answer:   R knee pain  ?  Order Specific  Question:   Is patient pregnant?  ?  Answer:   No  ?  Order Specific Question:   Preferred imaging location?  ?  Answer:   [facility]  ? Ambulatory referral to Physical Therapy  ?  Referral Priority:   Routine  ?  Referral Type:   Physical Medicine  ?  Referral Reason:   Specialty Services Required  ?  Requested Specialty:   Physical Therapy  ?  Number of Visits Requested:   1  ? ?No orders of the defined types were placed in this encounter. ? ? ? ?Discussed warning signs or symptoms. Please see discharge instructions. Patient expresses understanding. ? ? ?The above documentation has been reviewed and is accurate and complete [name], M.D. ? ? ?

## 2022-02-28 NOTE — Progress Notes (Signed)
Ankle x-ray shows no fractures.

## 2022-02-28 NOTE — Progress Notes (Signed)
Right hip x-ray shows no fractures.

## 2022-02-28 NOTE — Progress Notes (Signed)
Right knee x-ray looks normal to radiology

## 2022-03-05 DIAGNOSIS — Z6841 Body Mass Index (BMI) 40.0 and over, adult: Secondary | ICD-10-CM | POA: Diagnosis not present

## 2022-03-05 DIAGNOSIS — M545 Low back pain, unspecified: Secondary | ICD-10-CM | POA: Diagnosis not present

## 2022-03-05 DIAGNOSIS — N915 Oligomenorrhea, unspecified: Secondary | ICD-10-CM | POA: Diagnosis not present

## 2022-03-05 DIAGNOSIS — M419 Scoliosis, unspecified: Secondary | ICD-10-CM | POA: Diagnosis not present

## 2022-03-05 DIAGNOSIS — Z79899 Other long term (current) drug therapy: Secondary | ICD-10-CM | POA: Diagnosis not present

## 2022-03-05 DIAGNOSIS — M25551 Pain in right hip: Secondary | ICD-10-CM | POA: Diagnosis not present

## 2022-03-05 DIAGNOSIS — M25571 Pain in right ankle and joints of right foot: Secondary | ICD-10-CM | POA: Diagnosis not present

## 2022-03-07 DIAGNOSIS — Z79899 Other long term (current) drug therapy: Secondary | ICD-10-CM | POA: Diagnosis not present

## 2022-03-09 ENCOUNTER — Other Ambulatory Visit: Payer: Self-pay

## 2022-03-09 ENCOUNTER — Encounter (HOSPITAL_COMMUNITY): Payer: Self-pay | Admitting: *Deleted

## 2022-03-09 ENCOUNTER — Ambulatory Visit (HOSPITAL_COMMUNITY)
Admission: EM | Admit: 2022-03-09 | Discharge: 2022-03-09 | Disposition: A | Discharge status: Home / Self Care | Payer: Medicaid Other | Attending: Family Medicine | Admitting: Family Medicine

## 2022-03-09 ENCOUNTER — Ambulatory Visit (INDEPENDENT_AMBULATORY_CARE_PROVIDER_SITE_OTHER): Payer: Medicaid Other

## 2022-03-09 DIAGNOSIS — M7989 Other specified soft tissue disorders: Secondary | ICD-10-CM | POA: Diagnosis not present

## 2022-03-09 DIAGNOSIS — M25572 Pain in left ankle and joints of left foot: Secondary | ICD-10-CM

## 2022-03-09 MED ORDER — KETOROLAC TROMETHAMINE 30 MG/ML IJ SOLN
INTRAMUSCULAR | Status: AC
  Filled 2022-03-09: qty 1

## 2022-03-09 MED ORDER — KETOROLAC TROMETHAMINE 60 MG/2ML IM SOLN
30.0000 mg | Freq: Once | INTRAMUSCULAR | Status: AC
  Administered 2022-03-09: 30 mg via INTRAMUSCULAR

## 2022-03-09 NOTE — ED Provider Notes (Signed)
Maureen Lewis    CSN: [id number] Arrival date & time: 03/09/22  1725     History   Chief Complaint Chief Complaint  Patient presents with   Ankle Pain    HPI Maureen Lewis is a 26 y.o. female.  Presents today after walking down stairs and rolling her left ankle.  Patient has not been able to bear weight on the left leg since the incident.  States she has pain with motion of the left foot.  Swelling to ankle.  History of ankle sprains in the past. She denies head injury, loss of consciousness, headache, shortness of breath. Extensive medical history, she takes daily Percocet.  Last dose was this morning.  Past Medical History:  Diagnosis Date   Abnormal uterine bleeding    Acid reflux    Alpha thalassemia trait    Amenorrhea    Anemia    no current med.   Asthma    Back pain    Cesarean delivery delivered 10/11/2019   10/09/2019 - primary CS for failed IOL   Chest pain    resolved   Complication of anesthesia    states had to keep giving her anesthesia during EGD   Constipation    Depression    "I'm good"   Fibromyalgia    Finger mass, left 03/2017   middle finger   Gestational diabetes 05/23/2019   hx - with pregnancy only    Gestational hypertension 09/23/2019   Guidelines for Antenatal Testing and Sonography  (with updated ICD-10 codes)  Updated  2019/09/29 with Dr. Tama High  INDICATION U/S 2 X week NST/AFI  or full BPP wkly DELIVERY Diabetes   A1 - good control - O24.410    A2 - good control - O24.419      A2  - poor control or poor compliance - O24.419, E11.65   (Macrosomia or polyhydramnios) **E11.65 is extra code for poor control**    A2/B - O24.   Headache    Hypertension    Infection    UTI   Irritable bowel syndrome (IBS)    Joint pain    Morbid obesity with body mass index (BMI) of 45.0 to 49.9 in adult [facility])    Obesity during pregnancy, antepartum 04/06/2019   Body mass index is 53.71 kg/m.  Recommendations '[x]'$  Aspirin 81 mg  daily after 12 weeks; discontinue after 36 weeks '[ ]'$  Nutrition consult '[ ]'$  Weight gain 11-20 lbs for singleton and 25-35 lbs for twin pregnancy (IOM guidelines) Higher class of obesity patients recommended to gain closer to lower limit  Weight loss is associated with adverse outcomes '[ ]'$  Baseline and surveillance labs (pulled in fr   Plantar fasciitis, bilateral    resolved   Polycystic ovary syndrome    Pre-diabetes    no meds, diet controlled, diet not check blood sugar   Prediabetes    "prediabetes"   Pregnancy induced hypertension    Shortness of breath    albuterol inhaler   Sleep apnea    no CPAP use   Supervision of normal first pregnancy 04/06/2019   [name]: [ x]Initial [ x]12 '[ ]'$ 20 '[ ]'$ 28 '[ ]'$ 32 '[ ]'$ 36 '[ ]'$ 38 '[ ]'$ 39 '[ ]'$ 40 Nursing Staff Provider Office Location [facility]  Dating   LMP Language  English  Anatomy US  Nml Flu Vaccine  Declined 07/28/19 Genetic Screen  NIPS: low risks   AFP:   negative  TDaP vaccine   08/25/19 Hgb A1C or  GTT Early A1C 5.8 Third trimester: GDM insulin Rhogam  NA   LAB RESULTS  Feeding Plan Breast Blood Type   Vertigo 2017    Patient Active Problem List   Diagnosis Date Noted   IUD check up 05/15/2021   Encounter for insertion of mirena IUD 04/01/2021   Levator spasm 11/21/2020   Galactorrhea 11/19/2020   Hx of migraine headaches 04/09/2020   Contraceptive management 11/22/2019   Positive RPR test 10/09/2019   HTN (hypertension) 09/23/2019   Migraine headache 07/12/2019   Alpha thalassemia trait    Vitamin D deficiency 09/16/2017   Prediabetes 09/16/2017   Depression 09/16/2017   Class 3 severe obesity with serious comorbidity and body mass index (BMI) of 50.0 to 59.9 in adult [facility]) 09/16/2017   PCOS (polycystic ovarian syndrome) 08/13/2017   Mass 05/06/2017   Vertigo 10/06/2016   Microcytic anemia 09/26/2016   Obesity hypoventilation syndrome ([place]) 08/28/2016   Benign paroxysmal positional vertigo due to bilateral vestibular disorder  08/28/2016   OSA (obstructive sleep apnea) 08/28/2016   Hypersomnia with sleep apnea 08/28/2016   Female hirsutism 08/28/2016   GERD (gastroesophageal reflux disease) 02/24/2015   Constipation 10/02/2014    Past Surgical History:  Procedure Laterality Date   CESAREAN SECTION N/A 10/09/2019   Procedure: CESAREAN SECTION;  Surgeon: Aletha Halim, MD;  Location: [facility] LD ORS;  Service: Obstetrics;  Laterality: N/A;   COLONOSCOPY WITH PROPOFOL  10/07/2016   DILATION AND CURETTAGE OF UTERUS     ESOPHAGOGASTRODUODENOSCOPY  12/21/2015   EXCISION MASS UPPER EXTREMETIES Left 04/21/2017   Procedure: EXCISION MASS LEFT MIDDLE FINGER;  Surgeon: Daryll Brod, MD;  Location: [place];  Service: Orthopedics;  Laterality: Left;   IR FL GUIDED LOC OF NEEDLE/CATH TIP FOR SPINAL INJECTION RT  03/13/2020   PHOTOCOAGULATION WITH LASER Left 12/13/2020   Procedure: RETINOPLEXY LEFT EYE;  Surgeon: Sherlynn Stalls, MD;  Location: [place];  Service: Ophthalmology;  Laterality: Left;   SCLERAL BUCKLE Right 12/13/2020   Procedure: SCLERAL BUCKLE WITH CRYO;  Surgeon: Sherlynn Stalls, MD;  Location: [place];  Service: Ophthalmology;  Laterality: Right;    OB History     Gravida  1   Para  1   Term  1   Preterm  0   AB  0   Living  1      SAB  0   IAB  0   Ectopic  0   Multiple  0   Live Births  1            Home Medications    Prior to Admission medications   Medication Sig Start Date End Date Taking? Authorizing Provider  acetaZOLAMIDE ER (DIAMOX) 500 MG capsule Take by mouth. 08/16/20   [provider]  amLODipine (NORVASC) 10 MG tablet Take 1 tablet (10 mg total) by mouth daily. 11/08/20   Nafziger, Tommi Rumps, NP  benzonatate (TESSALON) 200 MG capsule Take by mouth. 05/09/21   [provider]  Blood Pressure Monitoring (BLOOD PRESSURE MON/AUTO/WRIST) DEVI Use to monitor blood pressure 09/24/21   Nafziger, Tommi Rumps, NP  carvedilol (COREG) 3.125 MG tablet Take 3.125 mg  by mouth 2 (two) times daily. 11/12/21   [provider]  chlorhexidine (PERIDEX) 0.12 % solution SMARTSIG:By Mouth 09/30/21   [provider]  diclofenac (VOLTAREN) 75 MG EC tablet Take 1 tablet (75 mg total) by mouth 2 (two) times daily. Patient taking differently: Take 75 mg by mouth 2 (two) times daily as needed (  pain). 07/11/20   Hudnall, Sharyn Lull, MD  dicyclomine (BENTYL) 10 MG capsule dicyclomine 10 mg capsule    [provider]  Diethylpropion HCl CR 75 MG TB24 Take by mouth. 03/23/21   [provider]  Dulaglutide (TRULICITY) 4.5 YH/0.6CB SOPN Inject 4.5 mg into the skin once a week. 01/02/22     ferrous sulfate 325 (65 FE) MG tablet Take 1 tablet (325 mg total) by mouth every other day. 02/23/20 07/19/21  Nafziger, Tommi Rumps, NP  fluconazole (DIFLUCAN) 200 MG tablet Take 1 tablet (200 mg total) by mouth every 3 (three) days. 11/22/21   Shelly Bombard, MD  fluticasone-salmeterol (ADVAIR HFA) [phone] MCG/ACT inhaler Inhale 2 puffs into the lungs 2 (two) times daily. 05/29/21   Nafziger, Tommi Rumps, NP  gabapentin (NEURONTIN) 600 MG tablet Take 600 mg by mouth 3 (three) times daily. 05/18/21   [provider]  hydrochlorothiazide (HYDRODIURIL) 25 MG tablet Take 1 tablet (25 mg total) by mouth daily. 11/08/20   Nafziger, Tommi Rumps, NP  ibuprofen (ADVIL) 600 MG tablet Take 1 tablet (600 mg total) by mouth every 6 (six) hours as needed. 1/51/76   Lianne Cure, DO  levonorgestrel (KYLEENA) 19.5 MG IUD Kyleena 17.5 mcg/24 hrs (37yr) 19.'5mg'$  intrauterine device  Take 1 device by intrauterine route.    [provider]  loratadine (CLARITIN) 10 MG tablet Take 10 mg by mouth daily as needed for allergies.     [provider]  nortriptyline (PAMELOR) 25 MG capsule Take 1 capsule (25 mg total) by mouth at bedtime. 11/05/21   CGregor Hams MD  ondansetron (ZOFRAN-ODT) 8 MG disintegrating tablet ondansetron 8 mg disintegrating tablet    [provider]   oxyCODONE-acetaminophen (PERCOCET/ROXICET) 5-325 MG tablet Take 1 tablet by mouth 5 (five) times daily.    [provider]  pantoprazole (PROTONIX) 40 MG tablet Take 1 tablet (40 mg total) by mouth daily. 06/11/21   MMaryjane Hurter MD  polyethylene glycol powder (GLYCOLAX/MIRALAX) 17 GM/SCOOP powder Take 17 g by mouth daily as needed for moderate constipation.  10/11/19   [provider]  SUMAtriptan (IMITREX) 50 MG tablet Take 1 tablet (50 mg total) by mouth every 2 (two) hours as needed for migraine. May repeat in 2 hours if headache persists or recurs. 08/08/21   Nafziger, CTommi Rumps NP  tiZANidine (ZANAFLEX) 4 MG tablet Take 4 mg by mouth 3 (three) times daily as needed. 08/28/21   [provider]  topiramate (TOPAMAX) 50 MG tablet Take by mouth. 03/21/21   [provider]  Vitamin D, Ergocalciferol, (DRISDOL) 1.25 MG (50000 UNIT) CAPS capsule Take 1 capsule (50,000 Units total) by mouth every 7 (seven) days. Patient taking differently: Take 50,000 Units by mouth every Monday. 05/06/21   ULaqueta Linden MD    Family History Family History  Problem Relation Age of Onset   Diabetes Maternal Aunt    Hypertension Maternal Uncle    Depression Maternal Grandfather    Diabetes Maternal Grandfather    Hypertension Maternal Grandfather    Arthritis Mother    High blood pressure Mother    Depression Mother    Sleep apnea Mother    Obesity Mother    Diabetes Mother    Hypertension Mother    Kidney failure Mother    Sleep apnea Father    Obesity Father    Healthy Daughter     Social History Social History   Tobacco Use   Smoking status: Never   Smokeless  tobacco: Never  Vaping Use   Vaping Use: Never used  Substance Use Topics   Alcohol use: Not Currently    Comment: social    Drug use: No     Allergies   Bee pollen, Bee venom, Hydrocodone-acetaminophen, Peach flavor, Pollen extract, Remdesivir, Cortisone, and Cortizone-10  [hydrocortisone]   Review of Systems Review of Systems   Physical Exam Triage Vital Signs ED Triage Vitals  Enc Vitals Group     BP 03/09/22 1736 (!) 145/109     Pulse Rate 03/09/22 1736 (!) 107     Resp 03/09/22 1736 20     Temp 03/09/22 1736 98.2 F (36.8 C)     Temp src --      SpO2 03/09/22 1736 97 %     Weight --      Height --      Head Circumference --      Peak Flow --      Pain Score 03/09/22 1734 10     Pain Loc --      Pain Edu? --      Excl. in [place]? --    No data found.  Updated Vital Signs BP 138/88   Pulse 95   Temp 98.2 F (36.8 C)   Resp 20   LMP 02/12/2022   SpO2 92%   Physical Exam Vitals and nursing note reviewed.  Constitutional:      General: She is not in acute distress. HENT:     Nose: Nose normal.  Eyes:     Conjunctiva/sclera: Conjunctivae normal.     Pupils: Pupils are equal, round, and reactive to light.  Cardiovascular:     Rate and Rhythm: Normal rate and regular rhythm.     Heart sounds: Normal heart sounds.  Pulmonary:     Effort: Pulmonary effort is normal.     Breath sounds: Normal breath sounds.  Musculoskeletal:        General: Swelling and tenderness present.     Cervical back: Normal range of motion. No rigidity.     Right knee: Normal.     Left knee: Normal.     Right ankle: Normal.     Left ankle: Swelling present. Tenderness present. Decreased range of motion.     Comments: Left ankle with mild swelling to lateral malleolus, limited range of motion due to swelling and pain.  DP and PT pulses intact.  Sensation intact.  Neurological:     Mental Status: She is alert and oriented to person, place, and time.     Sensory: Sensation is intact.     Motor: Motor function is intact.     Coordination: Coordination is intact.     UC Treatments / Results  Labs (all labs ordered are listed, but only abnormal results are displayed) Labs Reviewed - No data to display  EKG  Radiology DG Ankle Complete Left  Result  Date: 03/09/2022 CLINICAL DATA:  Acute LEFT ankle pain following injury. Initial encounter. EXAM: LEFT ANKLE COMPLETE - 3 VIEW COMPARISON:  None Available. FINDINGS: There is no evidence of fracture, subluxation or dislocation. Soft tissue swelling is noted. No definite joint effusion noted. The joint spaces are unremarkable. IMPRESSION: Soft tissue swelling without acute bony abnormality. Electronically Signed   By: Margarette Canada M.D.   On: 03/09/2022 17:56    Procedures Procedures (including critical care time)  Medications Ordered in UC Medications  ketorolac (TORADOL) injection 30 mg (30 mg Intramuscular Given 03/09/22 1808)  Initial Impression / Assessment and Plan / UC Course  I have reviewed the triage vital signs and the nursing notes.  Pertinent labs & imaging results that were available during my care of the patient were reviewed by me and considered in my medical decision making (see chart for details).   X-ray imaging in clinic today showing no acute bony abnormality.  Suspect this may be an ankle sprain.  She does have a good area of swelling to the lateral ankle.  Pain with range of motion.  She is still unable to bear weight on the leg.  We gave a dose of ketorolac in clinic today.  Patient states pain is somewhat improved.  Also applying ice. Ace wrap bandage for compression. BP originally elevated in clinic today but normalized on recheck. I recommend that patient continue to use anti-inflammatory and ice at home, elevate the leg.  I recommend that she follow-up with orthopedics if her symptoms persist.  Contact information for [facility] provided.  Patient agrees to plan, return precautions were discussed, she is discharged in stable condition.  Final Clinical Impressions(s) / UC Diagnoses   Final diagnoses:  Acute left ankle pain     Discharge Instructions      Please alternate ibuprofen and Tylenol as needed for pain.  Do not use ibuprofen in the next 12 hours as you  have just received a anti-inflammatory dose tonight.  I recommend putting ice on the area, elevating the leg.  You should follow-up with orthopedics.    ED Prescriptions   None    PDMP not reviewed this encounter.   Kyra Leyland 03/09/22 1844

## 2022-03-09 NOTE — ED Triage Notes (Signed)
PT reports she rolled her Lt ankle today while walking on flat ground. Pt reports she can not apply pressure to ankle.

## 2022-03-09 NOTE — Discharge Instructions (Addendum)
Please alternate ibuprofen and Tylenol as needed for pain.  Do not use ibuprofen in the next 12 hours as you have just received a anti-inflammatory dose tonight.  I recommend putting ice on the area, elevating the leg.  You should follow-up with orthopedics.

## 2022-03-10 DIAGNOSIS — M25572 Pain in left ankle and joints of left foot: Secondary | ICD-10-CM | POA: Diagnosis not present

## 2022-03-11 ENCOUNTER — Ambulatory Visit: Payer: Medicaid Other | Attending: Family Medicine

## 2022-03-11 NOTE — Therapy (Incomplete)
OUTPATIENT PHYSICAL THERAPY LOWER EXTREMITY EVALUATION   Patient Name: Maureen Lewis MRN: [id number] DOB:1995-12-03, 26 y.o., female Today's Date: 03/11/2022    Past Medical History:  Diagnosis Date   Abnormal uterine bleeding    Acid reflux    Alpha thalassemia trait    Amenorrhea    Anemia    no current med.   Asthma    Back pain    Cesarean delivery delivered 10/11/2019   10/09/2019 - primary CS for failed IOL   Chest pain    resolved   Complication of anesthesia    states had to keep giving her anesthesia during EGD   Constipation    Depression    "I'm good"   Fibromyalgia    Finger mass, left 03/2017   middle finger   Gestational diabetes 05/23/2019   hx - with pregnancy only    Gestational hypertension 09/23/2019   Guidelines for Antenatal Testing and Sonography  (with updated ICD-10 codes)  Updated  09/30/19 with Dr. Tama High  INDICATION U/S 2 X week NST/AFI  or full BPP wkly DELIVERY Diabetes   A1 - good control - O24.410    A2 - good control - O24.419      A2  - poor control or poor compliance - O24.419, E11.65   (Macrosomia or polyhydramnios) **E11.65 is extra code for poor control**    A2/B - O24.   Headache    Hypertension    Infection    UTI   Irritable bowel syndrome (IBS)    Joint pain    Morbid obesity with body mass index (BMI) of 45.0 to 49.9 in adult [facility])    Obesity during pregnancy, antepartum 04/06/2019   Body mass index is 53.71 kg/m.  Recommendations '[x]'$  Aspirin 81 mg daily after 12 weeks; discontinue after 36 weeks '[ ]'$  Nutrition consult '[ ]'$  Weight gain 11-20 lbs for singleton and 25-35 lbs for twin pregnancy (IOM guidelines) Higher class of obesity patients recommended to gain closer to lower limit  Weight loss is associated with adverse outcomes '[ ]'$  Baseline and surveillance labs (pulled in fr   Plantar fasciitis, bilateral    resolved   Polycystic ovary syndrome    Pre-diabetes    no meds, diet controlled, diet not check  blood sugar   Prediabetes    "prediabetes"   Pregnancy induced hypertension    Shortness of breath    albuterol inhaler   Sleep apnea    no CPAP use   Supervision of normal first pregnancy 04/06/2019   [name]: [ x]Initial [ x]12 '[ ]'$ 20 '[ ]'$ 28 '[ ]'$ 32 '[ ]'$ 36 '[ ]'$ 38 '[ ]'$ 39 '[ ]'$ 40 Nursing Staff Provider Office Location [facility]  Dating   LMP Language  English  Anatomy US  Nml Flu Vaccine  Declined 07/28/19 Genetic Screen  NIPS: low risks   AFP:   negative  TDaP vaccine   08/25/19 Hgb A1C or  GTT Early A1C 5.8 Third trimester: GDM insulin Rhogam  NA   LAB RESULTS  Feeding Plan Breast Blood Type   Vertigo 2017   Past Surgical History:  Procedure Laterality Date   CESAREAN SECTION N/A 10/09/2019   Procedure: CESAREAN SECTION;  Surgeon: Aletha Halim, MD;  [facility]: [facility];  Service: Obstetrics;  Laterality: N/A;   COLONOSCOPY WITH PROPOFOL  10/07/2016   DILATION AND CURETTAGE OF UTERUS     ESOPHAGOGASTRODUODENOSCOPY  12/21/2015   EXCISION MASS UPPER EXTREMETIES Left 04/21/2017   Procedure: EXCISION MASS LEFT MIDDLE  FINGER;  Surgeon: Daryll Brod, MD;  Location: [place];  Service: Orthopedics;  Laterality: Left;   IR FL GUIDED LOC OF NEEDLE/CATH TIP FOR SPINAL INJECTION RT  03/13/2020   PHOTOCOAGULATION WITH LASER Left 12/13/2020   Procedure: RETINOPLEXY LEFT EYE;  Surgeon: Sherlynn Stalls, MD;  Location: [place];  Service: Ophthalmology;  Laterality: Left;   SCLERAL BUCKLE Right 12/13/2020   Procedure: SCLERAL BUCKLE WITH CRYO;  Surgeon: Sherlynn Stalls, MD;  Location: [place];  Service: Ophthalmology;  Laterality: Right;   Patient Active Problem List   Diagnosis Date Noted   IUD check up 05/15/2021   Encounter for insertion of mirena IUD 04/01/2021   Levator spasm 11/21/2020   Galactorrhea 11/19/2020   Hx of migraine headaches 04/09/2020   Contraceptive management 11/22/2019   Positive RPR test 10/09/2019   HTN (hypertension) 09/23/2019   Migraine headache 07/12/2019    Alpha thalassemia trait    Vitamin D deficiency 09/16/2017   Prediabetes 09/16/2017   Depression 09/16/2017   Class 3 severe obesity with serious comorbidity and body mass index (BMI) of 50.0 to 59.9 in adult ([place]) 09/16/2017   PCOS (polycystic ovarian syndrome) 08/13/2017   Mass 05/06/2017   Vertigo 10/06/2016   Microcytic anemia 09/26/2016   Obesity hypoventilation syndrome ([place]) 08/28/2016   Benign paroxysmal positional vertigo due to bilateral vestibular disorder 08/28/2016   OSA (obstructive sleep apnea) 08/28/2016   Hypersomnia with sleep apnea 08/28/2016   Female hirsutism 08/28/2016   GERD (gastroesophageal reflux disease) 02/24/2015   Constipation 10/02/2014    PCP: Dorothyann Peng, NP  REFERRING PROVIDER: Gregor Hams, MD  REFERRING DIAG:  [phone] (ICD-10-CM) - Right hip pain  M25.561 (ICD-10-CM) - Acute pain of right knee  M25.571,G89.29 (ICD-10-CM) - Chronic pain of right ankle    THERAPY DIAG:  No diagnosis found.  Rationale for Evaluation and Treatment Rehabilitation  ONSET DATE: ***  SUBJECTIVE:   SUBJECTIVE STATEMENT: ***  PERTINENT HISTORY: ***  PAIN:  Are you having pain? {OPRCPAIN:27236}  PRECAUTIONS: {Therapy precautions:24002}  WEIGHT BEARING RESTRICTIONS {Yes ***/No:24003}  FALLS:  Has patient fallen in last 6 months? {fallsyesno:27318}  LIVING ENVIRONMENT: Lives with: {OPRC lives with:25569::"lives with their family"} Lives in: {Lives in:25570} Stairs: {opstairs:27293} Has following equipment at home: {Assistive devices:23999}  OCCUPATION: ***  PLOF: {PLOF:24004}  PATIENT GOALS ***   OBJECTIVE:   DIAGNOSTIC FINDINGS:  02/26/2022: DG Ankle Complete Right: IMPRESSION: Mild lateral malleolar soft tissue swelling.  No acute fracture.  02/26/2022: DG Hip Unilat with or without pelvis 2-3 views Right: IMPRESSION: IMPRESSION: No acute fracture or dislocation.  02/26/2022: DG Knee AP/LAT W/ [place]  Right: IMPRESSION: Negative.  03/09/2022: DG Ankle Complete Left: IMPRESSION: Soft tissue swelling without acute bony abnormality.  PATIENT SURVEYS:  LEFS ***  COGNITION:  Overall cognitive status: {cognition:24006}     SENSATION: {sensation:27233}  MUSCLE LENGTH: Hamstrings: Right *** deg; Left *** deg Marcello Moores test: Right *** deg; Left *** deg  POSTURE: {posture:25561}  PALPATION: ***  LOWER EXTREMITY ROM:  A/PROM Right eval Left eval  Hip flexion    Hip extension    Hip abduction    Hip adduction    Hip internal rotation    Hip external rotation    Knee flexion    Knee extension    Ankle dorsiflexion    Ankle plantarflexion    Ankle inversion    Ankle eversion     (Blank rows = not tested)  LOWER EXTREMITY MMT:  MMT Right eval Left eval  Hip flexion    Hip extension    Hip abduction    Hip adduction    Hip internal rotation    Hip external rotation    Knee flexion    Knee extension    Ankle dorsiflexion    Ankle plantarflexion    Ankle inversion    Ankle eversion     (Blank rows = not tested)  LOWER EXTREMITY SPECIAL TESTS:  {LEspecialtests:26242}  FUNCTIONAL TESTS:  {Functional tests:24029}  GAIT: Distance walked: *** Assistive device utilized: {Assistive devices:23999} Level of assistance: {Levels of assistance:24026} Comments: ***    TODAY'S TREATMENT: ***   PATIENT EDUCATION:  Education details: *** Person educated: {Person educated:25204} Education method: {Education Method:25205} Education comprehension: {Education Comprehension:25206}   HOME EXERCISE PROGRAM: ***  ASSESSMENT:  CLINICAL IMPRESSION: Patient is a *** y.o. *** who was seen today for physical therapy evaluation and treatment for ***.    OBJECTIVE IMPAIRMENTS {opptimpairments:25111}.   ACTIVITY LIMITATIONS {activitylimitations:27494}  PARTICIPATION LIMITATIONS: {participationrestrictions:25113}  PERSONAL FACTORS {Personal factors:25162} are also  affecting patient's functional outcome.   REHAB POTENTIAL: {rehabpotential:25112}  CLINICAL DECISION MAKING: {clinical decision making:25114}  EVALUATION COMPLEXITY: {Evaluation complexity:25115}   GOALS: Goals reviewed with patient? Yes  SHORT TERM GOALS: Target date: 04/08/2022   Pt will report understanding and adherence to her HEP in order to promote independence in the management of her primary impairments.  Baseline: HEP provided at eval Goal status: INITIAL   LONG TERM GOALS: Target date: 05/06/2022   Pt will achieve LEFS score of *** in order to demonstrate improved functional ability as it relates to her lower extremity impairments. Baseline: *** Goal status: INITIAL  2.  *** Baseline:  Goal status: {GOALSTATUS:25110}  3.  *** Baseline:  Goal status: {GOALSTATUS:25110}  4.  *** Baseline:  Goal status: {GOALSTATUS:25110}  5.  *** Baseline:  Goal status: {GOALSTATUS:25110}  6.  *** Baseline:  Goal status: {GOALSTATUS:25110}   PLAN: PT FREQUENCY: {rehab frequency:25116}  PT DURATION: {rehab duration:25117}  PLANNED INTERVENTIONS: {rehab planned interventions:25118::"Therapeutic exercises","Therapeutic activity","Neuromuscular re-education","Balance training","Gait training","Patient/Family education","Joint mobilization"}  PLAN FOR NEXT SESSION: ***   Cherie Ouch, PT 03/11/2022, 10:14 AM

## 2022-03-26 ENCOUNTER — Inpatient Hospital Stay (HOSPITAL_COMMUNITY)
Admission: AD | Admit: 2022-03-26 | Discharge: 2022-03-26 | Disposition: A | Discharge status: Home / Self Care | Payer: Medicaid Other | Attending: Obstetrics and Gynecology | Admitting: Obstetrics and Gynecology

## 2022-03-26 ENCOUNTER — Encounter (HOSPITAL_COMMUNITY): Payer: Self-pay | Admitting: *Deleted

## 2022-03-26 DIAGNOSIS — Z3202 Encounter for pregnancy test, result negative: Secondary | ICD-10-CM | POA: Diagnosis not present

## 2022-03-26 DIAGNOSIS — R109 Unspecified abdominal pain: Secondary | ICD-10-CM | POA: Insufficient documentation

## 2022-03-26 LAB — POCT PREGNANCY, URINE: Preg Test, Ur: NEGATIVE

## 2022-03-26 LAB — HCG, QUANTITATIVE, PREGNANCY: hCG, Beta Chain, Quant, S: <1 m[IU]/mL (ref <5)

## 2022-03-26 NOTE — MAU Provider Note (Signed)
Event Date/Time   First Provider Initiated Contact with Patient 03/26/22 1019      S [name] is a 26 y.o. G1P1001 patient who presents to MAU today with complaint of abdominal pain and positive UPT. Patient reports she had a positive UPT on Friday, but a negative on Tuesday.  She states she has been experiencing light bleeding since yesterday and abdominal pain/cramping for 3 days.  She rates the pain a 9/10, but has not taken any medication as she reports "many things do not work for me." Patient reports she takes HCTZ for her blood pressure.   O BP (!) 172/102   Pulse 90   Temp 98.4 F (36.9 C) (Oral)   Resp 20   Ht 6' 2.5" (1.892 m)   Wt (!) 223.4 kg   LMP 02/12/2022   SpO2 97%   BMI 62.39 kg/m  Physical Exam Vitals reviewed.  Constitutional:      General: She is not in acute distress.    Appearance: Normal appearance. She is obese. She is not toxic-appearing.  HENT:     Head: Normocephalic and atraumatic.  Eyes:     Conjunctiva/sclera: Conjunctivae normal.  Cardiovascular:     Rate and Rhythm: Normal rate.  Pulmonary:     Effort: Pulmonary effort is normal. No respiratory distress.  Musculoskeletal:     Cervical back: Normal range of motion.  Feet:     Comments: Left Leg with Cast Boot in place Neurological:     Mental Status: She is alert and oriented to person, place, and time.  Psychiatric:        Mood and Affect: Mood normal.        Behavior: Behavior normal.    A Medical screening exam complete Negative UPT  P Informed of negative UPT today. Option to complete hCG and patient agreeable. Discussed waiting for results or reviewing via mychart. Further informed that if levels are above 25 will require further testing. Patient agreeable and decides discharge. Reports she has not taken her HCTZ today.  Precautions given. Will call for results requiring follow up, but will otherwise communicate via mychart.    Gavin Pound,  CNM 03/26/2022 10:20 AM

## 2022-03-26 NOTE — MAU Note (Addendum)
Maureen Lewis is a 26 y.o. here in MAU reporting: pelvic and low back pain, started 3 days ago. Becoming more severe as time goes by. Had really light bleeding 2 days ago. IUD removed 5/2, has appt for a different IUD< currently using "nothing" LMP: 4/26 Onset of complaint: 3days ago Pain score: 7 Vitals:   03/26/22 0937  BP: (!) 171/106  Pulse: 90  Resp: 20  Temp: 98.4 F (36.9 C)  SpO2: 97%     Lab orders placed from triage:  UPT  +HPT last wk, neg this wk

## 2022-03-27 ENCOUNTER — Ambulatory Visit: Payer: Medicaid Other | Admitting: Adult Health

## 2022-03-27 ENCOUNTER — Encounter: Payer: Self-pay | Admitting: Adult Health

## 2022-03-27 VITALS — BP 122/70 | HR 97 | Temp 97.5°F | Wt >= 6400 oz

## 2022-03-27 DIAGNOSIS — I10 Essential (primary) hypertension: Secondary | ICD-10-CM

## 2022-03-27 DIAGNOSIS — M25571 Pain in right ankle and joints of right foot: Secondary | ICD-10-CM | POA: Diagnosis not present

## 2022-03-27 DIAGNOSIS — M25562 Pain in left knee: Secondary | ICD-10-CM | POA: Diagnosis not present

## 2022-03-27 DIAGNOSIS — M25572 Pain in left ankle and joints of left foot: Secondary | ICD-10-CM | POA: Diagnosis not present

## 2022-03-27 NOTE — Progress Notes (Signed)
Subjective:    Patient ID: Maureen Lewis, female    DOB: 1996/04/05, 26 y.o.   MRN: [id number]  HPI  26 year old female who  has a past medical history of Abnormal uterine bleeding, Acid reflux, Alpha thalassemia trait, Amenorrhea, Anemia, Asthma, Back pain, Cesarean delivery delivered (10/11/2019), Chest pain, Complication of anesthesia, Constipation, Depression, Fibromyalgia, Finger mass, left (03/2017), Gestational diabetes (05/23/2019), Gestational hypertension (09/23/2019), Headache, Hypertension, Infection, Irritable bowel syndrome (IBS), Joint pain, Morbid obesity with body mass index (BMI) of 45.0 to 49.9 in adult [facility]), Obesity during pregnancy, antepartum (04/06/2019), Plantar fasciitis, bilateral, Polycystic ovary syndrome, Pre-diabetes, Prediabetes, Pregnancy induced hypertension, Shortness of breath, Sleep apnea, Supervision of normal first pregnancy (04/06/2019), and Vertigo (2017).  She was seen at the ER yesterday d/t concern of pregnancy as her cycle was late. Her pregnancy was confirmed negative but her BP was elevated at 172/102. She is here to make sure BP is controlled.   She was under a lot of stress yesterday    Review of Systems See HPI   Past Medical History:  Diagnosis Date   Abnormal uterine bleeding    Acid reflux    Alpha thalassemia trait    Amenorrhea    Anemia    no current med.   Asthma    Back pain    Cesarean delivery delivered 10/11/2019   10/09/2019 - primary CS for failed IOL   Chest pain    resolved   Complication of anesthesia    states had to keep giving her anesthesia during EGD   Constipation    Depression    "I'm good"   Fibromyalgia    Finger mass, left 03/2017   middle finger   Gestational diabetes 05/23/2019   hx - with pregnancy only    Gestational hypertension 09/23/2019   Guidelines for Antenatal Testing and Sonography  (with updated ICD-10 codes)  Updated  10-06-19 with Dr. Tama High  INDICATION U/S 2 X  week NST/AFI  or full BPP wkly DELIVERY Diabetes   A1 - good control - O24.410    A2 - good control - O24.419      A2  - poor control or poor compliance - O24.419, E11.65   (Macrosomia or polyhydramnios) **E11.65 is extra code for poor control**    A2/B - O24.   Headache    Hypertension    Infection    UTI   Irritable bowel syndrome (IBS)    Joint pain    Morbid obesity with body mass index (BMI) of 45.0 to 49.9 in adult [facility])    Obesity during pregnancy, antepartum 04/06/2019   Body mass index is 53.71 kg/m.  Recommendations '[x]'$  Aspirin 81 mg daily after 12 weeks; discontinue after 36 weeks '[ ]'$  Nutrition consult '[ ]'$  Weight gain 11-20 lbs for singleton and 25-35 lbs for twin pregnancy (IOM guidelines) Higher class of obesity patients recommended to gain closer to lower limit  Weight loss is associated with adverse outcomes '[ ]'$  Baseline and surveillance labs (pulled in fr   Plantar fasciitis, bilateral    resolved   Polycystic ovary syndrome    Pre-diabetes    no meds, diet controlled, diet not check blood sugar   Prediabetes    "prediabetes"   Pregnancy induced hypertension    Shortness of breath    albuterol inhaler   Sleep apnea    no CPAP use   Supervision of normal first pregnancy 04/06/2019   [name]: [ x]Initial [  x]12 '[ ]'$ 20 '[ ]'$ 28 '[ ]'$ 32 '[ ]'$ 36 '[ ]'$ 38 '[ ]'$ 39 '[ ]'$ 40 Nursing Staff Provider Office Location [facility]  Dating   LMP Language  English  Anatomy US  Nml Flu Vaccine  Declined 07/28/19 Genetic Screen  NIPS: low risks   AFP:   negative  TDaP vaccine   08/25/19 Hgb A1C or  GTT Early A1C 5.8 Third trimester: GDM insulin Rhogam  NA   LAB RESULTS  Feeding Plan Breast Blood Type   Vertigo 2017    Social History   Socioeconomic History   Marital status: Single    Spouse name: Not on file   Number of children: Not on file   Years of education: Not on file   Highest education level: Not on file  Occupational History   Occupation: Chemical engineer    Employer: [id number]   Tobacco Use   Smoking status: Never   Smokeless tobacco: Never  Vaping Use   Vaping Use: Never used  Substance and Sexual Activity   Alcohol use: Not Currently    Comment: social    Drug use: No   Sexual activity: Yes    Birth control/protection: I.U.D.  Other Topics Concern   Not on file  Social History Narrative   Right handed    Soda sometimes   Lives with mom and cousin, a grandmother in [place] also helps care for her.       She is in nursing school.    Social Determinants of Health   Financial Resource Strain: Not on file  Food Insecurity: Not on file  Transportation Needs: Not on file  Physical Activity: Not on file  Stress: Not on file  Social Connections: Not on file  Intimate Partner Violence: Not on file    Past Surgical History:  Procedure Laterality Date   CESAREAN SECTION N/A 10/09/2019   Procedure: CESAREAN SECTION;  Surgeon: Aletha Halim, MD;  [facility];  Service: Obstetrics;  Laterality: N/A;   COLONOSCOPY WITH PROPOFOL  10/07/2016   DILATION AND CURETTAGE OF UTERUS     ESOPHAGOGASTRODUODENOSCOPY  12/21/2015   EXCISION MASS UPPER EXTREMETIES Left 04/21/2017   Procedure: EXCISION MASS LEFT MIDDLE FINGER;  Surgeon: Daryll Brod, MD;  Location: [place];  Service: Orthopedics;  Laterality: Left;   IR FL GUIDED LOC OF NEEDLE/CATH TIP FOR SPINAL INJECTION RT  03/13/2020   PHOTOCOAGULATION WITH LASER Left 12/13/2020   Procedure: RETINOPLEXY LEFT EYE;  Surgeon: Sherlynn Stalls, MD;  Location: [place];  Service: Ophthalmology;  Laterality: Left;   SCLERAL BUCKLE Right 12/13/2020   Procedure: SCLERAL BUCKLE WITH CRYO;  Surgeon: Sherlynn Stalls, MD;  Location: [place];  Service: Ophthalmology;  Laterality: Right;    Family History  Problem Relation Age of Onset   Diabetes Maternal Aunt    Hypertension Maternal Uncle    Depression Maternal Grandfather    Diabetes Maternal Grandfather    Hypertension Maternal Grandfather    Arthritis  Mother    High blood pressure Mother    Depression Mother    Sleep apnea Mother    Obesity Mother    Diabetes Mother    Hypertension Mother    Kidney failure Mother    Sleep apnea Father    Obesity Father    Healthy Daughter     Allergies  Allergen Reactions   Bee Pollen Hives, Shortness Of Breath and Swelling   Bee Venom Hives, Shortness Of Breath and Swelling   Hydrocodone-Acetaminophen Anaphylaxis    No  problem when she takes Tylenol   Psychologist, counselling, Shortness Of Breath and Other (See Comments)    SWELLING OF MOUTH   Pollen Extract Hives, Shortness Of Breath and Swelling   Remdesivir Swelling    Angioedema 8/25   Cortisone Itching and Swelling   Cortizone-10 [Hydrocortisone]     Swelling and itching    Current Outpatient Medications on File Prior to Visit  Medication Sig Dispense Refill   acetaZOLAMIDE ER (DIAMOX) 500 MG capsule Take by mouth.     amLODipine (NORVASC) 10 MG tablet Take 1 tablet (10 mg total) by mouth daily. 90 tablet 3   benzonatate (TESSALON) 200 MG capsule Take by mouth.     Blood Pressure Monitoring (BLOOD PRESSURE MON/AUTO/WRIST) DEVI Use to monitor blood pressure 1 each 0   chlorhexidine (PERIDEX) 0.12 % solution SMARTSIG:By Mouth     diclofenac (VOLTAREN) 75 MG EC tablet Take 1 tablet (75 mg total) by mouth 2 (two) times daily. (Patient taking differently: Take 75 mg by mouth 2 (two) times daily as needed (pain).) 60 tablet 1   dicyclomine (BENTYL) 10 MG capsule dicyclomine 10 mg capsule     Diethylpropion HCl CR 75 MG TB24 Take by mouth.     Dulaglutide (TRULICITY) 4.5 FM/3.8GY SOPN Inject 4.5 mg into the skin once a week. 6 mL 1   fluconazole (DIFLUCAN) 200 MG tablet Take 1 tablet (200 mg total) by mouth every 3 (three) days. 3 tablet 2   fluticasone-salmeterol (ADVAIR HFA) 115-21 MCG/ACT inhaler Inhale 2 puffs into the lungs 2 (two) times daily. 3 each 3   gabapentin (NEURONTIN) 600 MG tablet Take 600 mg by mouth 3 (three) times daily.      hydrochlorothiazide (HYDRODIURIL) 25 MG tablet Take 1 tablet (25 mg total) by mouth daily. 90 tablet 3   ibuprofen (ADVIL) 600 MG tablet Take 1 tablet (600 mg total) by mouth every 6 (six) hours as needed. 30 tablet 0   loratadine (CLARITIN) 10 MG tablet Take 10 mg by mouth daily as needed for allergies.      nortriptyline (PAMELOR) 25 MG capsule Take 1 capsule (25 mg total) by mouth at bedtime. 30 capsule 2   ondansetron (ZOFRAN-ODT) 8 MG disintegrating tablet ondansetron 8 mg disintegrating tablet     oxyCODONE-acetaminophen (PERCOCET/ROXICET) 5-325 MG tablet Take 1 tablet by mouth 5 (five) times daily.     pantoprazole (PROTONIX) 40 MG tablet Take 1 tablet (40 mg total) by mouth daily. 90 tablet 0   polyethylene glycol powder (GLYCOLAX/MIRALAX) 17 GM/SCOOP powder Take 17 g by mouth daily as needed for moderate constipation.      SUMAtriptan (IMITREX) 50 MG tablet Take 1 tablet (50 mg total) by mouth every 2 (two) hours as needed for migraine. May repeat in 2 hours if headache persists or recurs. 10 tablet 1   tiZANidine (ZANAFLEX) 4 MG tablet Take 4 mg by mouth 3 (three) times daily as needed.     topiramate (TOPAMAX) 50 MG tablet Take by mouth.     Vitamin D, Ergocalciferol, (DRISDOL) 1.25 MG (50000 UNIT) CAPS capsule Take 1 capsule (50,000 Units total) by mouth every 7 (seven) days. (Patient taking differently: Take 50,000 Units by mouth every Monday.) 4 capsule 0   carvedilol (COREG) 3.125 MG tablet Take 3.125 mg by mouth 2 (two) times daily. (Patient not taking: Reported on 03/27/2022)     ferrous sulfate 325 (65 FE) MG tablet Take 1 tablet (325 mg total) by mouth every other day. [place]  tablet 1   levonorgestrel (KYLEENA) 19.5 MG IUD Kyleena 17.5 mcg/24 hrs (39yr) 19.'5mg'$  intrauterine device  Take 1 device by intrauterine route. (Patient not taking: Reported on 03/27/2022)     No current facility-administered medications on file prior to visit.    LMP 02/12/2022       Objective:   Physical  Exam Vitals and nursing note reviewed.  Constitutional:      Appearance: Normal appearance.  Cardiovascular:     Rate and Rhythm: Normal rate and regular rhythm.     Pulses: Normal pulses.     Heart sounds: Normal heart sounds.  Pulmonary:     Effort: Pulmonary effort is normal.     Breath sounds: Normal breath sounds.  Musculoskeletal:        General: Normal range of motion.  Skin:    General: Skin is warm and dry.  Neurological:     General: No focal deficit present.     Mental Status: She is alert and oriented to person, place, and time.  Psychiatric:        Mood and Affect: Mood normal.        Behavior: Behavior normal.        Thought Content: Thought content normal.        Judgment: Judgment normal.       Assessment & Plan:  1. Essential hypertension - BP controlled in the office. No change in medications   CDorothyann Peng NP

## 2022-04-02 DIAGNOSIS — N760 Acute vaginitis: Secondary | ICD-10-CM | POA: Diagnosis not present

## 2022-04-02 DIAGNOSIS — N939 Abnormal uterine and vaginal bleeding, unspecified: Secondary | ICD-10-CM | POA: Diagnosis not present

## 2022-04-02 DIAGNOSIS — Z30430 Encounter for insertion of intrauterine contraceptive device: Secondary | ICD-10-CM | POA: Diagnosis not present

## 2022-04-04 DIAGNOSIS — M419 Scoliosis, unspecified: Secondary | ICD-10-CM | POA: Diagnosis not present

## 2022-04-04 DIAGNOSIS — R7303 Prediabetes: Secondary | ICD-10-CM | POA: Diagnosis not present

## 2022-04-04 DIAGNOSIS — M25551 Pain in right hip: Secondary | ICD-10-CM | POA: Diagnosis not present

## 2022-04-04 DIAGNOSIS — Z6841 Body Mass Index (BMI) 40.0 and over, adult: Secondary | ICD-10-CM | POA: Diagnosis not present

## 2022-04-04 DIAGNOSIS — M25571 Pain in right ankle and joints of right foot: Secondary | ICD-10-CM | POA: Diagnosis not present

## 2022-04-04 DIAGNOSIS — N915 Oligomenorrhea, unspecified: Secondary | ICD-10-CM | POA: Diagnosis not present

## 2022-04-04 DIAGNOSIS — M545 Low back pain, unspecified: Secondary | ICD-10-CM | POA: Diagnosis not present

## 2022-04-04 DIAGNOSIS — Z79899 Other long term (current) drug therapy: Secondary | ICD-10-CM | POA: Diagnosis not present

## 2022-04-08 DIAGNOSIS — Z79899 Other long term (current) drug therapy: Secondary | ICD-10-CM | POA: Diagnosis not present

## 2022-04-10 DIAGNOSIS — F439 Reaction to severe stress, unspecified: Secondary | ICD-10-CM | POA: Diagnosis not present

## 2022-04-16 ENCOUNTER — Ambulatory Visit: Payer: Medicaid Other | Admitting: Family Medicine

## 2022-04-16 NOTE — Progress Notes (Deleted)
   I, Wendy Poet, LAT, ATC, am serving as scribe for Dr. [name].  Maureen Lewis is a 26 y.o. female who presents to [place] at [facility] today for f/u of ???  She was last seen by Dr. Georgina Snell on 02/26/22 for R lateral hip, R ant knee and R lateral ankle pain after her hip popped and she fell against a wall while at work and rolled her R ankle.  She was referred to PT at the [facility] location but never completed any visits.  Since then, she's been seen at [facility] for L ankle pain.  Today, pt reports    Diagnostic testing: L ankle XR- 03/09/22; R knee, R hip and R ankle XR- 02/26/22; L hip, L knee and L ankle XR- 10/15/21; R ankle XR- 03/29/21  Pertinent review of systems: ***  Relevant historical information: ***   Exam:  LMP 02/12/2022  General: Well Developed, well nourished, and in no acute distress.   MSK: ***    Lab and Radiology Results No results found for this or any previous visit (from the past 72 hour(s)). No results found.     Assessment and Plan: 26 y.o. female with ***   PDMP not reviewed this encounter. No orders of the defined types were placed in this encounter.  No orders of the defined types were placed in this encounter.    Discussed warning signs or symptoms. Please see discharge instructions. Patient expresses understanding.   ***

## 2022-04-21 DIAGNOSIS — M25571 Pain in right ankle and joints of right foot: Secondary | ICD-10-CM | POA: Diagnosis not present

## 2022-04-21 DIAGNOSIS — M25562 Pain in left knee: Secondary | ICD-10-CM | POA: Diagnosis not present

## 2022-04-24 ENCOUNTER — Other Ambulatory Visit: Payer: Self-pay

## 2022-04-24 ENCOUNTER — Encounter (HOSPITAL_COMMUNITY): Payer: Self-pay

## 2022-04-24 ENCOUNTER — Emergency Department (HOSPITAL_COMMUNITY)
Admission: EM | Admit: 2022-04-24 | Discharge: 2022-04-25 | Disposition: A | Discharge status: Home / Self Care | Payer: Medicaid Other | Attending: Emergency Medicine | Admitting: Emergency Medicine

## 2022-04-24 DIAGNOSIS — N939 Abnormal uterine and vaginal bleeding, unspecified: Secondary | ICD-10-CM | POA: Insufficient documentation

## 2022-04-24 DIAGNOSIS — R109 Unspecified abdominal pain: Secondary | ICD-10-CM | POA: Insufficient documentation

## 2022-04-24 LAB — CBC
HCT: 44.2 % (ref 36.0–46.0)
Hemoglobin: 13.5 g/dL (ref 12.0–15.0)
MCH: 23.5 pg — ABNORMAL LOW (ref 26.0–34.0)
MCHC: 30.5 g/dL (ref 30.0–36.0)
MCV: 76.9 fL — ABNORMAL LOW (ref 80.0–100.0)
Platelets: 306 10*3/uL (ref 150–400)
RBC: 5.75 MIL/uL — ABNORMAL HIGH (ref 3.87–5.11)
RDW: 15.1 % (ref 11.5–15.5)
WBC: 8.6 10*3/uL (ref 4.0–10.5)
nRBC: 0 % (ref 0.0–0.2)

## 2022-04-24 LAB — COMPREHENSIVE METABOLIC PANEL
ALT: 21 U/L (ref 0–44)
AST: 22 U/L (ref 15–41)
Albumin: 4.2 g/dL (ref 3.5–5.0)
Alkaline Phosphatase: 82 U/L (ref 38–126)
Anion gap: 9 (ref 5–15)
BUN: 12 mg/dL (ref 6–20)
CO2: 25 mmol/L (ref 22–32)
Calcium: 9.4 mg/dL (ref 8.9–10.3)
Chloride: 109 mmol/L (ref 98–111)
Creatinine, Ser: 0.71 mg/dL (ref 0.44–1.00)
GFR, Estimated: >60 mL/min (ref >60)
Glucose, Bld: 94 mg/dL (ref 70–99)
Potassium: 3.7 mmol/L (ref 3.5–5.1)
Sodium: 143 mmol/L (ref 135–145)
Total Bilirubin: 0.8 mg/dL (ref 0.3–1.2)
Total Protein: 8.5 g/dL — ABNORMAL HIGH (ref 6.5–8.1)

## 2022-04-24 LAB — I-STAT BETA HCG BLOOD, ED (MC, WL, AP ONLY): I-stat hCG, quantitative: <5 m[IU]/mL (ref <5)

## 2022-04-24 LAB — URINALYSIS, ROUTINE W REFLEX MICROSCOPIC
Bacteria, UA: NONE SEEN
Bilirubin Urine: NEGATIVE
Glucose, UA: NEGATIVE mg/dL
Ketones, ur: NEGATIVE mg/dL
Nitrite: NEGATIVE
Protein, ur: 30 mg/dL — AB
Specific Gravity, Urine: 1.026 (ref 1.005–1.030)
pH: 5 (ref 5.0–8.0)

## 2022-04-24 LAB — LIPASE, BLOOD: Lipase: 24 U/L (ref 11–51)

## 2022-04-24 MED ORDER — LACTATED RINGERS IV BOLUS
1000.0000 mL | Freq: Once | INTRAVENOUS | Status: AC
  Administered 2022-04-24: 1000 mL via INTRAVENOUS

## 2022-04-24 NOTE — ED Triage Notes (Signed)
Pt c/o abdominal pain and heavy vaginal bleeding x2 weeks. Pt states she has been going through multiple overnight pads during the day. Pt states her menstrual cycle was supposed to start 6/23 but bleeding has not stopped.

## 2022-04-24 NOTE — ED Provider Notes (Signed)
[facility] DEPT Provider Note   CSN: [id number] Arrival date & time: 04/24/22  2029     History  Chief Complaint  Patient presents with   Vaginal Bleeding   Abdominal Pain    Maureen Lewis is a 26 y.o. female.  Presenting to ER due to concern for vaginal bleeding.  Patient reports that for the past 2 weeks she has been having daily vaginal bleeding.  Has gone through a couple pads a day.  Does not have any sort of pain associated with this.  States that she had an IUD placed a couple months ago.  Called her gynecologist.  Due to the amount of bleeding that she had described, she states that she was encouraged to go to ER to get checked out. HPI     Home Medications Prior to Admission medications   Medication Sig Start Date End Date Taking? Authorizing Provider  acetaZOLAMIDE ER (DIAMOX) 500 MG capsule Take by mouth. 08/16/20   [provider]  amLODipine (NORVASC) 10 MG tablet Take 1 tablet (10 mg total) by mouth daily. 11/08/20   Nafziger, Tommi Rumps, NP  benzonatate (TESSALON) 200 MG capsule Take by mouth. 05/09/21   [provider]  Blood Pressure Monitoring (BLOOD PRESSURE MON/AUTO/WRIST) DEVI Use to monitor blood pressure 09/24/21   Nafziger, Tommi Rumps, NP  carvedilol (COREG) 3.125 MG tablet Take 3.125 mg by mouth 2 (two) times daily. Patient not taking: Reported on 03/27/2022 11/12/21   [provider]  chlorhexidine (PERIDEX) 0.12 % solution SMARTSIG:By Mouth 09/30/21   [provider]  diclofenac (VOLTAREN) 75 MG EC tablet Take 1 tablet (75 mg total) by mouth 2 (two) times daily. Patient taking differently: Take 75 mg by mouth 2 (two) times daily as needed (pain). 07/11/20   Hudnall, Sharyn Lull, MD  dicyclomine (BENTYL) 10 MG capsule dicyclomine 10 mg capsule    [provider]  Diethylpropion HCl CR 75 MG TB24 Take by mouth. 03/23/21   [provider]  Dulaglutide (TRULICITY) 4.5 ZM/6.2HU SOPN Inject  4.5 mg into the skin once a week. 01/02/22     ferrous sulfate 325 (65 FE) MG tablet Take 1 tablet (325 mg total) by mouth every other day. 02/23/20 07/19/21  Nafziger, Tommi Rumps, NP  fluconazole (DIFLUCAN) 200 MG tablet Take 1 tablet (200 mg total) by mouth every 3 (three) days. 11/22/21   Shelly Bombard, MD  fluticasone-salmeterol (ADVAIR HFA) [phone] MCG/ACT inhaler Inhale 2 puffs into the lungs 2 (two) times daily. 05/29/21   Nafziger, Tommi Rumps, NP  gabapentin (NEURONTIN) 600 MG tablet Take 600 mg by mouth 3 (three) times daily. 05/18/21   [provider]  hydrochlorothiazide (HYDRODIURIL) 25 MG tablet Take 1 tablet (25 mg total) by mouth daily. 11/08/20   Nafziger, Tommi Rumps, NP  ibuprofen (ADVIL) 600 MG tablet Take 1 tablet (600 mg total) by mouth every 6 (six) hours as needed. 02/19/53   Lianne Cure, DO  levonorgestrel (KYLEENA) 19.5 MG IUD Kyleena 17.5 mcg/24 hrs (28yr) 19.'5mg'$  intrauterine device  Take 1 device by intrauterine route. Patient not taking: Reported on 03/27/2022    [provider]  loratadine (CLARITIN) 10 MG tablet Take 10 mg by mouth daily as needed for allergies.     [provider]  nortriptyline (PAMELOR) 25 MG capsule Take 1 capsule (25 mg total) by mouth at bedtime. 11/05/21   CGregor Hams MD  ondansetron (ZOFRAN-ODT) 8 MG disintegrating tablet ondansetron 8 mg disintegrating tablet  [provider]  oxyCODONE-acetaminophen (PERCOCET/ROXICET) 5-325 MG tablet Take 1 tablet by mouth 5 (five) times daily.    [provider]  pantoprazole (PROTONIX) 40 MG tablet Take 1 tablet (40 mg total) by mouth daily. 06/11/21   Maryjane Hurter, MD  polyethylene glycol powder (GLYCOLAX/MIRALAX) 17 GM/SCOOP powder Take 17 g by mouth daily as needed for moderate constipation.  10/11/19   [provider]  SUMAtriptan (IMITREX) 50 MG tablet Take 1 tablet (50 mg total) by mouth every 2 (two) hours as needed for migraine. May repeat in 2 hours if headache  persists or recurs. 08/08/21   Nafziger, Tommi Rumps, NP  tiZANidine (ZANAFLEX) 4 MG tablet Take 4 mg by mouth 3 (three) times daily as needed. 08/28/21   [provider]  topiramate (TOPAMAX) 50 MG tablet Take by mouth. 03/21/21   [provider]  Vitamin D, Ergocalciferol, (DRISDOL) 1.25 MG (50000 UNIT) CAPS capsule Take 1 capsule (50,000 Units total) by mouth every 7 (seven) days. Patient taking differently: Take 50,000 Units by mouth every Monday. 05/06/21   Laqueta Linden, MD      Allergies    Bee pollen, Bee venom, Hydrocodone-acetaminophen, Peach flavor, Pollen extract, Remdesivir, Cortisone, and Cortizone-10 [hydrocortisone]    Review of Systems   Review of Systems  Genitourinary:  Positive for vaginal bleeding.  All other systems reviewed and are negative.   Physical Exam Updated Vital Signs BP 119/87   Pulse 97   Temp 97.9 F (36.6 C) (Oral)   Resp 17   Ht 6' 2.5" (1.892 m)   Wt (!) 220 kg   LMP 02/12/2022   SpO2 97%   BMI 61.44 kg/m  Physical Exam Vitals and nursing note reviewed.  Constitutional:      General: She is not in acute distress.    Appearance: She is well-developed.  HENT:     Head: Normocephalic and atraumatic.  Eyes:     Conjunctiva/sclera: Conjunctivae normal.  Cardiovascular:     Rate and Rhythm: Normal rate and regular rhythm.     Heart sounds: No murmur heard. Pulmonary:     Effort: Pulmonary effort is normal. No respiratory distress.     Breath sounds: Normal breath sounds.  Abdominal:     Palpations: Abdomen is soft.     Tenderness: There is no abdominal tenderness.  Genitourinary:    Comments: Tech chaperone Small blood noted in vagina, no active bleeding noted, os closed, cervix normal Musculoskeletal:        General: No swelling.     Cervical back: Neck supple.  Skin:    General: Skin is warm and dry.     Capillary Refill: Capillary refill takes less than 2 seconds.  Neurological:     Mental Status: She is alert.   Psychiatric:        Mood and Affect: Mood normal.     ED Results / Procedures / Treatments   Labs (all labs ordered are listed, but only abnormal results are displayed) Labs Reviewed  COMPREHENSIVE METABOLIC PANEL - Abnormal; Notable for the following components:      Result Value   Total Protein 8.5 (*)    All other components within normal limits  CBC - Abnormal; Notable for the following components:   RBC 5.75 (*)    MCV 76.9 (*)    MCH 23.5 (*)    All other components within normal limits  URINALYSIS, ROUTINE W REFLEX MICROSCOPIC - Abnormal; Notable for the following components:  Hgb urine dipstick LARGE (*)    Protein, ur 30 (*)    Leukocytes,Ua SMALL (*)    All other components within normal limits  LIPASE, BLOOD  I-STAT BETA HCG BLOOD, ED ([facility], [facility], [facility] ONLY)    EKG None  Radiology No results found.  Procedures Procedures    Medications Ordered in ED Medications  lactated ringers bolus 1,000 mL (0 mLs Intravenous Stopped 04/25/22 0010)    ED Course/ Medical Decision Making/ A&P                           Medical Decision Making Amount and/or Complexity of Data Reviewed Labs: ordered.   26 year old presents to ER for vaginal bleeding.  On exam she is well-appearing in no distress.  Abdomen soft and nontender.  Patient denies any ongoing systemic symptoms.  Her hemoglobin is normal.  Checked brief pelvic exam, small blood in vaginal vault noted but no active bleeding noted.  Patient reports that she had an IUD placed recently.  Given her hemoglobin levels today and her well appearance, relatively recent IUD placement, will defer initiating any new medications at this time to her OB/GYN team.  I encourage patient to call her OB/GYN office tomorrow morning to get a close follow-up appointment to discuss ongoing management and her symptoms from today.  Given her normal hemoglobin well appearance without ongoing systemic symptoms, feel she is stable for discharge and  outpatient management and does not require admission at this time.    After the discussed management above, the patient was determined to be safe for discharge.  The patient was in agreement with this plan and all questions regarding their care were answered.  ED return precautions were discussed and the patient will return to the ED with any significant worsening of condition.         Final Clinical Impression(s) / ED Diagnoses Final diagnoses:  Vaginal bleeding    Rx / DC Orders ED Discharge Orders     None         Lucrezia Starch, MD 04/25/22 1529

## 2022-04-24 NOTE — Discharge Instructions (Signed)
Please follow-up with your gynecologist to discuss your symptoms further.  If you are having increasing bleeding, any lightheadedness, chest pain, shortness of breath or other new concerning symptoms, please return to the emergency room for reassessment.

## 2022-04-30 DIAGNOSIS — E282 Polycystic ovarian syndrome: Secondary | ICD-10-CM | POA: Diagnosis not present

## 2022-04-30 DIAGNOSIS — Z9989 Dependence on other enabling machines and devices: Secondary | ICD-10-CM | POA: Diagnosis not present

## 2022-04-30 DIAGNOSIS — E119 Type 2 diabetes mellitus without complications: Secondary | ICD-10-CM | POA: Diagnosis not present

## 2022-04-30 DIAGNOSIS — G4733 Obstructive sleep apnea (adult) (pediatric): Secondary | ICD-10-CM | POA: Diagnosis not present

## 2022-04-30 DIAGNOSIS — I10 Essential (primary) hypertension: Secondary | ICD-10-CM | POA: Diagnosis not present

## 2022-04-30 DIAGNOSIS — Z6841 Body Mass Index (BMI) 40.0 and over, adult: Secondary | ICD-10-CM | POA: Diagnosis not present

## 2022-05-01 ENCOUNTER — Other Ambulatory Visit: Payer: Self-pay

## 2022-05-01 ENCOUNTER — Emergency Department (HOSPITAL_COMMUNITY)
Admission: EM | Admit: 2022-05-01 | Discharge: 2022-05-01 | Disposition: A | Discharge status: Home / Self Care | Payer: Medicaid Other | Attending: Physician Assistant | Admitting: Physician Assistant

## 2022-05-01 ENCOUNTER — Encounter (HOSPITAL_COMMUNITY): Payer: Self-pay

## 2022-05-01 DIAGNOSIS — N939 Abnormal uterine and vaginal bleeding, unspecified: Secondary | ICD-10-CM | POA: Insufficient documentation

## 2022-05-01 DIAGNOSIS — Z30431 Encounter for routine checking of intrauterine contraceptive device: Secondary | ICD-10-CM | POA: Diagnosis not present

## 2022-05-01 DIAGNOSIS — R42 Dizziness and giddiness: Secondary | ICD-10-CM | POA: Diagnosis not present

## 2022-05-01 DIAGNOSIS — Z5321 Procedure and treatment not carried out due to patient leaving prior to being seen by health care provider: Secondary | ICD-10-CM | POA: Insufficient documentation

## 2022-05-01 LAB — CBC WITH DIFFERENTIAL/PLATELET
Abs Immature Granulocytes: 0.02 10*3/uL (ref 0.00–0.07)
Basophils Absolute: 0 10*3/uL (ref 0.0–0.1)
Basophils Relative: 0 %
Eosinophils Absolute: 0 10*3/uL (ref 0.0–0.5)
Eosinophils Relative: 1 %
HCT: 40.7 % (ref 36.0–46.0)
Hemoglobin: 12.7 g/dL (ref 12.0–15.0)
Immature Granulocytes: 0 %
Lymphocytes Relative: 30 %
Lymphs Abs: 2.5 10*3/uL (ref 0.7–4.0)
MCH: 24.1 pg — ABNORMAL LOW (ref 26.0–34.0)
MCHC: 31.2 g/dL (ref 30.0–36.0)
MCV: 77.2 fL — ABNORMAL LOW (ref 80.0–100.0)
Monocytes Absolute: 0.6 10*3/uL (ref 0.1–1.0)
Monocytes Relative: 8 %
Neutro Abs: 5.1 10*3/uL (ref 1.7–7.7)
Neutrophils Relative %: 61 %
Platelets: 268 10*3/uL (ref 150–400)
RBC: 5.27 MIL/uL — ABNORMAL HIGH (ref 3.87–5.11)
RDW: 14.9 % (ref 11.5–15.5)
WBC: 8.3 10*3/uL (ref 4.0–10.5)
nRBC: 0 % (ref 0.0–0.2)

## 2022-05-01 LAB — URINALYSIS, ROUTINE W REFLEX MICROSCOPIC
Bacteria, UA: NONE SEEN
Bilirubin Urine: NEGATIVE
Glucose, UA: NEGATIVE mg/dL
Ketones, ur: NEGATIVE mg/dL
Nitrite: NEGATIVE
Protein, ur: NEGATIVE mg/dL
RBC / HPF: >50 RBC/hpf — ABNORMAL HIGH (ref 0–5)
Specific Gravity, Urine: 1.023 (ref 1.005–1.030)
pH: 6 (ref 5.0–8.0)

## 2022-05-01 LAB — BASIC METABOLIC PANEL
Anion gap: 7 (ref 5–15)
BUN: 9 mg/dL (ref 6–20)
CO2: 25 mmol/L (ref 22–32)
Calcium: 8.9 mg/dL (ref 8.9–10.3)
Chloride: 108 mmol/L (ref 98–111)
Creatinine, Ser: 0.61 mg/dL (ref 0.44–1.00)
GFR, Estimated: >60 mL/min (ref >60)
Glucose, Bld: 98 mg/dL (ref 70–99)
Potassium: 4.1 mmol/L (ref 3.5–5.1)
Sodium: 140 mmol/L (ref 135–145)

## 2022-05-01 LAB — I-STAT BETA HCG BLOOD, ED (MC, WL, AP ONLY): I-stat hCG, quantitative: <5 m[IU]/mL (ref <5)

## 2022-05-01 NOTE — ED Triage Notes (Signed)
Pt presents to ED from home with c/o vaginal bleeding ongoing x 20 days. Pt seen on 07/06, states bleeding never resolved. Pt bleeding through overnight pad and one depend every hour. Endorses pelvic pain and lightheadedness.

## 2022-05-01 NOTE — ED Provider Triage Note (Signed)
Emergency Medicine Provider Triage Evaluation Note  Maureen Lewis , a 26 y.o. female  was evaluated in triage.  Pt complains of vaginal bleeding.  Has been bleeding for essentially a month straight.  This started after having IUD placed.  States she is changing her pad about once an hour.  Having clots the size of half dollar coins per patient.  Having some lightheadedness.  Was seen by OB/GYN today.  Patient states she has not gotten answers which letter here today.  Review of Systems  Positive: Vaginal bleeding Negative:   Physical Exam  BP (!) 152/116 (BP Location: Left Arm)   Pulse 98   Temp 98.4 F (36.9 C) (Oral)   Resp 20   Ht '6\' 1"'$  (1.854 m)   Wt (!) 219.1 kg   SpO2 98%   BMI 63.72 kg/m  Gen:   Awake, no distress   Resp:  Normal effort  MSK:   Moves extremities without difficulty  Other:    Medical Decision Making  Medically screening exam initiated at 8:10 PM.  Appropriate orders placed.  Oaklynn Nandia Renee Sayer was informed that the remainder of the evaluation will be completed by another provider, this initial triage assessment does not replace that evaluation, and the importance of remaining in the ED until their evaluation is complete.  Dysfunctional uterine bleeding   Jerilynn Feldmeier A, PA-C 05/01/22 2011

## 2022-05-02 ENCOUNTER — Ambulatory Visit: Payer: Medicaid Other | Admitting: Adult Health

## 2022-05-02 ENCOUNTER — Encounter: Payer: Self-pay | Admitting: Adult Health

## 2022-05-02 VITALS — HR 99 | Temp 98.0°F | Ht 73.0 in | Wt >= 6400 oz

## 2022-05-02 DIAGNOSIS — M79641 Pain in right hand: Secondary | ICD-10-CM | POA: Diagnosis not present

## 2022-05-02 DIAGNOSIS — R635 Abnormal weight gain: Secondary | ICD-10-CM | POA: Diagnosis not present

## 2022-05-02 DIAGNOSIS — M546 Pain in thoracic spine: Secondary | ICD-10-CM | POA: Diagnosis not present

## 2022-05-02 DIAGNOSIS — M545 Low back pain, unspecified: Secondary | ICD-10-CM | POA: Diagnosis not present

## 2022-05-02 DIAGNOSIS — M79642 Pain in left hand: Secondary | ICD-10-CM | POA: Diagnosis not present

## 2022-05-02 DIAGNOSIS — M25571 Pain in right ankle and joints of right foot: Secondary | ICD-10-CM | POA: Diagnosis not present

## 2022-05-02 DIAGNOSIS — M25551 Pain in right hip: Secondary | ICD-10-CM | POA: Diagnosis not present

## 2022-05-02 DIAGNOSIS — Z79899 Other long term (current) drug therapy: Secondary | ICD-10-CM | POA: Diagnosis not present

## 2022-05-02 DIAGNOSIS — N915 Oligomenorrhea, unspecified: Secondary | ICD-10-CM | POA: Diagnosis not present

## 2022-05-02 DIAGNOSIS — N939 Abnormal uterine and vaginal bleeding, unspecified: Secondary | ICD-10-CM | POA: Diagnosis not present

## 2022-05-02 DIAGNOSIS — M419 Scoliosis, unspecified: Secondary | ICD-10-CM | POA: Diagnosis not present

## 2022-05-02 DIAGNOSIS — M542 Cervicalgia: Secondary | ICD-10-CM | POA: Diagnosis not present

## 2022-05-02 NOTE — Progress Notes (Signed)
Subjective:    Patient ID: Maureen Lewis, female    DOB: 05-08-1996, 26 y.o.   MRN: [id number]  HPI 26 year old female who  has a past medical history of Abnormal uterine bleeding, Acid reflux, Alpha thalassemia trait, Amenorrhea, Anemia, Asthma, Back pain, Cesarean delivery delivered (10/11/2019), Chest pain, Complication of anesthesia, Constipation, Depression, Fibromyalgia, Finger mass, left (03/2017), Gestational diabetes (05/23/2019), Gestational hypertension (09/23/2019), Headache, Hypertension, Infection, Irritable bowel syndrome (IBS), Joint pain, Morbid obesity with body mass index (BMI) of 45.0 to 49.9 in adult [facility]), Obesity during pregnancy, antepartum (04/06/2019), Plantar fasciitis, bilateral, Polycystic ovary syndrome, Pre-diabetes, Prediabetes, Pregnancy induced hypertension, Shortness of breath, Sleep apnea, Supervision of normal first pregnancy (04/06/2019), and Vertigo (2017).  Presents to the office today for follow-up after being seen in the emergency room.  She was originally seen in the ER 8 days ago for concern of vaginal bleeding.  At this time she reported that she had bleeding for the 2 weeks prior and was going through a couple pads a day.  She did have an IUD placed a couple months ago.  She went to her GYN yesterday and per notes IUD was in place on ultrasound and reading was noted but not heavy.  Patient reports that she would like a new GYN as she feels as though her current GYN "does not listen to me and will not take out my IUD".  Bleeding has gone on for 20 days straight.   Review of Systems See HPI   Past Medical History:  Diagnosis Date   Abnormal uterine bleeding    Acid reflux    Alpha thalassemia trait    Amenorrhea    Anemia    no current med.   Asthma    Back pain    Cesarean delivery delivered 10/11/2019   10/09/2019 - primary CS for failed IOL   Chest pain    resolved   Complication of anesthesia    states had to keep giving  her anesthesia during EGD   Constipation    Depression    "I'm good"   Fibromyalgia    Finger mass, left 03/2017   middle finger   Gestational diabetes 05/23/2019   hx - with pregnancy only    Gestational hypertension 09/23/2019   Guidelines for Antenatal Testing and Sonography  (with updated ICD-10 codes)  Updated  14-Sep-2019 with Dr. Tama High  INDICATION U/S 2 X week NST/AFI  or full BPP wkly DELIVERY Diabetes   A1 - good control - O24.410    A2 - good control - O24.419      A2  - poor control or poor compliance - O24.419, E11.65   (Macrosomia or polyhydramnios) **E11.65 is extra code for poor control**    A2/B - O24.   Headache    Hypertension    Infection    UTI   Irritable bowel syndrome (IBS)    Joint pain    Morbid obesity with body mass index (BMI) of 45.0 to 49.9 in adult [facility])    Obesity during pregnancy, antepartum 04/06/2019   Body mass index is 53.71 kg/m.  Recommendations '[x]'$  Aspirin 81 mg daily after 12 weeks; discontinue after 36 weeks '[ ]'$  Nutrition consult '[ ]'$  Weight gain 11-20 lbs for singleton and 25-35 lbs for twin pregnancy (IOM guidelines) Higher class of obesity patients recommended to gain closer to lower limit  Weight loss is associated with adverse outcomes '[ ]'$  Baseline and surveillance labs (pulled in fr  Plantar fasciitis, bilateral    resolved   Polycystic ovary syndrome    Pre-diabetes    no meds, diet controlled, diet not check blood sugar   Prediabetes    "prediabetes"   Pregnancy induced hypertension    Shortness of breath    albuterol inhaler   Sleep apnea    no CPAP use   Supervision of normal first pregnancy 04/06/2019   [name]: [ x]Initial [ x]12 '[ ]'$ 20 '[ ]'$ 28 '[ ]'$ 32 '[ ]'$ 36 '[ ]'$ 38 '[ ]'$ 39 '[ ]'$ 40 Nursing Staff Provider Office Location [facility]  Dating   LMP Language  English  Anatomy US  Nml Flu Vaccine  Declined 07/28/19 Genetic Screen  NIPS: low risks   AFP:   negative  TDaP vaccine   08/25/19 Hgb A1C or  GTT Early A1C 5.8 Third  trimester: GDM insulin Rhogam  NA   LAB RESULTS  Feeding Plan Breast Blood Type   Vertigo 2017    Social History   Socioeconomic History   Marital status: Single    Spouse name: Not on file   Number of children: Not on file   Years of education: Not on file   Highest education level: Not on file  Occupational History   Occupation: Chemical engineer    Employer: [id number]  Tobacco Use   Smoking status: Never   Smokeless tobacco: Never  Vaping Use   Vaping Use: Never used  Substance and Sexual Activity   Alcohol use: Not Currently    Comment: social    Drug use: No   Sexual activity: Yes    Birth control/protection: I.U.D.  Other Topics Concern   Not on file  Social History Narrative   Right handed    Soda sometimes   Lives with mom and cousin, a grandmother in [place] also helps care for her.       She is in nursing school.    Social Determinants of Health   Financial Resource Strain: Not on file  Food Insecurity: Not on file  Transportation Needs: Not on file  Physical Activity: Not on file  Stress: Not on file  Social Connections: Not on file  Intimate Partner Violence: Not on file    Past Surgical History:  Procedure Laterality Date   CESAREAN SECTION N/A 10/09/2019   Procedure: CESAREAN SECTION;  Surgeon: Aletha Halim, MD;  Location: [facility];  Service: Obstetrics;  Laterality: N/A;   COLONOSCOPY WITH PROPOFOL  10/07/2016   DILATION AND CURETTAGE OF UTERUS     ESOPHAGOGASTRODUODENOSCOPY  12/21/2015   EXCISION MASS UPPER EXTREMETIES Left 04/21/2017   Procedure: EXCISION MASS LEFT MIDDLE FINGER;  Surgeon: Daryll Brod, MD;  Location: [place];  Service: Orthopedics;  Laterality: Left;   IR FL GUIDED LOC OF NEEDLE/CATH TIP FOR SPINAL INJECTION RT  03/13/2020   PHOTOCOAGULATION WITH LASER Left 12/13/2020   Procedure: RETINOPLEXY LEFT EYE;  Surgeon: Sherlynn Stalls, MD;  Location: [place];  Service: Ophthalmology;  Laterality: Left;   SCLERAL  BUCKLE Right 12/13/2020   Procedure: SCLERAL BUCKLE WITH CRYO;  Surgeon: Sherlynn Stalls, MD;  Location: [place];  Service: Ophthalmology;  Laterality: Right;    Family History  Problem Relation Age of Onset   Diabetes Maternal Aunt    Hypertension Maternal Uncle    Depression Maternal Grandfather    Diabetes Maternal Grandfather    Hypertension Maternal Grandfather    Arthritis Mother    High blood pressure Mother    Depression Mother  Sleep apnea Mother    Obesity Mother    Diabetes Mother    Hypertension Mother    Kidney failure Mother    Sleep apnea Father    Obesity Father    Healthy Daughter     Allergies  Allergen Reactions   Bee Pollen Hives, Shortness Of Breath and Swelling   Bee Venom Hives, Shortness Of Breath and Swelling   Hydrocodone-Acetaminophen Anaphylaxis    No problem when she takes Tylenol   Peach Flavor Hives, Shortness Of Breath and Other (See Comments)    SWELLING OF MOUTH   Pollen Extract Hives, Shortness Of Breath and Swelling   Remdesivir Swelling    Angioedema 8/25   Latex Rash   Cortisone Itching and Swelling   Cortizone-10 [Hydrocortisone]     Swelling and itching    Current Outpatient Medications on File Prior to Visit  Medication Sig Dispense Refill   acetaZOLAMIDE ER (DIAMOX) 500 MG capsule Take by mouth.     amLODipine (NORVASC) 10 MG tablet Take 1 tablet (10 mg total) by mouth daily. 90 tablet 3   benzonatate (TESSALON) 200 MG capsule Take by mouth.     Blood Pressure Monitoring (BLOOD PRESSURE MON/AUTO/WRIST) DEVI Use to monitor blood pressure 1 each 0   carvedilol (COREG) 3.125 MG tablet Take 3.125 mg by mouth 2 (two) times daily.     chlorhexidine (PERIDEX) 0.12 % solution SMARTSIG:By Mouth     diclofenac (VOLTAREN) 75 MG EC tablet Take 1 tablet (75 mg total) by mouth 2 (two) times daily. (Patient taking differently: Take 75 mg by mouth 2 (two) times daily as needed (pain).) 60 tablet 1   dicyclomine (BENTYL) 10 MG capsule  dicyclomine 10 mg capsule     Diethylpropion HCl CR 75 MG TB24 Take by mouth.     Dulaglutide (TRULICITY) 4.5 NT/6.1WE SOPN Inject 4.5 mg into the skin once a week. 6 mL 1   fluconazole (DIFLUCAN) 200 MG tablet Take 1 tablet (200 mg total) by mouth every 3 (three) days. 3 tablet 2   fluticasone-salmeterol (ADVAIR HFA) 115-21 MCG/ACT inhaler Inhale 2 puffs into the lungs 2 (two) times daily. 3 each 3   gabapentin (NEURONTIN) 600 MG tablet Take 600 mg by mouth 3 (three) times daily.     hydrochlorothiazide (HYDRODIURIL) 25 MG tablet Take 1 tablet (25 mg total) by mouth daily. 90 tablet 3   ibuprofen (ADVIL) 600 MG tablet Take 1 tablet (600 mg total) by mouth every 6 (six) hours as needed. 30 tablet 0   levonorgestrel (KYLEENA) 19.5 MG IUD      loratadine (CLARITIN) 10 MG tablet Take 10 mg by mouth daily as needed for allergies.      nortriptyline (PAMELOR) 25 MG capsule Take 1 capsule (25 mg total) by mouth at bedtime. 30 capsule 2   ondansetron (ZOFRAN-ODT) 8 MG disintegrating tablet ondansetron 8 mg disintegrating tablet     oxyCODONE-acetaminophen (PERCOCET/ROXICET) 5-325 MG tablet Take 1 tablet by mouth 5 (five) times daily.     pantoprazole (PROTONIX) 40 MG tablet Take 1 tablet (40 mg total) by mouth daily. 90 tablet 0   polyethylene glycol powder (GLYCOLAX/MIRALAX) 17 GM/SCOOP powder Take 17 g by mouth daily as needed for moderate constipation.      SUMAtriptan (IMITREX) 50 MG tablet Take 1 tablet (50 mg total) by mouth every 2 (two) hours as needed for migraine. May repeat in 2 hours if headache persists or recurs. 10 tablet 1   tiZANidine (ZANAFLEX) 4 MG  tablet Take 4 mg by mouth 3 (three) times daily as needed.     topiramate (TOPAMAX) 50 MG tablet Take by mouth.     Vitamin D, Ergocalciferol, (DRISDOL) 1.25 MG (50000 UNIT) CAPS capsule Take 1 capsule (50,000 Units total) by mouth every 7 (seven) days. (Patient taking differently: Take 50,000 Units by mouth every Monday.) 4 capsule 0    ferrous sulfate 325 (65 FE) MG tablet Take 1 tablet (325 mg total) by mouth every other day. 45 tablet 1   No current facility-administered medications on file prior to visit.    Pulse 99   Temp 98 F (36.7 C) (Oral)   Ht '6\' 1"'$  (1.854 m)   Wt (!) 487 lb (220.9 kg)   SpO2 100%   BMI 64.25 kg/m       Objective:   Physical Exam Vitals and nursing note reviewed.  Constitutional:      Appearance: Normal appearance.  Cardiovascular:     Rate and Rhythm: Normal rate and regular rhythm.     Pulses: Normal pulses.     Heart sounds: Normal heart sounds.  Pulmonary:     Effort: Pulmonary effort is normal.     Breath sounds: Normal breath sounds.  Abdominal:     General: Abdomen is flat. Bowel sounds are normal.     Palpations: Abdomen is soft.  Skin:    General: Skin is warm and dry.  Neurological:     General: No focal deficit present.     Mental Status: She is alert and oriented to person, place, and time.  Psychiatric:        Mood and Affect: Mood normal.        Behavior: Behavior normal.        Thought Content: Thought content normal.        Judgment: Judgment normal.       Assessment & Plan:  1. Abnormal vaginal bleeding She was given the phone number for [facility] and she will call and schedule an appointment   Dorothyann Peng, NP

## 2022-05-06 DIAGNOSIS — Z79899 Other long term (current) drug therapy: Secondary | ICD-10-CM | POA: Diagnosis not present

## 2022-05-07 DIAGNOSIS — M67962 Unspecified disorder of synovium and tendon, left lower leg: Secondary | ICD-10-CM | POA: Diagnosis not present

## 2022-05-07 DIAGNOSIS — S83282A Other tear of lateral meniscus, current injury, left knee, initial encounter: Secondary | ICD-10-CM | POA: Diagnosis not present

## 2022-05-07 DIAGNOSIS — R600 Localized edema: Secondary | ICD-10-CM | POA: Diagnosis not present

## 2022-05-07 DIAGNOSIS — M67952 Unspecified disorder of synovium and tendon, left thigh: Secondary | ICD-10-CM | POA: Diagnosis not present

## 2022-05-08 DIAGNOSIS — M25562 Pain in left knee: Secondary | ICD-10-CM | POA: Diagnosis not present

## 2022-05-09 HISTORY — PX: OTHER SURGICAL HISTORY: SHX169

## 2022-05-16 ENCOUNTER — Encounter: Payer: Self-pay | Admitting: Adult Health

## 2022-05-16 NOTE — Telephone Encounter (Signed)
Advised pt that I will send to [facility] for advise.

## 2022-05-19 DIAGNOSIS — I272 Pulmonary hypertension, unspecified: Secondary | ICD-10-CM | POA: Diagnosis not present

## 2022-05-19 DIAGNOSIS — E282 Polycystic ovarian syndrome: Secondary | ICD-10-CM | POA: Diagnosis not present

## 2022-05-19 DIAGNOSIS — G8918 Other acute postprocedural pain: Secondary | ICD-10-CM | POA: Diagnosis not present

## 2022-05-19 DIAGNOSIS — I10 Essential (primary) hypertension: Secondary | ICD-10-CM | POA: Diagnosis not present

## 2022-05-19 DIAGNOSIS — R160 Hepatomegaly, not elsewhere classified: Secondary | ICD-10-CM | POA: Diagnosis not present

## 2022-05-19 DIAGNOSIS — E119 Type 2 diabetes mellitus without complications: Secondary | ICD-10-CM | POA: Diagnosis not present

## 2022-05-19 DIAGNOSIS — Z9989 Dependence on other enabling machines and devices: Secondary | ICD-10-CM | POA: Diagnosis not present

## 2022-05-19 DIAGNOSIS — J45909 Unspecified asthma, uncomplicated: Secondary | ICD-10-CM | POA: Diagnosis not present

## 2022-05-19 DIAGNOSIS — Z6841 Body Mass Index (BMI) 40.0 and over, adult: Secondary | ICD-10-CM | POA: Diagnosis not present

## 2022-05-19 DIAGNOSIS — G4733 Obstructive sleep apnea (adult) (pediatric): Secondary | ICD-10-CM | POA: Diagnosis not present

## 2022-05-19 DIAGNOSIS — R0602 Shortness of breath: Secondary | ICD-10-CM | POA: Diagnosis not present

## 2022-05-20 DIAGNOSIS — Z9884 Bariatric surgery status: Secondary | ICD-10-CM | POA: Insufficient documentation

## 2022-05-20 DIAGNOSIS — Z6841 Body Mass Index (BMI) 40.0 and over, adult: Secondary | ICD-10-CM | POA: Diagnosis not present

## 2022-05-20 DIAGNOSIS — N939 Abnormal uterine and vaginal bleeding, unspecified: Secondary | ICD-10-CM | POA: Diagnosis not present

## 2022-05-20 DIAGNOSIS — E119 Type 2 diabetes mellitus without complications: Secondary | ICD-10-CM | POA: Diagnosis not present

## 2022-05-21 ENCOUNTER — Ambulatory Visit: Payer: Medicaid Other | Admitting: Obstetrics and Gynecology

## 2022-05-23 ENCOUNTER — Ambulatory Visit: Payer: Medicaid Other | Admitting: Adult Health

## 2022-05-23 ENCOUNTER — Encounter: Payer: Self-pay | Admitting: Adult Health

## 2022-05-23 VITALS — BP 120/80 | HR 80 | Temp 98.8°F | Ht 73.0 in | Wt >= 6400 oz

## 2022-05-23 DIAGNOSIS — Z903 Acquired absence of stomach [part of]: Secondary | ICD-10-CM

## 2022-05-23 NOTE — Progress Notes (Signed)
Subjective:    Patient ID: Maureen Lewis, female    DOB: [birth year], 26 y.o.   MRN: [id number]  HPI 26 year old female who  has a past medical history of Abnormal uterine bleeding, Acid reflux, Alpha thalassemia trait, Amenorrhea, Anemia, Asthma, Back pain, Cesarean delivery delivered (10/11/2019), Chest pain, Complication of anesthesia, Constipation, Depression, Fibromyalgia, Finger mass, left (03/2017), Gestational diabetes (05/23/2019), Gestational hypertension (09/23/2019), Headache, Hypertension, Infection, Irritable bowel syndrome (IBS), Joint pain, Morbid obesity with body mass index (BMI) of 45.0 to 49.9 in adult [facility]), Obesity during pregnancy, antepartum (04/06/2019), Plantar fasciitis, bilateral, Polycystic ovary syndrome, Pre-diabetes, Prediabetes, Pregnancy induced hypertension, Shortness of breath, Sleep apnea, Supervision of normal first pregnancy (04/06/2019), and Vertigo (2017).  She presents to the office today for post surgical follow up.  She had a laparoscopic vertical sleeve gastrectomy on 05/19/2022 at [place]. She has been recovering well and is glad that she went through with the surgery.  She does have some pain with eating and drinking still and is trying to stay hydrated.   She has a follow up appointment on August 14th.   Wt Readings from Last 3 Encounters:  05/23/22 (!) 466 lb (211.4 kg)  05/02/22 (!) 487 lb (220.9 kg)  05/01/22 (!) 483 lb (219.1 kg)   She has no acute issues   Review of Systems See HPI   Past Medical History:  Diagnosis Date   Abnormal uterine bleeding    Acid reflux    Alpha thalassemia trait    Amenorrhea    Anemia    no current med.   Asthma    Back pain    Cesarean delivery delivered 10/11/2019   10/09/2019 - primary CS for failed IOL   Chest pain    resolved   Complication of anesthesia    states had to keep giving her anesthesia during EGD   Constipation    Depression    "I'm good"   Fibromyalgia     Finger mass, left 03/2017   middle finger   Gestational diabetes 05/23/2019   hx - with pregnancy only    Gestational hypertension 09/23/2019   Guidelines for Antenatal Testing and Sonography  (with updated ICD-10 codes)  Updated  10/03/2019 with Dr. Tama High  INDICATION U/S 2 X week NST/AFI  or full BPP wkly DELIVERY Diabetes   A1 - good control - O24.410    A2 - good control - O24.419      A2  - poor control or poor compliance - O24.419, E11.65   (Macrosomia or polyhydramnios) **E11.65 is extra code for poor control**    A2/B - O24.   Headache    Hypertension    Infection    UTI   Irritable bowel syndrome (IBS)    Joint pain    Morbid obesity with body mass index (BMI) of 45.0 to 49.9 in adult [facility])    Obesity during pregnancy, antepartum 04/06/2019   Body mass index is 53.71 kg/m.  Recommendations '[x]'$  Aspirin 81 mg daily after 12 weeks; discontinue after 36 weeks '[ ]'$  Nutrition consult '[ ]'$  Weight gain 11-20 lbs for singleton and 25-35 lbs for twin pregnancy (IOM guidelines) Higher class of obesity patients recommended to gain closer to lower limit  Weight loss is associated with adverse outcomes '[ ]'$  Baseline and surveillance labs (pulled in fr   Plantar fasciitis, bilateral    resolved   Polycystic ovary syndrome    Pre-diabetes    no meds, diet  controlled, diet not check blood sugar   Prediabetes    "prediabetes"   Pregnancy induced hypertension    Shortness of breath    albuterol inhaler   Sleep apnea    no CPAP use   Supervision of normal first pregnancy 04/06/2019   [name]: [ x]Initial [ x]12 '[ ]'$ 20 '[ ]'$ 28 '[ ]'$ 32 '[ ]'$ 36 '[ ]'$ 38 '[ ]'$ 39 '[ ]'$ 40 Nursing Staff Provider Office Location [facility]  Dating   LMP Language  English  Anatomy US  Nml Flu Vaccine  Declined 07/28/19 Genetic Screen  NIPS: low risks   AFP:   negative  TDaP vaccine   08/25/19 Hgb A1C or  GTT Early A1C 5.8 Third trimester: GDM insulin Rhogam  NA   LAB RESULTS  Feeding Plan Breast Blood Type   Vertigo 2017     Social History   Socioeconomic History   Marital status: Single    Spouse name: Not on file   Number of children: Not on file   Years of education: Not on file   Highest education level: Not on file  Occupational History   Occupation: Chemical engineer    Employer: [employer]  Tobacco Use   Smoking status: Never   Smokeless tobacco: Never  Vaping Use   Vaping Use: Never used  Substance and Sexual Activity   Alcohol use: Not Currently    Comment: social    Drug use: No   Sexual activity: Yes    Birth control/protection: I.U.D.  Other Topics Concern   Not on file  Social History Narrative   Right handed    Soda sometimes   Lives with mom and cousin, a grandmother in [place] also helps care for her.       She is in nursing school.    Social Determinants of Health   Financial Resource Strain: Not on file  Food Insecurity: Not on file  Transportation Needs: Not on file  Physical Activity: Not on file  Stress: Not on file  Social Connections: Not on file  Intimate Partner Violence: Not on file    Past Surgical History:  Procedure Laterality Date   CESAREAN SECTION N/A 10/09/2019   Procedure: CESAREAN SECTION;  Surgeon: Aletha Halim, MD;  Location: [facility];  Service: Obstetrics;  Laterality: N/A;   COLONOSCOPY WITH PROPOFOL  10/07/2016   DILATION AND CURETTAGE OF UTERUS     ESOPHAGOGASTRODUODENOSCOPY  12/21/2015   EXCISION MASS UPPER EXTREMETIES Left 04/21/2017   Procedure: EXCISION MASS LEFT MIDDLE FINGER;  Surgeon: Daryll Brod, MD;  Location: [place];  Service: Orthopedics;  Laterality: Left;   IR FL GUIDED LOC OF NEEDLE/CATH TIP FOR SPINAL INJECTION RT  03/13/2020   PHOTOCOAGULATION WITH LASER Left 12/13/2020   Procedure: RETINOPLEXY LEFT EYE;  Surgeon: Sherlynn Stalls, MD;  Location: [place];  Service: Ophthalmology;  Laterality: Left;   SCLERAL BUCKLE Right 12/13/2020   Procedure: SCLERAL BUCKLE WITH CRYO;  Surgeon: Sherlynn Stalls, MD;   Location: [place];  Service: Ophthalmology;  Laterality: Right;    Family History  Problem Relation Age of Onset   Diabetes Maternal Aunt    Hypertension Maternal Uncle    Depression Maternal Grandfather    Diabetes Maternal Grandfather    Hypertension Maternal Grandfather    Arthritis Mother    High blood pressure Mother    Depression Mother    Sleep apnea Mother    Obesity Mother    Diabetes Mother    Hypertension Mother    Kidney  failure Mother    Sleep apnea Father    Obesity Father    Healthy Daughter     Allergies  Allergen Reactions   Bee Pollen Hives, Shortness Of Breath and Swelling   Bee Venom Hives, Shortness Of Breath and Swelling   Hydrocodone-Acetaminophen Anaphylaxis    No problem when she takes Tylenol   Peach Flavor Hives, Shortness Of Breath and Other (See Comments)    SWELLING OF MOUTH   Pollen Extract Hives, Shortness Of Breath and Swelling   Remdesivir Swelling    Angioedema 8/25   Latex Rash   Cortisone Itching and Swelling   Cortizone-10 [Hydrocortisone]     Swelling and itching    Current Outpatient Medications on File Prior to Visit  Medication Sig Dispense Refill   acetaZOLAMIDE ER (DIAMOX) 500 MG capsule Take by mouth.     amLODipine (NORVASC) 10 MG tablet Take 1 tablet (10 mg total) by mouth daily. 90 tablet 3   benzonatate (TESSALON) 200 MG capsule Take by mouth.     Blood Pressure Monitoring (BLOOD PRESSURE MON/AUTO/WRIST) DEVI Use to monitor blood pressure 1 each 0   carvedilol (COREG) 3.125 MG tablet Take 3.125 mg by mouth 2 (two) times daily.     chlorhexidine (PERIDEX) 0.12 % solution SMARTSIG:By Mouth     diclofenac (VOLTAREN) 75 MG EC tablet Take 1 tablet (75 mg total) by mouth 2 (two) times daily. (Patient taking differently: Take 75 mg by mouth 2 (two) times daily as needed (pain).) 60 tablet 1   dicyclomine (BENTYL) 10 MG capsule dicyclomine 10 mg capsule     Diethylpropion HCl CR 75 MG TB24 Take by mouth.     Dulaglutide  (TRULICITY) 4.5 VO/1.6WV SOPN Inject 4.5 mg into the skin once a week. 6 mL 1   fluconazole (DIFLUCAN) 200 MG tablet Take 1 tablet (200 mg total) by mouth every 3 (three) days. 3 tablet 2   fluticasone-salmeterol (ADVAIR HFA) 115-21 MCG/ACT inhaler Inhale 2 puffs into the lungs 2 (two) times daily. 3 each 3   gabapentin (NEURONTIN) 600 MG tablet Take 600 mg by mouth 3 (three) times daily.     hydrochlorothiazide (HYDRODIURIL) 25 MG tablet Take 1 tablet (25 mg total) by mouth daily. 90 tablet 3   ibuprofen (ADVIL) 600 MG tablet Take 1 tablet (600 mg total) by mouth every 6 (six) hours as needed. 30 tablet 0   levonorgestrel (KYLEENA) 19.5 MG IUD      loratadine (CLARITIN) 10 MG tablet Take 10 mg by mouth daily as needed for allergies.      nortriptyline (PAMELOR) 25 MG capsule Take 1 capsule (25 mg total) by mouth at bedtime. 30 capsule 2   ondansetron (ZOFRAN-ODT) 8 MG disintegrating tablet ondansetron 8 mg disintegrating tablet     oxyCODONE-acetaminophen (PERCOCET/ROXICET) 5-325 MG tablet Take 1 tablet by mouth 5 (five) times daily.     pantoprazole (PROTONIX) 40 MG tablet Take 1 tablet (40 mg total) by mouth daily. 90 tablet 0   polyethylene glycol powder (GLYCOLAX/MIRALAX) 17 GM/SCOOP powder Take 17 g by mouth daily as needed for moderate constipation.      SUMAtriptan (IMITREX) 50 MG tablet Take 1 tablet (50 mg total) by mouth every 2 (two) hours as needed for migraine. May repeat in 2 hours if headache persists or recurs. 10 tablet 1   tiZANidine (ZANAFLEX) 4 MG tablet Take 4 mg by mouth 3 (three) times daily as needed.     topiramate (TOPAMAX) 50 MG tablet Take  by mouth.     Vitamin D, Ergocalciferol, (DRISDOL) 1.25 MG (50000 UNIT) CAPS capsule Take 1 capsule (50,000 Units total) by mouth every 7 (seven) days. (Patient taking differently: Take 50,000 Units by mouth every Monday.) 4 capsule 0   ferrous sulfate 325 (65 FE) MG tablet Take 1 tablet (325 mg total) by mouth every other day. 45  tablet 1   No current facility-administered medications on file prior to visit.    BP 120/80   Pulse 80   Temp 98.8 F (37.1 C) (Oral)   Ht '6\' 1"'$  (1.854 m)   Wt (!) 466 lb (211.4 kg)   SpO2 98%   BMI 61.48 kg/m       Objective:   Physical Exam Vitals and nursing note reviewed.  Constitutional:      Appearance: Normal appearance. She is obese.  Cardiovascular:     Rate and Rhythm: Normal rate and regular rhythm.     Pulses: Normal pulses.     Heart sounds: Normal heart sounds.  Pulmonary:     Effort: Pulmonary effort is normal.     Breath sounds: Normal breath sounds.  Abdominal:     General: Abdomen is flat. Bowel sounds are normal.     Palpations: Abdomen is soft.     Comments: Well healing surgical scars on abdomen   Musculoskeletal:        General: Normal range of motion.  Skin:    General: Skin is warm and dry.  Neurological:     General: No focal deficit present.     Mental Status: She is alert and oriented to person, place, and time.  Psychiatric:        Mood and Affect: Mood normal.        Behavior: Behavior normal.        Thought Content: Thought content normal.        Judgment: Judgment normal.       Assessment & Plan:  1. S/P gastric sleeve procedure - She is doing well over all and is very happy with the procedure. Encouraged to stay hydrated, small sips, and try chicken broth. I am excited to see her transformation. She knows to follow up with her bariatric surgeon and to follow up with me as needed   Dorothyann Peng, NP

## 2022-05-25 ENCOUNTER — Emergency Department (HOSPITAL_BASED_OUTPATIENT_CLINIC_OR_DEPARTMENT_OTHER): Payer: Medicaid Other

## 2022-05-25 ENCOUNTER — Encounter (HOSPITAL_BASED_OUTPATIENT_CLINIC_OR_DEPARTMENT_OTHER): Payer: Self-pay | Admitting: Emergency Medicine

## 2022-05-25 ENCOUNTER — Emergency Department (HOSPITAL_BASED_OUTPATIENT_CLINIC_OR_DEPARTMENT_OTHER)
Admission: EM | Admit: 2022-05-25 | Discharge: 2022-05-25 | Disposition: A | Discharge status: Home / Self Care | Payer: Medicaid Other | Attending: Emergency Medicine | Admitting: Emergency Medicine

## 2022-05-25 ENCOUNTER — Emergency Department (HOSPITAL_BASED_OUTPATIENT_CLINIC_OR_DEPARTMENT_OTHER): Payer: Medicaid Other | Admitting: Radiology

## 2022-05-25 DIAGNOSIS — M79661 Pain in right lower leg: Secondary | ICD-10-CM | POA: Insufficient documentation

## 2022-05-25 DIAGNOSIS — Z91040 Latex allergy status: Secondary | ICD-10-CM | POA: Insufficient documentation

## 2022-05-25 DIAGNOSIS — R079 Chest pain, unspecified: Secondary | ICD-10-CM | POA: Diagnosis not present

## 2022-05-25 DIAGNOSIS — M79662 Pain in left lower leg: Secondary | ICD-10-CM | POA: Diagnosis not present

## 2022-05-25 DIAGNOSIS — R0602 Shortness of breath: Secondary | ICD-10-CM | POA: Diagnosis not present

## 2022-05-25 DIAGNOSIS — R0600 Dyspnea, unspecified: Secondary | ICD-10-CM | POA: Diagnosis not present

## 2022-05-25 DIAGNOSIS — R109 Unspecified abdominal pain: Secondary | ICD-10-CM | POA: Diagnosis not present

## 2022-05-25 DIAGNOSIS — R0789 Other chest pain: Secondary | ICD-10-CM | POA: Diagnosis not present

## 2022-05-25 LAB — URINALYSIS, ROUTINE W REFLEX MICROSCOPIC
Bilirubin Urine: NEGATIVE
Glucose, UA: NEGATIVE mg/dL
Ketones, ur: 15 mg/dL — AB
Leukocytes,Ua: NEGATIVE
Nitrite: NEGATIVE
Specific Gravity, Urine: 1.029 (ref 1.005–1.030)
pH: 6 (ref 5.0–8.0)

## 2022-05-25 LAB — CBC WITH DIFFERENTIAL/PLATELET
Abs Immature Granulocytes: 0.01 10*3/uL (ref 0.00–0.07)
Basophils Absolute: 0 10*3/uL (ref 0.0–0.1)
Basophils Relative: 0 %
Eosinophils Absolute: 0 10*3/uL (ref 0.0–0.5)
Eosinophils Relative: 1 %
HCT: 38.7 % (ref 36.0–46.0)
Hemoglobin: 12.1 g/dL (ref 12.0–15.0)
Immature Granulocytes: 0 %
Lymphocytes Relative: 27 %
Lymphs Abs: 1.6 10*3/uL (ref 0.7–4.0)
MCH: 23.4 pg — ABNORMAL LOW (ref 26.0–34.0)
MCHC: 31.3 g/dL (ref 30.0–36.0)
MCV: 75 fL — ABNORMAL LOW (ref 80.0–100.0)
Monocytes Absolute: 0.3 10*3/uL (ref 0.1–1.0)
Monocytes Relative: 6 %
Neutro Abs: 3.9 10*3/uL (ref 1.7–7.7)
Neutrophils Relative %: 66 %
Platelets: 268 10*3/uL (ref 150–400)
RBC: 5.16 MIL/uL — ABNORMAL HIGH (ref 3.87–5.11)
RDW: 14.6 % (ref 11.5–15.5)
WBC: 5.9 10*3/uL (ref 4.0–10.5)
nRBC: 0 % (ref 0.0–0.2)

## 2022-05-25 LAB — COMPREHENSIVE METABOLIC PANEL
ALT: 49 U/L — ABNORMAL HIGH (ref 0–44)
AST: 38 U/L (ref 15–41)
Albumin: 4.3 g/dL (ref 3.5–5.0)
Alkaline Phosphatase: 65 U/L (ref 38–126)
Anion gap: 13 (ref 5–15)
BUN: <5 mg/dL — ABNORMAL LOW (ref 6–20)
CO2: 25 mmol/L (ref 22–32)
Calcium: 9.6 mg/dL (ref 8.9–10.3)
Chloride: 102 mmol/L (ref 98–111)
Creatinine, Ser: 0.58 mg/dL (ref 0.44–1.00)
GFR, Estimated: >60 mL/min (ref >60)
Glucose, Bld: 83 mg/dL (ref 70–99)
Potassium: 3.8 mmol/L (ref 3.5–5.1)
Sodium: 140 mmol/L (ref 135–145)
Total Bilirubin: 0.8 mg/dL (ref 0.3–1.2)
Total Protein: 8.1 g/dL (ref 6.5–8.1)

## 2022-05-25 LAB — TROPONIN I (HIGH SENSITIVITY)
Troponin I (High Sensitivity): 3 ng/L (ref <18)
Troponin I (High Sensitivity): 3 ng/L (ref <18)

## 2022-05-25 LAB — CK: Total CK: 87 U/L (ref 38–234)

## 2022-05-25 LAB — PREGNANCY, URINE: Preg Test, Ur: NEGATIVE

## 2022-05-25 LAB — D-DIMER, QUANTITATIVE: D-Dimer, Quant: 2.39 ug/mL-FEU — ABNORMAL HIGH (ref 0.00–0.50)

## 2022-05-25 MED ORDER — SODIUM CHLORIDE 0.9 % IV BOLUS
1000.0000 mL | Freq: Once | INTRAVENOUS | Status: AC
  Administered 2022-05-25: 1000 mL via INTRAVENOUS

## 2022-05-25 MED ORDER — IOHEXOL 350 MG/ML SOLN
100.0000 mL | Freq: Once | INTRAVENOUS | Status: AC | PRN
  Administered 2022-05-25: 100 mL via INTRAVENOUS

## 2022-05-25 NOTE — ED Notes (Signed)
Patient transported to CT 

## 2022-05-25 NOTE — ED Triage Notes (Signed)
Gastric sleeve in [place] on Monday, reports cramping in both calves, urine is dark, chest discomfort, pt gets nauseated when trying to drink anything and cant meet her liquid goals.

## 2022-05-25 NOTE — ED Provider Notes (Signed)
[facility] Provider Note   CSN: [id number] Arrival date & time: 05/25/22  1050     History  Chief Complaint  Patient presents with   Post-op Problem   HPI Maureen Lewis is a 26 y.o. female with recent history of gastric sleeve surgery on July 31 presenting today for postop concern.  Patient reports that she has been unable to reach her postop fluid goals.  Reports that she is supposed to drink 64 ounces of water and 64 ounces of liquid protein.  She only takes approximately 24 ounces of water per day because of nausea prompting her to be evaluated today in the emergency department.  She also complains of recent history of shortness of breath and chest pain which started about 3 days ago and is somewhat worsened over the last 3 days.  Shortness of breath comes and goes.  Chest pain is substernal and is reproducible.  Also endorses bilateral calf tenderness but did report that since the surgery she has been walking every day. She also reports that urine is dark almost brown in color.  Postop meds include Prilosec, oxycodone, and Zofran.  Patient reports that she is on her period.     Home Medications Prior to Admission medications   Medication Sig Start Date End Date Taking? Authorizing Provider  acetaZOLAMIDE ER (DIAMOX) 500 MG capsule Take by mouth. 08/16/20   [provider]  Blood Pressure Monitoring (BLOOD PRESSURE MON/AUTO/WRIST) DEVI Use to monitor blood pressure 09/24/21   Nafziger, Tommi Rumps, NP  chlorhexidine (PERIDEX) 0.12 % solution SMARTSIG:By Mouth 09/30/21   [provider]  dicyclomine (BENTYL) 10 MG capsule dicyclomine 10 mg capsule    [provider]  Diethylpropion HCl CR 75 MG TB24 Take by mouth. 03/23/21   [provider]  ferrous sulfate 325 (65 FE) MG tablet Take 1 tablet (325 mg total) by mouth every other day. 02/23/20 07/19/21  Nafziger, Tommi Rumps, NP  fluticasone-salmeterol (ADVAIR HFA) 474-25 MCG/ACT  inhaler Inhale 2 puffs into the lungs 2 (two) times daily. 05/29/21   Dorothyann Peng, NP  levonorgestrel [facility]) 19.5 MG IUD     [provider]  loratadine (CLARITIN) 10 MG tablet Take 10 mg by mouth daily as needed for allergies.     [provider]  nortriptyline (PAMELOR) 25 MG capsule Take 1 capsule (25 mg total) by mouth at bedtime. 11/05/21   Gregor Hams, MD  ondansetron (ZOFRAN-ODT) 8 MG disintegrating tablet ondansetron 8 mg disintegrating tablet    [provider]  oxyCODONE-acetaminophen (PERCOCET/ROXICET) 5-325 MG tablet Take 1 tablet by mouth 5 (five) times daily.    [provider]  pantoprazole (PROTONIX) 40 MG tablet Take 1 tablet (40 mg total) by mouth daily. 06/11/21   Maryjane Hurter, MD  tiZANidine (ZANAFLEX) 4 MG tablet Take 4 mg by mouth 3 (three) times daily as needed. 08/28/21   [provider]  topiramate (TOPAMAX) 50 MG tablet Take by mouth. 03/21/21   [provider]  Vitamin D, Ergocalciferol, (DRISDOL) 1.25 MG (50000 UNIT) CAPS capsule Take 1 capsule (50,000 Units total) by mouth every 7 (seven) days. Patient taking differently: Take 50,000 Units by mouth every Monday. 05/06/21   Laqueta Linden, MD      Allergies    Bee pollen, Bee venom, Hydrocodone-acetaminophen, Peach flavor, Pollen extract, Remdesivir, Latex, Cortisone, and Cortizone-10 [hydrocortisone]    Review of Systems   Review of Systems  Respiratory:  Positive for shortness of breath.  Cardiovascular:  Positive for chest pain.  Gastrointestinal:  Positive for nausea.    Physical Exam Updated Vital Signs BP (!) 149/81 (BP Location: Right Arm)   Pulse 73   Temp 98 F (36.7 C) (Oral)   Resp 18   SpO2 99%  Physical Exam Vitals and nursing note reviewed.  Constitutional:      General: She is not in acute distress.    Appearance: Normal appearance. She is obese. She is not ill-appearing.  HENT:     Head: Normocephalic and atraumatic.      Mouth/Throat:     Mouth: Mucous membranes are moist.  Eyes:     General:        Right eye: No discharge.        Left eye: No discharge.     Conjunctiva/sclera: Conjunctivae normal.  Cardiovascular:     Rate and Rhythm: Normal rate and regular rhythm.     Pulses: Normal pulses.     Heart sounds: Normal heart sounds.  Pulmonary:     Effort: Pulmonary effort is normal.     Breath sounds: Normal breath sounds.  Abdominal:     General: Abdomen is flat.     Palpations: Abdomen is soft.  Skin:    General: Skin is warm and dry.  Neurological:     General: No focal deficit present.     Mental Status: She is alert.  Psychiatric:        Mood and Affect: Mood normal.     ED Results / Procedures / Treatments   Labs (all labs ordered are listed, but only abnormal results are displayed) Labs Reviewed  COMPREHENSIVE METABOLIC PANEL - Abnormal; Notable for the following components:      Result Value   BUN <5 (*)    ALT 49 (*)    All other components within normal limits  D-DIMER, QUANTITATIVE - Abnormal; Notable for the following components:   D-Dimer, Quant 2.39 (*)    All other components within normal limits  URINALYSIS, ROUTINE W REFLEX MICROSCOPIC - Abnormal; Notable for the following components:   Hgb urine dipstick LARGE (*)    Ketones, ur 15 (*)    Protein, ur TRACE (*)    All other components within normal limits  CBC WITH DIFFERENTIAL/PLATELET - Abnormal; Notable for the following components:   RBC 5.16 (*)    MCV 75.0 (*)    MCH 23.4 (*)    All other components within normal limits  CK  PREGNANCY, URINE  CBC WITH DIFFERENTIAL/PLATELET  TROPONIN I (HIGH SENSITIVITY)  TROPONIN I (HIGH SENSITIVITY)    EKG Revealed NSR  Radiology US Venous Img Lower Bilateral (DVT)  Result Date: 05/25/2022 CLINICAL DATA:  Bilateral calf pain. EXAM: BILATERAL LOWER EXTREMITY VENOUS DOPPLER ULTRASOUND TECHNIQUE: Gray-scale sonography with graded compression, as well as color Doppler  and duplex ultrasound were performed to evaluate the lower extremity deep venous systems from the level of the common femoral vein and including the common femoral, femoral, profunda femoral, popliteal and calf veins including the posterior tibial, peroneal and gastrocnemius veins when visible. The superficial great saphenous vein was also interrogated. Spectral Doppler was utilized to evaluate flow at rest and with distal augmentation maneuvers in the common femoral, femoral and popliteal veins. COMPARISON:  None Available. FINDINGS: RIGHT LOWER EXTREMITY Common Femoral Vein: No evidence of thrombus. Normal compressibility, respiratory phasicity and response to augmentation. Saphenofemoral Junction: No evidence of thrombus. Normal compressibility and flow on color Doppler imaging. Profunda Femoral Vein:  No evidence of thrombus. Normal compressibility and flow on color Doppler imaging. Femoral Vein: No evidence of thrombus. Normal compressibility, respiratory phasicity and response to augmentation. Popliteal Vein: Limited visualization of the posterior tibial vein and peroneal vein could not be visualized due to body habitus. Calf Veins: No evidence of thrombus. Normal compressibility and flow on color Doppler imaging. Other Findings:  None. LEFT LOWER EXTREMITY Common Femoral Vein: No evidence of thrombus. Normal compressibility, respiratory phasicity and response to augmentation. Saphenofemoral Junction: No evidence of thrombus. Normal compressibility and flow on color Doppler imaging. Profunda Femoral Vein: No evidence of thrombus. Normal compressibility and flow on color Doppler imaging. Femoral Vein: No evidence of thrombus. Normal compressibility, respiratory phasicity and response to augmentation. Popliteal Vein: No evidence of thrombus. Normal compressibility, respiratory phasicity and response to augmentation. Calf Veins: Limited visualization of the posterior tibial vein with nonvisualization of the  peroneal vein. Other Findings:  None. IMPRESSION: 1. No evidence of deep venous thrombosis in either lower extremity. 2. Limited visualization of the calf veins bilaterally due to technical factors related to patient body habitus. Electronically Signed   By: Misty Stanley M.D.   On: 05/25/2022 16:07   CT Angio Chest PE W/Cm &/Or Wo Cm  Result Date: 05/25/2022 CLINICAL DATA:  Gastric sleeve surgery 7 days ago. Cramping in both calves. Chest discomfort. Short of breath. EXAM: CT ANGIOGRAPHY CHEST WITH CONTRAST TECHNIQUE: Multidetector CT imaging of the chest was performed using the standard protocol during bolus administration of intravenous contrast. Multiplanar CT image reconstructions and MIPs were obtained to evaluate the vascular anatomy. RADIATION DOSE REDUCTION: This exam was performed according to the departmental dose-optimization program which includes automated exposure control, adjustment of the mA and/or kV according to patient size and/or use of iterative reconstruction technique. CONTRAST:  145m OMNIPAQUE IOHEXOL 350 MG/ML SOLN COMPARISON:  Current chest radiograph. FINDINGS: Cardiovascular: Central pulmonary arteries are well opacified. Segmental and smaller pulmonary arteries are suboptimally opacified, assessment also limited by the patient's body habitus. Allowing for this limitation, there is no evidence of a pulmonary embolism. Heart is normal in size and configuration. No pericardial effusion. Normal great vessels. Mediastinum/Nodes: No enlarged mediastinal, hilar, or axillary lymph nodes. Thyroid gland, trachea, and esophagus demonstrate no significant findings. Lungs/Pleura: Lungs are clear. No pleural effusion or pneumothorax. Upper Abdomen: Changes from the recent gastric sleeve procedure. No evidence of an operative complication. Musculoskeletal: No chest wall abnormality. No acute or significant osseous findings. Review of the MIP images confirms the above findings. IMPRESSION: 1. Study  limited by the patient's body habitus and suboptimal opacification segmental and smaller pulmonary arteries. There is no evidence a large or central pulmonary embolism. 2. No acute findings.  Clear lungs. Electronically Signed   By: DLajean ManesM.D.   On: 05/25/2022 15:07   DG Chest 2 View  Result Date: 05/25/2022 CLINICAL DATA:  Shortness of breath.  Recent sleeve gastrectomy. EXAM: CHEST - 2 VIEW COMPARISON:  06/07/2021 FINDINGS: The lungs are clear without focal pneumonia, edema, pneumothorax or pleural effusion. Cardiopericardial silhouette is at upper limits of normal for size. The visualized bony structures of the thorax are unremarkable. Telemetry leads overlie the chest. IMPRESSION: No active cardiopulmonary disease. Electronically Signed   By: EMisty StanleyM.D.   On: 05/25/2022 13:42    Procedures Procedures    Medications Ordered in ED Medications  sodium chloride 0.9 % bolus 1,000 mL ( Intravenous Stopped 05/25/22 1436)  iohexol (OMNIPAQUE) 350 MG/ML injection 100 mL (100 mLs Intravenous  Contrast Given 05/25/22 1440)    ED Course/ Medical Decision Making/ A&P  Medical Decision Making Amount and/or Complexity of Data Reviewed Labs: ordered. Radiology: ordered.   This patient presents to the ED for concern of nausea, shortness of breath and chest pain, this involves a number of treatment options, and is a complaint that carries with it a high risk of complications and morbidity.  The differential diagnosis includes PE status post surgery, DVT, ACS, rhabdomyolysis, nausea, and vomiting.    Co morbidities: Discussed in HPI   EMR reviewed including pt PMHx, past surgical history and past visits to ER.   See HPI for more details   Lab Tests:   I ordered and independently interpreted labs. Labs notable for hematuria and elevated D-dimer   Imaging Studies:  NAD. I personally reviewed all imaging studies and no acute abnormality found. I agree with radiology  interpretation.    Cardiac Monitoring:  The patient was maintained on a cardiac monitor.  I personally viewed and interpreted the cardiac monitored which showed an underlying rhythm of: NSR EKG non-ischemic   Medicines ordered:  I ordered medication including 1 L normal saline for volume resuscitation Reevaluation of the patient after these medicines showed that the patient improved I have reviewed the patients home medicines and have made adjustments as needed    Reevaluation:  After the interventions noted above I re-evaluated patient and found that they have :improved   Social Determinants of Health:   Social determinants of health not a factor in her care today.    Problem List / ED Course: Patient presented with multiple complaints including shortness of breath, chest pain, bilateral calf tenderness, and an ability to meet her fluid goals status post recent gastric sleeve surgery.  Given recent surgery and complaints of shortness of breath and chest pain there was concern for possible DVT or PE.  D-dimer was elevated which prompted CT angio. CT was negative.  Vitals also were normal and EKG unremarkable.  Ultrasound of both legs revealed no concern for DVT.  Therefore PE DVT and ACS were unlikely given result.  Overall patient's symptoms improved with fluids.  Advised her to follow-up with [facility] surgery given her inability to meet her fluid goals.  PO challenge here in the emergency department was adequate.   Dispostion:  After consideration of the diagnostic results and the patients response to treatment, I feel that the patent would benefit from close follow-up with her surgical team with regards to inability to meet fluid daily goals.  Otherwise patient is appropriate to discharge home.        Final Clinical Impression(s) / ED Diagnoses Final diagnoses:  Tenderness of left calf  Tenderness of right calf  Dyspnea, unspecified type  Chest pain, unspecified type     Rx / DC Orders ED Discharge Orders     None         Harriet Pho, PA-C 05/25/22 1705    Gareth Morgan, MD 05/25/22 2344

## 2022-05-25 NOTE — Discharge Instructions (Signed)
Diagnosed with calf tenderness bilaterally, shortness of breath, chest pain, and nausea.  Work-up was overall reassuring.  CT scan was negative.  Recommend follow-up with surgical team with regards to inability to meet daily fluid goals after surgery.

## 2022-05-27 ENCOUNTER — Telehealth: Payer: Self-pay

## 2022-05-27 NOTE — Patient Outreach (Signed)
Care Coordination  05/27/2022  George Mason [birth year] [id number]  Transition Care Management Follow-up Telephone Call Date of discharge and from where: 05/25/22 [facility]  How have you been since you were released from the hospital? Good  Any questions or concerns? No  Items Reviewed: Did the pt receive and understand the discharge instructions provided? Yes  Medications obtained and verified? No  Other? No  Any new allergies since your discharge? No  Dietary orders reviewed? No Do you have support at home? Yes   Home Care and Equipment/Supplies: Were home health services ordered? not applicable If so, what is the name of the agency? Has the agency set up a time to come to the patient's home? not applicable Were any new equipment or medical supplies ordered?  No What is the name of the medical supply agency? N/A Were you able to get the supplies/equipment? not applicable Do you have any questions related to the use of the equipment or supplies? No  Functional Questionnaire: (I = Independent and D = Dependent) ADLs: I  Bathing/Dressing- I  Meal Prep- I  Eating- I  Maintaining continence- I  Transferring/Ambulation- I  Managing Meds- I  Follow up appointments reviewed:  PCP Hospital f/u appt confirmed? No  Scheduled to see  Specialist Hospital f/u appt confirmed? No  Scheduled to see  Are transportation arrangements needed? No  If their condition worsens, is the pt aware to call PCP or go to the Emergency Dept.? Yes Was the patient provided with contact information for the PCP's office or ED? Yes Was to pt encouraged to call back with questions or concerns? Yes

## 2022-05-28 ENCOUNTER — Encounter (INDEPENDENT_AMBULATORY_CARE_PROVIDER_SITE_OTHER): Payer: Self-pay

## 2022-05-30 DIAGNOSIS — M546 Pain in thoracic spine: Secondary | ICD-10-CM | POA: Diagnosis not present

## 2022-05-30 DIAGNOSIS — N915 Oligomenorrhea, unspecified: Secondary | ICD-10-CM | POA: Diagnosis not present

## 2022-05-30 DIAGNOSIS — Z6841 Body Mass Index (BMI) 40.0 and over, adult: Secondary | ICD-10-CM | POA: Diagnosis not present

## 2022-05-30 DIAGNOSIS — M25571 Pain in right ankle and joints of right foot: Secondary | ICD-10-CM | POA: Diagnosis not present

## 2022-05-30 DIAGNOSIS — M79642 Pain in left hand: Secondary | ICD-10-CM | POA: Diagnosis not present

## 2022-05-30 DIAGNOSIS — M79641 Pain in right hand: Secondary | ICD-10-CM | POA: Diagnosis not present

## 2022-05-30 DIAGNOSIS — E559 Vitamin D deficiency, unspecified: Secondary | ICD-10-CM | POA: Diagnosis not present

## 2022-05-30 DIAGNOSIS — M419 Scoliosis, unspecified: Secondary | ICD-10-CM | POA: Diagnosis not present

## 2022-05-30 DIAGNOSIS — M25551 Pain in right hip: Secondary | ICD-10-CM | POA: Diagnosis not present

## 2022-05-30 DIAGNOSIS — M545 Low back pain, unspecified: Secondary | ICD-10-CM | POA: Diagnosis not present

## 2022-05-30 DIAGNOSIS — Z79899 Other long term (current) drug therapy: Secondary | ICD-10-CM | POA: Diagnosis not present

## 2022-05-30 DIAGNOSIS — M542 Cervicalgia: Secondary | ICD-10-CM | POA: Diagnosis not present

## 2022-06-04 DIAGNOSIS — Z79899 Other long term (current) drug therapy: Secondary | ICD-10-CM | POA: Diagnosis not present

## 2022-06-13 ENCOUNTER — Encounter: Payer: Self-pay | Admitting: Adult Health

## 2022-06-19 DIAGNOSIS — M25572 Pain in left ankle and joints of left foot: Secondary | ICD-10-CM | POA: Diagnosis not present

## 2022-06-19 DIAGNOSIS — M25571 Pain in right ankle and joints of right foot: Secondary | ICD-10-CM | POA: Diagnosis not present

## 2022-06-19 DIAGNOSIS — F439 Reaction to severe stress, unspecified: Secondary | ICD-10-CM | POA: Diagnosis not present

## 2022-06-19 DIAGNOSIS — M25561 Pain in right knee: Secondary | ICD-10-CM | POA: Diagnosis not present

## 2022-06-19 DIAGNOSIS — M25562 Pain in left knee: Secondary | ICD-10-CM | POA: Diagnosis not present

## 2022-06-26 ENCOUNTER — Encounter: Payer: Self-pay | Admitting: Adult Health

## 2022-06-26 ENCOUNTER — Ambulatory Visit: Payer: Medicaid Other | Admitting: Adult Health

## 2022-06-26 VITALS — BP 120/90 | HR 75 | Temp 98.0°F | Ht 73.0 in | Wt >= 6400 oz

## 2022-06-26 DIAGNOSIS — J028 Acute pharyngitis due to other specified organisms: Secondary | ICD-10-CM

## 2022-06-26 DIAGNOSIS — B9789 Other viral agents as the cause of diseases classified elsewhere: Secondary | ICD-10-CM | POA: Diagnosis not present

## 2022-06-26 LAB — POCT RAPID STREP A (OFFICE): Rapid Strep A Screen: NEGATIVE

## 2022-06-26 MED ORDER — METHYLPREDNISOLONE 4 MG PO TBPK: ORAL_TABLET | ORAL | 0 refills | Status: DC

## 2022-06-26 NOTE — Progress Notes (Signed)
Subjective:    Patient ID: Maureen Lewis, female    DOB: 10-Mar-1996, 26 y.o.   MRN: [id number]  HPI 26 year old female who  has a past medical history of Abnormal uterine bleeding, Acid reflux, Alpha thalassemia trait, Amenorrhea, Anemia, Asthma, Back pain, Cesarean delivery delivered (10/11/2019), Chest pain, Complication of anesthesia, Constipation, Depression, Fibromyalgia, Finger mass, left (03/2017), Gestational diabetes (05/23/2019), Gestational hypertension (09/23/2019), Headache, Hypertension, Infection, Irritable bowel syndrome (IBS), Joint pain, Morbid obesity with body mass index (BMI) of 45.0 to 49.9 in adult [facility]), Obesity during pregnancy, antepartum (04/06/2019), Plantar fasciitis, bilateral, Polycystic ovary syndrome, Pre-diabetes, Prediabetes, Pregnancy induced hypertension, Shortness of breath, Sleep apnea, Supervision of normal first pregnancy (04/06/2019), and Vertigo (2017).  She is being evaluated today for an acute issue of sore throat, productive cough  that has turned into dry, and sore throat that causes difficulty swallowing. She does feel as though over the last two weeks her symptoms improved but then reverted back.   She has not had any fever or chills   At home she has been drinking warm tea with honey and using cough drops without improvement.    Review of Systems See HPI   Past Medical History:  Diagnosis Date   Abnormal uterine bleeding    Acid reflux    Alpha thalassemia trait    Amenorrhea    Anemia    no current med.   Asthma    Back pain    Cesarean delivery delivered 10/11/2019   10/09/2019 - primary CS for failed IOL   Chest pain    resolved   Complication of anesthesia    states had to keep giving her anesthesia during EGD   Constipation    Depression    "I'm good"   Fibromyalgia    Finger mass, left 03/2017   middle finger   Gestational diabetes 05/23/2019   hx - with pregnancy only    Gestational hypertension 09/23/2019    Guidelines for Antenatal Testing and Sonography  (with updated ICD-10 codes)  Updated  29-Sep-2019 with Dr. Tama High  INDICATION U/S 2 X week NST/AFI  or full BPP wkly DELIVERY Diabetes   A1 - good control - O24.410    A2 - good control - O24.419      A2  - poor control or poor compliance - O24.419, E11.65   (Macrosomia or polyhydramnios) **E11.65 is extra code for poor control**    A2/B - O24.   Headache    Hypertension    Infection    UTI   Irritable bowel syndrome (IBS)    Joint pain    Morbid obesity with body mass index (BMI) of 45.0 to 49.9 in adult [facility])    Obesity during pregnancy, antepartum 04/06/2019   Body mass index is 53.71 kg/m.  Recommendations '[x]'$  Aspirin 81 mg daily after 12 weeks; discontinue after 36 weeks '[ ]'$  Nutrition consult '[ ]'$  Weight gain 11-20 lbs for singleton and 25-35 lbs for twin pregnancy (IOM guidelines) Higher class of obesity patients recommended to gain closer to lower limit  Weight loss is associated with adverse outcomes '[ ]'$  Baseline and surveillance labs (pulled in fr   Plantar fasciitis, bilateral    resolved   Polycystic ovary syndrome    Pre-diabetes    no meds, diet controlled, diet not check blood sugar   Prediabetes    "prediabetes"   Pregnancy induced hypertension    Shortness of breath    albuterol inhaler  Sleep apnea    no CPAP use   Supervision of normal first pregnancy 04/06/2019   [name]: [ x]Initial [ x]12 '[ ]'$ 20 '[ ]'$ 28 '[ ]'$ 32 '[ ]'$ 36 '[ ]'$ 38 '[ ]'$ 39 '[ ]'$ 40 Nursing Staff Provider Office Location [facility]  Dating   LMP Language  English  Anatomy US  Nml Flu Vaccine  Declined 07/28/19 Genetic Screen  NIPS: low risks   AFP:   negative  TDaP vaccine   08/25/19 Hgb A1C or  GTT Early A1C 5.8 Third trimester: GDM insulin Rhogam  NA   LAB RESULTS  Feeding Plan Breast Blood Type   Vertigo 2017    Social History   Socioeconomic History   Marital status: Single    Spouse name: Not on file   Number of children: Not on file   Years of  education: Not on file   Highest education level: Not on file  Occupational History   Occupation: Chemical engineer    Employer: [id number]  Tobacco Use   Smoking status: Never   Smokeless tobacco: Never  Vaping Use   Vaping Use: Never used  Substance and Sexual Activity   Alcohol use: Not Currently    Comment: social    Drug use: No   Sexual activity: Yes    Birth control/protection: I.U.D.  Other Topics Concern   Not on file  Social History Narrative   Right handed    Soda sometimes   Lives with mom and cousin, a grandmother in [place] also helps care for her.       She is in nursing school.    Social Determinants of Health   Financial Resource Strain: Not on file  Food Insecurity: Not on file  Transportation Needs: Not on file  Physical Activity: Not on file  Stress: Not on file  Social Connections: Not on file  Intimate Partner Violence: Not on file    Past Surgical History:  Procedure Laterality Date   CESAREAN SECTION N/A 10/09/2019   Procedure: CESAREAN SECTION;  Surgeon: Aletha Halim, MD;  [facility];  Service: Obstetrics;  Laterality: N/A;   COLONOSCOPY WITH PROPOFOL  10/07/2016   DILATION AND CURETTAGE OF UTERUS     ESOPHAGOGASTRODUODENOSCOPY  12/21/2015   EXCISION MASS UPPER EXTREMETIES Left 04/21/2017   Procedure: EXCISION MASS LEFT MIDDLE FINGER;  Surgeon: Daryll Brod, MD;  Location: [place];  Service: Orthopedics;  Laterality: Left;   gastric sleeve     july 31   IR FL GUIDED LOC OF NEEDLE/CATH TIP FOR SPINAL INJECTION RT  03/13/2020   PHOTOCOAGULATION WITH LASER Left 12/13/2020   Procedure: RETINOPLEXY LEFT EYE;  Surgeon: Sherlynn Stalls, MD;  Location: [place];  Service: Ophthalmology;  Laterality: Left;   SCLERAL BUCKLE Right 12/13/2020   Procedure: SCLERAL BUCKLE WITH CRYO;  Surgeon: Sherlynn Stalls, MD;  Location: [place];  Service: Ophthalmology;  Laterality: Right;    Family History  Problem Relation Age of Onset    Diabetes Maternal Aunt    Hypertension Maternal Uncle    Depression Maternal Grandfather    Diabetes Maternal Grandfather    Hypertension Maternal Grandfather    Arthritis Mother    High blood pressure Mother    Depression Mother    Sleep apnea Mother    Obesity Mother    Diabetes Mother    Hypertension Mother    Kidney failure Mother    Sleep apnea Father    Obesity Father    Healthy Daughter  Allergies  Allergen Reactions   Bee Pollen Hives, Shortness Of Breath and Swelling   Bee Venom Hives, Shortness Of Breath and Swelling   Hydrocodone-Acetaminophen Anaphylaxis    No problem when she takes Tylenol   Peach Flavor Hives, Shortness Of Breath and Other (See Comments)    SWELLING OF MOUTH   Pollen Extract Hives, Shortness Of Breath and Swelling   Remdesivir Swelling    Angioedema 8/25   Latex Rash   Cortisone Itching and Swelling   Cortizone-10 [Hydrocortisone]     Swelling and itching    Current Outpatient Medications on File Prior to Visit  Medication Sig Dispense Refill   acetaZOLAMIDE ER (DIAMOX) 500 MG capsule Take by mouth.     Blood Pressure Monitoring (BLOOD PRESSURE MON/AUTO/WRIST) DEVI Use to monitor blood pressure 1 each 0   chlorhexidine (PERIDEX) 0.12 % solution SMARTSIG:By Mouth     dicyclomine (BENTYL) 10 MG capsule dicyclomine 10 mg capsule     Diethylpropion HCl CR 75 MG TB24 Take by mouth.     ferrous sulfate 325 (65 FE) MG tablet Take 1 tablet (325 mg total) by mouth every other day. 45 tablet 1   fluticasone-salmeterol (ADVAIR HFA) 115-21 MCG/ACT inhaler Inhale 2 puffs into the lungs 2 (two) times daily. 3 each 3   levonorgestrel (KYLEENA) 19.5 MG IUD      loratadine (CLARITIN) 10 MG tablet Take 10 mg by mouth daily as needed for allergies.      nortriptyline (PAMELOR) 25 MG capsule Take 1 capsule (25 mg total) by mouth at bedtime. 30 capsule 2   ondansetron (ZOFRAN-ODT) 8 MG disintegrating tablet ondansetron 8 mg disintegrating tablet      oxyCODONE-acetaminophen (PERCOCET/ROXICET) 5-325 MG tablet Take 1 tablet by mouth 5 (five) times daily.     pantoprazole (PROTONIX) 40 MG tablet Take 1 tablet (40 mg total) by mouth daily. 90 tablet 0   tiZANidine (ZANAFLEX) 4 MG tablet Take 4 mg by mouth 3 (three) times daily as needed.     topiramate (TOPAMAX) 50 MG tablet Take by mouth.     Vitamin D, Ergocalciferol, (DRISDOL) 1.25 MG (50000 UNIT) CAPS capsule Take 1 capsule (50,000 Units total) by mouth every 7 (seven) days. (Patient taking differently: Take 50,000 Units by mouth every Monday.) 4 capsule 0   No current facility-administered medications on file prior to visit.    Temp 98 F (36.7 C) (Oral)   Ht '6\' 1"'$  (1.854 m)   Wt (!) 446 lb (202.3 kg)   BMI 58.84 kg/m       Objective:   Physical Exam Vitals and nursing note reviewed.  Constitutional:      Appearance: Normal appearance.  HENT:     Mouth/Throat:     Pharynx: Posterior oropharyngeal erythema present.  Cardiovascular:     Rate and Rhythm: Normal rate and regular rhythm.     Pulses: Normal pulses.     Heart sounds: Normal heart sounds.  Pulmonary:     Effort: Pulmonary effort is normal.     Breath sounds: Normal breath sounds.  Skin:    General: Skin is warm and dry.     Capillary Refill: Capillary refill takes less than 2 seconds.  Neurological:     General: No focal deficit present.     Mental Status: She is alert and oriented to person, place, and time.  Psychiatric:        Mood and Affect: Mood normal.        Behavior: Behavior  normal.        Thought Content: Thought content normal.        Judgment: Judgment normal.       Assessment & Plan:  1. Viral sore throat - Strep negative  - Likely viral sore throat will treat with medrol dose pack - Follow up if not resolved in the next 2-3 days  - methylPREDNISolone (MEDROL DOSEPAK) 4 MG TBPK tablet; Take as directed  Dispense: 21 tablet; Refill: 0  Dorothyann Peng, NP

## 2022-06-30 ENCOUNTER — Ambulatory Visit: Payer: Medicaid Other | Admitting: Obstetrics and Gynecology

## 2022-06-30 DIAGNOSIS — M545 Low back pain, unspecified: Secondary | ICD-10-CM | POA: Diagnosis not present

## 2022-06-30 DIAGNOSIS — R768 Other specified abnormal immunological findings in serum: Secondary | ICD-10-CM | POA: Diagnosis not present

## 2022-06-30 DIAGNOSIS — Z6841 Body Mass Index (BMI) 40.0 and over, adult: Secondary | ICD-10-CM | POA: Diagnosis not present

## 2022-06-30 DIAGNOSIS — N915 Oligomenorrhea, unspecified: Secondary | ICD-10-CM | POA: Diagnosis not present

## 2022-06-30 DIAGNOSIS — Z79899 Other long term (current) drug therapy: Secondary | ICD-10-CM | POA: Diagnosis not present

## 2022-06-30 DIAGNOSIS — E611 Iron deficiency: Secondary | ICD-10-CM | POA: Diagnosis not present

## 2022-06-30 DIAGNOSIS — M546 Pain in thoracic spine: Secondary | ICD-10-CM | POA: Diagnosis not present

## 2022-06-30 DIAGNOSIS — M25571 Pain in right ankle and joints of right foot: Secondary | ICD-10-CM | POA: Diagnosis not present

## 2022-06-30 DIAGNOSIS — M419 Scoliosis, unspecified: Secondary | ICD-10-CM | POA: Diagnosis not present

## 2022-06-30 DIAGNOSIS — E559 Vitamin D deficiency, unspecified: Secondary | ICD-10-CM | POA: Diagnosis not present

## 2022-06-30 DIAGNOSIS — R7303 Prediabetes: Secondary | ICD-10-CM | POA: Diagnosis not present

## 2022-06-30 DIAGNOSIS — R635 Abnormal weight gain: Secondary | ICD-10-CM | POA: Diagnosis not present

## 2022-07-03 DIAGNOSIS — Z79899 Other long term (current) drug therapy: Secondary | ICD-10-CM | POA: Diagnosis not present

## 2022-07-08 ENCOUNTER — Telehealth: Payer: Self-pay | Admitting: Adult Health

## 2022-07-08 NOTE — Telephone Encounter (Signed)
Pt is calling and has a follow up question pertaining to her last appt. Pt did not want to elaborate

## 2022-07-09 ENCOUNTER — Encounter: Payer: Self-pay | Admitting: Adult Health

## 2022-07-09 ENCOUNTER — Ambulatory Visit (INDEPENDENT_AMBULATORY_CARE_PROVIDER_SITE_OTHER): Payer: Medicaid Other | Admitting: Adult Health

## 2022-07-09 VITALS — BP 120/70 | HR 79 | Temp 97.7°F | Ht 73.0 in | Wt >= 6400 oz

## 2022-07-09 DIAGNOSIS — J020 Streptococcal pharyngitis: Secondary | ICD-10-CM | POA: Diagnosis not present

## 2022-07-09 DIAGNOSIS — F439 Reaction to severe stress, unspecified: Secondary | ICD-10-CM | POA: Diagnosis not present

## 2022-07-09 LAB — POCT RAPID STREP A (OFFICE): Rapid Strep A Screen: POSITIVE — AB

## 2022-07-09 MED ORDER — PENICILLIN V POTASSIUM 500 MG PO TABS: 500.0000 mg | ORAL_TABLET | Freq: Three times a day (TID) | ORAL | 0 refills | Status: AC

## 2022-07-09 NOTE — Progress Notes (Signed)
Subjective:    Patient ID: Maureen Lewis, female    DOB: 09-Apr-1996, 26 y.o.   MRN: [id number]  HPI  26 year old female who  has a past medical history of Abnormal uterine bleeding, Acid reflux, Alpha thalassemia trait, Amenorrhea, Anemia, Asthma, Back pain, Cesarean delivery delivered (10/11/2019), Chest pain, Complication of anesthesia, Constipation, Depression, Fibromyalgia, Finger mass, left (03/2017), Gestational diabetes (05/23/2019), Gestational hypertension (09/23/2019), Headache, Hypertension, Infection, Irritable bowel syndrome (IBS), Joint pain, Morbid obesity with body mass index (BMI) of 45.0 to 49.9 in adult [facility]), Obesity during pregnancy, antepartum (04/06/2019), Plantar fasciitis, bilateral, Polycystic ovary syndrome, Pre-diabetes, Prediabetes, Pregnancy induced hypertension, Shortness of breath, Sleep apnea, Supervision of normal first pregnancy (04/06/2019), and Vertigo (2017).  She presents to the office today for follow up regarding sore throat.  She was seen 2 weeks ago with this issue at which time she was having pain with swallowing.  Her strep was negative at this time and she was given a Medrol Dosepak to help with symptoms of viral sore throat.  She reports that she took the prednisone, this helped minimally and then once she was done with that the symptoms came back.  She feels as though symptoms are worse and that her throat "feels like it is on fire".  Is painful with swallowing, talking, and at rest.  She denies any fevers or chills.  At home she has drinking warm liquids without improvement     Review of Systems See HPI   Past Medical History:  Diagnosis Date   Abnormal uterine bleeding    Acid reflux    Alpha thalassemia trait    Amenorrhea    Anemia    no current med.   Asthma    Back pain    Cesarean delivery delivered 10/11/2019   10/09/2019 - primary CS for failed IOL   Chest pain    resolved   Complication of anesthesia    states had  to keep giving her anesthesia during EGD   Constipation    Depression    "I'm good"   Fibromyalgia    Finger mass, left 03/2017   middle finger   Gestational diabetes 05/23/2019   hx - with pregnancy only    Gestational hypertension 09/23/2019   Guidelines for Antenatal Testing and Sonography  (with updated ICD-10 codes)  Updated  09/27/19 with Dr. Tama High  INDICATION U/S 2 X week NST/AFI  or full BPP wkly DELIVERY Diabetes   A1 - good control - O24.410    A2 - good control - O24.419      A2  - poor control or poor compliance - O24.419, E11.65   (Macrosomia or polyhydramnios) **E11.65 is extra code for poor control**    A2/B - O24.   Headache    Hypertension    Infection    UTI   Irritable bowel syndrome (IBS)    Joint pain    Morbid obesity with body mass index (BMI) of 45.0 to 49.9 in adult [facility])    Obesity during pregnancy, antepartum 04/06/2019   Body mass index is 53.71 kg/m.  Recommendations '[x]'$  Aspirin 81 mg daily after 12 weeks; discontinue after 36 weeks '[ ]'$  Nutrition consult '[ ]'$  Weight gain 11-20 lbs for singleton and 25-35 lbs for twin pregnancy (IOM guidelines) Higher class of obesity patients recommended to gain closer to lower limit  Weight loss is associated with adverse outcomes '[ ]'$  Baseline and surveillance labs (pulled in fr   Plantar  fasciitis, bilateral    resolved   Polycystic ovary syndrome    Pre-diabetes    no meds, diet controlled, diet not check blood sugar   Prediabetes    "prediabetes"   Pregnancy induced hypertension    Shortness of breath    albuterol inhaler   Sleep apnea    no CPAP use   Supervision of normal first pregnancy 04/06/2019   [name]: [ x]Initial [ x]12 '[ ]'$ 20 '[ ]'$ 28 '[ ]'$ 32 '[ ]'$ 36 '[ ]'$ 38 '[ ]'$ 39 '[ ]'$ 40 Nursing Staff Provider Office Location [facility]  Dating   LMP Language  English  Anatomy US  Nml Flu Vaccine  Declined 07/28/19 Genetic Screen  NIPS: low risks   AFP:   negative  TDaP vaccine   08/25/19 Hgb A1C or  GTT Early A1C  5.8 Third trimester: GDM insulin Rhogam  NA   LAB RESULTS  Feeding Plan Breast Blood Type   Vertigo 2017    Social History   Socioeconomic History   Marital status: Single    Spouse name: Not on file   Number of children: Not on file   Years of education: Not on file   Highest education level: Not on file  Occupational History   Occupation: Chemical engineer    Employer: [id number]  Tobacco Use   Smoking status: Never   Smokeless tobacco: Never  Vaping Use   Vaping Use: Never used  Substance and Sexual Activity   Alcohol use: Not Currently    Comment: social    Drug use: No   Sexual activity: Yes    Birth control/protection: I.U.D.  Other Topics Concern   Not on file  Social History Narrative   Right handed    Soda sometimes   Lives with mom and cousin, a grandmother in [place] also helps care for her.       She is in nursing school.    Social Determinants of Health   Financial Resource Strain: Not on file  Food Insecurity: Not on file  Transportation Needs: Not on file  Physical Activity: Not on file  Stress: Not on file  Social Connections: Not on file  Intimate Partner Violence: Not on file    Past Surgical History:  Procedure Laterality Date   CESAREAN SECTION N/A 10/09/2019   Procedure: CESAREAN SECTION;  Surgeon: Aletha Halim, MD;  [facility];  Service: Obstetrics;  Laterality: N/A;   COLONOSCOPY WITH PROPOFOL  10/07/2016   DILATION AND CURETTAGE OF UTERUS     ESOPHAGOGASTRODUODENOSCOPY  12/21/2015   EXCISION MASS UPPER EXTREMETIES Left 04/21/2017   Procedure: EXCISION MASS LEFT MIDDLE FINGER;  Surgeon: Daryll Brod, MD;  Location: [place];  Service: Orthopedics;  Laterality: Left;   gastric sleeve     july 31   IR FL GUIDED LOC OF NEEDLE/CATH TIP FOR SPINAL INJECTION RT  03/13/2020   PHOTOCOAGULATION WITH LASER Left 12/13/2020   Procedure: RETINOPLEXY LEFT EYE;  Surgeon: Sherlynn Stalls, MD;  Location: [place];  Service:  Ophthalmology;  Laterality: Left;   SCLERAL BUCKLE Right 12/13/2020   Procedure: SCLERAL BUCKLE WITH CRYO;  Surgeon: Sherlynn Stalls, MD;  Location: [place];  Service: Ophthalmology;  Laterality: Right;    Family History  Problem Relation Age of Onset   Diabetes Maternal Aunt    Hypertension Maternal Uncle    Depression Maternal Grandfather    Diabetes Maternal Grandfather    Hypertension Maternal Grandfather    Arthritis Mother    High blood pressure  Mother    Depression Mother    Sleep apnea Mother    Obesity Mother    Diabetes Mother    Hypertension Mother    Kidney failure Mother    Sleep apnea Father    Obesity Father    Healthy Daughter     Allergies  Allergen Reactions   Bee Pollen Hives, Shortness Of Breath and Swelling   Bee Venom Hives, Shortness Of Breath and Swelling   Hydrocodone-Acetaminophen Anaphylaxis    No problem when she takes Tylenol   Peach Flavor Hives, Shortness Of Breath and Other (See Comments)    SWELLING OF MOUTH   Pollen Extract Hives, Shortness Of Breath and Swelling   Remdesivir Swelling    Angioedema 8/25   Latex Rash   Cortisone Itching and Swelling   Cortizone-10 [Hydrocortisone]     Swelling and itching    Current Outpatient Medications on File Prior to Visit  Medication Sig Dispense Refill   acetaZOLAMIDE ER (DIAMOX) 500 MG capsule Take by mouth.     Blood Pressure Monitoring (BLOOD PRESSURE MON/AUTO/WRIST) DEVI Use to monitor blood pressure 1 each 0   chlorhexidine (PERIDEX) 0.12 % solution SMARTSIG:By Mouth     dicyclomine (BENTYL) 10 MG capsule dicyclomine 10 mg capsule     Diethylpropion HCl CR 75 MG TB24 Take by mouth.     fluticasone-salmeterol (ADVAIR HFA) 115-21 MCG/ACT inhaler Inhale 2 puffs into the lungs 2 (two) times daily. 3 each 3   levonorgestrel (KYLEENA) 19.5 MG IUD      loratadine (CLARITIN) 10 MG tablet Take 10 mg by mouth daily as needed for allergies.      methylPREDNISolone (MEDROL DOSEPAK) 4 MG TBPK tablet  Take as directed 21 tablet 0   nortriptyline (PAMELOR) 25 MG capsule Take 1 capsule (25 mg total) by mouth at bedtime. 30 capsule 2   ondansetron (ZOFRAN-ODT) 8 MG disintegrating tablet ondansetron 8 mg disintegrating tablet     oxyCODONE-acetaminophen (PERCOCET/ROXICET) 5-325 MG tablet Take 1 tablet by mouth 5 (five) times daily.     pantoprazole (PROTONIX) 40 MG tablet Take 1 tablet (40 mg total) by mouth daily. 90 tablet 0   tiZANidine (ZANAFLEX) 4 MG tablet Take 4 mg by mouth 3 (three) times daily as needed.     topiramate (TOPAMAX) 50 MG tablet Take by mouth.     Vitamin D, Ergocalciferol, (DRISDOL) 1.25 MG (50000 UNIT) CAPS capsule Take 1 capsule (50,000 Units total) by mouth every 7 (seven) days. (Patient taking differently: Take 50,000 Units by mouth every Monday.) 4 capsule 0   ferrous sulfate 325 (65 FE) MG tablet Take 1 tablet (325 mg total) by mouth every other day. 45 tablet 1   No current facility-administered medications on file prior to visit.    BP 120/70   Pulse 79   Temp 97.7 F (36.5 C) (Oral)   Ht '6\' 1"'$  (1.854 m)   Wt (!) 435 lb (197.3 kg)   SpO2 97%   BMI 57.39 kg/m       Objective:   Physical Exam Vitals and nursing note reviewed.  Constitutional:      Appearance: Normal appearance.  HENT:     Mouth/Throat:     Mouth: Mucous membranes are moist.     Pharynx: Posterior oropharyngeal erythema present. No oropharyngeal exudate.     Tonsils: Tonsillar exudate present.  Cardiovascular:     Rate and Rhythm: Normal rate and regular rhythm.     Pulses: Normal pulses.  Heart sounds: Normal heart sounds.  Pulmonary:     Effort: Pulmonary effort is normal.     Breath sounds: Normal breath sounds.  Musculoskeletal:        General: Normal range of motion.  Lymphadenopathy:     Head:     Right side of head: Tonsillar and preauricular adenopathy present. No submental adenopathy.     Left side of head: Submental, tonsillar and preauricular adenopathy present.   Skin:    General: Skin is warm and dry.     Capillary Refill: Capillary refill takes less than 2 seconds.  Neurological:     General: No focal deficit present.     Mental Status: She is alert and oriented to person, place, and time.  Psychiatric:        Mood and Affect: Mood normal.        Behavior: Behavior normal.        Thought Content: Thought content normal.        Judgment: Judgment normal.       Assessment & Plan:  1. Strep throat  - penicillin v potassium (VEETID) 500 MG tablet; Take 1 tablet (500 mg total) by mouth 3 (three) times daily for 10 days.  Dispense: 30 tablet; Refill: 0 - POC Rapid Strep A- positive  Dorothyann Peng, NP

## 2022-07-09 NOTE — Telephone Encounter (Signed)
Pt has been scheduled.  °

## 2022-07-10 ENCOUNTER — Telehealth: Payer: Self-pay | Admitting: Adult Health

## 2022-07-10 NOTE — Telephone Encounter (Signed)
Pt was seen in office yesterday and would like a call back to go over some results she saw on Mychart. Pt is stating she is still not clear if she tested positive or negative, because she is seeing negative in one place and positive in another.

## 2022-07-10 NOTE — Telephone Encounter (Signed)
This has been taking care of.

## 2022-07-14 ENCOUNTER — Telehealth (HOSPITAL_BASED_OUTPATIENT_CLINIC_OR_DEPARTMENT_OTHER): Payer: Self-pay | Admitting: Physical Therapy

## 2022-07-14 ENCOUNTER — Ambulatory Visit (HOSPITAL_BASED_OUTPATIENT_CLINIC_OR_DEPARTMENT_OTHER): Payer: Medicaid Other | Attending: Physical Therapy | Admitting: Physical Therapy

## 2022-07-14 NOTE — Telephone Encounter (Signed)
Pt states she has forgotten about appt today.  She will call front desk to re schedule. She is informed of cancellation and no show policy.

## 2022-07-15 ENCOUNTER — Encounter: Payer: Medicaid Other | Admitting: Adult Health

## 2022-07-15 ENCOUNTER — Ambulatory Visit (INDEPENDENT_AMBULATORY_CARE_PROVIDER_SITE_OTHER): Payer: Medicaid Other | Admitting: Adult Health

## 2022-07-15 VITALS — BP 122/84 | HR 82 | Temp 98.0°F | Ht 72.5 in | Wt >= 6400 oz

## 2022-07-15 DIAGNOSIS — Z6841 Body Mass Index (BMI) 40.0 and over, adult: Secondary | ICD-10-CM | POA: Diagnosis not present

## 2022-07-15 DIAGNOSIS — Z0000 Encounter for general adult medical examination without abnormal findings: Secondary | ICD-10-CM

## 2022-07-15 DIAGNOSIS — J020 Streptococcal pharyngitis: Secondary | ICD-10-CM

## 2022-07-15 DIAGNOSIS — Z111 Encounter for screening for respiratory tuberculosis: Secondary | ICD-10-CM

## 2022-07-15 DIAGNOSIS — R519 Headache, unspecified: Secondary | ICD-10-CM | POA: Diagnosis not present

## 2022-07-15 DIAGNOSIS — K219 Gastro-esophageal reflux disease without esophagitis: Secondary | ICD-10-CM | POA: Diagnosis not present

## 2022-07-15 DIAGNOSIS — I10 Essential (primary) hypertension: Secondary | ICD-10-CM

## 2022-07-15 DIAGNOSIS — R7303 Prediabetes: Secondary | ICD-10-CM

## 2022-07-15 DIAGNOSIS — Z0184 Encounter for antibody response examination: Secondary | ICD-10-CM | POA: Diagnosis not present

## 2022-07-15 DIAGNOSIS — Z903 Acquired absence of stomach [part of]: Secondary | ICD-10-CM | POA: Diagnosis not present

## 2022-07-15 DIAGNOSIS — Z23 Encounter for immunization: Secondary | ICD-10-CM

## 2022-07-15 DIAGNOSIS — E8881 Metabolic syndrome: Secondary | ICD-10-CM | POA: Diagnosis not present

## 2022-07-15 MED ORDER — MAGIC MOUTHWASH W/LIDOCAINE
5.0000 mL | Freq: Three times a day (TID) | ORAL | 0 refills | Status: DC | PRN
Start: 2022-07-15 — End: 2022-07-29

## 2022-07-15 MED ORDER — METHYLPREDNISOLONE 4 MG PO TBPK: ORAL_TABLET | ORAL | 0 refills | Status: DC

## 2022-07-15 MED ORDER — FLUCONAZOLE 150 MG PO TABS: ORAL_TABLET | ORAL | 0 refills | Status: DC

## 2022-07-15 NOTE — Progress Notes (Signed)
Erroneous entry

## 2022-07-15 NOTE — Patient Instructions (Signed)
It was great seeing you today   Please schedule a nursing visit on Friday to have your TB test read

## 2022-07-15 NOTE — Progress Notes (Deleted)
   Subjective:    Patient ID: Maureen Lewis, female    DOB: [birth year], 26 y.o.   MRN: [id number]  HPI Patient presents for yearly preventative medicine examination. She is a pleasant 26 year old female who  has a past medical history of Abnormal uterine bleeding, Acid reflux, Alpha thalassemia trait, Amenorrhea, Anemia, Asthma, Back pain, Cesarean delivery delivered (10/11/2019), Chest pain, Complication of anesthesia, Constipation, Depression, Fibromyalgia, Finger mass, left (03/2017), Gestational diabetes (05/23/2019), Gestational hypertension (09/23/2019), Headache, Hypertension, Infection, Irritable bowel syndrome (IBS), Joint pain, Morbid obesity with body mass index (BMI) of 45.0 to 49.9 in adult [facility]), Obesity during pregnancy, antepartum (04/06/2019), Plantar fasciitis, bilateral, Polycystic ovary syndrome, Pre-diabetes, Prediabetes, Pregnancy induced hypertension, Shortness of breath, Sleep apnea, Supervision of normal first pregnancy (04/06/2019), and Vertigo (2017).  History of Bariatric Surgery -status post VSG about 2 months ago.  She has been doing well overall and has been able to lose significant weight.  She has having a hard time with getting enough protein in her diet.  She has started working out with a Physiological scientist.  She was able to come off medication for hypertension and diabetes.  Her last A1c done at [facility] roughly 2 months ago was 6.1.  Migraine headaches-managed with   Acid reflux-managed with Protonix 40 mg daily   All immunizations and health maintenance protocols were reviewed with the patient and needed orders were placed.  Appropriate screening laboratory values were ordered for the patient including screening of hyperlipidemia, renal function and hepatic function. If indicated by BPH, a PSA was ordered.  Medication reconciliation,  past medical history, social history, problem list and allergies were reviewed in detail with the  patient  Goals were established with regard to weight loss, exercise, and  diet in compliance with medications  End of life planning was discussed.    Review of Systems     Objective:   Physical Exam        Assessment & Plan:

## 2022-07-15 NOTE — Progress Notes (Signed)
Subjective:    Patient ID: Maureen Lewis, female    DOB: [birth year], 26 y.o.   MRN: [id number]  HPI Patient presents for yearly preventative medicine examination. She is a pleasant 26 year old female who  has a past medical history of Abnormal uterine bleeding, Acid reflux, Alpha thalassemia trait, Amenorrhea, Anemia, Asthma, Back pain, Cesarean delivery delivered (10/11/2019), Chest pain, Complication of anesthesia, Constipation, Depression, Fibromyalgia, Finger mass, left (03/2017), Gestational diabetes (05/23/2019), Gestational hypertension (09/23/2019), Headache, Hypertension, Infection, Irritable bowel syndrome (IBS), Joint pain, Morbid obesity with body mass index (BMI) of 45.0 to 49.9 in adult [facility]), Obesity during pregnancy, antepartum (04/06/2019), Plantar fasciitis, bilateral, Polycystic ovary syndrome, Pre-diabetes, Prediabetes, Pregnancy induced hypertension, Shortness of breath, Sleep apnea, Supervision of normal first pregnancy (04/06/2019), and Vertigo (2017).  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   History of Bariatric Surgery -status post VSG about 2 months ago.  She has lost about 56 pounds since the surgery.  Overall she has been doing well.  She has started working with a Physiological scientist.  Since to the gastric bypass she has been able to come off medication for hypertension and prediabetes.  Her last A1c was done in roughly a month and a half ago and was at 6.1  Migraine headaches-managed  by pain management with topamax 50 mg daily.  She does not feel as though her migraines are well controlled and she plans to bring this up next time she sees pain management  Acid reflux-managed with Protonix 40 mg daily. Feels controlled.   Strep Throat-seen last week and had a positive strep, she is feeling much better but continues to have a slightly sore throat.  She has 2 days of antibiotics left.  She reports that since starting antibiotics she is also developed  a yeast infection   All immunizations and health maintenance protocols were reviewed with the patient and needed orders were placed. She will get a flu shot today   Appropriate screening laboratory values were ordered for the patient including screening of hyperlipidemia, renal function and hepatic function.   Medication reconciliation,  past medical history, social history, problem list and allergies were reviewed in detail with the patient  Goals were established with regard to weight loss, exercise, and  diet in compliance with medications Wt Readings from Last 10 Encounters:  07/15/22 (!) 431 lb 6.4 oz (195.7 kg)  07/09/22 (!) 435 lb (197.3 kg)  06/26/22 (!) 446 lb (202.3 kg)  05/23/22 (!) 466 lb (211.4 kg)  05/02/22 (!) 487 lb (220.9 kg)  05/01/22 (!) 483 lb (219.1 kg)  04/24/22 (!) 485 lb (220 kg)  03/27/22 (!) 487 lb 3.2 oz (221 kg)  03/26/22 (!) 492 lb 8 oz (223.4 kg)  02/26/22 (!) 512 lb 3.2 oz (232.3 kg)   Review of Systems  Constitutional: Negative.   HENT: Negative.    Eyes: Negative.   Respiratory: Negative.    Cardiovascular: Negative.   Gastrointestinal: Negative.   Endocrine: Negative.   Genitourinary: Negative.   Musculoskeletal: Negative.   Skin: Negative.   Allergic/Immunologic: Negative.   Neurological:  Positive for headaches.  Hematological: Negative.   Psychiatric/Behavioral: Negative.         Objective:   Physical Exam Vitals and nursing note reviewed.  Constitutional:      General: She is not in acute distress.    Appearance: Normal appearance. She is well-developed. She is obese. She is not ill-appearing.  HENT:  Head: Normocephalic and atraumatic.     Right Ear: Tympanic membrane, ear canal and external ear normal. There is no impacted cerumen.     Left Ear: Tympanic membrane, ear canal and external ear normal. There is no impacted cerumen.     Nose: Nose normal. No congestion or rhinorrhea.     Mouth/Throat:     Mouth: Mucous  membranes are moist.     Pharynx: Oropharynx is clear. No oropharyngeal exudate or posterior oropharyngeal erythema.  Eyes:     General:        Right eye: No discharge.        Left eye: No discharge.     Extraocular Movements: Extraocular movements intact.     Conjunctiva/sclera: Conjunctivae normal.     Pupils: Pupils are equal, round, and reactive to light.  Neck:     Thyroid: No thyromegaly.     Vascular: No carotid bruit.     Trachea: No tracheal deviation.  Cardiovascular:     Rate and Rhythm: Normal rate and regular rhythm.     Pulses: Normal pulses.     Heart sounds: Normal heart sounds. No murmur heard.    No friction rub. No gallop.  Pulmonary:     Effort: Pulmonary effort is normal. No respiratory distress.     Breath sounds: Normal breath sounds. No stridor. No wheezing, rhonchi or rales.  Chest:     Chest wall: No tenderness.  Abdominal:     General: Abdomen is flat. Bowel sounds are normal. There is no distension.     Palpations: Abdomen is soft. There is no mass.     Tenderness: There is no abdominal tenderness. There is no right CVA tenderness, left CVA tenderness, guarding or rebound.     Hernia: No hernia is present.  Musculoskeletal:        General: No swelling, tenderness, deformity or signs of injury. Normal range of motion.     Cervical back: Normal range of motion and neck supple.     Right lower leg: No edema.     Left lower leg: No edema.  Lymphadenopathy:     Cervical: No cervical adenopathy.  Skin:    General: Skin is warm and dry.     Capillary Refill: Capillary refill takes less than 2 seconds.     Coloration: Skin is not jaundiced or pale.     Findings: No bruising, erythema, lesion or rash.  Neurological:     General: No focal deficit present.     Mental Status: She is alert and oriented to person, place, and time.     Cranial Nerves: No cranial nerve deficit.     Sensory: No sensory deficit.     Motor: No weakness.     Coordination:  Coordination normal.     Gait: Gait normal.     Deep Tendon Reflexes: Reflexes normal.  Psychiatric:        Mood and Affect: Mood normal.        Behavior: Behavior normal.        Thought Content: Thought content normal.        Judgment: Judgment normal.       Assessment & Plan:  1. Routine general medical examination at a health care facility - All of her labs have been drawn recently.  - Continue to eat healthy and exercise - Follow up in one year or sooner if needed  2. S/P gastric sleeve procedure - Follow up with weight loss clinic as  directed  3. Strep throat - No signs of strep throat any logner.  - magic mouthwash w/lidocaine SOLN; Take 5 mLs by mouth 3 (three) times daily as needed (gargle and spit).  Dispense: 180 mL; Refill: 0 - fluconazole (DIFLUCAN) 150 MG tablet; Take one tablet and then 3 days later take the second tab if needed  Dispense: 2 tablet; Refill: 0 - methylPREDNISolone (MEDROL DOSEPAK) 4 MG TBPK tablet; Take as directed  Dispense: 21 tablet; Refill: 0  4. Influenza vaccine needed  - Flu Vaccine QUAD 6+ mos PF IM (Fluarix Quad PF)  5. Screening for tuberculosis  - PPD  6. Essential hypertension - Controlled without medications   7. Acute nonintractable headache, unspecified headache type - Continue with Topamax. Follow up with pain management    8. Metabolic syndrome - Improving with weight loss  9. Gastroesophageal reflux disease, unspecified whether esophagitis present - Continue with Protonix 40 mg daily   10. Prediabetes - Resolving with weight loss  11. Immunity status testing  - Measles/Mumps/Rubella Immunity; Future - Varicella Zoster Antibody, IgG; Future - Hep B Surface Antibody; Future  Dorothyann Peng, NP

## 2022-07-16 ENCOUNTER — Encounter: Payer: Medicaid Other | Admitting: Adult Health

## 2022-07-17 ENCOUNTER — Other Ambulatory Visit: Payer: Medicaid Other

## 2022-07-17 ENCOUNTER — Ambulatory Visit: Payer: Medicaid Other

## 2022-07-17 DIAGNOSIS — M25562 Pain in left knee: Secondary | ICD-10-CM | POA: Diagnosis not present

## 2022-07-18 ENCOUNTER — Ambulatory Visit: Payer: Medicaid Other

## 2022-07-18 ENCOUNTER — Other Ambulatory Visit: Payer: Medicaid Other

## 2022-07-18 DIAGNOSIS — Z0184 Encounter for antibody response examination: Secondary | ICD-10-CM | POA: Diagnosis not present

## 2022-07-18 DIAGNOSIS — K759 Inflammatory liver disease, unspecified: Secondary | ICD-10-CM

## 2022-07-18 HISTORY — DX: Inflammatory liver disease, unspecified: K75.9

## 2022-07-18 LAB — TB SKIN TEST
Induration: 0 mm
TB Skin Test: NEGATIVE

## 2022-07-18 NOTE — Progress Notes (Signed)
PPD Reading Note  PPD read and results entered in EpicCare.  Result: 0 mm induration.  Interpretation: negative  If test not read within 48-72 hours of initial placement, patient advised to repeat in other arm 1-3 weeks after this test.  Allergic reaction: no

## 2022-07-21 LAB — HEPATITIS B SURFACE ANTIBODY,QUALITATIVE: Hep B S Ab: REACTIVE — AB

## 2022-07-21 LAB — MEASLES/MUMPS/RUBELLA IMMUNITY
Mumps IgG: 35.7 AU/mL
Rubella: 12.3 Index
Rubeola IgG: 292 AU/mL

## 2022-07-21 LAB — VARICELLA ZOSTER ANTIBODY, IGG: Varicella IgG: <135 index — ABNORMAL LOW

## 2022-07-23 ENCOUNTER — Telehealth: Payer: Self-pay

## 2022-07-23 NOTE — Telephone Encounter (Signed)
Call patient. Inform her, we need her signature on the back of the school form. Once she sign it, we can make a copy of it and she can keep the original.  Patient verbalized understanding.

## 2022-07-28 DIAGNOSIS — M25562 Pain in left knee: Secondary | ICD-10-CM | POA: Diagnosis not present

## 2022-07-29 ENCOUNTER — Ambulatory Visit (INDEPENDENT_AMBULATORY_CARE_PROVIDER_SITE_OTHER): Payer: Medicaid Other | Admitting: Adult Health

## 2022-07-29 ENCOUNTER — Other Ambulatory Visit (HOSPITAL_COMMUNITY)
Admission: RE | Admit: 2022-07-29 | Discharge: 2022-07-29 | Disposition: A | Discharge status: Home / Self Care | Payer: Medicaid Other | Source: Ambulatory Visit | Attending: Adult Health | Admitting: Adult Health

## 2022-07-29 VITALS — BP 116/86 | HR 88 | Temp 98.0°F | Wt >= 6400 oz

## 2022-07-29 DIAGNOSIS — N898 Other specified noninflammatory disorders of vagina: Secondary | ICD-10-CM | POA: Diagnosis not present

## 2022-07-29 DIAGNOSIS — J029 Acute pharyngitis, unspecified: Secondary | ICD-10-CM | POA: Diagnosis not present

## 2022-07-29 MED ORDER — MAGIC MOUTHWASH W/LIDOCAINE: 5.0000 mL | Freq: Three times a day (TID) | ORAL | 0 refills | Status: DC | PRN

## 2022-07-29 MED ORDER — METRONIDAZOLE 500 MG PO TABS: 500.0000 mg | ORAL_TABLET | Freq: Three times a day (TID) | ORAL | 0 refills | Status: AC

## 2022-07-29 NOTE — Progress Notes (Unsigned)
Subjective:    Patient ID: Maureen Lewis, female    DOB: 1996/05/16, 26 y.o.   MRN: [id number]  HPI 25 year old female who  has a past medical history of Abnormal uterine bleeding, Acid reflux, Alpha thalassemia trait, Amenorrhea, Anemia, Asthma, Back pain, Cesarean delivery delivered (10/11/2019), Chest pain, Complication of anesthesia, Constipation, Depression, Fibromyalgia, Finger mass, left (03/2017), Gestational diabetes (05/23/2019), Gestational hypertension (09/23/2019), Headache, Hypertension, Infection, Irritable bowel syndrome (IBS), Joint pain, Morbid obesity with body mass index (BMI) of 45.0 to 49.9 in adult [facility]), Obesity during pregnancy, antepartum (04/06/2019), Plantar fasciitis, bilateral, Polycystic ovary syndrome, Pre-diabetes, Prediabetes, Pregnancy induced hypertension, Shortness of breath, Sleep apnea, Supervision of normal first pregnancy (04/06/2019), and Vertigo (2017).  She presents to the office today for continued sore throat. She was diagnosed with strep throat on 07/09/2022 and prescribed a course of Pen V K. She was seen two weeks ago and reported that her sore throat was much improved but continues to be slightly sore. There was no signs of strep and she had two days of antibiotics left.I sent in a medrol dose pack and magic mouth wash. She reports that the soreness went away until she finished the steroids.   Today she reports that she continues to have a sore throat but it feels like there is " gristle in my throat" She reports coughing up a semi hard gray piece of material. Reports feeling as though she is getting choked up from time to time when eating.   During her last visit she also reports having a yeast infection and was prescribed diflucan. Today she reports that she continues to have irritation and burning around her vagina with clearish/white vaginal discharge. She has not noticed any odor and is not concerned for STDs.   Review of Systems See  HPI   Past Medical History:  Diagnosis Date   Abnormal uterine bleeding    Acid reflux    Alpha thalassemia trait    Amenorrhea    Anemia    no current med.   Asthma    Back pain    Cesarean delivery delivered 10/11/2019   10/09/2019 - primary CS for failed IOL   Chest pain    resolved   Complication of anesthesia    states had to keep giving her anesthesia during EGD   Constipation    Depression    "I'm good"   Fibromyalgia    Finger mass, left 03/2017   middle finger   Gestational diabetes 05/23/2019   hx - with pregnancy only    Gestational hypertension 09/23/2019   Guidelines for Antenatal Testing and Sonography  (with updated ICD-10 codes)  Updated  10-07-2019 with Dr. Tama High  INDICATION U/S 2 X week NST/AFI  or full BPP wkly DELIVERY Diabetes   A1 - good control - O24.410    A2 - good control - O24.419      A2  - poor control or poor compliance - O24.419, E11.65   (Macrosomia or polyhydramnios) **E11.65 is extra code for poor control**    A2/B - O24.   Headache    Hypertension    Infection    UTI   Irritable bowel syndrome (IBS)    Joint pain    Morbid obesity with body mass index (BMI) of 45.0 to 49.9 in adult [facility])    Obesity during pregnancy, antepartum 04/06/2019   Body mass index is 53.71 kg/m.  Recommendations '[x]'$  Aspirin 81 mg daily after 12 weeks;  discontinue after 36 weeks '[ ]'$  Nutrition consult '[ ]'$  Weight gain 11-20 lbs for singleton and 25-35 lbs for twin pregnancy (IOM guidelines) Higher class of obesity patients recommended to gain closer to lower limit  Weight loss is associated with adverse outcomes '[ ]'$  Baseline and surveillance labs (pulled in fr   Plantar fasciitis, bilateral    resolved   Polycystic ovary syndrome    Pre-diabetes    no meds, diet controlled, diet not check blood sugar   Prediabetes    "prediabetes"   Pregnancy induced hypertension    Shortness of breath    albuterol inhaler   Sleep apnea    no CPAP use   Supervision of  normal first pregnancy 04/06/2019   [name]: [ x]Initial [ x]12 '[ ]'$ 20 '[ ]'$ 28 '[ ]'$ 32 '[ ]'$ 36 '[ ]'$ 38 '[ ]'$ 39 '[ ]'$ 40 Nursing Staff Provider Office Location [facility]  Dating   LMP Language  English  Anatomy US  Nml Flu Vaccine  Declined 07/28/19 Genetic Screen  NIPS: low risks   AFP:   negative  TDaP vaccine   08/25/19 Hgb A1C or  GTT Early A1C 5.8 Third trimester: GDM insulin Rhogam  NA   LAB RESULTS  Feeding Plan Breast Blood Type   Vertigo 2017    Social History   Socioeconomic History   Marital status: Single    Spouse name: Not on file   Number of children: Not on file   Years of education: Not on file   Highest education level: Not on file  Occupational History   Occupation: Chemical engineer    Employer: [id number]  Tobacco Use   Smoking status: Never   Smokeless tobacco: Never  Vaping Use   Vaping Use: Never used  Substance and Sexual Activity   Alcohol use: Not Currently    Comment: social    Drug use: No   Sexual activity: Yes    Birth control/protection: I.U.D.  Other Topics Concern   Not on file  Social History Narrative   Right handed    Soda sometimes   Lives with mom and cousin, a grandmother in [place] also helps care for her.       She is in nursing school.    Social Determinants of Health   Financial Resource Strain: Not on file  Food Insecurity: Not on file  Transportation Needs: Not on file  Physical Activity: Not on file  Stress: Not on file  Social Connections: Not on file  Intimate Partner Violence: Not on file    Past Surgical History:  Procedure Laterality Date   CESAREAN SECTION N/A 10/09/2019   Procedure: CESAREAN SECTION;  Surgeon: Aletha Halim, MD;  Location: [facility];  Service: Obstetrics;  Laterality: N/A;   COLONOSCOPY WITH PROPOFOL  10/07/2016   DILATION AND CURETTAGE OF UTERUS     ESOPHAGOGASTRODUODENOSCOPY  12/21/2015   EXCISION MASS UPPER EXTREMETIES Left 04/21/2017   Procedure: EXCISION MASS LEFT MIDDLE FINGER;  Surgeon:  Daryll Brod, MD;  Location: [place];  Service: Orthopedics;  Laterality: Left;   gastric sleeve     july 31   IR FL GUIDED LOC OF NEEDLE/CATH TIP FOR SPINAL INJECTION RT  03/13/2020   PHOTOCOAGULATION WITH LASER Left 12/13/2020   Procedure: RETINOPLEXY LEFT EYE;  Surgeon: Sherlynn Stalls, MD;  Location: [place];  Service: Ophthalmology;  Laterality: Left;   SCLERAL BUCKLE Right 12/13/2020   Procedure: SCLERAL BUCKLE WITH CRYO;  Surgeon: Sherlynn Stalls, MD;  Location: [place];  Service: Ophthalmology;  Laterality: Right;    Family History  Problem Relation Age of Onset   Diabetes Maternal Aunt    Hypertension Maternal Uncle    Depression Maternal Grandfather    Diabetes Maternal Grandfather    Hypertension Maternal Grandfather    Arthritis Mother    High blood pressure Mother    Depression Mother    Sleep apnea Mother    Obesity Mother    Diabetes Mother    Hypertension Mother    Kidney failure Mother    Sleep apnea Father    Obesity Father    Healthy Daughter     Allergies  Allergen Reactions   Bee Pollen Hives, Shortness Of Breath and Swelling   Bee Venom Hives, Shortness Of Breath and Swelling   Hydrocodone-Acetaminophen Anaphylaxis    No problem when she takes Tylenol   Peach Flavor Hives, Shortness Of Breath and Other (See Comments)    SWELLING OF MOUTH   Pollen Extract Hives, Shortness Of Breath and Swelling   Remdesivir Swelling    Angioedema 8/25   Latex Rash   Cortisone Itching and Swelling   Cortizone-10 [Hydrocortisone]     Swelling and itching    Current Outpatient Medications on File Prior to Visit  Medication Sig Dispense Refill   ALPRAZolam (XANAX) 0.5 MG tablet daily as needed.     aprepitant (EMEND) 40 MG capsule Take 40 mg by mouth as needed.     Blood Pressure Monitoring (BLOOD PRESSURE MON/AUTO/WRIST) DEVI Use to monitor blood pressure 1 each 0   Diethylpropion HCl CR 75 MG TB24 Take by mouth.     fluconazole (DIFLUCAN) 150 MG  tablet Take one tablet and then 3 days later take the second tab if needed 2 tablet 0   ibuprofen (ADVIL) 800 MG tablet Take 1 tablet by mouth every 8 (eight) hours as needed.     levonorgestrel (MIRENA, 52 MG,) 20 MCG/DAY IUD Take 1 device by intrauterine route.     loratadine (CLARITIN) 10 MG tablet Take 10 mg by mouth daily as needed for allergies.      magic mouthwash w/lidocaine SOLN Take 5 mLs by mouth 3 (three) times daily as needed (gargle and spit). 180 mL 0   methylPREDNISolone (MEDROL DOSEPAK) 4 MG TBPK tablet Take as directed 21 tablet 0   ondansetron (ZOFRAN-ODT) 8 MG disintegrating tablet ondansetron 8 mg disintegrating tablet     pantoprazole (PROTONIX) 40 MG tablet Take 1 tablet (40 mg total) by mouth daily. 90 tablet 0   topiramate (TOPAMAX) 50 MG tablet Take by mouth.     ferrous sulfate 325 (65 FE) MG tablet Take 1 tablet (325 mg total) by mouth every other day. 45 tablet 1   No current facility-administered medications on file prior to visit.    LMP 06/20/2022 (Exact Date)       Objective:   Physical Exam Vitals and nursing note reviewed.  Constitutional:      Appearance: Normal appearance.  HENT:     Mouth/Throat:     Mouth: Mucous membranes are moist.     Pharynx: Oropharynx is clear. No oropharyngeal exudate or posterior oropharyngeal erythema.  Eyes:     Pupils: Pupils are equal, round, and reactive to light.  Cardiovascular:     Rate and Rhythm: Normal rate and regular rhythm.     Pulses: Normal pulses.     Heart sounds: Normal heart sounds.  Pulmonary:     Effort: Pulmonary effort is normal.  Breath sounds: Normal breath sounds.  Musculoskeletal:        General: Normal range of motion.  Skin:    General: Skin is warm and dry.     Capillary Refill: Capillary refill takes less than 2 seconds.  Neurological:     General: No focal deficit present.     Mental Status: She is alert and oriented to person, place, and time.  Psychiatric:        Mood  and Affect: Mood normal.        Behavior: Behavior normal.        Thought Content: Thought content normal.        Judgment: Judgment normal.       Assessment & Plan:  1. Sore throat - Unsure of cause. Will try magic mouth wash  - Follow up with her in three days - POC Rapid Strep A- negative  - Culture, Group A Strep - magic mouthwash w/lidocaine SOLN; Take 5 mLs by mouth 3 (three) times daily as needed (gargle and spit).  Dispense: 180 mL; Refill: 0  2. Vaginal irritation - Will treat for BV  - Cervicovaginal ancillary only - metroNIDAZOLE (FLAGYL) 500 MG tablet; Take 1 tablet (500 mg total) by mouth 3 (three) times daily for 7 days.  Dispense: 21 tablet; Refill: 0  Time spent with patient today was 33 minutes which consisted of chart review, discussing diagnosis, work up, treatment answering questions and documentation.

## 2022-07-30 LAB — POCT RAPID STREP A (OFFICE): Rapid Strep A Screen: NEGATIVE

## 2022-07-31 LAB — CERVICOVAGINAL ANCILLARY ONLY
Bacterial Vaginitis (gardnerella): POSITIVE — AB
Candida Glabrata: NEGATIVE
Candida Vaginitis: NEGATIVE
Chlamydia: NEGATIVE
Comment: NEGATIVE
Comment: NEGATIVE
Comment: NEGATIVE
Comment: NEGATIVE
Comment: NEGATIVE
Comment: NORMAL
Neisseria Gonorrhea: NEGATIVE
Trichomonas: POSITIVE — AB

## 2022-07-31 LAB — CULTURE, GROUP A STREP
MICRO NUMBER:: 14031532
SPECIMEN QUALITY:: ADEQUATE

## 2022-08-04 DIAGNOSIS — M25562 Pain in left knee: Secondary | ICD-10-CM | POA: Diagnosis not present

## 2022-08-11 ENCOUNTER — Encounter: Payer: Self-pay | Admitting: *Deleted

## 2022-08-11 DIAGNOSIS — R7303 Prediabetes: Secondary | ICD-10-CM | POA: Diagnosis not present

## 2022-08-11 DIAGNOSIS — E611 Iron deficiency: Secondary | ICD-10-CM | POA: Diagnosis not present

## 2022-08-11 DIAGNOSIS — R768 Other specified abnormal immunological findings in serum: Secondary | ICD-10-CM | POA: Diagnosis not present

## 2022-08-11 DIAGNOSIS — Z6841 Body Mass Index (BMI) 40.0 and over, adult: Secondary | ICD-10-CM | POA: Diagnosis not present

## 2022-08-11 DIAGNOSIS — M545 Low back pain, unspecified: Secondary | ICD-10-CM | POA: Diagnosis not present

## 2022-08-11 DIAGNOSIS — M25571 Pain in right ankle and joints of right foot: Secondary | ICD-10-CM | POA: Diagnosis not present

## 2022-08-11 DIAGNOSIS — M419 Scoliosis, unspecified: Secondary | ICD-10-CM | POA: Diagnosis not present

## 2022-08-11 DIAGNOSIS — M546 Pain in thoracic spine: Secondary | ICD-10-CM | POA: Diagnosis not present

## 2022-08-11 DIAGNOSIS — N915 Oligomenorrhea, unspecified: Secondary | ICD-10-CM | POA: Diagnosis not present

## 2022-08-11 DIAGNOSIS — Z79899 Other long term (current) drug therapy: Secondary | ICD-10-CM | POA: Diagnosis not present

## 2022-08-11 DIAGNOSIS — E559 Vitamin D deficiency, unspecified: Secondary | ICD-10-CM | POA: Diagnosis not present

## 2022-08-13 ENCOUNTER — Encounter: Payer: Self-pay | Admitting: Adult Health

## 2022-08-13 DIAGNOSIS — Z79899 Other long term (current) drug therapy: Secondary | ICD-10-CM | POA: Diagnosis not present

## 2022-08-14 DIAGNOSIS — F439 Reaction to severe stress, unspecified: Secondary | ICD-10-CM | POA: Diagnosis not present

## 2022-08-19 ENCOUNTER — Encounter: Payer: Self-pay | Admitting: Family Medicine

## 2022-08-19 ENCOUNTER — Telehealth (INDEPENDENT_AMBULATORY_CARE_PROVIDER_SITE_OTHER): Payer: Medicaid Other | Admitting: Family Medicine

## 2022-08-19 VITALS — Ht 72.6 in | Wt >= 6400 oz

## 2022-08-19 DIAGNOSIS — J069 Acute upper respiratory infection, unspecified: Secondary | ICD-10-CM

## 2022-08-19 DIAGNOSIS — J45901 Unspecified asthma with (acute) exacerbation: Secondary | ICD-10-CM | POA: Diagnosis not present

## 2022-08-19 MED ORDER — BENZONATATE 100 MG PO CAPS: 100.0000 mg | ORAL_CAPSULE | Freq: Two times a day (BID) | ORAL | 0 refills | Status: AC | PRN

## 2022-08-19 MED ORDER — PREDNISONE 20 MG PO TABS: 40.0000 mg | ORAL_TABLET | Freq: Every day | ORAL | 0 refills | Status: AC

## 2022-08-19 MED ORDER — ALBUTEROL SULFATE HFA 108 (90 BASE) MCG/ACT IN AERS: 2.0000 | INHALATION_SPRAY | Freq: Four times a day (QID) | RESPIRATORY_TRACT | 0 refills | Status: DC | PRN

## 2022-08-19 NOTE — Progress Notes (Signed)
Virtual Visit via Video Note I connected with Maureen Lewis on 08/19/22 by a video enabled telemedicine application and verified that I am speaking with the correct person using two identifiers.  Location patient: home Location provider:work office Persons participating in the virtual visit: patient, provider  I discussed the limitations of evaluation and management by telemedicine and the availability of in person appointments. The patient expressed understanding and agreed to proceed.  Chief Complaint  Patient presents with   Shortness of Breath   Nasal Congestion   HPI: Maureen Lewis is a 26 year old female with history of OSA, mild time, GERD, hypertension, prediabetes, depression, anxiety, and asthma complaining of respiratory symptoms, as described above. Symptoms have been ongoing for almost a week. She is experiencing chills,fatigue, sore throat, nasal congestion, post-nasal drainage, cough, dyspnea, and wheezing. The cough is sometimes productive and sometimes dry, with nighttime being more severe, interfering with his sleep. No hemoptysis. Negative for CP, palpitations, vomiting, urinary symptoms, or skin rash.  She has been having loose stools without blood, averaging four to five times per day, and mild abdominal  cramp pain.  LMP 3 days ago.  She has tried Mucinex, Tylenol, and Theraflu without relief.  She reports that her pcp usually prescribes a steroid for her symptoms. She has taken prednisone in the past, last time about 2 months ago.  She needs refill for Albuterol inh.  ROS: See pertinent positives and negatives per HPI.  Past Medical History:  Diagnosis Date   Abnormal uterine bleeding    Acid reflux    Alpha thalassemia trait    Amenorrhea    Anemia    no current med.   Asthma    Back pain    Cesarean delivery delivered 10/11/2019   10/09/2019 - primary CS for failed IOL   Chest pain    resolved   Complication of anesthesia    states had to keep  giving her anesthesia during EGD   Constipation    Depression    "I'm good"   Fibromyalgia    Finger mass, left 03/2017   middle finger   Gestational diabetes 05/23/2019   hx - with pregnancy only    Gestational hypertension 09/23/2019   Guidelines for Antenatal Testing and Sonography  (with updated ICD-10 codes)  Updated  2019-09-21 with Dr. Tama High  INDICATION U/S 2 X week NST/AFI  or full BPP wkly DELIVERY Diabetes   A1 - good control - O24.410    A2 - good control - O24.419      A2  - poor control or poor compliance - O24.419, E11.65   (Macrosomia or polyhydramnios) **E11.65 is extra code for poor control**    A2/B - O24.   Headache    Hypertension    Infection    UTI   Irritable bowel syndrome (IBS)    Joint pain    Morbid obesity with body mass index (BMI) of 45.0 to 49.9 in adult [facility])    Obesity during pregnancy, antepartum 04/06/2019   Body mass index is 53.71 kg/m.  Recommendations '[x]'$  Aspirin 81 mg daily after 12 weeks; discontinue after 36 weeks '[ ]'$  Nutrition consult '[ ]'$  Weight gain 11-20 lbs for singleton and 25-35 lbs for twin pregnancy (IOM guidelines) Higher class of obesity patients recommended to gain closer to lower limit  Weight loss is associated with adverse outcomes '[ ]'$  Baseline and surveillance labs (pulled in fr   Plantar fasciitis, bilateral    resolved   Polycystic ovary  syndrome    Pre-diabetes    no meds, diet controlled, diet not check blood sugar   Prediabetes    "prediabetes"   Pregnancy induced hypertension    Shortness of breath    albuterol inhaler   Sleep apnea    no CPAP use   Supervision of normal first pregnancy 04/06/2019   [name]: [ x]Initial [ x]12 '[ ]'$ 20 '[ ]'$ 28 '[ ]'$ 32 '[ ]'$ 36 '[ ]'$ 38 '[ ]'$ 39 '[ ]'$ 40 Nursing Staff Provider Office Location [facility]  Dating   LMP Language  English  Anatomy US  Nml Flu Vaccine  Declined 07/28/19 Genetic Screen  NIPS: low risks   AFP:   negative  TDaP vaccine   08/25/19 Hgb A1C or  GTT Early A1C 5.8 Third  trimester: GDM insulin Rhogam  NA   LAB RESULTS  Feeding Plan Breast Blood Type   Vertigo 2017    Past Surgical History:  Procedure Laterality Date   CESAREAN SECTION N/A 10/09/2019   Procedure: CESAREAN SECTION;  Surgeon: Aletha Halim, MD;  Location: [facility];  Service: Obstetrics;  Laterality: N/A;   COLONOSCOPY WITH PROPOFOL  10/07/2016   DILATION AND CURETTAGE OF UTERUS     ESOPHAGOGASTRODUODENOSCOPY  12/21/2015   EXCISION MASS UPPER EXTREMETIES Left 04/21/2017   Procedure: EXCISION MASS LEFT MIDDLE FINGER;  Surgeon: Daryll Brod, MD;  Location: [place];  Service: Orthopedics;  Laterality: Left;   gastric sleeve     july 31   IR FL GUIDED LOC OF NEEDLE/CATH TIP FOR SPINAL INJECTION RT  03/13/2020   PHOTOCOAGULATION WITH LASER Left 12/13/2020   Procedure: RETINOPLEXY LEFT EYE;  Surgeon: Sherlynn Stalls, MD;  Location: [place];  Service: Ophthalmology;  Laterality: Left;   SCLERAL BUCKLE Right 12/13/2020   Procedure: SCLERAL BUCKLE WITH CRYO;  Surgeon: Sherlynn Stalls, MD;  Location: [place];  Service: Ophthalmology;  Laterality: Right;    Family History  Problem Relation Age of Onset   Diabetes Maternal Aunt    Hypertension Maternal Uncle    Depression Maternal Grandfather    Diabetes Maternal Grandfather    Hypertension Maternal Grandfather    Arthritis Mother    High blood pressure Mother    Depression Mother    Sleep apnea Mother    Obesity Mother    Diabetes Mother    Hypertension Mother    Kidney failure Mother    Sleep apnea Father    Obesity Father    Healthy Daughter     Social History   Socioeconomic History   Marital status: Single    Spouse name: Not on file   Number of children: Not on file   Years of education: Not on file   Highest education level: Not on file  Occupational History   Occupation: Press photographer associate    Employer: [id number]  Tobacco Use   Smoking status: Never   Smokeless tobacco: Never  Vaping Use   Vaping Use: Never  used  Substance and Sexual Activity   Alcohol use: Not Currently    Comment: social    Drug use: No   Sexual activity: Yes    Birth control/protection: I.U.D.  Other Topics Concern   Not on file  Social History Narrative   Right handed    Soda sometimes   Lives with mom and cousin, a grandmother in [place] also helps care for her.       She is in nursing school.    Social Determinants of Radio broadcast assistant  Strain: Not on file  Food Insecurity: Not on file  Transportation Needs: Not on file  Physical Activity: Not on file  Stress: Not on file  Social Connections: Not on file  Intimate Partner Violence: Not on file   Current Outpatient Medications:    ALPRAZolam (XANAX) 0.5 MG tablet, daily as needed., Disp: , Rfl:    aprepitant (EMEND) 40 MG capsule, Take 40 mg by mouth as needed., Disp: , Rfl:    Blood Pressure Monitoring (BLOOD PRESSURE MON/AUTO/WRIST) DEVI, Use to monitor blood pressure, Disp: 1 each, Rfl: 0   Diethylpropion HCl CR 75 MG TB24, Take by mouth., Disp: , Rfl:    ferrous sulfate 325 (65 FE) MG tablet, Take 1 tablet (325 mg total) by mouth every other day., Disp: 45 tablet, Rfl: 1   ibuprofen (ADVIL) 800 MG tablet, Take 1 tablet by mouth every 8 (eight) hours as needed., Disp: , Rfl:    levonorgestrel (MIRENA, 52 MG,) 20 MCG/DAY IUD, Take 1 device by intrauterine route., Disp: , Rfl:    loratadine (CLARITIN) 10 MG tablet, Take 10 mg by mouth daily as needed for allergies. , Disp: , Rfl:    magic mouthwash w/lidocaine SOLN, Take 5 mLs by mouth 3 (three) times daily as needed (gargle and spit)., Disp: 180 mL, Rfl: 0   ondansetron (ZOFRAN-ODT) 8 MG disintegrating tablet, ondansetron 8 mg disintegrating tablet, Disp: , Rfl:    pantoprazole (PROTONIX) 40 MG tablet, Take 1 tablet (40 mg total) by mouth daily., Disp: 90 tablet, Rfl: 0   topiramate (TOPAMAX) 50 MG tablet, Take by mouth., Disp: , Rfl:   EXAM:  VITALS per patient if applicable:Ht 6' 0.6"  (1.844 m)   Wt (!) 426 lb (193.2 kg)   LMP 08/16/2022 (Exact Date)   BMI 56.82 kg/m   GENERAL: alert, oriented, appears well and in no acute distress  HEENT: atraumatic, conjunctiva clear, no obvious abnormalities on inspection of external nose and ears  NECK: normal movements of the head and neck  LUNGS: on inspection no signs of respiratory distress, breathing rate appears normal, no obvious gross SOB, gasping or wheezing Cough a few times during her visit.  CV: no obvious cyanosis  MS: moves all visible extremities without noticeable abnormality  PSYCH/NEURO: pleasant and cooperative, no obvious depression or anxiety, speech and thought processing grossly intact  ASSESSMENT AND PLAN:  Discussed the following assessment and plan:  Moderate asthma with exacerbation, unspecified whether persistent - Plan: predniSONE (DELTASONE) 20 MG tablet, albuterol (VENTOLIN HFA) 108 (90 Base) MCG/ACT inhaler Albuterol inh 2 puff every 6 hours for a week then as needed for wheezing or shortness of breath.  She has tolerated prednisone well in the past, recommend taking medication with food for 3 to 5 days.  We discussed some side effects. Last exacerbation about 2 months ago, consider ICS or combination LABA/ICS. Instructed about warning signs. Follow-up with PCP in 3 weeks, before if needed.  URI, acute - Plan: benzonatate (TESSALON) 100 MG capsule Symptoms suggests a viral etiology, so symptomatic treatment recommended. Instructed to monitor for signs of complications, including new onset of fever among some, clearly instructed about warning signs. I also explained that cough and nasal congestion can last a few days and sometimes weeks. F/U as needed.  We discussed possible serious and likely etiologies, options for evaluation and workup, limitations of telemedicine visit vs in person visit, treatment, treatment risks and precautions. The patient was advised to call back or seek an  in-person evaluation  if the symptoms worsen or if the condition fails to improve as anticipated. I discussed the assessment and treatment plan with the patient. The patient was provided an opportunity to ask questions and all were answered. The patient agreed with the plan and demonstrated an understanding of the instructions.  Return in about 3 weeks (around 09/09/2022).  Zakara Parkey [place], MD

## 2022-08-19 NOTE — Telephone Encounter (Signed)
Patient called and none of the appointments fit her schedule. Requesting a call

## 2022-08-19 NOTE — Telephone Encounter (Signed)
Pt has been scheduled.  °

## 2022-08-25 ENCOUNTER — Other Ambulatory Visit: Payer: Self-pay

## 2022-08-25 ENCOUNTER — Encounter (HOSPITAL_BASED_OUTPATIENT_CLINIC_OR_DEPARTMENT_OTHER): Payer: Self-pay | Admitting: Emergency Medicine

## 2022-08-25 ENCOUNTER — Emergency Department (HOSPITAL_BASED_OUTPATIENT_CLINIC_OR_DEPARTMENT_OTHER)
Admission: EM | Admit: 2022-08-25 | Discharge: 2022-08-25 | Disposition: A | Discharge status: Home / Self Care | Payer: Medicaid Other | Attending: Emergency Medicine | Admitting: Emergency Medicine

## 2022-08-25 DIAGNOSIS — J069 Acute upper respiratory infection, unspecified: Secondary | ICD-10-CM | POA: Insufficient documentation

## 2022-08-25 DIAGNOSIS — Z91040 Latex allergy status: Secondary | ICD-10-CM | POA: Insufficient documentation

## 2022-08-25 DIAGNOSIS — R059 Cough, unspecified: Secondary | ICD-10-CM | POA: Diagnosis present

## 2022-08-25 DIAGNOSIS — Z20822 Contact with and (suspected) exposure to covid-19: Secondary | ICD-10-CM | POA: Insufficient documentation

## 2022-08-25 LAB — RESP PANEL BY RT-PCR (FLU A&B, COVID) ARPGX2
Influenza A by PCR: NEGATIVE
Influenza B by PCR: NEGATIVE
SARS Coronavirus 2 by RT PCR: NEGATIVE

## 2022-08-25 NOTE — Discharge Instructions (Signed)
COVID and Flu testing was negative

## 2022-08-25 NOTE — ED Triage Notes (Signed)
Pt arrives to ED with c/o runny nose, headache, cough x3 days.

## 2022-08-25 NOTE — ED Provider Notes (Addendum)
[facility] EMERGENCY DEPT Provider Note   CSN: [id number] Arrival date & time: 08/25/22  1108     History  Chief Complaint  Patient presents with   Headache   Nasal Congestion    Maureen Lewis is a 26 y.o. female.   Headache      26 year old female presenting to the emergency department with chief complaint of URI symptoms.  Family members had a URI 10 days ago and were diagnosed with rhinovirus and symptoms subsequently improved. She also had symptoms.  In the last 2 days, the patient has had a recurrence of symptoms with nasal congestion, cough, low-grade temperature but no fevers at home.  She is overall well-appearing.  She is tolerating oral intake and acting normally.  No abdominal pain or vomiting.  There are multiple sick contacts in the house with upper respiratory symptoms.  Home Medications Prior to Admission medications   Medication Sig Start Date End Date Taking? Authorizing Provider  albuterol (VENTOLIN HFA) 108 (90 Base) MCG/ACT inhaler Inhale 2 puffs into the lungs every 6 (six) hours as needed for wheezing or shortness of breath. 08/19/22   [place], Betty G, MD  ALPRAZolam Duanne Moron) 0.5 MG tablet daily as needed.    [provider]  aprepitant (EMEND) 40 MG capsule Take 40 mg by mouth as needed. 05/09/22   [provider]  benzonatate (TESSALON) 100 MG capsule Take 1 capsule (100 mg total) by mouth 2 (two) times daily as needed for up to 10 days. 08/19/22 08/29/22  [place], Betty G, MD  Blood Pressure Monitoring (BLOOD PRESSURE MON/AUTO/WRIST) DEVI Use to monitor blood pressure 09/24/21   Nafziger, Tommi Rumps, NP  Diethylpropion HCl CR 75 MG TB24 Take by mouth. 03/23/21   [provider]  ferrous sulfate 325 (65 FE) MG tablet Take 1 tablet (325 mg total) by mouth every other day. 02/23/20 08/19/22  Nafziger, Tommi Rumps, NP  ibuprofen (ADVIL) 800 MG tablet Take 1 tablet by mouth every 8 (eight) hours as needed.    [provider]  levonorgestrel (MIRENA, 52 MG,) 20 MCG/DAY IUD Take 1 device by intrauterine route. 04/02/22   [provider]  loratadine (CLARITIN) 10 MG tablet Take 10 mg by mouth daily as needed for allergies.     [provider]  magic mouthwash w/lidocaine SOLN Take 5 mLs by mouth 3 (three) times daily as needed (gargle and spit). 07/29/22   Nafziger, Tommi Rumps, NP  ondansetron (ZOFRAN-ODT) 8 MG disintegrating tablet ondansetron 8 mg disintegrating tablet    [provider]  pantoprazole (PROTONIX) 40 MG tablet Take 1 tablet (40 mg total) by mouth daily. 06/11/21   Maryjane Hurter, MD  topiramate (TOPAMAX) 50 MG tablet Take by mouth. 03/21/21   [provider]      Allergies    Bee pollen, Bee venom, Hydrocodone-acetaminophen, Peach flavor, Pollen extract, Remdesivir, Latex, Cortisone, and Cortizone-10 [hydrocortisone]    Review of Systems   Review of Systems  Neurological:  Positive for headaches.  All other systems reviewed and are negative.   Physical Exam Updated Vital Signs BP (!) 140/91   Pulse 81   Temp 97.7 F (36.5 C)   Resp 18   LMP 08/16/2022 (Exact Date)   SpO2 96%  Physical Exam Vitals and nursing note reviewed.  Constitutional:      General: She is not in acute distress.    Appearance: She is well-developed. She is obese.  HENT:     Head: Normocephalic and atraumatic.  Eyes:     Conjunctiva/sclera: Conjunctivae normal.  Cardiovascular:     Rate and Rhythm: Normal rate and regular rhythm.     Heart sounds: No murmur heard. Pulmonary:     Effort: Pulmonary effort is normal. No respiratory distress.     Breath sounds: Normal breath sounds.  Abdominal:     Palpations: Abdomen is soft.     Tenderness: There is no abdominal tenderness.  Musculoskeletal:        General: No swelling.     Cervical back: Neck supple.  Skin:    General: Skin is warm and dry.     Capillary Refill: Capillary refill takes less than 2 seconds.  Neurological:      Mental Status: She is alert.  Psychiatric:        Mood and Affect: Mood normal.     ED Results / Procedures / Treatments   Labs (all labs ordered are listed, but only abnormal results are displayed) Labs Reviewed  RESP PANEL BY RT-PCR (FLU A&B, COVID) [id number]    EKG None  Radiology No results found.  Procedures Procedures    Medications Ordered in ED Medications - No data to display  ED Course/ Medical Decision Making/ A&P                           Medical Decision Making   26 year old female presenting to the emergency department with chief complaint of URI symptoms.  Family members had a URI 10 days ago and were diagnosed with rhinovirus and symptoms subsequently improved. She also had symptoms.  In the last 2 days, the patient has had a recurrence of symptoms with nasal congestion, cough, low-grade temperature but no fevers at home.  She is overall well-appearing.  She is tolerating oral intake and acting normally.  No abdominal pain or vomiting.  There are multiple sick contacts in the house with upper respiratory symptoms.  Maureen Lewis is a 26 y.o. female who presents to the ED with a 2 day history of fever, rhinorrhea, and nasal congestion.  On my exam, the patient is well-appearing and well-hydrated.  The patient's lungs are clear to auscultation bilaterally. Additionally, the patient has a soft/non-tender abdomen and no oropharyngeal exudates.  There are no signs of meningismus.  I see no signs of an acute bacterial infection.  The patient's presentation is most consistent with a viral upper respiratory infection.  I have a low suspicion for pneumonia as the patient's cough has been non-productive and the patient is neither tachypneic nor hypoxic on room air.  Additionally, the patient is CTAB.  COVID-19 and influenza PCR testing was collected and resulted negative.  The patient's lungs are clear to auscultation and she is overall well-appearing.  I do  not think chest x-ray imaging is warranted at this time.  I discussed symptomatic management, including hydration, motrin, and tylenol. The patient felt safe being discharged from the ED.  They agreed to followup with the PCP if needed.  I provided ED return precautions.    Final Clinical Impression(s) / ED Diagnoses Final diagnoses:  Upper respiratory tract infection, unspecified type    Rx / DC Orders ED Discharge Orders     None         Regan Lemming, MD 08/25/22 Randol Kern    Regan Lemming, MD 08/25/22 [phone]

## 2022-08-25 NOTE — ED Notes (Signed)
Dc instructions reviewed with patient. Patient voiced understanding. Dc with belongings.  °

## 2022-08-27 DIAGNOSIS — F439 Reaction to severe stress, unspecified: Secondary | ICD-10-CM | POA: Diagnosis not present

## 2022-08-28 ENCOUNTER — Inpatient Hospital Stay: Payer: Medicaid Other | Admitting: Adult Health

## 2022-08-29 ENCOUNTER — Encounter: Payer: Self-pay | Admitting: Adult Health

## 2022-08-29 ENCOUNTER — Ambulatory Visit (INDEPENDENT_AMBULATORY_CARE_PROVIDER_SITE_OTHER): Payer: Medicaid Other | Admitting: Adult Health

## 2022-08-29 VITALS — BP 120/80 | HR 82 | Temp 97.8°F | Ht 72.72 in | Wt >= 6400 oz

## 2022-08-29 DIAGNOSIS — J45901 Unspecified asthma with (acute) exacerbation: Secondary | ICD-10-CM

## 2022-08-29 DIAGNOSIS — J069 Acute upper respiratory infection, unspecified: Secondary | ICD-10-CM | POA: Diagnosis not present

## 2022-08-29 NOTE — Progress Notes (Signed)
Subjective:    Patient ID: Maureen Lewis, female    DOB: 1996-10-06, 26 y.o.   MRN: [id number]  HPI 26 year old female who  has a past medical history of Abnormal uterine bleeding, Acid reflux, Alpha thalassemia trait, Amenorrhea, Anemia, Asthma, Back pain, Cesarean delivery delivered (10/11/2019), Chest pain, Complication of anesthesia, Constipation, Depression, Fibromyalgia, Finger mass, left (03/2017), Gestational diabetes (05/23/2019), Gestational hypertension (09/23/2019), Headache, Hypertension, Infection, Irritable bowel syndrome (IBS), Joint pain, Morbid obesity with body mass index (BMI) of 45.0 to 49.9 in adult [facility]), Obesity during pregnancy, antepartum (04/06/2019), Plantar fasciitis, bilateral, Polycystic ovary syndrome, Pre-diabetes, Prediabetes, Pregnancy induced hypertension, Shortness of breath, Sleep apnea, Supervision of normal first pregnancy (04/06/2019), and Vertigo (2017).  She presents to the office today for follow-up after being seen in the emergency room 4 days ago with URI-like symptoms that have been going on for at least 10 days prior.  Her symptoms included nasal congestion, cough, low-grade fever, and chest congestion.  ER her work-up clued a flu and COVID panel which were negative.  She was diagnosed with a viral UTI and advised on conservative measures  7 days prior to that she had a video visit with another provider in the office dealing with the same symptoms.  She was prescribed prednisone 20 mg for 5 days.  Today she reports that she was not able to pick up the prednisone until the beginning of this week, she started it and since starting it she feels much better.  She is using her albuterol inhaler but this is very short-lived with relief.  Most of her symptoms have resolved except she is still having some congestion that feels like tightness in her upper sternum and some mild wheezing.  She denies any fevers or chills.  Does have a history of  moderate asthma   Review of Systems See HPI   Past Medical History:  Diagnosis Date   Abnormal uterine bleeding    Acid reflux    Alpha thalassemia trait    Amenorrhea    Anemia    no current med.   Asthma    Back pain    Cesarean delivery delivered 10/11/2019   10/09/2019 - primary CS for failed IOL   Chest pain    resolved   Complication of anesthesia    states had to keep giving her anesthesia during EGD   Constipation    Depression    "I'm good"   Fibromyalgia    Finger mass, left 03/2017   middle finger   Gestational diabetes 05/23/2019   hx - with pregnancy only    Gestational hypertension 09/23/2019   Guidelines for Antenatal Testing and Sonography  (with updated ICD-10 codes)  Updated  2019-09-29 with Dr. Tama High  INDICATION U/S 2 X week NST/AFI  or full BPP wkly DELIVERY Diabetes   A1 - good control - O24.410    A2 - good control - O24.419      A2  - poor control or poor compliance - O24.419, E11.65   (Macrosomia or polyhydramnios) **E11.65 is extra code for poor control**    A2/B - O24.   Headache    Hypertension    Infection    UTI   Irritable bowel syndrome (IBS)    Joint pain    Morbid obesity with body mass index (BMI) of 45.0 to 49.9 in adult [facility])    Obesity during pregnancy, antepartum 04/06/2019   Body mass index is 53.71 kg/m.  Recommendations [  x] Aspirin 81 mg daily after 12 weeks; discontinue after 36 weeks '[ ]'$  Nutrition consult '[ ]'$  Weight gain 11-20 lbs for singleton and 25-35 lbs for twin pregnancy (IOM guidelines) Higher class of obesity patients recommended to gain closer to lower limit  Weight loss is associated with adverse outcomes '[ ]'$  Baseline and surveillance labs (pulled in fr   Plantar fasciitis, bilateral    resolved   Polycystic ovary syndrome    Pre-diabetes    no meds, diet controlled, diet not check blood sugar   Prediabetes    "prediabetes"   Pregnancy induced hypertension    Shortness of breath    albuterol inhaler    Sleep apnea    no CPAP use   Supervision of normal first pregnancy 04/06/2019   [name]: [ x]Initial [ x]12 '[ ]'$ 20 '[ ]'$ 28 '[ ]'$ 32 '[ ]'$ 36 '[ ]'$ 38 '[ ]'$ 39 '[ ]'$ 40 Nursing Staff Provider Office Location [facility]  Dating   LMP Language  English  Anatomy US  Nml Flu Vaccine  Declined 07/28/19 Genetic Screen  NIPS: low risks   AFP:   negative  TDaP vaccine   08/25/19 Hgb A1C or  GTT Early A1C 5.8 Third trimester: GDM insulin Rhogam  NA   LAB RESULTS  Feeding Plan Breast Blood Type   Vertigo 2017    Social History   Socioeconomic History   Marital status: Single    Spouse name: Not on file   Number of children: Not on file   Years of education: Not on file   Highest education level: Not on file  Occupational History   Occupation: Chemical engineer    Employer: [employer]  Tobacco Use   Smoking status: Never   Smokeless tobacco: Never  Vaping Use   Vaping Use: Never used  Substance and Sexual Activity   Alcohol use: Not Currently    Comment: social    Drug use: No   Sexual activity: Yes    Birth control/protection: I.U.D.  Other Topics Concern   Not on file  Social History Narrative   Right handed    Soda sometimes   Lives with mom and cousin, a grandmother in [place] also helps care for her.       She is in nursing school.    Social Determinants of Health   Financial Resource Strain: Not on file  Food Insecurity: Not on file  Transportation Needs: Not on file  Physical Activity: Not on file  Stress: Not on file  Social Connections: Not on file  Intimate Partner Violence: Not on file    Past Surgical History:  Procedure Laterality Date   CESAREAN SECTION N/A 10/09/2019   Procedure: CESAREAN SECTION;  Surgeon: Aletha Halim, MD;  [facility]: [facility];  Service: Obstetrics;  Laterality: N/A;   COLONOSCOPY WITH PROPOFOL  10/07/2016   DILATION AND CURETTAGE OF UTERUS     ESOPHAGOGASTRODUODENOSCOPY  12/21/2015   EXCISION MASS UPPER EXTREMETIES Left 04/21/2017   Procedure:  EXCISION MASS LEFT MIDDLE FINGER;  Surgeon: Daryll Brod, MD;  Location: [place];  Service: Orthopedics;  Laterality: Left;   gastric sleeve     july 31   IR FL GUIDED LOC OF NEEDLE/CATH TIP FOR SPINAL INJECTION RT  03/13/2020   PHOTOCOAGULATION WITH LASER Left 12/13/2020   Procedure: RETINOPLEXY LEFT EYE;  Surgeon: Sherlynn Stalls, MD;  Location: [place];  Service: Ophthalmology;  Laterality: Left;   SCLERAL BUCKLE Right 12/13/2020   Procedure: SCLERAL BUCKLE WITH CRYO;  Surgeon:  Sherlynn Stalls, MD;  Location: [place];  Service: Ophthalmology;  Laterality: Right;    Family History  Problem Relation Age of Onset   Diabetes Maternal Aunt    Hypertension Maternal Uncle    Depression Maternal Grandfather    Diabetes Maternal Grandfather    Hypertension Maternal Grandfather    Arthritis Mother    High blood pressure Mother    Depression Mother    Sleep apnea Mother    Obesity Mother    Diabetes Mother    Hypertension Mother    Kidney failure Mother    Sleep apnea Father    Obesity Father    Healthy Daughter     Allergies  Allergen Reactions   Bee Pollen Hives, Shortness Of Breath and Swelling   Bee Venom Hives, Shortness Of Breath and Swelling   Hydrocodone-Acetaminophen Anaphylaxis    No problem when she takes Tylenol   Peach Flavor Hives, Shortness Of Breath and Other (See Comments)    SWELLING OF MOUTH   Pollen Extract Hives, Shortness Of Breath and Swelling   Remdesivir Swelling    Angioedema 8/25   Latex Rash   Cortisone Itching and Swelling   Cortizone-10 [Hydrocortisone]     Swelling and itching    Current Outpatient Medications on File Prior to Visit  Medication Sig Dispense Refill   albuterol (VENTOLIN HFA) 108 (90 Base) MCG/ACT inhaler Inhale 2 puffs into the lungs every 6 (six) hours as needed for wheezing or shortness of breath. 8 g 0   ALPRAZolam (XANAX) 0.5 MG tablet daily as needed.     aprepitant (EMEND) 40 MG capsule Take 40 mg by mouth as  needed.     benzonatate (TESSALON) 100 MG capsule Take 1 capsule (100 mg total) by mouth 2 (two) times daily as needed for up to 10 days. 20 capsule 0   Blood Pressure Monitoring (BLOOD PRESSURE MON/AUTO/WRIST) DEVI Use to monitor blood pressure 1 each 0   Diethylpropion HCl CR 75 MG TB24 Take by mouth.     ferrous sulfate 325 (65 FE) MG tablet Take 1 tablet (325 mg total) by mouth every other day. 45 tablet 1   ibuprofen (ADVIL) 800 MG tablet Take 1 tablet by mouth every 8 (eight) hours as needed.     levonorgestrel (MIRENA, 52 MG,) 20 MCG/DAY IUD Take 1 device by intrauterine route.     loratadine (CLARITIN) 10 MG tablet Take 10 mg by mouth daily as needed for allergies.      magic mouthwash w/lidocaine SOLN Take 5 mLs by mouth 3 (three) times daily as needed (gargle and spit). 180 mL 0   ondansetron (ZOFRAN-ODT) 8 MG disintegrating tablet ondansetron 8 mg disintegrating tablet     pantoprazole (PROTONIX) 40 MG tablet Take 1 tablet (40 mg total) by mouth daily. 90 tablet 0   topiramate (TOPAMAX) 50 MG tablet Take by mouth.     No current facility-administered medications on file prior to visit.    BP 120/80   Pulse 82   Temp 97.8 F (36.6 C) (Oral)   Ht 6' 0.72" (1.847 m)   Wt (!) 415 lb (188.2 kg)   LMP 08/16/2022 (Exact Date)   SpO2 97%   BMI 55.18 kg/m       Objective:   Physical Exam Vitals and nursing note reviewed.  Constitutional:      Appearance: Normal appearance. She is obese.  Cardiovascular:     Rate and Rhythm: Normal rate and regular rhythm.  Pulses: Normal pulses.     Heart sounds: Normal heart sounds.  Pulmonary:     Effort: Pulmonary effort is normal.     Breath sounds: Normal breath sounds. No stridor. No wheezing, rhonchi or rales.  Skin:    General: Skin is warm and dry.  Neurological:     General: No focal deficit present.     Mental Status: She is alert and oriented to person, place, and time.  Psychiatric:        Mood and Affect: Mood normal.         Behavior: Behavior normal.        Thought Content: Thought content normal.        Judgment: Judgment normal.       Assessment & Plan:  1. Moderate asthma with exacerbation, unspecified whether persistent -Asthma versus URI.  Ad vise finishing her prednisone taper as this is helping.  Samples of  Breo ellipta 160mg/62.5 drug were given to the patient, quantity 2, Lot Number [id number] -Advised to use Breo Ellipta inhaler daily.  Follow-up if needed.  She may need to be on a daily maintenance inhaler for her asthma   CDorothyann Peng NP

## 2022-09-03 DIAGNOSIS — F439 Reaction to severe stress, unspecified: Secondary | ICD-10-CM | POA: Diagnosis not present

## 2022-09-05 ENCOUNTER — Encounter (HOSPITAL_BASED_OUTPATIENT_CLINIC_OR_DEPARTMENT_OTHER): Payer: Self-pay

## 2022-09-05 ENCOUNTER — Other Ambulatory Visit: Payer: Self-pay

## 2022-09-05 DIAGNOSIS — N390 Urinary tract infection, site not specified: Secondary | ICD-10-CM | POA: Insufficient documentation

## 2022-09-05 DIAGNOSIS — R1031 Right lower quadrant pain: Secondary | ICD-10-CM | POA: Diagnosis present

## 2022-09-05 DIAGNOSIS — Z91040 Latex allergy status: Secondary | ICD-10-CM | POA: Diagnosis not present

## 2022-09-05 DIAGNOSIS — I10 Essential (primary) hypertension: Secondary | ICD-10-CM | POA: Diagnosis not present

## 2022-09-05 DIAGNOSIS — J45909 Unspecified asthma, uncomplicated: Secondary | ICD-10-CM | POA: Insufficient documentation

## 2022-09-05 DIAGNOSIS — Z7951 Long term (current) use of inhaled steroids: Secondary | ICD-10-CM | POA: Insufficient documentation

## 2022-09-05 DIAGNOSIS — Z79899 Other long term (current) drug therapy: Secondary | ICD-10-CM | POA: Diagnosis not present

## 2022-09-05 LAB — PREGNANCY, URINE: Preg Test, Ur: NEGATIVE

## 2022-09-05 LAB — URINALYSIS, ROUTINE W REFLEX MICROSCOPIC
Bilirubin Urine: NEGATIVE
Glucose, UA: NEGATIVE mg/dL
Ketones, ur: NEGATIVE mg/dL
Nitrite: NEGATIVE
Specific Gravity, Urine: 1.03 (ref 1.005–1.030)
pH: 5.5 (ref 5.0–8.0)

## 2022-09-05 LAB — COMPREHENSIVE METABOLIC PANEL
ALT: 18 U/L (ref 0–44)
AST: 13 U/L — ABNORMAL LOW (ref 15–41)
Albumin: 4.2 g/dL (ref 3.5–5.0)
Alkaline Phosphatase: 75 U/L (ref 38–126)
Anion gap: 10 (ref 5–15)
BUN: 10 mg/dL (ref 6–20)
CO2: 25 mmol/L (ref 22–32)
Calcium: 9.1 mg/dL (ref 8.9–10.3)
Chloride: 105 mmol/L (ref 98–111)
Creatinine, Ser: 0.55 mg/dL (ref 0.44–1.00)
GFR, Estimated: >60 mL/min (ref >60)
Glucose, Bld: 101 mg/dL — ABNORMAL HIGH (ref 70–99)
Potassium: 3.7 mmol/L (ref 3.5–5.1)
Sodium: 140 mmol/L (ref 135–145)
Total Bilirubin: 0.5 mg/dL (ref 0.3–1.2)
Total Protein: 7.5 g/dL (ref 6.5–8.1)

## 2022-09-05 LAB — CBC
HCT: 40.3 % (ref 36.0–46.0)
Hemoglobin: 12.7 g/dL (ref 12.0–15.0)
MCH: 24.8 pg — ABNORMAL LOW (ref 26.0–34.0)
MCHC: 31.5 g/dL (ref 30.0–36.0)
MCV: 78.6 fL — ABNORMAL LOW (ref 80.0–100.0)
Platelets: 267 10*3/uL (ref 150–400)
RBC: 5.13 MIL/uL — ABNORMAL HIGH (ref 3.87–5.11)
RDW: 15.5 % (ref 11.5–15.5)
WBC: 7.5 10*3/uL (ref 4.0–10.5)
nRBC: 0 % (ref 0.0–0.2)

## 2022-09-05 LAB — LIPASE, BLOOD: Lipase: 16 U/L (ref 11–51)

## 2022-09-05 NOTE — ED Triage Notes (Signed)
Patient presents from home, aaxo[facility], ambulatory, states she has been having lower abd pain for 4 days, states she has also been feeling very dizzy and lightheaded but did not pass out for 1 day.

## 2022-09-06 ENCOUNTER — Emergency Department (HOSPITAL_BASED_OUTPATIENT_CLINIC_OR_DEPARTMENT_OTHER)
Admission: EM | Admit: 2022-09-06 | Discharge: 2022-09-06 | Disposition: A | Discharge status: Home / Self Care | Payer: Medicaid Other | Attending: Emergency Medicine | Admitting: Emergency Medicine

## 2022-09-06 DIAGNOSIS — N390 Urinary tract infection, site not specified: Secondary | ICD-10-CM

## 2022-09-06 MED ORDER — FLUCONAZOLE 150 MG PO TABS: ORAL_TABLET | ORAL | 0 refills | Status: DC

## 2022-09-06 MED ORDER — CEFUROXIME AXETIL 500 MG PO TABS: 500.0000 mg | ORAL_TABLET | Freq: Two times a day (BID) | ORAL | 0 refills | Status: DC

## 2022-09-06 MED ORDER — SODIUM CHLORIDE 0.9 % IV BOLUS
1000.0000 mL | Freq: Once | INTRAVENOUS | Status: AC
  Administered 2022-09-06: 1000 mL via INTRAVENOUS

## 2022-09-06 MED ORDER — ONDANSETRON HCL 4 MG/2ML IJ SOLN
4.0000 mg | Freq: Once | INTRAMUSCULAR | Status: AC
  Administered 2022-09-06: 4 mg via INTRAVENOUS
  Filled 2022-09-06: qty 2

## 2022-09-06 MED ORDER — SODIUM CHLORIDE 0.9 % IV SOLN
1.0000 g | Freq: Once | INTRAVENOUS | Status: AC
  Administered 2022-09-06: 1 g via INTRAVENOUS
  Filled 2022-09-06: qty 10

## 2022-09-06 NOTE — ED Provider Notes (Signed)
[facility] Provider Note: Georgena Spurling, MD, FACEP  CSN: [id number] MRN: [id number] ARRIVAL: 09/05/22 at 2153 ROOM: [place]  Abdominal Pain   HISTORY OF PRESENT ILLNESS  09/26/2022 1:38 AM Maureen Lewis is a 26 y.o. female with 3 days of left lower quadrant pain.  She rates the pain as a 5 out of 10.  It is worse with movement or palpation.  It is aching in nature.  She has had associated nausea but no vomiting or diarrhea.  She denies dysuria or hematuria.  She denies vaginal bleeding or discharge.  She denies back pain or flank pain.  She has had lightheadedness for 1 day but has not passed out.  She states she has been eating and drinking.   Past Medical History:  Diagnosis Date   Abnormal uterine bleeding    Acid reflux    Alpha thalassemia trait    Amenorrhea    Anemia    no current med.   Asthma    Back pain    Cesarean delivery delivered 10/11/2019   10/09/2019 - primary CS for failed IOL   Chest pain    resolved   Complication of anesthesia    states had to keep giving her anesthesia during EGD   Constipation    Depression    "I'm good"   Fibromyalgia    Finger mass, left 03/2017   middle finger   Gestational diabetes 05/23/2019   hx - with pregnancy only    Gestational hypertension 09/23/2019   Guidelines for Antenatal Testing and Sonography  (with updated ICD-10 codes)  Updated  2019-09-27 with Dr. Tama High  INDICATION U/S 2 X week NST/AFI  or full BPP wkly DELIVERY Diabetes   A1 - good control - O24.410    A2 - good control - O24.419      A2  - poor control or poor compliance - O24.419, E11.65   (Macrosomia or polyhydramnios) **E11.65 is extra code for poor control**    A2/B - O24.   Headache    Hypertension    Infection    UTI   Irritable bowel syndrome (IBS)    Joint pain    Morbid obesity with body mass index (BMI) of 45.0 to 49.9 in adult [facility])    Obesity during pregnancy, antepartum 04/06/2019   Body  mass index is 53.71 kg/m.  Recommendations '[x]'$  Aspirin 81 mg daily after 12 weeks; discontinue after 36 weeks '[ ]'$  Nutrition consult '[ ]'$  Weight gain 11-20 lbs for singleton and 25-35 lbs for twin pregnancy (IOM guidelines) Higher class of obesity patients recommended to gain closer to lower limit  Weight loss is associated with adverse outcomes '[ ]'$  Baseline and surveillance labs (pulled in fr   Plantar fasciitis, bilateral    resolved   Polycystic ovary syndrome    Pre-diabetes    no meds, diet controlled, diet not check blood sugar   Prediabetes    "prediabetes"   Pregnancy induced hypertension    Shortness of breath    albuterol inhaler   Sleep apnea    no CPAP use   Supervision of normal first pregnancy 04/06/2019   [name]: [ x]Initial [ x]12 '[ ]'$ 20 '[ ]'$ 28 '[ ]'$ 32 '[ ]'$ 36 '[ ]'$ 38 '[ ]'$ 39 '[ ]'$ 40 Nursing Staff Provider Office Location [facility]  Dating   LMP Language  English  Anatomy US  Nml Flu Vaccine  Declined 07/28/19 Genetic Screen  NIPS: low risks   AFP:  negative  TDaP vaccine   08/25/19 Hgb A1C or  GTT Early A1C 5.8 Third trimester: GDM insulin Rhogam  NA   LAB RESULTS  Feeding Plan Breast Blood Type   Vertigo 2017    Past Surgical History:  Procedure Laterality Date   CESAREAN SECTION N/A 10/09/2019   Procedure: CESAREAN SECTION;  Surgeon: Aletha Halim, MD;  [facility];  Service: Obstetrics;  Laterality: N/A;   COLONOSCOPY WITH PROPOFOL  10/07/2016   DILATION AND CURETTAGE OF UTERUS     ESOPHAGOGASTRODUODENOSCOPY  12/21/2015   EXCISION MASS UPPER EXTREMETIES Left 04/21/2017   Procedure: EXCISION MASS LEFT MIDDLE FINGER;  Surgeon: Daryll Brod, MD;  Location: [place];  Service: Orthopedics;  Laterality: Left;   gastric sleeve     july 31   IR FL GUIDED LOC OF NEEDLE/CATH TIP FOR SPINAL INJECTION RT  03/13/2020   PHOTOCOAGULATION WITH LASER Left 12/13/2020   Procedure: RETINOPLEXY LEFT EYE;  Surgeon: Sherlynn Stalls, MD;  Location: [place];   Service: Ophthalmology;  Laterality: Left;   SCLERAL BUCKLE Right 12/13/2020   Procedure: SCLERAL BUCKLE WITH CRYO;  Surgeon: Sherlynn Stalls, MD;  Location: [place];  Service: Ophthalmology;  Laterality: Right;    Family History  Problem Relation Age of Onset   Diabetes Maternal Aunt    Hypertension Maternal Uncle    Depression Maternal Grandfather    Diabetes Maternal Grandfather    Hypertension Maternal Grandfather    Arthritis Mother    High blood pressure Mother    Depression Mother    Sleep apnea Mother    Obesity Mother    Diabetes Mother    Hypertension Mother    Kidney failure Mother    Sleep apnea Father    Obesity Father    Healthy Daughter     Social History   Tobacco Use   Smoking status: Never   Smokeless tobacco: Never  Vaping Use   Vaping Use: Never used  Substance Use Topics   Alcohol use: Not Currently    Comment: social    Drug use: No    Prior to Admission medications   Medication Sig Start Date End Date Taking? Authorizing Provider  cefUROXime (CEFTIN) 500 MG tablet Take 1 tablet (500 mg total) by mouth 2 (two) times daily with a meal. 09/06/22  Yes Lesle Faron, MD  fluconazole (DIFLUCAN) 150 MG tablet Take 1 tablet as needed for vaginal yeast infection.  May repeat in 3 days if symptoms persist. 09/06/22  Yes Chalese Peach, MD  albuterol (VENTOLIN HFA) 108 (90 Base) MCG/ACT inhaler Inhale 2 puffs into the lungs every 6 (six) hours as needed for wheezing or shortness of breath. 08/19/22   Martinique, Betty G, MD  ALPRAZolam Duanne Moron) 0.5 MG tablet daily as needed.    [provider]  aprepitant (EMEND) 40 MG capsule Take 40 mg by mouth as needed. 05/09/22   [provider]  Blood Pressure Monitoring (BLOOD PRESSURE MON/AUTO/WRIST) DEVI Use to monitor blood pressure 09/24/21   Nafziger, Tommi Rumps, NP  Diethylpropion HCl CR 75 MG TB24 Take by mouth. 03/23/21   [provider]  ferrous sulfate 325 (65 FE) MG tablet Take 1 tablet (325 mg total)  by mouth every other day. 02/23/20 08/29/22  Nafziger, Tommi Rumps, NP  ibuprofen (ADVIL) 800 MG tablet Take 1 tablet by mouth every 8 (eight) hours as needed.    [provider]  levonorgestrel (MIRENA, 52 MG,) 20 MCG/DAY IUD Take 1 device by intrauterine route.  04/02/22   [provider]  loratadine (CLARITIN) 10 MG tablet Take 10 mg by mouth daily as needed for allergies.     [provider]  magic mouthwash w/lidocaine SOLN Take 5 mLs by mouth 3 (three) times daily as needed (gargle and spit). 07/29/22   Nafziger, Tommi Rumps, NP  ondansetron (ZOFRAN-ODT) 8 MG disintegrating tablet ondansetron 8 mg disintegrating tablet    [provider]  pantoprazole (PROTONIX) 40 MG tablet Take 1 tablet (40 mg total) by mouth daily. 06/11/21   Maryjane Hurter, MD  topiramate (TOPAMAX) 50 MG tablet Take by mouth. 03/21/21   [provider]    Allergies Bee pollen, Bee venom, Hydrocodone-acetaminophen, Peach flavor, Pollen extract, Remdesivir, Latex, Cortisone, and Cortizone-10 [hydrocortisone]   REVIEW OF SYSTEMS  Negative except as noted here or in the History of Present Illness.   PHYSICAL EXAMINATION  Initial Vital Signs Blood pressure 121/73, pulse 87, temperature 98.6 F (37 C), resp. rate 18, height 6' 2.5" (1.892 m), weight (!) 186 kg, last menstrual period 08/16/2022, SpO2 100 %.  Examination General: Well-developed, high BMI female in no acute distress; appearance consistent with age of record HENT: normocephalic; atraumatic Eyes: Normal appearance Neck: supple Heart: regular rate and rhythm Lungs: clear to auscultation bilaterally Abdomen: soft; nondistended; left lower quadrant tenderness; bowel sounds present GU: No CVA tenderness Extremities: No deformity; full range of motion; pulses normal Neurologic: Awake, alert and oriented; motor function intact in all extremities and symmetric; no facial droop Skin: Warm and dry Psychiatric: Normal mood and  affect   RESULTS  Summary of this visit's results, reviewed and interpreted by myself:   EKG Interpretation  Date/Time:    Ventricular Rate:    PR Interval:    QRS Duration:   QT Interval:    QTC Calculation:   R Axis:     Text Interpretation:         Laboratory Studies: Results for orders placed or performed during the hospital encounter of 09/06/22 (from the past 24 hour(s))  Lipase, blood     Status: None   Collection Time: 09/05/22 10:24 PM  Result Value Ref Range   Lipase 16 11 - 51 U/L  Comprehensive metabolic panel     Status: Abnormal   Collection Time: 09/05/22 10:24 PM  Result Value Ref Range   Sodium 140 135 - 145 mmol/L   Potassium 3.7 3.5 - 5.1 mmol/L   Chloride 105 98 - 111 mmol/L   CO2 25 22 - 32 mmol/L   Glucose, Bld 101 (H) 70 - 99 mg/dL   BUN 10 6 - 20 mg/dL   Creatinine, Ser 0.55 0.44 - 1.00 mg/dL   Calcium 9.1 8.9 - 10.3 mg/dL   Total Protein 7.5 6.5 - 8.1 g/dL   Albumin 4.2 3.5 - 5.0 g/dL   AST 13 (L) 15 - 41 U/L   ALT 18 0 - 44 U/L   Alkaline Phosphatase 75 38 - 126 U/L   Total Bilirubin 0.5 0.3 - 1.2 mg/dL   GFR, Estimated >60 >60 mL/min   Anion gap 10 5 - 15  CBC     Status: Abnormal   Collection Time: 09/05/22 10:24 PM  Result Value Ref Range   WBC 7.5 4.0 - 10.5 K/uL   RBC 5.13 (H) 3.87 - 5.11 MIL/uL   Hemoglobin 12.7 12.0 - 15.0 g/dL   HCT 40.3 36.0 - 46.0 %   MCV 78.6 (L) 80.0 - 100.0 fL   MCH 24.8 (L)  26.0 - 34.0 pg   MCHC 31.5 30.0 - 36.0 g/dL   RDW 15.5 11.5 - 15.5 %   Platelets 267 150 - 400 K/uL   nRBC 0.0 0.0 - 0.2 %  Urinalysis, Routine w reflex microscopic Urine, Clean Catch     Status: Abnormal   Collection Time: 09/05/22 10:26 PM  Result Value Ref Range   Color, Urine YELLOW YELLOW   APPearance HAZY (A) CLEAR   Specific Gravity, Urine 1.030 1.005 - 1.030   pH 5.5 5.0 - 8.0   Glucose, UA NEGATIVE NEGATIVE mg/dL   Hgb urine dipstick TRACE (A) NEGATIVE   Bilirubin Urine NEGATIVE NEGATIVE   Ketones, ur NEGATIVE  NEGATIVE mg/dL   Protein, ur TRACE (A) NEGATIVE mg/dL   Nitrite NEGATIVE NEGATIVE   Leukocytes,Ua MODERATE (A) NEGATIVE   RBC / HPF 11-20 0 - 5 RBC/hpf   WBC, UA 21-50 0 - 5 WBC/hpf   Bacteria, UA MANY (A) NONE SEEN   Squamous Epithelial / LPF 6-10 0 - 5   Mucus PRESENT   Pregnancy, urine     Status: None   Collection Time: 09/05/22 10:26 PM  Result Value Ref Range   Preg Test, Ur NEGATIVE NEGATIVE   Imaging Studies: No results found.  ED COURSE and MDM  Nursing notes, initial and subsequent vitals signs, including pulse oximetry, reviewed and interpreted by myself.  Vitals:   09/05/22 2204 09/06/22 0120 09/06/22 0215 09/06/22 0300  BP:  121/73 126/89 120/68  Pulse:  87 (!) 58 70  Resp:  18 18   Temp:      SpO2:  100% 99% 93%  Weight: (!) 186 kg     Height: 6' 2.5" (1.892 m)      Medications  sodium chloride 0.9 % bolus 1,000 mL (1,000 mLs Intravenous New Bag/Given 09/06/22 0211)  ondansetron (ZOFRAN) injection 4 mg (4 mg Intravenous Given 09/06/22 0205)  cefTRIAXone (ROCEPHIN) 1 g in sodium chloride 0.9 % 100 mL IVPB (0 g Intravenous Stopped 09/06/22 0310)    Urinalysis shows moderate leukocytes, 11-20 red cells and 21-50 white cells along with many bacteria.  This is consistent with a urinary tract infection.  I suspect her pain is more prominent on the left because the infection is involving the left ureter.  She has no CVA tenderness or pain to suggest it has progressed to pyelonephritis at this point.  We will start with a gram of Rocephin in the ED.  Her urine also appears to be concentrated which suggest dehydration.  We will also provide IV hydration.  She was advised that if symptoms persist or worsen despite treatment she should return and we would consider a CT scan at that time.  PROCEDURES  Procedures   ED DIAGNOSES     ICD-10-CM   1. Lower urinary tract infectious disease  N39.0          Casady Voshell, MD 09/06/22 [phone]

## 2022-09-07 LAB — URINE CULTURE: Culture: NO GROWTH

## 2022-09-09 ENCOUNTER — Telehealth: Payer: Self-pay | Admitting: Adult Health

## 2022-09-09 DIAGNOSIS — M25571 Pain in right ankle and joints of right foot: Secondary | ICD-10-CM | POA: Diagnosis not present

## 2022-09-09 DIAGNOSIS — M545 Low back pain, unspecified: Secondary | ICD-10-CM | POA: Diagnosis not present

## 2022-09-09 DIAGNOSIS — S83282A Other tear of lateral meniscus, current injury, left knee, initial encounter: Secondary | ICD-10-CM | POA: Diagnosis not present

## 2022-09-09 DIAGNOSIS — Z79899 Other long term (current) drug therapy: Secondary | ICD-10-CM | POA: Diagnosis not present

## 2022-09-09 DIAGNOSIS — R635 Abnormal weight gain: Secondary | ICD-10-CM | POA: Diagnosis not present

## 2022-09-09 DIAGNOSIS — Z6841 Body Mass Index (BMI) 40.0 and over, adult: Secondary | ICD-10-CM | POA: Diagnosis not present

## 2022-09-09 DIAGNOSIS — M546 Pain in thoracic spine: Secondary | ICD-10-CM | POA: Diagnosis not present

## 2022-09-09 DIAGNOSIS — M419 Scoliosis, unspecified: Secondary | ICD-10-CM | POA: Diagnosis not present

## 2022-09-09 DIAGNOSIS — Q686 Discoid meniscus: Secondary | ICD-10-CM | POA: Diagnosis not present

## 2022-09-09 DIAGNOSIS — N915 Oligomenorrhea, unspecified: Secondary | ICD-10-CM | POA: Diagnosis not present

## 2022-09-09 DIAGNOSIS — M25562 Pain in left knee: Secondary | ICD-10-CM | POA: Diagnosis not present

## 2022-09-09 NOTE — Telephone Encounter (Signed)
Pt wanted to know if we had an earlier appt. Pt advised that we didn't. Pt will keep her appt. For tomorrow.

## 2022-09-09 NOTE — Telephone Encounter (Signed)
Pt has a HFU tomorrow, but called back to ask if CMA Tillie Rung) could give her a call?

## 2022-09-10 ENCOUNTER — Encounter: Payer: Self-pay | Admitting: Adult Health

## 2022-09-10 ENCOUNTER — Ambulatory Visit (INDEPENDENT_AMBULATORY_CARE_PROVIDER_SITE_OTHER): Payer: Medicaid Other | Admitting: Adult Health

## 2022-09-10 VITALS — BP 130/60 | HR 80 | Temp 97.9°F | Ht 74.5 in | Wt >= 6400 oz

## 2022-09-10 DIAGNOSIS — R0602 Shortness of breath: Secondary | ICD-10-CM

## 2022-09-10 DIAGNOSIS — R1030 Lower abdominal pain, unspecified: Secondary | ICD-10-CM | POA: Diagnosis not present

## 2022-09-10 LAB — URINALYSIS, ROUTINE W REFLEX MICROSCOPIC
Bilirubin Urine: NEGATIVE
Hgb urine dipstick: NEGATIVE
Ketones, ur: NEGATIVE
Leukocytes,Ua: NEGATIVE
Nitrite: NEGATIVE
Specific Gravity, Urine: 1.02 (ref 1.000–1.030)
Total Protein, Urine: NEGATIVE
Urine Glucose: NEGATIVE
Urobilinogen, UA: 1 (ref 0.0–1.0)
pH: 7 (ref 5.0–8.0)

## 2022-09-10 MED ORDER — TAMSULOSIN HCL 0.4 MG PO CAPS: 0.4000 mg | ORAL_CAPSULE | Freq: Every day | ORAL | 3 refills | Status: DC

## 2022-09-10 MED ORDER — PREDNISONE 10 MG PO TABS: 10.0000 mg | ORAL_TABLET | Freq: Every day | ORAL | 0 refills | Status: DC

## 2022-09-10 NOTE — Progress Notes (Signed)
Subjective:    Patient ID: Maureen Lewis, female    DOB: 09/06/1996, 26 y.o.   MRN: [id number]  HPI  She presents to the office today for follow-up after being seen in the emergency room multiple days over the last few weeks   Was first seen on 09/04/2022 with a complaint of URI-like symptoms.  She reported multiple family members had URI symptoms at home.  Her symptoms at this time including nasal congestion, cough, low-grade temperature but no fevers.  She was not discharged on any medication.  ER her COVID and influenza PCR testing was negative.  Today she reports that she continues to have some mild shortness of breath, chest tightness, and cough.  She does have a history of asthma, was given samples of Breo as tree on 08/29/2022 during our follow-up office visit but reports that these have not been helping.  She is interested in seeing a pulmonologist   4 days ago she presented with 3 days of left lower quadrant pain.  Pain is worse with movement or palpation.  It was described as aching in nature.  She did have associated nausea but no vomiting or diarrhea.  She denied dysuria or hematuria.  She did not have any back pain or flank pain.  Her urinalysis was consistent with a urinary tract infection.  She was started with a gram of Rocephin in the emergency room and provided IV hydration because her urine was concentrated.  Her urine culture came back negative for UTI  Today she reports that she continues to have left lower quadrant/flank pain.  She has noticed blood in her urine but has been clear over the last couple of days.  She is starting to wonder if she has a kidney stone  She denies diarrhea/constipation/fevers/chills.  Review of Systems See HPI   Past Medical History:  Diagnosis Date   Abnormal uterine bleeding    Acid reflux    Alpha thalassemia trait    Amenorrhea    Anemia    no current med.   Asthma    Back pain    Cesarean delivery delivered  10/11/2019   10/09/2019 - primary CS for failed IOL   Chest pain    resolved   Complication of anesthesia    states had to keep giving her anesthesia during EGD   Constipation    Depression    "I'm good"   Fibromyalgia    Finger mass, left 03/2017   middle finger   Gestational diabetes 05/23/2019   hx - with pregnancy only    Gestational hypertension 09/23/2019   Guidelines for Antenatal Testing and Sonography  (with updated ICD-10 codes)  Updated  September 11, 2019 with Dr. Tama High  INDICATION U/S 2 X week NST/AFI  or full BPP wkly DELIVERY Diabetes   A1 - good control - O24.410    A2 - good control - O24.419      A2  - poor control or poor compliance - O24.419, E11.65   (Macrosomia or polyhydramnios) **E11.65 is extra code for poor control**    A2/B - O24.   Headache    Hypertension    Infection    UTI   Irritable bowel syndrome (IBS)    Joint pain    Morbid obesity with body mass index (BMI) of 45.0 to 49.9 in adult [facility])    Obesity during pregnancy, antepartum 04/06/2019   Body mass index is 53.71 kg/m.  Recommendations '[x]'$  Aspirin 81 mg daily after 12  weeks; discontinue after 36 weeks '[ ]'$  Nutrition consult '[ ]'$  Weight gain 11-20 lbs for singleton and 25-35 lbs for twin pregnancy (IOM guidelines) Higher class of obesity patients recommended to gain closer to lower limit  Weight loss is associated with adverse outcomes '[ ]'$  Baseline and surveillance labs (pulled in fr   Plantar fasciitis, bilateral    resolved   Polycystic ovary syndrome    Pre-diabetes    no meds, diet controlled, diet not check blood sugar   Prediabetes    "prediabetes"   Pregnancy induced hypertension    Shortness of breath    albuterol inhaler   Sleep apnea    no CPAP use   Supervision of normal first pregnancy 04/06/2019   [name]: [ x]Initial [ x]12 '[ ]'$ 20 '[ ]'$ 28 '[ ]'$ 32 '[ ]'$ 36 '[ ]'$ 38 '[ ]'$ 39 '[ ]'$ 40 Nursing Staff Provider Office Location [facility]  Dating   LMP Language  English  Anatomy US  Nml Flu  Vaccine  Declined 07/28/19 Genetic Screen  NIPS: low risks   AFP:   negative  TDaP vaccine   08/25/19 Hgb A1C or  GTT Early A1C 5.8 Third trimester: GDM insulin Rhogam  NA   LAB RESULTS  Feeding Plan Breast Blood Type   Vertigo 2017    Social History   Socioeconomic History   Marital status: Single    Spouse name: Not on file   Number of children: Not on file   Years of education: Not on file   Highest education level: Not on file  Occupational History   Occupation: Chemical engineer    Employer: [id number]  Tobacco Use   Smoking status: Never   Smokeless tobacco: Never  Vaping Use   Vaping Use: Never used  Substance and Sexual Activity   Alcohol use: Not Currently    Comment: social    Drug use: No   Sexual activity: Yes    Birth control/protection: I.U.D.  Other Topics Concern   Not on file  Social History Narrative   Right handed    Soda sometimes   Lives with mom and cousin, a grandmother in [place] also helps care for her.       She is in nursing school.    Social Determinants of Health   Financial Resource Strain: Not on file  Food Insecurity: Not on file  Transportation Needs: Not on file  Physical Activity: Not on file  Stress: Not on file  Social Connections: Not on file  Intimate Partner Violence: Not on file    Past Surgical History:  Procedure Laterality Date   CESAREAN SECTION N/A 10/09/2019   Procedure: CESAREAN SECTION;  Surgeon: Aletha Halim, MD;  [facility]: [facility];  Service: Obstetrics;  Laterality: N/A;   COLONOSCOPY WITH PROPOFOL  10/07/2016   DILATION AND CURETTAGE OF UTERUS     ESOPHAGOGASTRODUODENOSCOPY  12/21/2015   EXCISION MASS UPPER EXTREMETIES Left 04/21/2017   Procedure: EXCISION MASS LEFT MIDDLE FINGER;  Surgeon: Daryll Brod, MD;  Location: [place];  Service: Orthopedics;  Laterality: Left;   gastric sleeve     july 31   IR FL GUIDED LOC OF NEEDLE/CATH TIP FOR SPINAL INJECTION RT  03/13/2020   PHOTOCOAGULATION  WITH LASER Left 12/13/2020   Procedure: RETINOPLEXY LEFT EYE;  Surgeon: Sherlynn Stalls, MD;  Location: [place];  Service: Ophthalmology;  Laterality: Left;   SCLERAL BUCKLE Right 12/13/2020   Procedure: SCLERAL BUCKLE WITH CRYO;  Surgeon: Sherlynn Stalls, MD;  Location: [place];  Service: Ophthalmology;  Laterality: Right;    Family History  Problem Relation Age of Onset   Diabetes Maternal Aunt    Hypertension Maternal Uncle    Depression Maternal Grandfather    Diabetes Maternal Grandfather    Hypertension Maternal Grandfather    Arthritis Mother    High blood pressure Mother    Depression Mother    Sleep apnea Mother    Obesity Mother    Diabetes Mother    Hypertension Mother    Kidney failure Mother    Sleep apnea Father    Obesity Father    Healthy Daughter     Allergies  Allergen Reactions   Bee Pollen Hives, Shortness Of Breath and Swelling   Bee Venom Hives, Shortness Of Breath and Swelling   Hydrocodone-Acetaminophen Anaphylaxis    No problem when she takes Tylenol   Peach Flavor Hives, Shortness Of Breath and Other (See Comments)    SWELLING OF MOUTH   Pollen Extract Hives, Shortness Of Breath and Swelling   Remdesivir Swelling    Angioedema 8/25   Latex Rash   Cortisone Itching and Swelling   Cortizone-10 [Hydrocortisone]     Swelling and itching    Current Outpatient Medications on File Prior to Visit  Medication Sig Dispense Refill   albuterol (VENTOLIN HFA) 108 (90 Base) MCG/ACT inhaler Inhale 2 puffs into the lungs every 6 (six) hours as needed for wheezing or shortness of breath. 8 g 0   ALPRAZolam (XANAX) 0.5 MG tablet daily as needed.     aprepitant (EMEND) 40 MG capsule Take 40 mg by mouth as needed.     Blood Pressure Monitoring (BLOOD PRESSURE MON/AUTO/WRIST) DEVI Use to monitor blood pressure 1 each 0   cefUROXime (CEFTIN) 500 MG tablet Take 1 tablet (500 mg total) by mouth 2 (two) times daily with a meal. 14 tablet 0   Diethylpropion HCl CR 75 MG  TB24 Take by mouth.     ferrous sulfate 325 (65 FE) MG tablet Take 1 tablet (325 mg total) by mouth every other day. 45 tablet 1   fluconazole (DIFLUCAN) 150 MG tablet Take 1 tablet as needed for vaginal yeast infection.  May repeat in 3 days if symptoms persist. 2 tablet 0   ibuprofen (ADVIL) 800 MG tablet Take 1 tablet by mouth every 8 (eight) hours as needed.     levonorgestrel (MIRENA, 52 MG,) 20 MCG/DAY IUD Take 1 device by intrauterine route.     loratadine (CLARITIN) 10 MG tablet Take 10 mg by mouth daily as needed for allergies.      magic mouthwash w/lidocaine SOLN Take 5 mLs by mouth 3 (three) times daily as needed (gargle and spit). 180 mL 0   ondansetron (ZOFRAN-ODT) 8 MG disintegrating tablet ondansetron 8 mg disintegrating tablet     pantoprazole (PROTONIX) 40 MG tablet Take 1 tablet (40 mg total) by mouth daily. 90 tablet 0   topiramate (TOPAMAX) 50 MG tablet Take by mouth.     No current facility-administered medications on file prior to visit.    BP 130/60   Pulse 80   Temp 97.9 F (36.6 C) (Oral)   Ht 6' 2.5" (1.892 m)   Wt (!) 416 lb (188.7 kg)   LMP 08/16/2022 (Exact Date) Comment: Irregular  SpO2 98%   BMI 52.70 kg/m       Objective:   Physical Exam Vitals and nursing note reviewed.  Constitutional:      Appearance: Normal appearance. She is obese.  Cardiovascular:     Rate and Rhythm: Normal rate and regular rhythm.     Pulses: Normal pulses.     Heart sounds: Normal heart sounds.  Pulmonary:     Effort: Pulmonary effort is normal.     Breath sounds: Normal breath sounds.  Abdominal:     General: Abdomen is flat. Bowel sounds are normal.     Palpations: Abdomen is soft. There is no mass.     Tenderness: There is abdominal tenderness (left lower quadrant and left flank).  Musculoskeletal:        General: Normal range of motion.  Skin:    General: Skin is warm and dry.     Capillary Refill: Capillary refill takes less than 2 seconds.  Neurological:      General: No focal deficit present.     Mental Status: She is alert and oriented to person, place, and time.  Psychiatric:        Mood and Affect: Mood normal.        Behavior: Behavior normal.        Thought Content: Thought content normal.        Judgment: Judgment normal.       Assessment & Plan:  1. Lower abdominal pain - Will start on Flomax and do CT scan to r/o kidney stone  - tamsulosin (FLOMAX) 0.4 MG CAPS capsule; Take 1 capsule (0.4 mg total) by mouth daily.  Dispense: 30 capsule; Refill: 3 - CT RENAL STONE STUDY; Future - Urinalysis with Reflex Microscopic  2. Shortness of breath - Ongoing issues. Likely needs PFTs done.  - predniSONE (DELTASONE) 10 MG tablet; Take 1 tablet (10 mg total) by mouth daily with breakfast.  Dispense: 5 tablet; Refill: 0 - Ambulatory referral to Pulmonology    Dorothyann Peng, NP

## 2022-09-15 DIAGNOSIS — M5386 Other specified dorsopathies, lumbar region: Secondary | ICD-10-CM | POA: Diagnosis not present

## 2022-09-15 DIAGNOSIS — M9903 Segmental and somatic dysfunction of lumbar region: Secondary | ICD-10-CM | POA: Diagnosis not present

## 2022-09-15 DIAGNOSIS — M9905 Segmental and somatic dysfunction of pelvic region: Secondary | ICD-10-CM | POA: Diagnosis not present

## 2022-09-15 DIAGNOSIS — M9904 Segmental and somatic dysfunction of sacral region: Secondary | ICD-10-CM | POA: Diagnosis not present

## 2022-09-16 ENCOUNTER — Other Ambulatory Visit: Payer: Self-pay | Admitting: Adult Health

## 2022-09-16 DIAGNOSIS — M25562 Pain in left knee: Secondary | ICD-10-CM | POA: Diagnosis not present

## 2022-09-16 DIAGNOSIS — A5901 Trichomonal vulvovaginitis: Secondary | ICD-10-CM

## 2022-09-16 MED ORDER — METRONIDAZOLE 500 MG PO TABS: 500.0000 mg | ORAL_TABLET | Freq: Three times a day (TID) | ORAL | 0 refills | Status: DC

## 2022-09-17 ENCOUNTER — Other Ambulatory Visit: Payer: Medicaid Other

## 2022-09-17 ENCOUNTER — Other Ambulatory Visit (HOSPITAL_COMMUNITY)
Admission: RE | Admit: 2022-09-17 | Discharge: 2022-09-17 | Disposition: A | Discharge status: Home / Self Care | Payer: Medicaid Other | Source: Ambulatory Visit | Attending: Adult Health | Admitting: Adult Health

## 2022-09-17 ENCOUNTER — Telehealth: Payer: Self-pay | Admitting: Adult Health

## 2022-09-17 ENCOUNTER — Other Ambulatory Visit: Payer: Self-pay | Admitting: Adult Health

## 2022-09-17 DIAGNOSIS — A5901 Trichomonal vulvovaginitis: Secondary | ICD-10-CM | POA: Insufficient documentation

## 2022-09-17 MED ORDER — TINIDAZOLE 500 MG PO TABS: 2.0000 g | ORAL_TABLET | Freq: Every day | ORAL | 0 refills | Status: AC

## 2022-09-17 NOTE — Progress Notes (Signed)
Patient notified of results and verbalized understanding. Pt has been scheduled for labs. Pt stated that the flagyl was taking and she has not had any intercourse and does not know why its still present. Advise PCP verbally and he stated she may have had some resistance. Another abx will be called into pharmacy for pt. Pt verbalized understanding.

## 2022-09-17 NOTE — Telephone Encounter (Signed)
Patient with trichomonas infection. Reports that she has not had sex since her last infection and she took the entire course of Flagyl   Will treat with   Tinidazole

## 2022-09-18 DIAGNOSIS — M9905 Segmental and somatic dysfunction of pelvic region: Secondary | ICD-10-CM | POA: Diagnosis not present

## 2022-09-18 DIAGNOSIS — M9903 Segmental and somatic dysfunction of lumbar region: Secondary | ICD-10-CM | POA: Diagnosis not present

## 2022-09-18 DIAGNOSIS — M5386 Other specified dorsopathies, lumbar region: Secondary | ICD-10-CM | POA: Diagnosis not present

## 2022-09-18 DIAGNOSIS — M9904 Segmental and somatic dysfunction of sacral region: Secondary | ICD-10-CM | POA: Diagnosis not present

## 2022-09-19 ENCOUNTER — Other Ambulatory Visit: Payer: Self-pay | Admitting: Adult Health

## 2022-09-19 LAB — URINE CYTOLOGY ANCILLARY ONLY
Bacterial Vaginitis-Urine: NEGATIVE
Candida Urine: POSITIVE — AB
Chlamydia: NEGATIVE
Comment: NEGATIVE
Comment: NEGATIVE
Comment: NORMAL
Neisseria Gonorrhea: NEGATIVE
Trichomonas: POSITIVE — AB

## 2022-09-19 MED ORDER — FLUCONAZOLE 150 MG PO TABS: ORAL_TABLET | ORAL | 0 refills | Status: DC

## 2022-09-23 DIAGNOSIS — M50321 Other cervical disc degeneration at C4-C5 level: Secondary | ICD-10-CM | POA: Diagnosis not present

## 2022-09-23 DIAGNOSIS — M9905 Segmental and somatic dysfunction of pelvic region: Secondary | ICD-10-CM | POA: Diagnosis not present

## 2022-09-23 DIAGNOSIS — M531 Cervicobrachial syndrome: Secondary | ICD-10-CM | POA: Diagnosis not present

## 2022-09-23 DIAGNOSIS — M50323 Other cervical disc degeneration at C6-C7 level: Secondary | ICD-10-CM | POA: Diagnosis not present

## 2022-09-23 DIAGNOSIS — M9901 Segmental and somatic dysfunction of cervical region: Secondary | ICD-10-CM | POA: Diagnosis not present

## 2022-09-23 DIAGNOSIS — M5417 Radiculopathy, lumbosacral region: Secondary | ICD-10-CM | POA: Diagnosis not present

## 2022-09-23 DIAGNOSIS — M5031 Other cervical disc degeneration,  high cervical region: Secondary | ICD-10-CM | POA: Diagnosis not present

## 2022-09-23 DIAGNOSIS — M50322 Other cervical disc degeneration at C5-C6 level: Secondary | ICD-10-CM | POA: Diagnosis not present

## 2022-09-23 DIAGNOSIS — M5137 Other intervertebral disc degeneration, lumbosacral region: Secondary | ICD-10-CM | POA: Diagnosis not present

## 2022-09-23 DIAGNOSIS — M9903 Segmental and somatic dysfunction of lumbar region: Secondary | ICD-10-CM | POA: Diagnosis not present

## 2022-09-23 DIAGNOSIS — M4312 Spondylolisthesis, cervical region: Secondary | ICD-10-CM | POA: Diagnosis not present

## 2022-09-23 DIAGNOSIS — F439 Reaction to severe stress, unspecified: Secondary | ICD-10-CM | POA: Diagnosis not present

## 2022-09-23 DIAGNOSIS — M9904 Segmental and somatic dysfunction of sacral region: Secondary | ICD-10-CM | POA: Diagnosis not present

## 2022-09-24 ENCOUNTER — Inpatient Hospital Stay: Admission: RE | Admit: 2022-09-24 | Payer: Medicaid Other | Source: Ambulatory Visit

## 2022-09-25 DIAGNOSIS — M50322 Other cervical disc degeneration at C5-C6 level: Secondary | ICD-10-CM | POA: Diagnosis not present

## 2022-09-25 DIAGNOSIS — M4312 Spondylolisthesis, cervical region: Secondary | ICD-10-CM | POA: Diagnosis not present

## 2022-09-25 DIAGNOSIS — M9904 Segmental and somatic dysfunction of sacral region: Secondary | ICD-10-CM | POA: Diagnosis not present

## 2022-09-25 DIAGNOSIS — M9901 Segmental and somatic dysfunction of cervical region: Secondary | ICD-10-CM | POA: Diagnosis not present

## 2022-09-25 DIAGNOSIS — M50321 Other cervical disc degeneration at C4-C5 level: Secondary | ICD-10-CM | POA: Diagnosis not present

## 2022-09-25 DIAGNOSIS — M50323 Other cervical disc degeneration at C6-C7 level: Secondary | ICD-10-CM | POA: Diagnosis not present

## 2022-09-25 DIAGNOSIS — M531 Cervicobrachial syndrome: Secondary | ICD-10-CM | POA: Diagnosis not present

## 2022-09-25 DIAGNOSIS — M5137 Other intervertebral disc degeneration, lumbosacral region: Secondary | ICD-10-CM | POA: Diagnosis not present

## 2022-09-25 DIAGNOSIS — M9905 Segmental and somatic dysfunction of pelvic region: Secondary | ICD-10-CM | POA: Diagnosis not present

## 2022-09-25 DIAGNOSIS — M9903 Segmental and somatic dysfunction of lumbar region: Secondary | ICD-10-CM | POA: Diagnosis not present

## 2022-09-25 DIAGNOSIS — M5417 Radiculopathy, lumbosacral region: Secondary | ICD-10-CM | POA: Diagnosis not present

## 2022-09-25 DIAGNOSIS — M25562 Pain in left knee: Secondary | ICD-10-CM | POA: Diagnosis not present

## 2022-09-25 DIAGNOSIS — M5031 Other cervical disc degeneration,  high cervical region: Secondary | ICD-10-CM | POA: Diagnosis not present

## 2022-09-29 DIAGNOSIS — M50322 Other cervical disc degeneration at C5-C6 level: Secondary | ICD-10-CM | POA: Diagnosis not present

## 2022-09-29 DIAGNOSIS — F439 Reaction to severe stress, unspecified: Secondary | ICD-10-CM | POA: Diagnosis not present

## 2022-09-29 DIAGNOSIS — M4312 Spondylolisthesis, cervical region: Secondary | ICD-10-CM | POA: Diagnosis not present

## 2022-09-29 DIAGNOSIS — M9905 Segmental and somatic dysfunction of pelvic region: Secondary | ICD-10-CM | POA: Diagnosis not present

## 2022-09-29 DIAGNOSIS — M9904 Segmental and somatic dysfunction of sacral region: Secondary | ICD-10-CM | POA: Diagnosis not present

## 2022-09-29 DIAGNOSIS — M50323 Other cervical disc degeneration at C6-C7 level: Secondary | ICD-10-CM | POA: Diagnosis not present

## 2022-09-29 DIAGNOSIS — M25562 Pain in left knee: Secondary | ICD-10-CM | POA: Diagnosis not present

## 2022-09-29 DIAGNOSIS — M9901 Segmental and somatic dysfunction of cervical region: Secondary | ICD-10-CM | POA: Diagnosis not present

## 2022-09-29 DIAGNOSIS — M5031 Other cervical disc degeneration,  high cervical region: Secondary | ICD-10-CM | POA: Diagnosis not present

## 2022-09-29 DIAGNOSIS — M50321 Other cervical disc degeneration at C4-C5 level: Secondary | ICD-10-CM | POA: Diagnosis not present

## 2022-09-29 DIAGNOSIS — M9903 Segmental and somatic dysfunction of lumbar region: Secondary | ICD-10-CM | POA: Diagnosis not present

## 2022-09-29 DIAGNOSIS — M5137 Other intervertebral disc degeneration, lumbosacral region: Secondary | ICD-10-CM | POA: Diagnosis not present

## 2022-09-29 DIAGNOSIS — M531 Cervicobrachial syndrome: Secondary | ICD-10-CM | POA: Diagnosis not present

## 2022-09-29 DIAGNOSIS — M5417 Radiculopathy, lumbosacral region: Secondary | ICD-10-CM | POA: Diagnosis not present

## 2022-09-30 NOTE — Progress Notes (Unsigned)
Synopsis: Referred for asthma with exacerbation by Dorothyann Peng, NP  Subjective:   PATIENT ID: Maureen Lewis GENDER: female DOB: 1996/02/28, MRN: [id number]  No chief complaint on file.   26yF with obesity, asthma (she thinks she maybe had PFTs a long time ago), Moderate OSA (HSAT 03/26/20) not on CPAP - has machine still but needs a new mask (it's a ResMed Airsense with nasal pillows) following with [facility], GERD, PCOS, alpha thal trait.  She says she isn't doing well lately. Has dyspnea, chest pain, cough over last 3-4 months. She is using advair 2 puffs once daily, rinses mouth afterward - she says she thinks she's only been on it for about 2 weeks and she notices no effect from this. Prednisone burst did decrease her dyspnea but didn't wipe it out altogether. Diagnosed with asthma some time in the last several years. She is unable to identify any reliable trigger for asthma. All of her symptoms are worse lying down. She does think she has a metallic type taste in her mouth over last 4-5 months. She doesn't think it necessarily feels like heartburn. Has DOE to a couple of steps currently. She sleeps on an incline due to DOE. Cough is productive throughout the day, yellowish-brownish. Sometimes has postnasal drainage.   CP is achy stabbing pressure central, radiates outward. Happens at rest, a bit worse with exertion. Worse also with deep breath, coughing. She hasn't identified anything to relieve it.   At night she feels like she's suffocating.   She went on leave after this weekend, worked as Scientist, water quality at [employer]. Has never lived outside of [place], she has no pets at home.   Never smoked, vaped.  No family history of lung disease  Interval HPI: PFT with mild restriction and low ERV 06/2021.   Seen in PCP clinic 11/22 and given prednisone taper.   Looks like she had lap gastric sleeve 04/[facility]  Otherwise pertinent review of systems is negative.  Past Medical  History:  Diagnosis Date   Abnormal uterine bleeding    Acid reflux    Alpha thalassemia trait    Amenorrhea    Anemia    no current med.   Asthma    Back pain    Cesarean delivery delivered 10/11/2019   10/09/2019 - primary CS for failed IOL   Chest pain    resolved   Complication of anesthesia    states had to keep giving her anesthesia during EGD   Constipation    Depression    "I'm good"   Fibromyalgia    Finger mass, left 03/2017   middle finger   Gestational diabetes 05/23/2019   hx - with pregnancy only    Gestational hypertension 09/23/2019   Guidelines for Antenatal Testing and Sonography  (with updated ICD-10 codes)  Updated  26-Sep-2019 with Dr. Tama High  INDICATION U/S 2 X week NST/AFI  or full BPP wkly DELIVERY Diabetes   A1 - good control - O24.410    A2 - good control - O24.419      A2  - poor control or poor compliance - O24.419, E11.65   (Macrosomia or polyhydramnios) **E11.65 is extra code for poor control**    A2/B - O24.   Headache    Hypertension    Infection    UTI   Irritable bowel syndrome (IBS)    Joint pain    Morbid obesity with body mass index (BMI) of 45.0 to 49.9 in adult (  [place])    Obesity during pregnancy, antepartum 04/06/2019   Body mass index is 53.71 kg/m.  Recommendations '[x]'$  Aspirin 81 mg daily after 12 weeks; discontinue after 36 weeks '[ ]'$  Nutrition consult '[ ]'$  Weight gain 11-20 lbs for singleton and 25-35 lbs for twin pregnancy (IOM guidelines) Higher class of obesity patients recommended to gain closer to lower limit  Weight loss is associated with adverse outcomes '[ ]'$  Baseline and surveillance labs (pulled in fr   Plantar fasciitis, bilateral    resolved   Polycystic ovary syndrome    Pre-diabetes    no meds, diet controlled, diet not check blood sugar   Prediabetes    "prediabetes"   Pregnancy induced hypertension    Shortness of breath    albuterol inhaler   Sleep apnea    no CPAP use   Supervision of normal first pregnancy  04/06/2019   [name]: [ x]Initial [ x]12 '[ ]'$ 20 '[ ]'$ 28 '[ ]'$ 32 '[ ]'$ 36 '[ ]'$ 38 '[ ]'$ 39 '[ ]'$ 40 Nursing Staff Provider Office Location [facility]  Dating   LMP Language  English  Anatomy US  Nml Flu Vaccine  Declined 07/28/19 Genetic Screen  NIPS: low risks   AFP:   negative  TDaP vaccine   08/25/19 Hgb A1C or  GTT Early A1C 5.8 Third trimester: GDM insulin Rhogam  NA   LAB RESULTS  Feeding Plan Breast Blood Type   Vertigo 2017     Family History  Problem Relation Age of Onset   Diabetes Maternal Aunt    Hypertension Maternal Uncle    Depression Maternal Grandfather    Diabetes Maternal Grandfather    Hypertension Maternal Grandfather    Arthritis Mother    High blood pressure Mother    Depression Mother    Sleep apnea Mother    Obesity Mother    Diabetes Mother    Hypertension Mother    Kidney failure Mother    Sleep apnea Father    Obesity Father    Healthy Daughter      Past Surgical History:  Procedure Laterality Date   CESAREAN SECTION N/A 10/09/2019   Procedure: CESAREAN SECTION;  Surgeon: Aletha Halim, MD;  [facility];  Service: Obstetrics;  Laterality: N/A;   COLONOSCOPY WITH PROPOFOL  10/07/2016   DILATION AND CURETTAGE OF UTERUS     ESOPHAGOGASTRODUODENOSCOPY  12/21/2015   EXCISION MASS UPPER EXTREMETIES Left 04/21/2017   Procedure: EXCISION MASS LEFT MIDDLE FINGER;  Surgeon: Daryll Brod, MD;  Location: [place];  Service: Orthopedics;  Laterality: Left;   gastric sleeve     july 31   IR FL GUIDED LOC OF NEEDLE/CATH TIP FOR SPINAL INJECTION RT  03/13/2020   PHOTOCOAGULATION WITH LASER Left 12/13/2020   Procedure: RETINOPLEXY LEFT EYE;  Surgeon: Sherlynn Stalls, MD;  Location: [place];  Service: Ophthalmology;  Laterality: Left;   SCLERAL BUCKLE Right 12/13/2020   Procedure: SCLERAL BUCKLE WITH CRYO;  Surgeon: Sherlynn Stalls, MD;  Location: [place];  Service: Ophthalmology;  Laterality: Right;    Social History   Socioeconomic History    Marital status: Single    Spouse name: Not on file   Number of children: Not on file   Years of education: Not on file   Highest education level: Not on file  Occupational History   Occupation: Chemical engineer    Employer: [id number]  Tobacco Use   Smoking status: Never   Smokeless tobacco: Never  Vaping Use   Vaping Use:  Never used  Substance and Sexual Activity   Alcohol use: Not Currently    Comment: social    Drug use: No   Sexual activity: Yes    Birth control/protection: I.U.D.  Other Topics Concern   Not on file  Social History Narrative   Right handed    Soda sometimes   Lives with mom and cousin, a grandmother in [place] also helps care for her.       She is in nursing school.    Social Determinants of Health   Financial Resource Strain: Not on file  Food Insecurity: Not on file  Transportation Needs: Not on file  Physical Activity: Not on file  Stress: Not on file  Social Connections: Not on file  Intimate Partner Violence: Not on file     Allergies  Allergen Reactions   Bee Pollen Hives, Shortness Of Breath and Swelling   Bee Venom Hives, Shortness Of Breath and Swelling   Hydrocodone-Acetaminophen Anaphylaxis    No problem when she takes Tylenol   Peach Flavor Hives, Shortness Of Breath and Other (See Comments)    SWELLING OF MOUTH   Pollen Extract Hives, Shortness Of Breath and Swelling   Remdesivir Swelling    Angioedema 8/25   Latex Rash   Cortisone Itching and Swelling   Cortizone-10 [Hydrocortisone]     Swelling and itching     Outpatient Medications Prior to Visit  Medication Sig Dispense Refill   albuterol (VENTOLIN HFA) 108 (90 Base) MCG/ACT inhaler Inhale 2 puffs into the lungs every 6 (six) hours as needed for wheezing or shortness of breath. 8 g 0   ALPRAZolam (XANAX) 0.5 MG tablet daily as needed.     aprepitant (EMEND) 40 MG capsule Take 40 mg by mouth as needed.     Blood Pressure Monitoring (BLOOD PRESSURE MON/AUTO/WRIST) DEVI  Use to monitor blood pressure 1 each 0   Diethylpropion HCl CR 75 MG TB24 Take by mouth.     ferrous sulfate 325 (65 FE) MG tablet Take 1 tablet (325 mg total) by mouth every other day. 45 tablet 1   fluconazole (DIFLUCAN) 150 MG tablet Take 1 tablet as needed for vaginal yeast infection.  May repeat in 3 days if symptoms persist. 2 tablet 0   ibuprofen (ADVIL) 800 MG tablet Take 1 tablet by mouth every 8 (eight) hours as needed.     levonorgestrel (MIRENA, 52 MG,) 20 MCG/DAY IUD Take 1 device by intrauterine route.     loratadine (CLARITIN) 10 MG tablet Take 10 mg by mouth daily as needed for allergies.      magic mouthwash w/lidocaine SOLN Take 5 mLs by mouth 3 (three) times daily as needed (gargle and spit). 180 mL 0   ondansetron (ZOFRAN-ODT) 8 MG disintegrating tablet ondansetron 8 mg disintegrating tablet     pantoprazole (PROTONIX) 40 MG tablet Take 1 tablet (40 mg total) by mouth daily. 90 tablet 0   predniSONE (DELTASONE) 10 MG tablet Take 1 tablet (10 mg total) by mouth daily with breakfast. 5 tablet 0   tamsulosin (FLOMAX) 0.4 MG CAPS capsule Take 1 capsule (0.4 mg total) by mouth daily. 30 capsule 3   topiramate (TOPAMAX) 50 MG tablet Take by mouth.     No facility-administered medications prior to visit.       Objective:   Physical Exam:  General appearance: 25 y.o., female, NAD, conversant  Eyes: anicteric sclerae, moist conjunctivae; no lid-lag; PERRL, tracking appropriately HENT: NCAT; oropharynx, MMM, no mucosal  ulcerations; normal hard and soft palate Neck: Trachea midline; no lymphadenopathy, no JVD Lungs: CTAB, no crackles, no wheeze, with normal respiratory effort CV: RRR, no MRGs  Abdomen: Soft, non-tender; non-distended, BS present, obese  Extremities: No peripheral edema, radial and DP pulses present bilaterally  Skin: Normal temperature, turgor and texture; no rash Psych: Appropriate affect Neuro: Alert and oriented to person and place, no focal deficit     There were no vitals filed for this visit.    on RA BMI Readings from Last 3 Encounters:  09/10/22 52.70 kg/m  09/05/22 51.94 kg/m  08/29/22 55.18 kg/m   Wt Readings from Last 3 Encounters:  09/10/22 (!) 416 lb (188.7 kg)  09/05/22 (!) 410 lb (186 kg)  08/29/22 (!) 415 lb (188.2 kg)     CBC    Component Value Date/Time   WBC 7.5 09/05/2022 2224   RBC 5.13 (H) 09/05/2022 2224   HGB 12.7 09/05/2022 2224   HGB 13.0 05/06/2021 1059   HCT 40.3 09/05/2022 2224   HCT 42.4 05/06/2021 1059   PLT 267 09/05/2022 2224   PLT 268 05/06/2021 1059   MCV 78.6 (L) 09/05/2022 2224   MCV 74 (L) 05/06/2021 1059   MCH 24.8 (L) 09/05/2022 2224   MCHC 31.5 09/05/2022 2224   RDW 15.5 09/05/2022 2224   RDW 15.4 05/06/2021 1059   LYMPHSABS 1.6 05/25/2022 1408   LYMPHSABS 1.9 05/06/2021 1059   MONOABS 0.3 05/25/2022 1408   EOSABS 0.0 05/25/2022 1408   EOSABS 0.1 05/06/2021 1059   BASOSABS 0.0 05/25/2022 1408   BASOSABS 0.0 05/06/2021 1059    No eosinophil elevation  Chest Imaging: CTA Chest 06/09/21 reviewed by me and remarkable for large heart  CTA CHest 05/25/22 reviewed by me stable  US DVT 05/25/22: 1. No evidence of deep venous thrombosis in either lower extremity. 2. Limited visualization of the calf veins bilaterally due to technical factors related to patient body habitus.  Pulmonary Functions Testing Results:    Latest Ref Rng & Units 06/25/2021   11:31 AM  PFT Results  FVC-Pre L 3.54   FVC-Predicted Pre % 81   FVC-Post L 3.62   FVC-Predicted Post % 82   Pre FEV1/FVC % % 89   Post FEV1/FCV % % 92   FEV1-Pre L 3.14   FEV1-Predicted Pre % 83   FEV1-Post L 3.34   DLCO uncorrected ml/min/mmHg 25.67   DLCO UNC% % 86   DLCO corrected ml/min/mmHg 25.91   DLCO COR %Predicted % 87   DLVA Predicted % 128   TLC L 4.53   TLC % Predicted % 70   RV % Predicted % 50    Mild restriction with low ERV  Echocardiogram:  TTE 10/03/20 indeterminate for DD, LV a little  dilated  TTE 07/2021 RV mildly enlarged    Assessment & Plan:   # Atypical chest pain: Hasn't been responsive to steroids, EKG and trops not supportive of ACS when seen in ED. Some features suggestive of gerd - worse lying down, has odd metallic taste in her mouth almost constantly.   # DOE: likely multifactorial with deconditioning, may be developing PH/diastolic dysfunction, may have asthma or obesity related air trapping.   # OSA: weight is essentially stable since last HSAT, bicarb not elevated typically.  # Asthma: unclear trigger. Hasn't had opportunity for testing to support diagnosis.  Plan: - encouraged to set up follow up with [facility] medical weight management clinic - encouraged to reach out to Neuro  to discuss finding optimal mask to support adherence to CPAP - flonase daily after shower for component of AR - start protonix 40 mg daily first thing in the morning before eating - stop advair until we get PFTs in 1-2 weeks, stop albuterol 2 days before PFTs so that we don't mask features of asthma - if confirm ventilatory defect on PFT will refer to pulmonary rehab if she is not otherwise involved in structured exercise program - if PFTs don't point to clear explanation for dyspnea, then will get TTE        Maryjane Hurter, MD [facility] 09/30/2022 6:02 PM

## 2022-10-01 ENCOUNTER — Inpatient Hospital Stay: Admission: RE | Admit: 2022-10-01 | Payer: Medicaid Other | Source: Ambulatory Visit

## 2022-10-02 ENCOUNTER — Telehealth: Payer: Self-pay | Admitting: Student

## 2022-10-02 ENCOUNTER — Encounter: Payer: Self-pay | Admitting: Student

## 2022-10-02 ENCOUNTER — Ambulatory Visit: Payer: Medicaid Other | Admitting: Student

## 2022-10-02 ENCOUNTER — Other Ambulatory Visit (HOSPITAL_COMMUNITY): Payer: Self-pay

## 2022-10-02 VITALS — BP 134/82 | HR 77 | Temp 97.6°F | Ht 74.5 in | Wt >= 6400 oz

## 2022-10-02 DIAGNOSIS — M25562 Pain in left knee: Secondary | ICD-10-CM | POA: Diagnosis not present

## 2022-10-02 DIAGNOSIS — G4733 Obstructive sleep apnea (adult) (pediatric): Secondary | ICD-10-CM

## 2022-10-02 DIAGNOSIS — R0609 Other forms of dyspnea: Secondary | ICD-10-CM | POA: Diagnosis not present

## 2022-10-02 LAB — CBC WITH DIFFERENTIAL/PLATELET
Basophils Absolute: 0 10*3/uL (ref 0.0–0.1)
Basophils Relative: 0.3 % (ref 0.0–3.0)
Eosinophils Absolute: 0.1 10*3/uL (ref 0.0–0.7)
Eosinophils Relative: 1.1 % (ref 0.0–5.0)
HCT: 40.8 % (ref 36.0–46.0)
Hemoglobin: 13.2 g/dL (ref 12.0–15.0)
Lymphocytes Relative: 34 % (ref 12.0–46.0)
Lymphs Abs: 1.8 10*3/uL (ref 0.7–4.0)
MCHC: 32.3 g/dL (ref 30.0–36.0)
MCV: 77.2 fl — ABNORMAL LOW (ref 78.0–100.0)
Monocytes Absolute: 0.4 10*3/uL (ref 0.1–1.0)
Monocytes Relative: 8.3 % (ref 3.0–12.0)
Neutro Abs: 3 10*3/uL (ref 1.4–7.7)
Neutrophils Relative %: 56.3 % (ref 43.0–77.0)
Platelets: 211 10*3/uL (ref 150.0–400.0)
RBC: 5.28 Mil/uL — ABNORMAL HIGH (ref 3.87–5.11)
RDW: 15.3 % (ref 11.5–15.5)
WBC: 5.3 10*3/uL (ref 4.0–10.5)

## 2022-10-02 LAB — BRAIN NATRIURETIC PEPTIDE: Pro B Natriuretic peptide (BNP): 15 pg/mL (ref 0.0–100.0)

## 2022-10-02 NOTE — Telephone Encounter (Signed)
The ICS/LABA MDI covered by the patients current insurance are [employer] and [employer].

## 2022-10-02 NOTE — Patient Instructions (Addendum)
-   labs today - will price out inhalers that we can try with a spacer for asthma - will try to get you cpap but we may need another sleep study - referral placed to ENT [facility] for possible upper airway obstruction since it doesn't sound like this has responded to typical asthma treatment - see you in 8 weeks or sooner if need be!

## 2022-10-02 NOTE — Telephone Encounter (Signed)
What is least expensive laba/ics compatible with spacer?   Thanks!

## 2022-10-03 LAB — IGE: IgE (Immunoglobulin E), Serum: 94 kU/L (ref <=114)

## 2022-10-03 MED ORDER — BUDESONIDE-FORMOTEROL FUMARATE 160-4.5 MCG/ACT IN AERO: 2.0000 | INHALATION_SPRAY | Freq: Two times a day (BID) | RESPIRATORY_TRACT | 6 refills | Status: DC

## 2022-10-07 DIAGNOSIS — S83282A Other tear of lateral meniscus, current injury, left knee, initial encounter: Secondary | ICD-10-CM | POA: Diagnosis not present

## 2022-10-07 DIAGNOSIS — Z6841 Body Mass Index (BMI) 40.0 and over, adult: Secondary | ICD-10-CM | POA: Diagnosis not present

## 2022-10-07 DIAGNOSIS — M545 Low back pain, unspecified: Secondary | ICD-10-CM | POA: Diagnosis not present

## 2022-10-07 DIAGNOSIS — N915 Oligomenorrhea, unspecified: Secondary | ICD-10-CM | POA: Diagnosis not present

## 2022-10-07 DIAGNOSIS — M25571 Pain in right ankle and joints of right foot: Secondary | ICD-10-CM | POA: Diagnosis not present

## 2022-10-07 DIAGNOSIS — R7303 Prediabetes: Secondary | ICD-10-CM | POA: Diagnosis not present

## 2022-10-07 DIAGNOSIS — F439 Reaction to severe stress, unspecified: Secondary | ICD-10-CM | POA: Diagnosis not present

## 2022-10-07 DIAGNOSIS — M546 Pain in thoracic spine: Secondary | ICD-10-CM | POA: Diagnosis not present

## 2022-10-07 DIAGNOSIS — E559 Vitamin D deficiency, unspecified: Secondary | ICD-10-CM | POA: Diagnosis not present

## 2022-10-07 DIAGNOSIS — M25562 Pain in left knee: Secondary | ICD-10-CM | POA: Diagnosis not present

## 2022-10-07 DIAGNOSIS — R635 Abnormal weight gain: Secondary | ICD-10-CM | POA: Diagnosis not present

## 2022-10-07 DIAGNOSIS — Z79899 Other long term (current) drug therapy: Secondary | ICD-10-CM | POA: Diagnosis not present

## 2022-10-08 DIAGNOSIS — M25562 Pain in left knee: Secondary | ICD-10-CM | POA: Diagnosis not present

## 2022-10-09 DIAGNOSIS — Z79899 Other long term (current) drug therapy: Secondary | ICD-10-CM | POA: Diagnosis not present

## 2022-10-12 ENCOUNTER — Encounter (HOSPITAL_BASED_OUTPATIENT_CLINIC_OR_DEPARTMENT_OTHER): Payer: Self-pay

## 2022-10-12 DIAGNOSIS — M545 Low back pain, unspecified: Secondary | ICD-10-CM | POA: Diagnosis present

## 2022-10-12 DIAGNOSIS — W010XXA Fall on same level from slipping, tripping and stumbling without subsequent striking against object, initial encounter: Secondary | ICD-10-CM | POA: Insufficient documentation

## 2022-10-12 DIAGNOSIS — Z91040 Latex allergy status: Secondary | ICD-10-CM | POA: Insufficient documentation

## 2022-10-12 DIAGNOSIS — Y92009 Unspecified place in unspecified non-institutional (private) residence as the place of occurrence of the external cause: Secondary | ICD-10-CM | POA: Insufficient documentation

## 2022-10-12 DIAGNOSIS — M5442 Lumbago with sciatica, left side: Secondary | ICD-10-CM | POA: Insufficient documentation

## 2022-10-12 NOTE — ED Triage Notes (Signed)
Pt c/o lumbar pain after reported mechanical fall approx 7p. Endorses intermittent radiation down L leg, denies incontinence. Pt ambulatory to triage

## 2022-10-13 ENCOUNTER — Emergency Department (HOSPITAL_BASED_OUTPATIENT_CLINIC_OR_DEPARTMENT_OTHER)
Admission: EM | Admit: 2022-10-13 | Discharge: 2022-10-13 | Disposition: A | Discharge status: Home / Self Care | Payer: Medicaid Other | Attending: Emergency Medicine | Admitting: Emergency Medicine

## 2022-10-13 ENCOUNTER — Emergency Department (HOSPITAL_BASED_OUTPATIENT_CLINIC_OR_DEPARTMENT_OTHER): Payer: Medicaid Other | Admitting: Radiology

## 2022-10-13 DIAGNOSIS — R102 Pelvic and perineal pain: Secondary | ICD-10-CM | POA: Diagnosis not present

## 2022-10-13 DIAGNOSIS — M5442 Lumbago with sciatica, left side: Secondary | ICD-10-CM

## 2022-10-13 DIAGNOSIS — M545 Low back pain, unspecified: Secondary | ICD-10-CM | POA: Diagnosis not present

## 2022-10-13 LAB — PREGNANCY, URINE: Preg Test, Ur: NEGATIVE

## 2022-10-13 MED ORDER — KETOROLAC TROMETHAMINE 30 MG/ML IJ SOLN
30.0000 mg | Freq: Once | INTRAMUSCULAR | Status: AC
  Administered 2022-10-13: 30 mg via INTRAMUSCULAR
  Filled 2022-10-13: qty 1

## 2022-10-13 MED ORDER — PREDNISONE 10 MG (21) PO TBPK: ORAL_TABLET | ORAL | 0 refills | Status: DC

## 2022-10-13 MED ORDER — ACETAMINOPHEN 500 MG PO TABS
1000.0000 mg | ORAL_TABLET | Freq: Once | ORAL | Status: DC
  Filled 2022-10-13: qty 2

## 2022-10-13 NOTE — ED Provider Notes (Signed)
[facility]ERG[facility] DEPT  Provider Note  CSN: [id number] Arrival date & time: 10/12/22 2128  History Chief Complaint  Patient presents with   Maureen Lewis is a 26 y.o. female with history of obesity, chronic leg pain on daily oxycodone, lyrica and tizanidine reports she tripped and fell on a toy at home earlier in the evening and then her child jumped on her back. She has had midline low back pain, radiating down her L leg. No prior history of back pain or sciatica. No incontinence or fever.    Home Medications Prior to Admission medications   Medication Sig Start Date End Date Taking? Authorizing Provider  predniSONE (STERAPRED UNI-PAK 21 TAB) 10 MG (21) TBPK tablet '10mg'$  Tabs, 6 day taper. Use as directed 10/13/22  Yes Truddie Hidden, MD  albuterol (VENTOLIN HFA) 108 (90 Base) MCG/ACT inhaler Inhale 2 puffs into the lungs every 6 (six) hours as needed for wheezing or shortness of breath. 08/19/22   Martinique, Betty G, MD  ALPRAZolam Duanne Moron) 0.5 MG tablet daily as needed.    [provider]  aprepitant (EMEND) 40 MG capsule Take 40 mg by mouth as needed. 05/09/22   [provider]  Blood Pressure Monitoring (BLOOD PRESSURE MON/AUTO/WRIST) DEVI Use to monitor blood pressure 09/24/21   Nafziger, Tommi Rumps, NP  budesonide-formoterol (SYMBICORT) 160-4.5 MCG/ACT inhaler Inhale 2 puffs into the lungs 2 (two) times daily. 10/03/22   Maryjane Hurter, MD  Diethylpropion HCl CR 75 MG TB24 Take by mouth. 03/23/21   [provider]  ferrous sulfate 325 (65 FE) MG tablet Take 1 tablet (325 mg total) by mouth every other day. 02/23/20 08/29/22  Nafziger, Tommi Rumps, NP  fluconazole (DIFLUCAN) 150 MG tablet Take 1 tablet as needed for vaginal yeast infection.  May repeat in 3 days if symptoms persist. Patient not taking: Reported on 10/02/2022 09/19/22   Dorothyann Peng, NP  ibuprofen (ADVIL) 800 MG tablet Take 1 tablet by mouth every 8 (eight) hours as  needed.    [provider]  levonorgestrel (MIRENA, 52 MG,) 20 MCG/DAY IUD Take 1 device by intrauterine route. 04/02/22   [provider]  loratadine (CLARITIN) 10 MG tablet Take 10 mg by mouth daily as needed for allergies.     [provider]  magic mouthwash w/lidocaine SOLN Take 5 mLs by mouth 3 (three) times daily as needed (gargle and spit). 07/29/22   Nafziger, Tommi Rumps, NP  naloxone [facility]) nasal spray 4 mg/0.1 mL SMARTSIG:Both Nares 09/09/22   [provider]  ondansetron (ZOFRAN-ODT) 8 MG disintegrating tablet ondansetron 8 mg disintegrating tablet    [provider]  oxyCODONE-acetaminophen (PERCOCET/ROXICET) 5-325 MG tablet SMARTSIG:1 Tablet(s) By Mouth 6 Times Daily PRN 09/17/22   [provider]  pantoprazole (PROTONIX) 40 MG tablet Take 1 tablet (40 mg total) by mouth daily. 06/11/21   Maryjane Hurter, MD  tamsulosin (FLOMAX) 0.4 MG CAPS capsule Take 1 capsule (0.4 mg total) by mouth daily. 09/10/22   Nafziger, Tommi Rumps, NP  tiZANidine (ZANAFLEX) 4 MG tablet Take 4 mg by mouth 3 (three) times daily as needed. 09/09/22   [provider]  topiramate (TOPAMAX) 50 MG tablet Take by mouth. 03/21/21   [provider]     Allergies    Bee pollen, Bee venom, Hydrocodone-acetaminophen, Peach flavor, Pollen extract, Remdesivir, Latex, Cortisone, and Cortizone-10 [hydrocortisone]   Review of Systems   Review of Systems Please see HPI for pertinent positives and  negatives  Physical Exam BP (!) 106/59   Pulse 61   Temp 98 F (36.7 C) (Oral)   Resp 18   SpO2 99%   Physical Exam Vitals and nursing note reviewed.  Constitutional:      Appearance: She is obese.  HENT:     Head: Normocephalic.     Nose: Nose normal.  Eyes:     Extraocular Movements: Extraocular movements intact.  Pulmonary:     Effort: Pulmonary effort is normal.  Musculoskeletal:        General: Tenderness (diffuse lumbar area, including midline  and left sciatic notch) present. Normal range of motion.     Cervical back: Neck supple.  Skin:    Findings: No rash (on exposed skin).  Neurological:     Mental Status: She is alert and oriented to person, place, and time.  Psychiatric:        Mood and Affect: Mood normal.     ED Results / Procedures / Treatments   EKG None  Procedures Procedures  Medications Ordered in the ED Medications  acetaminophen (TYLENOL) tablet 1,000 mg (has no administration in time range)  ketorolac (TORADOL) 30 MG/ML injection 30 mg (has no administration in time range)    Initial Impression and Plan  Patient here with low back pain radiating into leg after falling earlier tonight. No red flags. I personally viewed the images from radiology studies and agree with radiologist interpretation: Xrays neg for acute fracture. She is already on many pain modalities at home. Will give Toradol here. She has had prior gastric sleeve and cannot take long term NSAIDs. Will add prednisone pak for her symptoms. PCP/Pain Management follow up if not improving.   ED Course       MDM Rules/Calculators/A&P Medical Decision Making Problems Addressed: Acute midline low back pain with left-sided sciatica: acute illness or injury  Amount and/or Complexity of Data Reviewed Labs: ordered. Decision-making details documented in ED Course. Radiology: ordered and independent interpretation performed. Decision-making details documented in ED Course.  Risk OTC drugs. Prescription drug management.    Final Clinical Impression(s) / ED Diagnoses Final diagnoses:  Acute midline low back pain with left-sided sciatica    Rx / DC Orders ED Discharge Orders          Ordered    predniSONE (STERAPRED UNI-PAK 21 TAB) 10 MG (21) TBPK tablet        10/13/22 0230             Truddie Hidden, MD 10/13/22 0230

## 2022-10-15 ENCOUNTER — Inpatient Hospital Stay: Payer: Medicaid Other | Admitting: Family Medicine

## 2022-10-15 ENCOUNTER — Telehealth: Payer: Self-pay | Admitting: *Deleted

## 2022-10-15 DIAGNOSIS — M5137 Other intervertebral disc degeneration, lumbosacral region: Secondary | ICD-10-CM | POA: Diagnosis not present

## 2022-10-15 DIAGNOSIS — M9905 Segmental and somatic dysfunction of pelvic region: Secondary | ICD-10-CM | POA: Diagnosis not present

## 2022-10-15 DIAGNOSIS — M531 Cervicobrachial syndrome: Secondary | ICD-10-CM | POA: Diagnosis not present

## 2022-10-15 DIAGNOSIS — M9903 Segmental and somatic dysfunction of lumbar region: Secondary | ICD-10-CM | POA: Diagnosis not present

## 2022-10-15 DIAGNOSIS — M9904 Segmental and somatic dysfunction of sacral region: Secondary | ICD-10-CM | POA: Diagnosis not present

## 2022-10-15 DIAGNOSIS — M9901 Segmental and somatic dysfunction of cervical region: Secondary | ICD-10-CM | POA: Diagnosis not present

## 2022-10-15 DIAGNOSIS — M5417 Radiculopathy, lumbosacral region: Secondary | ICD-10-CM | POA: Diagnosis not present

## 2022-10-15 DIAGNOSIS — M50321 Other cervical disc degeneration at C4-C5 level: Secondary | ICD-10-CM | POA: Diagnosis not present

## 2022-10-15 DIAGNOSIS — M50322 Other cervical disc degeneration at C5-C6 level: Secondary | ICD-10-CM | POA: Diagnosis not present

## 2022-10-15 DIAGNOSIS — M5031 Other cervical disc degeneration,  high cervical region: Secondary | ICD-10-CM | POA: Diagnosis not present

## 2022-10-15 DIAGNOSIS — M50323 Other cervical disc degeneration at C6-C7 level: Secondary | ICD-10-CM | POA: Diagnosis not present

## 2022-10-15 DIAGNOSIS — M4312 Spondylolisthesis, cervical region: Secondary | ICD-10-CM | POA: Diagnosis not present

## 2022-10-15 NOTE — Patient Outreach (Signed)
  Care Coordination TOC Note Transition Care Management Unsuccessful Follow-up Telephone Call  Date of discharge and from where:  10/13/22 from [facility]  Attempts:  1st Attempt  Reason for unsuccessful TCM follow-up call:  Patient anavailable at this time, request to be contacted later today.

## 2022-10-15 NOTE — Patient Outreach (Signed)
  Care Coordination [facility] Note Transition Care Management Follow-up Telephone Call Date of discharge and from where: 10/13/22 from [facility] How have you been since you were released from the hospital? "I'm doing ok." Any questions or concerns? No  Items Reviewed: Did the pt receive and understand the discharge instructions provided? Yes  Medications obtained and verified? No Patient planning to pick up prednisone today Other? Yes Patient concerned that she needs PCP referral for Sports Medicine. Advised patient to pick up prescribed prednisone and take to determine effectiveness. Any new allergies since your discharge? Yes  Dietary orders reviewed? Yes Do you have support at home? Yes   Home Care and Equipment/Supplies: Were home health services ordered? no If so, what is the name of the agency? N/A  Has the agency set up a time to come to the patient's home? not applicable Were any new equipment or medical supplies ordered?  No What is the name of the medical supply agency? N/A Were you able to get the supplies/equipment? not applicable Do you have any questions related to the use of the equipment or supplies? No  Functional Questionnaire: (I = Independent and D = Dependent) ADLs: I  Bathing/Dressing- I  Meal Prep- I  Eating- I  Maintaining continence- I  Transferring/Ambulation- I  Managing Meds- I  Follow up appointments reviewed:  PCP Hospital f/u appt confirmed? Yes  Scheduled to see PCP on 10/22/22 @ 10:30am. [facility] f/u appt confirmed?  N/A ED visit   Are transportation arrangements needed? No  If their condition worsens, is the pt aware to call PCP or go to the Emergency Dept.? Yes Was the patient provided with contact information for the PCP's office or ED? No Was to pt encouraged to call back with questions or concerns? Yes   Lurena Joiner RN, BSN [place]  [employer] manager

## 2022-10-16 DIAGNOSIS — M4312 Spondylolisthesis, cervical region: Secondary | ICD-10-CM | POA: Diagnosis not present

## 2022-10-16 DIAGNOSIS — M9901 Segmental and somatic dysfunction of cervical region: Secondary | ICD-10-CM | POA: Diagnosis not present

## 2022-10-16 DIAGNOSIS — M9904 Segmental and somatic dysfunction of sacral region: Secondary | ICD-10-CM | POA: Diagnosis not present

## 2022-10-16 DIAGNOSIS — M5137 Other intervertebral disc degeneration, lumbosacral region: Secondary | ICD-10-CM | POA: Diagnosis not present

## 2022-10-16 DIAGNOSIS — M531 Cervicobrachial syndrome: Secondary | ICD-10-CM | POA: Diagnosis not present

## 2022-10-16 DIAGNOSIS — M5031 Other cervical disc degeneration,  high cervical region: Secondary | ICD-10-CM | POA: Diagnosis not present

## 2022-10-16 DIAGNOSIS — M50322 Other cervical disc degeneration at C5-C6 level: Secondary | ICD-10-CM | POA: Diagnosis not present

## 2022-10-16 DIAGNOSIS — M50321 Other cervical disc degeneration at C4-C5 level: Secondary | ICD-10-CM | POA: Diagnosis not present

## 2022-10-16 DIAGNOSIS — M9905 Segmental and somatic dysfunction of pelvic region: Secondary | ICD-10-CM | POA: Diagnosis not present

## 2022-10-16 DIAGNOSIS — M5417 Radiculopathy, lumbosacral region: Secondary | ICD-10-CM | POA: Diagnosis not present

## 2022-10-16 DIAGNOSIS — M9903 Segmental and somatic dysfunction of lumbar region: Secondary | ICD-10-CM | POA: Diagnosis not present

## 2022-10-16 DIAGNOSIS — M50323 Other cervical disc degeneration at C6-C7 level: Secondary | ICD-10-CM | POA: Diagnosis not present

## 2022-10-21 DIAGNOSIS — M9901 Segmental and somatic dysfunction of cervical region: Secondary | ICD-10-CM | POA: Diagnosis not present

## 2022-10-21 DIAGNOSIS — M5417 Radiculopathy, lumbosacral region: Secondary | ICD-10-CM | POA: Diagnosis not present

## 2022-10-21 DIAGNOSIS — M5137 Other intervertebral disc degeneration, lumbosacral region: Secondary | ICD-10-CM | POA: Diagnosis not present

## 2022-10-21 DIAGNOSIS — M50322 Other cervical disc degeneration at C5-C6 level: Secondary | ICD-10-CM | POA: Diagnosis not present

## 2022-10-21 DIAGNOSIS — M50321 Other cervical disc degeneration at C4-C5 level: Secondary | ICD-10-CM | POA: Diagnosis not present

## 2022-10-21 DIAGNOSIS — M9904 Segmental and somatic dysfunction of sacral region: Secondary | ICD-10-CM | POA: Diagnosis not present

## 2022-10-21 DIAGNOSIS — M9903 Segmental and somatic dysfunction of lumbar region: Secondary | ICD-10-CM | POA: Diagnosis not present

## 2022-10-21 DIAGNOSIS — M5031 Other cervical disc degeneration,  high cervical region: Secondary | ICD-10-CM | POA: Diagnosis not present

## 2022-10-21 DIAGNOSIS — M9905 Segmental and somatic dysfunction of pelvic region: Secondary | ICD-10-CM | POA: Diagnosis not present

## 2022-10-21 DIAGNOSIS — M50323 Other cervical disc degeneration at C6-C7 level: Secondary | ICD-10-CM | POA: Diagnosis not present

## 2022-10-21 DIAGNOSIS — M531 Cervicobrachial syndrome: Secondary | ICD-10-CM | POA: Diagnosis not present

## 2022-10-21 DIAGNOSIS — M4312 Spondylolisthesis, cervical region: Secondary | ICD-10-CM | POA: Diagnosis not present

## 2022-10-22 ENCOUNTER — Encounter: Payer: Self-pay | Admitting: Adult Health

## 2022-10-22 ENCOUNTER — Ambulatory Visit: Payer: Medicaid Other | Admitting: Adult Health

## 2022-10-22 VITALS — BP 138/82 | HR 90 | Temp 98.1°F | Ht 74.5 in | Wt >= 6400 oz

## 2022-10-22 DIAGNOSIS — M5442 Lumbago with sciatica, left side: Secondary | ICD-10-CM

## 2022-10-22 DIAGNOSIS — F439 Reaction to severe stress, unspecified: Secondary | ICD-10-CM | POA: Diagnosis not present

## 2022-10-22 NOTE — Progress Notes (Signed)
Subjective:    Patient ID: Maureen Lewis, female    DOB: 1995/12/19, 27 y.o.   MRN: [id number]  HPI 27 year old female who presents to the office today for follow-up after being seen in the emergency room on 10/13/2022.  She reported that she tripped and fell on a toy at home earlier in the evening and then her child jumped on her back.  She was complaining of midline low back pain radiating down her leg.  She did not have any incontinence or fever.  Exam in the emergency room she had tenderness diffuse lumbar area including midline and left sciatic notch.  X-rays were negative for acute fracture.  She was given Toradol emergency room and a prednisone pack for her symptoms.  Today she reports that on the way back from the hospital she had an allergic reaction to Toradol - felt as though her throat was closing and had an itchy throat.   She continues to have pain in her lower back and does not feel like the prednisone has helped much; she continues to have pain radiating down left leg. Her chronic pain meds are not helping. The best thing she found to alleviate pain was warm compresses but once she stops with heat the pain comes back   No issues with bowel or bladder.    Review of Systems See HPI   Past Medical History:  Diagnosis Date   Abnormal uterine bleeding    Acid reflux    Alpha thalassemia trait    Amenorrhea    Anemia    no current med.   Asthma    Back pain    Cesarean delivery delivered 10/11/2019   10/09/2019 - primary CS for failed IOL   Chest pain    resolved   Complication of anesthesia    states had to keep giving her anesthesia during EGD   Constipation    Depression    "I'm good"   Fibromyalgia    Finger mass, left 03/2017   middle finger   Gestational diabetes 05/23/2019   hx - with pregnancy only    Gestational hypertension 09/23/2019   Guidelines for Antenatal Testing and Sonography  (with updated ICD-10 codes)  Updated  10-06-2019 with Dr.  Tama High  INDICATION U/S 2 X week NST/AFI  or full BPP wkly DELIVERY Diabetes   A1 - good control - O24.410    A2 - good control - O24.419      A2  - poor control or poor compliance - O24.419, E11.65   (Macrosomia or polyhydramnios) **E11.65 is extra code for poor control**    A2/B - O24.   Headache    Hypertension    Infection    UTI   Irritable bowel syndrome (IBS)    Joint pain    Morbid obesity with body mass index (BMI) of 45.0 to 49.9 in adult [facility] [place] [facility])    Obesity during pregnancy, antepartum 04/06/2019   Body mass index is 53.71 kg/m.  Recommendations '[x]'$  Aspirin 81 mg daily after 12 weeks; discontinue after 36 weeks '[ ]'$  Nutrition consult '[ ]'$  Weight gain 11-20 lbs for singleton and 25-35 lbs for twin pregnancy (IOM guidelines) Higher class of obesity patients recommended to gain closer to lower limit  Weight loss is associated with adverse outcomes '[ ]'$  Baseline and surveillance labs (pulled in fr   Plantar fasciitis, bilateral    resolved   Polycystic ovary syndrome    Pre-diabetes    no  meds, diet controlled, diet not check blood sugar   Prediabetes    "prediabetes"   Pregnancy induced hypertension    Shortness of breath    albuterol inhaler   Sleep apnea    no CPAP use   Supervision of normal first pregnancy 04/06/2019   [name]: [ x]Initial [ x]12 '[ ]'$ 20 '[ ]'$ 28 '[ ]'$ 32 '[ ]'$ 36 '[ ]'$ 38 '[ ]'$ 39 '[ ]'$ 40 Nursing Staff Provider Office Location [facility]  Dating   LMP Language  English  Anatomy US  Nml Flu Vaccine  Declined 07/28/19 Genetic Screen  NIPS: low risks   AFP:   negative  TDaP vaccine   08/25/19 Hgb A1C or  GTT Early A1C 5.8 Third trimester: GDM insulin Rhogam  NA   LAB RESULTS  Feeding Plan Breast Blood Type   Vertigo 2017    Social History   Socioeconomic History   Marital status: Single    Spouse name: Not on file   Number of children: Not on file   Years of education: Not on file   Highest education level: Not on file  Occupational History   Occupation: Therapist, art    Employer: [employer]  Tobacco Use   Smoking status: Never   Smokeless tobacco: Never  Vaping Use   Vaping Use: Never used  Substance and Sexual Activity   Alcohol use: Not Currently    Comment: social    Drug use: No   Sexual activity: Yes    Birth control/protection: I.U.D.  Other Topics Concern   Not on file  Social History Narrative   Right handed    Soda sometimes   Lives with mom and cousin, a grandmother in [place] also helps care for her.       She is in nursing school.    Social Determinants of Health   Financial Resource Strain: Not on file  Food Insecurity: Not on file  Transportation Needs: Not on file  Physical Activity: Not on file  Stress: Not on file  Social Connections: Not on file  Intimate Partner Violence: Not on file    Past Surgical History:  Procedure Laterality Date   CESAREAN SECTION N/A 10/09/2019   Procedure: CESAREAN SECTION;  Surgeon: Aletha Halim, MD;  Location: [facility];  Service: Obstetrics;  Laterality: N/A;   COLONOSCOPY WITH PROPOFOL  10/07/2016   DILATION AND CURETTAGE OF UTERUS     ESOPHAGOGASTRODUODENOSCOPY  12/21/2015   EXCISION MASS UPPER EXTREMETIES Left 04/21/2017   Procedure: EXCISION MASS LEFT MIDDLE FINGER;  Surgeon: Daryll Brod, MD;  Location: [facility];  Service: Orthopedics;  Laterality: Left;   gastric sleeve     july 31   IR FL GUIDED LOC OF NEEDLE/CATH TIP FOR SPINAL INJECTION RT  03/13/2020   PHOTOCOAGULATION WITH LASER Left 12/13/2020   Procedure: RETINOPLEXY LEFT EYE;  Surgeon: Sherlynn Stalls, MD;  Location: [facility];  Service: Ophthalmology;  Laterality: Left;   SCLERAL BUCKLE Right 12/13/2020   Procedure: SCLERAL BUCKLE WITH CRYO;  Surgeon: Sherlynn Stalls, MD;  Location: [facility];  Service: Ophthalmology;  Laterality: Right;    Family History  Problem Relation Age of Onset   Diabetes Maternal Aunt    Hypertension Maternal Uncle    Depression Maternal Grandfather    Diabetes  Maternal Grandfather    Hypertension Maternal Grandfather    Arthritis Mother    High blood pressure Mother    Depression Mother    Sleep apnea Mother    Obesity Mother  Diabetes Mother    Hypertension Mother    Kidney failure Mother    Sleep apnea Father    Obesity Father    Healthy Daughter     Allergies  Allergen Reactions   Bee Pollen Hives, Shortness Of Breath and Swelling   Bee Venom Hives, Shortness Of Breath and Swelling   Hydrocodone-Acetaminophen Anaphylaxis    No problem when she takes Tylenol   Peach Flavor Hives, Shortness Of Breath and Other (See Comments)    SWELLING OF MOUTH   Pollen Extract Hives, Shortness Of Breath and Swelling   Remdesivir Swelling    Angioedema 8/25   Latex Rash   Cortisone Itching and Swelling   Cortizone-10 [Hydrocortisone]     Swelling and itching   Toradol [Ketorolac Tromethamine] Swelling    Current Outpatient Medications on File Prior to Visit  Medication Sig Dispense Refill   albuterol (VENTOLIN HFA) 108 (90 Base) MCG/ACT inhaler Inhale 2 puffs into the lungs every 6 (six) hours as needed for wheezing or shortness of breath. 8 g 0   ALPRAZolam (XANAX) 0.5 MG tablet Take 0.5 mg by mouth daily as needed for anxiety.     APPLE CIDER VINEGAR PO Take 1 tablet by mouth daily.     Blood Pressure Monitoring (BLOOD PRESSURE MON/AUTO/WRIST) DEVI Use to monitor blood pressure 1 each 0   budesonide-formoterol (SYMBICORT) 160-4.5 MCG/ACT inhaler Inhale 2 puffs into the lungs 2 (two) times daily. 1 each 6   CALCIUM PO Take 1 tablet by mouth daily.     COLLAGEN PO Take 1 tablet by mouth daily.     fluconazole (DIFLUCAN) 150 MG tablet Take 1 tablet as needed for vaginal yeast infection.  May repeat in 3 days if symptoms persist. 2 tablet 0   levonorgestrel (MIRENA, 52 MG,) 20 MCG/DAY IUD 1 each by Intrauterine route once.     loratadine (CLARITIN) 10 MG tablet Take 10 mg by mouth daily as needed for allergies.      magic mouthwash  w/lidocaine SOLN Take 5 mLs by mouth 3 (three) times daily as needed (gargle and spit). (Patient taking differently: Take 5 mLs by mouth 3 (three) times daily as needed for mouth pain.) 180 mL 0   Multiple Vitamins-Minerals (BARIATRIC MULTIVITAMINS/IRON PO) Take 1 tablet by mouth daily.     Multiple Vitamins-Minerals (HAIR SKIN NAILS PO) Take 1 tablet by mouth daily.     naloxone (NARCAN) nasal spray 4 mg/0.1 mL Place 0.4 mg into the nose once.     ondansetron (ZOFRAN-ODT) 8 MG disintegrating tablet Take 8 mg by mouth every 8 (eight) hours as needed for vomiting or nausea.     oxyCODONE-acetaminophen (PERCOCET/ROXICET) 5-325 MG tablet Take 1 tablet by mouth 6 (six) times daily.     pantoprazole (PROTONIX) 40 MG tablet Take 1 tablet (40 mg total) by mouth daily. 90 tablet 0   predniSONE (STERAPRED UNI-PAK 21 TAB) 10 MG (21) TBPK tablet '10mg'$  Tabs, 6 day taper. Use as directed 1 each 0   pregabalin (LYRICA) 75 MG capsule Take 75 mg by mouth at bedtime.     tamsulosin (FLOMAX) 0.4 MG CAPS capsule Take 1 capsule (0.4 mg total) by mouth daily. 30 capsule 3   tiZANidine (ZANAFLEX) 4 MG tablet Take 4 mg by mouth 3 (three) times daily as needed for muscle spasms.     topiramate (TOPAMAX) 50 MG tablet Take 50 mg by mouth 2 (two) times daily.     ferrous sulfate 325 (65 FE) MG  tablet Take 1 tablet (325 mg total) by mouth every other day. (Patient not taking: Reported on 10/21/2022) 45 tablet 1   No current facility-administered medications on file prior to visit.    BP 138/82   Pulse 90   Temp 98.1 F (36.7 C) (Oral)   Ht 6' 2.5" (1.892 m)   Wt (!) 408 lb (185.1 kg)   SpO2 95%   BMI 51.68 kg/m       Objective:   Physical Exam Vitals and nursing note reviewed.  Constitutional:      Appearance: Normal appearance.  Musculoskeletal:        General: Tenderness present. No swelling. Normal range of motion.  Skin:    General: Skin is warm and dry.     Capillary Refill: Capillary refill takes less  than 2 seconds.  Neurological:     General: No focal deficit present.     Mental Status: She is alert and oriented to person, place, and time.  Psychiatric:        Mood and Affect: Mood normal.        Behavior: Behavior normal.        Thought Content: Thought content normal.        Judgment: Judgment normal.       Assessment & Plan:  1. Acute left-sided low back pain with left-sided sciatica - We discussed PT - she would like to work on stretching exercises first. Stretching exercises for sciatica were given  - Continue with heat  - Follow up as needed  Dorothyann Peng, NP

## 2022-10-26 NOTE — Patient Instructions (Signed)
SURGICAL WAITING ROOM VISITATION Patients having surgery or a procedure may have no more than 2 support people in the waiting area - these visitors may rotate in the visitor waiting room.   Due to an increase in RSV and influenza rates and associated hospitalizations, children ages 10 and under may not visit patients in [facility]. If the patient needs to stay at the hospital during part of their recovery, the visitor guidelines for inpatient rooms apply.  PRE-OP VISITATION  Pre-op nurse will coordinate an appropriate time for 1 support person to accompany the patient in pre-op.  This support person may not rotate.  This visitor will be contacted when the time is appropriate for the visitor to come back in the pre-op area.  Please refer to the [facility] website for the visitor guidelines for Inpatients (after your surgery is over and you are in a regular room).  You are not required to quarantine at this time prior to your surgery. However, you must do this: Hand Hygiene often Do NOT share personal items Notify your provider if you are in close contact with someone who has COVID or you develop fever 100.4 or greater, new onset of sneezing, cough, sore throat, shortness of breath or body aches.  If you test positive for Covid or have been in contact with anyone that has tested positive in the last 10 days please notify you surgeon.    Your procedure is scheduled on:  Friday   October 31, 2022  Report to [facility] Main Entrance: [place] entrance where the [employer] is available.   Report to admitting at:09:45  AM  +++++Call this number if you have any questions or problems the morning of surgery [phone]  Do not eat food after Midnight the night prior to your surgery/procedure.  After Midnight you may have the following liquids until   09:00 AM  DAY OF SURGERY  Clear Liquid Diet Water Black Coffee (sugar ok, NO MILK/CREAM OR CREAMERS)  Tea (sugar ok, NO  MILK/CREAM OR CREAMERS) regular and decaf                             Plain Jell-O  with no fruit (NO RED)                                           Fruit ices (not with fruit pulp, NO RED)                                     Popsicles (NO RED)                                                                  Juice: apple, WHITE grape, WHITE cranberry Sports drinks like Gatorade or Powerade (NO RED)                    The day of surgery:  Drink ONE (1) Pre-Surgery G2 at   09:00 AM the morning of surgery. Drink in one sitting.  Do not sip.  This drink was given to you during your hospital pre-op appointment visit. Nothing else to drink after completing the Pre-Surgery  G2 : No candy, chewing gum or throat lozenges.    FOLLOW  ANY ADDITIONAL PRE OP INSTRUCTIONS YOU RECEIVED FROM YOUR SURGEON'S OFFICE!!!   Oral Hygiene is also important to reduce your risk of infection.        Remember - BRUSH YOUR TEETH THE MORNING OF SURGERY WITH YOUR REGULAR TOOTHPASTE  Take ONLY these medicines the morning of surgery with A SIP OF WATER: Pantoprazole (Protonix), tamsulosin (Flomax),  and Use your Symbicort inhaler. You may use Albuterol inhaler if needed.  You may take Oxycodone (Percocet) and Alprazolam (Xanax) if needed.   If You have been diagnosed with Sleep Apnea - Bring CPAP mask and tubing day of surgery. We will provide you with a CPAP machine on the day of your surgery.                   You may not have any metal on your body including hair pins, jewelry, and body piercing  Do not wear make-up, lotions, powders, perfumes  or deodorant  Do not wear nail polish including gel and S&S, artificial / acrylic nails, or any other type of covering on natural nails including finger and toenails. If you have artificial nails, gel coating, etc., that needs to be removed by a nail salon, Please have this removed prior to surgery. Not doing so may mean that your surgery could be cancelled or delayed if the  Surgeon or anesthesia staff feels like they are unable to monitor you safely.   Do not shave 48 hours prior to surgery to avoid nicks in your skin which may contribute to postoperative infections.   Contacts, Hearing Aids, dentures or bridgework may not be worn into surgery. DENTURES WILL BE REMOVED PRIOR TO SURGERY PLEASE DO NOT APPLY "Poly grip" OR ADHESIVES!!!  Patients discharged on the day of surgery will not be allowed to drive home.  Someone NEEDS to stay with you for the first 24 hours after anesthesia.  Do not bring your home medications to the hospital. The Pharmacy will dispense medications listed on your medication list to you during your admission in the Hospital.  Please read over the following fact sheets you were given: IF YOU HAVE QUESTIONS ABOUT YOUR PRE-OP INSTRUCTIONS, PLEASE CALL [phone]  ([place])   [facility] - Preparing for Surgery Before surgery, you can play an important role.  Because skin is not sterile, your skin needs to be as free of germs as possible.  You can reduce the number of germs on your skin by washing with CHG (chlorahexidine gluconate) soap before surgery.  CHG is an antiseptic cleaner which kills germs and bonds with the skin to continue killing germs even after washing. Please DO NOT use if you have an allergy to CHG or antibacterial soaps.  If your skin becomes reddened/irritated stop using the CHG and inform your nurse when you arrive at Short Stay. Do not shave (including legs and underarms) for at least 48 hours prior to the first CHG shower.  You may shave your face/neck.  Please follow these instructions carefully:  1.  Shower with CHG Soap the night before surgery and the  morning of surgery.  2.  If you choose to wash your hair, wash your hair first as usual with your normal  shampoo.  3.  After you shampoo, rinse your hair  and body thoroughly to remove the shampoo.                             4.  Use CHG as you would any other liquid soap.   You can apply chg directly to the skin and wash.  Gently with a scrungie or clean washcloth.  5.  Apply the CHG Soap to your body ONLY FROM THE NECK DOWN.   Do not use on face/ open                           Wound or open sores. Avoid contact with eyes, ears mouth and genitals (private parts).                       Wash face,  Genitals (private parts) with your normal soap.             6.  Wash thoroughly, paying special attention to the area where your  surgery  will be performed.  7.  Thoroughly rinse your body with warm water from the neck down.  8.  DO NOT shower/wash with your normal soap after using and rinsing off the CHG Soap.            9.  Pat yourself dry with a clean towel.            10.  Wear clean pajamas.            11.  Place clean sheets on your bed the night of your first shower and do not  sleep with pets.  ON THE DAY OF SURGERY : Do not apply any lotions/deodorants the morning of surgery.  Please wear clean clothes to the hospital/surgery center.    FAILURE TO FOLLOW THESE INSTRUCTIONS MAY RESULT IN THE CANCELLATION OF YOUR SURGERY  PATIENT SIGNATURE_________________________________  NURSE SIGNATURE__________________________________  ________________________________________________________________________       Adam Phenix    An incentive spirometer is a tool that can help keep your lungs clear and active. This tool measures how well you are filling your lungs with each breath. Taking long deep breaths may help reverse or decrease the chance of developing breathing (pulmonary) problems (especially infection) following: A long period of time when you are unable to move or be active. BEFORE THE PROCEDURE  If the spirometer includes an indicator to show your best effort, your nurse or respiratory therapist will set it to a desired goal. If possible, sit up straight or lean slightly forward. Try not to slouch. Hold the incentive spirometer in an upright  position. INSTRUCTIONS FOR USE  Sit on the edge of your bed if possible, or sit up as far as you can in bed or on a chair. Hold the incentive spirometer in an upright position. Breathe out normally. Place the mouthpiece in your mouth and seal your lips tightly around it. Breathe in slowly and as deeply as possible, raising the piston or the ball toward the top of the column. Hold your breath for 3-5 seconds or for as long as possible. Allow the piston or ball to fall to the bottom of the column. Remove the mouthpiece from your mouth and breathe out normally. Rest for a few seconds and repeat Steps 1 through 7 at least 10 times every 1-2 hours when you are awake. Take your time and take a few normal breaths between deep  breaths. The spirometer may include an indicator to show your best effort. Use the indicator as a goal to work toward during each repetition. After each set of 10 deep breaths, practice coughing to be sure your lungs are clear. If you have an incision (the cut made at the time of surgery), support your incision when coughing by placing a pillow or rolled up towels firmly against it. Once you are able to get out of bed, walk around indoors and cough well. You may stop using the incentive spirometer when instructed by your caregiver.  RISKS AND COMPLICATIONS Take your time so you do not get dizzy or light-headed. If you are in pain, you may need to take or ask for pain medication before doing incentive spirometry. It is harder to take a deep breath if you are having pain. AFTER USE Rest and breathe slowly and easily. It can be helpful to keep track of a log of your progress. Your caregiver can provide you with a simple table to help with this. If you are using the spirometer at home, follow these instructions: [place] IF:  You are having difficultly using the spirometer. You have trouble using the spirometer as often as instructed. Your pain medication is not giving  enough relief while using the spirometer. You develop fever of 100.5 F (38.1 C) or higher.                                                                                                    SEEK IMMEDIATE MEDICAL CARE IF:  You cough up bloody sputum that had not been present before. You develop fever of 102 F (38.9 C) or greater. You develop worsening pain at or near the incision site. MAKE SURE YOU:  Understand these instructions. Will watch your condition. Will get help right away if you are not doing well or get worse. Document Released: 02/16/2007 Document Revised: 12/29/2011 Document Reviewed: 04/19/2007 [facility] Patient Information 2014 [place], [place].

## 2022-10-26 NOTE — Progress Notes (Addendum)
COVID Vaccine received:  []  No [x]  Yes Date of any COVID positive Test in last 90 days:  None  PCP - Shirline Frees, NP Cardiologist - none Pulmonology- Felisa Bonier, MD  at [facility] Pulmonary Bariat[facility]- Voellinger, Dineen Kid, MD  [address]  [place], [place] [address]  [phone] (Work)  [phone] (Fax)   Chest x-ray - 05-25-2022  2v  Epic EKG -  09-05-2022  Epic Stress Test -  ECHO - 07-24-2021  Epic Cardiac Cath -  PFTs - 06-25-2021  Epic CTA Chest /PE - 05-25-2022  Epic  PCR screen: []  Ordered & Completed                      []   No Order but Needs PROFEND                      [x]   N/A for this surgery  Surgery Plan:  [x]  Ambulatory                            []  Outpatient in bed                            []  Admit  Anesthesia:    []  General  []  Spinal                           [x]   Choice []   MAC  Pacemaker / ICD device [x]  No []  Yes        Device order form faxed [x]  No    []   Yes      Faxed to:  Spinal Cord Stimulator:[x]  No []  Yes      (Remind patient to bring remote DOS) Other Implants:   History of Sleep Apnea? []  No [x]  Yes   CPAP used?- [x]  No []  Yes     PRE-DM   diet only   Last A1c?  08-11-22  (5.7)  Patient had gastric sleeve bariatric and has lost 150 lbs so far. CBG was done during blood draw so no finger stick for CBG. Does the patient monitor blood sugar? [x]  No []  Yes  []  N/A  ERAS Protocol Ordered: []  No  [x]  Yes PRE-SURGERY []  ENSURE  [x]  G2  Patient is to be NPO after:  09:00 am   Comments: Patient had lap. Gastric Sleeve surgery 05-19-2022 at [facility] in [place]  Patient is 6'2.5" and weighed 400 lbs today at PST appt. I have called [employer] and reserved a Baribed for her surgery day.   Activity level: Patient can climb a flight of stairs without difficulty; [x]  No CP  [x]  No SOB.  Patient can / can not perform ADLs without assistance.   Anesthesia review: PRE-DM, HTN, OSA (no CPAP), GERD,  anemia, Asthma, DOE,  "Hard to put asleep for EGD". Has MIRENA IUD.  Positive Hep B test on 07-18-22. Long term chronic pain on daily opiates.   Patient denies shortness of breath, fever, cough and chest pain at PAT appointment.  Patient verbalized understanding and agreement to the Pre-Surgical Instructions that were given to them at this PAT appointment. Patient was also educated of the need to review these PAT instructions again prior to his/her surgery.I reviewed the appropriate phone numbers to call if they have any and questions or concerns.

## 2022-10-27 ENCOUNTER — Encounter (HOSPITAL_COMMUNITY)
Admission: RE | Admit: 2022-10-27 | Discharge: 2022-10-27 | Disposition: A | Discharge status: Home / Self Care | Payer: Medicaid Other | Source: Ambulatory Visit | Attending: Orthopedic Surgery | Admitting: Orthopedic Surgery

## 2022-10-27 ENCOUNTER — Other Ambulatory Visit: Payer: Self-pay

## 2022-10-27 ENCOUNTER — Encounter (HOSPITAL_COMMUNITY): Payer: Self-pay

## 2022-10-27 VITALS — BP 140/79 | HR 65 | Temp 97.6°F | Resp 18 | Ht 74.5 in | Wt >= 6400 oz

## 2022-10-27 DIAGNOSIS — I10 Essential (primary) hypertension: Secondary | ICD-10-CM | POA: Diagnosis not present

## 2022-10-27 DIAGNOSIS — Z01812 Encounter for preprocedural laboratory examination: Secondary | ICD-10-CM | POA: Insufficient documentation

## 2022-10-27 DIAGNOSIS — Z01818 Encounter for other preprocedural examination: Secondary | ICD-10-CM

## 2022-10-27 DIAGNOSIS — R7303 Prediabetes: Secondary | ICD-10-CM | POA: Insufficient documentation

## 2022-10-27 HISTORY — DX: Personal history of urinary calculi: Z87.442

## 2022-10-27 LAB — COMPREHENSIVE METABOLIC PANEL
ALT: 18 U/L (ref 0–44)
AST: 17 U/L (ref 15–41)
Albumin: 3.9 g/dL (ref 3.5–5.0)
Alkaline Phosphatase: 75 U/L (ref 38–126)
Anion gap: 8 (ref 5–15)
BUN: 10 mg/dL (ref 6–20)
CO2: 25 mmol/L (ref 22–32)
Calcium: 8.9 mg/dL (ref 8.9–10.3)
Chloride: 105 mmol/L (ref 98–111)
Creatinine, Ser: 0.63 mg/dL (ref 0.44–1.00)
GFR, Estimated: >60 mL/min (ref >60)
Glucose, Bld: 92 mg/dL (ref 70–99)
Potassium: 4.1 mmol/L (ref 3.5–5.1)
Sodium: 138 mmol/L (ref 135–145)
Total Bilirubin: 0.7 mg/dL (ref 0.3–1.2)
Total Protein: 7.8 g/dL (ref 6.5–8.1)

## 2022-10-27 LAB — CBC
HCT: 42.8 % (ref 36.0–46.0)
Hemoglobin: 13.3 g/dL (ref 12.0–15.0)
MCH: 25.2 pg — ABNORMAL LOW (ref 26.0–34.0)
MCHC: 31.1 g/dL (ref 30.0–36.0)
MCV: 81.2 fL (ref 80.0–100.0)
Platelets: 256 10*3/uL (ref 150–400)
RBC: 5.27 MIL/uL — ABNORMAL HIGH (ref 3.87–5.11)
RDW: 13.9 % (ref 11.5–15.5)
WBC: 5.8 10*3/uL (ref 4.0–10.5)
nRBC: 0 % (ref 0.0–0.2)

## 2022-10-28 LAB — HEMOGLOBIN A1C
Hgb A1c MFr Bld: 6.1 % — ABNORMAL HIGH (ref 4.8–5.6)
Mean Plasma Glucose: 128 mg/dL

## 2022-10-31 ENCOUNTER — Ambulatory Visit (HOSPITAL_COMMUNITY)
Admission: RE | Admit: 2022-10-31 | Discharge: 2022-10-31 | Disposition: A | Discharge status: Home / Self Care | Payer: Medicaid Other | Attending: Orthopedic Surgery | Admitting: Orthopedic Surgery

## 2022-10-31 ENCOUNTER — Ambulatory Visit (HOSPITAL_BASED_OUTPATIENT_CLINIC_OR_DEPARTMENT_OTHER): Payer: Medicaid Other

## 2022-10-31 ENCOUNTER — Encounter (HOSPITAL_COMMUNITY)
Admission: RE | Disposition: A | Discharge status: Home / Self Care | Payer: Self-pay | Source: Home / Self Care | Attending: Orthopedic Surgery

## 2022-10-31 ENCOUNTER — Encounter (HOSPITAL_COMMUNITY): Payer: Self-pay | Admitting: Orthopedic Surgery

## 2022-10-31 ENCOUNTER — Other Ambulatory Visit: Payer: Self-pay

## 2022-10-31 ENCOUNTER — Ambulatory Visit (HOSPITAL_COMMUNITY): Payer: Medicaid Other | Admitting: Physician Assistant

## 2022-10-31 DIAGNOSIS — Z6841 Body Mass Index (BMI) 40.0 and over, adult: Secondary | ICD-10-CM | POA: Insufficient documentation

## 2022-10-31 DIAGNOSIS — M222X2 Patellofemoral disorders, left knee: Secondary | ICD-10-CM | POA: Diagnosis not present

## 2022-10-31 DIAGNOSIS — X58XXXA Exposure to other specified factors, initial encounter: Secondary | ICD-10-CM | POA: Insufficient documentation

## 2022-10-31 DIAGNOSIS — G4730 Sleep apnea, unspecified: Secondary | ICD-10-CM | POA: Insufficient documentation

## 2022-10-31 DIAGNOSIS — M659 Synovitis and tenosynovitis, unspecified: Secondary | ICD-10-CM | POA: Insufficient documentation

## 2022-10-31 DIAGNOSIS — S83282A Other tear of lateral meniscus, current injury, left knee, initial encounter: Secondary | ICD-10-CM

## 2022-10-31 DIAGNOSIS — I10 Essential (primary) hypertension: Secondary | ICD-10-CM | POA: Diagnosis not present

## 2022-10-31 DIAGNOSIS — J45909 Unspecified asthma, uncomplicated: Secondary | ICD-10-CM | POA: Insufficient documentation

## 2022-10-31 DIAGNOSIS — Z01818 Encounter for other preprocedural examination: Secondary | ICD-10-CM

## 2022-10-31 DIAGNOSIS — R519 Headache, unspecified: Secondary | ICD-10-CM | POA: Insufficient documentation

## 2022-10-31 DIAGNOSIS — K219 Gastro-esophageal reflux disease without esophagitis: Secondary | ICD-10-CM | POA: Insufficient documentation

## 2022-10-31 DIAGNOSIS — M797 Fibromyalgia: Secondary | ICD-10-CM | POA: Insufficient documentation

## 2022-10-31 DIAGNOSIS — E119 Type 2 diabetes mellitus without complications: Secondary | ICD-10-CM | POA: Diagnosis not present

## 2022-10-31 DIAGNOSIS — M223X2 Other derangements of patella, left knee: Secondary | ICD-10-CM | POA: Diagnosis not present

## 2022-10-31 DIAGNOSIS — R7303 Prediabetes: Secondary | ICD-10-CM

## 2022-10-31 DIAGNOSIS — M228X2 Other disorders of patella, left knee: Secondary | ICD-10-CM | POA: Diagnosis not present

## 2022-10-31 HISTORY — PX: KNEE ARTHROSCOPY WITH LATERAL MENISECTOMY: SHX6193

## 2022-10-31 HISTORY — PX: KNEE ARTHROSCOPY WITH LATERAL RELEASE: SHX5649

## 2022-10-31 LAB — POCT PREGNANCY, URINE: Preg Test, Ur: NEGATIVE

## 2022-10-31 SURGERY — ARTHROSCOPY, KNEE, WITH LATERAL MENISCECTOMY
Anesthesia: General | Site: Knee | Laterality: Left

## 2022-10-31 MED ORDER — SODIUM CHLORIDE 0.9 % IR SOLN
Status: DC | PRN
  Administered 2022-10-31 (×2): 3000 mL

## 2022-10-31 MED ORDER — ONDANSETRON HCL 4 MG PO TABS: 4.0000 mg | ORAL_TABLET | Freq: Three times a day (TID) | ORAL | 0 refills | Status: DC | PRN

## 2022-10-31 MED ORDER — MIDAZOLAM HCL 2 MG/2ML IJ SOLN
INTRAMUSCULAR | Status: AC
  Filled 2022-10-31: qty 2

## 2022-10-31 MED ORDER — CEFAZOLIN IN SODIUM CHLORIDE 3-0.9 GM/100ML-% IV SOLN
3.0000 g | INTRAVENOUS | Status: AC
  Administered 2022-10-31: 3 g via INTRAVENOUS
  Filled 2022-10-31: qty 100

## 2022-10-31 MED ORDER — LIDOCAINE HCL (PF) 2 % IJ SOLN
INTRAMUSCULAR | Status: AC
  Filled 2022-10-31: qty 5

## 2022-10-31 MED ORDER — ORAL CARE MOUTH RINSE: 15.0000 mL | Freq: Once | OROMUCOSAL | Status: AC

## 2022-10-31 MED ORDER — FENTANYL CITRATE PF 50 MCG/ML IJ SOSY
25.0000 ug | PREFILLED_SYRINGE | INTRAMUSCULAR | Status: DC | PRN
  Administered 2022-10-31 (×3): 50 ug via INTRAVENOUS

## 2022-10-31 MED ORDER — PROPOFOL 10 MG/ML IV BOLUS
INTRAVENOUS | Status: DC | PRN
  Administered 2022-10-31: 200 mg via INTRAVENOUS

## 2022-10-31 MED ORDER — PROMETHAZINE HCL 25 MG/ML IJ SOLN: 6.2500 mg | INTRAMUSCULAR | Status: DC | PRN

## 2022-10-31 MED ORDER — ONDANSETRON HCL 4 MG/2ML IJ SOLN
INTRAMUSCULAR | Status: DC | PRN
  Administered 2022-10-31: 4 mg via INTRAVENOUS

## 2022-10-31 MED ORDER — BUPIVACAINE HCL (PF) 0.25 % IJ SOLN
INTRAMUSCULAR | Status: AC
  Filled 2022-10-31: qty 30

## 2022-10-31 MED ORDER — CHLORHEXIDINE GLUCONATE 0.12 % MT SOLN
15.0000 mL | Freq: Once | OROMUCOSAL | Status: AC
  Administered 2022-10-31: 15 mL via OROMUCOSAL

## 2022-10-31 MED ORDER — FENTANYL CITRATE (PF) 100 MCG/2ML IJ SOLN
INTRAMUSCULAR | Status: AC
  Filled 2022-10-31: qty 2

## 2022-10-31 MED ORDER — PROPOFOL 10 MG/ML IV BOLUS
INTRAVENOUS | Status: AC
  Filled 2022-10-31: qty 20

## 2022-10-31 MED ORDER — OXYCODONE-ACETAMINOPHEN 5-325 MG PO TABS: 1.0000 | ORAL_TABLET | Freq: Every day | ORAL | 0 refills | Status: DC

## 2022-10-31 MED ORDER — FENTANYL CITRATE (PF) 100 MCG/2ML IJ SOLN
INTRAMUSCULAR | Status: DC | PRN
  Administered 2022-10-31: 25 ug via INTRAVENOUS
  Administered 2022-10-31: 50 ug via INTRAVENOUS
  Administered 2022-10-31: 25 ug via INTRAVENOUS

## 2022-10-31 MED ORDER — LACTATED RINGERS IV SOLN: INTRAVENOUS | Status: DC

## 2022-10-31 MED ORDER — ACETAMINOPHEN 500 MG PO TABS
1000.0000 mg | ORAL_TABLET | Freq: Once | ORAL | Status: AC
  Administered 2022-10-31: 1000 mg via ORAL
  Filled 2022-10-31: qty 2

## 2022-10-31 MED ORDER — FENTANYL CITRATE PF 50 MCG/ML IJ SOSY
PREFILLED_SYRINGE | INTRAMUSCULAR | Status: AC
  Filled 2022-10-31: qty 3

## 2022-10-31 MED ORDER — MIDAZOLAM HCL 5 MG/5ML IJ SOLN
INTRAMUSCULAR | Status: DC | PRN
  Administered 2022-10-31: 2 mg via INTRAVENOUS

## 2022-10-31 MED ORDER — LIDOCAINE 2% (20 MG/ML) 5 ML SYRINGE
INTRAMUSCULAR | Status: DC | PRN
  Administered 2022-10-31: 100 mg via INTRAVENOUS

## 2022-10-31 MED ORDER — BUPIVACAINE HCL 0.25 % IJ SOLN
INTRAMUSCULAR | Status: DC | PRN
  Administered 2022-10-31: 20 mL

## 2022-10-31 MED ORDER — ONDANSETRON HCL 4 MG/2ML IJ SOLN
INTRAMUSCULAR | Status: AC
  Filled 2022-10-31: qty 2

## 2022-10-31 MED ORDER — SUCCINYLCHOLINE CHLORIDE 200 MG/10ML IV SOSY
PREFILLED_SYRINGE | INTRAVENOUS | Status: DC | PRN
  Administered 2022-10-31: 160 mg via INTRAVENOUS

## 2022-10-31 SURGICAL SUPPLY — 34 items
BAG COUNTER SPONGE SURGICOUNT (BAG) ×1 IMPLANT
BLADE SHAVER TORPEDO 4X13 (MISCELLANEOUS) ×1 IMPLANT
BNDG CONFORM 6X.82 1P STRL (GAUZE/BANDAGES/DRESSINGS) IMPLANT
BNDG ELASTIC 6X5.8 VLCR STR LF (GAUZE/BANDAGES/DRESSINGS) ×1 IMPLANT
COVER SURGICAL LIGHT HANDLE (MISCELLANEOUS) ×1 IMPLANT
CUFF TOURN SGL QUICK 34 (TOURNIQUET CUFF)
CUFF TRNQT CYL 34X4.125X (TOURNIQUET CUFF) IMPLANT
DRAPE ARTHROSCOPY W/POUCH 114 (DRAPES) ×1 IMPLANT
DRAPE SHEET LG 3/4 BI-LAMINATE (DRAPES) IMPLANT
DRAPE U-SHAPE 47X51 STRL (DRAPES) ×1 IMPLANT
DURAPREP 26ML APPLICATOR (WOUND CARE) ×1 IMPLANT
GAUZE 4X4 16PLY ~~LOC~~+RFID DBL (SPONGE) ×1 IMPLANT
GAUZE PAD ABD 8X10 STRL (GAUZE/BANDAGES/DRESSINGS) ×2 IMPLANT
GAUZE SPONGE 4X4 12PLY STRL (GAUZE/BANDAGES/DRESSINGS) ×1 IMPLANT
GLOVE BIO SURGEON STRL SZ7.5 (GLOVE) ×2 IMPLANT
GLOVE BIOGEL PI IND STRL 8 (GLOVE) ×2 IMPLANT
GOWN STRL REUS W/ TWL XL LVL3 (GOWN DISPOSABLE) ×2 IMPLANT
GOWN STRL REUS W/TWL XL LVL3 (GOWN DISPOSABLE) ×2
IV NS IRRIG 3000ML ARTHROMATIC (IV SOLUTION) ×2 IMPLANT
KIT BASIN OR (CUSTOM PROCEDURE TRAY) ×1 IMPLANT
KIT TURNOVER KIT A (KITS) IMPLANT
MANIFOLD NEPTUNE II (INSTRUMENTS) ×1 IMPLANT
PACK ARTHROSCOPY WL (CUSTOM PROCEDURE TRAY) ×1 IMPLANT
PORT APPOLLO RF 90DEGREE MULTI (SURGICAL WAND) IMPLANT
PROBE HOOK APOLLO (SURGICAL WAND) IMPLANT
STRIP CLOSURE SKIN 1/2X4 (GAUZE/BANDAGES/DRESSINGS) ×1 IMPLANT
SUT ETHILON 4 0 PS 2 18 (SUTURE) IMPLANT
SUT MNCRL AB 3-0 PS2 18 (SUTURE) ×1 IMPLANT
TAPE STRIPS DRAPE STRL (GAUZE/BANDAGES/DRESSINGS) IMPLANT
TOWEL OR 17X26 10 PK STRL BLUE (TOWEL DISPOSABLE) ×1 IMPLANT
TUBING ARTHROSCOPY IRRIG 16FT (MISCELLANEOUS) ×1 IMPLANT
WAND APOLLORF SJ50 AR-9845 (SURGICAL WAND) IMPLANT
WATER STERILE IRR 500ML POUR (IV SOLUTION) ×1 IMPLANT
WRAP KNEE MAXI GEL POST OP (GAUZE/BANDAGES/DRESSINGS) ×1 IMPLANT

## 2022-10-31 NOTE — Brief Op Note (Signed)
10/31/2022  1:13 PM  PATIENT:  Maureen Lewis  26 y.o. female  PRE-OPERATIVE DIAGNOSIS:  Left knee lateral meniscus tear, synovitis, lateral patella tracking  POST-OPERATIVE DIAGNOSIS:  Left knee lateral meniscus tear, synovitis, lateral patella tracking  PROCEDURE:  Procedure(s) with comments: KNEE ARTHROSCOPY WITH PARTIAL LATERAL MENISECTOMY (Left) - 75 KNEE ARTHROSCOPY WITH LATERAL RELEASE AND LIMITED SYNOVECTOMY (Left) - 75  SURGEON:  Surgeon(s) and Role:    * Stann Mainland, Elly Modena, MD - Primary  PHYSICIAN ASSISTANT: Jonelle Sidle, PA-C  ANESTHESIA:   local and general  EBL: 20 cc  BLOOD ADMINISTERED:none  DRAINS: none   LOCAL MEDICATIONS USED:  MARCAINE     SPECIMEN:  No Specimen  DISPOSITION OF SPECIMEN:  N/A  COUNTS:  YES  TOURNIQUET:  * No tourniquets in log *  DICTATION: .Note written in EPIC  PLAN OF CARE: Discharge to home after PACU  PATIENT DISPOSITION:  PACU - hemodynamically stable.   Delay start of Pharmacological VTE agent (>24hrs) due to surgical blood loss or risk of bleeding: not applicable

## 2022-10-31 NOTE — Anesthesia Procedure Notes (Signed)
Procedure Name: Intubation Date/Time: 10/31/2022 12:12 PM  Performed by: Victoriano Lain, CRNAPre-anesthesia Checklist: Patient identified, Emergency Drugs available, Suction available, Patient being monitored and Timeout performed Patient Re-evaluated:Patient Re-evaluated prior to induction Oxygen Delivery Method: Circle system utilized Preoxygenation: Pre-oxygenation with 100% oxygen Induction Type: IV induction Ventilation: Mask ventilation without difficulty Laryngoscope Size: Mac and 4 Grade View: Grade I Tube type: Oral Tube size: 7.5 mm Number of attempts: 1 Airway Equipment and Method: Stylet Placement Confirmation: ETT inserted through vocal cords under direct vision, positive ETCO2 and breath sounds checked- equal and bilateral Secured at: 23 cm Tube secured with: Tape Dental Injury: Teeth and Oropharynx as per pre-operative assessment

## 2022-10-31 NOTE — Discharge Instructions (Signed)
Post-operative patient instructions  °Knee Arthroscopy  ° °Ice:  Place intermittent ice or cooler pack over your knee, 30 minutes on and 30 minutes off.  Continue this for the first 72 hours after surgery, then save ice for use after therapy sessions or on more active days.   °Weight:  You may bear weight on your leg as your symptoms allow. °DVT prevention: Perform ankle pumps as able throughout the day on the operative extremity.  Be mobile as possible with ambulation as able.  You should also take an 81 mg aspirin once per day x6 weeks. °Crutches:  Use crutches (or walker) to assist in walking until told to discontinue by your physical therapist or physician. This will help to reduce pain. °Strengthening:  Perform simple thigh squeezes (isometric quad contractions) and straight leg lifts as you are able (3 sets of 5 to 10 repetitions, 3 times a day).  For the leg lifts, have someone support under your ankle in the beginning until you have increased strength enough to do this on your own.  To help get started on thigh squeezes, place a pillow under your knee and push down on the pillow with back of knee (sometimes easier to do than with your leg fully straight). °Motion:  Perform gentle knee motion as tolerated - this is gentle bending and straightening of the knee. Seated heel slides: you can start by sitting in a chair, remove your brace, and gently slide your heel back on the floor - allowing your knee to bend. Have someone help you straighten your knee (or use your other leg/foot hooked under your ankle.  °Dressing:  Perform 1st dressing change at 3 days postoperative. A moderate amount of blood tinged drainage is to be expected.  So if you bleed through the dressing on the first or second day or if you have fevers, it is fine to change the dressing/check the wounds early and redress wound. Elevate your leg.  If it bleeds through again, or if the incisions are leaking frank blood, please call the office. May  change dressing every 1-2 days thereafter to help watch wounds. Can purchase Tegderm (or 3M Nexcare) water resistant dressings at local pharmacy / [facility]. °Shower:  Light shower is ok after 3 days.  Please take shower, NO bath. Recover with gauze and ace wrap to help keep wounds protected.   °Pain medication:  A narcotic pain medication has been prescribed.  Take as directed.  Typically you need narcotic pain medication more regularly during the first 3 to 5 days after surgery.  Decrease your use of the medication as the pain improves.  Narcotics can sometimes cause constipation, even after a few doses.  If you have problems with constipation, you can take an over the counter stool softener or light laxative.  If you have persistent problems, please notify your physician’s office. °Physical therapy: Additional activity guidelines to be provided by your physician or physical therapist at follow-up visits.  °Driving: Do not recommend driving x 1-2 weeks post surgical, especially if surgery performed on right side. Should not drive while taking narcotic pain medications. It typically takes at least 2 weeks to restore sufficient neuromuscular function for normal reaction times for driving safety.  °Call [phone] for questions or problems. Evenings you will be forwarded to the hospital operator.  Ask for the orthopaedic physician on call. Please call if you experience:  °  °Redness, foul smelling, or persistent drainage from the surgical site  °worsening knee pain and   swelling not responsive to medication  °any calf pain and or swelling of the lower leg  °temperatures greater than 101.5 F °other questions or concerns ° ° °Thank you for allowing us to be a part of your care  °

## 2022-10-31 NOTE — Anesthesia Postprocedure Evaluation (Signed)
Anesthesia Post Note  Patient: Maureen Lewis  Procedure(s) Performed: KNEE ARTHROSCOPY WITH PARTIAL LATERAL MENISECTOMY (Left: Knee) KNEE ARTHROSCOPY WITH LATERAL RELEASE AND LIMITED SYNOVECTOMY (Left: Knee)     Patient location during evaluation: PACU Anesthesia Type: General Level of consciousness: awake and alert Pain management: pain level controlled Vital Signs Assessment: post-procedure vital signs reviewed and stable Respiratory status: spontaneous breathing, nonlabored ventilation, respiratory function stable and patient connected to nasal cannula oxygen Cardiovascular status: blood pressure returned to baseline and stable Postop Assessment: no apparent nausea or vomiting Anesthetic complications: no   No notable events documented.  Last Vitals:  Vitals:   10/31/22 1345 10/31/22 1405  BP: (!) 125/52 136/79  Pulse: 64 74  Resp: (!) 9 18  Temp: 36.7 C 37 C  SpO2: 100% 96%    Last Pain:  Vitals:   10/31/22 1405  TempSrc: Oral  PainSc: 0-No pain                 Santa Lighter

## 2022-10-31 NOTE — Op Note (Signed)
Surgery Date: 10/31/2022  Surgeon(s): Nicholes Stairs, MD  Assistant:  Jonelle Sidle, PA-C  Assistant attestation:  PA McClung present for the entire procedure.  He participated in all portions.  ANESTHESIA:  general, 1/4% plain Marcaine x 20 cc  FLUIDS: Per anesthesia record.   ESTIMATED BLOOD LOSS: minimal  PREOPERATIVE DIAGNOSES:  1.  Left knee lateral meniscus tear 2.  Left knee synovitis 3.  Left knee lateral patella femoral maltracking  POSTOPERATIVE DIAGNOSES:  same  PROCEDURES PERFORMED:  1.  Left knee arthroscopy with limited synovectomy 2.  Left knee arthroscopy with arthroscopic partial lateral meniscectomy 3. Left knee arthroscopy with lateral release.  DESCRIPTION OF PROCEDURE: Maureen Lewis is a 27 y.o.-year-old female with left knee lateral meniscus tear. Plans are to proceed with partial lateral meniscectomy and diagnostic arthroscopy with debridement as indicated. Full discussion held regarding risks benefits alternatives and complications related surgical intervention. Conservative care options reviewed. All questions answered.  The patient was identified in the preoperative holding area and the operative extremity was marked. The patient was brought to the operating room and transferred to operating table in a supine position. Satisfactory general anesthesia was induced by anesthesiology.    Standard anterolateral, anteromedial arthroscopy portals were obtained. The anteromedial portal was obtained with a spinal needle for localization under direct visualization with subsequent diagnostic findings.   Anteromedial and anterolateral chambers: mild synovitis. The synovitis was debrided with a 4.5 mm full radius shaver through both the anteromedial and lateral portals.   Suprapatellar pouch and gutters: moderate synovitis or debris. Patella chondral surface: Grade 0 Trochlear chondral surface: Grade 0 Patellofemoral tracking: Lateral tilt with some lateral  translation noted Medial meniscus: Intact.  Medial femoral condyle flexion bearing surface: Grade 0 Medial femoral condyle extension bearing surface: Grade 0 Medial tibial plateau: Grade 0 Anterior cruciate ligament:stable Posterior cruciate ligament:stable Lateral meniscus: Horizontal cleavage tearing noted from mid body to posterior horn.  There was also some short radial tears noted in the 4 articular side of this partial discoid lateral meniscus..   Lateral femoral condyle flexion bearing surface: Grade 0 Lateral femoral condyle extension bearing surface: Grade 0 Lateral tibial plateau: Grade 0  After diagnostic arthroscopy we did perform a limited synovectomy of the anterior compartment only with motorized shaver while viewing lateral and working medial.  This allowed [name] access to the medial and lateral compartments.  Next, we moved ahead with our partial lateral meniscectomy.  She had a partial discoid lateral meniscus with some horizontal tearing in the more central portion.  This was resected with combination of motorized shaver as well as meniscal biter.  We resected back to a reasonable with and also completely remove the undersurface flap of the horizontal tear.  Lastly we performed arthroscopic lateral release.  On her diagnostic arthroscopy she did have obvious lateral tilt as well as lateral translation.  To help correct the lateral tilt we addressed with lateral release.  Utilizing a 90 degree radiofrequency probe we released from the superior pole of the patella down to the inferior pole of the patella through capsule and lateral retinaculum.  Care was taken to address genicular bleeders as encountered.  After completion of synovectomy, diagnostic exam, and debridements as described, all compartments were checked and no residual debris remained. Hemostasis was achieved with the cautery wand. The portals were approximated with buried monocryl. All excess fluid was expressed from the  joint.  Xeroform sterile gauze dressings were applied followed by Ace bandage and ice pack.  DISPOSITION: The patient was awakened from general anesthetic, extubated, taken to the recovery room in medically stable condition, no apparent complications. The patient may be weightbearing as tolerated to the operative lower extremity.  Range of motion of right knee as tolerated.

## 2022-10-31 NOTE — H&P (Signed)
ORTHOPAEDIC H and P  REQUESTING PHYSICIAN: Nicholes Stairs, MD  PCP:  Dorothyann Peng, NP  Chief Complaint: Left knee pain  HPI: Maureen Lewis is a 27 y.o. female who complains of left knee pain, swelling, mechanical catching and locking.  She has had a course of conservative treatment for greater than 6 weeks.  Here today for arthroscopic assisted surgery on the left knee.  No new complaints at this time.  Past Medical History:  Diagnosis Date   Abnormal uterine bleeding    Acid reflux    Alpha thalassemia trait    Amenorrhea    Anemia    no current med.   Asthma    Back pain    Cesarean delivery delivered 10/11/2019   10/09/2019 - primary CS for failed IOL   Chest pain    resolved   Complication of anesthesia    states had to keep giving her anesthesia during EGD   Constipation    Depression    "I'm good"   Fibromyalgia    Finger mass, left 03/2017   middle finger   Gestational hypertension 09/23/2019   Guidelines for Antenatal Testing and Sonography  (with updated ICD-10 codes)  Updated  24-Sep-2019 with Dr. Tama High  INDICATION U/S 2 X week NST/AFI  or full BPP wkly DELIVERY Diabetes   A1 - good control - O24.410    A2 - good control - O24.419      A2  - poor control or poor compliance - O24.419, E11.65   (Macrosomia or polyhydramnios) **E11.65 is extra code for poor control**    A2/B - O24.   Headache    Hepatitis 07/18/2022   Hepatitis B   History of kidney stones    Hypertension    Infection    UTI   Irritable bowel syndrome (IBS)    Joint pain    Morbid obesity with body mass index (BMI) of 45.0 to 49.9 in adult [facility])    Obesity during pregnancy, antepartum 04/06/2019   Body mass index is 53.71 kg/m.  Recommendations '[x]'$  Aspirin 81 mg daily after 12 weeks; discontinue after 36 weeks '[ ]'$  Nutrition consult '[ ]'$  Weight gain 11-20 lbs for singleton and 25-35 lbs for twin pregnancy (IOM guidelines) Higher class of obesity patients recommended  to gain closer to lower limit  Weight loss is associated with adverse outcomes '[ ]'$  Baseline and surveillance labs (pulled in fr   Plantar fasciitis, bilateral    resolved   Polycystic ovary syndrome    Pre-diabetes    no meds, diet controlled, diet not check blood sugar   Pregnancy induced hypertension    Shortness of breath    albuterol inhaler   Sleep apnea    no CPAP use   Supervision of normal first pregnancy 04/06/2019   [name]: [ x]Initial [ x]12 '[ ]'$ 20 '[ ]'$ 28 '[ ]'$ 32 '[ ]'$ 36 '[ ]'$ 38 '[ ]'$ 39 '[ ]'$ 40 Nursing Staff Provider Office Location [facility]  Dating   LMP Language  English  Anatomy US  Nml Flu Vaccine  Declined 07/28/19 Genetic Screen  NIPS: low risks   AFP:   negative  TDaP vaccine   08/25/19 Hgb A1C or  GTT Early A1C 5.8 Third trimester: GDM insulin Rhogam  NA   LAB RESULTS  Feeding Plan Breast Blood Type   Vertigo 2017   Past Surgical History:  Procedure Laterality Date   CESAREAN SECTION N/A 10/09/2019   Procedure: CESAREAN SECTION;  Surgeon: Aletha Halim,  MD;  Location: [facility] LD [facility]S;  Service: Obstetrics;  Laterality: N/A;   COLONOSCOPY WITH PROPOFOL  10/07/2016   DILATION AND CURETTAGE OF UTERUS     ESOPHAGOGASTRODUODENOSCOPY  12/21/2015   EXCISION MASS UPPER EXTREMETIES Left 04/21/2017   Procedure: EXCISION MASS LEFT MIDDLE FINGER;  Surgeon: Daryll Brod, MD;  Location: [place];  Service: Orthopedics;  Laterality: Left;   gastric sleeve  05/09/2022   IR FL GUIDED LOC OF NEEDLE/CATH TIP FOR SPINAL INJECTION RT  03/13/2020   PHOTOCOAGULATION WITH LASER Left 12/13/2020   Procedure: RETINOPLEXY LEFT EYE;  Surgeon: Sherlynn Stalls, MD;  Location: [place];  Service: Ophthalmology;  Laterality: Left;   SCLERAL BUCKLE Right 12/13/2020   Procedure: SCLERAL BUCKLE WITH CRYO;  Surgeon: Sherlynn Stalls, MD;  Location: [place];  Service: Ophthalmology;  Laterality: Right;   Social History   Socioeconomic History   Marital status: Single    Spouse name: Not on file    Number of children: Not on file   Years of education: Not on file   Highest education level: Not on file  Occupational History   Occupation: Chemical engineer    Employer: [employer]  Tobacco Use   Smoking status: Never   Smokeless tobacco: Never  Vaping Use   Vaping Use: Never used  Substance and Sexual Activity   Alcohol use: Not Currently    Comment: social    Drug use: No   Sexual activity: Yes    Birth control/protection: I.U.D.  Other Topics Concern   Not on file  Social History Narrative   Right handed    Soda sometimes   Lives with mom and cousin, a grandmother in [place] also helps care for her.       She is in nursing school.    Social Determinants of Health   Financial Resource Strain: Not on file  Food Insecurity: Not on file  Transportation Needs: Not on file  Physical Activity: Not on file  Stress: Not on file  Social Connections: Not on file   Family History  Problem Relation Age of Onset   Diabetes Maternal Aunt    Hypertension Maternal Uncle    Depression Maternal Grandfather    Diabetes Maternal Grandfather    Hypertension Maternal Grandfather    Arthritis Mother    High blood pressure Mother    Depression Mother    Sleep apnea Mother    Obesity Mother    Diabetes Mother    Hypertension Mother    Kidney failure Mother    Sleep apnea Father    Obesity Father    Healthy Daughter    Allergies  Allergen Reactions   Bee Venom Hives, Shortness Of Breath and Swelling   Hydrocodone-Acetaminophen Anaphylaxis    No problem when she takes Tylenol   Psychologist, counselling, Shortness Of Breath and Other (See Comments)    SWELLING OF MOUTH   Pollen Extract Hives, Shortness Of Breath and Swelling   Remdesivir Swelling    Angioedema 8/25   Latex Rash   Cortisone Itching and Swelling   Cortizone-10 [Hydrocortisone]     Swelling and itching   Toradol [Ketorolac Tromethamine] Swelling   Prior to Admission medications   Medication Sig Start Date End  Date Taking? Authorizing Provider  albuterol (VENTOLIN HFA) 108 (90 Base) MCG/ACT inhaler Inhale 2 puffs into the lungs every 6 (six) hours as needed for wheezing or shortness of breath. 08/19/22  Yes Martinique, Betty G, MD  ALPRAZolam Duanne Moron) 0.5 MG  tablet Take 0.5 mg by mouth daily as needed for anxiety.   Yes [provider]  APPLE CIDER VINEGAR PO Take 1 tablet by mouth daily.   Yes [provider]  Blood Pressure Monitoring (BLOOD PRESSURE MON/AUTO/WRIST) DEVI Use to monitor blood pressure 09/24/21  Yes Nafziger, Tommi Rumps, NP  budesonide-formoterol (SYMBICORT) 160-4.5 MCG/ACT inhaler Inhale 2 puffs into the lungs 2 (two) times daily. 10/03/22  Yes Maryjane Hurter, MD  CALCIUM PO Take 1 tablet by mouth daily.   Yes [provider]  COLLAGEN PO Take 1 tablet by mouth daily.   Yes [provider]  levonorgestrel (MIRENA, 52 MG,) 20 MCG/DAY IUD 1 each by Intrauterine route once. 04/02/22  Yes [provider]  loratadine (CLARITIN) 10 MG tablet Take 10 mg by mouth daily as needed for allergies.    Yes [provider]  magic mouthwash w/lidocaine SOLN Take 5 mLs by mouth 3 (three) times daily as needed (gargle and spit). Patient taking differently: Take 5 mLs by mouth 3 (three) times daily as needed for mouth pain. 07/29/22  Yes Nafziger, Tommi Rumps, NP  Multiple Vitamins-Minerals (BARIATRIC MULTIVITAMINS/IRON PO) Take 1 tablet by mouth daily.   Yes [provider]  Multiple Vitamins-Minerals (HAIR SKIN NAILS PO) Take 1 tablet by mouth daily.   Yes [provider]  naloxone (NARCAN) nasal spray 4 mg/0.1 mL Place 0.4 mg into the nose once. 09/09/22  Yes [provider]  oxyCODONE-acetaminophen (PERCOCET/ROXICET) 5-325 MG tablet Take 1 tablet by mouth 6 (six) times daily. 09/17/22  Yes [provider]  pantoprazole (PROTONIX) 40 MG tablet Take 1 tablet (40 mg total) by mouth daily. 06/11/21  Yes Maryjane Hurter, MD   pregabalin (LYRICA) 75 MG capsule Take 75 mg by mouth at bedtime. 10/07/22  Yes [provider]  tiZANidine (ZANAFLEX) 4 MG tablet Take 4 mg by mouth 3 (three) times daily as needed for muscle spasms. 09/09/22  Yes [provider]  ferrous sulfate 325 (65 FE) MG tablet Take 1 tablet (325 mg total) by mouth every other day. Patient not taking: Reported on 10/21/2022 02/23/20 10/15/22  Dorothyann Peng, NP  ondansetron (ZOFRAN-ODT) 8 MG disintegrating tablet Take 8 mg by mouth every 8 (eight) hours as needed for vomiting or nausea.    [provider]  tamsulosin (FLOMAX) 0.4 MG CAPS capsule Take 1 capsule (0.4 mg total) by mouth daily. 09/10/22   Nafziger, Tommi Rumps, NP  topiramate (TOPAMAX) 50 MG tablet Take 50 mg by mouth 2 (two) times daily. 03/21/21   [provider]   No results found.  Positive ROS: All other systems have been reviewed and were otherwise negative with the exception of those mentioned in the HPI and as above.  Physical Exam: General: Alert, no acute distress, morbid obesity Cardiovascular: No pedal edema Respiratory: No cyanosis, no use of accessory musculature GI: No organomegaly, abdomen is soft and non-tender Skin: No lesions in the area of chief complaint Neurologic: Sensation intact distally Psychiatric: Patient is competent for consent with normal mood and affect Lymphatic: No axillary or cervical lymphadenopathy  MUSCULOSKELETAL: Left lower extremity is warm and well-perfused with no open wounds or lesions.  She is neurovascularly intact throughout  Assessment: 1.  Left knee lateral meniscus tear, initial encounter 2.  Knee patellofemoral maltracking.  Plan: Plan to proceed with arthroscopic assisted left knee partial lateral meniscectomy as well as lateral release.  We again reviewed the risk and benefits of the procedure in detail.  These  include but not limited to bleeding, infection, damage to surrounding nerves and vessels,  stiffness, arthritis, DVT, and the risk of anesthesia.  She has provided informed consent.  -Plan for discharge home postoperatively from PACU.    Nicholes Stairs, MD Cell [phone]    10/31/2022 11:11 AM

## 2022-10-31 NOTE — Anesthesia Preprocedure Evaluation (Addendum)
Anesthesia Evaluation  Patient identified by MRN, date of birth, ID band Patient awake    Reviewed: Allergy & Precautions, NPO status , Patient's Chart, lab work & pertinent test results  Airway Mallampati: II  TM Distance: >3 FB Neck ROM: Full    Dental  (+) Teeth Intact, Dental Advisory Given, Chipped,    Pulmonary asthma , sleep apnea    Pulmonary exam normal breath sounds clear to auscultation       Cardiovascular hypertension, Normal cardiovascular exam Rhythm:Regular Rate:Normal     Neuro/Psych  Headaches PSYCHIATRIC DISORDERS  Depression       GI/Hepatic Neg liver ROS,GERD  Medicated,,  Endo/Other  diabetes, Type 2  Morbid obesity (BMI 50)  Renal/GU negative Renal ROS     Musculoskeletal  (+)  Fibromyalgia -Left knee lateral meniscus tear, synovitis, lateral patella tracking   Abdominal   Peds  Hematology negative hematology ROS (+)   Anesthesia Other Findings Day of surgery medications reviewed with the patient.  Reproductive/Obstetrics negative OB ROS                             Anesthesia Physical Anesthesia Plan  ASA: 4  Anesthesia Plan: General   Post-op Pain Management: Tylenol PO (pre-op)*   Induction: Intravenous  PONV Risk Score and Plan: 3 and Midazolam, Dexamethasone and Ondansetron  Airway Management Planned: Oral ETT  Additional Equipment:   Intra-op Plan:   Post-operative Plan: Extubation in OR  Informed Consent: I have reviewed the patients History and Physical, chart, labs and discussed the procedure including the risks, benefits and alternatives for the proposed anesthesia with the patient or authorized representative who has indicated his/her understanding and acceptance.     Dental advisory given  Plan Discussed with: CRNA  Anesthesia Plan Comments:        Anesthesia Quick Evaluation

## 2022-10-31 NOTE — Transfer of Care (Signed)
Immediate Anesthesia Transfer of Care Note  Patient: Maureen Lewis  Procedure(s) Performed: KNEE ARTHROSCOPY WITH PARTIAL LATERAL MENISECTOMY (Left: Knee) KNEE ARTHROSCOPY WITH LATERAL RELEASE AND LIMITED SYNOVECTOMY (Left: Knee)  Patient Location: PACU  Anesthesia Type:General  Level of Consciousness: awake, alert , oriented, and patient cooperative  Airway & Oxygen Therapy: Patient Spontanous Breathing and Patient connected to face mask oxygen  Post-op Assessment: Report given to RN, Post -op Vital signs reviewed and stable, and Patient moving all extremities  Post vital signs: Reviewed and stable  Last Vitals:  Vitals Value Taken Time  BP 150/74 10/31/22 1305  Temp    Pulse 82 10/31/22 1307  Resp 20 10/31/22 1307  SpO2 100 % 10/31/22 1307  Vitals shown include unvalidated device data.  Last Pain:  Vitals:   10/31/22 1040  TempSrc: Oral  PainSc:       Patients Stated Pain Goal: 3 (09/64/38 3818)  Complications: No notable events documented.

## 2022-11-01 ENCOUNTER — Encounter (HOSPITAL_COMMUNITY): Payer: Self-pay | Admitting: Orthopedic Surgery

## 2022-11-05 ENCOUNTER — Inpatient Hospital Stay: Admission: RE | Admit: 2022-11-05 | Payer: Medicaid Other | Source: Ambulatory Visit

## 2022-11-05 DIAGNOSIS — F439 Reaction to severe stress, unspecified: Secondary | ICD-10-CM | POA: Diagnosis not present

## 2022-11-07 DIAGNOSIS — M25662 Stiffness of left knee, not elsewhere classified: Secondary | ICD-10-CM | POA: Diagnosis not present

## 2022-11-07 DIAGNOSIS — M25562 Pain in left knee: Secondary | ICD-10-CM | POA: Diagnosis not present

## 2022-11-10 ENCOUNTER — Encounter (HOSPITAL_BASED_OUTPATIENT_CLINIC_OR_DEPARTMENT_OTHER): Payer: Self-pay

## 2022-11-10 DIAGNOSIS — M25562 Pain in left knee: Secondary | ICD-10-CM | POA: Insufficient documentation

## 2022-11-10 DIAGNOSIS — Z7951 Long term (current) use of inhaled steroids: Secondary | ICD-10-CM | POA: Diagnosis not present

## 2022-11-10 DIAGNOSIS — Z91040 Latex allergy status: Secondary | ICD-10-CM | POA: Diagnosis not present

## 2022-11-10 DIAGNOSIS — I10 Essential (primary) hypertension: Secondary | ICD-10-CM | POA: Diagnosis not present

## 2022-11-10 DIAGNOSIS — Z20822 Contact with and (suspected) exposure to covid-19: Secondary | ICD-10-CM | POA: Insufficient documentation

## 2022-11-10 DIAGNOSIS — R6883 Chills (without fever): Secondary | ICD-10-CM | POA: Diagnosis not present

## 2022-11-10 DIAGNOSIS — J45909 Unspecified asthma, uncomplicated: Secondary | ICD-10-CM | POA: Insufficient documentation

## 2022-11-10 NOTE — ED Triage Notes (Addendum)
Pt had sx on left knee 10/31/22 States she has developed pain and swelling at surgical site Slight swelling and no redness noted

## 2022-11-11 ENCOUNTER — Emergency Department (HOSPITAL_BASED_OUTPATIENT_CLINIC_OR_DEPARTMENT_OTHER)
Admission: EM | Admit: 2022-11-11 | Discharge: 2022-11-11 | Disposition: A | Discharge status: Home / Self Care | Payer: Medicaid Other | Attending: Emergency Medicine | Admitting: Emergency Medicine

## 2022-11-11 DIAGNOSIS — M25562 Pain in left knee: Secondary | ICD-10-CM

## 2022-11-11 DIAGNOSIS — R6883 Chills (without fever): Secondary | ICD-10-CM

## 2022-11-11 LAB — RESP PANEL BY RT-PCR (RSV, FLU A&B, COVID)  RVPGX2
Influenza A by PCR: NEGATIVE
Influenza B by PCR: NEGATIVE
Resp Syncytial Virus by PCR: NEGATIVE
SARS Coronavirus 2 by RT PCR: NEGATIVE

## 2022-11-11 MED ORDER — OXYCODONE-ACETAMINOPHEN 5-325 MG PO TABS
1.0000 | ORAL_TABLET | Freq: Once | ORAL | Status: AC
  Administered 2022-11-11: 1 via ORAL
  Filled 2022-11-11: qty 1

## 2022-11-11 NOTE — Discharge Instructions (Signed)
You were seen today for knee pain and chills.  Your viral testing is negative.  Follow-up closely with your orthopedist.  At this time your knee exam is very reassuring.  It does not appear acutely infected.

## 2022-11-11 NOTE — ED Provider Notes (Signed)
[place] Provider Note   CSN: [id number] Arrival date & time: 11/10/22  2059     History  Chief Complaint  Patient presents with   Knee Pain    Maureen Lewis is a 27 y.o. female.  HPI     This is a 27 year old female who presents with concerns for ongoing knee pain and fever.  Patient reports that she had knee arthroscopy on 1/12.  Since starting physical therapy she has had increasing knee pain.  She has been taking her pain medications as prescribed.  She noted some redness and swelling of the knee.  She reports temperature up to 100.3 yesterday.  She reports chills and myalgias.  She has not noted any drainage from surgical sites.  No significant upper respiratory symptoms.  Home Medications Prior to Admission medications   Medication Sig Start Date End Date Taking? Authorizing Provider  albuterol (VENTOLIN HFA) 108 (90 Base) MCG/ACT inhaler Inhale 2 puffs into the lungs every 6 (six) hours as needed for wheezing or shortness of breath. 08/19/22   Martinique, Betty G, MD  ALPRAZolam Duanne Moron) 0.5 MG tablet Take 0.5 mg by mouth daily as needed for anxiety.    [provider]  APPLE CIDER VINEGAR PO Take 1 tablet by mouth daily.    [provider]  Blood Pressure Monitoring (BLOOD PRESSURE MON/AUTO/WRIST) DEVI Use to monitor blood pressure 09/24/21   Nafziger, Tommi Rumps, NP  budesonide-formoterol (SYMBICORT) 160-4.5 MCG/ACT inhaler Inhale 2 puffs into the lungs 2 (two) times daily. 10/03/22   Maryjane Hurter, MD  CALCIUM PO Take 1 tablet by mouth daily.    [provider]  COLLAGEN PO Take 1 tablet by mouth daily.    [provider]  ferrous sulfate 325 (65 FE) MG tablet Take 1 tablet (325 mg total) by mouth every other day. Patient not taking: Reported on 10/21/2022 02/23/20 10/15/22  Dorothyann Peng, NP  levonorgestrel (MIRENA, 52 MG,) 20 MCG/DAY IUD 1 each by Intrauterine route once. 04/02/22    [provider]  loratadine (CLARITIN) 10 MG tablet Take 10 mg by mouth daily as needed for allergies.     [provider]  magic mouthwash w/lidocaine SOLN Take 5 mLs by mouth 3 (three) times daily as needed (gargle and spit). Patient taking differently: Take 5 mLs by mouth 3 (three) times daily as needed for mouth pain. 07/29/22   Nafziger, Tommi Rumps, NP  Multiple Vitamins-Minerals (BARIATRIC MULTIVITAMINS/IRON PO) Take 1 tablet by mouth daily.    [provider]  Multiple Vitamins-Minerals (HAIR SKIN NAILS PO) Take 1 tablet by mouth daily.    [provider]  naloxone [facility]) nasal spray 4 mg/0.1 mL Place 0.4 mg into the nose once. 09/09/22   [provider]  ondansetron (ZOFRAN) 4 MG tablet Take 1 tablet (4 mg total) by mouth every 8 (eight) hours as needed. 10/31/22   Nicholes Stairs, MD  ondansetron (ZOFRAN-ODT) 8 MG disintegrating tablet Take 8 mg by mouth every 8 (eight) hours as needed for vomiting or nausea.    [provider]  oxyCODONE-acetaminophen (PERCOCET/ROXICET) 5-325 MG tablet Take 1 tablet by mouth 6 (six) times daily. 10/31/22   Nicholes Stairs, MD  pantoprazole (PROTONIX) 40 MG tablet Take 1 tablet (40 mg total) by mouth daily. 06/11/21   Maryjane Hurter, MD  pregabalin (LYRICA) 75 MG capsule Take 75 mg by mouth at bedtime. 10/07/22   [provider]  tamsulosin (FLOMAX) 0.4  MG CAPS capsule Take 1 capsule (0.4 mg total) by mouth daily. 09/10/22   Nafziger, Tommi Rumps, NP  tiZANidine (ZANAFLEX) 4 MG tablet Take 4 mg by mouth 3 (three) times daily as needed for muscle spasms. 09/09/22   [provider]  topiramate (TOPAMAX) 50 MG tablet Take 50 mg by mouth 2 (two) times daily. 03/21/21   [provider]      Allergies    Bee venom, Hydrocodone-acetaminophen, Peach flavor, Pollen extract, Remdesivir, Latex, Cortisone, Cortizone-10 [hydrocortisone], and Toradol [ketorolac tromethamine]    Review of  Systems   Review of Systems  Constitutional:  Negative for fever.  Respiratory:  Negative for shortness of breath.   Cardiovascular:  Negative for chest pain.  Gastrointestinal:  Negative for abdominal pain.  Musculoskeletal:        Knee pain  All other systems reviewed and are negative.   Physical Exam Updated Vital Signs BP (!) 113/98 (BP Location: Right Arm)   Pulse 84   Temp 98 F (36.7 C)   Resp 18   Ht 1.88 m ('6\' 2"'$ )   Wt (!) 182.8 kg   LMP 10/20/2022 (Exact Date)   SpO2 99%   BMI 51.74 kg/m  Physical Exam Vitals and nursing note reviewed.  Constitutional:      Appearance: She is well-developed. She is obese. She is not ill-appearing.  HENT:     Head: Normocephalic and atraumatic.     Nose: Nose normal.     Mouth/Throat:     Mouth: Mucous membranes are moist.  Eyes:     Pupils: Pupils are equal, round, and reactive to light.  Cardiovascular:     Rate and Rhythm: Normal rate and regular rhythm.  Pulmonary:     Effort: Pulmonary effort is normal. No respiratory distress.  Abdominal:     Palpations: Abdomen is soft.  Musculoskeletal:     Cervical back: Neck supple.     Comments: Focused examination of the left knee with no significant erythema, surgical incisions appear clean dry and intact, no drainage, no erythema, no warmth, normal range of motion  Skin:    General: Skin is warm and dry.  Neurological:     Mental Status: She is alert and oriented to person, place, and time.  Psychiatric:        Mood and Affect: Mood normal.     ED Results / Procedures / Treatments   Labs (all labs ordered are listed, but only abnormal results are displayed) Labs Reviewed  RESP PANEL BY RT-PCR (RSV, FLU A&B, COVID)  [id number]    EKG None  Radiology No results found.  Procedures Procedures    Medications Ordered in ED Medications  oxyCODONE-acetaminophen (PERCOCET/ROXICET) 5-325 MG per tablet 1 tablet (1 tablet Oral Given 11/11/22 0112)    ED Course/ Medical  Decision Making/ A&P                             Medical Decision Making Risk Prescription drug management.   This patient presents to the ED for concern of knee pain, this involves an extensive number of treatment options, and is a complaint that carries with it a high risk of complications and morbidity.  I considered the following differential and admission for this acute, potentially life threatening condition.  The differential diagnosis includes postsurgical pain, arthritis, infection  MDM:    This is a 27 year old female who presents with left knee pain.  She is overall  nontoxic.  Initially tachycardic but significant pain.  Repeat heart rate 84.  Her left knee does not appear red or erythematous, she has normal range of motion without significant pain.  Surgical sites without drainage.  Highly doubt infection.  She does report chills and temperature to 100.3.  Viral testing was sent and is negative.  Patient reassured.  She can follow-up with her orthopedist.  Have low suspicion for bacterial infection or surgical site infection.  Do not feel she needs imaging as I have low suspicion for fracture or effusion.  (Labs, imaging, consults)  Labs: I Ordered, and personally interpreted labs.  The pertinent results include: COVID, influenza  Imaging Studies ordered: I ordered imaging studies including none I independently visualized and interpreted imaging. I agree with the radiologist interpretation  Additional history obtained from chart review.  External records from outside source obtained and reviewed including OR notes  Cardiac Monitoring: The patient was maintained on a cardiac monitor.  I personally viewed and interpreted the cardiac monitored which showed an underlying rhythm of: Sinus rhythm  Reevaluation: After the interventions noted above, I reevaluated the patient and found that they have :improved  Social Determinants of Health:  lives independently  Disposition:  Discharge  Co morbidities that complicate the patient evaluation  Past Medical History:  Diagnosis Date   Abnormal uterine bleeding    Acid reflux    Alpha thalassemia trait    Amenorrhea    Anemia    no current med.   Asthma    Back pain    Cesarean delivery delivered 10/11/2019   10/09/2019 - primary CS for failed IOL   Chest pain    resolved   Complication of anesthesia    states had to keep giving her anesthesia during EGD   Constipation    Depression    "I'm good"   Fibromyalgia    Finger mass, left 03/2017   middle finger   Gestational hypertension 09/23/2019   Guidelines for Antenatal Testing and Sonography  (with updated ICD-10 codes)  Updated  09/24/2019 with Dr. Tama High  INDICATION U/S 2 X week NST/AFI  or full BPP wkly DELIVERY Diabetes   A1 - good control - O24.410    A2 - good control - O24.419      A2  - poor control or poor compliance - O24.419, E11.65   (Macrosomia or polyhydramnios) **E11.65 is extra code for poor control**    A2/B - O24.   Headache    Hepatitis 07/18/2022   Hepatitis B   History of kidney stones    Hypertension    Infection    UTI   Irritable bowel syndrome (IBS)    Joint pain    Morbid obesity with body mass index (BMI) of 45.0 to 49.9 in adult [facility])    Obesity during pregnancy, antepartum 04/06/2019   Body mass index is 53.71 kg/m.  Recommendations '[x]'$  Aspirin 81 mg daily after 12 weeks; discontinue after 36 weeks '[ ]'$  Nutrition consult '[ ]'$  Weight gain 11-20 lbs for singleton and 25-35 lbs for twin pregnancy (IOM guidelines) Higher class of obesity patients recommended to gain closer to lower limit  Weight loss is associated with adverse outcomes '[ ]'$  Baseline and surveillance labs (pulled in fr   Plantar fasciitis, bilateral    resolved   Polycystic ovary syndrome    Pre-diabetes    no meds, diet controlled, diet not check blood sugar   Pregnancy induced hypertension    Shortness of breath  albuterol inhaler   Sleep apnea     no CPAP use   Supervision of normal first pregnancy 04/06/2019   [name]: [ x]Initial [ x]12 '[ ]'$ 20 '[ ]'$ 28 '[ ]'$ 32 '[ ]'$ 36 '[ ]'$ 38 '[ ]'$ 39 '[ ]'$ 40 Nursing Staff Provider Office Location [facility]  Dating   LMP Language  English  Anatomy US  Nml Flu Vaccine  Declined 07/28/19 Genetic Screen  NIPS: low risks   AFP:   negative  TDaP vaccine   08/25/19 Hgb A1C or  GTT Early A1C 5.8 Third trimester: GDM insulin Rhogam  NA   LAB RESULTS  Feeding Plan Breast Blood Type   Vertigo 2017     Medicines Meds ordered this encounter  Medications   oxyCODONE-acetaminophen (PERCOCET/ROXICET) 5-325 MG per tablet 1 tablet    I have reviewed the patients home medicines and have made adjustments as needed  Problem List / ED Course: Problem List Items Addressed This Visit   None Visit Diagnoses     Acute pain of left knee    -  Primary   Chills                       Final Clinical Impression(s) / ED Diagnoses Final diagnoses:  Acute pain of left knee  Chills    Rx / DC Orders ED Discharge Orders     None         Merryl Hacker, MD 11/11/22 [phone]

## 2022-11-12 ENCOUNTER — Encounter: Payer: Self-pay | Admitting: Adult Health

## 2022-11-12 ENCOUNTER — Ambulatory Visit (INDEPENDENT_AMBULATORY_CARE_PROVIDER_SITE_OTHER): Payer: Medicaid Other | Admitting: Adult Health

## 2022-11-12 VITALS — BP 132/82 | HR 73 | Temp 98.1°F | Ht 74.0 in | Wt 395.0 lb

## 2022-11-12 DIAGNOSIS — R635 Abnormal weight gain: Secondary | ICD-10-CM | POA: Diagnosis not present

## 2022-11-12 DIAGNOSIS — M546 Pain in thoracic spine: Secondary | ICD-10-CM | POA: Diagnosis not present

## 2022-11-12 DIAGNOSIS — K649 Unspecified hemorrhoids: Secondary | ICD-10-CM

## 2022-11-12 DIAGNOSIS — M545 Low back pain, unspecified: Secondary | ICD-10-CM | POA: Diagnosis not present

## 2022-11-12 DIAGNOSIS — E559 Vitamin D deficiency, unspecified: Secondary | ICD-10-CM | POA: Diagnosis not present

## 2022-11-12 DIAGNOSIS — H6690 Otitis media, unspecified, unspecified ear: Secondary | ICD-10-CM

## 2022-11-12 DIAGNOSIS — M25562 Pain in left knee: Secondary | ICD-10-CM | POA: Diagnosis not present

## 2022-11-12 DIAGNOSIS — N915 Oligomenorrhea, unspecified: Secondary | ICD-10-CM | POA: Diagnosis not present

## 2022-11-12 DIAGNOSIS — Z6841 Body Mass Index (BMI) 40.0 and over, adult: Secondary | ICD-10-CM | POA: Diagnosis not present

## 2022-11-12 DIAGNOSIS — Z79899 Other long term (current) drug therapy: Secondary | ICD-10-CM | POA: Diagnosis not present

## 2022-11-12 DIAGNOSIS — S83282A Other tear of lateral meniscus, current injury, left knee, initial encounter: Secondary | ICD-10-CM | POA: Diagnosis not present

## 2022-11-12 DIAGNOSIS — M25571 Pain in right ankle and joints of right foot: Secondary | ICD-10-CM | POA: Diagnosis not present

## 2022-11-12 DIAGNOSIS — R7303 Prediabetes: Secondary | ICD-10-CM | POA: Diagnosis not present

## 2022-11-12 MED ORDER — FLUCONAZOLE 150 MG PO TABS: 150.0000 mg | ORAL_TABLET | Freq: Every day | ORAL | 0 refills | Status: DC

## 2022-11-12 MED ORDER — AMOXICILLIN-POT CLAVULANATE 875-125 MG PO TABS: 1.0000 | ORAL_TABLET | Freq: Two times a day (BID) | ORAL | 0 refills | Status: AC

## 2022-11-12 MED ORDER — HYDROCORTISONE ACETATE 25 MG RE SUPP: 25.0000 mg | Freq: Two times a day (BID) | RECTAL | 0 refills | Status: DC

## 2022-11-12 MED ORDER — FLUTICASONE PROPIONATE 50 MCG/ACT NA SUSP: 2.0000 | Freq: Every day | NASAL | 6 refills | Status: DC

## 2022-11-12 NOTE — Progress Notes (Signed)
Subjective:    Patient ID: Maureen Lewis, female    DOB: [birth year], 27 y.o.   MRN: [id number]  HPI 27 year old female who  has a past medical history of Abnormal uterine bleeding, Acid reflux, Alpha thalassemia trait, Amenorrhea, Anemia, Asthma, Back pain, Cesarean delivery delivered (10/11/2019), Chest pain, Complication of anesthesia, Constipation, Depression, Fibromyalgia, Finger mass, left (03/2017), Gestational hypertension (09/23/2019), Headache, Hepatitis (07/18/2022), History of kidney stones, Hypertension, Infection, Irritable bowel syndrome (IBS), Joint pain, Morbid obesity with body mass index (BMI) of 45.0 to 49.9 in adult [facility]), Obesity during pregnancy, antepartum (04/06/2019), Plantar fasciitis, bilateral, Polycystic ovary syndrome, Pre-diabetes, Pregnancy induced hypertension, Shortness of breath, Sleep apnea, Supervision of normal first pregnancy (04/06/2019), and Vertigo (2017).  He presents to the office today for multiple issues.   She reports that over the last 4-5 days she has noticed blood in her stool. She is seeing bright red blood in her stool and on the toilet paper. She denies itching or burning currently but does report report discomfort. She is not constipated. Has a history of hemorrhoids and feels as though this is where the blood and discomfort is coming from.   She reports having chills and subjective fever and right ear pain x 1 week.. She denies drainage from her ear. Has noticed that that when she lays on her right side the pain is worse.    Review of Systems See HPI   Past Medical History:  Diagnosis Date   Abnormal uterine bleeding    Acid reflux    Alpha thalassemia trait    Amenorrhea    Anemia    no current med.   Asthma    Back pain    Cesarean delivery delivered 10/11/2019   10/09/2019 - primary CS for failed IOL   Chest pain    resolved   Complication of anesthesia    states had to keep giving her anesthesia during EGD    Constipation    Depression    "I'm good"   Fibromyalgia    Finger mass, left 03/2017   middle finger   Gestational hypertension 09/23/2019   Guidelines for Antenatal Testing and Sonography  (with updated ICD-10 codes)  Updated  07-Oct-2019 with Dr. Tama High  INDICATION U/S 2 X week NST/AFI  or full BPP wkly DELIVERY Diabetes   A1 - good control - O24.410    A2 - good control - O24.419      A2  - poor control or poor compliance - O24.419, E11.65   (Macrosomia or polyhydramnios) **E11.65 is extra code for poor control**    A2/B - O24.   Headache    Hepatitis 07/18/2022   Hepatitis B   History of kidney stones    Hypertension    Infection    UTI   Irritable bowel syndrome (IBS)    Joint pain    Morbid obesity with body mass index (BMI) of 45.0 to 49.9 in adult [facility])    Obesity during pregnancy, antepartum 04/06/2019   Body mass index is 53.71 kg/m.  Recommendations '[x]'$  Aspirin 81 mg daily after 12 weeks; discontinue after 36 weeks '[ ]'$  Nutrition consult '[ ]'$  Weight gain 11-20 lbs for singleton and 25-35 lbs for twin pregnancy (IOM guidelines) Higher class of obesity patients recommended to gain closer to lower limit  Weight loss is associated with adverse outcomes '[ ]'$  Baseline and surveillance labs (pulled in fr   Plantar fasciitis, bilateral    resolved  Polycystic ovary syndrome    Pre-diabetes    no meds, diet controlled, diet not check blood sugar   Pregnancy induced hypertension    Shortness of breath    albuterol inhaler   Sleep apnea    no CPAP use   Supervision of normal first pregnancy 04/06/2019   [name]: [ x]Initial [ x]12 '[ ]'$ 20 '[ ]'$ 28 '[ ]'$ 32 '[ ]'$ 36 '[ ]'$ 38 '[ ]'$ 39 '[ ]'$ 40 Nursing Staff Provider Office Location [facility]  Dating   LMP Language  English  Anatomy US  Nml Flu Vaccine  Declined 07/28/19 Genetic Screen  NIPS: low risks   AFP:   negative  TDaP vaccine   08/25/19 Hgb A1C or  GTT Early A1C 5.8 Third trimester: GDM insulin Rhogam  NA   LAB RESULTS  Feeding  Plan Breast Blood Type   Vertigo 2017    Social History   Socioeconomic History   Marital status: Single    Spouse name: Not on file   Number of children: Not on file   Years of education: Not on file   Highest education level: Not on file  Occupational History   Occupation: Chemical engineer    Employer: [id number]  Tobacco Use   Smoking status: Never   Smokeless tobacco: Never  Vaping Use   Vaping Use: Never used  Substance and Sexual Activity   Alcohol use: Not Currently    Comment: social    Drug use: No   Sexual activity: Yes    Birth control/protection: I.U.D.  Other Topics Concern   Not on file  Social History Narrative   Right handed    Soda sometimes   Lives with mom and cousin, a grandmother in [place] also helps care for her.       She is in nursing school.    Social Determinants of Health   Financial Resource Strain: Not on file  Food Insecurity: Not on file  Transportation Needs: Not on file  Physical Activity: Not on file  Stress: Not on file  Social Connections: Not on file  Intimate Partner Violence: Not on file    Past Surgical History:  Procedure Laterality Date   CESAREAN SECTION N/A 10/09/2019   Procedure: CESAREAN SECTION;  Surgeon: Aletha Halim, MD;  Location: [facility];  Service: Obstetrics;  Laterality: N/A;   COLONOSCOPY WITH PROPOFOL  10/07/2016   DILATION AND CURETTAGE OF UTERUS     ESOPHAGOGASTRODUODENOSCOPY  12/21/2015   EXCISION MASS UPPER EXTREMETIES Left 04/21/2017   Procedure: EXCISION MASS LEFT MIDDLE FINGER;  Surgeon: Daryll Brod, MD;  Location: [facility];  Service: Orthopedics;  Laterality: Left;   gastric sleeve  05/09/2022   IR FL GUIDED LOC OF NEEDLE/CATH TIP FOR SPINAL INJECTION RT  03/13/2020   KNEE ARTHROSCOPY WITH LATERAL MENISECTOMY Left 10/31/2022   Procedure: KNEE ARTHROSCOPY WITH PARTIAL LATERAL MENISECTOMY;  Surgeon: Nicholes Stairs, MD;  Location: [facility];  Service: Orthopedics;   Laterality: Left;  75   KNEE ARTHROSCOPY WITH LATERAL RELEASE Left 10/31/2022   Procedure: KNEE ARTHROSCOPY WITH LATERAL RELEASE AND LIMITED SYNOVECTOMY;  Surgeon: Nicholes Stairs, MD;  Location: [facility];  Service: Orthopedics;  Laterality: Left;  75   PHOTOCOAGULATION WITH LASER Left 12/13/2020   Procedure: RETINOPLEXY LEFT EYE;  Surgeon: Sherlynn Stalls, MD;  Location: [facility];  Service: Ophthalmology;  Laterality: Left;   SCLERAL BUCKLE Right 12/13/2020   Procedure: SCLERAL BUCKLE WITH CRYO;  Surgeon: Sherlynn Stalls, MD;  Location: [facility];  Service:  Ophthalmology;  Laterality: Right;    Family History  Problem Relation Age of Onset   Diabetes Maternal Aunt    Hypertension Maternal Uncle    Depression Maternal Grandfather    Diabetes Maternal Grandfather    Hypertension Maternal Grandfather    Arthritis Mother    High blood pressure Mother    Depression Mother    Sleep apnea Mother    Obesity Mother    Diabetes Mother    Hypertension Mother    Kidney failure Mother    Sleep apnea Father    Obesity Father    Healthy Daughter     Allergies  Allergen Reactions   Bee Venom Hives, Shortness Of Breath and Swelling   Hydrocodone-Acetaminophen Anaphylaxis    No problem when she takes Tylenol   Psychologist, counselling, Shortness Of Breath and Other (See Comments)    SWELLING OF MOUTH   Pollen Extract Hives, Shortness Of Breath and Swelling   Remdesivir Swelling    Angioedema 8/25   Latex Rash   Cortisone Itching and Swelling   Cortizone-10 [Hydrocortisone]     Swelling and itching   Toradol [Ketorolac Tromethamine] Swelling    Current Outpatient Medications on File Prior to Visit  Medication Sig Dispense Refill   albuterol (VENTOLIN HFA) 108 (90 Base) MCG/ACT inhaler Inhale 2 puffs into the lungs every 6 (six) hours as needed for wheezing or shortness of breath. 8 g 0   ALPRAZolam (XANAX) 0.5 MG tablet Take 0.5 mg by mouth daily as needed for anxiety.     APPLE CIDER VINEGAR PO  Take 1 tablet by mouth daily.     Blood Pressure Monitoring (BLOOD PRESSURE MON/AUTO/WRIST) DEVI Use to monitor blood pressure 1 each 0   budesonide-formoterol (SYMBICORT) 160-4.5 MCG/ACT inhaler Inhale 2 puffs into the lungs 2 (two) times daily. 1 each 6   CALCIUM PO Take 1 tablet by mouth daily.     COLLAGEN PO Take 1 tablet by mouth daily.     levonorgestrel (MIRENA, 52 MG,) 20 MCG/DAY IUD 1 each by Intrauterine route once.     loratadine (CLARITIN) 10 MG tablet Take 10 mg by mouth daily as needed for allergies.      magic mouthwash w/lidocaine SOLN Take 5 mLs by mouth 3 (three) times daily as needed (gargle and spit). (Patient taking differently: Take 5 mLs by mouth 3 (three) times daily as needed for mouth pain.) 180 mL 0   Multiple Vitamins-Minerals (BARIATRIC MULTIVITAMINS/IRON PO) Take 1 tablet by mouth daily.     Multiple Vitamins-Minerals (HAIR SKIN NAILS PO) Take 1 tablet by mouth daily.     naloxone (NARCAN) nasal spray 4 mg/0.1 mL Place 0.4 mg into the nose once.     ondansetron (ZOFRAN) 4 MG tablet Take 1 tablet (4 mg total) by mouth every 8 (eight) hours as needed. 20 tablet 0   ondansetron (ZOFRAN-ODT) 8 MG disintegrating tablet Take 8 mg by mouth every 8 (eight) hours as needed for vomiting or nausea.     oxyCODONE-acetaminophen (PERCOCET/ROXICET) 5-325 MG tablet Take 1 tablet by mouth 6 (six) times daily. 30 tablet 0   pantoprazole (PROTONIX) 40 MG tablet Take 1 tablet (40 mg total) by mouth daily. 90 tablet 0   pregabalin (LYRICA) 75 MG capsule Take 75 mg by mouth at bedtime.     tamsulosin (FLOMAX) 0.4 MG CAPS capsule Take 1 capsule (0.4 mg total) by mouth daily. 30 capsule 3   tiZANidine (ZANAFLEX) 4 MG tablet Take 4 mg  by mouth 3 (three) times daily as needed for muscle spasms.     topiramate (TOPAMAX) 50 MG tablet Take 50 mg by mouth 2 (two) times daily.     ferrous sulfate 325 (65 FE) MG tablet Take 1 tablet (325 mg total) by mouth every other day. (Patient not taking:  Reported on 10/21/2022) 45 tablet 1   No current facility-administered medications on file prior to visit.    BP 132/82   Pulse 73   Temp 98.1 F (36.7 C) (Oral)   Ht '6\' 2"'$  (1.88 m)   Wt (!) 395 lb (179.2 kg)   LMP 10/20/2022 (Exact Date)   SpO2 97%   BMI 50.71 kg/m       Objective:   Physical Exam Vitals and nursing note reviewed.  Constitutional:      Appearance: Normal appearance. She is obese.  HENT:     Right Ear: Tenderness present. Tympanic membrane is erythematous and bulging.     Left Ear: Tympanic membrane is not erythematous or bulging.  Cardiovascular:     Rate and Rhythm: Normal rate and regular rhythm.     Pulses: Normal pulses.     Heart sounds: Normal heart sounds.  Pulmonary:     Effort: Pulmonary effort is normal.     Breath sounds: Normal breath sounds.  Skin:    Capillary Refill: Capillary refill takes less than 2 seconds.  Neurological:     General: No focal deficit present.     Mental Status: She is alert and oriented to person, place, and time.  Psychiatric:        Mood and Affect: Mood normal.        Behavior: Behavior normal.        Thought Content: Thought content normal.        Judgment: Judgment normal.       Assessment & Plan:  1. Hemorrhoids, unspecified hemorrhoid type  - hydrocortisone (ANUSOL-HC) 25 MG suppository; Place 1 suppository (25 mg total) rectally 2 (two) times daily.  Dispense: 12 suppository; Refill: 0 - Did not want rectal exam today 2. Acute otitis media, unspecified otitis media type  - amoxicillin-clavulanate (AUGMENTIN) 875-125 MG tablet; Take 1 tablet by mouth 2 (two) times daily for 10 days.  Dispense: 20 tablet; Refill: 0 - fluticasone (FLONASE) 50 MCG/ACT nasal spray; Place 2 sprays into both nostrils daily.  Dispense: 16 g; Refill: 6 - Follow up if not resolved in the next 2-3 days   Dorothyann Peng, NP

## 2022-11-14 ENCOUNTER — Encounter: Payer: Self-pay | Admitting: Adult Health

## 2022-11-14 DIAGNOSIS — M25562 Pain in left knee: Secondary | ICD-10-CM | POA: Diagnosis not present

## 2022-11-14 DIAGNOSIS — Z79899 Other long term (current) drug therapy: Secondary | ICD-10-CM | POA: Diagnosis not present

## 2022-11-14 NOTE — Telephone Encounter (Signed)
Pt advised to go to UC for assisted and verbalized understanding.

## 2022-11-15 ENCOUNTER — Ambulatory Visit
Admission: EM | Admit: 2022-11-15 | Discharge: 2022-11-15 | Disposition: A | Discharge status: Home / Self Care | Payer: Medicaid Other | Attending: Nurse Practitioner | Admitting: Nurse Practitioner

## 2022-11-15 ENCOUNTER — Emergency Department (HOSPITAL_BASED_OUTPATIENT_CLINIC_OR_DEPARTMENT_OTHER): Payer: Medicaid Other

## 2022-11-15 ENCOUNTER — Encounter: Payer: Self-pay | Admitting: Emergency Medicine

## 2022-11-15 ENCOUNTER — Emergency Department (HOSPITAL_BASED_OUTPATIENT_CLINIC_OR_DEPARTMENT_OTHER)
Admission: EM | Admit: 2022-11-15 | Discharge: 2022-11-15 | Disposition: A | Discharge status: Home / Self Care | Payer: Medicaid Other | Attending: Emergency Medicine | Admitting: Emergency Medicine

## 2022-11-15 ENCOUNTER — Other Ambulatory Visit: Payer: Self-pay

## 2022-11-15 DIAGNOSIS — N760 Acute vaginitis: Secondary | ICD-10-CM | POA: Insufficient documentation

## 2022-11-15 DIAGNOSIS — A499 Bacterial infection, unspecified: Secondary | ICD-10-CM | POA: Diagnosis not present

## 2022-11-15 DIAGNOSIS — Z20822 Contact with and (suspected) exposure to covid-19: Secondary | ICD-10-CM | POA: Diagnosis not present

## 2022-11-15 DIAGNOSIS — Z91040 Latex allergy status: Secondary | ICD-10-CM | POA: Diagnosis not present

## 2022-11-15 DIAGNOSIS — H6691 Otitis media, unspecified, right ear: Secondary | ICD-10-CM | POA: Insufficient documentation

## 2022-11-15 DIAGNOSIS — R1032 Left lower quadrant pain: Secondary | ICD-10-CM

## 2022-11-15 DIAGNOSIS — B9689 Other specified bacterial agents as the cause of diseases classified elsewhere: Secondary | ICD-10-CM

## 2022-11-15 DIAGNOSIS — R109 Unspecified abdominal pain: Secondary | ICD-10-CM | POA: Diagnosis not present

## 2022-11-15 DIAGNOSIS — K654 Sclerosing mesenteritis: Secondary | ICD-10-CM

## 2022-11-15 DIAGNOSIS — R1010 Upper abdominal pain, unspecified: Secondary | ICD-10-CM | POA: Diagnosis present

## 2022-11-15 DIAGNOSIS — H6501 Acute serous otitis media, right ear: Secondary | ICD-10-CM | POA: Diagnosis not present

## 2022-11-15 DIAGNOSIS — H6690 Otitis media, unspecified, unspecified ear: Secondary | ICD-10-CM

## 2022-11-15 LAB — URINALYSIS, ROUTINE W REFLEX MICROSCOPIC
Bilirubin Urine: NEGATIVE
Glucose, UA: NEGATIVE mg/dL
Ketones, ur: NEGATIVE mg/dL
Leukocytes,Ua: NEGATIVE
Nitrite: NEGATIVE
Protein, ur: 30 mg/dL — AB
RBC / HPF: >50 RBC/hpf (ref 0–5)
Specific Gravity, Urine: 1.034 — ABNORMAL HIGH (ref 1.005–1.030)
pH: 6 (ref 5.0–8.0)

## 2022-11-15 LAB — POCT URINALYSIS DIP (MANUAL ENTRY)
Blood, UA: NEGATIVE
Glucose, UA: NEGATIVE mg/dL
Leukocytes, UA: NEGATIVE
Nitrite, UA: NEGATIVE
Protein Ur, POC: 30 mg/dL — AB
Spec Grav, UA: >=1.03 — AB (ref 1.010–1.025)
Urobilinogen, UA: 2 E.U./dL — AB
pH, UA: 6 (ref 5.0–8.0)

## 2022-11-15 LAB — COMPREHENSIVE METABOLIC PANEL
ALT: 22 U/L (ref 0–44)
AST: 25 U/L (ref 15–41)
Albumin: 4.2 g/dL (ref 3.5–5.0)
Alkaline Phosphatase: 84 U/L (ref 38–126)
Anion gap: 9 (ref 5–15)
BUN: 7 mg/dL (ref 6–20)
CO2: 28 mmol/L (ref 22–32)
Calcium: 9.3 mg/dL (ref 8.9–10.3)
Chloride: 103 mmol/L (ref 98–111)
Creatinine, Ser: 0.66 mg/dL (ref 0.44–1.00)
GFR, Estimated: >60 mL/min (ref >60)
Glucose, Bld: 96 mg/dL (ref 70–99)
Potassium: 3.7 mmol/L (ref 3.5–5.1)
Sodium: 140 mmol/L (ref 135–145)
Total Bilirubin: 0.9 mg/dL (ref 0.3–1.2)
Total Protein: 8 g/dL (ref 6.5–8.1)

## 2022-11-15 LAB — LACTIC ACID, PLASMA: Lactic Acid, Venous: 1 mmol/L (ref 0.5–1.9)

## 2022-11-15 LAB — CBC
HCT: 39.7 % (ref 36.0–46.0)
Hemoglobin: 12.7 g/dL (ref 12.0–15.0)
MCH: 24.8 pg — ABNORMAL LOW (ref 26.0–34.0)
MCHC: 32 g/dL (ref 30.0–36.0)
MCV: 77.4 fL — ABNORMAL LOW (ref 80.0–100.0)
Platelets: 182 10*3/uL (ref 150–400)
RBC: 5.13 MIL/uL — ABNORMAL HIGH (ref 3.87–5.11)
RDW: 13.9 % (ref 11.5–15.5)
WBC: 4.4 10*3/uL (ref 4.0–10.5)
nRBC: 0 % (ref 0.0–0.2)

## 2022-11-15 LAB — LIPASE, BLOOD: Lipase: <10 U/L — ABNORMAL LOW (ref 11–51)

## 2022-11-15 LAB — PREGNANCY, URINE: Preg Test, Ur: NEGATIVE

## 2022-11-15 LAB — WET PREP, GENITAL
Sperm: NONE SEEN
Trich, Wet Prep: NONE SEEN
WBC, Wet Prep HPF POC: <10 (ref <10)
Yeast Wet Prep HPF POC: NONE SEEN

## 2022-11-15 LAB — RESP PANEL BY RT-PCR (RSV, FLU A&B, COVID)  RVPGX2
Influenza A by PCR: NEGATIVE
Influenza B by PCR: NEGATIVE
Resp Syncytial Virus by PCR: NEGATIVE
SARS Coronavirus 2 by RT PCR: NEGATIVE

## 2022-11-15 LAB — POCT URINE PREGNANCY: Preg Test, Ur: NEGATIVE

## 2022-11-15 MED ORDER — ACETAMINOPHEN 325 MG PO TABS
650.0000 mg | ORAL_TABLET | Freq: Once | ORAL | Status: AC
  Administered 2022-11-15: 650 mg via ORAL
  Filled 2022-11-15: qty 2

## 2022-11-15 MED ORDER — PREDNISONE 20 MG PO TABS: 40.0000 mg | ORAL_TABLET | Freq: Every day | ORAL | 0 refills | Status: DC

## 2022-11-15 MED ORDER — IOHEXOL 300 MG/ML  SOLN
100.0000 mL | Freq: Once | INTRAMUSCULAR | Status: AC | PRN
  Administered 2022-11-15: 100 mL via INTRAVENOUS

## 2022-11-15 MED ORDER — METRONIDAZOLE 500 MG PO TABS: 500.0000 mg | ORAL_TABLET | Freq: Two times a day (BID) | ORAL | 0 refills | Status: DC

## 2022-11-15 MED ORDER — LACTATED RINGERS IV BOLUS
1000.0000 mL | Freq: Once | INTRAVENOUS | Status: AC
  Administered 2022-11-15: 1000 mL via INTRAVENOUS

## 2022-11-15 NOTE — ED Provider Notes (Signed)
[facility]    CSN: [id number] Arrival date & time: 11/15/22  0909      History   Chief Complaint Chief Complaint  Patient presents with   Abdominal Pain    HPI Maureen Lewis is a 27 y.o. female who presents for abdominal pain.  Patient reports 4 days of a waxing and waning left upper abdominal pain that radiates to her left lower abdomen.  Pain is a sharp/pulling sensation that is worsening.  Currently rates it as a 9 out of 10.  Occurs 2-3 times an hour.  Is associated with nausea and vomiting.  Denies any diarrhea, fevers or chills.  Has a history of gastric sleeve but no other abdominal surgeries.  Reports last bowel movement was yesterday and was normal for her.  She has been taking her oxycodone (prescribed for another issue) as well as Tylenol with very minimal relief.  She is also currently on Augmentin started 1/24 for otitis media.  She also reports very dark urine but denies any odor, dysuria.  States she feels dehydrated is drinking normally.  No recent travel.  No other concerns at this time.   Abdominal Pain   Past Medical History:  Diagnosis Date   Abnormal uterine bleeding    Acid reflux    Alpha thalassemia trait    Amenorrhea    Anemia    no current med.   Asthma    Back pain    Cesarean delivery delivered 10/11/2019   10/09/2019 - primary CS for failed IOL   Chest pain    resolved   Complication of anesthesia    states had to keep giving her anesthesia during EGD   Constipation    Depression    "I'm good"   Fibromyalgia    Finger mass, left 03/2017   middle finger   Gestational hypertension 09/23/2019   Guidelines for Antenatal Testing and Sonography  (with updated ICD-10 codes)  Updated  09-21-2019 with Dr. Tama High  INDICATION U/S 2 X week NST/AFI  or full BPP wkly DELIVERY Diabetes   A1 - good control - O24.410    A2 - good control - O24.419      A2  - poor control or poor compliance - O24.419, E11.65   (Macrosomia or  polyhydramnios) **E11.65 is extra code for poor control**    A2/B - O24.   Headache    Hepatitis 07/18/2022   Hepatitis B   History of kidney stones    Hypertension    Infection    UTI   Irritable bowel syndrome (IBS)    Joint pain    Morbid obesity with body mass index (BMI) of 45.0 to 49.9 in adult [facility])    Obesity during pregnancy, antepartum 04/06/2019   Body mass index is 53.71 kg/m.  Recommendations '[x]'$  Aspirin 81 mg daily after 12 weeks; discontinue after 36 weeks '[ ]'$  Nutrition consult '[ ]'$  Weight gain 11-20 lbs for singleton and 25-35 lbs for twin pregnancy (IOM guidelines) Higher class of obesity patients recommended to gain closer to lower limit  Weight loss is associated with adverse outcomes '[ ]'$  Baseline and surveillance labs (pulled in fr   Plantar fasciitis, bilateral    resolved   Polycystic ovary syndrome    Pre-diabetes    no meds, diet controlled, diet not check blood sugar   Pregnancy induced hypertension    Shortness of breath    albuterol inhaler   Sleep apnea    no CPAP  use   Supervision of normal first pregnancy 04/06/2019   [name]: [ x]Initial [ x]12 '[ ]'$ 20 '[ ]'$ 28 '[ ]'$ 32 '[ ]'$ 36 '[ ]'$ 38 '[ ]'$ 39 '[ ]'$ 40 Nursing Staff Provider Office Location [facility]  Dating   LMP Language  English  Anatomy US  Nml Flu Vaccine  Declined 07/28/19 Genetic Screen  NIPS: low risks   AFP:   negative  TDaP vaccine   08/25/19 Hgb A1C or  GTT Early A1C 5.8 Third trimester: GDM insulin Rhogam  NA   LAB RESULTS  Feeding Plan Breast Blood Type   Vertigo 2017    Patient Active Problem List   Diagnosis Date Noted   IUD check up 05/15/2021   Encounter for insertion of mirena IUD 04/01/2021   Levator spasm 11/21/2020   Galactorrhea 11/19/2020   Hx of migraine headaches 04/09/2020   Contraceptive management 11/22/2019   Positive RPR test 10/09/2019   HTN (hypertension) 09/23/2019   Migraine headache 07/12/2019   Alpha thalassemia trait    Vitamin D deficiency 09/16/2017   Prediabetes  09/16/2017   Depression 09/16/2017   Class 3 severe obesity with serious comorbidity and body mass index (BMI) of 50.0 to 59.9 in adult ([place]) 09/16/2017   PCOS (polycystic ovarian syndrome) 08/13/2017   Mass 05/06/2017   Vertigo 10/06/2016   Microcytic anemia 09/26/2016   Obesity hypoventilation syndrome ([place]) 08/28/2016   Benign paroxysmal positional vertigo due to bilateral vestibular disorder 08/28/2016   OSA (obstructive sleep apnea) 08/28/2016   Hypersomnia with sleep apnea 08/28/2016   Female hirsutism 08/28/2016   GERD (gastroesophageal reflux disease) 02/24/2015   Constipation 10/02/2014    Past Surgical History:  Procedure Laterality Date   CESAREAN SECTION N/A 10/09/2019   Procedure: CESAREAN SECTION;  Surgeon: Aletha Halim, MD;  Location: [facility];  Service: Obstetrics;  Laterality: N/A;   COLONOSCOPY WITH PROPOFOL  10/07/2016   DILATION AND CURETTAGE OF UTERUS     ESOPHAGOGASTRODUODENOSCOPY  12/21/2015   EXCISION MASS UPPER EXTREMETIES Left 04/21/2017   Procedure: EXCISION MASS LEFT MIDDLE FINGER;  Surgeon: Daryll Brod, MD;  Location: [facility];  Service: Orthopedics;  Laterality: Left;   gastric sleeve  05/09/2022   IR FL GUIDED LOC OF NEEDLE/CATH TIP FOR SPINAL INJECTION RT  03/13/2020   KNEE ARTHROSCOPY WITH LATERAL MENISECTOMY Left 10/31/2022   Procedure: KNEE ARTHROSCOPY WITH PARTIAL LATERAL MENISECTOMY;  Surgeon: Nicholes Stairs, MD;  Location: [facility];  Service: Orthopedics;  Laterality: Left;  75   KNEE ARTHROSCOPY WITH LATERAL RELEASE Left 10/31/2022   Procedure: KNEE ARTHROSCOPY WITH LATERAL RELEASE AND LIMITED SYNOVECTOMY;  Surgeon: Nicholes Stairs, MD;  Location: [facility];  Service: Orthopedics;  Laterality: Left;  75   PHOTOCOAGULATION WITH LASER Left 12/13/2020   Procedure: RETINOPLEXY LEFT EYE;  Surgeon: Sherlynn Stalls, MD;  Location: [facility];  Service: Ophthalmology;  Laterality: Left;   SCLERAL BUCKLE Right 12/13/2020    Procedure: SCLERAL BUCKLE WITH CRYO;  Surgeon: Sherlynn Stalls, MD;  Location: [facility];  Service: Ophthalmology;  Laterality: Right;    OB History     Gravida  1   Para  1   Term  1   Preterm  0   AB  0   Living  1      SAB  0   IAB  0   Ectopic  0   Multiple  0   Live Births  1            Home Medications  Prior to Admission medications   Medication Sig Start Date End Date Taking? Authorizing Provider  albuterol (VENTOLIN HFA) 108 (90 Base) MCG/ACT inhaler Inhale 2 puffs into the lungs every 6 (six) hours as needed for wheezing or shortness of breath. 08/19/22  Yes Martinique, Betty G, MD  ALPRAZolam Duanne Moron) 0.5 MG tablet Take 0.5 mg by mouth daily as needed for anxiety.   Yes [provider]  amoxicillin-clavulanate (AUGMENTIN) 875-125 MG tablet Take 1 tablet by mouth 2 (two) times daily for 10 days. 11/12/22 11/22/22 Yes Nafziger, Tommi Rumps, NP  APPLE CIDER VINEGAR PO Take 1 tablet by mouth daily.   Yes [provider]  Blood Pressure Monitoring (BLOOD PRESSURE MON/AUTO/WRIST) DEVI Use to monitor blood pressure 09/24/21  Yes Nafziger, Tommi Rumps, NP  budesonide-formoterol (SYMBICORT) 160-4.5 MCG/ACT inhaler Inhale 2 puffs into the lungs 2 (two) times daily. 10/03/22  Yes Maryjane Hurter, MD  CALCIUM PO Take 1 tablet by mouth daily.   Yes [provider]  COLLAGEN PO Take 1 tablet by mouth daily.   Yes [provider]  fluconazole (DIFLUCAN) 150 MG tablet Take 1 tablet (150 mg total) by mouth daily. 11/12/22  Yes Nafziger, Tommi Rumps, NP  fluticasone (FLONASE) 50 MCG/ACT nasal spray Place 2 sprays into both nostrils daily. 11/12/22  Yes Nafziger, Tommi Rumps, NP  hydrocortisone (ANUSOL-HC) 25 MG suppository Place 1 suppository (25 mg total) rectally 2 (two) times daily. 11/12/22  Yes Nafziger, Tommi Rumps, NP  levonorgestrel (MIRENA, 52 MG,) 20 MCG/DAY IUD 1 each by Intrauterine route once. 04/02/22  Yes [provider]  loratadine (CLARITIN) 10 MG tablet Take  10 mg by mouth daily as needed for allergies.    Yes [provider]  Multiple Vitamins-Minerals (BARIATRIC MULTIVITAMINS/IRON PO) Take 1 tablet by mouth daily.   Yes [provider]  Multiple Vitamins-Minerals (HAIR SKIN NAILS PO) Take 1 tablet by mouth daily.   Yes [provider]  naloxone (NARCAN) nasal spray 4 mg/0.1 mL Place 0.4 mg into the nose once. 09/09/22  Yes [provider]  ondansetron (ZOFRAN) 4 MG tablet Take 1 tablet (4 mg total) by mouth every 8 (eight) hours as needed. 10/31/22  Yes Nicholes Stairs, MD  ondansetron (ZOFRAN-ODT) 8 MG disintegrating tablet Take 8 mg by mouth every 8 (eight) hours as needed for vomiting or nausea.   Yes [provider]  oxyCODONE-acetaminophen (PERCOCET/ROXICET) 5-325 MG tablet Take 1 tablet by mouth 6 (six) times daily. 10/31/22  Yes Nicholes Stairs, MD  pantoprazole (PROTONIX) 40 MG tablet Take 1 tablet (40 mg total) by mouth daily. 06/11/21  Yes Maryjane Hurter, MD  pregabalin (LYRICA) 75 MG capsule Take 75 mg by mouth at bedtime. 10/07/22  Yes [provider]  tiZANidine (ZANAFLEX) 4 MG tablet Take 4 mg by mouth 3 (three) times daily as needed for muscle spasms. 09/09/22  Yes [provider]  topiramate (TOPAMAX) 50 MG tablet Take 50 mg by mouth 2 (two) times daily. 03/21/21  Yes [provider]  ferrous sulfate 325 (65 FE) MG tablet Take 1 tablet (325 mg total) by mouth every other day. Patient not taking: Reported on 10/21/2022 02/23/20 10/15/22  Dorothyann Peng, NP  magic mouthwash w/lidocaine SOLN Take 5 mLs by mouth 3 (three) times daily as needed (gargle and spit). Patient taking differently: Take 5 mLs by mouth 3 (three) times daily as needed for mouth pain. 07/29/22   Nafziger, Tommi Rumps, NP  tamsulosin (FLOMAX) 0.4 MG CAPS capsule Take 1 capsule (0.4 mg  total) by mouth daily. 09/10/22   Nafziger, Tommi Rumps, NP    Family History Family History  Problem Relation Age of  Onset   Diabetes Maternal Aunt    Hypertension Maternal Uncle    Depression Maternal Grandfather    Diabetes Maternal Grandfather    Hypertension Maternal Grandfather    Arthritis Mother    High blood pressure Mother    Depression Mother    Sleep apnea Mother    Obesity Mother    Diabetes Mother    Hypertension Mother    Kidney failure Mother    Sleep apnea Father    Obesity Father    Healthy Daughter     Social History Social History   Tobacco Use   Smoking status: Never   Smokeless tobacco: Never  Vaping Use   Vaping Use: Never used  Substance Use Topics   Alcohol use: Not Currently    Comment: social    Drug use: No     Allergies   Bee venom, Hydrocodone-acetaminophen, Peach flavor, Pollen extract, Remdesivir, Latex, Cortisone, Cortizone-10 [hydrocortisone], and Toradol [ketorolac tromethamine]   Review of Systems Review of Systems  Gastrointestinal:  Positive for abdominal pain.  Genitourinary:        Dark urine     Physical Exam Triage Vital Signs ED Triage Vitals  Enc Vitals Group     BP 11/15/22 0919 107/71     Pulse Rate 11/15/22 0919 86     Resp 11/15/22 0919 18     Temp 11/15/22 0919 98.1 F (36.7 C)     Temp Source 11/15/22 0919 Oral     SpO2 11/15/22 0919 94 %     Weight 11/15/22 0923 (!) 395 lb (179.2 kg)     Height 11/15/22 0923 6' 2.5" (1.892 m)     Head Circumference --      Peak Flow --      Pain Score 11/15/22 0923 9     Pain Loc --      Pain Edu? --      Excl. in [place]? --    No data found.  Updated Vital Signs BP 107/71 (BP Location: Left Arm)   Pulse 86   Temp 98.1 F (36.7 C) (Oral)   Resp 18   Ht 6' 2.5" (1.892 m)   Wt (!) 395 lb (179.2 kg)   LMP 10/20/2022 (Exact Date)   SpO2 94%   BMI 50.04 kg/m   Visual Acuity Right Eye Distance:   Left Eye Distance:   Bilateral Distance:    Right Eye Near:   Left Eye Near:    Bilateral Near:     Physical Exam Vitals and nursing note reviewed.  Constitutional:       General: She is not in acute distress.    Appearance: She is well-developed. She is obese. She is not ill-appearing.  HENT:     Head: Normocephalic and atraumatic.  Eyes:     Pupils: Pupils are equal, round, and reactive to light.  Cardiovascular:     Rate and Rhythm: Normal rate.  Pulmonary:     Effort: Pulmonary effort is normal.  Abdominal:     General: Bowel sounds are normal.     Palpations: Abdomen is soft.     Tenderness: There is abdominal tenderness in the right upper quadrant and left upper quadrant. There is no right CVA tenderness or left CVA tenderness. Negative signs include Rovsing's sign and McBurney's sign.     Comments: Exam somewhat limited  by body habitus  Skin:    General: Skin is warm and dry.  Neurological:     General: No focal deficit present.     Mental Status: She is alert and oriented to person, place, and time.  Psychiatric:        Mood and Affect: Mood normal.        Behavior: Behavior normal.      UC Treatments / Results  Labs (all labs ordered are listed, but only abnormal results are displayed) Labs Reviewed  POCT URINALYSIS DIP (MANUAL ENTRY) - Abnormal; Notable for the following components:      Result Value   Color, UA brown (*)    Clarity, UA hazy (*)    Bilirubin, UA small (*)    Ketones, POC UA trace (5) (*)    Spec Grav, UA >=1.030 (*)    Protein Ur, POC =30 (*)    Urobilinogen, UA 2.0 (*)    All other components within normal limits  POCT URINE PREGNANCY    EKG   Radiology No results found.  Procedures Procedures (including critical care time)  Medications Ordered in UC Medications - No data to display  Initial Impression / Assessment and Plan / UC Course  I have reviewed the triage vital signs and the nursing notes.  Pertinent labs & imaging results that were available during my care of the patient were reviewed by me and considered in my medical decision making (see chart for details).     Reviewed exam and  symptoms with patient.  I discussed limitations and abilities of urgent care Given her persistent abdominal pain, exam, and urine results, advise she go to the emergency room for further evaluation and treatment.  Patient is in agreement with plan will go POV to the emergency room.  She was instructed to pull over and call 911 for any worsening symptoms that occur in transit and she verbalized understanding. Final Clinical Impressions(s) / UC Diagnoses   Final diagnoses:  Left lower quadrant abdominal pain     Discharge Instructions      Please go to the emergency room for further evaluation of your abdominal pain     ED Prescriptions   None    PDMP not reviewed this encounter.   Melynda Ripple, NP 11/15/22 1004

## 2022-11-15 NOTE — ED Triage Notes (Signed)
Patient arrives with complaints of abdominal pain going into groin for 4 days. Patient was seen at Urgent Care, but was sent here further evaluation due to an abnormal urine findings.  Rates abdominal pain a 10/10.

## 2022-11-15 NOTE — Discharge Instructions (Addendum)
Please go to the emergency room for further evaluation of your abdominal pain

## 2022-11-15 NOTE — Discharge Instructions (Addendum)
You were seen in the ER for evaluation of your abdominal pain. Your labs show some signs of dehydration, otherwise are unremarkable. Your imaging shows signs of mesenteric panniculitis which is inflammation of the mesentery in your bowel. The etiology of this is unknown. They also see some signs of enlarged lymph nodes which they would like for you to follow up with in 3 months. Please keep your scheduled appointments with your bariatric providers and mention this to them. Additionally, please make sure you are taking your antibiotics for your otitis media.  Additionally, was seen by GI bacterial vaginosis, I am prescribing you a new medication for this.  For your mesenteric any cheilitis, I am sending you home with prednisone to take to help with inflammation.  I would like for you to take your temperatures regularly.  He can take Tylenol 1000 mg to help with pain and fever as well.  If you have any new changes to pain, fevers, nausea, vomiting, pain with urination, blood in your urine, diarrhea, constipation, back pain, body aches, joint swelling, rashes, please return to the nearest emergency department for evaluation immediately.  Contact a doctor if: Your belly pain changes or gets worse. You are not hungry, or you lose weight without trying. You are having trouble pooping (constipated) or have watery poop (diarrhea) for more than 2-3 days. You have pain when you pee or poop. Your belly pain wakes you up at night. Your pain gets worse with meals, after eating, or with certain foods. You are vomiting and cannot keep anything down. You have a fever. You have blood in your pee. Get help right away if: Your pain does not go away as soon as your doctor says it should. You cannot stop vomiting. Your pain is only in areas of your belly, such as the right side or the left lower part of the belly. You have bloody or black poop, or poop that looks like tar. You have very bad pain, cramping, or bloating  in your belly. You have signs of not having enough fluid or water in your body (dehydration), such as: Dark pee, very little pee, or no pee. Cracked lips. Dry mouth. Sunken eyes. Sleepiness. Weakness. You have trouble breathing or chest pain.

## 2022-11-15 NOTE — ED Provider Notes (Signed)
[facility] Provider Note   CSN: [id number] Arrival date & time: 11/15/22  1018     History Chief Complaint  Patient presents with   Abdominal Pain    Maureen Lewis is a 27 y.o. female with history of GERD, obesity hypoventilation syndrome, anemia, PCOS presents the emergency room today for evaluation of diffuse upper abdominal pain for the past 4 days.  She reports it has been off and on for the past month but mainly waxing and waning over the past 4 to 5 days.  She feels like it radiates down into her suprapubic region however is not tender in that area.  She had some nausea and had 1 episode of vomiting 2 days prior that was nonbilious and nonbloody.  She has not had any diarrhea or constipation.  Normal BM this morning.  No melena.  Reports that she has had some hematochezia over the past 2 weeks but was diagnosed with hemorrhoids by her primary care doctor and has not picked up the rectal suppositories yet.  She denies any dysuria or hematuria.  Denies any vaginal discharge.  Reports that she thinks that her cycle is starting.  Reports a fever over 5 days ago of 100.1 but has not had any fever since.  Her surgeries are pertinent for gastric sleeve of July and 2023.  Had a knee surgery earlier this month.  C-section in 2020.  She was sent over from urgent care for evaluation of her abnormal urine.   Abdominal Pain Associated symptoms: fever (100.66F max), nausea and vomiting   Associated symptoms: no chest pain, no chills, no constipation, no diarrhea, no dysuria, no hematuria, no shortness of breath, no vaginal bleeding and no vaginal discharge        Home Medications Prior to Admission medications   Medication Sig Start Date End Date Taking? Authorizing Provider  albuterol (VENTOLIN HFA) 108 (90 Base) MCG/ACT inhaler Inhale 2 puffs into the lungs every 6 (six) hours as needed for wheezing or shortness of breath. 08/19/22    Martin[place], Betty G, MD  ALPRAZolam Duanne Moron) 0.5 MG tablet Take 0.5 mg by mouth daily as needed for anxiety.    [provider]  amoxicillin-clavulanate (AUGMENTIN) 875-125 MG tablet Take 1 tablet by mouth 2 (two) times daily for 10 days. 11/12/22 11/22/22  Nafziger, Tommi Rumps, NP  APPLE CIDER VINEGAR PO Take 1 tablet by mouth daily.    [provider]  Blood Pressure Monitoring (BLOOD PRESSURE MON/AUTO/WRIST) DEVI Use to monitor blood pressure 09/24/21   Nafziger, Tommi Rumps, NP  budesonide-formoterol (SYMBICORT) 160-4.5 MCG/ACT inhaler Inhale 2 puffs into the lungs 2 (two) times daily. 10/03/22   Maryjane Hurter, MD  CALCIUM PO Take 1 tablet by mouth daily.    [provider]  COLLAGEN PO Take 1 tablet by mouth daily.    [provider]  ferrous sulfate 325 (65 FE) MG tablet Take 1 tablet (325 mg total) by mouth every other day. Patient not taking: Reported on 10/21/2022 02/23/20 10/15/22  Dorothyann Peng, NP  fluconazole (DIFLUCAN) 150 MG tablet Take 1 tablet (150 mg total) by mouth daily. 11/12/22   Nafziger, Tommi Rumps, NP  fluticasone (FLONASE) 50 MCG/ACT nasal spray Place 2 sprays into both nostrils daily. 11/12/22   Nafziger, Tommi Rumps, NP  hydrocortisone (ANUSOL-HC) 25 MG suppository Place 1 suppository (25 mg total) rectally 2 (two) times daily. 11/12/22   Nafziger, Tommi Rumps, NP  levonorgestrel (MIRENA, 52 MG,) 20 MCG/DAY IUD 1 each  by Intrauterine route once. 04/02/22   [provider]  loratadine (CLARITIN) 10 MG tablet Take 10 mg by mouth daily as needed for allergies.     [provider]  magic mouthwash w/lidocaine SOLN Take 5 mLs by mouth 3 (three) times daily as needed (gargle and spit). Patient taking differently: Take 5 mLs by mouth 3 (three) times daily as needed for mouth pain. 07/29/22   Nafziger, Tommi Rumps, NP  Multiple Vitamins-Minerals (BARIATRIC MULTIVITAMINS/IRON PO) Take 1 tablet by mouth daily.    [provider]  Multiple Vitamins-Minerals (HAIR SKIN  NAILS PO) Take 1 tablet by mouth daily.    [provider]  naloxone [facility]) nasal spray 4 mg/0.1 mL Place 0.4 mg into the nose once. 09/09/22   [provider]  ondansetron (ZOFRAN) 4 MG tablet Take 1 tablet (4 mg total) by mouth every 8 (eight) hours as needed. 10/31/22   Nicholes Stairs, MD  ondansetron (ZOFRAN-ODT) 8 MG disintegrating tablet Take 8 mg by mouth every 8 (eight) hours as needed for vomiting or nausea.    [provider]  oxyCODONE-acetaminophen (PERCOCET/ROXICET) 5-325 MG tablet Take 1 tablet by mouth 6 (six) times daily. 10/31/22   Nicholes Stairs, MD  pantoprazole (PROTONIX) 40 MG tablet Take 1 tablet (40 mg total) by mouth daily. 06/11/21   Maryjane Hurter, MD  pregabalin (LYRICA) 75 MG capsule Take 75 mg by mouth at bedtime. 10/07/22   [provider]  tamsulosin (FLOMAX) 0.4 MG CAPS capsule Take 1 capsule (0.4 mg total) by mouth daily. 09/10/22   Nafziger, Tommi Rumps, NP  tiZANidine (ZANAFLEX) 4 MG tablet Take 4 mg by mouth 3 (three) times daily as needed for muscle spasms. 09/09/22   [provider]  topiramate (TOPAMAX) 50 MG tablet Take 50 mg by mouth 2 (two) times daily. 03/21/21   [provider]      Allergies    Bee venom, Hydrocodone-acetaminophen, Peach flavor, Pollen extract, Remdesivir, Latex, Cortisone, Cortizone-10 [hydrocortisone], and Toradol [ketorolac tromethamine]    Review of Systems   Review of Systems  Constitutional:  Positive for fever (100.42F max). Negative for chills.  Respiratory:  Negative for shortness of breath.   Cardiovascular:  Negative for chest pain.  Gastrointestinal:  Positive for abdominal pain, anal bleeding, nausea and vomiting. Negative for constipation and diarrhea.  Genitourinary:  Negative for dysuria, hematuria, urgency, vaginal bleeding and vaginal discharge.  Musculoskeletal:  Negative for back pain.    Physical Exam Updated Vital Signs BP (!) 146/87   Pulse 84    Temp 100.3 F (37.9 C) (Oral)   Resp 16   Ht '6\' 2"'$  (1.88 m)   Wt (!) 179.2 kg   LMP 10/20/2022 (Exact Date)   SpO2 100%   BMI 50.72 kg/m  Physical Exam Exam conducted with a chaperone present Velna Hatchet, RN).  Constitutional:      General: She is not in acute distress.    Appearance: She is not ill-appearing or toxic-appearing.     Comments: In no acute distress, remote in hand watching television  HENT:     Right Ear: Ear canal and external ear normal. Tympanic membrane is erythematous.     Ears:     Comments: Mildly erythematous right TM. No abnormality seen on external ear or canal. No mastoid tenderness or swelling.     Mouth/Throat:     Mouth: Mucous membranes are moist.  Cardiovascular:     Rate and Rhythm: Normal rate.  Pulmonary:  Effort: Pulmonary effort is normal. No respiratory distress.     Breath sounds: Normal breath sounds.  Abdominal:     General: Bowel sounds are normal.     Palpations: Abdomen is soft.     Tenderness: There is abdominal tenderness in the right upper quadrant, epigastric area and left upper quadrant. There is no right CVA tenderness, left CVA tenderness, guarding or rebound.     Comments: Abdominal exam limited secondary to body habitus.  She has diffuse upper abdominal tenderness but mainly in the left upper quadrant.  She has no lower abdominal tenderness to palpate.  Abdomen is soft.  Normal active bowel sounds.  No overlying skin changes noted.  No CVA tenderness bilaterally.  Genitourinary:    Comments: Small amount of blood in the vaginal canal. No CMT tenderness. IUD strings visible.  Musculoskeletal:     Cervical back: Normal range of motion.  Neurological:     Mental Status: She is alert.     ED Results / Procedures / Treatments   Labs (all labs ordered are listed, but only abnormal results are displayed) Labs Reviewed  LIPASE, BLOOD - Abnormal; Notable for the following components:      Result Value   Lipase <10 (*)    All  other components within normal limits  CBC - Abnormal; Notable for the following components:   RBC 5.13 (*)    MCV 77.4 (*)    MCH 24.8 (*)    All other components within normal limits  URINALYSIS, ROUTINE W REFLEX MICROSCOPIC - Abnormal; Notable for the following components:   Specific Gravity, Urine 1.034 (*)    Hgb urine dipstick LARGE (*)    Protein, ur 30 (*)    Bacteria, UA RARE (*)    All other components within normal limits  RESP PANEL BY RT-PCR (RSV, FLU A&B, COVID)  RVPGX2  WET PREP, GENITAL  COMPREHENSIVE METABOLIC PANEL  PREGNANCY, URINE  LACTIC ACID, PLASMA  LACTIC ACID, PLASMA    EKG None  Radiology CT ABDOMEN PELVIS W CONTRAST  Result Date: 11/15/2022 CLINICAL DATA:  Abdominal pain and groin pain EXAM: CT ABDOMEN AND PELVIS WITH CONTRAST TECHNIQUE: Multidetector CT imaging of the abdomen and pelvis was performed using the standard protocol following bolus administration of intravenous contrast. RADIATION DOSE REDUCTION: This exam was performed according to the departmental dose-optimization program which includes automated exposure control, adjustment of the mA and/or kV according to patient size and/or use of iterative reconstruction technique. CONTRAST:  143m OMNIPAQUE IOHEXOL 300 MG/ML  SOLN COMPARISON:  05/10/2020 FINDINGS: Lower chest: Included lung bases are clear.  Heart size is normal. Hepatobiliary: No focal liver abnormality is seen. No gallstones, gallbladder wall thickening, or biliary dilatation. Pancreas: Unremarkable. No pancreatic ductal dilatation or surrounding inflammatory changes. Spleen: Normal in size without focal abnormality. Adrenals/Urinary Tract: Unremarkable adrenal glands. Kidneys enhance symmetrically without focal lesion, stone, or hydronephrosis. Ureters are nondilated. Urinary bladder is decompressed, limiting its evaluation. Stomach/Bowel: Interval postsurgical changes to the stomach of sleeve gastrectomy without apparent complication. No  dilated loops of bowel. No focal bowel wall thickening or inflammatory changes. Normal appendix in the right lower quadrant. Vascular/Lymphatic: No significant vascular abnormality. New area of hazy increased density in the central mesentery with several prominent, although not pathologically enlarged lymph nodes. Mildly enlarged 1.3 cm upper abdominal pancreatic station lymph node (series 2, image 21). Enlarged pancreaticoduodenal node measuring 1.7 cm (series 2, image 29). There are few additional mildly prominent upper abdominal lymph nodes. These  nodes have increased in size since 2021. No enlarged pelvic or inguinal lymph nodes. Reproductive: Anteverted uterus with IUD.  No adnexal abnormality. Other: No free fluid. No abdominopelvic fluid collection. No pneumoperitoneum. No abdominal wall hernia. Musculoskeletal: No acute or significant osseous findings. IMPRESSION: 1. New area of hazy increased density in the central mesentery with several prominent, although not pathologically enlarged lymph nodes. Findings are nonspecific but can be seen in the setting of mesenteric panniculitis. 2. Mildly enlarged upper abdominal lymph nodes, largest of which is a 1.7 cm pancreaticoduodenal node. Nodes are favored to be reactive, however given size, a follow-up CT in 3 months is recommended to ensure stability. 3. Otherwise, no acute abdominopelvic findings. Specifically, no urolithiasis. Electronically Signed   By: Davina Poke D.O.   On: 11/15/2022 13:00    Procedures Procedures   Medications Ordered in ED Medications  iohexol (OMNIPAQUE) 300 MG/ML solution 100 mL (100 mLs Intravenous Contrast Given 11/15/22 1243)  acetaminophen (TYLENOL) tablet 650 mg (650 mg Oral Given 11/15/22 1622)    ED Course/ Medical Decision Making/ A&P Clinical Course as of 11/15/22 1754  Sat Nov 15, 2022  1652 Consulted Grandville Silos, who is going to contact one of the bariatric surgeons to look over the scans. Will call back.  [RR]     Clinical Course User Index [RR] Sherrell Puller, PA-C                           Medical Decision Making Amount and/or Complexity of Data Reviewed Labs: ordered. Radiology: ordered.  Risk OTC drugs. Prescription drug management.   27 year old female presents the emergency room today for evaluation of diffuse abdominal pain.  Differential diagnosis includes was not limited to AAA, mesenteric ischemia, appendicitis, diverticulitis, DKA, gastroenteritis, nephrolithiasis, pancreatitis, constipation, UTI, bowel obstruction, biliary disease, IBD, PUD, hepatitis, ectopic pregnancy, ovarian torsion, PID.  Vital signs show blood pressure 146/87, normal pulse rate, satting well room air without increased work of breathing.  Patient initially afebrile but on repeat shows a temperature of 100.3.  Physical exam as noted above.  Will order labs.  Patient reports that she feels like this is her kidney stones however her abdomen is tender to palpation which is less likely from a kidney stone.  Because of this we will order a CT abdomen and pelvis with contrast to evaluate her stomach after her gastric sleeve surgery as well.  I independently reviewed and interpret the patient's labs.  Urinalysis shows concentrated urine does have some hemoglobin with greater than 50 red blood cells however patient is currently on her cycle.  Rare bacteria and 6-10 white blood cells with 0-5 squames.  There is some protein.  Not really consistent with any UTI as she does not have any urinary symptoms.  CT abdomen pelvis shows 1. New area of hazy increased density in the central mesentery with several prominent, although not pathologically enlarged lymph nodes. Findings are nonspecific but can be seen in the setting of mesenteric panniculitis. 2. Mildly enlarged upper abdominal lymph nodes, largest of which is a 1.7 cm pancreaticoduodenal node. Nodes are favored to be reactive, however given size, a follow-up CT in 3 months is  recommended to ensure stability. 3. Otherwise, no acute abdominopelvic findings. Specifically, no urolithiasis.  Consulted general surgery and spoke with Dr. Grandville Silos about the CT read.  He consulted Dr. Cherlyn Roberts who is a Ambulance person.  Per Dr. Grandville Silos, Dr. Cherlyn Roberts overlooked the CT image and does not  see anything of concern.  Given the patient cannot receive any NSAIDs given her gastric sleeve surgery, will proceed with prednisone.  The patient now has a low-grade fever of 100.5 after getting Tylenol.  She reports new information that she has otitis media but has not picked up her antibiotics for this a few days prior.  From further research on mesenteric panniculitis on up-to-date, it does show that you can have low fevers with this.  Patient's vital signs are stable, she is well-appearing and ambulatory around the room, eating and drinking with ease. General surgery /bariatric surgery has reviewed the images and do not see other acute pathology.  Her labs show some dehydration, otherwise are unremarkable. No peritoneal signs, although exam is limited 2/2 body habitus. I discussed with her strict return precautions and red flag symptoms and to come back if anything changes differently.  She is following up with her bariatric surgeon next week for an unrelated issue.  I asked that she mention this to him for further monitoring.  I also discussed that she needs to take the antibiotics that she was given for her otitis media.The patient verbalizes her understanding and agrees to the plan. The patient is stable and being discharged home in good condition.   I discussed this case with my attending physician who cosigned this note including patient's presenting symptoms, physical exam, and planned diagnostics and interventions. Attending physician stated agreement with plan or made changes to plan which were implemented.   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

## 2022-11-15 NOTE — ED Notes (Signed)
Pt requesting food/drink. EDPA aware. Pt given popsicle, cheese stick, and granola bar with ice water. No NVD, NAD. Pt repositioning herself without assistance, sitting up in bed watching tv.

## 2022-11-15 NOTE — ED Notes (Signed)
Patient is being discharged from the Urgent Care and sent to the Emergency Department via POV. Per Rosario Adie NP, patient is in need of higher level of care due to need for further evaluation. Patient is aware and verbalizes understanding of plan of care.  Vitals:   11/15/22 0919  BP: 107/71  Pulse: 86  Resp: 18  Temp: 98.1 F (36.7 C)  SpO2: 94%

## 2022-11-15 NOTE — ED Triage Notes (Signed)
Patient c/o LUQ pain that radiates down to lower abdomen and vaginal area x 4 days.  Pain is more of a pulling sensation.  No dysuria, feeling dehydrated.  Denies any hematuria.  Patient has been taken Tylenol and pain meds.  Nausea and vomiting, denies diarrhea.

## 2022-11-15 NOTE — ED Notes (Signed)
Pt ambulatory without assistance, NAD. Pt able to speak in complete sentences, engages in conversation. Pt sitting upright in bed watching tv. [place] her spouse.

## 2022-11-18 ENCOUNTER — Telehealth: Payer: Self-pay | Admitting: *Deleted

## 2022-11-18 NOTE — Patient Outreach (Signed)
  Care Coordination [facility] Note Transition Care Management Follow-up Telephone Call Date of discharge and from where: 11/15/22 from [facility] How have you been since you were released from the hospital? "I'm doing ok" Any questions or concerns? No  Items Reviewed: Did the pt receive and understand the discharge instructions provided? Yes  Medications obtained and verified?  Patient will pick up medications today Other? No  Any new allergies since your discharge? No  Dietary orders reviewed? Yes Do you have support at home? Yes   Home Care and Equipment/Supplies: Were home health services ordered? no If so, what is the name of the agency? N/A  Has the agency set up a time to come to the patient's home? not applicable Were any new equipment or medical supplies ordered?  No What is the name of the medical supply agency? N/A Were you able to get the supplies/equipment? not applicable Do you have any questions related to the use of the equipment or supplies? No  Functional Questionnaire: (I = Independent and D = Dependent) ADLs: I  Bathing/Dressing- I  Meal Prep- I  Eating- I  Maintaining continence- I  Transferring/Ambulation- I  Managing Meds- I  Follow up appointments reviewed:  PCP Hospital f/u appt confirmed?  N/A, ED visit  . [facility] f/u appt confirmed?  N/A, ED visit . Are transportation arrangements needed? No  If their condition worsens, is the pt aware to call PCP or go to the Emergency Dept.? Yes Was the patient provided with contact information for the PCP's office or ED? No Was to pt encouraged to call back with questions or concerns? Yes  SDOH assessments and interventions completed:   Yes  Lurena Joiner RN, [place] RN Care Coordinator

## 2022-11-19 DIAGNOSIS — M25562 Pain in left knee: Secondary | ICD-10-CM | POA: Diagnosis not present

## 2022-11-20 DIAGNOSIS — Z6841 Body Mass Index (BMI) 40.0 and over, adult: Secondary | ICD-10-CM | POA: Diagnosis not present

## 2022-11-20 DIAGNOSIS — R110 Nausea: Secondary | ICD-10-CM | POA: Diagnosis not present

## 2022-11-20 DIAGNOSIS — F439 Reaction to severe stress, unspecified: Secondary | ICD-10-CM | POA: Diagnosis not present

## 2022-11-20 DIAGNOSIS — E119 Type 2 diabetes mellitus without complications: Secondary | ICD-10-CM | POA: Diagnosis not present

## 2022-11-20 DIAGNOSIS — Z903 Acquired absence of stomach [part of]: Secondary | ICD-10-CM | POA: Diagnosis not present

## 2022-11-21 ENCOUNTER — Ambulatory Visit (INDEPENDENT_AMBULATORY_CARE_PROVIDER_SITE_OTHER): Payer: Medicaid Other | Admitting: Adult Health

## 2022-11-21 ENCOUNTER — Encounter: Payer: Self-pay | Admitting: Adult Health

## 2022-11-21 VITALS — BP 128/90 | HR 96 | Temp 98.1°F | Ht 74.0 in | Wt >= 6400 oz

## 2022-11-21 DIAGNOSIS — K654 Sclerosing mesenteritis: Secondary | ICD-10-CM

## 2022-11-21 DIAGNOSIS — M25562 Pain in left knee: Secondary | ICD-10-CM | POA: Diagnosis not present

## 2022-11-21 NOTE — Progress Notes (Signed)
Subjective:    Patient ID: Maureen Lewis, female    DOB: Dec 23, 1995, 27 y.o.   MRN: [id number]  HPI 27 year old female who  has a past medical history of Abnormal uterine bleeding, Acid reflux, Alpha thalassemia trait, Amenorrhea, Anemia, Asthma, Back pain, Cesarean delivery delivered (10/11/2019), Chest pain, Complication of anesthesia, Constipation, Depression, Fibromyalgia, Finger mass, left (03/2017), Gestational hypertension (09/23/2019), Headache, Hepatitis (07/18/2022), History of kidney stones, Hypertension, Infection, Irritable bowel syndrome (IBS), Joint pain, Morbid obesity with body mass index (BMI) of 45.0 to 49.9 in adult [facility]), Obesity during pregnancy, antepartum (04/06/2019), Plantar fasciitis, bilateral, Polycystic ovary syndrome, Pre-diabetes, Pregnancy induced hypertension, Shortness of breath, Sleep apnea, Supervision of normal first pregnancy (04/06/2019), and Vertigo (2017).  27 year old female who presents to the office after being seen in the emergency room next days ago for her diffuse upper abdominal pain for the 4 days prior.  Pain was radiating down into her suprapubic region however it is nontender in that area.  She did have some associated nausea and vomiting but did not have any diarrhea or constipation.  Had a CT scan done which showed  IMPRESSION: 1. New area of hazy increased density in the central mesentery with several prominent, although not pathologically enlarged lymph nodes. Findings are nonspecific but can be seen in the setting of mesenteric panniculitis. 2. Mildly enlarged upper abdominal lymph nodes, largest of which is a 1.7 cm pancreaticoduodenal node. Nodes are favored to be reactive, however given size, a follow-up CT in 3 months is recommended to ensure stability. 3. Otherwise, no acute abdominopelvic findings. Specifically, no urolithiasis.   She had a follow-up with her bariatric surgeon who placed her on metformin and  prednisone to help with the inflammation.  It is recommended that she follow-up with gastroenterology she needs a referral.  Has not started metformin or prednisone yet but will pick up the prescriptions today.  She continues to have pretty significant diffuse abdominal pain but no further nausea or vomiting.   Review of Systems See HPI   Past Medical History:  Diagnosis Date   Abnormal uterine bleeding    Acid reflux    Alpha thalassemia trait    Amenorrhea    Anemia    no current med.   Asthma    Back pain    Cesarean delivery delivered 10/11/2019   10/09/2019 - primary CS for failed IOL   Chest pain    resolved   Complication of anesthesia    states had to keep giving her anesthesia during EGD   Constipation    Depression    "I'm good"   Fibromyalgia    Finger mass, left 03/2017   middle finger   Gestational hypertension 09/23/2019   Guidelines for Antenatal Testing and Sonography  (with updated ICD-10 codes)  Updated  Sep 22, 2019 with Dr. Tama High  INDICATION U/S 2 X week NST/AFI  or full BPP wkly DELIVERY Diabetes   A1 - good control - O24.410    A2 - good control - O24.419      A2  - poor control or poor compliance - O24.419, E11.65   (Macrosomia or polyhydramnios) **E11.65 is extra code for poor control**    A2/B - O24.   Headache    Hepatitis 07/18/2022   Hepatitis B   History of kidney stones    Hypertension    Infection    UTI   Irritable bowel syndrome (IBS)    Joint pain    Morbid  obesity with body mass index (BMI) of 45.0 to 49.9 in adult [facility])    Obesity during pregnancy, antepartum 04/06/2019   Body mass index is 53.71 kg/m.  Recommendations '[x]'$  Aspirin 81 mg daily after 12 weeks; discontinue after 36 weeks '[ ]'$  Nutrition consult '[ ]'$  Weight gain 11-20 lbs for singleton and 25-35 lbs for twin pregnancy (IOM guidelines) Higher class of obesity patients recommended to gain closer to lower limit  Weight loss is associated with adverse outcomes '[ ]'$  Baseline and  surveillance labs (pulled in fr   Plantar fasciitis, bilateral    resolved   Polycystic ovary syndrome    Pre-diabetes    no meds, diet controlled, diet not check blood sugar   Pregnancy induced hypertension    Shortness of breath    albuterol inhaler   Sleep apnea    no CPAP use   Supervision of normal first pregnancy 04/06/2019   [name]: [ x]Initial [ x]12 '[ ]'$ 20 '[ ]'$ 28 '[ ]'$ 32 '[ ]'$ 36 '[ ]'$ 38 '[ ]'$ 39 '[ ]'$ 40 Nursing Staff Provider Office Location [facility]  Dating   LMP Language  English  Anatomy US  Nml Flu Vaccine  Declined 07/28/19 Genetic Screen  NIPS: low risks   AFP:   negative  TDaP vaccine   08/25/19 Hgb A1C or  GTT Early A1C 5.8 Third trimester: GDM insulin Rhogam  NA   LAB RESULTS  Feeding Plan Breast Blood Type   Vertigo 2017    Social History   Socioeconomic History   Marital status: Single    Spouse name: Not on file   Number of children: Not on file   Years of education: Not on file   Highest education level: Not on file  Occupational History   Occupation: Chemical engineer    Employer: [id number]  Tobacco Use   Smoking status: Never   Smokeless tobacco: Never  Vaping Use   Vaping Use: Never used  Substance and Sexual Activity   Alcohol use: Not Currently    Comment: social    Drug use: No   Sexual activity: Yes    Birth control/protection: I.U.D.  Other Topics Concern   Not on file  Social History Narrative   Right handed    Soda sometimes   Lives with mom and cousin, a grandmother in [place] also helps care for her.       She is in nursing school.    Social Determinants of Health   Financial Resource Strain: Not on file  Food Insecurity: Not on file  Transportation Needs: No Transportation Needs (11/18/2022)   PRAPARE - Hydrologist (Medical): No    Lack of Transportation (Non-Medical): No  Physical Activity: Not on file  Stress: Not on file  Social Connections: Not on file  Intimate Partner Violence: Not on file     Past Surgical History:  Procedure Laterality Date   CESAREAN SECTION N/A 10/09/2019   Procedure: CESAREAN SECTION;  Surgeon: Aletha Halim, MD;  [facility]: [facility];  Service: Obstetrics;  Laterality: N/A;   COLONOSCOPY WITH PROPOFOL  10/07/2016   DILATION AND CURETTAGE OF UTERUS     ESOPHAGOGASTRODUODENOSCOPY  12/21/2015   EXCISION MASS UPPER EXTREMETIES Left 04/21/2017   Procedure: EXCISION MASS LEFT MIDDLE FINGER;  Surgeon: Daryll Brod, MD;  Location: [facility];  Service: Orthopedics;  Laterality: Left;   gastric sleeve  05/09/2022   IR FL GUIDED LOC OF NEEDLE/CATH TIP FOR SPINAL INJECTION RT  03/13/2020  KNEE ARTHROSCOPY WITH LATERAL MENISECTOMY Left 10/31/2022   Procedure: KNEE ARTHROSCOPY WITH PARTIAL LATERAL MENISECTOMY;  Surgeon: Nicholes Stairs, MD;  Location: [facility];  Service: Orthopedics;  Laterality: Left;  75   KNEE ARTHROSCOPY WITH LATERAL RELEASE Left 10/31/2022   Procedure: KNEE ARTHROSCOPY WITH LATERAL RELEASE AND LIMITED SYNOVECTOMY;  Surgeon: Nicholes Stairs, MD;  [facility]: [facility];  Service: Orthopedics;  Laterality: Left;  77   PHOTOCOAGULATION WITH LASER Left 12/13/2020   Procedure: RETINOPLEXY LEFT EYE;  Surgeon: Sherlynn Stalls, MD;  Location: [place];  Service: Ophthalmology;  Laterality: Left;   SCLERAL BUCKLE Right 12/13/2020   Procedure: SCLERAL BUCKLE WITH CRYO;  Surgeon: Sherlynn Stalls, MD;  Location: [place];  Service: Ophthalmology;  Laterality: Right;    Family History  Problem Relation Age of Onset   Diabetes Maternal Aunt    Hypertension Maternal Uncle    Depression Maternal Grandfather    Diabetes Maternal Grandfather    Hypertension Maternal Grandfather    Arthritis Mother    High blood pressure Mother    Depression Mother    Sleep apnea Mother    Obesity Mother    Diabetes Mother    Hypertension Mother    Kidney failure Mother    Sleep apnea Father    Obesity Father    Healthy Daughter     Allergies   Allergen Reactions   Bee Venom Hives, Shortness Of Breath and Swelling   Hydrocodone-Acetaminophen Anaphylaxis    No problem when she takes Tylenol   Psychologist, counselling, Shortness Of Breath and Other (See Comments)    SWELLING OF MOUTH   Pollen Extract Hives, Shortness Of Breath and Swelling   Remdesivir Swelling    Angioedema 8/25   Latex Rash   Cortisone Itching and Swelling   Cortizone-10 [Hydrocortisone]     Swelling and itching   Toradol [Ketorolac Tromethamine] Swelling    Current Outpatient Medications on File Prior to Visit  Medication Sig Dispense Refill   albuterol (VENTOLIN HFA) 108 (90 Base) MCG/ACT inhaler Inhale 2 puffs into the lungs every 6 (six) hours as needed for wheezing or shortness of breath. 8 g 0   ALPRAZolam (XANAX) 0.5 MG tablet Take 0.5 mg by mouth daily as needed for anxiety.     amoxicillin-clavulanate (AUGMENTIN) 875-125 MG tablet Take 1 tablet by mouth 2 (two) times daily for 10 days. 20 tablet 0   APPLE CIDER VINEGAR PO Take 1 tablet by mouth daily.     Blood Pressure Monitoring (BLOOD PRESSURE MON/AUTO/WRIST) DEVI Use to monitor blood pressure 1 each 0   budesonide-formoterol (SYMBICORT) 160-4.5 MCG/ACT inhaler Inhale 2 puffs into the lungs 2 (two) times daily. 1 each 6   CALCIUM PO Take 1 tablet by mouth daily.     COLLAGEN PO Take 1 tablet by mouth daily.     fluconazole (DIFLUCAN) 150 MG tablet Take 1 tablet (150 mg total) by mouth daily. 1 tablet 0   fluticasone (FLONASE) 50 MCG/ACT nasal spray Place 2 sprays into both nostrils daily. 16 g 6   hydrocortisone (ANUSOL-HC) 25 MG suppository Place 1 suppository (25 mg total) rectally 2 (two) times daily. 12 suppository 0   levonorgestrel (MIRENA, 52 MG,) 20 MCG/DAY IUD 1 each by Intrauterine route once.     loratadine (CLARITIN) 10 MG tablet Take 10 mg by mouth daily as needed for allergies.      magic mouthwash w/lidocaine SOLN Take 5 mLs by mouth 3 (three) times daily as needed (gargle  and spit).  (Patient taking differently: Take 5 mLs by mouth 3 (three) times daily as needed for mouth pain.) 180 mL 0   metroNIDAZOLE (FLAGYL) 500 MG tablet Take 1 tablet (500 mg total) by mouth 2 (two) times daily. 14 tablet 0   Multiple Vitamins-Minerals (BARIATRIC MULTIVITAMINS/IRON PO) Take 1 tablet by mouth daily.     Multiple Vitamins-Minerals (HAIR SKIN NAILS PO) Take 1 tablet by mouth daily.     naloxone (NARCAN) nasal spray 4 mg/0.1 mL Place 0.4 mg into the nose once.     ondansetron (ZOFRAN) 4 MG tablet Take 1 tablet (4 mg total) by mouth every 8 (eight) hours as needed. 20 tablet 0   ondansetron (ZOFRAN-ODT) 8 MG disintegrating tablet Take 8 mg by mouth every 8 (eight) hours as needed for vomiting or nausea.     oxyCODONE-acetaminophen (PERCOCET/ROXICET) 5-325 MG tablet Take 1 tablet by mouth 6 (six) times daily. 30 tablet 0   pantoprazole (PROTONIX) 40 MG tablet Take 1 tablet (40 mg total) by mouth daily. 90 tablet 0   predniSONE (DELTASONE) 20 MG tablet Take 2 tablets (40 mg total) by mouth daily. 10 tablet 0   pregabalin (LYRICA) 75 MG capsule Take 75 mg by mouth at bedtime.     tamsulosin (FLOMAX) 0.4 MG CAPS capsule Take 1 capsule (0.4 mg total) by mouth daily. 30 capsule 3   tiZANidine (ZANAFLEX) 4 MG tablet Take 4 mg by mouth 3 (three) times daily as needed for muscle spasms.     topiramate (TOPAMAX) 50 MG tablet Take 50 mg by mouth 2 (two) times daily.     ferrous sulfate 325 (65 FE) MG tablet Take 1 tablet (325 mg total) by mouth every other day. (Patient not taking: Reported on 10/21/2022) 45 tablet 1   No current facility-administered medications on file prior to visit.    BP (!) 128/90   Pulse 96   Temp 98.1 F (36.7 C) (Oral)   Ht '6\' 2"'$  (1.88 m)   Wt (!) 401 lb (181.9 kg)   LMP 10/20/2022 (Exact Date)   SpO2 100%   BMI 51.49 kg/m       Objective:   Physical Exam Vitals and nursing note reviewed.  Constitutional:      Appearance: Normal appearance.  Cardiovascular:      Rate and Rhythm: Normal rate and regular rhythm.     Pulses: Normal pulses.     Heart sounds: Normal heart sounds.  Pulmonary:     Effort: Pulmonary effort is normal.     Breath sounds: Normal breath sounds.  Abdominal:     General: Abdomen is flat. Bowel sounds are normal.     Palpations: Abdomen is soft.     Tenderness: There is abdominal tenderness.  Skin:    General: Skin is warm and dry.  Neurological:     General: No focal deficit present.     Mental Status: She is alert and oriented to person, place, and time.  Psychiatric:        Mood and Affect: Mood normal.        Behavior: Behavior normal.        Thought Content: Thought content normal.        Judgment: Judgment normal.        Assessment & Plan:  1. 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 Encouraged to take prednisone and metformin as directed by  Bariatric surgery  - Ambulatory referral to Gastroenterology - Follow up as needed   Dorothyann Peng, NP  Time spent with patient today was 34 minutes which consisted of chart review, discussing diagnosis, work up, treatment answering questions and documentation.

## 2022-11-26 DIAGNOSIS — M25562 Pain in left knee: Secondary | ICD-10-CM | POA: Diagnosis not present

## 2022-11-28 DIAGNOSIS — M25562 Pain in left knee: Secondary | ICD-10-CM | POA: Diagnosis not present

## 2022-12-03 DIAGNOSIS — M25562 Pain in left knee: Secondary | ICD-10-CM | POA: Diagnosis not present

## 2022-12-04 ENCOUNTER — Telehealth: Payer: Self-pay | Admitting: Adult Health

## 2022-12-04 DIAGNOSIS — E669 Obesity, unspecified: Secondary | ICD-10-CM | POA: Diagnosis not present

## 2022-12-04 DIAGNOSIS — E1169 Type 2 diabetes mellitus with other specified complication: Secondary | ICD-10-CM | POA: Diagnosis not present

## 2022-12-04 DIAGNOSIS — K654 Sclerosing mesenteritis: Secondary | ICD-10-CM | POA: Diagnosis not present

## 2022-12-04 NOTE — Telephone Encounter (Signed)
Spoke to pt and she state that the provider that she saw stated that they were unable to help her. Pt stated that the provider suggesting f/u with rheumatology and GI doctor. Pt stated she hasn't seen that specialist in 4 years and wants a referral. Please advise. Pt stated that the GI provider was unable to help she is looking for recommendations.

## 2022-12-04 NOTE — Telephone Encounter (Signed)
Pt call and want you to give her a call back the pt didn't want to tell be what it was about.

## 2022-12-05 DIAGNOSIS — M25562 Pain in left knee: Secondary | ICD-10-CM | POA: Diagnosis not present

## 2022-12-08 DIAGNOSIS — R1013 Epigastric pain: Secondary | ICD-10-CM | POA: Diagnosis not present

## 2022-12-08 DIAGNOSIS — R1310 Dysphagia, unspecified: Secondary | ICD-10-CM | POA: Diagnosis not present

## 2022-12-08 DIAGNOSIS — R112 Nausea with vomiting, unspecified: Secondary | ICD-10-CM | POA: Diagnosis not present

## 2022-12-08 DIAGNOSIS — Z9884 Bariatric surgery status: Secondary | ICD-10-CM | POA: Diagnosis not present

## 2022-12-08 DIAGNOSIS — R110 Nausea: Secondary | ICD-10-CM | POA: Diagnosis not present

## 2022-12-08 DIAGNOSIS — Z903 Acquired absence of stomach [part of]: Secondary | ICD-10-CM | POA: Diagnosis not present

## 2022-12-08 DIAGNOSIS — K654 Sclerosing mesenteritis: Secondary | ICD-10-CM | POA: Diagnosis not present

## 2022-12-09 NOTE — Telephone Encounter (Signed)
Pt stated that the GI provider still advised her to f/u with Rheumo and needs the referral.

## 2022-12-10 ENCOUNTER — Other Ambulatory Visit: Payer: Self-pay | Admitting: Adult Health

## 2022-12-10 DIAGNOSIS — M25562 Pain in left knee: Secondary | ICD-10-CM | POA: Diagnosis not present

## 2022-12-10 DIAGNOSIS — F439 Reaction to severe stress, unspecified: Secondary | ICD-10-CM | POA: Diagnosis not present

## 2022-12-10 DIAGNOSIS — K654 Sclerosing mesenteritis: Secondary | ICD-10-CM

## 2022-12-10 NOTE — Telephone Encounter (Signed)
Patient notified of update  and verbalized understanding. 

## 2022-12-16 DIAGNOSIS — R7303 Prediabetes: Secondary | ICD-10-CM | POA: Diagnosis not present

## 2022-12-16 DIAGNOSIS — M545 Low back pain, unspecified: Secondary | ICD-10-CM | POA: Diagnosis not present

## 2022-12-16 DIAGNOSIS — Z6841 Body Mass Index (BMI) 40.0 and over, adult: Secondary | ICD-10-CM | POA: Diagnosis not present

## 2022-12-16 DIAGNOSIS — M546 Pain in thoracic spine: Secondary | ICD-10-CM | POA: Diagnosis not present

## 2022-12-16 DIAGNOSIS — M25571 Pain in right ankle and joints of right foot: Secondary | ICD-10-CM | POA: Diagnosis not present

## 2022-12-16 DIAGNOSIS — S83282A Other tear of lateral meniscus, current injury, left knee, initial encounter: Secondary | ICD-10-CM | POA: Diagnosis not present

## 2022-12-16 DIAGNOSIS — E559 Vitamin D deficiency, unspecified: Secondary | ICD-10-CM | POA: Diagnosis not present

## 2022-12-16 DIAGNOSIS — Z79899 Other long term (current) drug therapy: Secondary | ICD-10-CM | POA: Diagnosis not present

## 2022-12-16 DIAGNOSIS — N915 Oligomenorrhea, unspecified: Secondary | ICD-10-CM | POA: Diagnosis not present

## 2022-12-17 DIAGNOSIS — M25562 Pain in left knee: Secondary | ICD-10-CM | POA: Diagnosis not present

## 2022-12-18 DIAGNOSIS — Z79899 Other long term (current) drug therapy: Secondary | ICD-10-CM | POA: Diagnosis not present

## 2022-12-19 DIAGNOSIS — M25562 Pain in left knee: Secondary | ICD-10-CM | POA: Diagnosis not present

## 2022-12-23 ENCOUNTER — Ambulatory Visit (INDEPENDENT_AMBULATORY_CARE_PROVIDER_SITE_OTHER): Payer: Medicaid Other | Admitting: Family Medicine

## 2022-12-23 ENCOUNTER — Other Ambulatory Visit (HOSPITAL_COMMUNITY)
Admission: RE | Admit: 2022-12-23 | Discharge: 2022-12-23 | Disposition: A | Discharge status: Home / Self Care | Payer: Medicaid Other | Source: Ambulatory Visit | Attending: Family Medicine | Admitting: Family Medicine

## 2022-12-23 ENCOUNTER — Encounter: Payer: Self-pay | Admitting: Family Medicine

## 2022-12-23 VITALS — BP 157/93 | HR 97 | Ht 74.5 in | Wt 397.0 lb

## 2022-12-23 DIAGNOSIS — R102 Pelvic and perineal pain: Secondary | ICD-10-CM | POA: Insufficient documentation

## 2022-12-23 DIAGNOSIS — R030 Elevated blood-pressure reading, without diagnosis of hypertension: Secondary | ICD-10-CM

## 2022-12-23 DIAGNOSIS — M25562 Pain in left knee: Secondary | ICD-10-CM | POA: Diagnosis not present

## 2022-12-23 DIAGNOSIS — N218 Other lower urinary tract calculus: Secondary | ICD-10-CM | POA: Diagnosis not present

## 2022-12-23 LAB — POCT URINALYSIS DIPSTICK
Bilirubin, UA: POSITIVE
Blood, UA: POSITIVE
Glucose, UA: NEGATIVE
Ketones, UA: NEGATIVE
Leukocytes, UA: NEGATIVE
Nitrite, UA: NEGATIVE
Protein, UA: POSITIVE — AB
Spec Grav, UA: 1.015 (ref 1.010–1.025)
Urobilinogen, UA: 1 E.U./dL
pH, UA: 6 (ref 5.0–8.0)

## 2022-12-23 MED ORDER — TAMSULOSIN HCL 0.4 MG PO CAPS: 0.4000 mg | ORAL_CAPSULE | Freq: Every day | ORAL | 2 refills | Status: DC

## 2022-12-23 NOTE — Progress Notes (Signed)
Pt reports having lower abdominal pain and vaginal pain for approx 1.5 weeks. Pt states the pain is more on the left side and feels like a "pulling, sharp" pain. Pt is also experiencing pain with intercourse. Denies vaginal discharge or odor. Denies new partner. Hx of ovarian cysts.

## 2022-12-23 NOTE — Progress Notes (Signed)
GYNECOLOGY OFFICE VISIT NOTE  History:   Maureen Lewis is a 27 y.o. G1P1001 here today for lower abdominal pain and vaginal pain for the past 1.5 weeks. She describes a sharp pain in her left flank/lower abdomen, that is intermittent and radiates down towards her vagina. She denies any abnormal vaginal discharge, bleeding. No urinary urgency or dysuria urgency or dysuria. No frank hematuria. She has had 2 kidney stones in the last 3 months. Was seen and diagnosed in the ER for the first one, which passed spontaneously, yet to see urology.   Past Medical History:  Diagnosis Date   Abnormal uterine bleeding    Acid reflux    Alpha thalassemia trait    Amenorrhea    Anemia    no current med.   Asthma    Back pain    Cesarean delivery delivered 10/11/2019   10/09/2019 - primary CS for failed IOL   Chest pain    resolved   Complication of anesthesia    states had to keep giving her anesthesia during EGD   Constipation    Depression    "I'm good"   Fibromyalgia    Finger mass, left 03/2017   middle finger   Gestational hypertension 09/23/2019   Guidelines for Antenatal Testing and Sonography  (with updated ICD-10 codes)  Updated  Oct 02, 2019 with Dr. Tama High  INDICATION U/S 2 X week NST/AFI  or full BPP wkly DELIVERY Diabetes   A1 - good control - O24.410    A2 - good control - O24.419      A2  - poor control or poor compliance - O24.419, E11.65   (Macrosomia or polyhydramnios) **E11.65 is extra code for poor control**    A2/B - O24.   Headache    Hepatitis 07/18/2022   Hepatitis B   History of kidney stones    Hypertension    Infection    UTI   Irritable bowel syndrome (IBS)    Joint pain    Morbid obesity with body mass index (BMI) of 45.0 to 49.9 in adult [facility])    Obesity during pregnancy, antepartum 04/06/2019   Body mass index is 53.71 kg/m.  Recommendations '[x]'$  Aspirin 81 mg daily after 12 weeks; discontinue after 36 weeks '[ ]'$  Nutrition consult '[ ]'$  Weight  gain 11-20 lbs for singleton and 25-35 lbs for twin pregnancy (IOM guidelines) Higher class of obesity patients recommended to gain closer to lower limit  Weight loss is associated with adverse outcomes '[ ]'$  Baseline and surveillance labs (pulled in fr   Plantar fasciitis, bilateral    resolved   Polycystic ovary syndrome    Pre-diabetes    no meds, diet controlled, diet not check blood sugar   Pregnancy induced hypertension    Shortness of breath    albuterol inhaler   Sleep apnea    no CPAP use   Supervision of normal first pregnancy 04/06/2019   [name]: [ x]Initial [ x]12 '[ ]'$ 20 '[ ]'$ 28 '[ ]'$ 32 '[ ]'$ 36 '[ ]'$ 38 '[ ]'$ 39 '[ ]'$ 40 Nursing Staff Provider Office Location [facility]  Dating   LMP Language  English  Anatomy US  Nml Flu Vaccine  Declined 07/28/19 Genetic Screen  NIPS: low risks   AFP:   negative  TDaP vaccine   08/25/19 Hgb A1C or  GTT Early A1C 5.8 Third trimester: GDM insulin Rhogam  NA   LAB RESULTS  Feeding Plan Breast Blood Type   Vertigo 2017  Past Surgical History:  Procedure Laterality Date   CESAREAN SECTION N/A 10/09/2019   Procedure: CESAREAN SECTION;  Surgeon: Aletha Halim, MD;  [facility];  Service: Obstetrics;  Laterality: N/A;   COLONOSCOPY WITH PROPOFOL  10/07/2016   DILATION AND CURETTAGE OF UTERUS     ESOPHAGOGASTRODUODENOSCOPY  12/21/2015   EXCISION MASS UPPER EXTREMETIES Left 04/21/2017   Procedure: EXCISION MASS LEFT MIDDLE FINGER;  Surgeon: Daryll Brod, MD;  Location: [place];  Service: Orthopedics;  Laterality: Left;   gastric sleeve  05/09/2022   IR FL GUIDED LOC OF NEEDLE/CATH TIP FOR SPINAL INJECTION RT  03/13/2020   KNEE ARTHROSCOPY WITH LATERAL MENISECTOMY Left 10/31/2022   Procedure: KNEE ARTHROSCOPY WITH PARTIAL LATERAL MENISECTOMY;  Surgeon: Nicholes Stairs, MD;  [facility];  Service: Orthopedics;  Laterality: Left;  75   KNEE ARTHROSCOPY WITH LATERAL RELEASE Left 10/31/2022   Procedure: KNEE ARTHROSCOPY  WITH LATERAL RELEASE AND LIMITED SYNOVECTOMY;  Surgeon: Nicholes Stairs, MD;  [facility]: [facility];  Service: Orthopedics;  Laterality: Left;  75   PHOTOCOAGULATION WITH LASER Left 12/13/2020   Procedure: RETINOPLEXY LEFT EYE;  Surgeon: Sherlynn Stalls, MD;  Location: [facility];  Service: Ophthalmology;  Laterality: Left;   SCLERAL BUCKLE Right 12/13/2020   Procedure: SCLERAL BUCKLE WITH CRYO;  Surgeon: Sherlynn Stalls, MD;  Location: [facility];  Service: Ophthalmology;  Laterality: Right;    The following portions of the patient's history were reviewed and updated as appropriate: allergies, current medications, past family history, past medical history, past social history, past surgical history and problem list.   Health Maintenance:  Normal pap in 03/2020.   Review of Systems:  Pertinent items noted in HPI and remainder of comprehensive ROS otherwise negative.  Physical Exam:  BP (!) 157/93   Pulse 97   Ht 6' 2.5" (1.892 m)   Wt (!) 397 lb (180.1 kg)   LMP 12/13/2022 (Exact Date)   BMI 50.29 kg/m  CONSTITUTIONAL: Well-developed, well-nourished female in no acute distress.  HEENT:  Normocephalic, atraumatic. External right and left ear normal. No scleral icterus.  NECK: Normal range of motion, supple, no masses noted on observation SKIN: No rash noted. Not diaphoretic. No erythema. No pallor. MUSCULOSKELETAL: Normal range of motion. No edema noted. NEUROLOGIC: Alert and oriented to person, place, and time. Normal muscle tone coordination. No cranial nerve deficit noted. PSYCHIATRIC: Normal mood and affect. Normal behavior. Normal judgment and thought content. CARDIOVASCULAR: Normal heart rate noted RESPIRATORY: Effort and breath sounds normal, no problems with respiration noted ABDOMEN: No masses noted. No other overt distention noted.  Tenderness to palpation over Left flank. PELVIC: Deferred  Labs and Imaging POCT urinalysis dipstick   Collection Time: 12/23/22  3:42 PM  Result Value  Ref Range   Color, UA Dark yellow    Clarity, UA Hazy    Glucose, UA Negative Negative   Bilirubin, UA Positive    Ketones, UA Negative    Spec Grav, UA 1.015 1.010 - 1.025   Blood, UA Positive    pH, UA 6.0 5.0 - 8.0   Protein, UA Positive (A) Negative   Urobilinogen, UA 1.0 0.2 or 1.0 E.U./dL   Nitrite, UA Negative    Leukocytes, UA Negative Negative   Appearance     Odor    Urine Culture   Collection Time: 12/23/22  4:03 PM   Specimen: Urine   UR   No results found.    Assessment and Plan:  Calculus of  other lower urinary tract location Symptoms, history and hematuria noted on UA suggest possible recurrence of renal stones. Discussed seeing urology for further imaging, as she may have mores tones. Will give tamsulosin to aid expulsion and patient will watch out for stone passage - NSAIDs ok for pain.  - referral to urology sent. -     Tamsulosin HCl; Take 1 capsule (0.4 mg total) by mouth daily.  Dispense: 30 capsule; Refill: 2 -     Ambulatory referral to Urology  Vaginal pain -     Cervicovaginal ancillary only -     POCT urinalysis dipstick -     Urine Culture  Elevated blood pressure reading Discussed with patient, recommend close follow up with PCP, given long term effects of untreated Hypertension.  No follow-ups on file.    I spent  32  minutes dedicated to the care of this patient including pre-visit review of records, face to face time with the patient discussing her conditions and treatments and post visit orders.  Liliane Channel MD MPH OB Fellow, [place] [facility] [place] 12/30/2022

## 2022-12-23 NOTE — Patient Instructions (Signed)
Follow up with urology. Referral sent.    Please follow up with your PCP to discuss your elevated blood pressure.

## 2022-12-24 ENCOUNTER — Other Ambulatory Visit: Payer: Self-pay | Admitting: Family Medicine

## 2022-12-24 DIAGNOSIS — H6690 Otitis media, unspecified, unspecified ear: Secondary | ICD-10-CM

## 2022-12-24 DIAGNOSIS — B379 Candidiasis, unspecified: Secondary | ICD-10-CM

## 2022-12-24 DIAGNOSIS — B9689 Other specified bacterial agents as the cause of diseases classified elsewhere: Secondary | ICD-10-CM

## 2022-12-24 LAB — CERVICOVAGINAL ANCILLARY ONLY
Bacterial Vaginitis (gardnerella): POSITIVE — AB
Candida Glabrata: NEGATIVE
Candida Vaginitis: POSITIVE — AB
Chlamydia: NEGATIVE
Comment: NEGATIVE
Comment: NEGATIVE
Comment: NEGATIVE
Comment: NEGATIVE
Comment: NEGATIVE
Comment: NORMAL
Neisseria Gonorrhea: NEGATIVE
Trichomonas: NEGATIVE

## 2022-12-24 MED ORDER — FLUCONAZOLE 150 MG PO TABS: 150.0000 mg | ORAL_TABLET | Freq: Every day | ORAL | 0 refills | Status: DC

## 2022-12-24 MED ORDER — METRONIDAZOLE 500 MG PO TABS: 500.0000 mg | ORAL_TABLET | Freq: Two times a day (BID) | ORAL | 0 refills | Status: DC

## 2022-12-25 DIAGNOSIS — F439 Reaction to severe stress, unspecified: Secondary | ICD-10-CM | POA: Diagnosis not present

## 2022-12-25 LAB — URINE CULTURE

## 2022-12-30 DIAGNOSIS — M25562 Pain in left knee: Secondary | ICD-10-CM | POA: Diagnosis not present

## 2022-12-31 DIAGNOSIS — Z4789 Encounter for other orthopedic aftercare: Secondary | ICD-10-CM | POA: Diagnosis not present

## 2023-01-02 DIAGNOSIS — E119 Type 2 diabetes mellitus without complications: Secondary | ICD-10-CM | POA: Diagnosis not present

## 2023-01-02 DIAGNOSIS — K219 Gastro-esophageal reflux disease without esophagitis: Secondary | ICD-10-CM | POA: Diagnosis not present

## 2023-01-02 DIAGNOSIS — R1310 Dysphagia, unspecified: Secondary | ICD-10-CM | POA: Diagnosis not present

## 2023-01-02 DIAGNOSIS — G4733 Obstructive sleep apnea (adult) (pediatric): Secondary | ICD-10-CM | POA: Diagnosis not present

## 2023-01-02 DIAGNOSIS — Z9884 Bariatric surgery status: Secondary | ICD-10-CM | POA: Diagnosis not present

## 2023-01-02 DIAGNOSIS — K449 Diaphragmatic hernia without obstruction or gangrene: Secondary | ICD-10-CM | POA: Diagnosis not present

## 2023-01-02 DIAGNOSIS — F32A Depression, unspecified: Secondary | ICD-10-CM | POA: Diagnosis not present

## 2023-01-02 DIAGNOSIS — Z6841 Body Mass Index (BMI) 40.0 and over, adult: Secondary | ICD-10-CM | POA: Diagnosis not present

## 2023-01-02 DIAGNOSIS — R1013 Epigastric pain: Secondary | ICD-10-CM | POA: Diagnosis not present

## 2023-01-02 DIAGNOSIS — R112 Nausea with vomiting, unspecified: Secondary | ICD-10-CM | POA: Diagnosis not present

## 2023-01-02 DIAGNOSIS — I10 Essential (primary) hypertension: Secondary | ICD-10-CM | POA: Diagnosis not present

## 2023-01-06 ENCOUNTER — Ambulatory Visit: Payer: Medicaid Other | Admitting: Urology

## 2023-01-06 ENCOUNTER — Encounter: Payer: Self-pay | Admitting: Urology

## 2023-01-06 ENCOUNTER — Encounter: Payer: Medicaid Other | Admitting: Urology

## 2023-01-06 ENCOUNTER — Telehealth: Payer: Self-pay | Admitting: Urology

## 2023-01-06 VITALS — BP 130/92 | HR 80 | Ht 74.0 in | Wt 391.0 lb

## 2023-01-06 DIAGNOSIS — R590 Localized enlarged lymph nodes: Secondary | ICD-10-CM | POA: Diagnosis not present

## 2023-01-06 DIAGNOSIS — R3129 Other microscopic hematuria: Secondary | ICD-10-CM

## 2023-01-06 LAB — MICROSCOPIC EXAMINATION
Cast Type: NONE SEEN
Casts: NONE SEEN /lpf
Crystal Type: NONE SEEN
Crystals: NONE SEEN
Epithelial Cells (non renal): >10 /hpf — AB (ref 0–10)
Renal Epithel, UA: NONE SEEN /hpf
Trichomonas, UA: NONE SEEN
Yeast, UA: NONE SEEN

## 2023-01-06 LAB — URINALYSIS, ROUTINE W REFLEX MICROSCOPIC
Bilirubin, UA: NEGATIVE
Glucose, UA: NEGATIVE
Nitrite, UA: NEGATIVE
Protein,UA: NEGATIVE
Specific Gravity, UA: 1.025 (ref 1.005–1.030)
Urobilinogen, Ur: 1 mg/dL (ref 0.2–1.0)
pH, UA: 6.5 (ref 5.0–7.5)

## 2023-01-06 NOTE — Telephone Encounter (Signed)
Patient called and stated that she had CT SCAN done today at a [facility] location ([place] off of [facility]) and didn't know if this CT scan could be used in regards to what the provider needs so she can schedule her F/U appointment sooner?  Patient stated that she has a trip coming up and needs her F/U appt to be sooner than what it is. Please Advise.  Patients callback #: [phone]

## 2023-01-06 NOTE — Progress Notes (Signed)
Assessment: 1. Microscopic hematuria      Plan: FU after CT stone study for cystoscopy for further evaluation of her microscopic hematuria.  Petra Kuba of procedure including rationale as well as potential adverse events and side effects reviewed.  Patient agrees to proceed.  Chief Complaint: ? Kidney stone  History of Present Illness:  Maureen Lewis is a 27 y.o. female who is seen in consultation from Dorothyann Peng, NP for evaluation of ? Urinary stones.   Patient has a past medical history of morbid obesity with associated hypoventilation syndrome, polycystic ovarian syndrome and GERD.  Patient states that she was told at an urgent care that it sounded like she was passing a stone.  She has never seen or captured a stone and has never been evaluated by urology previously.  She has had a number of scans including most recently January 2024 that showed normal kidneys without focal lesions stone or hydro-.  The ureters appeared nondilated. PCP has scheduled a CT stone study  The patient has however had documented significant microscopic hematuria.  On urinalysis today she has 6-10 RBC/hpf.       Past Medical History:  Past Medical History:  Diagnosis Date   Abnormal uterine bleeding    Acid reflux    Alpha thalassemia trait    Amenorrhea    Anemia    no current med.   Asthma    Back pain    Cesarean delivery delivered 10/11/2019   10/09/2019 - primary CS for failed IOL   Chest pain    resolved   Complication of anesthesia    states had to keep giving her anesthesia during EGD   Constipation    Depression    "I'm good"   Fibromyalgia    Finger mass, left 03/2017   middle finger   Gestational hypertension 09/23/2019   Guidelines for Antenatal Testing and Sonography  (with updated ICD-10 codes)  Updated  09-15-19 with Dr. Tama High  INDICATION U/S 2 X week NST/AFI  or full BPP wkly DELIVERY Diabetes   A1 - good control - O24.410    A2 - good control -  O24.419      A2  - poor control or poor compliance - O24.419, E11.65   (Macrosomia or polyhydramnios) **E11.65 is extra code for poor control**    A2/B - O24.   Headache    Hepatitis 07/18/2022   Hepatitis B   History of kidney stones    Hypertension    Infection    UTI   Irritable bowel syndrome (IBS)    Joint pain    Morbid obesity with body mass index (BMI) of 45.0 to 49.9 in adult [facility])    Obesity during pregnancy, antepartum 04/06/2019   Body mass index is 53.71 kg/m.  Recommendations [x]  Aspirin 81 mg daily after 12 weeks; discontinue after 36 weeks [ ]  Nutrition consult [ ]  Weight gain 11-20 lbs for singleton and 25-35 lbs for twin pregnancy (IOM guidelines) Higher class of obesity patients recommended to gain closer to lower limit  Weight loss is associated with adverse outcomes [ ]  Baseline and surveillance labs (pulled in fr   Plantar fasciitis, bilateral    resolved   Polycystic ovary syndrome    Pre-diabetes    no meds, diet controlled, diet not check blood sugar   Pregnancy induced hypertension    Shortness of breath    albuterol inhaler   Sleep apnea    no CPAP use  Supervision of normal first pregnancy 04/06/2019   [name]: [ x]Initial [ x]12 [ ] 20 [ ] 28 [ ] 32 [ ] 36 [ ] 38 [ ] 39 [ ] 40 Nursing Staff Provider Office Location [facility]  Dating   LMP Language  English  Anatomy US  Nml Flu Vaccine  Declined 07/28/19 Genetic Screen  NIPS: low risks   AFP:   negative  TDaP vaccine   08/25/19 Hgb A1C or  GTT Early A1C 5.8 Third trimester: GDM insulin Rhogam  NA   LAB RESULTS  Feeding Plan Breast Blood Type   Vertigo 2017    Past Surgical History:  Past Surgical History:  Procedure Laterality Date   CESAREAN SECTION N/A 10/09/2019   Procedure: CESAREAN SECTION;  Surgeon: Aletha Halim, MD;  Location: [facility];  Service: Obstetrics;  Laterality: N/A;   COLONOSCOPY WITH PROPOFOL  10/07/2016   DILATION AND CURETTAGE OF UTERUS     ESOPHAGOGASTRODUODENOSCOPY   12/21/2015   EXCISION MASS UPPER EXTREMETIES Left 04/21/2017   Procedure: EXCISION MASS LEFT MIDDLE FINGER;  Surgeon: Daryll Brod, MD;  Location: [place];  Service: Orthopedics;  Laterality: Left;   gastric sleeve  05/09/2022   IR FL GUIDED LOC OF NEEDLE/CATH TIP FOR SPINAL INJECTION RT  03/13/2020   KNEE ARTHROSCOPY WITH LATERAL MENISECTOMY Left 10/31/2022   Procedure: KNEE ARTHROSCOPY WITH PARTIAL LATERAL MENISECTOMY;  Surgeon: Nicholes Stairs, MD;  Location: [facility];  Service: Orthopedics;  Laterality: Left;  75   KNEE ARTHROSCOPY WITH LATERAL RELEASE Left 10/31/2022   Procedure: KNEE ARTHROSCOPY WITH LATERAL RELEASE AND LIMITED SYNOVECTOMY;  Surgeon: Nicholes Stairs, MD;  Location: [facility];  Service: Orthopedics;  Laterality: Left;  75   PHOTOCOAGULATION WITH LASER Left 12/13/2020   Procedure: RETINOPLEXY LEFT EYE;  Surgeon: Sherlynn Stalls, MD;  Location: [place];  Service: Ophthalmology;  Laterality: Left;   SCLERAL BUCKLE Right 12/13/2020   Procedure: SCLERAL BUCKLE WITH CRYO;  Surgeon: Sherlynn Stalls, MD;  Location: [place];  Service: Ophthalmology;  Laterality: Right;    Allergies:  Allergies  Allergen Reactions   Bee Venom Hives, Shortness Of Breath and Swelling   Hydrocodone-Acetaminophen Anaphylaxis    No problem when she takes Tylenol   Peach Flavor Hives, Shortness Of Breath and Other (See Comments)    SWELLING OF MOUTH   Pollen Extract Hives, Shortness Of Breath and Swelling   Remdesivir Swelling    Angioedema 8/25   Latex Rash   Cortisone Itching and Swelling   Cortizone-10 [Hydrocortisone]     Swelling and itching   Toradol [Ketorolac Tromethamine] Swelling    Family History:  Family History  Problem Relation Age of Onset   Diabetes Maternal Aunt    Hypertension Maternal Uncle    Depression Maternal Grandfather    Diabetes Maternal Grandfather    Hypertension Maternal Grandfather    Arthritis Mother    High blood pressure Mother     Depression Mother    Sleep apnea Mother    Obesity Mother    Diabetes Mother    Hypertension Mother    Kidney failure Mother    Sleep apnea Father    Obesity Father    Healthy Daughter     Social History:  Social History   Tobacco Use   Smoking status: Never   Smokeless tobacco: Never  Vaping Use   Vaping Use: Never used  Substance Use Topics   Alcohol use: Not Currently    Comment: social    Drug use: No  Review of symptoms:  Constitutional:  Negative for unexplained weight loss, night sweats, fever, chills ENT:  Negative for nose bleeds, sinus pain, painful swallowing CV:  Negative for chest pain, occasional shortness of breath and ++exercise intolerance,  Resp:  Negative for cough, wheezing, shortness of breath GI:  Negative for nausea, vomiting, diarrhea, bloody stools GU:  Positives noted in HPI; otherwise negative for gross hematuria, dysuria, urinary incontinence Neuro:  Negative for seizures, poor balance, limb weakness, slurred speech Psych:  Negative for lack of energy, depression, anxiety Endocrine:  Negative for polydipsia, polyuria, symptoms of hypoglycemia (dizziness, hunger, sweating) Hematologic:  Positive for anemia Allergic:  positive for muiltiple allergies-- see list  Physical exam: BP (!) 130/92   Pulse 80   Ht 6\' 2"  (1.88 m)   Wt (!) 391 lb (177.4 kg)   LMP 12/13/2022 (Exact Date)   BMI 50.20 kg/m  GENERAL APPEARANCE:  Well appearing, well developed, well nourished, NAD   Results: Urinalysis today is significant for 6-10 RBC/hpf

## 2023-01-07 ENCOUNTER — Encounter: Payer: Self-pay | Admitting: Urology

## 2023-01-07 NOTE — Telephone Encounter (Signed)
Patient called back and wanted to know if you, Christal, would send the patient a MyChart message because it will not let her directly send the results through a message and she was wanting to know if you would message her so she could reply back to you.

## 2023-01-08 DIAGNOSIS — M25562 Pain in left knee: Secondary | ICD-10-CM | POA: Diagnosis not present

## 2023-01-12 ENCOUNTER — Encounter: Payer: Self-pay | Admitting: Urology

## 2023-01-12 DIAGNOSIS — M546 Pain in thoracic spine: Secondary | ICD-10-CM | POA: Diagnosis not present

## 2023-01-12 DIAGNOSIS — M545 Low back pain, unspecified: Secondary | ICD-10-CM | POA: Diagnosis not present

## 2023-01-12 DIAGNOSIS — S83282A Other tear of lateral meniscus, current injury, left knee, initial encounter: Secondary | ICD-10-CM | POA: Diagnosis not present

## 2023-01-12 DIAGNOSIS — F439 Reaction to severe stress, unspecified: Secondary | ICD-10-CM | POA: Diagnosis not present

## 2023-01-12 DIAGNOSIS — M25571 Pain in right ankle and joints of right foot: Secondary | ICD-10-CM | POA: Diagnosis not present

## 2023-01-12 DIAGNOSIS — N915 Oligomenorrhea, unspecified: Secondary | ICD-10-CM | POA: Diagnosis not present

## 2023-01-12 DIAGNOSIS — Z79899 Other long term (current) drug therapy: Secondary | ICD-10-CM | POA: Diagnosis not present

## 2023-01-12 DIAGNOSIS — Z6841 Body Mass Index (BMI) 40.0 and over, adult: Secondary | ICD-10-CM | POA: Diagnosis not present

## 2023-01-12 DIAGNOSIS — M25562 Pain in left knee: Secondary | ICD-10-CM | POA: Diagnosis not present

## 2023-01-13 ENCOUNTER — Ambulatory Visit: Payer: Medicaid Other | Admitting: Urology

## 2023-01-13 VITALS — BP 149/92 | HR 80 | Wt 391.0 lb

## 2023-01-13 DIAGNOSIS — R319 Hematuria, unspecified: Secondary | ICD-10-CM | POA: Diagnosis not present

## 2023-01-13 DIAGNOSIS — R3129 Other microscopic hematuria: Secondary | ICD-10-CM

## 2023-01-13 LAB — URINALYSIS, ROUTINE W REFLEX MICROSCOPIC
Bilirubin, UA: NEGATIVE
Glucose, UA: NEGATIVE
Ketones, UA: NEGATIVE
Leukocytes,UA: NEGATIVE
Nitrite, UA: NEGATIVE
Specific Gravity, UA: 1.025 (ref 1.005–1.030)
Urobilinogen, Ur: 1 mg/dL (ref 0.2–1.0)
pH, UA: 6 (ref 5.0–7.5)

## 2023-01-13 LAB — MICROSCOPIC EXAMINATION
Cast Type: NONE SEEN
Casts: NONE SEEN /lpf
Crystal Type: NONE SEEN
Crystals: NONE SEEN
Mucus, UA: NONE SEEN
RBC, Urine: >30 /hpf — AB (ref 0–2)
Renal Epithel, UA: NONE SEEN /hpf
Trichomonas, UA: NONE SEEN
Yeast, UA: NONE SEEN

## 2023-01-13 MED ORDER — CIPROFLOXACIN HCL 500 MG PO TABS
500.0000 mg | ORAL_TABLET | Freq: Once | ORAL | Status: AC
  Administered 2023-01-13: 500 mg via ORAL

## 2023-01-13 NOTE — Progress Notes (Signed)
Assessment: 1. Microscopic hematuria     Plan: Today following the cystoscopy I discussed with the patient that her GU evaluation for hematuria is unrevealing.  I have recommended that we proceed with obtaining a hematuria profile given the potential for renal parenchymal bleeding.  The patient will return in a few weeks and at that time we will make further recommendations depending on results.  Chief Complaint: Blood and urine  HPI: Maureen Lewis is a 27 y.o. female who presents for continued evaluation of microscopic hematuria.  Please see my note 01/06/2023 at the time of initial visit for detailed history and exam.  The patient had a CT A/P with contrast at [facility] 12/2022 that I have reviewed that showed no upper urinary tract issues.  No evidence of stones. She previously had a CT with contrast also in January 2024 that was unremarkable as well from a urinary standpoint.  She has however had documented microscopic hematuria and presents here today for cystoscopy.  Cystoscopy 12/2022-negative for significant findings-see full report below   Portions of the above documentation were copied from a prior visit for review purposes only.  Allergies: Allergies  Allergen Reactions   Bee Venom Hives, Shortness Of Breath and Swelling   Hydrocodone-Acetaminophen Anaphylaxis    No problem when she takes Tylenol   Peach Flavor Hives, Shortness Of Breath and Other (See Comments)    SWELLING OF MOUTH   Pollen Extract Hives, Shortness Of Breath and Swelling   Remdesivir Swelling    Angioedema 8/25   Latex Rash   Cortisone Itching and Swelling   Cortizone-10 [Hydrocortisone]     Swelling and itching   Toradol [Ketorolac Tromethamine] Swelling    PMH: Past Medical History:  Diagnosis Date   Abnormal uterine bleeding    Acid reflux    Alpha thalassemia trait    Amenorrhea    Anemia    no current med.   Asthma    Back pain    Cesarean delivery delivered 10/11/2019    10/09/2019 - primary CS for failed IOL   Chest pain    resolved   Complication of anesthesia    states had to keep giving her anesthesia during EGD   Constipation    Depression    "I'm good"   Fibromyalgia    Finger mass, left 03/2017   middle finger   Gestational hypertension 09/23/2019   Guidelines for Antenatal Testing and Sonography  (with updated ICD-10 codes)  Updated  09-25-2019 with Dr. Tama High  INDICATION U/S 2 X week NST/AFI  or full BPP wkly DELIVERY Diabetes   A1 - good control - O24.410    A2 - good control - O24.419      A2  - poor control or poor compliance - O24.419, E11.65   (Macrosomia or polyhydramnios) **E11.65 is extra code for poor control**    A2/B - O24.   Headache    Hepatitis 07/18/2022   Hepatitis B   History of kidney stones    Hypertension    Infection    UTI   Irritable bowel syndrome (IBS)    Joint pain    Morbid obesity with body mass index (BMI) of 45.0 to 49.9 in adult [facility])    Obesity during pregnancy, antepartum 04/06/2019   Body mass index is 53.71 kg/m.  Recommendations [x]  Aspirin 81 mg daily after 12 weeks; discontinue after 36 weeks [ ]  Nutrition consult [ ]  Weight gain 11-20 lbs for singleton and 25-35 lbs  for twin pregnancy (IOM guidelines) Higher class of obesity patients recommended to gain closer to lower limit  Weight loss is associated with adverse outcomes [ ]  Baseline and surveillance labs (pulled in fr   Plantar fasciitis, bilateral    resolved   Polycystic ovary syndrome    Pre-diabetes    no meds, diet controlled, diet not check blood sugar   Pregnancy induced hypertension    Shortness of breath    albuterol inhaler   Sleep apnea    no CPAP use   Supervision of normal first pregnancy 04/06/2019   [name]: [ x]Initial [ x]12 [ ] 20 [ ] 28 [ ] 32 [ ] 36 [ ] 38 [ ] 39 [ ] 40 Nursing Staff Provider Office Location [facility]  Dating   LMP Language  English  Anatomy US  Nml Flu Vaccine  Declined 07/28/19 Genetic Screen   NIPS: low risks   AFP:   negative  TDaP vaccine   08/25/19 Hgb A1C or  GTT Early A1C 5.8 Third trimester: GDM insulin Rhogam  NA   LAB RESULTS  Feeding Plan Breast Blood Type   Vertigo 2017    PSH: Past Surgical History:  Procedure Laterality Date   CESAREAN SECTION N/A 10/09/2019   Procedure: CESAREAN SECTION;  Surgeon: Aletha Halim, MD;  [facility]: [facility];  Service: Obstetrics;  Laterality: N/A;   COLONOSCOPY WITH PROPOFOL  10/07/2016   DILATION AND CURETTAGE OF UTERUS     ESOPHAGOGASTRODUODENOSCOPY  12/21/2015   EXCISION MASS UPPER EXTREMETIES Left 04/21/2017   Procedure: EXCISION MASS LEFT MIDDLE FINGER;  Surgeon: Daryll Brod, MD;  Location: [place];  Service: Orthopedics;  Laterality: Left;   gastric sleeve  05/09/2022   IR FL GUIDED LOC OF NEEDLE/CATH TIP FOR SPINAL INJECTION RT  03/13/2020   KNEE ARTHROSCOPY WITH LATERAL MENISECTOMY Left 10/31/2022   Procedure: KNEE ARTHROSCOPY WITH PARTIAL LATERAL MENISECTOMY;  Surgeon: Nicholes Stairs, MD;  Location: [facility];  Service: Orthopedics;  Laterality: Left;  75   KNEE ARTHROSCOPY WITH LATERAL RELEASE Left 10/31/2022   Procedure: KNEE ARTHROSCOPY WITH LATERAL RELEASE AND LIMITED SYNOVECTOMY;  Surgeon: Nicholes Stairs, MD;  Location: [facility];  Service: Orthopedics;  Laterality: Left;  75   PHOTOCOAGULATION WITH LASER Left 12/13/2020   Procedure: RETINOPLEXY LEFT EYE;  Surgeon: Sherlynn Stalls, MD;  Location: [place];  Service: Ophthalmology;  Laterality: Left;   SCLERAL BUCKLE Right 12/13/2020   Procedure: SCLERAL BUCKLE WITH CRYO;  Surgeon: Sherlynn Stalls, MD;  Location: [place];  Service: Ophthalmology;  Laterality: Right;    SH: Social History   Tobacco Use   Smoking status: Never   Smokeless tobacco: Never  Vaping Use   Vaping Use: Never used  Substance Use Topics   Alcohol use: Not Currently    Comment: social    Drug use: No    ROS: Constitutional:  Negative for fever, chills, weight loss CV:  Negative for chest pain, previous MI, hypertension Respiratory:  Negative for shortness of breath, wheezing, sleep apnea, frequent cough GI:  Negative for nausea, vomiting, bloody stool, GERD  PE: BP (!) 149/92   Pulse 80   Wt (!) 391 lb (177.4 kg)   LMP 12/13/2022 (Exact Date)   BMI 50.20 kg/m  GENERAL APPEARANCE: Obese black female, NAD    Procedure:  Flexible Cystourethroscopy  Pre-operative Diagnosis: Microscopic hematuria  Post-operative Diagnosis: Microscopic hematuria  Anesthesia:  local with lidocaine jelly  Surgical Narrative:  After appropriate informed consent was obtained, the patient was prepped  and draped in the usual sterile fashion in the supine position.  The patient was correctly identified and the proper procedure delineated prior to proceeding.  Sterile lidocaine gel was instilled in the urethra. The flexible cystoscope was introduced without difficulty.  Findings:  urethra: Normal  Bladder: No mucosal abnormalities.  Ureteral orifices: normal  Additional findings:none  Saline bladder wash for cytology was not performed.    The cystoscope was then removed.  The patient tolerated the procedure well.

## 2023-01-14 DIAGNOSIS — Z79899 Other long term (current) drug therapy: Secondary | ICD-10-CM | POA: Diagnosis not present

## 2023-01-14 DIAGNOSIS — M25562 Pain in left knee: Secondary | ICD-10-CM | POA: Diagnosis not present

## 2023-01-20 ENCOUNTER — Other Ambulatory Visit: Payer: Medicaid Other | Admitting: Urology

## 2023-01-21 ENCOUNTER — Encounter: Payer: Self-pay | Admitting: Urology

## 2023-01-21 DIAGNOSIS — Z113 Encounter for screening for infections with a predominantly sexual mode of transmission: Secondary | ICD-10-CM | POA: Diagnosis not present

## 2023-01-21 DIAGNOSIS — R87610 Atypical squamous cells of undetermined significance on cytologic smear of cervix (ASC-US): Secondary | ICD-10-CM | POA: Diagnosis not present

## 2023-01-21 DIAGNOSIS — Z0000 Encounter for general adult medical examination without abnormal findings: Secondary | ICD-10-CM | POA: Diagnosis not present

## 2023-01-21 DIAGNOSIS — Z124 Encounter for screening for malignant neoplasm of cervix: Secondary | ICD-10-CM | POA: Diagnosis not present

## 2023-01-21 DIAGNOSIS — N926 Irregular menstruation, unspecified: Secondary | ICD-10-CM | POA: Diagnosis not present

## 2023-01-21 DIAGNOSIS — Z1151 Encounter for screening for human papillomavirus (HPV): Secondary | ICD-10-CM | POA: Diagnosis not present

## 2023-01-21 DIAGNOSIS — Z6841 Body Mass Index (BMI) 40.0 and over, adult: Secondary | ICD-10-CM | POA: Diagnosis not present

## 2023-01-21 DIAGNOSIS — F439 Reaction to severe stress, unspecified: Secondary | ICD-10-CM | POA: Diagnosis not present

## 2023-01-21 DIAGNOSIS — E119 Type 2 diabetes mellitus without complications: Secondary | ICD-10-CM | POA: Diagnosis not present

## 2023-01-23 ENCOUNTER — Other Ambulatory Visit: Payer: Medicaid Other

## 2023-01-26 ENCOUNTER — Ambulatory Visit: Payer: Medicaid Other | Admitting: Obstetrics and Gynecology

## 2023-01-27 ENCOUNTER — Other Ambulatory Visit: Payer: Medicaid Other | Admitting: Urology

## 2023-02-04 DIAGNOSIS — F439 Reaction to severe stress, unspecified: Secondary | ICD-10-CM | POA: Diagnosis not present

## 2023-02-05 DIAGNOSIS — Z9889 Other specified postprocedural states: Secondary | ICD-10-CM | POA: Diagnosis not present

## 2023-02-05 DIAGNOSIS — M1712 Unilateral primary osteoarthritis, left knee: Secondary | ICD-10-CM | POA: Diagnosis not present

## 2023-02-06 DIAGNOSIS — M25562 Pain in left knee: Secondary | ICD-10-CM | POA: Diagnosis not present

## 2023-02-10 DIAGNOSIS — F439 Reaction to severe stress, unspecified: Secondary | ICD-10-CM | POA: Diagnosis not present

## 2023-02-11 DIAGNOSIS — T8332XA Displacement of intrauterine contraceptive device, initial encounter: Secondary | ICD-10-CM | POA: Diagnosis not present

## 2023-02-12 ENCOUNTER — Encounter: Payer: Self-pay | Admitting: Urology

## 2023-02-12 ENCOUNTER — Ambulatory Visit: Payer: Medicaid Other | Admitting: Urology

## 2023-02-16 DIAGNOSIS — M25562 Pain in left knee: Secondary | ICD-10-CM | POA: Diagnosis not present

## 2023-02-18 DIAGNOSIS — F439 Reaction to severe stress, unspecified: Secondary | ICD-10-CM | POA: Diagnosis not present

## 2023-02-23 DIAGNOSIS — E559 Vitamin D deficiency, unspecified: Secondary | ICD-10-CM | POA: Diagnosis not present

## 2023-02-23 DIAGNOSIS — R635 Abnormal weight gain: Secondary | ICD-10-CM | POA: Diagnosis not present

## 2023-02-23 DIAGNOSIS — Z79899 Other long term (current) drug therapy: Secondary | ICD-10-CM | POA: Diagnosis not present

## 2023-02-23 DIAGNOSIS — Z6841 Body Mass Index (BMI) 40.0 and over, adult: Secondary | ICD-10-CM | POA: Diagnosis not present

## 2023-02-23 DIAGNOSIS — R7303 Prediabetes: Secondary | ICD-10-CM | POA: Diagnosis not present

## 2023-02-23 DIAGNOSIS — N915 Oligomenorrhea, unspecified: Secondary | ICD-10-CM | POA: Diagnosis not present

## 2023-02-23 DIAGNOSIS — M546 Pain in thoracic spine: Secondary | ICD-10-CM | POA: Diagnosis not present

## 2023-02-23 DIAGNOSIS — M545 Low back pain, unspecified: Secondary | ICD-10-CM | POA: Diagnosis not present

## 2023-02-23 DIAGNOSIS — M25571 Pain in right ankle and joints of right foot: Secondary | ICD-10-CM | POA: Diagnosis not present

## 2023-02-25 ENCOUNTER — Ambulatory Visit
Admission: EM | Admit: 2023-02-25 | Discharge: 2023-02-25 | Disposition: A | Discharge status: Home / Self Care | Payer: Medicaid Other | Attending: Internal Medicine | Admitting: Internal Medicine

## 2023-02-25 DIAGNOSIS — N946 Dysmenorrhea, unspecified: Secondary | ICD-10-CM | POA: Diagnosis not present

## 2023-02-25 DIAGNOSIS — Z79899 Other long term (current) drug therapy: Secondary | ICD-10-CM | POA: Diagnosis not present

## 2023-02-25 DIAGNOSIS — N923 Ovulation bleeding: Secondary | ICD-10-CM | POA: Diagnosis not present

## 2023-02-25 DIAGNOSIS — Z113 Encounter for screening for infections with a predominantly sexual mode of transmission: Secondary | ICD-10-CM | POA: Diagnosis not present

## 2023-02-25 LAB — POCT URINALYSIS DIP (MANUAL ENTRY)
Bilirubin, UA: NEGATIVE
Blood, UA: NEGATIVE
Glucose, UA: NEGATIVE mg/dL
Ketones, POC UA: NEGATIVE mg/dL
Leukocytes, UA: NEGATIVE
Nitrite, UA: NEGATIVE
Protein Ur, POC: NEGATIVE mg/dL
Spec Grav, UA: 1.025 (ref 1.010–1.025)
Urobilinogen, UA: 1 E.U./dL
pH, UA: 7 (ref 5.0–8.0)

## 2023-02-25 LAB — POCT URINE PREGNANCY: Preg Test, Ur: NEGATIVE

## 2023-02-25 NOTE — ED Triage Notes (Signed)
Pt presents to UC with c/o lower abd/vag pain x5 days. Spotting x1 week. IUD was removed by obgyn 3 weeks ago. Tylenol taken this morning.

## 2023-02-25 NOTE — ED Provider Notes (Addendum)
[facility]    CSN: [id number] Arrival date & time: 02/25/23  7829      History   Chief Complaint Chief Complaint  Patient presents with   Abdominal Pain    HPI Maureen Lewis is a 26 y.o. female presents for evaluation of menstrual cramps and spotting.  Patient had her IUD removed by her GYN 3 weeks ago.  Over the past 5 days she has had lower abdominal/menstrual cramping with spotting.  Denies any nausea/vomiting, diarrhea.  No fevers.  Reports she had an IUD secondary to menorrhagia.  She has an appoint with her GYN next week to have another IUD placed.  She is concerned for possible pregnancy risk.  She has been taking Tylenol and her prescription oxycodone for her pain.  She is unable to take NSAIDs due to history of gastric bypass and also has an allergy to Toradol.  Denies STD exposure but would like screening while here.  No other concerns at this time.   Abdominal Pain   Past Medical History:  Diagnosis Date   Abnormal uterine bleeding    Acid reflux    Alpha thalassemia trait    Amenorrhea    Anemia    no current med.   Asthma    Back pain    Cesarean delivery delivered 10/11/2019   10/09/2019 - primary CS for failed IOL   Chest pain    resolved   Complication of anesthesia    states had to keep giving her anesthesia during EGD   Constipation    Depression    "I'm good"   Fibromyalgia    Finger mass, left 03/2017   middle finger   Gestational hypertension 09/23/2019   Guidelines for Antenatal Testing and Sonography  (with updated ICD-10 codes)  Updated  10/04/2019 with Dr. Noralee Space  INDICATION U/S 2 X week NST/AFI  or full BPP wkly DELIVERY Diabetes   A1 - good control - O24.410    A2 - good control - O24.419      A2  - poor control or poor compliance - O24.419, E11.65   (Macrosomia or polyhydramnios) **E11.65 is extra code for poor control**    A2/B - O24.   Headache    Hepatitis 07/18/2022   Hepatitis B   History of kidney stones     Hypertension    Infection    UTI   Irritable bowel syndrome (IBS)    Joint pain    Morbid obesity with body mass index (BMI) of 45.0 to 49.9 in adult [facility])    Obesity during pregnancy, antepartum 04/06/2019   Body mass index is 53.71 kg/m.  Recommendations [x]  Aspirin 81 mg daily after 12 weeks; discontinue after 36 weeks [ ]  Nutrition consult [ ]  Weight gain 11-20 lbs for singleton and 25-35 lbs for twin pregnancy (IOM guidelines) Higher class of obesity patients recommended to gain closer to lower limit  Weight loss is associated with adverse outcomes [ ]  Baseline and surveillance labs (pulled in fr   Plantar fasciitis, bilateral    resolved   Polycystic ovary syndrome    Pre-diabetes    no meds, diet controlled, diet not check blood sugar   Pregnancy induced hypertension    Shortness of breath    albuterol inhaler   Sleep apnea    no CPAP use   Supervision of normal first pregnancy 04/06/2019   [name]: [ x]Initial [ x]12 [ ] 20 [ ] 28 [ ] 32 [ ] 36 [ ] 38 [ ]   39 [ ] 40 Nursing Staff Provider Office Location [facility]  Dating   LMP Language  English  Anatomy US  Nml Flu Vaccine  Declined 07/28/19 Genetic Screen  NIPS: low risks   AFP:   negative  TDaP vaccine   08/25/19 Hgb A1C or  GTT Early A1C 5.8 Third trimester: GDM insulin Rhogam  NA   LAB RESULTS  Feeding Plan Breast Blood Type   Vertigo 2017    Patient Active Problem List   Diagnosis Date Noted   IUD check up 05/15/2021   Encounter for insertion of mirena IUD 04/01/2021   Levator spasm 11/21/2020   Galactorrhea 11/19/2020   Hx of migraine headaches 04/09/2020   Contraceptive management 11/22/2019   Positive RPR test 10/09/2019   HTN (hypertension) 09/23/2019   Migraine headache 07/12/2019   Alpha thalassemia trait    Vitamin D deficiency 09/16/2017   Prediabetes 09/16/2017   Depression 09/16/2017   Class 3 severe obesity with serious comorbidity and body mass index (BMI) of 50.0 to 59.9 in adult (HCC)  09/16/2017   PCOS (polycystic ovarian syndrome) 08/13/2017   Mass 05/06/2017   Vertigo 10/06/2016   Microcytic anemia 09/26/2016   Obesity hypoventilation syndrome (HCC) 08/28/2016   Benign paroxysmal positional vertigo due to bilateral vestibular disorder 08/28/2016   OSA (obstructive sleep apnea) 08/28/2016   Hypersomnia with sleep apnea 08/28/2016   Female hirsutism 08/28/2016   GERD (gastroesophageal reflux disease) 02/24/2015   Constipation 10/02/2014    Past Surgical History:  Procedure Laterality Date   CESAREAN SECTION N/A 10/09/2019   Procedure: CESAREAN SECTION;  Surgeon: Thackerville Bing, MD;  Location: [facility];  Service: Obstetrics;  Laterality: N/A;   COLONOSCOPY WITH PROPOFOL  10/07/2016   DILATION AND CURETTAGE OF UTERUS     ESOPHAGOGASTRODUODENOSCOPY  12/21/2015   EXCISION MASS UPPER EXTREMETIES Left 04/21/2017   Procedure: EXCISION MASS LEFT MIDDLE FINGER;  Surgeon: Cindee Salt, MD;  Location: [facility];  Service: Orthopedics;  Laterality: Left;   gastric sleeve  05/09/2022   IR FL GUIDED LOC OF NEEDLE/CATH TIP FOR SPINAL INJECTION RT  03/13/2020   KNEE ARTHROSCOPY WITH LATERAL MENISECTOMY Left 10/31/2022   Procedure: KNEE ARTHROSCOPY WITH PARTIAL LATERAL MENISECTOMY;  Surgeon: Yolonda Kida, MD;  Location: [facility];  Service: Orthopedics;  Laterality: Left;  75   KNEE ARTHROSCOPY WITH LATERAL RELEASE Left 10/31/2022   Procedure: KNEE ARTHROSCOPY WITH LATERAL RELEASE AND LIMITED SYNOVECTOMY;  Surgeon: Yolonda Kida, MD;  Location: [facility];  Service: Orthopedics;  Laterality: Left;  75   PHOTOCOAGULATION WITH LASER Left 12/13/2020   Procedure: RETINOPLEXY LEFT EYE;  Surgeon: Stephannie Li, MD;  Location: [facility] OR;  Service: Ophthalmology;  Laterality: Left;   SCLERAL BUCKLE Right 12/13/2020   Procedure: SCLERAL BUCKLE WITH CRYO;  Surgeon: Stephannie Li, MD;  Location: [facility];  Service: Ophthalmology;  Laterality: Right;    OB History      Gravida  1   Para  1   Term  1   Preterm  0   AB  0   Living  1      SAB  0   IAB  0   Ectopic  0   Multiple  0   Live Births  1            Home Medications    Prior to Admission medications   Medication Sig Start Date End Date Taking? Authorizing Provider  albuterol (VENTOLIN HFA) 108 (90 Base) MCG/ACT inhaler Inhale  2 puffs into the lungs every 6 (six) hours as needed for wheezing or shortness of breath. 08/19/22   [place], Betty G, MD  ALPRAZolam Prudy Feeler) 0.5 MG tablet Take 0.5 mg by mouth daily as needed for anxiety.    [provider]  APPLE CIDER VINEGAR PO Take 1 tablet by mouth daily.    [provider]  Blood Pressure Monitoring (BLOOD PRESSURE MON/AUTO/WRIST) DEVI Use to monitor blood pressure 09/24/21   Nafziger, Kandee Keen, NP  budesonide-formoterol (SYMBICORT) 160-4.5 MCG/ACT inhaler Inhale 2 puffs into the lungs 2 (two) times daily. 10/03/22   Omar Person, MD  CALCIUM PO Take 1 tablet by mouth daily.    [provider]  COLLAGEN PO Take 1 tablet by mouth daily.    [provider]  ferrous sulfate 325 (65 FE) MG tablet Take 1 tablet (325 mg total) by mouth every other day. Patient not taking: Reported on 10/21/2022 02/23/20 10/15/22  Shirline Frees, NP  fluconazole (DIFLUCAN) 150 MG tablet Take 1 tablet (150 mg total) by mouth daily. 12/24/22   Ndulue, Chiagoziem J, MD  fluticasone (FLONASE) 50 MCG/ACT nasal spray Place 2 sprays into both nostrils daily. 11/12/22   Nafziger, Kandee Keen, NP  hydrocortisone (ANUSOL-HC) 25 MG suppository Place 1 suppository (25 mg total) rectally 2 (two) times daily. 11/12/22   Nafziger, Kandee Keen, NP  levonorgestrel (MIRENA, 52 MG,) 20 MCG/DAY IUD 1 each by Intrauterine route once. 04/02/22   [provider]  loratadine (CLARITIN) 10 MG tablet Take 10 mg by mouth daily as needed for allergies.     [provider]  magic mouthwash w/lidocaine SOLN Take 5 mLs by mouth 3 (three) times daily as  needed (gargle and spit). Patient taking differently: Take 5 mLs by mouth 3 (three) times daily as needed for mouth pain. 07/29/22   Nafziger, Kandee Keen, NP  metroNIDAZOLE (FLAGYL) 500 MG tablet Take 1 tablet (500 mg total) by mouth 2 (two) times daily. 12/24/22   Ndulue, Chiagoziem J, MD  Multiple Vitamins-Minerals (BARIATRIC MULTIVITAMINS/IRON PO) Take 1 tablet by mouth daily.    [provider]  Multiple Vitamins-Minerals (HAIR SKIN NAILS PO) Take 1 tablet by mouth daily.    [provider]  naloxone [facility]) nasal spray 4 mg/0.1 mL Place 0.4 mg into the nose once. 09/09/22   [provider]  ondansetron (ZOFRAN) 4 MG tablet Take 1 tablet (4 mg total) by mouth every 8 (eight) hours as needed. 10/31/22   Yolonda Kida, MD  ondansetron (ZOFRAN-ODT) 8 MG disintegrating tablet Take 8 mg by mouth every 8 (eight) hours as needed for vomiting or nausea.    [provider]  oxyCODONE-acetaminophen (PERCOCET/ROXICET) 5-325 MG tablet Take 1 tablet by mouth 6 (six) times daily. 10/31/22   Yolonda Kida, MD  pantoprazole (PROTONIX) 40 MG tablet Take 1 tablet (40 mg total) by mouth daily. 06/11/21   Omar Person, MD  predniSONE (DELTASONE) 20 MG tablet Take 2 tablets (40 mg total) by mouth daily. Patient not taking: Reported on 01/13/2023 11/15/22   Achille Rich, PA-C  pregabalin (LYRICA) 75 MG capsule Take 75 mg by mouth at bedtime. 10/07/22   [provider]  tamsulosin (FLOMAX) 0.4 MG CAPS capsule Take 1 capsule (0.4 mg total) by mouth daily. 12/23/22   Ndulue, Chiagoziem J, MD  tiZANidine (ZANAFLEX) 4 MG tablet Take 4 mg by mouth 3 (three) times daily as needed for muscle spasms. 09/09/22   [provider]  topiramate (TOPAMAX) 50 MG tablet  Take 50 mg by mouth 2 (two) times daily. 03/21/21   [provider]    Family History Family History  Problem Relation Age of Onset   Diabetes Maternal Aunt    Hypertension Maternal Uncle     Depression Maternal Grandfather    Diabetes Maternal Grandfather    Hypertension Maternal Grandfather    Arthritis Mother    High blood pressure Mother    Depression Mother    Sleep apnea Mother    Obesity Mother    Diabetes Mother    Hypertension Mother    Kidney failure Mother    Sleep apnea Father    Obesity Father    Healthy Daughter     Social History Social History   Tobacco Use   Smoking status: Never   Smokeless tobacco: Never  Vaping Use   Vaping Use: Never used  Substance Use Topics   Alcohol use: Not Currently    Comment: social    Drug use: No     Allergies   Bee venom, Hydrocodone-acetaminophen, Peach flavor, Pollen extract, Remdesivir, Latex, Cortisone, Cortizone-10 [hydrocortisone], and Toradol [ketorolac tromethamine]   Review of Systems Review of Systems  Gastrointestinal:  Positive for abdominal pain.  Genitourinary:  Positive for menstrual problem.     Physical Exam Triage Vital Signs ED Triage Vitals  Enc Vitals Group     BP 02/25/23 1003 114/75     Pulse Rate 02/25/23 1003 83     Resp 02/25/23 1003 18     Temp 02/25/23 1003 97.8 F (36.6 C)     Temp Source 02/25/23 1003 Oral     SpO2 02/25/23 1003 95 %     Weight --      Height --      Head Circumference --      Peak Flow --      Pain Score 02/25/23 1002 8     Pain Loc --      Pain Edu? --      Excl. in GC? --    No data found.  Updated Vital Signs BP 114/75 (BP Location: Right Arm)   Pulse 83   Temp 97.8 F (36.6 C) (Oral)   Resp 18   LMP 02/08/2023   SpO2 95%   Visual Acuity Right Eye Distance:   Left Eye Distance:   Bilateral Distance:    Right Eye Near:   Left Eye Near:    Bilateral Near:     Physical Exam Vitals and nursing note reviewed.  Constitutional:      General: She is not in acute distress.    Appearance: Normal appearance. She is obese. She is not ill-appearing.  HENT:     Head: Normocephalic and atraumatic.  Eyes:     Pupils: Pupils are  equal, round, and reactive to light.  Cardiovascular:     Rate and Rhythm: Normal rate.  Pulmonary:     Effort: Pulmonary effort is normal.  Abdominal:     Palpations: Abdomen is soft.     Tenderness: There is generalized abdominal tenderness. Negative signs include Rovsing's sign and McBurney's sign.     Comments: exam somewhat limited due to body habitus  Skin:    General: Skin is warm and dry.  Neurological:     General: No focal deficit present.     Mental Status: She is alert and oriented to person, place, and time.  Psychiatric:        Mood and Affect: Mood normal.  Behavior: Behavior normal.      UC Treatments / Results  Labs (all labs ordered are listed, but only abnormal results are displayed) Labs Reviewed  POCT URINALYSIS DIP (MANUAL ENTRY) - Abnormal; Notable for the following components:      Result Value   Clarity, UA cloudy (*)    All other components within normal limits  BETA HCG QUANT (REF LAB)  POCT URINE PREGNANCY   Comprehensive metabolic panel Order: [id number] Status: Final result     Visible to patient: Yes (seen)     Next appt: None   0 Result Notes          Component Ref Range & Units 3 mo ago (11/15/22) 4 mo ago (10/27/22) 5 mo ago (09/05/22) 9 mo ago (05/25/22) 10 mo ago (05/01/22) 10 mo ago (04/24/22) 1 yr ago (12/20/21)  Sodium 135 - 145 mmol/L 140 138 140 140 140 143 140 R  Potassium 3.5 - 5.1 mmol/L 3.7 4.1 3.7 3.8 4.1 3.7 4.2 R  Chloride 98 - 111 mmol/L 103 105 105 102 108 109 104 R  CO2 22 - 32 mmol/L 28 25 25 25 25 25 29  R  Glucose, Bld 70 - 99 mg/dL 96 92 CM 027 High  CM 83 CM 98 CM 94 CM 100 High   Comment: Glucose reference range applies only to samples taken after fasting for at least 8 hours.  BUN 6 - 20 mg/dL 7 10 10  <5 Low  9 12 11  R  Creatinine, Ser 0.44 - 1.00 mg/dL 2.53 6.64 4.03 4.74 2.59 0.71 0.63 R  Calcium 8.9 - 10.3 mg/dL 9.3 8.9 9.1 9.6 8.9 9.4 9.1 R  Total Protein 6.5 - 8.1 g/dL 8.0 7.8 7.5 8.1  8.5  High  7.7 R  Albumin 3.5 - 5.0 g/dL 4.2 3.9 4.2 4.3  4.2 4.0 R  AST 15 - 41 U/L 25 17 13  Low  38  22 15 R  ALT 0 - 44 U/L 22 18 18  49 High   21 16 R  Alkaline Phosphatase 38 - 126 U/L 84 75 75 65  82 76 R  Total Bilirubin 0.3 - 1.2 mg/dL 0.9 0.7 0.5 0.8  0.8 0.5 R  GFR, Estimated >60 mL/min >60 >60 CM >60 CM >60 CM >60 CM >60 CM   Comment: (NOTE) Calculated using the CKD-EPI Creatinine Equation (2021)  Anion gap 5 - 15 9 8  CM 10 CM 13 CM 7 CM 9 CM   Comment: Performed at [facility], [address], [place], [place] [address]  Resulting Agency [facility]         Specimen Collected: 11/15/22 11:18 Last Resulted: 11/15/22 12:01     View All Conversations on this Encounter      CM=Additional comments  R=Reference range differs from displayed range      Result Care Coordination   Patient Communication   Add Comments   Seen Back to Top      Other Results from 11/15/2022   Contains abnormal data Wet prep, genital         Component Ref Range & Units 3 mo ago (11/15/22) 3 yr ago (03/27/19) 4 yr ago (02/25/19) 4 yr ago (02/18/19)  Yeast Wet Prep HPF POC NONE SEEN NONE SEEN PRESENT Abnormal  NONE SEEN NONE SEEN  Trich, Wet Prep NONE SEEN NONE SEEN NONE SEEN NONE SEEN NONE  SEEN  Clue Cells Wet Prep HPF POC NONE SEEN PRESENT Abnormal  NONE SEEN PRESENT Abnormal  PRESENT Abnormal   WBC, Wet Prep HPF POC <10 <10 MANY Abnormal  R FEW Abnormal  R, CM MODERATE Abnormal  R  Sperm NONE SEEN NONE SEEN CM NONE SEEN CM NONE SEEN CM  Comment: Performed at [facility], [address] [place], [place] [address]  Resulting Agency [facility]        EKG   Radiology No results found.  Procedures Procedures (including critical care time)  Medications Ordered in UC Medications - No data to display  Initial Impression /  Assessment and Plan / UC Course  I have reviewed the triage vital signs and the nursing notes.  Pertinent labs & imaging results that were available during my care of the patient were reviewed by me and considered in my medical decision making (see chart for details).    Recent labs and progress notes reviewed  STD testing as ordered Reviewed exam and symptoms with patient.  Discussed irregular menses after birth control cessation is expected.  Also discussed symptoms more consistent with menstrual cramping.  No red flags on exam.  Urine and hCG were negative.  Patient requested blood work for hCG testing.  This was done and will contact if positive Patient to continue Tylenol and her oxycodone if needed Heating pad to abdomen as needed Advise follow-up with her GYN at her scheduled appointment next week She was instructed to go to the ER for any worsening symptoms that occur prior to her seeing her GYN and she verbalized understanding Final Clinical Impressions(s) / UC Diagnoses   Final diagnoses:  Menstrual cramps  Spotting between menses  Screening examination for STD (sexually transmitted disease)     Discharge Instructions      Clinic will contact you with results of the blood work done today if positive Please follow-up with your gynecologist at your scheduled appointment next week Please go to the ER for any worsening symptoms that occur prior to seeing your gynecologist    ED Prescriptions   None    PDMP not reviewed this encounter.   Radford Pax, NP 02/25/23 [phone]    Radford Pax, NP 02/25/23 [phone]    Radford Pax, NP 02/25/23 [phone]

## 2023-02-25 NOTE — Discharge Instructions (Signed)
Clinic will contact you with results of the blood work done today if positive Please follow-up with your gynecologist at your scheduled appointment next week Please go to the ER for any worsening symptoms that occur prior to seeing your gynecologist

## 2023-02-26 LAB — CERVICOVAGINAL ANCILLARY ONLY
Bacterial Vaginitis (gardnerella): NEGATIVE
Candida Glabrata: NEGATIVE
Candida Vaginitis: NEGATIVE
Chlamydia: NEGATIVE
Comment: NEGATIVE
Comment: NEGATIVE
Comment: NEGATIVE
Comment: NEGATIVE
Comment: NEGATIVE
Comment: NORMAL
Neisseria Gonorrhea: NEGATIVE
Trichomonas: POSITIVE — AB

## 2023-02-26 LAB — BETA HCG QUANT (REF LAB): hCG Quant: <1 m[IU]/mL

## 2023-03-02 ENCOUNTER — Encounter: Payer: Self-pay | Admitting: Adult Health

## 2023-03-02 DIAGNOSIS — F439 Reaction to severe stress, unspecified: Secondary | ICD-10-CM | POA: Diagnosis not present

## 2023-03-02 DIAGNOSIS — M1712 Unilateral primary osteoarthritis, left knee: Secondary | ICD-10-CM | POA: Diagnosis not present

## 2023-03-03 ENCOUNTER — Telehealth (HOSPITAL_COMMUNITY): Payer: Self-pay | Admitting: Emergency Medicine

## 2023-03-03 MED ORDER — METRONIDAZOLE 500 MG PO TABS
500.0000 mg | ORAL_TABLET | Freq: Two times a day (BID) | ORAL | 0 refills | Status: DC
Start: 2023-03-03 — End: 2023-04-06

## 2023-03-03 NOTE — Telephone Encounter (Signed)
Pt is calling and would like kendra to return her call. Pt was offer an appt

## 2023-03-03 NOTE — Telephone Encounter (Signed)
Spoke to pt and advised that a Rx was sent in today. Pt verbalized understanding and will check with the pharmacy.

## 2023-03-06 NOTE — Telephone Encounter (Signed)
Pt notified that per [facility] we cannot prescribe this medication. Pt will need to call the provider who prescribed it for a refill. Pt verbalized understanding.

## 2023-03-06 NOTE — Telephone Encounter (Signed)
Pt is calling back she was unable to keep medication down and would like a refill . Pt does not know the know of medication. Pt would like kendra to return her call

## 2023-03-08 ENCOUNTER — Emergency Department (HOSPITAL_BASED_OUTPATIENT_CLINIC_OR_DEPARTMENT_OTHER)
Admission: EM | Admit: 2023-03-08 | Discharge: 2023-03-08 | Disposition: A | Discharge status: Home / Self Care | Payer: Medicaid Other | Attending: Emergency Medicine | Admitting: Emergency Medicine

## 2023-03-08 ENCOUNTER — Other Ambulatory Visit: Payer: Self-pay

## 2023-03-08 ENCOUNTER — Emergency Department (HOSPITAL_BASED_OUTPATIENT_CLINIC_OR_DEPARTMENT_OTHER): Payer: Medicaid Other

## 2023-03-08 ENCOUNTER — Encounter (HOSPITAL_BASED_OUTPATIENT_CLINIC_OR_DEPARTMENT_OTHER): Payer: Self-pay

## 2023-03-08 DIAGNOSIS — N939 Abnormal uterine and vaginal bleeding, unspecified: Secondary | ICD-10-CM | POA: Diagnosis not present

## 2023-03-08 DIAGNOSIS — Z91040 Latex allergy status: Secondary | ICD-10-CM | POA: Insufficient documentation

## 2023-03-08 DIAGNOSIS — R42 Dizziness and giddiness: Secondary | ICD-10-CM | POA: Insufficient documentation

## 2023-03-08 LAB — HCG, QUANTITATIVE, PREGNANCY: hCG, Beta Chain, Quant, S: <1 m[IU]/mL (ref <5)

## 2023-03-08 LAB — CBC
HCT: 39.6 % (ref 36.0–46.0)
Hemoglobin: 12.7 g/dL (ref 12.0–15.0)
MCH: 25.2 pg — ABNORMAL LOW (ref 26.0–34.0)
MCHC: 32.1 g/dL (ref 30.0–36.0)
MCV: 78.6 fL — ABNORMAL LOW (ref 80.0–100.0)
Platelets: 218 10*3/uL (ref 150–400)
RBC: 5.04 MIL/uL (ref 3.87–5.11)
RDW: 13.6 % (ref 11.5–15.5)
WBC: 6.8 10*3/uL (ref 4.0–10.5)
nRBC: 0 % (ref 0.0–0.2)

## 2023-03-08 LAB — URINALYSIS, ROUTINE W REFLEX MICROSCOPIC
Bacteria, UA: NONE SEEN
Bilirubin Urine: NEGATIVE
Glucose, UA: NEGATIVE mg/dL
Ketones, ur: NEGATIVE mg/dL
Leukocytes,Ua: NEGATIVE
Nitrite: NEGATIVE
Protein, ur: NEGATIVE mg/dL
RBC / HPF: >50 RBC/hpf (ref 0–5)
Specific Gravity, Urine: 1.018 (ref 1.005–1.030)
pH: 7.5 (ref 5.0–8.0)

## 2023-03-08 LAB — COMPREHENSIVE METABOLIC PANEL
ALT: 9 U/L (ref 0–44)
AST: 12 U/L — ABNORMAL LOW (ref 15–41)
Albumin: 4.1 g/dL (ref 3.5–5.0)
Alkaline Phosphatase: 60 U/L (ref 38–126)
Anion gap: 8 (ref 5–15)
BUN: 12 mg/dL (ref 6–20)
CO2: 26 mmol/L (ref 22–32)
Calcium: 9.2 mg/dL (ref 8.9–10.3)
Chloride: 104 mmol/L (ref 98–111)
Creatinine, Ser: 0.54 mg/dL (ref 0.44–1.00)
GFR, Estimated: >60 mL/min (ref >60)
Glucose, Bld: 107 mg/dL — ABNORMAL HIGH (ref 70–99)
Potassium: 3.6 mmol/L (ref 3.5–5.1)
Sodium: 138 mmol/L (ref 135–145)
Total Bilirubin: 0.6 mg/dL (ref 0.3–1.2)
Total Protein: 6.8 g/dL (ref 6.5–8.1)

## 2023-03-08 LAB — LIPASE, BLOOD: Lipase: 15 U/L (ref 11–51)

## 2023-03-08 LAB — WET PREP, GENITAL
Clue Cells Wet Prep HPF POC: NONE SEEN
Sperm: NONE SEEN
Trich, Wet Prep: NONE SEEN
WBC, Wet Prep HPF POC: <10 (ref <10)
Yeast Wet Prep HPF POC: NONE SEEN

## 2023-03-08 LAB — HIV ANTIBODY (ROUTINE TESTING W REFLEX): HIV Screen 4th Generation wRfx: NONREACTIVE

## 2023-03-08 LAB — PREGNANCY, URINE: Preg Test, Ur: NEGATIVE

## 2023-03-08 MED ORDER — ACETAMINOPHEN 325 MG PO TABS
650.0000 mg | ORAL_TABLET | Freq: Four times a day (QID) | ORAL | Status: DC | PRN
  Filled 2023-03-08: qty 2

## 2023-03-08 NOTE — ED Triage Notes (Signed)
Patient here POV from Home.  Endorses Vaginal Bleeding for 2 Days. Some Left LLQ and Pelvic Pain. Some N/V. No Diarrhea.   LMP: 4/48/24.   NAD Noted during Triage. A&O4. GCS 15. Ambulatory.

## 2023-03-08 NOTE — Discharge Instructions (Addendum)
It was a pleasure caring for you today. Blood work was without concern for pregnancy or anemia. Ultrasound was not able to visualize ovaries. Trich was negative. Other STD panels are pending. I recommend following up with your GYN doctor within the next few days to check in on abnormal uterine bleeding.  Seek emergency care if experiencing new or worsening symptoms.

## 2023-03-08 NOTE — ED Provider Notes (Signed)
[place] EMERGENCY DEPARTMENT AT [facility] Provider Note   CSN: [id number] Arrival date & time: 03/08/23  1328     History  Chief Complaint  Patient presents with   Vaginal Bleeding    Maureen Lewis is a [id number] 27 y.o. female who presents to ED complaining of vaginal bleeding x2 days. LMP 02/15/2023.  Patient endorsing 6-7 pads daily. Complaining of lightheadedness and LLQ pain today. Patient with irregular periods 1 year ago before having IUD. Patient with normal periods while on IUD. IUD was removed last month and patient has had 1 period since.  Patient also endorsing recent trich infection that she took antibiotics for last week. Denies vaginal discharge. Endorses 2 sexual partners over the past 12 months. Not using condoms d/t latex allergies.  Denies dyspareunia, fever, chills, chest pain, dyspnea, vaginal discharge.   Vaginal Bleeding      Home Medications Prior to Admission medications   Medication Sig Start Date End Date Taking? Authorizing Provider  albuterol (VENTOLIN HFA) 108 (90 Base) MCG/ACT inhaler Inhale 2 puffs into the lungs every 6 (six) hours as needed for wheezing or shortness of breath. 08/19/22   [place], Betty G, MD  ALPRAZolam Prudy Feeler) 0.5 MG tablet Take 0.5 mg by mouth daily as needed for anxiety.    [provider]  APPLE CIDER VINEGAR PO Take 1 tablet by mouth daily.    [provider]  Blood Pressure Monitoring (BLOOD PRESSURE MON/AUTO/WRIST) DEVI Use to monitor blood pressure 09/24/21   Nafziger, Kandee Keen, NP  budesonide-formoterol (SYMBICORT) 160-4.5 MCG/ACT inhaler Inhale 2 puffs into the lungs 2 (two) times daily. 10/03/22   Omar Person, MD  CALCIUM PO Take 1 tablet by mouth daily.    [provider]  COLLAGEN PO Take 1 tablet by mouth daily.    [provider]  ferrous sulfate 325 (65 FE) MG tablet Take 1 tablet (325 mg total) by mouth every other day. Patient not taking: Reported on  10/21/2022 02/23/20 10/15/22  Shirline Frees, NP  fluconazole (DIFLUCAN) 150 MG tablet Take 1 tablet (150 mg total) by mouth daily. 12/24/22   Ndulue, Chiagoziem J, MD  fluticasone (FLONASE) 50 MCG/ACT nasal spray Place 2 sprays into both nostrils daily. 11/12/22   Nafziger, Kandee Keen, NP  hydrocortisone (ANUSOL-HC) 25 MG suppository Place 1 suppository (25 mg total) rectally 2 (two) times daily. 11/12/22   Nafziger, Kandee Keen, NP  levonorgestrel (MIRENA, 52 MG,) 20 MCG/DAY IUD 1 each by Intrauterine route once. 04/02/22   [provider]  loratadine (CLARITIN) 10 MG tablet Take 10 mg by mouth daily as needed for allergies.     [provider]  magic mouthwash w/lidocaine SOLN Take 5 mLs by mouth 3 (three) times daily as needed (gargle and spit). Patient taking differently: Take 5 mLs by mouth 3 (three) times daily as needed for mouth pain. 07/29/22   Nafziger, Kandee Keen, NP  metroNIDAZOLE (FLAGYL) 500 MG tablet Take 1 tablet (500 mg total) by mouth 2 (two) times daily. 03/03/23   LampteyBritta Mccreedy, MD  Multiple Vitamins-Minerals (BARIATRIC MULTIVITAMINS/IRON PO) Take 1 tablet by mouth daily.    [provider]  Multiple Vitamins-Minerals (HAIR SKIN NAILS PO) Take 1 tablet by mouth daily.    [provider]  naloxone [facility]) nasal spray 4 mg/0.1 mL Place 0.4 mg into the nose once. 09/09/22   [provider]  ondansetron (ZOFRAN) 4 MG tablet Take 1 tablet (4 mg total) by mouth every 8 (eight) hours as  needed. 10/31/22   Yolonda Kida, MD  ondansetron (ZOFRAN-ODT) 8 MG disintegrating tablet Take 8 mg by mouth every 8 (eight) hours as needed for vomiting or nausea.    [provider]  oxyCODONE-acetaminophen (PERCOCET/ROXICET) 5-325 MG tablet Take 1 tablet by mouth 6 (six) times daily. 10/31/22   Yolonda Kida, MD  pantoprazole (PROTONIX) 40 MG tablet Take 1 tablet (40 mg total) by mouth daily. 06/11/21   Omar Person, MD  predniSONE (DELTASONE) 20 MG  tablet Take 2 tablets (40 mg total) by mouth daily. Patient not taking: Reported on 01/13/2023 11/15/22   Achille Rich, PA-C  pregabalin (LYRICA) 75 MG capsule Take 75 mg by mouth at bedtime. 10/07/22   [provider]  tamsulosin (FLOMAX) 0.4 MG CAPS capsule Take 1 capsule (0.4 mg total) by mouth daily. 12/23/22   Ndulue, Chiagoziem J, MD  tiZANidine (ZANAFLEX) 4 MG tablet Take 4 mg by mouth 3 (three) times daily as needed for muscle spasms. 09/09/22   [provider]  topiramate (TOPAMAX) 50 MG tablet Take 50 mg by mouth 2 (two) times daily. 03/21/21   [provider]      Allergies    Bee venom, Hydrocodone-acetaminophen, Peach flavor, Pollen extract, Remdesivir, Latex, Cortisone, Cortizone-10 [hydrocortisone], and Toradol [ketorolac tromethamine]    Review of Systems   Review of Systems  Genitourinary:  Positive for vaginal bleeding.    Physical Exam Updated Vital Signs BP 134/74 (BP Location: Right Arm)   Pulse 90   Temp 98 F (36.7 C)   Resp 16   Ht 6\' 3"  (1.905 m)   Wt (!) 176.4 kg   LMP 02/08/2023   SpO2 98%   BMI 48.62 kg/m  Physical Exam Vitals and nursing note reviewed. Exam conducted with a chaperone present.  Constitutional:      General: She is not in acute distress.    Appearance: She is not ill-appearing, toxic-appearing or diaphoretic.  HENT:     Head: Normocephalic and atraumatic.     Mouth/Throat:     Mouth: Mucous membranes are moist.  Eyes:     General: No scleral icterus.       Right eye: No discharge.        Left eye: No discharge.     Conjunctiva/sclera: Conjunctivae normal.  Cardiovascular:     Rate and Rhythm: Normal rate and regular rhythm.     Pulses: Normal pulses.     Heart sounds: Normal heart sounds. No murmur heard. Pulmonary:     Effort: Pulmonary effort is normal. No respiratory distress.     Breath sounds: Normal breath sounds. No wheezing, rhonchi or rales.  Abdominal:     Palpations: Abdomen is soft.      Comments: Tenderness with deep palpation of lower quadrants  Genitourinary:    Comments: PELVIC EXAM (chaperone Diplomatic Services operational officer) Exterior: no skin changes or lesions Vaginal wall: blood present. No pulsatile/arterial bleeding appreciated. No lacerations or lesions/ulcerations. Cervix: closed without lesions or ulcerations. Musculoskeletal:     Right lower leg: No edema.     Left lower leg: No edema.  Skin:    General: Skin is warm and dry.     Findings: No rash.  Neurological:     General: No focal deficit present.     Mental Status: She is alert. Mental status is at baseline.  Psychiatric:        Mood and Affect: Mood normal.     ED Results / Procedures / Treatments  Labs (all labs ordered are listed, but only abnormal results are displayed) Labs Reviewed  COMPREHENSIVE METABOLIC PANEL - Abnormal; Notable for the following components:      Result Value   Glucose, Bld 107 (*)    AST 12 (*)    All other components within normal limits  CBC - Abnormal; Notable for the following components:   MCV 78.6 (*)    MCH 25.2 (*)    All other components within normal limits  URINALYSIS, ROUTINE W REFLEX MICROSCOPIC - Abnormal; Notable for the following components:   Hgb urine dipstick MODERATE (*)    All other components within normal limits  WET PREP, GENITAL  LIPASE, BLOOD  PREGNANCY, URINE  HCG, QUANTITATIVE, PREGNANCY  HIV ANTIBODY (ROUTINE TESTING W REFLEX)  GC/CHLAMYDIA PROBE AMP () NOT AT [facility]    EKG None  Radiology US PELVIS TRANSVAGINAL NON-OB (TV ONLY)  Result Date: 03/08/2023 CLINICAL DATA:  Vaginal bleeding.  History of PCOS EXAM: ULTRASOUND PELVIS TRANSVAGINAL TECHNIQUE: Transvaginal ultrasound examination of the pelvis was performed including evaluation of the uterus, ovaries, adnexal regions, and pelvic cul-de-sac. COMPARISON:  CT 11/15/2022, ultrasound 09/24/2020 FINDINGS: Uterus Measurements: 9.8 x 4.8 x 5.3 cm = volume: 130 mL. No fibroids or other mass  visualized. Endometrium Thickness: 7 mm. No focal abnormality visualized. Minimal fluid within the endocervical canal. Right ovary Not visualized. Left ovary Not visualized. Other findings:  No significant free fluid. IMPRESSION: 1. Normal sonographic appearance of the uterus. 2. Minimal fluid within the endocervical canal. 3. Nonvisualization of the bilateral ovaries, obscured by shadowing bowel gas. Electronically Signed   By: Duanne Guess D.O.   On: 03/08/2023 17:52    Procedures Procedures    Medications Ordered in ED Medications  acetaminophen (TYLENOL) tablet 650 mg (has no administration in time range)    ED Course/ Medical Decision Making/ A&P Clinical Course as of 03/08/23 1833  Sun Mar 08, 2023  1544 Preg Test, Ur: NEGATIVE [SM]    Clinical Course User Index [SM] Dorthy Cooler, PA-C                             Medical Decision Making Amount and/or Complexity of Data Reviewed Labs: ordered. Decision-making details documented in ED Course. Radiology: ordered.   This patient presents to the ED for concern of vaginal bleeding, this involves an extensive number of treatment options, and is a complaint that carries with it a high risk of complications and morbidity.  The differential diagnosis includes ectopic pregnancy, pregnancy termination, placental abruption, placenta previa, uterine rupture, postpartum hemorrhage, acute heavy menstrual bleeding, ruptured ovarian cyst, foreign body   Co morbidities that complicate the patient evaluation  none   Lab Tests:  I Ordered, and personally interpreted labs.  The pertinent results include:  - CBC: No concern for anemia or leukocytosis - UA: no concern for infection - UPT: negative - STI Testing: pending - Wet-prep: negative - Lipase: within normal limits - HIV: pending   Imaging Studies ordered:  I ordered imaging studies including  -Transvaginal US:  1.Normal sonographic appearance of the uterus. 2.  Minimal fluid within the endocervical canal. 3. Nonvisualization of the bilateral ovaries, obscured by shadowing bowel gas. I independently visualized and interpreted imaging I agree with the radiologist interpretation     Problem List / ED Course / Critical interventions / Medication management  Patient presenting for abnormal uterine bleeding and abdominal pain. Pelvic exam with blood  present - no pulsatile/arterial bleeding. Bimanual exam without adnexal tenderness or ovarian mass appreciated. CBC without concern for anemia. Pregnancy test negative. Patient reports irregular periods before IUD - I believe her abnormal uterine bleeding today is a result of her irregular periods and IUD removal 1 month ago. Patient also reporting that lower abdominal pain is similar to her symptoms when she had ruptured ovarian cysts in the past. [name] not able to visualize ovaries today. Patient afebrile with stable vitals. Patient stating that she is ready for discharge. I recommended patient follow up with GYN to check in that uterine bleeding is resolving and to check in on the results of patient's STD panel. Patient endorses trich diagnosis last week that she has been taking ABX for. Wet prep negative. Patient denies pain with sex and vaginal discharge before bleeding started and agreed that she would prefer to follow up with STD panel with her GYN this week to see if she needs further ABX.  I have reviewed the patients home medicines and have made adjustments as needed  Ddx: these are considered less likely due to history of present illness and physical exam findings - ectopic pregnancy/pregnancy termination/placental abruption: UPT negative - foreign body: not visualized on pelvic exam - Ovarian torsion: no adnexal tenderness/fullness appreciated on bimanual/pelvic exam; patient without severe abdominal pain or vomiting in ED   Social Determinants of Health:  none          Final Clinical  Impression(s) / ED Diagnoses Final diagnoses:  Abnormal uterine bleeding    Rx / DC Orders ED Discharge Orders     None         Dorthy Cooler, [place] 03/08/23 1833    Terald Sleeper, MD 03/08/23 2010

## 2023-03-09 LAB — GC/CHLAMYDIA PROBE AMP (~~LOC~~) NOT AT ARMC
Chlamydia: NEGATIVE
Comment: NEGATIVE
Comment: NORMAL
Neisseria Gonorrhea: NEGATIVE

## 2023-03-10 ENCOUNTER — Telehealth: Payer: Self-pay | Admitting: *Deleted

## 2023-03-10 NOTE — Transitions of Care (Post Inpatient/ED Visit) (Signed)
   03/10/2023  Name: Maureen Lewis MRN: [id number] DOB: February 08, 1996  Today's TOC FU Call Status: Unsuccessful Call (1st Attempt) Date: 03/10/23  Attempted to reach the patient regarding the most recent Inpatient/ED visit.  Follow Up Plan: Additional outreach attempts will be made to reach the patient to complete the Transitions of Care (Post Inpatient/ED visit) call.   Estanislado Emms RN, BSN [place]  Managed [facility] RN Care Coordinator [phone]

## 2023-03-12 DIAGNOSIS — F439 Reaction to severe stress, unspecified: Secondary | ICD-10-CM | POA: Diagnosis not present

## 2023-03-23 DIAGNOSIS — K449 Diaphragmatic hernia without obstruction or gangrene: Secondary | ICD-10-CM | POA: Diagnosis not present

## 2023-03-23 DIAGNOSIS — Z903 Acquired absence of stomach [part of]: Secondary | ICD-10-CM | POA: Diagnosis not present

## 2023-03-23 DIAGNOSIS — Z6841 Body Mass Index (BMI) 40.0 and over, adult: Secondary | ICD-10-CM | POA: Diagnosis not present

## 2023-03-23 DIAGNOSIS — F439 Reaction to severe stress, unspecified: Secondary | ICD-10-CM | POA: Diagnosis not present

## 2023-03-25 DIAGNOSIS — N915 Oligomenorrhea, unspecified: Secondary | ICD-10-CM | POA: Diagnosis not present

## 2023-03-25 DIAGNOSIS — R7303 Prediabetes: Secondary | ICD-10-CM | POA: Diagnosis not present

## 2023-03-25 DIAGNOSIS — R635 Abnormal weight gain: Secondary | ICD-10-CM | POA: Diagnosis not present

## 2023-03-25 DIAGNOSIS — Z79899 Other long term (current) drug therapy: Secondary | ICD-10-CM | POA: Diagnosis not present

## 2023-03-25 DIAGNOSIS — Z1159 Encounter for screening for other viral diseases: Secondary | ICD-10-CM | POA: Diagnosis not present

## 2023-03-25 DIAGNOSIS — M129 Arthropathy, unspecified: Secondary | ICD-10-CM | POA: Diagnosis not present

## 2023-03-25 DIAGNOSIS — M545 Low back pain, unspecified: Secondary | ICD-10-CM | POA: Diagnosis not present

## 2023-03-25 DIAGNOSIS — M546 Pain in thoracic spine: Secondary | ICD-10-CM | POA: Diagnosis not present

## 2023-03-25 DIAGNOSIS — Z6841 Body Mass Index (BMI) 40.0 and over, adult: Secondary | ICD-10-CM | POA: Diagnosis not present

## 2023-03-25 DIAGNOSIS — M25571 Pain in right ankle and joints of right foot: Secondary | ICD-10-CM | POA: Diagnosis not present

## 2023-03-25 DIAGNOSIS — E559 Vitamin D deficiency, unspecified: Secondary | ICD-10-CM | POA: Diagnosis not present

## 2023-03-27 DIAGNOSIS — Z79899 Other long term (current) drug therapy: Secondary | ICD-10-CM | POA: Diagnosis not present

## 2023-03-30 DIAGNOSIS — Z9889 Other specified postprocedural states: Secondary | ICD-10-CM | POA: Diagnosis not present

## 2023-03-31 ENCOUNTER — Ambulatory Visit: Payer: Medicaid Other | Admitting: Adult Health

## 2023-03-31 ENCOUNTER — Other Ambulatory Visit (HOSPITAL_COMMUNITY)
Admission: RE | Admit: 2023-03-31 | Discharge: 2023-03-31 | Disposition: A | Discharge status: Home / Self Care | Payer: Medicaid Other | Source: Ambulatory Visit | Attending: Adult Health | Admitting: Adult Health

## 2023-03-31 ENCOUNTER — Encounter: Payer: Self-pay | Admitting: Adult Health

## 2023-03-31 VITALS — BP 110/88 | HR 85 | Temp 99.0°F | Ht 75.0 in | Wt 390.0 lb

## 2023-03-31 DIAGNOSIS — R5383 Other fatigue: Secondary | ICD-10-CM | POA: Diagnosis not present

## 2023-03-31 DIAGNOSIS — Z113 Encounter for screening for infections with a predominantly sexual mode of transmission: Secondary | ICD-10-CM

## 2023-03-31 DIAGNOSIS — R110 Nausea: Secondary | ICD-10-CM

## 2023-03-31 DIAGNOSIS — N939 Abnormal uterine and vaginal bleeding, unspecified: Secondary | ICD-10-CM

## 2023-03-31 LAB — CBC WITH DIFFERENTIAL/PLATELET
Basophils Absolute: 0 10*3/uL (ref 0.0–0.1)
Basophils Relative: 0.3 % (ref 0.0–3.0)
Eosinophils Absolute: 0 10*3/uL (ref 0.0–0.7)
Eosinophils Relative: 0.5 % (ref 0.0–5.0)
HCT: 39.6 % (ref 36.0–46.0)
Hemoglobin: 12.7 g/dL (ref 12.0–15.0)
Lymphocytes Relative: 33.6 % (ref 12.0–46.0)
Lymphs Abs: 2 10*3/uL (ref 0.7–4.0)
MCHC: 32 g/dL (ref 30.0–36.0)
MCV: 77.4 fl — ABNORMAL LOW (ref 78.0–100.0)
Monocytes Absolute: 0.4 10*3/uL (ref 0.1–1.0)
Monocytes Relative: 6.5 % (ref 3.0–12.0)
Neutro Abs: 3.6 10*3/uL (ref 1.4–7.7)
Neutrophils Relative %: 59.1 % (ref 43.0–77.0)
Platelets: 209 10*3/uL (ref 150.0–400.0)
RBC: 5.12 Mil/uL — ABNORMAL HIGH (ref 3.87–5.11)
RDW: 14.5 % (ref 11.5–15.5)
WBC: 6 10*3/uL (ref 4.0–10.5)

## 2023-03-31 LAB — IBC + FERRITIN
Ferritin: 44.7 ng/mL (ref 10.0–291.0)
Iron: 60 ug/dL (ref 42–145)
Saturation Ratios: 15.5 % — ABNORMAL LOW (ref 20.0–50.0)
TIBC: 387.8 ug/dL (ref 250.0–450.0)
Transferrin: 277 mg/dL (ref 212.0–360.0)

## 2023-03-31 LAB — TSH: TSH: 1.35 u[IU]/mL (ref 0.35–5.50)

## 2023-03-31 NOTE — Addendum Note (Signed)
Addended by: Waymon Amato R on: 03/31/2023 08:17 AM   Modules accepted: Orders

## 2023-03-31 NOTE — Progress Notes (Signed)
Subjective:    Patient ID: Maureen Lewis, female    DOB: Dec 17, 1995, 27 y.o.   MRN: [id number]  HPI 27 year old female who  has a past medical history of Abnormal uterine bleeding, Acid reflux, Alpha thalassemia trait, Amenorrhea, Anemia, Asthma, Back pain, Cesarean delivery delivered (10/11/2019), Chest pain, Complication of anesthesia, Constipation, Depression, Fibromyalgia, Finger mass, left (03/2017), Gestational hypertension (09/23/2019), Headache, Hepatitis (07/18/2022), History of kidney stones, Hypertension, Infection, Irritable bowel syndrome (IBS), Joint pain, Morbid obesity with body mass index (BMI) of 45.0 to 49.9 in adult [facility]), Obesity during pregnancy, antepartum (04/06/2019), Plantar fasciitis, bilateral, Polycystic ovary syndrome, Pre-diabetes, Pregnancy induced hypertension, Shortness of breath, Sleep apnea, Supervision of normal first pregnancy (04/06/2019), and Vertigo (2017).  She presents to the office today for abnormal uterine bleeding/fatigue/nausea. She was seen in the ER on 03/08/2023 for vaginal bleeding x 2 days. She reported having to use 7-10 pads daily. She had her IUD  removed in April and she started her period a week and half later and this finally resolved about a week ago. She has been feeling fatigued, nauseated and having abdominal pain since. Her CBC last month did not show anemia.   She would also like to have an STD panel done for Trichomonas and G/C   Review of Systems See HPI   Past Medical History:  Diagnosis Date   Abnormal uterine bleeding    Acid reflux    Alpha thalassemia trait    Amenorrhea    Anemia    no current med.   Asthma    Back pain    Cesarean delivery delivered 10/11/2019   10/09/2019 - primary CS for failed IOL   Chest pain    resolved   Complication of anesthesia    states had to keep giving her anesthesia during EGD   Constipation    Depression    "I'm good"   Fibromyalgia    Finger mass, left 03/2017    middle finger   Gestational hypertension 09/23/2019   Guidelines for Antenatal Testing and Sonography  (with updated ICD-10 codes)  Updated  September 21, 2019 with Dr. Noralee Space  INDICATION U/S 2 X week NST/AFI  or full BPP wkly DELIVERY Diabetes   A1 - good control - O24.410    A2 - good control - O24.419      A2  - poor control or poor compliance - O24.419, E11.65   (Macrosomia or polyhydramnios) **E11.65 is extra code for poor control**    A2/B - O24.   Headache    Hepatitis 07/18/2022   Hepatitis B   History of kidney stones    Hypertension    Infection    UTI   Irritable bowel syndrome (IBS)    Joint pain    Morbid obesity with body mass index (BMI) of 45.0 to 49.9 in adult [facility])    Obesity during pregnancy, antepartum 04/06/2019   Body mass index is 53.71 kg/m.  Recommendations [x]  Aspirin 81 mg daily after 12 weeks; discontinue after 36 weeks [ ]  Nutrition consult [ ]  Weight gain 11-20 lbs for singleton and 25-35 lbs for twin pregnancy (IOM guidelines) Higher class of obesity patients recommended to gain closer to lower limit  Weight loss is associated with adverse outcomes [ ]  Baseline and surveillance labs (pulled in fr   Plantar fasciitis, bilateral    resolved   Polycystic ovary syndrome    Pre-diabetes    no meds, diet controlled, diet not check blood sugar  Pregnancy induced hypertension    Shortness of breath    albuterol inhaler   Sleep apnea    no CPAP use   Supervision of normal first pregnancy 04/06/2019   [name]: [ x]Initial [ x]12 [ ] 20 [ ] 28 [ ] 32 [ ] 36 [ ] 38 [ ] 39 [ ] 40 Nursing Staff Provider Office Location [facility]  Dating   LMP Language  English  Anatomy US  Nml Flu Vaccine  Declined 07/28/19 Genetic Screen  NIPS: low risks   AFP:   negative  TDaP vaccine   08/25/19 Hgb A1C or  GTT Early A1C 5.8 Third trimester: GDM insulin Rhogam  NA   LAB RESULTS  Feeding Plan Breast Blood Type   Vertigo 2017    Social History   Socioeconomic History   Marital  status: Single    Spouse name: Not on file   Number of children: Not on file   Years of education: Not on file   Highest education level: Not on file  Occupational History   Occupation: IT sales professional    Employer: [id number]  Tobacco Use   Smoking status: Never   Smokeless tobacco: Never  Vaping Use   Vaping Use: Never used  Substance and Sexual Activity   Alcohol use: Not Currently    Comment: social    Drug use: No   Sexual activity: Yes    Birth control/protection: None  Other Topics Concern   Not on file  Social History Narrative   Right handed    Soda sometimes   Lives with mom and cousin, a grandmother in [place] also helps care for her.       She is in nursing school.    Social Determinants of Health   Financial Resource Strain: Not on file  Food Insecurity: Not on file  Transportation Needs: No Transportation Needs (11/18/2022)   PRAPARE - Administrator, Civil Service (Medical): No    Lack of Transportation (Non-Medical): No  Physical Activity: Not on file  Stress: Not on file  Social Connections: Not on file  Intimate Partner Violence: Not on file    Past Surgical History:  Procedure Laterality Date   CESAREAN SECTION N/A 10/09/2019   Procedure: CESAREAN SECTION;  Surgeon: [place] Kathryn Bing, MD;  Location: [facility];  Service: Obstetrics;  Laterality: N/A;   COLONOSCOPY WITH PROPOFOL  10/07/2016   DILATION AND CURETTAGE OF UTERUS     ESOPHAGOGASTRODUODENOSCOPY  12/21/2015   EXCISION MASS UPPER EXTREMETIES Left 04/21/2017   Procedure: EXCISION MASS LEFT MIDDLE FINGER;  Surgeon: Cindee Salt, MD;  Location: [facility];  Service: Orthopedics;  Laterality: Left;   gastric sleeve  05/09/2022   IR FL GUIDED LOC OF NEEDLE/CATH TIP FOR SPINAL INJECTION RT  03/13/2020   KNEE ARTHROSCOPY WITH LATERAL MENISECTOMY Left 10/31/2022   Procedure: KNEE ARTHROSCOPY WITH PARTIAL LATERAL MENISECTOMY;  Surgeon: Yolonda Kida, MD;  Location: [facility]  ORS;  Service: Orthopedics;  Laterality: Left;  75   KNEE ARTHROSCOPY WITH LATERAL RELEASE Left 10/31/2022   Procedure: KNEE ARTHROSCOPY WITH LATERAL RELEASE AND LIMITED SYNOVECTOMY;  Surgeon: Yolonda Kida, MD;  Location: [facility] ORS;  Service: Orthopedics;  Laterality: Left;  75   PHOTOCOAGULATION WITH LASER Left 12/13/2020   Procedure: RETINOPLEXY LEFT EYE;  Surgeon: Stephannie Li, MD;  Location: [facility] OR;  Service: Ophthalmology;  Laterality: Left;   SCLERAL BUCKLE Right 12/13/2020   Procedure: SCLERAL BUCKLE WITH CRYO;  Surgeon: Stephannie Li, MD;  Location: [facility] OR;  Service: Ophthalmology;  Laterality: Right;    Family History  Problem Relation Age of Onset   Diabetes Maternal Aunt    Hypertension Maternal Uncle    Depression Maternal Grandfather    Diabetes Maternal Grandfather    Hypertension Maternal Grandfather    Arthritis Mother    High blood pressure Mother    Depression Mother    Sleep apnea Mother    Obesity Mother    Diabetes Mother    Hypertension Mother    Kidney failure Mother    Sleep apnea Father    Obesity Father    Healthy Daughter     Allergies  Allergen Reactions   Bee Venom Hives, Shortness Of Breath and Swelling   Hydrocodone-Acetaminophen Anaphylaxis    No problem when she takes Tylenol   Estate agent, Shortness Of Breath and Other (See Comments)    SWELLING OF MOUTH   Pollen Extract Hives, Shortness Of Breath and Swelling   Remdesivir Swelling    Angioedema 8/25   Latex Rash   Cortisone Itching and Swelling   Cortizone-10 [Hydrocortisone]     Swelling and itching   Toradol [Ketorolac Tromethamine] Swelling    Current Outpatient Medications on File Prior to Visit  Medication Sig Dispense Refill   albuterol (VENTOLIN HFA) 108 (90 Base) MCG/ACT inhaler Inhale 2 puffs into the lungs every 6 (six) hours as needed for wheezing or shortness of breath. 8 g 0   ALPRAZolam (XANAX) 0.5 MG tablet Take 0.5 mg by mouth daily as needed for anxiety.      APPLE CIDER VINEGAR PO Take 1 tablet by mouth daily.     Blood Pressure Monitoring (BLOOD PRESSURE MON/AUTO/WRIST) DEVI Use to monitor blood pressure 1 each 0   budesonide-formoterol (SYMBICORT) 160-4.5 MCG/ACT inhaler Inhale 2 puffs into the lungs 2 (two) times daily. 1 each 6   CALCIUM PO Take 1 tablet by mouth daily.     COLLAGEN PO Take 1 tablet by mouth daily.     fluconazole (DIFLUCAN) 150 MG tablet Take 1 tablet (150 mg total) by mouth daily. 1 tablet 0   fluticasone (FLONASE) 50 MCG/ACT nasal spray Place 2 sprays into both nostrils daily. 16 g 6   hydrocortisone (ANUSOL-HC) 25 MG suppository Place 1 suppository (25 mg total) rectally 2 (two) times daily. 12 suppository 0   levonorgestrel (MIRENA, 52 MG,) 20 MCG/DAY IUD 1 each by Intrauterine route once.     loratadine (CLARITIN) 10 MG tablet Take 10 mg by mouth daily as needed for allergies.      magic mouthwash w/lidocaine SOLN Take 5 mLs by mouth 3 (three) times daily as needed (gargle and spit). (Patient taking differently: Take 5 mLs by mouth 3 (three) times daily as needed for mouth pain.) 180 mL 0   metroNIDAZOLE (FLAGYL) 500 MG tablet Take 1 tablet (500 mg total) by mouth 2 (two) times daily. 14 tablet 0   Multiple Vitamins-Minerals (BARIATRIC MULTIVITAMINS/IRON PO) Take 1 tablet by mouth daily.     Multiple Vitamins-Minerals (HAIR SKIN NAILS PO) Take 1 tablet by mouth daily.     naloxone (NARCAN) nasal spray 4 mg/0.1 mL Place 0.4 mg into the nose once.     omeprazole (PRILOSEC) 40 MG capsule Take by mouth.     ondansetron (ZOFRAN) 4 MG tablet Take 1 tablet (4 mg total) by mouth every 8 (eight) hours as needed. 20 tablet 0   ondansetron (ZOFRAN-ODT) 8 MG disintegrating tablet Take 8 mg by mouth every 8 (eight)  hours as needed for vomiting or nausea.     oxyCODONE-acetaminophen (PERCOCET/ROXICET) 5-325 MG tablet Take 1 tablet by mouth 6 (six) times daily. 30 tablet 0   pantoprazole (PROTONIX) 40 MG tablet Take 1 tablet (40 mg  total) by mouth daily. 90 tablet 0   phentermine (ADIPEX-P) 37.5 MG tablet Take by mouth.     predniSONE (DELTASONE) 20 MG tablet Take 2 tablets (40 mg total) by mouth daily. 10 tablet 0   pregabalin (LYRICA) 75 MG capsule Take 75 mg by mouth at bedtime.     tamsulosin (FLOMAX) 0.4 MG CAPS capsule Take 1 capsule (0.4 mg total) by mouth daily. 30 capsule 2   tiZANidine (ZANAFLEX) 4 MG tablet Take 4 mg by mouth 3 (three) times daily as needed for muscle spasms.     topiramate (TOPAMAX) 50 MG tablet Take 50 mg by mouth 2 (two) times daily.     TRULICITY 1.5 MG/0.5ML SOPN Inject into the skin.     ferrous sulfate 325 (65 FE) MG tablet Take 1 tablet (325 mg total) by mouth every other day. (Patient not taking: Reported on 10/21/2022) 45 tablet 1   nitroGLYCERIN (NITRODUR - DOSED IN MG/24 HR) 0.2 mg/hr patch SMARTSIG:0.25 Patch(s) Topical Daily     TRANSDERM-SCOP 1 MG/3DAYS 1 patch every 3 (three) days.     No current facility-administered medications on file prior to visit.    BP 110/88   Pulse 85   Temp 99 F (37.2 C) (Oral)   Ht 6\' 3"  (1.905 m)   Wt (!) 390 lb (176.9 kg)   SpO2 97%   BMI 48.75 kg/m       Objective:   Physical Exam Vitals and nursing note reviewed.  Constitutional:      Appearance: Normal appearance.  Cardiovascular:     Rate and Rhythm: Normal rate and regular rhythm.     Pulses: Normal pulses.     Heart sounds: Normal heart sounds.  Pulmonary:     Effort: Pulmonary effort is normal.     Breath sounds: Normal breath sounds.  Skin:    General: Skin is warm and dry.  Neurological:     General: No focal deficit present.     Mental Status: She is alert and oriented to person, place, and time.  Psychiatric:        Mood and Affect: Mood normal.        Behavior: Behavior normal.        Thought Content: Thought content normal.        Judgment: Judgment normal.       Assessment & Plan:  1. Abnormal uterine bleeding - Follow up with GYN   - CBC with  Differential/Platelet; Future - IBC + Ferritin; Future - TSH; Future  2. Other fatigue - Consider restarting iron supplement  - CBC with Differential/Platelet; Future - IBC + Ferritin; Future - TSH; Future  3. Nausea  - CBC with Differential/Platelet; Future - IBC + Ferritin; Future - TSH; Future  4. Screening for STD (sexually transmitted disease)  - Urine cytology ancillary only  Time spent with patient today was 32 minutes which consisted of chart review, discussing diagnosis, work up, treatment answering questions and documentation.

## 2023-04-01 LAB — URINE CYTOLOGY ANCILLARY ONLY
Chlamydia: NEGATIVE
Comment: NEGATIVE
Comment: NEGATIVE
Comment: NORMAL
Neisseria Gonorrhea: NEGATIVE
Trichomonas: NEGATIVE

## 2023-04-02 DIAGNOSIS — F439 Reaction to severe stress, unspecified: Secondary | ICD-10-CM | POA: Diagnosis not present

## 2023-04-06 ENCOUNTER — Inpatient Hospital Stay (HOSPITAL_COMMUNITY)
Admission: AD | Admit: 2023-04-06 | Discharge: 2023-04-06 | Disposition: A | Discharge status: Home / Self Care | Payer: Medicaid Other | Attending: Family Medicine | Admitting: Family Medicine

## 2023-04-06 ENCOUNTER — Inpatient Hospital Stay (HOSPITAL_COMMUNITY): Payer: Medicaid Other

## 2023-04-06 ENCOUNTER — Encounter (HOSPITAL_COMMUNITY): Payer: Self-pay | Admitting: *Deleted

## 2023-04-06 DIAGNOSIS — M549 Dorsalgia, unspecified: Secondary | ICD-10-CM | POA: Insufficient documentation

## 2023-04-06 DIAGNOSIS — O99891 Other specified diseases and conditions complicating pregnancy: Secondary | ICD-10-CM | POA: Diagnosis not present

## 2023-04-06 DIAGNOSIS — O3680X0 Pregnancy with inconclusive fetal viability, not applicable or unspecified: Secondary | ICD-10-CM | POA: Diagnosis not present

## 2023-04-06 DIAGNOSIS — O99211 Obesity complicating pregnancy, first trimester: Secondary | ICD-10-CM | POA: Diagnosis not present

## 2023-04-06 DIAGNOSIS — F439 Reaction to severe stress, unspecified: Secondary | ICD-10-CM | POA: Diagnosis not present

## 2023-04-06 DIAGNOSIS — N8301 Follicular cyst of right ovary: Secondary | ICD-10-CM | POA: Diagnosis not present

## 2023-04-06 DIAGNOSIS — O3481 Maternal care for other abnormalities of pelvic organs, first trimester: Secondary | ICD-10-CM | POA: Insufficient documentation

## 2023-04-06 DIAGNOSIS — Z3201 Encounter for pregnancy test, result positive: Secondary | ICD-10-CM | POA: Diagnosis present

## 2023-04-06 DIAGNOSIS — N83291 Other ovarian cyst, right side: Secondary | ICD-10-CM | POA: Diagnosis not present

## 2023-04-06 DIAGNOSIS — R102 Pelvic and perineal pain: Secondary | ICD-10-CM | POA: Diagnosis not present

## 2023-04-06 DIAGNOSIS — Z3A00 Weeks of gestation of pregnancy not specified: Secondary | ICD-10-CM | POA: Diagnosis not present

## 2023-04-06 LAB — WET PREP, GENITAL
Clue Cells Wet Prep HPF POC: NONE SEEN
Sperm: NONE SEEN
Trich, Wet Prep: NONE SEEN
WBC, Wet Prep HPF POC: >=10 — AB (ref <10)
Yeast Wet Prep HPF POC: NONE SEEN

## 2023-04-06 LAB — URINALYSIS, ROUTINE W REFLEX MICROSCOPIC
Bacteria, UA: NONE SEEN
Bilirubin Urine: NEGATIVE
Glucose, UA: NEGATIVE mg/dL
Hgb urine dipstick: NEGATIVE
Ketones, ur: NEGATIVE mg/dL
Nitrite: NEGATIVE
Protein, ur: NEGATIVE mg/dL
Specific Gravity, Urine: 1.024 (ref 1.005–1.030)
pH: 6 (ref 5.0–8.0)

## 2023-04-06 LAB — COMPREHENSIVE METABOLIC PANEL
ALT: 12 U/L (ref 0–44)
AST: 14 U/L — ABNORMAL LOW (ref 15–41)
Albumin: 3.5 g/dL (ref 3.5–5.0)
Alkaline Phosphatase: 68 U/L (ref 38–126)
Anion gap: 11 (ref 5–15)
BUN: 8 mg/dL (ref 6–20)
CO2: 24 mmol/L (ref 22–32)
Calcium: 8.9 mg/dL (ref 8.9–10.3)
Chloride: 102 mmol/L (ref 98–111)
Creatinine, Ser: 0.55 mg/dL (ref 0.44–1.00)
GFR, Estimated: >60 mL/min (ref >60)
Glucose, Bld: 102 mg/dL — ABNORMAL HIGH (ref 70–99)
Potassium: 4.1 mmol/L (ref 3.5–5.1)
Sodium: 137 mmol/L (ref 135–145)
Total Bilirubin: 1.2 mg/dL (ref 0.3–1.2)
Total Protein: 6.9 g/dL (ref 6.5–8.1)

## 2023-04-06 LAB — CBC
HCT: 39.8 % (ref 36.0–46.0)
Hemoglobin: 12.6 g/dL (ref 12.0–15.0)
MCH: 24.7 pg — ABNORMAL LOW (ref 26.0–34.0)
MCHC: 31.7 g/dL (ref 30.0–36.0)
MCV: 77.9 fL — ABNORMAL LOW (ref 80.0–100.0)
Platelets: 233 10*3/uL (ref 150–400)
RBC: 5.11 MIL/uL (ref 3.87–5.11)
RDW: 13.3 % (ref 11.5–15.5)
WBC: 4.5 10*3/uL (ref 4.0–10.5)
nRBC: 0 % (ref 0.0–0.2)

## 2023-04-06 LAB — POCT PREGNANCY, URINE: Preg Test, Ur: POSITIVE — AB

## 2023-04-06 LAB — HCG, QUANTITATIVE, PREGNANCY: hCG, Beta Chain, Quant, S: 113 m[IU]/mL — ABNORMAL HIGH (ref <5)

## 2023-04-06 NOTE — MAU Note (Signed)
Maureen Lewis is a 27 y.o. at Unknown here in MAU reporting: been experiencing really bad abd, back and vagina pain last wk. Was really bad last night.  Thought her cycle  was going to start- but didn't.  +HPT yeterday.  Was seen 6/11 for abn bleeding- had been bleeding for a month, this was a follow up appt. Bleeding had stopped around June 1.  LMP: ? Irreg bleeding; neg blood test 5/19 Onset of complaint: wk ago Pain score: 7-9 Vitals:   04/06/23 0946  BP: 138/78  Pulse: 80  Resp: 18  Temp: 97.9 F (36.6 C)  SpO2: 98%      Lab orders placed from triage:  UA/UPT

## 2023-04-06 NOTE — MAU Provider Note (Signed)
History     CSN: [id number]  Arrival date and time: 04/06/23 6578    Chief Complaint  Patient presents with   Abdominal Pain   Back Pain   Possible Pregnancy   HPI This is a 27 year old G53 P1-0-2-1 with a history of irregular menses and a prior cesarean section.  She presents with pelvic cramping that started 2 to 3 days ago and has been worsening.  She was supposed to have a period that started this past weekend.  When here.  Did not start, she took a urine pregnancy test Sunday which was positive.  Her urine pregnancy test last week was negative.  No palliating or provoking factors with her cramping.  She does have irregular periods, often skipping 2 to 3 months in between periods.  She has prolonged bleeding episodes.  Her last menses started in the beginning of May and she had prolonged bleeding for approximately 4 weeks.  She stopped bleeding beginning of June.  She has been told that she has PCOS  OB History     Gravida  4   Para  1   Term  1   Preterm  0   AB  2   Living  1      SAB  2   IAB  0   Ectopic  0   Multiple  0   Live Births  1           Past Medical History:  Diagnosis Date   Abnormal uterine bleeding    Acid reflux    Alpha thalassemia trait    Amenorrhea    Anemia    no current med.   Asthma    Back pain    Cesarean delivery delivered 10/11/2019   10/09/2019 - primary CS for failed IOL   Chest pain    resolved   Complication of anesthesia    states had to keep giving her anesthesia during EGD   Constipation    Depression    "I'm good"   Fibromyalgia    Finger mass, left 03/2017   middle finger   Gestational hypertension 09/23/2019   Guidelines for Antenatal Testing and Sonography  (with updated ICD-10 codes)  Updated  Sep 21, 2019 with Dr. Noralee Space  INDICATION U/S 2 X week NST/AFI  or full BPP wkly DELIVERY Diabetes   A1 - good control - O24.410    A2 - good control - O24.419      A2  - poor control or poor compliance -  O24.419, E11.65   (Macrosomia or polyhydramnios) **E11.65 is extra code for poor control**    A2/B - O24.   Headache    Hepatitis 07/18/2022   Hepatitis B   History of kidney stones    Hypertension    Infection    UTI   Irritable bowel syndrome (IBS)    Joint pain    Morbid obesity with body mass index (BMI) of 45.0 to 49.9 in adult [facility])    Obesity during pregnancy, antepartum 04/06/2019   Body mass index is 53.71 kg/m.  Recommendations [x]  Aspirin 81 mg daily after 12 weeks; discontinue after 36 weeks [ ]  Nutrition consult [ ]  Weight gain 11-20 lbs for singleton and 25-35 lbs for twin pregnancy (IOM guidelines) Higher class of obesity patients recommended to gain closer to lower limit  Weight loss is associated with adverse outcomes [ ]  Baseline and surveillance labs (pulled in fr   Plantar fasciitis, bilateral    resolved  Polycystic ovary syndrome    Pre-diabetes    no meds, diet controlled, diet not check blood sugar   Pregnancy induced hypertension    Shortness of breath    albuterol inhaler   Sleep apnea    no CPAP use   Supervision of normal first pregnancy 04/06/2019   [name]: [ x]Initial [ x]12 [ ] 20 [ ] 28 [ ] 32 [ ] 36 [ ] 38 [ ] 39 [ ] 40 Nursing Staff Provider Office Location [facility]  Dating   LMP Language  English  Anatomy US  Nml Flu Vaccine  Declined 07/28/19 Genetic Screen  NIPS: low risks   AFP:   negative  TDaP vaccine   08/25/19 Hgb A1C or  GTT Early A1C 5.8 Third trimester: GDM insulin Rhogam  NA   LAB RESULTS  Feeding Plan Breast Blood Type   Vertigo 2017    Past Surgical History:  Procedure Laterality Date   CESAREAN SECTION N/A 10/09/2019   Procedure: CESAREAN SECTION;  Surgeon: Nelson Lagoon Bing, MD;  Location: [facility];  Service: Obstetrics;  Laterality: N/A;   COLONOSCOPY WITH PROPOFOL  10/07/2016   DILATION AND CURETTAGE OF UTERUS     ESOPHAGOGASTRODUODENOSCOPY  12/21/2015   EXCISION MASS UPPER EXTREMETIES Left 04/21/2017   Procedure:  EXCISION MASS LEFT MIDDLE FINGER;  Surgeon: Cindee Salt, MD;  Location: [facility];  Service: Orthopedics;  Laterality: Left;   gastric sleeve  05/09/2022   IR FL GUIDED LOC OF NEEDLE/CATH TIP FOR SPINAL INJECTION RT  03/13/2020   KNEE ARTHROSCOPY WITH LATERAL MENISECTOMY Left 10/31/2022   Procedure: KNEE ARTHROSCOPY WITH PARTIAL LATERAL MENISECTOMY;  Surgeon: Yolonda Kida, MD;  Location: [facility] ORS;  Service: Orthopedics;  Laterality: Left;  75   KNEE ARTHROSCOPY WITH LATERAL RELEASE Left 10/31/2022   Procedure: KNEE ARTHROSCOPY WITH LATERAL RELEASE AND LIMITED SYNOVECTOMY;  Surgeon: Yolonda Kida, MD;  Location: [facility] ORS;  Service: Orthopedics;  Laterality: Left;  75   PHOTOCOAGULATION WITH LASER Left 12/13/2020   Procedure: RETINOPLEXY LEFT EYE;  Surgeon: Stephannie Li, MD;  Location: [facility] OR;  Service: Ophthalmology;  Laterality: Left;   SCLERAL BUCKLE Right 12/13/2020   Procedure: SCLERAL BUCKLE WITH CRYO;  Surgeon: Stephannie Li, MD;  Location: [facility] OR;  Service: Ophthalmology;  Laterality: Right;    Family History  Problem Relation Age of Onset   Diabetes Maternal Aunt    Hypertension Maternal Uncle    Depression Maternal Grandfather    Diabetes Maternal Grandfather    Hypertension Maternal Grandfather    Arthritis Mother    High blood pressure Mother    Depression Mother    Sleep apnea Mother    Obesity Mother    Diabetes Mother    Hypertension Mother    Kidney failure Mother    Sleep apnea Father    Obesity Father    Healthy Daughter     Social History   Tobacco Use   Smoking status: Never   Smokeless tobacco: Never  Vaping Use   Vaping Use: Never used  Substance Use Topics   Alcohol use: Not Currently    Comment: social    Drug use: No    Allergies:  Allergies  Allergen Reactions   Bee Venom Hives, Shortness Of Breath and Swelling   Hydrocodone-Acetaminophen Anaphylaxis    No problem when she takes Tylenol   Peach Flavor Hives,  Shortness Of Breath and Other (See Comments)    SWELLING OF MOUTH   Pollen Extract Hives, Shortness Of Breath and Swelling  Remdesivir Swelling    Angioedema 8/25   Latex Rash   Cortisone Itching and Swelling   Cortizone-10 [Hydrocortisone]     Swelling and itching   Toradol [Ketorolac Tromethamine] Swelling    Medications Prior to Admission  Medication Sig Dispense Refill Last Dose   fluticasone (FLONASE) 50 MCG/ACT nasal spray Place 2 sprays into both nostrils daily. 16 g 6 Past Month   loratadine (CLARITIN) 10 MG tablet Take 10 mg by mouth daily as needed for allergies.    Past Month   nitroGLYCERIN (NITRODUR - DOSED IN MG/24 HR) 0.2 mg/hr patch SMARTSIG:0.25 Patch(s) Topical Daily   04/06/2023   ondansetron (ZOFRAN) 4 MG tablet Take 1 tablet (4 mg total) by mouth every 8 (eight) hours as needed. 20 tablet 0 04/05/2023   phentermine (ADIPEX-P) 37.5 MG tablet Take by mouth.   04/06/2023   pregabalin (LYRICA) 75 MG capsule Take 75 mg by mouth at bedtime.   04/06/2023   tamsulosin (FLOMAX) 0.4 MG CAPS capsule Take 1 capsule (0.4 mg total) by mouth daily. 30 capsule 2 04/06/2023   tiZANidine (ZANAFLEX) 4 MG tablet Take 4 mg by mouth 3 (three) times daily as needed for muscle spasms.   04/06/2023   topiramate (TOPAMAX) 50 MG tablet Take 50 mg by mouth 2 (two) times daily.   04/06/2023   APPLE CIDER VINEGAR PO Take 1 tablet by mouth daily.      CALCIUM PO Take 1 tablet by mouth daily. (Patient not taking: Reported on 04/06/2023)   Not Taking   COLLAGEN PO Take 1 tablet by mouth daily.      levonorgestrel (MIRENA, 52 MG,) 20 MCG/DAY IUD 1 each by Intrauterine route once. (Patient not taking: Reported on 04/06/2023)   Not Taking   metroNIDAZOLE (FLAGYL) 500 MG tablet Take 1 tablet (500 mg total) by mouth 2 (two) times daily. (Patient not taking: Reported on 04/06/2023) 14 tablet 0 Not Taking   Multiple Vitamins-Minerals (BARIATRIC MULTIVITAMINS/IRON PO) Take 1 tablet by mouth daily.      Multiple  Vitamins-Minerals (HAIR SKIN NAILS PO) Take 1 tablet by mouth daily.      naloxone (NARCAN) nasal spray 4 mg/0.1 mL Place 0.4 mg into the nose once.      ondansetron (ZOFRAN-ODT) 8 MG disintegrating tablet Take 8 mg by mouth every 8 (eight) hours as needed for vomiting or nausea.       Review of Systems Physical Exam   Blood pressure 120/67, pulse 79, temperature 97.9 F (36.6 C), temperature source Oral, resp. rate 18, height 6' 2.5" (1.892 m), weight (!) 175.5 kg, SpO2 98 %.  Physical Exam Vitals reviewed.  Constitutional:      Appearance: She is well-developed.  HENT:     Head: Normocephalic and atraumatic.  Cardiovascular:     Rate and Rhythm: Normal rate and regular rhythm.  Abdominal:     General: Abdomen is flat.     Tenderness: There is abdominal tenderness in the right lower quadrant and left lower quadrant. There is no guarding or rebound.  Skin:    General: Skin is warm and dry.  Neurological:     Mental Status: She is alert.   Results for orders placed or performed during the hospital encounter of 04/06/23 (from the past 24 hour(s))  Pregnancy, urine POC     Status: Abnormal   Collection Time: 04/06/23  9:39 AM  Result Value Ref Range   Preg Test, Ur POSITIVE (A) NEGATIVE  Urinalysis, Routine w reflex microscopic -  Urine, Clean Catch     Status: Abnormal   Collection Time: 04/06/23 10:26 AM  Result Value Ref Range   Color, Urine YELLOW YELLOW   APPearance CLEAR CLEAR   Specific Gravity, Urine 1.024 1.005 - 1.030   pH 6.0 5.0 - 8.0   Glucose, UA NEGATIVE NEGATIVE mg/dL   Hgb urine dipstick NEGATIVE NEGATIVE   Bilirubin Urine NEGATIVE NEGATIVE   Ketones, ur NEGATIVE NEGATIVE mg/dL   Protein, ur NEGATIVE NEGATIVE mg/dL   Nitrite NEGATIVE NEGATIVE   Leukocytes,Ua SMALL (A) NEGATIVE   RBC / HPF 0-5 0 - 5 RBC/hpf   WBC, UA 0-5 0 - 5 WBC/hpf   Bacteria, UA NONE SEEN NONE SEEN   Squamous Epithelial / HPF 0-5 0 - 5 /HPF  Wet prep, genital     Status: Abnormal    Collection Time: 04/06/23 10:26 AM   Specimen: PATH Cytology Cervicovaginal Ancillary Only  Result Value Ref Range   Yeast Wet Prep HPF POC NONE SEEN NONE SEEN   Trich, Wet Prep NONE SEEN NONE SEEN   Clue Cells Wet Prep HPF POC NONE SEEN NONE SEEN   WBC, Wet Prep HPF POC >=10 (A) <10   Sperm NONE SEEN   CBC     Status: Abnormal   Collection Time: 04/06/23 10:49 AM  Result Value Ref Range   WBC 4.5 4.0 - 10.5 K/uL   RBC 5.11 3.87 - 5.11 MIL/uL   Hemoglobin 12.6 12.0 - 15.0 g/dL   HCT 78.2 95.6 - 21.3 %   MCV 77.9 (L) 80.0 - 100.0 fL   MCH 24.7 (L) 26.0 - 34.0 pg   MCHC 31.7 30.0 - 36.0 g/dL   RDW 08.6 57.8 - 46.9 %   Platelets 233 150 - 400 K/uL   nRBC 0.0 0.0 - 0.2 %  hCG, quantitative, pregnancy     Status: Abnormal   Collection Time: 04/06/23 10:49 AM  Result Value Ref Range   hCG, Beta Chain, Quant, S 113 (H) <5 mIU/mL  Comprehensive metabolic panel     Status: Abnormal   Collection Time: 04/06/23 10:49 AM  Result Value Ref Range   Sodium 137 135 - 145 mmol/L   Potassium 4.1 3.5 - 5.1 mmol/L   Chloride 102 98 - 111 mmol/L   CO2 24 22 - 32 mmol/L   Glucose, Bld 102 (H) 70 - 99 mg/dL   BUN 8 6 - 20 mg/dL   Creatinine, Ser 6.29 0.44 - 1.00 mg/dL   Calcium 8.9 8.9 - 52.8 mg/dL   Total Protein 6.9 6.5 - 8.1 g/dL   Albumin 3.5 3.5 - 5.0 g/dL   AST 14 (L) 15 - 41 U/L   ALT 12 0 - 44 U/L   Alkaline Phosphatase 68 38 - 126 U/L   Total Bilirubin 1.2 0.3 - 1.2 mg/dL   GFR, Estimated >41 >32 mL/min   Anion gap 11 5 - 15    US OB LESS THAN 14 WEEKS WITH OB TRANSVAGINAL  Result Date: 04/06/2023 CLINICAL DATA:  Pelvic pain EXAM: OBSTETRIC <14 WK [name] AND TRANSVAGINAL OB US TECHNIQUE: Both transabdominal and transvaginal ultrasound examinations were performed for complete evaluation of the gestation as well as the maternal uterus, adnexal regions, and pelvic cul-de-sac. Transvaginal technique was performed to assess early pregnancy. COMPARISON:  03/08/2023 FINDINGS: Intrauterine  gestational sac: None Yolk sac:  Not seen Embryo:  Not seen Cardiac Activity: Not seen Subchorionic hemorrhage:  None visualized. Maternal uterus/adnexae: There is 1.3 cm hemorrhagic follicle  in right ovary. Small amount of free fluid is seen in pelvis. IMPRESSION: There is no demonstrable intrauterine gestational sac. There is 1.3 cm complex cyst in the right adnexa, possibly hemorrhagic follicle. No dominant adnexal masses are seen. Small amount of free fluid is seen in pelvis. Electronically Signed   By: Ernie Avena M.D.   On: 04/06/2023 11:51     MAU Course  Procedures  MDM: moderate  This patient presents to the ED for concern of   Chief Complaint  Patient presents with   Abdominal Pain   Back Pain   Possible Pregnancy     Co morbidities that complicate the patient evaluation: BMI 49  Lab Tests: CMP, CBC, Wet prep, BHCG, and Gonorrhea/Chlamydia   I ordered, and personally interpreted labs.  The pertinent results include:  Transvaginal US  Imaging Studies ordered:  I ordered imaging studies includingTransvaginal [name] I independently visualized and interpreted imaging which showed hemorrhagic cyst on the right. No gestational sac seen.  I agree with the radiologist interpretation  MAU Course: After the interventions noted above, I reevaluated the patient and found that they have :improved  Dispostion: discharged   Assessment and Plan   1. Pregnancy of unknown anatomic location    F/u HCG on Wednesday (48hrs). Return precautions given.  Levie Heritage 04/06/2023, 10:25 AM

## 2023-04-07 DIAGNOSIS — F439 Reaction to severe stress, unspecified: Secondary | ICD-10-CM | POA: Diagnosis not present

## 2023-04-07 LAB — GC/CHLAMYDIA PROBE AMP (~~LOC~~) NOT AT ARMC
Chlamydia: NEGATIVE
Comment: NEGATIVE
Comment: NORMAL
Neisseria Gonorrhea: NEGATIVE

## 2023-04-08 ENCOUNTER — Ambulatory Visit (INDEPENDENT_AMBULATORY_CARE_PROVIDER_SITE_OTHER): Payer: Medicaid Other | Admitting: General Practice

## 2023-04-08 ENCOUNTER — Encounter: Payer: Self-pay | Admitting: General Practice

## 2023-04-08 ENCOUNTER — Other Ambulatory Visit: Payer: Self-pay

## 2023-04-08 VITALS — BP 125/74 | HR 79 | Ht 74.0 in | Wt 384.0 lb

## 2023-04-08 DIAGNOSIS — R519 Headache, unspecified: Secondary | ICD-10-CM

## 2023-04-08 DIAGNOSIS — O26891 Other specified pregnancy related conditions, first trimester: Secondary | ICD-10-CM

## 2023-04-08 DIAGNOSIS — Z3A01 Less than 8 weeks gestation of pregnancy: Secondary | ICD-10-CM

## 2023-04-08 DIAGNOSIS — O3680X0 Pregnancy with inconclusive fetal viability, not applicable or unspecified: Secondary | ICD-10-CM

## 2023-04-08 LAB — BETA HCG QUANT (REF LAB): hCG Quant: 241 m[IU]/mL

## 2023-04-08 MED ORDER — ACETAMINOPHEN-CAFFEINE 500-65 MG PO TABS
1.0000 | ORAL_TABLET | Freq: Four times a day (QID) | ORAL | 0 refills | Status: DC | PRN
Start: 2023-04-08 — End: 2023-05-06

## 2023-04-08 MED ORDER — VITAFOL GUMMIES 3.33-0.333-34.8 MG PO CHEW
1.0000 | CHEWABLE_TABLET | Freq: Every day | ORAL | 3 refills | Status: AC
Start: 2023-04-08 — End: ?

## 2023-04-08 NOTE — Progress Notes (Signed)
Beta HCG Follow-up Visit  Maureen Lewis presents to [facility] for follow-up beta HCG lab. She was seen in MAU for abdominal pain on 6/17. Patient reports  continued cramping  today but is overall less than when she went to MAU. Discussed with patient that we are following beta HCG levels today. Results will be back in approximately 2 hours. Valid contact number for patient confirmed. I will call the patient with results.   Patient states she forgot to review med list when she was in MAU the other day but currently takes Zanaflex, percocet (from pain management), lyrica for sleep, and Trulicity. She has stopped most of these on Sunday due to pregnancy. Patient also reports bad headaches for the past 1.5 weeks. She has tried extra strength tylenol & drinking mountain dew for the caffeine. Discussed she may continue percocet zanaflex per Dr Bass but she will need to discontinue the Trulicity. Told patient I would call her back about Lyrica safety. Acetaminophen+Caffeine prescribed for headaches and reviewed dosing instructions and liver toxicity with patient.    Beta HCG results:          6/17         113           6/19         24 1      Results and patient history reviewed with Dr Donavan Foil, who states bhcg levels are rising appropriately, patient should have follow up ultrasound in 3 weeks. Scheduled 7/8 at 815. Patient called and informed of plan for follow-up as well as reviewed ectopic precautions. Discussed with patient d/c Lyrica to due safety concerns. Patient verbalized understanding.  Marylynn Pearson 04/08/2023 8:40 AM

## 2023-04-15 ENCOUNTER — Inpatient Hospital Stay (HOSPITAL_COMMUNITY): Payer: Medicaid Other

## 2023-04-15 ENCOUNTER — Encounter (HOSPITAL_COMMUNITY): Payer: Self-pay | Admitting: Family Medicine

## 2023-04-15 ENCOUNTER — Inpatient Hospital Stay (HOSPITAL_COMMUNITY)
Admission: AD | Admit: 2023-04-15 | Discharge: 2023-04-15 | Disposition: A | Discharge status: Home / Self Care | Payer: Medicaid Other | Attending: Family Medicine | Admitting: Family Medicine

## 2023-04-15 DIAGNOSIS — E282 Polycystic ovarian syndrome: Secondary | ICD-10-CM | POA: Diagnosis not present

## 2023-04-15 DIAGNOSIS — J45909 Unspecified asthma, uncomplicated: Secondary | ICD-10-CM | POA: Insufficient documentation

## 2023-04-15 DIAGNOSIS — O99211 Obesity complicating pregnancy, first trimester: Secondary | ICD-10-CM | POA: Insufficient documentation

## 2023-04-15 DIAGNOSIS — Z3A01 Less than 8 weeks gestation of pregnancy: Secondary | ICD-10-CM

## 2023-04-15 DIAGNOSIS — O26891 Other specified pregnancy related conditions, first trimester: Secondary | ICD-10-CM

## 2023-04-15 DIAGNOSIS — G4730 Sleep apnea, unspecified: Secondary | ICD-10-CM | POA: Diagnosis not present

## 2023-04-15 DIAGNOSIS — K589 Irritable bowel syndrome without diarrhea: Secondary | ICD-10-CM | POA: Insufficient documentation

## 2023-04-15 DIAGNOSIS — M797 Fibromyalgia: Secondary | ICD-10-CM | POA: Insufficient documentation

## 2023-04-15 DIAGNOSIS — K219 Gastro-esophageal reflux disease without esophagitis: Secondary | ICD-10-CM | POA: Insufficient documentation

## 2023-04-15 DIAGNOSIS — Z87442 Personal history of urinary calculi: Secondary | ICD-10-CM | POA: Insufficient documentation

## 2023-04-15 DIAGNOSIS — O99511 Diseases of the respiratory system complicating pregnancy, first trimester: Secondary | ICD-10-CM | POA: Diagnosis not present

## 2023-04-15 DIAGNOSIS — O99351 Diseases of the nervous system complicating pregnancy, first trimester: Secondary | ICD-10-CM | POA: Diagnosis present

## 2023-04-15 DIAGNOSIS — O3680X0 Pregnancy with inconclusive fetal viability, not applicable or unspecified: Secondary | ICD-10-CM | POA: Diagnosis not present

## 2023-04-15 DIAGNOSIS — R109 Unspecified abdominal pain: Secondary | ICD-10-CM | POA: Diagnosis not present

## 2023-04-15 DIAGNOSIS — O161 Unspecified maternal hypertension, first trimester: Secondary | ICD-10-CM | POA: Diagnosis not present

## 2023-04-15 DIAGNOSIS — Z8249 Family history of ischemic heart disease and other diseases of the circulatory system: Secondary | ICD-10-CM | POA: Insufficient documentation

## 2023-04-15 DIAGNOSIS — O09291 Supervision of pregnancy with other poor reproductive or obstetric history, first trimester: Secondary | ICD-10-CM | POA: Insufficient documentation

## 2023-04-15 DIAGNOSIS — D563 Thalassemia minor: Secondary | ICD-10-CM | POA: Insufficient documentation

## 2023-04-15 DIAGNOSIS — O99281 Endocrine, nutritional and metabolic diseases complicating pregnancy, first trimester: Secondary | ICD-10-CM | POA: Diagnosis not present

## 2023-04-15 DIAGNOSIS — O24111 Pre-existing diabetes mellitus, type 2, in pregnancy, first trimester: Secondary | ICD-10-CM | POA: Insufficient documentation

## 2023-04-15 DIAGNOSIS — O99611 Diseases of the digestive system complicating pregnancy, first trimester: Secondary | ICD-10-CM | POA: Insufficient documentation

## 2023-04-15 DIAGNOSIS — R1032 Left lower quadrant pain: Secondary | ICD-10-CM | POA: Insufficient documentation

## 2023-04-15 LAB — COMPREHENSIVE METABOLIC PANEL
ALT: 13 U/L (ref 0–44)
AST: 14 U/L — ABNORMAL LOW (ref 15–41)
Albumin: 3.2 g/dL — ABNORMAL LOW (ref 3.5–5.0)
Alkaline Phosphatase: 60 U/L (ref 38–126)
Anion gap: 6 (ref 5–15)
BUN: 5 mg/dL — ABNORMAL LOW (ref 6–20)
CO2: 27 mmol/L (ref 22–32)
Calcium: 8.6 mg/dL — ABNORMAL LOW (ref 8.9–10.3)
Chloride: 106 mmol/L (ref 98–111)
Creatinine, Ser: 0.55 mg/dL (ref 0.44–1.00)
GFR, Estimated: >60 mL/min (ref >60)
Glucose, Bld: 94 mg/dL (ref 70–99)
Potassium: 4.1 mmol/L (ref 3.5–5.1)
Sodium: 139 mmol/L (ref 135–145)
Total Bilirubin: 0.7 mg/dL (ref 0.3–1.2)
Total Protein: 6.2 g/dL — ABNORMAL LOW (ref 6.5–8.1)

## 2023-04-15 LAB — CBC
HCT: 35.5 % — ABNORMAL LOW (ref 36.0–46.0)
Hemoglobin: 11.1 g/dL — ABNORMAL LOW (ref 12.0–15.0)
MCH: 24.8 pg — ABNORMAL LOW (ref 26.0–34.0)
MCHC: 31.3 g/dL (ref 30.0–36.0)
MCV: 79.2 fL — ABNORMAL LOW (ref 80.0–100.0)
Platelets: 213 10*3/uL (ref 150–400)
RBC: 4.48 MIL/uL (ref 3.87–5.11)
RDW: 14.1 % (ref 11.5–15.5)
WBC: 6.2 10*3/uL (ref 4.0–10.5)
nRBC: 0 % (ref 0.0–0.2)

## 2023-04-15 LAB — TYPE AND SCREEN
ABO/RH(D): A POS
Antibody Screen: NEGATIVE

## 2023-04-15 LAB — HCG, QUANTITATIVE, PREGNANCY: hCG, Beta Chain, Quant, S: 6673 m[IU]/mL — ABNORMAL HIGH (ref <5)

## 2023-04-15 MED ORDER — OXYCODONE HCL 5 MG PO TABS
10.0000 mg | ORAL_TABLET | Freq: Once | ORAL | Status: AC
  Administered 2023-04-15: 10 mg via ORAL
  Filled 2023-04-15: qty 2

## 2023-04-15 NOTE — Discharge Instructions (Signed)
You came to the MAU for abdominal pain. We did an ultrasound that showed you have a pregnancy starting in the correct place inside your uterus. To be safe, we will plan to repeat your pregnancy hormone level as an outpatient in two days at the [facility], please go there on Friday morning to have your blood drawn. We discussed that you should return to the MAU if you have crescendo abdominal pain, heavy vaginal bleeding soaking >1 pad/hour, or fever.

## 2023-04-15 NOTE — MAU Provider Note (Signed)
History     [id number]  Arrival date and time: 04/15/23 1151    No chief complaint on file.    HPI Maureen Lewis is a 27 y.o. at Unknown gestational age with PMHx notable for obesity, HTN, OHS, PCOS, who presents for abdominal pain.   Patient was seen in MAU on 04/06/2023 for evaluation of abdominal pain, note reviewed in full At that time had hcg of 113 and TVUS that showed no IUP and a complex cyst in the RIGHT adnexa, possible hemorrhagic follicle.  She was discharged and followed up two days later, per documentation of clinic staff there was an appropriate rise to 56 though strangely this result is not documented in Epic She was informed of normal rise and given MAU precautions with plan for viability scan in a few weeks Additionally on chart review had CT A/P on 11/15/2022 that showed no evidence of nephrolithiasis  Today reports she has had progressive and worsening left lower quadrant pain Constant, making it hard to move around No vaginal bleeding or discharge or leaking fluid No burning or pain with urination No constipation No fevers   --/--/A POS (06/26 1247)  OB History     Gravida  4   Para  1   Term  1   Preterm  0   AB  2   Living  1      SAB  2   IAB  0   Ectopic  0   Multiple  0   Live Births  1           Past Medical History:  Diagnosis Date   Abnormal uterine bleeding    Acid reflux    Alpha thalassemia trait    Amenorrhea    Anemia    no current med.   Asthma    Back pain    Chest pain    resolved   Complication of anesthesia    states had to keep giving her anesthesia during EGD   Constipation    Depression    "I'm good"   Fibromyalgia    Finger mass, left 03/2017   middle finger   Gestational hypertension 09/23/2019   Guidelines for Antenatal Testing and Sonography  (with updated ICD-10 codes)  Updated  09/27/19 with Dr. Noralee Space  INDICATION U/S 2 X week NST/AFI  or full BPP wkly DELIVERY  Diabetes   A1 - good control - O24.410    A2 - good control - O24.419      A2  - poor control or poor compliance - O24.419, E11.65   (Macrosomia or polyhydramnios) **E11.65 is extra code for poor control**    A2/B - O24.   Headache    Hepatitis 07/18/2022   Hepatitis B   History of kidney stones    Hypertension    Irritable bowel syndrome (IBS)    Joint pain    Morbid obesity with body mass index (BMI) of 45.0 to 49.9 in adult [facility])    Plantar fasciitis, bilateral    resolved   Polycystic ovary syndrome    Pre-diabetes    no meds, diet controlled, diet not check blood sugar   Shortness of breath    albuterol inhaler   Sleep apnea    no CPAP use   Vertigo 2017    Past Surgical History:  Procedure Laterality Date   CESAREAN SECTION N/A 10/09/2019   Procedure: CESAREAN SECTION;  Surgeon: Kiln Bing, MD;  Location: [facility];  Service: Obstetrics;  Laterality: N/A;   COLONOSCOPY WITH PROPOFOL  10/07/2016   DILATION AND CURETTAGE OF UTERUS     ESOPHAGOGASTRODUODENOSCOPY  12/21/2015   EXCISION MASS UPPER EXTREMETIES Left 04/21/2017   Procedure: EXCISION MASS LEFT MIDDLE FINGER;  Surgeon: Cindee Salt, MD;  Location: [facility];  Service: Orthopedics;  Laterality: Left;   gastric sleeve  05/09/2022   IR FL GUIDED LOC OF NEEDLE/CATH TIP FOR SPINAL INJECTION RT  03/13/2020   KNEE ARTHROSCOPY WITH LATERAL MENISECTOMY Left 10/31/2022   Procedure: KNEE ARTHROSCOPY WITH PARTIAL LATERAL MENISECTOMY;  Surgeon: Yolonda Kida, MD;  Location: [facility];  Service: Orthopedics;  Laterality: Left;  77   KNEE ARTHROSCOPY WITH LATERAL RELEASE Left 10/31/2022   Procedure: KNEE ARTHROSCOPY WITH LATERAL RELEASE AND LIMITED SYNOVECTOMY;  Surgeon: Yolonda Kida, MD;  Location: [facility]S;  Service: Orthopedics;  Laterality: Left;  77   PHOTOCOAGULATION WITH LASER Left 12/13/2020   Procedure: RETINOPLEXY LEFT EYE;  Surgeon: Stephannie Li, MD;  Location: [facility] OR;  Service:  Ophthalmology;  Laterality: Left;   SCLERAL BUCKLE Right 12/13/2020   Procedure: SCLERAL BUCKLE WITH CRYO;  Surgeon: Stephannie Li, MD;  Location: [facility] OR;  Service: Ophthalmology;  Laterality: Right;    Family History  Problem Relation Age of Onset   Diabetes Maternal Aunt    Hypertension Maternal Uncle    Depression Maternal Grandfather    Diabetes Maternal Grandfather    Hypertension Maternal Grandfather    Arthritis Mother    High blood pressure Mother    Depression Mother    Sleep apnea Mother    Obesity Mother    Diabetes Mother    Hypertension Mother    Kidney failure Mother    Sleep apnea Father    Obesity Father    Healthy Daughter     Social History   Socioeconomic History   Marital status: Single    Spouse name: Not on file   Number of children: Not on file   Years of education: Not on file   Highest education level: Not on file  Occupational History   Occupation: IT sales professional    Employer: [employer]  Tobacco Use   Smoking status: Never   Smokeless tobacco: Never  Vaping Use   Vaping Use: Never used  Substance and Sexual Activity   Alcohol use: Not Currently    Comment: social    Drug use: No   Sexual activity: Yes    Birth control/protection: None  Other Topics Concern   Not on file  Social History Narrative   Right handed    Soda sometimes   Lives with mom and cousin, a grandmother in [place] also helps care for her.       She is in nursing school.    Social Determinants of Health   Financial Resource Strain: Not on file  Food Insecurity: Not on file  Transportation Needs: No Transportation Needs (11/18/2022)   PRAPARE - Administrator, Civil Service (Medical): No    Lack of Transportation (Non-Medical): No  Physical Activity: Not on file  Stress: Not on file  Social Connections: Not on file  Intimate Partner Violence: Not on file    Allergies  Allergen Reactions   Bee Venom Hives, Shortness Of Breath and Swelling    Hydrocodone-Acetaminophen Anaphylaxis    No problem when she takes Tylenol   Peach Flavor Hives, Shortness Of Breath and Other (See Comments)    SWELLING OF MOUTH   Pollen  Extract Hives, Shortness Of Breath and Swelling   Remdesivir Swelling    Angioedema 8/25   Latex Rash   Cortisone Itching and Swelling   Cortizone-10 [Hydrocortisone]     Swelling and itching   Toradol [Ketorolac Tromethamine] Swelling    No current facility-administered medications on file prior to encounter.   Current Outpatient Medications on File Prior to Encounter  Medication Sig Dispense Refill   acetaminophen-caffeine (EXCEDRIN TENSION HEADACHE) 500-65 MG TABS per tablet Take 1 tablet by mouth every 6 (six) hours as needed. 30 tablet 0   fluticasone (FLONASE) 50 MCG/ACT nasal spray Place 2 sprays into both nostrils daily. 16 g 6   loratadine (CLARITIN) 10 MG tablet Take 10 mg by mouth daily as needed for allergies.      ondansetron (ZOFRAN) 4 MG tablet Take 1 tablet (4 mg total) by mouth every 8 (eight) hours as needed. 20 tablet 0   Prenatal Vit-Fe Phos-FA-Omega (VITAFOL GUMMIES) 3.33-0.333-34.8 MG CHEW Chew 1 tablet by mouth daily. 90 tablet 3     ROS Pertinent positives and negative per HPI, all others reviewed and negative  Physical Exam   BP 125/73 (BP Location: Right Arm)   Pulse 72   Temp 98.2 F (36.8 C) (Oral)   Resp 20   Ht 6\' 2"  (1.88 m)   Wt (!) 180.4 kg   SpO2 98%   BMI 51.07 kg/m   Patient Vitals for the past 24 hrs:  BP Temp Temp src Pulse Resp SpO2 Height Weight  04/15/23 1205 125/73 98.2 F (36.8 C) Oral 72 20 98 % 6\' 2"  (1.88 m) (!) 180.4 kg    Physical Exam Vitals reviewed.  Constitutional:      General: She is not in acute distress.    Appearance: She is well-developed. She is not diaphoretic.  Eyes:     General: No scleral icterus. Pulmonary:     Effort: Pulmonary effort is normal. No respiratory distress.  Abdominal:     General: There is no distension.      Palpations: Abdomen is soft.     Tenderness: There is abdominal tenderness. There is no guarding or rebound.     Comments: Moderate ttp in the llq  Skin:    General: Skin is warm and dry.  Neurological:     Mental Status: She is alert.     Coordination: Coordination normal.      Cervical Exam    Bedside Ultrasound Not performed.  My interpretation: n/a   Labs Results for orders placed or performed during the hospital encounter of 04/15/23 (from the past 24 hour(s))  CBC     Status: Abnormal   Collection Time: 04/15/23 12:47 PM  Result Value Ref Range   WBC 6.2 4.0 - 10.5 K/uL   RBC 4.48 3.87 - 5.11 MIL/uL   Hemoglobin 11.1 (L) 12.0 - 15.0 g/dL   HCT 65.7 (L) 84.6 - 96.2 %   MCV 79.2 (L) 80.0 - 100.0 fL   MCH 24.8 (L) 26.0 - 34.0 pg   MCHC 31.3 30.0 - 36.0 g/dL   RDW 95.2 84.1 - 32.4 %   Platelets 213 150 - 400 K/uL   nRBC 0.0 0.0 - 0.2 %  Comprehensive metabolic panel     Status: Abnormal   Collection Time: 04/15/23 12:47 PM  Result Value Ref Range   Sodium 139 135 - 145 mmol/L   Potassium 4.1 3.5 - 5.1 mmol/L   Chloride 106 98 - 111 mmol/L   CO2  27 22 - 32 mmol/L   Glucose, Bld 94 70 - 99 mg/dL   BUN 5 (L) 6 - 20 mg/dL   Creatinine, Ser 1.61 0.44 - 1.00 mg/dL   Calcium 8.6 (L) 8.9 - 10.3 mg/dL   Total Protein 6.2 (L) 6.5 - 8.1 g/dL   Albumin 3.2 (L) 3.5 - 5.0 g/dL   AST 14 (L) 15 - 41 U/L   ALT 13 0 - 44 U/L   Alkaline Phosphatase 60 38 - 126 U/L   Total Bilirubin 0.7 0.3 - 1.2 mg/dL   GFR, Estimated >09 >60 mL/min   Anion gap 6 5 - 15  hCG, quantitative, pregnancy     Status: Abnormal   Collection Time: 04/15/23 12:47 PM  Result Value Ref Range   hCG, Beta Chain, Quant, S 6,673 (H) <5 mIU/mL  Type and screen [facility]     Status: None   Collection Time: 04/15/23 12:47 PM  Result Value Ref Range   ABO/RH(D) A POS    Antibody Screen NEG    Sample Expiration      04/18/2023,2359 Performed at [facility], [address]  [place], [place] [address]     Imaging [name] Transvaginal  Result Date: 04/15/2023 CLINICAL DATA:  Left lower quadrant pain.  Pregnant EXAM: TRANSVAGINAL OB ULTRASOUND TECHNIQUE: Transvaginal ultrasound was performed for complete evaluation of the gestation as well as the maternal uterus, adnexal regions, and pelvic cul-de-sac. COMPARISON:  Ultrasound 04/06/2023 FINDINGS: Intrauterine gestational sac: Possible Yolk sac:  Not Visualized. Embryo:  Not Visualized. Cardiac Activity: Not Visualized. MSD: 7.1 mm   5 w   3 d Maternal uterus/adnexae: No free fluid in the maternal pelvis. Right ovary measures 4.4 x 2.6 by 2.5 cm. Left ovary 2.6 x 1.9 by 1.8 cm. Images are somewhat limited by overlapping bowel gas and soft tissue. IMPRESSION: Small fluid collection along the endometrium. It is possible this is an early IUP with a gestational sac but indeterminate on the available images. No fetal pole or yolk sac. Please correlate with patient's beta HCG and recommend short follow-up in 2 days to exclude ectopic. No free fluid. Electronically Signed   By: Karen Kays M.D.   On: 04/15/2023 13:18    MAU Course  Procedures Lab Orders         CBC         Comprehensive metabolic panel         hCG, quantitative, pregnancy    Meds ordered this encounter  Medications   oxyCODONE (Oxy IR/ROXICODONE) immediate release tablet 10 mg   Imaging Orders         US OB Transvaginal     MDM Moderate (Level 3-4)  Assessment and Plan  #Abdominal pain in pregnancy, first trimester #?5? Weeks gestation of pregnancy #Pregnancy of uncertain viability US shows IUGS, no free fluid in the pelvis, no abnormal adnexal findings. At this point etiology of pain not totally clear but feel comfortable saying there is no evidence of an ectopic. Would like to repeat hcg in two days to ensure still a normal pregnancy, will coordinate with [facility]. We discussed return precautions including crescendo abdominal pain, heavy vaginal bleeding  soaking >1 pad/hour, and fever.   Dispo: discharged to home in stable condition    Venora Maples, MD/MPH 04/15/23 2:21 PM  Allergies as of 04/15/2023       Reactions   Bee Venom Hives, Shortness Of Breath, Swelling   Hydrocodone-acetaminophen Anaphylaxis  No problem when she takes Tylenol   Estate agent, Shortness Of Breath, Other (See Comments)   SWELLING OF MOUTH   Pollen Extract Hives, Shortness Of Breath, Swelling   Remdesivir Swelling   Angioedema 8/25   Latex Rash   Cortisone Itching, Swelling   Cortizone-10 [hydrocortisone]    Swelling and itching   Toradol [ketorolac Tromethamine] Swelling        Medication List     TAKE these medications    acetaminophen-caffeine 500-65 MG Tabs per tablet Commonly known as: EXCEDRIN TENSION HEADACHE Take 1 tablet by mouth every 6 (six) hours as needed.   fluticasone 50 MCG/ACT nasal spray Commonly known as: FLONASE Place 2 sprays into both nostrils daily.   loratadine 10 MG tablet Commonly known as: CLARITIN Take 10 mg by mouth daily as needed for allergies.   ondansetron 4 MG tablet Commonly known as: Zofran Take 1 tablet (4 mg total) by mouth every 8 (eight) hours as needed.   Vitafol Gummies 3.33-0.333-34.8 MG Chew Chew 1 tablet by mouth daily.

## 2023-04-15 NOTE — MAU Note (Addendum)
...  Maureen Lewis is a 27 y.o. at Unknown here in MAU reporting: Ongoing pelvic pain that has worsened and is now accompanied by left lower back pain. She reports when she was seen in MAU her pain was mainly on the left side of her pelvis and was intermittent. She reports for the past two days the pain has become constant. She reports movement and walking increases her pain. She reports she called out of work today due to her pain. Denies VB or vaginal discharge. Denies urinary sx's. Last IC last night. Reports this made her pain worse.  Pain meds at home: 1000 mg of Tylenol at 0700  US IMPRESSION on 6/17: There is no demonstrable intrauterine gestational sac. There is 1.3 cm complex cyst in the right adnexa, possibly hemorrhagic follicle. No dominant adnexal masses are seen. Small amount of free fluid is seen in pelvis.  hCG trends: 6/17 113 6/19 241  LMP: unsure - potentially 03/07/2023  Pain scores:  8/10 left lower back 8/10 left pelvis  Lab orders placed from triage:  none

## 2023-04-16 ENCOUNTER — Telehealth: Payer: Self-pay

## 2023-04-16 NOTE — Telephone Encounter (Signed)
Nurse visit scheduled for tomorrow, 04/17/23 at 0940 per Crissie Reese MD; pt needs repeat HCG lab.

## 2023-04-17 ENCOUNTER — Ambulatory Visit: Payer: Medicaid Other

## 2023-04-17 ENCOUNTER — Inpatient Hospital Stay (HOSPITAL_COMMUNITY)
Admission: AD | Admit: 2023-04-17 | Discharge: 2023-04-17 | Disposition: A | Discharge status: Home / Self Care | Payer: Medicaid Other | Attending: Obstetrics and Gynecology | Admitting: Obstetrics and Gynecology

## 2023-04-17 ENCOUNTER — Other Ambulatory Visit: Payer: Self-pay

## 2023-04-17 ENCOUNTER — Encounter (HOSPITAL_COMMUNITY): Payer: Self-pay | Admitting: Obstetrics and Gynecology

## 2023-04-17 ENCOUNTER — Inpatient Hospital Stay (HOSPITAL_COMMUNITY): Payer: Medicaid Other

## 2023-04-17 DIAGNOSIS — O209 Hemorrhage in early pregnancy, unspecified: Secondary | ICD-10-CM | POA: Diagnosis not present

## 2023-04-17 DIAGNOSIS — Z3A01 Less than 8 weeks gestation of pregnancy: Secondary | ICD-10-CM | POA: Insufficient documentation

## 2023-04-17 DIAGNOSIS — O26891 Other specified pregnancy related conditions, first trimester: Secondary | ICD-10-CM | POA: Diagnosis not present

## 2023-04-17 DIAGNOSIS — O26899 Other specified pregnancy related conditions, unspecified trimester: Secondary | ICD-10-CM | POA: Diagnosis not present

## 2023-04-17 DIAGNOSIS — O3680X0 Pregnancy with inconclusive fetal viability, not applicable or unspecified: Secondary | ICD-10-CM | POA: Diagnosis not present

## 2023-04-17 DIAGNOSIS — R1032 Left lower quadrant pain: Secondary | ICD-10-CM | POA: Diagnosis not present

## 2023-04-17 DIAGNOSIS — Z3A00 Weeks of gestation of pregnancy not specified: Secondary | ICD-10-CM | POA: Diagnosis not present

## 2023-04-17 LAB — URINALYSIS, ROUTINE W REFLEX MICROSCOPIC
Bacteria, UA: NONE SEEN
Bilirubin Urine: NEGATIVE
Glucose, UA: NEGATIVE mg/dL
Ketones, ur: NEGATIVE mg/dL
Leukocytes,Ua: NEGATIVE
Nitrite: NEGATIVE
Protein, ur: NEGATIVE mg/dL
Specific Gravity, Urine: 1.017 (ref 1.005–1.030)
pH: 6 (ref 5.0–8.0)

## 2023-04-17 LAB — HCG, QUANTITATIVE, PREGNANCY: hCG, Beta Chain, Quant, S: 10643 m[IU]/mL — ABNORMAL HIGH (ref <5)

## 2023-04-17 MED ORDER — OXYCODONE HCL 5 MG PO TABS
5.0000 mg | ORAL_TABLET | Freq: Once | ORAL | Status: AC
  Administered 2023-04-17: 5 mg via ORAL
  Filled 2023-04-17: qty 1

## 2023-04-17 NOTE — MAU Provider Note (Addendum)
Chief Complaint: Abdominal Pain and Vaginal Bleeding   Event Date/Time   First Provider Initiated Contact with Patient 04/17/23 0709        SUBJECTIVE HPI: Maureen Lewis is a 27 y.o. [id number] at Unknown by LMP who presents to maternity admissions reporting left lower quadrant pain, worse than several days ago.  Has had more bleeding today. .Did have intercourse last night, despite being advised to observe pelvic rest 2 days ago. She denies urinary symptoms, h/a, dizziness, n/v, or fever/chills.    Was seen here 2 days ago for similar symptoms.   HCG levels had risen but only a small fluid collection was seen in uterus.  They recommended repeat HCG today.   Abdominal Pain This is a recurrent problem. The problem has been unchanged. The pain is located in the LLQ. The quality of the pain is cramping. Pertinent negatives include no constipation, diarrhea or dysuria. Nothing aggravates the pain. The pain is relieved by Nothing. She has tried nothing for the symptoms.  Vaginal Bleeding The patient's primary symptoms include pelvic pain and vaginal bleeding. The patient's pertinent negatives include no genital odor. This is a recurrent problem. Associated symptoms include abdominal pain. Pertinent negatives include no constipation, diarrhea or dysuria. The vaginal discharge was bloody. The vaginal bleeding is lighter than menses. She has not been passing clots. She has not been passing tissue.   RN Note: Maureen Lewis is a 27 y.o. at Unknown here in MAU reporting: woke up this morning in a lot of pain - lower left sided abdominal pain that is sharp and constant. went to the bathroom and noticed she was bleeding "like the beginning of a period". Reports last intercourse was yesterday.   Onset of complaint: 0515                 Pain score: 9  Past Medical History:  Diagnosis Date   Abnormal uterine bleeding    Acid reflux    Alpha thalassemia trait    Amenorrhea    Anemia     no current med.   Asthma    Back pain    Chest pain    resolved   Complication of anesthesia    states had to keep giving her anesthesia during EGD   Constipation    Depression    "I'm good"   Fibromyalgia    Finger mass, left 03/2017   middle finger   Gestational hypertension 09/23/2019   Guidelines for Antenatal Testing and Sonography  (with updated ICD-10 codes)  Updated  2019-09-16 with Dr. Noralee Space  INDICATION U/S 2 X week NST/AFI  or full BPP wkly DELIVERY Diabetes   A1 - good control - O24.410    A2 - good control - O24.419      A2  - poor control or poor compliance - O24.419, E11.65   (Macrosomia or polyhydramnios) **E11.65 is extra code for poor control**    A2/B - O24.   Headache    Hepatitis 07/18/2022   Hepatitis B   History of kidney stones    Hypertension    Irritable bowel syndrome (IBS)    Joint pain    Morbid obesity with body mass index (BMI) of 45.0 to 49.9 in adult [facility])    Plantar fasciitis, bilateral    resolved   Polycystic ovary syndrome    Pre-diabetes    no meds, diet controlled, diet not check blood sugar   Shortness of breath  albuterol inhaler   Sleep apnea    no CPAP use   Vertigo 2017   Past Surgical History:  Procedure Laterality Date   CESAREAN SECTION N/A 10/09/2019   Procedure: CESAREAN SECTION;  Surgeon: Salem Bing, MD;  Location: [facility];  Service: Obstetrics;  Laterality: N/A;   COLONOSCOPY WITH PROPOFOL  10/07/2016   DILATION AND CURETTAGE OF UTERUS     ESOPHAGOGASTRODUODENOSCOPY  12/21/2015   EXCISION MASS UPPER EXTREMETIES Left 04/21/2017   Procedure: EXCISION MASS LEFT MIDDLE FINGER;  Surgeon: Cindee Salt, MD;  Location: [facility];  Service: Orthopedics;  Laterality: Left;   gastric sleeve  05/09/2022   IR FL GUIDED LOC OF NEEDLE/CATH TIP FOR SPINAL INJECTION RT  03/13/2020   KNEE ARTHROSCOPY WITH LATERAL MENISECTOMY Left 10/31/2022   Procedure: KNEE ARTHROSCOPY WITH PARTIAL LATERAL MENISECTOMY;   Surgeon: Yolonda Kida, MD;  Location: [facility] ORS;  Service: Orthopedics;  Laterality: Left;  75   KNEE ARTHROSCOPY WITH LATERAL RELEASE Left 10/31/2022   Procedure: KNEE ARTHROSCOPY WITH LATERAL RELEASE AND LIMITED SYNOVECTOMY;  Surgeon: Yolonda Kida, MD;  Location: [facility] ORS;  Service: Orthopedics;  Laterality: Left;  75   PHOTOCOAGULATION WITH LASER Left 12/13/2020   Procedure: RETINOPLEXY LEFT EYE;  Surgeon: Stephannie Li, MD;  Location: [facility] OR;  Service: Ophthalmology;  Laterality: Left;   SCLERAL BUCKLE Right 12/13/2020   Procedure: SCLERAL BUCKLE WITH CRYO;  Surgeon: Stephannie Li, MD;  Location: [facility] OR;  Service: Ophthalmology;  Laterality: Right;   Social History   Socioeconomic History   Marital status: Single    Spouse name: Not on file   Number of children: Not on file   Years of education: Not on file   Highest education level: Not on file  Occupational History   Occupation: IT sales professional    Employer: [employer]  Tobacco Use   Smoking status: Never   Smokeless tobacco: Never  Vaping Use   Vaping Use: Never used  Substance and Sexual Activity   Alcohol use: Not Currently    Comment: social    Drug use: No   Sexual activity: Yes    Birth control/protection: None  Other Topics Concern   Not on file  Social History Narrative   Right handed    Soda sometimes   Lives with mom and cousin, a grandmother in [place] also helps care for her.       She is in nursing school.    Social Determinants of Health   Financial Resource Strain: Not on file  Food Insecurity: Not on file  Transportation Needs: No Transportation Needs (11/18/2022)   PRAPARE - Administrator, Civil Service (Medical): No    Lack of Transportation (Non-Medical): No  Physical Activity: Not on file  Stress: Not on file  Social Connections: Not on file  Intimate Partner Violence: Not on file   No current facility-administered medications on file prior to encounter.   Current  Outpatient Medications on File Prior to Encounter  Medication Sig Dispense Refill   loratadine (CLARITIN) 10 MG tablet Take 10 mg by mouth daily as needed for allergies.      ondansetron (ZOFRAN) 4 MG tablet Take 1 tablet (4 mg total) by mouth every 8 (eight) hours as needed. 20 tablet 0   Prenatal Vit-Fe Phos-FA-Omega (VITAFOL GUMMIES) 3.33-0.333-34.8 MG CHEW Chew 1 tablet by mouth daily. 90 tablet 3   acetaminophen-caffeine (EXCEDRIN TENSION HEADACHE) 500-65 MG TABS per tablet Take 1 tablet by mouth every  6 (six) hours as needed. 30 tablet 0   fluticasone (FLONASE) 50 MCG/ACT nasal spray Place 2 sprays into both nostrils daily. 16 g 6   Allergies  Allergen Reactions   Bee Venom Hives, Shortness Of Breath and Swelling   Hydrocodone-Acetaminophen Anaphylaxis    No problem when she takes Tylenol   Peach Flavor Hives, Shortness Of Breath and Other (See Comments)    SWELLING OF MOUTH   Pollen Extract Hives, Shortness Of Breath and Swelling   Remdesivir Swelling    Angioedema 8/25   Latex Rash   Cortisone Itching and Swelling   Cortizone-10 [Hydrocortisone]     Swelling and itching   Toradol [Ketorolac Tromethamine] Swelling    I have reviewed patient's Past Medical Hx, Surgical Hx, Family Hx, Social Hx, medications and allergies.   ROS:  Review of Systems  Gastrointestinal:  Positive for abdominal pain. Negative for constipation and diarrhea.  Genitourinary:  Positive for pelvic pain and vaginal bleeding. Negative for dysuria.   Review of Systems  Other systems negative   Physical Exam  Physical Exam Patient Vitals for the past 24 hrs:  BP Temp Temp src Pulse Resp SpO2 Height Weight  04/17/23 0634 134/81 98.9 F (37.2 C) Oral 91 18 98 % 6\' 2"  (1.88 m) (!) 179.4 kg   Constitutional: Well-developed, well-nourished female in no acute distress.  Cardiovascular: normal rate Respiratory: normal effort GI: Abd soft, non-tender. MS: Extremities nontender, no edema, normal  ROM Neurologic: Alert and oriented x 4.  GU: Neg CVAT.  PELVIC EXAM: deferred due to recent US  LAB RESULTS Pending HCG level  --/--/A POS (06/26 1247)  IMAGING   MAU Management/MDM: I have reviewed the triage vital signs and the nursing notes.   Pertinent labs & imaging results that were available during my care of the patient were reviewed by me and considered in my medical decision making (see chart for details).      I have reviewed her medical records including past results, notes and treatments. Medical, Surgical, and family history were reviewed.  Medications and recent lab tests were reviewed  Ordered repeat HCG level   Too early to repeat US..  Treatments in MAU included oxycodone per pt request for pain (states it helped 2 days ago when she was here).   This bleeding/pain can represent a normal pregnancy with bleeding, spontaneous abortion or even an ectopic which can be life-threatening.  The process as listed above helps to determine which of these is present.   ASSESSMENT Pregnancy at early gestation Left lower abdominal pain Bleeding  Pregnancy of unknown location  PLAN Care turned over to oncoming provider   Wynelle Bourgeois CNM, MSN Certified Nurse-Midwife 04/17/2023  9:18 AM   Results for orders placed or performed during the hospital encounter of 04/17/23 (from the past 24 hour(s))  hCG, quantitative, pregnancy     Status: Abnormal   Collection Time: 04/17/23  6:42 AM  Result Value Ref Range   hCG, Beta Chain, Quant, S 10,643 (H) <5 mIU/mL  Urinalysis, Routine w reflex microscopic -Urine, Clean Catch     Status: Abnormal   Collection Time: 04/17/23  6:48 AM  Result Value Ref Range   Color, Urine YELLOW YELLOW   APPearance CLEAR CLEAR   Specific Gravity, Urine 1.017 1.005 - 1.030   pH 6.0 5.0 - 8.0   Glucose, UA NEGATIVE NEGATIVE mg/dL   Hgb urine dipstick SMALL (A) NEGATIVE   Bilirubin Urine NEGATIVE NEGATIVE   Ketones, ur NEGATIVE  NEGATIVE  mg/dL   Protein, ur NEGATIVE NEGATIVE mg/dL   Nitrite NEGATIVE NEGATIVE   Leukocytes,Ua NEGATIVE NEGATIVE   RBC / HPF 0-5 0 - 5 RBC/hpf   WBC, UA 0-5 0 - 5 WBC/hpf   Bacteria, UA NONE SEEN NONE SEEN   Squamous Epithelial / HPF 0-5 0 - 5 /HPF   US OB Transvaginal  Result Date: 04/17/2023 CLINICAL DATA:  Left lower quadrant abdominal pain EXAM: TRANSVAGINAL OB ULTRASOUND TECHNIQUE: Transvaginal ultrasound was performed for complete evaluation of the gestation as well as the maternal uterus, adnexal regions, and pelvic cul-de-sac. COMPARISON:  Obstetric ultrasound dated 04/15/2023 FINDINGS: Intrauterine gestational sac: Single Yolk sac:  Not Visualized. Embryo:  Not Visualized. Cardiac Activity: Not Visualized. Heart Rate: NA MSD: 7.2 mm   5 w   3 d CRL: N/A Subchorionic hemorrhage:  None visualized. Maternal uterus/adnexae: Normal right ovary. Left ovary is not seen. IMPRESSION: 1. Probable early intrauterine gestational sac, but no yolk sac, fetal pole, or cardiac activity yet visualized. Recommend follow-up quantitative B-HCG levels and follow-up US in 14 days to assess viability. This recommendation follows SRU consensus guidelines: Diagnostic Criteria for Nonviable Pregnancy Early in the First Trimester. Malva Limes Med 2013; 161:0960-45. 2. Normal right ovary.  Left ovary is not seen. Electronically Signed   By: Agustin Cree M.D.   On: 04/17/2023 08:28    Assumed care of patient at 0800. Appropriate rise in HCG. IUGS noted on ultrasound. Discussed labs and ultrasound results with patient. Patient has ultrasound scheduled on July 8th for viability at [facility]   Assessment/Plan:  1. [redacted] weeks gestation of pregnancy   2. Vaginal bleeding affecting early pregnancy    - Discharge home in stable condition - Keep ultrasound appointment as scheduled at [facility]  - Pelvic rest and bleeding precautions. Return to MAU as needed  Brand Males, CNM 04/17/23, 9:18 AM

## 2023-04-17 NOTE — MAU Note (Signed)
.  Maureen Lewis is a 27 y.o. at Unknown here in MAU reporting: woke up this morning in a lot of pain - lower left sided abdominal pain that is sharp and constant. went to the bathroom and noticed she was bleeding "like the beginning of a period". Reports last intercourse was yesterday.   Onset of complaint: 0515 Pain score: 9 Vitals:   04/17/23 0634  BP: 134/81  Pulse: 91  Resp: 18  Temp: 98.9 F (37.2 C)  SpO2: 98%     FHT: NA  Lab orders placed from triage:  UA

## 2023-04-20 DIAGNOSIS — F439 Reaction to severe stress, unspecified: Secondary | ICD-10-CM | POA: Diagnosis not present

## 2023-04-27 ENCOUNTER — Ambulatory Visit (INDEPENDENT_AMBULATORY_CARE_PROVIDER_SITE_OTHER): Payer: Medicaid Other

## 2023-04-27 DIAGNOSIS — Z3481 Encounter for supervision of other normal pregnancy, first trimester: Secondary | ICD-10-CM | POA: Diagnosis not present

## 2023-04-27 DIAGNOSIS — Z3A01 Less than 8 weeks gestation of pregnancy: Secondary | ICD-10-CM | POA: Diagnosis not present

## 2023-04-27 DIAGNOSIS — O3680X0 Pregnancy with inconclusive fetal viability, not applicable or unspecified: Secondary | ICD-10-CM

## 2023-04-28 DIAGNOSIS — Z3A01 Less than 8 weeks gestation of pregnancy: Secondary | ICD-10-CM | POA: Diagnosis not present

## 2023-04-28 DIAGNOSIS — Z903 Acquired absence of stomach [part of]: Secondary | ICD-10-CM | POA: Diagnosis not present

## 2023-04-28 DIAGNOSIS — Z6841 Body Mass Index (BMI) 40.0 and over, adult: Secondary | ICD-10-CM | POA: Diagnosis not present

## 2023-04-29 DIAGNOSIS — F439 Reaction to severe stress, unspecified: Secondary | ICD-10-CM | POA: Diagnosis not present

## 2023-04-30 ENCOUNTER — Telehealth (INDEPENDENT_AMBULATORY_CARE_PROVIDER_SITE_OTHER): Payer: Medicaid Other | Admitting: *Deleted

## 2023-04-30 DIAGNOSIS — O99210 Obesity complicating pregnancy, unspecified trimester: Secondary | ICD-10-CM

## 2023-04-30 DIAGNOSIS — Z3689 Encounter for other specified antenatal screening: Secondary | ICD-10-CM

## 2023-04-30 DIAGNOSIS — O09299 Supervision of pregnancy with other poor reproductive or obstetric history, unspecified trimester: Secondary | ICD-10-CM

## 2023-04-30 DIAGNOSIS — Z98891 History of uterine scar from previous surgery: Secondary | ICD-10-CM

## 2023-04-30 DIAGNOSIS — O0990 Supervision of high risk pregnancy, unspecified, unspecified trimester: Secondary | ICD-10-CM

## 2023-04-30 DIAGNOSIS — Z8759 Personal history of other complications of pregnancy, childbirth and the puerperium: Secondary | ICD-10-CM

## 2023-04-30 DIAGNOSIS — O219 Vomiting of pregnancy, unspecified: Secondary | ICD-10-CM

## 2023-04-30 DIAGNOSIS — Z8632 Personal history of gestational diabetes: Secondary | ICD-10-CM

## 2023-04-30 MED ORDER — GOJJI WEIGHT SCALE MISC
1.0000 | 0 refills | Status: DC
Start: 2023-04-30 — End: 2023-05-07

## 2023-04-30 MED ORDER — BLOOD PRESSURE KIT DEVI
1.0000 | 0 refills | Status: DC | PRN
Start: 2023-04-30 — End: 2023-12-04

## 2023-04-30 MED ORDER — PROMETHAZINE HCL 25 MG PO TABS
25.0000 mg | ORAL_TABLET | Freq: Four times a day (QID) | ORAL | 0 refills | Status: DC | PRN
Start: 2023-04-30 — End: 2023-06-01

## 2023-04-30 NOTE — Patient Instructions (Signed)
Options for Doula Care in the [place]  As you review your birthing options, consider having a birth doula. A doula is trained to provide support before, during and just after you give birth. There are also postpartum doulas that help you adjust to new parenthood.  While doulas do not provide medical care, they do provide emotional, physical and educational support. A few months before your baby arrives, doulas can help answer questions, ease concerns and help you create and support your birthing plan.    Doulas can help reduce your stress and comfort you and your partner. They can help you cope with labor by helping you use breathing techniques, massage, creative labor positioning, essential oils and affirmations.   Studies show that the benefits of having a doula include:   A more positive birth experience  Fewer requests for pain-relief medication  Less likelihood of cesarean section, commonly called a c-section   Doulas are typically hired via a fee and contract between you and the doula. We are happy to provide a list of the most active doulas in the area, all of whom are credentialed by [facility] and will not count as a visitor at your birth.  There are several options for no-cost doula care at our hospital, including:  [facility] Volunteer Doula Program Every Baby [place] Doula Program A Cure 4 Moms Doula Study (available only at [facility], [facility], [place] and [place] [facility] offices)  For more information on these programs or to receive a list of doulas active in our area, please email [email or web address]         

## 2023-04-30 NOTE — Progress Notes (Signed)
New OB Intake  I connected with Emanuela Nandia Renee Rone  on 04/30/23 at 10:22 by MyChart Video Visit and verified that I am speaking with the correct person using two identifiers. Nurse is located at [facility] and pt is located at work.  I discussed the limitations, risks, security and privacy concerns of performing an evaluation and management service by telephone and the availability of in person appointments. I also discussed with the patient that there may be a patient responsible charge related to this service. The patient expressed understanding and agreed to proceed.  I explained I am completing New OB Intake today. We discussed EDD of Not found.. Pt is [id number]. I reviewed her allergies, medications and Medical/Surgical/OB history.  She states Zofran not helping nausea. Discussed options and new rx sent in for Phenergan.She c/o headaches and we discussed may take tylenol as needed, discuss with provider at new ob visit if needed.   Patient Active Problem List   Diagnosis Date Noted   Supervision of high risk pregnancy, antepartum 04/30/2023   History of gestational diabetes mellitus (GDM) in prior pregnancy, currently pregnant 04/30/2023   Obesity in pregnancy 04/30/2023   History of gestational hypertension 04/30/2023   History of C-section 04/30/2023   Levator spasm 11/21/2020   Galactorrhea 11/19/2020   Hx of migraine headaches 04/09/2020   Positive RPR test 10/09/2019   HTN (hypertension) 09/23/2019   Migraine headache 07/12/2019   Alpha thalassemia trait    Vitamin D deficiency 09/16/2017   Prediabetes 09/16/2017   Depression 09/16/2017   Class 3 severe obesity with serious comorbidity and body mass index (BMI) of 50.0 to 59.9 in adult [facility]) 09/16/2017   PCOS (polycystic ovarian syndrome) 08/13/2017   Mass 05/06/2017   Vertigo 10/06/2016   Microcytic anemia 09/26/2016   Obesity hypoventilation syndrome (HCC) 08/28/2016   Benign paroxysmal positional vertigo due to  bilateral vestibular disorder 08/28/2016   OSA (obstructive sleep apnea) 08/28/2016   Hypersomnia with sleep apnea 08/28/2016   Female hirsutism 08/28/2016   GERD (gastroesophageal reflux disease) 02/24/2015   Constipation 10/02/2014    Concerns addressed today  Delivery Plans Plans to deliver at [facility]. Discussed the nature of our practice with multiple providers including residents and students. Due to the size of the practice, the delivering provider may not be the same as those providing prenatal care.   Patient is interested in water birth. Offered upcoming OB visit with CNM to discuss further.  MyChart/Babyscripts MyChart access verified. I explained pt will have some visits in office and some virtually. Babyscripts instructions given and order placed.   Blood Pressure Cuff/Weight Scale Blood pressure cuff ordered for patient to pick-up from [employer]. Explained after first prenatal appt pt will check weekly and document in Babyscripts. Patient does not have weight scale; order sent to [facility], patient may track weight weekly in Babyscripts.  Anatomy US Explained first scheduled [name] will be around 19 weeks. Anatomy US scheduled for 07/30/23 at 0715.  Is patient a CenteringPregnancy candidate?  Accepted  Is patient a Mom+Baby Combined Care candidate?  Not a candidate    Interested in [place]? yes, sent referral and doula dot phrase.    First visit review I reviewed new OB appt with patient. Explained pt will be seen by Dr. Para March at first visit. Discussed Avelina Laine genetic screening with patient. Would like both Panorama and Horizon drawn with routine prenatal labs at new ob visit.    Last Pap Diagnosis  Date Value Ref Range  Status  04/11/2020   Final   - Negative for intraepithelial lesion or malignancy (NILM)    Nancy Fetter 04/30/2023  11:12 AM

## 2023-05-01 ENCOUNTER — Encounter: Payer: Self-pay | Admitting: Adult Health

## 2023-05-05 DIAGNOSIS — F439 Reaction to severe stress, unspecified: Secondary | ICD-10-CM | POA: Diagnosis not present

## 2023-05-06 NOTE — Progress Notes (Signed)
History:   Maureen Lewis is a 27 y.o. [id number] at [redacted]w[redacted]d by early ultrasound being seen today for her first obstetrical visit.   Patient does intend to breast feed.   Pregnancy history fully reviewed.   Obstetrical history is significant for c-section in 2020 for decels. That pregnancy was complicated by [facility] and GDM and was IOL at 37w.   Patient reports  bariatric surgery since last pregnancy (gastric sleeve). She does report daily headaches with dizziness . Takes tylenol without relief. Has N/V of pregnancy. Just got phenergan today and will try that. Has done zofran and scop patch in the past and didn't help.       HISTORY: OB History  Gravida Para Term Preterm AB Living  4 1 1  0 2 1  SAB IAB Ectopic Multiple Live Births  2 0 0 0 1    # Outcome Date GA Lbr Len/2nd Weight Sex Type Anes PTL Lv  4 Current           3 SAB 2023             Birth Comments: had D&C  2 SAB 2021          1 Term 10/09/19 [redacted]w[redacted]d  7 lb 6.9 oz (3.37 kg) F CS-Vac EPI  LIV     Birth Comments: GHTN, GDM, IOL for GHTN, C/S for decels     Name: Maureen Lewis, Maureen Lewis     Apgar1: 8  Apgar5: 9     Last pap smear was done: Lab Results  Component Value Date   DIAGPAP  04/11/2020    - Negative for intraepithelial lesion or malignancy (NILM)   HPVHIGH Negative 04/11/2020     Past Medical History:  Diagnosis Date   Abnormal uterine bleeding    Acid reflux    Alpha thalassemia trait    Amenorrhea    Anemia    no current med.   Asthma    Back pain    Chest pain    resolved   Complication of anesthesia    states had to keep giving her anesthesia during EGD   Constipation    Depression    "I'm good"   Fibromyalgia    Finger mass, left 03/2017   middle finger   Gestational hypertension 09/23/2019   Guidelines for Antenatal Testing and Sonography  (with updated ICD-10 codes)  Updated  2019-09-13 with Dr. Noralee Space  INDICATION U/S 2 X week NST/AFI  or full BPP wkly DELIVERY Diabetes   A1 -  good control - O24.410    A2 - good control - O24.419      A2  - poor control or poor compliance - O24.419, E11.65   (Macrosomia or polyhydramnios) **E11.65 is extra code for poor control**    A2/B - O24.   Headache    Hepatitis 07/18/2022   Hepatitis B   History of kidney stones    Hypertension    Irritable bowel syndrome (IBS)    Joint pain    Morbid obesity with body mass index (BMI) of 45.0 to 49.9 in adult [facility])    Plantar fasciitis, bilateral    resolved   Polycystic ovary syndrome    Pre-diabetes    no meds, diet controlled, diet not check blood sugar   Shortness of breath    albuterol inhaler   Sleep apnea    no CPAP use   Vertigo 2017   Past Surgical History:  Procedure Laterality Date  CESAREAN SECTION N/A 10/09/2019   Procedure: CESAREAN SECTION;  Surgeon: Henriette Bing, MD;  [facility]: [facility];  Service: Obstetrics;  Laterality: N/A;   COLONOSCOPY WITH PROPOFOL  10/07/2016   DILATION AND CURETTAGE OF UTERUS     ESOPHAGOGASTRODUODENOSCOPY  12/21/2015   EXCISION MASS UPPER EXTREMETIES Left 04/21/2017   Procedure: EXCISION MASS LEFT MIDDLE FINGER;  Surgeon: Cindee Salt, MD;  Location: [facility];  Service: Orthopedics;  Laterality: Left;   gastric sleeve  05/09/2022   IR FL GUIDED LOC OF NEEDLE/CATH TIP FOR SPINAL INJECTION RT  03/13/2020   KNEE ARTHROSCOPY WITH LATERAL MENISECTOMY Left 10/31/2022   Procedure: KNEE ARTHROSCOPY WITH PARTIAL LATERAL MENISECTOMY;  Surgeon: Yolonda Kida, MD;  Location: [facility];  Service: Orthopedics;  Laterality: Left;  77   KNEE ARTHROSCOPY WITH LATERAL RELEASE Left 10/31/2022   Procedure: KNEE ARTHROSCOPY WITH LATERAL RELEASE AND LIMITED SYNOVECTOMY;  Surgeon: Yolonda Kida, MD;  Location: [facility];  Service: Orthopedics;  Laterality: Left;  77   PHOTOCOAGULATION WITH LASER Left 12/13/2020   Procedure: RETINOPLEXY LEFT EYE;  Surgeon: Stephannie Li, MD;  Location: [facility] OR;  Service: Ophthalmology;  Laterality:  Left;   SCLERAL BUCKLE Right 12/13/2020   Procedure: SCLERAL BUCKLE WITH CRYO;  Surgeon: Stephannie Li, MD;  Location: [facility] OR;  Service: Ophthalmology;  Laterality: Right;   Family History  Problem Relation Age of Onset   Diabetes Maternal Aunt    Hypertension Maternal Uncle    Depression Maternal Grandfather    Diabetes Maternal Grandfather    Hypertension Maternal Grandfather    Arthritis Mother    High blood pressure Mother    Depression Mother    Sleep apnea Mother    Obesity Mother    Diabetes Mother    Hypertension Mother    Kidney failure Mother    Sleep apnea Father    Obesity Father    Healthy Daughter    Social History   Tobacco Use   Smoking status: Never   Smokeless tobacco: Never  Vaping Use   Vaping status: Never Used  Substance Use Topics   Alcohol use: Not Currently    Comment: social    Drug use: No   Allergies  Allergen Reactions   Bee Venom Hives, Shortness Of Breath and Swelling   Hydrocodone-Acetaminophen Anaphylaxis    No problem when she takes Tylenol   Peach Flavor Hives, Shortness Of Breath and Other (See Comments)    SWELLING OF MOUTH   Pollen Extract Hives, Shortness Of Breath and Swelling   Remdesivir Swelling    Angioedema 8/25   Latex Rash   Cortisone Itching and Swelling   Cortizone-10 [Hydrocortisone]     Swelling and itching   Toradol [Ketorolac Tromethamine] Swelling   Current Outpatient Medications on File Prior to Visit  Medication Sig Dispense Refill   albuterol (VENTOLIN HFA) 108 (90 Base) MCG/ACT inhaler Inhale 1 puff into the lungs every 4 (four) hours as needed for shortness of breath.     fluticasone (FLONASE) 50 MCG/ACT nasal spray Place 2 sprays into both nostrils daily. 16 g 6   loratadine (CLARITIN) 10 MG tablet Take 10 mg by mouth daily as needed for allergies.      Prenatal Vit-Fe Phos-FA-Omega (VITAFOL GUMMIES) 3.33-0.333-34.8 MG CHEW Chew 1 tablet by mouth daily. 90 tablet 3   Blood Pressure Monitoring (BLOOD  PRESSURE KIT) DEVI 1 Device by Does not apply route as needed. (Patient not taking: Reported on 05/07/2023) 1 each 0  Misc. Devices (GOJJI WEIGHT SCALE) MISC 1 Device by Does not apply route once a week. (Patient not taking: Reported on 05/07/2023) 1 each 0   oxyCODONE-acetaminophen (PERCOCET/ROXICET) 5-325 MG tablet Take 1 tablet by mouth every 6 (six) hours as needed for severe pain. (Patient not taking: Reported on 05/07/2023)     promethazine (PHENERGAN) 25 MG tablet Take 1 tablet (25 mg total) by mouth every 6 (six) hours as needed for nausea or vomiting. (Patient not taking: Reported on 05/07/2023) 30 tablet 0   No current facility-administered medications on file prior to visit.    Review of Systems Pertinent items noted in HPI and remainder of comprehensive ROS otherwise negative.  Physical Exam:   Vitals:   05/07/23 1051  BP: 132/85  Pulse: 82  Weight: (!) 385 lb (174.6 kg)   Fetal Heart Rate (bpm): 174 Recent US done to confirm viability on 7/08. FHT today 174  General: well-developed, well-nourished female in no acute distress  Breasts:  normal appearance, no masses or tenderness bilaterally  Skin: normal coloration and turgor, no rashes  Neurologic: oriented, normal, negative, normal mood  Extremities: normal strength, tone, and muscle mass, ROM of all joints is normal  HEENT PERRLA, extraocular movement intact and sclera clear, anicteric  Neck supple and no masses  Cardiovascular: regular rate and rhythm  Respiratory:  no respiratory distress, normal breath sounds  Abdomen: soft, non-tender; bowel sounds normal; no masses,  no organomegaly  Pelvic: normal external genitalia, no lesions, normal vaginal mucosa, normal vaginal discharge, normal cervix, pap smear done. Uterine size:  aga    Assessment:    Pregnancy: [id number] Patient Active Problem List   Diagnosis Date Noted   Supervision of high risk pregnancy, antepartum 04/30/2023   History of gestational diabetes  mellitus (GDM) in prior pregnancy, currently pregnant 04/30/2023   Obesity in pregnancy 04/30/2023   History of C-section 04/30/2023   HTN (hypertension) 09/23/2019   Migraine headache 07/12/2019   Alpha thalassemia trait    Vitamin D deficiency 09/16/2017   Prediabetes 09/16/2017   Depression 09/16/2017   Class 3 severe obesity with serious comorbidity and body mass index (BMI) of 50.0 to 59.9 in adult (HCC) 09/16/2017   PCOS (polycystic ovarian syndrome) 08/13/2017   Mass 05/06/2017   Vertigo 10/06/2016   Microcytic anemia 09/26/2016   Obesity hypoventilation syndrome (HCC) 08/28/2016   Benign paroxysmal positional vertigo due to bilateral vestibular disorder 08/28/2016   OSA (obstructive sleep apnea) 08/28/2016   Female hirsutism 08/28/2016   GERD (gastroesophageal reflux disease) 02/24/2015     Plan:    1. Primary hypertension Baseline labs done Low dose asa recommended.  Will send to [facility] for multiple risk factors and for recently noticing increasing heart rate  2. Supervision of high risk pregnancy, antepartum Initial labs drawn. Discussed with her history of c-section, she is not a candidate for waterbirth.  Discussed ACURE4MOMs. Pt accepts. Referral placed.  Continue prenatal vitamins. Problem list reviewed and updated. Genetic Screening discussed, NIPS: requested. Will do at 12 weeks.  Ultrasound discussed; fetal anatomic survey: scheduled. Anticipatory guidance about prenatal visits given including labs, ultrasounds, and testing. Discussed usage of Babyscripts and virtual visits  3. Obesity in pregnancy Due to history of bypass - check ferritin, iron, b12, thiamine, folate, calcium and vitamin D  4. Prediabetes Early 1 hr and A1C today.  If abnormal, would recommend fetal echo.   5. History of C-section Prior c-section for decels in setting of IOL at 37wks. Pt would  like TOLAC!   6. Alpha thalassemia trait Discussed with pt - declines partner testing.    7. Headaches - Start Magnesium  - Discussed may be from dehydration. Checking CMP today. May also be due to history of idiopathic intracranial hypertension and history of migraines.  - Discussed checking B12 to see if supplementation needed - Refer to Clydie Braun with HA clinic at [facility].   8. History of gastric bypass - Checking for nutritional deficiencies today. Done August 2023.    The nature of [place] [facility] with multiple MDs and Advanced Practice Providers was explained to patient; also emphasized that residents, students are part of our team. Routine obstetric precautions reviewed. Encouraged to seek out care at office or emergency room [facility] MAU preferred) for urgent and/or emergent concerns. Return in about 5 weeks (around 06/11/2023) for OB VISIT, MD or APP.    Milas Hock, MD, FACOG Obstetrician & Gynecologist, [facility], [facility]

## 2023-05-07 ENCOUNTER — Ambulatory Visit (INDEPENDENT_AMBULATORY_CARE_PROVIDER_SITE_OTHER): Payer: Medicaid Other

## 2023-05-07 ENCOUNTER — Other Ambulatory Visit: Payer: Self-pay

## 2023-05-07 ENCOUNTER — Encounter: Payer: Self-pay | Admitting: Obstetrics and Gynecology

## 2023-05-07 ENCOUNTER — Ambulatory Visit (INDEPENDENT_AMBULATORY_CARE_PROVIDER_SITE_OTHER): Payer: Medicaid Other | Admitting: Obstetrics and Gynecology

## 2023-05-07 ENCOUNTER — Encounter: Payer: Self-pay | Admitting: Family Medicine

## 2023-05-07 ENCOUNTER — Other Ambulatory Visit (HOSPITAL_COMMUNITY)
Admission: RE | Admit: 2023-05-07 | Discharge: 2023-05-07 | Disposition: A | Discharge status: Home / Self Care | Payer: Medicaid Other | Source: Ambulatory Visit | Attending: Obstetrics and Gynecology | Admitting: Obstetrics and Gynecology

## 2023-05-07 VITALS — BP 132/85 | HR 82 | Wt 385.0 lb

## 2023-05-07 DIAGNOSIS — O0991 Supervision of high risk pregnancy, unspecified, first trimester: Secondary | ICD-10-CM

## 2023-05-07 DIAGNOSIS — O09291 Supervision of pregnancy with other poor reproductive or obstetric history, first trimester: Secondary | ICD-10-CM

## 2023-05-07 DIAGNOSIS — O09299 Supervision of pregnancy with other poor reproductive or obstetric history, unspecified trimester: Secondary | ICD-10-CM

## 2023-05-07 DIAGNOSIS — R7303 Prediabetes: Secondary | ICD-10-CM

## 2023-05-07 DIAGNOSIS — L299 Pruritus, unspecified: Secondary | ICD-10-CM

## 2023-05-07 DIAGNOSIS — O0990 Supervision of high risk pregnancy, unspecified, unspecified trimester: Secondary | ICD-10-CM | POA: Insufficient documentation

## 2023-05-07 DIAGNOSIS — Z8759 Personal history of other complications of pregnancy, childbirth and the puerperium: Secondary | ICD-10-CM | POA: Diagnosis not present

## 2023-05-07 DIAGNOSIS — D563 Thalassemia minor: Secondary | ICD-10-CM

## 2023-05-07 DIAGNOSIS — Z3A01 Less than 8 weeks gestation of pregnancy: Secondary | ICD-10-CM | POA: Diagnosis not present

## 2023-05-07 DIAGNOSIS — Z98891 History of uterine scar from previous surgery: Secondary | ICD-10-CM | POA: Diagnosis not present

## 2023-05-07 DIAGNOSIS — Z9884 Bariatric surgery status: Secondary | ICD-10-CM

## 2023-05-07 DIAGNOSIS — O99210 Obesity complicating pregnancy, unspecified trimester: Secondary | ICD-10-CM

## 2023-05-07 DIAGNOSIS — O99211 Obesity complicating pregnancy, first trimester: Secondary | ICD-10-CM | POA: Diagnosis not present

## 2023-05-07 DIAGNOSIS — I10 Essential (primary) hypertension: Secondary | ICD-10-CM

## 2023-05-07 DIAGNOSIS — Z8632 Personal history of gestational diabetes: Secondary | ICD-10-CM

## 2023-05-07 MED ORDER — TRIAMCINOLONE ACETONIDE 0.025 % EX OINT
1.0000 | TOPICAL_OINTMENT | Freq: Two times a day (BID) | CUTANEOUS | 0 refills | Status: DC | PRN
Start: 2023-05-07 — End: 2023-09-02

## 2023-05-07 MED ORDER — ASPIRIN 81 MG PO TBEC
81.0000 mg | DELAYED_RELEASE_TABLET | Freq: Every day | ORAL | 12 refills | Status: DC
Start: 2023-05-07 — End: 2023-06-08

## 2023-05-07 NOTE — Addendum Note (Signed)
Addended by: Kathee Delton on: 05/07/2023 12:22 PM   Modules accepted: Orders

## 2023-05-07 NOTE — Addendum Note (Signed)
Addended by: Kathee Delton on: 05/07/2023 12:29 PM   Modules accepted: Orders

## 2023-05-07 NOTE — Addendum Note (Signed)
Addended by: Kathee Delton on: 05/07/2023 12:23 PM   Modules accepted: Orders

## 2023-05-07 NOTE — Patient Instructions (Addendum)
Magnesium Glycinate 200-400 mg daily

## 2023-05-07 NOTE — Progress Notes (Signed)
Pt reports headaches with lightheadedness/dizziness every day- extra strength tylenol doesn't help- checks her blood pressure at work when this happens and they are usually 120/70s but has had elevated pulse

## 2023-05-08 LAB — GLUCOSE, 1 HOUR GESTATIONAL: Gestational Diabetes Screen: 70 mg/dL (ref 70–139)

## 2023-05-11 ENCOUNTER — Telehealth: Payer: Self-pay

## 2023-05-11 ENCOUNTER — Encounter: Payer: Self-pay | Admitting: Obstetrics and Gynecology

## 2023-05-11 DIAGNOSIS — O219 Vomiting of pregnancy, unspecified: Secondary | ICD-10-CM

## 2023-05-11 LAB — CYTOLOGY - PAP
Comment: NEGATIVE
Comment: NEGATIVE
High risk HPV: POSITIVE — AB
Neisseria Gonorrhea: NEGATIVE

## 2023-05-11 MED ORDER — ONDANSETRON 4 MG PO TBDP
4.0000 mg | ORAL_TABLET | Freq: Three times a day (TID) | ORAL | 0 refills | Status: DC | PRN
Start: 2023-05-11 — End: 2023-09-02

## 2023-05-11 NOTE — Telephone Encounter (Signed)
Left message for pt to call office back regarding new pt appt w/ Clydie Braun

## 2023-05-12 LAB — CYTOLOGY - PAP: Chlamydia: NEGATIVE

## 2023-05-13 ENCOUNTER — Encounter: Payer: Self-pay | Admitting: Obstetrics and Gynecology

## 2023-05-13 DIAGNOSIS — F439 Reaction to severe stress, unspecified: Secondary | ICD-10-CM | POA: Diagnosis not present

## 2023-05-13 DIAGNOSIS — R87619 Unspecified abnormal cytological findings in specimens from cervix uteri: Secondary | ICD-10-CM | POA: Insufficient documentation

## 2023-05-13 LAB — CYTOLOGY - PAP
Comment: NEGATIVE
Comment: NEGATIVE
Comment: NEGATIVE
Comment: NORMAL
Diagnosis: UNDETERMINED — AB

## 2023-05-14 ENCOUNTER — Encounter: Payer: Self-pay | Admitting: Obstetrics and Gynecology

## 2023-05-14 DIAGNOSIS — N760 Acute vaginitis: Secondary | ICD-10-CM

## 2023-05-15 ENCOUNTER — Institutional Professional Consult (permissible substitution): Payer: Medicaid Other | Admitting: Physician Assistant

## 2023-05-15 ENCOUNTER — Inpatient Hospital Stay (HOSPITAL_COMMUNITY)
Admission: AD | Admit: 2023-05-15 | Discharge: 2023-05-15 | Disposition: A | Discharge status: Home / Self Care | Payer: Medicaid Other | Attending: Obstetrics and Gynecology | Admitting: Obstetrics and Gynecology

## 2023-05-15 DIAGNOSIS — O26891 Other specified pregnancy related conditions, first trimester: Secondary | ICD-10-CM | POA: Diagnosis not present

## 2023-05-15 DIAGNOSIS — Z8632 Personal history of gestational diabetes: Secondary | ICD-10-CM | POA: Insufficient documentation

## 2023-05-15 DIAGNOSIS — R42 Dizziness and giddiness: Secondary | ICD-10-CM | POA: Diagnosis present

## 2023-05-15 DIAGNOSIS — O09299 Supervision of pregnancy with other poor reproductive or obstetric history, unspecified trimester: Secondary | ICD-10-CM

## 2023-05-15 DIAGNOSIS — O218 Other vomiting complicating pregnancy: Secondary | ICD-10-CM | POA: Insufficient documentation

## 2023-05-15 DIAGNOSIS — Z3A08 8 weeks gestation of pregnancy: Secondary | ICD-10-CM

## 2023-05-15 DIAGNOSIS — R519 Headache, unspecified: Secondary | ICD-10-CM | POA: Insufficient documentation

## 2023-05-15 DIAGNOSIS — O219 Vomiting of pregnancy, unspecified: Secondary | ICD-10-CM

## 2023-05-15 LAB — CBC
HCT: 38.7 % (ref 36.0–46.0)
Hemoglobin: 12.3 g/dL (ref 12.0–15.0)
MCH: 25.3 pg — ABNORMAL LOW (ref 26.0–34.0)
MCHC: 31.8 g/dL (ref 30.0–36.0)
MCV: 79.5 fL — ABNORMAL LOW (ref 80.0–100.0)
Platelets: 260 10*3/uL (ref 150–400)
RBC: 4.87 MIL/uL (ref 3.87–5.11)
RDW: 13.3 % (ref 11.5–15.5)
WBC: 7.4 10*3/uL (ref 4.0–10.5)
nRBC: 0 % (ref 0.0–0.2)

## 2023-05-15 LAB — COMPREHENSIVE METABOLIC PANEL
ALT: 11 U/L (ref 0–44)
AST: 15 U/L (ref 15–41)
Albumin: 3.9 g/dL (ref 3.5–5.0)
Alkaline Phosphatase: 70 U/L (ref 38–126)
Anion gap: 9 (ref 5–15)
BUN: 7 mg/dL (ref 6–20)
CO2: 24 mmol/L (ref 22–32)
Calcium: 9.5 mg/dL (ref 8.9–10.3)
Chloride: 102 mmol/L (ref 98–111)
Creatinine, Ser: 0.49 mg/dL (ref 0.44–1.00)
GFR, Estimated: >60 mL/min (ref >60)
Glucose, Bld: 88 mg/dL (ref 70–99)
Potassium: 3.9 mmol/L (ref 3.5–5.1)
Sodium: 135 mmol/L (ref 135–145)
Total Bilirubin: 0.5 mg/dL (ref 0.3–1.2)
Total Protein: 7.7 g/dL (ref 6.5–8.1)

## 2023-05-15 LAB — URINALYSIS, ROUTINE W REFLEX MICROSCOPIC
Bilirubin Urine: NEGATIVE
Glucose, UA: NEGATIVE mg/dL
Hgb urine dipstick: NEGATIVE
Ketones, ur: NEGATIVE mg/dL
Leukocytes,Ua: NEGATIVE
Nitrite: NEGATIVE
Protein, ur: 30 mg/dL — AB
Specific Gravity, Urine: 1.035 — ABNORMAL HIGH (ref 1.005–1.030)
pH: 5 (ref 5.0–8.0)

## 2023-05-15 MED ORDER — FAMOTIDINE 20 MG PO TABS: 20.0000 mg | ORAL_TABLET | Freq: Two times a day (BID) | ORAL | 0 refills | Status: DC

## 2023-05-15 MED ORDER — TRANSDERM-SCOP 1 MG/3DAYS TD PT72: 1.0000 | MEDICATED_PATCH | TRANSDERMAL | 6 refills | Status: DC

## 2023-05-15 MED ORDER — SCOPOLAMINE 1 MG/3DAYS TD PT72
1.0000 | MEDICATED_PATCH | TRANSDERMAL | Status: DC
  Administered 2023-05-15: 1.5 mg via TRANSDERMAL
  Filled 2023-05-15: qty 1

## 2023-05-15 MED ORDER — PROMETHAZINE HCL 25 MG RE SUPP: 25.0000 mg | Freq: Four times a day (QID) | RECTAL | 0 refills | Status: DC | PRN

## 2023-05-15 MED ORDER — DIPHENHYDRAMINE HCL 50 MG/ML IJ SOLN
25.0000 mg | Freq: Once | INTRAMUSCULAR | Status: AC
  Administered 2023-05-15: 25 mg via INTRAVENOUS
  Filled 2023-05-15: qty 1

## 2023-05-15 MED ORDER — METOCLOPRAMIDE HCL 5 MG/ML IJ SOLN
10.0000 mg | Freq: Once | INTRAMUSCULAR | Status: AC
  Administered 2023-05-15: 10 mg via INTRAVENOUS
  Filled 2023-05-15: qty 2

## 2023-05-15 MED ORDER — LACTATED RINGERS IV BOLUS
1000.0000 mL | Freq: Once | INTRAVENOUS | Status: AC
  Administered 2023-05-15: 1000 mL via INTRAVENOUS

## 2023-05-15 MED ORDER — METOCLOPRAMIDE HCL 10 MG PO TABS: 10.0000 mg | ORAL_TABLET | Freq: Four times a day (QID) | ORAL | 0 refills | Status: DC

## 2023-05-15 MED ORDER — CYCLOBENZAPRINE HCL 10 MG PO TABS: 10.0000 mg | ORAL_TABLET | Freq: Two times a day (BID) | ORAL | 0 refills | Status: DC | PRN

## 2023-05-15 NOTE — MAU Note (Signed)
.  Maureen Lewis is a 27 y.o. at [redacted]w[redacted]d here in MAU reporting: has been having headaches most days for several weeks. Has tried tylenol and drinking with caffeen   beverage and was prescribed Fioricet as well and nothing has really helped . It dulls it but does not go away. Has had several times when the headache comes on she gets dizzy and nauseated d and feels like she is going to pass out. . Happened again at work today so her boss told her to get it checked out. Took some tylenol earlier.  Onset of complaint: today Pain score: 10 Vitals:   05/15/23 1735  BP: (!) 124/56  Pulse: 86  Resp: 18  Temp: 98.4 F (36.9 C)     FHT:n/a Lab orders placed from triage:   U/a

## 2023-05-15 NOTE — MAU Provider Note (Signed)
History     CSN: [id number]  Arrival date and time: 05/15/23 1651   None     Chief Complaint  Patient presents with   Headache   HPI Maureen Lewis is a 27 y.o. [id number] at [redacted]w[redacted]d who presents to MAU for headache. She reports headaches "for weeks now" however have started to get worse. She reports she has also felt dizzy and today while at work she got lightheaded and the room started to spin. She rates her headache 10/10. She took Excedrin Tension and Tylenol without relief. She also reports drinking [facility] because of the caffeine without relief. Patient reports nausea and vomiting constantly. She reports she vomits 7-8 times per day. She has prescriptions for Zofran and Phenergan which do not help. She also reports taking Unisom and B12 without relief. She reports she ate loaded fries and fruit for breakfast and a chicken wrap and fruit for lunch. She has also had cheese its as a snack. She has had 3 bottles of water and 1 can of [facility]. Patient denies abdominal pain, vaginal bleeding, discharge, or urinary s/s.  OB History     Gravida  4   Para  1   Term  1   Preterm  0   AB  2   Living  1      SAB  2   IAB  0   Ectopic  0   Multiple  0   Live Births  1           Past Medical History:  Diagnosis Date   Abnormal uterine bleeding    Acid reflux    Alpha thalassemia trait    Amenorrhea    Anemia    no current med.   Asthma    Back pain    Chest pain    resolved   Complication of anesthesia    states had to keep giving her anesthesia during EGD   Constipation    Depression    "I'm good"   Fibromyalgia    Finger mass, left 03/2017   middle finger   Gestational hypertension 09/23/2019   Guidelines for Antenatal Testing and Sonography  (with updated ICD-10 codes)  Updated  26-Sep-2019 with Dr. Noralee Space  INDICATION U/S 2 X week NST/AFI  or full BPP wkly DELIVERY Diabetes   A1 - good control - O24.410    A2 - good control - O24.419      A2   - poor control or poor compliance - O24.419, E11.65   (Macrosomia or polyhydramnios) **E11.65 is extra code for poor control**    A2/B - O24.   Headache    Hepatitis 07/18/2022   Hepatitis B   History of kidney stones    Hypertension    Irritable bowel syndrome (IBS)    Joint pain    Morbid obesity with body mass index (BMI) of 45.0 to 49.9 in adult [facility])    Plantar fasciitis, bilateral    resolved   Polycystic ovary syndrome    Pre-diabetes    no meds, diet controlled, diet not check blood sugar   Shortness of breath    albuterol inhaler   Sleep apnea    no CPAP use   Vertigo 2017    Past Surgical History:  Procedure Laterality Date   CESAREAN SECTION N/A 10/09/2019   Procedure: CESAREAN SECTION;  Surgeon: Susank Bing, MD;  Location: [facility];  Service: Obstetrics;  Laterality: N/A;   COLONOSCOPY WITH  PROPOFOL  10/07/2016   DILATION AND CURETTAGE OF UTERUS     ESOPHAGOGASTRODUODENOSCOPY  12/21/2015   EXCISION MASS UPPER EXTREMETIES Left 04/21/2017   Procedure: EXCISION MASS LEFT MIDDLE FINGER;  Surgeon: Cindee Salt, MD;  Location: [facility];  Service: Orthopedics;  Laterality: Left;   gastric sleeve  05/09/2022   IR FL GUIDED LOC OF NEEDLE/CATH TIP FOR SPINAL INJECTION RT  03/13/2020   KNEE ARTHROSCOPY WITH LATERAL MENISECTOMY Left 10/31/2022   Procedure: KNEE ARTHROSCOPY WITH PARTIAL LATERAL MENISECTOMY;  Surgeon: Yolonda Kida, MD;  Location: [facility];  Service: Orthopedics;  Laterality: Left;  77   KNEE ARTHROSCOPY WITH LATERAL RELEASE Left 10/31/2022   Procedure: KNEE ARTHROSCOPY WITH LATERAL RELEASE AND LIMITED SYNOVECTOMY;  Surgeon: Yolonda Kida, MD;  Location: [facility];  Service: Orthopedics;  Laterality: Left;  77   PHOTOCOAGULATION WITH LASER Left 12/13/2020   Procedure: RETINOPLEXY LEFT EYE;  Surgeon: Stephannie Li, MD;  Location: [facility] OR;  Service: Ophthalmology;  Laterality: Left;   SCLERAL BUCKLE Right 12/13/2020   Procedure:  SCLERAL BUCKLE WITH CRYO;  Surgeon: Stephannie Li, MD;  Location: [facility] OR;  Service: Ophthalmology;  Laterality: Right;    Family History  Problem Relation Age of Onset   Diabetes Maternal Aunt    Hypertension Maternal Uncle    Depression Maternal Grandfather    Diabetes Maternal Grandfather    Hypertension Maternal Grandfather    Arthritis Mother    High blood pressure Mother    Depression Mother    Sleep apnea Mother    Obesity Mother    Diabetes Mother    Hypertension Mother    Kidney failure Mother    Sleep apnea Father    Obesity Father    Healthy Daughter     Social History   Tobacco Use   Smoking status: Never   Smokeless tobacco: Never  Vaping Use   Vaping status: Never Used  Substance Use Topics   Alcohol use: Not Currently    Comment: social    Drug use: No    Allergies:  Allergies  Allergen Reactions   Bee Venom Hives, Shortness Of Breath and Swelling   Hydrocodone-Acetaminophen Anaphylaxis    No problem when she takes Tylenol   Peach Flavor Hives, Shortness Of Breath and Other (See Comments)    SWELLING OF MOUTH   Pollen Extract Hives, Shortness Of Breath and Swelling   Remdesivir Swelling    Angioedema 8/25   Latex Rash   Cortisone Itching and Swelling   Cortizone-10 [Hydrocortisone]     Swelling and itching   Toradol [Ketorolac Tromethamine] Swelling    Medications Prior to Admission  Medication Sig Dispense Refill Last Dose   albuterol (VENTOLIN HFA) 108 (90 Base) MCG/ACT inhaler Inhale 1 puff into the lungs every 4 (four) hours as needed for shortness of breath.      aspirin EC 81 MG tablet Take 1 tablet (81 mg total) by mouth daily. Swallow whole. 30 tablet 12    Blood Pressure Monitoring (BLOOD PRESSURE KIT) DEVI 1 Device by Does not apply route as needed. (Patient not taking: Reported on 05/07/2023) 1 each 0    fluticasone (FLONASE) 50 MCG/ACT nasal spray Place 2 sprays into both nostrils daily. 16 g 6    loratadine (CLARITIN) 10 MG tablet  Take 10 mg by mouth daily as needed for allergies.       ondansetron (ZOFRAN-ODT) 4 MG disintegrating tablet Take 1 tablet (4 mg total) by mouth every 8 (  eight) hours as needed for nausea or vomiting. 30 tablet 0    Prenatal Vit-Fe Phos-FA-Omega (VITAFOL GUMMIES) 3.33-0.333-34.8 MG CHEW Chew 1 tablet by mouth daily. 90 tablet 3    promethazine (PHENERGAN) 25 MG tablet Take 1 tablet (25 mg total) by mouth every 6 (six) hours as needed for nausea or vomiting. (Patient not taking: Reported on 05/07/2023) 30 tablet 0    triamcinolone (KENALOG) 0.025 % ointment Apply 1 Application topically 2 (two) times daily as needed (itching). 30 g 0    Review of Systems  Gastrointestinal:  Positive for nausea and vomiting.  Neurological:  Positive for headaches.   Physical Exam   Blood pressure 116/75, pulse 86, temperature 98.4 F (36.9 C), resp. rate 18, height 6\' 2"  (1.88 m), weight (!) 174.6 kg, last menstrual period 03/07/2023.  Physical Exam Vitals reviewed.  Constitutional:      General: She is not in acute distress.    Appearance: She is obese.  Eyes:     Extraocular Movements: Extraocular movements intact.     Pupils: Pupils are equal, round, and reactive to light.  Cardiovascular:     Rate and Rhythm: Normal rate.  Pulmonary:     Effort: Pulmonary effort is normal. No respiratory distress.  Abdominal:     Palpations: Abdomen is soft.     Tenderness: There is no abdominal tenderness.  Musculoskeletal:        General: Normal range of motion.     Cervical back: Normal range of motion.  Skin:    General: Skin is warm.  Neurological:     Mental Status: She is alert and oriented to person, place, and time.     GCS: GCS eye subscore is 4. GCS verbal subscore is 5. GCS motor subscore is 6.     Cranial Nerves: No cranial nerve deficit or facial asymmetry.  Psychiatric:        Mood and Affect: Mood normal.        Behavior: Behavior normal.    Results for orders placed or performed during  the hospital encounter of 05/15/23 (from the past 24 hour(s))  Urinalysis, Routine w reflex microscopic -Urine, Clean Catch     Status: Abnormal   Collection Time: 05/15/23  5:51 PM  Result Value Ref Range   Color, Urine AMBER (A) YELLOW   APPearance CLEAR CLEAR   Specific Gravity, Urine 1.035 (H) 1.005 - 1.030   pH 5.0 5.0 - 8.0   Glucose, UA NEGATIVE NEGATIVE mg/dL   Hgb urine dipstick NEGATIVE NEGATIVE   Bilirubin Urine NEGATIVE NEGATIVE   Ketones, ur NEGATIVE NEGATIVE mg/dL   Protein, ur 30 (A) NEGATIVE mg/dL   Nitrite NEGATIVE NEGATIVE   Leukocytes,Ua NEGATIVE NEGATIVE   RBC / HPF 0-5 0 - 5 RBC/hpf   WBC, UA 0-5 0 - 5 WBC/hpf   Bacteria, UA RARE (A) NONE SEEN   Squamous Epithelial / HPF 0-5 0 - 5 /HPF   Mucus PRESENT   CBC     Status: Abnormal   Collection Time: 05/15/23  7:08 PM  Result Value Ref Range   WBC 7.4 4.0 - 10.5 K/uL   RBC 4.87 3.87 - 5.11 MIL/uL   Hemoglobin 12.3 12.0 - 15.0 g/dL   HCT 95.1 88.4 - 16.6 %   MCV 79.5 (L) 80.0 - 100.0 fL   MCH 25.3 (L) 26.0 - 34.0 pg   MCHC 31.8 30.0 - 36.0 g/dL   RDW 06.3 01.6 - 01.0 %   Platelets 260  150 - 400 K/uL   nRBC 0.0 0.0 - 0.2 %  Comprehensive metabolic panel     Status: None   Collection Time: 05/15/23  7:08 PM  Result Value Ref Range   Sodium 135 135 - 145 mmol/L   Potassium 3.9 3.5 - 5.1 mmol/L   Chloride 102 98 - 111 mmol/L   CO2 24 22 - 32 mmol/L   Glucose, Bld 88 70 - 99 mg/dL   BUN 7 6 - 20 mg/dL   Creatinine, Ser 9.52 0.44 - 1.00 mg/dL   Calcium 9.5 8.9 - 84.1 mg/dL   Total Protein 7.7 6.5 - 8.1 g/dL   Albumin 3.9 3.5 - 5.0 g/dL   AST 15 15 - 41 U/L   ALT 11 0 - 44 U/L   Alkaline Phosphatase 70 38 - 126 U/L   Total Bilirubin 0.5 0.3 - 1.2 mg/dL   GFR, Estimated >32 >44 mL/min   Anion gap 9 5 - 15    MAU Course  Procedures  MDM UA CBC, CMP LR bolus, Reglan, Benadryl Scopolamine patch  UA negative. CBC and CMP unremarkable. Patient reports significant improvement in nausea and headache  after medications and fluids.   Assessment and Plan   1. History of gestational diabetes mellitus (GDM) in prior pregnancy, currently pregnant   2. [redacted] weeks gestation of pregnancy   3. Nausea and vomiting during pregnancy prior to [redacted] weeks gestation   4. Pregnancy headache in first trimester    - Discharge home in stable condition - Rx for Flexeril, Pepcid, Reglan, and Scopolamine sent - Return precautions given. Return to MAU as needed - Keep OB appointment as scheduled   Brand Males, CNM 05/15/2023, 8:31 PM

## 2023-05-18 MED ORDER — METRONIDAZOLE 0.75 % EX GEL
1.0000 | Freq: Every day | CUTANEOUS | 0 refills | Status: AC
Start: 2023-05-18 — End: 2023-05-23

## 2023-05-20 ENCOUNTER — Encounter: Payer: Self-pay | Admitting: *Deleted

## 2023-05-20 ENCOUNTER — Other Ambulatory Visit (HOSPITAL_COMMUNITY): Payer: Self-pay

## 2023-06-01 ENCOUNTER — Inpatient Hospital Stay (HOSPITAL_COMMUNITY)
Admission: AD | Admit: 2023-06-01 | Discharge: 2023-06-01 | Disposition: A | Discharge status: Home / Self Care | Payer: Medicaid Other | Attending: Obstetrics and Gynecology | Admitting: Obstetrics and Gynecology

## 2023-06-01 ENCOUNTER — Encounter (HOSPITAL_COMMUNITY): Payer: Self-pay | Admitting: Obstetrics and Gynecology

## 2023-06-01 DIAGNOSIS — O99210 Obesity complicating pregnancy, unspecified trimester: Secondary | ICD-10-CM

## 2023-06-01 DIAGNOSIS — Z98891 History of uterine scar from previous surgery: Secondary | ICD-10-CM

## 2023-06-01 DIAGNOSIS — O99351 Diseases of the nervous system complicating pregnancy, first trimester: Secondary | ICD-10-CM | POA: Insufficient documentation

## 2023-06-01 DIAGNOSIS — O0990 Supervision of high risk pregnancy, unspecified, unspecified trimester: Secondary | ICD-10-CM

## 2023-06-01 DIAGNOSIS — O09299 Supervision of pregnancy with other poor reproductive or obstetric history, unspecified trimester: Secondary | ICD-10-CM

## 2023-06-01 DIAGNOSIS — O26891 Other specified pregnancy related conditions, first trimester: Secondary | ICD-10-CM | POA: Diagnosis present

## 2023-06-01 DIAGNOSIS — G44209 Tension-type headache, unspecified, not intractable: Secondary | ICD-10-CM

## 2023-06-01 DIAGNOSIS — Z3A11 11 weeks gestation of pregnancy: Secondary | ICD-10-CM | POA: Diagnosis not present

## 2023-06-01 DIAGNOSIS — R519 Headache, unspecified: Secondary | ICD-10-CM | POA: Diagnosis present

## 2023-06-01 LAB — URINALYSIS, ROUTINE W REFLEX MICROSCOPIC
Bilirubin Urine: NEGATIVE
Glucose, UA: NEGATIVE mg/dL
Hgb urine dipstick: NEGATIVE
Ketones, ur: NEGATIVE mg/dL
Leukocytes,Ua: NEGATIVE
Nitrite: NEGATIVE
Protein, ur: NEGATIVE mg/dL
Specific Gravity, Urine: 1.027 (ref 1.005–1.030)
pH: 6 (ref 5.0–8.0)

## 2023-06-01 LAB — CBC
HCT: 36.6 % (ref 36.0–46.0)
Hemoglobin: 11.7 g/dL — ABNORMAL LOW (ref 12.0–15.0)
MCH: 24.9 pg — ABNORMAL LOW (ref 26.0–34.0)
MCHC: 32 g/dL (ref 30.0–36.0)
MCV: 77.9 fL — ABNORMAL LOW (ref 80.0–100.0)
Platelets: 195 10*3/uL (ref 150–400)
RBC: 4.7 MIL/uL (ref 3.87–5.11)
RDW: 13.4 % (ref 11.5–15.5)
WBC: 5.3 10*3/uL (ref 4.0–10.5)
nRBC: 0 % (ref 0.0–0.2)

## 2023-06-01 LAB — COMPREHENSIVE METABOLIC PANEL
ALT: 11 U/L (ref 0–44)
AST: 17 U/L (ref 15–41)
Albumin: 3.4 g/dL — ABNORMAL LOW (ref 3.5–5.0)
Alkaline Phosphatase: 53 U/L (ref 38–126)
Anion gap: 10 (ref 5–15)
BUN: 5 mg/dL — ABNORMAL LOW (ref 6–20)
CO2: 22 mmol/L (ref 22–32)
Calcium: 9 mg/dL (ref 8.9–10.3)
Chloride: 103 mmol/L (ref 98–111)
Creatinine, Ser: 0.45 mg/dL (ref 0.44–1.00)
GFR, Estimated: >60 mL/min (ref >60)
Glucose, Bld: 96 mg/dL (ref 70–99)
Potassium: 4.3 mmol/L (ref 3.5–5.1)
Sodium: 135 mmol/L (ref 135–145)
Total Bilirubin: 0.6 mg/dL (ref 0.3–1.2)
Total Protein: 6.9 g/dL (ref 6.5–8.1)

## 2023-06-01 LAB — MAGNESIUM: Magnesium: 1.8 mg/dL (ref 1.7–2.4)

## 2023-06-01 LAB — WET PREP, GENITAL
Clue Cells Wet Prep HPF POC: NONE SEEN
Sperm: NONE SEEN
Trich, Wet Prep: NONE SEEN
WBC, Wet Prep HPF POC: <10 (ref <10)
Yeast Wet Prep HPF POC: NONE SEEN

## 2023-06-01 MED ORDER — LIDOCAINE HCL (PF) 1 % IJ SOLN
10.0000 mL | Freq: Once | INTRAMUSCULAR | Status: AC
  Administered 2023-06-01: 10 mL via INTRADERMAL
  Filled 2023-06-01: qty 10

## 2023-06-01 MED ORDER — METOCLOPRAMIDE HCL 5 MG/ML IJ SOLN
10.0000 mg | Freq: Once | INTRAMUSCULAR | Status: AC
  Administered 2023-06-01: 10 mg via INTRAVENOUS
  Filled 2023-06-01: qty 2

## 2023-06-01 MED ORDER — DIPHENHYDRAMINE HCL 50 MG/ML IJ SOLN
25.0000 mg | Freq: Once | INTRAMUSCULAR | Status: AC
  Administered 2023-06-01: 25 mg via INTRAVENOUS
  Filled 2023-06-01: qty 1

## 2023-06-01 MED ORDER — LACTATED RINGERS IV BOLUS
1000.0000 mL | Freq: Once | INTRAVENOUS | Status: AC
  Administered 2023-06-01: 1000 mL via INTRAVENOUS

## 2023-06-01 NOTE — MAU Note (Signed)
.  Maureen Lewis is a 27 y.o. at [redacted]w[redacted]d here in MAU reporting: has had HA off and on for a few weeks. Marland Kitchen Headache are getting worse. Taking flexeril and Reglan, phenergan and Pepcid. Medicine is not helping any more. Pt stated she has periods of feeling dizzy like she is going to pass out and having some n/v and abd pain with it as well. Reports a white cottage cheese like vag discharge but not itchy or uncomfortable.  LMP:  Onset of complaint: over a week.  Pain score: 8-9 Vitals:   06/01/23 1721  BP: 133/69  Pulse: 100  Resp: 18  Temp: 98.3 F (36.8 C)     XLK:GMWNUU to obtain due to pt position and abd girth Lab orders placed from triage:  u/a

## 2023-06-01 NOTE — MAU Provider Note (Cosign Needed Addendum)
History     CSN: [id number]  Arrival date and time: 06/01/23 1642   Event Date/Time   First Provider Initiated Contact with Patient 06/01/23 1901      Chief Complaint  Patient presents with   Headache   Abdominal Pain   HPI Patient is a 27 y.o. [id number] at [redacted]w[redacted]d who presents with headaches. Reports headaches have been ongoing for the past month or so, feel pretty constant. No change in severity throughout the day, but do not wake her up from sleep. She reports headaches start on the left side of her shoulder and go up to her occipital area then to frontal head. She reports occasionally having "spots" in her vision, but feel that they are due to pain. She endorses nausea due to severity of her pain. 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 No trauma/injury, no vision changes, no new eye glasses, changes in caffeine consumption (though has tried to drink [facility] for the caffeine, but this didn't change headache). No fevers, chills, recent sick contacts, abdominal pain, changes in bowel habits, urinary symptoms. No VB, LOF, pelvic pain. She has been taking Reglan, Flexeril, Phenergan, and prenatals w/o much relief of sxs.   Past Medical History:  Diagnosis Date   Abnormal uterine bleeding    Acid reflux    Alpha thalassemia trait    Amenorrhea    Anemia    no current med.   Asthma    Back pain    Chest pain    resolved   Complication of anesthesia    states had to keep giving her anesthesia during EGD   Constipation    Depression    "I'm good"   Fibromyalgia    Finger mass, left 03/2017   middle finger   Gestational hypertension 09/23/2019   Guidelines for Antenatal Testing and Sonography  (with updated ICD-10 codes)  Updated  2019-09-22 with Dr. Noralee Space  INDICATION U/S 2 X week NST/AFI  or full BPP wkly DELIVERY Diabetes   A1 - good control - O24.410    A2 - good control - O24.419      A2  - poor control or poor compliance -  O24.419, E11.65   (Macrosomia or polyhydramnios) **E11.65 is extra code for poor control**    A2/B - O24.   Headache    Hepatitis 07/18/2022   Hepatitis B   History of kidney stones    Hypertension    Irritable bowel syndrome (IBS)    Joint pain    Morbid obesity with body mass index (BMI) of 45.0 to 49.9 in adult [facility])    Plantar fasciitis, bilateral    resolved   Polycystic ovary syndrome    Pre-diabetes    no meds, diet controlled, diet not check blood sugar   Shortness of breath    albuterol inhaler   Sleep apnea    no CPAP use   Vertigo 2017    Past Surgical History:  Procedure Laterality Date   CESAREAN SECTION N/A 10/09/2019   Procedure: CESAREAN SECTION;  Surgeon: [place] Bing, MD;  Location: [facility];  Service: Obstetrics;  Laterality: N/A;   COLONOSCOPY WITH PROPOFOL  10/07/2016   DILATION AND CURETTAGE OF UTERUS     ESOPHAGOGASTRODUODENOSCOPY  12/21/2015   EXCISION MASS UPPER EXTREMETIES Left 04/21/2017   Procedure: EXCISION MASS LEFT MIDDLE FINGER;  Surgeon: Cindee Salt, MD;  Location: [facility];  Service: Orthopedics;  Laterality:  Left;   gastric sleeve  05/09/2022   IR FL GUIDED LOC OF NEEDLE/CATH TIP FOR SPINAL INJECTION RT  03/13/2020   KNEE ARTHROSCOPY WITH LATERAL MENISECTOMY Left 10/31/2022   Procedure: KNEE ARTHROSCOPY WITH PARTIAL LATERAL MENISECTOMY;  Surgeon: Yolonda Kida, MD;  Location: [facility] ORS;  Service: Orthopedics;  Laterality: Left;  77   KNEE ARTHROSCOPY WITH LATERAL RELEASE Left 10/31/2022   Procedure: KNEE ARTHROSCOPY WITH LATERAL RELEASE AND LIMITED SYNOVECTOMY;  Surgeon: Yolonda Kida, MD;  Location: [facility]S;  Service: Orthopedics;  Laterality: Left;  77   PHOTOCOAGULATION WITH LASER Left 12/13/2020   Procedure: RETINOPLEXY LEFT EYE;  Surgeon: Stephannie Li, MD;  Location: [facility] OR;  Service: Ophthalmology;  Laterality: Left;   SCLERAL BUCKLE Right 12/13/2020   Procedure: SCLERAL BUCKLE WITH CRYO;  Surgeon:  Stephannie Li, MD;  Location: [facility];  Service: Ophthalmology;  Laterality: Right;    Family History  Problem Relation Age of Onset   Diabetes Maternal Aunt    Hypertension Maternal Uncle    Depression Maternal Grandfather    Diabetes Maternal Grandfather    Hypertension Maternal Grandfather    Arthritis Mother    High blood pressure Mother    Depression Mother    Sleep apnea Mother    Obesity Mother    Diabetes Mother    Hypertension Mother    Kidney failure Mother    Sleep apnea Father    Obesity Father    Healthy Daughter     Social History   Tobacco Use   Smoking status: Never   Smokeless tobacco: Never  Vaping Use   Vaping status: Never Used  Substance Use Topics   Alcohol use: Not Currently    Comment: social    Drug use: No    Allergies:  Allergies  Allergen Reactions   Bee Venom Hives, Shortness Of Breath and Swelling   Hydrocodone-Acetaminophen Anaphylaxis    No problem when she takes Tylenol   Peach Flavor Hives, Shortness Of Breath and Other (See Comments)    SWELLING OF MOUTH   Pollen Extract Hives, Shortness Of Breath and Swelling   Remdesivir Swelling    Angioedema 8/25   Latex Rash   Cortisone Itching and Swelling   Cortizone-10 [Hydrocortisone]     Swelling and itching   Toradol [Ketorolac Tromethamine] Swelling    Medications Prior to Admission  Medication Sig Dispense Refill Last Dose   aspirin EC 81 MG tablet Take 1 tablet (81 mg total) by mouth daily. Swallow whole. 30 tablet 12 05/31/2023   cyclobenzaprine (FLEXERIL) 10 MG tablet Take 1 tablet (10 mg total) by mouth 2 (two) times daily as needed for muscle spasms. 20 tablet 0 05/31/2023   metoCLOPramide (REGLAN) 10 MG tablet Take 1 tablet (10 mg total) by mouth every 6 (six) hours. 30 tablet 0 05/31/2023   promethazine (PHENERGAN) 25 MG suppository Place 1 suppository (25 mg total) rectally every 6 (six) hours as needed for nausea or vomiting. 12 each 0 05/31/2023   scopolamine  (TRANSDERM-SCOP) 1 MG/3DAYS Place 1 patch (1.5 mg total) onto the skin every 3 (three) days. 10 patch 6 05/31/2023   albuterol (VENTOLIN HFA) 108 (90 Base) MCG/ACT inhaler Inhale 1 puff into the lungs every 4 (four) hours as needed for shortness of breath.      Blood Pressure Monitoring (BLOOD PRESSURE KIT) DEVI 1 Device by Does not apply route as needed. (Patient not taking: Reported on 05/07/2023) 1 each 0    famotidine (PEPCID) 20  MG tablet Take 1 tablet (20 mg total) by mouth 2 (two) times daily. 30 tablet 0    fluticasone (FLONASE) 50 MCG/ACT nasal spray Place 2 sprays into both nostrils daily. 16 g 6    loratadine (CLARITIN) 10 MG tablet Take 10 mg by mouth daily as needed for allergies.       ondansetron (ZOFRAN-ODT) 4 MG disintegrating tablet Take 1 tablet (4 mg total) by mouth every 8 (eight) hours as needed for nausea or vomiting. (Patient not taking: Reported on 06/01/2023) 30 tablet 0 Not Taking   Prenatal Vit-Fe Phos-FA-Omega (VITAFOL GUMMIES) 3.33-0.333-34.8 MG CHEW Chew 1 tablet by mouth daily. 90 tablet 3    promethazine (PHENERGAN) 25 MG tablet Take 1 tablet (25 mg total) by mouth every 6 (six) hours as needed for nausea or vomiting. (Patient not taking: Reported on 05/07/2023) 30 tablet 0    triamcinolone (KENALOG) 0.025 % ointment Apply 1 Application topically 2 (two) times daily as needed (itching). 30 g 0     Review of Systems  Constitutional:  Negative for chills and fever.  HENT:  Negative for congestion.   Respiratory:  Negative for cough and shortness of breath.   Cardiovascular:  Negative for chest pain, palpitations and leg swelling.  Gastrointestinal:  Positive for nausea and vomiting. Negative for abdominal pain, constipation and diarrhea.  Musculoskeletal:  Positive for neck pain. Negative for back pain and neck stiffness.  Skin:  Negative for rash.  Neurological:  Positive for headaches. Negative for dizziness, weakness, light-headedness and numbness.   Physical Exam    Blood pressure 131/74, pulse 93, temperature 98.3 F (36.8 C), resp. rate 18, height 6\' 2"  (1.88 m), weight (!) 174.6 kg, last menstrual period 03/07/2023.  Physical Exam Constitutional:      General: She is not in acute distress.    Appearance: She is well-developed.  HENT:     Head: Normocephalic and atraumatic.     Mouth/Throat:     Mouth: Mucous membranes are moist.     Pharynx: Oropharynx is clear.  Eyes:     Extraocular Movements:     Right eye: Normal extraocular motion.     Left eye: Normal extraocular motion.  Neck:     Comments: +pinpoint tenderness at several spots of left trapezius  Cardiovascular:     Rate and Rhythm: Normal rate.  Pulmonary:     Effort: Pulmonary effort is normal. No respiratory distress.  Abdominal:     Palpations: Abdomen is soft.     Tenderness: There is no abdominal tenderness.  Musculoskeletal:        General: Normal range of motion.     Cervical back: Normal range of motion.  Skin:    General: Skin is warm and dry.     Findings: No rash.  Neurological:     Mental Status: She is alert and oriented to person, place, and time. Mental status is at baseline.     Cranial Nerves: No cranial nerve deficit, dysarthria or facial asymmetry.     Sensory: No sensory deficit.     Motor: No weakness.     Coordination: Coordination normal.  Psychiatric:        Mood and Affect: Mood normal.        Behavior: Behavior normal.     MAU Course   Procedure note:  Patient name: Annick Pingitore Corso  Date of procedure: 06/01/23 Procedure performed by: Sundra Aland Provider assisted by: Anastasio Champion, RN Patient assisted by: Self  Procedure:  Risks, benefits, and alternatives as well as the procedure were discussed with the patient in detail. Informed consent was obtained verbally and in writing. Four sites were identified and marked of the left trapezius muscle. The area was cleaned and prepped with sterile technique using Chloraprep x 3.  The four identified areas were injected with 2.5 mL of 1% Lidocaine. The patient tolerated the procedure well and there were no complications. The patient reported improvement of symptoms of muscle spasm.  Sundra Aland, MD OB Fellow, Faculty Practice [facility], Center for [facility]   MDM 7:06 PM Patient is a 27 y.o. [phone] at [redacted]w[redacted]d who presents with one month history of headaches, refractive to current management (Reglan, Phenergan, Flexeril). Vital signs stable, exam non-focal, no red flag symptoms. On exam, patient with pinpoint tenderness to palpation of several spots of left trapezius. Given exam, suspect most likely diagnosis is tension headache, esp in pt who is frequently using computer with poor posture. IV fluids, benadryl, Reglan, which helped last visit, were attempted but did not resolve symptoms. Discussed option to perform trigger point injections for patient today, which she consents to.   7:33 PM  Trigger point injection w relief of muscle spasms reported. Will continue IV fluids and reassess in 15-20 min.   7:53 PM  Patient reassessed and reports improvement of symptoms. Recommend heat, stretching, Flexeril prn. Return precautions discussed.  Assessment and Plan   Tension headache S/p trigger point injections w relief of sxs  d/w pt importance of heat/stretching. PT referral sent  stable for d/c.  Patient discussed with Raelyn Mora, CNM.  Sundra Aland, MD OB Fellow, Faculty Practice [facility], Center for [facility]

## 2023-06-03 DIAGNOSIS — F439 Reaction to severe stress, unspecified: Secondary | ICD-10-CM | POA: Diagnosis not present

## 2023-06-04 ENCOUNTER — Telehealth: Payer: Self-pay

## 2023-06-04 NOTE — Telephone Encounter (Signed)
 Called pt to confirm upcoming appointment after missed automatic messages.  Left message for pt to call and confirm appointment to avoid cancellation of appt.

## 2023-06-05 ENCOUNTER — Encounter: Payer: Self-pay | Admitting: Physician Assistant

## 2023-06-05 ENCOUNTER — Ambulatory Visit: Payer: Medicaid Other | Admitting: Physician Assistant

## 2023-06-05 ENCOUNTER — Encounter: Payer: Self-pay | Admitting: *Deleted

## 2023-06-05 VITALS — BP 139/83 | HR 96 | Wt 384.6 lb

## 2023-06-05 DIAGNOSIS — O26891 Other specified pregnancy related conditions, first trimester: Secondary | ICD-10-CM | POA: Diagnosis not present

## 2023-06-05 DIAGNOSIS — G43101 Migraine with aura, not intractable, with status migrainosus: Secondary | ICD-10-CM

## 2023-06-05 DIAGNOSIS — R519 Headache, unspecified: Secondary | ICD-10-CM | POA: Diagnosis not present

## 2023-06-05 DIAGNOSIS — G43001 Migraine without aura, not intractable, with status migrainosus: Secondary | ICD-10-CM

## 2023-06-05 DIAGNOSIS — O26899 Other specified pregnancy related conditions, unspecified trimester: Secondary | ICD-10-CM | POA: Insufficient documentation

## 2023-06-05 MED ORDER — BUTALBITAL-APAP-CAFFEINE 50-325-40 MG PO TABS: 1.0000 | ORAL_TABLET | Freq: Four times a day (QID) | ORAL | 1 refills | Status: DC | PRN

## 2023-06-05 MED ORDER — CYCLOBENZAPRINE HCL 10 MG PO TABS: 10.0000 mg | ORAL_TABLET | Freq: Three times a day (TID) | ORAL | 2 refills | Status: DC | PRN

## 2023-06-05 NOTE — Progress Notes (Signed)
CC: New Headache  X 1 months Does notice dots or stars  Does feel faint/lightheaded  No headaches prior to pregnancy  Nausea and Vomiting with headaches  Occipital and frontal  Varies by the day and pain also varies  Hx of vertigo  Dull aching pain, weighing pain, pressure   History:  Maureen Lewis is a 27 y.o. [id number] who presents to clinic today for new headache eval.  She has been to the ED for headaches.  She has not had a history of headaches until this pregnancy.  Location can be frontal or can begin at the neck/traps, always bilat.  There is throbbing.  Movement makes it worse, sensitive to bright light and loud noises.  She tries to relax with quiet and dim light.  It lasts all day to several days.  She has nausea and vomiting related to the pregnancy but notes it is worsened with most severe headaches.  .  The pain is severe right from the start and maintains that strong level throughout the episode.  The headaches are daily for the last couple months. She sees bright starts bilaterally first, then develops dizziness and then the headache begins.  She may then feel lightheaded and may pass out.   Earlier this week she was seen in ED and given TPI which helped a short time.  In chart, it has that pt is allergic to hydrocodone with acetaminophen but pt reports no issue with acetaminophen.  Also, pt did have a problem with cortisone injection in ankle but later had one in knee with no issue.   She reports no allergy to this.   HIT6:74 Number of days in the last 4 weeks with:  Severe headache: 18 Moderate headache: 10 Mild headache: 0  No headache: 0   Past Medical History:  Diagnosis Date   Abnormal uterine bleeding    Acid reflux    Alpha thalassemia trait    Amenorrhea    Anemia    no current med.   Asthma    Back pain    Chest pain    resolved   Complication of anesthesia    states had to keep giving her anesthesia during EGD   Constipation    Depression     "I'm good"   Fibromyalgia    Finger mass, left 03/2017   middle finger   Gestational hypertension 09/23/2019   Guidelines for Antenatal Testing and Sonography  (with updated ICD-10 codes)  Updated  October 06, 2019 with Dr. Noralee Space  INDICATION U/S 2 X week NST/AFI  or full BPP wkly DELIVERY Diabetes   A1 - good control - O24.410    A2 - good control - O24.419      A2  - poor control or poor compliance - O24.419, E11.65   (Macrosomia or polyhydramnios) **E11.65 is extra code for poor control**    A2/B - O24.   Headache    Hepatitis 07/18/2022   Hepatitis B   History of kidney stones    Hypertension    Irritable bowel syndrome (IBS)    Joint pain    Morbid obesity with body mass index (BMI) of 45.0 to 49.9 in adult [facility])    Plantar fasciitis, bilateral    resolved   Polycystic ovary syndrome    Pre-diabetes    no meds, diet controlled, diet not check blood sugar   Shortness of breath    albuterol inhaler   Sleep apnea    no CPAP use  Vertigo 2017    Social History   Socioeconomic History   Marital status: Single    Spouse name: Not on file   Number of children: Not on file   Years of education: Not on file   Highest education level: Not on file  Occupational History   Occupation: IT sales professional    Employer: [id number]  Tobacco Use   Smoking status: Never   Smokeless tobacco: Never  Vaping Use   Vaping status: Never Used  Substance and Sexual Activity   Alcohol use: Not Currently    Comment: social    Drug use: No   Sexual activity: Not Currently    Birth control/protection: None  Other Topics Concern   Not on file  Social History Narrative   Right handed    Soda sometimes   Lives with mom and cousin, a grandmother in [place] also helps care for her.       She is in nursing school.    Social Determinants of Health   Financial Resource Strain: High Risk (11/05/2022)   Received from [facility], [facility]   Overall Financial Resource Strain (CARDIA)     Difficulty of Paying Living Expenses: Hard  Food Insecurity: No Food Insecurity (05/07/2023)   Hunger Vital Sign    Worried About Running Out of Food in the Last Year: Never true    Ran Out of Food in the Last Year: Never true  Transportation Needs: No Transportation Needs (05/07/2023)   PRAPARE - Administrator, Civil Service (Medical): No    Lack of Transportation (Non-Medical): No  Physical Activity: Inactive (11/05/2022)   [facility] from [facility], [facility]   Exercise Vital Sign    Days of Exercise per Week: 0 days    Minutes of Exercise per Session: 40 min  Stress: Stress Concern Present (11/05/2022)   Received from [facility], [facility] of Occupational Health - Occupational Stress Questionnaire    Feeling of Stress : Rather much  Social Connections: Somewhat Isolated (11/05/2022)   Received from [facility], [facility]   Social Network    How would you rate your social network (family, work, friends)?: Restricted participation with some degree of social isolation  Intimate Partner Violence: Not At Risk (11/05/2022)   Received from [facility], [facility]   HITS    Over the last 12 months how often did your partner physically hurt you?: 1    Over the last 12 months how often did your partner insult you or talk down to you?: 1    Over the last 12 months how often did your partner threaten you with physical harm?: 1    Over the last 12 months how often did your partner scream or curse at you?: 1    Family History  Problem Relation Age of Onset   Diabetes Maternal Aunt    Hypertension Maternal Uncle    Depression Maternal Grandfather    Diabetes Maternal Grandfather    Hypertension Maternal Grandfather    Arthritis Mother    High blood pressure Mother    Depression Mother    Sleep apnea Mother    Obesity Mother    Diabetes Mother    Hypertension Mother    Kidney failure Mother    Sleep apnea Father    Obesity Father     Healthy Daughter     Allergies  Allergen Reactions   Bee Venom Hives, Shortness Of Breath and Swelling  Hydrocodone-Acetaminophen Anaphylaxis    No problem when she takes Tylenol   Estate agent, Shortness Of Breath and Other (See Comments)    SWELLING OF MOUTH   Pollen Extract Hives, Shortness Of Breath and Swelling   Remdesivir Swelling    Angioedema 8/25   Latex Rash   Cortisone Itching and Swelling   Cortizone-10 [Hydrocortisone]     Swelling and itching   Toradol [Ketorolac Tromethamine] Swelling    Current Outpatient Medications on File Prior to Visit  Medication Sig Dispense Refill   albuterol (VENTOLIN HFA) 108 (90 Base) MCG/ACT inhaler Inhale 1 puff into the lungs every 4 (four) hours as needed for shortness of breath.     aspirin EC 81 MG tablet Take 1 tablet (81 mg total) by mouth daily. Swallow whole. 30 tablet 12   cyclobenzaprine (FLEXERIL) 10 MG tablet Take 1 tablet (10 mg total) by mouth 2 (two) times daily as needed for muscle spasms. 20 tablet 0   famotidine (PEPCID) 20 MG tablet Take 1 tablet (20 mg total) by mouth 2 (two) times daily. 30 tablet 0   fluticasone (FLONASE) 50 MCG/ACT nasal spray Place 2 sprays into both nostrils daily. 16 g 6   loratadine (CLARITIN) 10 MG tablet Take 10 mg by mouth daily as needed for allergies.      metoCLOPramide (REGLAN) 10 MG tablet Take 1 tablet (10 mg total) by mouth every 6 (six) hours. 30 tablet 0   Prenatal Vit-Fe Phos-FA-Omega (VITAFOL GUMMIES) 3.33-0.333-34.8 MG CHEW Chew 1 tablet by mouth daily. 90 tablet 3   promethazine (PHENERGAN) 25 MG suppository Place 1 suppository (25 mg total) rectally every 6 (six) hours as needed for nausea or vomiting. 12 each 0   scopolamine (TRANSDERM-SCOP) 1 MG/3DAYS Place 1 patch (1.5 mg total) onto the skin every 3 (three) days. 10 patch 6   triamcinolone (KENALOG) 0.025 % ointment Apply 1 Application topically 2 (two) times daily as needed (itching). 30 g 0   Blood Pressure  Monitoring (BLOOD PRESSURE KIT) DEVI 1 Device by Does not apply route as needed. (Patient not taking: Reported on 05/07/2023) 1 each 0   ondansetron (ZOFRAN-ODT) 4 MG disintegrating tablet Take 1 tablet (4 mg total) by mouth every 8 (eight) hours as needed for nausea or vomiting. (Patient not taking: Reported on 06/01/2023) 30 tablet 0   No current facility-administered medications on file prior to visit.     Review of Systems:  All pertinent positive/negative included in HPI, all other review of systems are negative   Objective:  Physical Exam BP 139/83   Pulse 96   Wt (!) 384 lb 9.6 oz (174.5 kg)   LMP 03/07/2023 Comment: irreg bleeding, neg blood test 5/19  BMI 49.38 kg/m  CONSTITUTIONAL: Well-developed, well-nourished female in no acute distress.  EYES: EOM intact ENT: Normocephalic CARDIOVASCULAR: Regular rate  RESPIRATORY: Normal rate.  MUSCULOSKELETAL: Normal ROM SKIN: Warm, dry without erythema  NEUROLOGICAL: Alert, oriented, CN II-XII grossly intact, Appropriate balance PSYCH: Normal behavior, mood  PROCEDURE:  TRIGGER POINT INJECTIONS  Procedure: Mixture of 1%  Lidocaine, marcaine and dexamethazone in a ratio of 2:2:1  injected with 1cc each site in bilateral greater Occipital Nerves, bilateral lesser occipital nerves, bilateral cervical paraspinal muscles, bilateral trapezius.  Total amount injected: 10cc.  Pt tolerated procedure well.    Assessment & Plan:  Assessment: 1. Migraine with aura and with status migrainosus, not intractable   2. Headache in pregnancy, antepartum, first trimester   3. Migraine without aura  and with status migrainosus, not intractable       Plan: Injections today to help break current cycle of unrelenting pain.  Flexeril more regularly to reduce muscle spasm Fioricet to help with acute pain - but must limit to 1-2 days per week to avoid dependence. Other otc options discussed: benadryl, biofreeze.  Follow-up as needed  40 minutes  spent in direct patient care this encounter not including procedural time.   Bertram Denver, PA-C 06/05/2023 8:17 AM

## 2023-06-08 ENCOUNTER — Ambulatory Visit (INDEPENDENT_AMBULATORY_CARE_PROVIDER_SITE_OTHER): Payer: Medicaid Other | Admitting: Family Medicine

## 2023-06-08 ENCOUNTER — Other Ambulatory Visit: Payer: Self-pay

## 2023-06-08 VITALS — BP 104/72 | HR 100 | Wt 380.5 lb

## 2023-06-08 DIAGNOSIS — O99211 Obesity complicating pregnancy, first trimester: Secondary | ICD-10-CM

## 2023-06-08 DIAGNOSIS — O0990 Supervision of high risk pregnancy, unspecified, unspecified trimester: Secondary | ICD-10-CM

## 2023-06-08 DIAGNOSIS — O09299 Supervision of pregnancy with other poor reproductive or obstetric history, unspecified trimester: Secondary | ICD-10-CM

## 2023-06-08 DIAGNOSIS — I10 Essential (primary) hypertension: Secondary | ICD-10-CM

## 2023-06-08 DIAGNOSIS — O99210 Obesity complicating pregnancy, unspecified trimester: Secondary | ICD-10-CM

## 2023-06-08 DIAGNOSIS — O0991 Supervision of high risk pregnancy, unspecified, first trimester: Secondary | ICD-10-CM

## 2023-06-08 DIAGNOSIS — O09291 Supervision of pregnancy with other poor reproductive or obstetric history, first trimester: Secondary | ICD-10-CM

## 2023-06-08 DIAGNOSIS — Z8632 Personal history of gestational diabetes: Secondary | ICD-10-CM

## 2023-06-08 DIAGNOSIS — Z98891 History of uterine scar from previous surgery: Secondary | ICD-10-CM

## 2023-06-08 DIAGNOSIS — Z3A12 12 weeks gestation of pregnancy: Secondary | ICD-10-CM

## 2023-06-08 MED ORDER — ASPIRIN 81 MG PO TBEC
162.0000 mg | DELAYED_RELEASE_TABLET | Freq: Every day | ORAL | 12 refills | Status: DC
Start: 2023-06-08 — End: 2023-12-04

## 2023-06-08 NOTE — Progress Notes (Signed)
PRENATAL VISIT NOTE  Subjective:  Maureen Lewis is a 27 y.o. [id number] at [redacted]w[redacted]d being seen today for ongoing prenatal care.  She is currently monitored for the following issues for this high-risk pregnancy and has GERD (gastroesophageal reflux disease); Obesity hypoventilation syndrome (HCC); Benign paroxysmal positional vertigo due to bilateral vestibular disorder; OSA (obstructive sleep apnea); PCOS (polycystic ovarian syndrome); Vitamin D deficiency; Prediabetes; Depression; Class 3 severe obesity due to excess calories with body mass index (BMI) of 45.0 to 49.9 in adult [facility]); Mass; Vertigo; Alpha thalassemia trait; Migraine headache; HTN (hypertension); Supervision of high risk pregnancy, antepartum; History of gestational diabetes mellitus (GDM) in prior pregnancy, currently pregnant; Obesity in pregnancy; History of C-section; S/P laparoscopic sleeve gastrectomy; Idiopathic intracranial hypertension; Abnormal Pap smear of cervix; and Headache in pregnancy, antepartum, first trimester on their problem list.  Patient reports backache, headache, nausea, and vomiting.  Contractions: Not present. Vag. Bleeding: None.   . Denies leaking of fluid.   The following portions of the patient's history were reviewed and updated as appropriate: allergies, current medications, past family history, past medical history, past social history, past surgical history and problem list.   Objective:   Vitals:   06/08/23 1005  BP: 104/72  Pulse: 100  Weight: (!) 380 lb 8 oz (172.6 kg)    Fetal Status: Fetal Heart Rate (bpm): 160         General:  Alert, oriented and cooperative. Patient is in no acute distress.  Skin: Skin is warm and dry. No rash noted.   Cardiovascular: Normal heart rate noted  Respiratory: Normal respiratory effort, no problems with respiration noted  Abdomen: Soft, gravid, appropriate for gestational age.  Pain/Pressure: Absent     Pelvic: Cervical exam deferred         Extremities: Normal range of motion.  Edema: None  Mental Status: Normal mood and affect. Normal behavior. Normal judgment and thought content.   Assessment and Plan:  Pregnancy: G4P1021 at [redacted]w[redacted]d 1. Supervision of high risk pregnancy, antepartum Genetics today Discussed heat for back ache Has scopolamine and Zofran and phenergan for N/V in pregnancy, she feels as if this is improving Has flexeril for both backache and headache Has h/o IIH but has not been on Diamox. - PANORAMA PRENATAL TEST - aspirin EC 81 MG tablet; Take 2 tablets (162 mg total) by mouth daily. Swallow whole.  Dispense: 180 tablet; Refill: 12  2. History of C-section For TOLAC - will sign consent later in pregnancy  3. History of gestational diabetes mellitus (GDM) in prior pregnancy, currently pregnant Normal early 1 hour  4. Primary hypertension On no meds--increase her baby ASA to 162 mg - aspirin EC 81 MG tablet; Take 2 tablets (162 mg total) by mouth daily. Swallow whole.  Dispense: 180 tablet; Refill: 12  5. Obesity in pregnancy - aspirin EC 81 MG tablet; Take 2 tablets (162 mg total) by mouth daily. Swallow whole.  Dispense: 180 tablet; Refill: 12  General obstetric precautions including but not limited to vaginal bleeding, contractions, leaking of fluid and fetal movement were reviewed in detail with the patient. Please refer to After Visit Summary for other counseling recommendations.   No follow-ups on file.  Future Appointments  Date Time Provider Department Center  06/25/2023 11:00 AM [facility] None  06/25/2023  1:00 PM Charlott Holler, MD [facility] None  07/17/2023  2:40 PM Tobb, Kardie, DO [facility] None  07/27/2023  8:15 AM [facility] NURSE [facility]  07/27/2023  8:30 AM [facility] US3 [facility]    Reva Bores, MD

## 2023-06-09 ENCOUNTER — Encounter: Payer: Medicaid Other | Admitting: Family Medicine

## 2023-06-16 ENCOUNTER — Inpatient Hospital Stay (HOSPITAL_COMMUNITY)
Admission: AD | Admit: 2023-06-16 | Discharge: 2023-06-16 | Disposition: A | Discharge status: Home / Self Care | Payer: Medicaid Other | Attending: Obstetrics & Gynecology | Admitting: Obstetrics & Gynecology

## 2023-06-16 ENCOUNTER — Encounter: Payer: Self-pay | Admitting: Obstetrics and Gynecology

## 2023-06-16 ENCOUNTER — Encounter (HOSPITAL_COMMUNITY): Payer: Self-pay | Admitting: Obstetrics & Gynecology

## 2023-06-16 DIAGNOSIS — R109 Unspecified abdominal pain: Secondary | ICD-10-CM | POA: Diagnosis not present

## 2023-06-16 DIAGNOSIS — Z3A13 13 weeks gestation of pregnancy: Secondary | ICD-10-CM | POA: Insufficient documentation

## 2023-06-16 DIAGNOSIS — O26891 Other specified pregnancy related conditions, first trimester: Secondary | ICD-10-CM | POA: Diagnosis not present

## 2023-06-16 DIAGNOSIS — Z3492 Encounter for supervision of normal pregnancy, unspecified, second trimester: Secondary | ICD-10-CM

## 2023-06-16 DIAGNOSIS — O4691 Antepartum hemorrhage, unspecified, first trimester: Secondary | ICD-10-CM

## 2023-06-16 DIAGNOSIS — O4692 Antepartum hemorrhage, unspecified, second trimester: Secondary | ICD-10-CM

## 2023-06-16 DIAGNOSIS — O209 Hemorrhage in early pregnancy, unspecified: Secondary | ICD-10-CM | POA: Insufficient documentation

## 2023-06-16 LAB — URINALYSIS, ROUTINE W REFLEX MICROSCOPIC
Bacteria, UA: NONE SEEN
Bilirubin Urine: NEGATIVE
Glucose, UA: NEGATIVE mg/dL
Hgb urine dipstick: NEGATIVE
Ketones, ur: 5 mg/dL — AB
Leukocytes,Ua: NEGATIVE
Nitrite: NEGATIVE
Protein, ur: 30 mg/dL — AB
Specific Gravity, Urine: 1.035 — ABNORMAL HIGH (ref 1.005–1.030)
pH: 5 (ref 5.0–8.0)

## 2023-06-16 LAB — PANORAMA PRENATAL TEST FULL PANEL:PANORAMA TEST PLUS 5 ADDITIONAL MICRODELETIONS: FETAL FRACTION: 7.6

## 2023-06-16 LAB — WET PREP, GENITAL
Clue Cells Wet Prep HPF POC: NONE SEEN
Sperm: NONE SEEN
Trich, Wet Prep: NONE SEEN
WBC, Wet Prep HPF POC: >=10 — AB (ref <10)
Yeast Wet Prep HPF POC: NONE SEEN

## 2023-06-16 NOTE — MAU Provider Note (Signed)
History     CSN: [id number]  Arrival date and time: 06/16/23 1457   Event Date/Time   First Provider Initiated Contact with Patient 06/16/23 1714      Chief Complaint  Patient presents with   Vaginal Bleeding   Abdominal Pain   Vaginal Bleeding Associated symptoms include abdominal pain. Pertinent negatives include no back pain, chills, diarrhea, dysuria, fever, flank pain, nausea, rash, sore throat or vomiting.  Abdominal Pain Pertinent negatives include no diarrhea, dysuria, fever, nausea or vomiting.   Patient is 27 y.o. [id number] [redacted]w[redacted]d here with complaints of pink discharge. Denies clots. Denies recent sexual activity or increased activity in general. Recently tested for GC/CT and was negative. No recent partners. Reports no odor, itching other vaginal sx. Denies dysuria or polyuria. Reports mild cramping.    OB History     Gravida  4   Para  1   Term  1   Preterm  0   AB  2   Living  1      SAB  2   IAB  0   Ectopic  0   Multiple  0   Live Births  1           Past Medical History:  Diagnosis Date   Abnormal uterine bleeding    Acid reflux    Alpha thalassemia trait    Amenorrhea    Anemia    no current med.   Asthma    Back pain    Chest pain    resolved   Complication of anesthesia    states had to keep giving her anesthesia during EGD   Constipation    Depression    "I'm good"   Fibromyalgia    Finger mass, left 03/2017   middle finger   Gestational hypertension 09/23/2019   Guidelines for Antenatal Testing and Sonography  (with updated ICD-10 codes)  Updated  2019/09/09 with Dr. Noralee Space  INDICATION U/S 2 X week NST/AFI  or full BPP wkly DELIVERY Diabetes   A1 - good control - O24.410    A2 - good control - O24.419      A2  - poor control or poor compliance - O24.419, E11.65   (Macrosomia or polyhydramnios) **E11.65 is extra code for poor control**    A2/B - O24.   Headache    Hepatitis 07/18/2022   Hepatitis B   History of  kidney stones    Hypertension    Irritable bowel syndrome (IBS)    Joint pain    Morbid obesity with body mass index (BMI) of 45.0 to 49.9 in adult [facility])    Plantar fasciitis, bilateral    resolved   Polycystic ovary syndrome    Pre-diabetes    no meds, diet controlled, diet not check blood sugar   Shortness of breath    albuterol inhaler   Sleep apnea    no CPAP use   Vertigo 2017    Past Surgical History:  Procedure Laterality Date   CESAREAN SECTION N/A 10/09/2019   Procedure: CESAREAN SECTION;  Surgeon: Maricopa Bing, MD;  Location: [facility]S;  Service: Obstetrics;  Laterality: N/A;   COLONOSCOPY WITH PROPOFOL  10/07/2016   DILATION AND CURETTAGE OF UTERUS     ESOPHAGOGASTRODUODENOSCOPY  12/21/2015   EXCISION MASS UPPER EXTREMETIES Left 04/21/2017   Procedure: EXCISION MASS LEFT MIDDLE FINGER;  Surgeon: Cindee Salt, MD;  Location: [facility];  Service: Orthopedics;  Laterality: Left;   gastric  sleeve  05/09/2022   IR FL GUIDED LOC OF NEEDLE/CATH TIP FOR SPINAL INJECTION RT  03/13/2020   KNEE ARTHROSCOPY WITH LATERAL MENISECTOMY Left 10/31/2022   Procedure: KNEE ARTHROSCOPY WITH PARTIAL LATERAL MENISECTOMY;  Surgeon: Yolonda Kida, MD;  Location: [facility] ORS;  Service: Orthopedics;  Laterality: Left;  77   KNEE ARTHROSCOPY WITH LATERAL RELEASE Left 10/31/2022   Procedure: KNEE ARTHROSCOPY WITH LATERAL RELEASE AND LIMITED SYNOVECTOMY;  Surgeon: Yolonda Kida, MD;  Location: [facility] ORS;  Service: Orthopedics;  Laterality: Left;  75   PHOTOCOAGULATION WITH LASER Left 12/13/2020   Procedure: RETINOPLEXY LEFT EYE;  Surgeon: Stephannie Li, MD;  Location[facility] OR;  Service: Ophthalmology;  Laterality: Left;   SCLERAL BUCKLE Right 12/13/2020   Procedure: SCLERAL BUCKLE WITH CRYO;  Surgeon: Stephannie Li, MD;  Location: [facility] OR;  Service: Ophthalmology;  Laterality: Right;    Family History  Problem Relation Age of Onset   Diabetes Maternal Aunt    Hypertension  Maternal Uncle    Depression Maternal Grandfather    Diabetes Maternal Grandfather    Hypertension Maternal Grandfather    Arthritis Mother    High blood pressure Mother    Depression Mother    Sleep apnea Mother    Obesity Mother    Diabetes Mother    Hypertension Mother    Kidney failure Mother    Sleep apnea Father    Obesity Father    Healthy Daughter     Social History   Tobacco Use   Smoking status: Never   Smokeless tobacco: Never  Vaping Use   Vaping status: Never Used  Substance Use Topics   Alcohol use: Not Currently    Comment: social    Drug use: No    Allergies:  Allergies  Allergen Reactions   Bee Venom Hives, Shortness Of Breath and Swelling   Hydrocodone-Acetaminophen Anaphylaxis    No problem when she takes Tylenol   Peach Flavor Hives, Shortness Of Breath and Other (See Comments)    SWELLING OF MOUTH   Pollen Extract Hives, Shortness Of Breath and Swelling   Remdesivir Swelling    Angioedema 8/25   Latex Rash   Cortisone Itching and Swelling   Cortizone-10 [Hydrocortisone]     Swelling and itching   Toradol [Ketorolac Tromethamine] Swelling    Medications Prior to Admission  Medication Sig Dispense Refill Last Dose   aspirin EC 81 MG tablet Take 2 tablets (162 mg total) by mouth daily. Swallow whole. 180 tablet 12 06/15/2023   cyclobenzaprine (FLEXERIL) 10 MG tablet Take 1 tablet (10 mg total) by mouth 3 (three) times daily as needed for muscle spasms. 40 tablet 2 06/16/2023   famotidine (PEPCID) 20 MG tablet Take 1 tablet (20 mg total) by mouth 2 (two) times daily. 30 tablet 0 06/16/2023   fluticasone (FLONASE) 50 MCG/ACT nasal spray Place 2 sprays into both nostrils daily. 16 g 6 Past Week   loratadine (CLARITIN) 10 MG tablet Take 10 mg by mouth daily as needed for allergies.    Past Week   metoCLOPramide (REGLAN) 10 MG tablet Take 1 tablet (10 mg total) by mouth every 6 (six) hours. 30 tablet 0 06/16/2023   ondansetron (ZOFRAN-ODT) 4 MG  disintegrating tablet Take 1 tablet (4 mg total) by mouth every 8 (eight) hours as needed for nausea or vomiting. 30 tablet 0 Past Week   scopolamine (TRANSDERM-SCOP) 1 MG/3DAYS Place 1 patch (1.5 mg total) onto the skin every 3 (three) days. 10 patch  6 Past Week   albuterol (VENTOLIN HFA) 108 (90 Base) MCG/ACT inhaler Inhale 1 puff into the lungs every 4 (four) hours as needed for shortness of breath.      Blood Pressure Monitoring (BLOOD PRESSURE KIT) DEVI 1 Device by Does not apply route as needed. 1 each 0    butalbital-acetaminophen-caffeine (FIORICET) 50-325-40 MG tablet Take 1-2 tablets by mouth every 6 (six) hours as needed for headache. (Patient not taking: Reported on 06/08/2023) 40 tablet 1    Prenatal Vit-Fe Phos-FA-Omega (VITAFOL GUMMIES) 3.33-0.333-34.8 MG CHEW Chew 1 tablet by mouth daily. 90 tablet 3    promethazine (PHENERGAN) 25 MG suppository Place 1 suppository (25 mg total) rectally every 6 (six) hours as needed for nausea or vomiting. 12 each 0    triamcinolone (KENALOG) 0.025 % ointment Apply 1 Application topically 2 (two) times daily as needed (itching). 30 g 0     Review of Systems  Constitutional:  Negative for chills and fever.  HENT:  Negative for congestion and sore throat.   Eyes:  Negative for pain and visual disturbance.  Respiratory:  Negative for cough, chest tightness and shortness of breath.   Cardiovascular:  Negative for chest pain.  Gastrointestinal:  Positive for abdominal pain. Negative for diarrhea, nausea and vomiting.  Endocrine: Negative for cold intolerance and heat intolerance.  Genitourinary:  Positive for vaginal bleeding. Negative for dysuria and flank pain.  Musculoskeletal:  Negative for back pain.  Skin:  Negative for rash.  Allergic/Immunologic: Negative for food allergies.  Neurological:  Negative for dizziness and light-headedness.  Psychiatric/Behavioral:  Negative for agitation.    Physical Exam   Blood pressure 126/67, pulse (!)  102, temperature 98.6 F (37 C), resp. rate 18, height 6\' 2"  (1.88 m), weight (!) 172.4 kg, last menstrual period 03/07/2023.  FHR = 157  Physical Exam Vitals and nursing note reviewed.  Constitutional:      General: She is not in acute distress.    Appearance: She is well-developed.  HENT:     Head: Normocephalic and atraumatic.  Eyes:     General: No scleral icterus.    Conjunctiva/sclera: Conjunctivae normal.  Cardiovascular:     Rate and Rhythm: Normal rate.  Pulmonary:     Effort: Pulmonary effort is normal.  Chest:     Chest wall: No tenderness.  Abdominal:     Palpations: Abdomen is soft.     Tenderness: There is no abdominal tenderness. There is no guarding or rebound.  Genitourinary:    Vagina: Normal.  Musculoskeletal:        General: Normal range of motion.     Cervical back: Normal range of motion and neck supple.  Skin:    General: Skin is warm and dry.     Findings: No rash.  Neurological:     Mental Status: She is alert and oriented to person, place, and time.     MAU Course  Procedures  MDM: low  This patient presents to the ED for concern of   Chief Complaint  Patient presents with   Vaginal Bleeding   Abdominal Pain     These complains involves an extensive number of treatment options, and is a complaint that carries with it a high risk of complications and morbidity.  The differential diagnosis for  1. Vaginal bleeding INCLUDES normal variant/cervical friability, miscarriage, pregnancy loss, less likely infection.    Co morbidities that complicate the patient evaluation: Patient Active Problem List   Diagnosis Date Noted  Headache in pregnancy, antepartum, first trimester 06/05/2023   Abnormal Pap smear of cervix 05/13/2023   Supervision of high risk pregnancy, antepartum 04/30/2023   History of gestational diabetes mellitus (GDM) in prior pregnancy, currently pregnant 04/30/2023   Obesity in pregnancy 04/30/2023   History of C-section  04/30/2023   S/P laparoscopic sleeve gastrectomy 05/20/2022   Idiopathic intracranial hypertension 06/13/2021   HTN (hypertension) 09/23/2019   Migraine headache 07/12/2019   Alpha thalassemia trait    Vitamin D deficiency 09/16/2017   Prediabetes 09/16/2017   Depression 09/16/2017   Class 3 severe obesity due to excess calories with body mass index (BMI) of 45.0 to 49.9 in adult (HCC) 09/16/2017   PCOS (polycystic ovarian syndrome) 08/13/2017   Mass 05/06/2017   Vertigo 10/06/2016   Obesity hypoventilation syndrome (HCC) 08/28/2016   Benign paroxysmal positional vertigo due to bilateral vestibular disorder 08/28/2016   OSA (obstructive sleep apnea) 08/28/2016   GERD (gastroesophageal reflux disease) 02/24/2015     External records from outside source obtained and reviewed including Prenatal care records  Lab Tests: UA and Wet prep  I ordered, and personally interpreted labs.  The pertinent results include:   Results for orders placed or performed during the hospital encounter of 06/16/23 (from the past 24 hour(s))  Wet prep, genital     Status: Abnormal   Collection Time: 06/16/23  3:37 PM   Specimen: Urine, Clean Catch  Result Value Ref Range   Yeast Wet Prep HPF POC NONE SEEN NONE SEEN   Trich, Wet Prep NONE SEEN NONE SEEN   Clue Cells Wet Prep HPF POC NONE SEEN NONE SEEN   WBC, Wet Prep HPF POC >=10 (A) <10   Sperm NONE SEEN   Urinalysis, Routine w reflex microscopic -Urine, Clean Catch     Status: Abnormal   Collection Time: 06/16/23  3:37 PM  Result Value Ref Range   Color, Urine AMBER (A) YELLOW   APPearance HAZY (A) CLEAR   Specific Gravity, Urine 1.035 (H) 1.005 - 1.030   pH 5.0 5.0 - 8.0   Glucose, UA NEGATIVE NEGATIVE mg/dL   Hgb urine dipstick NEGATIVE NEGATIVE   Bilirubin Urine NEGATIVE NEGATIVE   Ketones, ur 5 (A) NEGATIVE mg/dL   Protein, ur 30 (A) NEGATIVE mg/dL   Nitrite NEGATIVE NEGATIVE   Leukocytes,Ua NEGATIVE NEGATIVE   RBC / HPF 0-5 0 - 5 RBC/hpf    WBC, UA 0-5 0 - 5 WBC/hpf   Bacteria, UA NONE SEEN NONE SEEN   Squamous Epithelial / HPF 0-5 0 - 5 /HPF   Mucus PRESENT    Ca Oxalate Crys, UA PRESENT    *Note: Due to a large number of results and/or encounters for the requested time period, some results have not been displayed. A complete set of results can be found in Results Review.     After the interventions noted above, I reevaluated the patient and found that they have :improved  Dispostion: discharged   Assessment and Plan   1. Vaginal bleeding in pregnancy, second trimester   2. Viable pregnancy in second trimester   3. [redacted] weeks gestation of pregnancy    - Likely cervical friability - Reviewed return precautions - Discharge home   Federico Flake 06/16/2023, 5:14 PM

## 2023-06-16 NOTE — MAU Note (Signed)
.  Maureen Lewis is a 27 y.o. at [redacted]w[redacted]d here in MAU reporting: pelvic/vaginal pain that has been increasing over the past 3-4 days.pain I sharp and crampy. Noticed some pink spotting when wiping today. Denies any recent intercourse.  LMP:  Onset of complaint: 3-4 days Pain score: 7-8 Vitals:   06/16/23 1518  BP: 126/67  Pulse: (!) 102  Resp: 18  Temp: 98.6 F (37 C)     FHT:157 Lab orders placed from triage:

## 2023-06-18 ENCOUNTER — Telehealth: Payer: Self-pay

## 2023-06-18 DIAGNOSIS — R102 Pelvic and perineal pain: Secondary | ICD-10-CM

## 2023-06-18 NOTE — Telephone Encounter (Signed)
Called pt in response to MyChart message.  She reports increasing abdominal pain over the last 2 days since MAU visit. She describes intermittent pain that is sometimes as severe at 9/10. Denies any burning with urination, vaginal odor, or vaginal itching. Reports regular BM twice a day. Describes pain is sometimes sharp and stabbing and sometimes achy. This has woken her from sleep. She has tried applying heat, Tylenol, Flexeril, and oxycodone (leftover from a previous prescription) with no improvement. Describes vaginal discharge with pink tinge but no vaginal bleeding. Seen at MAU 2 days ago and wet prep normal, FHR heard on doppler, and UA completed which showed ketones, protein, and abnormal SG. Has previously had negative GC/CT. Reviewed with Danella Deis CNM who recommends for patient to be seen in office tomorrow for urine culture and repeat vaginal swab or if patient is concerned, she may be seen at MAU tonight. Provider would recommend return to MAU for any vaginal bleeding as this would need further work up. Reivewed options with patient who feels comfortable waiting until the morning for urine collection. Pt provides additional information that she has not had vaginal intercourse since becoming pregnant so she is not concerned about any new STD or injury from intercourse.

## 2023-06-18 NOTE — Telephone Encounter (Signed)
Called pt in response to MyChart message. 

## 2023-06-19 ENCOUNTER — Other Ambulatory Visit: Payer: Medicaid Other

## 2023-06-19 ENCOUNTER — Encounter: Payer: Self-pay | Admitting: Family Medicine

## 2023-06-19 ENCOUNTER — Other Ambulatory Visit: Payer: Self-pay

## 2023-06-19 DIAGNOSIS — R102 Pelvic and perineal pain: Secondary | ICD-10-CM | POA: Diagnosis not present

## 2023-06-21 LAB — URINE CULTURE, OB REFLEX

## 2023-06-21 LAB — CULTURE, OB URINE

## 2023-06-23 DIAGNOSIS — M25562 Pain in left knee: Secondary | ICD-10-CM | POA: Diagnosis not present

## 2023-06-24 DIAGNOSIS — F439 Reaction to severe stress, unspecified: Secondary | ICD-10-CM | POA: Diagnosis not present

## 2023-06-25 ENCOUNTER — Ambulatory Visit: Payer: Medicaid Other | Admitting: Internal Medicine

## 2023-07-01 ENCOUNTER — Encounter: Payer: Self-pay | Admitting: *Deleted

## 2023-07-01 DIAGNOSIS — M25562 Pain in left knee: Secondary | ICD-10-CM | POA: Diagnosis not present

## 2023-07-02 DIAGNOSIS — F439 Reaction to severe stress, unspecified: Secondary | ICD-10-CM | POA: Diagnosis not present

## 2023-07-06 ENCOUNTER — Encounter: Payer: Self-pay | Admitting: General Practice

## 2023-07-06 ENCOUNTER — Encounter: Payer: Self-pay | Admitting: Internal Medicine

## 2023-07-09 DIAGNOSIS — M25562 Pain in left knee: Secondary | ICD-10-CM | POA: Diagnosis not present

## 2023-07-13 ENCOUNTER — Ambulatory Visit (INDEPENDENT_AMBULATORY_CARE_PROVIDER_SITE_OTHER): Payer: Medicaid Other | Admitting: Obstetrics and Gynecology

## 2023-07-13 ENCOUNTER — Other Ambulatory Visit: Payer: Self-pay

## 2023-07-13 VITALS — BP 129/72 | HR 88 | Wt 374.3 lb

## 2023-07-13 DIAGNOSIS — O99210 Obesity complicating pregnancy, unspecified trimester: Secondary | ICD-10-CM

## 2023-07-13 DIAGNOSIS — Z9884 Bariatric surgery status: Secondary | ICD-10-CM

## 2023-07-13 DIAGNOSIS — Z3A17 17 weeks gestation of pregnancy: Secondary | ICD-10-CM

## 2023-07-13 DIAGNOSIS — R519 Headache, unspecified: Secondary | ICD-10-CM

## 2023-07-13 DIAGNOSIS — O26892 Other specified pregnancy related conditions, second trimester: Secondary | ICD-10-CM

## 2023-07-13 DIAGNOSIS — Z23 Encounter for immunization: Secondary | ICD-10-CM

## 2023-07-13 DIAGNOSIS — O0992 Supervision of high risk pregnancy, unspecified, second trimester: Secondary | ICD-10-CM

## 2023-07-13 DIAGNOSIS — R634 Abnormal weight loss: Secondary | ICD-10-CM

## 2023-07-13 DIAGNOSIS — Z6841 Body Mass Index (BMI) 40.0 and over, adult: Secondary | ICD-10-CM

## 2023-07-13 DIAGNOSIS — O99212 Obesity complicating pregnancy, second trimester: Secondary | ICD-10-CM

## 2023-07-13 DIAGNOSIS — Z98891 History of uterine scar from previous surgery: Secondary | ICD-10-CM

## 2023-07-13 DIAGNOSIS — I10 Essential (primary) hypertension: Secondary | ICD-10-CM

## 2023-07-13 DIAGNOSIS — G4733 Obstructive sleep apnea (adult) (pediatric): Secondary | ICD-10-CM

## 2023-07-13 DIAGNOSIS — O0990 Supervision of high risk pregnancy, unspecified, unspecified trimester: Secondary | ICD-10-CM

## 2023-07-13 DIAGNOSIS — O26891 Other specified pregnancy related conditions, first trimester: Secondary | ICD-10-CM

## 2023-07-13 NOTE — Progress Notes (Signed)
PRENATAL VISIT NOTE  Subjective:  Maureen Lewis is a 27 y.o. [id number] at [redacted]w[redacted]d being seen today for ongoing prenatal care.  She is currently monitored for the following issues for this high-risk pregnancy and has GERD (gastroesophageal reflux disease); Obesity hypoventilation syndrome (HCC); Benign paroxysmal positional vertigo due to bilateral vestibular disorder; OSA (obstructive sleep apnea); PCOS (polycystic ovarian syndrome); Vitamin D deficiency; Prediabetes; Depression; Class 3 severe obesity due to excess calories with body mass index (BMI) of 45.0 to 49.9 in adult [facility]); Mass; Vertigo; Alpha thalassemia trait; Migraine headache; HTN (hypertension); Supervision of high risk pregnancy, antepartum; History of gestational diabetes mellitus (GDM) in prior pregnancy, currently pregnant; Obesity in pregnancy; History of C-section; S/P laparoscopic sleeve gastrectomy; Idiopathic intracranial hypertension; Abnormal Pap smear of cervix; and Headache in pregnancy, antepartum, first trimester on their problem list.  Patient reports  morning sickness s/s , low back cramping and discomfort stable; pt has had negative MAU evaluation, including neg ucx.  Contractions: Irritability. Vag. Bleeding: None.  Movement: Present. Denies leaking of fluid.   The following portions of the patient's history were reviewed and updated as appropriate: allergies, current medications, past family history, past medical history, past social history, past surgical history and problem list.   Objective:   Vitals:   07/13/23 0859  BP: 129/72  Pulse: 88  Weight: (!) 374 lb 4.8 oz (169.8 kg)    Fetal Status: Fetal Heart Rate (bpm): 147   Movement: Present     General:  Alert, oriented and cooperative. Patient is in no acute distress.  Skin: Skin is warm and dry. No rash noted.   Cardiovascular: Normal heart rate noted  Respiratory: Normal respiratory effort, no problems with respiration noted  Abdomen:  Soft, gravid, appropriate for gestational age.  Pain/Pressure: Present     Pelvic: Cervical exam performed in the presence of a chaperone       Spec exam negative. Cervix c/l/h  Extremities: Normal range of motion.  Edema: Trace  Mental Status: Normal mood and affect. Normal behavior. Normal judgment and thought content.   Assessment and Plan:  Pregnancy: G4P1021 at [redacted]w[redacted]d 1. Flu vaccine need - Flu vaccine trivalent PF, 6mos and older(Flulaval,Afluria,Fluarix,Fluzone)  2. Supervision of high risk pregnancy, antepartum Pt amenable to afp today  3. S/P laparoscopic sleeve gastrectomy D/w her that recent sleeve (July 2023) and now pregnancy may be making her morning sickness worse than normal; she has bariatric nutrition appt in the next week. ED precautions given Baseline labs, electrolytes wnl at new ob  4. [redacted] weeks gestation of pregnancy Exam negative, mau precautions.  Already scheduled for anatomy u/s Continue low dose ASA - AFP, Serum, Open Spina Bifida  5. BMI 45.0-49.9, adult (HCC) - AFP, Serum, Open Spina Bifida  6. Obesity in pregnancy  7. Primary hypertension ?resolved. Not on meds  8. OSA (obstructive sleep apnea) Pt states resolved and doesn't need to wear mask  9. History of C-section  10. Headache in pregnancy, antepartum, first trimester stable  11. Weight loss See above  Preterm labor symptoms and general obstetric precautions including but not limited to vaginal bleeding, contractions, leaking of fluid and fetal movement were reviewed in detail with the patient. Please refer to After Visit Summary for other counseling recommendations.   Return in about 1 month (around 08/12/2023) for in person, md or app, high risk ob.  Future Appointments  Date Time Provider Department Center  07/17/2023  2:40 PM Thomasene Ripple, DO [facility] None  07/27/2023  8:15 AM [facility] NURSE [facility]  07/27/2023  8:30 AM [facility] US3 [facility]  08/24/2023  9:00 AM CENTERING  PROVIDER [facility]  09/21/2023  9:00 AM CENTERING PROVIDER [facility]  10/19/2023  9:00 AM CENTERING PROVIDER [facility]  11/02/2023  9:00 AM CENTERING PROVIDER [facility]  11/16/2023  9:00 AM CENTERING PROVIDER [facility]  11/30/2023  9:00 AM CENTERING PROVIDER [facility]  12/14/2023  9:00 AM CENTERING PROVIDER [facility]  12/28/2023  9:00 AM CENTERING PROVIDER [facility]    Peletier Bing, MD

## 2023-07-14 DIAGNOSIS — M25562 Pain in left knee: Secondary | ICD-10-CM | POA: Diagnosis not present

## 2023-07-16 LAB — AFP, SERUM, OPEN SPINA BIFIDA
AFP MoM: 0.85
AFP Value: 22.4 ng/mL
Gest. Age on Collection Date: 17.1 weeks
Maternal Age At EDD: 25.4 yr
OSBR Risk 1 IN: 10000
Test Results:: NEGATIVE
Weight: 374 [lb_av]

## 2023-07-17 ENCOUNTER — Ambulatory Visit: Payer: Medicaid Other | Attending: Cardiology | Admitting: Cardiology

## 2023-07-17 ENCOUNTER — Encounter: Payer: Self-pay | Admitting: Cardiology

## 2023-07-17 VITALS — BP 114/70 | HR 88 | Ht 74.0 in | Wt 379.2 lb

## 2023-07-17 DIAGNOSIS — R002 Palpitations: Secondary | ICD-10-CM | POA: Diagnosis not present

## 2023-07-17 DIAGNOSIS — I10 Essential (primary) hypertension: Secondary | ICD-10-CM | POA: Diagnosis not present

## 2023-07-17 NOTE — Patient Instructions (Signed)
Medication Instructions:  Your physician recommends that you continue on your current medications as directed. Please refer to the Current Medication list given to you today.    *If you need a refill on your cardiac medications before your next appointment, please call your pharmacy*     Testing/Procedures: ZIO XT- Long Term Monitor Instructions  Your physician has requested you wear a ZIO patch monitor for 14 days.  This is a single patch monitor. [employer] supplies one patch monitor per enrollment. Additional stickers are not available. Please do not apply patch if you will be having a Nuclear Stress Test,  Echocardiogram, Cardiac CT, MRI, or Chest Xray during the period you would be wearing the  monitor. The patch cannot be worn during these tests. You cannot remove and re-apply the  ZIO XT patch monitor.  Your ZIO patch monitor will be mailed 3 day USPS to your address on file. It may take 3-5 days  to receive your monitor after you have been enrolled.  Once you have received your monitor, please review the enclosed instructions. Your monitor  has already been registered assigning a specific monitor serial # to you.  Billing and Patient Assistance Program Information  We have supplied [employer] with any of your insurance information on file for billing purposes. [employer] offers a sliding scale Patient Assistance Program for patients that do not have  insurance, or whose insurance does not completely cover the cost of the ZIO monitor.  You must apply for the Patient Assistance Program to qualify for this discounted rate.  To apply, please call [employer] at [phone], select option 4, select option 2, ask to apply for  Patient Assistance Program. Meredeth Ide will ask your household income, and how many people  are in your household. They will quote your out-of-pocket cost based on that information.  [employer] will also be able to set up a 76-month, interest-free payment plan if  needed.  Applying the monitor   Shave hair from upper left chest.  Hold abrader disc by orange tab. Rub abrader in 40 strokes over the upper left chest as  indicated in your monitor instructions.  Clean area with 4 enclosed alcohol pads. Let dry.  Apply patch as indicated in monitor instructions. Patch will be placed under collarbone on left  side of chest with arrow pointing upward.  Rub patch adhesive wings for 2 minutes. Remove white label marked "1". Remove the white  label marked "2". Rub patch adhesive wings for 2 additional minutes.  While looking in a mirror, press and release button in center of patch. A small green light will  flash 3-4 times. This will be your only indicator that the monitor has been turned on.  Do not shower for the first 24 hours. You may shower after the first 24 hours.  Press the button if you feel a symptom. You will hear a small click. Record Date, Time and  Symptom in the Patient Logbook.  When you are ready to remove the patch, follow instructions on the last 2 pages of Patient  Logbook. Stick patch monitor onto the last page of Patient Logbook.  Place Patient Logbook in the blue and white box. Use locking tab on box and tape box closed  securely. The blue and white box has prepaid postage on it. Please place it in the mailbox as  soon as possible. Your physician should have your test results approximately 7 days after the  monitor has been mailed back to [facility].  Call  [employer] Customer Care at [phone] if you have questions regarding  your ZIO XT patch monitor. Call them immediately if you see an orange light blinking on your  monitor.  If your monitor falls off in less than 4 days, contact our Monitor department at [phone].  If your monitor becomes loose or falls off after 4 days call [employer] at [phone] for  suggestions on securing your monitor    Follow-Up: At [facility], you and your health needs are our  priority.  As part of our continuing mission to provide you with exceptional heart care, we have created designated Provider Care Teams.  These Care Teams include your primary Cardiologist (physician) and Advanced Practice Providers (APPs -  Physician Assistants and Nurse Practitioners) who all work together to provide you with the care you need, when you need it.  We recommend signing up for the patient portal called "MyChart".  Sign up information is provided on this After Visit Summary.  MyChart is used to connect with patients for Virtual Visits (Telemedicine).  Patients are able to view lab/test results, encounter notes, upcoming appointments, etc.  Non-urgent messages can be sent to your provider as well.   To learn more about what you can do with MyChart, go to [email or web address].    Your next appointment:   4 month(s)  The format for your next appointment:   In Person  Provider:   Thomasene Ripple, DO    Other Instructions

## 2023-07-17 NOTE — Progress Notes (Unsigned)
Cardio-Obstetrics Clinic  New Evaluation  Date:  07/20/2023   ID:  Maureen Lewis, DOB [birth year], MRN [id number]  PCP:  Shirline Frees, NP   [facility] Providers Cardiologist:  Thomasene Ripple, DO  Electrophysiologist:  None       Referring MD: Shirline Frees, NP   Chief Complaint: " I am   History of Present Illness:    Maureen Lewis is a 27 y.o. female [G4P1021] who is being seen today for the evaluation of palpitations at the request of Shirline Frees, NP.   She is 18-week pregnant patient with a history of hypertension and gestational diabetes, presents with concerns about a persistently high heart rate and low blood pressure. She reports feeling constantly lightheaded and dizzy, symptoms that started with her pregnancy. The highest recorded heart rate has been around 145-150, and it never stays under 100. Her blood pressure fluctuates, with systolic readings between 110 and 125, and diastolic readings between 40 and 98.  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 She reports her knee kept buckling, causing her to fall and catch herself, leading her doctor to recommend physical therapy to regain strength in her leg.  This is Maureen Lewis's second pregnancy. Her first child, a daughter, was delivered via C-section due to hypertension, gestational diabetes, and the baby's heart rate dropping, indicating distress. Maureen Lewis had high blood pressure for two years following her first pregnancy   Prior CV Studies Reviewed: The following studies were reviewed today:   Past Medical History:  Diagnosis Date   Abnormal uterine bleeding    Acid reflux    Alpha thalassemia trait    Amenorrhea    Anemia    no current med.   Asthma    Back pain    Chest pain    resolved   Complication of anesthesia    states had to  keep giving her anesthesia during EGD   Constipation    Depression    "I'm good"   Fibromyalgia    Finger mass, left 03/2017   middle finger   Gestational hypertension 09/23/2019   Guidelines for Antenatal Testing and Sonography  (with updated ICD-10 codes)  Updated  September 26, 2019 with Dr. Noralee Space  INDICATION U/S 2 X week NST/AFI  or full BPP wkly DELIVERY Diabetes   A1 - good control - O24.410    A2 - good control - O24.419      A2  - poor control or poor compliance - O24.419, E11.65   (Macrosomia or polyhydramnios) **E11.65 is extra code for poor control**    A2/B - O24.   Headache    Hepatitis 07/18/2022   Hepatitis B   History of kidney stones    Hypertension    Irritable bowel syndrome (IBS)    Joint pain    Morbid obesity with body mass index (BMI) of 45.0 to 49.9 in adult [facility])    Plantar fasciitis, bilateral    resolved   Polycystic ovary syndrome    Pre-diabetes    no meds, diet controlled, diet not check blood sugar   Shortness of breath    albuterol inhaler   Sleep apnea    no CPAP use   Vertigo 2017    Past Surgical History:  Procedure Laterality Date   CESAREAN SECTION N/A 10/09/2019   Procedure: CESAREAN SECTION;  Surgeon: Obion Bing, MD;  Location: [facility] LD ORS;  Service: Obstetrics;  Laterality: N/A;   COLONOSCOPY WITH PROPOFOL  10/07/2016   DILATION AND CURETTAGE OF UTERUS     ESOPHAGOGASTRODUODENOSCOPY  12/21/2015   EXCISION MASS UPPER EXTREMETIES Left 04/21/2017   Procedure: EXCISION MASS LEFT MIDDLE FINGER;  Surgeon: Cindee Salt, MD;  Location: [facility];  Service: Orthopedics;  Laterality: Left;   gastric sleeve  05/09/2022   IR FL GUIDED LOC OF NEEDLE/CATH TIP FOR SPINAL INJECTION RT  03/13/2020   KNEE ARTHROSCOPY WITH LATERAL MENISECTOMY Left 10/31/2022   Procedure: KNEE ARTHROSCOPY WITH PARTIAL LATERAL MENISECTOMY;  Surgeon: Yolonda Kida, MD;  Location: [facility] ORS;  Service: Orthopedics;  Laterality: Left;  75   KNEE  ARTHROSCOPY WITH LATERAL RELEASE Left 10/31/2022   Procedure: KNEE ARTHROSCOPY WITH LATERAL RELEASE AND LIMITED SYNOVECTOMY;  Surgeon: Yolonda Kida, MD;  Location: [facility] ORS;  Service: Orthopedics;  Laterality: Left;  75   PHOTOCOAGULATION WITH LASER Left 12/13/2020   Procedure: RETINOPLEXY LEFT EYE;  Surgeon: Stephannie Li, MD;  Location: [facility] OR;  Service: Ophthalmology;  Laterality: Left;   SCLERAL BUCKLE Right 12/13/2020   Procedure: SCLERAL BUCKLE WITH CRYO;  Surgeon: Stephannie Li, MD;  Location: [facility] OR;  Service: Ophthalmology;  Laterality: Right;      OB History     Gravida  4   Para  1   Term  1   Preterm  0   AB  2   Living  1      SAB  2   IAB  0   Ectopic  0   Multiple  0   Live Births  1               Current Medications: Current Meds  Medication Sig   albuterol (VENTOLIN HFA) 108 (90 Base) MCG/ACT inhaler Inhale 1 puff into the lungs every 4 (four) hours as needed for shortness of breath.   aspirin EC 81 MG tablet Take 2 tablets (162 mg total) by mouth daily. Swallow whole.   Blood Pressure Monitoring (BLOOD PRESSURE KIT) DEVI 1 Device by Does not apply route as needed.   cyclobenzaprine (FLEXERIL) 10 MG tablet Take 1 tablet (10 mg total) by mouth 3 (three) times daily as needed for muscle spasms.   famotidine (PEPCID) 20 MG tablet Take 1 tablet (20 mg total) by mouth 2 (two) times daily.   fluticasone (FLONASE) 50 MCG/ACT nasal spray Place 2 sprays into both nostrils daily.   loratadine (CLARITIN) 10 MG tablet Take 10 mg by mouth daily as needed for allergies.   ondansetron (ZOFRAN-ODT) 4 MG disintegrating tablet Take 1 tablet (4 mg total) by mouth every 8 (eight) hours as needed for nausea or vomiting.   Prenatal Vit-Fe Phos-FA-Omega (VITAFOL GUMMIES) 3.33-0.333-34.8 MG CHEW Chew 1 tablet by mouth daily.   scopolamine (TRANSDERM-SCOP) 1 MG/3DAYS Place 1 patch (1.5 mg total) onto the skin every 3 (three) days.   triamcinolone (KENALOG) 0.025 %  ointment Apply 1 Application topically 2 (two) times daily as needed (itching).     Allergies:   Bee venom, Hydrocodone-acetaminophen, Peach flavor, Pollen extract, Remdesivir, Latex, Cortisone, Cortizone-10 [hydrocortisone], and Toradol [ketorolac tromethamine]   Social History   Socioeconomic History   Marital status: Single    Spouse name: Not on file   Number of children: Not on file   Years of education: Not on file   Highest education level: Not on file  Occupational History  Occupation: Training and development officer: [employer]  Tobacco Use   Smoking status: Never   Smokeless tobacco: Never  Vaping Use   Vaping status: Never Used  Substance and Sexual Activity   Alcohol use: Not Currently    Comment: social    Drug use: No   Sexual activity: Not Currently    Birth control/protection: None  Other Topics Concern   Not on file  Social History Narrative   Right handed    Soda sometimes   Lives with mom and cousin, a grandmother in [place] also helps care for her.       She is in nursing school.    Social Determinants of Health   Financial Resource Strain: High Risk (11/05/2022)   Received from [facility], [facility]   Overall Financial Resource Strain (CARDIA)    Difficulty of Paying Living Expenses: Hard  Food Insecurity: No Food Insecurity (05/07/2023)   Hunger Vital Sign    Worried About Running Out of Food in the Last Year: Never true    Ran Out of Food in the Last Year: Never true  Transportation Needs: No Transportation Needs (05/07/2023)   PRAPARE - Administrator, Civil Service (Medical): No    Lack of Transportation (Non-Medical): No  Physical Activity: Inactive (11/05/2022)   Received from [facility], [facility]   Exercise Vital Sign    Days of Exercise per Week: 0 days    Minutes of Exercise per Session: 40 min  Stress: Stress Concern Present (11/05/2022)   Received from [facility], [facility]  [facility] - Occupational Stress Questionnaire    Feeling of Stress : Rather much  Social Connections: Somewhat Isolated (11/05/2022)   Received from [facility], [facility]   Social Network    How would you rate your social network (family, work, friends)?: Restricted participation with some degree of social isolation      Family History  Problem Relation Age of Onset   Diabetes Maternal Aunt    Hypertension Maternal Uncle    Depression Maternal Grandfather    Diabetes Maternal Grandfather    Hypertension Maternal Grandfather    Arthritis Mother    High blood pressure Mother    Depression Mother    Sleep apnea Mother    Obesity Mother    Diabetes Mother    Hypertension Mother    Kidney failure Mother    Sleep apnea Father    Obesity Father    Healthy Daughter       ROS:   Please see the history of present illness.     All other systems reviewed and are negative.   Labs/EKG Reviewed:    EKG:   EKG was ordered today.  The ekg ordered today demonstrates sinus rhyhtm Hr 82 bpm  Recent Labs: 10/02/2022: Pro B Natriuretic peptide (BNP) 15.0 05/07/2023: TSH 0.661 06/01/2023: ALT 11; BUN 5; Creatinine, Ser 0.45; Hemoglobin 11.7; Magnesium 1.8; Platelets 195; Potassium 4.3; Sodium 135   Recent Lipid Panel Lab Results  Component Value Date/Time   CHOL 118 11/08/2020 11:31 AM   CHOL 156 04/09/2020 12:52 PM   TRIG 64.0 11/08/2020 11:31 AM   HDL 36.00 (L) 11/08/2020 11:31 AM   HDL 42 04/09/2020 12:52 PM   CHOLHDL 3 11/08/2020 11:31 AM   LDLCALC 69 11/08/2020 11:31 AM   LDLCALC 101 (H) 04/09/2020 12:52 PM    Physical Exam:    VS:  BP 114/70 (BP Location:  Left Arm, Patient Position: Sitting, Cuff Size: Large)   Pulse 88   Ht 6\' 2"  (1.88 m)   Wt (!) 379 lb 3.2 oz (172 kg)   LMP 03/07/2023 Comment: irreg bleeding, neg blood test 5/19  SpO2 96%   BMI 48.69 kg/m     Wt Readings from Last 3 Encounters:  07/17/23 (!) 379 lb 3.2 oz (172 kg)  07/13/23 (!)  374 lb 4.8 oz (169.8 kg)  06/16/23 (!) 380 lb (172.4 kg)     GEN:  Well nourished, well developed in no acute distress HEENT: Normal NECK: No JVD; No carotid bruits LYMPHATICS: No lymphadenopathy CARDIAC: RRR, no murmurs, rubs, gallops RESPIRATORY:  Clear to auscultation without rales, wheezing or rhonchi  ABDOMEN: Soft, non-tender, non-distended MUSCULOSKELETAL:  No edema; No deformity  SKIN: Warm and dry NEUROLOGIC:  Alert and oriented x 3 PSYCHIATRIC:  Normal affect    Risk Assessment/Risk Calculators:     CARPREG II Risk Prediction Index Score:  1.  The patient's risk for a primary cardiac event is 5%.   Modified World Health Organization [facility]) Classification of Maternal CV Risk   Class I         ASSESSMENT & PLAN:    Tachycardia in Pregnancy Persistent tachycardia with rates up to 145-150 bpm, associated with lightheadedness and dizziness. No chest pain or shortness of breath. Order a 2-week heart monitor to assess rhythm and rate. -Plan to review results and consider treatment options to maintain a steady heart rate, balancing the needs of the patient and fetus.  Hypertension in Previous Pregnancy History of hypertension and gestational diabetes in previous pregnancy, which resulted in a C-section at 37 weeks due to fetal distress. Hypertension persisted for two years postpartum. -Monitor blood pressure closely throughout current pregnancy. -Plan for potential management of hypertension if it recurs.  Follow-up in 12 weeks to reassess heart rate and blood pressure control.  Morbid obesity - lifestyle modification   Patient Instructions  Medication Instructions:  Your physician recommends that you continue on your current medications as directed. Please refer to the Current Medication list given to you today.    *If you need a refill on your cardiac medications before your next appointment, please call your pharmacy*     Testing/Procedures: ZIO XT- Long  Term Monitor Instructions  Your physician has requested you wear a ZIO patch monitor for 14 days.  This is a single patch monitor. [employer] supplies one patch monitor per enrollment. Additional stickers are not available. Please do not apply patch if you will be having a Nuclear Stress Test,  Echocardiogram, Cardiac CT, MRI, or Chest Xray during the period you would be wearing the  monitor. The patch cannot be worn during these tests. You cannot remove and re-apply the  ZIO XT patch monitor.  Your ZIO patch monitor will be mailed 3 day USPS to your address on file. It may take 3-5 days  to receive your monitor after you have been enrolled.  Once you have received your monitor, please review the enclosed instructions. Your monitor  has already been registered assigning a specific monitor serial # to you.  Billing and Patient Assistance Program Information  We have supplied [employer] with any of your insurance information on file for billing purposes. [employer] offers a sliding scale Patient Assistance Program for patients that do not have  insurance, or whose insurance does not completely cover the cost of the ZIO monitor.  You must apply for the Patient Assistance Program to  qualify for this discounted rate.  To apply, please call [employer] at [phone], select option 4, select option 2, ask to apply for  Patient Assistance Program. Meredeth Ide will ask your household income, and how many people  are in your household. They will quote your out-of-pocket cost based on that information.  [employer] will also be able to set up a 55-month, interest-free payment plan if needed.  Applying the monitor   Shave hair from upper left chest.  Hold abrader disc by orange tab. Rub abrader in 40 strokes over the upper left chest as  indicated in your monitor instructions.  Clean area with 4 enclosed alcohol pads. Let dry.  Apply patch as indicated in monitor instructions. Patch will be placed under collarbone on  left  side of chest with arrow pointing upward.  Rub patch adhesive wings for 2 minutes. Remove white label marked "1". Remove the white  label marked "2". Rub patch adhesive wings for 2 additional minutes.  While looking in a mirror, press and release button in center of patch. A small green light will  flash 3-4 times. This will be your only indicator that the monitor has been turned on.  Do not shower for the first 24 hours. You may shower after the first 24 hours.  Press the button if you feel a symptom. You will hear a small click. Record Date, Time and  Symptom in the Patient Logbook.  When you are ready to remove the patch, follow instructions on the last 2 pages of Patient  Logbook. Stick patch monitor onto the last page of Patient Logbook.  Place Patient Logbook in the blue and white box. Use locking tab on box and tape box closed  securely. The blue and white box has prepaid postage on it. Please place it in the mailbox as  soon as possible. Your physician should have your test results approximately 7 days after the  monitor has been mailed back to [facility].  Call [facility] Customer Care at [phone] if you have questions regarding  your ZIO XT patch monitor. Call them immediately if you see an orange light blinking on your  monitor.  If your monitor falls off in less than 4 days, contact our Monitor department at [phone].  If your monitor becomes loose or falls off after 4 days call [employer] at [phone] for  suggestions on securing your monitor    Follow-Up: At [facility], you and your health needs are our priority.  As part of our continuing mission to provide you with exceptional heart care, we have created designated Provider Care Teams.  These Care Teams include your primary Cardiologist (physician) and Advanced Practice Providers (APPs -  Physician Assistants and Nurse Practitioners) who all work together to provide you with the care you need, when  you need it.  We recommend signing up for the patient portal called "MyChart".  Sign up information is provided on this After Visit Summary.  MyChart is used to connect with patients for Virtual Visits (Telemedicine).  Patients are able to view lab/test results, encounter notes, upcoming appointments, etc.  Non-urgent messages can be sent to your provider as well.   To learn more about what you can do with MyChart, go to [email or web address].    Your next appointment:   4 month(s)  The format for your next appointment:   In Person  Provider:   Thomasene Ripple, DO    Other Instructions    Dispo:  No follow-ups on file.  Medication Adjustments/Labs and Tests Ordered: Current medicines are reviewed at length with the patient today.  Concerns regarding medicines are outlined above.  Tests Ordered: Orders Placed This Encounter  Procedures   LONG TERM MONITOR (3-14 DAYS)   EKG 12-Lead   Medication Changes: No orders of the defined types were placed in this encounter.

## 2023-07-20 DIAGNOSIS — O10919 Unspecified pre-existing hypertension complicating pregnancy, unspecified trimester: Secondary | ICD-10-CM | POA: Insufficient documentation

## 2023-07-20 DIAGNOSIS — I10 Essential (primary) hypertension: Secondary | ICD-10-CM | POA: Insufficient documentation

## 2023-07-21 DIAGNOSIS — M25562 Pain in left knee: Secondary | ICD-10-CM | POA: Diagnosis not present

## 2023-07-23 DIAGNOSIS — M25562 Pain in left knee: Secondary | ICD-10-CM | POA: Diagnosis not present

## 2023-07-27 ENCOUNTER — Encounter: Payer: Self-pay | Admitting: *Deleted

## 2023-07-27 ENCOUNTER — Ambulatory Visit: Payer: Medicaid Other

## 2023-07-27 ENCOUNTER — Other Ambulatory Visit: Payer: Self-pay | Admitting: *Deleted

## 2023-07-27 ENCOUNTER — Ambulatory Visit: Payer: Medicaid Other | Attending: Obstetrics and Gynecology | Admitting: *Deleted

## 2023-07-27 VITALS — BP 136/71 | HR 98

## 2023-07-27 DIAGNOSIS — E669 Obesity, unspecified: Secondary | ICD-10-CM

## 2023-07-27 DIAGNOSIS — O99212 Obesity complicating pregnancy, second trimester: Secondary | ICD-10-CM

## 2023-07-27 DIAGNOSIS — O99210 Obesity complicating pregnancy, unspecified trimester: Secondary | ICD-10-CM | POA: Diagnosis not present

## 2023-07-27 DIAGNOSIS — O09299 Supervision of pregnancy with other poor reproductive or obstetric history, unspecified trimester: Secondary | ICD-10-CM

## 2023-07-27 DIAGNOSIS — Z8759 Personal history of other complications of pregnancy, childbirth and the puerperium: Secondary | ICD-10-CM | POA: Diagnosis not present

## 2023-07-27 DIAGNOSIS — Z98891 History of uterine scar from previous surgery: Secondary | ICD-10-CM

## 2023-07-27 DIAGNOSIS — Z8632 Personal history of gestational diabetes: Secondary | ICD-10-CM | POA: Insufficient documentation

## 2023-07-27 DIAGNOSIS — E119 Type 2 diabetes mellitus without complications: Secondary | ICD-10-CM | POA: Diagnosis not present

## 2023-07-27 DIAGNOSIS — D563 Thalassemia minor: Secondary | ICD-10-CM | POA: Diagnosis not present

## 2023-07-27 DIAGNOSIS — O0992 Supervision of high risk pregnancy, unspecified, second trimester: Secondary | ICD-10-CM | POA: Diagnosis not present

## 2023-07-27 DIAGNOSIS — O34219 Maternal care for unspecified type scar from previous cesarean delivery: Secondary | ICD-10-CM

## 2023-07-27 DIAGNOSIS — Z3A19 19 weeks gestation of pregnancy: Secondary | ICD-10-CM | POA: Diagnosis not present

## 2023-07-27 DIAGNOSIS — O10012 Pre-existing essential hypertension complicating pregnancy, second trimester: Secondary | ICD-10-CM

## 2023-07-27 DIAGNOSIS — O0990 Supervision of high risk pregnancy, unspecified, unspecified trimester: Secondary | ICD-10-CM

## 2023-07-27 DIAGNOSIS — Z903 Acquired absence of stomach [part of]: Secondary | ICD-10-CM | POA: Diagnosis not present

## 2023-07-27 DIAGNOSIS — O10919 Unspecified pre-existing hypertension complicating pregnancy, unspecified trimester: Secondary | ICD-10-CM

## 2023-07-27 DIAGNOSIS — I10 Essential (primary) hypertension: Secondary | ICD-10-CM | POA: Diagnosis not present

## 2023-07-27 DIAGNOSIS — O09292 Supervision of pregnancy with other poor reproductive or obstetric history, second trimester: Secondary | ICD-10-CM

## 2023-07-27 DIAGNOSIS — F439 Reaction to severe stress, unspecified: Secondary | ICD-10-CM | POA: Diagnosis not present

## 2023-07-27 DIAGNOSIS — E559 Vitamin D deficiency, unspecified: Secondary | ICD-10-CM | POA: Diagnosis not present

## 2023-07-27 DIAGNOSIS — O10912 Unspecified pre-existing hypertension complicating pregnancy, second trimester: Secondary | ICD-10-CM

## 2023-07-27 DIAGNOSIS — G4733 Obstructive sleep apnea (adult) (pediatric): Secondary | ICD-10-CM | POA: Diagnosis not present

## 2023-07-29 DIAGNOSIS — G4733 Obstructive sleep apnea (adult) (pediatric): Secondary | ICD-10-CM | POA: Diagnosis not present

## 2023-07-29 DIAGNOSIS — G894 Chronic pain syndrome: Secondary | ICD-10-CM | POA: Diagnosis not present

## 2023-07-29 DIAGNOSIS — R700 Elevated erythrocyte sedimentation rate: Secondary | ICD-10-CM | POA: Diagnosis not present

## 2023-07-29 DIAGNOSIS — Z903 Acquired absence of stomach [part of]: Secondary | ICD-10-CM | POA: Diagnosis not present

## 2023-07-29 DIAGNOSIS — K589 Irritable bowel syndrome without diarrhea: Secondary | ICD-10-CM | POA: Diagnosis not present

## 2023-07-29 DIAGNOSIS — K5090 Crohn's disease, unspecified, without complications: Secondary | ICD-10-CM | POA: Diagnosis not present

## 2023-07-29 DIAGNOSIS — R112 Nausea with vomiting, unspecified: Secondary | ICD-10-CM | POA: Diagnosis not present

## 2023-07-29 DIAGNOSIS — K219 Gastro-esophageal reflux disease without esophagitis: Secondary | ICD-10-CM | POA: Diagnosis not present

## 2023-07-29 DIAGNOSIS — K449 Diaphragmatic hernia without obstruction or gangrene: Secondary | ICD-10-CM | POA: Diagnosis not present

## 2023-07-29 DIAGNOSIS — E559 Vitamin D deficiency, unspecified: Secondary | ICD-10-CM | POA: Diagnosis not present

## 2023-07-29 DIAGNOSIS — R768 Other specified abnormal immunological findings in serum: Secondary | ICD-10-CM | POA: Diagnosis not present

## 2023-07-29 DIAGNOSIS — Z3A19 19 weeks gestation of pregnancy: Secondary | ICD-10-CM | POA: Diagnosis not present

## 2023-07-29 DIAGNOSIS — K5904 Chronic idiopathic constipation: Secondary | ICD-10-CM | POA: Diagnosis not present

## 2023-07-29 DIAGNOSIS — I10 Essential (primary) hypertension: Secondary | ICD-10-CM | POA: Diagnosis not present

## 2023-07-29 DIAGNOSIS — D563 Thalassemia minor: Secondary | ICD-10-CM | POA: Diagnosis not present

## 2023-07-29 DIAGNOSIS — K912 Postsurgical malabsorption, not elsewhere classified: Secondary | ICD-10-CM | POA: Diagnosis not present

## 2023-07-30 ENCOUNTER — Ambulatory Visit: Payer: Medicaid Other

## 2023-07-30 ENCOUNTER — Other Ambulatory Visit: Payer: Medicaid Other

## 2023-07-30 DIAGNOSIS — Z6841 Body Mass Index (BMI) 40.0 and over, adult: Secondary | ICD-10-CM | POA: Diagnosis not present

## 2023-08-03 ENCOUNTER — Inpatient Hospital Stay (HOSPITAL_COMMUNITY)
Admission: AD | Admit: 2023-08-03 | Discharge: 2023-08-03 | Disposition: A | Discharge status: Home / Self Care | Payer: Medicaid Other | Attending: Obstetrics & Gynecology | Admitting: Obstetrics & Gynecology

## 2023-08-03 ENCOUNTER — Telehealth: Payer: Self-pay

## 2023-08-03 ENCOUNTER — Other Ambulatory Visit: Payer: Self-pay

## 2023-08-03 ENCOUNTER — Encounter (HOSPITAL_COMMUNITY): Payer: Self-pay | Admitting: Obstetrics & Gynecology

## 2023-08-03 DIAGNOSIS — O09299 Supervision of pregnancy with other poor reproductive or obstetric history, unspecified trimester: Secondary | ICD-10-CM

## 2023-08-03 DIAGNOSIS — Z3A20 20 weeks gestation of pregnancy: Secondary | ICD-10-CM

## 2023-08-03 DIAGNOSIS — O99012 Anemia complicating pregnancy, second trimester: Secondary | ICD-10-CM

## 2023-08-03 DIAGNOSIS — Z98891 History of uterine scar from previous surgery: Secondary | ICD-10-CM

## 2023-08-03 DIAGNOSIS — O26892 Other specified pregnancy related conditions, second trimester: Secondary | ICD-10-CM | POA: Insufficient documentation

## 2023-08-03 DIAGNOSIS — O10919 Unspecified pre-existing hypertension complicating pregnancy, unspecified trimester: Secondary | ICD-10-CM

## 2023-08-03 DIAGNOSIS — O99891 Other specified diseases and conditions complicating pregnancy: Secondary | ICD-10-CM | POA: Diagnosis not present

## 2023-08-03 DIAGNOSIS — O0990 Supervision of high risk pregnancy, unspecified, unspecified trimester: Secondary | ICD-10-CM

## 2023-08-03 DIAGNOSIS — O99210 Obesity complicating pregnancy, unspecified trimester: Secondary | ICD-10-CM

## 2023-08-03 DIAGNOSIS — O10912 Unspecified pre-existing hypertension complicating pregnancy, second trimester: Secondary | ICD-10-CM

## 2023-08-03 LAB — PROTEIN / CREATININE RATIO, URINE
Creatinine, Urine: 103 mg/dL
Protein Creatinine Ratio: 0.07 mg/mg{creat} (ref 0.00–0.15)
Total Protein, Urine: 7 mg/dL

## 2023-08-03 LAB — CBC
HCT: 32.1 % — ABNORMAL LOW (ref 36.0–46.0)
Hemoglobin: 10.4 g/dL — ABNORMAL LOW (ref 12.0–15.0)
MCH: 25.1 pg — ABNORMAL LOW (ref 26.0–34.0)
MCHC: 32.4 g/dL (ref 30.0–36.0)
MCV: 77.3 fL — ABNORMAL LOW (ref 80.0–100.0)
Platelets: 184 10*3/uL (ref 150–400)
RBC: 4.15 MIL/uL (ref 3.87–5.11)
RDW: 13.6 % (ref 11.5–15.5)
WBC: 6 10*3/uL (ref 4.0–10.5)
nRBC: 0 % (ref 0.0–0.2)

## 2023-08-03 LAB — COMPREHENSIVE METABOLIC PANEL
ALT: 13 U/L (ref 0–44)
AST: 13 U/L — ABNORMAL LOW (ref 15–41)
Albumin: 2.7 g/dL — ABNORMAL LOW (ref 3.5–5.0)
Alkaline Phosphatase: 57 U/L (ref 38–126)
Anion gap: 7 (ref 5–15)
BUN: 5 mg/dL — ABNORMAL LOW (ref 6–20)
CO2: 24 mmol/L (ref 22–32)
Calcium: 8.6 mg/dL — ABNORMAL LOW (ref 8.9–10.3)
Chloride: 105 mmol/L (ref 98–111)
Creatinine, Ser: 0.36 mg/dL — ABNORMAL LOW (ref 0.44–1.00)
GFR, Estimated: >60 mL/min (ref >60)
Glucose, Bld: 83 mg/dL (ref 70–99)
Potassium: 4.2 mmol/L (ref 3.5–5.1)
Sodium: 136 mmol/L (ref 135–145)
Total Bilirubin: 0.4 mg/dL (ref 0.3–1.2)
Total Protein: 6 g/dL — ABNORMAL LOW (ref 6.5–8.1)

## 2023-08-03 MED ORDER — FERROUS SULFATE 325 (65 FE) MG PO TBEC: 325.0000 mg | DELAYED_RELEASE_TABLET | ORAL | 1 refills | Status: DC

## 2023-08-03 NOTE — Telephone Encounter (Signed)
[place], Babyscripts  P [facility] Htn Md; P [facility] Clinical Pool Babyscripts Trigger Notification for [place], Vernadette Trigger Severity: Elevated Blood Pressure Value: 146/78 Reading Date: 2023-08-03 Symptoms: Persistent Headache, Shortness Of Breath   Per chart review, history of chronic hypertension but not currently on meds. BPs have been normal at prenatal appts. Called pt who reports she took BP around documented time of 1247 because she was having a headache, racing heartbeat, and shortness of breath. BP initially 146/78. She then laid down and took a nap. When she woke up this afternoon her headache was resolved and trouble breathing much better but still present. Still feels sluggish, chest aching, and racing heartbeat. BP now is 145/74, HR 87. She also mentions she tried to eat a bowl of soup and vomited. Reviewed with patient that because of chest pain, trouble breathing, and elevated BP she should be seen at the [facility] for eval. She plans to be there within the next 30 minutes. [facility] providers notified by secure chat.

## 2023-08-03 NOTE — MAU Provider Note (Signed)
Chief Complaint: Headache, BP Evaluation, Chest Pain, and Shortness of Breath   Event Date/Time   First Provider Initiated Contact with Patient 08/03/23 1738      SUBJECTIVE HPI: Maureen Lewis is a 27 y.o. [id number] at [redacted]w[redacted]d by early ultrasound who presents to maternity admissions reporting CP, SOB, elevated BP from home readings.  Felt fatigued today. When lying down for a nap, had an episode of palpitations with SOB. Checked BP and was elevated. Took another reading after a nap and continued to be elevated. Received a call from baby scripts nurse to come in for evaluation given elevated BP and symptoms. Currently denies HA, vision changes, RUQ pain, SOB, CP. Feels much improved now vs earlier this afternoon. Is seeing OB cardiology for palpitations and has ziopatch ordered.  Has hx of cHTN. Not on any medications. +FM. No LOF, VB, change in vaginal discharge.  HPI  Past Medical History:  Diagnosis Date   Abnormal uterine bleeding    Acid reflux    Alpha thalassemia trait    Amenorrhea    Anemia    no current med.   Asthma    Back pain    Chest pain    resolved   Complication of anesthesia    states had to keep giving her anesthesia during EGD   Constipation    Depression    "I'm good"   Fibromyalgia    Finger mass, left 03/2017   middle finger   Gestational hypertension 09/23/2019   Guidelines for Antenatal Testing and Sonography  (with updated ICD-10 codes)  Updated  09-16-2019 with Dr. Noralee Space  INDICATION U/S 2 X week NST/AFI  or full BPP wkly DELIVERY Diabetes   A1 - good control - O24.410    A2 - good control - O24.419      A2  - poor control or poor compliance - O24.419, E11.65   (Macrosomia or polyhydramnios) **E11.65 is extra code for poor control**    A2/B - O24.   Headache    Hepatitis 07/18/2022   Hepatitis B   History of kidney stones    Hypertension    Irritable bowel syndrome (IBS)    Joint pain    Morbid obesity with body mass index (BMI) of  45.0 to 49.9 in adult [facility])    Plantar fasciitis, bilateral    resolved   Polycystic ovary syndrome    Pre-diabetes    no meds, diet controlled, diet not check blood sugar   Shortness of breath    albuterol inhaler   Sleep apnea    no CPAP use   Vertigo 2017   Past Surgical History:  Procedure Laterality Date   CESAREAN SECTION N/A 10/09/2019   Procedure: CESAREAN SECTION;  Surgeon: Hewlett Bing, MD;  Location: [facility];  Service: Obstetrics;  Laterality: N/A;   COLONOSCOPY WITH PROPOFOL  10/07/2016   DILATION AND CURETTAGE OF UTERUS     ESOPHAGOGASTRODUODENOSCOPY  12/21/2015   EXCISION MASS UPPER EXTREMETIES Left 04/21/2017   Procedure: EXCISION MASS LEFT MIDDLE FINGER;  Surgeon: Cindee Salt, MD;  Location: [facility];  Service: Orthopedics;  Laterality: Left;   gastric sleeve  05/09/2022   IR FL GUIDED LOC OF NEEDLE/CATH TIP FOR SPINAL INJECTION RT  03/13/2020   KNEE ARTHROSCOPY WITH LATERAL MENISECTOMY Left 10/31/2022   Procedure: KNEE ARTHROSCOPY WITH PARTIAL LATERAL MENISECTOMY;  Surgeon: Yolonda Kida, MD;  Location: [facility] ORS;  Service: Orthopedics;  Laterality: Left;  77   KNEE  ARTHROSCOPY WITH LATERAL RELEASE Left 10/31/2022   Procedure: KNEE ARTHROSCOPY WITH LATERAL RELEASE AND LIMITED SYNOVECTOMY;  Surgeon: Yolonda Kida, MD;  Location: [facility] ORS;  Service: Orthopedics;  Laterality: Left;  75   PHOTOCOAGULATION WITH LASER Left 12/13/2020   Procedure: RETINOPLEXY LEFT EYE;  Surgeon: Stephannie Li, MD;  Location: [facility] OR;  Service: Ophthalmology;  Laterality: Left;   SCLERAL BUCKLE Right 12/13/2020   Procedure: SCLERAL BUCKLE WITH CRYO;  Surgeon: Stephannie Li, MD;  Location: [facility] OR;  Service: Ophthalmology;  Laterality: Right;   Social History   Socioeconomic History   Marital status: Single    Spouse name: Not on file   Number of children: Not on file   Years of education: Not on file   Highest education level: Not on file  Occupational  History   Occupation: IT sales professional    Employer: [id number]  Tobacco Use   Smoking status: Never   Smokeless tobacco: Never  Vaping Use   Vaping status: Never Used  Substance and Sexual Activity   Alcohol use: Not Currently    Comment: social    Drug use: No   Sexual activity: Not Currently    Birth control/protection: None  Other Topics Concern   Not on file  Social History Narrative   Right handed    Soda sometimes   Lives with mom and cousin, a grandmother in [place] also helps care for her.       She is in nursing school.    Social Determinants of Health   Financial Resource Strain: High Risk (11/05/2022)   Received from [facility], [facility]   Overall Financial Resource Strain (CARDIA)    Difficulty of Paying Living Expenses: Hard  Food Insecurity: No Food Insecurity (05/07/2023)   Hunger Vital Sign    Worried About Running Out of Food in the Last Year: Never true    Ran Out of Food in the Last Year: Never true  Transportation Needs: No Transportation Needs (05/07/2023)   PRAPARE - Administrator, Civil Service (Medical): No    Lack of Transportation (Non-Medical): No  Physical Activity: Inactive (11/05/2022)   Received from [facility], [facility]   Exercise Vital Sign    Days of Exercise per Week: 0 days    Minutes of Exercise per Session: 40 min  Stress: Stress Concern Present (11/05/2022)   Received from [facility], [facility] - Occupational Stress Questionnaire    Feeling of Stress : Rather much  Social Connections: Somewhat Isolated (11/05/2022)   Received from [facility], [facility]   Social Network    How would you rate your social network (family, work, friends)?: Restricted participation with some degree of social isolation  Intimate Partner Violence: Not At Risk (11/05/2022)   Received from [facility], [facility]   HITS    Over the last 12 months how often did your  partner physically hurt you?: 1    Over the last 12 months how often did your partner insult you or talk down to you?: 1    Over the last 12 months how often did your partner threaten you with physical harm?: 1    Over the last 12 months how often did your partner scream or curse at you?: 1   No current facility-administered medications on file prior to encounter.   Current Outpatient Medications on File Prior to Encounter  Medication Sig Dispense Refill   albuterol (VENTOLIN HFA)  108 (90 Base) MCG/ACT inhaler Inhale 1 puff into the lungs every 4 (four) hours as needed for shortness of breath.     aspirin EC 81 MG tablet Take 2 tablets (162 mg total) by mouth daily. Swallow whole. 180 tablet 12   metoCLOPramide (REGLAN) 10 MG tablet Take 1 tablet (10 mg total) by mouth every 6 (six) hours. 30 tablet 0   ondansetron (ZOFRAN-ODT) 4 MG disintegrating tablet Take 1 tablet (4 mg total) by mouth every 8 (eight) hours as needed for nausea or vomiting. 30 tablet 0   Prenatal Vit-Fe Phos-FA-Omega (VITAFOL GUMMIES) 3.33-0.333-34.8 MG CHEW Chew 1 tablet by mouth daily. 90 tablet 3   scopolamine (TRANSDERM-SCOP) 1 MG/3DAYS Place 1 patch (1.5 mg total) onto the skin every 3 (three) days. 10 patch 6   Blood Pressure Monitoring (BLOOD PRESSURE KIT) DEVI 1 Device by Does not apply route as needed. 1 each 0   butalbital-acetaminophen-caffeine (FIORICET) 50-325-40 MG tablet Take 1-2 tablets by mouth every 6 (six) hours as needed for headache. (Patient not taking: Reported on 07/17/2023) 40 tablet 1   cyclobenzaprine (FLEXERIL) 10 MG tablet Take 1 tablet (10 mg total) by mouth 3 (three) times daily as needed for muscle spasms. 40 tablet 2   famotidine (PEPCID) 20 MG tablet Take 1 tablet (20 mg total) by mouth 2 (two) times daily. 30 tablet 0   fluticasone (FLONASE) 50 MCG/ACT nasal spray Place 2 sprays into both nostrils daily. 16 g 6   loratadine (CLARITIN) 10 MG tablet Take 10 mg by mouth daily as needed for  allergies.     promethazine (PHENERGAN) 25 MG suppository Place 1 suppository (25 mg total) rectally every 6 (six) hours as needed for nausea or vomiting. (Patient not taking: Reported on 07/17/2023) 12 each 0   triamcinolone (KENALOG) 0.025 % ointment Apply 1 Application topically 2 (two) times daily as needed (itching). 30 g 0   Allergies  Allergen Reactions   Bee Venom Hives, Shortness Of Breath and Swelling   Hydrocodone-Acetaminophen Anaphylaxis    No problem when she takes Tylenol   Peach Flavor Hives, Shortness Of Breath and Other (See Comments)    SWELLING OF MOUTH   Pollen Extract Hives, Shortness Of Breath and Swelling   Remdesivir Swelling    Angioedema 8/25   Latex Rash   Cortisone Itching and Swelling   Cortizone-10 [Hydrocortisone]     Swelling and itching   Toradol [Ketorolac Tromethamine] Swelling    ROS:  Pertinent positives/negatives listed above.  I have reviewed patient's Past Medical Hx, Surgical Hx, Family Hx, Social Hx, medications and allergies.   Physical Exam  Patient Vitals for the past 24 hrs:  BP Temp Temp src Pulse Resp SpO2 Height Weight  08/03/23 1707 120/77 98.6 F (37 C) Oral 94 18 98 % -- --  08/03/23 1657 -- -- -- -- -- -- 6' 2.5" (1.892 m) (!) 174.8 kg   Constitutional: Well-developed, well-nourished female in no acute distress.  Cardiovascular: normal rate, rhythm. No m/r/g Respiratory: normal effort, CTAB GI: Abd soft, non-tender. Pos BS x 4 MS: Extremities nontender, trace edema, normal ROM Neurologic: Alert and oriented x 4.   Doppler: 152  LAB RESULTS Results for orders placed or performed during the hospital encounter of 08/03/23 (from the past 24 hour(s))  Protein / creatinine ratio, urine     Status: None   Collection Time: 08/03/23  6:02 PM  Result Value Ref Range   Creatinine, Urine 103 mg/dL   Total Protein,  Urine 7 mg/dL   Protein Creatinine Ratio 0.07 0.00 - 0.15 mg/mg[Cre]  CBC     Status: Abnormal   Collection Time:  08/03/23  6:11 PM  Result Value Ref Range   WBC 6.0 4.0 - 10.5 K/uL   RBC 4.15 3.87 - 5.11 MIL/uL   Hemoglobin 10.4 (L) 12.0 - 15.0 g/dL   HCT 16.1 (L) 09.6 - 04.5 %   MCV 77.3 (L) 80.0 - 100.0 fL   MCH 25.1 (L) 26.0 - 34.0 pg   MCHC 32.4 30.0 - 36.0 g/dL   RDW 40.9 81.1 - 91.4 %   Platelets 184 150 - 400 K/uL   nRBC 0.0 0.0 - 0.2 %  Comprehensive metabolic panel     Status: Abnormal   Collection Time: 08/03/23  6:11 PM  Result Value Ref Range   Sodium 136 135 - 145 mmol/L   Potassium 4.2 3.5 - 5.1 mmol/L   Chloride 105 98 - 111 mmol/L   CO2 24 22 - 32 mmol/L   Glucose, Bld 83 70 - 99 mg/dL   BUN 5 (L) 6 - 20 mg/dL   Creatinine, Ser 7.82 (L) 0.44 - 1.00 mg/dL   Calcium 8.6 (L) 8.9 - 10.3 mg/dL   Total Protein 6.0 (L) 6.5 - 8.1 g/dL   Albumin 2.7 (L) 3.5 - 5.0 g/dL   AST 13 (L) 15 - 41 U/L   ALT 13 0 - 44 U/L   Alkaline Phosphatase 57 38 - 126 U/L   Total Bilirubin 0.4 0.3 - 1.2 mg/dL   GFR, Estimated >95 >62 mL/min   Anion gap 7 5 - 15   *Note: Due to a large number of results and/or encounters for the requested time period, some results have not been displayed. A complete set of results can be found in Results Review.    A/Positive/-- (07/18 1228)  EKG: HR 83. Normal rhythm, axis, intervals. No ST changes.  IMAGING [name] MFM OB DETAIL +14 WK  Result Date: 07/27/2023 ----------------------------------------------------------------------  OBSTETRICS REPORT                       (Signed Final 07/27/2023 09:48 am) ---------------------------------------------------------------------- Patient Info  ID #:       [id number]                          D.O.B.:  Oct 08, 1996 (27 yrs)  Name:       ARYIANA DEGLER RENEE              Visit Date: 07/27/2023 08:01 am              [place] ---------------------------------------------------------------------- Performed By  Attending:        Ma Rings MD         Ref. Address:     [address]                                                              [place], [place]  [address]  Performed By:     Alain Marion     [facility]  Referred By:      [facility] ---------------------------------------------------------------------- Orders  #  Description                           Code        Ordered By  1  [name] MFM OB DETAIL +14 WK               76811.01    Milas Hock ----------------------------------------------------------------------  #  Order #                     Accession #                Episode #  1  [id number]                   [id number]                 [id number] ---------------------------------------------------------------------- Indications  Obesity complicating pregnancy, second         O99.212  trimester (PBMI 48)  Hypertension - Chronic/Pre-existing            O10.019  Poor obstetric history: Previous gestational   O09.299  diabetes  Genetic carrier (Alpha thal silent carrier)    Z14.8  Previous cesarean delivery, antepartum         O34.219  LR NIP, positive horizon  Encounter for antenatal screening for          Z36.3  malformations  [redacted] weeks gestation of pregnancy                Z3A.19 ---------------------------------------------------------------------- Fetal Evaluation  Num Of Fetuses:         1  Fetal Heart Rate(bpm):  128  Cardiac Activity:       Observed  Presentation:           Breech  Placenta:               Posterior  P. Cord Insertion:      Visualized  Largest Pocket(cm)                              5.43 ---------------------------------------------------------------------- Biometry  BPD:      47.3  mm     G. Age:  20w 2d         90  %    CI:        76.45   %    70 - 86                                                           FL/HC:      18.6   %    16.1 - 18.3  HC:      171.4  mm     G. Age:  19w 5d         71  %    HC/AC:      1.15        1.09 - 1.39  AC:      148.7  mm     G. Age:  20w 1d         77  %    FL/BPD:     67.2   %  FL:       31.8  mm     G. Age:  19w 6d         71  %    FL/AC:      21.4   %    20 - 24  HUM:      31.8  mm     G. Age:  20w 4d         90  %  LV:        5.9  mm  Est. FW:     326  gm    0 lb 11 oz      90  % ---------------------------------------------------------------------- OB History  Gravidity:    4         Term:   1         SAB:   2  Living:       1 ---------------------------------------------------------------------- Gestational Age  LMP:           20w 2d        Date:  03/07/23                  EDD:   12/12/23  U/S Today:     20w 0d                                        EDD:   12/14/23  Best:          19w 1d     Det. By:  U/S C R L  (04/27/23)    EDD:   12/20/23 ---------------------------------------------------------------------- Targeted Anatomy  Central Nervous System  Calvarium/Cranial V.:  Appears normal         Cereb./Vermis:          Not well visualized  Cavum:                 Appears normal  Cisterna Magna:         Not well visualized  Lateral Ventricles:    Appears normal         Midline Falx:           Appears normal  Choroid Plexus:        Appears normal  Spine  Cervical:              Appears normal         Sacral:                 Appears normal  Thoracic:              Appears normal         Shape/Curvature:        Appears normal  Lumbar:                Appears normal  Head/Neck  Lips:                  Appears normal         Profile:                Appears normal  Neck:                  Appears normal         Orbits/Eyes:            Appears normal  Nuchal Fold:           Appears normal         Mandible:               Appears normal  Nasal Bone:            Present                Maxilla:                Appears normal  Thorax  4 Chamber View:        Not  well visualized    Interventr. Septum:     Appears normal  Cardiac Rhythm:        Normal                 Cardiac Axis:           Not well visualized  Cardiac Situs:         Appears normal         Diaphragm:              Appears normal  Rt Outflow Tract:      Not well visualized    3 Vessel View:          Appears normal  Lt Outflow Tract:      Not well visualized    3 V Trachea View:       Appears normal  Aortic Arch:           Not well visualized    IVC:                    Appears normal  Ductal Arch:           Appears normal         Crossing:               Not well visualized  SVC:  Appears normal  Abdomen  Ventral Wall:          Appears normal         Lt Kidney:              Not well visualized  Cord Insertion:        Appears normal         Rt Kidney:              Not well visualized  Situs:                 Appears normal         Bladder:                Appears normal  Stomach:               Appears normal  Extremities  Lt Humerus:            Appears normal         Lt Femur:               Appears normal  Rt Humerus:            Appears normal         Rt Femur:               Appears normal  Lt Forearm:            Appears normal         Lt Lower Leg:           Appears normal  Rt Forearm:            Appears normal         Rt Lower Leg:           Appears normal  Lt Hand:               Not well visualized    Lt Foot:                Not well visualized  Rt Hand:               Not well visualized    Rt Foot:                Not well visualized  Other  Umbilical Cord:        Normal 3-vessel        Genitalia:              Female-nml  Comment:     Technically difficult due to maternal habitus. ---------------------------------------------------------------------- Cervix Uterus Adnexa  Cervix  Length:            4.8  cm.  Normal appearance by transabdominal scan  Uterus  No abnormality visualized.  Right Ovary  Not visualized.  Left Ovary  Not visualized.  Cul De Sac  No free fluid seen.  Adnexa  No abnormality  visualized ---------------------------------------------------------------------- Comments  This patient was seen for a detailed fetal anatomy scan due  to maternal obesity with a BMI of 48 and chronic hypertension  that is not treated with any medications.  Her blood pressure  today was 136/71.  She denies any problems in her current pregnancy.  She had a cell free DNA test earlier in her pregnancy which  indicated a low risk for trisomy 60, 15, and 13. A female fetus is  predicted.  She was informed that the fetal growth and amniotic fluid  level  were appropriate for her gestational age.  The views of the fetal anatomy were limited today due to  maternal body habitus and the fetal position.  The views of  the posterior fossa, fetal heart, and extremities could not be  fully visualized today.  The patient was informed that anomalies may be missed due  to technical limitations. If the fetus is in a suboptimal position  or maternal habitus is increased, visualization of the fetus in  the maternal uterus may be impaired.  Due to maternal obesity, we will continue to follow her with  growth ultrasounds throughout her pregnancy.  Weekly fetal testing will be started at around 34 weeks.  She was advised to continue taking a daily baby aspirin for  preeclampsia prophylaxis.  A follow-up exam was scheduled in 4 weeks to complete the  views of the fetal anatomy and to assess the fetal growth. ----------------------------------------------------------------------                   Ma Rings, MD Electronically Signed Final Report   07/27/2023 09:48 am ----------------------------------------------------------------------    MAU Management/MDM: Orders Placed This Encounter  Procedures   CBC   Comprehensive metabolic panel   Protein / creatinine ratio, urine   EKG 12-Lead   Discharge patient    Meds ordered this encounter  Medications   ferrous sulfate 325 (65 FE) MG EC tablet    Sig: Take 1 tablet (325 mg total)  by mouth every other day.    Dispense:  45 tablet    Refill:  1    Patient with known cHTN and hx of symptomatic palpitations, currently being worked up by [facility] cardiology. Given several mild range pressures at home, will obtain preE r/o. Additionally is to bring home BP cuff into office at next visit. Given resolution of symptoms currently, do not suspect ACS, PE, underlying pulmonary concern.  1936: Patient feels well on reassessment. No pain at this time. Bps remained wnl. Discussed negative preE labs and that she now has a baseline if concerns arise later on. Did encouraged PO iron for anemia in setting of pregnancy and palpitations. Unsure if she'll tolerate this very well given her history of gastric sleeve. Would have a low threshold for IV iron for her. Encouraged her to talk to her primary OB provider once she starts taking the iron supplement.  ASSESSMENT 1. Chronic hypertension during pregnancy, antepartum   2. Anemia affecting pregnancy in second trimester   3. [redacted] weeks gestation of pregnancy     PLAN Discharge home with strict return precautions. Allergies as of 08/03/2023       Reactions   Bee Venom Hives, Shortness Of Breath, Swelling   Hydrocodone-acetaminophen Anaphylaxis   No problem when she takes Tylenol   Peach Flavor Hives, Shortness Of Breath, Other (See Comments)   SWELLING OF MOUTH   Pollen Extract Hives, Shortness Of Breath, Swelling   Remdesivir Swelling   Angioedema 8/25   Latex Rash   Cortisone Itching, Swelling   Cortizone-10 [hydrocortisone]    Swelling and itching   Toradol [ketorolac Tromethamine] Swelling        Medication List     STOP taking these medications    butalbital-acetaminophen-caffeine 50-325-40 MG tablet Commonly known as: FIORICET   promethazine 25 MG suppository Commonly known as: PHENERGAN       TAKE these medications    albuterol 108 (90 Base) MCG/ACT inhaler Commonly known as: VENTOLIN HFA Inhale 1 puff into the  lungs every  4 (four) hours as needed for shortness of breath.   aspirin EC 81 MG tablet Take 2 tablets (162 mg total) by mouth daily. Swallow whole.   Blood Pressure Kit Devi 1 Device by Does not apply route as needed.   cyclobenzaprine 10 MG tablet Commonly known as: FLEXERIL Take 1 tablet (10 mg total) by mouth 3 (three) times daily as needed for muscle spasms.   famotidine 20 MG tablet Commonly known as: PEPCID Take 1 tablet (20 mg total) by mouth 2 (two) times daily.   ferrous sulfate 325 (65 FE) MG EC tablet Take 1 tablet (325 mg total) by mouth every other day.   fluticasone 50 MCG/ACT nasal spray Commonly known as: FLONASE Place 2 sprays into both nostrils daily.   loratadine 10 MG tablet Commonly known as: CLARITIN Take 10 mg by mouth daily as needed for allergies.   metoCLOPramide 10 MG tablet Commonly known as: REGLAN Take 1 tablet (10 mg total) by mouth every 6 (six) hours.   ondansetron 4 MG disintegrating tablet Commonly known as: ZOFRAN-ODT Take 1 tablet (4 mg total) by mouth every 8 (eight) hours as needed for nausea or vomiting.   Transderm-Scop 1 MG/3DAYS Generic drug: scopolamine Place 1 patch (1.5 mg total) onto the skin every 3 (three) days.   triamcinolone 0.025 % ointment Commonly known as: KENALOG Apply 1 Application topically 2 (two) times daily as needed (itching).   Vitafol Gummies 3.33-0.333-34.8 MG Chew Chew 1 tablet by mouth daily.         Wylene Simmer, MD OB Fellow 08/03/2023  7:36 PM

## 2023-08-03 NOTE — MAU Note (Signed)
Maureen Lewis is a 27 y.o. at [redacted]w[redacted]d here in MAU reporting: was instructed to be seen in MAU secondary elevated BP and HA.  States takes BP's @ home and had 3 elevated BP's today.  Denies visual disturbances, but reports SOB, HA, and chest pain.  States chest pain is constant, sharp and aching and is located mid sternum and radiates around under left breast.  States "feels like heart is about to beat out my chest."  Also reports SOB is causing difficulty breathing and hard to catch breath.   Denies VB or LOF.  Endorses +FM, but less than usual.  LMP: NA Onset of complaint: today Pain score: 9 HA & chest Vitals:   08/03/23 1707  BP: 120/77  Pulse: 94  Resp: 18  Temp: 98.6 F (37 C)  SpO2: 98%     [id number] Lab orders placed from triage:   UA

## 2023-08-10 ENCOUNTER — Telehealth: Payer: Self-pay

## 2023-08-10 ENCOUNTER — Ambulatory Visit (INDEPENDENT_AMBULATORY_CARE_PROVIDER_SITE_OTHER): Payer: Medicaid Other | Admitting: Family Medicine

## 2023-08-10 ENCOUNTER — Other Ambulatory Visit: Payer: Self-pay

## 2023-08-10 VITALS — BP 106/69 | HR 89 | Wt 383.1 lb

## 2023-08-10 DIAGNOSIS — O0990 Supervision of high risk pregnancy, unspecified, unspecified trimester: Secondary | ICD-10-CM

## 2023-08-10 DIAGNOSIS — O0992 Supervision of high risk pregnancy, unspecified, second trimester: Secondary | ICD-10-CM

## 2023-08-10 DIAGNOSIS — O09292 Supervision of pregnancy with other poor reproductive or obstetric history, second trimester: Secondary | ICD-10-CM

## 2023-08-10 DIAGNOSIS — O09299 Supervision of pregnancy with other poor reproductive or obstetric history, unspecified trimester: Secondary | ICD-10-CM

## 2023-08-10 DIAGNOSIS — D509 Iron deficiency anemia, unspecified: Secondary | ICD-10-CM | POA: Insufficient documentation

## 2023-08-10 DIAGNOSIS — O10912 Unspecified pre-existing hypertension complicating pregnancy, second trimester: Secondary | ICD-10-CM

## 2023-08-10 DIAGNOSIS — Z98891 History of uterine scar from previous surgery: Secondary | ICD-10-CM

## 2023-08-10 DIAGNOSIS — O10919 Unspecified pre-existing hypertension complicating pregnancy, unspecified trimester: Secondary | ICD-10-CM

## 2023-08-10 DIAGNOSIS — D508 Other iron deficiency anemias: Secondary | ICD-10-CM

## 2023-08-10 DIAGNOSIS — Z8632 Personal history of gestational diabetes: Secondary | ICD-10-CM

## 2023-08-10 DIAGNOSIS — Z3A21 21 weeks gestation of pregnancy: Secondary | ICD-10-CM

## 2023-08-10 NOTE — Progress Notes (Signed)
   PRENATAL VISIT NOTE  Subjective:  Maureen Lewis is a 27 y.o. [id number] at [redacted]w[redacted]d being seen today for ongoing prenatal care.  She is currently monitored for the following issues for this high-risk pregnancy and has OSA (obstructive sleep apnea); PCOS (polycystic ovarian syndrome); Vitamin D deficiency; Prediabetes; Alpha thalassemia trait; Supervision of high risk pregnancy, antepartum; History of gestational diabetes mellitus (GDM) in prior pregnancy, currently pregnant; Obesity in pregnancy; History of C-section; S/P laparoscopic sleeve gastrectomy; Idiopathic intracranial hypertension; Abnormal Pap smear of cervix; Headache in pregnancy, antepartum, first trimester; Chronic hypertension during pregnancy, antepartum; and Iron deficiency anemia on their problem list.  Patient reports no complaints.  Contractions: Not present. Vag. Bleeding: None.  Movement: Present. Denies leaking of fluid.   The following portions of the patient's history were reviewed and updated as appropriate: allergies, current medications, past family history, past medical history, past social history, past surgical history and problem list.   Objective:   Vitals:   08/10/23 0912  BP: 106/69  Pulse: 89  Weight: (!) 383 lb 1.6 oz (173.8 kg)    Fetal Status: Fetal Heart Rate (bpm): 154   Movement: Present     General:  Alert, oriented and cooperative. Patient is in no acute distress.  Skin: Skin is warm and dry. No rash noted.   Cardiovascular: Normal heart rate noted  Respiratory: Normal respiratory effort, no problems with respiration noted  Abdomen: Soft, gravid, appropriate for gestational age.  Pain/Pressure: Absent     Pelvic: Cervical exam deferred        Extremities: Normal range of motion.  Edema: None  Mental Status: Normal mood and affect. Normal behavior. Normal judgment and thought content.   Assessment and Plan:  Pregnancy: G4P1021 at [redacted]w[redacted]d 1. Chronic hypertension during pregnancy,  antepartum On no meds. BP is ok today On ASA  2. History of C-section Desires TOLAC if able  3. History of gestational diabetes mellitus (GDM) in prior pregnancy, currently pregnant Normal early 1 hour  4. Supervision of high risk pregnancy, antepartum Continue prenatal care.  5. [redacted] weeks gestation of pregnancy   6. Iron deficiency anemia secondary to inadequate dietary iron intake Unable to tolerate PO secondary to Gastric sleeve--will schedule for IV repletion  Preterm labor symptoms and general obstetric precautions including but not limited to vaginal bleeding, contractions, leaking of fluid and fetal movement were reviewed in detail with the patient. Please refer to After Visit Summary for other counseling recommendations.   Return in 4 weeks (on 09/07/2023).  Future Appointments  Date Time Provider Department Center  08/24/2023  7:30 AM [facility] US5 [facility]  08/24/2023  9:00 AM CENTERING PROVIDER [facility]  09/21/2023  9:00 AM CENTERING PROVIDER [facility]  10/19/2023  9:00 AM CENTERING PROVIDER [facility]  11/02/2023  9:00 AM CENTERING PROVIDER [facility]  11/03/2023  8:20 AM Tobb, Kardie, DO [facility] None  11/16/2023  9:00 AM CENTERING PROVIDER [facility]  11/30/2023  9:00 AM CENTERING PROVIDER [facility]  12/14/2023  9:00 AM CENTERING PROVIDER [facility]  12/28/2023  9:00 AM CENTERING PROVIDER [facility]    Reva Bores, MD

## 2023-08-10 NOTE — Telephone Encounter (Signed)
Dr. Shawnie Pons, patient will be scheduled as soon as possible.  Auth Submission: NO AUTH NEEDED Site of care: Site of care: [facility] Payer: [employer] Medication & CPT/J Code(s) submitted: Venofer (Iron Sucrose) J1756 Route of submission (phone, fax, portal):  Phone # Fax # Auth type: Buy/Bill PB Units/visits requested: 200mg  x 5 doses Reference number:  Approval from: 08/10/23 to 10/20/23

## 2023-08-11 DIAGNOSIS — Z3A22 22 weeks gestation of pregnancy: Secondary | ICD-10-CM | POA: Diagnosis not present

## 2023-08-11 DIAGNOSIS — Z903 Acquired absence of stomach [part of]: Secondary | ICD-10-CM | POA: Diagnosis not present

## 2023-08-11 DIAGNOSIS — O210 Mild hyperemesis gravidarum: Secondary | ICD-10-CM | POA: Diagnosis not present

## 2023-08-12 ENCOUNTER — Ambulatory Visit (INDEPENDENT_AMBULATORY_CARE_PROVIDER_SITE_OTHER): Payer: Medicaid Other

## 2023-08-12 VITALS — BP 121/77 | HR 86 | Temp 99.1°F | Resp 18 | Ht 74.5 in | Wt 382.6 lb

## 2023-08-12 DIAGNOSIS — D508 Other iron deficiency anemias: Secondary | ICD-10-CM

## 2023-08-12 DIAGNOSIS — Z3A21 21 weeks gestation of pregnancy: Secondary | ICD-10-CM | POA: Diagnosis not present

## 2023-08-12 DIAGNOSIS — O99012 Anemia complicating pregnancy, second trimester: Secondary | ICD-10-CM | POA: Diagnosis not present

## 2023-08-12 DIAGNOSIS — Z9884 Bariatric surgery status: Secondary | ICD-10-CM

## 2023-08-12 MED ORDER — IRON SUCROSE 20 MG/ML IV SOLN
200.0000 mg | Freq: Once | INTRAVENOUS | Status: AC
  Administered 2023-08-12: 200 mg via INTRAVENOUS
  Filled 2023-08-12: qty 10

## 2023-08-12 MED ORDER — ACETAMINOPHEN 325 MG PO TABS
650.0000 mg | ORAL_TABLET | Freq: Once | ORAL | Status: AC
  Administered 2023-08-12: 650 mg via ORAL
  Filled 2023-08-12: qty 2

## 2023-08-12 MED ORDER — SODIUM CHLORIDE 0.9% FLUSH: 10.0000 mL | Freq: Once | INTRAVENOUS | Status: DC

## 2023-08-12 MED ORDER — DIPHENHYDRAMINE HCL 25 MG PO CAPS
25.0000 mg | ORAL_CAPSULE | Freq: Once | ORAL | Status: AC
  Administered 2023-08-12: 25 mg via ORAL
  Filled 2023-08-12: qty 1

## 2023-08-12 NOTE — Progress Notes (Signed)
Diagnosis: Iron Deficiency Anemia  Provider:  Chilton Greathouse MD  Procedure: IV Infusion  IV Type: Peripheral, IV Location: R Hand  Venofer (Iron Sucrose), Dose: 200 mg  Infusion Start Time: 1607  Infusion Stop Time: 1612  Post Infusion IV Care: Observation period completed and Peripheral IV Discontinued  Discharge: Condition: Good, Destination: Home . AVS Declined  Performed by:  Rico Ala, LPN

## 2023-08-14 ENCOUNTER — Ambulatory Visit (INDEPENDENT_AMBULATORY_CARE_PROVIDER_SITE_OTHER): Payer: Medicaid Other

## 2023-08-14 VITALS — BP 102/66 | HR 90 | Temp 98.5°F | Resp 20 | Ht 74.5 in | Wt 382.8 lb

## 2023-08-14 DIAGNOSIS — O99012 Anemia complicating pregnancy, second trimester: Secondary | ICD-10-CM

## 2023-08-14 DIAGNOSIS — D508 Other iron deficiency anemias: Secondary | ICD-10-CM

## 2023-08-14 DIAGNOSIS — Z3A21 21 weeks gestation of pregnancy: Secondary | ICD-10-CM

## 2023-08-14 DIAGNOSIS — Z9884 Bariatric surgery status: Secondary | ICD-10-CM | POA: Diagnosis not present

## 2023-08-14 MED ORDER — IRON SUCROSE 20 MG/ML IV SOLN
200.0000 mg | Freq: Once | INTRAVENOUS | Status: AC
  Administered 2023-08-14: 200 mg via INTRAVENOUS
  Filled 2023-08-14: qty 10

## 2023-08-14 MED ORDER — ACETAMINOPHEN 325 MG PO TABS
650.0000 mg | ORAL_TABLET | Freq: Once | ORAL | Status: AC
  Administered 2023-08-14: 650 mg via ORAL
  Filled 2023-08-14: qty 2

## 2023-08-14 MED ORDER — DIPHENHYDRAMINE HCL 25 MG PO CAPS
25.0000 mg | ORAL_CAPSULE | Freq: Once | ORAL | Status: AC
  Administered 2023-08-14: 25 mg via ORAL
  Filled 2023-08-14: qty 1

## 2023-08-14 NOTE — Progress Notes (Signed)
Diagnosis: Iron Deficiency Anemia  Provider:  Chilton Greathouse MD  Procedure: IV Infusion  IV Type: Peripheral, IV Location: R Hand  Venofer (Iron Sucrose), Dose: 200 mg  Infusion Start Time: 1558  Infusion Stop Time: 1605  Post Infusion IV Care: Observation period completed and Peripheral IV Discontinued  Discharge: Condition: Good, Destination: Home . AVS Declined  Performed by:  Loney Hering, LPN

## 2023-08-17 ENCOUNTER — Ambulatory Visit (INDEPENDENT_AMBULATORY_CARE_PROVIDER_SITE_OTHER): Payer: Medicaid Other

## 2023-08-17 VITALS — BP 91/60 | HR 89 | Temp 97.5°F | Resp 20 | Ht 74.5 in | Wt 382.0 lb

## 2023-08-17 DIAGNOSIS — D508 Other iron deficiency anemias: Secondary | ICD-10-CM | POA: Diagnosis not present

## 2023-08-17 DIAGNOSIS — O99012 Anemia complicating pregnancy, second trimester: Secondary | ICD-10-CM

## 2023-08-17 DIAGNOSIS — Z3A22 22 weeks gestation of pregnancy: Secondary | ICD-10-CM | POA: Diagnosis not present

## 2023-08-17 DIAGNOSIS — F439 Reaction to severe stress, unspecified: Secondary | ICD-10-CM | POA: Diagnosis not present

## 2023-08-17 MED ORDER — ACETAMINOPHEN 325 MG PO TABS
650.0000 mg | ORAL_TABLET | Freq: Once | ORAL | Status: AC
  Administered 2023-08-17: 650 mg via ORAL
  Filled 2023-08-17: qty 2

## 2023-08-17 MED ORDER — IRON SUCROSE 20 MG/ML IV SOLN
200.0000 mg | Freq: Once | INTRAVENOUS | Status: AC
  Administered 2023-08-17: 200 mg via INTRAVENOUS
  Filled 2023-08-17: qty 10

## 2023-08-17 MED ORDER — DIPHENHYDRAMINE HCL 25 MG PO CAPS
25.0000 mg | ORAL_CAPSULE | Freq: Once | ORAL | Status: AC
  Administered 2023-08-17: 25 mg via ORAL
  Filled 2023-08-17: qty 1

## 2023-08-17 NOTE — Progress Notes (Signed)
Diagnosis: Iron Deficiency Anemia  Provider:  Chilton Greathouse MD  Procedure: IV Push  IV Type: Peripheral, IV Location: R Hand  Venofer (Iron Sucrose), Dose: 200 mg  Post Infusion IV Care: Observation period completed and Peripheral IV Discontinued  Discharge: Condition: Good, Destination: Home . AVS Declined  Performed by:  Loney Hering, LPN

## 2023-08-19 ENCOUNTER — Ambulatory Visit (INDEPENDENT_AMBULATORY_CARE_PROVIDER_SITE_OTHER): Payer: Medicaid Other

## 2023-08-19 VITALS — BP 107/73 | HR 77 | Temp 98.3°F | Resp 16 | Ht 74.5 in | Wt 382.6 lb

## 2023-08-19 DIAGNOSIS — D508 Other iron deficiency anemias: Secondary | ICD-10-CM | POA: Diagnosis not present

## 2023-08-19 DIAGNOSIS — Z3A22 22 weeks gestation of pregnancy: Secondary | ICD-10-CM

## 2023-08-19 DIAGNOSIS — O99012 Anemia complicating pregnancy, second trimester: Secondary | ICD-10-CM

## 2023-08-19 MED ORDER — IRON SUCROSE 20 MG/ML IV SOLN
200.0000 mg | Freq: Once | INTRAVENOUS | Status: AC
  Administered 2023-08-19: 200 mg via INTRAVENOUS
  Filled 2023-08-19: qty 10

## 2023-08-19 MED ORDER — ACETAMINOPHEN 325 MG PO TABS
650.0000 mg | ORAL_TABLET | Freq: Once | ORAL | Status: AC
  Administered 2023-08-19: 650 mg via ORAL
  Filled 2023-08-19: qty 2

## 2023-08-19 MED ORDER — DIPHENHYDRAMINE HCL 25 MG PO CAPS
25.0000 mg | ORAL_CAPSULE | Freq: Once | ORAL | Status: AC
  Administered 2023-08-19: 25 mg via ORAL
  Filled 2023-08-19: qty 1

## 2023-08-19 NOTE — Progress Notes (Signed)
Diagnosis: Iron Deficiency Anemia  Provider:  Chilton Greathouse MD  Procedure: IV Push  IV Type: Peripheral, IV Location: L Hand  Venofer (Iron Sucrose), Dose: 200 mg  Post Infusion IV Care: Observation period completed and Peripheral IV Discontinued  Discharge: Condition: Good, Destination: Home . AVS Declined  Performed by:  Rico Ala, LPN

## 2023-08-20 ENCOUNTER — Telehealth: Payer: Self-pay | Admitting: Family Medicine

## 2023-08-20 NOTE — Telephone Encounter (Signed)
Patient has requested to be dropped from [facility] due to her work schedule.

## 2023-08-21 ENCOUNTER — Ambulatory Visit (INDEPENDENT_AMBULATORY_CARE_PROVIDER_SITE_OTHER): Payer: Medicaid Other

## 2023-08-21 VITALS — BP 107/66 | HR 94 | Temp 98.5°F | Resp 18 | Ht 74.5 in | Wt 381.0 lb

## 2023-08-21 DIAGNOSIS — O99012 Anemia complicating pregnancy, second trimester: Secondary | ICD-10-CM

## 2023-08-21 DIAGNOSIS — Z3A22 22 weeks gestation of pregnancy: Secondary | ICD-10-CM

## 2023-08-21 DIAGNOSIS — D508 Other iron deficiency anemias: Secondary | ICD-10-CM | POA: Diagnosis not present

## 2023-08-21 MED ORDER — DIPHENHYDRAMINE HCL 25 MG PO CAPS
25.0000 mg | ORAL_CAPSULE | Freq: Once | ORAL | Status: AC
  Administered 2023-08-21: 25 mg via ORAL
  Filled 2023-08-21: qty 1

## 2023-08-21 MED ORDER — ACETAMINOPHEN 325 MG PO TABS
650.0000 mg | ORAL_TABLET | Freq: Once | ORAL | Status: AC
  Administered 2023-08-21: 650 mg via ORAL
  Filled 2023-08-21: qty 2

## 2023-08-21 MED ORDER — IRON SUCROSE 20 MG/ML IV SOLN
200.0000 mg | Freq: Once | INTRAVENOUS | Status: AC
  Administered 2023-08-21: 200 mg via INTRAVENOUS
  Filled 2023-08-21: qty 10

## 2023-08-21 NOTE — Progress Notes (Signed)
Diagnosis: Iron Deficiency Anemia  Provider:  Chilton Greathouse MD  Procedure: IV Push  IV Type: Peripheral, IV Location: L Hand  Venofer (Iron Sucrose), Dose: 200 mg  Post Infusion IV Care: Observation period completed and Peripheral IV Discontinued  Discharge: Condition: Good, Destination: Home . AVS Declined  Performed by:  Rico Ala, LPN

## 2023-08-24 ENCOUNTER — Other Ambulatory Visit: Payer: Self-pay

## 2023-08-24 ENCOUNTER — Ambulatory Visit: Payer: Medicaid Other | Attending: Obstetrics

## 2023-08-24 ENCOUNTER — Other Ambulatory Visit: Payer: Self-pay | Admitting: *Deleted

## 2023-08-24 VITALS — BP 110/63 | HR 88

## 2023-08-24 DIAGNOSIS — O99012 Anemia complicating pregnancy, second trimester: Secondary | ICD-10-CM

## 2023-08-24 DIAGNOSIS — O0990 Supervision of high risk pregnancy, unspecified, unspecified trimester: Secondary | ICD-10-CM | POA: Diagnosis not present

## 2023-08-24 DIAGNOSIS — E669 Obesity, unspecified: Secondary | ICD-10-CM

## 2023-08-24 DIAGNOSIS — Z98891 History of uterine scar from previous surgery: Secondary | ICD-10-CM | POA: Diagnosis not present

## 2023-08-24 DIAGNOSIS — Z8632 Personal history of gestational diabetes: Secondary | ICD-10-CM | POA: Diagnosis not present

## 2023-08-24 DIAGNOSIS — O10912 Unspecified pre-existing hypertension complicating pregnancy, second trimester: Secondary | ICD-10-CM | POA: Diagnosis not present

## 2023-08-24 DIAGNOSIS — O99210 Obesity complicating pregnancy, unspecified trimester: Secondary | ICD-10-CM | POA: Diagnosis not present

## 2023-08-24 DIAGNOSIS — O09292 Supervision of pregnancy with other poor reproductive or obstetric history, second trimester: Secondary | ICD-10-CM | POA: Diagnosis not present

## 2023-08-24 DIAGNOSIS — Z3A23 23 weeks gestation of pregnancy: Secondary | ICD-10-CM | POA: Diagnosis not present

## 2023-08-24 DIAGNOSIS — O99212 Obesity complicating pregnancy, second trimester: Secondary | ICD-10-CM | POA: Insufficient documentation

## 2023-08-24 DIAGNOSIS — O34219 Maternal care for unspecified type scar from previous cesarean delivery: Secondary | ICD-10-CM | POA: Diagnosis not present

## 2023-08-24 DIAGNOSIS — O10012 Pre-existing essential hypertension complicating pregnancy, second trimester: Secondary | ICD-10-CM | POA: Diagnosis not present

## 2023-08-24 DIAGNOSIS — D563 Thalassemia minor: Secondary | ICD-10-CM

## 2023-08-24 DIAGNOSIS — O09299 Supervision of pregnancy with other poor reproductive or obstetric history, unspecified trimester: Secondary | ICD-10-CM | POA: Diagnosis not present

## 2023-08-24 DIAGNOSIS — O10919 Unspecified pre-existing hypertension complicating pregnancy, unspecified trimester: Secondary | ICD-10-CM

## 2023-08-25 DIAGNOSIS — Z9889 Other specified postprocedural states: Secondary | ICD-10-CM | POA: Diagnosis not present

## 2023-08-25 DIAGNOSIS — M25562 Pain in left knee: Secondary | ICD-10-CM | POA: Diagnosis not present

## 2023-09-02 ENCOUNTER — Other Ambulatory Visit: Payer: Self-pay

## 2023-09-02 ENCOUNTER — Ambulatory Visit (INDEPENDENT_AMBULATORY_CARE_PROVIDER_SITE_OTHER): Payer: Medicaid Other | Admitting: Obstetrics and Gynecology

## 2023-09-02 VITALS — BP 120/78 | HR 92 | Wt 382.0 lb

## 2023-09-02 DIAGNOSIS — Z6841 Body Mass Index (BMI) 40.0 and over, adult: Secondary | ICD-10-CM

## 2023-09-02 DIAGNOSIS — Z3A24 24 weeks gestation of pregnancy: Secondary | ICD-10-CM

## 2023-09-02 DIAGNOSIS — O0992 Supervision of high risk pregnancy, unspecified, second trimester: Secondary | ICD-10-CM

## 2023-09-02 DIAGNOSIS — O0990 Supervision of high risk pregnancy, unspecified, unspecified trimester: Secondary | ICD-10-CM

## 2023-09-02 DIAGNOSIS — O10919 Unspecified pre-existing hypertension complicating pregnancy, unspecified trimester: Secondary | ICD-10-CM

## 2023-09-02 DIAGNOSIS — O10912 Unspecified pre-existing hypertension complicating pregnancy, second trimester: Secondary | ICD-10-CM

## 2023-09-02 DIAGNOSIS — Z98891 History of uterine scar from previous surgery: Secondary | ICD-10-CM

## 2023-09-02 DIAGNOSIS — Z9884 Bariatric surgery status: Secondary | ICD-10-CM

## 2023-09-02 DIAGNOSIS — F439 Reaction to severe stress, unspecified: Secondary | ICD-10-CM | POA: Diagnosis not present

## 2023-09-02 DIAGNOSIS — O99210 Obesity complicating pregnancy, unspecified trimester: Secondary | ICD-10-CM

## 2023-09-02 DIAGNOSIS — O99212 Obesity complicating pregnancy, second trimester: Secondary | ICD-10-CM

## 2023-09-02 NOTE — Patient Instructions (Signed)
[place] [phone]) [facility] Owens Shark, MD; Erin Hearing, MD; Gwendlyn Deutscher, MD; Andria Frames, MD; McDiarmid, MD; Dutch Quint, [place]., [place], [place] [address] [phone] Mon-Fri 8:30-12:30, 1:30-5:00  Providers come to see babies during newborn hospitalization Only accepting infants of Mother's who are seen at [facility] or have siblings seen at   [place] Medicaid - Yes; [place], MD [address] [place], [place] [address] [phone] Mon, Tue, Thur, Fri 8:30-5:00, Wed 10:00-7:00 (closed 1-2pm daily for lunch) [facility] residents with no insurance.  [place] only with Medicaid/insurance; Tricare - no  [facility]) - Tim and [facility], MD; Owens Shark, MD; Tamera Punt, MD; Doneen Poisson, MD; Fatima Sanger, MD; Lindwood Qua, MD; Thornell Sartorius, MD; Ronnald Ramp,  MD; Wynetta Emery, MD; Jess Barters, MD; Tami Ribas, MD; Derrell Lolling, MD; Dorothyann Peng, MD; Lucious Groves, NP [address], [place], [place] [address] [phone] Mon, Tue, Thur, Fri 8:30-5:30, Wed 9:30-5:30, Sat 8:30-12:30 Only accepting infants of first-time parents or siblings of current patients Hospital discharge coordinator will make follow-up appointment Medicaid - yes; Tricare - yes  [place] [phone]) [facility] Garnette Czech, MD; Rosana Hoes, MD; Servando Salina, MD; Rose Fillers, MD; Corinna Capra, MD; [facility], MD; Javier Glazier, MD; Janann Colonel, MD; Jimmye Norman, [place], [place], [place] [address] [phone] Mon-Fri 8:30-5:00, closed for lunch 12:30-1:30; Sat-Sun 10:00-1:00 Accepting Newborns with commercial insurance only, must call prior to delivery to be accepted into  practice.  Medicaid - no, Tricare - yes   Goleta [place], [place] [address] [phone] or [phone] Mon to Fri 8am to 10pm, Sat 8am to 1pm  (virtual only on weekends) Only accepts Medicaid Healthy Blue pts  [facility] ([facility]) - Pediatrics at Rogelia Boga, MD; Vilma Prader, MD; Vanita Panda, MD; Roxanne Mins, NP; Nestor Lewandowsky, MD; Ronney Lion, MD [address] [place], [place] [address] [phone] Mon-Fri 8:30-5:30 Medicaid - yes, Tricare - yes  [place] [phone]) [facility] Burn[facility]ette Pop, MD [address], [place], [place] [address] [phone] Sammuel Cooper, Wed Fri 8:30-5:00, Sat 8:30-12:00, Closed Thursdays Accepting siblings of established patients and first time mom's if you call prenatally Medicaid- yes; Tricare - yes  [place] at Arlina Robes, [place]; Mannie Stabile, MD; Jerald Kief; [place], [place]; Nancy Fetter, MD; Moreen Fowler, MD;  [address] [place], [place] [address] [phone] Mon-Fri 8:30-5:00, closed for lunch 1-2 Only accepting newborns of established patients Medicaid- no; Tricare - yes  [facility] [id number]) [place] at Leane Para, MD; [place], [place], [place] [address] [phone] Mon-Fri 8:00-5:00 Medicaid - No; Tricare - Yes  [place] at [facility], [place]; [place], [place], [place], [place] [address] [phone] Mon-Fri 8:00-5:00 Medicaid - No, Tricare - Yes  [facility] Abner Greenspan, MD; Sheran Lawless, MD; [place], [place] [address] [place] [address] [place], [place] [address] [phone]  Mon-Fri 8:00-5:00 Medicaid - No; Tricare - Yes  [address] [place], [place] [address] [phone] Mon-Fri 8:30-5:00 (lunch 12:00-1:00) Medicaid -Yes; [place] at [place], MD W[place], [place], [place] [address] [phone] Mon-Fri 8:00-5:00 Seeing newborns of current patients only. No new patients Medicaid - No, Tricare - yes  Therapist, music at [place], [place]  [place]., [place],  [address] [phone] Mon-Fri 8:00-5:00 Medicaid -yes as secondary coverage only;  Tricare - yes  [place], [place]; [place], [place]; Albertina Parr, MD; Frederic Jericho, MD; Ronney Lion, MD; [place], [place]; Smoot, NP; Corinna Lines, MD; [place], [place]., [place], [place] [address] [phone] Mon-Fri 8:30-5:00, Sat 9:00-11:00 Accepts commercial insurance ONLY. Offers free prenatal information sessions for families. Medicaid - No, Tricare - Call first  Halliday, MD; [place], [place]; [place], [place]; [place], Wall[place]., [place] [address] [phone] Mon-Fri 7:30-5:30 Medicaid - Yes; Marchelle Gearing yes  [place] [phone] & [phone])  [facility], [address] [place], [place] [address] [phone] Mon-Thur 8:00-6:00, closed for lunch 12-2, closed Fridays Medicaid - yes; Tricare - no  [place], NP; Melford Aase, MD; [place], [place]; [place], [place] [address]., [place], [place], [place] [address] [phone] Mon-Fri 7:30-4:30 Medicaid - yes, Tricare - yes  [facility]  Carolynn Sayers, MD; Cristino Martes, NP; Gertie Baron, MD; Debara Pickett, NP [place], [place], [place] [address] [phone] Mon-Fri 8:30-5:00, closed for lunch 1-2, Sat 8:30-12:00 - sick visits only Providers come to see babies at [facility] Only accepting newborns of siblings and first time parents ONLY if who have met with office prior to delivery Medicaid -Yes; Tricare - yes  [place], [place]; Fredderick Severance, NP; Juleen China, MD; Clydene Laming, MD:  [place] [address], [place], [place] [address] [phone] Mon- Fri 8:00-5:00, Sat 9:00-12:00 - sick visits only Accepting siblings of established patients and first time mom/baby Medicaid - Yes; Tricare - yes Patients must have vaccinations (baby vaccines)  [place]west [place] [phone] &  [phone])  [place] at [place]., [place], [place] [address] [phone] Mon-Fri 8:00-5:00 Medicaid - no; Tricare - yes  [place], MD; [place], [place]; [place], [place] [address], [place], [place] [address] [phone] Mon-Fri 8:00-5:00 Medicaid- yes; Tricare - yes  [place], MD; Ronnald Ramp, NP; [place], [place] [address] [place], [place], [place] [address] [phone] Mon-Fri 8:00-5:00 Medicaid - Yes; Tricare - yes  [address] [place] [phone])  [place], [place]; [place], [place]; Maisie Fus, MD; Charlesetta Garibaldi, MD; [place], NP; Isenhour, DO; [place], [place]; Jeannine Kitten, MD; Sheila Oats, MD; Hardin Negus, MD; [place], [place]; [employer], [place]; [place], [place] [address] [place], [place], [place] [address] [phone] Mon-Fri 8:30-5:00, Sat 9:00-12:00 - sick only Please register online [email or web address] then schedule online or call office Medicaid-Yes; Tricare -yes  [facility] Premier  Dab[place]co, MD; Delora Fuel, MD; [place], MD; Sherrill, NP; [place], [place]; Everette Rank, MD; Radford Pax, NP; Melina Modena, MD [address] [place], [place], [place] [address] [phone] Mon-Fri 8:00-5:30, Sat&Sun by appointment (phones open at 8:30) Medicaid - Yes; Tricare - yes  [place] [phone] & [phone]) Hubbard, CPNP; [place], MD; Araceli Bouche, MD; Jerelene Redden, NP; [place], DO [address], [place], [place] [address] [phone] M-F 8:00 - 5:15, Sat/Sun 9-12 sick visits only Medicaid - No; Tricare - yes  [place], PA-C; [place], PA-C; [place], DO; Kennard, PA-C; [place], PA-C; Vassie Moselle, [place]., [place],  [address] [phone] Mon-Thur 8:00-7:00, Fri 8:00-5:00 Accepting Medicaid for 13 and under only   [facility] Family Medicine  at [place] ([facility] - [facility]) [place], [place]; List, FNP; Selinda Eon, MD; [place], PA-C; Scholer,  MD; Modena Nunnery, FNP; Everlean Patterson, FNP; Tempie Donning, MD; Selinda Eon, [place] [address], [place], [place] [address] [phone] Mon-Fri 8:30-5:30 Medicaid - Yes; Tricare - yes  [place], [place]; Rolla Plate, MD; Carola Rhine, MD; Tyron Russell, MD; [place], NP [address], [place], [place] [address] [phone] Mon-Thur 8:00-5:30, Fri 8:00-5:00, Sat 9:00-12:00 Medicaid - yes, Tricare - yes  [place] [phone])  [place] at [facility] [place], [place]; Olen Pel, MD; [place], [place], [place], [place] [address] [phone] Mon-Fri 8:00-5:00, closed for lunch 12-1 Medicaid - No; [place] - yes  Therapist, music at [facility], Vowin[place]el [place], [place], [place] [address] [phone] Mon-Fri 8:00-5:00 Medicaid - No; Tricare - yes  [facility], MD; Joelene Millin, MD; Teryl Lucy, MD; Ronnald Ramp, MD [place], [place], [place] [address] [phone] Mon-Fri 8:00-5:00 Medicaid- Yes; Tricare - yes  [place] [phone])  [place] at [facility], [place]; [place], MD [address] [name] [address] [place], [place], [place] [address] [phone] Mon-Fri 8:00-5:00 Medicaid - No; [place] - yes  [place] [name] [employer] [place], [place], [place] [address] [phone] Mon-Weds 8:00-6:00, Thurs-Fri 8:00-5:00, Sat 9:00-12:00 Medicaid - yes; Tricare - yes   [place], MD; [place], [place] [address] [place], [place] [address] [phone] Mon-Fri 8:00-5:00 Medicaid - yes; [place] - yes  [facility] Pediatric Providers  [facility] [address], [place], [place] [address] [phone] Gentry Roch: 8am -8pm, Tues, Weds: 8am - 5pm; Fri: 8-1 Medicaid - Yes; Tricare -  yes  [facility] Ilda Mori, MD; Wynetta Emery, MD; Rock Nephew, MD; [place], [place]; [place], [place] [address] [place], [place] [address] [phone] M-F 8:30 - 5:00 Medicaid - Call office; Tricare -yes  [facility] Fredderick Erb, MD; Wynelle Cleveland, MD, [name] Banas, MD; Koren Bound, PNP; Terrial Rhodes, [place] [address], [place], [place] [address] [phone] M-F 8:30 - 5:00, Sat/Sun 8:30 - 12:30 (sick visits) Medicaid - Call office; Tricare -yes  [facility] Bobby Rumpf, MD; Wynetta Emery, PNP; Jaynie Crumble, MD; [place], [place]; [place], NP; Orson Aloe [address] [place], [place], [place] [address] [phone] M-F 8:30 - 5:00 Medicaid - Call office; Tricare - yes  [facility] Collene Leyden, MD; Marney Doctor, MD; Melburn Hake, MD; Franki Cabot, MD; Nogo, MD [phone] [address] [place], [place] [address] [phone] M-Thur: 8:00 - 5:00; Fri: 8:00 - 4:00 Medicaid - yes; Tricare - yes  [facility] [address] [place], [place] [address] [phone] M-F: 8:30- 5:00, closed for lunch 12:30 - 1:00 Medicaid - yes; Tricare -yes  [place] [address] [place], [place] [id number] [phone] M-F 8:00 - 5:00 Medicaid - yes; Tricare - yes  Roosevelt, DO; Kenedy, DO; [place], [place] [address], [place], [place] [address] [phone] M-F 8:00 - 5:00, Closed 12-1 for lunch Medicaid - Call; Tricare - yes  [place], MD [address], [place], [place] [address] [phone] M-F: 8:00-5:00, Sat: 8:00 - noon Medicaid - call; [place] -yes  [facility] Douglas, FNP-C [name] [place], [place], [place] [address] [phone] M-W: 8:00-5:00, Thur: 8:00 - 7:00, Fri: 8:00 - noon Medicaid - yes; Tricare - yes  [place], [place], [place],  [place] [address] [phone] M-F 8:00 - 5:00, Closed for lunch 12-1 Medicaid -  yes; [place] - yes  [facility] Pediatric Providers  [facility] at [place], [place], Trilby Drummer, MD, [place], [place], [facility], [place], [place], [place] [address] [phone] M-T 8:00-5:00, Wed-Fri 7:00-6:00 Medicaid - Yes; Tricare -yes  [place] at [facility], [place]; [address] [place], [place], [place] [address] [phone] M-F 8:00 - 5:00, closed for lunch 12-1 Medicaid - Yes; Tricare - yes  [facility]  Drema Dallas, MD; Marilynn Rail, MD; Kerby Less, MD; Margaretmary Eddy, MD; Damita Dunnings, MD; Deidre Ala, MD; Arrie Eastern, MD, Leward Quan, MD; Sherren Mocha, MD; Emmit Pomfret, MD; Morton Stall, MD; Clydene Laming, MD [address] [place], [place] [address] [phone] M-F 8:00-5:00 Medicaid - yes; Tricare - yes  [facility] Sharon, MD (speaks [place] and [place]) [address] [place], [place] [address] [phone] M-F: 8:30 - 5:00, closed 12:30 - 1 for lunch Medicaid - Yes; [place] -yes  [facility] Pediatric Providers  [facility] Delice Lesch, MD; [place] Noe, MD; Lavone Neri, [place], [place], [place] [address] [phone] M-Th: 8:00 - 5:30, Fri: 8:00 - 12:00 Medicaid - yes; Tricare - yes  [facility] at [facility], NP; Verlee Monte, MD; Nadeen Landau, MD [place] [address] [place], [place] [address] [phone] M-F: 8:00 - 5:00 Medicaid - yes; Tricare - yes  [facility], NP; Deon Pilling, NP; Angelica Pou, NP; Manya Silvas, MD; Jimmye Norman, MD, [place]ell, NP, Glo Herring, MD; Cathleen Fears [address], [place], [place] [address] [phone] M-F: 8:30 - 5:30p Medicaid - yes; Tricare - yes Other locations available as well  [facility], MD; Redmond Pulling, MD; Marcine Matar, [place], [place], [place] [address] [phone] M-W: 8:00am - 7:00pm, Thurs: 8:00am - 8:00pm; Fri: 8:00am -  5:00pm, closed daily from 12-1 for lunch Medicaid - yes; [place] - yes  [facility] Pediatric Providers  [facility] at Robb Matar, MD; Donella Stade, [place]; Carrolyn Meiers, MD; Johnnette Litter, MD; [place], [place]; Leonie Man; [place], [place]; Alcario Drought, MD;  [address], [place], [place] [address] [phone] Jerilynn Mages - Ludwig Clarks: [place], Sat 9-noon Medicaid - Yes; Tricare -yes  [facility] at Smitty Knudsen, MD; Ronnald Ramp, FNP; Sherryle Lis, MD; Teryl Lucy, [place]. Sharyl Nimrod, [phone] M-F 8:00 - 5:00 Medicaid - call; Tricare - yes  [facility]- Lollie Sails, MD; [place], [place]; Lawana Chambers, MD; Ouida Sills, MD; Red Christians, MD; Marzetta Board, MD; Emiliano Dyer; Randolm Idol, MD; Derald Macleod, MD; [place], [place], [place], [place] [address] [phone] M-F 8:00am - 5:00pm; Sat. 9:00 - 11:00 Medicaid - yes; Tricare - yes  [facility] at [facility], MD [address] [place], [place] [address] [phone] M-F 8:00 - 5:00 Medicaid - [place] Medicaid only; Tricare - yes  [facility] - Signa Kell, MD; Rosana Hoes, [place]; Gordan Payment, [place], [place], [place] [address] [phone] M-F 8:00 - 5:00 Medicaid - yes; Tricare - yes  [facility] [address] [facility] - Francine Graven, MD; Owens Shark, MD, [facility], MD, [place], MD; [place], MD; Tamala Julian, MD; [address] Nyoka Lint [place], [place] [address] [phone] M-F: 8-5 Medicaid - yes; [place] - yes  [place], [place]; Alta Sierra, MD; [address] [place],  [address] [phone] M-F 8-5 Medicaid - yes; Tricare - yes  [address] [place] Joycelyn Rua, MD; Joelene Millin, MD;  [place]-Court[place], MD; [place]ell[place]-Palmer, DNP; Espino, Ethan [place], [place], [place] [address] [phone] M-F 8-5 Medicaid - yes; Tricare - yes  [facility] Danelle Earthly, MD;  Vinson Moselle; Lucie Leather, MD [address] [place], [place] [id number] [phone] M-F 7am - 5 pm Medicaid - call; Tricare - yes  [place], [place]; Louanne Skye, MD; Quentin Cornwall, [place], [place], [place] [address] [phone] M-F 8-5 Medicaid - yes; Tricare - yes  [facility] Pascal Lux, MD; Suzan Slick, MD; Jimmye Norman, [place]; Rolena Infante, [place]; Thornton Papas, [place]; Robinette Haines; [facility] - FNP [address], [place], [place] [address] [phone] M-F 8-5 Medicaid- yes; Tricare - yes  [facility] - [place] Sellers, Lively and Willa Frater, MD; Dianna Rossetti, MD; Frances Maywood, MD; Nicole Kindred, MD; [place], [place]; Marijean Bravo, MD; Louanne Skye, MD; Benjamine Mola, MD [address], [place], [place] [address] [phone] M-F: 8-5, Sat: 9-4, Sun 9-12 Medicaid - yes; Tricare - yes  [place], PNP; Rosana Hoes, [place] [address] Nyoka Lint [place], [place] [address] [phone] M-F 8 - 5, closed 12-1 for lunch Medicaid - yes; Tricare - yes  [place], MD; Enid Derry, MD; Benjamine Mola, MD; [place], [place], [place], [place] [address] [phone] M-F 8- 5:30 Medicaid - yes; Tricare - yes  [place], MD; Jeanell Sparrow, MD; Bartholome Bill, [place], [place], [place] [address] [phone] Jerilynn Mages: Sharyne Richters; Tues-Fri: 8-5; Sat: 9-12 Medicaid - yes; Tricare - yes  Signa Kell [facility] - Myrene Buddy, MD; Bjorn Loser, MD; Carlis Abbott, MD; Claudean Kinds, MD; Erik Obey, MD [address] [place], [place] [address] [phone] Jerilynn MagesMarland Kitchen Sharyne RichtersDarrin Luis: 8-5; Sat: 8:30-12:30 Medicaid - yes; Tricare - yes  Dareen Piano [place], MD; [place], [place], [place], [place] [address] [phone] Mon-Fri: 8-5 Medicaid - yes;  Tricare - yes  [facility] - [place] Beasley, CPNP; [place], [place]; Benjamine Mola, MD; Donivan Scull, MD; Noel Journey, MD; [address] [place], [place] [address] [phone] M-F: 8-5, closed 1-2 for lunch Medicaid - yes; Tricare - yes  [place], [place]; [place], [place]; Tamala Julian, MD; [place], CPNP; [place], [place]; [place], MD; Juleen China, MD [address], [place], [place], [place] [address] [phone] M-Thurs: Sharyne Richters; Fri: 8-6; Sat: 9-12; Sun 2-4 Medicaid - yes; Tricare - yes  Signa Kell [facility] [place], MD; Vernard Gambles, MD; Carol Ada, FNP; Marjorie Smolder, DO; [address] [place], [place] [address] [phone] M-F: 8-5 Medicaid - yes; [place] - yes  [facility] Pediatric Providers  [place], MD; [place], [place], [place], [place] [address] [phone] M - Fri: 8am - 5pm, closed for lunch 12-1 Medicaid - Yes; Tricare - yes  [facility] [place], MD; Ovid Curd, MD; Sanger, DO; Vinocur, MD;Hall, PA; Volanda Napoleon, [place]; Megan Salon, NP [id number] [address], [place], [place] [address] [phone] M-F 8:00 - 5:00, Sat 8:00 - 11:30 Medicaid - yes; Tricare - yes  [facility] Humphrey Rolls, MD; [place], MD, [address] MD, [place], MD, Draper, MD; [place], NP; [place], [place];  [address] [place], [place] [address] [phone] M-F 8:10am - 5:00pm Medicaid - yes; Tricare - yes  Hamilton, MD; Hubbard, NP [address], [place], [place] [address] [phone] M-F 8:00 - 5:00 Medicaid - [place] Medicaid only; Tricare - yes  [place], [place]; [place], NP [place], [place], [place] [address] [phone] M-F 8:00 - 5:00; Closed for lunch 12 - 1:00 Medicaid - yes;  Tricare - yes  [place], MD; Darius Bump, [place], [place], [place] [address] [phone] Mon 9-5; Tues/Wed 10-5; Thurs 8:30-5; Fri: 8-12:30 Medicaid - yes; Tricare - yes  [facility] Pediatric Providers  [facility]  [place], MD; [place], [place] [address] [place], [place] [address] [phone] phone [phone] fax M-F 7:15 - 4:30 Medicaid - yes; Tricare - yes  [place] [facility] Anastasio Champion, MD; [place], DO [address], [place], [place] [address] [phone] M-Fri: 8:30 - 5:00, closed for lunch everyday noon - 1pm Medicaid - Yes; Tricare - yes  [facility] Burdine, MD; Quillian Quince, MD; Nadara Mustard, MD; Quintin Alto, MD; [place], [place]; Antony Blackbird, [place]; [place], [place]; [place], [place]; [place] on [place], [place] [place], [place] [address] [phone] M-Thurs: 7:30am - 7:00pm; Friday 7:30am - 4pm; Sat: 8:00 - 1:00 Medicaid - Yes; Tricare - yes  [place] [facility] Freida Busman, MD; Lanny Cramp, MD; Janit Bern, MD; [place], [place]. [address], [place], [place], [place] [address] [phone] M-Thur: 8:00 - 5:00, Fri: 8:00 - Noon Medicaid - yes; Tricare - yes No [place] Dettinger, MD; Lajuana Ripple, DO; [place], NP; Hassell Done, NP; Lilia Pro, NP; Jeneen Montgomery, NP; Thayer Ohm, NP; Livia Snellen, MD; [place], [place] [address] [place], [place] [address] [phone] M-F 8:00 - 5:00 Medicaid - yes; Tricare - yes  Randlett Collins, FNP-C; [place], FNP-C [address] Mindi Slicker, [place] [address] [phone] M, W, R 8:00-5:00, Tues: 8:00am - 7:00pm; Fri 8:00 - noon Medicaid - Yes; Tricare - yes  [facility], MD [place],  [address] [phone]  M-Thurs 8:30-5:30, Fri: 8:30-12:30pm Medicaid - Yes; Tricare - N

## 2023-09-02 NOTE — Progress Notes (Signed)
   PRENATAL VISIT NOTE  Subjective:  Maureen Lewis is a 27 y.o. Z6X0960 at [redacted]w[redacted]d being seen today for ongoing prenatal care.  She is currently monitored for the following issues for this high-risk pregnancy and has OSA (obstructive sleep apnea); PCOS (polycystic ovarian syndrome); Vitamin D deficiency; Prediabetes; Alpha thalassemia trait; Supervision of high risk pregnancy, antepartum; History of gestational diabetes mellitus (GDM) in prior pregnancy, currently pregnant; Obesity in pregnancy; History of C-section; S/P laparoscopic sleeve gastrectomy; Idiopathic intracranial hypertension; Abnormal Pap smear of cervix; Headache in pregnancy, antepartum, first trimester; Chronic hypertension during pregnancy, antepartum; and Iron deficiency anemia on their problem list.  Patient reports no complaints.  Contractions: Not present. Vag. Bleeding: None.  Movement: Present. Denies leaking of fluid.   The following portions of the patient's history were reviewed and updated as appropriate: allergies, current medications, past family history, past medical history, past social history, past surgical history and problem list.   Objective:   Vitals:   09/02/23 1052  BP: 120/78  Pulse: 92  Weight: (!) 382 lb (173.3 kg)    Fetal Status: Fetal Heart Rate (bpm): 141   Movement: Present     General:  Alert, oriented and cooperative. Patient is in no acute distress.  Skin: Skin is warm and dry. No rash noted.   Cardiovascular: Normal heart rate noted  Respiratory: Normal respiratory effort, no problems with respiration noted  Abdomen: Soft, gravid, appropriate for gestational age.  Pain/Pressure: Present (pressure)     Pelvic: Cervical exam deferred        Extremities: Normal range of motion.  Edema: Trace  Mental Status: Normal mood and affect. Normal behavior. Normal judgment and thought content.   Assessment and Plan:  Pregnancy: G4P1021 at [redacted]w[redacted]d 1. Supervision of high risk pregnancy,  antepartum 28wk labs next visit. Pt states she did fine with her early GTT BC options d/w her. Potentially nexplanon S/p IV iron  2. Chronic hypertension during pregnancy, antepartum Doing well on no meds. Continue low dose ASA  3. S/P laparoscopic sleeve gastrectomy Already getting serial growth u/s. Baseline labs wnl   4. History of C-section 2020 PLTCS for failed 37wk IOL for HTN. D/w her re: this and tolac vs rpt c/s vs rpt c/s after failed IOL. D/w her re: varying risks, such as rupture risks, need for future c/s, increased infection/bleeding/pain after faied IOL, etc and pt would like to tolac if goes into labor and possibly even if needs IOL. F/u at future visits.  Tolac consent signed today  5. BMI 40.0-44.9, adult (HCC)  6. Obesity in pregnancy  Preterm labor symptoms and general obstetric precautions including but not limited to vaginal bleeding, contractions, leaking of fluid and fetal movement were reviewed in detail with the patient. Please refer to After Visit Summary for other counseling recommendations.   Return in about 3 weeks (around 09/23/2023) for in person, md or app, high risk ob, fasting 2hr GTT.  Future Appointments  Date Time Provider Department Center  09/23/2023  8:15 AM Osborne Oman [facility]  09/28/2023  8:20 AM [facility]  09/28/2023 11:15 AM [facility] NURSE [facility]  09/28/2023 11:30 AM [facility] US5 [facility]  11/03/2023  8:20 AM Tobb, Lavona Mound, DO [facility] None    [place] Bing, MD

## 2023-09-15 ENCOUNTER — Encounter: Payer: Self-pay | Admitting: *Deleted

## 2023-09-15 ENCOUNTER — Inpatient Hospital Stay (HOSPITAL_COMMUNITY)
Admission: AD | Admit: 2023-09-15 | Discharge: 2023-09-15 | Disposition: A | Discharge status: Home / Self Care | Payer: Medicaid Other | Attending: Obstetrics & Gynecology | Admitting: Obstetrics & Gynecology

## 2023-09-15 ENCOUNTER — Encounter: Payer: Self-pay | Admitting: Obstetrics and Gynecology

## 2023-09-15 ENCOUNTER — Encounter (HOSPITAL_COMMUNITY): Payer: Self-pay | Admitting: Obstetrics & Gynecology

## 2023-09-15 DIAGNOSIS — O26892 Other specified pregnancy related conditions, second trimester: Secondary | ICD-10-CM | POA: Diagnosis not present

## 2023-09-15 DIAGNOSIS — Z3A26 26 weeks gestation of pregnancy: Secondary | ICD-10-CM | POA: Diagnosis not present

## 2023-09-15 DIAGNOSIS — Z888 Allergy status to other drugs, medicaments and biological substances status: Secondary | ICD-10-CM | POA: Diagnosis not present

## 2023-09-15 DIAGNOSIS — T7840XA Allergy, unspecified, initial encounter: Secondary | ICD-10-CM | POA: Diagnosis not present

## 2023-09-15 MED ORDER — FAMOTIDINE 20 MG PO TABS: 40.0000 mg | ORAL_TABLET | Freq: Once | ORAL | Status: DC

## 2023-09-15 MED ORDER — FAMOTIDINE 40 MG PO TABS
20.0000 mg | ORAL_TABLET | Freq: Two times a day (BID) | ORAL | 6 refills | Status: DC
Start: 2023-09-15 — End: 2023-11-03

## 2023-09-15 NOTE — MAU Provider Note (Addendum)
History     CSN: [id number] Arrival date and time: 09/15/23 1735   Chief Complaint  Patient presents with   Allergic Reaction   Vaginal Pain   Maureen Lewis is a G4P1021 at [redacted]w[redacted]d presenting after being directed here by office staff for allergic symptoms. She reports noting allergic sx the past week. Last Wednesday, noticed itching, facial swelling, and facial rash (describes as red, splotchy bumps). Took a benadryl, which helped. Had another reaction with a facial rash on Friday. Had another episode today where she had facial swelling and throat numbness, tingling, and swelling. Took a benadryl around noon today. Episodes have all occurred at work ([employer]). Denies new medications, foods, cleaning or shower products. Denies cough, fevers, diarrhea, nausea, vomiting, and abdominal pain. Current medications are PNV, aspirin, and benadryl as above.  She reports intermittent vaginal pain/pressure. She has contractions every 30 min that are mild in nature.   Prenatal Care: [facility]    OB History     Gravida  4   Para  1   Term  1   Preterm  0   AB  2   Living  1      SAB  2   IAB  0   Ectopic  0   Multiple  0   Live Births  1           Past Medical History:  Diagnosis Date   Abnormal uterine bleeding    Acid reflux    Alpha thalassemia trait    Amenorrhea    Anemia    no current med.   Asthma    Back pain    Chest pain    resolved   Complication of anesthesia    states had to keep giving her anesthesia during EGD   Constipation    Depression    "I'm good"   Fibromyalgia    Finger mass, left 03/2017   middle finger   Gestational hypertension 09/23/2019   Guidelines for Antenatal Testing and Sonography  (with updated ICD-10 codes)  Updated  Oct 07, 2019 with Dr. Noralee Space  INDICATION U/S 2 X week NST/AFI  or full BPP wkly DELIVERY Diabetes   A1 - good control - O24.410    A2 - good control - O24.419      A2  - poor control or poor compliance  - O24.419, E11.65   (Macrosomia or polyhydramnios) **E11.65 is extra code for poor control**    A2/B - O24.   Headache    Hepatitis 07/18/2022   Hepatitis B   History of kidney stones    Hypertension    Irritable bowel syndrome (IBS)    Joint pain    Morbid obesity with body mass index (BMI) of 45.0 to 49.9 in adult [facility])    Plantar fasciitis, bilateral    resolved   Polycystic ovary syndrome    Pre-diabetes    no meds, diet controlled, diet not check blood sugar   Shortness of breath    albuterol inhaler   Sleep apnea    no CPAP use   Vertigo 2017    Past Surgical History:  Procedure Laterality Date   CESAREAN SECTION N/A 10/09/2019   Procedure: CESAREAN SECTION;  Surgeon: [place] Bing, MD;  Location: [facility];  Service: Obstetrics;  Laterality: N/A;   COLONOSCOPY WITH PROPOFOL  10/07/2016   DILATION AND CURETTAGE OF UTERUS     ESOPHAGOGASTRODUODENOSCOPY  12/21/2015   EXCISION MASS UPPER EXTREMETIES Left 04/21/2017  Procedure: EXCISION MASS LEFT MIDDLE FINGER;  Surgeon: Cindee Salt, MD;  Location: [facility];  Service: Orthopedics;  Laterality: Left;   gastric sleeve  05/09/2022   IR FL GUIDED LOC OF NEEDLE/CATH TIP FOR SPINAL INJECTION RT  03/13/2020   KNEE ARTHROSCOPY WITH LATERAL MENISECTOMY Left 10/31/2022   Procedure: KNEE ARTHROSCOPY WITH PARTIAL LATERAL MENISECTOMY;  Surgeon: Yolonda Kida, MD;  Location: [facility]S;  Service: Orthopedics;  Laterality: Left;  77   KNEE ARTHROSCOPY WITH LATERAL RELEASE Left 10/31/2022   Procedure: KNEE ARTHROSCOPY WITH LATERAL RELEASE AND LIMITED SYNOVECTOMY;  Surgeon: Yolonda Kida, MD;  Location: [facility] ORS;  Service: Orthopedics;  Laterality: Left;  75   PHOTOCOAGULATION WITH LASER Left 12/13/2020   Procedure: RETINOPLEXY LEFT EYE;  Surgeon: Stephannie Li, MD;  Location: [facility] OR;  Service: Ophthalmology;  Laterality: Left;   SCLERAL BUCKLE Right 12/13/2020   Procedure: SCLERAL BUCKLE WITH CRYO;  Surgeon:  Stephannie Li, MD;  Location: [facility] OR;  Service: Ophthalmology;  Laterality: Right;    Family History  Problem Relation Age of Onset   Diabetes Maternal Aunt    Hypertension Maternal Uncle    Depression Maternal Grandfather    Diabetes Maternal Grandfather    Hypertension Maternal Grandfather    Arthritis Mother    High blood pressure Mother    Depression Mother    Sleep apnea Mother    Obesity Mother    Diabetes Mother    Hypertension Mother    Kidney failure Mother    Sleep apnea Father    Obesity Father    Healthy Daughter     Social History   Tobacco Use   Smoking status: Never   Smokeless tobacco: Never  Vaping Use   Vaping status: Never Used  Substance Use Topics   Alcohol use: Not Currently    Comment: social    Drug use: No    Allergies:  Allergies  Allergen Reactions   Bee Venom Hives, Shortness Of Breath and Swelling   Hydrocodone-Acetaminophen Anaphylaxis    No problem when she takes Tylenol   Peach Flavor Hives, Shortness Of Breath and Other (See Comments)    SWELLING OF MOUTH   Pollen Extract Hives, Shortness Of Breath and Swelling   Remdesivir Swelling    Angioedema 8/25   Latex Rash   Cortisone Itching and Swelling   Cortizone-10 [Hydrocortisone]     Swelling and itching   Toradol [Ketorolac Tromethamine] Swelling    Medications Prior to Admission  Medication Sig Dispense Refill Last Dose   albuterol (VENTOLIN HFA) 108 (90 Base) MCG/ACT inhaler Inhale 1 puff into the lungs every 4 (four) hours as needed for shortness of breath.   Past Month   aspirin EC 81 MG tablet Take 2 tablets (162 mg total) by mouth daily. Swallow whole. 180 tablet 12 09/15/2023   Prenatal Vit-Fe Phos-FA-Omega (VITAFOL GUMMIES) 3.33-0.333-34.8 MG CHEW Chew 1 tablet by mouth daily. 90 tablet 3 09/15/2023   Blood Pressure Monitoring (BLOOD PRESSURE KIT) DEVI 1 Device by Does not apply route as needed. 1 each 0    cyclobenzaprine (FLEXERIL) 10 MG tablet Take 1 tablet (10 mg  total) by mouth 3 (three) times daily as needed for muscle spasms. (Patient not taking: Reported on 09/02/2023) 40 tablet 2    famotidine (PEPCID) 20 MG tablet Take 1 tablet (20 mg total) by mouth 2 (two) times daily. (Patient not taking: Reported on 09/02/2023) 30 tablet 0    fluticasone (FLONASE) 50 MCG/ACT nasal  spray Place 2 sprays into both nostrils daily. (Patient not taking: Reported on 09/02/2023) 16 g 6    loratadine (CLARITIN) 10 MG tablet Take 10 mg by mouth daily as needed for allergies. (Patient not taking: Reported on 09/02/2023)      metoCLOPramide (REGLAN) 10 MG tablet Take 1 tablet (10 mg total) by mouth every 6 (six) hours. (Patient not taking: Reported on 09/02/2023) 30 tablet 0    scopolamine (TRANSDERM-SCOP) 1 MG/3DAYS Place 1 patch (1.5 mg total) onto the skin every 3 (three) days. (Patient not taking: Reported on 09/02/2023) 10 patch 6     Review of Systems  Constitutional:  Negative for chills and fever.  HENT:  Negative for congestion and sore throat.   Eyes:  Negative for pain and visual disturbance.  Respiratory:  Negative for cough, chest tightness and shortness of breath.   Cardiovascular:  Negative for chest pain.  Gastrointestinal:  Negative for abdominal pain, diarrhea, nausea and vomiting.  Endocrine: Negative for cold intolerance and heat intolerance.  Genitourinary:  Negative for dysuria and flank pain.  Musculoskeletal:  Negative for back pain.  Skin:  Positive for rash.  Allergic/Immunologic: Positive for environmental allergies and food allergies.  Neurological:  Negative for dizziness and light-headedness.  Psychiatric/Behavioral:  Negative for agitation.    Physical Exam   Blood pressure (!) 126/56, pulse 91, temperature 98.4 F (36.9 C), temperature source Oral, resp. rate 18, height 6' 2.5" (1.892 m), weight (!) 172.4 kg, last menstrual period 03/07/2023, SpO2 98%.  Physical Exam Gen: laying in bed, awake and conversant, in NAD CV: RRR, normal  S1/S2 Pulm: CTAB, normal WOB on RA Derm: no rashes or lesions on face or neck HEENT: clear conjunctiva, no rhinorrhea, no erythema or lesions in oropharynx    MAU Course  Procedures  NST: baseline 150 bpm, moderate variability, no accelerations or decelerations  MDM: moderate  This patient presents to the ED for concern of   Chief Complaint  Patient presents with   Allergic Reaction   Vaginal Pain     These complains involves an extensive number of treatment options, and is a complaint that carries with it a high risk of complications and morbidity.  The differential diagnosis for  1. Allergic reaction, unknown trigger   Co morbidities that complicate the patient evaluation: Patient Active Problem List   Diagnosis Date Noted   Iron deficiency anemia 08/10/2023   Chronic hypertension during pregnancy, antepartum 07/20/2023   Headache in pregnancy, antepartum, first trimester 06/05/2023   Abnormal Pap smear of cervix 05/13/2023   Supervision of high risk pregnancy, antepartum 04/30/2023   History of gestational diabetes mellitus (GDM) in prior pregnancy, currently pregnant 04/30/2023   Obesity in pregnancy 04/30/2023   History of C-section 04/30/2023   S/P laparoscopic sleeve gastrectomy 05/20/2022   Idiopathic intracranial hypertension 06/13/2021   Alpha thalassemia trait    Vitamin D deficiency 09/16/2017   Prediabetes 09/16/2017   PCOS (polycystic ovarian syndrome) 08/13/2017   OSA (obstructive sleep apnea) 08/28/2016     External records from outside source obtained and reviewed including CareEverywhere and Prenatal care records  Lab Tests: none  I ordered, and personally interpreted labs.  The pertinent results include:  None  Medicines ordered and prescription drug management:  Medications: Pecid   Reevaluation of the patient after these medicines showed that the patient improved I have reviewed the patients home medicines and have made adjustments as  needed   MAU Course: No rash and symptoms were resolved by  the time she was evaluated.    After the interventions noted above, I reevaluated the patient and found that they have :improved  Dispostion: discharged   Assessment and Plan   Possible allergic reaction Patient hemodynamically stable without current signs or symptoms of an acute allergic reaction. Unclear trigger for symptoms, suspect cleaning product or other exposure at work as symptoms have exclusively occurred there.  --advised Famotidine for symptoms prophylaxis --continue Benadryl for symptom relief as needed  Maureen Lewis 09/15/2023, 6:37 PM   Future Appointments  Date Time Provider Department Center  09/23/2023  8:15 AM Bernerd Limbo, CNM [facility]  09/28/2023  8:20 AM [facility]  09/28/2023 11:15 AM [facility] NURSE [facility] [place]  09/28/2023 11:30 AM [facility] US5 [facility]  11/03/2023  8:20 AM Tobb, Lavona Mound, DO [facility] None     Attestation of Attending Supervision of Resident: Evaluation and management procedures were performed by the [facility] Resident under my supervision.  I have reviewed the Resident's note and chart, and I agree with the management and plan.  Federico Flake, MD, MPH, ABFM Attending Physician Faculty Practice- Center for [facility]

## 2023-09-15 NOTE — MAU Note (Signed)
.  Maureen Lewis is a 27 y.o. at [redacted]w[redacted]d here in MAU reporting: "I have been having an allergic reaction for the last week and today it got worse", reports she doesn't know what has been causing the reaction (has not been evaluated anywhere). Reports she has been taking benadryl and it has helped but today she has felt ichiness in her throat and feels like her throat is swelling. Pt in NAD at this time. Last dose of benadryl was at 12 noon. Reports contractions off and on for the last week but only reports uc q 30 minutes or so. Reports positive fetal movement. Onset of complaint: one week Pain score: 6/10 vaginal area.  There were no vitals filed for this visit.    Lab orders placed from triage:

## 2023-09-21 ENCOUNTER — Encounter: Payer: Self-pay | Admitting: Certified Nurse Midwife

## 2023-09-22 NOTE — Progress Notes (Unsigned)
   PRENATAL VISIT NOTE  Subjective:  Maureen Lewis is a 27 y.o. Z6X0960 at [redacted]w[redacted]d being seen today for ongoing prenatal care.  She is currently monitored for the following issues for this {Blank single:19197::"high-risk","low-risk"} pregnancy and has OSA (obstructive sleep apnea); PCOS (polycystic ovarian syndrome); Vitamin D deficiency; Prediabetes; Alpha thalassemia trait; Supervision of high risk pregnancy, antepartum; History of gestational diabetes mellitus (GDM) in prior pregnancy, currently pregnant; Obesity in pregnancy; History of C-section; S/P laparoscopic sleeve gastrectomy; Idiopathic intracranial hypertension; Abnormal Pap smear of cervix; Headache in pregnancy, antepartum, first trimester; Chronic hypertension during pregnancy, antepartum; and Iron deficiency anemia on their problem list.  Patient reports {sx:14538}.   .  .   . Denies leaking of fluid.   The following portions of the patient's history were reviewed and updated as appropriate: allergies, current medications, past family history, past medical history, past social history, past surgical history and problem list.   Objective:  There were no vitals filed for this visit.  Fetal Status:           General:  Alert, oriented and cooperative. Patient is in no acute distress.  Skin: Skin is warm and dry. No rash noted.   Cardiovascular: Normal heart rate noted  Respiratory: Normal respiratory effort, no problems with respiration noted  Abdomen: Soft, gravid, appropriate for gestational age.        Pelvic: {Blank single:19197::"Cervical exam performed in the presence of a chaperone","Cervical exam deferred"}        Extremities: Normal range of motion.     Mental Status: Normal mood and affect. Normal behavior. Normal judgment and thought content.   Assessment and Plan:  Pregnancy: G4P1021 at [redacted]w[redacted]d 1. Supervision of high risk pregnancy in second trimester ***  2. [redacted] weeks gestation of pregnancy ***  3.  History of C-section ***  4. History of gestational diabetes mellitus (GDM) in prior pregnancy, currently pregnant ***  5. Chronic hypertension during pregnancy, antepartum ***  {Blank single:19197::"Term","Preterm"} labor symptoms and general obstetric precautions including but not limited to vaginal bleeding, contractions, leaking of fluid and fetal movement were reviewed in detail with the patient. Please refer to After Visit Summary for other counseling recommendations.   No follow-ups on file.  Future Appointments  Date Time Provider Department Center  09/23/2023  9:55 AM Osborne Oman [facility]  09/28/2023  8:20 AM [facility]  09/28/2023 11:15 AM [facility] NURSE [facility]  09/28/2023 11:30 AM [facility] US5 [facility]  11/03/2023  8:20 AM Tobb, Lavona Mound, DO [facility] None    Bernerd Limbo, CNM

## 2023-09-23 ENCOUNTER — Other Ambulatory Visit: Payer: Medicaid Other

## 2023-09-23 ENCOUNTER — Other Ambulatory Visit: Payer: Self-pay

## 2023-09-23 ENCOUNTER — Ambulatory Visit (INDEPENDENT_AMBULATORY_CARE_PROVIDER_SITE_OTHER): Payer: Medicaid Other | Admitting: Certified Nurse Midwife

## 2023-09-23 ENCOUNTER — Encounter: Payer: Self-pay | Admitting: Certified Nurse Midwife

## 2023-09-23 VITALS — BP 126/75 | HR 88 | Wt 383.0 lb

## 2023-09-23 DIAGNOSIS — F5104 Psychophysiologic insomnia: Secondary | ICD-10-CM

## 2023-09-23 DIAGNOSIS — O10919 Unspecified pre-existing hypertension complicating pregnancy, unspecified trimester: Secondary | ICD-10-CM

## 2023-09-23 DIAGNOSIS — O99283 Endocrine, nutritional and metabolic diseases complicating pregnancy, third trimester: Secondary | ICD-10-CM

## 2023-09-23 DIAGNOSIS — E282 Polycystic ovarian syndrome: Secondary | ICD-10-CM

## 2023-09-23 DIAGNOSIS — O99342 Other mental disorders complicating pregnancy, second trimester: Secondary | ICD-10-CM

## 2023-09-23 DIAGNOSIS — Z3009 Encounter for other general counseling and advice on contraception: Secondary | ICD-10-CM

## 2023-09-23 DIAGNOSIS — Z98891 History of uterine scar from previous surgery: Secondary | ICD-10-CM

## 2023-09-23 DIAGNOSIS — O09292 Supervision of pregnancy with other poor reproductive or obstetric history, second trimester: Secondary | ICD-10-CM

## 2023-09-23 DIAGNOSIS — O10013 Pre-existing essential hypertension complicating pregnancy, third trimester: Secondary | ICD-10-CM

## 2023-09-23 DIAGNOSIS — O99842 Bariatric surgery status complicating pregnancy, second trimester: Secondary | ICD-10-CM

## 2023-09-23 DIAGNOSIS — Z8639 Personal history of other endocrine, nutritional and metabolic disease: Secondary | ICD-10-CM

## 2023-09-23 DIAGNOSIS — O0992 Supervision of high risk pregnancy, unspecified, second trimester: Secondary | ICD-10-CM

## 2023-09-23 DIAGNOSIS — O09299 Supervision of pregnancy with other poor reproductive or obstetric history, unspecified trimester: Secondary | ICD-10-CM

## 2023-09-23 DIAGNOSIS — O99212 Obesity complicating pregnancy, second trimester: Secondary | ICD-10-CM

## 2023-09-23 DIAGNOSIS — Z3A27 27 weeks gestation of pregnancy: Secondary | ICD-10-CM

## 2023-09-23 DIAGNOSIS — E669 Obesity, unspecified: Secondary | ICD-10-CM

## 2023-09-23 MED ORDER — MAG-OXIDE 200 MG PO TABS: 400.0000 mg | ORAL_TABLET | Freq: Every day | ORAL | 3 refills | Status: DC

## 2023-09-28 ENCOUNTER — Ambulatory Visit: Payer: Medicaid Other

## 2023-09-28 ENCOUNTER — Ambulatory Visit: Payer: Medicaid Other | Attending: Obstetrics and Gynecology

## 2023-09-28 ENCOUNTER — Other Ambulatory Visit: Payer: Self-pay

## 2023-09-28 ENCOUNTER — Ambulatory Visit: Payer: Medicaid Other | Admitting: *Deleted

## 2023-09-28 ENCOUNTER — Other Ambulatory Visit: Payer: Medicaid Other

## 2023-09-28 ENCOUNTER — Other Ambulatory Visit: Payer: Self-pay | Admitting: *Deleted

## 2023-09-28 VITALS — BP 132/66 | HR 89

## 2023-09-28 DIAGNOSIS — Z98891 History of uterine scar from previous surgery: Secondary | ICD-10-CM | POA: Diagnosis not present

## 2023-09-28 DIAGNOSIS — O34219 Maternal care for unspecified type scar from previous cesarean delivery: Secondary | ICD-10-CM | POA: Diagnosis not present

## 2023-09-28 DIAGNOSIS — O99213 Obesity complicating pregnancy, third trimester: Secondary | ICD-10-CM

## 2023-09-28 DIAGNOSIS — O10919 Unspecified pre-existing hypertension complicating pregnancy, unspecified trimester: Secondary | ICD-10-CM

## 2023-09-28 DIAGNOSIS — E669 Obesity, unspecified: Secondary | ICD-10-CM

## 2023-09-28 DIAGNOSIS — D573 Sickle-cell trait: Secondary | ICD-10-CM

## 2023-09-28 DIAGNOSIS — O09293 Supervision of pregnancy with other poor reproductive or obstetric history, third trimester: Secondary | ICD-10-CM

## 2023-09-28 DIAGNOSIS — O0990 Supervision of high risk pregnancy, unspecified, unspecified trimester: Secondary | ICD-10-CM | POA: Insufficient documentation

## 2023-09-28 DIAGNOSIS — O99013 Anemia complicating pregnancy, third trimester: Secondary | ICD-10-CM | POA: Diagnosis not present

## 2023-09-28 DIAGNOSIS — O99210 Obesity complicating pregnancy, unspecified trimester: Secondary | ICD-10-CM

## 2023-09-28 DIAGNOSIS — O10013 Pre-existing essential hypertension complicating pregnancy, third trimester: Secondary | ICD-10-CM | POA: Diagnosis not present

## 2023-09-28 DIAGNOSIS — Z3A28 28 weeks gestation of pregnancy: Secondary | ICD-10-CM

## 2023-09-28 DIAGNOSIS — Z8632 Personal history of gestational diabetes: Secondary | ICD-10-CM | POA: Insufficient documentation

## 2023-09-28 DIAGNOSIS — O09299 Supervision of pregnancy with other poor reproductive or obstetric history, unspecified trimester: Secondary | ICD-10-CM

## 2023-09-28 DIAGNOSIS — Z8639 Personal history of other endocrine, nutritional and metabolic disease: Secondary | ICD-10-CM

## 2023-09-29 LAB — CBC
Hematocrit: 37 % (ref 34.0–46.6)
Hemoglobin: 11.7 g/dL (ref 11.1–15.9)
MCH: 24.8 pg — ABNORMAL LOW (ref 26.6–33.0)
MCHC: 31.6 g/dL (ref 31.5–35.7)
MCV: 79 fL (ref 79–97)
Platelets: 220 10*3/uL (ref 150–450)
RBC: 4.71 x10E6/uL (ref 3.77–5.28)
RDW: 15.2 % (ref 11.7–15.4)
WBC: 5.1 10*3/uL (ref 3.4–10.8)

## 2023-09-29 LAB — HIV ANTIBODY (ROUTINE TESTING W REFLEX): HIV Screen 4th Generation wRfx: NONREACTIVE

## 2023-09-29 LAB — GLUCOSE TOLERANCE, 2 HOURS W/ 1HR
Glucose, 1 hour: 130 mg/dL (ref 70–179)
Glucose, 2 hour: 76 mg/dL (ref 70–152)
Glucose, Fasting: 79 mg/dL (ref 70–91)

## 2023-09-29 LAB — RPR: RPR Ser Ql: NONREACTIVE

## 2023-09-29 LAB — VITAMIN D 25 HYDROXY (VIT D DEFICIENCY, FRACTURES): Vit D, 25-Hydroxy: 10.2 ng/mL — ABNORMAL LOW (ref 30.0–100.0)

## 2023-09-29 MED ORDER — VITAMIN D (ERGOCALCIFEROL) 1.25 MG (50000 UNIT) PO CAPS: 50000.0000 [IU] | ORAL_CAPSULE | ORAL | 0 refills | Status: DC

## 2023-09-29 NOTE — Addendum Note (Signed)
Addended by: Edd Arbour on: 09/29/2023 04:48 PM   Modules accepted: Orders

## 2023-10-01 ENCOUNTER — Inpatient Hospital Stay (HOSPITAL_COMMUNITY)
Admission: AD | Admit: 2023-10-01 | Discharge: 2023-10-01 | Disposition: A | Discharge status: Home / Self Care | Payer: Medicaid Other | Attending: Obstetrics and Gynecology | Admitting: Obstetrics and Gynecology

## 2023-10-01 ENCOUNTER — Encounter (HOSPITAL_COMMUNITY): Payer: Self-pay | Admitting: Obstetrics and Gynecology

## 2023-10-01 DIAGNOSIS — Z3A28 28 weeks gestation of pregnancy: Secondary | ICD-10-CM | POA: Insufficient documentation

## 2023-10-01 DIAGNOSIS — N858 Other specified noninflammatory disorders of uterus: Secondary | ICD-10-CM | POA: Insufficient documentation

## 2023-10-01 DIAGNOSIS — R509 Fever, unspecified: Secondary | ICD-10-CM | POA: Diagnosis present

## 2023-10-01 DIAGNOSIS — O368130 Decreased fetal movements, third trimester, not applicable or unspecified: Secondary | ICD-10-CM | POA: Insufficient documentation

## 2023-10-01 DIAGNOSIS — O34219 Maternal care for unspecified type scar from previous cesarean delivery: Secondary | ICD-10-CM | POA: Diagnosis not present

## 2023-10-01 DIAGNOSIS — O99213 Obesity complicating pregnancy, third trimester: Secondary | ICD-10-CM | POA: Insufficient documentation

## 2023-10-01 DIAGNOSIS — O0990 Supervision of high risk pregnancy, unspecified, unspecified trimester: Secondary | ICD-10-CM

## 2023-10-01 DIAGNOSIS — O09299 Supervision of pregnancy with other poor reproductive or obstetric history, unspecified trimester: Secondary | ICD-10-CM

## 2023-10-01 DIAGNOSIS — Z98891 History of uterine scar from previous surgery: Secondary | ICD-10-CM

## 2023-10-01 DIAGNOSIS — O99210 Obesity complicating pregnancy, unspecified trimester: Secondary | ICD-10-CM

## 2023-10-01 DIAGNOSIS — O98813 Other maternal infectious and parasitic diseases complicating pregnancy, third trimester: Secondary | ICD-10-CM | POA: Diagnosis not present

## 2023-10-01 DIAGNOSIS — H66002 Acute suppurative otitis media without spontaneous rupture of ear drum, left ear: Secondary | ICD-10-CM | POA: Insufficient documentation

## 2023-10-01 DIAGNOSIS — Z8632 Personal history of gestational diabetes: Secondary | ICD-10-CM | POA: Insufficient documentation

## 2023-10-01 DIAGNOSIS — H9202 Otalgia, left ear: Secondary | ICD-10-CM | POA: Diagnosis present

## 2023-10-01 DIAGNOSIS — R519 Headache, unspecified: Secondary | ICD-10-CM | POA: Diagnosis present

## 2023-10-01 LAB — URINALYSIS, ROUTINE W REFLEX MICROSCOPIC
Bilirubin Urine: NEGATIVE
Glucose, UA: NEGATIVE mg/dL
Hgb urine dipstick: NEGATIVE
Ketones, ur: NEGATIVE mg/dL
Leukocytes,Ua: NEGATIVE
Nitrite: NEGATIVE
Protein, ur: NEGATIVE mg/dL
Specific Gravity, Urine: 1.026 (ref 1.005–1.030)
pH: 6 (ref 5.0–8.0)

## 2023-10-01 MED ORDER — ACETAMINOPHEN 500 MG PO TABS: 1000.0000 mg | ORAL_TABLET | Freq: Four times a day (QID) | ORAL | 0 refills | Status: DC | PRN

## 2023-10-01 MED ORDER — AMOXICILLIN-POT CLAVULANATE 875-125 MG PO TABS: 1.0000 | ORAL_TABLET | Freq: Two times a day (BID) | ORAL | 0 refills | Status: AC

## 2023-10-01 MED ORDER — CYCLOBENZAPRINE HCL 5 MG PO TABS: 7.5000 mg | ORAL_TABLET | Freq: Three times a day (TID) | ORAL | Status: DC | PRN

## 2023-10-01 MED ORDER — ACETAMINOPHEN 500 MG PO TABS
1000.0000 mg | ORAL_TABLET | Freq: Once | ORAL | Status: AC
  Administered 2023-10-01: 1000 mg via ORAL
  Filled 2023-10-01: qty 2

## 2023-10-01 MED ORDER — CYCLOBENZAPRINE HCL 5 MG PO TABS: 10.0000 mg | ORAL_TABLET | Freq: Three times a day (TID) | ORAL | Status: DC | PRN

## 2023-10-01 NOTE — MAU Note (Signed)
.  Maureen Lewis is a 27 y.o. at [redacted]w[redacted]d here in MAU reporting: headache and Left ear pain for 2-3 days a. Reports decreased fetal movement today and fever 100.4 this am. Reports back pain and cramping as well.  LMP:  Onset of complaint: 2-3 days Pain score: 8 Vitals:   10/01/23 1042  BP: 118/72  Pulse: 94  Resp: 18  Temp: 98.8 F (37.1 C)     FHT:148 Lab orders placed from triage:  u/a

## 2023-10-01 NOTE — MAU Provider Note (Signed)
Chief Complaint:  Headache, Otalgia, and Decreased Fetal Movement  HPI  HPI: Maureen Lewis is a 27 y.o. Y7W2956 at 22w4dwho presents to maternity admissions reporting left ear pain for 2-3 days, DFM today and a fever 100.4 this morning. She also endorses back pain and cramping which is not new.   Ear pain: Left > right, denies trauma or discharge, no recent colds or sick contacts. 100.34F temperature earlier today at work ([employer]). No hx of otitis media.   Back pain/cramping: Chronic denies worsening today  DFM: Baby has been moving since arrival per pt  She reports good fetal movement, denies LOF, vaginal bleeding, vaginal itching/burning, urinary symptoms, h/a, dizziness, n/v, diarrhea, constipation or fever/chills.  She denies headache, visual changes or RUQ abdominal pain.   Past Medical History: Past Medical History:  Diagnosis Date   Abnormal uterine bleeding    Acid reflux    Alpha thalassemia trait    Amenorrhea    Anemia    no current med.   Asthma    Back pain    Chest pain    resolved   Complication of anesthesia    states had to keep giving her anesthesia during EGD   Constipation    Depression    "I'm good"   Fibromyalgia    Finger mass, left 03/2017   middle finger   Gestational hypertension 09/23/2019   Guidelines for Antenatal Testing and Sonography  (with updated ICD-10 codes)  Updated  2019-09-17 with Dr. Noralee Space  INDICATION U/S 2 X week NST/AFI  or full BPP wkly DELIVERY Diabetes   A1 - good control - O24.410    A2 - good control - O24.419      A2  - poor control or poor compliance - O24.419, E11.65   (Macrosomia or polyhydramnios) **E11.65 is extra code for poor control**    A2/B - O24.   Headache    Hepatitis 07/18/2022   Hepatitis B   History of kidney stones    Hypertension    Irritable bowel syndrome (IBS)    Joint pain    Morbid obesity with body mass index (BMI) of 45.0 to 49.9 in adult [facility])    Plantar fasciitis,  bilateral    resolved   Polycystic ovary syndrome    Pre-diabetes    no meds, diet controlled, diet not check blood sugar   Prediabetes 09/16/2017   Early 1hr 70     Shortness of breath    albuterol inhaler   Sleep apnea    no CPAP use   Vertigo 2017    Past obstetric history: OB History  Gravida Para Term Preterm AB Living  4 1 1  0 2 1  SAB IAB Ectopic Multiple Live Births  2 0 0 0 1    # Outcome Date GA Lbr Len/2nd Weight Sex Type Anes PTL Lv  4 Current           3 SAB 2023             Birth Comments: had D&C  2 SAB 2021          1 Term 10/09/19 [redacted]w[redacted]d  3370 g F CS-Vac EPI  LIV     Birth Comments: GHTN, GDM, IOL for GHTN, C/S for decels    Past Surgical History: Past Surgical History:  Procedure Laterality Date   CESAREAN SECTION N/A 10/09/2019   Procedure: CESAREAN SECTION;  Surgeon: Fruitland Bing, MD;  Location: [facility];  Service:  Obstetrics;  Laterality: N/A;   COLONOSCOPY WITH PROPOFOL  10/07/2016   DILATION AND CURETTAGE OF UTERUS     ESOPHAGOGASTRODUODENOSCOPY  12/21/2015   EXCISION MASS UPPER EXTREMETIES Left 04/21/2017   Procedure: EXCISION MASS LEFT MIDDLE FINGER;  Surgeon: Cindee Salt, MD;  Location: [facility];  Service: Orthopedics;  Laterality: Left;   gastric sleeve  05/09/2022   IR FL GUIDED LOC OF NEEDLE/CATH TIP FOR SPINAL INJECTION RT  03/13/2020   KNEE ARTHROSCOPY WITH LATERAL MENISECTOMY Left 10/31/2022   Procedure: KNEE ARTHROSCOPY WITH PARTIAL LATERAL MENISECTOMY;  Surgeon: Yolonda Kida, MD;  Location: [facility]S;  Service: Orthopedics;  Laterality: Left;  75   KNEE ARTHROSCOPY WITH LATERAL RELEASE Left 10/31/2022   Procedure: KNEE ARTHROSCOPY WITH LATERAL RELEASE AND LIMITED SYNOVECTOMY;  Surgeon: Yolonda Kida, MD;  Location: [facility] ORS;  Service: Orthopedics;  Laterality: Left;  75   PHOTOCOAGULATION WITH LASER Left 12/13/2020   Procedure: RETINOPLEXY LEFT EYE;  Surgeon: Stephannie Li, MD;  Location: [facility] OR;  Service:  Ophthalmology;  Laterality: Left;   SCLERAL BUCKLE Right 12/13/2020   Procedure: SCLERAL BUCKLE WITH CRYO;  Surgeon: Stephannie Li, MD;  Location: [facility] OR;  Service: Ophthalmology;  Laterality: Right;    Family History: Family History  Problem Relation Age of Onset   Diabetes Maternal Aunt    Hypertension Maternal Uncle    Depression Maternal Grandfather    Diabetes Maternal Grandfather    Hypertension Maternal Grandfather    Arthritis Mother    High blood pressure Mother    Depression Mother    Sleep apnea Mother    Obesity Mother    Diabetes Mother    Hypertension Mother    Kidney failure Mother    Sleep apnea Father    Obesity Father    Healthy Daughter     Social History: Social History   Tobacco Use   Smoking status: Never   Smokeless tobacco: Never  Vaping Use   Vaping status: Never Used  Substance Use Topics   Alcohol use: Not Currently    Comment: social    Drug use: No    Allergies:  Allergies  Allergen Reactions   Bee Venom Hives, Shortness Of Breath and Swelling   Hydrocodone-Acetaminophen Anaphylaxis    No problem when she takes Tylenol   Peach Flavoring Agent (Non-Screening) Hives, Shortness Of Breath and Other (See Comments)    SWELLING OF MOUTH   Pollen Extract Hives, Shortness Of Breath and Swelling   Remdesivir Swelling    Angioedema 8/25   Latex Rash   Cortisone Itching and Swelling   Cortizone-10 [Hydrocortisone]     Swelling and itching   Toradol [Ketorolac Tromethamine] Swelling    Meds:  Medications Prior to Admission  Medication Sig Dispense Refill Last Dose/Taking   albuterol (VENTOLIN HFA) 108 (90 Base) MCG/ACT inhaler Inhale 1 puff into the lungs every 4 (four) hours as needed for shortness of breath.   Past Week   aspirin EC 81 MG tablet Take 2 tablets (162 mg total) by mouth daily. Swallow whole. 180 tablet 12 10/01/2023   Blood Pressure Monitoring (BLOOD PRESSURE KIT) DEVI 1 Device by Does not apply route as needed. 1 each 0  10/01/2023   fluticasone (FLONASE) 50 MCG/ACT nasal spray Place 2 sprays into both nostrils daily. 16 g 6 Past Week   loratadine (CLARITIN) 10 MG tablet Take 10 mg by mouth daily as needed for allergies.   Past Month   Magnesium Oxide -Mg Supplement (  MAG-OXIDE) 200 MG TABS Take 2 tablets (400 mg total) by mouth at bedtime. If that amount causes loose stools in the am, switch to 200mg  daily at bedtime. 60 tablet 3 10/01/2023   Prenatal Vit-Fe Phos-FA-Omega (VITAFOL GUMMIES) 3.33-0.333-34.8 MG CHEW Chew 1 tablet by mouth daily. 90 tablet 3 10/01/2023 Morning   Vitamin D, Ergocalciferol, (DRISDOL) 1.25 MG (50000 UNIT) CAPS capsule Take 1 capsule (50,000 Units total) by mouth every 7 (seven) days. 10 capsule 0 10/01/2023   cyclobenzaprine (FLEXERIL) 10 MG tablet Take 1 tablet (10 mg total) by mouth 3 (three) times daily as needed for muscle spasms. (Patient not taking: Reported on 09/02/2023) 40 tablet 2 More than a month   famotidine (PEPCID) 40 MG tablet Take 0.5 tablets (20 mg total) by mouth 2 (two) times daily. 60 tablet 6 More than a month   metoCLOPramide (REGLAN) 10 MG tablet Take 1 tablet (10 mg total) by mouth every 6 (six) hours. (Patient not taking: Reported on 09/02/2023) 30 tablet 0 More than a month   scopolamine (TRANSDERM-SCOP) 1 MG/3DAYS Place 1 patch (1.5 mg total) onto the skin every 3 (three) days. (Patient not taking: Reported on 09/02/2023) 10 patch 6 More than a month    I have reviewed patient's Past Medical Hx, Surgical Hx, Family Hx, Social Hx, medications and allergies.   ROS:  Review of Systems Other systems negative  Physical Exam  Patient Vitals for the past 24 hrs:  BP Temp Pulse Resp Height Weight  10/01/23 1042 118/72 98.8 F (37.1 C) 94 18 6' 2.5" (1.892 m) (!) 176.4 kg   Constitutional: Well-developed, well-nourished female in no acute distress.  Cardiovascular: normal rate and rhythm Respiratory: normal effort, clear to auscultation bilaterally GI: Abd  soft, non-tender, gravid appropriate for gestational age.   No rebound or guarding. MS: Extremities nontender, no edema, normal ROM Neurologic: Alert and oriented x 4.  GU: Neg CVAT.  FHT:  Baseline 135, moderate variability, accelerations present, no decelerations Contractions: None   Labs: No results found. However, due to the size of the patient record, not all encounters were searched. Please check Results Review for a complete set of results. A/Positive/-- (07/18 1228)  Imaging:  [name] MFM OB FOLLOW UP Result Date: 09/28/2023 ----------------------------------------------------------------------  OBSTETRICS REPORT                       (Signed Final 09/28/2023 12:14 pm) ---------------------------------------------------------------------- Patient Info  ID #:       [id number]                          D.O.B.:  Jun 04, 1996 (27 yrs)(F)  Name:       Tennis Ship RENEE              Visit Date: 09/28/2023 11:15 am              Fer[place] ---------------------------------------------------------------------- Performed By  Attending:        Ma Rings MD         Ref. Address:     [address]                                                             [place], [place]  [address]  Performed By:     Reinaldo Raddle            [facility]  Referred By:      [facility] ---------------------------------------------------------------------- Orders  #  Description                           Code        Ordered By  1  [name] MFM OB FOLLOW UP                   [phone]    Noralee Space ----------------------------------------------------------------------  #  Order #                     Accession #                 Episode #  1  [id number]                   [id number]                 [id number] ---------------------------------------------------------------------- Indications  Obesity complicating pregnancy, third          O99.213  trimester  Hypertension - Chronic/Pre-existing (no        O10.019  meds)  Poor obstetric history: Previous gestational   O09.299  diabetes  Genetic carrier (Alpha thal silent carrier)    Z14.8  Previous cesarean delivery, antepartum         O34.219  [redacted] weeks gestation of pregnancy                Z3A.28  LR NIPS - Female ---------------------------------------------------------------------- Fetal Evaluation  Num Of Fetuses:         1  Fetal Heart Rate(bpm):  143  Cardiac Activity:       Observed  Presentation:           Cephalic  Placenta:               Posterior  P. Cord Insertion:      Previously seen  Amniotic Fluid  AFI FV:      Within normal limits  AFI Sum(cm)     %Tile  Largest Pocket(cm)  20              80          6.94  RUQ(cm)       RLQ(cm)       LUQ(cm)        LLQ(cm)  6.76          6.94          3.64           2.66 ---------------------------------------------------------------------- Biometry  BPD:      75.1  mm     G. Age:  30w 1d         91  %    CI:        78.87   %    70 - 86                                                          FL/HC:      19.9   %    18.8 - 20.6  HC:      267.4  mm     G. Age:  29w 1d         50  %    HC/AC:      1.01        1.05 - 1.21  AC:      265.2  mm     G. Age:  30w 4d         96  %    FL/BPD:     71.0   %    71 - 87  FL:       53.3  mm     G. Age:  28w 2d         39  %    FL/AC:      20.1   %    20 - 24  LV:        3.5  mm  Est. FW:    1440  gm      3 lb 3 oz     91  % ---------------------------------------------------------------------- OB History  Gravidity:    4         Term:   1         SAB:   2  Living:       1 ---------------------------------------------------------------------- Gestational Age  LMP:           29w 2d        Date:  03/07/23                   EDD:   12/12/23  U/S Today:     29w 4d                                        EDD:   12/10/23  Best:          28w 1d     Det. By:  U/S C R L  (04/27/23)    EDD:   12/20/23 ---------------------------------------------------------------------- Targeted Anatomy  Central Nervous System  Calvarium/Cranial V.:  Appears normal         Cereb./Vermis:  Previously seen  Cavum:                 Appears normal         Cisterna Magna:         Previously seen  Lateral Ventricles:    Appears normal         Midline Falx:           Previously seen  Choroid Plexus:        Previously seen  Spine  Cervical:              Previously seen        Sacral:                 Previously seen  Thoracic:              Previously seen        Shape/Curvature:        Previously seen  Lumbar:                Previously seen  Head/Neck  Lips:                  Previously seen        Profile:                Appears normal  Neck:                  Previously seen        Orbits/Eyes:            Previously seen  Nuchal Fold:           Previously seen        Mandible:               Previously seen  Nasal Bone:            Present                Maxilla:                Previously seen  Thorax  4 Chamber View:        Previously seen        Interventr. Septum:     Previously seen  Cardiac Rhythm:        Normal                 Cardiac Axis:           Previously een  Cardiac Situs:         Previously seen        Diaphragm:              Appears normal  Rt Outflow Tract:      Previously seen        3 Vessel View:          Previously seen  Lt Outflow Tract:      Previously seen        3 V Trachea View:       Previously seen  Aortic Arch:           Previously seen        IVC:                    Previously seen  Ductal Arch:           Previously seen        Crossing:  Previously seen  SVC:                   Previously seen  Abdomen  Ventral Wall:          Previously seen        Lt Kidney:              Appears normal  Cord Insertion:         Previously seen        Rt Kidney:              Appears normal  Situs:                 Previously seen        Bladder:                Appears normal  Stomach:               Appears normal  Extremities  Lt Humerus:            Previously seen        Lt Femur:               Previously seen  Rt Humerus:            Previously seen        Rt Femur:               Previously seen  Lt Forearm:            Previously seen        Lt Lower Leg:           Previously seen  Rt Forearm:            Previously seen        Rt Lower Leg:           Previously seen  Lt Hand:               Visualized Prev        Lt Foot:                Foot visualized prev  Rt Hand:               Visualized Prev        Rt Foot:                Foot visualized prev  Other  Umbilical Cord:        Previously seen        Genitalia:              Female prev seen  Comment:     Technically difficult due to maternal habitus and fetal position. ---------------------------------------------------------------------- Cervix Uterus Adnexa  Cervix  Not visualized (advanced GA >24wks)  Uterus  No abnormality visualized.  Right Ovary  Not visualized.  Left Ovary  Not visualized.  Cul De Sac  No free fluid seen.  Adnexa  No adnexal mass visualized ---------------------------------------------------------------------- Comments  This patient was seen for a follow up growth scan due to  maternal obesity with a BMI of 48.  She denies any problems  since her last exam.  The results of her gestational diabetes  screening test is pending.  She was informed that the fetal growth and amniotic fluid  level appears appropriate for her gestational age.  Due to maternal obesity, we will start weekly fetal testing at  34 weeks.  She will return in 6  weeks for growth scan and BPP. ----------------------------------------------------------------------                   Ma Rings, MD Electronically Signed Final Report   09/28/2023 12:14 pm  ----------------------------------------------------------------------    MAU Course/MDM: I have reviewed the triage vital signs and the nursing notes.   Pertinent labs & imaging results that were available during my care of the patient were reviewed by me and considered in my medical decision making (see chart for details).      I have reviewed her medical records including past results, notes and treatments.   I have ordered labs and reviewed results.  NST reviewed  Treatments in MAU included NST, Tylenol, otoscope.    Assessment: 1. Supervision of high risk pregnancy, antepartum   2. History of gestational diabetes mellitus (GDM) in prior pregnancy, currently pregnant   3. Obesity in pregnancy   4. History of C-section   5. [redacted] weeks gestation of pregnancy   6. Non-recurrent acute suppurative otitis media of left ear without spontaneous rupture of tympanic membrane   Back pain and cramping persistent for the last few weeks without worsening, pt declines Flexeril trial  Otitis media - Tylenol for fever and pain - Rx for Augmentin 875 mg twice a day for 7 days sent to preferred pharmacy  Plan: Discharge home Complete full course of antibiotics Preterm labor precautions and fetal kick counts provided Follow up in Office for prenatal visits and recheck  Pt stable at time of discharge.  Wyn Forster, MD FMOB Fellow, Faculty practice [facility], Center for [facility]  10/01/2023 11:36 AM

## 2023-10-02 ENCOUNTER — Encounter: Payer: Self-pay | Admitting: Certified Nurse Midwife

## 2023-10-05 DIAGNOSIS — F439 Reaction to severe stress, unspecified: Secondary | ICD-10-CM | POA: Diagnosis not present

## 2023-10-06 NOTE — Progress Notes (Signed)
   PRENATAL VISIT NOTE  Subjective:  Maureen Lewis is a 27 y.o. N5A2130 at [redacted]w[redacted]d being seen today for ongoing prenatal care.  She is currently monitored for the following issues for this high-risk pregnancy and has OSA (obstructive sleep apnea); PCOS (polycystic ovarian syndrome); Vitamin D deficiency; Alpha thalassemia trait; Supervision of high-risk pregnancy; History of gestational diabetes mellitus (GDM) in prior pregnancy, currently pregnant; Obesity in pregnancy; History of C-section; S/P laparoscopic sleeve gastrectomy; Idiopathic intracranial hypertension; Abnormal Pap smear of cervix; Headache in pregnancy; Chronic hypertension affecting pregnancy; and Iron deficiency anemia on their problem list.  Patient reports vaginal irritation.  Contractions: Irregular. Vag. Bleeding: None.  Movement: Present. Denies leaking of fluid.   The following portions of the patient's history were reviewed and updated as appropriate: allergies, current medications, past family history, past medical history, past social history, past surgical history and problem list.   Objective:   Vitals:   10/07/23 1656  BP: 121/81  Pulse: (!) 105  Weight: (!) 387 lb 4.8 oz (175.7 kg)    Fetal Status: Fetal Heart Rate (bpm): 145 Fundal Height: 31 cm Movement: Present     General:  Alert, oriented and cooperative. Patient is in no acute distress.  Skin: Skin is warm and dry. No rash noted.   Cardiovascular: Normal heart rate noted  Respiratory: Normal respiratory effort, no problems with respiration noted  Abdomen: Soft, gravid, appropriate for gestational age.  Pain/Pressure: Present     Pelvic: Cervical exam deferred        Extremities: Normal range of motion.  Edema: Trace  Mental Status: Normal mood and affect. Normal behavior. Normal judgment and thought content.   Assessment and Plan:  Pregnancy: G4P1021 at [redacted]w[redacted]d 1. Supervision of high risk pregnancy in third trimester (Primary) - Doing well,  feeling regular and vigorous fetal movement   2. [redacted] weeks gestation of pregnancy - Routine OB care, passed her GTT  3. Vitamin D deficiency - Low, 5000IU prescribed  4. Iron deficiency anemia secondary to inadequate dietary iron intake - Resolved  5. History of C-section - Planning repeat, new BTL consent signed today  6. Pregnancy headache in third trimester - No headache today  7. Chronic hypertension affecting pregnancy - BP stable on ASA  8. Vaginal irritation - Likely yeast based on symptoms, diflucan prescribed per pt request, cervicoancillary swab collected  Preterm labor symptoms and general obstetric precautions including but not limited to vaginal bleeding, contractions, leaking of fluid and fetal movement were reviewed in detail with the patient. Please refer to After Visit Summary for other counseling recommendations.   Return in about 2 weeks (around 10/21/2023) for IN-PERSON, HOB.  Future Appointments  Date Time Provider Department Center  10/22/2023  2:55 PM Osborne Oman [facility]  11/03/2023  8:20 AM Tobb, Lavona Mound, DO [facility] None  11/04/2023  3:55 PM Bernerd Limbo, CNM [facility]  11/09/2023  2:15 PM [facility] NURSE [facility]  11/09/2023  2:30 PM [facility] US1 [facility]  11/16/2023  2:15 PM [facility] NURSE [facility]  11/16/2023  2:30 PM [facility] US3 [facility]  11/23/2023  2:15 PM [facility] NURSE [facility]  11/23/2023  2:30 PM [facility] US1 [facility]    Bernerd Limbo, CNM

## 2023-10-07 ENCOUNTER — Other Ambulatory Visit (HOSPITAL_COMMUNITY)
Admission: RE | Admit: 2023-10-07 | Discharge: 2023-10-07 | Disposition: A | Discharge status: Home / Self Care | Payer: Medicaid Other | Source: Ambulatory Visit | Attending: Certified Nurse Midwife | Admitting: Certified Nurse Midwife

## 2023-10-07 ENCOUNTER — Other Ambulatory Visit: Payer: Self-pay

## 2023-10-07 ENCOUNTER — Ambulatory Visit: Payer: Medicaid Other | Admitting: Certified Nurse Midwife

## 2023-10-07 VITALS — BP 121/81 | HR 105 | Wt 387.3 lb

## 2023-10-07 DIAGNOSIS — D508 Other iron deficiency anemias: Secondary | ICD-10-CM

## 2023-10-07 DIAGNOSIS — O0993 Supervision of high risk pregnancy, unspecified, third trimester: Secondary | ICD-10-CM | POA: Diagnosis not present

## 2023-10-07 DIAGNOSIS — O99113 Other diseases of the blood and blood-forming organs and certain disorders involving the immune mechanism complicating pregnancy, third trimester: Secondary | ICD-10-CM

## 2023-10-07 DIAGNOSIS — E559 Vitamin D deficiency, unspecified: Secondary | ICD-10-CM

## 2023-10-07 DIAGNOSIS — O34219 Maternal care for unspecified type scar from previous cesarean delivery: Secondary | ICD-10-CM

## 2023-10-07 DIAGNOSIS — O10919 Unspecified pre-existing hypertension complicating pregnancy, unspecified trimester: Secondary | ICD-10-CM

## 2023-10-07 DIAGNOSIS — Z3A29 29 weeks gestation of pregnancy: Secondary | ICD-10-CM

## 2023-10-07 DIAGNOSIS — Z98891 History of uterine scar from previous surgery: Secondary | ICD-10-CM

## 2023-10-07 DIAGNOSIS — N898 Other specified noninflammatory disorders of vagina: Secondary | ICD-10-CM

## 2023-10-07 DIAGNOSIS — O10013 Pre-existing essential hypertension complicating pregnancy, third trimester: Secondary | ICD-10-CM

## 2023-10-07 DIAGNOSIS — O99283 Endocrine, nutritional and metabolic diseases complicating pregnancy, third trimester: Secondary | ICD-10-CM

## 2023-10-07 DIAGNOSIS — O26893 Other specified pregnancy related conditions, third trimester: Secondary | ICD-10-CM

## 2023-10-07 DIAGNOSIS — R519 Headache, unspecified: Secondary | ICD-10-CM

## 2023-10-07 MED ORDER — FLUCONAZOLE 150 MG PO TABS: 150.0000 mg | ORAL_TABLET | ORAL | 0 refills | Status: DC

## 2023-10-07 MED ORDER — TERCONAZOLE 0.4 % VA CREA: 1.0000 | TOPICAL_CREAM | Freq: Every day | VAGINAL | 0 refills | Status: AC

## 2023-10-09 LAB — CERVICOVAGINAL ANCILLARY ONLY
Bacterial Vaginitis (gardnerella): NEGATIVE
Candida Glabrata: NEGATIVE
Candida Vaginitis: POSITIVE — AB
Chlamydia: NEGATIVE
Comment: NEGATIVE
Comment: NEGATIVE
Comment: NEGATIVE
Comment: NEGATIVE
Comment: NEGATIVE
Comment: NORMAL
Neisseria Gonorrhea: NEGATIVE
Trichomonas: NEGATIVE

## 2023-10-12 ENCOUNTER — Encounter: Payer: Self-pay | Admitting: *Deleted

## 2023-10-13 ENCOUNTER — Ambulatory Visit
Admission: EM | Admit: 2023-10-13 | Discharge: 2023-10-13 | Disposition: A | Discharge status: Home / Self Care | Payer: Medicaid Other

## 2023-10-13 DIAGNOSIS — S60511A Abrasion of right hand, initial encounter: Secondary | ICD-10-CM | POA: Diagnosis not present

## 2023-10-13 NOTE — Discharge Instructions (Signed)
Continue tylenol for pain Apply antibiotic ointment twice daily Wash with soap and water (no more peroxide or alcohol)

## 2023-10-13 NOTE — ED Provider Notes (Signed)
[facility]    CSN: [id number] Arrival date & time: 10/13/23  1059      History   Chief Complaint Chief Complaint  Patient presents with   Hand Injury    HPI Maureen Lewis is a 27 y.o. female.  Yesterday cut her right hand on a shopping cart Cleaned with peroxide last night Not bleeding. Somewhat painful. Used tylenol  Tdap in 2020 Also currently pregnant and has regular prenatal follow up  Past Medical History:  Diagnosis Date   Abnormal uterine bleeding    Acid reflux    Alpha thalassemia trait    Amenorrhea    Anemia    no current med.   Asthma    Back pain    Chest pain    resolved   Complication of anesthesia    states had to keep giving her anesthesia during EGD   Constipation    Depression    "I'm good"   Fibromyalgia    Finger mass, left 03/2017   middle finger   Gestational hypertension 09/23/2019   Guidelines for Antenatal Testing and Sonography  (with updated ICD-10 codes)  Updated  09-13-2019 with Dr. Noralee Space  INDICATION U/S 2 X week NST/AFI  or full BPP wkly DELIVERY Diabetes   A1 - good control - O24.410    A2 - good control - O24.419      A2  - poor control or poor compliance - O24.419, E11.65   (Macrosomia or polyhydramnios) **E11.65 is extra code for poor control**    A2/B - O24.   Headache    Hepatitis 07/18/2022   Hepatitis B   History of kidney stones    Hypertension    Irritable bowel syndrome (IBS)    Joint pain    Morbid obesity with body mass index (BMI) of 45.0 to 49.9 in adult [facility])    Plantar fasciitis, bilateral    resolved   Polycystic ovary syndrome    Pre-diabetes    no meds, diet controlled, diet not check blood sugar   Prediabetes 09/16/2017   Early 1hr 70     Shortness of breath    albuterol inhaler   Sleep apnea    no CPAP use   Vertigo 2017    Patient Active Problem List   Diagnosis Date Noted   Iron deficiency anemia 08/10/2023   Chronic hypertension affecting pregnancy 07/20/2023    Headache in pregnancy 06/05/2023   Abnormal Pap smear of cervix 05/13/2023   Supervision of high-risk pregnancy 04/30/2023   History of gestational diabetes mellitus (GDM) in prior pregnancy, currently pregnant 04/30/2023   Obesity in pregnancy 04/30/2023   History of C-section 04/30/2023   S/P laparoscopic sleeve gastrectomy 05/20/2022   Idiopathic intracranial hypertension 06/13/2021   Alpha thalassemia trait    Vitamin D deficiency 09/16/2017   PCOS (polycystic ovarian syndrome) 08/13/2017   OSA (obstructive sleep apnea) 08/28/2016    Past Surgical History:  Procedure Laterality Date   CESAREAN SECTION N/A 10/09/2019   Procedure: CESAREAN SECTION;  Surgeon: Paducah Bing, MD;  Location: [facility];  Service: Obstetrics;  Laterality: N/A;   COLONOSCOPY WITH PROPOFOL  10/07/2016   DILATION AND CURETTAGE OF UTERUS     ESOPHAGOGASTRODUODENOSCOPY  12/21/2015   EXCISION MASS UPPER EXTREMETIES Left 04/21/2017   Procedure: EXCISION MASS LEFT MIDDLE FINGER;  Surgeon: Cindee Salt, MD;  Location: [facility];  Service: Orthopedics;  Laterality: Left;   gastric sleeve  05/09/2022   IR FL  GUIDED LOC OF NEEDLE/CATH TIP FOR SPINAL INJECTION RT  03/13/2020   KNEE ARTHROSCOPY WITH LATERAL MENISECTOMY Left 10/31/2022   Procedure: KNEE ARTHROSCOPY WITH PARTIAL LATERAL MENISECTOMY;  Surgeon: Yolonda Kida, MD;  Location: [facility];  Service: Orthopedics;  Laterality: Left;  75   KNEE ARTHROSCOPY WITH LATERAL RELEASE Left 10/31/2022   Procedure: KNEE ARTHROSCOPY WITH LATERAL RELEASE AND LIMITED SYNOVECTOMY;  Surgeon: Yolonda Kida, MD;  Location: [facility];  Service: Orthopedics;  Laterality: Left;  75   PHOTOCOAGULATION WITH LASER Left 12/13/2020   Procedure: RETINOPLEXY LEFT EYE;  Surgeon: Stephannie Li, MD;  Location: [facility] OR;  Service: Ophthalmology;  Laterality: Left;   SCLERAL BUCKLE Right 12/13/2020   Procedure: SCLERAL BUCKLE WITH CRYO;  Surgeon: Stephannie Li, MD;   Location: [facility] OR;  Service: Ophthalmology;  Laterality: Right;    OB History     Gravida  4   Para  1   Term  1   Preterm  0   AB  2   Living  1      SAB  2   IAB  0   Ectopic  0   Multiple  0   Live Births  1            Home Medications    Prior to Admission medications   Medication Sig Start Date End Date Taking? Authorizing Provider  acetaminophen (TYLENOL) 500 MG tablet Take 2 tablets (1,000 mg total) by mouth every 6 (six) hours as needed for fever or headache. 10/01/23   Wyn Forster, MD  albuterol (VENTOLIN HFA) 108 (90 Base) MCG/ACT inhaler Inhale 1 puff into the lungs every 4 (four) hours as needed for shortness of breath. Patient not taking: Reported on 10/07/2023 09/28/20   [provider]  aspirin EC 81 MG tablet Take 2 tablets (162 mg total) by mouth daily. Swallow whole. 06/08/23   Reva Bores, MD  Blood Pressure Monitoring (BLOOD PRESSURE KIT) DEVI 1 Device by Does not apply route as needed. 04/30/23   Milas Hock, MD  cyclobenzaprine (FLEXERIL) 10 MG tablet Take 1 tablet (10 mg total) by mouth 3 (three) times daily as needed for muscle spasms. Patient not taking: Reported on 10/07/2023 06/05/23   Glyn Ade, Scot Jun, PA-C  famotidine (PEPCID) 40 MG tablet Take 0.5 tablets (20 mg total) by mouth 2 (two) times daily. Patient not taking: Reported on 10/07/2023 09/15/23   Federico Flake, MD  fluconazole (DIFLUCAN) 150 MG tablet Take 1 tablet (150 mg total) by mouth every 3 (three) days. 10/07/23   Bernerd Limbo, CNM  fluticasone (FLONASE) 50 MCG/ACT nasal spray Place 2 sprays into both nostrils daily. 11/12/22   Nafziger, Kandee Keen, NP  loratadine (CLARITIN) 10 MG tablet Take 10 mg by mouth daily as needed for allergies. Patient not taking: Reported on 10/07/2023    [provider]  Magnesium Oxide -Mg Supplement (MAG-OXIDE) 200 MG TABS Take 2 tablets (400 mg total) by mouth at bedtime. If that amount causes loose stools in  the am, switch to 200mg  daily at bedtime. 09/23/23   Bernerd Limbo, CNM  metoCLOPramide (REGLAN) 10 MG tablet Take 1 tablet (10 mg total) by mouth every 6 (six) hours. Patient not taking: Reported on 10/07/2023 05/15/23   Brand Males, CNM  Prenatal Vit-Fe Phos-FA-Omega (VITAFOL GUMMIES) 3.33-0.333-34.8 MG CHEW Chew 1 tablet by mouth daily. 04/08/23   Warden Fillers, MD  scopolamine (TRANSDERM-SCOP) 1 MG/3DAYS Place 1 patch (1.5 mg total)  onto the skin every 3 (three) days. Patient not taking: Reported on 10/07/2023 05/18/23   Brand Males, CNM  terconazole (TERAZOL 7) 0.4 % vaginal cream Place 1 applicator vaginally at bedtime for 7 days. 10/07/23 10/14/23  Bernerd Limbo, CNM  Vitamin D, Ergocalciferol, (DRISDOL) 1.25 MG (50000 UNIT) CAPS capsule Take 1 capsule (50,000 Units total) by mouth every 7 (seven) days. 09/29/23   Bernerd Limbo, CNM    Family History Family History  Problem Relation Age of Onset   Diabetes Maternal Aunt    Hypertension Maternal Uncle    Depression Maternal Grandfather    Diabetes Maternal Grandfather    Hypertension Maternal Grandfather    Arthritis Mother    High blood pressure Mother    Depression Mother    Sleep apnea Mother    Obesity Mother    Diabetes Mother    Hypertension Mother    Kidney failure Mother    Sleep apnea Father    Obesity Father    Healthy Daughter     Social History Social History   Tobacco Use   Smoking status: Never   Smokeless tobacco: Never  Vaping Use   Vaping status: Never Used  Substance Use Topics   Alcohol use: Not Currently    Comment: social    Drug use: No     Allergies   Bee venom, Hydrocodone-acetaminophen, Peach flavoring agent (non-screening), Pollen extract, Remdesivir, Latex, Cortisone, Cortizone-10 [hydrocortisone], and Toradol [ketorolac tromethamine]   Review of Systems Review of Systems   Physical Exam Triage Vital Signs ED Triage Vitals  Encounter Vitals Group      BP 10/13/23 1115 130/77     Systolic BP Percentile --      Diastolic BP Percentile --      Pulse Rate 10/13/23 1115 95     Resp 10/13/23 1115 17     Temp 10/13/23 1115 98.9 F (37.2 C)     Temp Source 10/13/23 1115 Oral     SpO2 10/13/23 1115 95 %     Weight --      Height --      Head Circumference --      Peak Flow --      Pain Score 10/13/23 1114 0     Pain Loc --      Pain Education --      Exclude from Growth Chart --    No data found.  Updated Vital Signs BP 130/77 (BP Location: Right Arm)   Pulse 95   Temp 98.9 F (37.2 C) (Oral)   Resp 17   LMP 03/07/2023 Comment: irreg bleeding, neg blood test 5/19  SpO2 95%   Visual Acuity Right Eye Distance:   Left Eye Distance:   Bilateral Distance:    Right Eye Near:   Left Eye Near:    Bilateral Near:     Physical Exam Vitals and nursing note reviewed.  Constitutional:      General: She is not in acute distress. Cardiovascular:     Rate and Rhythm: Normal rate and regular rhythm.     Pulses: Normal pulses.  Pulmonary:     Effort: Pulmonary effort is normal.  Musculoskeletal:        General: No swelling.     Cervical back: Normal range of motion.  Skin:    General: Skin is warm and dry.     Capillary Refill: Capillary refill takes less than 2 seconds.     Findings: Abrasion present.  Comments: Small 1 cm healing abrasion on right palm. Not bleeding  Neurological:     Mental Status: She is alert and oriented to person, place, and time.     UC Treatments / Results  Labs (all labs ordered are listed, but only abnormal results are displayed) Labs Reviewed - No data to display  EKG   Radiology No results found.  Procedures Procedures (including critical care time)  Medications Ordered in UC Medications - No data to display  Initial Impression / Assessment and Plan / UC Course  I have reviewed the triage vital signs and the nursing notes.  Pertinent labs & imaging results that were available  during my care of the patient were reviewed by me and considered in my medical decision making (see chart for details).  Tdap is UTD and she will likely get soon at prenatal visits  Discussed wound care at home, cleaning with soap and water, antibiotic ointment twice daily.  Advised signs of infection to monitor for.  Can return if needed.  Patient agreeable to plan, no questions  Final Clinical Impressions(s) / UC Diagnoses   Final diagnoses:  Abrasion of palm of right hand, initial encounter     Discharge Instructions      Continue tylenol for pain Apply antibiotic ointment twice daily Wash with soap and water (no more peroxide or alcohol)     ED Prescriptions   None    PDMP not reviewed this encounter.   Marlow Baars, [place] 10/13/23 1145

## 2023-10-13 NOTE — ED Triage Notes (Signed)
Pt presents with a c/o o the rt hand on index finger.    States she cut herself with a cart yesterday at [employer]. States she does not remember the last time she had a tetanus shot

## 2023-10-18 ENCOUNTER — Encounter (HOSPITAL_COMMUNITY): Payer: Self-pay | Admitting: Obstetrics & Gynecology

## 2023-10-18 ENCOUNTER — Inpatient Hospital Stay (HOSPITAL_COMMUNITY)
Admission: AD | Admit: 2023-10-18 | Discharge: 2023-10-18 | Disposition: A | Discharge status: Home / Self Care | Payer: Medicaid Other | Attending: Obstetrics & Gynecology | Admitting: Obstetrics & Gynecology

## 2023-10-18 ENCOUNTER — Inpatient Hospital Stay (HOSPITAL_COMMUNITY): Payer: Medicaid Other

## 2023-10-18 DIAGNOSIS — O99513 Diseases of the respiratory system complicating pregnancy, third trimester: Secondary | ICD-10-CM | POA: Diagnosis not present

## 2023-10-18 DIAGNOSIS — O99013 Anemia complicating pregnancy, third trimester: Secondary | ICD-10-CM

## 2023-10-18 DIAGNOSIS — O99213 Obesity complicating pregnancy, third trimester: Secondary | ICD-10-CM | POA: Diagnosis not present

## 2023-10-18 DIAGNOSIS — M549 Dorsalgia, unspecified: Secondary | ICD-10-CM | POA: Diagnosis not present

## 2023-10-18 DIAGNOSIS — Z3A31 31 weeks gestation of pregnancy: Secondary | ICD-10-CM

## 2023-10-18 DIAGNOSIS — O368130 Decreased fetal movements, third trimester, not applicable or unspecified: Secondary | ICD-10-CM | POA: Insufficient documentation

## 2023-10-18 DIAGNOSIS — D563 Thalassemia minor: Secondary | ICD-10-CM | POA: Diagnosis not present

## 2023-10-18 DIAGNOSIS — G932 Benign intracranial hypertension: Secondary | ICD-10-CM | POA: Insufficient documentation

## 2023-10-18 DIAGNOSIS — O34219 Maternal care for unspecified type scar from previous cesarean delivery: Secondary | ICD-10-CM | POA: Diagnosis not present

## 2023-10-18 DIAGNOSIS — D509 Iron deficiency anemia, unspecified: Secondary | ICD-10-CM | POA: Insufficient documentation

## 2023-10-18 DIAGNOSIS — R519 Headache, unspecified: Secondary | ICD-10-CM | POA: Diagnosis not present

## 2023-10-18 DIAGNOSIS — E669 Obesity, unspecified: Secondary | ICD-10-CM

## 2023-10-18 DIAGNOSIS — E282 Polycystic ovarian syndrome: Secondary | ICD-10-CM | POA: Diagnosis not present

## 2023-10-18 DIAGNOSIS — R1030 Lower abdominal pain, unspecified: Secondary | ICD-10-CM | POA: Diagnosis not present

## 2023-10-18 DIAGNOSIS — O99353 Diseases of the nervous system complicating pregnancy, third trimester: Secondary | ICD-10-CM | POA: Diagnosis not present

## 2023-10-18 DIAGNOSIS — O10913 Unspecified pre-existing hypertension complicating pregnancy, third trimester: Secondary | ICD-10-CM | POA: Diagnosis not present

## 2023-10-18 DIAGNOSIS — O99283 Endocrine, nutritional and metabolic diseases complicating pregnancy, third trimester: Secondary | ICD-10-CM | POA: Insufficient documentation

## 2023-10-18 DIAGNOSIS — E559 Vitamin D deficiency, unspecified: Secondary | ICD-10-CM | POA: Insufficient documentation

## 2023-10-18 DIAGNOSIS — W1830XA Fall on same level, unspecified, initial encounter: Secondary | ICD-10-CM

## 2023-10-18 DIAGNOSIS — O26893 Other specified pregnancy related conditions, third trimester: Secondary | ICD-10-CM | POA: Insufficient documentation

## 2023-10-18 DIAGNOSIS — O09293 Supervision of pregnancy with other poor reproductive or obstetric history, third trimester: Secondary | ICD-10-CM | POA: Insufficient documentation

## 2023-10-18 DIAGNOSIS — G4733 Obstructive sleep apnea (adult) (pediatric): Secondary | ICD-10-CM | POA: Insufficient documentation

## 2023-10-18 DIAGNOSIS — O9A213 Injury, poisoning and certain other consequences of external causes complicating pregnancy, third trimester: Secondary | ICD-10-CM | POA: Diagnosis not present

## 2023-10-18 DIAGNOSIS — W19XXXA Unspecified fall, initial encounter: Secondary | ICD-10-CM

## 2023-10-18 DIAGNOSIS — Y990 Civilian activity done for income or pay: Secondary | ICD-10-CM | POA: Diagnosis not present

## 2023-10-18 LAB — URINALYSIS, ROUTINE W REFLEX MICROSCOPIC
Bilirubin Urine: NEGATIVE
Glucose, UA: NEGATIVE mg/dL
Hgb urine dipstick: NEGATIVE
Ketones, ur: NEGATIVE mg/dL
Leukocytes,Ua: NEGATIVE
Nitrite: NEGATIVE
Protein, ur: NEGATIVE mg/dL
Specific Gravity, Urine: 1.018 (ref 1.005–1.030)
pH: 8 (ref 5.0–8.0)

## 2023-10-18 MED ORDER — CYCLOBENZAPRINE HCL 5 MG PO TABS: 10.0000 mg | ORAL_TABLET | Freq: Three times a day (TID) | ORAL | Status: DC | PRN

## 2023-10-18 MED ORDER — ACETAMINOPHEN 500 MG PO TABS: 1000.0000 mg | ORAL_TABLET | Freq: Once | ORAL | Status: DC

## 2023-10-18 NOTE — MAU Note (Signed)
Maureen Lewis is a 27 y.o. at [redacted]w[redacted]d here in MAU reporting: fell at work yesterday, "I guess my legs got weak".  Landed on her side.  Constant ache in lower back and lower abd with int sharp pains.  No bleeding or leaking, baby is not moving as much as usual.   Onset of complaint: yesterday Pain score: 7 There were no vitals filed for this visit.   [id number] to rm Lab orders placed from triage:  urine

## 2023-10-18 NOTE — MAU Provider Note (Cosign Needed)
Chief Complaint:  Decreased Fetal Movement, Abdominal Pain, Back Pain, and Fall  HPI  HPI: Maureen Lewis is a 27 y.o. [phone] at 64w0dwho presents to maternity admissions reporting a fall at work yesterday from standing. Reports "legs got weak" and landing on her side. 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 Denies LOC, palpitations, incontinence nor tremors. Presenting with constant lower back ache of 7/10. Sharp intermittent lower abdominal pains 7/10 with associated DFM. Denies head or neck injury. Denies abdominal trauma.   She reports good fetal movement, denies LOF, vaginal bleeding, vaginal itching/burning, urinary symptoms, h/a, dizziness, n/v, diarrhea, constipation or fever/chills.  She denies headache, visual changes or RUQ abdominal pain.  She is currently monitored at [facility] for high-risk pregnancy and has OSA (obstructive sleep apnea); PCOS (polycystic ovarian syndrome); Vitamin D deficiency; Alpha thalassemia trait; Supervision of high-risk pregnancy; History of gestational diabetes mellitus (GDM) in prior pregnancy, currently pregnant; Obesity in pregnancy; History of C-section; S/P laparoscopic sleeve gastrectomy; Idiopathic intracranial hypertension; Abnormal Pap smear of cervix; Headache in pregnancy; Chronic hypertension affecting pregnancy; and Iron deficiency anemia on their problem list.   Past Medical History: Past Medical History:  Diagnosis Date   Abnormal uterine bleeding    Acid reflux    Alpha thalassemia trait    Amenorrhea    Anemia    no current med.   Asthma    Back pain    Chest pain    resolved   Complication of anesthesia    states had to keep giving her anesthesia during EGD   Constipation    Depression    "I'm good"   Fibromyalgia    Finger mass, left 03/2017   middle finger   Gestational hypertension 09/23/2019    Guidelines for Antenatal Testing and Sonography  (with updated ICD-10 codes)  Updated  2019/09/19 with Dr. Noralee Space  INDICATION U/S 2 X week NST/AFI  or full BPP wkly DELIVERY Diabetes   A1 - good control - O24.410    A2 - good control - O24.419      A2  - poor control or poor compliance - O24.419, E11.65   (Macrosomia or polyhydramnios) **E11.65 is extra code for poor control**    A2/B - O24.   Headache    Hepatitis 07/18/2022   Hepatitis B   History of kidney stones    Hypertension    Irritable bowel syndrome (IBS)    Joint pain    Morbid obesity with body mass index (BMI) of 45.0 to 49.9 in adult [facility])    Plantar fasciitis, bilateral    resolved   Polycystic ovary syndrome    Pre-diabetes    no meds, diet controlled, diet not check blood sugar   Prediabetes 09/16/2017   Early 1hr 70     Shortness of breath    albuterol inhaler   Sleep apnea    no CPAP use   Vertigo 2017    Past obstetric history: OB History  Gravida Para Term Preterm AB Living  4 1 1  0 2 1  SAB IAB Ectopic Multiple Live Births  2 0 0 0 1    # Outcome Date GA Lbr Len/2nd Weight Sex Type Anes PTL Lv  4 Current           3 SAB 2023  Birth Comments: had D&C  2 SAB 2021          1 Term 10/09/19 [redacted]w[redacted]d  3370 g F CS-Vac EPI  LIV     Birth Comments: GHTN, GDM, IOL for GHTN, C/S for decels    Past Surgical History: Past Surgical History:  Procedure Laterality Date   CESAREAN SECTION N/A 10/09/2019   Procedure: CESAREAN SECTION;  Surgeon: [place] Bing, MD;  Location: [facility]S;  Service: Obstetrics;  Laterality: N/A;   COLONOSCOPY WITH PROPOFOL  10/07/2016   DILATION AND CURETTAGE OF UTERUS     ESOPHAGOGASTRODUODENOSCOPY  12/21/2015   EXCISION MASS UPPER EXTREMETIES Left 04/21/2017   Procedure: EXCISION MASS LEFT MIDDLE FINGER;  Surgeon: Cindee Salt, MD;  Location: [facility];  Service: Orthopedics;  Laterality: Left;   gastric sleeve  05/09/2022   IR FL GUIDED LOC OF  NEEDLE/CATH TIP FOR SPINAL INJECTION RT  03/13/2020   KNEE ARTHROSCOPY WITH LATERAL MENISECTOMY Left 10/31/2022   Procedure: KNEE ARTHROSCOPY WITH PARTIAL LATERAL MENISECTOMY;  Surgeon: Yolonda Kida, MD;  Location: [facility] ORS;  Service: Orthopedics;  Laterality: Left;  75   KNEE ARTHROSCOPY WITH LATERAL RELEASE Left 10/31/2022   Procedure: KNEE ARTHROSCOPY WITH LATERAL RELEASE AND LIMITED SYNOVECTOMY;  Surgeon: Yolonda Kida, MD;  Location: [facility] ORS;  Service: Orthopedics;  Laterality: Left;  75   PHOTOCOAGULATION WITH LASER Left 12/13/2020   Procedure: RETINOPLEXY LEFT EYE;  Surgeon: Stephannie Li, MD;  Location[facility] OR;  Service: Ophthalmology;  Laterality: Left;   SCLERAL BUCKLE Right 12/13/2020   Procedure: SCLERAL BUCKLE WITH CRYO;  Surgeon: Stephannie Li, MD;  Location: [facility] OR;  Service: Ophthalmology;  Laterality: Right;    Family History: Family History  Problem Relation Age of Onset   Diabetes Maternal Aunt    Hypertension Maternal Uncle    Depression Maternal Grandfather    Diabetes Maternal Grandfather    Hypertension Maternal Grandfather    Arthritis Mother    High blood pressure Mother    Depression Mother    Sleep apnea Mother    Obesity Mother    Diabetes Mother    Hypertension Mother    Kidney failure Mother    Sleep apnea Father    Obesity Father    Healthy Daughter     Social History: Social History   Tobacco Use   Smoking status: Never   Smokeless tobacco: Never  Vaping Use   Vaping status: Never Used  Substance Use Topics   Alcohol use: Not Currently    Comment: social    Drug use: No    Allergies:  Allergies  Allergen Reactions   Bee Venom Hives, Shortness Of Breath and Swelling   Hydrocodone-Acetaminophen Anaphylaxis    No problem when she takes Tylenol   Peach Flavoring Agent (Non-Screening) Hives, Shortness Of Breath and Other (See Comments)    SWELLING OF MOUTH   Pollen Extract Hives, Shortness Of Breath and Swelling    Remdesivir Swelling    Angioedema 8/25   Latex Rash   Cortisone Itching and Swelling   Cortizone-10 [Hydrocortisone]     Swelling and itching   Toradol [Ketorolac Tromethamine] Swelling    Meds:  Medications Prior to Admission  Medication Sig Dispense Refill Last Dose/Taking   acetaminophen (TYLENOL) 500 MG tablet Take 2 tablets (1,000 mg total) by mouth every 6 (six) hours as needed for fever or headache. 30 tablet 0    albuterol (VENTOLIN HFA) 108 (90 Base) MCG/ACT inhaler Inhale 1 puff  into the lungs every 4 (four) hours as needed for shortness of breath. (Patient not taking: Reported on 10/07/2023)      aspirin EC 81 MG tablet Take 2 tablets (162 mg total) by mouth daily. Swallow whole. 180 tablet 12    Blood Pressure Monitoring (BLOOD PRESSURE KIT) DEVI 1 Device by Does not apply route as needed. 1 each 0    cyclobenzaprine (FLEXERIL) 10 MG tablet Take 1 tablet (10 mg total) by mouth 3 (three) times daily as needed for muscle spasms. (Patient not taking: Reported on 10/07/2023) 40 tablet 2    famotidine (PEPCID) 40 MG tablet Take 0.5 tablets (20 mg total) by mouth 2 (two) times daily. (Patient not taking: Reported on 10/07/2023) 60 tablet 6    fluconazole (DIFLUCAN) 150 MG tablet Take 1 tablet (150 mg total) by mouth every 3 (three) days. 2 tablet 0    fluticasone (FLONASE) 50 MCG/ACT nasal spray Place 2 sprays into both nostrils daily. 16 g 6    loratadine (CLARITIN) 10 MG tablet Take 10 mg by mouth daily as needed for allergies. (Patient not taking: Reported on 10/07/2023)      Magnesium Oxide -Mg Supplement (MAG-OXIDE) 200 MG TABS Take 2 tablets (400 mg total) by mouth at bedtime. If that amount causes loose stools in the am, switch to 200mg  daily at bedtime. 60 tablet 3    metoCLOPramide (REGLAN) 10 MG tablet Take 1 tablet (10 mg total) by mouth every 6 (six) hours. (Patient not taking: Reported on 10/07/2023) 30 tablet 0    Prenatal Vit-Fe Phos-FA-Omega (VITAFOL GUMMIES)  3.33-0.333-34.8 MG CHEW Chew 1 tablet by mouth daily. 90 tablet 3    scopolamine (TRANSDERM-SCOP) 1 MG/3DAYS Place 1 patch (1.5 mg total) onto the skin every 3 (three) days. (Patient not taking: Reported on 10/07/2023) 10 patch 6    Vitamin D, Ergocalciferol, (DRISDOL) 1.25 MG (50000 UNIT) CAPS capsule Take 1 capsule (50,000 Units total) by mouth every 7 (seven) days. 10 capsule 0     I have reviewed patient's Past Medical Hx, Surgical Hx, Family Hx, Social Hx, medications and allergies.   ROS:  Review of Systems Other systems negative  Physical Exam  Patient Vitals for the past 24 hrs:  Height Weight  10/18/23 0804 6' 2.5" (1.892 m) (!) 174.1 kg   Constitutional: Well-developed, well-nourished female in no acute distress.  Cardiovascular: normal rate and rhythm Respiratory: normal effort, clear to auscultation bilaterally GI: Abd soft, non-tender, gravid appropriate for gestational age.   No rebound or guarding. MS: Extremities nontender, no edema, normal ROM Neurologic: Alert and oriented x 4.  GU: Neg CVAT.  FHT:  Baseline  145, moderate variability, accelerations present, no decelerations Contractions: none   Labs: Results for orders placed or performed during the hospital encounter of 10/18/23 (from the past 24 hours)  Urinalysis, Routine w reflex microscopic -Urine, Clean Catch     Status: None   Collection Time: 10/18/23  8:15 AM  Result Value Ref Range   Color, Urine YELLOW YELLOW   APPearance CLEAR CLEAR   Specific Gravity, Urine 1.018 1.005 - 1.030   pH 8.0 5.0 - 8.0   Glucose, UA NEGATIVE NEGATIVE mg/dL   Hgb urine dipstick NEGATIVE NEGATIVE   Bilirubin Urine NEGATIVE NEGATIVE   Ketones, ur NEGATIVE NEGATIVE mg/dL   Protein, ur NEGATIVE NEGATIVE mg/dL   Nitrite NEGATIVE NEGATIVE   Leukocytes,Ua NEGATIVE NEGATIVE   *Note: Due to a large number of results and/or encounters for the requested time  period, some results have not been displayed. A complete set of  results can be found in Results Review.   A/Positive/-- (07/18 1228)  Imaging:  [name] MFM OB FOLLOW UP Result Date: 09/28/2023 ----------------------------------------------------------------------  OBSTETRICS REPORT                       (Signed Final 09/28/2023 12:14 pm) ---------------------------------------------------------------------- Patient Info  ID #:       [id number]                          D.O.B.:  [birth year] (27 yrs)(F)  Name:       Tennis Ship RENEE              Visit Date: 09/28/2023 11:15 am              [place] ---------------------------------------------------------------------- Performed By  Attending:        Ma Rings MD         Ref. Address:     [address]                                                             [place], [place]                                                             [address]  Performed By:     Reinaldo Raddle            [facility]  Referred By:      [facility] ---------------------------------------------------------------------- Orders  #  Description                           Code        Ordered By  1  [name] MFM OB  FOLLOW UP                   16109.60    Noralee Space ----------------------------------------------------------------------  #  Order #                     Accession #                Episode #  1  [id number]                   [id number]                 [id number] ---------------------------------------------------------------------- Indications  Obesity complicating pregnancy, third          O99.213  trimester  Hypertension - Chronic/Pre-existing (no        O10.019  meds)  Poor obstetric history: Previous gestational   O09.299  diabetes  Genetic carrier (Alpha thal silent carrier)    Z14.8   Previous cesarean delivery, antepartum         O34.219  [redacted] weeks gestation of pregnancy                Z3A.28  LR NIPS - Female ---------------------------------------------------------------------- Fetal Evaluation  Num Of Fetuses:         1  Fetal Heart Rate(bpm):  143  Cardiac Activity:       Observed  Presentation:           Cephalic  Placenta:               Posterior  P. Cord Insertion:      Previously seen  Amniotic Fluid  AFI FV:      Within normal limits  AFI Sum(cm)     %Tile       Largest Pocket(cm)  20              80          6.94  RUQ(cm)       RLQ(cm)       LUQ(cm)        LLQ(cm)  6.76          6.94          3.64           2.66 ---------------------------------------------------------------------- Biometry  BPD:      75.1  mm     G. Age:  30w 1d         91  %    CI:        78.87   %    70 - 86                                                          FL/HC:      19.9   %    18.8 - 20.6  HC:      267.4  mm     G. Age:  29w 1d         50  %    HC/AC:      1.01        1.05 - 1.21  AC:      265.2  mm     G. Age:  30w 4d         96  %  FL/BPD:     71.0   %    71 - 87  FL:       53.3  mm     G. Age:  28w 2d         39  %    FL/AC:      20.1   %    20 - 24  LV:        3.5  mm  Est. FW:    1440  gm      3 lb 3 oz     91  % ---------------------------------------------------------------------- OB History  Gravidity:    4         Term:   1         SAB:   2  Living:       1 ---------------------------------------------------------------------- Gestational Age  LMP:           29w 2d        Date:  03/07/23                  EDD:   12/12/23  U/S Today:     29w 4d                                        EDD:   12/10/23  Best:          28w 1d     Det. By:  U/S C R L  (04/27/23)    EDD:   12/20/23 ---------------------------------------------------------------------- Targeted Anatomy  Central Nervous System  Calvarium/Cranial V.:  Appears normal         Cereb./Vermis:          Previously seen  Cavum:                 Appears  normal         Cisterna Magna:         Previously seen  Lateral Ventricles:    Appears normal         Midline Falx:           Previously seen  Choroid Plexus:        Previously seen  Spine  Cervical:              Previously seen        Sacral:                 Previously seen  Thoracic:              Previously seen        Shape/Curvature:        Previously seen  Lumbar:                Previously seen  Head/Neck  Lips:                  Previously seen        Profile:                Appears normal  Neck:                  Previously seen        Orbits/Eyes:            Previously seen  Nuchal Fold:           Previously seen  Mandible:               Previously seen  Nasal Bone:            Present                Maxilla:                Previously seen  Thorax  4 Chamber View:        Previously seen        Interventr. Septum:     Previously seen  Cardiac Rhythm:        Normal                 Cardiac Axis:           Previously een  Cardiac Situs:         Previously seen        Diaphragm:              Appears normal  Rt Outflow Tract:      Previously seen        3 Vessel View:          Previously seen  Lt Outflow Tract:      Previously seen        3 V Trachea View:       Previously seen  Aortic Arch:           Previously seen        IVC:                    Previously seen  Ductal Arch:           Previously seen        Crossing:               Previously seen  SVC:                   Previously seen  Abdomen  Ventral Wall:          Previously seen        Lt Kidney:              Appears normal  Cord Insertion:        Previously seen        Rt Kidney:              Appears normal  Situs:                 Previously seen        Bladder:                Appears normal  Stomach:               Appears normal  Extremities  Lt Humerus:            Previously seen        Lt Femur:               Previously seen  Rt Humerus:            Previously seen        Rt Femur:               Previously seen  Lt Forearm:            Previously seen         Lt Lower Leg:           Previously  seen  Rt Forearm:            Previously seen        Rt Lower Leg:           Previously seen  Lt Hand:               Visualized Prev        Lt Foot:                Foot visualized prev  Rt Hand:               Visualized Prev        Rt Foot:                Foot visualized prev  Other  Umbilical Cord:        Previously seen        Genitalia:              Female prev seen  Comment:     Technically difficult due to maternal habitus and fetal position. ---------------------------------------------------------------------- Cervix Uterus Adnexa  Cervix  Not visualized (advanced GA >24wks)  Uterus  No abnormality visualized.  Right Ovary  Not visualized.  Left Ovary  Not visualized.  Cul De Sac  No free fluid seen.  Adnexa  No adnexal mass visualized ---------------------------------------------------------------------- Comments  This patient was seen for a follow up growth scan due to  maternal obesity with a BMI of 48.  She denies any problems  since her last exam.  The results of her gestational diabetes  screening test is pending.  She was informed that the fetal growth and amniotic fluid  level appears appropriate for her gestational age.  Due to maternal obesity, we will start weekly fetal testing at  34 weeks.  She will return in 6 weeks for growth scan and BPP. ----------------------------------------------------------------------                   Ma Rings, MD Electronically Signed Final Report   09/28/2023 12:14 pm ----------------------------------------------------------------------     MAU Course/MDM: I have reviewed the triage vital signs and the nursing notes.   Pertinent labs & imaging results that were available during my care of the patient were reviewed by me and considered in my medical decision making (see chart for details).      I have reviewed her medical records including past results, notes and treatments.   I have ordered labs and reviewed results.   NST reviewed  Treatments in MAU included Prolonged monitoring, BPP, Tylenol, Flexeril.    Assessment: 1. [redacted] weeks gestation of pregnancy   2. Fall from standing, initial encounter   3. Decreased fetal movements in third trimester, single or unspecified fetus   Fall from standing without injury likely secondary to vasovagal event based on clinical symptoms, denies lightheadedness or weakness since. Low suspicion for seizure or cardiogenic syncope.  - Prolonged monitoring for 4 hours and BPP - 8/10, points lost for breathing movements which is age appropriate for 31 weeks. Overall reassuring  Back and abdominal pain  - Trial of tylenol and flexeril - improved.   Plan: Discharge home Labor precautions and fetal kick counts Follow up in Office for prenatal visits and recheck  Pt stable at time of discharge.  Wyn Forster, MD FMOB Fellow, Faculty practice [facility], [facility]  10/18/2023 9:51 AM

## 2023-10-21 NOTE — Progress Notes (Signed)
   PRENATAL VISIT NOTE  Subjective:  Maureen Lewis is a 28 y.o. H5E8978 at [redacted]w[redacted]d being seen today for ongoing prenatal care.  She is currently monitored for the following issues for this high-risk pregnancy and has OSA (obstructive sleep apnea); PCOS (polycystic ovarian syndrome); Vitamin D  deficiency; Alpha thalassemia trait; Supervision of high-risk pregnancy; History of gestational diabetes mellitus (GDM) in prior pregnancy, currently pregnant; Obesity in pregnancy; History of C-section; S/P laparoscopic sleeve gastrectomy; Idiopathic intracranial hypertension; Abnormal Pap smear of cervix; Headache in pregnancy; Chronic hypertension affecting pregnancy; and Iron  deficiency anemia on their problem list.  Patient reports  copious vaginal discharge and continued cramping, wants cervical exam today. Has a yeast infection but has not picked up treatment yet .  Contractions: Irritability. Vag. Bleeding: None.  Movement: Present. Denies leaking of fluid.   The following portions of the patient's history were reviewed and updated as appropriate: allergies, current medications, past family history, past medical history, past social history, past surgical history and problem list.   Objective:   Vitals:   10/22/23 1511  BP: 106/68  Pulse: (!) 108  Weight: (!) 386 lb 11.2 oz (175.4 kg)   Fetal Status: Fetal Heart Rate (bpm): 130 Fundal Height: 32 cm Movement: Present     General:  Alert, oriented and cooperative. Patient is in no acute distress.  Skin: Skin is warm and dry. No rash noted.   Cardiovascular: Normal heart rate noted  Respiratory: Normal respiratory effort, no problems with respiration noted  Abdomen: Soft, gravid, appropriate for gestational age.  Pain/Pressure: Present     Pelvic: Cervical exam performed in the presence of a chaperone Dilation: Closed Effacement (%): Thick Station: Ballotable  Extremities: Normal range of motion.  Edema: Mild pitting, slight indentation   Mental Status: Normal mood and affect. Normal behavior. Normal judgment and thought content.   Assessment and Plan:  Pregnancy: G4P1021 at [redacted]w[redacted]d 1. Supervision of high risk pregnancy in third trimester (Primary) - Doing well overall, feeling regular and vigorous fetal movement   2. [redacted] weeks gestation of pregnancy - Routine OB care  - Advised to pick up yeast treatment which will help her discharge and uterine irritability.  3. History of C-section - Denies repeat CS with BTS, per MFM recommendation ok to deliver between 38-39 weeks. CS posted for 12/08/23.  4. Chronic hypertension affecting pregnancy - Stable on aspirin  only  Preterm labor symptoms and general obstetric precautions including but not limited to vaginal bleeding, contractions, leaking of fluid and fetal movement were reviewed in detail with the patient. Please refer to After Visit Summary for other counseling recommendations.   Return in about 2 weeks (around 11/05/2023) for IN-PERSON, HOB.  Future Appointments  Date Time Provider Department Center  11/03/2023  8:20 AM Tobb, Kardie, DO [facility] None  11/04/2023  3:55 PM Vannie Cornell SAUNDERS, CNM [facility]  11/09/2023  2:15 PM [facility] NURSE [facility]  11/09/2023  2:30 PM [facility] US1 [facility]  11/16/2023  2:15 PM [facility] NURSE [facility]  11/16/2023  2:30 PM [facility] US3 [facility]  11/23/2023  2:15 PM [facility] NURSE [facility]  11/23/2023  2:30 PM [facility] US1 [facility]    Cornell SAUNDERS Vannie, CNM

## 2023-10-22 ENCOUNTER — Ambulatory Visit (INDEPENDENT_AMBULATORY_CARE_PROVIDER_SITE_OTHER): Payer: Medicaid Other | Admitting: Certified Nurse Midwife

## 2023-10-22 ENCOUNTER — Other Ambulatory Visit: Payer: Self-pay

## 2023-10-22 VITALS — BP 106/68 | HR 108 | Wt 386.7 lb

## 2023-10-22 DIAGNOSIS — O10013 Pre-existing essential hypertension complicating pregnancy, third trimester: Secondary | ICD-10-CM

## 2023-10-22 DIAGNOSIS — Z23 Encounter for immunization: Secondary | ICD-10-CM

## 2023-10-22 DIAGNOSIS — Z3A32 32 weeks gestation of pregnancy: Secondary | ICD-10-CM

## 2023-10-22 DIAGNOSIS — O10919 Unspecified pre-existing hypertension complicating pregnancy, unspecified trimester: Secondary | ICD-10-CM

## 2023-10-22 DIAGNOSIS — O0993 Supervision of high risk pregnancy, unspecified, third trimester: Secondary | ICD-10-CM

## 2023-10-22 DIAGNOSIS — O34219 Maternal care for unspecified type scar from previous cesarean delivery: Secondary | ICD-10-CM

## 2023-10-22 DIAGNOSIS — Z98891 History of uterine scar from previous surgery: Secondary | ICD-10-CM

## 2023-10-26 ENCOUNTER — Other Ambulatory Visit: Payer: Self-pay

## 2023-10-26 ENCOUNTER — Inpatient Hospital Stay (HOSPITAL_COMMUNITY)
Admission: AD | Admit: 2023-10-26 | Discharge: 2023-10-26 | Disposition: A | Discharge status: Home / Self Care | Payer: Medicaid Other | Attending: Obstetrics and Gynecology | Admitting: Obstetrics and Gynecology

## 2023-10-26 ENCOUNTER — Encounter (HOSPITAL_COMMUNITY): Payer: Self-pay | Admitting: Obstetrics and Gynecology

## 2023-10-26 DIAGNOSIS — Z3A32 32 weeks gestation of pregnancy: Secondary | ICD-10-CM | POA: Insufficient documentation

## 2023-10-26 DIAGNOSIS — O99210 Obesity complicating pregnancy, unspecified trimester: Secondary | ICD-10-CM

## 2023-10-26 DIAGNOSIS — O0993 Supervision of high risk pregnancy, unspecified, third trimester: Secondary | ICD-10-CM

## 2023-10-26 DIAGNOSIS — O4703 False labor before 37 completed weeks of gestation, third trimester: Secondary | ICD-10-CM | POA: Insufficient documentation

## 2023-10-26 DIAGNOSIS — O479 False labor, unspecified: Secondary | ICD-10-CM

## 2023-10-26 DIAGNOSIS — Z3493 Encounter for supervision of normal pregnancy, unspecified, third trimester: Secondary | ICD-10-CM

## 2023-10-26 DIAGNOSIS — Z98891 History of uterine scar from previous surgery: Secondary | ICD-10-CM

## 2023-10-26 DIAGNOSIS — O10919 Unspecified pre-existing hypertension complicating pregnancy, unspecified trimester: Secondary | ICD-10-CM

## 2023-10-26 DIAGNOSIS — O09299 Supervision of pregnancy with other poor reproductive or obstetric history, unspecified trimester: Secondary | ICD-10-CM

## 2023-10-26 LAB — URINALYSIS, ROUTINE W REFLEX MICROSCOPIC
Bilirubin Urine: NEGATIVE
Glucose, UA: NEGATIVE mg/dL
Hgb urine dipstick: NEGATIVE
Ketones, ur: NEGATIVE mg/dL
Leukocytes,Ua: NEGATIVE
Nitrite: NEGATIVE
Protein, ur: 30 mg/dL — AB
Specific Gravity, Urine: 1.032 — ABNORMAL HIGH (ref 1.005–1.030)
pH: 5 (ref 5.0–8.0)

## 2023-10-26 LAB — POCT FERN TEST: POCT Fern Test: NEGATIVE

## 2023-10-26 NOTE — MAU Note (Signed)
.  Maureen Lewis is a 28 y.o. at [redacted]w[redacted]d here in MAU reporting: about 45 min ago pt stated she was sitting braiding her daughter hair and when she got up her seat ws wet and her underwear was wet running down there leg. Denies any pain or pressure.  Stated fetal movement has been a little less this evening. No bleeding. Not wearing a pad now.   LMP:  Onset of complaint: 45 min Pain score: 0 Vitals:   10/26/23 2112  BP: 122/68  Pulse: (!) 109  Resp: 18  Temp: 98.1 F (36.7 C)     FHT:147 Lab orders placed from triage: u/a

## 2023-10-26 NOTE — MAU Provider Note (Addendum)
 History   Maureen Lewis , a  28 y.o. [phone] at [redacted]w[redacted]d presents to MAU for concerns of LOF that she reports occurred at ~8pm tonight. She denies VB, reports irregular contractions and rates them a 4/10 and states good FM. Patient denies noticing anymore LOF since that occurrence and denies noticing any while in triage. Patient denies any abnormal vaginal discharge or odor.     RN note: Maureen Lewis is a 28 y.o. at [redacted]w[redacted]d here in MAU reporting: about 45 min ago pt stated she was sitting braiding her daughter hair and when she got up her seat ws wet and her underwear was wet running down there leg. Denies any pain or pressure.  Stated fetal movement has been a little less this evening. No bleeding. Not wearing a pad now.    LMP:  Onset of complaint: 45 min Pain score: 0  CSN: [id number]  Arrival date and time: 10/26/23 2054   Event Date/Time   First Provider Initiated Contact with Patient 10/26/23 2209      Chief Complaint  Patient presents with   Vaginal Discharge   Rupture of Membranes   Vaginal Discharge The patient's pertinent negatives include no vaginal discharge. Associated symptoms include abdominal pain. Pertinent negatives include no dysuria, nausea or vomiting.    OB History     Gravida  4   Para  1   Term  1   Preterm  0   AB  2   Living  1      SAB  2   IAB  0   Ectopic  0   Multiple  0   Live Births  1           Past Medical History:  Diagnosis Date   Abnormal uterine bleeding    Acid reflux    Alpha thalassemia trait    Amenorrhea    Anemia    no current med.   Asthma    Back pain    Chest pain    resolved   Complication of anesthesia    states had to keep giving her anesthesia during EGD   Constipation    Depression    I'm good   Fibromyalgia    Finger mass, left 03/2017   middle finger   Gestational hypertension 09/23/2019   Guidelines for Antenatal Testing and Sonography  (with updated ICD-10  codes)  Updated  2019/10/01 with Dr. Fredia Fresh  INDICATION U/S 2 X week NST/AFI  or full BPP wkly DELIVERY Diabetes   A1 - good control - O24.410    A2 - good control - O24.419      A2  - poor control or poor compliance - O24.419, E11.65   (Macrosomia or polyhydramnios) **E11.65 is extra code for poor control**    A2/B - O24.   Headache    Hepatitis 07/18/2022   Hepatitis B   History of kidney stones    Hypertension    Irritable bowel syndrome (IBS)    Joint pain    Morbid obesity with body mass index (BMI) of 45.0 to 49.9 in adult [facility])    Plantar fasciitis, bilateral    resolved   Polycystic ovary syndrome    Pre-diabetes    no meds, diet controlled, diet not check blood sugar   Prediabetes 09/16/2017   Early 1hr 70     Shortness of breath    albuterol  inhaler   Sleep apnea    no CPAP use  Vertigo 2017    Past Surgical History:  Procedure Laterality Date   CESAREAN SECTION N/A 10/09/2019   Procedure: CESAREAN SECTION;  Surgeon: Izell Harari, MD;  [facility]: [facility];  Service: Obstetrics;  Laterality: N/A;   COLONOSCOPY WITH PROPOFOL   10/07/2016   DILATION AND CURETTAGE OF UTERUS     ESOPHAGOGASTRODUODENOSCOPY  12/21/2015   EXCISION MASS UPPER EXTREMETIES Left 04/21/2017   Procedure: EXCISION MASS LEFT MIDDLE FINGER;  Surgeon: Murrell Kuba, MD;  Location: [facility];  Service: Orthopedics;  Laterality: Left;   gastric sleeve  05/09/2022   IR FL GUIDED LOC OF NEEDLE/CATH TIP FOR SPINAL INJECTION RT  03/13/2020   KNEE ARTHROSCOPY WITH LATERAL MENISECTOMY Left 10/31/2022   Procedure: KNEE ARTHROSCOPY WITH PARTIAL LATERAL MENISECTOMY;  Surgeon: Sharl Selinda Dover, MD;  Location: [facility];  Service: Orthopedics;  Laterality: Left;  77   KNEE ARTHROSCOPY WITH LATERAL RELEASE Left 10/31/2022   Procedure: KNEE ARTHROSCOPY WITH LATERAL RELEASE AND LIMITED SYNOVECTOMY;  Surgeon: Sharl Selinda Dover, MD;  Location: [facility];  Service: Orthopedics;  Laterality: Left;   77   PHOTOCOAGULATION WITH LASER Left 12/13/2020   Procedure: RETINOPLEXY LEFT EYE;  Surgeon: Jarold Selinda, MD;  Location[facility] OR;  Service: Ophthalmology;  Laterality: Left;   SCLERAL BUCKLE Right 12/13/2020   Procedure: SCLERAL BUCKLE WITH CRYO;  Surgeon: Jarold Selinda, MD;  Location: [facility] OR;  Service: Ophthalmology;  Laterality: Right;    Family History  Problem Relation Age of Onset   Diabetes Maternal Aunt    Hypertension Maternal Uncle    Depression Maternal Grandfather    Diabetes Maternal Grandfather    Hypertension Maternal Grandfather    Arthritis Mother    High blood pressure Mother    Depression Mother    Sleep apnea Mother    Obesity Mother    Diabetes Mother    Hypertension Mother    Kidney failure Mother    Sleep apnea Father    Obesity Father    Healthy Daughter     Social History   Tobacco Use   Smoking status: Never   Smokeless tobacco: Never  Vaping Use   Vaping status: Never Used  Substance Use Topics   Alcohol use: Not Currently    Comment: social    Drug use: No    Allergies:  Allergies  Allergen Reactions   Bee Venom Hives, Shortness Of Breath and Swelling   Hydrocodone -Acetaminophen  Anaphylaxis    No problem when she takes Tylenol    Peach Flavoring Agent (Non-Screening) Hives, Shortness Of Breath and Other (See Comments)    SWELLING OF MOUTH   Pollen Extract Hives, Shortness Of Breath and Swelling   Remdesivir  Swelling    Angioedema 8/25   Latex Rash   Cortisone Itching and Swelling   Cortizone-10 [Hydrocortisone ]     Swelling and itching   Toradol  [Ketorolac  Tromethamine ] Swelling    Medications Prior to Admission  Medication Sig Dispense Refill Last Dose/Taking   acetaminophen  (TYLENOL ) 500 MG tablet Take 2 tablets (1,000 mg total) by mouth every 6 (six) hours as needed for fever or headache. 30 tablet 0 Past Month   aspirin  EC 81 MG tablet Take 2 tablets (162 mg total) by mouth daily. Swallow whole. 180 tablet 12 10/26/2023  Morning   fluconazole  (DIFLUCAN ) 150 MG tablet Take 1 tablet (150 mg total) by mouth every 3 (three) days. 2 tablet 0 10/25/2023 Morning   fluticasone  (FLONASE ) 50 MCG/ACT nasal spray Place 2 sprays into both nostrils daily.  16 g 6 Past Month   Magnesium  Oxide -Mg Supplement (MAG-OXIDE) 200 MG TABS Take 2 tablets (400 mg total) by mouth at bedtime. If that amount causes loose stools in the am, switch to 200mg  daily at bedtime. 60 tablet 3 10/26/2023 Morning   Prenatal Vit-Fe Phos-FA-Omega (VITAFOL  GUMMIES) 3.33-0.333-34.8 MG CHEW Chew 1 tablet by mouth daily. 90 tablet 3 10/26/2023 Morning   Vitamin D , Ergocalciferol , (DRISDOL ) 1.25 MG (50000 UNIT) CAPS capsule Take 1 capsule (50,000 Units total) by mouth every 7 (seven) days. 10 capsule 0 10/26/2023 Morning   albuterol  (VENTOLIN  HFA) 108 (90 Base) MCG/ACT inhaler Inhale 1 puff into the lungs every 4 (four) hours as needed for shortness of breath. (Patient not taking: Reported on 10/22/2023)      Blood Pressure Monitoring (BLOOD PRESSURE KIT) DEVI 1 Device by Does not apply route as needed. 1 each 0    cyclobenzaprine  (FLEXERIL ) 10 MG tablet Take 1 tablet (10 mg total) by mouth 3 (three) times daily as needed for muscle spasms. (Patient not taking: Reported on 09/02/2023) 40 tablet 2    famotidine  (PEPCID ) 40 MG tablet Take 0.5 tablets (20 mg total) by mouth 2 (two) times daily. (Patient not taking: Reported on 10/22/2023) 60 tablet 6    loratadine  (CLARITIN ) 10 MG tablet Take 10 mg by mouth daily as needed for allergies. (Patient not taking: Reported on 10/07/2023)       Review of Systems  Constitutional: Negative.   Gastrointestinal:  Positive for abdominal pain. Negative for nausea and vomiting.  Genitourinary:  Negative for dysuria, vaginal bleeding and vaginal discharge.   Physical Exam   Blood pressure (!) 101/52, pulse 93, temperature 98.1 F (36.7 C), resp. rate 16, height 6' 2.5 (1.892 m), weight (!) 174.6 kg, last menstrual period 03/07/2023,  SpO2 97%.  Physical Exam Exam conducted with a chaperone present.  Constitutional:      General: She is not in acute distress.    Appearance: She is not ill-appearing.  Cardiovascular:     Rate and Rhythm: Normal rate.  Pulmonary:     Effort: Pulmonary effort is normal.  Genitourinary:    General: Normal vulva.     Exam position: Lithotomy position.     Comments: Unable to visualize cervix. Normal vaginal discharge. No pooling. No vaginal bleeding. Skin:    General: Skin is warm and dry.  Neurological:     Mental Status: She is alert and oriented to person, place, and time.  Psychiatric:        Mood and Affect: Mood normal.        Behavior: Behavior normal.     MAU Course  Procedures Orders Placed This Encounter  Procedures   Urinalysis, Routine w reflex microscopic -Urine, Clean Catch   Contraction - monitoring   External fetal heart monitoring   POCT fern test    Results for orders placed or performed during the hospital encounter of 10/26/23 (from the past 24 hours)  Urinalysis, Routine w reflex microscopic -Urine, Clean Catch     Status: Abnormal   Collection Time: 10/26/23  9:20 PM  Result Value Ref Range   Color, Urine AMBER (A) YELLOW   APPearance HAZY (A) CLEAR   Specific Gravity, Urine 1.032 (H) 1.005 - 1.030   pH 5.0 5.0 - 8.0   Glucose, UA NEGATIVE NEGATIVE mg/dL   Hgb urine dipstick NEGATIVE NEGATIVE   Bilirubin Urine NEGATIVE NEGATIVE   Ketones, ur NEGATIVE NEGATIVE mg/dL   Protein, ur 30 (A) NEGATIVE  mg/dL   Nitrite NEGATIVE NEGATIVE   Leukocytes,Ua NEGATIVE NEGATIVE   RBC / HPF 0-5 0 - 5 RBC/hpf   WBC, UA 0-5 0 - 5 WBC/hpf   Bacteria, UA RARE (A) NONE SEEN   Squamous Epithelial / HPF 0-5 0 - 5 /HPF   Mucus PRESENT   POCT fern test     Status: Normal   Collection Time: 10/26/23  9:46 PM  Result Value Ref Range   POCT Fern Test Negative = intact amniotic membranes    *Note: Due to a large number of results and/or encounters for the requested  time period, some results have not been displayed. A complete set of results can be found in Results Review.    Mode: EFM FHR: 140s Variability: Moderate Accels: present Decels: None Scalp Stim: Contractions: irritability   Duration:   Quality:   Resting tone & time:     MDM PPROM Vaginal discharge, in pregnancy -SVE: negative for pooling. No abnormal discharge or VB noted. Low suspicion for PPROM or vaginal infection at this time  Discharge home.   Assessment and Plan  False Labor -Preterm labor symptoms and general obstetric precautions including but not limited to vaginal bleeding, contractions, leaking of fluid and fetal movement were reviewed in detail with the patient. -Advised patient to increase fluid intake  Discharge home in stable condition.   Ethyl Vila K Dessiree Sze, SNM 10/26/2023, 10:25 PM

## 2023-11-02 DIAGNOSIS — F439 Reaction to severe stress, unspecified: Secondary | ICD-10-CM | POA: Diagnosis not present

## 2023-11-03 ENCOUNTER — Ambulatory Visit: Payer: Medicaid Other | Attending: Cardiology | Admitting: Cardiology

## 2023-11-03 NOTE — Progress Notes (Signed)
   PRENATAL VISIT NOTE  Subjective:  Maureen Lewis is a 28 y.o. [id number] at [redacted]w[redacted]d being seen today for ongoing prenatal care.  She is currently monitored for the following issues for this high-risk pregnancy and has OSA (obstructive sleep apnea); PCOS (polycystic ovarian syndrome); Vitamin D  deficiency; Alpha thalassemia trait; Supervision of high-risk pregnancy; History of gestational diabetes mellitus (GDM) in prior pregnancy, currently pregnant; Obesity in pregnancy; History of C-section; S/P laparoscopic sleeve gastrectomy; Idiopathic intracranial hypertension; Abnormal Pap smear of cervix; Headache in pregnancy; Chronic hypertension affecting pregnancy; and Iron  deficiency anemia on their problem list.  Patient reports  having spells of feeling palpitations with chest pain, SOB and dizzy feeling. Was supposed to see Dr. Emmette Harms but did not go to the appointment, was also supposed to get a Zio patch but did not receive it or notify the office that it was never received. Feeling ok otherwise, plenty of fetal movement .  Contractions: Irritability. Vag. Bleeding: Scant (Old blood when wipe).  Movement: Present. Denies leaking of fluid.   The following portions of the patient's history were reviewed and updated as appropriate: allergies, current medications, past family history, past medical history, past social history, past surgical history and problem list.   Objective:   Vitals:   11/04/23 1630  BP: 119/87  Pulse: (!) 101  Weight: (!) 389 lb (176.4 kg)   Fetal Status: Fetal Heart Rate (bpm): 145   Movement: Present     General:  Alert, oriented and cooperative. Patient is in no acute distress.  Skin: Skin is warm and dry. No rash noted.   Cardiovascular: Normal heart rate noted  Respiratory: Normal respiratory effort, no problems with respiration noted  Abdomen: Soft, gravid, appropriate for gestational age.  Pain/Pressure: Absent     Pelvic: Cervical exam deferred         Extremities: Normal range of motion.  Edema: Mild pitting, slight indentation  Mental Status: Normal mood and affect. Normal behavior. Normal judgment and thought content.   Assessment and Plan:  Pregnancy: [id number] at [redacted]w[redacted]d 1. Supervision of high risk pregnancy in third trimester (Primary) - Doing well, feeling regular and vigorous fetal movement   2. [redacted] weeks gestation of pregnancy - Routine OB care   3. Chronic hypertension affecting pregnancy - Stable on aspirin , has not been taking meds since BP has been so low.  4. Palpitations - Advised to please go back to cardio clinic given new symptoms, also advised to seek eval at MAU if this happens again. - Will call cardio clinic to see if they can get her in soon  Preterm labor symptoms and general obstetric precautions including but not limited to vaginal bleeding, contractions, leaking of fluid and fetal movement were reviewed in detail with the patient. Please refer to After Visit Summary for other counseling recommendations.   Return in about 2 weeks (around 11/18/2023) for IN-PERSON, HOB.  Future Appointments  Date Time Provider Department Center  11/06/2023  3:20 PM Tobb, Kardie, DO [facility] None  11/09/2023  2:15 PM [facility] NURSE [facility]  11/09/2023  2:30 PM [facility] US1 [facility]  11/16/2023  2:15 PM [facility] NURSE [facility]  11/16/2023  2:30 PM [facility] US3 [facility]  11/18/2023  3:15 PM Cleophas Dadds [facility]  11/23/2023  2:15 PM [facility] NURSE [facility]  11/23/2023  2:30 PM [facility] US1 [facility]    Derick Fleeting, CNM

## 2023-11-04 ENCOUNTER — Ambulatory Visit: Payer: Medicaid Other | Admitting: Certified Nurse Midwife

## 2023-11-04 ENCOUNTER — Other Ambulatory Visit: Payer: Self-pay

## 2023-11-04 VITALS — BP 119/87 | HR 101 | Wt 389.0 lb

## 2023-11-04 DIAGNOSIS — O10913 Unspecified pre-existing hypertension complicating pregnancy, third trimester: Secondary | ICD-10-CM

## 2023-11-04 DIAGNOSIS — R002 Palpitations: Secondary | ICD-10-CM | POA: Diagnosis not present

## 2023-11-04 DIAGNOSIS — Z3A33 33 weeks gestation of pregnancy: Secondary | ICD-10-CM | POA: Diagnosis not present

## 2023-11-04 DIAGNOSIS — Z2911 Encounter for prophylactic immunotherapy for respiratory syncytial virus (RSV): Secondary | ICD-10-CM

## 2023-11-04 DIAGNOSIS — Z3A36 36 weeks gestation of pregnancy: Secondary | ICD-10-CM

## 2023-11-04 DIAGNOSIS — O0993 Supervision of high risk pregnancy, unspecified, third trimester: Secondary | ICD-10-CM | POA: Diagnosis not present

## 2023-11-04 DIAGNOSIS — O10919 Unspecified pre-existing hypertension complicating pregnancy, unspecified trimester: Secondary | ICD-10-CM

## 2023-11-05 ENCOUNTER — Encounter (HOSPITAL_COMMUNITY): Payer: Self-pay | Admitting: Obstetrics and Gynecology

## 2023-11-05 ENCOUNTER — Inpatient Hospital Stay (HOSPITAL_COMMUNITY)
Admission: AD | Admit: 2023-11-05 | Discharge: 2023-11-05 | Disposition: A | Discharge status: Home / Self Care | Payer: Medicaid Other | Attending: Obstetrics and Gynecology | Admitting: Obstetrics and Gynecology

## 2023-11-05 DIAGNOSIS — R0789 Other chest pain: Secondary | ICD-10-CM | POA: Insufficient documentation

## 2023-11-05 DIAGNOSIS — M940 Chondrocostal junction syndrome [Tietze]: Secondary | ICD-10-CM

## 2023-11-05 DIAGNOSIS — R42 Dizziness and giddiness: Secondary | ICD-10-CM | POA: Diagnosis not present

## 2023-11-05 DIAGNOSIS — O26893 Other specified pregnancy related conditions, third trimester: Secondary | ICD-10-CM

## 2023-11-05 DIAGNOSIS — Z3A33 33 weeks gestation of pregnancy: Secondary | ICD-10-CM | POA: Diagnosis not present

## 2023-11-05 DIAGNOSIS — R0602 Shortness of breath: Secondary | ICD-10-CM | POA: Insufficient documentation

## 2023-11-05 LAB — URINALYSIS, ROUTINE W REFLEX MICROSCOPIC
Bilirubin Urine: NEGATIVE
Glucose, UA: NEGATIVE mg/dL
Hgb urine dipstick: NEGATIVE
Ketones, ur: NEGATIVE mg/dL
Leukocytes,Ua: NEGATIVE
Nitrite: NEGATIVE
Protein, ur: NEGATIVE mg/dL
Specific Gravity, Urine: 1.025 (ref 1.005–1.030)
pH: 6 (ref 5.0–8.0)

## 2023-11-05 MED ORDER — LIDOCAINE 5 % EX PTCH: 1.0000 | MEDICATED_PATCH | CUTANEOUS | 0 refills | Status: DC

## 2023-11-05 MED ORDER — LIDOCAINE 5 % EX PTCH
1.0000 | MEDICATED_PATCH | CUTANEOUS | Status: DC
  Administered 2023-11-05: 1 via TRANSDERMAL
  Filled 2023-11-05: qty 1

## 2023-11-05 NOTE — MAU Note (Signed)
.  Maureen Lewis is a 28 y.o. at [redacted]w[redacted]d here in MAU reporting: she was at work and started feeling tightness in her chest. Took B/P and it was 88/48 and pulse 135. Still feels chest discomfort and lightheaded now V/SS.  LMP:  Onset of complaint: 7am Pain score: 10 Vitals:   11/05/23 1217  BP: 125/70  Pulse: 99  Resp: 18  Temp: 98.2 F (36.8 C)     FHT:147 Lab orders placed from triage: EKG

## 2023-11-05 NOTE — MAU Provider Note (Signed)
History     CSN: [id number]  Arrival date and time: 11/05/23 1152   None     Chief Complaint  Patient presents with   Chest Pain   Dizziness   HPI Maureen Lewis is a 28 yo [id number] @[redacted]w[redacted]d  presenting for chest tightness with shortness of breath.  Reports that she took her blood pressure and it was low.  Reporting that she has had pain in her chest right above her sternum which has been a chronic issue.  Also reports associated lightheadedness.  Reports she drinks "a lot" of water.  Denies any injury to her chest.  Reports that the reason she becomes short of breath is because she takes short breaths so that her chest does not move is much which is more comfortable for her.  OB History     Gravida  4   Para  1   Term  1   Preterm  0   AB  2   Living  1      SAB  2   IAB  0   Ectopic  0   Multiple  0   Live Births  1           Past Medical History:  Diagnosis Date   Abnormal uterine bleeding    Acid reflux    Alpha thalassemia trait    Amenorrhea    Anemia    no current med.   Asthma    Back pain    Chest pain    resolved   Complication of anesthesia    states had to keep giving her anesthesia during EGD   Constipation    Depression    "I'm good"   Fibromyalgia    Finger mass, left 03/2017   middle finger   Gestational hypertension 09/23/2019   Guidelines for Antenatal Testing and Sonography  (with updated ICD-10 codes)  Updated  2019/10/07 with Dr. Noralee Space  INDICATION U/S 2 X week NST/AFI  or full BPP wkly DELIVERY Diabetes   A1 - good control - O24.410    A2 - good control - O24.419      A2  - poor control or poor compliance - O24.419, E11.65   (Macrosomia or polyhydramnios) **E11.65 is extra code for poor control**    A2/B - O24.   Headache    Hepatitis 07/18/2022   Hepatitis B   History of kidney stones    Hypertension    Irritable bowel syndrome (IBS)    Joint pain    Morbid obesity with body mass index (BMI) of 45.0 to  49.9 in adult [facility])    Plantar fasciitis, bilateral    resolved   Polycystic ovary syndrome    Pre-diabetes    no meds, diet controlled, diet not check blood sugar   Prediabetes 09/16/2017   Early 1hr 70     Shortness of breath    albuterol inhaler   Sleep apnea    no CPAP use   Vertigo 2017    Past Surgical History:  Procedure Laterality Date   CESAREAN SECTION N/A 10/09/2019   Procedure: CESAREAN SECTION;  Surgeon: [place]ing Bing, MD;  [facility]: [facility];  Service: Obstetrics;  Laterality: N/A;   COLONOSCOPY WITH PROPOFOL  10/07/2016   DILATION AND CURETTAGE OF UTERUS     ESOPHAGOGASTRODUODENOSCOPY  12/21/2015   EXCISION MASS UPPER EXTREMETIES Left 04/21/2017   Procedure: EXCISION MASS LEFT MIDDLE FINGER;  Surgeon: Cindee Salt, MD;  Location: [facility];  Service: Orthopedics;  Laterality: Left;   gastric sleeve  05/09/2022   IR FL GUIDED LOC OF NEEDLE/CATH TIP FOR SPINAL INJECTION RT  03/13/2020   KNEE ARTHROSCOPY WITH LATERAL MENISECTOMY Left 10/31/2022   Procedure: KNEE ARTHROSCOPY WITH PARTIAL LATERAL MENISECTOMY;  Surgeon: Yolonda Kida, MD;  [facility]: [facility];  Service: Orthopedics;  Laterality: Left;  77   KNEE ARTHROSCOPY WITH LATERAL RELEASE Left 10/31/2022   Procedure: KNEE ARTHROSCOPY WITH LATERAL RELEASE AND LIMITED SYNOVECTOMY;  Surgeon: Yolonda Kida, MD;  Location: [facility];  Service: Orthopedics;  Laterality: Left;  77   PHOTOCOAGULATION WITH LASER Left 12/13/2020   Procedure: RETINOPLEXY LEFT EYE;  Surgeon: Stephannie Li, MD;  Location: [facility] OR;  Service: Ophthalmology;  Laterality: Left;   SCLERAL BUCKLE Right 12/13/2020   Procedure: SCLERAL BUCKLE WITH CRYO;  Surgeon: Stephannie Li, MD;  Location: [facility] OR;  Service: Ophthalmology;  Laterality: Right;    Family History  Problem Relation Age of Onset   Diabetes Maternal Aunt    Hypertension Maternal Uncle    Depression Maternal Grandfather    Diabetes Maternal Grandfather     Hypertension Maternal Grandfather    Arthritis Mother    High blood pressure Mother    Depression Mother    Sleep apnea Mother    Obesity Mother    Diabetes Mother    Hypertension Mother    Kidney failure Mother    Sleep apnea Father    Obesity Father    Healthy Daughter     Social History   Tobacco Use   Smoking status: Never   Smokeless tobacco: Never  Vaping Use   Vaping status: Never Used  Substance Use Topics   Alcohol use: Not Currently    Comment: social    Drug use: No    Allergies:  Allergies  Allergen Reactions   Bee Venom Hives, Shortness Of Breath and Swelling   Hydrocodone-Acetaminophen Anaphylaxis    No problem when she takes Tylenol   Peach Flavoring Agent (Non-Screening) Hives, Shortness Of Breath and Other (See Comments)    SWELLING OF MOUTH   Pollen Extract Hives, Shortness Of Breath and Swelling   Remdesivir Swelling    Angioedema 8/25   Latex Rash   Cortisone Itching and Swelling   Cortizone-10 [Hydrocortisone]     Swelling and itching   Toradol [Ketorolac Tromethamine] Swelling    Medications Prior to Admission  Medication Sig Dispense Refill Last Dose/Taking   acetaminophen (TYLENOL) 500 MG tablet Take 2 tablets (1,000 mg total) by mouth every 6 (six) hours as needed for fever or headache. 30 tablet 0 11/05/2023 at  7:00 AM   albuterol (VENTOLIN HFA) 108 (90 Base) MCG/ACT inhaler Inhale 1 puff into the lungs every 4 (four) hours as needed for shortness of breath.   Past Month   aspirin EC 81 MG tablet Take 2 tablets (162 mg total) by mouth daily. Swallow whole. 180 tablet 12 11/05/2023 at  4:00 AM   Magnesium Oxide -Mg Supplement (MAG-OXIDE) 200 MG TABS Take 2 tablets (400 mg total) by mouth at bedtime. If that amount causes loose stools in the am, switch to 200mg  daily at bedtime. 60 tablet 3 11/04/2023   Prenatal Vit-Fe Phos-FA-Omega (VITAFOL GUMMIES) 3.33-0.333-34.8 MG CHEW Chew 1 tablet by mouth daily. 90 tablet 3 11/05/2023   Vitamin D,  Ergocalciferol, (DRISDOL) 1.25 MG (50000 UNIT) CAPS capsule Take 1 capsule (50,000 Units total) by mouth every 7 (seven) days. 10 capsule 0 Past Week  Blood Pressure Monitoring (BLOOD PRESSURE KIT) DEVI 1 Device by Does not apply route as needed. 1 each 0    fluticasone (FLONASE) 50 MCG/ACT nasal spray Place 2 sprays into both nostrils daily. 16 g 6 Unknown    Review of Systems  Constitutional:  Negative for chills and fever.  HENT:  Negative for congestion and rhinorrhea.   Respiratory:  Positive for shortness of breath.   Cardiovascular:  Positive for chest pain.  Gastrointestinal:  Negative for abdominal pain, constipation, diarrhea and vomiting.  Genitourinary:  Negative for vaginal bleeding and vaginal discharge.  Neurological:  Positive for dizziness, light-headedness and headaches.   Physical Exam   Blood pressure (!) 113/57, pulse 95, temperature 98.2 F (36.8 C), resp. rate 18, height 6' 2.5" (1.892 m), weight (!) 176 kg, last menstrual period 03/07/2023.  Physical Exam Vitals reviewed.  Constitutional:      Appearance: She is well-developed.  Eyes:     Pupils: Pupils are equal, round, and reactive to light.  Cardiovascular:     Rate and Rhythm: Normal rate and regular rhythm.     Heart sounds: Normal heart sounds. No murmur heard. Pulmonary:     Effort: Pulmonary effort is normal. No respiratory distress.     Breath sounds: Normal breath sounds.  Chest:     Chest wall: Tenderness (Along the left side of the sternum) present.  Abdominal:     Palpations: Abdomen is soft.  Musculoskeletal:        General: Normal range of motion.     Cervical back: Normal range of motion.  Skin:    General: Skin is warm.     Capillary Refill: Capillary refill takes less than 2 seconds.  Neurological:     General: No focal deficit present.     Mental Status: She is alert.  Psychiatric:        Mood and Affect: Mood normal.    MAU Course  Procedures  MDM EKG Physical  exam NST   Assessment and Plan  Maureen Lewis is a 28 yo [id number] @[redacted]w[redacted]d  presenting for chest pain.  Chest pain Patient reports chronic issue with chest pain, shortness of breath, dizziness when she stands.  Feel like the dizziness when she stands is orthostasis in combination with her low normal blood pressures.  Her chest pain is reproducible with palpation leaning towards costochondritis.  Applied Lidoderm patch with improvement in the chest pain.  Discussed use of Tylenol arthritic pain.  Do not feel that it is cardiac in origin given EKG is normal and she has tenderness to palpation in the location where she feels the pain.  She also reports that her shortness of breath is because she does not want to move her chest much to decrease the pain.  No signs of a DVT.  She is not tachycardic or tachypneic on evaluation.  Discussed strict return precautions.  Patient discharged home.   Maureen Lewis 11/05/2023, 2:04 PM

## 2023-11-05 NOTE — Progress Notes (Signed)
Webb Silversmith MAU tech at bedside to complete EKG

## 2023-11-06 ENCOUNTER — Ambulatory Visit (INDEPENDENT_AMBULATORY_CARE_PROVIDER_SITE_OTHER): Payer: Medicaid Other | Admitting: Cardiology

## 2023-11-06 ENCOUNTER — Encounter: Payer: Self-pay | Admitting: Cardiology

## 2023-11-06 ENCOUNTER — Other Ambulatory Visit: Payer: Medicaid Other

## 2023-11-06 ENCOUNTER — Ambulatory Visit: Payer: Medicaid Other

## 2023-11-06 VITALS — BP 104/70 | HR 107 | Ht 74.0 in | Wt 390.2 lb

## 2023-11-06 DIAGNOSIS — R55 Syncope and collapse: Secondary | ICD-10-CM | POA: Diagnosis not present

## 2023-11-06 DIAGNOSIS — Z79899 Other long term (current) drug therapy: Secondary | ICD-10-CM

## 2023-11-06 DIAGNOSIS — R002 Palpitations: Secondary | ICD-10-CM

## 2023-11-06 DIAGNOSIS — R42 Dizziness and giddiness: Secondary | ICD-10-CM | POA: Diagnosis not present

## 2023-11-06 NOTE — Progress Notes (Unsigned)
Requested to room per Dr. Servando Salina, pt reporting decreased fetal movement today. Has been having mild contractions all day - as she was in MAU yesterday, they have not increased in intensity or frequency.  FHR: 140, listened for and heard a 15x >15s acceleration up to 156. Fetal movement palpated and felt by patient.  Advised that if contractions or DFM continue, she should return to MAU for evaluation. Pt reticent to go but agreed if movement slow this evening.  Edd Arbour, CNM, MSN, IBCLC Certified Nurse Midwife, [facility]

## 2023-11-06 NOTE — Progress Notes (Unsigned)
Cardio-Obstetrics Clinic  Follow Up Note   Date:  11/08/2023   ID:  Maureen Lewis, DOB September 01, 1996, MRN [id number]  PCP:  Shirline Frees, NP   [facility] Providers Cardiologist:  Thomasene Ripple, DO  Electrophysiologist:  None        Referring MD: Shirline Frees, NP   Chief Complaint: " I am doing ok"  History of Present Illness:    Maureen Lewis is a 28 y.o. female [G4P1021] who returns for follow up of chronic hypertension in pregnancy.   She reports a recent episode of lightheadedness and dizziness associated with low blood pressure and high pulse rate. The patient was evaluated in the hospital for these symptoms, but no significant findings were noted on EKG. She has been experiencing these symptoms for approximately 33 weeks.  She has been maintaining hydration with significant water intake and adding IV fluid packets for additional hydration. Despite this, she continues to feel as though she may be dehydrated.She is taking prenatal vitamins and aspirin, and checking her blood pressure daily at home. She also reports taking a probiotic.  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 She has a scheduled C-section in February.  She did not wear the previus zio    Prior CV Studies Reviewed: The following studies were reviewed today: Echo   Past Medical History:  Diagnosis Date   Abnormal uterine bleeding    Acid reflux    Alpha thalassemia trait    Amenorrhea    Anemia    no current med.   Asthma    Back pain    Chest pain    resolved   Complication of anesthesia    states had to keep giving her anesthesia during EGD   Constipation    Depression    "I'm good"   Fibromyalgia    Finger mass, left 03/2017   middle finger   Gestational hypertension 09/23/2019   Guidelines for Antenatal Testing and Sonography  (with updated ICD-10 codes)  Updated   10/07/2019 with Dr. Noralee Space  INDICATION U/S 2 X week NST/AFI  or full BPP wkly DELIVERY Diabetes   A1 - good control - O24.410    A2 - good control - O24.419      A2  - poor control or poor compliance - O24.419, E11.65   (Macrosomia or polyhydramnios) **E11.65 is extra code for poor control**    A2/B - O24.   Headache    Hepatitis 07/18/2022   Hepatitis B   History of kidney stones    Hypertension    Irritable bowel syndrome (IBS)    Joint pain    Morbid obesity with body mass index (BMI) of 45.0 to 49.9 in adult [facility])    Plantar fasciitis, bilateral    resolved   Polycystic ovary syndrome    Pre-diabetes    no meds, diet controlled, diet not check blood sugar   Prediabetes 09/16/2017   Early 1hr 70     Shortness of breath    albuterol inhaler   Sleep apnea    no CPAP use   Vertigo 2017    Past Surgical History:  Procedure Laterality Date   CESAREAN SECTION N/A 10/09/2019   Procedure: CESAREAN SECTION;  Surgeon: Benson Bing, MD;  Location: [facility] ORS;  Service: Obstetrics;  Laterality: N/A;   COLONOSCOPY WITH PROPOFOL  10/07/2016  DILATION AND CURETTAGE OF UTERUS     ESOPHAGOGASTRODUODENOSCOPY  12/21/2015   EXCISION MASS UPPER EXTREMETIES Left 04/21/2017   Procedure: EXCISION MASS LEFT MIDDLE FINGER;  Surgeon: Cindee Salt, MD;  Location: [facility];  Service: Orthopedics;  Laterality: Left;   gastric sleeve  05/09/2022   IR FL GUIDED LOC OF NEEDLE/CATH TIP FOR SPINAL INJECTION RT  03/13/2020   KNEE ARTHROSCOPY WITH LATERAL MENISECTOMY Left 10/31/2022   Procedure: KNEE ARTHROSCOPY WITH PARTIAL LATERAL MENISECTOMY;  Surgeon: Yolonda Kida, MD;  Location: [facility] ORS;  Service: Orthopedics;  Laterality: Left;  75   KNEE ARTHROSCOPY WITH LATERAL RELEASE Left 10/31/2022   Procedure: KNEE ARTHROSCOPY WITH LATERAL RELEASE AND LIMITED SYNOVECTOMY;  Surgeon: Yolonda Kida, MD;  Location: [facility] ORS;  Service: Orthopedics;  Laterality: Left;  75    PHOTOCOAGULATION WITH LASER Left 12/13/2020   Procedure: RETINOPLEXY LEFT EYE;  Surgeon: Stephannie Li, MD;  Location: [facility] OR;  Service: Ophthalmology;  Laterality: Left;   SCLERAL BUCKLE Right 12/13/2020   Procedure: SCLERAL BUCKLE WITH CRYO;  Surgeon: Stephannie Li, MD;  Location: [facility];  Service: Ophthalmology;  Laterality: Right;      OB History     Gravida  4   Para  1   Term  1   Preterm  0   AB  2   Living  1      SAB  2   IAB  0   Ectopic  0   Multiple  0   Live Births  1               Current Medications: Current Meds  Medication Sig   acetaminophen (TYLENOL) 500 MG tablet Take 2 tablets (1,000 mg total) by mouth every 6 (six) hours as needed for fever or headache.   albuterol (VENTOLIN HFA) 108 (90 Base) MCG/ACT inhaler Inhale 1 puff into the lungs every 4 (four) hours as needed for shortness of breath.   aspirin EC 81 MG tablet Take 2 tablets (162 mg total) by mouth daily. Swallow whole.   Blood Pressure Monitoring (BLOOD PRESSURE KIT) DEVI 1 Device by Does not apply route as needed.   fluticasone (FLONASE) 50 MCG/ACT nasal spray Place 2 sprays into both nostrils daily.   lidocaine (LIDODERM) 5 % Place 1 patch onto the skin daily. Remove & Discard patch within 12 hours or as directed by MD   Magnesium Oxide -Mg Supplement (MAG-OXIDE) 200 MG TABS Take 2 tablets (400 mg total) by mouth at bedtime. If that amount causes loose stools in the am, switch to 200mg  daily at bedtime.   Prenatal Vit-Fe Phos-FA-Omega (VITAFOL GUMMIES) 3.33-0.333-34.8 MG CHEW Chew 1 tablet by mouth daily.   Vitamin D, Ergocalciferol, (DRISDOL) 1.25 MG (50000 UNIT) CAPS capsule Take 1 capsule (50,000 Units total) by mouth every 7 (seven) days.     Allergies:   Bee venom, Hydrocodone-acetaminophen, Peach flavoring agent (non-screening), Pollen extract, Remdesivir, Latex, Cortisone, Cortizone-10 [hydrocortisone], and Toradol [ketorolac tromethamine]   Social History   Socioeconomic  History   Marital status: Single    Spouse name: Not on file   Number of children: Not on file   Years of education: Not on file   Highest education level: Not on file  Occupational History   Occupation: IT sales professional    Employer: [employer]  Tobacco Use   Smoking status: Never   Smokeless tobacco: Never  Vaping Use   Vaping status: Never Used  Substance and Sexual Activity  Alcohol use: Not Currently    Comment: social    Drug use: No   Sexual activity: Not Currently    Birth control/protection: None  Other Topics Concern   Not on file  Social History Narrative   Right handed    Soda sometimes   Lives with mom and cousin, a grandmother in [place] also helps care for her.       She is in nursing school.    Social Drivers of Health   Financial Resource Strain: High Risk (11/05/2022)   Received from [facility], [facility]   Overall Financial Resource Strain (CARDIA)    Difficulty of Paying Living Expenses: Hard  Food Insecurity: No Food Insecurity (05/07/2023)   Hunger Vital Sign    Worried About Running Out of Food in the Last Year: Never true    Ran Out of Food in the Last Year: Never true  Transportation Needs: No Transportation Needs (05/07/2023)   PRAPARE - Administrator, Civil Service (Medical): No    Lack of Transportation (Non-Medical): No  Physical Activity: Unknown (11/05/2022)   Received from [facility]   Exercise Vital Sign    Days of Exercise per Week: 0 days    Minutes of Exercise per Session: Not on file  Recent Concern: Physical Activity - Inactive (11/05/2022)   Received from [facility], [facility]   Exercise Vital Sign    Days of Exercise per Week: 0 days    Minutes of Exercise per Session: 40 min  Stress: Stress Concern Present (11/05/2022)   Received from [facility], [facility] of Occupational Health - Occupational Stress Questionnaire    Feeling of Stress : Rather much  Social Connections:  Somewhat Isolated (11/05/2022)   Received from [facility], [facility]   Social Network    How would you rate your social network (family, work, friends)?: Restricted participation with some degree of social isolation      Family History  Problem Relation Age of Onset   Diabetes Maternal Aunt    Hypertension Maternal Uncle    Depression Maternal Grandfather    Diabetes Maternal Grandfather    Hypertension Maternal Grandfather    Arthritis Mother    High blood pressure Mother    Depression Mother    Sleep apnea Mother    Obesity Mother    Diabetes Mother    Hypertension Mother    Kidney failure Mother    Sleep apnea Father    Obesity Father    Healthy Daughter       ROS:   Please see the history of present illness.     All other systems reviewed and are negative.   Labs/EKG Reviewed:    EKG:   EKG was not ordered today.    Recent Labs: 05/07/2023: TSH 0.661 06/01/2023: Magnesium 1.8 08/03/2023: ALT 13; BUN 5; Creatinine, Ser 0.36; Potassium 4.2; Sodium 136 09/28/2023: Hemoglobin 11.7; Platelets 220   Recent Lipid Panel Lab Results  Component Value Date/Time   CHOL 118 11/08/2020 11:31 AM   CHOL 156 04/09/2020 12:52 PM   TRIG 64.0 11/08/2020 11:31 AM   HDL 36.00 (L) 11/08/2020 11:31 AM   HDL 42 04/09/2020 12:52 PM   CHOLHDL 3 11/08/2020 11:31 AM   LDLCALC 69 11/08/2020 11:31 AM   LDLCALC 101 (H) 04/09/2020 12:52 PM    Physical Exam:    VS:  BP 104/70 (BP Location: Left Arm, Patient Position: Sitting, Cuff Size: Normal)  Pulse (!) 107   Ht 6\' 2"  (1.88 m)   Wt (!) 390 lb 3.2 oz (177 kg)   LMP 03/07/2023 Comment: irreg bleeding, neg blood test 5/19  SpO2 93%   BMI 50.10 kg/m     Wt Readings from Last 3 Encounters:  11/06/23 (!) 390 lb 3.2 oz (177 kg)  11/05/23 (!) 388 lb (176 kg)  11/04/23 (!) 389 lb (176.4 kg)     GEN:  Well nourished, well developed in no acute distress HEENT: Normal NECK: No JVD; No carotid bruits LYMPHATICS: No  lymphadenopathy CARDIAC: RRR, no murmurs, rubs, gallops RESPIRATORY:  Clear to auscultation without rales, wheezing or rhonchi  ABDOMEN: Soft, non-tender, non-distended MUSCULOSKELETAL:  No edema; No deformity  SKIN: Warm and dry NEUROLOGIC:  Alert and oriented x 3 PSYCHIATRIC:  Normal affect    Risk Assessment/Risk Calculators:     CARPREG II Risk Prediction Index Score:  1.  The patient's risk for a primary cardiac event is 5%.   Modified World Health Organization [facility]) Classification of Maternal CV Risk   Class I         ASSESSMENT & PLAN:    Palpitations and Lightheadedness Patient reports episodes of lightheadedness and palpitations. No syncope. Recent episode of lightheadedness resulted in a fall. EKG at the time was unremarkable. Patient has not been wearing the prescribed monitor due to delivery issues. -Apply heart monitor today in office. -Check blood work today if lab is open.  Pregnancy (33 weeks) Patient is pregnant and due for C-section on February 18th. No significant swelling noted. Patient is well-hydrated and maintaining a low-salt diet. -Continue prenatal care and aspirin. -Plan to stop work on January 31st to rest before C-section. -Follow-up in 3-4 weeks.  She reported less movement of the baby encouraged to go the the MAU -later at the end of the visit. Unplanned assessment by one of the midwife Edd Arbour. CNM) - separate note.   Patient Instructions  Medication Instructions:  Your physician recommends that you continue on your current medications as directed. Please refer to the Current Medication list given to you today.  *If you need a refill on your cardiac medications before your next appointment, please call your pharmacy*  Testing: ZIO XT- Long Term Monitor Instructions  Your physician has requested you wear a ZIO patch monitor for 7 days.  This is a single patch monitor. [employer] supplies one patch monitor per enrollment.  Additional stickers are not available. Please do not apply patch if you will be having a Nuclear Stress Test,  Echocardiogram, Cardiac CT, MRI, or Chest Xray during the period you would be wearing the  monitor. The patch cannot be worn during these tests. You cannot remove and re-apply the  ZIO XT patch monitor.  Your ZIO patch monitor will be mailed 3 day USPS to your address on file. It may take 3-5 days  to receive your monitor after you have been enrolled.  Once you have received your monitor, please review the enclosed instructions. Your monitor  has already been registered assigning a specific monitor serial # to you.  Billing and Patient Assistance Program Information  We have supplied [employer] with any of your insurance information on file for billing purposes. [employer] offers a sliding scale Patient Assistance Program for patients that do not have  insurance, or whose insurance does not completely cover the cost of the ZIO monitor.  You must apply for the Patient Assistance Program to qualify for this discounted rate.  To apply, please call [employer] at [phone], select option 4, select option 2, ask to apply for  Patient Assistance Program. Meredeth Ide will ask your household income, and how many people  are in your household. They will quote your out-of-pocket cost based on that information.  [employer] will also be able to set up a 59-month, interest-free payment plan if needed.  Applying the monitor   Shave hair from upper left chest.  Hold abrader disc by orange tab. Rub abrader in 40 strokes over the upper left chest as  indicated in your monitor instructions.  Clean area with 4 enclosed alcohol pads. Let dry.  Apply patch as indicated in monitor instructions. Patch will be placed under collarbone on left  side of chest with arrow pointing upward.  Rub patch adhesive wings for 2 minutes. Remove white label marked "1". Remove the white  label marked "2". Rub patch adhesive wings  for 2 additional minutes.  While looking in a mirror, press and release button in center of patch. A small green light will  flash 3-4 times. This will be your only indicator that the monitor has been turned on.  Do not shower for the first 24 hours. You may shower after the first 24 hours.  Press the button if you feel a symptom. You will hear a small click. Record Date, Time and  Symptom in the Patient Logbook.  When you are ready to remove the patch, follow instructions on the last 2 pages of Patient  Logbook. Stick patch monitor onto the last page of Patient Logbook.  Place Patient Logbook in the blue and white box. Use locking tab on box and tape box closed  securely. The blue and white box has prepaid postage on it. Please place it in the mailbox as  soon as possible. Your physician should have your test results approximately 7 days after the  monitor has been mailed back to [facility].  Call [facility] Customer Care at [phone] if you have questions regarding  your ZIO XT patch monitor. Call them immediately if you see an orange light blinking on your  monitor.  If your monitor falls off in less than 4 days, contact our Monitor department at [phone].  If your monitor becomes loose or falls off after 4 days call [employer] at [phone] for  suggestions on securing your monitor   Lab Work: CMET, Mag If you have labs (blood work) drawn today and your tests are completely normal, you will receive your results only by: MyChart Message (if you have MyChart) OR A paper copy in the mail If you have any lab test that is abnormal or we need to change your treatment, we will call you to review the results.  Follow-Up: At [facility], you and your health needs are our priority.  As part of our continuing mission to provide you with exceptional heart care, we have created designated Provider Care Teams.  These Care Teams include your primary Cardiologist  (physician) and Advanced Practice Providers (APPs -  Physician Assistants and Nurse Practitioners) who all work together to provide you with the care you need, when you need it.  Your next appointment:   3 week(s)  Provider:   Thomasene Ripple, DO [address], [place], [place] [address]  Other Instructions:      Dispo:  No follow-ups on file.   Medication Adjustments/Labs and Tests Ordered: Current medicines are reviewed at length with the patient today.  Concerns regarding medicines are outlined  above.  Tests Ordered: Orders Placed This Encounter  Procedures   Comp Met (CMET)   Magnesium   LONG TERM MONITOR (3-14 DAYS)   Medication Changes: No orders of the defined types were placed in this encounter.

## 2023-11-06 NOTE — Patient Instructions (Addendum)
Medication Instructions:  Your physician recommends that you continue on your current medications as directed. Please refer to the Current Medication list given to you today.  *If you need a refill on your cardiac medications before your next appointment, please call your pharmacy*  Testing: ZIO XT- Long Term Monitor Instructions  Your physician has requested you wear a ZIO patch monitor for 7 days.  This is a single patch monitor. [employer] supplies one patch monitor per enrollment. Additional stickers are not available. Please do not apply patch if you will be having a Nuclear Stress Test,  Echocardiogram, Cardiac CT, MRI, or Chest Xray during the period you would be wearing the  monitor. The patch cannot be worn during these tests. You cannot remove and re-apply the  ZIO XT patch monitor.  Your ZIO patch monitor will be mailed 3 day USPS to your address on file. It may take 3-5 days  to receive your monitor after you have been enrolled.  Once you have received your monitor, please review the enclosed instructions. Your monitor  has already been registered assigning a specific monitor serial # to you.  Billing and Patient Assistance Program Information  We have supplied [employer] with any of your insurance information on file for billing purposes. [employer] offers a sliding scale Patient Assistance Program for patients that do not have  insurance, or whose insurance does not completely cover the cost of the ZIO monitor.  You must apply for the Patient Assistance Program to qualify for this discounted rate.  To apply, please call [employer] at [phone], select option 4, select option 2, ask to apply for  Patient Assistance Program. Meredeth Ide will ask your household income, and how many people  are in your household. They will quote your out-of-pocket cost based on that information.  [employer] will also be able to set up a 16-month, interest-free payment plan if needed.  Applying the monitor    Shave hair from upper left chest.  Hold abrader disc by orange tab. Rub abrader in 40 strokes over the upper left chest as  indicated in your monitor instructions.  Clean area with 4 enclosed alcohol pads. Let dry.  Apply patch as indicated in monitor instructions. Patch will be placed under collarbone on left  side of chest with arrow pointing upward.  Rub patch adhesive wings for 2 minutes. Remove white label marked "1". Remove the white  label marked "2". Rub patch adhesive wings for 2 additional minutes.  While looking in a mirror, press and release button in center of patch. A small green light will  flash 3-4 times. This will be your only indicator that the monitor has been turned on.  Do not shower for the first 24 hours. You may shower after the first 24 hours.  Press the button if you feel a symptom. You will hear a small click. Record Date, Time and  Symptom in the Patient Logbook.  When you are ready to remove the patch, follow instructions on the last 2 pages of Patient  Logbook. Stick patch monitor onto the last page of Patient Logbook.  Place Patient Logbook in the blue and white box. Use locking tab on box and tape box closed  securely. The blue and white box has prepaid postage on it. Please place it in the mailbox as  soon as possible. Your physician should have your test results approximately 7 days after the  monitor has been mailed back to [facility].  Call [facility] Customer Care at  [phone] if you have questions regarding  your ZIO XT patch monitor. Call them immediately if you see an orange light blinking on your  monitor.  If your monitor falls off in less than 4 days, contact our Monitor department at [phone].  If your monitor becomes loose or falls off after 4 days call [employer] at [phone] for  suggestions on securing your monitor   Lab Work: CMET, Mag If you have labs (blood work) drawn today and your tests are completely normal, you  will receive your results only by: MyChart Message (if you have MyChart) OR A paper copy in the mail If you have any lab test that is abnormal or we need to change your treatment, we will call you to review the results.  Follow-Up: At [facility], you and your health needs are our priority.  As part of our continuing mission to provide you with exceptional heart care, we have created designated Provider Care Teams.  These Care Teams include your primary Cardiologist (physician) and Advanced Practice Providers (APPs -  Physician Assistants and Nurse Practitioners) who all work together to provide you with the care you need, when you need it.  Your next appointment:   3 week(s)  Provider:   Thomasene Ripple, DO [address], [place], [place] [address]  Other Instructions:

## 2023-11-09 ENCOUNTER — Ambulatory Visit: Payer: Medicaid Other | Attending: Obstetrics and Gynecology | Admitting: *Deleted

## 2023-11-09 ENCOUNTER — Ambulatory Visit: Payer: Medicaid Other

## 2023-11-09 ENCOUNTER — Encounter: Payer: Self-pay | Admitting: *Deleted

## 2023-11-09 ENCOUNTER — Other Ambulatory Visit: Payer: Self-pay

## 2023-11-09 VITALS — BP 119/66 | HR 98

## 2023-11-09 DIAGNOSIS — O0993 Supervision of high risk pregnancy, unspecified, third trimester: Secondary | ICD-10-CM

## 2023-11-09 DIAGNOSIS — O99013 Anemia complicating pregnancy, third trimester: Secondary | ICD-10-CM

## 2023-11-09 DIAGNOSIS — D563 Thalassemia minor: Secondary | ICD-10-CM

## 2023-11-09 DIAGNOSIS — Z3A34 34 weeks gestation of pregnancy: Secondary | ICD-10-CM

## 2023-11-09 DIAGNOSIS — O3663X0 Maternal care for excessive fetal growth, third trimester, not applicable or unspecified: Secondary | ICD-10-CM | POA: Diagnosis not present

## 2023-11-09 DIAGNOSIS — O10013 Pre-existing essential hypertension complicating pregnancy, third trimester: Secondary | ICD-10-CM | POA: Diagnosis not present

## 2023-11-09 DIAGNOSIS — O99213 Obesity complicating pregnancy, third trimester: Secondary | ICD-10-CM | POA: Insufficient documentation

## 2023-11-09 DIAGNOSIS — O99843 Bariatric surgery status complicating pregnancy, third trimester: Secondary | ICD-10-CM | POA: Diagnosis not present

## 2023-11-09 DIAGNOSIS — O99353 Diseases of the nervous system complicating pregnancy, third trimester: Secondary | ICD-10-CM

## 2023-11-09 DIAGNOSIS — O34219 Maternal care for unspecified type scar from previous cesarean delivery: Secondary | ICD-10-CM

## 2023-11-09 DIAGNOSIS — G932 Benign intracranial hypertension: Secondary | ICD-10-CM | POA: Diagnosis not present

## 2023-11-09 DIAGNOSIS — Z98891 History of uterine scar from previous surgery: Secondary | ICD-10-CM

## 2023-11-09 DIAGNOSIS — O09293 Supervision of pregnancy with other poor reproductive or obstetric history, third trimester: Secondary | ICD-10-CM

## 2023-11-09 DIAGNOSIS — E669 Obesity, unspecified: Secondary | ICD-10-CM

## 2023-11-09 DIAGNOSIS — O10919 Unspecified pre-existing hypertension complicating pregnancy, unspecified trimester: Secondary | ICD-10-CM

## 2023-11-09 DIAGNOSIS — O09299 Supervision of pregnancy with other poor reproductive or obstetric history, unspecified trimester: Secondary | ICD-10-CM

## 2023-11-09 DIAGNOSIS — O99210 Obesity complicating pregnancy, unspecified trimester: Secondary | ICD-10-CM

## 2023-11-09 DIAGNOSIS — O10913 Unspecified pre-existing hypertension complicating pregnancy, third trimester: Secondary | ICD-10-CM | POA: Insufficient documentation

## 2023-11-10 ENCOUNTER — Other Ambulatory Visit: Payer: Self-pay | Admitting: *Deleted

## 2023-11-10 DIAGNOSIS — O99213 Obesity complicating pregnancy, third trimester: Secondary | ICD-10-CM

## 2023-11-11 ENCOUNTER — Other Ambulatory Visit: Payer: Self-pay | Admitting: Obstetrics and Gynecology

## 2023-11-11 DIAGNOSIS — Z98891 History of uterine scar from previous surgery: Secondary | ICD-10-CM | POA: Insufficient documentation

## 2023-11-11 DIAGNOSIS — O0993 Supervision of high risk pregnancy, unspecified, third trimester: Secondary | ICD-10-CM

## 2023-11-11 MED ORDER — CEFAZOLIN SODIUM-DEXTROSE 3-4 GM/150ML-% IV SOLN: 3.0000 g | INTRAVENOUS | Status: DC

## 2023-11-11 MED ORDER — SOD CITRATE-CITRIC ACID 500-334 MG/5ML PO SOLN: 30.0000 mL | ORAL | Status: DC

## 2023-11-11 NOTE — Progress Notes (Signed)
CS orders entered

## 2023-11-12 NOTE — Addendum Note (Signed)
Addended by: Reynolds Bowl on: 11/12/2023 08:48 AM   Modules accepted: Orders

## 2023-11-15 NOTE — Progress Notes (Signed)
   PRENATAL VISIT NOTE  Subjective:  Maureen Lewis is a 28 y.o. G9F6213 at [redacted]w[redacted]d being seen today for ongoing prenatal care.  She is currently monitored for the following issues for this high-risk pregnancy and has OSA (obstructive sleep apnea); PCOS (polycystic ovarian syndrome); Vitamin D deficiency; Alpha thalassemia trait; Supervision of high-risk pregnancy; History of gestational diabetes mellitus (GDM) in prior pregnancy, currently pregnant; Obesity in pregnancy; S/P laparoscopic sleeve gastrectomy; Idiopathic intracranial hypertension; Abnormal Pap smear of cervix; Headache in pregnancy; Chronic hypertension affecting pregnancy; Iron deficiency anemia; and History of cesarean section on their problem list.  Patient reports no complaints.  Contractions: Irregular. Vag. Bleeding: None.  Movement: Present. Denies leaking of fluid.   The following portions of the patient's history were reviewed and updated as appropriate: allergies, current medications, past family history, past medical history, past social history, past surgical history and problem list.   Objective:   Vitals:   11/18/23 1552  BP: 110/61  Pulse: (!) 109  Weight: (!) 392 lb 9.6 oz (178.1 kg)    Fetal Status: Fetal Heart Rate (bpm): 160   Movement: Present     General:  Alert, oriented and cooperative. Patient is in no acute distress.  Skin: Skin is warm and dry. No rash noted.   Cardiovascular: Normal heart rate noted  Respiratory: Normal respiratory effort, no problems with respiration noted  Abdomen: Soft, gravid, appropriate for gestational age.  Pain/Pressure: Absent     Pelvic: Cervical exam deferred        Extremities: Normal range of motion.  Edema: None  Mental Status: Normal mood and affect. Normal behavior. Normal judgment and thought content.   Assessment and Plan:  Pregnancy: G4P1021 at [redacted]w[redacted]d 1. Supervision of high risk pregnancy in third trimester (Primary) - Doing well, feeling regular  and vigorous fetal movement   2. [redacted] weeks gestation of pregnancy - Routine OB care   3. History of cesarean section - Planning TOLAC with BTS (10/07/23)  4. Chronic hypertension affecting pregnancy - Stable on aspirin   Preterm labor symptoms and general obstetric precautions including but not limited to vaginal bleeding, contractions, leaking of fluid and fetal movement were reviewed in detail with the patient. Please refer to After Visit Summary for other counseling recommendations.   Return in about 1 week (around 11/25/2023) for IN-PERSON, HOB.  Future Appointments  Date Time Provider Department Center  11/23/2023  2:15 PM [facility] NURSE [facility]  11/23/2023  2:30 PM [facility] US1 [facility]  11/25/2023  8:15 AM Bernerd Limbo, CNM [facility]  11/26/2023  8:00 AM Thomasene Ripple, DO [facility] None  11/30/2023  9:15 AM [facility] NURSE [facility]  11/30/2023  9:30 AM [facility] US6 [facility]  12/02/2023  8:55 AM Dan Humphreys, Judye Bos, CNM [facility]    Bernerd Limbo, CNM

## 2023-11-16 ENCOUNTER — Ambulatory Visit: Payer: Medicaid Other

## 2023-11-16 ENCOUNTER — Ambulatory Visit: Payer: Medicaid Other | Attending: Obstetrics and Gynecology | Admitting: *Deleted

## 2023-11-16 ENCOUNTER — Other Ambulatory Visit: Payer: Self-pay

## 2023-11-16 ENCOUNTER — Encounter: Payer: Self-pay | Admitting: *Deleted

## 2023-11-16 VITALS — BP 130/74 | HR 91

## 2023-11-16 DIAGNOSIS — D563 Thalassemia minor: Secondary | ICD-10-CM

## 2023-11-16 DIAGNOSIS — O34219 Maternal care for unspecified type scar from previous cesarean delivery: Secondary | ICD-10-CM | POA: Insufficient documentation

## 2023-11-16 DIAGNOSIS — E669 Obesity, unspecified: Secondary | ICD-10-CM | POA: Diagnosis not present

## 2023-11-16 DIAGNOSIS — O3663X0 Maternal care for excessive fetal growth, third trimester, not applicable or unspecified: Secondary | ICD-10-CM | POA: Diagnosis not present

## 2023-11-16 DIAGNOSIS — O99213 Obesity complicating pregnancy, third trimester: Secondary | ICD-10-CM | POA: Insufficient documentation

## 2023-11-16 DIAGNOSIS — O10919 Unspecified pre-existing hypertension complicating pregnancy, unspecified trimester: Secondary | ICD-10-CM

## 2023-11-16 DIAGNOSIS — O99353 Diseases of the nervous system complicating pregnancy, third trimester: Secondary | ICD-10-CM

## 2023-11-16 DIAGNOSIS — G932 Benign intracranial hypertension: Secondary | ICD-10-CM

## 2023-11-16 DIAGNOSIS — O09293 Supervision of pregnancy with other poor reproductive or obstetric history, third trimester: Secondary | ICD-10-CM | POA: Diagnosis not present

## 2023-11-16 DIAGNOSIS — O10013 Pre-existing essential hypertension complicating pregnancy, third trimester: Secondary | ICD-10-CM | POA: Diagnosis not present

## 2023-11-16 DIAGNOSIS — O99843 Bariatric surgery status complicating pregnancy, third trimester: Secondary | ICD-10-CM

## 2023-11-16 DIAGNOSIS — O99013 Anemia complicating pregnancy, third trimester: Secondary | ICD-10-CM | POA: Diagnosis not present

## 2023-11-16 DIAGNOSIS — O10913 Unspecified pre-existing hypertension complicating pregnancy, third trimester: Secondary | ICD-10-CM | POA: Diagnosis not present

## 2023-11-16 DIAGNOSIS — Z3A35 35 weeks gestation of pregnancy: Secondary | ICD-10-CM | POA: Insufficient documentation

## 2023-11-16 DIAGNOSIS — O09299 Supervision of pregnancy with other poor reproductive or obstetric history, unspecified trimester: Secondary | ICD-10-CM

## 2023-11-16 DIAGNOSIS — O99210 Obesity complicating pregnancy, unspecified trimester: Secondary | ICD-10-CM

## 2023-11-17 ENCOUNTER — Other Ambulatory Visit: Payer: Self-pay | Admitting: *Deleted

## 2023-11-17 DIAGNOSIS — O10919 Unspecified pre-existing hypertension complicating pregnancy, unspecified trimester: Secondary | ICD-10-CM

## 2023-11-18 ENCOUNTER — Other Ambulatory Visit: Payer: Self-pay

## 2023-11-18 ENCOUNTER — Ambulatory Visit: Payer: Medicaid Other | Admitting: Certified Nurse Midwife

## 2023-11-18 VITALS — BP 110/61 | HR 109 | Wt 392.6 lb

## 2023-11-18 DIAGNOSIS — O10919 Unspecified pre-existing hypertension complicating pregnancy, unspecified trimester: Secondary | ICD-10-CM

## 2023-11-18 DIAGNOSIS — Z3A35 35 weeks gestation of pregnancy: Secondary | ICD-10-CM

## 2023-11-18 DIAGNOSIS — O10913 Unspecified pre-existing hypertension complicating pregnancy, third trimester: Secondary | ICD-10-CM

## 2023-11-18 DIAGNOSIS — Z98891 History of uterine scar from previous surgery: Secondary | ICD-10-CM

## 2023-11-18 DIAGNOSIS — O0993 Supervision of high risk pregnancy, unspecified, third trimester: Secondary | ICD-10-CM

## 2023-11-23 ENCOUNTER — Ambulatory Visit: Payer: Medicaid Other | Admitting: Cardiology

## 2023-11-23 ENCOUNTER — Ambulatory Visit: Payer: Medicaid Other

## 2023-11-23 ENCOUNTER — Encounter (HOSPITAL_COMMUNITY): Payer: Self-pay | Admitting: Obstetrics and Gynecology

## 2023-11-23 ENCOUNTER — Ambulatory Visit: Payer: Medicaid Other | Attending: Obstetrics and Gynecology

## 2023-11-23 ENCOUNTER — Inpatient Hospital Stay (HOSPITAL_COMMUNITY)
Admission: AD | Admit: 2023-11-23 | Discharge: 2023-11-23 | Disposition: A | Discharge status: Home / Self Care | Payer: Medicaid Other | Attending: Obstetrics and Gynecology | Admitting: Obstetrics and Gynecology

## 2023-11-23 DIAGNOSIS — O0993 Supervision of high risk pregnancy, unspecified, third trimester: Secondary | ICD-10-CM

## 2023-11-23 DIAGNOSIS — O10013 Pre-existing essential hypertension complicating pregnancy, third trimester: Secondary | ICD-10-CM | POA: Diagnosis not present

## 2023-11-23 DIAGNOSIS — G4733 Obstructive sleep apnea (adult) (pediatric): Secondary | ICD-10-CM | POA: Diagnosis not present

## 2023-11-23 DIAGNOSIS — W19XXXA Unspecified fall, initial encounter: Secondary | ICD-10-CM

## 2023-11-23 DIAGNOSIS — O09299 Supervision of pregnancy with other poor reproductive or obstetric history, unspecified trimester: Secondary | ICD-10-CM

## 2023-11-23 DIAGNOSIS — Z3A36 36 weeks gestation of pregnancy: Secondary | ICD-10-CM | POA: Diagnosis not present

## 2023-11-23 DIAGNOSIS — O99210 Obesity complicating pregnancy, unspecified trimester: Secondary | ICD-10-CM

## 2023-11-23 DIAGNOSIS — O99843 Bariatric surgery status complicating pregnancy, third trimester: Secondary | ICD-10-CM | POA: Insufficient documentation

## 2023-11-23 DIAGNOSIS — Z0371 Encounter for suspected problem with amniotic cavity and membrane ruled out: Secondary | ICD-10-CM | POA: Diagnosis not present

## 2023-11-23 DIAGNOSIS — Z5986 Financial insecurity: Secondary | ICD-10-CM | POA: Insufficient documentation

## 2023-11-23 DIAGNOSIS — O99513 Diseases of the respiratory system complicating pregnancy, third trimester: Secondary | ICD-10-CM | POA: Insufficient documentation

## 2023-11-23 DIAGNOSIS — O10919 Unspecified pre-existing hypertension complicating pregnancy, unspecified trimester: Secondary | ICD-10-CM

## 2023-11-23 LAB — WET PREP, GENITAL
Clue Cells Wet Prep HPF POC: NONE SEEN
Sperm: NONE SEEN
Trich, Wet Prep: NONE SEEN
WBC, Wet Prep HPF POC: >=10 — AB (ref <10)
Yeast Wet Prep HPF POC: NONE SEEN

## 2023-11-23 NOTE — MAU Note (Signed)
..  Maureen Lewis is a 28 y.o. at [redacted]w[redacted]d here in MAU reporting: around 1100 she tripped backwards on a box and feels like she pulled something on the left side of her back. She also feels the pain wrapping around the left side of her abdomen as well. Denies VB. Does report a gush of fluid when she fell, but unsure if it was urine or not. +FM.   Pain score: 8 Vitals:   11/23/23 1339  BP: (!) 118/59  Pulse: 90  Resp: 20  Temp: 98.3 F (36.8 C)  SpO2: 98%     FHT:154 Lab orders placed from triage:   none

## 2023-11-23 NOTE — MAU Provider Note (Signed)
History     [id number]  Arrival date and time: 11/23/23 1306    Chief Complaint  Patient presents with   Fall   Rupture of Membranes     HPI Maureen Lewis is a 28 y.o. at [redacted]w[redacted]d with PMHx notable for one prior cesarean, cHTN, gastric sleeve, pseudotumor, OSA, who presents for evaluation after a fall.   Patient reports she tripped over a box around [employer] and landed on her back Feels like she pulled something on the L side of her belly Has not taken anything for it No vaginal bleeding Has had intermittent contractions for some time, nothing new since the fall Fetal movement is normal  At moment of falling felt some wetness in her underwear and pants She changed her pants but then 20 minutes later felt she was wet again and had to change again Clear fluid, no odor or other features Does not feel like she's leaking right now   A/Positive/-- (07/18 1228)  OB History     Gravida  4   Para  1   Term  1   Preterm  0   AB  2   Living  1      SAB  2   IAB  0   Ectopic  0   Multiple  0   Live Births  1           Past Medical History:  Diagnosis Date   Abnormal uterine bleeding    Acid reflux    Alpha thalassemia trait    Amenorrhea    Anemia    no current med.   Asthma    Back pain    Chest pain    resolved   Complication of anesthesia    states had to keep giving her anesthesia during EGD   Constipation    Depression    "I'm good"   Fibromyalgia    Finger mass, left 03/2017   middle finger   Gestational hypertension 09/23/2019   Guidelines for Antenatal Testing and Sonography  (with updated ICD-10 codes)  Updated  09/23/19 with Dr. Noralee Space  INDICATION U/S 2 X week NST/AFI  or full BPP wkly DELIVERY Diabetes   A1 - good control - O24.410    A2 - good control - O24.419      A2  - poor control or poor compliance - O24.419, E11.65   (Macrosomia or polyhydramnios) **E11.65 is extra code for poor control**    A2/B - O24.    Headache    Hepatitis 07/18/2022   Hepatitis B   History of kidney stones    Hypertension    Irritable bowel syndrome (IBS)    Joint pain    Morbid obesity with body mass index (BMI) of 45.0 to 49.9 in adult [facility])    Plantar fasciitis, bilateral    resolved   Polycystic ovary syndrome    Pre-diabetes    no meds, diet controlled, diet not check blood sugar   Prediabetes 09/16/2017   Early 1hr 70     Shortness of breath    albuterol inhaler   Sleep apnea    no CPAP use   Vertigo 2017    Past Surgical History:  Procedure Laterality Date   CESAREAN SECTION N/A 10/09/2019   Procedure: CESAREAN SECTION;  Surgeon: [place] Bing, MD;  Location: [facility];  Service: Obstetrics;  Laterality: N/A;   COLONOSCOPY WITH PROPOFOL  10/07/2016   DILATION AND CURETTAGE OF UTERUS  ESOPHAGOGASTRODUODENOSCOPY  12/21/2015   EXCISION MASS UPPER EXTREMETIES Left 04/21/2017   Procedure: EXCISION MASS LEFT MIDDLE FINGER;  Surgeon: Cindee Salt, MD;  Location: [facility];  Service: Orthopedics;  Laterality: Left;   gastric sleeve  05/09/2022   IR FL GUIDED LOC OF NEEDLE/CATH TIP FOR SPINAL INJECTION RT  03/13/2020   KNEE ARTHROSCOPY WITH LATERAL MENISECTOMY Left 10/31/2022   Procedure: KNEE ARTHROSCOPY WITH PARTIAL LATERAL MENISECTOMY;  Surgeon: Yolonda Kida, MD;  Location: [facility] ORS;  Service: Orthopedics;  Laterality: Left;  77   KNEE ARTHROSCOPY WITH LATERAL RELEASE Left 10/31/2022   Procedure: KNEE ARTHROSCOPY WITH LATERAL RELEASE AND LIMITED SYNOVECTOMY;  Surgeon: Yolonda Kida, MD;  Location: [facility] ORS;  Service: Orthopedics;  Laterality: Left;  75   PHOTOCOAGULATION WITH LASER Left 12/13/2020   Procedure: RETINOPLEXY LEFT EYE;  Surgeon: Stephannie Li, MD;  [facility] OR;  Service: Ophthalmology;  Laterality: Left;   SCLERAL BUCKLE Right 12/13/2020   Procedure: SCLERAL BUCKLE WITH CRYO;  Surgeon: Stephannie Li, MD;  Location: [facility] OR;  Service: Ophthalmology;   Laterality: Right;    Family History  Problem Relation Age of Onset   Diabetes Maternal Aunt    Hypertension Maternal Uncle    Depression Maternal Grandfather    Diabetes Maternal Grandfather    Hypertension Maternal Grandfather    Arthritis Mother    High blood pressure Mother    Depression Mother    Sleep apnea Mother    Obesity Mother    Diabetes Mother    Hypertension Mother    Kidney failure Mother    Sleep apnea Father    Obesity Father    Healthy Daughter     Social History   Socioeconomic History   Marital status: Single    Spouse name: Not on file   Number of children: Not on file   Years of education: Not on file   Highest education level: Not on file  Occupational History   Occupation: IT sales professional    Employer: [id number]  Tobacco Use   Smoking status: Never   Smokeless tobacco: Never  Vaping Use   Vaping status: Never Used  Substance and Sexual Activity   Alcohol use: Not Currently    Comment: social    Drug use: No   Sexual activity: Not Currently    Birth control/protection: None  Other Topics Concern   Not on file  Social History Narrative   Right handed    Soda sometimes   Lives with mom and cousin, a grandmother in [place] also helps care for her.       She is in nursing school.    Social Drivers of Health   Financial Resource Strain: High Risk (11/05/2022)   Received [facility]   Overall Financial Resource Strain (CARDIA)    Difficulty of Paying Living Expenses: Hard  Food Insecurity: No Food Insecurity (05/07/2023)   Hunger Vital Sign    Worried About Running Out of Food in the Last Year: Never true    Ran Out of Food in the Last Year: Never true  Transportation Needs: No Transportation Needs (05/07/2023)   PRAPARE - Administrator, Civil Service (Medical): No    Lack of Transportation (Non-Medical): No  Physical Activity: Unknown (11/05/2022)   [facility]   Exercise Vital Sign     Days of Exercise per Week: 0 days    Minutes of Exercise per Session: Not on file  Recent  Concern: Physical Activity - Inactive (11/05/2022)   Received from [facility]   Exercise Vital Sign    Days of Exercise per Week: 0 days    Minutes of Exercise per Session: 40 min  Stress: Stress Concern Present (11/05/2022)   Received from [facility] Occupational Health - Occupational Stress Questionnaire    Feeling of Stress : Rather much  Social Connections: Somewhat Isolated (11/05/2022)   Received from [facility]   Social Network    How would you rate your social network (family, work, friends)?: Restricted participation with some degree of social isolation  Intimate Partner Violence: Not At Risk (11/05/2022)   Received from [facility]   HITS    Over the last 12 months how often did your partner physically hurt you?: Never    Over the last 12 months how often did your partner insult you or talk down to you?: Never    Over the last 12 months how often did your partner threaten you with physical harm?: Never    Over the last 12 months how often did your partner scream or curse at you?: Never    Allergies  Allergen Reactions   Bee Venom Hives, Shortness Of Breath and Swelling   Hydrocodone-Acetaminophen Anaphylaxis    No problem when she takes Tylenol   Peach Flavoring Agent (Non-Screening) Hives, Shortness Of Breath and Other (See Comments)    SWELLING OF MOUTH   Pollen Extract Hives, Shortness Of Breath and Swelling   Remdesivir Swelling    Angioedema 8/25   Latex Rash   Cortisone Itching and Swelling   Cortizone-10 [Hydrocortisone]     Swelling and itching   Toradol [Ketorolac Tromethamine] Swelling    No current facility-administered medications on file prior to encounter.   Current Outpatient Medications on File Prior to Encounter  Medication Sig Dispense Refill   aspirin EC 81 MG  tablet Take 2 tablets (162 mg total) by mouth daily. Swallow whole. 180 tablet 12   Prenatal Vit-Fe Phos-FA-Omega (VITAFOL GUMMIES) 3.33-0.333-34.8 MG CHEW Chew 1 tablet by mouth daily. 90 tablet 3   acetaminophen (TYLENOL) 500 MG tablet Take 2 tablets (1,000 mg total) by mouth every 6 (six) hours as needed for fever or headache. (Patient not taking: Reported on 11/18/2023) 30 tablet 0   albuterol (VENTOLIN HFA) 108 (90 Base) MCG/ACT inhaler Inhale 1 puff into the lungs every 4 (four) hours as needed for shortness of breath. (Patient not taking: Reported on 11/18/2023)     Blood Pressure Monitoring (BLOOD PRESSURE KIT) DEVI 1 Device by Does not apply route as needed. 1 each 0   fluticasone (FLONASE) 50 MCG/ACT nasal spray Place 2 sprays into both nostrils daily. (Patient not taking: Reported on 11/18/2023) 16 g 6   lidocaine (LIDODERM) 5 % Place 1 patch onto the skin daily. Remove & Discard patch within 12 hours or as directed by MD (Patient not taking: Reported on 11/18/2023) 15 patch 0   Magnesium Oxide -Mg Supplement (MAG-OXIDE) 200 MG TABS Take 2 tablets (400 mg total) by mouth at bedtime. If that amount causes loose stools in the am, switch to 200mg  daily at bedtime. 60 tablet 3   Vitamin D, Ergocalciferol, (DRISDOL) 1.25 MG (50000 UNIT) CAPS capsule Take 1 capsule (50,000 Units total) by mouth every 7 (seven) days. 10 capsule 0     ROS Pertinent positives and negative per HPI, all others reviewed and negative  Physical Exam   BP (!) 118/59   Pulse 90   Temp 98.3 F (36.8 C) (Oral)   Resp 20   LMP 03/07/2023 Comment: irreg bleeding, neg blood test 5/19  SpO2 98%   Patient Vitals for the past 24 hrs:  BP Temp Temp src Pulse Resp SpO2  11/23/23 1339 (!) 118/59 98.3 F (36.8 C) Oral 90 20 98 %    Physical Exam Vitals reviewed.  Constitutional:      General: She is not in acute distress.    Appearance: She is well-developed. She is not diaphoretic.  Eyes:     General: No scleral  icterus. Pulmonary:     Effort: Pulmonary effort is normal. No respiratory distress.  Abdominal:     General: There is no distension.     Palpations: Abdomen is soft.     Tenderness: There is abdominal tenderness. There is no guarding or rebound.     Comments: Very mild tenderness to palpation of L side of abdomen  Skin:    General: Skin is warm and dry.     Findings: No bruising.  Neurological:     Mental Status: She is alert.     Coordination: Coordination normal.      Cervical Exam    Bedside Ultrasound Not performed.  My interpretation: n/a  FHT Baseline: 145 bpm Variability: Good {> 6 bpm) Accelerations: Reactive Decelerations: Absent Uterine activity: None Cat: I  Labs No results found. However, due to the size of the patient record, not all encounters were searched. Please check Results Review for a complete set of results.  Imaging No results found.  MAU Course  Procedures Lab Orders  No laboratory test(s) ordered today   No orders of the defined types were placed in this encounter.  Imaging Orders  No imaging studies ordered today    MDM Moderate (Level 3-4)  Assessment and Plan  # Encounter after fall, initial encounter #[redacted] weeks gestation of pregnancy Patient with no direct abdominal trauma, Rh positive. NST reactive and reassuring. Symptoms likely due to muscle spasm, conservative treatment.   #Encounter for suspected rupture of membranes, with rupture of membranes not found No fluid in vagina, neg valsalva, ruled out for rupture.   #FWB FHT Cat I NST: Reactive   Dispo: discharged to home in stable condition    Venora Maples, MD/MPH 11/23/23 2:08 PM  Allergies as of 11/23/2023       Reactions   Bee Venom Hives, Shortness Of Breath, Swelling   Hydrocodone-acetaminophen Anaphylaxis   No problem when she takes Tylenol   Peach Flavoring Agent (non-screening) Hives, Shortness Of Breath, Other (See Comments)   SWELLING OF MOUTH    Pollen Extract Hives, Shortness Of Breath, Swelling   Remdesivir Swelling   Angioedema 8/25   Latex Rash   Cortisone Itching, Swelling   Cortizone-10 [hydrocortisone]    Swelling and itching   Toradol [ketorolac Tromethamine] Swelling        Medication List     TAKE these medications    acetaminophen 500 MG tablet Commonly known as: TYLENOL Take 2 tablets (1,000 mg total) by mouth every 6 (six) hours as needed for fever or headache.   albuterol 108 (90 Base) MCG/ACT inhaler Commonly known as: VENTOLIN HFA Inhale 1 puff into the lungs every 4 (four) hours as needed for shortness of breath.   aspirin EC 81 MG tablet Take 2 tablets (162 mg total) by mouth daily. Swallow whole.   Blood Pressure Kit  Devi 1 Device by Does not apply route as needed.   fluticasone 50 MCG/ACT nasal spray Commonly known as: FLONASE Place 2 sprays into both nostrils daily.   lidocaine 5 % Commonly known as: LIDODERM Place 1 patch onto the skin daily. Remove & Discard patch within 12 hours or as directed by MD   Mag-Oxide 200 MG Tabs Generic drug: Magnesium Oxide -Mg Supplement Take 2 tablets (400 mg total) by mouth at bedtime. If that amount causes loose stools in the am, switch to 200mg  daily at bedtime.   Vitafol Gummies 3.33-0.333-34.8 MG Chew Chew 1 tablet by mouth daily.   Vitamin D (Ergocalciferol) 1.25 MG (50000 UNIT) Caps capsule Commonly known as: DRISDOL Take 1 capsule (50,000 Units total) by mouth every 7 (seven) days.

## 2023-11-24 LAB — GC/CHLAMYDIA PROBE AMP (~~LOC~~) NOT AT ARMC
Chlamydia: NEGATIVE
Comment: NEGATIVE
Comment: NORMAL
Neisseria Gonorrhea: NEGATIVE

## 2023-11-24 NOTE — Progress Notes (Signed)
   PRENATAL VISIT NOTE  Subjective:  Maureen Lewis is a 28 y.o. H5E8978 at [redacted]w[redacted]d being seen today for ongoing prenatal care.  She is currently monitored for the following issues for this high-risk pregnancy and has OSA (obstructive sleep apnea); PCOS (polycystic ovarian syndrome); Vitamin D  deficiency; Alpha thalassemia trait; Supervision of high-risk pregnancy; History of gestational diabetes mellitus (GDM) in prior pregnancy, currently pregnant; Obesity in pregnancy; S/P laparoscopic sleeve gastrectomy; Idiopathic intracranial hypertension; Abnormal Pap smear of cervix; Headache in pregnancy; Chronic hypertension affecting pregnancy; Iron  deficiency anemia; and History of cesarean section on their problem list.  Patient reports no complaints.  Contractions: Irregular. Vag. Bleeding: None.  Movement: Present. Denies leaking of fluid.   The following portions of the patient's history were reviewed and updated as appropriate: allergies, current medications, past family history, past medical history, past social history, past surgical history and problem list.   Objective:   Vitals:   11/25/23 0829  BP: 118/76  Pulse: 96  Weight: (!) 393 lb 6.4 oz (178.4 kg)    Fetal Status: Fetal Heart Rate (bpm): 150   Movement: Present     General:  Alert, oriented and cooperative. Patient is in no acute distress.  Skin: Skin is warm and dry. No rash noted.   Cardiovascular: Normal heart rate noted  Respiratory: Normal respiratory effort, no problems with respiration noted  Abdomen: Soft, gravid, appropriate for gestational age.  Pain/Pressure: Absent     Pelvic: Cervical exam deferred        Extremities: Normal range of motion.  Edema: None  Mental Status: Normal mood and affect. Normal behavior. Normal judgment and thought content.   Assessment and Plan:  Pregnancy: G4P1021 at [redacted]w[redacted]d 1. Supervision of high risk pregnancy in third trimester (Primary) - Doing well, feeling regular and  vigorous fetal movement   2. [redacted] weeks gestation of pregnancy - Routine OB care  - Culture, beta strep (group b only)  3. Chronic hypertension affecting pregnancy - Stable, BP typically low  4. Idiopathic intracranial hypertension - Stable  5. History of cesarean section - Planning rCS with BTS  6. Iron  deficiency anemia secondary to inadequate dietary iron  intake - Resolved  7. Vitamin D  deficiency -  Prescribed 50kU weekly  Preterm labor symptoms and general obstetric precautions including but not limited to vaginal bleeding, contractions, leaking of fluid and fetal movement were reviewed in detail with the patient. Please refer to After Visit Summary for other counseling recommendations.   Return in about 1 week (around 12/02/2023) for IN-PERSON, HOB.  Future Appointments  Date Time Provider Department Center  11/30/2023  9:15 AM [facility] NURSE [facility]  11/30/2023  9:30 AM [facility] US6 [facility]  12/02/2023  8:55 AM Vannie Cornell SAUNDERS, CNM [facility]  12/07/2023  9:30 AM [facility] PAT 1 [facility] None    Cornell SAUNDERS Vannie, CNM

## 2023-11-25 ENCOUNTER — Other Ambulatory Visit: Payer: Self-pay

## 2023-11-25 ENCOUNTER — Ambulatory Visit: Payer: Medicaid Other | Admitting: Certified Nurse Midwife

## 2023-11-25 VITALS — BP 118/76 | HR 96 | Wt 393.4 lb

## 2023-11-25 DIAGNOSIS — D508 Other iron deficiency anemias: Secondary | ICD-10-CM | POA: Diagnosis not present

## 2023-11-25 DIAGNOSIS — O10913 Unspecified pre-existing hypertension complicating pregnancy, third trimester: Secondary | ICD-10-CM

## 2023-11-25 DIAGNOSIS — G932 Benign intracranial hypertension: Secondary | ICD-10-CM

## 2023-11-25 DIAGNOSIS — E559 Vitamin D deficiency, unspecified: Secondary | ICD-10-CM

## 2023-11-25 DIAGNOSIS — O0993 Supervision of high risk pregnancy, unspecified, third trimester: Secondary | ICD-10-CM | POA: Diagnosis not present

## 2023-11-25 DIAGNOSIS — Z3A36 36 weeks gestation of pregnancy: Secondary | ICD-10-CM | POA: Diagnosis not present

## 2023-11-25 DIAGNOSIS — O10919 Unspecified pre-existing hypertension complicating pregnancy, unspecified trimester: Secondary | ICD-10-CM

## 2023-11-25 DIAGNOSIS — Z98891 History of uterine scar from previous surgery: Secondary | ICD-10-CM

## 2023-11-25 LAB — POCT URINALYSIS DIP (DEVICE)
Bilirubin Urine: NEGATIVE
Glucose, UA: NEGATIVE mg/dL
Hgb urine dipstick: NEGATIVE
Ketones, ur: NEGATIVE mg/dL
Leukocytes,Ua: NEGATIVE
Nitrite: NEGATIVE
Protein, ur: NEGATIVE mg/dL
Specific Gravity, Urine: 1.025 (ref 1.005–1.030)
Urobilinogen, UA: 1 mg/dL (ref 0.0–1.0)
pH: 7 (ref 5.0–8.0)

## 2023-11-25 NOTE — Progress Notes (Signed)
 LC requested to visit parent today, per request from provider [facility] CNM.  Parent shared this is their second child and wants to breastfeed, was referred to the [facility] pathway 2 lactation clinic and they discussed impacts of c-section on milk production and counseled on hand expression to harvest colostrum. Patient shared has noted colostrum drops come out of left breast, but not right and noted no changes of breasts in pregnancy other than some sensitivity and tenderness. Shared first child had tethered oral ties, and would like lactation consult after giving birth and knows how to advocate for lactation support in hospital. Plans to have scheduled c-section on 2.18.2025.   Congratulated parent on pregnancy and plans to breastfeed.  Counseled on milk production, harvesting colostrum, hand expression, milk volume expectations, importance of colostrum, and gave parent 5 colostrum bullets. Demonstrated hand expression with consent of mom. At this time, we did not see any droplets. Encouraged mom that does not mean the colostrum is not in there, and counseled on hand expression 1-3 x a day if desired for a total of 5-10 minutes each time. Reviewed difference in hand expression, pump and latch. Reviewed use of heat and cold and updated recommendations, light gentle massage, milk storage, and how to bring the milk to the hospital and notify the staff.  Encouraged to call outpatient lactation after giving birth to schedule an appointment.   Vermell Pelt, BSW, IBCLC

## 2023-11-26 ENCOUNTER — Ambulatory Visit: Payer: Medicaid Other | Attending: Cardiology | Admitting: Cardiology

## 2023-11-26 NOTE — Addendum Note (Signed)
 Addended by: Lemuel Quaker on: 11/26/2023 10:15 AM   Modules accepted: Level of Service

## 2023-11-29 LAB — CULTURE, BETA STREP (GROUP B ONLY): Strep Gp B Culture: NEGATIVE

## 2023-11-30 ENCOUNTER — Ambulatory Visit (HOSPITAL_BASED_OUTPATIENT_CLINIC_OR_DEPARTMENT_OTHER): Payer: Medicaid Other | Admitting: Obstetrics and Gynecology

## 2023-11-30 ENCOUNTER — Ambulatory Visit: Payer: Medicaid Other | Attending: Obstetrics and Gynecology

## 2023-11-30 ENCOUNTER — Ambulatory Visit: Payer: Medicaid Other | Admitting: *Deleted

## 2023-11-30 ENCOUNTER — Other Ambulatory Visit: Payer: Self-pay

## 2023-11-30 VITALS — BP 136/81 | HR 86

## 2023-11-30 DIAGNOSIS — O09299 Supervision of pregnancy with other poor reproductive or obstetric history, unspecified trimester: Secondary | ICD-10-CM | POA: Diagnosis not present

## 2023-11-30 DIAGNOSIS — Z3A37 37 weeks gestation of pregnancy: Secondary | ICD-10-CM | POA: Diagnosis not present

## 2023-11-30 DIAGNOSIS — O34219 Maternal care for unspecified type scar from previous cesarean delivery: Secondary | ICD-10-CM | POA: Diagnosis not present

## 2023-11-30 DIAGNOSIS — O99013 Anemia complicating pregnancy, third trimester: Secondary | ICD-10-CM

## 2023-11-30 DIAGNOSIS — O99210 Obesity complicating pregnancy, unspecified trimester: Secondary | ICD-10-CM | POA: Insufficient documentation

## 2023-11-30 DIAGNOSIS — O10919 Unspecified pre-existing hypertension complicating pregnancy, unspecified trimester: Secondary | ICD-10-CM | POA: Insufficient documentation

## 2023-11-30 DIAGNOSIS — O3663X0 Maternal care for excessive fetal growth, third trimester, not applicable or unspecified: Secondary | ICD-10-CM

## 2023-11-30 DIAGNOSIS — O99213 Obesity complicating pregnancy, third trimester: Secondary | ICD-10-CM | POA: Insufficient documentation

## 2023-11-30 DIAGNOSIS — E669 Obesity, unspecified: Secondary | ICD-10-CM

## 2023-11-30 DIAGNOSIS — D563 Thalassemia minor: Secondary | ICD-10-CM

## 2023-11-30 DIAGNOSIS — O0993 Supervision of high risk pregnancy, unspecified, third trimester: Secondary | ICD-10-CM

## 2023-11-30 DIAGNOSIS — O10013 Pre-existing essential hypertension complicating pregnancy, third trimester: Secondary | ICD-10-CM | POA: Diagnosis not present

## 2023-11-30 DIAGNOSIS — E6689 Other obesity not elsewhere classified: Secondary | ICD-10-CM | POA: Insufficient documentation

## 2023-11-30 DIAGNOSIS — Z8632 Personal history of gestational diabetes: Secondary | ICD-10-CM | POA: Insufficient documentation

## 2023-11-30 DIAGNOSIS — G932 Benign intracranial hypertension: Secondary | ICD-10-CM

## 2023-11-30 DIAGNOSIS — O09293 Supervision of pregnancy with other poor reproductive or obstetric history, third trimester: Secondary | ICD-10-CM

## 2023-11-30 DIAGNOSIS — O99843 Bariatric surgery status complicating pregnancy, third trimester: Secondary | ICD-10-CM

## 2023-11-30 DIAGNOSIS — O99353 Diseases of the nervous system complicating pregnancy, third trimester: Secondary | ICD-10-CM

## 2023-11-30 NOTE — Progress Notes (Signed)
  Maternal-Fetal Medicine Consultation I had the pleasure of seeing Ms. Chadderdon today at [facility]. She is G4 P1021 at 37w 1d gestation and is here for fetal growth assessment and antenatal testing.  Chronic hypertension.  Well-controlled without antihypertensives.  Blood pressure today at our office is 136/81 mmHg. Pregravid BMI 48. Patient does not have gestational diabetes. History of retinal detachment surgery.  History of bariatric surgery.  Patient reports she lost 100 pounds.  Ultrasound Amniotic fluid is normal good fetal activity seen.  Cephalic presentation.  The estimated fetal weight is at the 93rd percentile and the abdominal circumference measurement at the 99th percentile.  Antenatal testing is reassuring.  BPP 8/8.  Placenta is posterior and there is no evidence of previa or placenta accreta spectrum.  I explained the findings and the limitations of ultrasound and accurately estimating fetal weights.  Patient is scheduled to undergo cesarean delivery next week.  She had questions about VBAC.  I reviewed the operative note.  I counseled her that she had low transverse cesarean delivery.  Patient can attempt vaginal delivery.  I counseled her that the risk of uterine scar dehiscence is 1 to 2% (predicted induction of labor). Repeat cesarean deliveries increase the risk of placenta previa and/or placenta accreta spectrum.  Recommendations -No follow-up appointments were made.  Thank you for consultation.  If you have any questions or concerns, please contact me the [facility].  Consultation including face-to-face (more than 50%) counseling 20 minutes.

## 2023-12-01 ENCOUNTER — Inpatient Hospital Stay (HOSPITAL_COMMUNITY): Payer: Medicaid Other

## 2023-12-01 ENCOUNTER — Encounter (HOSPITAL_COMMUNITY): Payer: Self-pay | Admitting: Obstetrics & Gynecology

## 2023-12-01 ENCOUNTER — Inpatient Hospital Stay (HOSPITAL_COMMUNITY)
Admission: AD | Admit: 2023-12-01 | Discharge: 2023-12-04 | DRG: 784 | Disposition: A | Discharge status: Home / Self Care | Payer: Medicaid Other | Attending: Obstetrics and Gynecology | Admitting: Obstetrics and Gynecology

## 2023-12-01 ENCOUNTER — Inpatient Hospital Stay (HOSPITAL_COMMUNITY): Payer: Medicaid Other | Admitting: Anesthesiology

## 2023-12-01 ENCOUNTER — Encounter (HOSPITAL_COMMUNITY)
Admission: AD | Disposition: A | Discharge status: Home / Self Care | Payer: Self-pay | Source: Home / Self Care | Attending: Obstetrics and Gynecology

## 2023-12-01 ENCOUNTER — Other Ambulatory Visit: Payer: Self-pay

## 2023-12-01 DIAGNOSIS — Z886 Allergy status to analgesic agent status: Secondary | ICD-10-CM

## 2023-12-01 DIAGNOSIS — Z883 Allergy status to other anti-infective agents status: Secondary | ICD-10-CM | POA: Diagnosis not present

## 2023-12-01 DIAGNOSIS — O99354 Diseases of the nervous system complicating childbirth: Secondary | ICD-10-CM | POA: Diagnosis present

## 2023-12-01 DIAGNOSIS — K219 Gastro-esophageal reflux disease without esophagitis: Secondary | ICD-10-CM | POA: Diagnosis not present

## 2023-12-01 DIAGNOSIS — O368130 Decreased fetal movements, third trimester, not applicable or unspecified: Secondary | ICD-10-CM | POA: Diagnosis present

## 2023-12-01 DIAGNOSIS — E559 Vitamin D deficiency, unspecified: Secondary | ICD-10-CM | POA: Diagnosis present

## 2023-12-01 DIAGNOSIS — O114 Pre-existing hypertension with pre-eclampsia, complicating childbirth: Secondary | ICD-10-CM | POA: Diagnosis not present

## 2023-12-01 DIAGNOSIS — O1413 Severe pre-eclampsia, third trimester: Principal | ICD-10-CM

## 2023-12-01 DIAGNOSIS — Z5986 Financial insecurity: Secondary | ICD-10-CM

## 2023-12-01 DIAGNOSIS — Z3A37 37 weeks gestation of pregnancy: Secondary | ICD-10-CM | POA: Diagnosis not present

## 2023-12-01 DIAGNOSIS — Z302 Encounter for sterilization: Secondary | ICD-10-CM | POA: Diagnosis not present

## 2023-12-01 DIAGNOSIS — O34211 Maternal care for low transverse scar from previous cesarean delivery: Secondary | ICD-10-CM | POA: Diagnosis not present

## 2023-12-01 DIAGNOSIS — O1414 Severe pre-eclampsia complicating childbirth: Secondary | ICD-10-CM | POA: Diagnosis not present

## 2023-12-01 DIAGNOSIS — O9962 Diseases of the digestive system complicating childbirth: Secondary | ICD-10-CM | POA: Diagnosis not present

## 2023-12-01 DIAGNOSIS — O34219 Maternal care for unspecified type scar from previous cesarean delivery: Secondary | ICD-10-CM | POA: Diagnosis not present

## 2023-12-01 DIAGNOSIS — N7011 Chronic salpingitis: Secondary | ICD-10-CM | POA: Diagnosis not present

## 2023-12-01 DIAGNOSIS — Z98891 History of uterine scar from previous surgery: Secondary | ICD-10-CM

## 2023-12-01 DIAGNOSIS — O99844 Bariatric surgery status complicating childbirth: Secondary | ICD-10-CM | POA: Diagnosis present

## 2023-12-01 DIAGNOSIS — G43909 Migraine, unspecified, not intractable, without status migrainosus: Secondary | ICD-10-CM | POA: Diagnosis present

## 2023-12-01 DIAGNOSIS — O1092 Unspecified pre-existing hypertension complicating childbirth: Secondary | ICD-10-CM | POA: Diagnosis present

## 2023-12-01 DIAGNOSIS — Z148 Genetic carrier of other disease: Secondary | ICD-10-CM | POA: Diagnosis not present

## 2023-12-01 DIAGNOSIS — Z833 Family history of diabetes mellitus: Secondary | ICD-10-CM | POA: Diagnosis not present

## 2023-12-01 DIAGNOSIS — J329 Chronic sinusitis, unspecified: Secondary | ICD-10-CM | POA: Diagnosis not present

## 2023-12-01 DIAGNOSIS — Z9079 Acquired absence of other genital organ(s): Secondary | ICD-10-CM

## 2023-12-01 DIAGNOSIS — Z7982 Long term (current) use of aspirin: Secondary | ICD-10-CM

## 2023-12-01 DIAGNOSIS — Z888 Allergy status to other drugs, medicaments and biological substances status: Secondary | ICD-10-CM

## 2023-12-01 DIAGNOSIS — Z91040 Latex allergy status: Secondary | ICD-10-CM | POA: Diagnosis not present

## 2023-12-01 DIAGNOSIS — Z8249 Family history of ischemic heart disease and other diseases of the circulatory system: Secondary | ICD-10-CM

## 2023-12-01 DIAGNOSIS — M797 Fibromyalgia: Secondary | ICD-10-CM | POA: Diagnosis present

## 2023-12-01 DIAGNOSIS — O26893 Other specified pregnancy related conditions, third trimester: Secondary | ICD-10-CM | POA: Diagnosis not present

## 2023-12-01 DIAGNOSIS — Z3A00 Weeks of gestation of pregnancy not specified: Secondary | ICD-10-CM | POA: Diagnosis not present

## 2023-12-01 DIAGNOSIS — R519 Headache, unspecified: Secondary | ICD-10-CM | POA: Diagnosis not present

## 2023-12-01 DIAGNOSIS — O99214 Obesity complicating childbirth: Secondary | ICD-10-CM | POA: Diagnosis not present

## 2023-12-01 DIAGNOSIS — O1493 Unspecified pre-eclampsia, third trimester: Secondary | ICD-10-CM | POA: Diagnosis present

## 2023-12-01 LAB — COMPREHENSIVE METABOLIC PANEL
ALT: 13 U/L (ref 0–44)
AST: 14 U/L — ABNORMAL LOW (ref 15–41)
Albumin: 2.8 g/dL — ABNORMAL LOW (ref 3.5–5.0)
Alkaline Phosphatase: 119 U/L (ref 38–126)
Anion gap: 9 (ref 5–15)
BUN: 5 mg/dL — ABNORMAL LOW (ref 6–20)
CO2: 23 mmol/L (ref 22–32)
Calcium: 8.9 mg/dL (ref 8.9–10.3)
Chloride: 105 mmol/L (ref 98–111)
Creatinine, Ser: 0.46 mg/dL (ref 0.44–1.00)
GFR, Estimated: >60 mL/min (ref >60)
Glucose, Bld: 93 mg/dL (ref 70–99)
Potassium: 3.9 mmol/L (ref 3.5–5.1)
Sodium: 137 mmol/L (ref 135–145)
Total Bilirubin: 0.6 mg/dL (ref 0.0–1.2)
Total Protein: 6.5 g/dL (ref 6.5–8.1)

## 2023-12-01 LAB — URINALYSIS, ROUTINE W REFLEX MICROSCOPIC
Bilirubin Urine: NEGATIVE
Glucose, UA: NEGATIVE mg/dL
Hgb urine dipstick: NEGATIVE
Ketones, ur: NEGATIVE mg/dL
Leukocytes,Ua: NEGATIVE
Nitrite: NEGATIVE
Protein, ur: NEGATIVE mg/dL
Specific Gravity, Urine: 1.012 (ref 1.005–1.030)
pH: 7 (ref 5.0–8.0)

## 2023-12-01 LAB — CBC
HCT: 35.2 % — ABNORMAL LOW (ref 36.0–46.0)
Hemoglobin: 11.3 g/dL — ABNORMAL LOW (ref 12.0–15.0)
MCH: 24.4 pg — ABNORMAL LOW (ref 26.0–34.0)
MCHC: 32.1 g/dL (ref 30.0–36.0)
MCV: 76 fL — ABNORMAL LOW (ref 80.0–100.0)
Platelets: 201 10*3/uL (ref 150–400)
RBC: 4.63 MIL/uL (ref 3.87–5.11)
RDW: 14.4 % (ref 11.5–15.5)
WBC: 6 10*3/uL (ref 4.0–10.5)
nRBC: 0 % (ref 0.0–0.2)

## 2023-12-01 LAB — TYPE AND SCREEN
ABO/RH(D): A POS
Antibody Screen: NEGATIVE

## 2023-12-01 LAB — VITAMIN B12: Vitamin B-12: 98 pg/mL — ABNORMAL LOW (ref 180–914)

## 2023-12-01 LAB — FOLATE: Folate: 8.6 ng/mL (ref >5.9)

## 2023-12-01 LAB — SAMPLE TO BLOOD BANK

## 2023-12-01 LAB — PROTEIN / CREATININE RATIO, URINE
Creatinine, Urine: 70 mg/dL
Protein Creatinine Ratio: 0.1 mg/mg{creat} (ref 0.00–0.15)
Total Protein, Urine: 7 mg/dL

## 2023-12-01 LAB — FERRITIN: Ferritin: 29 ng/mL (ref 11–307)

## 2023-12-01 SURGERY — Surgical Case
Anesthesia: Spinal

## 2023-12-01 MED ORDER — AMISULPRIDE (ANTIEMETIC) 5 MG/2ML IV SOLN: 10.0000 mg | Freq: Once | INTRAVENOUS | Status: DC | PRN

## 2023-12-01 MED ORDER — HYDROMORPHONE HCL 1 MG/ML IJ SOLN
INTRAMUSCULAR | Status: AC
  Filled 2023-12-01: qty 0.5

## 2023-12-01 MED ORDER — WITCH HAZEL-GLYCERIN EX PADS: 1.0000 | MEDICATED_PAD | CUTANEOUS | Status: DC | PRN

## 2023-12-01 MED ORDER — LACTATED RINGERS IV SOLN: INTRAVENOUS | Status: AC

## 2023-12-01 MED ORDER — ONDANSETRON HCL 4 MG/2ML IJ SOLN
INTRAMUSCULAR | Status: AC
  Filled 2023-12-01: qty 2

## 2023-12-01 MED ORDER — PROPOFOL 10 MG/ML IV BOLUS
INTRAVENOUS | Status: DC | PRN
Start: 2023-12-01 — End: 2023-12-01
  Administered 2023-12-01: 200 mg via INTRAVENOUS

## 2023-12-01 MED ORDER — ONDANSETRON HCL 4 MG/2ML IJ SOLN: 4.0000 mg | Freq: Once | INTRAMUSCULAR | Status: DC | PRN

## 2023-12-01 MED ORDER — FENTANYL CITRATE (PF) 100 MCG/2ML IJ SOLN
INTRAMUSCULAR | Status: DC | PRN
  Administered 2023-12-01: 100 ug via INTRAVENOUS
  Administered 2023-12-01: 50 ug via INTRAVENOUS
  Administered 2023-12-01: 100 ug via INTRAVENOUS

## 2023-12-01 MED ORDER — ACETAMINOPHEN-CAFFEINE 500-65 MG PO TABS
2.0000 | ORAL_TABLET | Freq: Once | ORAL | Status: AC
  Administered 2023-12-01: 2 via ORAL
  Filled 2023-12-01: qty 2

## 2023-12-01 MED ORDER — PROPOFOL 10 MG/ML IV BOLUS
INTRAVENOUS | Status: AC
  Filled 2023-12-01: qty 20

## 2023-12-01 MED ORDER — LACTATED RINGERS IV SOLN: INTRAVENOUS | Status: DC

## 2023-12-01 MED ORDER — OXYTOCIN-SODIUM CHLORIDE 30-0.9 UT/500ML-% IV SOLN: 2.5000 [IU]/h | INTRAVENOUS | Status: AC

## 2023-12-01 MED ORDER — MAGNESIUM SULFATE 2 GM/50ML IV SOLN
2.0000 g | Freq: Once | INTRAVENOUS | Status: AC
  Administered 2023-12-01: 2 g via INTRAVENOUS
  Filled 2023-12-01: qty 50

## 2023-12-01 MED ORDER — PRENATAL MULTIVITAMIN CH
1.0000 | ORAL_TABLET | Freq: Every day | ORAL | Status: DC
  Administered 2023-12-02 – 2023-12-04 (×3): 1 via ORAL
  Filled 2023-12-01 (×3): qty 1

## 2023-12-01 MED ORDER — SUCCINYLCHOLINE CHLORIDE 200 MG/10ML IV SOSY
PREFILLED_SYRINGE | INTRAVENOUS | Status: DC | PRN
Start: 2023-12-01 — End: 2023-12-01
  Administered 2023-12-01: 180 mg via INTRAVENOUS

## 2023-12-01 MED ORDER — HYDRALAZINE HCL 20 MG/ML IJ SOLN
10.0000 mg | INTRAMUSCULAR | Status: DC | PRN
Start: 2023-12-01 — End: 2023-12-01

## 2023-12-01 MED ORDER — STERILE WATER FOR IRRIGATION IR SOLN
Status: DC | PRN
  Administered 2023-12-01: 1000 mL

## 2023-12-01 MED ORDER — SODIUM CHLORIDE 0.9% FLUSH: 3.0000 mL | INTRAVENOUS | Status: DC | PRN

## 2023-12-01 MED ORDER — SCOPOLAMINE 1 MG/3DAYS TD PT72
1.0000 | MEDICATED_PATCH | Freq: Once | TRANSDERMAL | Status: DC
  Administered 2023-12-01: 1.5 mg via TRANSDERMAL
  Filled 2023-12-01: qty 1

## 2023-12-01 MED ORDER — FENTANYL CITRATE (PF) 100 MCG/2ML IJ SOLN
INTRAMUSCULAR | Status: AC
  Filled 2023-12-01: qty 2

## 2023-12-01 MED ORDER — FENTANYL CITRATE (PF) 250 MCG/5ML IJ SOLN
INTRAMUSCULAR | Status: AC
  Filled 2023-12-01: qty 5

## 2023-12-01 MED ORDER — DIPHENHYDRAMINE HCL 50 MG/ML IJ SOLN: 12.5000 mg | INTRAMUSCULAR | Status: DC | PRN

## 2023-12-01 MED ORDER — BUPIVACAINE IN DEXTROSE 0.75-8.25 % IT SOLN
INTRATHECAL | Status: DC | PRN
  Administered 2023-12-01: 1.6 mL via INTRATHECAL

## 2023-12-01 MED ORDER — MAGNESIUM SULFATE 40 GM/1000ML IV SOLN
2.0000 g/h | INTRAVENOUS | Status: DC
Start: 2023-12-01 — End: 2023-12-01
  Filled 2023-12-01: qty 1000

## 2023-12-01 MED ORDER — METOCLOPRAMIDE HCL 5 MG/ML IJ SOLN
10.0000 mg | Freq: Once | INTRAMUSCULAR | Status: AC
  Administered 2023-12-01: 10 mg via INTRAVENOUS
  Filled 2023-12-01: qty 2

## 2023-12-01 MED ORDER — SENNOSIDES-DOCUSATE SODIUM 8.6-50 MG PO TABS
2.0000 | ORAL_TABLET | ORAL | Status: DC
  Administered 2023-12-02 – 2023-12-04 (×3): 2 via ORAL
  Filled 2023-12-01 (×3): qty 2

## 2023-12-01 MED ORDER — ACETAMINOPHEN 500 MG PO TABS: 1000.0000 mg | ORAL_TABLET | Freq: Four times a day (QID) | ORAL | Status: DC

## 2023-12-01 MED ORDER — OXYTOCIN-SODIUM CHLORIDE 30-0.9 UT/500ML-% IV SOLN
INTRAVENOUS | Status: AC
  Filled 2023-12-01: qty 1500

## 2023-12-01 MED ORDER — FENTANYL CITRATE (PF) 100 MCG/2ML IJ SOLN
25.0000 ug | INTRAMUSCULAR | Status: DC | PRN
  Administered 2023-12-01 (×2): 50 ug via INTRAVENOUS

## 2023-12-01 MED ORDER — SIMETHICONE 80 MG PO CHEW: 80.0000 mg | CHEWABLE_TABLET | ORAL | Status: DC | PRN

## 2023-12-01 MED ORDER — PHENYLEPHRINE HCL-NACL 20-0.9 MG/250ML-% IV SOLN
INTRAVENOUS | Status: DC | PRN
  Administered 2023-12-01: 60 ug/min via INTRAVENOUS

## 2023-12-01 MED ORDER — OXYTOCIN-SODIUM CHLORIDE 30-0.9 UT/500ML-% IV SOLN
INTRAVENOUS | Status: DC | PRN
  Administered 2023-12-01: 500 mL via INTRAVENOUS

## 2023-12-01 MED ORDER — LABETALOL HCL 5 MG/ML IV SOLN: 40.0000 mg | INTRAVENOUS | Status: DC | PRN

## 2023-12-01 MED ORDER — DIPHENHYDRAMINE HCL 25 MG PO CAPS: 25.0000 mg | ORAL_CAPSULE | ORAL | Status: DC | PRN

## 2023-12-01 MED ORDER — SOD CITRATE-CITRIC ACID 500-334 MG/5ML PO SOLN
30.0000 mL | Freq: Once | ORAL | Status: AC
  Administered 2023-12-01: 30 mL via ORAL
  Filled 2023-12-01: qty 30

## 2023-12-01 MED ORDER — MAGNESIUM SULFATE 40 GM/1000ML IV SOLN
2.0000 g/h | INTRAVENOUS | Status: AC
  Administered 2023-12-02 (×2): 2 g/h via INTRAVENOUS
  Filled 2023-12-01: qty 1000

## 2023-12-01 MED ORDER — TRANEXAMIC ACID-NACL 1000-0.7 MG/100ML-% IV SOLN
INTRAVENOUS | Status: DC | PRN
  Administered 2023-12-01: 1000 mg via INTRAVENOUS

## 2023-12-01 MED ORDER — DIBUCAINE (PERIANAL) 1 % EX OINT: 1.0000 | TOPICAL_OINTMENT | CUTANEOUS | Status: DC | PRN

## 2023-12-01 MED ORDER — CEFAZOLIN SODIUM-DEXTROSE 3-4 GM/150ML-% IV SOLN
3.0000 g | INTRAVENOUS | Status: DC
  Administered 2023-12-01: 3 g via INTRAVENOUS
  Filled 2023-12-01: qty 150

## 2023-12-01 MED ORDER — MORPHINE SULFATE (PF) 0.5 MG/ML IJ SOLN
INTRAMUSCULAR | Status: AC
Start: 2023-12-01 — End: ?
  Filled 2023-12-01: qty 10

## 2023-12-01 MED ORDER — FAMOTIDINE 20 MG PO TABS
20.0000 mg | ORAL_TABLET | Freq: Once | ORAL | Status: AC
  Administered 2023-12-01: 20 mg via ORAL
  Filled 2023-12-01: qty 1

## 2023-12-01 MED ORDER — MEASLES, MUMPS & RUBELLA VAC IJ SOLR: 0.5000 mL | Freq: Once | INTRAMUSCULAR | Status: DC

## 2023-12-01 MED ORDER — ACETAMINOPHEN 10 MG/ML IV SOLN
INTRAVENOUS | Status: DC | PRN
Start: 2023-12-01 — End: 2023-12-01
  Administered 2023-12-01: 1000 mg via INTRAVENOUS

## 2023-12-01 MED ORDER — PROCHLORPERAZINE EDISYLATE 10 MG/2ML IJ SOLN
10.0000 mg | Freq: Once | INTRAMUSCULAR | Status: AC
  Administered 2023-12-01: 10 mg via INTRAVENOUS
  Filled 2023-12-01: qty 2

## 2023-12-01 MED ORDER — ENOXAPARIN SODIUM 100 MG/ML IJ SOSY
90.0000 mg | PREFILLED_SYRINGE | INTRAMUSCULAR | Status: DC
  Administered 2023-12-02 – 2023-12-04 (×3): 90 mg via SUBCUTANEOUS
  Filled 2023-12-01 (×4): qty 0.9

## 2023-12-01 MED ORDER — HYDROMORPHONE HCL 1 MG/ML IJ SOLN
0.2500 mg | INTRAMUSCULAR | Status: DC | PRN
  Administered 2023-12-01 (×2): 0.5 mg via INTRAVENOUS

## 2023-12-01 MED ORDER — ACETAMINOPHEN 500 MG PO TABS
1000.0000 mg | ORAL_TABLET | Freq: Four times a day (QID) | ORAL | Status: DC
  Administered 2023-12-01 – 2023-12-04 (×7): 1000 mg via ORAL
  Filled 2023-12-01 (×7): qty 2

## 2023-12-01 MED ORDER — HYDROMORPHONE HCL 1 MG/ML IJ SOLN
INTRAMUSCULAR | Status: AC
Start: 2023-12-01 — End: ?
  Filled 2023-12-01: qty 0.5

## 2023-12-01 MED ORDER — DIPHENHYDRAMINE HCL 25 MG PO CAPS: 25.0000 mg | ORAL_CAPSULE | Freq: Four times a day (QID) | ORAL | Status: DC | PRN

## 2023-12-01 MED ORDER — CHLORHEXIDINE GLUCONATE 0.12 % MT SOLN
15.0000 mL | Freq: Once | OROMUCOSAL | Status: AC
  Administered 2023-12-01: 15 mL via OROMUCOSAL
  Filled 2023-12-01: qty 15

## 2023-12-01 MED ORDER — FENTANYL CITRATE (PF) 100 MCG/2ML IJ SOLN
50.0000 ug | Freq: Once | INTRAMUSCULAR | Status: AC
  Administered 2023-12-01: 50 ug via INTRAVENOUS
  Filled 2023-12-01: qty 2

## 2023-12-01 MED ORDER — LABETALOL HCL 5 MG/ML IV SOLN: 80.0000 mg | INTRAVENOUS | Status: DC | PRN

## 2023-12-01 MED ORDER — ONDANSETRON HCL 4 MG/2ML IJ SOLN
INTRAMUSCULAR | Status: DC | PRN
  Administered 2023-12-01: 4 mg via INTRAVENOUS

## 2023-12-01 MED ORDER — OXYCODONE HCL 5 MG PO TABS
5.0000 mg | ORAL_TABLET | ORAL | Status: DC | PRN
  Administered 2023-12-01 – 2023-12-02 (×3): 5 mg via ORAL
  Filled 2023-12-01 (×3): qty 1

## 2023-12-01 MED ORDER — LABETALOL HCL 5 MG/ML IV SOLN: 20.0000 mg | INTRAVENOUS | Status: DC | PRN

## 2023-12-01 MED ORDER — ACETAMINOPHEN-CAFFEINE 500-65 MG PO TABS: 2.0000 | ORAL_TABLET | Freq: Once | ORAL | Status: DC

## 2023-12-01 MED ORDER — SIMETHICONE 80 MG PO CHEW
80.0000 mg | CHEWABLE_TABLET | Freq: Three times a day (TID) | ORAL | Status: DC
  Administered 2023-12-02 – 2023-12-04 (×8): 80 mg via ORAL
  Filled 2023-12-01 (×8): qty 1

## 2023-12-01 MED ORDER — DIPHENHYDRAMINE HCL 50 MG/ML IJ SOLN
25.0000 mg | Freq: Once | INTRAMUSCULAR | Status: AC
  Administered 2023-12-01: 25 mg via INTRAVENOUS
  Filled 2023-12-01: qty 1

## 2023-12-01 MED ORDER — MAGNESIUM SULFATE BOLUS VIA INFUSION
4.0000 g | Freq: Once | INTRAVENOUS | Status: AC
  Administered 2023-12-01: 4 g via INTRAVENOUS
  Filled 2023-12-01: qty 1000

## 2023-12-01 MED ORDER — FAMOTIDINE IN NACL 20-0.9 MG/50ML-% IV SOLN: 20.0000 mg | Freq: Once | INTRAVENOUS | Status: DC

## 2023-12-01 MED ORDER — SODIUM CHLORIDE 0.9 % IV SOLN
INTRAVENOUS | Status: AC
  Filled 2023-12-01: qty 3

## 2023-12-01 MED ORDER — ORAL CARE MOUTH RINSE: 15.0000 mL | Freq: Once | OROMUCOSAL | Status: AC

## 2023-12-01 MED ORDER — HYDROMORPHONE HCL 1 MG/ML IJ SOLN
0.2500 mg | INTRAMUSCULAR | Status: DC | PRN
Start: 2023-12-01 — End: 2023-12-01
  Administered 2023-12-01 (×4): 0.5 mg via INTRAVENOUS

## 2023-12-01 MED ORDER — SCOPOLAMINE 1 MG/3DAYS TD PT72
1.0000 | MEDICATED_PATCH | Freq: Once | TRANSDERMAL | Status: DC
Start: 2023-12-01 — End: 2023-12-04
  Filled 2023-12-01: qty 1

## 2023-12-01 MED ORDER — COCONUT OIL OIL
1.0000 | TOPICAL_OIL | Status: DC | PRN
  Administered 2023-12-02: 1 via TOPICAL

## 2023-12-01 MED ORDER — MORPHINE SULFATE (PF) 0.5 MG/ML IJ SOLN
INTRAMUSCULAR | Status: DC | PRN
  Administered 2023-12-01: .15 mg via INTRATHECAL

## 2023-12-01 MED ORDER — FENTANYL CITRATE (PF) 100 MCG/2ML IJ SOLN
INTRAMUSCULAR | Status: DC | PRN
  Administered 2023-12-01: 15 ug via INTRATHECAL

## 2023-12-01 MED ORDER — ROCURONIUM BROMIDE 100 MG/10ML IV SOLN
INTRAVENOUS | Status: DC | PRN
  Administered 2023-12-01: 20 mg via INTRAVENOUS

## 2023-12-01 MED ORDER — NALOXONE HCL 4 MG/10ML IJ SOLN: 1.0000 ug/kg/h | INTRAVENOUS | Status: DC | PRN

## 2023-12-01 MED ORDER — SUGAMMADEX SODIUM 200 MG/2ML IV SOLN
INTRAVENOUS | Status: DC | PRN
  Administered 2023-12-01: 400 mg via INTRAVENOUS

## 2023-12-01 MED ORDER — ACETAMINOPHEN 10 MG/ML IV SOLN
INTRAVENOUS | Status: AC
  Filled 2023-12-01: qty 200

## 2023-12-01 MED ORDER — MENTHOL 3 MG MT LOZG: 1.0000 | LOZENGE | OROMUCOSAL | Status: DC | PRN

## 2023-12-01 MED ORDER — NALOXONE HCL 0.4 MG/ML IJ SOLN: 0.4000 mg | INTRAMUSCULAR | Status: DC | PRN

## 2023-12-01 MED ORDER — ZOLPIDEM TARTRATE 5 MG PO TABS: 5.0000 mg | ORAL_TABLET | Freq: Every evening | ORAL | Status: DC | PRN

## 2023-12-01 MED ORDER — ONDANSETRON HCL 4 MG/2ML IJ SOLN: 4.0000 mg | Freq: Three times a day (TID) | INTRAMUSCULAR | Status: DC | PRN

## 2023-12-01 MED ORDER — LACTATED RINGERS IV SOLN: Freq: Once | INTRAVENOUS | Status: AC

## 2023-12-01 MED ORDER — DEXAMETHASONE SODIUM PHOSPHATE 10 MG/ML IJ SOLN
INTRAMUSCULAR | Status: AC
  Filled 2023-12-01: qty 1

## 2023-12-01 MED ORDER — CYCLOBENZAPRINE HCL 5 MG PO TABS
10.0000 mg | ORAL_TABLET | Freq: Once | ORAL | Status: AC
  Administered 2023-12-01: 10 mg via ORAL
  Filled 2023-12-01: qty 2

## 2023-12-01 SURGICAL SUPPLY — 39 items
BENZOIN TINCTURE PRP APPL 2/3 (GAUZE/BANDAGES/DRESSINGS) IMPLANT
CANISTER PREVENA PLUS 150 (CANNISTER) IMPLANT
CHLORAPREP W/TINT 26ML (MISCELLANEOUS) ×2 IMPLANT
CLAMP UMBILICAL CORD (MISCELLANEOUS) ×1 IMPLANT
CLOTH BEACON ORANGE TIMEOUT ST (SAFETY) ×1 IMPLANT
DERMABOND ADVANCED .7 DNX12 (GAUZE/BANDAGES/DRESSINGS) ×2 IMPLANT
DISSECTOR SURG LIGASURE 21 (MISCELLANEOUS) ×1 IMPLANT
DRESSING PREVENA PLUS CUSTOM (GAUZE/BANDAGES/DRESSINGS) IMPLANT
DRSG OPSITE POSTOP 4X10 (GAUZE/BANDAGES/DRESSINGS) ×1 IMPLANT
DRSG PREVENA PLUS CUSTOM (GAUZE/BANDAGES/DRESSINGS) ×1
ELECT REM PT RETURN 9FT ADLT (ELECTROSURGICAL) ×1
ELECTRODE REM PT RTRN 9FT ADLT (ELECTROSURGICAL) ×1 IMPLANT
EXTRACTOR VACUUM KIWI (MISCELLANEOUS) IMPLANT
GAUZE SPONGE 4X4 12PLY STRL LF (GAUZE/BANDAGES/DRESSINGS) IMPLANT
GLOVE BIOGEL PI IND STRL 7.0 (GLOVE) ×1 IMPLANT
GLOVE ECLIPSE 7.5 STRL STRAW (GLOVE) ×1 IMPLANT
GOWN STRL REUS W/TWL LRG LVL3 (GOWN DISPOSABLE) ×3 IMPLANT
KIT ABG SYR 3ML LUER SLIP (SYRINGE) IMPLANT
NDL HYPO 25X5/8 SAFETYGLIDE (NEEDLE) IMPLANT
NEEDLE HYPO 25X5/8 SAFETYGLIDE (NEEDLE) IMPLANT
NS IRRIG 1000ML POUR BTL (IV SOLUTION) ×1 IMPLANT
PACK C SECTION WH (CUSTOM PROCEDURE TRAY) ×1 IMPLANT
PAD OB MATERNITY 4.3X12.25 (PERSONAL CARE ITEMS) ×1 IMPLANT
PENCIL SMOKE EVAC W/HOLSTER (ELECTROSURGICAL) ×1 IMPLANT
RETRACTOR TRAXI PANNICULUS (MISCELLANEOUS) IMPLANT
RTRCTR C-SECT PINK 25CM LRG (MISCELLANEOUS) ×1 IMPLANT
SUT MNCRL 0 VIOLET CTX 36 (SUTURE) ×2 IMPLANT
SUT MNCRL AB 3-0 PS2 27 (SUTURE) ×1 IMPLANT
SUT MON AB 4-0 PS1 27 (SUTURE) ×1 IMPLANT
SUT PLAIN 0 NONE (SUTURE) IMPLANT
SUT PLAIN ABS 2-0 54XMFL TIE (SUTURE) ×1 IMPLANT
SUT VIC AB 0 CT1 27XBRD ANBCTR (SUTURE) ×1 IMPLANT
SUT VIC AB 2-0 CT1 TAPERPNT 27 (SUTURE) ×1 IMPLANT
SUT VIC AB 4-0 KS 27 (SUTURE) ×1 IMPLANT
SUT VIC AB 4-0 PS2 27 (SUTURE) ×1 IMPLANT
SUTURE PLAIN GUT 2.0 ETHICON (SUTURE) IMPLANT
TOWEL OR 17X24 6PK STRL BLUE (TOWEL DISPOSABLE) ×1 IMPLANT
TRAY FOLEY W/BAG SLVR 14FR LF (SET/KITS/TRAYS/PACK) ×1 IMPLANT
WATER STERILE IRR 1000ML POUR (IV SOLUTION) ×1 IMPLANT

## 2023-12-01 NOTE — MAU Note (Signed)
Noted on pump, Magnesium was infusing at the maintenance rate of 2gr/50cc/hr. Called in [place], [place], to confirm and bolus of 4gm re-entered and started at this time.

## 2023-12-01 NOTE — Op Note (Signed)
Cesarean Section Operative Report  Maureen Lewis  12/01/2023  Indications: severe preeclampsia, history prior cesarean, maternal request, undesired fertility  Pre-operative Diagnosis: repeat low transverse cesarean section, bilateral salpingectomy  Post-operative Diagnosis: Same   Surgeon: Surgeons and Role:    * Illeana Edick, Wilfred Curtis, MD - Primary    * Wyn Forster, MD - Assisting   Attending Attestation: I was present and scrubbed for the entire procedure.   An experienced assistant was required given the standard of surgical care given the complexity of the case.  This assistant was needed for exposure, dissection, suctioning, retraction, instrument exchange, assisting with delivery with administration of fundal pressure, and for overall help during the procedure.  Anesthesia: spinal which was found to be inadequate so converted to general   quantified Blood Loss: 1074 ml (though this seems to be an over-estimate)  Total IV Fluids: 1100 ml LR  Urine Output:: 100 ml clear yellow urine  Specimens: none  Findings: Viable female infant in cephalic presentation; Apgars pending; weight pending; arterial cord pH not obtained;  clear amniotic fluid; intact placenta with three vessel cord; normal uterus, fallopian tubes and ovaries bilaterally.  Baby condition / location:  Nursery   Complications: no complications. No significant adhesive disease.  Indications: Maureen Lewis is a 28 y.o. [phone] with an IUP [redacted]w[redacted]d presenting with headache refractory to various interventions, also with elevated blood pressure, so diagnosis made of chronic hypertension with superimposed severe preeclampsia. Declined TOLAC.  The risks, benefits, complications, treatment options, and exected outcomes were discussed with the patient . The patient dwith the proposed plan, giving informed consent. identified as Maureen Lewis and the procedure verified as C-Section  Delivery.  Procedure Details:  The patient was taken back to the operative suite where spinal anesthesia was placed.  A time out was held and the above information confirmed.   After induction of anesthesia, the patient was draped and prepped in the usual sterile manner and placed in a dorsal supine position with a leftward tilt. Anesthesia found to be inadequate, so after discussion between patient and anesthesiologist, decision made to convert to general anesthesia. A Pfannenstiel incision was made and carried down through the subcutaneous tissue to the fascia. Fascial incision was made and bluntly extended transversely. The fascia was separated from the underlying rectus tissue superiorly and inferiorly. The peritoneum was identified and sharply entered and extended longitudinally. Alexiws retractor was placed. A bladder flap was not created. A low transverse uterine incision was made and extended bluntly. Delivered from cephalic presentation with vacuum-assistance (one pull, no pop-offs) was a viable infant with Apgars and weight as above.  After waiting 60 seconds for delayed cord cutting, the umbilical cord was clamped and cut cord blood was obtained for evaluation. Cord ph was not sent. The placenta was removed Intact and appeared normal. The uterine outline, tubes and ovaries appeared normal. The uterine incision was closed with running unlocked sutures 0-Vicryl in one layer.   Hemostasis was observed. Attention was then turned to the left fallopian tube. Kelly forceps were placed on the mesosalpinx underneath most of the tube.  This pedicle was double suture ligated with 2-0 plain gut, and the tube including the fimbriated end was excised.  The right fallopian tube was then identified, doubly ligated, and was excised in a similar fashion allowing for bilateral tubal sterilization via bilateral salpingectomy.  Those sutures were found to be too loose and so two additional 2-0 plain gut sutures were  placed. An edge of fallopian tube that was adhered to the ovary was found to be bleeding and so one 2-0 plain gut suture was placed beneath the bleeding portion to achieve hemostasis. Good hemostasis was noted overall.  The peritoneum was not closed. The rectus muscles were examined and hemostasis observed. The fascia was then reapproximated with running sutures of 0-Vicryl. The subcuticular closure was performed with 2-0 plain gut. The skin was closed with 4-0 Vicryl. Prevena wound vac was applied.  Instrument, sponge, and needle counts were correct prior the abdominal closure and were correct at the conclusion of the case.     Disposition: PACU - hemodynamically stable.   Maternal Condition: stable       Signed: Silvano Bilis, MD 12/01/2023 2:18 PM

## 2023-12-01 NOTE — MAU Note (Addendum)
.  Antionette Wendi Maya Kirchhoff is a 28 y.o. at [redacted]w[redacted]d here in MAU reporting: pressure and ctx 4-5 minutes for the past hour. Denies VB or LOF. Reports DFM since ctx started - feeling movement now in triage, but states had not previously felt movement since 2330. Hx of c/s - scheduled for repeat c/s with tubal ligation on 2/18. CHTN - reports HA; denies visual changes or epigastric pain. Denies being on blood pressure medications.   Onset of complaint: 0030 Pain score: 7 - ctx; 9 - pelvic pressure; 10 - HA  Vitals:   12/01/23 0217  BP: (!) 150/66  Pulse: 91  Resp: 19  Temp: 98.2 F (36.8 C)  SpO2: 98%     FHT: 154  Lab orders placed from triage: labor eval

## 2023-12-01 NOTE — H&P (Addendum)
Obstetric Preoperative History and Physical  Maureen Lewis is a 28 y.o. [phone] with IUP at [redacted]w[redacted]d presenting for presenting to MAU with contractions and HA. Diagnosed with Preeclampsia with Severe Features (Headache).  Noted to have mild range BP in the MAU. Of note was diagnosed with cHTN but at the initiation of PNC BPs were 120-130/80s with some in the 110s/60s. Additionally her documented BP at 37w1 was 136/81. She received extensive treatment in the MAU for HA - Reglan, Excedrin Tension, Benadryl, Fentanyl, Magnesium, and compazine without relief.   Prenatal Course Source of Care: [facility] with CNM  with onset of care at 7 weeks Pregnancy complications or risks: Patient Active Problem List   Diagnosis Date Noted   History of cesarean section 11/11/2023   Iron deficiency anemia 08/10/2023   Chronic hypertension affecting pregnancy 07/20/2023   Headache in pregnancy 06/05/2023   Abnormal Pap smear of cervix 05/13/2023   Supervision of high-risk pregnancy 04/30/2023   History of gestational diabetes mellitus (GDM) in prior pregnancy, currently pregnant 04/30/2023   Obesity in pregnancy 04/30/2023   S/P laparoscopic sleeve gastrectomy 05/20/2022   Idiopathic intracranial hypertension 06/13/2021   Alpha thalassemia trait    Vitamin D deficiency 09/16/2017   PCOS (polycystic ovarian syndrome) 08/13/2017   OSA (obstructive sleep apnea) 08/28/2016   She plans to breastfeed She desires bilateral tubal ligation for postpartum contraception.  Plans on circumcision for son, consented by Dr Alvester Morin in MAU  Prenatal labs and studies: ABO, Rh: A/Positive/-- (07/18 1228) Antibody: Negative (07/18 1228) Rubella: 16.80 (07/18 1228) RPR: Non Reactive (12/09 0818)  HBsAg: Negative (07/18 1228)  HIV: Non Reactive (12/09 0818)  YNW:GNFAOZHY/-- (02/05 0516) 2 hr Glucola  passed Genetic screening normal Anatomy US normal   Prenatal Transfer Tool  Maternal Diabetes: No Genetic  Screening: Normal Maternal Ultrasounds/Referrals: Normal Fetal Ultrasounds or other Referrals:  None Maternal Substance Abuse:  No Significant Maternal Medications:  None Significant Maternal Lab Results: Group B Strep negative Number of Prenatal Visits:greater than 3 verified prenatal visits Maternal Vaccinations:RSV: Given during pregnancy >/=14 days ago and TDap. RSV received on 11/04/23 Other Comments:   On ASA for cHTN   Past Medical History:  Diagnosis Date   Abnormal uterine bleeding    Acid reflux    Alpha thalassemia trait    Amenorrhea    Anemia    no current med.   Asthma    Back pain    Chest pain    resolved   Complication of anesthesia    states had to keep giving her anesthesia during EGD   Constipation    Depression    "I'm good"   Fibromyalgia    Finger mass, left 03/2017   middle finger   Gestational hypertension 09/23/2019   Guidelines for Antenatal Testing and Sonography  (with updated ICD-10 codes)  Updated  09/25/19 with Dr. Noralee Space  INDICATION U/S 2 X week NST/AFI  or full BPP wkly DELIVERY Diabetes   A1 - good control - O24.410    A2 - good control - O24.419      A2  - poor control or poor compliance - O24.419, E11.65   (Macrosomia or polyhydramnios) **E11.65 is extra code for poor control**    A2/B - O24.   Headache    Hepatitis 07/18/2022   Hepatitis B   History of kidney stones    Hypertension    Irritable bowel syndrome (IBS)    Joint pain    Morbid obesity with  body mass index (BMI) of 45.0 to 49.9 in adult [facility])    Plantar fasciitis, bilateral    resolved   Polycystic ovary syndrome    Pre-diabetes    no meds, diet controlled, diet not check blood sugar   Prediabetes 09/16/2017   Early 1hr 70     Shortness of breath    albuterol inhaler   Sleep apnea    no CPAP use   Vertigo 2017    Past Surgical History:  Procedure Laterality Date   CESAREAN SECTION N/A 10/09/2019   Procedure: CESAREAN SECTION;  Surgeon: Westminster Bing,  MD;  Location: [facility];  Service: Obstetrics;  Laterality: N/A;   COLONOSCOPY WITH PROPOFOL  10/07/2016   DILATION AND CURETTAGE OF UTERUS     ESOPHAGOGASTRODUODENOSCOPY  12/21/2015   EXCISION MASS UPPER EXTREMETIES Left 04/21/2017   Procedure: EXCISION MASS LEFT MIDDLE FINGER;  Surgeon: Cindee Salt, MD;  Location: [facility];  Service: Orthopedics;  Laterality: Left;   gastric sleeve  05/09/2022   IR FL GUIDED LOC OF NEEDLE/CATH TIP FOR SPINAL INJECTION RT  03/13/2020   KNEE ARTHROSCOPY WITH LATERAL MENISECTOMY Left 10/31/2022   Procedure: KNEE ARTHROSCOPY WITH PARTIAL LATERAL MENISECTOMY;  Surgeon: Yolonda Kida, MD;  Location: [facility];  Service: Orthopedics;  Laterality: Left;  75   KNEE ARTHROSCOPY WITH LATERAL RELEASE Left 10/31/2022   Procedure: KNEE ARTHROSCOPY WITH LATERAL RELEASE AND LIMITED SYNOVECTOMY;  Surgeon: Yolonda Kida, MD;  Location: [facility];  Service: Orthopedics;  Laterality: Left;  75   PHOTOCOAGULATION WITH LASER Left 12/13/2020   Procedure: RETINOPLEXY LEFT EYE;  Surgeon: Stephannie Li, MD;  Location: [facility] OR;  Service: Ophthalmology;  Laterality: Left;   SCLERAL BUCKLE Right 12/13/2020   Procedure: SCLERAL BUCKLE WITH CRYO;  Surgeon: Stephannie Li, MD;  Location: [facility] OR;  Service: Ophthalmology;  Laterality: Right;    OB History  Gravida Para Term Preterm AB Living  4 1 1  0 2 1  SAB IAB Ectopic Multiple Live Births  2 0 0 0 1    # Outcome Date GA Lbr Len/2nd Weight Sex Type Anes PTL Lv  4 Current           3 SAB 2023             Birth Comments: had D&C  2 SAB 2021          1 Term 10/09/19 [redacted]w[redacted]d  3370 g F CS-Vac EPI  LIV     Birth Comments: GHTN, GDM, IOL for GHTN, C/S for decels    Social History   Socioeconomic History   Marital status: Single    Spouse name: Not on file   Number of children: Not on file   Years of education: Not on file   Highest education level: Not on file  Occupational History   Occupation: Hydrographic surveyor: [id number]  Tobacco Use   Smoking status: Never   Smokeless tobacco: Never  Vaping Use   Vaping status: Never Used  Substance and Sexual Activity   Alcohol use: Not Currently    Comment: social    Drug use: No   Sexual activity: Not Currently    Birth control/protection: None  Other Topics Concern   Not on file  Social History Narrative   Right handed    Soda sometimes   Lives with mom and cousin, a grandmother in [place] also helps care for her.       She is  in nursing school.    Social Drivers of Health   Financial Resource Strain: High Risk (11/05/2022)   Received from [facility]   Overall Financial Resource Strain (CARDIA)    Difficulty of Paying Living Expenses: Hard  Food Insecurity: No Food Insecurity (05/07/2023)   Hunger Vital Sign    Worried About Running Out of Food in the Last Year: Never true    Ran Out of Food in the Last Year: Never true  Transportation Needs: No Transportation Needs (05/07/2023)   PRAPARE - Administrator, Civil Service (Medical): No    Lack of Transportation (Non-Medical): No  Physical Activity: Unknown (11/05/2022)   [facility]   Exercise Vital Sign    Days of Exercise per Week: 0 days    Minutes of Exercise per Session: Not on file  Recent Concern: Physical Activity - Inactive (11/05/2022)   Received from [facility]   Exercise Vital Sign    Days of Exercise per Week: 0 days    Minutes of Exercise per Session: 40 min  Stress: Stress Concern Present (11/05/2022)   Received from [facility] - Occupational Stress Questionnaire    Feeling of Stress : Rather much  Social Connections: Somewhat Isolated (11/05/2022)   [facility] from [facility]   Social Network    How would you rate your social network (family, work, friends)?: Restricted participation with some degree of social  isolation    Family History  Problem Relation Age of Onset   Diabetes Maternal Aunt    Hypertension Maternal Uncle    Depression Maternal Grandfather    Diabetes Maternal Grandfather    Hypertension Maternal Grandfather    Arthritis Mother    High blood pressure Mother    Depression Mother    Sleep apnea Mother    Obesity Mother    Diabetes Mother    Hypertension Mother    Kidney failure Mother    Sleep apnea Father    Obesity Father    Healthy Daughter     Medications Prior to Admission  Medication Sig Dispense Refill Last Dose/Taking   aspirin EC 81 MG tablet Take 2 tablets (162 mg total) by mouth daily. Swallow whole. 180 tablet 12 11/30/2023   Magnesium Oxide -Mg Supplement (MAG-OXIDE) 200 MG TABS Take 2 tablets (400 mg total) by mouth at bedtime. If that amount causes loose stools in the am, switch to 200mg  daily at bedtime. 60 tablet 3 11/30/2023   Prenatal Vit-Fe Phos-FA-Omega (VITAFOL GUMMIES) 3.33-0.333-34.8 MG CHEW Chew 1 tablet by mouth daily. 90 tablet 3 11/30/2023   Vitamin D, Ergocalciferol, (DRISDOL) 1.25 MG (50000 UNIT) CAPS capsule Take 1 capsule (50,000 Units total) by mouth every 7 (seven) days. 10 capsule 0 11/30/2023   albuterol (VENTOLIN HFA) 108 (90 Base) MCG/ACT inhaler Inhale 1 puff into the lungs every 4 (four) hours as needed for shortness of breath. (Patient not taking: Reported on 11/25/2023)      Blood Pressure Monitoring (BLOOD PRESSURE KIT) DEVI 1 Device by Does not apply route as needed. 1 each 0     Allergies  Allergen Reactions   Bee Venom Hives, Shortness Of Breath and Swelling   Hydrocodone-Acetaminophen Anaphylaxis    No problem when she takes Tylenol   Peach Flavoring Agent (Non-Screening) Hives, Shortness Of Breath and Other (See Comments)    SWELLING OF MOUTH   Pollen Extract Hives, Shortness Of  Breath and Swelling   Remdesivir Swelling    Angioedema 8/25   Latex Rash   Cortisone Itching and Swelling   Cortizone-10 [Hydrocortisone]      Swelling and itching   Toradol [Ketorolac Tromethamine] Swelling    Review of Systems: Negative except for what is mentioned in HPI.  Physical Exam: BP 131/76   Pulse 85   Temp 98.2 F (36.8 C) (Oral)   Resp 19   Ht 6' 2.5" (1.892 m)   Wt (!) 179.7 kg   LMP 03/07/2023 Comment: irreg bleeding, neg blood test 5/19  SpO2 97%   BMI 50.19 kg/m  FHR by EFM: 140 bpm CONSTITUTIONAL: Well-developed, well-nourished female in no acute distress.  HENT:  Normocephalic, atraumatic, External right and left ear normal. Oropharynx is clear and moist EYES: Conjunctivae and EOM are normal. Pupils are equal, round, and reactive to light. No scleral icterus.  NECK: Normal range of motion, supple, no masses SKIN: Skin is warm and dry. No rash noted. Not diaphoretic. No erythema. No pallor. NEUROLGIC: Alert and oriented to person, place, and time. Normal reflexes, muscle tone coordination. No cranial nerve deficit noted. PSYCHIATRIC: Normal mood and affect. Normal behavior. Normal judgment and thought content. CARDIOVASCULAR: Normal heart rate noted, regular rhythm RESPIRATORY: Effort and breath sounds normal, no problems with respiration noted ABDOMEN: Soft, nontender, nondistended, gravid. Well-healed Pfannenstiel incision. PELVIC: Deferred MUSCULOSKELETAL: Normal range of motion. No edema and no tenderness. 2+ distal pulses.   Pertinent Labs/Studies:   Results for orders placed or performed during the hospital encounter of 12/01/23 (from the past 72 hours)  Urinalysis, Routine w reflex microscopic -Urine, Clean Catch     Status: None   Collection Time: 12/01/23  2:27 AM  Result Value Ref Range   Color, Urine YELLOW YELLOW   APPearance CLEAR CLEAR   Specific Gravity, Urine 1.012 1.005 - 1.030   pH 7.0 5.0 - 8.0   Glucose, UA NEGATIVE NEGATIVE mg/dL   Hgb urine dipstick NEGATIVE NEGATIVE   Bilirubin Urine NEGATIVE NEGATIVE   Ketones, ur NEGATIVE NEGATIVE mg/dL   Protein, ur NEGATIVE NEGATIVE  mg/dL   Nitrite NEGATIVE NEGATIVE   Leukocytes,Ua NEGATIVE NEGATIVE    Comment: Performed at [facility], [address], [place], [place] [address]  Protein / creatinine ratio, urine     Status: None   Collection Time: 12/01/23  2:27 AM  Result Value Ref Range   Creatinine, Urine 70 mg/dL   Total Protein, Urine 7 mg/dL    Comment: NO NORMAL RANGE ESTABLISHED FOR THIS TEST   Protein Creatinine Ratio 0.10 0.00 - 0.15 mg/mg[Cre]    Comment: Performed at [facility], [address] [place], [place] [address]  CBC     Status: Abnormal   Collection Time: 12/01/23  2:45 AM  Result Value Ref Range   WBC 6.0 4.0 - 10.5 K/uL   RBC 4.63 3.87 - 5.11 MIL/uL   Hemoglobin 11.3 (L) 12.0 - 15.0 g/dL   HCT 09.8 (L) 11.9 - 14.7 %   MCV 76.0 (L) 80.0 - 100.0 fL   MCH 24.4 (L) 26.0 - 34.0 pg   MCHC 32.1 30.0 - 36.0 g/dL   RDW 82.9 56.2 - 13.0 %   Platelets 201 150 - 400 K/uL   nRBC 0.0 0.0 - 0.2 %    Comment: Performed at [facility], [address], [place], [place] [address]  Comprehensive metabolic panel     Status: Abnormal  Collection Time: 12/01/23  2:45 AM  Result Value Ref Range   Sodium 137 135 - 145 mmol/L   Potassium 3.9 3.5 - 5.1 mmol/L   Chloride 105 98 - 111 mmol/L   CO2 23 22 - 32 mmol/L   Glucose, Bld 93 70 - 99 mg/dL    Comment: Glucose reference range applies only to samples taken after fasting for at least 8 hours.   BUN 5 (L) 6 - 20 mg/dL   Creatinine, Ser 4.09 0.44 - 1.00 mg/dL   Calcium 8.9 8.9 - 81.1 mg/dL   Total Protein 6.5 6.5 - 8.1 g/dL   Albumin 2.8 (L) 3.5 - 5.0 g/dL   AST 14 (L) 15 - 41 U/L   ALT 13 0 - 44 U/L   Alkaline Phosphatase 119 38 - 126 U/L   Total Bilirubin 0.6 0.0 - 1.2 mg/dL   GFR, Estimated >91 >47 mL/min    Comment: (NOTE) Calculated using the CKD-EPI Creatinine Equation (2021)    Anion gap 9 5 - 15    Comment: Performed at [facility], [address] [place], [place] [address]  Sample to Blood Bank     Status:  None   Collection Time: 12/01/23  2:55 AM  Result Value Ref Range   Blood Bank Specimen SAMPLE AVAILABLE FOR TESTING    Sample Expiration      12/04/2023,2359 Performed at [facility], [address], [place], [place] [address]    *Note: Due to a large number of results and/or encounters for the requested time period, some results have not been displayed. A complete set of results can be found in Results Review.    Assessment and Plan :Maureen Lewis is a 29 y.o. [phone] at [redacted]w[redacted]d being admitted being admitted for  cesarean section due PEC with severe features.   The risks of cesarean section discussed with the patient included but were not limited to: bleeding which may require transfusion or reoperation; infection which may require antibiotics; injury to bowel, bladder, ureters or other surrounding organs; injury to the fetus; need for additional procedures including hysterectomy in the event of a life-threatening hemorrhage; placental abnormalities wth subsequent pregnancies, incisional problems, thromboembolic phenomenon and other postoperative/anesthesia complications. The patient concurred with the proposed plan, giving informed written consent for the procedure.   She expressed that she strongly desire permanent sterilization. She declines all other modalities. We discussed method of sterilization with the preferred method being salpingectomy. Risks of procedure discussed with patient including but not limited to: risk of regret, permanence of method, bleeding, infection, injury to surrounding organs and need for additional procedures.  Failure risk of 0.5-1% with increased risk of ectopic gestation if pregnancy occurs was also discussed with patient.    Patient has been NPO since last night she will remain NPO for procedure. Anesthesia and OR aware. Preoperative prophylactic antibiotics and SCDs ordered on call to the OR. To OR when ready.   Reviewed likely Provena  dressing for infection prevention (had this previously) Last ate at 9 PM 2/10 Received PO medication in MAU for HA  Declined TOLAC  #PEC with severe features (HA, did not respond to medication): Discussed with patient 24 hours of magnesium after delivery. Magnesium 4g bolus and 2g/hr ordered.   #Super morbid obesity: high risk for infection due to pannus, plan for provena dressing  #h/o gastric sleeve: Has lost weight due to this procedure. Last labs were in December-- notable for low Vitamin D  but did not have B12, ferritin, folate. Added on other vitamin labs  #Vitamin D Deficiency: Consider high dose replacement at discharge for 8 weeks.   Federico Flake, MD, MPH, ABFM Attending Physician Center for [facility]

## 2023-12-01 NOTE — Discharge Summary (Signed)
Postpartum Discharge Summary  Date of Service updated***     Patient Name: Maureen Lewis DOB: [birth year] MRN: [id number]  Date of admission: 12/01/2023 Delivery date:12/01/2023 Delivering provider: Shonna Chock [place] Date of discharge: 12/01/2023  Admitting diagnosis: Preeclampsia, third trimester [O14.93] Intrauterine pregnancy: [redacted]w[redacted]d     Secondary diagnosis:  Principal Problem:   Preeclampsia, third trimester Active Problems:   S/P cesarean section   History of bilateral salpingectomy  Additional problems: ***    Discharge diagnosis: Term Pregnancy Delivered, CHTN, and Obesity                                               Post partum procedures: Bilateral salpingectomy Augmentation: {Augmentation:20782} Complications: {OB Labor/Delivery Complications:20784}  Hospital course: Sceduled C/S   28 y.o. yo [id number] at [redacted]w[redacted]d was admitted to the hospital 12/01/2023 for scheduled cesarean section with the following indication:Elective Repeat.Delivery details are as follows:  Membrane Rupture Time/Date:  ,   Delivery Method:  Operative Delivery:Device used:Kiwi vacuum Indication: Fetal indications Details of operation can be found in separate operative note.  Patient had a postpartum course complicated by***.  She is ambulating, tolerating a regular diet, passing flatus, and urinating well. Patient is discharged home in stable condition on  12/01/23        Newborn Data: Birth date:12/01/2023 Birth time:1:37 PM Gender:Female Living status:Living Apgars:8 ,9  Weight:3670 g    Magnesium Sulfate received: Yes: Seizure prophylaxis BMZ received: No Rhophylac:N/A MMR:Yes T-DaP:Given prenatally Flu: Yes RSV Vaccine received: {RSV:31013} Transfusion:{Transfusion received:30440034}  Immunizations received: Immunization History  Administered Date(s) Administered   DTaP 09/17/1998, 11/23/1998, 01/23/1999, 11/06/1999   Dtap, Unspecified 03/10/2003   HIB, Unspecified  09/17/1998, 11/23/1998, 01/23/1999   HPV 9-valent 05/20/2012, 07/22/2012, 08/23/2013   HPV Quadrivalent 05/20/2012, 07/22/2012, 08/23/2013   Hep B, Unspecified 01/23/1999, 04/24/1999, 07/22/1999   Hepatitis A, Ped/Adol-2 Dose 04/03/2009, 05/30/2011   IPV 09/17/1998, 11/23/1998, 07/22/1999   Influenza, Seasonal, Injecte, Preservative Fre 07/13/2023   Influenza,inj,Quad PF,6+ Mos 08/23/2013, 07/18/2014, 07/23/2016, 08/25/2019, 09/07/2020, 07/15/2022   Influenza-Unspecified 07/30/2006   MMR 07/22/1999, 03/10/2003   Meningococcal Acwy, Unspecified 05/30/2011, 01/30/2016   Meningococcal B, OMV 01/30/2016, 03/13/2016   Novel Infuenza-h1n1-09 07/28/2008   PFIZER(Purple Top)SARS-COV-2 Vaccination 07/24/2020, 08/17/2020   PPD Test 11/14/2020, 07/15/2022   Polio, Unspecified 03/10/2003   Rsv, Bivalent, Protein Subunit Rsvpref,pf Verdis Frederickson) 11/04/2023   Tdap 07/12/2009, 08/25/2019, 10/22/2023   Varicella 07/06/2001, 07/12/2009    Physical exam  Vitals:   12/01/23 1200 12/01/23 1215 12/01/23 1230 12/01/23 1245  BP: 107/66 (!) 109/57 130/85 113/67  Pulse: 93 90 91 93  Resp: 20  20   Temp:      TempSrc:      SpO2: 98% 97% 96%   Weight:      Height:       General: {Exam; general:21111117} Lochia: {Desc; appropriate/inappropriate:30686::"appropriate"} Uterine Fundus: {Desc; firm/soft:30687} Incision: {Exam; incision:21111123} DVT Evaluation: {Exam; dvt:2111122} Labs: Lab Results  Component Value Date   WBC 6.0 12/01/2023   HGB 11.3 (L) 12/01/2023   HCT 35.2 (L) 12/01/2023   MCV 76.0 (L) 12/01/2023   PLT 201 12/01/2023      Latest Ref Rng & Units 12/01/2023    2:45 AM  CMP  Glucose 70 - 99 mg/dL 93   BUN 6 - 20 mg/dL 5   Creatinine 8.65 - 7.84 mg/dL 6.96   Sodium  135 - 145 mmol/L 137   Potassium 3.5 - 5.1 mmol/L 3.9   Chloride 98 - 111 mmol/L 105   CO2 22 - 32 mmol/L 23   Calcium 8.9 - 10.3 mg/dL 8.9   Total Protein 6.5 - 8.1 g/dL 6.5   Total Bilirubin 0.0 - 1.2 mg/dL 0.6    Alkaline Phos 38 - 126 U/L 119   AST 15 - 41 U/L 14   ALT 0 - 44 U/L 13    Edinburgh Score:    11/22/2019    9:07 AM  Edinburgh Postnatal Depression Scale Screening Tool  I have been able to laugh and see the funny side of things. 0  I have looked forward with enjoyment to things. 0  I have blamed myself unnecessarily when things went wrong. 0  I have been anxious or worried for no good reason. 0  I have felt scared or panicky for no good reason. 0  Things have been getting on top of me. 0  I have been so unhappy that I have had difficulty sleeping. 0  I have felt sad or miserable. 0  I have been so unhappy that I have been crying. 0  The thought of harming myself has occurred to me. 0  Edinburgh Postnatal Depression Scale Total 0   No data recorded  After visit meds:  Allergies as of 12/01/2023       Reactions   Bee Venom Hives, Shortness Of Breath, Swelling   Hydrocodone-acetaminophen Anaphylaxis   No problem when she takes Tylenol   Peach Flavoring Agent (non-screening) Hives, Shortness Of Breath, Other (See Comments)   SWELLING OF MOUTH   Pollen Extract Hives, Shortness Of Breath, Swelling   Remdesivir Swelling   Angioedema 8/25   Latex Rash   Cortisone Itching, Swelling   Cortizone-10 [hydrocortisone]    Swelling and itching   Toradol [ketorolac Tromethamine] Swelling     Med Rec must be completed prior to using this SMARTLINK***        Discharge home in stable condition Infant Feeding: Bottle Infant Disposition:{CHL IP OB HOME WITH UEAVWU:98119} Discharge instruction: per After Visit Summary and Postpartum booklet. Activity: Advance as tolerated. Pelvic rest for 6 weeks.  Diet: routine diet Future Appointments: Future Appointments  Date Time Provider Department Center  12/02/2023  4:15 PM Bernerd Limbo, CNM [facility]   Follow up Visit:  Follow-up Information     [facility] Follow up in 1  week(s).   Specialty: Obstetrics and Gynecology Contact information: [address] [place] [address] [phone]                 Please schedule this patient for a In person postpartum visit in 4 weeks with the following provider: Any provider. Additional Postpartum F/U:Postpartum Depression checkup, 2 hour GTT, Incision check 1 week, and BP check 1 week  High risk pregnancy complicated by: HTN Delivery mode:   rLTCS Anticipated Birth Control:   Tubal completed during section   12/01/2023 Wyn Forster, MD

## 2023-12-01 NOTE — MAU Provider Note (Addendum)
Chief Complaint:  Contractions, Decreased Fetal Movement, and Pelvic Pain   Event Date/Time   First Provider Initiated Contact with Patient 12/01/23 0246     HPI: Maureen Lewis is a 28 y.o. [id number] at 4w2dwho presents to maternity admissions reporting contractions and pressure, severe headache.  Has chronic hypertension (per prior note had HTN between pregnancies, patient states did not). .States headache has been there 3 days and Tylenol has not helped.  Has a history of migraines but states never this bad.  She reports good fetal movement, denies LOF, vaginal bleeding, dizziness, n/v, or fever/chills.  She denies visual changes or RUQ abdominal pain.  Headache  This is a new problem. The current episode started in the past 7 days. The problem occurs constantly. The problem has been unchanged. Associated symptoms include abdominal pain. Pertinent negatives include no back pain, fever, nausea, numbness or weakness. Nothing aggravates the symptoms. She has tried acetaminophen for the symptoms. The treatment provided no relief.  Abdominal Pain This is a new problem. The current episode started today. The quality of the pain is cramping. Associated symptoms include headaches. Pertinent negatives include no diarrhea, dysuria, fever or nausea.   RN Note: Maureen Lewis is a 28 y.o. at [redacted]w[redacted]d here in MAU reporting: pressure and ctx 4-5 minutes for the past hour. Denies VB or LOF. Reports DFM since ctx started - feeling movement now in triage, but states had not previously felt movement since 2330. Hx of c/s - scheduled for repeat c/s with tubal ligation on 2/18. CHTN - reports HA; denies visual changes or epigastric pain. Denies being on blood pressure medications.   Onset of complaint: 0030  Past Medical History: Past Medical History:  Diagnosis Date   Abnormal uterine bleeding    Acid reflux    Alpha thalassemia trait    Amenorrhea    Anemia    no current med.   Asthma     Back pain    Chest pain    resolved   Complication of anesthesia    states had to keep giving her anesthesia during EGD   Constipation    Depression    "I'm good"   Fibromyalgia    Finger mass, left 03/2017   middle finger   Gestational hypertension 09/23/2019   Guidelines for Antenatal Testing and Sonography  (with updated ICD-10 codes)  Updated  Sep 25, 2019 with Dr. Noralee Space  INDICATION U/S 2 X week NST/AFI  or full BPP wkly DELIVERY Diabetes   A1 - good control - O24.410    A2 - good control - O24.419      A2  - poor control or poor compliance - O24.419, E11.65   (Macrosomia or polyhydramnios) **E11.65 is extra code for poor control**    A2/B - O24.   Headache    Hepatitis 07/18/2022   Hepatitis B   History of kidney stones    Hypertension    Irritable bowel syndrome (IBS)    Joint pain    Morbid obesity with body mass index (BMI) of 45.0 to 49.9 in adult [facility])    Plantar fasciitis, bilateral    resolved   Polycystic ovary syndrome    Pre-diabetes    no meds, diet controlled, diet not check blood sugar   Prediabetes 09/16/2017   Early 1hr 70     Shortness of breath    albuterol inhaler   Sleep apnea    no CPAP use   Vertigo 2017  Past obstetric history: OB History  Gravida Para Term Preterm AB Living  4 1 1  0 2 1  SAB IAB Ectopic Multiple Live Births  2 0 0 0 1    # Outcome Date GA Lbr Maureen/2nd Weight Sex Type Anes PTL Lv  4 Current           3 SAB 2023             Birth Comments: had D&C  2 SAB 2021          1 Term 10/09/19 [redacted]w[redacted]d  3370 g F CS-Vac EPI  LIV     Birth Comments: GHTN, GDM, IOL for GHTN, C/S for decels    Past Surgical History: Past Surgical History:  Procedure Laterality Date   CESAREAN SECTION N/A 10/09/2019   Procedure: CESAREAN SECTION;  Surgeon: Strathcona Bing, MD;  Location: [facility]S;  Service: Obstetrics;  Laterality: N/A;   COLONOSCOPY WITH PROPOFOL  10/07/2016   DILATION AND CURETTAGE OF UTERUS     ESOPHAGOGASTRODUODENOSCOPY   12/21/2015   EXCISION MASS UPPER EXTREMETIES Left 04/21/2017   Procedure: EXCISION MASS LEFT MIDDLE FINGER;  Surgeon: Cindee Salt, MD;  Location: [facility];  Service: Orthopedics;  Laterality: Left;   gastric sleeve  05/09/2022   IR FL GUIDED LOC OF NEEDLE/CATH TIP FOR SPINAL INJECTION RT  03/13/2020   KNEE ARTHROSCOPY WITH LATERAL MENISECTOMY Left 10/31/2022   Procedure: KNEE ARTHROSCOPY WITH PARTIAL LATERAL MENISECTOMY;  Surgeon: Yolonda Kida, MD;  Location: [facility] ORS;  Service: Orthopedics;  Laterality: Left;  75   KNEE ARTHROSCOPY WITH LATERAL RELEASE Left 10/31/2022   Procedure: KNEE ARTHROSCOPY WITH LATERAL RELEASE AND LIMITED SYNOVECTOMY;  Surgeon: Yolonda Kida, MD;  Location: [facility] ORS;  Service: Orthopedics;  Laterality: Left;  75   PHOTOCOAGULATION WITH LASER Left 12/13/2020   Procedure: RETINOPLEXY LEFT EYE;  Surgeon: Stephannie Li, MD;  Location: [facility] OR;  Service: Ophthalmology;  Laterality: Left;   SCLERAL BUCKLE Right 12/13/2020   Procedure: SCLERAL BUCKLE WITH CRYO;  Surgeon: Stephannie Li, MD;  Location: [facility] OR;  Service: Ophthalmology;  Laterality: Right;    Family History: Family History  Problem Relation Age of Onset   Diabetes Maternal Aunt    Hypertension Maternal Uncle    Depression Maternal Grandfather    Diabetes Maternal Grandfather    Hypertension Maternal Grandfather    Arthritis Mother    High blood pressure Mother    Depression Mother    Sleep apnea Mother    Obesity Mother    Diabetes Mother    Hypertension Mother    Kidney failure Mother    Sleep apnea Father    Obesity Father    Healthy Daughter     Social History: Social History   Tobacco Use   Smoking status: Never   Smokeless tobacco: Never  Vaping Use   Vaping status: Never Used  Substance Use Topics   Alcohol use: Not Currently    Comment: social    Drug use: No    Allergies:  Allergies  Allergen Reactions   Bee Venom Hives, Shortness Of Breath and  Swelling   Hydrocodone-Acetaminophen Anaphylaxis    No problem when she takes Tylenol   Peach Flavoring Agent (Non-Screening) Hives, Shortness Of Breath and Other (See Comments)    SWELLING OF MOUTH   Pollen Extract Hives, Shortness Of Breath and Swelling   Remdesivir Swelling    Angioedema 8/25   Latex Rash   Cortisone Itching and Swelling  Cortizone-10 [Hydrocortisone]     Swelling and itching   Toradol [Ketorolac Tromethamine] Swelling    Meds:  Medications Prior to Admission  Medication Sig Dispense Refill Last Dose/Taking   aspirin EC 81 MG tablet Take 2 tablets (162 mg total) by mouth daily. Swallow whole. 180 tablet 12 11/30/2023   Magnesium Oxide -Mg Supplement (MAG-OXIDE) 200 MG TABS Take 2 tablets (400 mg total) by mouth at bedtime. If that amount causes loose stools in the am, switch to 200mg  daily at bedtime. 60 tablet 3 11/30/2023   Prenatal Vit-Fe Phos-FA-Omega (VITAFOL GUMMIES) 3.33-0.333-34.8 MG CHEW Chew 1 tablet by mouth daily. 90 tablet 3 11/30/2023   Vitamin D, Ergocalciferol, (DRISDOL) 1.25 MG (50000 UNIT) CAPS capsule Take 1 capsule (50,000 Units total) by mouth every 7 (seven) days. 10 capsule 0 11/30/2023   albuterol (VENTOLIN HFA) 108 (90 Base) MCG/ACT inhaler Inhale 1 puff into the lungs every 4 (four) hours as needed for shortness of breath. (Patient not taking: Reported on 11/25/2023)      Blood Pressure Monitoring (BLOOD PRESSURE KIT) DEVI 1 Device by Does not apply route as needed. 1 each 0     I have reviewed patient's Past Medical Hx, Surgical Hx, Family Hx, Social Hx, medications and allergies.   ROS:  Review of Systems  Constitutional:  Negative for chills and fever.  Eyes:  Negative for visual disturbance.  Respiratory:  Negative for shortness of breath.   Gastrointestinal:  Positive for abdominal pain. Negative for diarrhea and nausea.  Genitourinary:  Negative for dysuria.  Musculoskeletal:  Negative for back pain.  Neurological:  Positive for  headaches. Negative for weakness and numbness.   Other systems negative  Physical Exam  Patient Vitals for the past 24 hrs:  BP Temp Temp src Pulse Resp SpO2 Height Weight  12/01/23 0237 (!) 142/65 -- -- 92 -- -- -- --  12/01/23 0217 (!) 150/66 98.2 F (36.8 C) Oral 91 19 98 % 6' 2.5" (1.892 m) (!) 179.7 kg   Vitals:   12/01/23 0217 12/01/23 0237 12/01/23 0245 12/01/23 0300  BP: (!) 150/66 (!) 142/65 (!) 140/71 135/63   12/01/23 0320 12/01/23 0402 12/01/23 0415  BP: 101/71 (!) 106/56 129/85    Constitutional: Well-developed, well-nourished female in no acute distress.  Cardiovascular: normal rate  Respiratory: normal effort GI: Abd soft, non-tender, gravid appropriate for gestational age.   No rebound or guarding. MS: Extremities nontender, no edema, normal ROM Neurologic: Alert and oriented x 4.  GU: Neg CVAT.  PELVIC EXAM:  Dilation: Closed Effacement (%): 50 Station: Ballotable Presentation: Undeterminable Exam by:: [employer], RN  FHT:  Baseline 130 , moderate variability, accelerations present, no decelerations Contractions:  Irregular     Labs: Results for orders placed or performed during the hospital encounter of 12/01/23 (from the past 24 hours)  Urinalysis, Routine w reflex microscopic -Urine, Clean Catch     Status: None   Collection Time: 12/01/23  2:27 AM  Result Value Ref Range   Color, Urine YELLOW YELLOW   APPearance CLEAR CLEAR   Specific Gravity, Urine 1.012 1.005 - 1.030   pH 7.0 5.0 - 8.0   Glucose, UA NEGATIVE NEGATIVE mg/dL   Hgb urine dipstick NEGATIVE NEGATIVE   Bilirubin Urine NEGATIVE NEGATIVE   Ketones, ur NEGATIVE NEGATIVE mg/dL   Protein, ur NEGATIVE NEGATIVE mg/dL   Nitrite NEGATIVE NEGATIVE   Leukocytes,Ua NEGATIVE NEGATIVE  Protein / creatinine ratio, urine     Status: None   Collection Time:  12/01/23  2:27 AM  Result Value Ref Range   Creatinine, Urine 70 mg/dL   Total Protein, Urine 7 mg/dL   Protein Creatinine Ratio  0.10 0.00 - 0.15 mg/mg[Cre]  CBC     Status: Abnormal   Collection Time: 12/01/23  2:45 AM  Result Value Ref Range   WBC 6.0 4.0 - 10.5 K/uL   RBC 4.63 3.87 - 5.11 MIL/uL   Hemoglobin 11.3 (L) 12.0 - 15.0 g/dL   HCT 40.9 (L) 81.1 - 91.4 %   MCV 76.0 (L) 80.0 - 100.0 fL   MCH 24.4 (L) 26.0 - 34.0 pg   MCHC 32.1 30.0 - 36.0 g/dL   RDW 78.2 95.6 - 21.3 %   Platelets 201 150 - 400 K/uL   nRBC 0.0 0.0 - 0.2 %  Comprehensive metabolic panel     Status: Abnormal   Collection Time: 12/01/23  2:45 AM  Result Value Ref Range   Sodium 137 135 - 145 mmol/L   Potassium 3.9 3.5 - 5.1 mmol/L   Chloride 105 98 - 111 mmol/L   CO2 23 22 - 32 mmol/L   Glucose, Bld 93 70 - 99 mg/dL   BUN 5 (L) 6 - 20 mg/dL   Creatinine, Ser 0.86 0.44 - 1.00 mg/dL   Calcium 8.9 8.9 - 57.8 mg/dL   Total Protein 6.5 6.5 - 8.1 g/dL   Albumin 2.8 (L) 3.5 - 5.0 g/dL   AST 14 (L) 15 - 41 U/L   ALT 13 0 - 44 U/L   Alkaline Phosphatase 119 38 - 126 U/L   Total Bilirubin 0.6 0.0 - 1.2 mg/dL   GFR, Estimated >46 >96 mL/min   Anion gap 9 5 - 15  Sample to Blood Bank     Status: None   Collection Time: 12/01/23  2:55 AM  Result Value Ref Range   Blood Bank Specimen SAMPLE AVAILABLE FOR TESTING    Sample Expiration      12/04/2023,2359 Performed at [facility], [address] [place], [place] [address]    *Note: Due to a large number of results and/or encounters for the requested time period, some results have not been displayed. A complete set of results can be found in Results Review.    A/Positive/-- (07/18 1228)  Imaging:    MAU Course/MDM: I have reviewed the triage vital signs and the nursing notes.   Pertinent labs & imaging results that were available during my care of the patient were reviewed by me and considered in my medical decision making (see chart for details).      I have reviewed her medical records including past results, notes and treatments.   I have ordered labs and reviewed  results.  NST reviewed Consult Dr Debroah Loop with presentation, exam findings and test results.  Treatments in MAU included Reglan and Benadryl given IV with no relief.  Then we gave Excedrin Tension and Flexeril with diminished pain from "10' to "8".   MR Brain ordered to rule out pathology. .  >> MRI was negative 0551 Received Fentanyl Consulted Neurology who suggested adding Compazine. 4132: Patient received Magnesium 2g, 0650: Pt states headache has gone from "8" to "4-5" 0715:  Pt tells RN her h/a is "sneaking back" and is an "8" Will try the Compazine.   Assessment: Single IUP at [redacted]w[redacted]d Intermittent contractions Severe headache Chronic Hypertension  Plan: Care turned over to oncoming provider   Wynelle Bourgeois CNM, MSN Certified Nurse-Midwife 12/01/2023 2:46 AM  8:09 AM Assumed care from CNM. Received compazine at 0734. Reviewed medication trials:  Reglan Benadryl Excedrin Tension Fentanyl Magnesium Compazine  9:27 AM went to assess the patient. HA was previously down to 4-5 at about 8:15 but now has rebounded back to 8/10. She reports these HA feel very different from her prior migraines and are sharp/midline on forehead and associated with vision changes. Her typical migraines were NOT like this.   Reviewed in detail my recommendation to move to delivery due to [facility] with severe feature Patient amenable. See HP for consent.   Federico Flake, MD, MPH, ABFM, [facility] Attending Physician [facility] for [facility]

## 2023-12-01 NOTE — Lactation Note (Signed)
This note was copied from a baby's chart. Lactation Consultation Note  Patient Name: Maureen Lewis Today's Date: 12/01/2023 Age:26 hours Reason for consult: Initial assessment;Mother's request;Early term 37-38.6wks;Maternal endocrine disorder;Breastfeeding assistance  P2- MOB reports that she feels like infant has a tongue tie because he is not moving his tongue correctly. MOB requested for LC to perform an oral assessment. LC noted no tongue restrictions. LC did note that infant is a little uncoordinated and is tongue thrusting. LC explained what this means to MOB. MOB verbalized understanding and stated that she was just concerned because her first child had a tongue tie. Infant was rooting at this time, but had just finished drinking 28 mL of formula before LC came into room. LC offered to assist with latching and MOB consented.  MOB requested to use her nipple shield because she feels like she has flat nipples. LC encouraged MOB to attempt latching without the shield first because it is breastfeeding, not nipple feeding. LC did not note flat nipples. MOB in agreement about trying to latch without the shield. Infant was placed on the left breast in the football hold. At this time, infant had a difficult time latching onto the breast due to the tongue thrusting. We attempted a latch for about 10 minutes with no success. MOB's RN had previously set MOB up with a DEBP. LC encouraged MOB to use the DEBP after every feeding to ensure proper stimulation and to offer back EBM. MOB in agreement. LC reviewed feeding infant on cue 8-12x in 24 hrs, not allowing infant to go over 3 hrs without a feeding, CDC milk storage guidelines and LC services handout. LC encouraged MOB to call for further assistance as needed.  Maternal Data Has patient been taught Hand Expression?: No Does the patient have breastfeeding experience prior to this delivery?: Yes How long did the patient breastfeed?: 3  months  Feeding Mother's Current Feeding Choice: Breast Milk and Formula Nipple Type: Slow - flow  LATCH Score Latch: Repeated attempts needed to sustain latch, nipple held in mouth throughout feeding, stimulation needed to elicit sucking reflex.  Audible Swallowing: None  Type of Nipple: Everted at rest and after stimulation  Comfort (Breast/Nipple): Soft / non-tender  Hold (Positioning): Assistance needed to correctly position infant at breast and maintain latch.  LATCH Score: 6   Lactation Tools Discussed/Used Tools: Pump;Flanges;Nipple Shields Nipple shield size: 24 Flange Size: 24 Breast pump type: Double-Electric Breast Pump;Manual Pump Education: Setup, frequency, and cleaning;Milk Storage Reason for Pumping: MOB request/nipple shield use Pumping frequency: 15-20 min every 3 hrs  Interventions Interventions: Breast feeding basics reviewed;Assisted with latch;Breast compression;Adjust position;Support pillows;Position options;Hand pump;DEBP;Education;LC Services brochure  Discharge Discharge Education: Warning signs for feeding baby Pump: DEBP;Personal  Consult Status Consult Status: Follow-up Date: 12/02/23 Follow-up type: In-patient    Dema Severin BS, IBCLC 12/01/2023, 10:16 PM

## 2023-12-01 NOTE — Anesthesia Postprocedure Evaluation (Signed)
Anesthesia Post Note  Patient: Maureen Lewis  Procedure(s) Performed: CESAREAN SECTION WITH BILATERAL TUBAL LIGATION     Patient location during evaluation: PACU Anesthesia Type: Combined General/Spinal Level of consciousness: awake and alert Pain management: pain level controlled Vital Signs Assessment: post-procedure vital signs reviewed and stable Respiratory status: spontaneous breathing, nonlabored ventilation, respiratory function stable and patient connected to nasal cannula oxygen Cardiovascular status: blood pressure returned to baseline and stable Postop Assessment: no apparent nausea or vomiting Anesthetic complications: no   No notable events documented.  Last Vitals:   Last Pain:                 Collene Schlichter

## 2023-12-01 NOTE — Anesthesia Preprocedure Evaluation (Addendum)
Anesthesia Evaluation  Patient identified by MRN, date of birth, ID band Patient awake    Reviewed: Allergy & Precautions, NPO status , Patient's Chart, lab work & pertinent test results  Airway Mallampati: III  TM Distance: >3 FB Neck ROM: Full    Dental  (+) Dental Advisory Given, Missing, Chipped,    Pulmonary asthma , sleep apnea    Pulmonary exam normal breath sounds clear to auscultation       Cardiovascular hypertension, Normal cardiovascular exam Rhythm:Regular Rate:Normal     Neuro/Psych  Headaches PSYCHIATRIC DISORDERS  Depression       GI/Hepatic ,GERD  Medicated,,(+) Hepatitis -, B  Endo/Other    Class 4 obesity  Renal/GU negative Renal ROS     Musculoskeletal  (+)  Fibromyalgia -  Abdominal   Peds  Hematology  (+) Blood dyscrasia, anemia Plt 201k   Anesthesia Other Findings Day of surgery medications reviewed with the patient.  Reproductive/Obstetrics (+) Pregnancy C/s x2                             Anesthesia Physical Anesthesia Plan  ASA: 4 and emergent  Anesthesia Plan: Spinal   Post-op Pain Management:    Induction:   PONV Risk Score and Plan: 2 and Scopolamine patch - Pre-op, Dexamethasone and Ondansetron  Airway Management Planned:   Additional Equipment:   Intra-op Plan:   Post-operative Plan:   Informed Consent: I have reviewed the patients History and Physical, chart, labs and discussed the procedure including the risks, benefits and alternatives for the proposed anesthesia with the patient or authorized representative who has indicated his/her understanding and acceptance.     Dental advisory given  Plan Discussed with: CRNA, Anesthesiologist and Surgeon  Anesthesia Plan Comments: (Discussed risks and benefits of and differences between spinal and general. Discussed risks of spinal including headache, backache, failure, bleeding, infection, and  nerve damage. Patient consents to spinal. Questions answered. Coagulation studies and platelet count acceptable.)       Anesthesia Quick Evaluation

## 2023-12-01 NOTE — Anesthesia Procedure Notes (Signed)
Procedure Name: Intubation Date/Time: 12/01/2023 1:31 PM  Performed by: Elbert Ewings, CRNAPre-anesthesia Checklist: Patient identified, Patient being monitored, Timeout performed, Emergency Drugs available and Suction available Patient Re-evaluated:Patient Re-evaluated prior to induction Oxygen Delivery Method: Circle System Utilized Preoxygenation: Pre-oxygenation with 100% oxygen Induction Type: IV induction and Rapid sequence Laryngoscope Size: 3 and Glidescope Grade View: Grade I Tube type: Oral Tube size: 7.0 mm Number of attempts: 1 Airway Equipment and Method: stylet and Stylet Placement Confirmation: ETT inserted through vocal cords under direct vision, positive ETCO2 and breath sounds checked- equal and bilateral Secured at: 23 cm Tube secured with: Tape Dental Injury: Teeth and Oropharynx as per pre-operative assessment

## 2023-12-01 NOTE — Transfer of Care (Signed)
Immediate Anesthesia Transfer of Care Note  Patient: Maureen Lewis  Procedure(s) Performed: CESAREAN SECTION WITH BILATERAL TUBAL LIGATION  Patient Location: PACU  Anesthesia Type:Spinal  Level of Consciousness: awake, alert , and oriented  Airway & Oxygen Therapy: Patient Spontanous Breathing  Post-op Assessment: Report given to RN and Post -op Vital signs reviewed and stable  Post vital signs: Reviewed and stable  Last Vitals:  Vitals Value Taken Time  BP 134/64 12/01/23 1454  Temp    Pulse 66 12/01/23 1500  Resp 15 12/01/23 1500  SpO2 97 % 12/01/23 1500  Vitals shown include unfiled device data.  Last Pain:  Vitals:   12/01/23 1248  TempSrc:   PainSc: 0-No pain         Complications: No notable events documented.

## 2023-12-02 ENCOUNTER — Encounter: Payer: Medicaid Other | Admitting: Certified Nurse Midwife

## 2023-12-02 LAB — CBC
HCT: 33.7 % — ABNORMAL LOW (ref 36.0–46.0)
Hemoglobin: 10.9 g/dL — ABNORMAL LOW (ref 12.0–15.0)
MCH: 24.5 pg — ABNORMAL LOW (ref 26.0–34.0)
MCHC: 32.3 g/dL (ref 30.0–36.0)
MCV: 75.7 fL — ABNORMAL LOW (ref 80.0–100.0)
Platelets: 195 10*3/uL (ref 150–400)
RBC: 4.45 MIL/uL (ref 3.87–5.11)
RDW: 14.3 % (ref 11.5–15.5)
WBC: 7.7 10*3/uL (ref 4.0–10.5)
nRBC: 0 % (ref 0.0–0.2)

## 2023-12-02 LAB — RPR: RPR Ser Ql: NONREACTIVE

## 2023-12-02 MED ORDER — LACTATED RINGERS IV SOLN: INTRAVENOUS | Status: AC

## 2023-12-02 MED ORDER — OXYCODONE HCL 5 MG PO TABS
5.0000 mg | ORAL_TABLET | ORAL | Status: DC | PRN
Start: 2023-12-02 — End: 2023-12-02
  Administered 2023-12-02 (×2): 10 mg via ORAL
  Administered 2023-12-02: 5 mg via ORAL
  Filled 2023-12-02 (×2): qty 2
  Filled 2023-12-02: qty 1

## 2023-12-02 MED ORDER — MORPHINE BOLUS VIA INFUSION: 2.0000 mg | Freq: Once | INTRAVENOUS | Status: DC

## 2023-12-02 MED ORDER — GABAPENTIN 300 MG PO CAPS
300.0000 mg | ORAL_CAPSULE | Freq: Three times a day (TID) | ORAL | Status: DC
  Administered 2023-12-02 – 2023-12-04 (×6): 300 mg via ORAL
  Filled 2023-12-02 (×6): qty 1

## 2023-12-02 MED ORDER — HYDROMORPHONE HCL 2 MG PO TABS
2.0000 mg | ORAL_TABLET | Freq: Four times a day (QID) | ORAL | Status: DC | PRN
  Administered 2023-12-02 (×2): 2 mg via ORAL
  Administered 2023-12-03 – 2023-12-04 (×6): 4 mg via ORAL
  Filled 2023-12-02 (×2): qty 2
  Filled 2023-12-02: qty 1
  Filled 2023-12-02 (×4): qty 2
  Filled 2023-12-02: qty 1

## 2023-12-02 MED ORDER — FUROSEMIDE 20 MG PO TABS
20.0000 mg | ORAL_TABLET | Freq: Every day | ORAL | Status: DC
  Administered 2023-12-02 – 2023-12-04 (×3): 20 mg via ORAL
  Filled 2023-12-02 (×3): qty 1

## 2023-12-02 MED ORDER — MORPHINE SULFATE (PF) 2 MG/ML IV SOLN
2.0000 mg | Freq: Once | INTRAVENOUS | Status: AC
  Administered 2023-12-02: 2 mg via INTRAVENOUS
  Filled 2023-12-02: qty 1

## 2023-12-02 MED ORDER — ACETAMINOPHEN 10 MG/ML IV SOLN
1000.0000 mg | Freq: Four times a day (QID) | INTRAVENOUS | Status: AC
  Administered 2023-12-02 – 2023-12-03 (×4): 1000 mg via INTRAVENOUS
  Filled 2023-12-02 (×5): qty 100

## 2023-12-02 NOTE — Progress Notes (Signed)
POSTPARTUM PROGRESS NOTE  POD #1  Subjective:  Maureen Lewis is a 28 y.o. [id number] s/p repeat LTCS and BTL at [redacted]w[redacted]d.  No acute events overnight. She reports she is doing well. She denies any problems with ambulating, voiding or po intake. Denies nausea or vomiting. She has not passed flatus. Pain is well controlled.  Lochia is normal.  Pt is tolerating magnesium sulfate.  Objective: Blood pressure 123/60, pulse 77, temperature 98.5 F (36.9 C), temperature source Axillary, resp. rate 20, height 6' 2.5" (1.892 m), weight (!) 179.7 kg, last menstrual period 03/07/2023, SpO2 97%, unknown if currently breastfeeding.  Physical Exam:  General: alert, obese female, cooperative and no distress Chest: no respiratory distress Heart:regular rate and rhythm Abdomen: soft, nontender, nondistended, positive bowel sounds, obese Uterine Fundus: firm, appropriately tender DVT Evaluation: No calf swelling or tenderness Extremities: 1+ edema Skin: wound intact with preveena wound vac in place.  Recent Labs    12/01/23 0245 12/02/23 0451  HGB 11.3* 10.9*  HCT 35.2* 33.7*    Assessment/Plan: Maureen Lewis is a 28 y.o. [id number] s/p repeat c/s and BTL at [redacted]w[redacted]d for [facility] and previous c section.  POD#1 -  Contraception: tubal ligation Feeding: breast/bottle Pt completes magnesium sulfate recovery this afternoon. BP currently normal, but will monitor, start po lasix today, 20 mg, CMP checked and potassium was normal Dispo: continue routine postpartum care   LOS: 1 day   Mariel Aloe, Md Faculty Attending, [facility] [employer] 12/02/2023, 12:37 PM

## 2023-12-02 NOTE — Lactation Note (Signed)
This note was copied from a baby's chart. Lactation Consultation Note  Patient Name: Boy Shantae Porcaro Today's Date: 12/02/2023 Age:26 hours Reason for consult: Follow-up assessment;Mother's request;Early term 37-38.6wks;Nipple pain/trauma  P2- MOB reports extreme pain when pumping. Per MOB, she has hyper sensitive breasts. MOB has been pumping with size 24 mm flanges, but requested to use size 27 mm flanges. LC advised to use her true size 24 mm, but she again requested the 27 flanges. LC provided these to MOB with coconut oil. MOB denied having further questions or concerns. LC encouraged MOB to call for further assistance as needed.  Maternal Data Has patient been taught Hand Expression?: Yes Does the patient have breastfeeding experience prior to this delivery?: Yes  Feeding Mother's Current Feeding Choice: Breast Milk and Formula  Lactation Tools Discussed/Used Tools: Pump;Flanges Flange Size: 27 Breast pump type: Double-Electric Breast Pump;Manual Pump Education: Setup, frequency, and cleaning;Milk Storage  Interventions Interventions: Breast feeding basics reviewed;Education;LC Services brochure  Consult Status Consult Status: Follow-up Date: 12/03/23 Follow-up type: In-patient    Dema Severin BS, IBCLC 12/02/2023, 7:52 PM

## 2023-12-03 ENCOUNTER — Encounter (HOSPITAL_COMMUNITY): Payer: Self-pay | Admitting: Obstetrics and Gynecology

## 2023-12-03 LAB — SURGICAL PATHOLOGY

## 2023-12-03 NOTE — Progress Notes (Signed)
POSTPARTUM PROGRESS NOTE  POD #2 RLTCS/BTL  Subjective:  Maureen Lewis is a 28 y.o. [id number] s/p repeat LTCS and BTL at [redacted]w[redacted]d.  No acute events overnight. She reports she is doing well but still has some issues with pain control. She denies any problems with ambulating, voiding or po intake. Denies nausea or vomiting. She has not passed flatus. Pain is well controlled. Lochia is normal.   Objective: Blood pressure (!) 121/57, pulse 80, temperature 98.4 F (36.9 C), temperature source Oral, resp. rate 17, height 6' 2.5" (1.892 m), weight (!) 179.7 kg, last menstrual period 03/07/2023, SpO2 96%, unknown if currently breastfeeding.  Physical Exam:  General: alert, obese female, cooperative and no distress Chest: no respiratory distress Heart:regular rate and rhythm Abdomen: soft, nontender, nondistended, positive bowel sounds, obese Uterine Fundus: firm, appropriately tender DVT Evaluation: No calf swelling or tenderness Extremities: 1+ edema Skin: wound intact with preveena wound vac in place.  Recent Labs    12/01/23 0245 12/02/23 0451  HGB 11.3* 10.9*  HCT 35.2* 33.7*    Assessment/Plan: Maureen Lewis is a 28 y.o. [id number] s/p repeat c/s and BTL at [redacted]w[redacted]d for [facility] and previous c section.  Contraception: tubal ligation Feeding: breast/bottle She is s/p Magnesium CHTN: BP currently normal, but will monitor, Continue po lasix today, 20 mg, CMP checked and potassium was normal. Will recommend PP follow up with Dr. Servando Salina.  Pain is moderately well controlled on dilaudid and tylenol IV. Cannot do ibuprofen due to gastric sleeve and has anaphylaxis history with toradol.  Rh pos, Rubella immune Baby boy s/p circ.  Dispo: continue routine postpartum care. Anticipate D/C home tomorrow.    Milas Hock Faculty Attending, [facility] [employer] 12/03/2023, 4:47 PM

## 2023-12-04 ENCOUNTER — Other Ambulatory Visit (HOSPITAL_COMMUNITY): Payer: Self-pay

## 2023-12-04 LAB — COMPREHENSIVE METABOLIC PANEL
ALT: 14 U/L (ref 0–44)
AST: 17 U/L (ref 15–41)
Albumin: 2.7 g/dL — ABNORMAL LOW (ref 3.5–5.0)
Alkaline Phosphatase: 84 U/L (ref 38–126)
Anion gap: 11 (ref 5–15)
BUN: 8 mg/dL (ref 6–20)
CO2: 20 mmol/L — ABNORMAL LOW (ref 22–32)
Calcium: 8.8 mg/dL — ABNORMAL LOW (ref 8.9–10.3)
Chloride: 107 mmol/L (ref 98–111)
Creatinine, Ser: 0.48 mg/dL (ref 0.44–1.00)
GFR, Estimated: >60 mL/min (ref >60)
Glucose, Bld: 86 mg/dL (ref 70–99)
Potassium: 4.6 mmol/L (ref 3.5–5.1)
Sodium: 138 mmol/L (ref 135–145)
Total Bilirubin: 0.5 mg/dL (ref 0.0–1.2)
Total Protein: 6.3 g/dL — ABNORMAL LOW (ref 6.5–8.1)

## 2023-12-04 MED ORDER — GABAPENTIN 300 MG PO CAPS
300.0000 mg | ORAL_CAPSULE | Freq: Three times a day (TID) | ORAL | 0 refills | Status: DC
  Filled 2023-12-04: qty 30, 10d supply, fill #0

## 2023-12-04 MED ORDER — ACETAMINOPHEN 325 MG PO TABS
650.0000 mg | ORAL_TABLET | Freq: Four times a day (QID) | ORAL | 0 refills | Status: DC | PRN
  Filled 2023-12-04: qty 60, 8d supply, fill #0

## 2023-12-04 MED ORDER — HYDROMORPHONE HCL 2 MG PO TABS
2.0000 mg | ORAL_TABLET | Freq: Four times a day (QID) | ORAL | 0 refills | Status: DC | PRN
  Filled 2023-12-04: qty 30, 4d supply, fill #0

## 2023-12-04 MED ORDER — FUROSEMIDE 20 MG PO TABS
20.0000 mg | ORAL_TABLET | Freq: Every day | ORAL | 0 refills | Status: DC
  Filled 2023-12-04: qty 5, 5d supply, fill #0

## 2023-12-04 NOTE — Plan of Care (Signed)
  Problem: Education: Goal: Knowledge of disease or condition will improve Outcome: Adequate for Discharge Goal: Knowledge of the prescribed therapeutic regimen will improve Outcome: Adequate for Discharge   Problem: Fluid Volume: Goal: Peripheral tissue perfusion will improve Outcome: Adequate for Discharge   Problem: Clinical Measurements: Goal: Complications related to disease process, condition or treatment will be avoided or minimized Outcome: Adequate for Discharge   Problem: Clinical Measurements: Goal: Ability to maintain clinical measurements within normal limits will improve Outcome: Adequate for Discharge Goal: Diagnostic test results will improve Outcome: Adequate for Discharge Goal: Respiratory complications will improve Outcome: Adequate for Discharge Goal: Cardiovascular complication will be avoided Outcome: Adequate for Discharge   Problem: Activity: Goal: Risk for activity intolerance will decrease Outcome: Adequate for Discharge   Problem: Coping: Goal: Level of anxiety will decrease Outcome: Adequate for Discharge   Problem: Elimination: Goal: Will not experience complications related to bowel motility Outcome: Adequate for Discharge Goal: Will not experience complications related to urinary retention Outcome: Adequate for Discharge   Problem: Safety: Goal: Ability to remain free from injury will improve Outcome: Adequate for Discharge   Problem: Skin Integrity: Goal: Risk for impaired skin integrity will decrease Outcome: Adequate for Discharge   Problem: Education: Goal: Knowledge of the prescribed therapeutic regimen will improve Outcome: Adequate for Discharge Goal: Understanding of sexual limitations or changes related to disease process or condition will improve Outcome: Adequate for Discharge Goal: Individualized Educational Video(s) Outcome: Adequate for Discharge   Problem: Self-Concept: Goal: Communication of feelings regarding  changes in body function or appearance will improve Outcome: Adequate for Discharge   Problem: Skin Integrity: Goal: Demonstration of wound healing without infection will improve Outcome: Adequate for Discharge

## 2023-12-04 NOTE — Progress Notes (Signed)
   12/04/23 1545  Departure Condition  Departure Condition Good  Mobility at [facility]  Patient/Caregiver Teaching Teach Back Method Used;Discharge instructions reviewed;Prescriptions reviewed;Follow-up care reviewed;Patient/caregiver verbalized understanding;Pain management discussed;Educated about hypertension in pregnancy;Medications discussed  Departure Mode With family  Was procedural sedation performed on this patient during this visit? No   Patient alert and oriented x4, VS and pain stable.

## 2023-12-04 NOTE — Lactation Note (Signed)
This note was copied from a baby's chart. Lactation Consultation Note  Patient Name: Maureen Lewis Today's Date: 12/04/2023 Age:26 hours Reason for consult: Follow-up assessment;Early term 37-38.6wks;Maternal endocrine disorder;Breastfeeding assistance  PCOS  P2, Baby [redacted]w[redacted]d.  Voids/stools WNL.  Weight increased.  Mother states she continues to work on latching which has been challenging due to baby tongue thrusting which seems to be resolving.  Baby was able to grasp LC finger with ease.  Noted rhythmical pattern.  Mother hand expressed drops prior to latching.  Assisted with latching in side lying football hold.  After a few attempts baby was able to sustain latch for 10 min.  Due to GA, baby fatigued after 10 min.  Mother has been hand expressing and occasionally pumping 18-40 ml per session. She states she prefers to hand express but has been able to tolerate pumping with 27 flanges.  Baby consumed 18 ml of mother's milk with slow flow nipple after breastfeeding session.  Baby has been consuming up to 40 ml.  She has been supplementing with Enfamil also.    Reminded mother to pump q 3 hours.  Reviewed milk storage.  Praised mother for her efforts.  Mother states she has Lactation OP follow up.  Reviewed engorgement care and monitoring voids/stools.  Maternal Data Has patient been taught Hand Expression?: Yes Does the patient have breastfeeding experience prior to this delivery?: Yes  Feeding Mother's Current Feeding Choice: Breast Milk and Formula Nipple Type: Slow - flow  LATCH Score Latch: Repeated attempts needed to sustain latch, nipple held in mouth throughout feeding, stimulation needed to elicit sucking reflex.  Audible Swallowing: A few with stimulation  Type of Nipple: Everted at rest and after stimulation  Comfort (Breast/Nipple): Soft / non-tender  Hold (Positioning): Assistance needed to correctly position infant at breast and maintain latch.  LATCH Score:  7   Lactation Tools Discussed/Used Tools: Pump;Flanges Flange Size: 27 Breast pump type: Double-Electric Breast Pump;Manual Pump Education: Milk Storage Pumping frequency: q 3 hours for 15 min Pumped volume: 18 mL  Interventions Interventions: Breast feeding basics reviewed;Assisted with latch;Skin to skin;Hand express;Support pillows;Adjust position;DEBP;Education  Discharge Discharge Education: Engorgement and breast care;Warning signs for feeding baby;Outpatient recommendation Pump: DEBP;Personal (Spectra and Medela)  Consult Status Consult Status: Complete Date: 12/04/23   Hardie Pulley  RN, IBCLC 12/04/2023, 12:41 PM

## 2023-12-04 NOTE — Progress Notes (Addendum)
CSW acknowledged consult that MOB requested to speak with CSW. CSW met with MOB at bedside and introduced self. MOB shared that she has black mold in her home and is interested in housing resources. CSW provided MOB with local housing resources. CSW inquired about MOB's interest in shelter resources, MOB declined. CSW inquired about MOB's ability to reside with any family/friends, MOB denied having anyone that she can stay noting that most of her family lives with her. CSW encouraged MOB to follow up with housing resources provided, MOB agreed. CSW inquired about any additional needs for resources/supports, MOB denied any additional needs. CSW explained that Resource for New Parents and [facility] that were provided encompass multiple resources if needed. MOB thanked CSW for visit.   CSW returned to MOB's room and provided information about [facility] benefit for environmental modification (mold remediation) and provided contact information to follow up. MOB thanked CSW for information and agreed to call.   Celso Sickle, LCSW Clinical Social Worker [facility] Cell#: [phone]

## 2023-12-07 ENCOUNTER — Encounter (HOSPITAL_COMMUNITY): Payer: Medicaid Other

## 2023-12-07 DIAGNOSIS — F439 Reaction to severe stress, unspecified: Secondary | ICD-10-CM | POA: Diagnosis not present

## 2023-12-08 ENCOUNTER — Inpatient Hospital Stay (HOSPITAL_COMMUNITY)
Admission: AD | Admit: 2023-12-08 | Payer: Medicaid Other | Source: Home / Self Care | Admitting: Obstetrics and Gynecology

## 2023-12-09 ENCOUNTER — Ambulatory Visit: Payer: Self-pay

## 2023-12-09 ENCOUNTER — Telehealth (HOSPITAL_COMMUNITY): Payer: Self-pay | Admitting: *Deleted

## 2023-12-09 NOTE — Telephone Encounter (Addendum)
12/09/2023  Name: Maureen Lewis MRN: [id number] DOB: [birth year]  Reason for Call:  Transition of Care Hospital Discharge Call  Contact Status: Patient Contact Status: Complete  Language assistant needed: Interpreter Mode: Interpreter Not Needed        Follow-Up Questions: Do You Have Any Concerns About Your Health As You Heal From Delivery?: Yes What Concerns Do You Have About Your Health?: Is unable to get into OB office d/t weather.  Has a Provena wound vac on over cesarean incision and not much battery power left.  Wonders what to do when the battery dies.  Also, she has been taking her BPs and they are running 150s/80s.  Denies HA, visual disturbance, or RUQ pain.  She said she was sent home with Lasix x5days, but not other antihypertensive.  She is unable to enter BP values into [employer].  Encouraged patient to call office and ask what to do about Provena and to report recent BP findings. Do You Have Any Concerns About Your Infants Health?: No  Edinburgh Postnatal Depression Scale:  In the Past 7 Days: I have been able to laugh and see the funny side of things.: As much as I always could I have looked forward with enjoyment to things.: As much as I ever did I have blamed myself unnecessarily when things went wrong.: No, never I have been anxious or worried for no good reason.: No, not at all I have felt scared or panicky for no good reason.: No, not at all Things have been getting on top of me.: No, I have been coping as well as ever I have been so unhappy that I have had difficulty sleeping.: Not at all I have felt sad or miserable.: No, not at all I have been so unhappy that I have been crying.: No, never The thought of harming myself has occurred to me.: Never Inocente Salles Postnatal Depression Scale Total: 0  PHQ2-9 Depression Scale:     Discharge Follow-up: Edinburgh score requires follow up?: No Patient was advised of the following resources:: Breastfeeding  Support Group, Support Group  Post-discharge interventions: Reviewed Newborn Safe Sleep Practices Left a voice mail on the nurse message line at [facility] [phone], option 3, making them aware that patient may not be able to attend appt tomorrow d/t weather, but that she has questions about her Provena wound vac and that her BPs have been running 150s/80s.  Salena Saner, RN 12/09/2023 15:17

## 2023-12-10 ENCOUNTER — Other Ambulatory Visit: Payer: Self-pay

## 2023-12-10 ENCOUNTER — Ambulatory Visit (INDEPENDENT_AMBULATORY_CARE_PROVIDER_SITE_OTHER): Payer: Self-pay

## 2023-12-10 VITALS — BP 126/64 | HR 63 | Wt 376.1 lb

## 2023-12-10 DIAGNOSIS — Z0130 Encounter for examination of blood pressure without abnormal findings: Secondary | ICD-10-CM

## 2023-12-10 DIAGNOSIS — Z5189 Encounter for other specified aftercare: Secondary | ICD-10-CM

## 2023-12-10 NOTE — Progress Notes (Signed)
Blood Pressure & Incision Check Visit  Dymin Nandia Renee Ellen here for BP check following repeat c-section on 12/01/23. Chronic hypertension with pre-e. BP normal prior to discharge. Taking Lasix 20 mg daily for 5 days, last dose today. BP elevated on home cuff 150s/90s. BP today 126/64. Headache every other day, relieved by rest. Has stopped caffeine following c-section; patient to trial resuming to see if this improves headaches.  Provena wound vac removed. Incision is clean and dry, appears pink and well healing. Minimal red, irritated skin below incision. Reviewed good wound care and emphasized need to keep area dry to prevent skin breakdown.  Patient would like to follow up in 1 week for recheck of BP and incision. Reviewed with Crissie Reese MD who is agreeable with plan. Postpartum visit scheduled with Edd Arbour, CNM for 12/30/23.  Marjo Bicker, RN 12/10/2023  10:11 AM

## 2023-12-14 ENCOUNTER — Ambulatory Visit: Payer: Medicaid Other | Admitting: Obstetrics and Gynecology

## 2023-12-14 ENCOUNTER — Other Ambulatory Visit: Payer: Self-pay

## 2023-12-14 DIAGNOSIS — O10919 Unspecified pre-existing hypertension complicating pregnancy, unspecified trimester: Secondary | ICD-10-CM

## 2023-12-14 DIAGNOSIS — Z9079 Acquired absence of other genital organ(s): Secondary | ICD-10-CM

## 2023-12-14 MED ORDER — AMLODIPINE BESYLATE 5 MG PO TABS
5.0000 mg | ORAL_TABLET | Freq: Every day | ORAL | Status: DC
Start: 2023-12-14 — End: 2023-12-16

## 2023-12-14 MED ORDER — GABAPENTIN 300 MG PO CAPS: 600.0000 mg | ORAL_CAPSULE | Freq: Three times a day (TID) | ORAL | 0 refills | Status: DC

## 2023-12-14 NOTE — Progress Notes (Signed)
 Post Partum Visit Note  Maureen Lewis is a 28 y.o. [phone] female who presents for a postpartum visit. She is  13  days postpartum following a repeat cesarean section and tubal ligation was performed.  I have fully reviewed the prenatal and intrapartum course. The delivery was at 37 gestational weeks.  Anesthesia: spinal and general. Postpartum course has been ok. Baby is doing well. Baby is feeding by breast. Bleeding thin lochia. Bowel function is normal. Bladder function is normal. Patient is not sexually active. Contraception method is tubal ligation. Postpartum depression screening: negative.   The pregnancy intention screening data noted above was reviewed. Potential methods of contraception were discussed. The patient elected to proceed with No data recorded.    Health Maintenance Due  Topic Date Due   Pneumococcal Vaccine 67-68 Years old (1 of 2 - PCV) Never done   COVID-19 Vaccine (3 - Pfizer risk series) 09/14/2020    The following portions of the patient's history were reviewed and updated as appropriate: allergies, current medications, past family history, past medical history, past social history, past surgical history, and problem list.  Review of Systems Pertinent items are noted in HPI.  Objective:  BP (!) 135/90   Pulse 80   Wt (!) 372 lb 6 oz (168.9 kg)   LMP 03/07/2023 Comment: irreg bleeding, neg blood test 5/19  BMI 47.17 kg/m    General:  alert, cooperative, and appears stated age   Breasts:  normal  Lungs: Symmetric, effortless        Wound well approximated incision          Assessment:  Postpartum care and examination  History of bilateral salpingectomy  Chronic hypertension affecting pregnancy   Normal postpartum exam.  Incision clean, well approximated. In skin fold, under pannus. - encourage keeping wound clean and dry; pads provided - burning pain; increased gabapentin 300mg  TID to 600mg  TID  Plan:   Essential components  of care per ACOG recommendations:  1.  Mood and well being: Patient with negative depression screening today. Reviewed local resources for support.  - Patient tobacco use? No.   - hx of drug use? No.    2. Infant care and feeding:  -Patient currently breastmilk feeding? Yes. Discussed returning to work and pumping.  -Social determinants of health (SDOH) reviewed in EPIC. No concerns   3. Sexuality, contraception and birth spacing - Patient does not want a pregnancy in the next year.  Desired family size is 2 children.  - Reviewed reproductive life planning. Reviewed contraceptive methods based on pt preferences and effectiveness.  Patient desired Female Sterilization today.   - Discussed birth spacing of 18 months  4. Sleep and fatigue -Encouraged family/partner/community support of 4 hrs of uninterrupted sleep to help with mood and fatigue  5. Physical Recovery  - Discussed patients delivery and complications.  - Patient had a C-section. Incision well approximated. Patient expressed understanding - Patient has urinary incontinence? No. - Patient is not safe to resume physical and sexual activity  6.  Health Maintenance - HM due items addressed No - incision check add on - Last pap smear  Diagnosis  Date Value Ref Range Status  05/07/2023 (A)  Corrected   - Atypical squamous cells of undetermined significance (ASC-US)   Pap smear not done at today's visit.  - Breast Cancer screening indicated? No.   7. Chronic Disease/Pregnancy Condition follow up: Hypertension - restart amlodipine 5mg  daily - follow up 1 week for  BP check and likely medication titration  Wyn Forster, MD [facility] [employer], [facility]

## 2023-12-15 ENCOUNTER — Other Ambulatory Visit: Payer: Self-pay | Admitting: Certified Nurse Midwife

## 2023-12-15 ENCOUNTER — Ambulatory Visit: Payer: Self-pay

## 2023-12-15 DIAGNOSIS — Z8639 Personal history of other endocrine, nutritional and metabolic disease: Secondary | ICD-10-CM

## 2023-12-16 ENCOUNTER — Other Ambulatory Visit: Payer: Self-pay

## 2023-12-16 ENCOUNTER — Ambulatory Visit (INDEPENDENT_AMBULATORY_CARE_PROVIDER_SITE_OTHER): Payer: Medicaid Other | Admitting: Certified Nurse Midwife

## 2023-12-16 ENCOUNTER — Encounter: Payer: Self-pay | Admitting: Certified Nurse Midwife

## 2023-12-16 VITALS — BP 114/73 | HR 66 | Wt 373.3 lb

## 2023-12-16 DIAGNOSIS — B372 Candidiasis of skin and nail: Secondary | ICD-10-CM | POA: Diagnosis not present

## 2023-12-16 DIAGNOSIS — I10 Essential (primary) hypertension: Secondary | ICD-10-CM | POA: Diagnosis not present

## 2023-12-16 MED ORDER — EXCEDRIN TENSION HEADACHE 500-65 MG PO TABS: 2.0000 | ORAL_TABLET | Freq: Four times a day (QID) | ORAL | 0 refills | Status: DC | PRN

## 2023-12-16 MED ORDER — AMLODIPINE BESYLATE 5 MG PO TABS: ORAL_TABLET | ORAL | 1 refills | Status: DC

## 2023-12-16 MED ORDER — NYSTATIN 100000 UNIT/GM EX POWD: 1.0000 | Freq: Three times a day (TID) | CUTANEOUS | 0 refills | Status: DC

## 2023-12-16 NOTE — Progress Notes (Signed)
 History:  [name] is a 28 y.o. [phone] who presents to clinic today for elevated blood pressure with headache and mild visual disturbances overnight. BP high at home but not severe range. Was seen in the office recently but meds were not sent in so she was unable to start her norvasc. Headaches are responsive to tylenol if she has caffeine with it. Pain is not well-controlled, able to pick up her gabapentin but notes a burning sensation at there incision site as she tries to get around.  The following portions of the patient's history were reviewed and updated as appropriate: allergies, current medications, family history, past medical history, social history, past surgical history and problem list.  Review of Systems:  Review of Systems  Constitutional:  Negative for chills and fever.  Gastrointestinal:  Negative for constipation, diarrhea, nausea and vomiting.  Neurological:  Positive for headaches. Negative for dizziness.   Objective:  Physical Exam LMP 03/07/2023 Comment: irreg bleeding, neg blood test 5/19 Physical Exam Vitals reviewed. Exam conducted with a chaperone present.  Constitutional:      General: She is not in acute distress.    Appearance: Normal appearance. She is obese. She is not ill-appearing.  Cardiovascular:     Rate and Rhythm: Normal rate and regular rhythm.  Pulmonary:     Effort: Pulmonary effort is normal.  Abdominal:     Tenderness: There is no abdominal tenderness.  Musculoskeletal:        General: Normal range of motion.  Skin:    General: Skin is warm and dry.     Capillary Refill: Capillary refill takes less than 2 seconds.       Neurological:     Mental Status: She is alert and oriented to person, place, and time.  Psychiatric:        Mood and Affect: Mood normal.        Behavior: Behavior normal.   Labs and Imaging No results found. However, due to the size of the patient record, not all encounters were searched. Please  check Results Review for a complete set of results.  No results found.   Assessment & Plan:  1. Chronic hypertension (Primary) - amlodipine put in as a clinic administered med and never sent to pharmacy. Resent correctly. Initially advised norvasc twice daily but recheck of BP was very normal. Pt educated to just take it daily. - amLODipine (NORVASC) 5 MG tablet; Take twice daily (breakfast and bedtime) for two days then daily until your postpartum appointment.  Dispense: 30 tablet; Refill: 1 - Comp Met (CMET) - CBC - acetaminophen-caffeine (EXCEDRIN TENSION HEADACHE) 500-65 MG TABS per tablet; Take 2 tablets by mouth 4 (four) times daily as needed.  Dispense: 60 tablet; Refill: 0  2. Skin yeast infection - nystatin (MYCOSTATIN/NYSTOP) powder; Apply 1 Application topically 3 (three) times daily.  Dispense: 15 g; Refill: 0   Follow up as scheduled for postpartum visit.  Bernerd Limbo, [place] 12/16/2023 8:26 AM

## 2023-12-16 NOTE — Telephone Encounter (Signed)
 Addressed at visit 12/16/23 with Dan Humphreys, CNM.

## 2023-12-16 NOTE — Addendum Note (Signed)
 Addended by: Edd Arbour on: 12/16/2023 08:18 AM   Modules accepted: Orders

## 2023-12-17 ENCOUNTER — Ambulatory Visit: Payer: Medicaid Other

## 2023-12-17 LAB — COMPREHENSIVE METABOLIC PANEL
ALT: 6 [IU]/L (ref 0–32)
AST: 9 [IU]/L (ref 0–40)
Albumin: 3.9 g/dL — ABNORMAL LOW (ref 4.0–5.0)
Alkaline Phosphatase: 109 [IU]/L (ref 44–121)
BUN/Creatinine Ratio: 18 (ref 9–23)
BUN: 11 mg/dL (ref 6–20)
Bilirubin Total: 0.6 mg/dL (ref 0.0–1.2)
CO2: 20 mmol/L (ref 20–29)
Calcium: 8.9 mg/dL (ref 8.7–10.2)
Chloride: 107 mmol/L — ABNORMAL HIGH (ref 96–106)
Creatinine, Ser: 0.6 mg/dL (ref 0.57–1.00)
Globulin, Total: 2.7 g/dL (ref 1.5–4.5)
Glucose: 83 mg/dL (ref 70–99)
Potassium: 4.5 mmol/L (ref 3.5–5.2)
Sodium: 142 mmol/L (ref 134–144)
Total Protein: 6.6 g/dL (ref 6.0–8.5)
eGFR: 128 mL/min/{1.73_m2} (ref >59)

## 2023-12-17 LAB — CBC
Hematocrit: 39.2 % (ref 34.0–46.6)
Hemoglobin: 12.4 g/dL (ref 11.1–15.9)
MCH: 24.3 pg — ABNORMAL LOW (ref 26.6–33.0)
MCHC: 31.6 g/dL (ref 31.5–35.7)
MCV: 77 fL — ABNORMAL LOW (ref 79–97)
Platelets: 243 10*3/uL (ref 150–450)
RBC: 5.11 x10E6/uL (ref 3.77–5.28)
RDW: 14.1 % (ref 11.7–15.4)
WBC: 4.8 10*3/uL (ref 3.4–10.8)

## 2023-12-18 ENCOUNTER — Encounter: Payer: Self-pay | Admitting: Certified Nurse Midwife

## 2023-12-23 DIAGNOSIS — F99 Mental disorder, not otherwise specified: Secondary | ICD-10-CM | POA: Diagnosis not present

## 2023-12-30 ENCOUNTER — Encounter: Payer: Self-pay | Admitting: Certified Nurse Midwife

## 2023-12-30 ENCOUNTER — Ambulatory Visit: Payer: Self-pay | Admitting: Certified Nurse Midwife

## 2023-12-30 ENCOUNTER — Other Ambulatory Visit: Payer: Self-pay

## 2023-12-30 DIAGNOSIS — Z98891 History of uterine scar from previous surgery: Secondary | ICD-10-CM

## 2023-12-30 DIAGNOSIS — B3731 Acute candidiasis of vulva and vagina: Secondary | ICD-10-CM | POA: Diagnosis not present

## 2023-12-30 DIAGNOSIS — R87610 Atypical squamous cells of undetermined significance on cytologic smear of cervix (ASC-US): Secondary | ICD-10-CM | POA: Diagnosis not present

## 2023-12-30 DIAGNOSIS — I10 Essential (primary) hypertension: Secondary | ICD-10-CM

## 2023-12-30 NOTE — Progress Notes (Unsigned)
 Post Partum Visit Note  Maureen Lewis is a 28 y.o. [phone] female who presents for a postpartum visit. She is 4 weeks 1 day postpartum following a repeat cesarean section.  I have fully reviewed the prenatal and intrapartum course. The delivery was at [redacted]w[redacted]d.  Anesthesia: spinal. Postpartum course has been complicated by postpartum hypertension, a yeast infection at the incision site, difficulty managing post-op pain initially and difficulties breastfeeding. Baby is doing well. Baby is feeding by both breast and bottle - Similac Sensitive RS. Bleeding no bleeding. Bowel function is normal. Bladder function is normal. Patient is not sexually active. Contraception method is tubal ligation. Postpartum depression screening: negative.  The pregnancy intention screening data noted above was reviewed. Potential methods of contraception were discussed. The patient elected to proceed with Female Sterilization.   Edinburgh Postnatal Depression Scale - 12/30/23 1343       Edinburgh Postnatal Depression Scale:  In the Past 7 Days   I have been able to laugh and see the funny side of things. 0    I have looked forward with enjoyment to things. 0    I have blamed myself unnecessarily when things went wrong. 0    I have been anxious or worried for no good reason. 0    I have felt scared or panicky for no good reason. 0    Things have been getting on top of me. 2    I have been so unhappy that I have had difficulty sleeping. 0    I have felt sad or miserable. 2    I have been so unhappy that I have been crying. 2    The thought of harming myself has occurred to me. 0    Edinburgh Postnatal Depression Scale Total 6            Health Maintenance Due  Topic Date Due   Pneumococcal Vaccine 52-35 Years old (1 of 2 - PCV) Never done   COVID-19 Vaccine (3 - Pfizer risk series) 09/14/2020   The following portions of the patient's history were reviewed and updated as appropriate: allergies,  current medications, past family history, past medical history, past social history, past surgical history, and problem list.  Review of Systems Pertinent items noted in HPI and remainder of comprehensive ROS otherwise negative.  Objective:  BP 121/81   Pulse 73   Wt (!) 377 lb 11.2 oz (171.3 kg)   LMP 03/07/2023   Breastfeeding Yes   BMI 47.85 kg/m    Constitutional: Alert, oriented female in no physical distress.  HEENT: PERRLA Skin: normal color and turgor, no rash Cardiovascular: normal rate & rhythm Respiratory: normal effort, no problems with respiration noted GI: Abd soft, non-tender (except close to incision) MS: Extremities nontender, no edema, normal ROM Neurologic: Alert and oriented x 4.  GU: no CVA tenderness Pelvic: exam deferred Breasts: normal lactating breasts, no edema or signs of nipple damage Incision: redness with clustered rash that is worse than previous exam. One area where incision is not well-approximated, about 1cm near the midline where the crease of her panus is preventing approximation. Dr. Nobie Putnam inspected the incision and applied steri-strips, instructed pt to keep an abd pad over the site so the edges can approximate. Will switch to nystatin cream (per pt request), and have her follow up on Friday.  Assessment:  1. Postpartum care and examination - Recovering well overall  2. History of 2 cesarean sections  3. Atypical squamous cells  of undetermined significance on cytologic smear of cervix (ASC-US) - Will repeat pap in July 2025.  4. Chronic hypertension - Still on norvasc twice daily, will have her back in 4wks for BP check.   Plan:   Essential components of care per ACOG recommendations:  1.  Mood and well being: Patient with positive depression screening today. Reviewed local resources for support.  - Patient tobacco use? No.   - hx of drug use? No.    2. Infant care and feeding:  -Patient currently breastmilk feeding? Yes, has  follow up and is aware of community resources. Reassurance given. -Social determinants of health (SDOH) reviewed in EPIC.   3. Sexuality, contraception and birth spacing - Patient does not want a pregnancy in the next year.  Desired family size is 2 children.  - Reviewed reproductive life planning. Reviewed contraceptive methods based on pt preferences and effectiveness.  Patient desired Female Sterilization today.   - Discussed birth spacing of 18 months  4. Sleep and fatigue -Encouraged family/partner/community support of 4 hrs of uninterrupted sleep to help with mood and fatigue  5. Physical Recovery  - Discussed patients delivery and complications. She describes her labor as mixed. - Patient had a C-section. Was not pleased she needed an earlier CS for preeclampsia, would have preferred her initial surgeon (does not like where the incision was placed). - Patient has urinary incontinence? No. - Patient is safe to resume physical and sexual activity  6.  Health Maintenance - HM due items addressed Yes - Last pap smear  Diagnosis  Date Value Ref Range Status  05/07/2023 (A)  Corrected   - Atypical squamous cells of undetermined significance (ASC-US)   Pap smear not done at today's visit.  -Breast Cancer screening indicated? No.   7. Chronic Disease/Pregnancy Condition follow up: Hypertension - PCP follow up as needed  Bernerd Limbo, CNM [facility] [employer], [facility]

## 2023-12-31 DIAGNOSIS — F53 Postpartum depression: Secondary | ICD-10-CM | POA: Diagnosis not present

## 2023-12-31 MED ORDER — NYSTATIN 100000 UNIT/GM EX CREA: 1.0000 | TOPICAL_CREAM | Freq: Two times a day (BID) | CUTANEOUS | 0 refills | Status: DC

## 2024-01-01 ENCOUNTER — Other Ambulatory Visit: Payer: Self-pay

## 2024-01-01 ENCOUNTER — Ambulatory Visit (INDEPENDENT_AMBULATORY_CARE_PROVIDER_SITE_OTHER): Admitting: General Practice

## 2024-01-01 VITALS — BP 130/81 | HR 70 | Ht 74.0 in | Wt 376.0 lb

## 2024-01-01 DIAGNOSIS — Z5189 Encounter for other specified aftercare: Secondary | ICD-10-CM

## 2024-01-01 NOTE — Consult Note (Signed)
 LC to room per RN request.  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  -Wants to continue with latching and see how it goes after baby's oral restrictions are released on March 21 st.   LC Counseled on: -Empathized with challenges and commended mom for efforts. -Reviewed support for if mom would like to continue or if she would like to stop lactating. -Reviewed health benefits in any amount of milk she was able to provide with latching or pumping, if desired -Reviewed potential benefits of oral restrictions evaluations/releases, that are not just lactation related -Requested mom call us, if baby does have oral restriction releases, so we can assist with providing support with oral exercises and suck training.  Encouraged to call anytime with lactation questions or concerns.  Follow up as needed.  Juventino Slovak BSW IBCLC

## 2024-01-01 NOTE — Progress Notes (Signed)
 Patient presents to office today for follow up wound check after her appt on Wednesday of this week. Dr Nobie Putnam will come in to assess patient.   Chase Caller RN BSN 01/01/24

## 2024-01-05 DIAGNOSIS — F439 Reaction to severe stress, unspecified: Secondary | ICD-10-CM | POA: Diagnosis not present

## 2024-01-06 DIAGNOSIS — M25562 Pain in left knee: Secondary | ICD-10-CM | POA: Diagnosis not present

## 2024-01-06 DIAGNOSIS — F53 Postpartum depression: Secondary | ICD-10-CM | POA: Diagnosis not present

## 2024-01-06 DIAGNOSIS — Z5989 Other problems related to housing and economic circumstances: Secondary | ICD-10-CM | POA: Diagnosis not present

## 2024-01-07 NOTE — BH Specialist Note (Unsigned)
 Integrated Behavioral Health via Telemedicine Visit  01/21/2024 Maureen Lewis [id number]  Number of Integrated Behavioral Health Clinician visits: 1- Initial Visit  Session Start time: [phone]   Session End time: 0900  Total time in minutes: 44   Referring Provider: Jaynie Collins, MD Patient/Family location: [facility] Provider location: [facility]  All persons participating in visit: Patient Maureen Lewis and [facility] Valente Fosberg   Types of Service: Individual psychotherapy and Video visit  I connected with Maureen Lewis and/or Maureen Lewis's  n/a  via  Telephone or Video Enabled Telemedicine Application  (Video is Caregility application) and verified that I am speaking with the correct person using two identifiers. Discussed confidentiality: Yes   I discussed the limitations of telemedicine and the availability of in person appointments.  Discussed there is a possibility of technology failure and discussed alternative modes of communication if that failure occurs.  I discussed that engaging in this telemedicine visit, they consent to the provision of behavioral healthcare and the services will be billed under their insurance.  Patient and/or legal guardian expressed understanding and consented to Telemedicine visit: Yes   Presenting Concerns: Patient and/or family reports the following symptoms/concerns: Overwhelmed, depressed, fatigue, irritability; attributes to life stress (difficulty finding housing; current housing has black mold; car problems); difficulty accepting help from others ; pt copes by going outside as much as able and walking away from stressful situations; good support from baby's father, mom and friends.  Duration of problem: Postpartum; Severity of problem: moderate  Patient and/or Family's Strengths/Protective Factors: Social connections, Social and Emotional competence,  and Sense of purpose  Goals Addressed: Patient will:  Reduce symptoms of: depression and stress   Increase knowledge and/or ability of: stress reduction   Demonstrate ability to: Increase healthy adjustment to current life circumstances, Increase adequate support systems for patient/family, and Increase motivation to adhere to plan of care  Progress towards Goals: Ongoing  Interventions: Interventions utilized:  Solution-Focused Strategies, Psychoeducation and/or Health Education, Link to [employer], and Supportive Reflection Standardized Assessments completed: GAD-7 and PHQ 9  Patient and/or Family Response: Patient agrees with treatment plan.   Assessment: Patient currently experiencing Adjustment disorder with depressed mood; Psychosocial stress.   Patient may benefit from psychoeducation and brief therapeutic interventions regarding coping with symptoms of depression and life stress .  Plan: Follow up with behavioral health clinician on : Three weeks Behavioral recommendations:  --Continue prioritizing healthy self-care (regular meals, adequate rest; allowing practical help from supportive friends and family)  -Consider new mom support group as needed at either [email or web address].net or [email or web address]  -Continue working with [employer] -Continue maintaining a hopeful outlook; spending time outdoors as much as able -Read through information on After Visit Summary; use as needed and discussed  Referral(s): Integrated Art gallery manager (In Clinic) and [employer]:  Housing, Transportation, and Childcare  I discussed the assessment and treatment plan with the patient and/or parent/guardian. They were provided an opportunity to ask questions and all were answered. They agreed with the plan and demonstrated an understanding of the instructions.   They were advised to call back or seek an in-person evaluation if the symptoms worsen or  if the condition fails to improve as anticipated.  Valetta Close Arelene Moroni, LCSW  .    01/21/2024    8:37 AM 08/14/2023    3:26 PM 05/07/2023   11:17 AM 07/15/2022    4:22 PM 05/02/2022  11:09 AM  Depression screen PHQ 2/9  Decreased Interest 0 0 0 0 2  Down, Depressed, Hopeless 1 0 0 1 1  PHQ - 2 Score 1 0 0 1 3  Altered sleeping 0  0 1 2  Tired, decreased energy 3  3 1 3   Change in appetite 3  3 0 0  Feeling bad or failure about yourself  0  0 0 1  Trouble concentrating 0  0 0 1  Moving slowly or fidgety/restless 0  0 0 0  Suicidal thoughts 0  0 0 1  PHQ-9 Score 7  6 3 11   Difficult doing work/chores    Somewhat difficult Somewhat difficult      01/21/2024    8:41 AM 05/07/2023   11:17 AM  GAD 7 : Generalized Anxiety Score  Nervous, Anxious, on Edge 0 0  Control/stop worrying 0 0  Worry too much - different things 1 0  Trouble relaxing 0 0  Restless 0 0  Easily annoyed or irritable 3 0  Afraid - awful might happen 0 0  Total GAD 7 Score 4 0        12/30/2023    1:43 PM 12/10/2023   10:59 AM 12/09/2023    3:04 PM 12/04/2023    9:15 AM 12/02/2023    4:00 AM  Edinburgh Postnatal Depression Scale Screening Tool  I have been able to laugh and see the funny side of things. 0 0 0 0 0  I have looked forward with enjoyment to things. 0 0 0 0 0  I have blamed myself unnecessarily when things went wrong. 0 0 0 0 0  I have been anxious or worried for no good reason. 0 0 0 0 0  I have felt scared or panicky for no good reason. 0 0 0 0 0  Things have been getting on top of me. 2 0 0 1 1  I have been so unhappy that I have had difficulty sleeping. 0 0 0 0 0  I have felt sad or miserable. 2 0 0 0 0  I have been so unhappy that I have been crying. 2 0 0 0 0  The thought of harming myself has occurred to me. 0 0 0 0 0  Edinburgh Postnatal Depression Scale Total 6 0 0 1 1

## 2024-01-08 ENCOUNTER — Encounter (HOSPITAL_COMMUNITY): Payer: Self-pay | Admitting: Obstetrics & Gynecology

## 2024-01-08 ENCOUNTER — Inpatient Hospital Stay (HOSPITAL_COMMUNITY)

## 2024-01-08 ENCOUNTER — Inpatient Hospital Stay (HOSPITAL_COMMUNITY)
Admission: AD | Admit: 2024-01-08 | Discharge: 2024-01-09 | Disposition: A | Discharge status: Home / Self Care | Attending: Obstetrics & Gynecology | Admitting: Obstetrics & Gynecology

## 2024-01-08 DIAGNOSIS — I10 Essential (primary) hypertension: Secondary | ICD-10-CM

## 2024-01-08 DIAGNOSIS — I119 Hypertensive heart disease without heart failure: Secondary | ICD-10-CM | POA: Insufficient documentation

## 2024-01-08 DIAGNOSIS — R079 Chest pain, unspecified: Secondary | ICD-10-CM | POA: Diagnosis not present

## 2024-01-08 DIAGNOSIS — I517 Cardiomegaly: Secondary | ICD-10-CM | POA: Diagnosis not present

## 2024-01-08 LAB — COMPREHENSIVE METABOLIC PANEL
ALT: 10 U/L (ref 0–44)
AST: 12 U/L — ABNORMAL LOW (ref 15–41)
Albumin: 3.4 g/dL — ABNORMAL LOW (ref 3.5–5.0)
Alkaline Phosphatase: 87 U/L (ref 38–126)
Anion gap: 6 (ref 5–15)
BUN: 11 mg/dL (ref 6–20)
CO2: 28 mmol/L (ref 22–32)
Calcium: 8.7 mg/dL — ABNORMAL LOW (ref 8.9–10.3)
Chloride: 107 mmol/L (ref 98–111)
Creatinine, Ser: 0.62 mg/dL (ref 0.44–1.00)
GFR, Estimated: >60 mL/min (ref >60)
Glucose, Bld: 106 mg/dL — ABNORMAL HIGH (ref 70–99)
Potassium: 4.1 mmol/L (ref 3.5–5.1)
Sodium: 141 mmol/L (ref 135–145)
Total Bilirubin: 0.5 mg/dL (ref 0.0–1.2)
Total Protein: 6.8 g/dL (ref 6.5–8.1)

## 2024-01-08 LAB — CBC
HCT: 39.8 % (ref 36.0–46.0)
Hemoglobin: 12.4 g/dL (ref 12.0–15.0)
MCH: 24.2 pg — ABNORMAL LOW (ref 26.0–34.0)
MCHC: 31.2 g/dL (ref 30.0–36.0)
MCV: 77.7 fL — ABNORMAL LOW (ref 80.0–100.0)
Platelets: 199 10*3/uL (ref 150–400)
RBC: 5.12 MIL/uL — ABNORMAL HIGH (ref 3.87–5.11)
RDW: 14.5 % (ref 11.5–15.5)
WBC: 6.4 10*3/uL (ref 4.0–10.5)
nRBC: 0 % (ref 0.0–0.2)

## 2024-01-08 LAB — PROTEIN / CREATININE RATIO, URINE
Creatinine, Urine: 153 mg/dL
Total Protein, Urine: <6 mg/dL

## 2024-01-08 LAB — BRAIN NATRIURETIC PEPTIDE: B Natriuretic Peptide: 45.6 pg/mL (ref 0.0–100.0)

## 2024-01-08 LAB — TROPONIN I (HIGH SENSITIVITY): Troponin I (High Sensitivity): 3 ng/L (ref <18)

## 2024-01-08 MED ORDER — ACETAMINOPHEN-CAFFEINE 500-65 MG PO TABS
2.0000 | ORAL_TABLET | Freq: Once | ORAL | Status: AC
  Administered 2024-01-08: 2 via ORAL
  Filled 2024-01-08: qty 2

## 2024-01-08 MED ORDER — NIFEDIPINE 10 MG PO CAPS
20.0000 mg | ORAL_CAPSULE | Freq: Once | ORAL | Status: AC
  Administered 2024-01-08: 20 mg via ORAL
  Filled 2024-01-08: qty 2

## 2024-01-08 MED ORDER — ACETAMINOPHEN 500 MG PO TABS: 1000.0000 mg | ORAL_TABLET | Freq: Once | ORAL | Status: DC

## 2024-01-08 NOTE — MAU Provider Note (Signed)
 History     CSN: [id number]  Arrival date and time: 01/08/24 2056   Event Date/Time   First Provider Initiated Contact with Patient 01/08/24 2146      Chief Complaint  Patient presents with   Hypertension    Maureen Lewis is a 28 y.o.  at 6 weeks postpartum who receives care at [facility].  She presents today for hypertension.  She states she was having blurry vision and chest pains.  She states these are symptoms she experience with elevated blood pressure.  She states she checked her bp and it was elevated. She states she is taking amlodipine once a day. She states she was initially prescribed as twice daily, but only for 2 days.   She states she is having some chest pain and HA currently.  She states the chest pain is constant pressure and  She rates her pain a 8/10. She rates her HA a 5/10 and states this is improving, but feels like "someone is putting a weight on my head."   OB History     Gravida  4   Para  2   Term  2   Preterm  0   AB  2   Living  2      SAB  2   IAB  0   Ectopic  0   Multiple  0   Live Births  2           Past Medical History:  Diagnosis Date   Abnormal uterine bleeding    Acid reflux    Alpha thalassemia trait    Amenorrhea    Anemia    no current med.   Asthma    Back pain    Chest pain    resolved   Complication of anesthesia    states had to keep giving her anesthesia during EGD   Constipation    Depression    "I'm good"   Fibromyalgia    Finger mass, left 03/2017   middle finger   Gestational hypertension 09/23/2019   Guidelines for Antenatal Testing and Sonography  (with updated ICD-10 codes)  Updated  Sep 12, 2019 with Dr. Noralee Space  INDICATION U/S 2 X week NST/AFI  or full BPP wkly DELIVERY Diabetes   A1 - good control - O24.410    A2 - good control - O24.419      A2  - poor control or poor compliance - O24.419, E11.65   (Macrosomia or polyhydramnios) **E11.65 is extra code for poor control**    A2/B -  O24.   Headache    Hepatitis 07/18/2022   Hepatitis B   History of kidney stones    Hypertension    Irritable bowel syndrome (IBS)    Joint pain    Morbid obesity with body mass index (BMI) of 45.0 to 49.9 in adult [facility])    Plantar fasciitis, bilateral    resolved   Polycystic ovary syndrome    Pre-diabetes    no meds, diet controlled, diet not check blood sugar   Prediabetes 09/16/2017   Early 1hr 70     Shortness of breath    albuterol inhaler   Sleep apnea    no CPAP use   Vertigo 2017    Past Surgical History:  Procedure Laterality Date   CESAREAN SECTION N/A 10/09/2019   Procedure: CESAREAN SECTION;  Surgeon: Tusculum Bing, MD;  Location: [facility];  Service: Obstetrics;  Laterality: N/A;   CESAREAN SECTION WITH  BILATERAL TUBAL LIGATION N/A 12/01/2023   Procedure: CESAREAN SECTION WITH BILATERAL TUBAL LIGATION;  Surgeon: Kathrynn Running, MD;  Location: [facility];  Service: Obstetrics;  Laterality: N/A;   COLONOSCOPY WITH PROPOFOL  10/07/2016   DILATION AND CURETTAGE OF UTERUS     ESOPHAGOGASTRODUODENOSCOPY  12/21/2015   EXCISION MASS UPPER EXTREMETIES Left 04/21/2017   Procedure: EXCISION MASS LEFT MIDDLE FINGER;  Surgeon: Cindee Salt, MD;  Location: [facility];  Service: Orthopedics;  Laterality: Left;   gastric sleeve  05/09/2022   IR FL GUIDED LOC OF NEEDLE/CATH TIP FOR SPINAL INJECTION RT  03/13/2020   KNEE ARTHROSCOPY WITH LATERAL MENISECTOMY Left 10/31/2022   Procedure: KNEE ARTHROSCOPY WITH PARTIAL LATERAL MENISECTOMY;  Surgeon: Yolonda Kida, MD;  Location: [facility] ORS;  Service: Orthopedics;  Laterality: Left;  77   KNEE ARTHROSCOPY WITH LATERAL RELEASE Left 10/31/2022   Procedure: KNEE ARTHROSCOPY WITH LATERAL RELEASE AND LIMITED SYNOVECTOMY;  Surgeon: Yolonda Kida, MD;  Location: [facility] ORS;  Service: Orthopedics;  Laterality: Left;  77   PHOTOCOAGULATION WITH LASER Left 12/13/2020   Procedure: RETINOPLEXY LEFT EYE;  Surgeon: Stephannie Li, MD;  Location: [facility] OR;  Service: Ophthalmology;  Laterality: Left;   SCLERAL BUCKLE Right 12/13/2020   Procedure: SCLERAL BUCKLE WITH CRYO;  Surgeon: Stephannie Li, MD;  Location[facility];  Service: Ophthalmology;  Laterality: Right;    Family History  Problem Relation Age of Onset   Diabetes Maternal Aunt    Hypertension Maternal Uncle    Depression Maternal Grandfather    Diabetes Maternal Grandfather    Hypertension Maternal Grandfather    Arthritis Mother    High blood pressure Mother    Depression Mother    Sleep apnea Mother    Obesity Mother    Diabetes Mother    Hypertension Mother    Kidney failure Mother    Sleep apnea Father    Obesity Father    Healthy Daughter     Social History   Tobacco Use   Smoking status: Never   Smokeless tobacco: Never  Vaping Use   Vaping status: Never Used  Substance Use Topics   Alcohol use: Not Currently    Comment: social    Drug use: No    Allergies:  Allergies  Allergen Reactions   Bee Venom Hives, Shortness Of Breath and Swelling   Hydrocodone-Acetaminophen Anaphylaxis    No problem when she takes Tylenol   Peach Flavoring Agent (Non-Screening) Hives, Shortness Of Breath and Other (See Comments)    SWELLING OF MOUTH   Pollen Extract Hives, Shortness Of Breath and Swelling   Remdesivir Swelling    Angioedema 8/25   Latex Rash   Cortisone Itching and Swelling   Cortizone-10 [Hydrocortisone]     Swelling and itching   Toradol [Ketorolac Tromethamine] Swelling    Medications Prior to Admission  Medication Sig Dispense Refill Last Dose/Taking   amLODipine (NORVASC) 5 MG tablet Take twice daily (breakfast and bedtime) for two days then daily until your postpartum appointment. 30 tablet 1    nystatin cream (MYCOSTATIN) Apply 1 Application topically 2 (two) times daily. 30 g 0    Prenatal Vit-Fe Phos-FA-Omega (VITAFOL GUMMIES) 3.33-0.333-34.8 MG CHEW Chew 1 tablet by mouth daily. 90 tablet 3    Vitamin D,  Ergocalciferol, (DRISDOL) 1.25 MG (50000 UNIT) CAPS capsule TAKE 1 CAPSULE BY MOUTH EVERY 7 DAYS 10 capsule 0     Review of Systems  Eyes:  Positive for visual disturbance.  Cardiovascular:  Positive for chest pain.  Genitourinary:  Negative for difficulty urinating, dysuria, vaginal bleeding and vaginal discharge.  Neurological:  Positive for headaches. Negative for dizziness and light-headedness.   Physical Exam   Blood pressure (!) 126/92, pulse 87, temperature 98.2 F (36.8 C), resp. rate 18, height 6' 2.5" (1.892 m), weight (!) 173.3 kg, last menstrual period 03/07/2023, SpO2 99%, currently breastfeeding.  Vitals:   01/08/24 2148 01/08/24 2250 01/09/24 0021  BP: (!) 152/82 129/78 (!) 126/92    Physical Exam Vitals and nursing note reviewed. Exam conducted with a chaperone present Alvino Chapel, RN).  Constitutional:      General: She is not in acute distress.    Appearance: Normal appearance. She is obese.  HENT:     Head: Normocephalic and atraumatic.  Eyes:     Conjunctiva/sclera: Conjunctivae normal.  Cardiovascular:     Rate and Rhythm: Normal rate.  Pulmonary:     Effort: Pulmonary effort is normal.  Musculoskeletal:        General: Normal range of motion.     Cervical back: Normal range of motion.  Skin:    General: Skin is warm and dry.  Neurological:     Mental Status: She is alert and oriented to person, place, and time.  Psychiatric:        Mood and Affect: Mood normal.        Behavior: Behavior normal.     MAU Course  Procedures Results for orders placed or performed during the hospital encounter of 01/08/24 (from the past 24 hours)  CBC     Status: Abnormal   Collection Time: 01/08/24 10:18 PM  Result Value Ref Range   WBC 6.4 4.0 - 10.5 K/uL   RBC 5.12 (H) 3.87 - 5.11 MIL/uL   Hemoglobin 12.4 12.0 - 15.0 g/dL   HCT 16.1 09.6 - 04.5 %   MCV 77.7 (L) 80.0 - 100.0 fL   MCH 24.2 (L) 26.0 - 34.0 pg   MCHC 31.2 30.0 - 36.0 g/dL   RDW 40.9 81.1 - 91.4 %    Platelets 199 150 - 400 K/uL   nRBC 0.0 0.0 - 0.2 %  Comprehensive metabolic panel     Status: Abnormal   Collection Time: 01/08/24 10:18 PM  Result Value Ref Range   Sodium 141 135 - 145 mmol/L   Potassium 4.1 3.5 - 5.1 mmol/L   Chloride 107 98 - 111 mmol/L   CO2 28 22 - 32 mmol/L   Glucose, Bld 106 (H) 70 - 99 mg/dL   BUN 11 6 - 20 mg/dL   Creatinine, Ser 7.82 0.44 - 1.00 mg/dL   Calcium 8.7 (L) 8.9 - 10.3 mg/dL   Total Protein 6.8 6.5 - 8.1 g/dL   Albumin 3.4 (L) 3.5 - 5.0 g/dL   AST 12 (L) 15 - 41 U/L   ALT 10 0 - 44 U/L   Alkaline Phosphatase 87 38 - 126 U/L   Total Bilirubin 0.5 0.0 - 1.2 mg/dL   GFR, Estimated >95 >62 mL/min   Anion gap 6 5 - 15  Brain natriuretic peptide     Status: None   Collection Time: 01/08/24 10:18 PM  Result Value Ref Range   B Natriuretic Peptide 45.6 0.0 - 100.0 pg/mL  Troponin I (High Sensitivity)     Status: None   Collection Time: 01/08/24 10:18 PM  Result Value Ref Range   Troponin I (High Sensitivity) 3 <18 ng/L  Protein / creatinine ratio, urine  Status: None   Collection Time: 01/08/24 10:32 PM  Result Value Ref Range   Creatinine, Urine 153 mg/dL   Total Protein, Urine <6 mg/dL   Protein Creatinine Ratio        0.00 - 0.15 mg/mg[Cre]   *Note: Due to a large number of results and/or encounters for the requested time period, some results have not been displayed. A complete set of results can be found in Results Review.    MDM Physical Exam Labs: CBC, CMP, BNP, Troponin, PC Ratio EKG Pain Management Antihypertensives Consult CXR Prescription Referral Assessment and Plan  28 year old 6 weeks Postpartum CHTN  Chest Pain HA  -Reviewed POC with patient. -Exam performed and findings discussed.  -Patient offered and accepts pain medication.  -Discussed dose of Procardia, but plan to increase amlodipine to 10mg  daily upon discharge. -Will also give Excedrin Tension for HA.  -EKG ordered for c/o chest pain.   Cherre Robins MSN, CNM Advanced Practice Provider, Center for [facility] 01/08/2024, 9:46 PM   Reassessment (10:22 PM) -Consult with Dr. Macon Large who advises and modifies POC to include: *Labs: BNP, Troponin, CBC, CMP, and PCR *Chest X Ray *Agrees with remainder of plan as discussed. -Patient updated by nurse. -EKG reviewed by Dr. Vilinda Flake who states normal read.   Reassessment (12:09 AM) -Results as above. -Dr. Macon Large reviews and advises patient to receive referral to cardio-obs for postpartum management. -Reviewed results and recommendations with patient.  -Informed that bp is chronic problem that needs management long-term.  Patient encouraged to contact primary care provider.  -However will send message to [facility] for scheduling of bp check in 1-2 weeks.  -Rx for amlodipine modified. -Referral placed.  -Discharge to home in improved condition -Precautions reviewed.   Cherre Robins MSN, CNM Advanced Practice Provider, Center for [employer]

## 2024-01-08 NOTE — MAU Note (Signed)
.  Maureen Lewis is a 28 y.o. at Unknown here in MAU reporting headaches, blurry vision, and chest pain. Symptoms started earlier today. Taking Amlodipine once a day  LMP: 01/04/24 Onset of complaint: today Pain score: 8 for chest and 5 for headache Vitals:   01/08/24 2144 01/08/24 2148  BP:  (!) 152/82  Pulse: 70   Resp: 17   Temp: 98.2 F (36.8 C)   SpO2: 99%      FHT: n/a  Lab orders placed from triage: Gerrit Heck CNM in Triage to see pt at time of triage

## 2024-01-09 MED ORDER — AMLODIPINE BESYLATE 10 MG PO TABS
ORAL_TABLET | ORAL | 2 refills | Status: DC
Start: 2024-01-09 — End: 2024-01-12

## 2024-01-09 NOTE — Progress Notes (Signed)
 Written and verbal d/c instructions given and understanding voiced.

## 2024-01-11 DIAGNOSIS — Z903 Acquired absence of stomach [part of]: Secondary | ICD-10-CM | POA: Diagnosis not present

## 2024-01-11 DIAGNOSIS — Z6841 Body Mass Index (BMI) 40.0 and over, adult: Secondary | ICD-10-CM | POA: Diagnosis not present

## 2024-01-11 DIAGNOSIS — E66813 Obesity, class 3: Secondary | ICD-10-CM | POA: Diagnosis not present

## 2024-01-12 ENCOUNTER — Encounter: Payer: Self-pay | Admitting: Cardiology

## 2024-01-12 ENCOUNTER — Ambulatory Visit: Attending: Cardiology | Admitting: Cardiology

## 2024-01-12 VITALS — BP 124/62 | HR 76 | Ht 74.0 in | Wt 383.0 lb

## 2024-01-12 DIAGNOSIS — I10 Essential (primary) hypertension: Secondary | ICD-10-CM | POA: Diagnosis not present

## 2024-01-12 DIAGNOSIS — I517 Cardiomegaly: Secondary | ICD-10-CM

## 2024-01-12 DIAGNOSIS — Z79899 Other long term (current) drug therapy: Secondary | ICD-10-CM | POA: Diagnosis not present

## 2024-01-12 DIAGNOSIS — R0602 Shortness of breath: Secondary | ICD-10-CM | POA: Diagnosis not present

## 2024-01-12 MED ORDER — AMLODIPINE BESYLATE 10 MG PO TABS: 10.0000 mg | ORAL_TABLET | Freq: Every day | ORAL | 3 refills | Status: DC

## 2024-01-12 MED ORDER — HYDROCHLOROTHIAZIDE 12.5 MG PO CAPS
12.5000 mg | ORAL_CAPSULE | Freq: Every day | ORAL | 3 refills | Status: DC
Start: 2024-01-12 — End: 2024-04-12

## 2024-01-12 NOTE — Patient Instructions (Signed)
 Medication Instructions:  Your physician has recommended you make the following change in your medication:  START: Amlodipine 10 once daily START: Hydrochlorothiazide 12.5 mg once daily *If you need a refill on your cardiac medications before your next appointment, please call your pharmacy*   Lab Work: CMET, Mag If you have labs (blood work) drawn today and your tests are completely normal, you will receive your results only by: MyChart Message (if you have MyChart) OR A paper copy in the mail If you have any lab test that is abnormal or we need to change your treatment, we will call you to review the results.   Testing/Procedures: Your physician has requested that you have an echocardiogram. Echocardiography is a painless test that uses sound waves to create images of your heart. It provides your doctor with information about the size and shape of your heart and how well your heart's chambers and valves are working. This procedure takes approximately one hour. There are no restrictions for this procedure. Please do NOT wear cologne, perfume, aftershave, or lotions (deodorant is allowed). Please arrive 15 minutes prior to your appointment time.  Please note: We ask at that you not bring children with you during ultrasound (echo/ vascular) testing. Due to room size and safety concerns, children are not allowed in the ultrasound rooms during exams. Our front office staff cannot provide observation of children in our lobby area while testing is being conducted. An adult accompanying a patient to their appointment will only be allowed in the ultrasound room at the discretion of the ultrasound technician under special circumstances. We apologize for any inconvenience.    Follow-Up: At [facility], you and your health needs are our priority.  As part of our continuing mission to provide you with exceptional heart care, we have created designated Provider Care Teams.  These Care Teams  include your primary Cardiologist (physician) and Advanced Practice Providers (APPs -  Physician Assistants and Nurse Practitioners) who all work together to provide you with the care you need, when you need it.   Your next appointment:   12 week(s)  Provider:   Thomasene Ripple, DO     Other Instructions Please take your blood pressure daily for 2 weeks and send in a MyChart message. Please include heart rates. (One message at the end of the 2 weeks).   HOW TO TAKE YOUR BLOOD PRESSURE: Rest 5 minutes before taking your blood pressure. Don't smoke or drink caffeinated beverages for at least 30 minutes before. Take your blood pressure before (not after) you eat. Sit comfortably with your back supported and both feet on the floor (don't cross your legs). Elevate your arm to heart level on a table or a desk. Use the proper sized cuff. It should fit smoothly and snugly around your bare upper arm. There should be enough room to slip a fingertip under the cuff. The bottom edge of the cuff should be 1 inch above the crease of the elbow. Ideally, take 3 measurements at one sitting and record the average.

## 2024-01-12 NOTE — Progress Notes (Unsigned)
 Cardio-Obstetrics Clinic  Follow Up Note   Date:  01/13/2024   ID:  Maureen Lewis, DOB 07-May-1996, MRN [id number]  PCP:  Shirline Frees, NP   [facility] Providers Cardiologist:  Thomasene Ripple, DO  Electrophysiologist:  None        Referring MD: Gerrit Heck, CNM   Chief Complaint: " I am doing ok"  History of Present Illness:    Maureen Lewis is a 28 y.o. female [G4P2022] who returns for follow up of chronic hypertension.   Since her last visit with me she has delivered.  She is doing well.  But she had some postpartum hypertension her amlodipine was increased to 10 mg twice a day.  She had questions about her previous echocardiogram again we were able to talk about it.  Has some shortness of breath but not as bad.  Prior CV Studies Reviewed: The following studies were reviewed today: Echo   Past Medical History:  Diagnosis Date   Abnormal uterine bleeding    Acid reflux    Alpha thalassemia trait    Amenorrhea    Anemia    no current med.   Asthma    Back pain    Chest pain    resolved   Complication of anesthesia    states had to keep giving her anesthesia during EGD   Constipation    Depression    "I'm good"   Fibromyalgia    Finger mass, left 03/2017   middle finger   Gestational hypertension 09/23/2019   Guidelines for Antenatal Testing and Sonography  (with updated ICD-10 codes)  Updated  09-22-19 with Dr. Noralee Space  INDICATION U/S 2 X week NST/AFI  or full BPP wkly DELIVERY Diabetes   A1 - good control - O24.410    A2 - good control - O24.419      A2  - poor control or poor compliance - O24.419, E11.65   (Macrosomia or polyhydramnios) **E11.65 is extra code for poor control**    A2/B - O24.   Headache    Hepatitis 07/18/2022   Hepatitis B   History of kidney stones    Hypertension    Irritable bowel syndrome (IBS)    Joint pain    Morbid obesity with body mass index (BMI) of 45.0 to 49.9 in adult [facility])     Plantar fasciitis, bilateral    resolved   Polycystic ovary syndrome    Pre-diabetes    no meds, diet controlled, diet not check blood sugar   Prediabetes 09/16/2017   Early 1hr 70     Shortness of breath    albuterol inhaler   Sleep apnea    no CPAP use   Vertigo 2017    Past Surgical History:  Procedure Laterality Date   CESAREAN SECTION N/A 10/09/2019   Procedure: CESAREAN SECTION;  Surgeon: Salisbury Bing, MD;  Location: [facility];  Service: Obstetrics;  Laterality: N/A;   CESAREAN SECTION WITH BILATERAL TUBAL LIGATION N/A 12/01/2023   Procedure: CESAREAN SECTION WITH BILATERAL TUBAL LIGATION;  Surgeon: Kathrynn Running, MD;  Location: [facility];  Service: Obstetrics;  Laterality: N/A;   COLONOSCOPY WITH PROPOFOL  10/07/2016   DILATION AND CURETTAGE OF UTERUS     ESOPHAGOGASTRODUODENOSCOPY  12/21/2015   EXCISION MASS UPPER EXTREMETIES Left 04/21/2017   Procedure: EXCISION MASS LEFT MIDDLE FINGER;  Surgeon: Cindee Salt, MD;  Location: [facility];  Service: Orthopedics;  Laterality: Left;   gastric sleeve  05/09/2022   IR FL GUIDED LOC OF NEEDLE/CATH TIP FOR SPINAL INJECTION RT  03/13/2020   KNEE ARTHROSCOPY WITH LATERAL MENISECTOMY Left 10/31/2022   Procedure: KNEE ARTHROSCOPY WITH PARTIAL LATERAL MENISECTOMY;  Surgeon: Yolonda Kida, MD;  Location: [facility]S;  Service: Orthopedics;  Laterality: Left;  77   KNEE ARTHROSCOPY WITH LATERAL RELEASE Left 10/31/2022   Procedure: KNEE ARTHROSCOPY WITH LATERAL RELEASE AND LIMITED SYNOVECTOMY;  Surgeon: Yolonda Kida, MD;  Location: [facility]S;  Service: Orthopedics;  Laterality: Left;  75   PHOTOCOAGULATION WITH LASER Left 12/13/2020   Procedure: RETINOPLEXY LEFT EYE;  Surgeon: Stephannie Li, MD;  Location: [facility] OR;  Service: Ophthalmology;  Laterality: Left;   SCLERAL BUCKLE Right 12/13/2020   Procedure: SCLERAL BUCKLE WITH CRYO;  Surgeon: Stephannie Li, MD;  Location: [facility] OR;  Service: Ophthalmology;   Laterality: Right;      OB History     Gravida  4   Para  2   Term  2   Preterm  0   AB  2   Living  2      SAB  2   IAB  0   Ectopic  0   Multiple  0   Live Births  2               Current Medications: Current Meds  Medication Sig   amLODipine (NORVASC) 10 MG tablet Take 1 tablet (10 mg total) by mouth daily.   hydrochlorothiazide (MICROZIDE) 12.5 MG capsule Take 1 capsule (12.5 mg total) by mouth daily.   nystatin cream (MYCOSTATIN) Apply 1 Application topically 2 (two) times daily.   Prenatal Vit-Fe Phos-FA-Omega (VITAFOL GUMMIES) 3.33-0.333-34.8 MG CHEW Chew 1 tablet by mouth daily.   Vitamin D, Ergocalciferol, (DRISDOL) 1.25 MG (50000 UNIT) CAPS capsule TAKE 1 CAPSULE BY MOUTH EVERY 7 DAYS   [DISCONTINUED] amLODipine (NORVASC) 10 MG tablet Take twice daily (breakfast and bedtime) for two days then daily until your postpartum appointment.     Allergies:   Bee venom, Hydrocodone-acetaminophen, Peach flavoring agent (non-screening), Pollen extract, Remdesivir, Latex, Cortisone, Cortizone-10 [hydrocortisone], and Toradol [ketorolac tromethamine]   Social History   Socioeconomic History   Marital status: Single    Spouse name: Not on file   Number of children: Not on file   Years of education: Not on file   Highest education level: Not on file  Occupational History   Occupation: IT sales professional    Employer: [id number]  Tobacco Use   Smoking status: Never   Smokeless tobacco: Never  Vaping Use   Vaping status: Never Used  Substance and Sexual Activity   Alcohol use: Not Currently    Comment: social    Drug use: No   Sexual activity: Not Currently    Birth control/protection: None  Other Topics Concern   Not on file  Social History Narrative   Right handed    Soda sometimes   Lives with mom and cousin, a grandmother in [place] also helps care for her.       She is in nursing school.    Social Drivers of Health   Financial Resource Strain:  High Risk (11/05/2022)   Received from [facility], [facility]   Overall Financial Resource Strain (CARDIA)    Difficulty of Paying Living Expenses: Hard  Food Insecurity: Food Insecurity Present (12/04/2023)   Hunger Vital Sign    Worried About Running Out of Food in the Last Year: Often true    Ran Out  of Food in the Last Year: Often true  Transportation Needs: No Transportation Needs (12/04/2023)   PRAPARE - Administrator, Civil Service (Medical): No    Lack of Transportation (Non-Medical): No  Physical Activity: Unknown (11/05/2022)   Received from [facility]   Exercise Vital Sign    Days of Exercise per Week: 0 days    Minutes of Exercise per Session: Not on file  Recent Concern: Physical Activity - Inactive (11/05/2022)   Received from [facility], [facility]   Exercise Vital Sign    Days of Exercise per Week: 0 days    Minutes of Exercise per Session: 40 min  Stress: Stress Concern Present (11/05/2022)   Received from [facility], [facility] - Occupational Stress Questionnaire    Feeling of Stress : Rather much  Social Connections: Somewhat Isolated (11/05/2022)   Received from [facility], [facility]   Social Network    How would you rate your social network (family, work, friends)?: Restricted participation with some degree of social isolation      Family History  Problem Relation Age of Onset   Diabetes Maternal Aunt    Hypertension Maternal Uncle    Depression Maternal Grandfather    Diabetes Maternal Grandfather    Hypertension Maternal Grandfather    Arthritis Mother    High blood pressure Mother    Depression Mother    Sleep apnea Mother    Obesity Mother    Diabetes Mother    Hypertension Mother    Kidney failure Mother    Sleep apnea Father    Obesity Father    Healthy Daughter       ROS:   Please see the history of present illness.     All other systems reviewed and are  negative.   Labs/EKG Reviewed:    EKG:   EKG was not ordered today.    Recent Labs: 05/07/2023: TSH 0.661 01/08/2024: B Natriuretic Peptide 45.6; Hemoglobin 12.4; Platelets 199 01/12/2024: ALT 9; BUN 8; Creatinine, Ser 0.64; Magnesium 2.0; Potassium 4.3; Sodium 141   Recent Lipid Panel Lab Results  Component Value Date/Time   CHOL 118 11/08/2020 11:31 AM   CHOL 156 04/09/2020 12:52 PM   TRIG 64.0 11/08/2020 11:31 AM   HDL 36.00 (L) 11/08/2020 11:31 AM   HDL 42 04/09/2020 12:52 PM   CHOLHDL 3 11/08/2020 11:31 AM   LDLCALC 69 11/08/2020 11:31 AM   LDLCALC 101 (H) 04/09/2020 12:52 PM    Physical Exam:    VS:  BP 124/62 (BP Location: Left Arm, Patient Position: Sitting, Cuff Size: Large)   Pulse 76   Ht 6\' 2"  (1.88 m)   Wt (!) 383 lb (173.7 kg)   LMP 03/07/2023   SpO2 94%   BMI 49.17 kg/m     Wt Readings from Last 3 Encounters:  01/12/24 (!) 383 lb (173.7 kg)  01/08/24 (!) 382 lb (173.3 kg)  01/01/24 (!) 376 lb (170.6 kg)     GEN:  Well nourished, well developed in no acute distress HEENT: Normal NECK: No JVD; No carotid bruits LYMPHATICS: No lymphadenopathy CARDIAC: RRR, no murmurs, rubs, gallops RESPIRATORY:  Clear to auscultation without rales, wheezing or rhonchi  ABDOMEN: Soft, non-tender, non-distended MUSCULOSKELETAL:  No edema; No deformity  SKIN: Warm and dry NEUROLOGIC:  Alert and oriented x 3 PSYCHIATRIC:  Normal affect    Risk Assessment/Risk Calculators:  ASSESSMENT & PLAN:    Postpartum hypertension I suspect this chronic hypertension.  She was placed on amlodipine 10 mg twice daily this dosing needs to be adjusted and changed.  With drop her down to amlodipine 10 mg daily and then add hydrochlorothiazide 12.5 mg daily.  She is not breast-feeding.  Will get a repeat echocardiogram echocardiogram in 2022 showed mild RV dilatation.  The patient understands the need to lose weight with diet and exercise. We have discussed specific  strategies for this.  Patient Instructions  Medication Instructions:  Your physician has recommended you make the following change in your medication:  START: Amlodipine 10 once daily START: Hydrochlorothiazide 12.5 mg once daily *If you need a refill on your cardiac medications before your next appointment, please call your pharmacy*   Lab Work: CMET, Mag If you have labs (blood work) drawn today and your tests are completely normal, you will receive your results only by: MyChart Message (if you have MyChart) OR A paper copy in the mail If you have any lab test that is abnormal or we need to change your treatment, we will call you to review the results.   Testing/Procedures: Your physician has requested that you have an echocardiogram. Echocardiography is a painless test that uses sound waves to create images of your heart. It provides your doctor with information about the size and shape of your heart and how well your heart's chambers and valves are working. This procedure takes approximately one hour. There are no restrictions for this procedure. Please do NOT wear cologne, perfume, aftershave, or lotions (deodorant is allowed). Please arrive 15 minutes prior to your appointment time.  Please note: We ask at that you not bring children with you during ultrasound (echo/ vascular) testing. Due to room size and safety concerns, children are not allowed in the ultrasound rooms during exams. Our front office staff cannot provide observation of children in our lobby area while testing is being conducted. An adult accompanying a patient to their appointment will only be allowed in the ultrasound room at the discretion of the ultrasound technician under special circumstances. We apologize for any inconvenience.    Follow-Up: At [facility], you and your health needs are our priority.  As part of our continuing mission to provide you with exceptional heart care, we have created  designated Provider Care Teams.  These Care Teams include your primary Cardiologist (physician) and Advanced Practice Providers (APPs -  Physician Assistants and Nurse Practitioners) who all work together to provide you with the care you need, when you need it.   Your next appointment:   12 week(s)  Provider:   Thomasene Ripple, DO     Other Instructions Please take your blood pressure daily for 2 weeks and send in a MyChart message. Please include heart rates. (One message at the end of the 2 weeks).   HOW TO TAKE YOUR BLOOD PRESSURE: Rest 5 minutes before taking your blood pressure. Don't smoke or drink caffeinated beverages for at least 30 minutes before. Take your blood pressure before (not after) you eat. Sit comfortably with your back supported and both feet on the floor (don't cross your legs). Elevate your arm to heart level on a table or a desk. Use the proper sized cuff. It should fit smoothly and snugly around your bare upper arm. There should be enough room to slip a fingertip under the cuff. The bottom edge of the cuff should be 1 inch above the crease of  the elbow. Ideally, take 3 measurements at one sitting and record the average.        Dispo:  No follow-ups on file.   Medication Adjustments/Labs and Tests Ordered: Current medicines are reviewed at length with the patient today.  Concerns regarding medicines are outlined above.  Tests Ordered: Orders Placed This Encounter  Procedures   Comprehensive Metabolic Panel (CMET)   Magnesium   ECHOCARDIOGRAM COMPLETE   Medication Changes: Meds ordered this encounter  Medications   amLODipine (NORVASC) 10 MG tablet    Sig: Take 1 tablet (10 mg total) by mouth daily.    Dispense:  90 tablet    Refill:  3   hydrochlorothiazide (MICROZIDE) 12.5 MG capsule    Sig: Take 1 capsule (12.5 mg total) by mouth daily.    Dispense:  90 capsule    Refill:  3

## 2024-01-12 NOTE — Congregational Nurse Program (Signed)
 Client to RN office for help with housing resources and disability for her mom. She states her home is no longer livable due to black mold being identified in her home. She is urgently looking for a place to stay to be able to support her family and children. One being a newborn and the other child is starting to experience respiratory issues related to housing. There are other children in the home that she is taking care of and providing for. She states her health has been declining and she was recently went to hospital due to blood pressure issues and says she was told her heart is enlarged. She has called several organizations including [employer] and they were unable to help her. She has looked on every housing resource site and website for affordable housing for her family size of 8. RN called [employer] to assist and [employer] both recommended calling Chucky May, and voicemail left to call back. She was also emailed a copy of [employer] with all affordable housing options listed. [employer] consulted and recommend client to contact DSS to assist and CPS to possibly assist with housing resources.  RN gave her disability advocacy card to work on application for her mother.  She was advised on her health and educated on stress management despite all these heavy responsibilities she is caring in order to for her family responsibly. RN will continue following up with her and continue helping her get the resources she needs.

## 2024-01-13 ENCOUNTER — Ambulatory Visit: Admitting: Adult Health

## 2024-01-13 DIAGNOSIS — M25571 Pain in right ankle and joints of right foot: Secondary | ICD-10-CM | POA: Diagnosis not present

## 2024-01-13 DIAGNOSIS — M545 Low back pain, unspecified: Secondary | ICD-10-CM | POA: Diagnosis not present

## 2024-01-13 DIAGNOSIS — Z79899 Other long term (current) drug therapy: Secondary | ICD-10-CM | POA: Diagnosis not present

## 2024-01-13 DIAGNOSIS — M546 Pain in thoracic spine: Secondary | ICD-10-CM | POA: Diagnosis not present

## 2024-01-13 LAB — COMPREHENSIVE METABOLIC PANEL
ALT: 9 IU/L (ref 0–32)
AST: 16 IU/L (ref 0–40)
Albumin: 4 g/dL (ref 4.0–5.0)
Alkaline Phosphatase: 120 IU/L (ref 44–121)
BUN/Creatinine Ratio: 13 (ref 9–23)
BUN: 8 mg/dL (ref 6–20)
Bilirubin Total: 0.4 mg/dL (ref 0.0–1.2)
CO2: 24 mmol/L (ref 20–29)
Calcium: 9 mg/dL (ref 8.7–10.2)
Chloride: 104 mmol/L (ref 96–106)
Creatinine, Ser: 0.64 mg/dL (ref 0.57–1.00)
Globulin, Total: 2.7 g/dL (ref 1.5–4.5)
Glucose: 85 mg/dL (ref 70–99)
Potassium: 4.3 mmol/L (ref 3.5–5.2)
Sodium: 141 mmol/L (ref 134–144)
Total Protein: 6.7 g/dL (ref 6.0–8.5)
eGFR: 126 mL/min/{1.73_m2} (ref >59)

## 2024-01-13 LAB — MAGNESIUM: Magnesium: 2 mg/dL (ref 1.6–2.3)

## 2024-01-13 NOTE — Progress Notes (Deleted)
   Subjective:    Patient ID: Maureen Lewis, female    DOB: 08-Mar-1996, 28 y.o.   MRN: [id number]  HPI    Review of Systems     Objective:   Physical Exam        Assessment & Plan:

## 2024-01-15 ENCOUNTER — Other Ambulatory Visit: Payer: Self-pay

## 2024-01-15 ENCOUNTER — Ambulatory Visit

## 2024-01-15 VITALS — BP 137/78 | HR 71 | Ht 74.0 in | Wt 380.0 lb

## 2024-01-15 DIAGNOSIS — F53 Postpartum depression: Secondary | ICD-10-CM | POA: Diagnosis not present

## 2024-01-15 DIAGNOSIS — Z5989 Other problems related to housing and economic circumstances: Secondary | ICD-10-CM | POA: Diagnosis not present

## 2024-01-15 DIAGNOSIS — Z4889 Encounter for other specified surgical aftercare: Secondary | ICD-10-CM

## 2024-01-15 NOTE — Progress Notes (Signed)
 Patient is here today for wound check per Dr. Nobie Putnam. Patient had c-section on 12/01/23. Patient reports applying powder to prevent moisture at incision site. Incision is well heading clean, dry and intact. Dr. Nobie Putnam at bedside to assess incision.   Marcelino Duster, RN

## 2024-01-19 ENCOUNTER — Encounter: Payer: Self-pay | Admitting: Family Medicine

## 2024-01-19 NOTE — Telephone Encounter (Signed)
 Letter given in office 01/19/24.

## 2024-01-20 ENCOUNTER — Ambulatory Visit: Payer: Self-pay | Admitting: Obstetrics and Gynecology

## 2024-01-20 DIAGNOSIS — F439 Reaction to severe stress, unspecified: Secondary | ICD-10-CM | POA: Diagnosis not present

## 2024-01-21 ENCOUNTER — Ambulatory Visit: Payer: Self-pay | Admitting: Clinical

## 2024-01-21 ENCOUNTER — Encounter: Payer: Self-pay | Admitting: Cardiology

## 2024-01-21 ENCOUNTER — Ambulatory Visit

## 2024-01-21 DIAGNOSIS — F4321 Adjustment disorder with depressed mood: Secondary | ICD-10-CM

## 2024-01-21 DIAGNOSIS — F53 Postpartum depression: Secondary | ICD-10-CM | POA: Diagnosis not present

## 2024-01-21 DIAGNOSIS — Z658 Other specified problems related to psychosocial circumstances: Secondary | ICD-10-CM

## 2024-01-21 DIAGNOSIS — Z6379 Other stressful life events affecting family and household: Secondary | ICD-10-CM | POA: Diagnosis not present

## 2024-01-21 NOTE — Patient Instructions (Signed)
 Center for [facility] at [facility] for Women [address] [place], [place] [address] [phone] (main office) [phone] [facility] office)  New Parent Support Groups www.postpartum.net www.conehealthybaby.com  [place] Copy  (Childcare options, Early childcare development, etc.) www.guilfordchildren.org  [employer] Child Care Facility Search Engine  https://ncchildcare.http://cook.com/  Transportation Resources [place] [employer] ([place]) [address] [place] [place], [place], [place] [address] https://www.[place].gov/departments/transportation/gdot-divisions/[place]-transit-agency-public-transportation-division     Fixed-route bus services, including regional fare cards for PART, [place], [place], and WSTA buses.  Reduced fare bus ID's available for Medicaid, Medicare, and "orange card" recipients.  SCAT offers curb-to-curb and door-to-door bus services for people with disabilities who are unable to use a fixed-bus route; also offers a shared-ride program.   Helpful tips:  -Routes available online and physical maps available at the main bus hub lobby (each for a specific route) -Smartphone directions often include bus routes (see the "bus" icon, next to the "car" and "walk" icons) -Routes differ on weekends, evenings and holidays, so plan ahead!  -If you have Medicaid, Medicare, or orange card, plan to obtain a reduced-fare ID to save 50% on rides. Check days and times to obtain an ID, and bring all necessary documents.   [employer] [place]) [address] [place], [place]. [address] [place], [place] [address] [phone] SpotApps.nl **Fixed-bus route services, and demand response bus service for older adults  [employer] [address] [place], [place] [address] [phone] www.wheels4hope.org **REFERRAL NEEDED by specific agencies (see  website), after meeting specified criteria only  [employer] for [employer]) [address], [place], [place] [address] [phone]  BuyingShow.es  *Regional fixed-bus routes between counties (example: [place] to [place]) and [employer] (serves [place], [place], [place], [place], [place], [place], [place], [place], [place], [place], [place], [place], and [place] counties) [address] [place], [place] [address] [phone] DeveloperU.ch  **Rental assistance, Home Rehabilitation,Weatherization Assistance Program, Chief Financial Officer, Housing Voucher Program  [employer] for Housing and [employer] Studies: Museum/gallery curator to residents of [place], [place], and [place] Make sure you have your documents ready, including:  (Household income verification: 2 months pay stubs, unemployment/social security award letter, statement of no income for all household members over 47) Photo ID for all household members over 18 Utility Bill/Rent Ledger/Lease: must show past due amount for utilities/rent, or the rental agreement if rent is current 2. Start your application online or by paper (in [place] or [place]) at:     https://www.castaneda.biz/  3. Once you have completed the online application, you will get an email confirmation message from the county. Expect to hear back by phone or email at least 6-10 weeks from submitting your application.  4. While you wait:  Call [phone] to check in on your application Let your landlord know that you've applied. Your landlord will be asked to submit documents (W-9) during this application process. Payments will be made directly to the landlord/property management company and utility company Rent or utility assistance for  [employer], [place], and [place] Apply at https://rb.gy/dvxbfv Questions? Call or email Renee at [phone] or [email or web address]   Eviction Mediation Program: The HOPE Program Https://www.rebuild.BedroomRental.com.cy HOPE Progam serves low-income renters in [address] [place] counties, defined as less than or equal to 80% of the area median income  for the county where the renter lives. In the following 12 counties, you should apply to your local rent and utility assistance program INSTEAD OF the HOPE Program: [place], [place], [place], [place], [place], [place], [place], [place], [place], [place], [place], [place]  If you live outside of [place], contact HOPE call center at [phone] to talk to a Program Representative Monday-Friday, 8am-5pm Note that Native American tribes also received federal funding for rent and utility assistance programs. Recognized members of the following tribes will be served by programs managed by tribal governments, including: [place] Indians, [place], [place], [place] of [place] and [facility] Eviction Mediation Coordinator, [place], [phone] [email or web address] .[employer], [place], [phone] [email or web address]   Housing Resources [facility] [place] [address], [place], [place] [address] [phone] www.gha-[place].[facility] [address] [place], [place] [address] [phone] PhoneCaptions.ch **Programs include: Hospital doctor and Housing Counseling, Healthy Radiographer, therapeutic, Homeless Prevention and Housing Assistance  Government [facility] [address], [place], [place] [address] [phone] www.PaintingEmporium.co.za **housing applications/recertification; tax payment relief/exemption under specific qualifications  [facility] [address] [place], [place] [address] www.onlinegreensboro.com/~maryshouse **transitional housing for women in recovery who have minor children or are pregnant  [facility] [address] [place], [place], [place] [address] https://johnson-smith.net/  **emergency shelter and support services for families facing homelessness  [facility] [address], [place], [place] [address] [phone] www.youthfocus.org **transitional housing to pregnant women; emergency housing for youth who have run away, are experiencing a family crisis, are victims of abuse or neglect, or are homeless  [facility] [address], [place], [place] [address] [phone] ircgso.org **Drop-in center for people experiencing homelessness; overnight warming center when temperature is 25 degrees or below  Re-Entry Staffing [address] [place], [place], [place] [address] [phone] https://reentrystaffingagency.org/ **help with affordable housing to people experiencing homelessness or unemployment due to incarceration  [facility] [address], [place], [place] [address] [phone] www.greensborourbanministry.org  **emergency and transitional housing, rent/mortgage assistance, utility assistance  [facility] [place] [address], [place], [place] [address] [phone] www.salvationarmyofgreensboro.org **emergency and transitional housing  H[employer] for [employer] [address], [place], [place] [address] [phone] Www.habitatgreensboro.[facility]   [employer] [address], [place], [place] [address] [phone] https://chshousing.org **Home Ownership/Affordable Housing Program and [facility]  Housing Consultants Group [address], [place], [place] [address] [phone] PaidValue.com.cy **home buyer education courses, foreclosure  prevention  [facility] [address], [place], [place] [address] [phone] DefMagazine.is **Environmental Exposure Assessment (investigation of homes where either children or pregnant women with a confirmed elevated blood lead level reside)  [facility] of [facility] [address] [place], [place] [address] [phone] ShowReturn.ca **Home Expense Assistance/Repairs Program; offers home accessibility updates, such as ramps or bars in the bathroom  Self-Help [facility] [address] [place], [place] [address] [phone] https://www.self-help.org/locations/[place]-branch **Offers credit-building and banking services to people unable to use traditional banking

## 2024-01-22 ENCOUNTER — Other Ambulatory Visit: Payer: Self-pay

## 2024-01-22 ENCOUNTER — Ambulatory Visit (INDEPENDENT_AMBULATORY_CARE_PROVIDER_SITE_OTHER)

## 2024-01-22 VITALS — BP 119/69 | HR 75 | Wt 387.4 lb

## 2024-01-22 DIAGNOSIS — E559 Vitamin D deficiency, unspecified: Secondary | ICD-10-CM | POA: Diagnosis not present

## 2024-01-22 DIAGNOSIS — Z6841 Body Mass Index (BMI) 40.0 and over, adult: Secondary | ICD-10-CM | POA: Diagnosis not present

## 2024-01-22 DIAGNOSIS — I10 Essential (primary) hypertension: Secondary | ICD-10-CM

## 2024-01-22 DIAGNOSIS — M25571 Pain in right ankle and joints of right foot: Secondary | ICD-10-CM | POA: Diagnosis not present

## 2024-01-22 DIAGNOSIS — M51362 Other intervertebral disc degeneration, lumbar region with discogenic back pain and lower extremity pain: Secondary | ICD-10-CM | POA: Diagnosis not present

## 2024-01-22 DIAGNOSIS — Z79899 Other long term (current) drug therapy: Secondary | ICD-10-CM | POA: Diagnosis not present

## 2024-01-22 DIAGNOSIS — N914 Secondary oligomenorrhea: Secondary | ICD-10-CM | POA: Diagnosis not present

## 2024-01-22 DIAGNOSIS — G8929 Other chronic pain: Secondary | ICD-10-CM | POA: Diagnosis not present

## 2024-01-22 DIAGNOSIS — R7303 Prediabetes: Secondary | ICD-10-CM | POA: Diagnosis not present

## 2024-01-22 NOTE — Progress Notes (Signed)
 Blood Pressure Check Visit  Maureen Lewis is here for blood pressure check following c-section on 12/01/23 in setting of chronic hypertension. Currently taking amlodipine 10 mg daily and hydrochlorothiazide 12.5 mg daily. BP today is 119/69. Will return on 01/27/24 for visit with Edd Arbour, CNM.  Marjo Bicker, RN 01/22/2024  10:34 AM

## 2024-01-26 DIAGNOSIS — Z79899 Other long term (current) drug therapy: Secondary | ICD-10-CM | POA: Diagnosis not present

## 2024-01-27 ENCOUNTER — Other Ambulatory Visit: Payer: Self-pay

## 2024-01-27 ENCOUNTER — Ambulatory Visit: Admitting: Certified Nurse Midwife

## 2024-01-27 ENCOUNTER — Encounter: Payer: Self-pay | Admitting: Certified Nurse Midwife

## 2024-01-27 VITALS — BP 124/84 | HR 80 | Wt 391.3 lb

## 2024-01-27 DIAGNOSIS — R87610 Atypical squamous cells of undetermined significance on cytologic smear of cervix (ASC-US): Secondary | ICD-10-CM | POA: Diagnosis not present

## 2024-01-27 DIAGNOSIS — Z98891 History of uterine scar from previous surgery: Secondary | ICD-10-CM

## 2024-01-27 DIAGNOSIS — Z1331 Encounter for screening for depression: Secondary | ICD-10-CM

## 2024-01-27 DIAGNOSIS — B3731 Acute candidiasis of vulva and vagina: Secondary | ICD-10-CM | POA: Diagnosis not present

## 2024-01-27 DIAGNOSIS — I10 Essential (primary) hypertension: Secondary | ICD-10-CM | POA: Diagnosis not present

## 2024-01-27 IMAGING — DX DG HIP (WITH OR WITHOUT PELVIS) 2-3V*R*
3 series · 4 of 4 positions shown · non-contrast
Comparison: None Available.

CLINICAL DATA: Right hip pain for 3 weeks.

EXAM:
DG HIP (WITH OR WITHOUT PELVIS) 2-3V RIGHT

[Series 1: pelvis ap · 0.14mm/px · 2 of 2 slices shown]
[im 1/2]
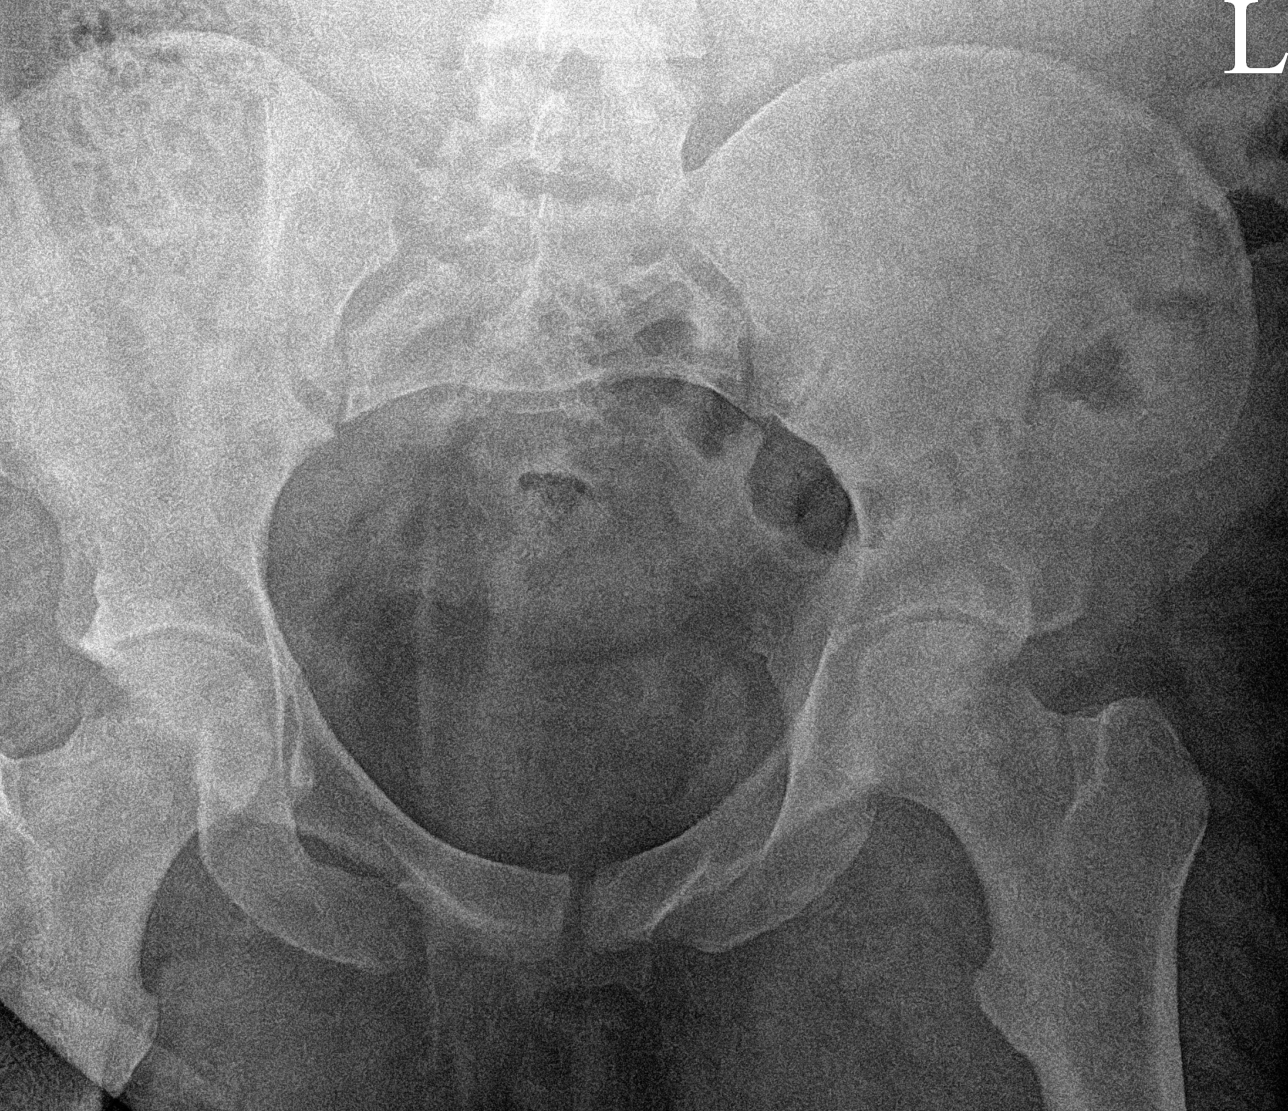
[im 2/2]
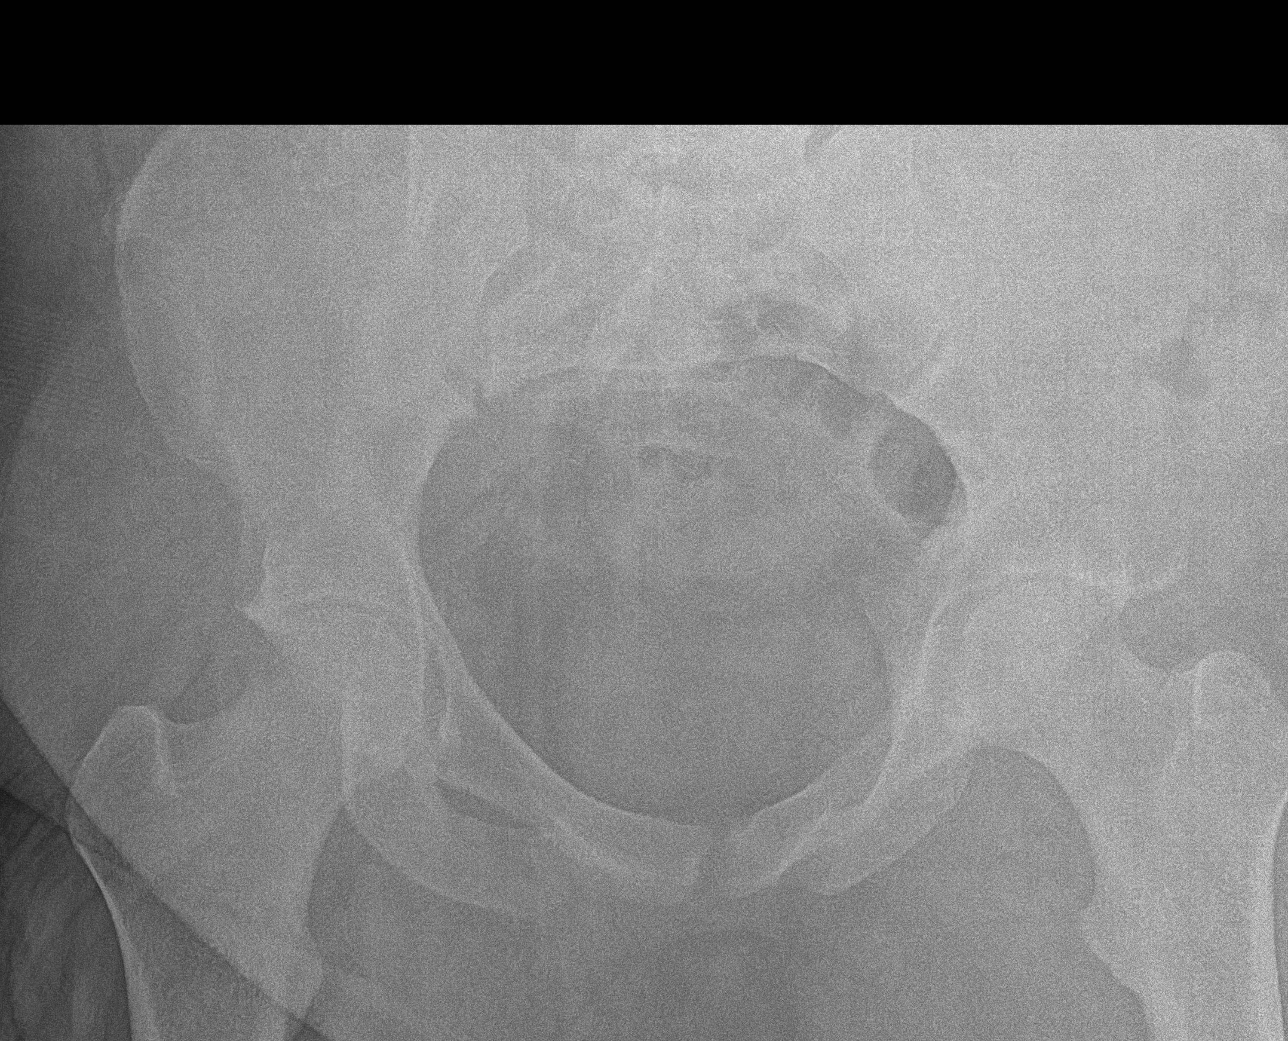

[hip ap]
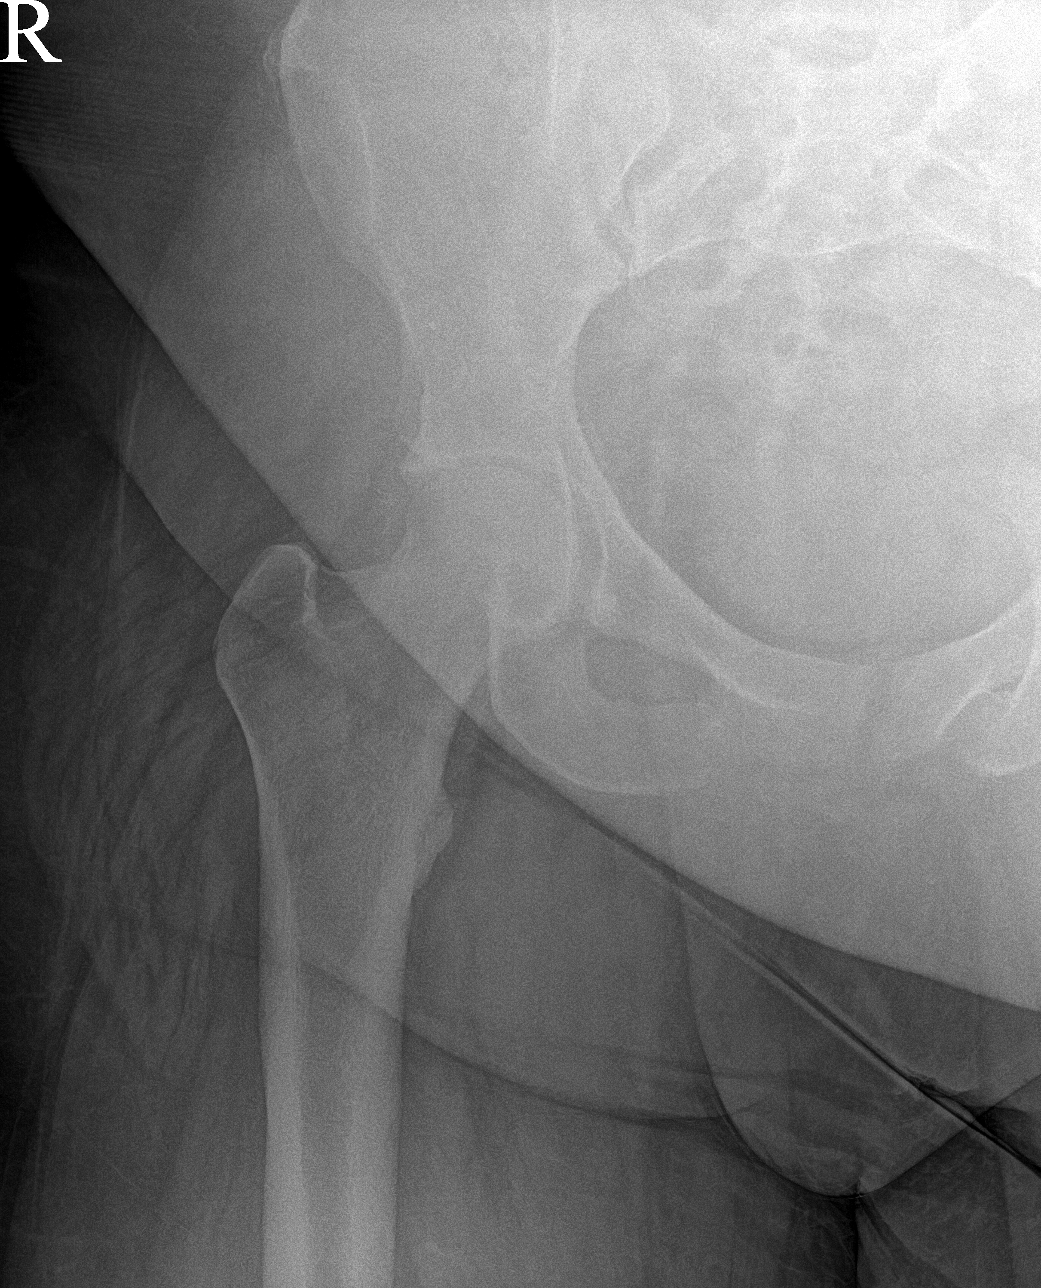

[hip frog leg]
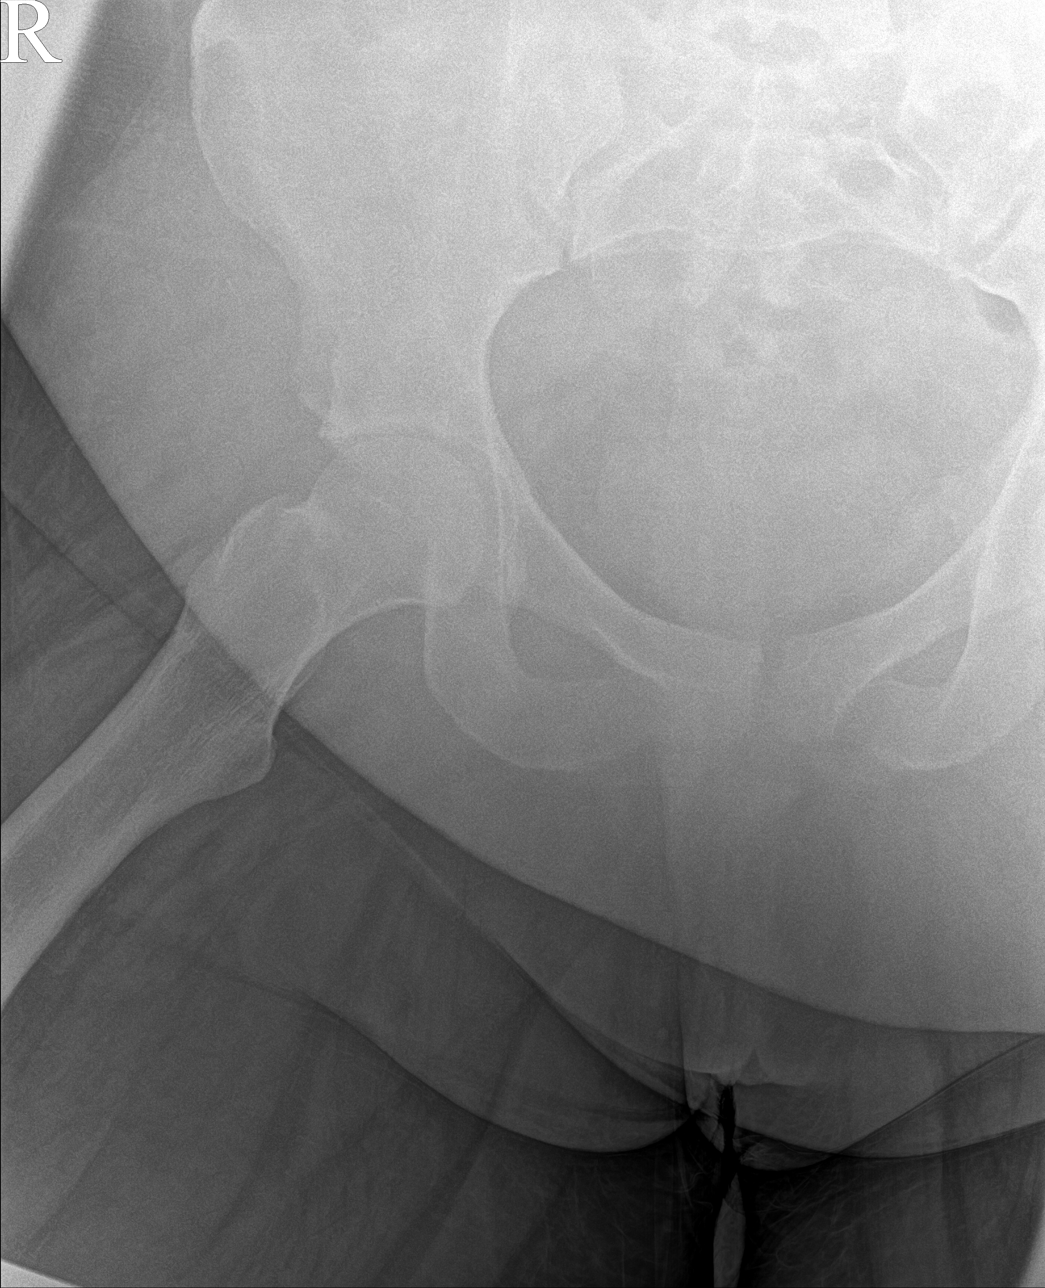

[4 of 4 positions shown; findings below may reference images not displayed]

FINDINGS: There is no evidence of hip fracture or dislocation. Mild narrow
bilateral hip joint spaces are noted.
IMPRESSION: No acute fracture or dislocation.

## 2024-01-27 IMAGING — DX DG KNEE AP/LAT W/ SUNRISE*R*
3 series · 3 of 3 positions shown · non-contrast
Comparison: None Available.

CLINICAL DATA: Right knee pain for 3 weeks.

EXAM:
RIGHT KNEE 3 VIEWS

[knee ap]
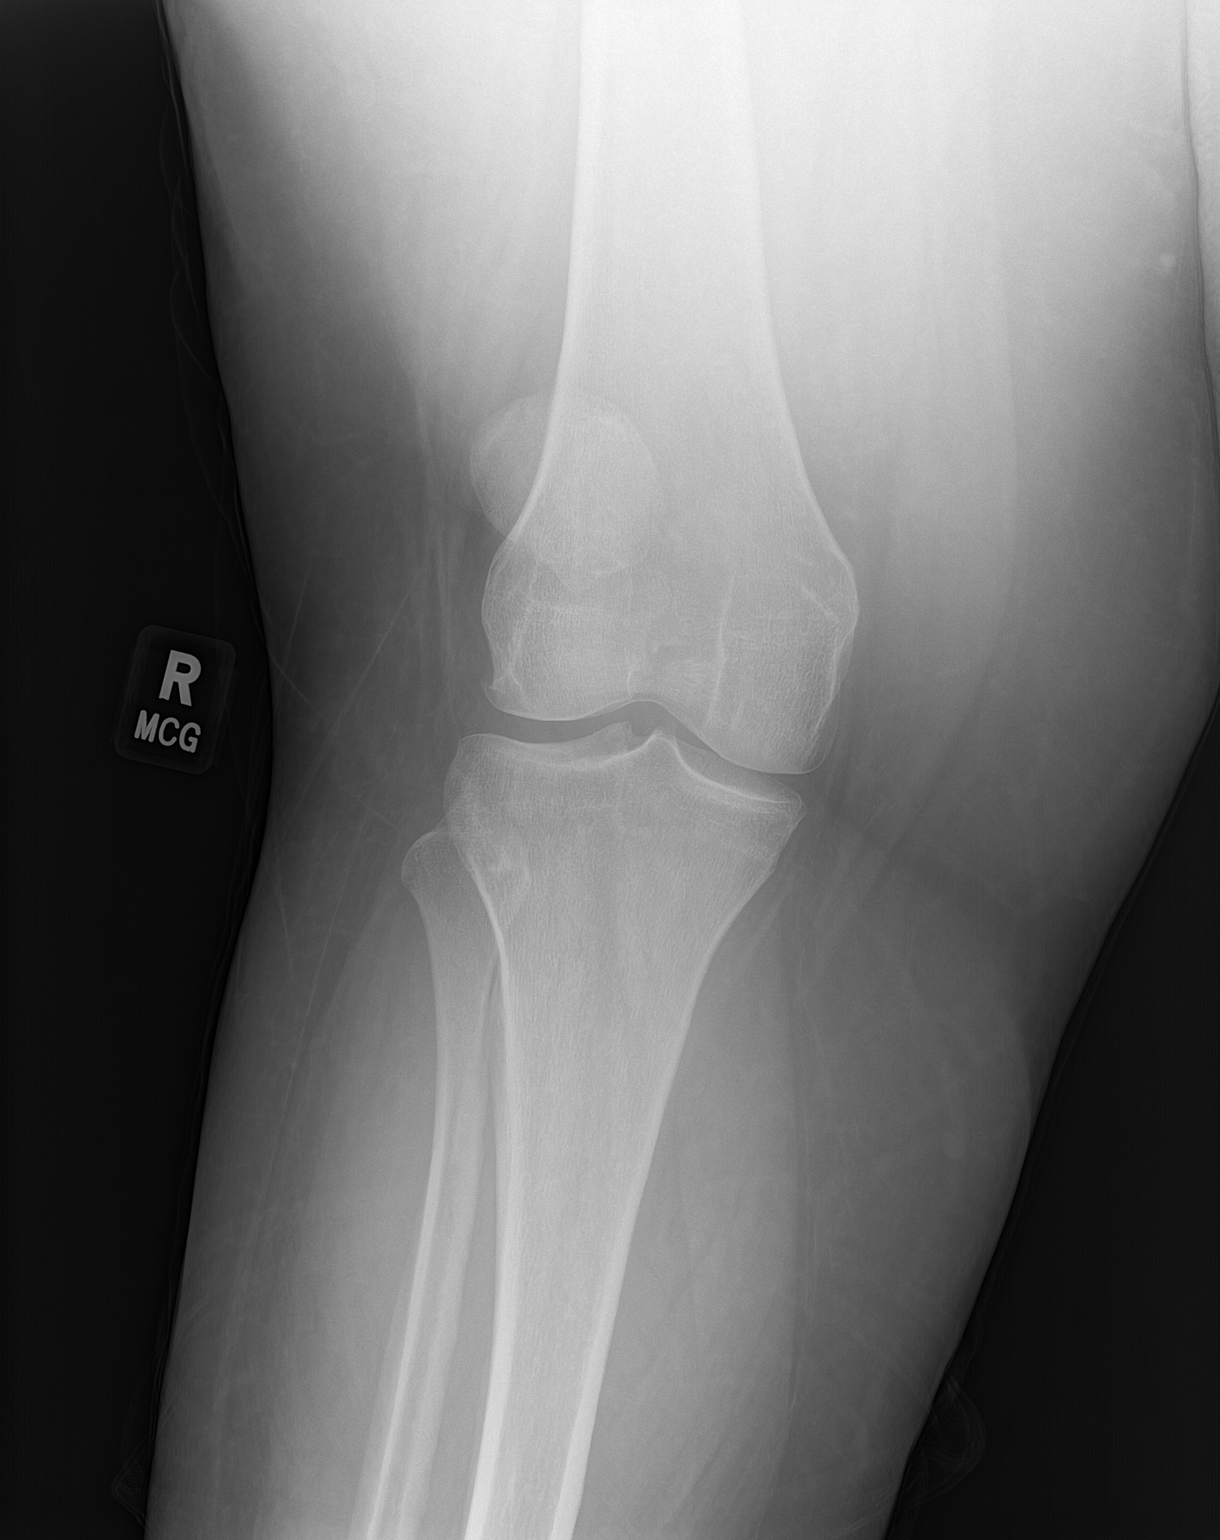

[knee lat]
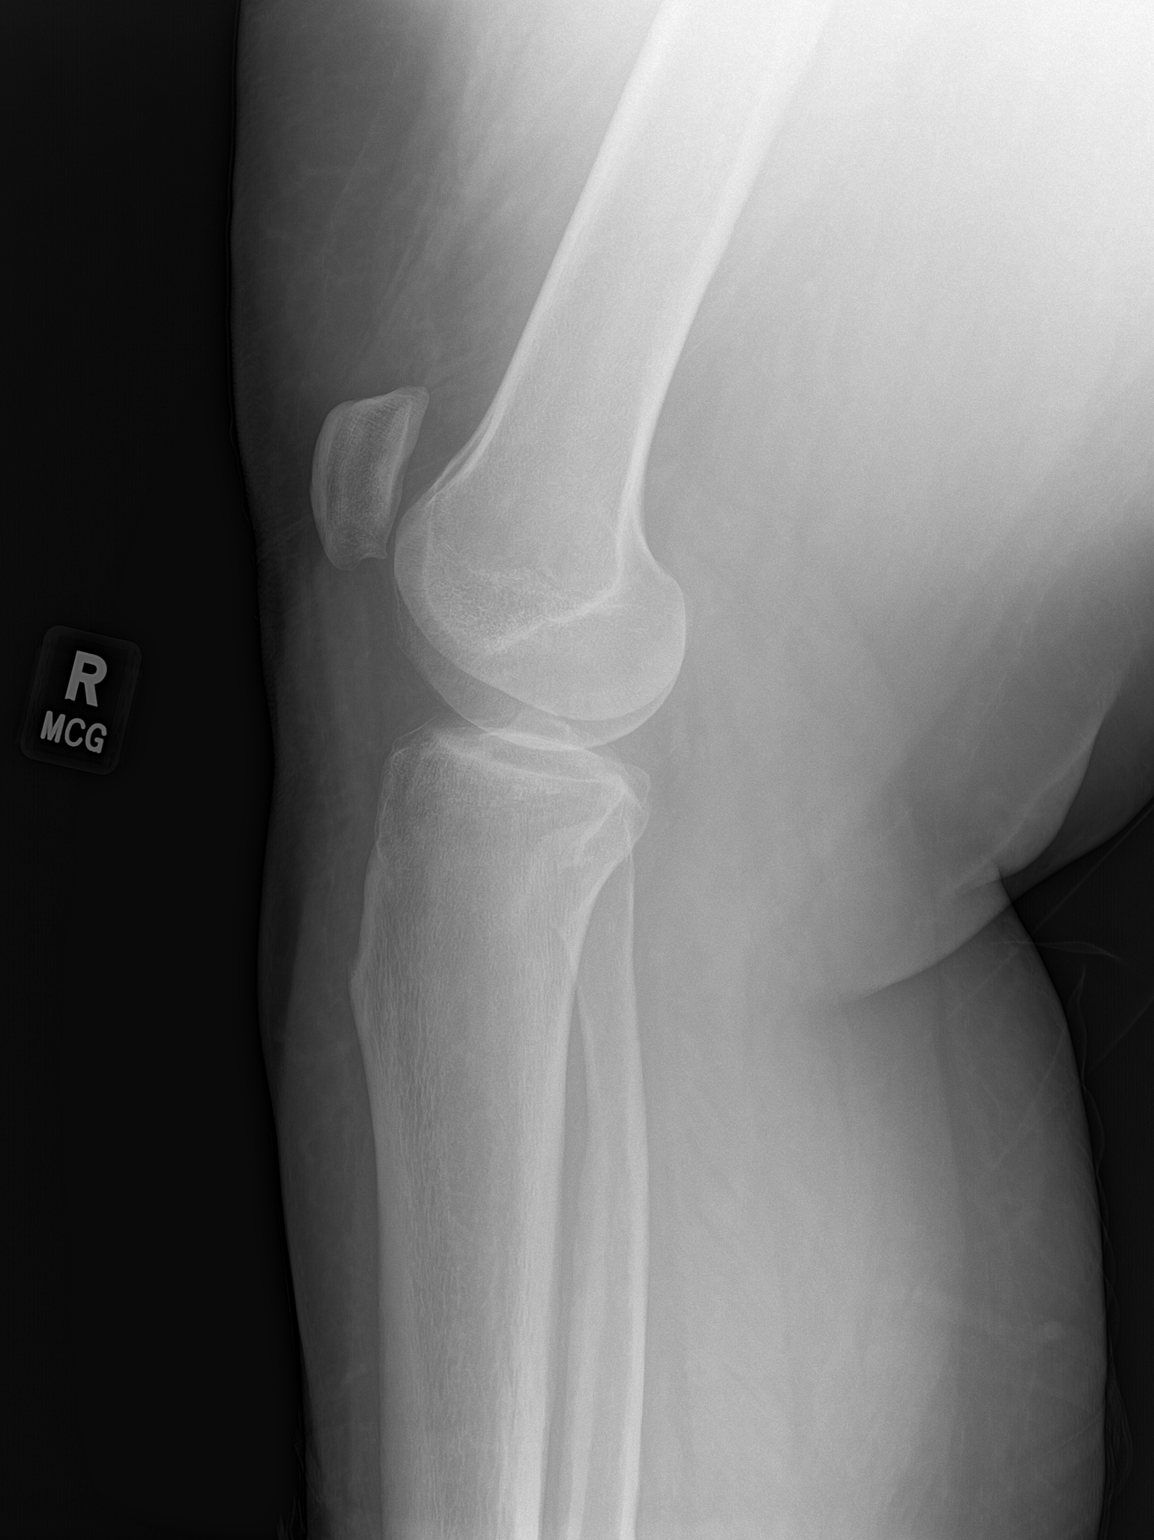

[patella]
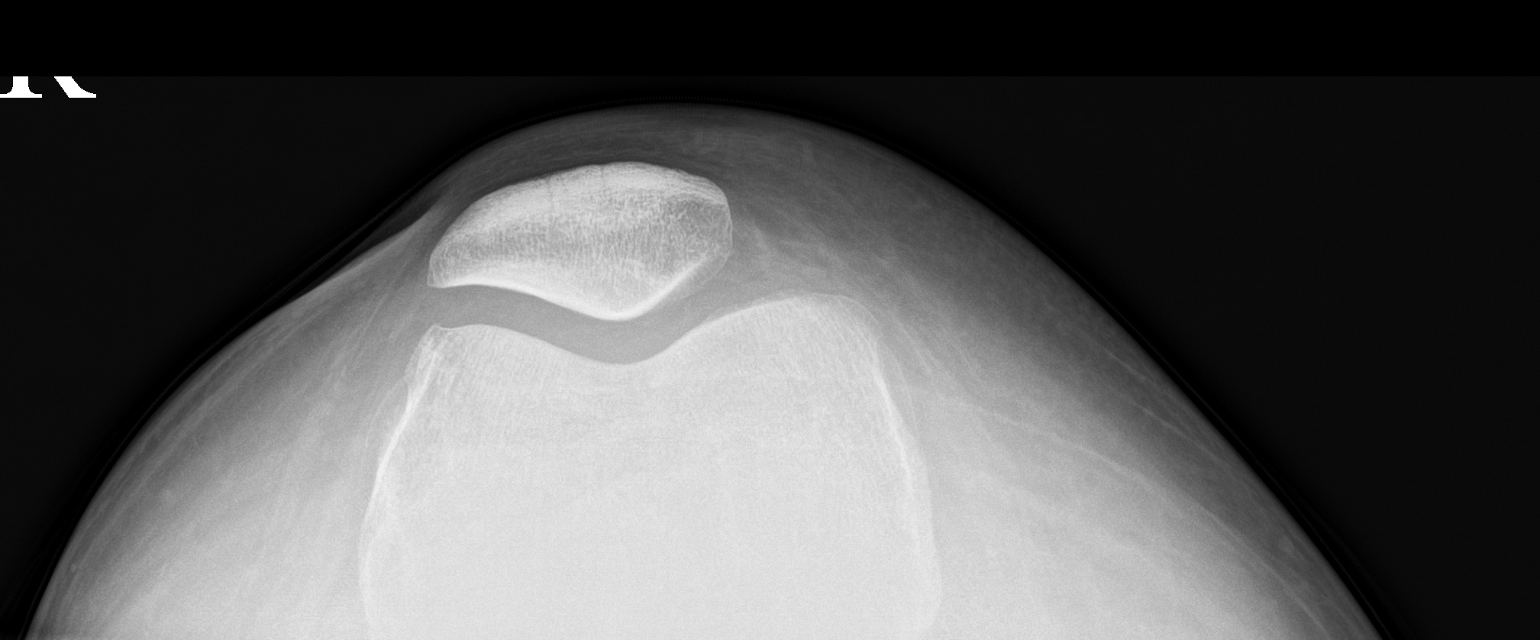

[3 of 3 positions shown; findings below may reference images not displayed]

FINDINGS: No evidence of fracture, dislocation, or joint effusion. No evidence
of arthropathy or other focal bone abnormality. Soft tissues are
unremarkable.
IMPRESSION: Negative.

## 2024-01-27 MED ORDER — FLUCONAZOLE 150 MG PO TABS: 150.0000 mg | ORAL_TABLET | Freq: Every day | ORAL | 1 refills | Status: DC

## 2024-01-27 NOTE — Progress Notes (Signed)
 History:  [name] is a 28 y.o. [id number] who presents to clinic today for BP check. Doing well but reports a yeast infection (itching and thick chunky vaginal discharge) due to the antibiotics she's on for a tooth abscess (this is typical for her).   The following portions of the patient's history were reviewed and updated as appropriate: allergies, current medications, family history, past medical history, social history, past surgical history and problem list.  Review of Systems:  Pertinent items noted in HPI and remainder of comprehensive ROS otherwise negative.   Objective:  Physical Exam BP 124/84   Pulse 80   Wt (!) 391 lb 4.8 oz (177.5 kg)   LMP 01/04/2024 (Exact Date)   Breastfeeding No   BMI 50.24 kg/m  Physical Exam Vitals and nursing note reviewed.  Constitutional:      Appearance: Normal appearance. She is normal weight.  Cardiovascular:     Rate and Rhythm: Normal rate and regular rhythm.  Pulmonary:     Effort: Pulmonary effort is normal.  Musculoskeletal:        General: Normal range of motion.  Skin:    General: Skin is warm and dry.     Capillary Refill: Capillary refill takes less than 2 seconds.  Neurological:     Mental Status: She is alert and oriented to person, place, and time.  Psychiatric:        Mood and Affect: Mood normal.        Behavior: Behavior normal.    Labs and Imaging No results found. However, due to the size of the patient record, not all encounters were searched. Please check Results Review for a complete set of results.  No results found.   Assessment & Plan:  1. Chronic hypertension - BP normal today, on norvasc  2. Yeast vaginitis - fluconazole (DIFLUCAN) 150 MG tablet; Take 1 tablet (150 mg total) by mouth daily. Repeat dose in 3 days if not resolved  Dispense: 1 tablet; Refill: 1  3. History of 2 cesarean sections - Healing well  4. Atypical squamous cells of undetermined significance on cytologic  smear of cervix (ASC-US) - Needs repeat pap in July.  Follow up PRN or for annual in July    Bernerd Limbo, [place] 01/27/2024 4:21 PM

## 2024-01-27 NOTE — Progress Notes (Signed)
 LC to room per patient request. Patient reports baby had his released tongue, reattach and the pediatric dentist encouraged mom to work with lactation to assist with bottle feeding until they can schedule another release procedure. LC reviewed appointment options and current appointment for baby Maureen Lewis booked on Wednesday the 16 th, follow up at that time and encouraged to call with any other questions.  Juventino Slovak Lactation Consultant.

## 2024-01-28 DIAGNOSIS — F53 Postpartum depression: Secondary | ICD-10-CM | POA: Diagnosis not present

## 2024-02-02 NOTE — BH Specialist Note (Signed)
 Integrated Behavioral Health via Telemedicine Visit  02/12/2024 Maureen Lewis [id number]  Number of Integrated Behavioral Health Clinician visits: 2- Second Visit  Session Start time: 0845   Session End time: 0850  Total time in minutes: 5   Referring Provider: Lenoard Rad, MD Maureen/Family location: Home [facility] Provider location: Center for [facility] at [facility]  All persons participating in visit: Maureen Lewis and Maureen Lewis   Types of Service: Individual psychotherapy and Telephone visit  I connected with Maureen Lewis and/or Maureen Lewis's  n/a  via  Telephone or Video Enabled Telemedicine Application  (Video is Caregility application) and verified that I am speaking with the correct person using two identifiers. Discussed confidentiality: Yes   I discussed the limitations of telemedicine and the availability of in person appointments.  Discussed there is a possibility of technology failure and discussed alternative modes of communication if that failure occurs.  I discussed that engaging in this telemedicine visit, they consent to the provision of behavioral healthcare and the services will be billed under their insurance.  Maureen and/or legal guardian expressed understanding and consented to Telemedicine visit: Yes   Presenting Concerns: Maureen and/or family reports the following symptoms/concerns: Acceptance of current situation (housing with black mold; car problems, etc.) while maintaining hope that she will find new housing soon (actively looking); continues to spend time outdoors and removing herself from stressful situations to cope with life stressors; is established now with both psychiatrist and ongoing therapist.  Duration of problem: Postpartum; Severity of problem: moderate  Maureen and/or Family's Strengths/Protective Factors: Social connections, Social and Emotional  competence, and Sense of purpose  Goals Addressed: Maureen will:  Reduce symptoms of: depression and stress    Demonstrate ability to: Increase healthy adjustment to current life circumstances  Progress towards Goals: Ongoing  Interventions: Interventions utilized:  Supportive Reflection Standardized Assessments completed: GAD-7 and PHQ 9  Maureen and/or Family Response: Maureen agrees with treatment plan.   Assessment: Maureen currently experiencing Adjustment disorder with depressed mood; Psychosocial stress.   Maureen may benefit from continued therapeutic intervention  .  Plan: Follow up with behavioral health clinician on : Call Maureen Lewis at [phone], as needed. Behavioral recommendations:  -Continue plan to attend upcoming psychiatry and therapy appointments -Continue working with community agencies as needed to obtain safe housing -Continue maintaining hope and engaging in self-coping strategies (outdoor time; time with supportive people in life) -Continue to consider new mom support group, if needed (www.postpartum.net or www.conehealthybaby.com) Referral(s): Integrated [employer] (In Clinic)  I discussed the assessment and treatment plan with the Maureen and/or parent/guardian. They were provided an opportunity to ask questions and all were answered. They agreed with the plan and demonstrated an understanding of the instructions.   They were advised to call back or seek an in-person evaluation if the symptoms worsen or if the condition fails to improve as anticipated.  Maureen Grippe Athol Bolds, LCSW     01/27/2024   12:00 PM 01/21/2024    8:37 AM 08/14/2023    3:26 PM 05/07/2023   11:17 AM 07/15/2022    4:22 PM  Depression screen PHQ 2/9  Decreased Interest 0 0 0 0 0  Down, Depressed, Hopeless 0 1 0 0 1  PHQ - 2 Score 0 1 0 0 1  Altered sleeping 0 0  0 1  Tired, decreased energy 0 3  3 1   Change in appetite 0 3  3 0  Feeling bad or failure about yourself   0 0  0 0  Trouble concentrating 0 0  0 0  Moving slowly or fidgety/restless 0 0  0 0  Suicidal thoughts 0 0  0 0  PHQ-9 Score 0 7  6 3   Difficult doing work/chores     Somewhat difficult      01/27/2024   12:00 PM 01/21/2024    8:41 AM 05/07/2023   11:17 AM  GAD 7 : Generalized Anxiety Score  Nervous, Anxious, on Edge 0 0 0  Control/stop worrying 0 0 0  Worry too much - different things 0 1 0  Trouble relaxing 0 0 0  Restless 0 0 0  Easily annoyed or irritable 0 3 0  Afraid - awful might happen 0 0 0  Total GAD 7 Score 0 4 0

## 2024-02-04 DIAGNOSIS — F53 Postpartum depression: Secondary | ICD-10-CM | POA: Diagnosis not present

## 2024-02-04 DIAGNOSIS — Z5989 Other problems related to housing and economic circumstances: Secondary | ICD-10-CM | POA: Diagnosis not present

## 2024-02-08 ENCOUNTER — Ambulatory Visit (HOSPITAL_COMMUNITY): Attending: Internal Medicine

## 2024-02-08 ENCOUNTER — Encounter: Payer: Self-pay | Admitting: Cardiology

## 2024-02-08 ENCOUNTER — Telehealth: Payer: Self-pay | Admitting: Family Medicine

## 2024-02-08 DIAGNOSIS — R0602 Shortness of breath: Secondary | ICD-10-CM | POA: Diagnosis not present

## 2024-02-08 LAB — ECHOCARDIOGRAM COMPLETE
Area-P 1/2: 3.88 cm2
S' Lateral: 3.7 cm

## 2024-02-08 NOTE — Telephone Encounter (Signed)
 Patient is having abdominal, back and vaginal pain.. she is PP and would like a nurse to call back.

## 2024-02-09 NOTE — Telephone Encounter (Signed)
 Called pt and pt informed me that she thinks she still has the yeast infection.  I asked pt if she took one or two tablets.  Pt confirmed one.   I advised pt that she can call her pharmacy for another refill on her Diflucan  tablet and that should help.  I encouraged proper body mechanics, heating pad, and massage to help with lower back pain.  Pt verbalized understanding.   Meshelle Holness,RN

## 2024-02-11 ENCOUNTER — Encounter: Payer: Self-pay | Admitting: Certified Nurse Midwife

## 2024-02-12 ENCOUNTER — Ambulatory Visit: Payer: Self-pay | Admitting: Clinical

## 2024-02-12 DIAGNOSIS — F53 Postpartum depression: Secondary | ICD-10-CM | POA: Diagnosis not present

## 2024-02-12 DIAGNOSIS — F4321 Adjustment disorder with depressed mood: Secondary | ICD-10-CM

## 2024-02-12 DIAGNOSIS — Z658 Other specified problems related to psychosocial circumstances: Secondary | ICD-10-CM

## 2024-02-12 DIAGNOSIS — Z5989 Other problems related to housing and economic circumstances: Secondary | ICD-10-CM | POA: Diagnosis not present

## 2024-02-17 ENCOUNTER — Other Ambulatory Visit: Payer: Self-pay

## 2024-02-17 ENCOUNTER — Ambulatory Visit: Payer: Self-pay | Admitting: Certified Nurse Midwife

## 2024-02-17 ENCOUNTER — Encounter: Payer: Self-pay | Admitting: Certified Nurse Midwife

## 2024-02-17 VITALS — BP 138/90 | HR 84 | Wt 384.0 lb

## 2024-02-17 DIAGNOSIS — R1084 Generalized abdominal pain: Secondary | ICD-10-CM

## 2024-02-17 NOTE — Progress Notes (Signed)
 History:  [name] is a 28 y.o. [phone] who presents to clinic today for generalized abdominal pain - hurts in her upper and lower quadrants (on the lateral edges) but not in the middle. Does not associate the pain with anything, is not aggravated by movement/eating/coughing, etc. Heat helps relieve it. Has a history of a gastric sleeve and does not eat sugar or large amounts.  The following portions of the patient's history were reviewed and updated as appropriate: allergies, current medications, family history, past medical history, social history, past surgical history and problem list.  Review of Systems:  Review of Systems  Constitutional:  Negative for chills, fever and malaise/fatigue.  Respiratory:  Negative for cough.   Cardiovascular:  Negative for chest pain.  Gastrointestinal:  Positive for abdominal pain. Negative for constipation, diarrhea, heartburn, nausea and vomiting.  Genitourinary:  Negative for dysuria.  All other systems reviewed and are negative.     Objective:  Physical Exam BP (!) 138/90   Pulse 84   Wt (!) 384 lb (174.2 kg)   LMP 01/04/2024 (Exact Date)   Breastfeeding No   BMI 49.30 kg/m  Physical Exam Vitals and nursing note reviewed.  Constitutional:      Appearance: Normal appearance. She is obese.  Eyes:     Pupils: Pupils are equal, round, and reactive to light.  Cardiovascular:     Rate and Rhythm: Normal rate and regular rhythm.  Abdominal:     General: Bowel sounds are normal.     Palpations: Abdomen is soft.     Tenderness: There is abdominal tenderness in the right upper quadrant, right lower quadrant, left upper quadrant and left lower quadrant. There is no guarding or rebound.  Musculoskeletal:        General: Normal range of motion.  Skin:    General: Skin is warm and dry.     Findings: No erythema.  Neurological:     Mental Status: She is alert and oriented to person, place, and time.  Psychiatric:        Mood  and Affect: Mood normal.        Behavior: Behavior normal.    Labs and Imaging No results found. However, due to the size of the patient record, not all encounters were searched. Please check Results Review for a complete set of results.  No results found.   Assessment & Plan:  1. Generalized abdominal pain (Primary) - Does not seem gynecologic in nature, advised to make an appointment with her GI doctor for evaluation  Follow up PRN.   Derick Fleeting, [place] 02/17/2024 5:28 PM

## 2024-02-18 DIAGNOSIS — E66813 Obesity, class 3: Secondary | ICD-10-CM | POA: Diagnosis not present

## 2024-02-18 DIAGNOSIS — F53 Postpartum depression: Secondary | ICD-10-CM | POA: Diagnosis not present

## 2024-02-18 DIAGNOSIS — Z6841 Body Mass Index (BMI) 40.0 and over, adult: Secondary | ICD-10-CM | POA: Diagnosis not present

## 2024-02-18 DIAGNOSIS — Z5986 Financial insecurity: Secondary | ICD-10-CM | POA: Diagnosis not present

## 2024-02-22 DIAGNOSIS — N914 Secondary oligomenorrhea: Secondary | ICD-10-CM | POA: Diagnosis not present

## 2024-02-22 DIAGNOSIS — R7303 Prediabetes: Secondary | ICD-10-CM | POA: Diagnosis not present

## 2024-02-22 DIAGNOSIS — Z79899 Other long term (current) drug therapy: Secondary | ICD-10-CM | POA: Diagnosis not present

## 2024-02-22 DIAGNOSIS — G8929 Other chronic pain: Secondary | ICD-10-CM | POA: Diagnosis not present

## 2024-02-22 DIAGNOSIS — Z6841 Body Mass Index (BMI) 40.0 and over, adult: Secondary | ICD-10-CM | POA: Diagnosis not present

## 2024-02-22 DIAGNOSIS — M25571 Pain in right ankle and joints of right foot: Secondary | ICD-10-CM | POA: Diagnosis not present

## 2024-02-22 DIAGNOSIS — M51362 Other intervertebral disc degeneration, lumbar region with discogenic back pain and lower extremity pain: Secondary | ICD-10-CM | POA: Diagnosis not present

## 2024-02-22 DIAGNOSIS — E559 Vitamin D deficiency, unspecified: Secondary | ICD-10-CM | POA: Diagnosis not present

## 2024-02-24 DIAGNOSIS — F53 Postpartum depression: Secondary | ICD-10-CM | POA: Diagnosis not present

## 2024-02-25 DIAGNOSIS — Z79899 Other long term (current) drug therapy: Secondary | ICD-10-CM | POA: Diagnosis not present

## 2024-02-29 ENCOUNTER — Encounter: Payer: Self-pay | Admitting: Family Medicine

## 2024-03-01 ENCOUNTER — Encounter: Payer: Self-pay | Admitting: Certified Nurse Midwife

## 2024-03-01 ENCOUNTER — Other Ambulatory Visit (HOSPITAL_COMMUNITY)
Admission: RE | Admit: 2024-03-01 | Discharge: 2024-03-01 | Disposition: A | Discharge status: Home / Self Care | Source: Ambulatory Visit | Attending: Certified Nurse Midwife | Admitting: Certified Nurse Midwife

## 2024-03-01 ENCOUNTER — Other Ambulatory Visit: Payer: Self-pay

## 2024-03-01 ENCOUNTER — Ambulatory Visit (INDEPENDENT_AMBULATORY_CARE_PROVIDER_SITE_OTHER): Admitting: Certified Nurse Midwife

## 2024-03-01 VITALS — BP 118/74 | HR 81 | Wt 384.8 lb

## 2024-03-01 DIAGNOSIS — N898 Other specified noninflammatory disorders of vagina: Secondary | ICD-10-CM

## 2024-03-01 DIAGNOSIS — N920 Excessive and frequent menstruation with regular cycle: Secondary | ICD-10-CM | POA: Diagnosis not present

## 2024-03-01 MED ORDER — TRANEXAMIC ACID 650 MG PO TABS: 1300.0000 mg | ORAL_TABLET | Freq: Three times a day (TID) | ORAL | 0 refills | Status: DC

## 2024-03-01 NOTE — Progress Notes (Addendum)
 History:  [name] is a 28 y.o. [phone] who presents to clinic today for itchy vaginal discharge that she thinks may be yeast or BV. She also has concerns with heavy menstrual bleeding. She reports that she is using adult diapers with overnight pads attached and has to change them about every 30 minutes. She is soaking through them at night leaking onto her bed. Her periods are lasting about 5 days and the amount of bleeding is the same each day. She says that the HMB has been going on for several years. She denies any other symptoms or concerns.  The following portions of the patient's history were reviewed and updated as appropriate: allergies, current medications, family history, past medical history, social history, past surgical history and problem list.  Review of Systems:   Pertinent items noted in HPI and remainder of comprehensive ROS otherwise negative.    Objective:  Physical Exam BP 118/74   Pulse 81   Wt (!) 174.5 kg   LMP 02/11/2024 (Exact Date)   Breastfeeding No   BMI 49.41 kg/m  Physical Exam Constitutional:      Appearance: Normal appearance.  HENT:     Head: Normocephalic.  Cardiovascular:     Rate and Rhythm: Normal rate.  Pulmonary:     Effort: Pulmonary effort is normal.  Neurological:     Mental Status: She is alert and oriented to person, place, and time. Mental status is at baseline.  Psychiatric:        Mood and Affect: Mood normal.        Behavior: Behavior normal.     Assessment & Plan:  1. Menorrhagia with regular cycle (Primary) - tranexamic acid  (LYSTEDA ) 650 MG TABS tablet; Take 2 tablets (1,300 mg total) by mouth 3 (three) times daily.  Dispense: 30 tablet; Refill: 0 - If bleeding continues to be heavy will refer to physician for further evaluation.  2. Vaginal discharge - Cervicovaginal ancillary [facility])    Maureen Lewis 03/01/2024 1:31 PM

## 2024-03-02 LAB — CERVICOVAGINAL ANCILLARY ONLY
Bacterial Vaginitis (gardnerella): NEGATIVE
Candida Glabrata: NEGATIVE
Candida Vaginitis: POSITIVE — AB
Chlamydia: NEGATIVE
Comment: NEGATIVE
Comment: NEGATIVE
Comment: NEGATIVE
Comment: NEGATIVE
Comment: NEGATIVE
Comment: NORMAL
Neisseria Gonorrhea: NEGATIVE
Trichomonas: NEGATIVE

## 2024-03-03 DIAGNOSIS — F53 Postpartum depression: Secondary | ICD-10-CM | POA: Diagnosis not present

## 2024-03-03 DIAGNOSIS — Z600 Problems of adjustment to life-cycle transitions: Secondary | ICD-10-CM | POA: Diagnosis not present

## 2024-03-07 ENCOUNTER — Ambulatory Visit: Payer: Self-pay | Admitting: Certified Nurse Midwife

## 2024-03-07 DIAGNOSIS — B3731 Acute candidiasis of vulva and vagina: Secondary | ICD-10-CM

## 2024-03-07 MED ORDER — FLUCONAZOLE 150 MG PO TABS: 150.0000 mg | ORAL_TABLET | ORAL | 0 refills | Status: AC

## 2024-03-10 DIAGNOSIS — F53 Postpartum depression: Secondary | ICD-10-CM | POA: Diagnosis not present

## 2024-03-10 DIAGNOSIS — Z564 Discord with boss and workmates: Secondary | ICD-10-CM | POA: Diagnosis not present

## 2024-03-18 DIAGNOSIS — F4323 Adjustment disorder with mixed anxiety and depressed mood: Secondary | ICD-10-CM | POA: Diagnosis not present

## 2024-03-18 DIAGNOSIS — Z6379 Other stressful life events affecting family and household: Secondary | ICD-10-CM | POA: Diagnosis not present

## 2024-03-21 DIAGNOSIS — Z79899 Other long term (current) drug therapy: Secondary | ICD-10-CM | POA: Diagnosis not present

## 2024-03-21 DIAGNOSIS — M51362 Other intervertebral disc degeneration, lumbar region with discogenic back pain and lower extremity pain: Secondary | ICD-10-CM | POA: Diagnosis not present

## 2024-03-21 DIAGNOSIS — Z6841 Body Mass Index (BMI) 40.0 and over, adult: Secondary | ICD-10-CM | POA: Diagnosis not present

## 2024-03-21 DIAGNOSIS — N914 Secondary oligomenorrhea: Secondary | ICD-10-CM | POA: Diagnosis not present

## 2024-03-21 DIAGNOSIS — G8929 Other chronic pain: Secondary | ICD-10-CM | POA: Diagnosis not present

## 2024-03-21 DIAGNOSIS — E559 Vitamin D deficiency, unspecified: Secondary | ICD-10-CM | POA: Diagnosis not present

## 2024-03-21 DIAGNOSIS — M25571 Pain in right ankle and joints of right foot: Secondary | ICD-10-CM | POA: Diagnosis not present

## 2024-03-21 DIAGNOSIS — R7303 Prediabetes: Secondary | ICD-10-CM | POA: Diagnosis not present

## 2024-03-22 DIAGNOSIS — F4321 Adjustment disorder with depressed mood: Secondary | ICD-10-CM | POA: Diagnosis not present

## 2024-03-22 DIAGNOSIS — Z630 Problems in relationship with spouse or partner: Secondary | ICD-10-CM | POA: Diagnosis not present

## 2024-03-24 DIAGNOSIS — Z79899 Other long term (current) drug therapy: Secondary | ICD-10-CM | POA: Diagnosis not present

## 2024-03-29 DIAGNOSIS — F4323 Adjustment disorder with mixed anxiety and depressed mood: Secondary | ICD-10-CM | POA: Diagnosis not present

## 2024-04-05 ENCOUNTER — Ambulatory Visit: Admitting: Cardiology

## 2024-04-05 DIAGNOSIS — F4323 Adjustment disorder with mixed anxiety and depressed mood: Secondary | ICD-10-CM | POA: Diagnosis not present

## 2024-04-05 DIAGNOSIS — Z6379 Other stressful life events affecting family and household: Secondary | ICD-10-CM | POA: Diagnosis not present

## 2024-04-12 ENCOUNTER — Ambulatory Visit (INDEPENDENT_AMBULATORY_CARE_PROVIDER_SITE_OTHER): Admitting: Adult Health

## 2024-04-12 ENCOUNTER — Ambulatory Visit: Payer: Self-pay

## 2024-04-12 ENCOUNTER — Ambulatory Visit

## 2024-04-12 ENCOUNTER — Encounter: Payer: Self-pay | Admitting: Adult Health

## 2024-04-12 VITALS — BP 130/100 | HR 83 | Temp 98.6°F | Ht 74.0 in | Wt 390.0 lb

## 2024-04-12 DIAGNOSIS — I10 Essential (primary) hypertension: Secondary | ICD-10-CM | POA: Diagnosis not present

## 2024-04-12 MED ORDER — HYDROCHLOROTHIAZIDE 25 MG PO TABS
25.0000 mg | ORAL_TABLET | Freq: Every day | ORAL | 3 refills | Status: DC
Start: 2024-04-12 — End: 2024-07-26

## 2024-04-12 NOTE — Telephone Encounter (Signed)
 Noted

## 2024-04-12 NOTE — Telephone Encounter (Signed)
 FYI Only or Action Required?: Action required by provider: update on patient condition.  Patient was last seen in primary care on 12/14/2023 by Loyola Fish, MD. Called Nurse Triage reporting Hypertension. Symptoms began several months ago. Interventions attempted: Prescription medications: amlodipine  and hctz. Symptoms are: headaches, blurry vision with floaters in eyes, hypertension gradually worsening.  Triage Disposition: Go to ED Now (Notify PCP)- Patient refused and states she has an appt this afternoon with PCP.  Patient/caregiver understands and will follow disposition?: No, refuses disposition                      Reason for Disposition  [1] Systolic BP  >= 160 OR Diastolic >= 100 AND [2] cardiac (e.g., breathing difficulty, chest pain) or neurologic symptoms (e.g., new-onset blurred or double vision, unsteady gait)  Answer Assessment - Initial Assessment Questions Patient states her BP has been running high since giving birth in February. Called [facility] and notified Madeline of ED refusal and of symptoms and that patient was advised to recheck her BP and call back with the reading.  1. BLOOD PRESSURE: What is the blood pressure? Did you take at least two measurements 5 minutes apart?     148/105. (140/90 yesterday, 145/95 last night)  2. ONSET: When did you take your blood pressure?     This morning around 0730.  3. HOW: How did you take your blood pressure? (e.g., automatic home BP monitor, visiting nurse)     Automatic home BP monitor.  4. HISTORY: Do you have a history of high blood pressure?     Yes.  5. MEDICINES: Are you taking any medicines for blood pressure? Have you missed any doses recently?     Yes. Amlodipine  and hydrochlorothiazide . She states she takes them daily, no missed doses.  6. OTHER SYMPTOMS: Do you have any symptoms? (e.g., blurred vision, chest pain, difficulty breathing, headache, weakness)     Headaches 6/10 (x 6  days),bilateral hands cramping and numbness, blurry vision with floaters (x1-1.5 weeks). Patient denies chest pain, difficulty breathing, unilateral numbness or weakness.  7. PREGNANCY: Is there any chance you are pregnant? When was your last menstrual period?     No, LMP: yesterday.  Protocols used: Blood Pressure - High-A-AH

## 2024-04-12 NOTE — Progress Notes (Signed)
 Subjective:    Patient ID: Maureen Lewis, female    DOB: November 23, 1995, 28 y.o.   MRN: [id number]  HPI 28 year old female who  has a past medical history of Abnormal uterine bleeding, Acid reflux, Alpha thalassemia trait, Amenorrhea, Anemia, Asthma, Back pain, Chest pain, Complication of anesthesia, Constipation, Depression, Fibromyalgia, Finger mass, left (03/2017), Gestational hypertension (09/23/2019), Headache, Hepatitis (07/18/2022), History of kidney stones, Hypertension, Irritable bowel syndrome (IBS), Joint pain, Morbid obesity with body mass index (BMI) of 45.0 to 49.9 in adult [facility]), Plantar fasciitis, bilateral, Polycystic ovary syndrome, Pre-diabetes, Prediabetes (09/16/2017), Shortness of breath, Sleep apnea, and Vertigo (2017).  She presents to the office today for concern of elevated blood pressure readings. She reports that as of recently she has bene getting readings into the 140's/high 90-110. She will get a headache when he blood pressure is elevated. She is currently managed with Norvasc  10 mg and hydrochlorothiazide  12.5 mg daily- this is prescribed by cardiology   BP Readings from Last 3 Encounters:  04/12/24 (!) 130/100  03/01/24 118/74  02/17/24 (!) 138/90    Review of Systems  See HPI   Past Medical History:  Diagnosis Date   Abnormal uterine bleeding    Acid reflux    Alpha thalassemia trait    Amenorrhea    Anemia    no current med.   Asthma    Back pain    Chest pain    resolved   Complication of anesthesia    states had to keep giving her anesthesia during EGD   Constipation    Depression    I'm good   Fibromyalgia    Finger mass, left 03/2017   middle finger   Gestational hypertension 09/23/2019   Guidelines for Antenatal Testing and Sonography  (with updated ICD-10 codes)  Updated  Sep 23, 2019 with Dr. Fredia Fresh  INDICATION U/S 2 X week NST/AFI  or full BPP wkly DELIVERY Diabetes   A1 - good control - O24.410    A2 - good control -  O24.419      A2  - poor control or poor compliance - O24.419, E11.65   (Macrosomia or polyhydramnios) **E11.65 is extra code for poor control**    A2/B - O24.   Headache    Hepatitis 07/18/2022   Hepatitis B   History of kidney stones    Hypertension    Irritable bowel syndrome (IBS)    Joint pain    Morbid obesity with body mass index (BMI) of 45.0 to 49.9 in adult [facility])    Plantar fasciitis, bilateral    resolved   Polycystic ovary syndrome    Pre-diabetes    no meds, diet controlled, diet not check blood sugar   Prediabetes 09/16/2017   Early 1hr 70     Shortness of breath    albuterol  inhaler   Sleep apnea    no CPAP use   Vertigo 2017    Social History   Socioeconomic History   Marital status: Single    Spouse name: Not on file   Number of children: Not on file   Years of education: Not on file   Highest education level: Not on file  Occupational History   Occupation: IT sales professional    Employer: [id number]  Tobacco Use   Smoking status: Never   Smokeless tobacco: Never  Vaping Use   Vaping status: Never Used  Substance and Sexual Activity   Alcohol use: Not Currently    Comment: social  Drug use: No   Sexual activity: Not Currently    Birth control/protection: None  Other Topics Concern   Not on file  Social History Narrative   Right handed    Soda sometimes   Lives with mom and cousin, a grandmother in [place] also helps care for her.       She is in nursing school.    Social Drivers of Corporate investment banker Strain: High Risk (11/05/2022)   Received from [facility]   Overall Financial Resource Strain (CARDIA)    Difficulty of Paying Living Expenses: Hard  Food Insecurity: No Food Insecurity (01/27/2024)   Hunger Vital Sign    Worried About Running Out of Food in the Last Year: Never true    Ran Out of Food in the Last Year: Never true  Recent Concern: Food Insecurity - Food Insecurity Present (12/04/2023)   Hunger Vital Sign    Worried  About Running Out of Food in the Last Year: Often true    Ran Out of Food in the Last Year: Often true  Transportation Needs: No Transportation Needs (01/27/2024)   PRAPARE - Administrator, Civil Service (Medical): No    Lack of Transportation (Non-Medical): No  Physical Activity: Unknown (11/05/2022)   Received from [facility]   Exercise Vital Sign    On average, how many days per week do you engage in moderate to strenuous exercise (like a brisk walk)?: 0 days    Minutes of Exercise per Session: Not on file  Recent Concern: Physical Activity - Inactive (11/05/2022)   Received from [facility]   Exercise Vital Sign    Days of Exercise per Week: 0 days    Minutes of Exercise per Session: 40 min  Stress: Stress Concern Present (11/05/2022)   Received from [facility] - Occupational Stress Questionnaire    Feeling of Stress : Rather much  Social Connections: Somewhat Isolated (11/05/2022)   Received from [facility]   Social Network    How would you rate your social network (family, work, friends)?: Restricted participation with some degree of social isolation  Intimate Partner Violence: Not At Risk (12/04/2023)   Humiliation, Afraid, Rape, and Kick questionnaire    Fear of Current or Ex-Partner: No    Emotionally Abused: No    Physically Abused: No    Sexually Abused: No    Past Surgical History:  Procedure Laterality Date   CESAREAN SECTION N/A 10/09/2019   Procedure: CESAREAN SECTION;  Surgeon: Izell Harari, MD;  Location: [facility];  Service: Obstetrics;  Laterality: N/A;   CESAREAN SECTION WITH BILATERAL TUBAL LIGATION N/A 12/01/2023   Procedure: CESAREAN SECTION WITH BILATERAL TUBAL LIGATION;  Surgeon: Kandis Devaughn Sayres, MD;  Location: [facility];  Service: Obstetrics;  Laterality: N/A;   COLONOSCOPY WITH PROPOFOL   10/07/2016   DILATION AND CURETTAGE OF UTERUS     ESOPHAGOGASTRODUODENOSCOPY  12/21/2015   EXCISION  MASS UPPER EXTREMETIES Left 04/21/2017   Procedure: EXCISION MASS LEFT MIDDLE FINGER;  Surgeon: Murrell Kuba, MD;  Location: [facility];  Service: Orthopedics;  Laterality: Left;   gastric sleeve  05/09/2022   IR FL GUIDED LOC OF NEEDLE/CATH TIP FOR SPINAL INJECTION RT  03/13/2020   KNEE ARTHROSCOPY WITH LATERAL MENISECTOMY Left 10/31/2022   Procedure: KNEE ARTHROSCOPY WITH PARTIAL LATERAL MENISECTOMY;  Surgeon: Sharl Selinda Dover, MD;  Location: [facility];  Service: Orthopedics;  Laterality: Left;  77  KNEE ARTHROSCOPY WITH LATERAL RELEASE Left 10/31/2022   Procedure: KNEE ARTHROSCOPY WITH LATERAL RELEASE AND LIMITED SYNOVECTOMY;  Surgeon: Sharl Selinda Dover, MD;  Location: [facility];  Service: Orthopedics;  Laterality: Left;  77   PHOTOCOAGULATION WITH LASER Left 12/13/2020   Procedure: RETINOPLEXY LEFT EYE;  Surgeon: Jarold Selinda, MD;  Location: [facility] OR;  Service: Ophthalmology;  Laterality: Left;   SCLERAL BUCKLE Right 12/13/2020   Procedure: SCLERAL BUCKLE WITH CRYO;  Surgeon: Jarold Selinda, MD;  Location: [facility] OR;  Service: Ophthalmology;  Laterality: Right;    Family History  Problem Relation Age of Onset   Diabetes Maternal Aunt    Hypertension Maternal Uncle    Depression Maternal Grandfather    Diabetes Maternal Grandfather    Hypertension Maternal Grandfather    Arthritis Mother    High blood pressure Mother    Depression Mother    Sleep apnea Mother    Obesity Mother    Diabetes Mother    Hypertension Mother    Kidney failure Mother    Sleep apnea Father    Obesity Father    Healthy Daughter     Allergies  Allergen Reactions   Bee Venom Hives, Shortness Of Breath and Swelling   Hydrocodone -Acetaminophen  Anaphylaxis    No problem when she takes Tylenol    Peach Flavoring Agent (Non-Screening) Hives, Shortness Of Breath and Other (See Comments)    SWELLING OF MOUTH   Pollen Extract Hives, Shortness Of Breath and Swelling   Remdesivir  Swelling     Angioedema 8/25   Latex Rash   Cortisone Itching and Swelling   Cortizone-10 [Hydrocortisone ]     Swelling and itching   Toradol  [Ketorolac  Tromethamine ] Swelling    Current Outpatient Medications on File Prior to Visit  Medication Sig Dispense Refill   amLODipine  (NORVASC ) 10 MG tablet Take 1 tablet (10 mg total) by mouth daily. 90 tablet 3   hydrochlorothiazide  (MICROZIDE ) 12.5 MG capsule Take 1 capsule (12.5 mg total) by mouth daily. 90 capsule 3   oxyCODONE -acetaminophen  (PERCOCET/ROXICET) 5-325 MG tablet Take 1 tablet by mouth every 6 (six) hours as needed.     pregabalin (LYRICA) 100 MG capsule Take 100 mg by mouth at bedtime.     Prenatal Vit-Fe Phos-FA-Omega (VITAFOL  GUMMIES) 3.33-0.333-34.8 MG CHEW Chew 1 tablet by mouth daily. 90 tablet 3   tiZANidine  (ZANAFLEX ) 4 MG tablet Take 4 mg by mouth 3 (three) times daily as needed.     tranexamic acid  (LYSTEDA ) 650 MG TABS tablet Take 2 tablets (1,300 mg total) by mouth 3 (three) times daily. 30 tablet 0   Vitamin D , Ergocalciferol , (DRISDOL ) 1.25 MG (50000 UNIT) CAPS capsule TAKE 1 CAPSULE BY MOUTH EVERY 7 DAYS (Patient not taking: Reported on 03/01/2024) 10 capsule 0   WEGOVY  1 MG/0.5ML SOAJ Inject 1 mg into the skin once a week.     No current facility-administered medications on file prior to visit.    BP (!) 130/100   Pulse 83   Temp 98.6 F (37 C) (Oral)   Ht 6' 2 (1.88 m)   Wt (!) 390 lb (176.9 kg)   LMP 04/11/2024   SpO2 97%   BMI 50.07 kg/m       Objective:   Physical Exam Vitals reviewed.   Cardiovascular:     Rate and Rhythm: Normal rate and regular rhythm.     Pulses: Normal pulses.     Heart sounds: Normal heart sounds.  Pulmonary:     Effort: Pulmonary effort is  normal.     Breath sounds: Normal breath sounds.   Musculoskeletal:        General: Normal range of motion.   Skin:    General: Skin is warm and dry.   Neurological:     General: No focal deficit present.     Mental Status: She is  oriented to person, place, and time.   Psychiatric:        Mood and Affect: Mood normal.        Behavior: Behavior normal.        Thought Content: Thought content normal.        Judgment: Judgment normal.       Assessment & Plan:  1. Primary hypertension (Primary) - Will increase her hydrochlorothiazide  to 25 mg daily.  - She will send me her BP readings over the next 2 weeks  - hydrochlorothiazide  (HYDRODIURIL ) 25 MG tablet; Take 1 tablet (25 mg total) by mouth daily.  Dispense: 90 tablet; Refill: 3  Yomaris Palecek, NP

## 2024-04-13 ENCOUNTER — Ambulatory Visit: Admitting: Adult Health

## 2024-04-15 ENCOUNTER — Ambulatory Visit: Admitting: Adult Health

## 2024-04-17 DIAGNOSIS — M25571 Pain in right ankle and joints of right foot: Secondary | ICD-10-CM | POA: Diagnosis not present

## 2024-04-17 DIAGNOSIS — R7303 Prediabetes: Secondary | ICD-10-CM | POA: Diagnosis not present

## 2024-04-17 DIAGNOSIS — M25511 Pain in right shoulder: Secondary | ICD-10-CM | POA: Diagnosis not present

## 2024-04-17 DIAGNOSIS — N914 Secondary oligomenorrhea: Secondary | ICD-10-CM | POA: Diagnosis not present

## 2024-04-17 DIAGNOSIS — M51362 Other intervertebral disc degeneration, lumbar region with discogenic back pain and lower extremity pain: Secondary | ICD-10-CM | POA: Diagnosis not present

## 2024-04-17 DIAGNOSIS — Z79899 Other long term (current) drug therapy: Secondary | ICD-10-CM | POA: Diagnosis not present

## 2024-04-17 DIAGNOSIS — M25512 Pain in left shoulder: Secondary | ICD-10-CM | POA: Diagnosis not present

## 2024-04-17 DIAGNOSIS — E559 Vitamin D deficiency, unspecified: Secondary | ICD-10-CM | POA: Diagnosis not present

## 2024-04-17 DIAGNOSIS — D539 Nutritional anemia, unspecified: Secondary | ICD-10-CM | POA: Diagnosis not present

## 2024-04-17 DIAGNOSIS — M542 Cervicalgia: Secondary | ICD-10-CM | POA: Diagnosis not present

## 2024-04-17 DIAGNOSIS — Z6841 Body Mass Index (BMI) 40.0 and over, adult: Secondary | ICD-10-CM | POA: Diagnosis not present

## 2024-04-18 DIAGNOSIS — M542 Cervicalgia: Secondary | ICD-10-CM | POA: Diagnosis not present

## 2024-04-18 DIAGNOSIS — M25512 Pain in left shoulder: Secondary | ICD-10-CM | POA: Diagnosis not present

## 2024-04-18 DIAGNOSIS — M25511 Pain in right shoulder: Secondary | ICD-10-CM | POA: Diagnosis not present

## 2024-04-19 DIAGNOSIS — Z5986 Financial insecurity: Secondary | ICD-10-CM | POA: Diagnosis not present

## 2024-04-19 DIAGNOSIS — F4323 Adjustment disorder with mixed anxiety and depressed mood: Secondary | ICD-10-CM | POA: Diagnosis not present

## 2024-04-19 DIAGNOSIS — Z79899 Other long term (current) drug therapy: Secondary | ICD-10-CM | POA: Diagnosis not present

## 2024-04-21 DIAGNOSIS — Z6841 Body Mass Index (BMI) 40.0 and over, adult: Secondary | ICD-10-CM | POA: Diagnosis not present

## 2024-04-21 DIAGNOSIS — K912 Postsurgical malabsorption, not elsewhere classified: Secondary | ICD-10-CM | POA: Diagnosis not present

## 2024-04-21 DIAGNOSIS — Z903 Acquired absence of stomach [part of]: Secondary | ICD-10-CM | POA: Diagnosis not present

## 2024-04-21 DIAGNOSIS — E66813 Obesity, class 3: Secondary | ICD-10-CM | POA: Diagnosis not present

## 2024-04-26 ENCOUNTER — Encounter: Payer: Self-pay | Admitting: *Deleted

## 2024-04-26 DIAGNOSIS — Z5989 Other problems related to housing and economic circumstances: Secondary | ICD-10-CM | POA: Diagnosis not present

## 2024-04-26 DIAGNOSIS — F4323 Adjustment disorder with mixed anxiety and depressed mood: Secondary | ICD-10-CM | POA: Diagnosis not present

## 2024-04-27 ENCOUNTER — Encounter: Payer: Self-pay | Admitting: Certified Nurse Midwife

## 2024-04-27 ENCOUNTER — Other Ambulatory Visit (HOSPITAL_COMMUNITY)
Admission: RE | Admit: 2024-04-27 | Discharge: 2024-04-27 | Disposition: A | Discharge status: Home / Self Care | Source: Ambulatory Visit | Attending: Certified Nurse Midwife | Admitting: Certified Nurse Midwife

## 2024-04-27 ENCOUNTER — Other Ambulatory Visit: Payer: Self-pay

## 2024-04-27 ENCOUNTER — Ambulatory Visit: Admitting: Certified Nurse Midwife

## 2024-04-27 VITALS — BP 106/72 | HR 86 | Wt 386.4 lb

## 2024-04-27 DIAGNOSIS — R87610 Atypical squamous cells of undetermined significance on cytologic smear of cervix (ASC-US): Secondary | ICD-10-CM

## 2024-04-27 DIAGNOSIS — R87810 Cervical high risk human papillomavirus (HPV) DNA test positive: Secondary | ICD-10-CM | POA: Insufficient documentation

## 2024-04-27 DIAGNOSIS — Z01419 Encounter for gynecological examination (general) (routine) without abnormal findings: Secondary | ICD-10-CM | POA: Diagnosis not present

## 2024-04-27 DIAGNOSIS — N920 Excessive and frequent menstruation with regular cycle: Secondary | ICD-10-CM | POA: Diagnosis not present

## 2024-04-28 ENCOUNTER — Ambulatory Visit: Attending: Cardiology | Admitting: Cardiology

## 2024-04-28 ENCOUNTER — Encounter: Payer: Self-pay | Admitting: Cardiology

## 2024-04-28 VITALS — BP 120/64 | HR 80 | Ht 74.0 in | Wt 386.0 lb

## 2024-04-28 DIAGNOSIS — Z79899 Other long term (current) drug therapy: Secondary | ICD-10-CM | POA: Diagnosis not present

## 2024-04-28 DIAGNOSIS — R0789 Other chest pain: Secondary | ICD-10-CM | POA: Insufficient documentation

## 2024-04-28 DIAGNOSIS — I10 Essential (primary) hypertension: Secondary | ICD-10-CM | POA: Diagnosis not present

## 2024-04-28 DIAGNOSIS — R002 Palpitations: Secondary | ICD-10-CM | POA: Insufficient documentation

## 2024-04-28 NOTE — Progress Notes (Signed)
 Cardio-Obstetrics Clinic  Follow Up Note   Date:  04/28/2024   ID:  Maureen Lewis, DOB [birth year], MRN [id number]  PCP:  Merna Huxley, NP   [facility] Providers Cardiologist:  Dub Huntsman, DO  Electrophysiologist:  None        Referring MD: Merna Huxley, NP   Chief Complaint:  I am doing ok  History of Present Illness:    Maureen Lewis is a 28 y.o. female [G4P2022] who returns for follow up of chronic hypertension.   I last saw the patient in March 2025 at that time she was still experiencing elevated blood pressure.  Now with suspicion of chronic hypertension I she was already on amlodipine  10 mg twice daily.  I switched this to amlodipine  10 mg daily and added hydrochlorothiazide  12.5 mg daily. Repeated echocardiogram which was normal.  Today she tells me that she is experiencing intermittent chest discomfort.  She describes it as a sharp radiating pain.  At times it occurs with palpitations.  She wore a monitor however tells me that this was sent back but unfortunately we have not received this yet.  Prior CV Studies Reviewed: The following studies were reviewed today: Echo   Past Medical History:  Diagnosis Date   Abnormal uterine bleeding    Acid reflux    Alpha thalassemia trait    Amenorrhea    Anemia    no current med.   Asthma    Back pain    Chest pain    resolved   Complication of anesthesia    states had to keep giving her anesthesia during EGD   Constipation    Depression    I'm good   Fibromyalgia    Finger mass, left 03/2017   middle finger   Gestational hypertension 09/23/2019   Guidelines for Antenatal Testing and Sonography  (with updated ICD-10 codes)  Updated  20-Sep-2019 with Dr. Fredia Fresh  INDICATION U/S 2 X week NST/AFI  or full BPP wkly DELIVERY Diabetes   A1 - good control - O24.410    A2 - good control - O24.419      A2  - poor control or poor compliance - O24.419, E11.65   (Macrosomia or  polyhydramnios) **E11.65 is extra code for poor control**    A2/B - O24.   Headache    Hepatitis 07/18/2022   Hepatitis B   History of kidney stones    Hypertension    Irritable bowel syndrome (IBS)    Joint pain    Morbid obesity with body mass index (BMI) of 45.0 to 49.9 in adult [facility])    Plantar fasciitis, bilateral    resolved   Polycystic ovary syndrome    Pre-diabetes    no meds, diet controlled, diet not check blood sugar   Prediabetes 09/16/2017   Early 1hr 70     Shortness of breath    albuterol  inhaler   Sleep apnea    no CPAP use   Vertigo 2017    Past Surgical History:  Procedure Laterality Date   CESAREAN SECTION N/A 10/09/2019   Procedure: CESAREAN SECTION;  Surgeon: Izell Harari, MD;  [facility];  Service: Obstetrics;  Laterality: N/A;   CESAREAN SECTION WITH BILATERAL TUBAL LIGATION N/A 12/01/2023   Procedure: CESAREAN SECTION WITH BILATERAL TUBAL LIGATION;  Surgeon: Kandis Devaughn Sayres, MD;  [facility]: [facility];  Service: Obstetrics;  Laterality: N/A;   COLONOSCOPY WITH PROPOFOL   10/07/2016   DILATION AND  CURETTAGE OF UTERUS     ESOPHAGOGASTRODUODENOSCOPY  12/21/2015   EXCISION MASS UPPER EXTREMETIES Left 04/21/2017   Procedure: EXCISION MASS LEFT MIDDLE FINGER;  Surgeon: Murrell Kuba, MD;  Location: [facility];  Service: Orthopedics;  Laterality: Left;   gastric sleeve  05/09/2022   IR FL GUIDED LOC OF NEEDLE/CATH TIP FOR SPINAL INJECTION RT  03/13/2020   KNEE ARTHROSCOPY WITH LATERAL MENISECTOMY Left 10/31/2022   Procedure: KNEE ARTHROSCOPY WITH PARTIAL LATERAL MENISECTOMY;  Surgeon: Sharl Selinda Dover, MD;  Location: [facility];  Service: Orthopedics;  Laterality: Left;  77   KNEE ARTHROSCOPY WITH LATERAL RELEASE Left 10/31/2022   Procedure: KNEE ARTHROSCOPY WITH LATERAL RELEASE AND LIMITED SYNOVECTOMY;  Surgeon: Sharl Selinda Dover, MD;  Location: [facility];  Service: Orthopedics;  Laterality: Left;  77   PHOTOCOAGULATION WITH LASER  Left 12/13/2020   Procedure: RETINOPLEXY LEFT EYE;  Surgeon: Jarold Selinda, MD;  Location: [facility] OR;  Service: Ophthalmology;  Laterality: Left;   SCLERAL BUCKLE Right 12/13/2020   Procedure: SCLERAL BUCKLE WITH CRYO;  Surgeon: Jarold Selinda, MD;  Location: [facility] OR;  Service: Ophthalmology;  Laterality: Right;      OB History     Gravida  4   Para  2   Term  2   Preterm  0   AB  2   Living  2      SAB  2   IAB  0   Ectopic  0   Multiple  0   Live Births  2               Current Medications: Current Meds  Medication Sig   amLODipine  (NORVASC ) 10 MG tablet Take 1 tablet (10 mg total) by mouth daily.   hydrochlorothiazide  (HYDRODIURIL ) 25 MG tablet Take 1 tablet (25 mg total) by mouth daily.   oxyCODONE -acetaminophen  (PERCOCET/ROXICET) 5-325 MG tablet Take 1 tablet by mouth every 6 (six) hours as needed.   pregabalin (LYRICA) 100 MG capsule Take 100 mg by mouth at bedtime.   Prenatal Vit-Fe Phos-FA-Omega (VITAFOL  GUMMIES) 3.33-0.333-34.8 MG CHEW Chew 1 tablet by mouth daily.   tiZANidine  (ZANAFLEX ) 4 MG tablet Take 4 mg by mouth 3 (three) times daily as needed.   tranexamic acid  (LYSTEDA ) 650 MG TABS tablet Take 2 tablets (1,300 mg total) by mouth 3 (three) times daily.   Vitamin D , Ergocalciferol , (DRISDOL ) 1.25 MG (50000 UNIT) CAPS capsule TAKE 1 CAPSULE BY MOUTH EVERY 7 DAYS   WEGOVY  1 MG/0.5ML SOAJ Inject 1 mg into the skin once a week.     Allergies:   Bee venom, Hydrocodone -acetaminophen , Peach flavoring agent (non-screening), Pollen extract, Remdesivir , Latex, Cortisone, Cortizone-10 [hydrocortisone ], and Toradol  [ketorolac  tromethamine ]   Social History   Socioeconomic History   Marital status: Single    Spouse name: Not on file   Number of children: Not on file   Years of education: Not on file   Highest education level: Not on file  Occupational History   Occupation: IT sales professional    Employer: [id number]  Tobacco Use   Smoking status: Never    Smokeless tobacco: Never  Vaping Use   Vaping status: Never Used  Substance and Sexual Activity   Alcohol use: Not Currently    Comment: social    Drug use: No   Sexual activity: Not Currently    Birth control/protection: None  Other Topics Concern   Not on file  Social History Narrative   Right handed    Soda sometimes  Lives with mom and cousin, a grandmother in [place] also helps care for her.       She is in nursing school.    Social Drivers of Health   Financial Resource Strain: Medium Risk (04/21/2024)   Received from [facility]   Overall Financial Resource Strain (CARDIA)    Difficulty of Paying Living Expenses: Somewhat hard  Food Insecurity: Food Insecurity Present (04/27/2024)   Hunger Vital Sign    Worried About Running Out of Food in the Last Year: Sometimes true    Ran Out of Food in the Last Year: Sometimes true  Transportation Needs: No Transportation Needs (04/27/2024)   PRAPARE - Administrator, Civil Service (Medical): No    Lack of Transportation (Non-Medical): No  Physical Activity: Unknown (11/05/2022)   Received from [facility]   Exercise Vital Sign    On average, how many days per week do you engage in moderate to strenuous exercise (like a brisk walk)?: 0 days    Minutes of Exercise per Session: Not on file  Recent Concern: Physical Activity - Inactive (11/05/2022)   Received from [facility]   Exercise Vital Sign    Days of Exercise per Week: 0 days    Minutes of Exercise per Session: 40 min  Stress: Stress Concern Present (11/05/2022)   Received from [facility] - Occupational Stress Questionnaire    Feeling of Stress : Rather much  Social Connections: Somewhat Isolated (11/05/2022)   Received from [facility]   Social Network    How would you rate your social network (family, work, friends)?: Restricted participation with some degree of social isolation      Family History   Problem Relation Age of Onset   Diabetes Maternal Aunt    Hypertension Maternal Uncle    Depression Maternal Grandfather    Diabetes Maternal Grandfather    Hypertension Maternal Grandfather    Arthritis Mother    High blood pressure Mother    Depression Mother    Sleep apnea Mother    Obesity Mother    Diabetes Mother    Hypertension Mother    Kidney failure Mother    Sleep apnea Father    Obesity Father    Healthy Daughter       ROS:   Please see the history of present illness.     All other systems reviewed and are negative.   Labs/EKG Reviewed:    EKG:   EKG was not ordered today.    Recent Labs: 05/07/2023: TSH 0.661 01/08/2024: B Natriuretic Peptide 45.6; Hemoglobin 12.4; Platelets 199 01/12/2024: ALT 9; BUN 8; Creatinine, Ser 0.64; Magnesium  2.0; Potassium 4.3; Sodium 141   Recent Lipid Panel Lab Results  Component Value Date/Time   CHOL 118 11/08/2020 11:31 AM   CHOL 156 04/09/2020 12:52 PM   TRIG 64.0 11/08/2020 11:31 AM   HDL 36.00 (L) 11/08/2020 11:31 AM   HDL 42 04/09/2020 12:52 PM   CHOLHDL 3 11/08/2020 11:31 AM   LDLCALC 69 11/08/2020 11:31 AM   LDLCALC 101 (H) 04/09/2020 12:52 PM    Physical Exam:    VS:  BP 120/64 (BP Location: Left Arm, Patient Position: Sitting, Cuff Size: Large)   Pulse 80   Ht 6' 2 (1.88 m)   Wt (!) 386 lb (175.1 kg)   LMP 04/11/2024   SpO2 98%   BMI 49.56 kg/m     Wt Readings from Last 3 Encounters:  04/28/24 (!) 386 lb (175.1 kg)  04/27/24 (!) 386 lb 6.4 oz (175.3 kg)  04/12/24 (!) 390 lb (176.9 kg)     GEN:  Well nourished, well developed in no acute distress HEENT: Normal NECK: No JVD; No carotid bruits LYMPHATICS: No lymphadenopathy CARDIAC: RRR, no murmurs, rubs, gallops RESPIRATORY:  Clear to auscultation without rales, wheezing or rhonchi  ABDOMEN: Soft, non-tender, non-distended MUSCULOSKELETAL:  No edema; No deformity  SKIN: Warm and dry NEUROLOGIC:  Alert and oriented x 3 PSYCHIATRIC:  Normal  affect    Risk Assessment/Risk Calculators:              ASSESSMENT & PLAN:    Chronic hypertension-blood pressure is at target on amlodipine  10 mg daily and hydrochlorothiazide  12.5 mg daily.  Will continue this.   In terms of her atypical chest pain this could be musculoskeletal or inflammatory.  I am going to get some blood work CBC, c-Met with mag, TSH, ESR as well as CRP.  The patient understands the need to lose weight with diet and exercise. We have discussed specific strategies for this.  Patient Instructions  Medication Instructions:  Your physician recommends that you continue on your current medications as directed. Please refer to the Current Medication list given to you today.  *If you need a refill on your cardiac medications before your next appointment, please call your pharmacy*  Lab Work: CMET, Mag, CBC, TSH, ESR, CRP If you have labs (blood work) drawn today and your tests are completely normal, you will receive your results only by: MyChart Message (if you have MyChart) OR A paper copy in the mail If you have any lab test that is abnormal or we need to change your treatment, we will call you to review the results.  Follow-Up: At [facility], you and your health needs are our priority.  As part of our continuing mission to provide you with exceptional heart care, our providers are all part of one team.  This team includes your primary Cardiologist (physician) and Advanced Practice Providers or APPs (Physician Assistants and Nurse Practitioners) who all work together to provide you with the care you need, when you need it.  Your next appointment:   6 month(s)  Provider:   Scotti Motter, DO      Dispo:  No follow-ups on file.   Medication Adjustments/Labs and Tests Ordered: Current medicines are reviewed at length with the patient today.  Concerns regarding medicines are outlined above.  Tests Ordered: Orders Placed This Encounter  Procedures    Comp Met (CMET)   Magnesium    CBC   TSH+T4F+T3Free   Sedimentation rate   C-reactive protein   Medication Changes: No orders of the defined types were placed in this encounter.

## 2024-04-28 NOTE — Patient Instructions (Signed)
 Medication Instructions:  Your physician recommends that you continue on your current medications as directed. Please refer to the Current Medication list given to you today.  *If you need a refill on your cardiac medications before your next appointment, please call your pharmacy*  Lab Work: CMET, Mag, CBC, TSH, ESR, CRP If you have labs (blood work) drawn today and your tests are completely normal, you will receive your results only by: MyChart Message (if you have MyChart) OR A paper copy in the mail If you have any lab test that is abnormal or we need to change your treatment, we will call you to review the results.  Follow-Up: At [facility], you and your health needs are our priority.  As part of our continuing mission to provide you with exceptional heart care, our providers are all part of one team.  This team includes your primary Cardiologist (physician) and Advanced Practice Providers or APPs (Physician Assistants and Nurse Practitioners) who all work together to provide you with the care you need, when you need it.  Your next appointment:   6 month(s)  Provider:   Kardie Tobb, DO

## 2024-04-29 LAB — CBC
Hematocrit: 38.9 % (ref 34.0–46.6)
Hemoglobin: 12.4 g/dL (ref 11.1–15.9)
MCH: 25.3 pg — ABNORMAL LOW (ref 26.6–33.0)
MCHC: 31.9 g/dL (ref 31.5–35.7)
MCV: 79 fL (ref 79–97)
Platelets: 238 x10E3/uL (ref 150–450)
RBC: 4.91 x10E6/uL (ref 3.77–5.28)
RDW: 13.1 % (ref 11.7–15.4)
WBC: 4.8 x10E3/uL (ref 3.4–10.8)

## 2024-04-29 LAB — TSH+T4F+T3FREE
Free T4: 1.17 ng/dL (ref 0.82–1.77)
T3, Free: 2.7 pg/mL (ref 2.0–4.4)
TSH: 0.688 u[IU]/mL (ref 0.450–4.500)

## 2024-04-29 LAB — COMPREHENSIVE METABOLIC PANEL WITH GFR
ALT: 7 IU/L (ref 0–32)
AST: 10 IU/L (ref 0–40)
Albumin: 3.8 g/dL — ABNORMAL LOW (ref 4.0–5.0)
Alkaline Phosphatase: 92 IU/L (ref 44–121)
BUN/Creatinine Ratio: 17 (ref 9–23)
BUN: 9 mg/dL (ref 6–20)
Bilirubin Total: 0.6 mg/dL (ref 0.0–1.2)
CO2: 22 mmol/L (ref 20–29)
Calcium: 9 mg/dL (ref 8.7–10.2)
Chloride: 104 mmol/L (ref 96–106)
Creatinine, Ser: 0.53 mg/dL — ABNORMAL LOW (ref 0.57–1.00)
Globulin, Total: 2.8 g/dL (ref 1.5–4.5)
Glucose: 80 mg/dL (ref 70–99)
Potassium: 4.3 mmol/L (ref 3.5–5.2)
Sodium: 142 mmol/L (ref 134–144)
Total Protein: 6.6 g/dL (ref 6.0–8.5)
eGFR: 132 mL/min/1.73

## 2024-04-29 LAB — SEDIMENTATION RATE: Sed Rate: 30 mm/h (ref 0–32)

## 2024-04-29 LAB — C-REACTIVE PROTEIN: CRP: 10 mg/L (ref 0–10)

## 2024-04-29 LAB — MAGNESIUM: Magnesium: 2 mg/dL (ref 1.6–2.3)

## 2024-05-02 ENCOUNTER — Ambulatory Visit: Payer: Self-pay | Admitting: Cardiology

## 2024-05-04 LAB — CYTOLOGY - PAP
Adequacy: ABSENT
Comment: NEGATIVE
Comment: NEGATIVE
Comment: NEGATIVE
Diagnosis: UNDETERMINED — AB
HPV 16: NEGATIVE
HPV 18 / 45: NEGATIVE
High risk HPV: POSITIVE — AB

## 2024-05-07 ENCOUNTER — Ambulatory Visit: Payer: Self-pay | Admitting: Certified Nurse Midwife

## 2024-05-07 NOTE — Progress Notes (Signed)
 ANNUAL EXAM Patient name: Maureen Lewis MRN [id number]  Date of birth: 01-22-1996 Chief Complaint:   Gynecologic Exam  History of Present Illness:   Maureen Lewis is a 28 y.o. [phone] African-American female being seen today for a routine annual exam.  Current complaints: continued heavy menstruation - not helped by TXA during cycles. Got a BTS during her last delivery.   Patient's last menstrual period was 04/11/2024.  Last pap /18/24. Results were: ASCUS w/ HRHPV positive: other (not 16, 18/45). H/O abnormal pap: yes Last mammogram: Never (age). Results were: N/A. Family h/o breast cancer: no Last colonoscopy: Neer (age). Results were: N/A. Family h/o colorectal cancer: no     04/27/2024    5:55 PM 01/27/2024   12:00 PM 01/21/2024    8:37 AM 08/14/2023    3:26 PM 05/07/2023   11:17 AM  Depression screen PHQ 2/9  Decreased Interest 0 0 0 0 0  Down, Depressed, Hopeless 0 0 1 0 0  PHQ - 2 Score 0 0 1 0 0  Altered sleeping 0 0 0  0  Tired, decreased energy 0 0 3  3  Change in appetite 0 0 3  3  Feeling bad or failure about yourself  0 0 0  0  Trouble concentrating 0 0 0  0  Moving slowly or fidgety/restless 0 0 0  0  Suicidal thoughts 0 0 0  0  PHQ-9 Score 0 0 7  6        04/27/2024    5:55 PM 01/27/2024   12:00 PM 01/21/2024    8:41 AM 05/07/2023   11:17 AM  GAD 7 : Generalized Anxiety Score  Nervous, Anxious, on Edge 0 0 0 0  Control/stop worrying 0 0 0 0  Worry too much - different things 0 0 1 0  Trouble relaxing 0 0 0 0  Restless 0 0 0 0  Easily annoyed or irritable 0 0 3 0  Afraid - awful might happen 0 0 0 0  Total GAD 7 Score 0 0 4 0     Review of Systems:   Pertinent items are noted in HPI Denies any headaches, blurred vision, fatigue, shortness of breath, chest pain, abdominal pain, abnormal vaginal discharge/itching/odor/irritation, problems with periods, bowel movements, urination, or intercourse unless otherwise stated above.  Pertinent  History Reviewed:  Reviewed past medical,surgical, social and family history.  Reviewed problem list, medications and allergies.  Physical Assessment:   Vitals:   04/27/24 1602  BP: 106/72  Pulse: 86  SpO2: 96%  Weight: (!) 386 lb 6.4 oz (175.3 kg)   Body mass index is 49.61 kg/m.   Physical Examination:  General appearance - well appearing, and in no distress Mental status - alert, oriented to person, place, and time Psych:  She has a normal mood and affect Skin - warm and dry, normal color, no suspicious lesions noted Chest - effort normal, no problems with respiration noted Heart - normal rate and regular rhythm Neck:  midline trachea, no thyromegaly or nodules Breasts - breasts appear normal, no suspicious masses, no skin or nipple changes or  axillary nodes Abdomen - soft, nontender, nondistended, no masses or organomegaly Pelvic - VULVA: normal appearing vulva with no masses, tenderness or lesions   VAGINA: normal appearing vagina with normal color and discharge, no lesions   CERVIX: normal appearing cervix without discharge or lesions, no CMT Thin prep pap is done with HR HPV cotesting Extremities:  No swelling or varicosities noted  Chaperone present for exam  No results found. However, due to the size of the patient record, not all encounters were searched. Please check Results Review for a complete set of results.  Assessment & Plan:      1. Atypical squamous cell changes of undetermined significance (ASCUS) on cervical cytology with positive high risk human papilloma virus (HPV) (Primary) - Cytology - PAP( [place]) - Will follow up results of pap smear and manage accordingly.  2. Menorrhagia with regular cycle - Will refer to Dr. Jeralyn for evaluation and treatment   Future Appointments  Date Time Provider Department Center  06/02/2024  3:35 PM Jeralyn Crutch, MD [facility]    [place] JONELLE Finder, CNM 05/07/2024 10:29 AM

## 2024-05-10 DIAGNOSIS — F4323 Adjustment disorder with mixed anxiety and depressed mood: Secondary | ICD-10-CM | POA: Diagnosis not present

## 2024-05-10 DIAGNOSIS — Z6379 Other stressful life events affecting family and household: Secondary | ICD-10-CM | POA: Diagnosis not present

## 2024-05-14 DIAGNOSIS — R7303 Prediabetes: Secondary | ICD-10-CM | POA: Diagnosis not present

## 2024-05-14 DIAGNOSIS — M25511 Pain in right shoulder: Secondary | ICD-10-CM | POA: Diagnosis not present

## 2024-05-14 DIAGNOSIS — Z6841 Body Mass Index (BMI) 40.0 and over, adult: Secondary | ICD-10-CM | POA: Diagnosis not present

## 2024-05-14 DIAGNOSIS — M51362 Other intervertebral disc degeneration, lumbar region with discogenic back pain and lower extremity pain: Secondary | ICD-10-CM | POA: Diagnosis not present

## 2024-05-14 DIAGNOSIS — E538 Deficiency of other specified B group vitamins: Secondary | ICD-10-CM | POA: Diagnosis not present

## 2024-05-14 DIAGNOSIS — M542 Cervicalgia: Secondary | ICD-10-CM | POA: Diagnosis not present

## 2024-05-14 DIAGNOSIS — N914 Secondary oligomenorrhea: Secondary | ICD-10-CM | POA: Diagnosis not present

## 2024-05-14 DIAGNOSIS — E559 Vitamin D deficiency, unspecified: Secondary | ICD-10-CM | POA: Diagnosis not present

## 2024-05-14 DIAGNOSIS — M25512 Pain in left shoulder: Secondary | ICD-10-CM | POA: Diagnosis not present

## 2024-05-14 DIAGNOSIS — M25571 Pain in right ankle and joints of right foot: Secondary | ICD-10-CM | POA: Diagnosis not present

## 2024-05-14 DIAGNOSIS — Z79899 Other long term (current) drug therapy: Secondary | ICD-10-CM | POA: Diagnosis not present

## 2024-05-14 DIAGNOSIS — G8929 Other chronic pain: Secondary | ICD-10-CM | POA: Diagnosis not present

## 2024-05-17 ENCOUNTER — Telehealth: Payer: Self-pay | Admitting: Adult Health

## 2024-05-17 ENCOUNTER — Telehealth (INDEPENDENT_AMBULATORY_CARE_PROVIDER_SITE_OTHER): Admitting: Adult Health

## 2024-05-17 DIAGNOSIS — E611 Iron deficiency: Secondary | ICD-10-CM | POA: Diagnosis not present

## 2024-05-17 DIAGNOSIS — F4323 Adjustment disorder with mixed anxiety and depressed mood: Secondary | ICD-10-CM | POA: Diagnosis not present

## 2024-05-17 DIAGNOSIS — Z630 Problems in relationship with spouse or partner: Secondary | ICD-10-CM | POA: Diagnosis not present

## 2024-05-17 DIAGNOSIS — Z79899 Other long term (current) drug therapy: Secondary | ICD-10-CM | POA: Diagnosis not present

## 2024-05-17 NOTE — Telephone Encounter (Signed)
 Patient has appt on 05/17/2024 at 5pm via video and was told this could not be done via video, patient would like a call letting her know what to do because of her work schedule.  Please advise ASAP at [phone]

## 2024-05-17 NOTE — Progress Notes (Signed)
 Virtual Visit via Video Note  I connected with Maureen Lewis  on 05/17/24 at  5:00 PM EDT by a video enabled telemedicine application and verified that I am speaking with the correct person using two identifiers.  Location patient: home Location provider:work or home office Persons participating in the virtual visit: patient, provider  I discussed the limitations of evaluation and management by telemedicine and the availability of in person appointments. The patient expressed understanding and agreed to proceed.   HPI: She does have a history of iron  deficiency anemia and after her bariatric surgery does not absorb iron  well. She reports having an iron  panel done at [facility] in which her iron  level was 31, Ferritin was 67 and TIBC was 446. She denies symptoms    ROS: See pertinent positives and negatives per HPI.  Past Medical History:  Diagnosis Date   Abnormal uterine bleeding    Acid reflux    Alpha thalassemia trait    Amenorrhea    Anemia    no current med.   Asthma    Back pain    Chest pain    resolved   Complication of anesthesia    states had to keep giving her anesthesia during EGD   Constipation    Depression    I'm good   Fibromyalgia    Finger mass, left 03/2017   middle finger   Gestational hypertension 09/23/2019   Guidelines for Antenatal Testing and Sonography  (with updated ICD-10 codes)  Updated  2019/09/20 with Dr. Fredia Fresh  INDICATION U/S 2 X week NST/AFI  or full BPP wkly DELIVERY Diabetes   A1 - good control - O24.410    A2 - good control - O24.419      A2  - poor control or poor compliance - O24.419, E11.65   (Macrosomia or polyhydramnios) **E11.65 is extra code for poor control**    A2/B - O24.   Headache    Hepatitis 07/18/2022   Hepatitis B   History of kidney stones    Hypertension    Irritable bowel syndrome (IBS)    Joint pain    Morbid obesity with body mass index (BMI) of 45.0 to 49.9 in adult [facility])    Plantar  fasciitis, bilateral    resolved   Polycystic ovary syndrome    Pre-diabetes    no meds, diet controlled, diet not check blood sugar   Prediabetes 09/16/2017   Early 1hr 70     Shortness of breath    albuterol  inhaler   Sleep apnea    no CPAP use   Vertigo 2017    Past Surgical History:  Procedure Laterality Date   CESAREAN SECTION N/A 10/09/2019   Procedure: CESAREAN SECTION;  Surgeon: Izell Harari, MD;  [facility];  Service: Obstetrics;  Laterality: N/A;   CESAREAN SECTION WITH BILATERAL TUBAL LIGATION N/A 12/01/2023   Procedure: CESAREAN SECTION WITH BILATERAL TUBAL LIGATION;  Surgeon: Kandis Devaughn Sayres, MD;  [facility];  Service: Obstetrics;  Laterality: N/A;   COLONOSCOPY WITH PROPOFOL   10/07/2016   DILATION AND CURETTAGE OF UTERUS     ESOPHAGOGASTRODUODENOSCOPY  12/21/2015   EXCISION MASS UPPER EXTREMETIES Left 04/21/2017   Procedure: EXCISION MASS LEFT MIDDLE FINGER;  Surgeon: Murrell Kuba, MD;  Location: [facility];  Service: Orthopedics;  Laterality: Left;   gastric sleeve  05/09/2022   IR FL GUIDED LOC OF NEEDLE/CATH TIP FOR SPINAL INJECTION RT  03/13/2020   KNEE ARTHROSCOPY  WITH LATERAL MENISECTOMY Left 10/31/2022   Procedure: KNEE ARTHROSCOPY WITH PARTIAL LATERAL MENISECTOMY;  Surgeon: Sharl Selinda Dover, MD;  [facility]: [facility];  Service: Orthopedics;  Laterality: Left;  75   KNEE ARTHROSCOPY WITH LATERAL RELEASE Left 10/31/2022   Procedure: KNEE ARTHROSCOPY WITH LATERAL RELEASE AND LIMITED SYNOVECTOMY;  Surgeon: Sharl Selinda Dover, MD;  Location: [facility];  Service: Orthopedics;  Laterality: Left;  75   PHOTOCOAGULATION WITH LASER Left 12/13/2020   Procedure: RETINOPLEXY LEFT EYE;  Surgeon: Jarold Selinda, MD;  Location: [facility] OR;  Service: Ophthalmology;  Laterality: Left;   SCLERAL BUCKLE Right 12/13/2020   Procedure: SCLERAL BUCKLE WITH CRYO;  Surgeon: Jarold Selinda, MD;  [facility];  Service: Ophthalmology;  Laterality: Right;     Family History  Problem Relation Age of Onset   Diabetes Maternal Aunt    Hypertension Maternal Uncle    Depression Maternal Grandfather    Diabetes Maternal Grandfather    Hypertension Maternal Grandfather    Arthritis Mother    High blood pressure Mother    Depression Mother    Sleep apnea Mother    Obesity Mother    Diabetes Mother    Hypertension Mother    Kidney failure Mother    Sleep apnea Father    Obesity Father    Healthy Daughter        Current Outpatient Medications:    amLODipine  (NORVASC ) 10 MG tablet, Take 1 tablet (10 mg total) by mouth daily., Disp: 90 tablet, Rfl: 3   hydrochlorothiazide  (HYDRODIURIL ) 25 MG tablet, Take 1 tablet (25 mg total) by mouth daily., Disp: 90 tablet, Rfl: 3   oxyCODONE -acetaminophen  (PERCOCET/ROXICET) 5-325 MG tablet, Take 1 tablet by mouth every 6 (six) hours as needed., Disp: , Rfl:    pregabalin (LYRICA) 100 MG capsule, Take 100 mg by mouth at bedtime., Disp: , Rfl:    Prenatal Vit-Fe Phos-FA-Omega (VITAFOL  GUMMIES) 3.33-0.333-34.8 MG CHEW, Chew 1 tablet by mouth daily., Disp: 90 tablet, Rfl: 3   tiZANidine  (ZANAFLEX ) 4 MG tablet, Take 4 mg by mouth 3 (three) times daily as needed., Disp: , Rfl:    tranexamic acid  (LYSTEDA ) 650 MG TABS tablet, Take 2 tablets (1,300 mg total) by mouth 3 (three) times daily., Disp: 30 tablet, Rfl: 0   Vitamin D , Ergocalciferol , (DRISDOL ) 1.25 MG (50000 UNIT) CAPS capsule, TAKE 1 CAPSULE BY MOUTH EVERY 7 DAYS, Disp: 10 capsule, Rfl: 0   WEGOVY  1 MG/0.5ML SOAJ, Inject 1 mg into the skin once a week., Disp: , Rfl:   EXAM:  VITALS per patient if applicable:  GENERAL: alert, oriented, appears well and in no acute distress  HEENT: atraumatic, conjunttiva clear, no obvious abnormalities on inspection of external nose and ears  NECK: normal movements of the head and neck  LUNGS: on inspection no signs of respiratory distress, breathing rate appears normal, no obvious gross SOB, gasping or  wheezing  CV: no obvious cyanosis  MS: moves all visible extremities without noticeable abnormality  PSYCH/NEURO: pleasant and cooperative, no obvious depression or anxiety, speech and thought processing grossly intact  ASSESSMENT AND PLAN:  Discussed the following assessment and plan:  1. Iron  deficiency (Primary) - Will recheck her labs since these were done a month ago. Consider iron  infusion if needed - CBC; Future - IBC + Ferritin; Future      I discussed the assessment and treatment plan with the patient. The patient was provided an opportunity to ask questions and all were answered. The patient agreed with  the plan and demonstrated an understanding of the instructions.   The patient was advised to call back or seek an in-person evaluation if the symptoms worsen or if the condition fails to improve as anticipated.   Jenisha Faison, NP

## 2024-06-01 DIAGNOSIS — F439 Reaction to severe stress, unspecified: Secondary | ICD-10-CM | POA: Diagnosis not present

## 2024-06-02 ENCOUNTER — Encounter: Payer: Self-pay | Admitting: Obstetrics and Gynecology

## 2024-06-02 ENCOUNTER — Other Ambulatory Visit: Payer: Self-pay

## 2024-06-02 ENCOUNTER — Ambulatory Visit: Admitting: Obstetrics and Gynecology

## 2024-06-02 VITALS — BP 126/82 | HR 90 | Wt 386.0 lb

## 2024-06-02 DIAGNOSIS — N920 Excessive and frequent menstruation with regular cycle: Secondary | ICD-10-CM | POA: Diagnosis not present

## 2024-06-02 MED ORDER — NORETHINDRONE ACETATE 5 MG PO TABS: 10.0000 mg | ORAL_TABLET | Freq: Every day | ORAL | 2 refills | Status: AC

## 2024-06-02 NOTE — Progress Notes (Signed)
 GYNECOLOGY VISIT  Patient name: Maureen Lewis MRN [id number]  Date of birth: 21-Dec-1995 Chief Complaint:   Dysuria   History:  Maureen Lewis is a 28 y.o. [phone] being seen today for AUB and dysmenorrhea. Monthly, heavy menses and has pain just prior to onset of menses.    Discussed the use of AI scribe software for clinical note transcription with the patient, who gave verbal consent to proceed.  History of Present Illness Maureen Lewis is a 28 year old female with PCOS who presents with menstrual irregularities and associated symptoms.  She has experienced heavy and painful periods since before her pregnancies, with no relief from various treatments including pain medications like Tylenol, hormonal IUDs, birth control pills, and Depo-Provera. Despite these interventions, her periods have remained heavy and painful.  The pain begins approximately two weeks before her cycle and intensifies as her period approaches. It is constant throughout the month, with increased severity during her period. She experiences sharp pains and vomiting a week before her period, which she associates with the onset of her cycle. The pain is described as 'very painful' and 'sharp' in the pelvic region.  She has tried various hormonal IUDs, but they did not alleviate her symptoms. She also used an IUD for contraception before having a tubal ligation during her last C-section. Despite these measures, her periods remained heavy and painful.  She has a history of a D&C following a miscarriage, but this was not related to her bleeding issues. She reports that a doctor previously told her she had PCOS, but treatments have not resolved her symptoms.  She currently takes tizanidine daily, primarily for knee and lower back pain due to a torn meniscus and back issues, but notes that it does not alleviate her pelvic pain. She also takes amlodipine and hydrochlorothiazide for blood  pressure management.  Her periods have always been heavy, even with hormonal treatments, and she has to use multiple layers of protection during her cycle due to the heavy bleeding. The bleeding is so heavy that it requires frequent changes of pads, sometimes every 10 to 15 minutes.  No pain with urination or defecation. Reports pain with intercourse. Experiences nausea and vomiting associated with her menstrual cycle.    Past Medical History:  Diagnosis Date   Abnormal uterine bleeding    Acid reflux    Alpha thalassemia trait    Amenorrhea    Anemia    no current med.   Asthma    Back pain    Chest pain    resolved   Complication of anesthesia    states had to keep giving her anesthesia during EGD   Constipation    Depression    I'm good   Fibromyalgia    Finger mass, left 03/2017   middle finger   Gestational hypertension 09/23/2019   Guidelines for Antenatal Testing and Sonography  (with updated ICD-10 codes)  Updated  Sep 16, 2019 with Dr. Fredia Fresh  INDICATION U/S 2 X week NST/AFI  or full BPP wkly DELIVERY Diabetes   A1 - good control - O24.410    A2 - good control - O24.419      A2  - poor control or poor compliance - O24.419, E11.65   (Macrosomia or polyhydramnios) **E11.65 is extra code for poor control**    A2/B - O24.   Headache    Hepatitis 07/18/2022   Hepatitis B   History of kidney stones  Hypertension    Irritable bowel syndrome (IBS)    Joint pain    Morbid obesity with body mass index (BMI) of 45.0 to 49.9 in adult [facility])    Plantar fasciitis, bilateral    resolved   Polycystic ovary syndrome    Pre-diabetes    no meds, diet controlled, diet not check blood sugar   Prediabetes 09/16/2017   Early 1hr 70     Shortness of breath    albuterol inhaler   Sleep apnea    no CPAP use   Vertigo 2017    Past Surgical History:  Procedure Laterality Date   CESAREAN SECTION N/A 10/09/2019   Procedure: CESAREAN SECTION;  Surgeon: Izell Harari, MD;   [facility];  Service: Obstetrics;  Laterality: N/A;   CESAREAN SECTION WITH BILATERAL TUBAL LIGATION N/A 12/01/2023   Procedure: CESAREAN SECTION WITH BILATERAL TUBAL LIGATION;  Surgeon: Kandis Devaughn Sayres, MD;  Location: [facility];  Service: Obstetrics;  Laterality: N/A;   COLONOSCOPY WITH PROPOFOL  10/07/2016   DILATION AND CURETTAGE OF UTERUS     ESOPHAGOGASTRODUODENOSCOPY  12/21/2015   EXCISION MASS UPPER EXTREMETIES Left 04/21/2017   Procedure: EXCISION MASS LEFT MIDDLE FINGER;  Surgeon: Murrell Kuba, MD;  Location: [facility];  Service: Orthopedics;  Laterality: Left;   gastric sleeve  05/09/2022   IR FL GUIDED LOC OF NEEDLE/CATH TIP FOR SPINAL INJECTION RT  03/13/2020   KNEE ARTHROSCOPY WITH LATERAL MENISECTOMY Left 10/31/2022   Procedure: KNEE ARTHROSCOPY WITH PARTIAL LATERAL MENISECTOMY;  Surgeon: Sharl Selinda Dover, MD;  Location: [facility];  Service: Orthopedics;  Laterality: Left;  77   KNEE ARTHROSCOPY WITH LATERAL RELEASE Left 10/31/2022   Procedure: KNEE ARTHROSCOPY WITH LATERAL RELEASE AND LIMITED SYNOVECTOMY;  Surgeon: Sharl Selinda Dover, MD;  Location: [facility];  Service: Orthopedics;  Laterality: Left;  77   PHOTOCOAGULATION WITH LASER Left 12/13/2020   Procedure: RETINOPLEXY LEFT EYE;  Surgeon: Jarold Selinda, MD;  Location: [facility] OR;  Service: Ophthalmology;  Laterality: Left;   SCLERAL BUCKLE Right 12/13/2020   Procedure: SCLERAL BUCKLE WITH CRYO;  Surgeon: Jarold Selinda, MD;  Location: [facility] OR;  Service: Ophthalmology;  Laterality: Right;    The following portions of the patient's history were reviewed and updated as appropriate: allergies, current medications, past family history, past medical history, past social history, past surgical history and problem list.   Health Maintenance:   Last pap     Component Value Date/Time   DIAGPAP (A) 04/27/2024 1703    - Atypical squamous cells of undetermined significance (ASC-US )   DIAGPAP (A) 05/07/2023 1137     - Atypical squamous cells of undetermined significance (ASC-US )   DIAGPAP  04/11/2020 1444    - Negative for intraepithelial lesion or malignancy (NILM)   HPVHIGH Positive (A) 04/27/2024 1703   HPVHIGH Positive (A) 05/07/2023 1137   HPVHIGH Negative 04/11/2020 1444   ADEQPAP  04/27/2024 1703    Satisfactory for evaluation; transformation zone component ABSENT.   ADEQPAP Satisfactory for evaluation.  The absence of an 05/07/2023 1137   ADEQPAP  05/07/2023 1137    endocervical/transformation zone component is not uncommon in pregnant   ADEQPAP patients. 05/07/2023 1137   Last mammogram: n/a  Review of Systems:  Pertinent items are noted in HPI. Comprehensive review of systems was otherwise negative.   Objective:  Physical Exam BP 126/82   Pulse 90   Wt (!) 386 lb (175.1 kg)   LMP 05/09/2024   Breastfeeding No  BMI 49.56 kg/m    Physical Exam Vitals and nursing note reviewed.  Constitutional:      Appearance: Normal appearance.  HENT:     Head: Normocephalic and atraumatic.  Pulmonary:     Effort: Pulmonary effort is normal.  Skin:    General: Skin is warm and dry.  Neurological:     General: No focal deficit present.     Mental Status: She is alert.  Psychiatric:        Mood and Affect: Mood normal.        Behavior: Behavior normal.        Thought Content: Thought content normal.        Judgment: Judgment normal.       Assessment & Plan:   Assessment & Plan Heavy and painful menstrual bleeding with chronic pelvic pain Chronic heavy and painful menstrual bleeding with chronic pelvic pain unresponsive to hormonal treatments. Differential includes endometrial pathology. Endometrial biopsy considered to rule out hyperplasia or malignancy. - Prescribe norethindrone, 2 pills daily, to manage bleeding. Start with 1 pill and increase to 2 if needed. - Discontinue tranexamic acid (TXA). - Schedule pelvic exam and colposcopy with endometrial biopsy at next visit. -  Evaluate pelvic floor during next visit. - Consider pelvic floor physical therapy if indicated.  Polycystic ovary syndrome (PCOS) PCOS with persistent symptoms of heavy menstrual bleeding and pelvic pain.  Hypertension Hypertension managed with amlodipine and hydrochlorothiazide. Blood pressure control crucial due to history of preeclampsia. - Continue amlodipine and hydrochlorothiazide for blood pressure management.    Carter Quarry, MD [facility] for [facility], [facility]

## 2024-06-07 DIAGNOSIS — F4323 Adjustment disorder with mixed anxiety and depressed mood: Secondary | ICD-10-CM | POA: Diagnosis not present

## 2024-06-07 DIAGNOSIS — Z6379 Other stressful life events affecting family and household: Secondary | ICD-10-CM | POA: Diagnosis not present

## 2024-06-11 DIAGNOSIS — M25511 Pain in right shoulder: Secondary | ICD-10-CM | POA: Diagnosis not present

## 2024-06-11 DIAGNOSIS — R030 Elevated blood-pressure reading, without diagnosis of hypertension: Secondary | ICD-10-CM | POA: Diagnosis not present

## 2024-06-11 DIAGNOSIS — M542 Cervicalgia: Secondary | ICD-10-CM | POA: Diagnosis not present

## 2024-06-11 DIAGNOSIS — Z6841 Body Mass Index (BMI) 40.0 and over, adult: Secondary | ICD-10-CM | POA: Diagnosis not present

## 2024-06-11 DIAGNOSIS — Z79899 Other long term (current) drug therapy: Secondary | ICD-10-CM | POA: Diagnosis not present

## 2024-06-11 DIAGNOSIS — M25512 Pain in left shoulder: Secondary | ICD-10-CM | POA: Diagnosis not present

## 2024-06-11 DIAGNOSIS — N914 Secondary oligomenorrhea: Secondary | ICD-10-CM | POA: Diagnosis not present

## 2024-06-11 DIAGNOSIS — M25571 Pain in right ankle and joints of right foot: Secondary | ICD-10-CM | POA: Diagnosis not present

## 2024-06-14 DIAGNOSIS — Z6379 Other stressful life events affecting family and household: Secondary | ICD-10-CM | POA: Diagnosis not present

## 2024-06-14 DIAGNOSIS — F4323 Adjustment disorder with mixed anxiety and depressed mood: Secondary | ICD-10-CM | POA: Diagnosis not present

## 2024-06-15 DIAGNOSIS — F439 Reaction to severe stress, unspecified: Secondary | ICD-10-CM | POA: Diagnosis not present

## 2024-06-16 DIAGNOSIS — Z79899 Other long term (current) drug therapy: Secondary | ICD-10-CM | POA: Diagnosis not present

## 2024-06-21 DIAGNOSIS — F4323 Adjustment disorder with mixed anxiety and depressed mood: Secondary | ICD-10-CM | POA: Diagnosis not present

## 2024-06-21 DIAGNOSIS — Z630 Problems in relationship with spouse or partner: Secondary | ICD-10-CM | POA: Diagnosis not present

## 2024-06-28 DIAGNOSIS — F4323 Adjustment disorder with mixed anxiety and depressed mood: Secondary | ICD-10-CM | POA: Diagnosis not present

## 2024-06-28 DIAGNOSIS — G4733 Obstructive sleep apnea (adult) (pediatric): Secondary | ICD-10-CM | POA: Diagnosis not present

## 2024-06-28 DIAGNOSIS — Z6841 Body Mass Index (BMI) 40.0 and over, adult: Secondary | ICD-10-CM | POA: Diagnosis not present

## 2024-06-28 DIAGNOSIS — Z6379 Other stressful life events affecting family and household: Secondary | ICD-10-CM | POA: Diagnosis not present

## 2024-06-28 DIAGNOSIS — E66813 Obesity, class 3: Secondary | ICD-10-CM | POA: Diagnosis not present

## 2024-06-29 DIAGNOSIS — F439 Reaction to severe stress, unspecified: Secondary | ICD-10-CM | POA: Diagnosis not present

## 2024-07-05 DIAGNOSIS — Z6379 Other stressful life events affecting family and household: Secondary | ICD-10-CM | POA: Diagnosis not present

## 2024-07-05 DIAGNOSIS — F4323 Adjustment disorder with mixed anxiety and depressed mood: Secondary | ICD-10-CM | POA: Diagnosis not present

## 2024-07-09 DIAGNOSIS — E611 Iron deficiency: Secondary | ICD-10-CM | POA: Diagnosis not present

## 2024-07-09 DIAGNOSIS — G8929 Other chronic pain: Secondary | ICD-10-CM | POA: Diagnosis not present

## 2024-07-09 DIAGNOSIS — M79641 Pain in right hand: Secondary | ICD-10-CM | POA: Diagnosis not present

## 2024-07-09 DIAGNOSIS — N914 Secondary oligomenorrhea: Secondary | ICD-10-CM | POA: Diagnosis not present

## 2024-07-09 DIAGNOSIS — R0602 Shortness of breath: Secondary | ICD-10-CM | POA: Diagnosis not present

## 2024-07-09 DIAGNOSIS — R7303 Prediabetes: Secondary | ICD-10-CM | POA: Diagnosis not present

## 2024-07-09 DIAGNOSIS — E538 Deficiency of other specified B group vitamins: Secondary | ICD-10-CM | POA: Diagnosis not present

## 2024-07-09 DIAGNOSIS — Z79899 Other long term (current) drug therapy: Secondary | ICD-10-CM | POA: Diagnosis not present

## 2024-07-09 DIAGNOSIS — E559 Vitamin D deficiency, unspecified: Secondary | ICD-10-CM | POA: Diagnosis not present

## 2024-07-09 DIAGNOSIS — Z6841 Body Mass Index (BMI) 40.0 and over, adult: Secondary | ICD-10-CM | POA: Diagnosis not present

## 2024-07-09 DIAGNOSIS — M79642 Pain in left hand: Secondary | ICD-10-CM | POA: Diagnosis not present

## 2024-07-11 DIAGNOSIS — F439 Reaction to severe stress, unspecified: Secondary | ICD-10-CM | POA: Diagnosis not present

## 2024-07-12 DIAGNOSIS — Z6379 Other stressful life events affecting family and household: Secondary | ICD-10-CM | POA: Diagnosis not present

## 2024-07-12 DIAGNOSIS — Z79899 Other long term (current) drug therapy: Secondary | ICD-10-CM | POA: Diagnosis not present

## 2024-07-12 DIAGNOSIS — F4323 Adjustment disorder with mixed anxiety and depressed mood: Secondary | ICD-10-CM | POA: Diagnosis not present

## 2024-07-19 DIAGNOSIS — F4323 Adjustment disorder with mixed anxiety and depressed mood: Secondary | ICD-10-CM | POA: Diagnosis not present

## 2024-07-19 DIAGNOSIS — Z630 Problems in relationship with spouse or partner: Secondary | ICD-10-CM | POA: Diagnosis not present

## 2024-07-19 DIAGNOSIS — R4581 Low self-esteem: Secondary | ICD-10-CM | POA: Diagnosis not present

## 2024-07-21 ENCOUNTER — Encounter: Payer: Self-pay | Admitting: Obstetrics and Gynecology

## 2024-07-21 ENCOUNTER — Ambulatory Visit (INDEPENDENT_AMBULATORY_CARE_PROVIDER_SITE_OTHER): Admitting: Obstetrics and Gynecology

## 2024-07-21 ENCOUNTER — Other Ambulatory Visit (HOSPITAL_COMMUNITY)
Admission: RE | Admit: 2024-07-21 | Discharge: 2024-07-21 | Disposition: A | Source: Ambulatory Visit | Attending: Obstetrics and Gynecology | Admitting: Obstetrics and Gynecology

## 2024-07-21 ENCOUNTER — Other Ambulatory Visit: Payer: Self-pay

## 2024-07-21 VITALS — BP 135/78 | HR 84 | Wt 390.5 lb

## 2024-07-21 DIAGNOSIS — Z3202 Encounter for pregnancy test, result negative: Secondary | ICD-10-CM

## 2024-07-21 DIAGNOSIS — R87610 Atypical squamous cells of undetermined significance on cytologic smear of cervix (ASC-US): Secondary | ICD-10-CM | POA: Diagnosis not present

## 2024-07-21 DIAGNOSIS — N920 Excessive and frequent menstruation with regular cycle: Secondary | ICD-10-CM

## 2024-07-21 DIAGNOSIS — R87810 Cervical high risk human papillomavirus (HPV) DNA test positive: Secondary | ICD-10-CM | POA: Insufficient documentation

## 2024-07-21 LAB — POCT PREGNANCY, URINE: Preg Test, Ur: NEGATIVE

## 2024-07-21 MED ORDER — ACETAMINOPHEN 500 MG PO TABS
1000.0000 mg | ORAL_TABLET | Freq: Once | ORAL | Status: AC
  Administered 2024-07-21: 1000 mg via ORAL

## 2024-07-21 NOTE — Progress Notes (Signed)
    GYNECOLOGY OFFICE PROCEDURE NOTE  28 y.o. [id number] here for colposcopy and EMB for atypical squamous cellularity of undetermined significance (ASCUS)/HPV pos pap smear on 04/2024 and HVB with regular cycle.   Pregnancy test:  not indicated Gardasil:  completed. Discussed role for HPV in cervical dysplasia, need for surveillance.  Informed consent and review of risks, benefit and alternatives performed. Written consent given.   Speculum inserted into patient's vagina assuring full view of cervix and vaginal walls. 3 swabs of vinegar solution applied to the cervix and vaginal walls and colposcope was used to observe both the cervix and vaginal walls.   Colposcopy adequate? No  no visible lesions, no mosaicism, no punctation, and no abnormal vasculature; ECC collected.  Cervix was then prepped with betadine. Endometrial pipelle inserted into the endometrial cavity and moderate amount of tissue retrieved   All specimens were labeled and sent to pathology.  Monsel's applied to biopsy sites for good hemostasis and speculum removed.  Pt tolerated well with minimal pain and bleeding.   Patient was given post procedure instructions.  Will follow up pathology and manage accordingly; patient will be contacted with results and recommendations.  Routine preventative health maintenance measures emphasized.    Vina Solian, MD, FACOG Obstetrician & Gynecologist, [facility] for [facility], [facility]

## 2024-07-23 ENCOUNTER — Other Ambulatory Visit: Payer: Self-pay | Admitting: Physician Assistant

## 2024-07-25 ENCOUNTER — Ambulatory Visit: Payer: Self-pay | Admitting: Obstetrics and Gynecology

## 2024-07-25 ENCOUNTER — Encounter: Payer: Self-pay | Admitting: Adult Health

## 2024-07-25 LAB — SURGICAL PATHOLOGY

## 2024-07-26 ENCOUNTER — Ambulatory Visit: Admitting: Adult Health

## 2024-07-26 ENCOUNTER — Encounter: Payer: Self-pay | Admitting: Adult Health

## 2024-07-26 VITALS — BP 160/90 | HR 83 | Temp 98.6°F | Ht 74.0 in | Wt 388.0 lb

## 2024-07-26 DIAGNOSIS — I10 Essential (primary) hypertension: Secondary | ICD-10-CM

## 2024-07-26 MED ORDER — OLMESARTAN-AMLODIPINE-HCTZ 40-10-25 MG PO TABS: 1.0000 | ORAL_TABLET | Freq: Every day | ORAL | 0 refills | Status: DC

## 2024-07-26 NOTE — Progress Notes (Signed)
 Subjective:    Patient ID: Maureen Lewis, female    DOB: 04-Nov-1995, 28 y.o.   MRN: [id number]  Hypertension   28 year old female who  has a past medical history of Abnormal uterine bleeding, Acid reflux, Alpha thalassemia trait, Amenorrhea, Anemia, Asthma, Back pain, Chest pain, Complication of anesthesia, Constipation, Depression, Fibromyalgia, Finger mass, left (03/2017), Gestational hypertension (09/23/2019), Headache, Hepatitis (07/18/2022), History of kidney stones, Hypertension, Irritable bowel syndrome (IBS), Joint pain, Morbid obesity with body mass index (BMI) of 45.0 to 49.9 in adult [facility] Plantar fasciitis, bilateral, Polycystic ovary syndrome, Pre-diabetes, Prediabetes (09/16/2017), Shortness of breath, Sleep apnea, and Vertigo (2017).  She presents to the office today for elevated home blood pressure readings. She reports that at home and at school ( she is in school to become a CMA) she has been checking her BP with readings in the upwards of 150-160/90's - 110. She does experience headaches and dizziness when her blood pressure is elevated. She is taking Norvasc  10 mg hydrochlorothiazide  25 mg daily.    Review of Systems See HPI   Past Medical History:  Diagnosis Date   Abnormal uterine bleeding    Acid reflux    Alpha thalassemia trait    Amenorrhea    Anemia    no current med.   Asthma    Back pain    Chest pain    resolved   Complication of anesthesia    states had to keep giving her anesthesia during EGD   Constipation    Depression    I'm good   Fibromyalgia    Finger mass, left 03/2017   middle finger   Gestational hypertension 09/23/2019   Guidelines for Antenatal Testing and Sonography  (with updated ICD-10 codes)  Updated  09/11/19 with Dr. Fredia Fresh  INDICATION U/S 2 X week NST/AFI  or full BPP wkly DELIVERY Diabetes   A1 - good control - O24.410    A2 - good control - O24.419      A2  - poor control or poor compliance - O24.419,  E11.65   (Macrosomia or polyhydramnios) **E11.65 is extra code for poor control**    A2/B - O24.   Headache    Hepatitis 07/18/2022   Hepatitis B   History of kidney stones    Hypertension    Irritable bowel syndrome (IBS)    Joint pain    Morbid obesity with body mass index (BMI) of 45.0 to 49.9 in adult [facility])    Plantar fasciitis, bilateral    resolved   Polycystic ovary syndrome    Pre-diabetes    no meds, diet controlled, diet not check blood sugar   Prediabetes 09/16/2017   Early 1hr 70     Shortness of breath    albuterol  inhaler   Sleep apnea    no CPAP use   Vertigo 2017    Social History   Socioeconomic History   Marital status: Single    Spouse name: Not on file   Number of children: Not on file   Years of education: Not on file   Highest education level: 12th grade  Occupational History   Occupation: Training and development officer: [id number]  Tobacco Use   Smoking status: Never   Smokeless tobacco: Never  Vaping Use   Vaping status: Never Used  Substance and Sexual Activity   Alcohol use: Not Currently    Comment: social    Drug use: No   Sexual activity:  Not Currently    Birth control/protection: None  Other Topics Concern   Not on file  Social History Narrative   Right handed    Soda sometimes   Lives with mom and cousin, a grandmother in [place] also helps care for her.       She is in nursing school.    Social Drivers of Health   Financial Resource Strain: Medium Risk (05/17/2024)   Overall Financial Resource Strain (CARDIA)    Difficulty of Paying Living Expenses: Somewhat hard  Food Insecurity: Food Insecurity Present (07/21/2024)   Hunger Vital Sign    Worried About Running Out of Food in the Last Year: Sometimes true    Ran Out of Food in the Last Year: Sometimes true  Transportation Needs: No Transportation Needs (07/21/2024)   PRAPARE - Administrator, Civil Service (Medical): No    Lack of Transportation (Non-Medical): No   Physical Activity: Insufficiently Active (05/17/2024)   Exercise Vital Sign    Days of Exercise per Week: 4 days    Minutes of Exercise per Session: 30 min  Stress: Stress Concern Present (05/17/2024)   [employer] of Occupational Health - Occupational Stress Questionnaire    Feeling of Stress: To some extent  Social Connections: Moderately Isolated (05/17/2024)   Social Connection and Isolation Panel    Frequency of Communication with Friends and Family: More than three times a week    Frequency of Social Gatherings with Friends and Family: Three times a week    Attends Religious Services: More than 4 times per year    Active Member of Clubs or Organizations: No    Attends Banker Meetings: Not on file    Marital Status: Never married  Intimate Partner Violence: Not At Risk (12/04/2023)   Humiliation, Afraid, Rape, and Kick questionnaire    Fear of Current or Ex-Partner: No    Emotionally Abused: No    Physically Abused: No    Sexually Abused: No    Past Surgical History:  Procedure Laterality Date   CESAREAN SECTION N/A 10/09/2019   Procedure: CESAREAN SECTION;  Surgeon: Izell Harari, MD;  [facility]: [facility];  Service: Obstetrics;  Laterality: N/A;   CESAREAN SECTION WITH BILATERAL TUBAL LIGATION N/A 12/01/2023   Procedure: CESAREAN SECTION WITH BILATERAL TUBAL LIGATION;  Surgeon: Kandis Devaughn Sayres, MD;  [facility]: [facility];  Service: Obstetrics;  Laterality: N/A;   COLONOSCOPY WITH PROPOFOL   10/07/2016   DILATION AND CURETTAGE OF UTERUS     ESOPHAGOGASTRODUODENOSCOPY  12/21/2015   EXCISION MASS UPPER EXTREMETIES Left 04/21/2017   Procedure: EXCISION MASS LEFT MIDDLE FINGER;  Surgeon: Murrell Kuba, MD;  Location: [facility];  Service: Orthopedics;  Laterality: Left;   gastric sleeve  05/09/2022   IR FL GUIDED LOC OF NEEDLE/CATH TIP FOR SPINAL INJECTION RT  03/13/2020   KNEE ARTHROSCOPY WITH LATERAL MENISECTOMY Left 10/31/2022   Procedure: KNEE  ARTHROSCOPY WITH PARTIAL LATERAL MENISECTOMY;  Surgeon: Sharl Selinda Dover, MD;  Location: [facility];  Service: Orthopedics;  Laterality: Left;  77   KNEE ARTHROSCOPY WITH LATERAL RELEASE Left 10/31/2022   Procedure: KNEE ARTHROSCOPY WITH LATERAL RELEASE AND LIMITED SYNOVECTOMY;  Surgeon: Sharl Selinda Dover, MD;  Location: [facility];  Service: Orthopedics;  Laterality: Left;  77   PHOTOCOAGULATION WITH LASER Left 12/13/2020   Procedure: RETINOPLEXY LEFT EYE;  Surgeon: Jarold Selinda, MD;  Location: [facility] OR;  Service: Ophthalmology;  Laterality: Left;   SCLERAL BUCKLE Right 12/13/2020  Procedure: SCLERAL BUCKLE WITH CRYO;  Surgeon: Jarold Mayo, MD;  Location: [facility] OR;  Service: Ophthalmology;  Laterality: Right;    Family History  Problem Relation Age of Onset   Diabetes Maternal Aunt    Hypertension Maternal Uncle    Depression Maternal Grandfather    Diabetes Maternal Grandfather    Hypertension Maternal Grandfather    Arthritis Mother    High blood pressure Mother    Depression Mother    Sleep apnea Mother    Obesity Mother    Diabetes Mother    Hypertension Mother    Kidney failure Mother    Sleep apnea Father    Obesity Father    Healthy Daughter     Allergies  Allergen Reactions   Bee Venom Hives, Shortness Of Breath and Swelling   Hydrocodone -Acetaminophen  Anaphylaxis    No problem when she takes Tylenol    Peach Flavoring Agent (Non-Screening) Hives, Shortness Of Breath and Other (See Comments)    SWELLING OF MOUTH   Pollen Extract Hives, Shortness Of Breath and Swelling   Remdesivir  Swelling    Angioedema 8/25   Latex Rash   Cortisone Itching and Swelling   Cortizone-10 [Hydrocortisone ]     Swelling and itching   Toradol  [Ketorolac  Tromethamine ] Swelling    Current Outpatient Medications on File Prior to Visit  Medication Sig Dispense Refill   norethindrone  (AYGESTIN ) 5 MG tablet Take 2 tablets (10 mg total) by mouth daily. 60 tablet 2   oxyCODONE -acetaminophen   (PERCOCET/ROXICET) 5-325 MG tablet Take 1 tablet by mouth every 6 (six) hours as needed.     pregabalin (LYRICA) 100 MG capsule Take 100 mg by mouth at bedtime.     Prenatal Vit-Fe Phos-FA-Omega (VITAFOL  GUMMIES) 3.33-0.333-34.8 MG CHEW Chew 1 tablet by mouth daily. 90 tablet 3   tiZANidine  (ZANAFLEX ) 4 MG tablet Take 4 mg by mouth 3 (three) times daily as needed.     Vitamin D , Ergocalciferol , (DRISDOL ) 1.25 MG (50000 UNIT) CAPS capsule TAKE 1 CAPSULE BY MOUTH EVERY 7 DAYS 10 capsule 0   WEGOVY  1 MG/0.5ML SOAJ Inject 1 mg into the skin once a week.     No current facility-administered medications on file prior to visit.    BP (!) 160/90 (BP Location: Right Arm)   Pulse 83   Temp 98.6 F (37 C) (Oral)   Ht 6' 2 (1.88 m)   Wt (!) 388 lb (176 kg)   LMP 06/25/2024   SpO2 98%   Breastfeeding No   BMI 49.82 kg/m       Objective:   Physical Exam Vitals and nursing note reviewed.  Constitutional:      Appearance: Normal appearance.  Cardiovascular:     Rate and Rhythm: Normal rate and regular rhythm.     Pulses: Normal pulses.     Heart sounds: Normal heart sounds.  Pulmonary:     Effort: Pulmonary effort is normal.     Breath sounds: Normal breath sounds.  Skin:    General: Skin is warm and dry.  Neurological:     General: No focal deficit present.     Mental Status: She is alert and oriented to person, place, and time.  Psychiatric:        Mood and Affect: Mood normal.        Behavior: Behavior normal.        Thought Content: Thought content normal.        Judgment: Judgment normal.       Assessment &  Plan:  1. Primary hypertension (Primary) - Will have her stop Norvasc  and hydrochlorothiazide  and place her on Tribenzor.  - Continue to monitor BP at home - Follow up in 30 days with blood pressure readings  - Olmesartan-amLODIPine -HCTZ 40-10-25 MG TABS; Take 1 tablet by mouth daily.  Dispense: 90 tablet; Refill: 0  I personally spent a total of 31 minutes in the  care of the patient today including preparing to see the patient, getting/reviewing separately obtained history, performing a medically appropriate exam/evaluation, counseling and educating, placing orders, and documenting clinical information in the EHR.

## 2024-07-26 NOTE — Telephone Encounter (Signed)
 FYI pt is coming in today

## 2024-08-02 ENCOUNTER — Telehealth: Payer: Self-pay

## 2024-08-02 DIAGNOSIS — I10 Essential (primary) hypertension: Secondary | ICD-10-CM

## 2024-08-02 DIAGNOSIS — F439 Reaction to severe stress, unspecified: Secondary | ICD-10-CM | POA: Diagnosis not present

## 2024-08-02 NOTE — Progress Notes (Signed)
 Complex Care Management Note  Care Guide Note 08/02/2024 Name: Prabhleen Montemayor Schussler MRN: [id number] DOB: [birth year]  Maureen Lewis is a 28 y.o. year old female who sees Nafziger, Darleene, NP for primary care. I reached out to [employer] by phone today to offer complex care management services.  [name] was given information about Complex Care Management services today including:   The Complex Care Management services include support from the care team which includes your Nurse Care Manager, Clinical Social Worker, or Pharmacist.  The Complex Care Management team is here to help remove barriers to the health concerns and goals most important to you. Complex Care Management services are voluntary, and the patient may decline or stop services at any time by request to their care team member.   Complex Care Management Consent Status: Patient agreed to services and verbal consent obtained.   Follow up plan:  Telephone appointment with complex care management team member scheduled for:  08/05/24 at 12:30 p.m.   Encounter Outcome:  Patient Scheduled  [facility]  [facility], [facility] VBCI Assistant Direct Dial : [phone]  Fax: [phone]

## 2024-08-05 ENCOUNTER — Telehealth: Payer: Self-pay

## 2024-08-05 NOTE — Patient Instructions (Signed)
 Visit Information  Thank you for taking time to visit with me today. Please don't hesitate to contact me if I can be of assistance to you before our next scheduled telephone appointment.  Our next appointment is by telephone on 08/09/2024 at 12;30pm  Following is a copy of your care plan:   Goals Addressed             This Visit's Progress    VBCI RN Care Plan       Problems:  Chronic Disease Management support and education needs related to HTN  Goal: Over the next 3 months the Patient will continue to work with RN Care Manager and/or Social Worker to address care management and care coordination needs related to HTN as evidenced by adherence to care management team scheduled appointments      Interventions:   Hypertension Interventions: Last practice recorded BP readings:  BP Readings from Last 3 Encounters:  07/26/24 (!) 160/90  07/21/24 135/78  06/02/24 126/82   Most recent eGFR/CrCl:  Lab Results  Component Value Date   EGFR 132 04/28/2024    No components found for: CRCL  Evaluation of current treatment plan related to hypertension self management and patient's adherence to plan as established by provider Provided education to patient re: stroke prevention, s/s of heart attack and stroke Reviewed medications with patient and discussed importance of compliance Counseled on the importance of exercise goals with target of 150 minutes per week Discussed plans with patient for ongoing care management follow up and provided patient with direct contact information for care management team Advised patient, providing education and rationale, to monitor blood pressure daily and record, calling PCP for findings outside established parameters Discussed complications of poorly controlled blood pressure such as heart disease, stroke, circulatory complications, vision complications, kidney impairment, sexual dysfunction Screening for signs and symptoms of depression related to  chronic disease state  Assessed social determinant of health barriers  Patient Self-Care Activities:  Attend all scheduled provider appointments Attend church or other social activities Call pharmacy for medication refills 3-7 days in advance of running out of medications Call provider office for new concerns or questions  Perform all self care activities independently  Perform IADL's (shopping, preparing meals, housekeeping, managing finances) independently Take medications as prescribed   Work with the social worker to address care coordination needs and will continue to work with the clinical team to address health care and disease management related needs check blood pressure daily learn about high blood pressure keep a blood pressure log take blood pressure log to all doctor appointments call doctor for signs and symptoms of high blood pressure keep all doctor appointments take medications for blood pressure exactly as prescribed begin an exercise program report new symptoms to your doctor eat more whole grains, fruits and vegetables, lean meats and healthy fats limit salt intake to 2300mg /day  Plan:  The patient has been provided with contact information for the care management team and has been advised to call with any health related questions or concerns.              Patient verbalizes understanding of instructions and care plan provided today and agrees to view in MyChart. Active MyChart status and patient understanding of how to access instructions and care plan via MyChart confirmed with patient.     The patient has been provided with contact information for the care management team and has been advised to call with any health related questions or concerns.  Please call the care guide team at [phone] if you need to cancel or reschedule your appointment.   Please call 1-800-273-TALK (toll free, 24 hour hotline) if you are experiencing a Mental Health or  Behavioral Health Crisis or need someone to talk to.  Krislynn Gronau A. Gordy RN, BA, [facility], CRRN [place]  [facility] Health RN Care Manager Direct Dial : [phone]  Fax: [phone]

## 2024-08-05 NOTE — Patient Outreach (Signed)
 08/05/2024    Called new referral for CCM program, [name] to complete initial assessment. Unable to complete initial assessment due to RNCM's difficulty with maintaing internet connection.  Scheduled to complete initial assessment next Tuesday, 08/09/2024 at 12:30 pm. Will discuss additional referral to LCSW at next engagement.   Maureen Lewis A. Gordy RN, BA, [facility], CRRN [place]  [facility] Health RN Care Manager Direct Dial : [phone]  Fax: [phone]

## 2024-08-06 DIAGNOSIS — Z6841 Body Mass Index (BMI) 40.0 and over, adult: Secondary | ICD-10-CM | POA: Diagnosis not present

## 2024-08-06 DIAGNOSIS — E611 Iron deficiency: Secondary | ICD-10-CM | POA: Diagnosis not present

## 2024-08-06 DIAGNOSIS — N914 Secondary oligomenorrhea: Secondary | ICD-10-CM | POA: Diagnosis not present

## 2024-08-06 DIAGNOSIS — R7303 Prediabetes: Secondary | ICD-10-CM | POA: Diagnosis not present

## 2024-08-06 DIAGNOSIS — E538 Deficiency of other specified B group vitamins: Secondary | ICD-10-CM | POA: Diagnosis not present

## 2024-08-06 DIAGNOSIS — M25571 Pain in right ankle and joints of right foot: Secondary | ICD-10-CM | POA: Diagnosis not present

## 2024-08-06 DIAGNOSIS — M79641 Pain in right hand: Secondary | ICD-10-CM | POA: Diagnosis not present

## 2024-08-06 DIAGNOSIS — M79642 Pain in left hand: Secondary | ICD-10-CM | POA: Diagnosis not present

## 2024-08-06 DIAGNOSIS — M51362 Other intervertebral disc degeneration, lumbar region with discogenic back pain and lower extremity pain: Secondary | ICD-10-CM | POA: Diagnosis not present

## 2024-08-06 DIAGNOSIS — G8929 Other chronic pain: Secondary | ICD-10-CM | POA: Diagnosis not present

## 2024-08-06 DIAGNOSIS — E559 Vitamin D deficiency, unspecified: Secondary | ICD-10-CM | POA: Diagnosis not present

## 2024-08-09 ENCOUNTER — Other Ambulatory Visit: Payer: Self-pay

## 2024-08-09 DIAGNOSIS — F439 Reaction to severe stress, unspecified: Secondary | ICD-10-CM | POA: Diagnosis not present

## 2024-08-09 DIAGNOSIS — Z6379 Other stressful life events affecting family and household: Secondary | ICD-10-CM | POA: Diagnosis not present

## 2024-08-10 ENCOUNTER — Encounter: Payer: Self-pay | Admitting: Adult Health

## 2024-08-10 ENCOUNTER — Other Ambulatory Visit: Payer: Self-pay | Admitting: Adult Health

## 2024-08-10 DIAGNOSIS — R519 Headache, unspecified: Secondary | ICD-10-CM

## 2024-08-10 MED ORDER — BUTALBITAL-APAP-CAFFEINE 50-325-40 MG PO TABS: 1.0000 | ORAL_TABLET | Freq: Four times a day (QID) | ORAL | 0 refills | Status: DC | PRN

## 2024-08-10 MED ORDER — RIZATRIPTAN BENZOATE 10 MG PO TABS: 10.0000 mg | ORAL_TABLET | ORAL | 0 refills | Status: DC | PRN

## 2024-08-10 NOTE — Telephone Encounter (Signed)
**Note De-identified  [place] Obfuscation** Please advise 

## 2024-08-16 ENCOUNTER — Other Ambulatory Visit: Payer: Self-pay

## 2024-08-16 ENCOUNTER — Other Ambulatory Visit

## 2024-08-16 ENCOUNTER — Ambulatory Visit: Payer: Self-pay | Admitting: Adult Health

## 2024-08-16 DIAGNOSIS — E611 Iron deficiency: Secondary | ICD-10-CM

## 2024-08-16 DIAGNOSIS — F439 Reaction to severe stress, unspecified: Secondary | ICD-10-CM | POA: Diagnosis not present

## 2024-08-16 LAB — IBC + FERRITIN
Ferritin: 65.1 ng/mL (ref 10.0–291.0)
Iron: 34 ug/dL — ABNORMAL LOW (ref 42–145)
Saturation Ratios: 9 % — ABNORMAL LOW (ref 20.0–50.0)
TIBC: 378 ug/dL (ref 250.0–450.0)
Transferrin: 270 mg/dL (ref 212.0–360.0)

## 2024-08-16 LAB — CBC
HCT: 37.1 % (ref 36.0–46.0)
Hemoglobin: 12 g/dL (ref 12.0–15.0)
MCHC: 32.3 g/dL (ref 30.0–36.0)
MCV: 76.1 fl — ABNORMAL LOW (ref 78.0–100.0)
Platelets: 230 K/uL (ref 150.0–400.0)
RBC: 4.88 Mil/uL (ref 3.87–5.11)
RDW: 14.3 % (ref 11.5–15.5)
WBC: 5.6 K/uL (ref 4.0–10.5)

## 2024-08-18 ENCOUNTER — Telehealth (INDEPENDENT_AMBULATORY_CARE_PROVIDER_SITE_OTHER): Admitting: Obstetrics and Gynecology

## 2024-08-18 DIAGNOSIS — N941 Unspecified dyspareunia: Secondary | ICD-10-CM

## 2024-08-18 DIAGNOSIS — G8929 Other chronic pain: Secondary | ICD-10-CM

## 2024-08-18 DIAGNOSIS — N920 Excessive and frequent menstruation with regular cycle: Secondary | ICD-10-CM | POA: Diagnosis not present

## 2024-08-18 DIAGNOSIS — R102 Pelvic and perineal pain unspecified side: Secondary | ICD-10-CM

## 2024-08-18 NOTE — Patient Instructions (Addendum)
 Maureen Lewis (elagolix) - medication used to treat painful periods due to endometriosis   Other procedure mentioned: endometrial ablation and uterine artery embolization

## 2024-08-18 NOTE — Progress Notes (Signed)
 GYNECOLOGY VIRTUAL VISIT ENCOUNTER NOTE  Provider location: Center for [facility] at [facility]   Patient location: Home  I connected with Maureen Lewis on 08/18/24 at  4:15 PM EDT by MyChart Video Encounter and verified that I am speaking with the correct person using two identifiers.   I discussed the limitations, risks, security and privacy concerns of performing an evaluation and management service virtually and the availability of in person appointments. I also discussed with the patient that there may be a patient responsible charge related to this service. The patient expressed understanding and agreed to proceed.   History:  Maureen Lewis is a 28 y.o. [phone] female being evaluated today for AUB follow up. Stopped taking aygestin  due to bleeding being worse. Currently bleeding is unchanged  1.5-2 weeks and other time sit lasts 5 days, wearing over night pads and changing ever 1.5 -2 hours for most of he cycle. Cramp is a week before cycle, during and after. Pain with itnercourse . Leaning towards having hysterectomy. Not particualry interested in getting procedures that may not work. Has completed childbearing.  May have had one after first baby - open to PFPT     Past Medical History:  Diagnosis Date   Abnormal uterine bleeding    Acid reflux    Alpha thalassemia trait    Amenorrhea    Anemia    no current med.   Asthma    Back pain    Chest pain    resolved   Complication of anesthesia    states had to keep giving her anesthesia during EGD   Constipation    Depression    I'm good   Fibromyalgia    Finger mass, left 03/2017   middle finger   Gestational hypertension 09/23/2019   Guidelines for Antenatal Testing and Sonography  (with updated ICD-10 codes)  Updated  Sep 14, 2019 with Dr. Fredia Fresh  INDICATION U/S 2 X week NST/AFI  or full BPP wkly DELIVERY Diabetes   A1 - good control - O24.410    A2 - good control - O24.419       A2  - poor control or poor compliance - O24.419, E11.65   (Macrosomia or polyhydramnios) **E11.65 is extra code for poor control**    A2/B - O24.   Headache    Hepatitis 07/18/2022   Hepatitis B   History of kidney stones    Hypertension    Irritable bowel syndrome (IBS)    Joint pain    Morbid obesity with body mass index (BMI) of 45.0 to 49.9 in adult [facility])    Plantar fasciitis, bilateral    resolved   Polycystic ovary syndrome    Pre-diabetes    no meds, diet controlled, diet not check blood sugar   Prediabetes 09/16/2017   Early 1hr 70     Shortness of breath    albuterol  inhaler   Sleep apnea    no CPAP use   Vertigo 2017   Past Surgical History:  Procedure Laterality Date   CESAREAN SECTION N/A 10/09/2019   Procedure: CESAREAN SECTION;  Surgeon: Izell Harari, MD;  Location: [facility];  Service: Obstetrics;  Laterality: N/A;   CESAREAN SECTION WITH BILATERAL TUBAL LIGATION N/A 12/01/2023   Procedure: CESAREAN SECTION WITH BILATERAL TUBAL LIGATION;  Surgeon: Kandis Devaughn Sayres, MD;  Location: [facility];  Service: Obstetrics;  Laterality: N/A;   COLONOSCOPY WITH PROPOFOL   10/07/2016   DILATION AND CURETTAGE  OF UTERUS     ESOPHAGOGASTRODUODENOSCOPY  12/21/2015   EXCISION MASS UPPER EXTREMETIES Left 04/21/2017   Procedure: EXCISION MASS LEFT MIDDLE FINGER;  Surgeon: Murrell Kuba, MD;  Location: [facility];  Service: Orthopedics;  Laterality: Left;   gastric sleeve  05/09/2022   IR FL GUIDED LOC OF NEEDLE/CATH TIP FOR SPINAL INJECTION RT  03/13/2020   KNEE ARTHROSCOPY WITH LATERAL MENISECTOMY Left 10/31/2022   Procedure: KNEE ARTHROSCOPY WITH PARTIAL LATERAL MENISECTOMY;  Surgeon: Sharl Selinda Dover, MD;  Location: [facility] ORS;  Service: Orthopedics;  Laterality: Left;  77   KNEE ARTHROSCOPY WITH LATERAL RELEASE Left 10/31/2022   Procedure: KNEE ARTHROSCOPY WITH LATERAL RELEASE AND LIMITED SYNOVECTOMY;  Surgeon: Sharl Selinda Dover, MD;  Location: [facility] ORS;   Service: Orthopedics;  Laterality: Left;  77   PHOTOCOAGULATION WITH LASER Left 12/13/2020   Procedure: RETINOPLEXY LEFT EYE;  Surgeon: Jarold Selinda, MD;  Location: [facility] OR;  Service: Ophthalmology;  Laterality: Left;   SCLERAL BUCKLE Right 12/13/2020   Procedure: SCLERAL BUCKLE WITH CRYO;  Surgeon: Jarold Selinda, MD;  Location: [facility] OR;  Service: Ophthalmology;  Laterality: Right;   The following portions of the patient's history were reviewed and updated as appropriate: allergies, current medications, past family history, past medical history, past social history, past surgical history and problem list.   Health Maintenance:      Component Value Date/Time   DIAGPAP (A) 04/27/2024 1703    - Atypical squamous cells of undetermined significance (ASC-US )   DIAGPAP (A) 05/07/2023 1137    - Atypical squamous cells of undetermined significance (ASC-US )   DIAGPAP  04/11/2020 1444    - Negative for intraepithelial lesion or malignancy (NILM)   HPVHIGH Positive (A) 04/27/2024 1703   HPVHIGH Positive (A) 05/07/2023 1137   HPVHIGH Negative 04/11/2020 1444   ADEQPAP  04/27/2024 1703    Satisfactory for evaluation; transformation zone component ABSENT.   ADEQPAP Satisfactory for evaluation.  The absence of an 05/07/2023 1137   ADEQPAP  05/07/2023 1137    endocervical/transformation zone component is not uncommon in pregnant   ADEQPAP patients. 05/07/2023 1137     Review of Systems:  Pertinent items noted in HPI and remainder of comprehensive ROS otherwise negative.  Physical Exam:   General:  Alert, oriented and cooperative. Patient appears to be in no acute distress.  Mental Status: Normal mood and affect. Normal behavior. Normal judgment and thought content.   Respiratory: Normal respiratory effort, no problems with respiration noted  Rest of physical exam deferred due to type of encounter  Labs and Imaging Results for orders placed or performed in visit on 08/16/24 (from the past 2  weeks)  CBC   Collection Time: 08/16/24 12:13 PM  Result Value Ref Range   WBC 5.6 4.0 - 10.5 K/uL   RBC 4.88 3.87 - 5.11 Mil/uL   Platelets 230.0 150.0 - 400.0 K/uL   Hemoglobin 12.0 12.0 - 15.0 g/dL   HCT 62.8 63.9 - 53.9 %   MCV 76.1 (L) 78.0 - 100.0 fl   MCHC 32.3 30.0 - 36.0 g/dL   RDW 85.6 88.4 - 84.4 %  IBC + Ferritin   Collection Time: 08/16/24 12:13 PM  Result Value Ref Range   Iron  34 (L) 42 - 145 ug/dL   Transferrin 729.9 787.9 - 360.0 mg/dL   Saturation Ratios 9.0 (L) 20.0 - 50.0 %   Ferritin 65.1 10.0 - 291.0 ng/mL   TIBC 378.0 250.0 - 450.0 mcg/dL   *Note: Due to  a large number of results and/or encounters for the requested time period, some results have not been displayed. A complete set of results can be found in Results Review.   No results found.     Assessment and Plan:     1. Dyspareunia in female (Primary) 2. Chronic pelvic pain in female Offered and accepted PFPT. disc - Ambulatory referral to Physical Therapy  3. Menorrhagia with regular cycle Reviewed options for management of bleeding. Discussed medical and surgical management of dysmenorrhea and AUB. Given additional information. Leaning towards most definitive management with hysterectomy.        I discussed the assessment and treatment plan with the patient. The patient was provided an opportunity to ask questions and all were answered. The patient agreed with the plan and demonstrated an understanding of the instructions.   The patient was advised to call back or seek an in-person evaluation/go to the ED if the symptoms worsen or if the condition fails to improve as anticipated.  I provided 10 minutes of face-to-face time during this encounter. I also spent 10 minutes dedicated to the care of this patient including pre-visit review of records, post visit ordering of medications and appropriate tests or procedures, coordinating care and documenting this visit encounter.    Carter Quarry,  MD Center for [employer], [facility]

## 2024-08-25 ENCOUNTER — Other Ambulatory Visit: Payer: Self-pay

## 2024-08-25 NOTE — Patient Outreach (Signed)
 08/25/2024  Patient unable to talk when called today by RNCM: patient requested a later-in-the-afternoon appt, preferably around 3 pm - unable to accommodate patient today but rescheduled appt for 08/30/24 at [facility] A. Gordy RN, BA, [facility], CRRN [place]  [facility] RN Care Manager Direct Dial : [phone]  Fax: [phone]

## 2024-08-30 ENCOUNTER — Other Ambulatory Visit: Payer: Self-pay

## 2024-08-30 NOTE — Patient Outreach (Signed)
 Complex Care Management   Visit Note  08/30/2024  Name:  Maureen Lewis MRN: [id number] DOB: 03-Sep-1996  Situation: Referral received for Complex Care Management related to Hypertension. I obtained verbal consent from Patient.  Visit completed with Patient  on the phone  Background:   Past Medical History:  Diagnosis Date   Abnormal uterine bleeding    Acid reflux    Alpha thalassemia trait    Amenorrhea    Anemia    no current med.   Asthma    Back pain    Chest pain    resolved   Complication of anesthesia    states had to keep giving her anesthesia during EGD   Constipation    Depression    I'm good   Fibromyalgia    Finger mass, left 03/2017   middle finger   Gestational hypertension 09/23/2019   Guidelines for Antenatal Testing and Sonography  (with updated ICD-10 codes)  Updated  09/25/2019 with Dr. Fredia Fresh  INDICATION U/S 2 X week NST/AFI  or full BPP wkly DELIVERY Diabetes   A1 - good control - O24.410    A2 - good control - O24.419      A2  - poor control or poor compliance - O24.419, E11.65   (Macrosomia or polyhydramnios) **E11.65 is extra code for poor control**    A2/B - O24.   Headache    Hepatitis 07/18/2022   Hepatitis B   History of kidney stones    Hypertension    Irritable bowel syndrome (IBS)    Joint pain    Morbid obesity with body mass index (BMI) of 45.0 to 49.9 in adult [facility])    Plantar fasciitis, bilateral    resolved   Polycystic ovary syndrome    Pre-diabetes    no meds, diet controlled, diet not check blood sugar   Prediabetes 09/16/2017   Early 1hr 70     Shortness of breath    albuterol  inhaler   Sleep apnea    no CPAP use   Vertigo 2017    Assessment: Patient Reported Symptoms:  Cognitive Cognitive Status: No symptoms reported, Alert and oriented to person, place, and time, Insightful and able to interpret abstract concepts, Normal speech and language skills Cognitive/Intellectual Conditions Management [RPT]:  None reported or documented in medical history or problem list   Health Maintenance Behaviors: Stress management, Annual physical exam, Healthy diet Healing Pattern: Unsure Health Facilitated by: Healthy diet, Stress management  Neurological Neurological Review of Symptoms: No symptoms reported    HEENT HEENT Symptoms Reported: No symptoms reported      Cardiovascular Cardiovascular Symptoms Reported: Palpitations (occasionally has palpitations but states it usually occurs when her BP occasionally is higher than normal, MD aware) Does patient have uncontrolled Hypertension?: No (did have uncontrolled HTN, HTN better controlled now with antihypertensive med) Cardiovascular Management Strategies: Medication therapy, Routine screening, Diet modification, Coping strategies, Weight management, Activity, Adequate rest Do You Have a Working Readable Scale?: Yes Cardiovascular Self-Management Outcome: 4 (good) Cardiovascular Comment: Taking Olmesartan-Amlodipine -HCTZ 40-10-25 mg tabs daily for HTN control  Respiratory Respiratory Symptoms Reported: No symptoms reported    Endocrine Endocrine Symptoms Reported: No symptoms reported Is patient diabetic?: No (was diagnosed with gestational diabetes only) Endocrine Self-Management Outcome: 4 (good)  Gastrointestinal Gastrointestinal Symptoms Reported: No symptoms reported Additional Gastrointestinal Details: s/p laparoscopic sleeve gastrectomy, suffers from obesity stage 3 - presently on Zepbound 5mg  weekly for weight management Gastrointestinal Management Strategies: Medication therapy, Diet modification, Coping strategies  Gastrointestinal Self-Management Outcome: 3 (uncertain)    Genitourinary Genitourinary Symptoms Reported: No symptoms reported    Integumentary Integumentary Symptoms Reported: No symptoms reported    Musculoskeletal Musculoskelatal Symptoms Reviewed: No symptoms reported        Psychosocial Psychosocial Symptoms Reported:  Depression - if selected complete PHQ 2-9 Behavioral Management Strategies: Medication therapy, Coping strategies, Counseling Behavioral Health Self-Management Outcome: 4 (good) Major Change/Loss/Stressor/Fears (CP): Medical condition, self Techniques to Cope with Loss/Stress/Change: Counseling, Medication, Diversional activities Do you feel physically threatened by others?: No    08/30/2024    PHQ2-9 Depression Screening   Little interest or pleasure in doing things Not at all  Feeling down, depressed, or hopeless Not at all  PHQ-2 - Total Score 0  Trouble falling or staying asleep, or sleeping too much    Feeling tired or having little energy    Poor appetite or overeating     Feeling bad about yourself - or that you are a failure or have let yourself or your family down    Trouble concentrating on things, such as reading the newspaper or watching television    Moving or speaking so slowly that other people could have noticed.  Or the opposite - being so fidgety or restless that you have been moving around a lot more than usual    Thoughts that you would be better off dead, or hurting yourself in some way    PHQ2-9 Total Score    If you checked off any problems, how difficult have these problems made it for you to do your work, take care of things at home, or get along with other people    Depression Interventions/Treatment      There were no vitals filed for this visit. Pain Scale: 0-10 Pain Score: 0-No pain  Medications Reviewed Today     Reviewed by Gordy Channing LABOR, RN (Registered Nurse) on 08/30/24 at 1529  Med List Status: <None>   Medication Order Taking? Sig Documenting Provider Last Dose Status Informant  butalbital -acetaminophen -caffeine  (FIORICET) 50-325-40 MG tablet [id number] Yes Take 1 tablet by mouth every 6 (six) hours as needed for headache. Nafziger, Darleene, NP  Active   norethindrone  (AYGESTIN ) 5 MG tablet [id number]  Take 2 tablets (10 mg total) by mouth daily.   Patient not taking: Reported on 08/30/2024   Ajewole, Christana, MD  Active   Olmesartan-amLODIPine -HCTZ 40-10-25 MG TABS [id number] Yes Take 1 tablet by mouth daily. Nafziger, Darleene, NP  Active   oxyCODONE -acetaminophen  (PERCOCET/ROXICET) 5-325 MG tablet [id number] Yes Take 1 tablet by mouth every 6 (six) hours as needed. [provider]  Active   pregabalin (LYRICA) 100 MG capsule [id number] Yes Take 100 mg by mouth at bedtime. [provider]  Active   Prenatal Vit-Fe Phos-FA-Omega (VITAFOL  GUMMIES) 3.33-0.333-34.8 MG CHEW [id number] Yes Chew 1 tablet by mouth daily. Zina Jerilynn LABOR, MD  Active   rizatriptan (MAXALT) 10 MG tablet [id number] Yes Take 1 tablet (10 mg total) by mouth as needed for migraine. May repeat in 2 hours if needed Nafziger, Cory, NP  Active   tirzepatide [facility]) 5 MG/0.5ML Pen [id number] Yes Inject 5 mg into the skin. [provider]  Active   tiZANidine  (ZANAFLEX ) 4 MG tablet [id number] Yes Take 4 mg by mouth 3 (three) times daily as needed. [provider]  Active   Vitamin D , Ergocalciferol , (DRISDOL ) 1.25 MG (50000 UNIT) CAPS capsule [id number] Yes TAKE 1 CAPSULE BY MOUTH EVERY 7 DAYS Walker, Jamilla R, CNM  Active  WEGOVY  1 MG/0.5ML SOAJ [id number] Yes Inject 1 mg into the skin once a week. [provider]  Active             Recommendation:   Continue Current Plan of Care  Follow Up Plan:   Telephone follow up appointment date/time:  09/27/2024 at 3pm with RN Care Manager Channing LITTIE Channing A. Gordy RN, BA, [facility] CRRN [place]  [facility] Health RN Care Manager Direct Dial : [phone]  Fax: [phone]

## 2024-08-30 NOTE — Patient Instructions (Signed)
 Visit Information  Thank you for taking time to visit with me today. Please don't hesitate to contact me if I can be of assistance to you before our next scheduled telephone appointment.  Our next appointment is by telephone on 09/27/2024 at 3pm  Following is a copy of your care plan:   Goals Addressed             This Visit's Progress    VBCI RN Care Plan       Problems:  Chronic Disease Management support and education needs related to HTN  Goal: Over the next 3 months the Patient will continue to work with RN Care Manager and/or Social Worker to address care management and care coordination needs related to HTN as evidenced by adherence to care management team scheduled appointments      Interventions:   Hypertension Interventions: Last practice recorded BP readings:  BP Readings from Last 3 Encounters:  07/26/24 (!) 160/90  07/21/24 135/78  06/02/24 126/82   Most recent eGFR/CrCl:  Lab Results  Component Value Date   EGFR 132 04/28/2024    No components found for: CRCL  Evaluation of current treatment plan related to hypertension self management and patient's adherence to plan as established by provider Provided education to patient re: stroke prevention, s/s of heart attack and stroke Reviewed medications with patient and discussed importance of compliance Counseled on the importance of exercise goals with target of 150 minutes per week Discussed plans with patient for ongoing care management follow up and provided patient with direct contact information for care management team Advised patient, providing education and rationale, to monitor blood pressure daily and record, calling PCP for findings outside established parameters Discussed complications of poorly controlled blood pressure such as heart disease, stroke, circulatory complications, vision complications, kidney impairment, sexual dysfunction Screening for signs and symptoms of depression related to chronic  disease state  Assessed social determinant of health barriers  Patient Self-Care Activities:  Attend all scheduled provider appointments Attend church or other social activities Call pharmacy for medication refills 3-7 days in advance of running out of medications Call provider office for new concerns or questions  Perform all self care activities independently  Perform IADL's (shopping, preparing meals, housekeeping, managing finances) independently Take medications as prescribed   Work with the social worker to address care coordination needs and will continue to work with the clinical team to address health care and disease management related needs check blood pressure daily learn about high blood pressure keep a blood pressure log take blood pressure log to all doctor appointments call doctor for signs and symptoms of high blood pressure keep all doctor appointments take medications for blood pressure exactly as prescribed begin an exercise program report new symptoms to your doctor eat more whole grains, fruits and vegetables, lean meats and healthy fats limit salt intake to 1500mg /day Currently taking Olmesartan-Amlodipine -hydrochlorothiazide  40-10-25 mg daily for HTN control and is adherent to taking as directed and checks her BP twice daily  Plan:  The patient has been provided with contact information for the care management team and has been advised to call with any health related questions or concerns.  Next appointment with RN Care Manager Maureen Lewis is 09/27/24 at 3pm             Care plan and visit instructions communicated with the patient verbally today. Patient agrees to receive a copy in MyChart. Active MyChart status and patient understanding of how to access instructions and care  plan via MyChart confirmed with patient.     The patient has been provided with contact information for the care management team and has been advised to call with any health related  questions or concerns.   Please call the care guide team at [phone] if you need to cancel or reschedule your appointment.   Please call 1-800-273-TALK (toll free, 24 hour hotline) if you are experiencing a Mental Health or Behavioral Health Crisis or need someone to talk to.  Maureen Lewis A. Gordy RN, BA, [facility], CRRN [place]  [facility] RN Care Manager Direct Dial : [phone]  Fax: [phone]

## 2024-08-31 DIAGNOSIS — F439 Reaction to severe stress, unspecified: Secondary | ICD-10-CM | POA: Diagnosis not present

## 2024-09-03 DIAGNOSIS — M25571 Pain in right ankle and joints of right foot: Secondary | ICD-10-CM | POA: Diagnosis not present

## 2024-09-03 DIAGNOSIS — E538 Deficiency of other specified B group vitamins: Secondary | ICD-10-CM | POA: Diagnosis not present

## 2024-09-03 DIAGNOSIS — N914 Secondary oligomenorrhea: Secondary | ICD-10-CM | POA: Diagnosis not present

## 2024-09-03 DIAGNOSIS — Z6841 Body Mass Index (BMI) 40.0 and over, adult: Secondary | ICD-10-CM | POA: Diagnosis not present

## 2024-09-03 DIAGNOSIS — M79642 Pain in left hand: Secondary | ICD-10-CM | POA: Diagnosis not present

## 2024-09-03 DIAGNOSIS — E559 Vitamin D deficiency, unspecified: Secondary | ICD-10-CM | POA: Diagnosis not present

## 2024-09-03 DIAGNOSIS — M51362 Other intervertebral disc degeneration, lumbar region with discogenic back pain and lower extremity pain: Secondary | ICD-10-CM | POA: Diagnosis not present

## 2024-09-03 DIAGNOSIS — R7303 Prediabetes: Secondary | ICD-10-CM | POA: Diagnosis not present

## 2024-09-03 DIAGNOSIS — Z79899 Other long term (current) drug therapy: Secondary | ICD-10-CM | POA: Diagnosis not present

## 2024-09-03 DIAGNOSIS — E611 Iron deficiency: Secondary | ICD-10-CM | POA: Diagnosis not present

## 2024-09-03 DIAGNOSIS — M79641 Pain in right hand: Secondary | ICD-10-CM | POA: Diagnosis not present

## 2024-09-06 DIAGNOSIS — F439 Reaction to severe stress, unspecified: Secondary | ICD-10-CM | POA: Diagnosis not present

## 2024-09-06 DIAGNOSIS — Z79899 Other long term (current) drug therapy: Secondary | ICD-10-CM | POA: Diagnosis not present

## 2024-09-14 DIAGNOSIS — F439 Reaction to severe stress, unspecified: Secondary | ICD-10-CM | POA: Diagnosis not present

## 2024-09-23 DIAGNOSIS — R4581 Low self-esteem: Secondary | ICD-10-CM | POA: Diagnosis not present

## 2024-09-23 DIAGNOSIS — Z563 Stressful work schedule: Secondary | ICD-10-CM | POA: Diagnosis not present

## 2024-09-23 DIAGNOSIS — F4323 Adjustment disorder with mixed anxiety and depressed mood: Secondary | ICD-10-CM | POA: Diagnosis not present

## 2024-09-23 DIAGNOSIS — R454 Irritability and anger: Secondary | ICD-10-CM | POA: Diagnosis not present

## 2024-09-27 ENCOUNTER — Other Ambulatory Visit

## 2024-10-01 DIAGNOSIS — N914 Secondary oligomenorrhea: Secondary | ICD-10-CM | POA: Diagnosis not present

## 2024-10-01 DIAGNOSIS — Z6841 Body Mass Index (BMI) 40.0 and over, adult: Secondary | ICD-10-CM | POA: Diagnosis not present

## 2024-10-01 DIAGNOSIS — M51362 Other intervertebral disc degeneration, lumbar region with discogenic back pain and lower extremity pain: Secondary | ICD-10-CM | POA: Diagnosis not present

## 2024-10-01 DIAGNOSIS — Z79899 Other long term (current) drug therapy: Secondary | ICD-10-CM | POA: Diagnosis not present

## 2024-10-01 DIAGNOSIS — M25571 Pain in right ankle and joints of right foot: Secondary | ICD-10-CM | POA: Diagnosis not present

## 2024-10-05 DIAGNOSIS — F439 Reaction to severe stress, unspecified: Secondary | ICD-10-CM | POA: Diagnosis not present

## 2024-10-06 DIAGNOSIS — Z79899 Other long term (current) drug therapy: Secondary | ICD-10-CM | POA: Diagnosis not present

## 2024-10-07 DIAGNOSIS — Z6379 Other stressful life events affecting family and household: Secondary | ICD-10-CM | POA: Diagnosis not present

## 2024-10-07 DIAGNOSIS — F4323 Adjustment disorder with mixed anxiety and depressed mood: Secondary | ICD-10-CM | POA: Diagnosis not present

## 2024-10-11 DIAGNOSIS — F439 Reaction to severe stress, unspecified: Secondary | ICD-10-CM | POA: Diagnosis not present

## 2024-10-18 ENCOUNTER — Other Ambulatory Visit: Payer: Self-pay | Admitting: *Deleted

## 2024-10-18 ENCOUNTER — Encounter: Payer: Self-pay | Admitting: *Deleted

## 2024-10-18 ENCOUNTER — Other Ambulatory Visit

## 2024-10-18 NOTE — Patient Instructions (Signed)
 Maureen Lewis - I am sorry I was unable to reach you today for our scheduled appointment. I work with Merna, Darleene, NP and am calling to support your healthcare needs. I have scheduled another telephone call for 10/26/24 at 2:30. I look forward to speaking with you soon.   Thank you,   Josette Pellet, RN, BSN [place]  [facility] Health RN Care Manager Direct Dial : [phone]  Fax: [phone]

## 2024-10-21 ENCOUNTER — Encounter: Payer: Self-pay | Admitting: Adult Health

## 2024-10-25 DIAGNOSIS — R519 Headache, unspecified: Secondary | ICD-10-CM

## 2024-10-25 MED ORDER — BUTALBITAL-APAP-CAFFEINE 50-325-40 MG PO TABS: 1.0000 | ORAL_TABLET | Freq: Four times a day (QID) | ORAL | 2 refills | Status: AC | PRN

## 2024-10-25 MED ORDER — RIZATRIPTAN BENZOATE 10 MG PO TABS: 10.0000 mg | ORAL_TABLET | ORAL | 2 refills | Status: AC | PRN

## 2024-10-26 ENCOUNTER — Other Ambulatory Visit: Payer: Self-pay | Admitting: *Deleted

## 2024-10-26 ENCOUNTER — Encounter: Payer: Self-pay | Admitting: *Deleted

## 2024-10-28 ENCOUNTER — Other Ambulatory Visit: Payer: Self-pay | Admitting: Adult Health

## 2024-10-28 DIAGNOSIS — I10 Essential (primary) hypertension: Secondary | ICD-10-CM

## 2024-11-03 ENCOUNTER — Encounter (INDEPENDENT_AMBULATORY_CARE_PROVIDER_SITE_OTHER): Payer: Self-pay

## 2024-11-04 ENCOUNTER — Ambulatory Visit: Admitting: Adult Health

## 2024-11-04 VITALS — BP 130/80 | Temp 98.2°F | Ht 74.0 in | Wt 379.0 lb

## 2024-11-04 DIAGNOSIS — G43E01 Chronic migraine with aura, not intractable, with status migrainosus: Secondary | ICD-10-CM

## 2024-11-04 MED ORDER — AMITRIPTYLINE HCL 50 MG PO TABS: 50.0000 mg | ORAL_TABLET | Freq: Every day | ORAL | 0 refills | Status: AC

## 2024-11-04 NOTE — Progress Notes (Signed)
 "  Subjective:    Patient ID: Maureen Lewis, female    DOB: 12-Jul-1996, 29 y.o.   MRN: [id number]  Headache    Discussed the use of AI scribe software for clinical note transcription with the patient, who gave verbal consent to proceed.  History of Present Illness   Maureen Lewis is a 29 year old female with migraines who presents with worsening headaches.  She reports longstanding migraines that have recently become more severe and now occur multiple times per day, involving eye pain and visual disturbance and sometimes preventing sleep and normal activities. She uses Maxalt  and Fioricet. She previously tried Topamax  up to 50 mg without benefit and took nightly Elavil  in 2021-2022 with good control, which she likely stopped due to pregnancy.  She had a prior head MRI and eye exam, both reported as normal.        Review of Systems  Neurological:  Positive for headaches.   See HPI   Past Medical History:  Diagnosis Date   Abnormal uterine bleeding    Acid reflux    Alpha thalassemia trait    Amenorrhea    Anemia    no current med.   Asthma    Back pain    Chest pain    resolved   Complication of anesthesia    states had to keep giving her anesthesia during EGD   Constipation    Depression    I'm good   Fibromyalgia    Finger mass, left 03/2017   middle finger   Gestational hypertension 09/23/2019   Guidelines for Antenatal Testing and Sonography  (with updated ICD-10 codes)  Updated  2019/09/13 with Dr. Fredia Fresh  INDICATION U/S 2 X week NST/AFI  or full BPP wkly DELIVERY Diabetes   A1 - good control - O24.410    A2 - good control - O24.419      A2  - poor control or poor compliance - O24.419, E11.65   (Macrosomia or polyhydramnios) **E11.65 is extra code for poor control**    A2/B - O24.   Headache    Hepatitis 07/18/2022   Hepatitis B   History of kidney stones    Hypertension    Irritable bowel syndrome (IBS)    Joint pain    Morbid  obesity with body mass index (BMI) of 45.0 to 49.9 in adult [facility])    Plantar fasciitis, bilateral    resolved   Polycystic ovary syndrome    Pre-diabetes    no meds, diet controlled, diet not check blood sugar   Prediabetes 09/16/2017   Early 1hr 70     Shortness of breath    albuterol  inhaler   Sleep apnea    no CPAP use   Vertigo 2017    Social History   Socioeconomic History   Marital status: Single    Spouse name: Not on file   Number of children: Not on file   Years of education: Not on file   Highest education level: 12th grade  Occupational History   Occupation: Training And Development Officer: [id number]  Tobacco Use   Smoking status: Never   Smokeless tobacco: Never  Vaping Use   Vaping status: Never Used  Substance and Sexual Activity   Alcohol use: Not Currently    Comment: social    Drug use: No   Sexual activity: Not Currently    Birth control/protection: None  Other Topics Concern   Not on file  Social History Narrative   Right handed    Soda sometimes   Lives with mom and cousin, a grandmother in [place] also helps care for her.       She is in nursing school.    Social Drivers of Health   Tobacco Use: Low Risk (11/03/2024)   Received from [facility]   Patient History    Smoking Tobacco Use: Never    Smokeless Tobacco Use: Never    Passive Exposure: Never  Financial Resource Strain: Low Risk (11/03/2024)   Received from [facility]   Overall Financial Resource Strain (CARDIA)    How hard is it for you to pay for the very basics like food, housing, medical care, and heating?: Not hard at all  Food Insecurity: No Food Insecurity (11/03/2024)   Received from [facility]   Epic    Within the past 12 months, you worried that your food would run out before you got the money to buy more.: Never true    Within the past 12 months, the food you bought just didn't last and you didn't have money to get more.: Never true  Transportation Needs: No  Transportation Needs (11/03/2024)   Received from [facility]    In the past 12 months, has lack of transportation kept you from medical appointments or from getting medications?: No    In the past 12 months, has lack of transportation kept you from meetings, work, or from getting things needed for daily living?: No  Physical Activity: Insufficiently Active (05/17/2024)   Exercise Vital Sign    Days of Exercise per Week: 4 days    Minutes of Exercise per Session: 30 min  Stress: Stress Concern Present (05/17/2024)   [employer] of Occupational Health - Occupational Stress Questionnaire    Feeling of Stress: To some extent  Social Connections: Moderately Isolated (05/17/2024)   Social Connection and Isolation Panel    Frequency of Communication with Friends and Family: More than three times a week    Frequency of Social Gatherings with Friends and Family: Three times a week    Attends Religious Services: More than 4 times per year    Active Member of Clubs or Organizations: No    Attends Banker Meetings: Not on file    Marital Status: Never married  Intimate Partner Violence: Not At Risk (12/04/2023)   Humiliation, Afraid, Rape, and Kick questionnaire    Fear of Current or Ex-Partner: No    Emotionally Abused: No    Physically Abused: No    Sexually Abused: No  Depression (PHQ2-9): Low Risk (08/30/2024)   Depression (PHQ2-9)    PHQ-2 Score: 0  Recent Concern: Depression (PHQ2-9) - Medium Risk (07/21/2024)   Depression (PHQ2-9)    PHQ-2 Score: 10  Alcohol Screen: Low Risk (05/17/2024)   Alcohol Screen    Last Alcohol Screening Score (AUDIT): 1  Housing: Low Risk (11/03/2024)   Received from [facility]    In the last 12 months, was there a time when you were not able to pay the mortgage or rent on time?: No    In the past 12 months, how many times have you moved where you were living?: 1    At any time in the past 12 months, were you homeless or  living in a shelter (including now)?: No  Utilities: Not At Risk (11/03/2024)   Received from [facility]    In  the past 12 months has the electric, gas, oil, or water  company threatened to shut off services in your home?: No  Health Literacy: Not on file    Past Surgical History:  Procedure Laterality Date   CESAREAN SECTION N/A 10/09/2019   Procedure: CESAREAN SECTION;  Surgeon: Izell Harari, MD;  Location: [facility];  Service: Obstetrics;  Laterality: N/A;   CESAREAN SECTION WITH BILATERAL TUBAL LIGATION N/A 12/01/2023   Procedure: CESAREAN SECTION WITH BILATERAL TUBAL LIGATION;  Surgeon: Kandis Devaughn Sayres, MD;  Location: [facility];  Service: Obstetrics;  Laterality: N/A;   COLONOSCOPY WITH PROPOFOL   10/07/2016   DILATION AND CURETTAGE OF UTERUS     ESOPHAGOGASTRODUODENOSCOPY  12/21/2015   EXCISION MASS UPPER EXTREMETIES Left 04/21/2017   Procedure: EXCISION MASS LEFT MIDDLE FINGER;  Surgeon: Murrell Kuba, MD;  Location: [facility];  Service: Orthopedics;  Laterality: Left;   gastric sleeve  05/09/2022   IR FL GUIDED LOC OF NEEDLE/CATH TIP FOR SPINAL INJECTION RT  03/13/2020   KNEE ARTHROSCOPY WITH LATERAL MENISECTOMY Left 10/31/2022   Procedure: KNEE ARTHROSCOPY WITH PARTIAL LATERAL MENISECTOMY;  Surgeon: Sharl Selinda Dover, MD;  Location: [facility];  Service: Orthopedics;  Laterality: Left;  77   KNEE ARTHROSCOPY WITH LATERAL RELEASE Left 10/31/2022   Procedure: KNEE ARTHROSCOPY WITH LATERAL RELEASE AND LIMITED SYNOVECTOMY;  Surgeon: Sharl Selinda Dover, MD;  Location: [facility];  Service: Orthopedics;  Laterality: Left;  77   PHOTOCOAGULATION WITH LASER Left 12/13/2020   Procedure: RETINOPLEXY LEFT EYE;  Surgeon: Jarold Selinda, MD;  Location: [facility] OR;  Service: Ophthalmology;  Laterality: Left;   SCLERAL BUCKLE Right 12/13/2020   Procedure: SCLERAL BUCKLE WITH CRYO;  Surgeon: Jarold Selinda, MD;  Location: [facility] OR;  Service: Ophthalmology;  Laterality: Right;     Family History  Problem Relation Age of Onset   Diabetes Maternal Aunt    Hypertension Maternal Uncle    Depression Maternal Grandfather    Diabetes Maternal Grandfather    Hypertension Maternal Grandfather    Arthritis Mother    High blood pressure Mother    Depression Mother    Sleep apnea Mother    Obesity Mother    Diabetes Mother    Hypertension Mother    Kidney failure Mother    Sleep apnea Father    Obesity Father    Healthy Daughter     Allergies[1]  Medications Ordered Prior to Encounter[2]  BP 130/80   Temp 98.2 F (36.8 C) (Oral)   Ht 6' 2 (1.88 m)   Wt (!) 379 lb (171.9 kg)   LMP 10/31/2024 (Exact Date)   BMI 48.66 kg/m       Objective:   Physical Exam Vitals and nursing note reviewed.  Cardiovascular:     Rate and Rhythm: Normal rate and regular rhythm.     Pulses: Normal pulses.     Heart sounds: Normal heart sounds.  Pulmonary:     Effort: Pulmonary effort is normal.  Musculoskeletal:        General: Normal range of motion.  Skin:    General: Skin is warm and dry.  Neurological:     General: No focal deficit present.     Mental Status: She is alert and oriented to person, place, and time.  Psychiatric:        Mood and Affect: Mood normal.        Behavior: Behavior normal.        Thought Content: Thought content normal.  Judgment: Judgment normal.           Assessment & Plan:   Assessment and Plan    Chronic migraine with aura and status migrainosus Chronic migraines with aura have increased in frequency and severity. Previous treatments included Topamax  and amitriptyline , with amitriptyline  being effective. MRI of the head was normal.  - Restart amitriptyline  50 mg at bedtime. - Consider adding Topamax  if amitriptyline  is insufficient.      Adaisha Campise, NP   I personally spent a total of 32 minutes in the care of the patient today including preparing to see the patient, getting/reviewing separately obtained  history, performing a medically appropriate exam/evaluation, counseling and educating, placing orders, and documenting clinical information in the EHR.     [1]  Allergies Allergen Reactions   Bee Venom Hives, Shortness Of Breath and Swelling   Hydrocodone -Acetaminophen  Anaphylaxis    No problem when she takes Tylenol    Peach Flavoring Agent (Non-Screening) Hives, Shortness Of Breath and Other (See Comments)    SWELLING OF MOUTH   Pollen Extract Hives, Shortness Of Breath and Swelling   Remdesivir  Swelling    Angioedema 8/25   Latex Rash   Cortisone Itching and Swelling   Cortizone-10 [Hydrocortisone ]     Swelling and itching   Toradol  [Ketorolac  Tromethamine ] Swelling  [2]  Current Outpatient Medications on File Prior to Visit  Medication Sig Dispense Refill   butalbital -acetaminophen -caffeine  (FIORICET) 50-325-40 MG tablet Take 1 tablet by mouth every 6 (six) hours as needed for headache. 14 tablet 2   norethindrone  (AYGESTIN ) 5 MG tablet Take 2 tablets (10 mg total) by mouth daily. 60 tablet 2   Olmesartan -amLODIPine -HCTZ 40-10-25 MG TABS TAKE 1 TABLET BY MOUTH DAILY 90 tablet 0   oxyCODONE -acetaminophen  (PERCOCET/ROXICET) 5-325 MG tablet Take 1 tablet by mouth every 6 (six) hours as needed.     pregabalin (LYRICA) 100 MG capsule Take 100 mg by mouth at bedtime.     Prenatal Vit-Fe Phos-FA-Omega (VITAFOL  GUMMIES) 3.33-0.333-34.8 MG CHEW Chew 1 tablet by mouth daily. 90 tablet 3   rizatriptan  (MAXALT ) 10 MG tablet Take 1 tablet (10 mg total) by mouth as needed for migraine. May repeat in 2 hours if needed 10 tablet 2   tirzepatide (ZEPBOUND) 5 MG/0.5ML Pen Inject 5 mg into the skin.     tiZANidine  (ZANAFLEX ) 4 MG tablet Take 4 mg by mouth 3 (three) times daily as needed.     Vitamin D , Ergocalciferol , (DRISDOL ) 1.25 MG (50000 UNIT) CAPS capsule TAKE 1 CAPSULE BY MOUTH EVERY 7 DAYS 10 capsule 0   WEGOVY  1 MG/0.5ML SOAJ Inject 1 mg into the skin once a week.     No current  facility-administered medications on file prior to visit.   "

## 2024-11-15 ENCOUNTER — Telehealth: Payer: Self-pay

## 2024-11-22 ENCOUNTER — Other Ambulatory Visit: Payer: Self-pay | Admitting: Medical Genetics

## 2024-11-30 ENCOUNTER — Telehealth: Payer: Self-pay | Admitting: Family Medicine

## 2024-12-01 ENCOUNTER — Other Ambulatory Visit (HOSPITAL_COMMUNITY)

## 2024-12-14 ENCOUNTER — Encounter: Admitting: Adult Health

## 2025-01-17 ENCOUNTER — Ambulatory Visit: Admitting: Cardiology
